# Patient Record
Sex: Female | Born: 1945 | Hispanic: No | Marital: Single | Smoking: Former smoker
Health system: Southern US, Community
[De-identification: ages and names within clinical notes are randomized; demographics above are authoritative.]

## PROBLEM LIST (undated history)

## (undated) DIAGNOSIS — G8929 Other chronic pain: Secondary | ICD-10-CM

## (undated) DIAGNOSIS — F32A Depression, unspecified: Secondary | ICD-10-CM

## (undated) DIAGNOSIS — M797 Fibromyalgia: Secondary | ICD-10-CM

## (undated) DIAGNOSIS — K5790 Diverticulosis of intestine, part unspecified, without perforation or abscess without bleeding: Secondary | ICD-10-CM

## (undated) DIAGNOSIS — J45909 Unspecified asthma, uncomplicated: Secondary | ICD-10-CM

## (undated) DIAGNOSIS — M5126 Other intervertebral disc displacement, lumbar region: Secondary | ICD-10-CM

## (undated) DIAGNOSIS — E785 Hyperlipidemia, unspecified: Secondary | ICD-10-CM

## (undated) DIAGNOSIS — K219 Gastro-esophageal reflux disease without esophagitis: Secondary | ICD-10-CM

## (undated) DIAGNOSIS — R911 Solitary pulmonary nodule: Secondary | ICD-10-CM

## (undated) DIAGNOSIS — K589 Irritable bowel syndrome without diarrhea: Secondary | ICD-10-CM

## (undated) DIAGNOSIS — M1990 Unspecified osteoarthritis, unspecified site: Secondary | ICD-10-CM

## (undated) DIAGNOSIS — M4802 Spinal stenosis, cervical region: Secondary | ICD-10-CM

## (undated) DIAGNOSIS — A0472 Enterocolitis due to Clostridium difficile, not specified as recurrent: Secondary | ICD-10-CM

## (undated) DIAGNOSIS — R519 Headache, unspecified: Secondary | ICD-10-CM

## (undated) DIAGNOSIS — I2510 Atherosclerotic heart disease of native coronary artery without angina pectoris: Secondary | ICD-10-CM

## (undated) DIAGNOSIS — R51 Headache: Secondary | ICD-10-CM

## (undated) DIAGNOSIS — I10 Essential (primary) hypertension: Secondary | ICD-10-CM

## (undated) DIAGNOSIS — F329 Major depressive disorder, single episode, unspecified: Secondary | ICD-10-CM

## (undated) DIAGNOSIS — Z8619 Personal history of other infectious and parasitic diseases: Secondary | ICD-10-CM

## (undated) DIAGNOSIS — M542 Cervicalgia: Secondary | ICD-10-CM

## (undated) HISTORY — DX: Spinal stenosis, cervical region: M48.02

## (undated) HISTORY — DX: Irritable bowel syndrome, unspecified: K58.9

## (undated) HISTORY — DX: Fibromyalgia: M79.7

## (undated) HISTORY — DX: Personal history of other infectious and parasitic diseases: Z86.19

## (undated) HISTORY — DX: Headache, unspecified: R51.9

## (undated) HISTORY — DX: Other chronic pain: G89.29

## (undated) HISTORY — DX: Essential (primary) hypertension: I10

## (undated) HISTORY — DX: Enterocolitis due to Clostridium difficile, not specified as recurrent: A04.72

## (undated) HISTORY — PX: OTHER SURGICAL HISTORY: SHX169

## (undated) HISTORY — DX: Hyperlipidemia, unspecified: E78.5

## (undated) HISTORY — DX: Cervicalgia: M54.2

## (undated) HISTORY — DX: Headache: R51

## (undated) HISTORY — PX: BREAST BIOPSY: SHX20

---
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
## 1973-07-29 HISTORY — PX: CHOLECYSTECTOMY: SHX55

## 1992-07-29 HISTORY — PX: BREAST REDUCTION SURGERY: SHX8

## 2004-04-05 ENCOUNTER — Ambulatory Visit (HOSPITAL_COMMUNITY): Admission: RE | Admit: 2004-04-05 | Discharge: 2004-04-05 | Payer: Self-pay | Admitting: Preventative Medicine

## 2008-03-10 ENCOUNTER — Ambulatory Visit: Payer: Self-pay | Admitting: Internal Medicine

## 2008-03-10 DIAGNOSIS — R05 Cough: Secondary | ICD-10-CM

## 2008-03-10 DIAGNOSIS — R059 Cough, unspecified: Secondary | ICD-10-CM | POA: Insufficient documentation

## 2008-03-10 DIAGNOSIS — R51 Headache: Secondary | ICD-10-CM | POA: Insufficient documentation

## 2008-03-10 DIAGNOSIS — R519 Headache, unspecified: Secondary | ICD-10-CM | POA: Insufficient documentation

## 2008-03-10 DIAGNOSIS — I10 Essential (primary) hypertension: Secondary | ICD-10-CM | POA: Insufficient documentation

## 2008-04-05 ENCOUNTER — Ambulatory Visit: Payer: Self-pay | Admitting: Internal Medicine

## 2008-04-05 DIAGNOSIS — R0989 Other specified symptoms and signs involving the circulatory and respiratory systems: Secondary | ICD-10-CM | POA: Insufficient documentation

## 2008-04-05 DIAGNOSIS — R0609 Other forms of dyspnea: Secondary | ICD-10-CM | POA: Insufficient documentation

## 2008-04-05 LAB — CONVERTED CEMR LAB
BUN: 7 mg/dL (ref 6–23)
Calcium: 9.7 mg/dL (ref 8.4–10.5)
Eosinophils Absolute: 0.2 10*3/uL (ref 0.0–0.7)
Eosinophils Relative: 1.5 % (ref 0.0–5.0)
GFR calc Af Amer: 110 mL/min
GFR calc non Af Amer: 91 mL/min
HCT: 37.1 % (ref 36.0–46.0)
Hemoglobin: 12.6 g/dL (ref 12.0–15.0)
MCV: 85.2 fL (ref 78.0–100.0)
Monocytes Absolute: 0.8 10*3/uL (ref 0.1–1.0)
Neutro Abs: 6.8 10*3/uL (ref 1.4–7.7)
Platelets: 302 10*3/uL (ref 150–400)
Potassium: 4.3 meq/L (ref 3.5–5.1)
RDW: 12.4 % (ref 11.5–14.6)
WBC: 12.2 10*3/uL — ABNORMAL HIGH (ref 4.5–10.5)

## 2008-04-12 ENCOUNTER — Emergency Department (HOSPITAL_COMMUNITY): Admission: EM | Admit: 2008-04-12 | Discharge: 2008-04-12 | Payer: Self-pay | Admitting: Emergency Medicine

## 2008-04-12 ENCOUNTER — Telehealth (INDEPENDENT_AMBULATORY_CARE_PROVIDER_SITE_OTHER): Payer: Self-pay | Admitting: *Deleted

## 2008-04-27 ENCOUNTER — Ambulatory Visit: Payer: Self-pay | Admitting: Cardiology

## 2008-04-28 ENCOUNTER — Encounter: Payer: Self-pay | Admitting: Internal Medicine

## 2008-05-17 ENCOUNTER — Ambulatory Visit: Payer: Self-pay | Admitting: Internal Medicine

## 2008-07-07 ENCOUNTER — Ambulatory Visit: Payer: Self-pay | Admitting: Internal Medicine

## 2010-06-12 ENCOUNTER — Emergency Department (HOSPITAL_COMMUNITY): Admission: EM | Admit: 2010-06-12 | Discharge: 2010-06-12 | Payer: Self-pay | Admitting: Emergency Medicine

## 2010-06-18 ENCOUNTER — Emergency Department (HOSPITAL_COMMUNITY): Admission: EM | Admit: 2010-06-18 | Discharge: 2010-06-18 | Payer: Self-pay | Admitting: Emergency Medicine

## 2010-08-19 ENCOUNTER — Encounter: Payer: Self-pay | Admitting: Otolaryngology

## 2013-06-30 ENCOUNTER — Encounter (INDEPENDENT_AMBULATORY_CARE_PROVIDER_SITE_OTHER): Payer: Self-pay | Admitting: *Deleted

## 2013-07-15 ENCOUNTER — Other Ambulatory Visit (INDEPENDENT_AMBULATORY_CARE_PROVIDER_SITE_OTHER): Payer: Self-pay | Admitting: *Deleted

## 2013-07-15 ENCOUNTER — Telehealth (INDEPENDENT_AMBULATORY_CARE_PROVIDER_SITE_OTHER): Payer: Self-pay | Admitting: *Deleted

## 2013-07-15 ENCOUNTER — Ambulatory Visit (INDEPENDENT_AMBULATORY_CARE_PROVIDER_SITE_OTHER): Payer: Self-pay | Admitting: Internal Medicine

## 2013-07-15 ENCOUNTER — Encounter (INDEPENDENT_AMBULATORY_CARE_PROVIDER_SITE_OTHER): Payer: Self-pay | Admitting: Internal Medicine

## 2013-07-15 VITALS — BP 130/60 | HR 60 | Temp 98.9°F | Ht 65.0 in | Wt 163.7 lb

## 2013-07-15 DIAGNOSIS — Z1211 Encounter for screening for malignant neoplasm of colon: Secondary | ICD-10-CM

## 2013-07-15 DIAGNOSIS — K5900 Constipation, unspecified: Secondary | ICD-10-CM | POA: Insufficient documentation

## 2013-07-15 MED ORDER — LUBIPROSTONE 8 MCG PO CAPS: 8.0000 ug | ORAL_CAPSULE | Freq: Two times a day (BID) | ORAL | Status: DC

## 2013-07-15 NOTE — Progress Notes (Signed)
Subjective:     Patient ID: Betty Golden, female   DOB: 04/24/1946, 67 y.o.   MRN: [id number]  HPI Referred to our office by Dr. Suzy Bouchard from [facility] in [place] for constipation.  She says she has had constipation for several years.  She is due for a colonoscopy. Her last colonoscopy was 10 yrs ago by Dr. Mariane Duval in [place]. On average she has a BM about twice a week. She takes Fiber tabs for her BMs. Appetite is good. No weight loss.  Sometimes she will have an uneasiness in her upper abdomen.  No melena or bright red rectal bleeding.  A few months ago she saw blood when she wiped.,      Review of Systems No current outpatient prescriptions on file prior to visit.   No current facility-administered medications on file prior to visit.   No current outpatient prescriptions on file prior to visit.   No current facility-administered medications on file prior to visit.  Current outpatient prescriptions:acetaminophen (TYLENOL) 500 MG tablet, Take 500 mg by mouth every 6 (six) hours as needed., Disp: , Rfl: ;  albuterol (PROVENTIL HFA;VENTOLIN HFA) 108 (90 BASE) MCG/ACT inhaler, Inhale into the lungs every 6 (six) hours as needed for wheezing or shortness of breath., Disp: , Rfl: ;  Aspirin-Salicylamide-Caffeine (BC HEADACHE POWDER PO), Take by mouth., Disp: , Rfl:  budesonide-formoterol (SYMBICORT) 160-4.5 MCG/ACT inhaler, Inhale 2 puffs into the lungs 2 (two) times daily., Disp: , Rfl: ;  FLUoxetine (PROZAC) 20 MG tablet, Take 20 mg by mouth daily., Disp: , Rfl: ;  losartan-hydrochlorothiazide (HYZAAR) 100-12.5 MG per tablet, Take 1 tablet by mouth daily., Disp: , Rfl: ;  montelukast (SINGULAIR) 10 MG tablet, Take 10 mg by mouth at bedtime., Disp: , Rfl:  simvastatin (ZOCOR) 20 MG tablet, Take 20 mg by mouth daily., Disp: , Rfl:   Past Surgical History  Procedure Laterality Date  . Appendectomy    . Cholecystectomy    . Breast reduction surgery    . Tonsillectomy      Past Medical History  Diagnosis Date  . Hypertension   . High cholesterol   . Chronic headaches         Objective:   Physical Exam  Filed Vitals:   07/15/13 1439  BP: 130/60  Pulse: 60  Temp: 98.9 F (37.2 C)  Height: 5\' 5"  (1.651 m)  Weight: 163 lb 11.2 oz (74.254 kg)   Alert and oriented. Skin warm and dry. Oral mucosa is moist.   . Sclera anicteric, conjunctivae is pink. Thyroid not enlarged. No cervical lymphadenopathy. Lungs clear. Heart regular rate and rhythm.  Abdomen is soft. Bowel sounds are positive. No hepatomegaly. No abdominal masses felt. No tenderness.  No edema to lower extremities. Patient is alert and oriented.     Assessment:    Constipation. Presently taking Fiber. She is in need of a screening colonoscopy    Plan:    Colonoscopy with Dr. Karilyn Cota. Stop the [facility] powders. Amitiza BID will try.

## 2013-07-15 NOTE — Patient Instructions (Signed)
Colonoscopy with Dr. Rehman 

## 2013-07-15 NOTE — Telephone Encounter (Signed)
Patient needs movi prep 

## 2013-07-16 MED ORDER — PEG-KCL-NACL-NASULF-NA ASC-C 100 G PO SOLR: 1.0000 | Freq: Once | ORAL | Status: DC

## 2013-08-05 ENCOUNTER — Encounter (HOSPITAL_COMMUNITY): Payer: Self-pay | Admitting: Pharmacy Technician

## 2013-08-19 ENCOUNTER — Encounter (HOSPITAL_COMMUNITY): Payer: Self-pay | Admitting: *Deleted

## 2013-08-19 ENCOUNTER — Ambulatory Visit (HOSPITAL_COMMUNITY)
Admission: RE | Admit: 2013-08-19 | Discharge: 2013-08-19 | Disposition: A | Discharge status: Home / Self Care | Payer: MEDICARE | Source: Ambulatory Visit | Attending: Internal Medicine | Admitting: Internal Medicine

## 2013-08-19 ENCOUNTER — Encounter (HOSPITAL_COMMUNITY)
Admission: RE | Disposition: A | Discharge status: Home / Self Care | Payer: Self-pay | Source: Ambulatory Visit | Attending: Internal Medicine

## 2013-08-19 DIAGNOSIS — J4489 Other specified chronic obstructive pulmonary disease: Secondary | ICD-10-CM | POA: Insufficient documentation

## 2013-08-19 DIAGNOSIS — Z1211 Encounter for screening for malignant neoplasm of colon: Secondary | ICD-10-CM

## 2013-08-19 DIAGNOSIS — K644 Residual hemorrhoidal skin tags: Secondary | ICD-10-CM | POA: Insufficient documentation

## 2013-08-19 DIAGNOSIS — K648 Other hemorrhoids: Secondary | ICD-10-CM

## 2013-08-19 DIAGNOSIS — J449 Chronic obstructive pulmonary disease, unspecified: Secondary | ICD-10-CM | POA: Insufficient documentation

## 2013-08-19 DIAGNOSIS — K5730 Diverticulosis of large intestine without perforation or abscess without bleeding: Secondary | ICD-10-CM | POA: Insufficient documentation

## 2013-08-19 DIAGNOSIS — I10 Essential (primary) hypertension: Secondary | ICD-10-CM | POA: Insufficient documentation

## 2013-08-19 HISTORY — DX: Unspecified asthma, uncomplicated: J45.909

## 2013-08-19 HISTORY — PX: COLONOSCOPY: SHX5424

## 2013-08-19 SURGERY — COLONOSCOPY
Anesthesia: Moderate Sedation

## 2013-08-19 MED ORDER — MEPERIDINE HCL 50 MG/ML IJ SOLN
INTRAMUSCULAR | Status: DC | PRN
  Administered 2013-08-19 (×2): 25 mg via INTRAVENOUS

## 2013-08-19 MED ORDER — MIDAZOLAM HCL 5 MG/5ML IJ SOLN
INTRAMUSCULAR | Status: DC | PRN
  Administered 2013-08-19: 2 mg via INTRAVENOUS
  Administered 2013-08-19: 1 mg via INTRAVENOUS
  Administered 2013-08-19 (×2): 2 mg via INTRAVENOUS
  Administered 2013-08-19: 1 mg via INTRAVENOUS

## 2013-08-19 MED ORDER — STERILE WATER FOR IRRIGATION IR SOLN
Status: DC | PRN
  Administered 2013-08-19: 14:00:00

## 2013-08-19 MED ORDER — SODIUM CHLORIDE 0.9 % IV SOLN
INTRAVENOUS | Status: DC
  Administered 2013-08-19: 13:00:00 via INTRAVENOUS

## 2013-08-19 MED ORDER — MEPERIDINE HCL 50 MG/ML IJ SOLN
INTRAMUSCULAR | Status: AC
  Filled 2013-08-19: qty 1

## 2013-08-19 MED ORDER — MIDAZOLAM HCL 5 MG/5ML IJ SOLN
INTRAMUSCULAR | Status: AC
  Filled 2013-08-19: qty 10

## 2013-08-19 NOTE — Discharge Instructions (Signed)
Resume usual medications. High fiber diet. No driving for 24 hours. Next screening exam in 10 years.  Colonoscopy, Care After Refer to this sheet in the next few weeks. These instructions provide you with information on caring for yourself after your procedure. Your health care provider may also give you more specific instructions. Your treatment has been planned according to current medical practices, but problems sometimes occur. Call your health care provider if you have any problems or questions after your procedure. WHAT TO EXPECT AFTER THE PROCEDURE  After your procedure, it is typical to have the following:  A small amount of blood in your stool.  Moderate amounts of gas and mild abdominal cramping or bloating. HOME CARE INSTRUCTIONS  Do not drive, operate machinery, or sign important documents for 24 hours.  You may shower and resume your regular physical activities, but move at a slower pace for the first 24 hours.  Take frequent rest periods for the first 24 hours.  Walk around or put a warm pack on your abdomen to help reduce abdominal cramping and bloating.  Drink enough fluids to keep your urine clear or pale yellow.  You may resume your normal diet as instructed by your health care provider. Avoid heavy or fried foods that are hard to digest.  Avoid drinking alcohol for 24 hours or as instructed by your health care provider.  Only take over-the-counter or prescription medicines as directed by your health care provider.  If a tissue sample (biopsy) was taken during your procedure:  Do not take aspirin or blood thinners for 7 days, or as instructed by your health care provider.  Do not drink alcohol for 7 days, or as instructed by your health care provider.  Eat soft foods for the first 24 hours. SEEK MEDICAL CARE IF: You have persistent spotting of blood in your stool 2 3 days after the procedure. SEEK IMMEDIATE MEDICAL CARE IF:  You have more than a small  spotting of blood in your stool.  You pass large blood clots in your stool.  Your abdomen is swollen (distended).  You have nausea or vomiting.  You have a fever.  You have increasing abdominal pain that is not relieved with medicine. Document Released: 02/27/2004 Document Revised: 05/05/2013 Document Reviewed: 03/22/2013 [facility] Patient Information 2014 [place], [place].   Diverticulosis Diverticulosis is a common condition that develops when small pouches (diverticula) form in the wall of the colon. The risk of diverticulosis increases with age. It happens more often in people who eat a low-fiber diet. Most individuals with diverticulosis have no symptoms. Those individuals with symptoms usually experience abdominal pain, constipation, or loose stools (diarrhea). HOME CARE INSTRUCTIONS   Increase the amount of fiber in your diet as directed by your caregiver or dietician. This may reduce symptoms of diverticulosis.  Your caregiver may recommend taking a dietary fiber supplement.  Drink at least 6 to 8 glasses of water each day to prevent constipation.  Try not to strain when you have a bowel movement.  Your caregiver may recommend avoiding nuts and seeds to prevent complications, although this is still an uncertain benefit.  Only take over-the-counter or prescription medicines for pain, discomfort, or fever as directed by your caregiver. FOODS WITH HIGH FIBER CONTENT INCLUDE:  Fruits. Apple, peach, pear, tangerine, raisins, prunes.  Vegetables. Brussels sprouts, asparagus, broccoli, cabbage, carrot, cauliflower, romaine lettuce, spinach, summer squash, tomato, winter squash, zucchini.  Starchy Vegetables. Baked beans, kidney beans, lima beans, split peas, lentils, potatoes (with skin).  Grains. Whole wheat bread, brown rice, bran flake cereal, plain oatmeal, white rice, shredded wheat, bran muffins. SEEK IMMEDIATE MEDICAL CARE IF:   You develop increasing pain or severe  bloating.  You have an oral temperature above 102 F (38.9 C), not controlled by medicine.  You develop vomiting or bowel movements that are bloody or black. Document Released: 04/11/2004 Document Revised: 10/07/2011 Document Reviewed: 12/13/2009 [facility] Patient Information 2014 [place], [place].  Hemorrhoids Hemorrhoids are swollen veins around the rectum or anus. There are two types of hemorrhoids:   Internal hemorrhoids. These occur in the veins just inside the rectum. They may poke through to the outside and become irritated and painful.  External hemorrhoids. These occur in the veins outside the anus and can be felt as a painful swelling or hard lump near the anus. CAUSES  Pregnancy.   Obesity.   Constipation or diarrhea.   Straining to have a bowel movement.   Sitting for long periods on the toilet.  Heavy lifting or other activity that caused you to strain.  Anal intercourse. SYMPTOMS   Pain.   Anal itching or irritation.   Rectal bleeding.   Fecal leakage.   Anal swelling.   One or more lumps around the anus.  DIAGNOSIS  Your caregiver may be able to diagnose hemorrhoids by visual examination. Other examinations or tests that may be performed include:   Examination of the rectal area with a gloved hand (digital rectal exam).   Examination of anal canal using a small tube (scope).   A blood test if you have lost a significant amount of blood.  A test to look inside the colon (sigmoidoscopy or colonoscopy). TREATMENT Most hemorrhoids can be treated at home. However, if symptoms do not seem to be getting better or if you have a lot of rectal bleeding, your caregiver may perform a procedure to help make the hemorrhoids get smaller or remove them completely. Possible treatments include:   Placing a rubber band at the base of the hemorrhoid to cut off the circulation (rubber band ligation).   Injecting a chemical to shrink the hemorrhoid  (sclerotherapy).   Using a tool to burn the hemorrhoid (infrared light therapy).   Surgically removing the hemorrhoid (hemorrhoidectomy).   Stapling the hemorrhoid to block blood flow to the tissue (hemorrhoid stapling).  HOME CARE INSTRUCTIONS   Eat foods with fiber, such as whole grains, beans, nuts, fruits, and vegetables. Ask your doctor about taking products with added fiber in them (fibersupplements).  Increase fluid intake. Drink enough water and fluids to keep your urine clear or pale yellow.   Exercise regularly.   Go to the bathroom when you have the urge to have a bowel movement. Do not wait.   Avoid straining to have bowel movements.   Keep the anal area dry and clean. Use wet toilet paper or moist towelettes after a bowel movement.   Medicated creams and suppositories may be used or applied as directed.   Only take over-the-counter or prescription medicines as directed by your caregiver.   Take warm sitz baths for 15 20 minutes, 3 4 times a day to ease pain and discomfort.   Place ice packs on the hemorrhoids if they are tender and swollen. Using ice packs between sitz baths may be helpful.   Put ice in a plastic bag.   Place a towel between your skin and the bag.   Leave the ice on for 15 20 minutes, 3 4 times a day.  Do not use a donut-shaped pillow or sit on the toilet for long periods. This increases blood pooling and pain.  SEEK MEDICAL CARE IF:  You have increasing pain and swelling that is not controlled by treatment or medicine.  You have uncontrolled bleeding.  You have difficulty or you are unable to have a bowel movement.  You have pain or inflammation outside the area of the hemorrhoids. MAKE SURE YOU:  Understand these instructions.  Will watch your condition.  Will get help right away if you are not doing well or get worse. Document Released: 07/12/2000 Document Revised: 07/01/2012 Document Reviewed:  05/19/2012 [facility] Patient Information 2014 [place], [place].

## 2013-08-19 NOTE — H&P (Signed)
Betty Golden is an 68 y.o. female.   Chief Complaint: Patient is here for colonoscopy. HPI: This is a 68 year old female who is undergoing risk screening colonoscopy. Her last exam was 10 years ago. She has chronic constipation and doing very well with Amitiza. She does report an episode of hematochezia 3 weeks ago. History is negative for CRC.  Past Medical History  Diagnosis Date  . Hypertension   . High cholesterol   . Chronic headaches   . Asthma   . COPD (chronic obstructive pulmonary disease)   . Shortness of breath     SOB with exertion    Past Surgical History  Procedure Laterality Date  . Appendectomy    . Cholecystectomy    . Breast reduction surgery    . Tonsillectomy      Family History  Problem Relation Age of Onset  . Colon cancer Neg Hx    Social History:  reports that she has quit smoking. Her smoking use included Cigarettes. She has a 20 pack-year smoking history. She does not have any smokeless tobacco history on file. She reports that she does not drink alcohol or use illicit drugs.  Allergies:  Allergies  Allergen Reactions  . Sulfonamide Derivatives     Medications Prior to Admission  Medication Sig Dispense Refill  . acetaminophen (TYLENOL) 500 MG tablet Take 500 mg by mouth every 6 (six) hours as needed.      Marland Kitchen Bioflavonoid Products (ESTER C PO) Take 1 tablet by mouth daily.      . budesonide-formoterol (SYMBICORT) 160-4.5 MCG/ACT inhaler Inhale 2 puffs into the lungs 2 (two) times daily.      . calcium-vitamin D (OSCAL 500/200 D-3) 500-200 MG-UNIT per tablet Take 1 tablet by mouth daily.      Marland Kitchen FLUoxetine (PROZAC) 20 MG tablet Take 20 mg by mouth daily.      Marland Kitchen losartan-hydrochlorothiazide (HYZAAR) 100-12.5 MG per tablet Take 1 tablet by mouth daily.      Marland Kitchen lubiprostone (AMITIZA) 8 MCG capsule Take 1 capsule (8 mcg total) by mouth 2 (two) times daily with a meal.  60 capsule  2  . mometasone (NASONEX) 50 MCG/ACT nasal spray Place 1 spray into  both nostrils daily.      . montelukast (SINGULAIR) 10 MG tablet Take 10 mg by mouth at bedtime.      . Multiple Vitamin (MULTIVITAMIN WITH MINERALS) TABS tablet Take 1 tablet by mouth daily.      . peg 3350 powder (MOVIPREP) 100 G SOLR Take 1 kit (200 g total) by mouth once.  1 kit  0  . Potassium Gluconate 595 MG CAPS Take 1 capsule by mouth daily.      . simvastatin (ZOCOR) 20 MG tablet Take 20 mg by mouth daily.      Marland Kitchen albuterol (PROVENTIL HFA;VENTOLIN HFA) 108 (90 BASE) MCG/ACT inhaler Inhale into the lungs every 6 (six) hours as needed for wheezing or shortness of breath.      . Aspirin-Salicylamide-Caffeine (BC HEADACHE POWDER PO) Take by mouth.        No results found for this or any previous visit (from the past 48 hour(s)). No results found.  ROS  Blood pressure 151/76, pulse 93, temperature 97.4 F (36.3 C), temperature source Oral, resp. rate 14, height _0  (1.651 m), weight 164 lb (74.39 kg), SpO2 99.00%. Physical Exam  Constitutional: She appears well-developed and well-nourished.  HENT:  Mouth/Throat: Oropharynx is clear and moist.  Eyes: Conjunctivae are  normal. No scleral icterus.  Neck: No thyromegaly present.  Cardiovascular: Normal rate, regular rhythm and normal heart sounds.   No murmur heard. Respiratory: Effort normal and breath sounds normal.  GI: Soft. She exhibits no distension and no mass. There is no tenderness.  Musculoskeletal: She exhibits no edema.  Lymphadenopathy:    She has no cervical adenopathy.  Neurological: She is alert.  Skin: Skin is warm and dry.     Assessment/Plan Average risk screening colonoscopy. Hematochezia possibly secondary to hemorrhoids.  Tambi Thole U 08/19/2013, 1:42 PM

## 2013-08-19 NOTE — Op Note (Signed)
COLONOSCOPY PROCEDURE REPORT  PATIENT:  Betty Golden  MR#:  [id number] Birthdate:  10-27-1945, 68 y.o., female Endoscopist:  Dr. Malissa Hippo, MD Referred By:  Dr. Zachery Dauer, MD Procedure Date: 08/19/2013  Procedure:   Colonoscopy  Indications:  Patient is 68 year old African American female who is undergoing screening colonoscopy. Her last exam was 10 years ago. She does report having had hematochezia since her colonoscopy was scheduled.  Informed Consent:  The procedure and risks were reviewed with the patient and informed consent was obtained.  Medications:  Demerol 50 mg IV Versed 8 mg IV  Description of procedure:  After a digital rectal exam was performed, that colonoscope was advanced from the anus through the rectum and colon to the area of the cecum, ileocecal valve and appendiceal orifice. The cecum was deeply intubated. These structures were well-seen and photographed for the record. From the level of the cecum and ileocecal valve, the scope was slowly and cautiously withdrawn. The mucosal surfaces were carefully surveyed utilizing scope tip to flexion to facilitate fold flattening as needed. The scope was pulled down into the rectum where a thorough exam including retroflexion was performed.  Findings:   Prep excellent. Scattered diverticula throughout the colon but most of these were located at sigmoid colon. No polyps or other mucosal abnormalities noted. Normal rectal mucosa. Small hemorrhoids above and below the dentate line.   Therapeutic/Diagnostic Maneuvers Performed:  None  Complications:  None  Cecal Withdrawal Time:  8 minutes  Impression:  Examination performed to cecum. 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 Internal/external hemorrhoids.  Recommendations:  Standard instructions given. Patient will continue high fiber diet and Amitiza as before. Next screening exam in 10 years.  REHMAN,NAJEEB U   08/19/2013 2:24 PM  CC: Dr. Zachery Dauer, MD & Dr. Bonnetta Barry ref. provider found

## 2013-08-24 ENCOUNTER — Encounter (HOSPITAL_COMMUNITY): Payer: Self-pay | Admitting: Internal Medicine

## 2015-01-17 ENCOUNTER — Other Ambulatory Visit (HOSPITAL_COMMUNITY): Payer: Self-pay | Admitting: Allergy and Immunology

## 2015-01-17 ENCOUNTER — Ambulatory Visit (HOSPITAL_COMMUNITY)
Admission: RE | Admit: 2015-01-17 | Discharge: 2015-01-17 | Disposition: A | Discharge status: Home / Self Care | Payer: Medicare Other | Source: Ambulatory Visit | Attending: Allergy and Immunology | Admitting: Allergy and Immunology

## 2015-01-17 DIAGNOSIS — R05 Cough: Secondary | ICD-10-CM

## 2015-01-17 DIAGNOSIS — J45909 Unspecified asthma, uncomplicated: Secondary | ICD-10-CM

## 2015-01-17 DIAGNOSIS — R059 Cough, unspecified: Secondary | ICD-10-CM

## 2015-01-17 DIAGNOSIS — Z87891 Personal history of nicotine dependence: Secondary | ICD-10-CM

## 2015-02-10 ENCOUNTER — Emergency Department (HOSPITAL_COMMUNITY): Payer: Medicare Other

## 2015-02-10 ENCOUNTER — Encounter (HOSPITAL_COMMUNITY): Payer: Self-pay | Admitting: *Deleted

## 2015-02-10 ENCOUNTER — Inpatient Hospital Stay (HOSPITAL_COMMUNITY)
Admission: EM | Admit: 2015-02-10 | Discharge: 2015-02-15 | DRG: 372 | Disposition: A | Discharge status: Home / Self Care | Payer: Medicare Other | Attending: Internal Medicine | Admitting: Internal Medicine

## 2015-02-10 DIAGNOSIS — R109 Unspecified abdominal pain: Secondary | ICD-10-CM | POA: Diagnosis present

## 2015-02-10 DIAGNOSIS — E871 Hypo-osmolality and hyponatremia: Secondary | ICD-10-CM | POA: Diagnosis present

## 2015-02-10 DIAGNOSIS — Z7951 Long term (current) use of inhaled steroids: Secondary | ICD-10-CM

## 2015-02-10 DIAGNOSIS — D649 Anemia, unspecified: Secondary | ICD-10-CM | POA: Diagnosis present

## 2015-02-10 DIAGNOSIS — K529 Noninfective gastroenteritis and colitis, unspecified: Secondary | ICD-10-CM

## 2015-02-10 DIAGNOSIS — A0472 Enterocolitis due to Clostridium difficile, not specified as recurrent: Secondary | ICD-10-CM | POA: Diagnosis present

## 2015-02-10 DIAGNOSIS — A047 Enterocolitis due to Clostridium difficile: Principal | ICD-10-CM | POA: Diagnosis present

## 2015-02-10 DIAGNOSIS — E860 Dehydration: Secondary | ICD-10-CM | POA: Diagnosis present

## 2015-02-10 DIAGNOSIS — Z87891 Personal history of nicotine dependence: Secondary | ICD-10-CM

## 2015-02-10 DIAGNOSIS — N179 Acute kidney failure, unspecified: Secondary | ICD-10-CM | POA: Diagnosis present

## 2015-02-10 DIAGNOSIS — R112 Nausea with vomiting, unspecified: Secondary | ICD-10-CM | POA: Diagnosis present

## 2015-02-10 DIAGNOSIS — J449 Chronic obstructive pulmonary disease, unspecified: Secondary | ICD-10-CM | POA: Diagnosis present

## 2015-02-10 DIAGNOSIS — R0902 Hypoxemia: Secondary | ICD-10-CM | POA: Diagnosis present

## 2015-02-10 DIAGNOSIS — I10 Essential (primary) hypertension: Secondary | ICD-10-CM | POA: Diagnosis present

## 2015-02-10 DIAGNOSIS — R739 Hyperglycemia, unspecified: Secondary | ICD-10-CM | POA: Diagnosis present

## 2015-02-10 DIAGNOSIS — J410 Simple chronic bronchitis: Secondary | ICD-10-CM

## 2015-02-10 DIAGNOSIS — A09 Infectious gastroenteritis and colitis, unspecified: Secondary | ICD-10-CM

## 2015-02-10 DIAGNOSIS — E876 Hypokalemia: Secondary | ICD-10-CM | POA: Diagnosis present

## 2015-02-10 DIAGNOSIS — E780 Pure hypercholesterolemia: Secondary | ICD-10-CM | POA: Diagnosis present

## 2015-02-10 HISTORY — DX: Diverticulosis of intestine, part unspecified, without perforation or abscess without bleeding: K57.90

## 2015-02-10 HISTORY — DX: Solitary pulmonary nodule: R91.1

## 2015-02-10 HISTORY — DX: Enterocolitis due to Clostridium difficile, not specified as recurrent: A04.72

## 2015-02-10 LAB — COMPREHENSIVE METABOLIC PANEL
ALBUMIN: 3.9 g/dL (ref 3.5–5.0)
ALK PHOS: 56 U/L (ref 38–126)
ALT: 15 U/L (ref 14–54)
AST: 22 U/L (ref 15–41)
Anion gap: 12 (ref 5–15)
BUN: 13 mg/dL (ref 6–20)
CHLORIDE: 98 mmol/L — AB (ref 101–111)
CO2: 24 mmol/L (ref 22–32)
CREATININE: 1.12 mg/dL — AB (ref 0.44–1.00)
Calcium: 8.9 mg/dL (ref 8.9–10.3)
GFR calc non Af Amer: 50 mL/min — ABNORMAL LOW (ref >60)
GFR, EST AFRICAN AMERICAN: 58 mL/min — AB (ref >60)
Glucose, Bld: 134 mg/dL — ABNORMAL HIGH (ref 65–99)
Potassium: 3.4 mmol/L — ABNORMAL LOW (ref 3.5–5.1)
SODIUM: 134 mmol/L — AB (ref 135–145)
TOTAL PROTEIN: 8 g/dL (ref 6.5–8.1)
Total Bilirubin: 0.8 mg/dL (ref 0.3–1.2)

## 2015-02-10 LAB — CBC WITH DIFFERENTIAL/PLATELET
BASOS ABS: 0 10*3/uL (ref 0.0–0.1)
Basophils Relative: 0 % (ref 0–1)
EOS ABS: 0 10*3/uL (ref 0.0–0.7)
Eosinophils Relative: 0 % (ref 0–5)
HEMATOCRIT: 38.5 % (ref 36.0–46.0)
Hemoglobin: 12.6 g/dL (ref 12.0–15.0)
Lymphocytes Relative: 14 % (ref 12–46)
Lymphs Abs: 2.6 10*3/uL (ref 0.7–4.0)
MCH: 27.6 pg (ref 26.0–34.0)
MCHC: 32.7 g/dL (ref 30.0–36.0)
MCV: 84.2 fL (ref 78.0–100.0)
MONO ABS: 0.5 10*3/uL (ref 0.1–1.0)
MONOS PCT: 2 % — AB (ref 3–12)
NEUTROS ABS: 16 10*3/uL — AB (ref 1.7–7.7)
Neutrophils Relative %: 84 % — ABNORMAL HIGH (ref 43–77)
Platelets: 237 10*3/uL (ref 150–400)
RBC: 4.57 MIL/uL (ref 3.87–5.11)
RDW: 13.6 % (ref 11.5–15.5)
WBC: 19.1 10*3/uL — ABNORMAL HIGH (ref 4.0–10.5)

## 2015-02-10 LAB — URINALYSIS, ROUTINE W REFLEX MICROSCOPIC
Bilirubin Urine: NEGATIVE
Glucose, UA: NEGATIVE mg/dL
Leukocytes, UA: NEGATIVE
NITRITE: NEGATIVE
PROTEIN: 30 mg/dL — AB
Urobilinogen, UA: 0.2 mg/dL (ref 0.0–1.0)
pH: 6 (ref 5.0–8.0)

## 2015-02-10 LAB — CLOSTRIDIUM DIFFICILE BY PCR: CDIFFPCR: POSITIVE — AB

## 2015-02-10 LAB — URINE MICROSCOPIC-ADD ON

## 2015-02-10 LAB — TSH: TSH: 3.162 u[IU]/mL (ref 0.350–4.500)

## 2015-02-10 LAB — LIPASE, BLOOD: Lipase: 16 U/L — ABNORMAL LOW (ref 22–51)

## 2015-02-10 MED ORDER — ENOXAPARIN SODIUM 40 MG/0.4ML ~~LOC~~ SOLN
40.0000 mg | SUBCUTANEOUS | Status: DC
  Administered 2015-02-10 – 2015-02-14 (×5): 40 mg via SUBCUTANEOUS
  Filled 2015-02-10 (×5): qty 0.4

## 2015-02-10 MED ORDER — IOHEXOL 300 MG/ML  SOLN
100.0000 mL | Freq: Once | INTRAMUSCULAR | Status: AC | PRN
  Administered 2015-02-10: 100 mL via INTRAVENOUS

## 2015-02-10 MED ORDER — DEXTROSE 5 % IV SOLN
1.0000 g | Freq: Once | INTRAVENOUS | Status: AC
  Administered 2015-02-10: 1 g via INTRAVENOUS
  Filled 2015-02-10: qty 10

## 2015-02-10 MED ORDER — VANCOMYCIN 50 MG/ML ORAL SOLUTION
125.0000 mg | Freq: Four times a day (QID) | ORAL | Status: DC
  Administered 2015-02-11 – 2015-02-15 (×18): 125 mg via ORAL
  Filled 2015-02-10 (×19): qty 2.5

## 2015-02-10 MED ORDER — POTASSIUM CHLORIDE 2 MEQ/ML IV SOLN
INTRAVENOUS | Status: DC
Start: 2015-02-10 — End: 2015-02-11
  Administered 2015-02-10: 18:00:00 via INTRAVENOUS
  Filled 2015-02-10 (×7): qty 1000

## 2015-02-10 MED ORDER — HYDRALAZINE HCL 20 MG/ML IJ SOLN: 5.0000 mg | INTRAMUSCULAR | Status: DC | PRN

## 2015-02-10 MED ORDER — METRONIDAZOLE IN NACL 5-0.79 MG/ML-% IV SOLN
500.0000 mg | Freq: Three times a day (TID) | INTRAVENOUS | Status: DC
  Administered 2015-02-10 – 2015-02-13 (×9): 500 mg via INTRAVENOUS
  Filled 2015-02-10 (×9): qty 100

## 2015-02-10 MED ORDER — CIPROFLOXACIN IN D5W 400 MG/200ML IV SOLN
400.0000 mg | Freq: Two times a day (BID) | INTRAVENOUS | Status: DC
  Administered 2015-02-10: 400 mg via INTRAVENOUS
  Filled 2015-02-10: qty 200

## 2015-02-10 MED ORDER — ACETAMINOPHEN 650 MG RE SUPP
650.0000 mg | Freq: Four times a day (QID) | RECTAL | Status: DC | PRN
Start: 2015-02-10 — End: 2015-02-15

## 2015-02-10 MED ORDER — BUDESONIDE-FORMOTEROL FUMARATE 160-4.5 MCG/ACT IN AERO
2.0000 | INHALATION_SPRAY | Freq: Two times a day (BID) | RESPIRATORY_TRACT | Status: DC
  Administered 2015-02-10 – 2015-02-15 (×10): 2 via RESPIRATORY_TRACT
  Filled 2015-02-10: qty 6

## 2015-02-10 MED ORDER — ALBUTEROL SULFATE (2.5 MG/3ML) 0.083% IN NEBU: 2.5000 mg | INHALATION_SOLUTION | RESPIRATORY_TRACT | Status: DC | PRN

## 2015-02-10 MED ORDER — ACETAMINOPHEN 325 MG PO TABS
650.0000 mg | ORAL_TABLET | Freq: Four times a day (QID) | ORAL | Status: DC | PRN
  Administered 2015-02-11 – 2015-02-13 (×3): 650 mg via ORAL
  Filled 2015-02-10 (×3): qty 2

## 2015-02-10 MED ORDER — SODIUM CHLORIDE 0.9 % IV SOLN
INTRAVENOUS | Status: DC
  Administered 2015-02-10: 17:00:00 via INTRAVENOUS

## 2015-02-10 MED ORDER — ONDANSETRON HCL 4 MG/2ML IJ SOLN
4.0000 mg | Freq: Four times a day (QID) | INTRAMUSCULAR | Status: DC | PRN
  Administered 2015-02-11: 4 mg via INTRAVENOUS
  Filled 2015-02-10: qty 2

## 2015-02-10 MED ORDER — ONDANSETRON HCL 4 MG PO TABS: 4.0000 mg | ORAL_TABLET | Freq: Four times a day (QID) | ORAL | Status: DC | PRN

## 2015-02-10 MED ORDER — ONDANSETRON HCL 4 MG/2ML IJ SOLN
4.0000 mg | Freq: Once | INTRAMUSCULAR | Status: AC
  Administered 2015-02-10: 4 mg via INTRAVENOUS
  Filled 2015-02-10: qty 2

## 2015-02-10 MED ORDER — HYDROMORPHONE HCL 1 MG/ML IJ SOLN
1.0000 mg | Freq: Once | INTRAMUSCULAR | Status: AC
  Administered 2015-02-10: 1 mg via INTRAVENOUS
  Filled 2015-02-10: qty 1

## 2015-02-10 MED ORDER — METRONIDAZOLE IN NACL 5-0.79 MG/ML-% IV SOLN
500.0000 mg | Freq: Once | INTRAVENOUS | Status: AC
  Administered 2015-02-10: 500 mg via INTRAVENOUS
  Filled 2015-02-10: qty 100

## 2015-02-10 MED ORDER — SODIUM CHLORIDE 0.9 % IV BOLUS (SEPSIS)
1000.0000 mL | Freq: Once | INTRAVENOUS | Status: AC
  Administered 2015-02-10: 1000 mL via INTRAVENOUS

## 2015-02-10 MED ORDER — MORPHINE SULFATE 2 MG/ML IJ SOLN
1.0000 mg | INTRAMUSCULAR | Status: DC | PRN
  Administered 2015-02-10 – 2015-02-11 (×6): 1 mg via INTRAVENOUS
  Filled 2015-02-10 (×6): qty 1

## 2015-02-10 NOTE — H&P (Signed)
[facility] History and Physical  CELECIA HOHLT JOI:[id number] DOB: 1945/10/16 DOA: 02/10/2015  Referring physician: Bebe Shaggy PCP: Zachery Dauer, MD   Chief Complaint: abdominal pain/nausea/vomiting  HPI: DRAKE CRANMER is a 69 y.o. female with past medical hx including HTN, copd chronic headaches presents to the emergency department with sudden onset abdominal pain persistent nausea and vomiting. Initial evaluation reveals colitis with edema in the colonic wall, acute renal failure and hypoxia.  Patient reports she was in her usual state of health until 6 AM yesterday morning when she developed sudden onset epigastric pain. She describes the pain as sharp constant and rates it a 10 out of 10 at its worst. Associated symptoms include persistent nausea with vomiting and intermittent diarrhea.. She reports having vomited "countless" times since yesterday morning. She denies any coffee ground emesis. She reports mostly yellowish green and liquid. She denies any bright red blood per rectum or melanoma. Last BM was earlier this morning prior to presentation. He reports having similar experience in 1995 and she was told she had an infection in her stomach. She denies chest pain palpitations shortness of breath fever chills dysuria hematuria frequency or urgency. He denies any headache dizziness syncope or near-syncope. Workup in the emergency department reveals a leukocytosis of 19.1o nature near 134 hypokalemia 3.4 chloride 98 creatinine 1.12 serum glucose 134. Chest x-ray reveals clear lungs and unremarkable bowel gas pattern. CT of the abdomen pelvis reveals colitis involving ascending and proximal transverse colon most likely infectious or inflammatory versus ischemic, diverticulosis and stable benign left lower lobe nodule.  In the emergency department she is provided with Rocephin 1 g, Flagyl 500 mg intravenously, Zofran and Dilaudid. Is given 1 L of fluids   Review of Systems:  10  point review of systems complete and all systems are negative except as indicated in the history of present illness  Past Medical History  Diagnosis Date  . Hypertension   . High cholesterol   . Chronic headaches   . Asthma   . COPD (chronic obstructive pulmonary disease)   . Shortness of breath     SOB with exertion  . Colitis     01/2015  . Diverticulosis   . Lung nodule     per CT stable/benign   Past Surgical History  Procedure Laterality Date  . Appendectomy    . Cholecystectomy    . Breast reduction surgery    . Tonsillectomy    . Colonoscopy N/A 08/19/2013    Procedure: COLONOSCOPY;  Surgeon: Malissa Hippo, MD;  Location: [facility];  Service: Endoscopy;  Laterality: N/A;  155-moved to 140 Ann to notify pt   Social History:  reports that she has quit smoking. Her smoking use included Cigarettes. She has a 20 pack-year smoking history. She does not have any smokeless tobacco history on file. She reports that she does not drink alcohol or use illicit drugs. Lives at home alone she is a retired Patent examiner. He is independent with ADLs Allergies  Allergen Reactions  . Sulfa Antibiotics Rash    Family History  Problem Relation Age of Onset  . Colon cancer Neg Hx      Prior to Admission medications   Medication Sig Start Date End Date Taking? Authorizing Provider  Azelastine HCl 0.15 % SOLN Place 2 sprays into the nose daily. For stuffy nose or dainage 11/08/14  Yes Historical Provider, MD  budesonide-formoterol (SYMBICORT) 160-4.5 MCG/ACT inhaler Inhale 2 puffs into the lungs 2 (two) times  daily.   Yes Historical Provider, MD  calcium-vitamin D (OSCAL 500/200 D-3) 500-200 MG-UNIT per tablet Take 1 tablet by mouth daily.   Yes Historical Provider, MD  fexofenadine (ALLEGRA) 180 MG tablet Take 180 mg by mouth daily.   Yes Historical Provider, MD  FLUoxetine (PROZAC) 20 MG capsule Take 20 mg by mouth daily.   Yes Historical Provider, MD  losartan-hydrochlorothiazide  (HYZAAR) 100-12.5 MG per tablet Take 1 tablet by mouth daily.   Yes Historical Provider, MD  Multiple Vitamin (MULTIVITAMIN WITH MINERALS) TABS tablet Take 1 tablet by mouth daily.   Yes Historical Provider, MD  acetaminophen (TYLENOL) 500 MG tablet Take 500 mg by mouth every 6 (six) hours as needed for mild pain.     Historical Provider, MD  mometasone (NASONEX) 50 MCG/ACT nasal spray Place 1 spray into both nostrils daily.    Historical Provider, MD  montelukast (SINGULAIR) 10 MG tablet Take 10 mg by mouth at bedtime.    Historical Provider, MD  PROAIR RESPICLICK 108 (90 BASE) MCG/ACT AEPB Inhale 2 puffs into the lungs every 4 (four) hours as needed (cough/wheezing).  11/09/14   Historical Provider, MD  simvastatin (ZOCOR) 20 MG tablet Take 20 mg by mouth every evening.     Historical Provider, MD   Physical Exam: Filed Vitals:   02/10/15 1232 02/10/15 1330 02/10/15 1400 02/10/15 1430  BP: 130/58 148/60 144/64 137/58  Pulse: 90 89 90 89  Temp:      TempSrc:      Resp: 14 14 14 18   Height:      Weight:      SpO2: 100% 94% 96% 95%    Wt Readings from Last 3 Encounters:  02/10/15 74.39 kg (164 lb)  08/19/13 74.39 kg (164 lb)  07/15/13 74.254 kg (163 lb 11.2 oz)    General:  Appears somewhat uncomfortable but calm calm  Eyes: PERRL, normal lids, irises & conjunctiva ENT: grossly normal hearing, he does membranes of her mouth are pink but dry Neck: no LAD, masses or thyromega Cardiovascular: RRR, no m/r/g. No LE edema. Respiratory: CTA bilaterally, no w/r/r. Normal respiratory effort. Abdomen:  Slightly distended but soft moderate to severe tenderness upper quadrants particularly very diminished bowel sounds no rebound or guarding Skin: no rash or induration seen on limited exam Musculoskeletal: grossly normal tone BUE/BLE Psychiatric: grossly normal mood and affect, speech fluent and appropriate Neurologic: grossly non-focal. Speech clear facial symmetry           Labs on  Admission:  Basic Metabolic Panel:  Recent Labs Lab 02/10/15 1007  NA 134*  K 3.4*  CL 98*  CO2 24  GLUCOSE 134*  BUN 13  CREATININE 1.12*  CALCIUM 8.9   Liver Function Tests:  Recent Labs Lab 02/10/15 1007  AST 22  ALT 15  ALKPHOS 56  BILITOT 0.8  PROT 8.0  ALBUMIN 3.9    Recent Labs Lab 02/10/15 1007  LIPASE 16*   No results for input(s): AMMONIA in the last 168 hours. CBC:  Recent Labs Lab 02/10/15 1007  WBC 19.1*  NEUTROABS 16.0*  HGB 12.6  HCT 38.5  MCV 84.2  PLT 237   Cardiac Enzymes: No results for input(s): CKTOTAL, CKMB, CKMBINDEX, TROPONINI in the last 168 hours.  BNP (last 3 results) No results for input(s): BNP in the last 8760 hours.  ProBNP (last 3 results) No results for input(s): PROBNP in the last 8760 hours.  CBG: No results for input(s): GLUCAP in the last  168 hours.  Radiological Exams on Admission: Ct Abdomen Pelvis W Contrast  02/10/2015   CLINICAL DATA:  Right-sided abdominal pain. Nausea, vomiting since 02/09/2015. Previous appendectomy, cholecystectomy.  EXAM: CT ABDOMEN AND PELVIS WITH CONTRAST  TECHNIQUE: Multidetector CT imaging of the abdomen and pelvis was performed using the standard protocol following bolus administration of intravenous contrast.  CONTRAST:  100 cc Omnipaque 350  COMPARISON:  04/05/2004  FINDINGS: Lower chest: Within the left lung base there is a 6 mm nodule. Nodule appears stable consistent with scarring or prior granulomatous change. No pleural effusions or consolidations are identified. Heart size is normal. No imaged pericardial effusion or significant coronary artery calcifications.  Upper abdomen: No focal abnormality identified within the liver, spleen, pancreas M all, or adrenal glands. Benign cyst involving the upper pole the right kidney in measures 1.5 cm. Left kidney has a normal appearance. There is no hydronephrosis. Gallbladder is surgically absent.  Gastrointestinal tract: The stomach and  small bowel loops are normal in appearance. 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 There is mild edema of the colonic wall, involving the ascending and proximal transverse colon more than other segments but all segments appear mildly involved. There are numerous colonic diverticula in the sigmoid colon but no focal diverticulitis identified.  Pelvis: The uterus is present. There is no adnexal mass or free pelvic fluid.  Retroperitoneum: There is atherosclerotic calcification of the aorta. No aneurysm. No retroperitoneal or mesenteric adenopathy. No move  Abdominal wall: Unremarkable.  Osseous structures: Degenerative changes are seen in the lower thoracic and lumbar spine. No suspicious lytic or blastic lesions are identified.  IMPRESSION: 1. Diffuse edema of the colonic wall consistent with colitis, involving the ascending and proximal transverse colon to a greater extent than the remainder of the colonic loops. 2. Considerations include infectious or inflammatory etiologies. Ischemic colitis would be much less likely. 3. Diverticulosis.  No acute diverticulitis. 4. Status post appendectomy, cholecystectomy. 5. Stable, benign left lower lobe nodule, 6 mm.   Electronically Signed   By: Norva Pavlov M.D.   On: 02/10/2015 13:02   Dg Abd Acute W/chest  02/10/2015   CLINICAL DATA:  Generalized abdominal pain, vomiting, and diarrhea for 2 days.  EXAM: DG ABDOMEN ACUTE W/ 1V CHEST  COMPARISON:  Chest radiograph on 01/17/2015  FINDINGS: There is no evidence of dilated bowel loops or free intraperitoneal air. No radiopaque calculi or other significant radiographic abnormality is seen.  Heart size and mediastinal contours are within normal limits. Both lungs are clear.  IMPRESSION: Unremarkable bowel gas pattern.  No active cardiopulmonary disease.   Electronically Signed   By: Myles Rosenthal M.D.   On: 02/10/2015 11:11    EKG: Independently reviewed sinus tachycardia  Assessment/Plan Principal Problem:    Colitis: Per CT. Patient with history of same in 1995. Will admit to medical floor. Will provide supportive therapy in the form of bowel rest IV fluids and analgesia and anti-emetic. Provide Flagyl and Cipro as well. Monitor her vital signs closely. Abdomen is somewhat distended she does continue to dry heave in the emergency department consider NG tube. Active Problems: Acute renal failure: Clearly related to dehydration associated with decreased oral intake and GI losses. Will provide IV fluids. supportive therapy as noted above. Hold any nephrotoxins. Monitor urine output. Recheck in the morning. If no improvement consider renal ultrasound    Hypoxia; patient with a history of smoking and COPD. She's not on oxygen at home. Likely related to frequent vomiting. X-ray  shows no infiltrate and lungs. Continue oxygen supplementation. Will wean as able  Abdominal pain: See #1.   Nausea and vomiting: Zofran as needed. Consider NG tube if no improvement      Dehydration: Related to #1. IV fluids as noted above. Will hold her HCTZ.    Hyponatremia: Related to decreased oral intake and GI losses. Mild at this point. IV fluids as noted above. Will recheck in the morning    Hypokalemia: Mild. Related to above in the setting of HCTZ. Will hold her home HCTZ. Will recheck in the morning    Essential hypertension: Stable in the emergency department. Home medications include losartan and hydrochlorothiazide. I will hold these for now. Will provide hydralazine as needed.       Code Status: full DVT Prophylaxis: Family Communication: none present Disposition Plan: home hopefully 48 hours  Time spent: 65 minutes  [facility] Pager [phone]

## 2015-02-10 NOTE — ED Provider Notes (Signed)
CSN: [id number]     Arrival date & time 02/10/15  4315 History  This chart was scribed for Zadie Rhine, MD by Marica Otter, ED Scribe. This patient was seen in room APA07/APA07 and the patient's care was started at 10:29 AM.   Chief Complaint  Patient presents with  . Abdominal Pain   Patient is a 69 y.o. female presenting with abdominal pain. The history is provided by the patient. No language interpreter was used.  Abdominal Pain Pain location:  Epigastric Pain quality: aching, sharp and stabbing   Pain severity:  Severe Duration:  1 day Timing:  Constant Progression:  Worsening Chronicity:  New Context: awakening from sleep   Relieved by:  Nothing Associated symptoms: diarrhea, nausea and vomiting   Associated symptoms: no chest pain, no chills, no dysuria, no fever, no hematuria and no shortness of breath   Diarrhea:    Duration:  1 day   Timing:  Intermittent Nausea:    Duration:  1 day   Timing:  Intermittent  PCP: Suzy Bouchard T, MD HPI Comments: Betty Golden is a 69 y.o. female, directed to the ED by urgent care, with PMHx noted below including appendectomy and cholecystectomy, who presents to the Emergency Department complaining of atraumatic, sudden onset, constant, worsening, 10/10 abd pain with associated n/v/d onset yesterday morning. Pt's last episode of vomiting was at 7AM today; last episode of diarrhea was PTA to the ED. Pt reports she had identical Sx once before in 1995 when she was Dx with "bacteria in my abd." Pt denies blood in vomit, chest pain, SOB, dysuria, fever, use of blood thinners, or any other Sx at this time. Pt's last colonoscopy was completed on 08/19/13.   Past Medical History  Diagnosis Date  . Hypertension   . High cholesterol   . Chronic headaches   . Asthma   . COPD (chronic obstructive pulmonary disease)   . Shortness of breath     SOB with exertion   Past Surgical History  Procedure Laterality Date  . Appendectomy    .  Cholecystectomy    . Breast reduction surgery    . Tonsillectomy    . Colonoscopy N/A 08/19/2013    Procedure: COLONOSCOPY;  Surgeon: Malissa Hippo, MD;  Location: [facility];  Service: Endoscopy;  Laterality: N/A;  155-moved to 140 Ann to notify pt   Family History  Problem Relation Age of Onset  . Colon cancer Neg Hx    History  Substance Use Topics  . Smoking status: Former Smoker -- 2.00 packs/day for 10 years    Types: Cigarettes  . Smokeless tobacco: Not on file     Comment: quit 1990  . Alcohol Use: No     Comment: stopped in 1990   OB History    No data available     Review of Systems  Constitutional: Negative for fever and chills.  Respiratory: Negative for shortness of breath.   Cardiovascular: Negative for chest pain.  Gastrointestinal: Positive for nausea, vomiting, abdominal pain and diarrhea.  Genitourinary: Negative for dysuria and hematuria.  Hematological: Does not bruise/bleed easily.  All other systems reviewed and are negative.  Allergies  Sulfa antibiotics  Home Medications   Prior to Admission medications   Medication Sig Start Date End Date Taking? Authorizing Provider  Azelastine HCl 0.15 % SOLN Place 2 sprays into the nose daily. For stuffy nose or dainage 11/08/14  Yes Historical Provider, MD  budesonide-formoterol (SYMBICORT) 160-4.5 MCG/ACT inhaler Inhale 2  puffs into the lungs 2 (two) times daily.   Yes Historical Provider, MD  calcium-vitamin D (OSCAL 500/200 D-3) 500-200 MG-UNIT per tablet Take 1 tablet by mouth daily.   Yes Historical Provider, MD  fexofenadine (ALLEGRA) 180 MG tablet Take 180 mg by mouth daily.   Yes Historical Provider, MD  FLUoxetine (PROZAC) 20 MG capsule Take 20 mg by mouth daily.   Yes Historical Provider, MD  losartan-hydrochlorothiazide (HYZAAR) 100-12.5 MG per tablet Take 1 tablet by mouth daily.   Yes Historical Provider, MD  Multiple Vitamin (MULTIVITAMIN WITH MINERALS) TABS tablet Take 1 tablet by mouth  daily.   Yes Historical Provider, MD  acetaminophen (TYLENOL) 500 MG tablet Take 500 mg by mouth every 6 (six) hours as needed for mild pain.     Historical Provider, MD  lubiprostone (AMITIZA) 8 MCG capsule Take 1 capsule (8 mcg total) by mouth 2 (two) times daily with a meal. Patient not taking: Reported on 02/10/2015 07/15/13   Len Blalock, NP  mometasone (NASONEX) 50 MCG/ACT nasal spray Place 1 spray into both nostrils daily.    Historical Provider, MD  montelukast (SINGULAIR) 10 MG tablet Take 10 mg by mouth at bedtime.    Historical Provider, MD  PROAIR RESPICLICK 108 (90 BASE) MCG/ACT AEPB Inhale 2 puffs into the lungs every 4 (four) hours as needed (cough/wheezing).  11/09/14   Historical Provider, MD  simvastatin (ZOCOR) 20 MG tablet Take 20 mg by mouth every evening.     Historical Provider, MD   Triage Vitals: BP 147/77 mmHg  Pulse 114  Temp(Src) 98.4 F (36.9 C) (Oral)  Resp 16  Ht 5\' 6"  (1.676 m)  Wt 164 lb (74.39 kg)  BMI 26.48 kg/m2  SpO2 100% Physical Exam CONSTITUTIONAL: Well developed/well nourished, uncomfortable appearing HEAD: Normocephalic/atraumatic EYES: EOMI/PERRL ENMT: Mucous membranes moist NECK: supple no meningeal signs SPINE/BACK:entire spine nontender CV: S1/S2 noted, no murmurs/rubs/gallops noted LUNGS: Lungs are clear to auscultation bilaterally, no apparent distress ABDOMEN: soft, distended, severe tenderness to epigastric region, decreaed bowel sounds noted, no rebound or guarding  GU:no cva tenderness NEURO: Pt is awake/alert/appropriate, moves all extremitiesx4.  No facial droop.   EXTREMITIES: pulses normal/equal, full ROM SKIN: warm, color normal PSYCH: no abnormalities of mood noted, alert and oriented to situation  ED Course  Procedures (including critical care time) DIAGNOSTIC STUDIES: Oxygen Saturation is 100% on RA, nl by my interpretation.    COORDINATION OF CARE: 10:32 AM-Discussed treatment plan which includes labs and imaging  with pt at bedside and pt agreed to plan.  1:55 PM Pt feeling improved Currently given her ABX She would like to go home  Will try PO challenge 2:51 PM Pt vomited and had increased abd pain Will admit D/w dr Kerry Hough, admit to medical service Pt agreeable Likely infectious colitis, doubt ischemic colitis at this time   Labs Review Labs Reviewed  COMPREHENSIVE METABOLIC PANEL - Abnormal; Notable for the following:    Sodium 134 (*)    Potassium 3.4 (*)    Chloride 98 (*)    Glucose, Bld 134 (*)    Creatinine, Ser 1.12 (*)    GFR calc non Af Amer 50 (*)    GFR calc Af Amer 58 (*)    All other components within normal limits  CBC WITH DIFFERENTIAL/PLATELET - Abnormal; Notable for the following:    WBC 19.1 (*)    Neutrophils Relative % 84 (*)    Neutro Abs 16.0 (*)    Monocytes Relative  2 (*)    All other components within normal limits  LIPASE, BLOOD - Abnormal; Notable for the following:    Lipase 16 (*)    All other components within normal limits  URINALYSIS, ROUTINE W REFLEX MICROSCOPIC (NOT AT [facility])    Imaging Review Ct Abdomen Pelvis W Contrast  02/10/2015   CLINICAL DATA:  Right-sided abdominal pain. Nausea, vomiting since 02/09/2015. Previous appendectomy, cholecystectomy.  EXAM: CT ABDOMEN AND PELVIS WITH CONTRAST  TECHNIQUE: Multidetector CT imaging of the abdomen and pelvis was performed using the standard protocol following bolus administration of intravenous contrast.  CONTRAST:  100 cc Omnipaque 350  COMPARISON:  04/05/2004  FINDINGS: Lower chest: Within the left lung base there is a 6 mm nodule. Nodule appears stable consistent with scarring or prior granulomatous change. No pleural effusions or consolidations are identified. Heart size is normal. No imaged pericardial effusion or significant coronary artery calcifications.  Upper abdomen: No focal abnormality identified within the liver, spleen, pancreas M all, or adrenal glands. Benign cyst involving the upper pole  the right kidney in measures 1.5 cm. Left kidney has a normal appearance. There is no hydronephrosis. Gallbladder is surgically absent.  Gastrointestinal tract: The stomach and small bowel loops are normal in appearance. 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 There is mild edema of the colonic wall, involving the ascending and proximal transverse colon more than other segments but all segments appear mildly involved. There are numerous colonic diverticula in the sigmoid colon but no focal diverticulitis identified.  Pelvis: The uterus is present. There is no adnexal mass or free pelvic fluid.  Retroperitoneum: There is atherosclerotic calcification of the aorta. No aneurysm. No retroperitoneal or mesenteric adenopathy. No move  Abdominal wall: Unremarkable.  Osseous structures: Degenerative changes are seen in the lower thoracic and lumbar spine. No suspicious lytic or blastic lesions are identified.  IMPRESSION: 1. Diffuse edema of the colonic wall consistent with colitis, involving the ascending and proximal transverse colon to a greater extent than the remainder of the colonic loops. 2. Considerations include infectious or inflammatory etiologies. Ischemic colitis would be much less likely. 3. Diverticulosis.  No acute diverticulitis. 4. Status post appendectomy, cholecystectomy. 5. Stable, benign left lower lobe nodule, 6 mm.   Electronically Signed   By: Norva Pavlov M.D.   On: 02/10/2015 13:02   Dg Abd Acute W/chest  02/10/2015   CLINICAL DATA:  Generalized abdominal pain, vomiting, and diarrhea for 2 days.  EXAM: DG ABDOMEN ACUTE W/ 1V CHEST  COMPARISON:  Chest radiograph on 01/17/2015  FINDINGS: There is no evidence of dilated bowel loops or free intraperitoneal air. No radiopaque calculi or other significant radiographic abnormality is seen.  Heart size and mediastinal contours are within normal limits. Both lungs are clear.  IMPRESSION: Unremarkable bowel gas pattern.  No active cardiopulmonary  disease.   Electronically Signed   By: Myles Rosenthal M.D.   On: 02/10/2015 11:11     EKG Interpretation   Date/Time:  Friday February 10 2015 10:15:49 EDT Ventricular Rate:  106 PR Interval:  170 QRS Duration: 88 QT Interval:  339 QTC Calculation: 450 R Axis:   32 Text Interpretation:  Sinus tachycardia ECG OTHERWISE WITHIN NORMAL LIMITS  No previous ECGs available Confirmed by Bebe Shaggy  MD, Kaysee Hergert ([id number]) on  02/10/2015 10:39:04 AM     Medications  cefTRIAXone (ROCEPHIN) 1 g in dextrose 5 % 50 mL IVPB (not administered)  HYDROmorphone (DILAUDID) injection 1 mg (not administered)  HYDROmorphone (DILAUDID) injection  1 mg (1 mg Intravenous Given 02/10/15 1038)  ondansetron (ZOFRAN) injection 4 mg (4 mg Intravenous Given 02/10/15 1036)  sodium chloride 0.9 % bolus 1,000 mL (0 mLs Intravenous Stopped 02/10/15 1141)  HYDROmorphone (DILAUDID) injection 1 mg (1 mg Intravenous Given 02/10/15 1141)  iohexol (OMNIPAQUE) 300 MG/ML solution 100 mL (100 mLs Intravenous Contrast Given 02/10/15 1323)  ondansetron (ZOFRAN) injection 4 mg (4 mg Intravenous Given 02/10/15 1311)  metroNIDAZOLE (FLAGYL) IVPB 500 mg (500 mg Intravenous New Bag/Given 02/10/15 1313)  ondansetron (ZOFRAN) injection 4 mg (4 mg Intravenous Given 02/10/15 1436)    MDM   Final diagnoses:  Infectious colitis    Nursing notes including past medical history and social history reviewed and considered in documentation Labs/vital reviewed myself and considered during evaluation    I personally performed the services described in this documentation, which was scribed in my presence. The recorded information has been reviewed and is accurate.      Zadie Rhine, MD 02/10/15 1452

## 2015-02-10 NOTE — ED Notes (Signed)
Pt vomited x 1. MD notified, orders obtained.

## 2015-02-10 NOTE — ED Notes (Signed)
Pt given water encouraged to drink small sips

## 2015-02-10 NOTE — ED Notes (Addendum)
Pt states abdominal pain, vomiting and diarrhea began Thursday morning. Last vomited at 0700. Last episode of diarrhea was just PTA. Sent from Urgent care to r/o acute abdomen. Unable to hold down fluids.

## 2015-02-11 LAB — COMPREHENSIVE METABOLIC PANEL
ALT: 20 U/L (ref 14–54)
AST: 35 U/L (ref 15–41)
Albumin: 3.5 g/dL (ref 3.5–5.0)
Alkaline Phosphatase: 50 U/L (ref 38–126)
Anion gap: 9 (ref 5–15)
BILIRUBIN TOTAL: 0.5 mg/dL (ref 0.3–1.2)
BUN: 10 mg/dL (ref 6–20)
CO2: 23 mmol/L (ref 22–32)
CREATININE: 0.87 mg/dL (ref 0.44–1.00)
Calcium: 8.2 mg/dL — ABNORMAL LOW (ref 8.9–10.3)
Chloride: 105 mmol/L (ref 101–111)
GFR calc Af Amer: >60 mL/min (ref >60)
Glucose, Bld: 102 mg/dL — ABNORMAL HIGH (ref 65–99)
Potassium: 3 mmol/L — ABNORMAL LOW (ref 3.5–5.1)
SODIUM: 137 mmol/L (ref 135–145)
Total Protein: 7.2 g/dL (ref 6.5–8.1)

## 2015-02-11 LAB — HEMOGLOBIN A1C
HEMOGLOBIN A1C: 5.8 % — AB (ref 4.8–5.6)
MEAN PLASMA GLUCOSE: 120 mg/dL

## 2015-02-11 LAB — CBC
HCT: 34.9 % — ABNORMAL LOW (ref 36.0–46.0)
Hemoglobin: 11.5 g/dL — ABNORMAL LOW (ref 12.0–15.0)
MCH: 27.8 pg (ref 26.0–34.0)
MCHC: 33 g/dL (ref 30.0–36.0)
MCV: 84.3 fL (ref 78.0–100.0)
Platelets: 229 10*3/uL (ref 150–400)
RBC: 4.14 MIL/uL (ref 3.87–5.11)
RDW: 13.6 % (ref 11.5–15.5)
WBC: 13.4 10*3/uL — ABNORMAL HIGH (ref 4.0–10.5)

## 2015-02-11 MED ORDER — SACCHAROMYCES BOULARDII 250 MG PO CAPS
250.0000 mg | ORAL_CAPSULE | Freq: Two times a day (BID) | ORAL | Status: DC
  Administered 2015-02-11 – 2015-02-15 (×9): 250 mg via ORAL
  Filled 2015-02-11 (×9): qty 1

## 2015-02-11 MED ORDER — VANCOMYCIN 50 MG/ML ORAL SOLUTION
ORAL | Status: AC
  Filled 2015-02-11: qty 5

## 2015-02-11 MED ORDER — POTASSIUM CHLORIDE CRYS ER 20 MEQ PO TBCR
40.0000 meq | EXTENDED_RELEASE_TABLET | ORAL | Status: AC
  Administered 2015-02-11 (×2): 40 meq via ORAL
  Filled 2015-02-11: qty 4
  Filled 2015-02-11: qty 2

## 2015-02-11 MED ORDER — POTASSIUM CHLORIDE IN NACL 40-0.9 MEQ/L-% IV SOLN
INTRAVENOUS | Status: DC
  Administered 2015-02-11 – 2015-02-13 (×4): 100 mL/h via INTRAVENOUS

## 2015-02-11 MED ORDER — HYDROMORPHONE HCL 1 MG/ML IJ SOLN
1.0000 mg | INTRAMUSCULAR | Status: DC | PRN
  Administered 2015-02-11 – 2015-02-12 (×8): 1 mg via INTRAVENOUS
  Administered 2015-02-13: 2 mg via INTRAVENOUS
  Administered 2015-02-13 (×2): 1 mg via INTRAVENOUS
  Filled 2015-02-11 (×7): qty 1
  Filled 2015-02-11: qty 2
  Filled 2015-02-11 (×5): qty 1

## 2015-02-11 NOTE — Progress Notes (Signed)
Utilization review Completed Adarius Tigges RN BSN   

## 2015-02-11 NOTE — Progress Notes (Signed)
[facility] PROGRESS NOTE  Betty Golden GYF:[id number] DOB: September 06, 1945 DOA: 02/10/2015 PCP: Zachery Dauer, MD  Assessment/Plan: 1. Clostridium difficile colitis. Patient reports taking antibiotic approximately 2 months ago. She has been started on oral vancomycin per C. difficile protocol. She's continued on intravenous Flagyl for now. She wishes to start on clear liquids. Leukocytosis is improving. Continue current treatments. Start on probiotics. 2. Acute renal failure. Related to dehydration. Improving with IV fluids. Continue current treatments. 3. Hypokalemia. Likely related to frequent stools. Replace 4. Hypertension. Patient is on losartan and hydrochlorothiazide at home. We'll continue to hold these are now in the setting of renal dysfunction. 5. Hyponatremia. Related to volume depletion. Improved with IV fluids. 6. Dehydration. Improved with IV fluids.  Code Status: full code Family Communication: Discussed with patient Disposition Plan: Discharge home once improved   Consultants:    Procedures:    Antibiotics:  Oral Vancomycin 7/15>>  Flagyl 7/15>>  HPI/Subjective: Continues to have significant abdominal pain. Still having frequent loose stools. No vomiting, but continues to have nausea.  Objective: Filed Vitals:   02/11/15 0534  BP: 133/57  Pulse: 91  Temp: 98.4 F (36.9 C)  Resp: 20    Intake/Output Summary (Last 24 hours) at 02/11/15 1012 Last data filed at 02/10/15 1803  Gross per 24 hour  Intake    200 ml  Output    100 ml  Net    100 ml   Filed Weights   02/10/15 0948 02/10/15 1543  Weight: 74.39 kg (164 lb) 75.433 kg (166 lb 4.8 oz)    Exam:   General:  Appears uncomfortable due to abdominal pain  Cardiovascular: s1, s2, rrr  Respiratory: cta b  Abdomen: soft, tender in epigastrium, bs+  Musculoskeletal: no edema b/l   Data Reviewed: Basic Metabolic Panel:  Recent Labs Lab 02/10/15 1007 02/11/15 0612  NA 134* 137   K 3.4* 3.0*  CL 98* 105  CO2 24 23  GLUCOSE 134* 102*  BUN 13 10  CREATININE 1.12* 0.87  CALCIUM 8.9 8.2*   Liver Function Tests:  Recent Labs Lab 02/10/15 1007 02/11/15 0612  AST 22 35  ALT 15 20  ALKPHOS 56 50  BILITOT 0.8 0.5  PROT 8.0 7.2  ALBUMIN 3.9 3.5    Recent Labs Lab 02/10/15 1007  LIPASE 16*   No results for input(s): AMMONIA in the last 168 hours. CBC:  Recent Labs Lab 02/10/15 1007 02/11/15 0612  WBC 19.1* 13.4*  NEUTROABS 16.0*  --   HGB 12.6 11.5*  HCT 38.5 34.9*  MCV 84.2 84.3  PLT 237 229   Cardiac Enzymes: No results for input(s): CKTOTAL, CKMB, CKMBINDEX, TROPONINI in the last 168 hours. BNP (last 3 results) No results for input(s): BNP in the last 8760 hours.  ProBNP (last 3 results) No results for input(s): PROBNP in the last 8760 hours.  CBG: No results for input(s): GLUCAP in the last 168 hours.  Recent Results (from the past 240 hour(s))  Clostridium Difficile by PCR (not at [facility])     Status: Abnormal   Collection Time: 02/10/15  7:51 PM  Result Value Ref Range Status   C difficile by pcr POSITIVE (A) NEGATIVE Final    Comment: CRITICAL RESULT CALLED TO, READ BACK BY AND VERIFIED WITH: STURDIVANT,D ON 02/10/15 AT 2145 BY LOY,C      Studies: Ct Abdomen Pelvis W Contrast  02/10/2015   CLINICAL DATA:  Right-sided abdominal pain. Nausea, vomiting since 02/09/2015. Previous appendectomy, cholecystectomy.  EXAM: CT ABDOMEN AND PELVIS WITH CONTRAST  TECHNIQUE: Multidetector CT imaging of the abdomen and pelvis was performed using the standard protocol following bolus administration of intravenous contrast.  CONTRAST:  100 cc Omnipaque 350  COMPARISON:  04/05/2004  FINDINGS: Lower chest: Within the left lung base there is a 6 mm nodule. Nodule appears stable consistent with scarring or prior granulomatous change. No pleural effusions or consolidations are identified. Heart size is normal. No imaged pericardial effusion or significant  coronary artery calcifications.  Upper abdomen: No focal abnormality identified within the liver, spleen, pancreas M all, or adrenal glands. Benign cyst involving the upper pole the right kidney in measures 1.5 cm. Left kidney has a normal appearance. There is no hydronephrosis. Gallbladder is surgically absent.  Gastrointestinal tract: The stomach and small bowel loops are normal in appearance. 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 There is mild edema of the colonic wall, involving the ascending and proximal transverse colon more than other segments but all segments appear mildly involved. There are numerous colonic diverticula in the sigmoid colon but no focal diverticulitis identified.  Pelvis: The uterus is present. There is no adnexal mass or free pelvic fluid.  Retroperitoneum: There is atherosclerotic calcification of the aorta. No aneurysm. No retroperitoneal or mesenteric adenopathy. No move  Abdominal wall: Unremarkable.  Osseous structures: Degenerative changes are seen in the lower thoracic and lumbar spine. No suspicious lytic or blastic lesions are identified.  IMPRESSION: 1. Diffuse edema of the colonic wall consistent with colitis, involving the ascending and proximal transverse colon to a greater extent than the remainder of the colonic loops. 2. Considerations include infectious or inflammatory etiologies. Ischemic colitis would be much less likely. 3. Diverticulosis.  No acute diverticulitis. 4. Status post appendectomy, cholecystectomy. 5. Stable, benign left lower lobe nodule, 6 mm.   Electronically Signed   By: Norva Pavlov M.D.   On: 02/10/2015 13:02   Dg Abd Acute W/chest  02/10/2015   CLINICAL DATA:  Generalized abdominal pain, vomiting, and diarrhea for 2 days.  EXAM: DG ABDOMEN ACUTE W/ 1V CHEST  COMPARISON:  Chest radiograph on 01/17/2015  FINDINGS: There is no evidence of dilated bowel loops or free intraperitoneal air. No radiopaque calculi or other significant radiographic  abnormality is seen.  Heart size and mediastinal contours are within normal limits. Both lungs are clear.  IMPRESSION: Unremarkable bowel gas pattern.  No active cardiopulmonary disease.   Electronically Signed   By: Myles Rosenthal M.D.   On: 02/10/2015 11:11    Scheduled Meds: . budesonide-formoterol  2 puff Inhalation BID  . enoxaparin (LOVENOX) injection  40 mg Subcutaneous Q24H  . metronidazole  500 mg Intravenous Q8H  . potassium chloride  40 mEq Oral Q3H  . saccharomyces boulardii  250 mg Oral BID  . vancomycin  125 mg Oral QID   Continuous Infusions: . 0.9 % NaCl with KCl 40 mEq / L 100 mL/hr (02/11/15 0902)    Principal Problem:   Colitis Active Problems:   Essential hypertension   Abdominal pain   Nausea and vomiting   Acute renal failure   Dehydration   Hypoxia   Hyponatremia   Hypokalemia    Time spent:    [facility]  [facility] Pager [phone]. If 7PM-7AM, please contact night-coverage at [email or web address], password [facility] 02/11/2015, 10:12 AM  LOS: 1 day

## 2015-02-11 NOTE — Progress Notes (Signed)
C-Diff confirmed.  Doctor notified.Marland Kitchen

## 2015-02-12 LAB — CBC
HEMATOCRIT: 31.6 % — AB (ref 36.0–46.0)
HEMOGLOBIN: 10.5 g/dL — AB (ref 12.0–15.0)
MCH: 28.1 pg (ref 26.0–34.0)
MCHC: 33.2 g/dL (ref 30.0–36.0)
MCV: 84.5 fL (ref 78.0–100.0)
Platelets: 213 10*3/uL (ref 150–400)
RBC: 3.74 MIL/uL — ABNORMAL LOW (ref 3.87–5.11)
RDW: 14 % (ref 11.5–15.5)
WBC: 10.3 10*3/uL (ref 4.0–10.5)

## 2015-02-12 LAB — BASIC METABOLIC PANEL
ANION GAP: 6 (ref 5–15)
BUN: 8 mg/dL (ref 6–20)
CALCIUM: 8 mg/dL — AB (ref 8.9–10.3)
CO2: 22 mmol/L (ref 22–32)
CREATININE: 0.74 mg/dL (ref 0.44–1.00)
Chloride: 110 mmol/L (ref 101–111)
GFR calc Af Amer: >60 mL/min (ref >60)
Glucose, Bld: 98 mg/dL (ref 65–99)
Potassium: 4 mmol/L (ref 3.5–5.1)
Sodium: 138 mmol/L (ref 135–145)

## 2015-02-12 NOTE — Progress Notes (Signed)
[facility] PROGRESS NOTE  Betty Golden QHU:[id number] DOB: [birth year] DOA: 02/10/2015 PCP: Zachery Dauer, MD  Assessment/Plan: 1. Clostridium difficile colitis. Patient reports taking antibiotic for upper respiratory tract infection approximately 2 months ago. She continues to have abdominal pain and diarrhea. She is on oral vancomycin per C. difficile protocol. She's continued on intravenous Flagyl for now. Leukocytosis is improving and she has not had any fever. Continue current treatments. She has not had any vomiting, so we will advance diet 2. Acute renal failure. Related to dehydration. Resolved with IV fluids. Continue current treatments. 3. Hypokalemia. Likely related to frequent stools. Replace 4. Hypertension. Patient is on losartan and hydrochlorothiazide at home. We'll continue to hold these for now in the setting of renal dysfunction. Blood pressure currently stable. 5. Hyponatremia. Related to volume depletion. Improved with IV fluids. 6. Dehydration. Improved with IV fluids.  Code Status: full code Family Communication: Discussed with patient Disposition Plan: Discharge home once improved   Consultants:    Procedures:    Antibiotics:  Oral Vancomycin 7/15>>  Flagyl 7/15>>  HPI/Subjective: Continues to complain of abdominal pain. Still having diarrhea with loose stools every hour. No vomiting.  Objective: Filed Vitals:   02/12/15 1500  BP: 125/75  Pulse: 75  Temp: 98.5 F (36.9 C)  Resp: 20    Intake/Output Summary (Last 24 hours) at 02/12/15 1717 Last data filed at 02/12/15 1500  Gross per 24 hour  Intake 3661.67 ml  Output      0 ml  Net 3661.67 ml   Filed Weights   02/10/15 0948 02/10/15 1543  Weight: 74.39 kg (164 lb) 75.433 kg (166 lb 4.8 oz)    Exam:   General:  Laying in bed,   Cardiovascular: s1, s2, regular rate  Respiratory: clear bilaterally  Abdomen: soft, tenderness in epigastrium improving,  bs+  Musculoskeletal: no edema b/l   Data Reviewed: Basic Metabolic Panel:  Recent Labs Lab 02/10/15 1007 02/11/15 0612 02/12/15 0559  NA 134* 137 138  K 3.4* 3.0* 4.0  CL 98* 105 110  CO2 24 23 22   GLUCOSE 134* 102* 98  BUN 13 10 8   CREATININE 1.12* 0.87 0.74  CALCIUM 8.9 8.2* 8.0*   Liver Function Tests:  Recent Labs Lab 02/10/15 1007 02/11/15 0612  AST 22 35  ALT 15 20  ALKPHOS 56 50  BILITOT 0.8 0.5  PROT 8.0 7.2  ALBUMIN 3.9 3.5    Recent Labs Lab 02/10/15 1007  LIPASE 16*   No results for input(s): AMMONIA in the last 168 hours. CBC:  Recent Labs Lab 02/10/15 1007 02/11/15 0612 02/12/15 0559  WBC 19.1* 13.4* 10.3  NEUTROABS 16.0*  --   --   HGB 12.6 11.5* 10.5*  HCT 38.5 34.9* 31.6*  MCV 84.2 84.3 84.5  PLT 237 229 213   Cardiac Enzymes: No results for input(s): CKTOTAL, CKMB, CKMBINDEX, TROPONINI in the last 168 hours. BNP (last 3 results) No results for input(s): BNP in the last 8760 hours.  ProBNP (last 3 results) No results for input(s): PROBNP in the last 8760 hours.  CBG: No results for input(s): GLUCAP in the last 168 hours.  Recent Results (from the past 240 hour(s))  Clostridium Difficile by PCR (not at [facility])     Status: Abnormal   Collection Time: 02/10/15  7:51 PM  Result Value Ref Range Status   C difficile by pcr POSITIVE (A) NEGATIVE Final    Comment: CRITICAL RESULT CALLED TO, READ BACK BY AND VERIFIED  WITH: STURDIVANT,D ON 02/10/15 AT 2145 BY LOY,C      Studies: No results found.  Scheduled Meds: . budesonide-formoterol  2 puff Inhalation BID  . enoxaparin (LOVENOX) injection  40 mg Subcutaneous Q24H  . metronidazole  500 mg Intravenous Q8H  . saccharomyces boulardii  250 mg Oral BID  . vancomycin  125 mg Oral QID   Continuous Infusions: . 0.9 % NaCl with KCl 40 mEq / L 100 mL/hr at 02/12/15 1500    Principal Problem:   Colitis due to Clostridium difficile Active Problems:   Essential hypertension    Abdominal pain   Nausea and vomiting   Acute renal failure   Dehydration   Hypoxia   Hyponatremia   Hypokalemia    Time spent:    [facility]  [facility] Pager [phone]. If 7PM-7AM, please contact night-coverage at [email or web address], password [facility] 02/12/2015, 5:17 PM  LOS: 2 days

## 2015-02-13 LAB — CBC
HCT: 29.7 % — ABNORMAL LOW (ref 36.0–46.0)
HEMOGLOBIN: 10 g/dL — AB (ref 12.0–15.0)
MCH: 28.2 pg (ref 26.0–34.0)
MCHC: 33.7 g/dL (ref 30.0–36.0)
MCV: 83.9 fL (ref 78.0–100.0)
Platelets: 213 10*3/uL (ref 150–400)
RBC: 3.54 MIL/uL — AB (ref 3.87–5.11)
RDW: 13.9 % (ref 11.5–15.5)
WBC: 10.1 10*3/uL (ref 4.0–10.5)

## 2015-02-13 LAB — BASIC METABOLIC PANEL
Anion gap: 6 (ref 5–15)
BUN: 5 mg/dL — ABNORMAL LOW (ref 6–20)
CO2: 21 mmol/L — AB (ref 22–32)
Calcium: 7.7 mg/dL — ABNORMAL LOW (ref 8.9–10.3)
Chloride: 110 mmol/L (ref 101–111)
Creatinine, Ser: 0.65 mg/dL (ref 0.44–1.00)
Glucose, Bld: 90 mg/dL (ref 65–99)
Potassium: 3.8 mmol/L (ref 3.5–5.1)
SODIUM: 137 mmol/L (ref 135–145)

## 2015-02-13 MED ORDER — HYDROCODONE-ACETAMINOPHEN 5-325 MG PO TABS
1.0000 | ORAL_TABLET | Freq: Four times a day (QID) | ORAL | Status: DC | PRN
  Administered 2015-02-13 – 2015-02-15 (×7): 2 via ORAL
  Filled 2015-02-13 (×7): qty 2

## 2015-02-13 NOTE — Progress Notes (Signed)
[facility] PROGRESS NOTE  Betty Golden MGQ:[id number] DOB: 05/25/1946 DOA: 02/10/2015 PCP: Zachery Dauer, MD  Assessment/Plan:  Clostridium difficile colitis. Patient reported taking antibiotic for upper respiratory tract infection approximately 2 months ago. She continues to have several loose stool but electrolytes within limits of normal. Tolerating soft diet. No abdominal pain, no nausea. Will discontinue IV.   She is on oral vancomycin per C. difficile protocol. Continue IV Flagyl for now. Leukocytosis resolved. She is afebrile. Will change pain med to oral.   Acute renal failure. Related to dehydration. Resolved with IV fluids. Continue current treatments.  Hypokalemia. Likely related to frequent stools. Resolved  Hypertension. Controlled.  Patient is on losartan and hydrochlorothiazide at home. We'll continue to hold these for now in the setting of renal dysfunction.   Hyponatremia. Resolve.  Related to volume depletion.   Dehydration. Improved with IV fluids    1. Code Status: full Family Communication: none present Disposition Plan: home hopefully tomorrow   Consultants:  none  Procedures:  none  Antibiotics:  Oral Vancomycin 7/15>>  Flagyl 7/15>>   HPI/Subjective: Getting up to go to BR. Denies abdominal pain/nausa  Objective: Filed Vitals:   02/13/15 0639  BP: 130/63  Pulse: 79  Temp: 98.7 F (37.1 C)  Resp: 20    Intake/Output Summary (Last 24 hours) at 02/13/15 1410 Last data filed at 02/13/15 0908  Gross per 24 hour  Intake 2311.67 ml  Output      0 ml  Net 2311.67 ml   Filed Weights   02/10/15 0948 02/10/15 1543  Weight: 74.39 kg (164 lb) 75.433 kg (166 lb 4.8 oz)    Exam:   General:  Well nourished   Cardiovascular: RRR no MGR no LE edema  Respiratory: normal effort BS clear bilaterally  Abdomen: non-distended mild tendnerness upper quadrant +BS  Musculoskeletal: no clubbing or cyanaoosis   Data Reviewed: Basic  Metabolic Panel:  Recent Labs Lab 02/10/15 1007 02/11/15 0612 02/12/15 0559 02/13/15 0730  NA 134* 137 138 137  K 3.4* 3.0* 4.0 3.8  CL 98* 105 110 110  CO2 24 23 22  21*  GLUCOSE 134* 102* 98 90  BUN 13 10 8  5*  CREATININE 1.12* 0.87 0.74 0.65  CALCIUM 8.9 8.2* 8.0* 7.7*   Liver Function Tests:  Recent Labs Lab 02/10/15 1007 02/11/15 0612  AST 22 35  ALT 15 20  ALKPHOS 56 50  BILITOT 0.8 0.5  PROT 8.0 7.2  ALBUMIN 3.9 3.5    Recent Labs Lab 02/10/15 1007  LIPASE 16*   No results for input(s): AMMONIA in the last 168 hours. CBC:  Recent Labs Lab 02/10/15 1007 02/11/15 0612 02/12/15 0559 02/13/15 0730  WBC 19.1* 13.4* 10.3 10.1  NEUTROABS 16.0*  --   --   --   HGB 12.6 11.5* 10.5* 10.0*  HCT 38.5 34.9* 31.6* 29.7*  MCV 84.2 84.3 84.5 83.9  PLT 237 229 213 213   Cardiac Enzymes: No results for input(s): CKTOTAL, CKMB, CKMBINDEX, TROPONINI in the last 168 hours. BNP (last 3 results) No results for input(s): BNP in the last 8760 hours.  ProBNP (last 3 results) No results for input(s): PROBNP in the last 8760 hours.  CBG: No results for input(s): GLUCAP in the last 168 hours.  Recent Results (from the past 240 hour(s))  Clostridium Difficile by PCR (not at [facility])     Status: Abnormal   Collection Time: 02/10/15  7:51 PM  Result Value Ref Range Status  C difficile by pcr POSITIVE (A) NEGATIVE Final    Comment: CRITICAL RESULT CALLED TO, READ BACK BY AND VERIFIED WITH: STURDIVANT,D ON 02/10/15 AT 2145 BY LOY,C      Studies: No results found.  Scheduled Meds: . budesonide-formoterol  2 puff Inhalation BID  . enoxaparin (LOVENOX) injection  40 mg Subcutaneous Q24H  . metronidazole  500 mg Intravenous Q8H  . saccharomyces boulardii  250 mg Oral BID  . vancomycin  125 mg Oral QID   Continuous Infusions: . 0.9 % NaCl with KCl 40 mEq / L 100 mL/hr (02/13/15 1331)    Principal Problem:   Colitis due to Clostridium difficile Active Problems:    Essential hypertension   Abdominal pain   Nausea and vomiting   Acute renal failure   Dehydration   Hypoxia   Hyponatremia   Hypokalemia    Time spent: 30 minutes    [facility] M  [facility] Pager [phone]. If 7PM-7AM, please contact night-coverage at [email or web address], password [facility] 02/13/2015, 2:10 PM  LOS: 3 days

## 2015-02-13 NOTE — Care Management Important Message (Signed)
Important Message  Patient Details  Name: Betty Golden MRN: [id number] Date of Birth: 1945-11-05   Medicare Important Message Given:  Yes-second notification given    Malcolm Metro, RN 02/13/2015, 3:49 PM

## 2015-02-13 NOTE — Care Management Note (Signed)
Case Management Note  Patient Details  Name: Betty Golden MRN: [id number] Date of Birth: April 18, 1946  Expected Discharge Date:     02/13/2015             Expected Discharge Plan:  Home/Self Care  In-House Referral:  NA  Discharge planning Services  CM Consult  Post Acute Care Choice:  NA Choice offered to:  NA  DME Arranged:    DME Agency:     HH Arranged:    HH Agency:     Status of Service:  In process, will continue to follow  Medicare Important Message Given:    Date Medicare IM Given:    Medicare IM give by:    Date Additional Medicare IM Given:    Additional Medicare Important Message give by:     If discussed at Long Length of Stay Meetings, dates discussed:    Additional Comments: Pt is from home and independent at baseline. Pt has no DME's or HH services. Pt plans to discharge home with self care. Pt will DC on PO vanc. Explained to patient she needs to go to pharmacy that can compound the liquid form of vanc. Pt plans to have Rx filled at [facility] in [place]. No further CM needs anticipated.  Malcolm Metro, RN 02/13/2015, 3:45 PM

## 2015-02-14 LAB — BASIC METABOLIC PANEL
ANION GAP: 6 (ref 5–15)
BUN: 6 mg/dL (ref 6–20)
CHLORIDE: 110 mmol/L (ref 101–111)
CO2: 26 mmol/L (ref 22–32)
CREATININE: 0.69 mg/dL (ref 0.44–1.00)
Calcium: 8.8 mg/dL — ABNORMAL LOW (ref 8.9–10.3)
GFR calc Af Amer: >60 mL/min (ref >60)
Glucose, Bld: 93 mg/dL (ref 65–99)
Potassium: 4 mmol/L (ref 3.5–5.1)
Sodium: 142 mmol/L (ref 135–145)

## 2015-02-14 LAB — CBC
HCT: 32.2 % — ABNORMAL LOW (ref 36.0–46.0)
Hemoglobin: 10.4 g/dL — ABNORMAL LOW (ref 12.0–15.0)
MCH: 27 pg (ref 26.0–34.0)
MCHC: 32.3 g/dL (ref 30.0–36.0)
MCV: 83.6 fL (ref 78.0–100.0)
Platelets: 282 10*3/uL (ref 150–400)
RBC: 3.85 MIL/uL — ABNORMAL LOW (ref 3.87–5.11)
RDW: 14.1 % (ref 11.5–15.5)
WBC: 9.2 10*3/uL (ref 4.0–10.5)

## 2015-02-14 LAB — GI PATHOGEN PANEL BY PCR, STOOL
CAMPYLOBACTER BY PCR: NOT DETECTED
Cryptosporidium by PCR: NOT DETECTED
E COLI (ETEC) LT/ST: NOT DETECTED
E COLI (STEC): NOT DETECTED
E coli 0157 by PCR: NOT DETECTED
G lamblia by PCR: NOT DETECTED
Norovirus GI/GII: NOT DETECTED
Rotavirus A by PCR: NOT DETECTED
SHIGELLA BY PCR: NOT DETECTED

## 2015-02-14 NOTE — Progress Notes (Signed)
[facility] PROGRESS NOTE  Betty Golden URK:[id number] DOB: [birth year] DOA: 02/10/2015 PCP: Zachery Dauer, MD  Assessment/Plan:  Clostridium difficile colitis. Patient reported taking antibiotic for upper respiratory tract infection approximately 2 months ago. She continues to have several loose stool but is slowing down. Electrolytes within limits of normal. Tolerating soft diet. mild abdominal pain with eating, no nausea. Some abdominal distention persisting. continue oral vancomycin per C. difficile protocol. Continue IV Flagyl for now. She remains afebrile.   Acute renal failure. Related to dehydration. Resolved with IV fluids. Continue current treatments.  Hypokalemia. Likely related to frequent stools. Resolved  Hypertension. Controlled. Patient is on losartan and hydrochlorothiazide at home. We'll continue to hold these for now in the setting of renal dysfunction.   Hyponatremia. Resolve. Related to volume depletion.   Dehydration. Improved with IV fluids   Code Status: full Family Communication: none present Disposition Plan: home hopefully tomorrow   Consultants:  none  Procedures:  none  Antibiotics:  Oral Vancomycin 7/15>>  Flagyl 7/15>>  HPI/Subjective: Sitting up in bed. Reports feeling "much better" reports only 2 stools since midnight. Reports some abdominal pain "not like other pain" after eating.   Objective: Filed Vitals:   02/14/15 0607  BP: 140/72  Pulse: 70  Temp: 97.6 F (36.4 C)  Resp: 20    Intake/Output Summary (Last 24 hours) at 02/14/15 1319 Last data filed at 02/13/15 1847  Gross per 24 hour  Intake   1680 ml  Output    550 ml  Net   1130 ml   Filed Weights   02/10/15 0948 02/10/15 1543  Weight: 74.39 kg (164 lb) 75.433 kg (166 lb 4.8 oz)    Exam:   General:  Well nourished appears comfortable  Cardiovascular: RRR no MGR no LE edema  Respiratory: normal effort BS clear bilaterally no wheeze no  rhonchi  Abdomen: slightly distended with sluggish BS. Mild tenderness upper quadrants  Musculoskeletal: joints without swelling or erythema   Data Reviewed: Basic Metabolic Panel:  Recent Labs Lab 02/10/15 1007 02/11/15 0612 02/12/15 0559 02/13/15 0730 02/14/15 0819  NA 134* 137 138 137 142  K 3.4* 3.0* 4.0 3.8 4.0  CL 98* 105 110 110 110  CO2 24 23 22  21* 26  GLUCOSE 134* 102* 98 90 93  BUN 13 10 8  5* 6  CREATININE 1.12* 0.87 0.74 0.65 0.69  CALCIUM 8.9 8.2* 8.0* 7.7* 8.8*   Liver Function Tests:  Recent Labs Lab 02/10/15 1007 02/11/15 0612  AST 22 35  ALT 15 20  ALKPHOS 56 50  BILITOT 0.8 0.5  PROT 8.0 7.2  ALBUMIN 3.9 3.5    Recent Labs Lab 02/10/15 1007  LIPASE 16*   No results for input(s): AMMONIA in the last 168 hours. CBC:  Recent Labs Lab 02/10/15 1007 02/11/15 0612 02/12/15 0559 02/13/15 0730 02/14/15 0819  WBC 19.1* 13.4* 10.3 10.1 9.2  NEUTROABS 16.0*  --   --   --   --   HGB 12.6 11.5* 10.5* 10.0* 10.4*  HCT 38.5 34.9* 31.6* 29.7* 32.2*  MCV 84.2 84.3 84.5 83.9 83.6  PLT 237 229 213 213 282   Cardiac Enzymes: No results for input(s): CKTOTAL, CKMB, CKMBINDEX, TROPONINI in the last 168 hours. BNP (last 3 results) No results for input(s): BNP in the last 8760 hours.  ProBNP (last 3 results) No results for input(s): PROBNP in the last 8760 hours.  CBG: No results for input(s): GLUCAP in the last 168 hours.  Recent  Results (from the past 240 hour(s))  Clostridium Difficile by PCR (not at [facility])     Status: Abnormal   Collection Time: 02/10/15  7:51 PM  Result Value Ref Range Status   C difficile by pcr POSITIVE (A) NEGATIVE Final    Comment: CRITICAL RESULT CALLED TO, READ BACK BY AND VERIFIED WITH: STURDIVANT,D ON 02/10/15 AT 2145 BY LOY,C      Studies: No results found.  Scheduled Meds: . budesonide-formoterol  2 puff Inhalation BID  . enoxaparin (LOVENOX) injection  40 mg Subcutaneous Q24H  . saccharomyces boulardii   250 mg Oral BID  . vancomycin  125 mg Oral QID   Continuous Infusions:   Principal Problem:   Colitis due to Clostridium difficile Active Problems:   Essential hypertension   Abdominal pain   Nausea and vomiting   Acute renal failure   Dehydration   Hypoxia   Hyponatremia   Hypokalemia    Time spent: 30 minutes    [facility] M  [facility] Pager [phone]. If 7PM-7AM, please contact night-coverage at [email or web address], password [facility] 02/14/2015, 1:19 PM  LOS: 4 days

## 2015-02-15 ENCOUNTER — Encounter (HOSPITAL_COMMUNITY): Payer: Self-pay | Admitting: Internal Medicine

## 2015-02-15 DIAGNOSIS — E871 Hypo-osmolality and hyponatremia: Secondary | ICD-10-CM

## 2015-02-15 DIAGNOSIS — E860 Dehydration: Secondary | ICD-10-CM

## 2015-02-15 DIAGNOSIS — N179 Acute kidney failure, unspecified: Secondary | ICD-10-CM

## 2015-02-15 DIAGNOSIS — A047 Enterocolitis due to Clostridium difficile: Principal | ICD-10-CM

## 2015-02-15 DIAGNOSIS — E876 Hypokalemia: Secondary | ICD-10-CM

## 2015-02-15 MED ORDER — ACETAMINOPHEN 500 MG PO TABS: 500.0000 mg | ORAL_TABLET | Freq: Four times a day (QID) | ORAL | Status: DC | PRN

## 2015-02-15 MED ORDER — VANCOMYCIN 50 MG/ML ORAL SOLUTION: 125.0000 mg | Freq: Four times a day (QID) | ORAL | Status: DC

## 2015-02-15 MED ORDER — SACCHAROMYCES BOULARDII 250 MG PO CAPS: 250.0000 mg | ORAL_CAPSULE | Freq: Two times a day (BID) | ORAL | Status: DC

## 2015-02-15 MED ORDER — HYDROCODONE-ACETAMINOPHEN 5-325 MG PO TABS: 1.0000 | ORAL_TABLET | Freq: Four times a day (QID) | ORAL | Status: DC | PRN

## 2015-02-15 NOTE — Progress Notes (Signed)
1038 D/C instructions, paperwork and hard Rxs given to patient. Patient educated on antibiotic therapy and C.Diff precautions and proper infection control measures. IV catheter removed from RIGHT arm, intact with no s/s of infection noted, patient tolerated well w/no c/o pain or discomfort noted. Patient aware to f/u with PCP on 03/09/15 @ 9:45am. Patient's friend at bedside to transport patient home. Patient assisted to vehicle via w/c by staff.

## 2015-02-15 NOTE — Care Management Note (Signed)
Case Management Note  Patient Details  Name: Betty Golden MRN: [id number] Date of Birth: 02/10/1946  Subjective/Objective:                    Action/Plan:   Expected Discharge Date:                  Expected Discharge Plan:  Home/Self Care  In-House Referral:  NA  Discharge planning Services  CM Consult  Post Acute Care Choice:  NA Choice offered to:  NA  DME Arranged:    DME Agency:     HH Arranged:    HH Agency:     Status of Service:  Completed, signed off  Medicare Important Message Given:  Yes-second notification given Date Medicare IM Given:    Medicare IM give by:    Date Additional Medicare IM Given:    Additional Medicare Important Message give by:     If discussed at Long Length of Stay Meetings, dates discussed:    Additional Comments: Pt discharged home today. No CM needs noted. Arlyss Queen [place], RN 02/15/2015, 10:21 AM

## 2015-02-15 NOTE — Discharge Summary (Signed)
Physician Discharge Summary  Betty Golden PXT:[id number] DOB: 1945/11/12 DOA: 02/10/2015  PCP: Zachery Dauer, MD  Admit date: 02/10/2015 Discharge date: 02/15/2015  Time spent: 40 minutes  Recommendations for Outpatient Follow-up:  1. Follow up with PCP 1 week for evaluation of resolution of cdiff  Discharge Diagnoses:  Principal Problem:   Colitis due to Clostridium difficile Active Problems:   Essential hypertension   Abdominal pain   Nausea and vomiting   Acute renal failure   Dehydration   Hypoxia   Hyponatremia   Hypokalemia   Discharge Condition: stable  Diet recommendation: soft advance as tolerated  Filed Weights   02/10/15 0948 02/10/15 1543  Weight: 74.39 kg (164 lb) 75.433 kg (166 lb 4.8 oz)    History of present illness:  Betty Golden is a 69 y.o. female with past medical hx including HTN, copd chronic headaches presented to the emergency department on 02/10/15 with sudden onset abdominal pain persistent nausea and vomiting. Initial evaluation revealed colitis with edema in the colonic wall, acute renal failure and hypoxia.  Patient reported she was in her usual state of health until 6 AM 02/09/15 when she developed sudden onset epigastric pain. She described the pain as sharp constant and rated it a 10 out of 10 at its worst. Associated symptoms included persistent nausea with vomiting and intermittent diarrhea. She reported having vomited "countless" times during this period. She denied any coffee ground emesis. She reported mostly yellowish green and liquid. She denied any bright red blood per rectum or melanoma. Last BM was earlier in morning prior to presentation. He reported having similar experience in 1995 and she was told she had an infection in her stomach. She denied chest pain palpitations shortness of breath fever chills dysuria hematuria frequency or urgency. He denied any headache dizziness syncope or near-syncope. Workup in the emergency  department revealed a leukocytosis of 19.1 mild hyponatremia 134 hypokalemia 3.4 chloride 98 creatinine 1.12 serum glucose 134. Chest x-ray revealed clear lungs and unremarkable bowel gas pattern. CT of the abdomen pelvis revealed colitis involving ascending and proximal transverse colon most likely infectious or inflammatory versus ischemic, diverticulosis and stable benign left lower lobe nodule.  In the emergency department she was provided with Rocephin 1 g, Flagyl 500 mg intravenously, Zofran and Dilaudid. was given 1 L of fluids   Hospital Course:   Clostridium difficile colitis. Patient reported taking antibiotic for upper respiratory tract infection approximately 2 months ago. She was admitted and proved IV flagyl and oral vancomycin. she remained afebrile and non-toxic appearing.  She improved slowly. Diet gradually advanced she continued with mild nausea so Flagyl discontinued 02/13/15. Diarrhea improved. At discharge she tolerating soft diet with no nausea or pain. Electrolytes within limits of normal. Will discharge with oral vancomycin to complete 14 days.  Recommend follow up with PCP 1-2 weeks  Acute renal failure. Related to dehydration. Resolved with IV fluids.   Hypokalemia. Likely related to frequent stools. Resolved  Hypertension. Fair control. Patient is on losartan and hydrochlorothiazide which was initially held due to above. Will resume at discharge. Marland Kitchen   Hyponatremia. Resolved. Related to volume depletion.   Dehydration. Resolved with IV fluids  Procedures:  none  Consultations:  none  Discharge Exam: Filed Vitals:   02/15/15 0447  BP: 153/66  Pulse: 70  Temp: 98.5 F (36.9 C)  Resp: 18    General: appears well  Cardiovascular: RRR no MGR no LE edema Respiratory: normal effort BS clear bilaterally no wheeze  Abdomen: soft +BS non-tender to palpation  Discharge Instructions    Current Discharge Medication List    START taking these medications    Details  HYDROcodone-acetaminophen (NORCO/VICODIN) 5-325 MG per tablet Take 1-2 tablets by mouth every 6 (six) hours as needed for moderate pain. Qty: 12 tablet, Refills: 0    saccharomyces boulardii (FLORASTOR) 250 MG capsule Take 1 capsule (250 mg total) by mouth 2 (two) times daily. Qty: 60 capsule, Refills: 1    vancomycin (VANCOCIN) 50 mg/mL oral solution Take 2.5 mLs (125 mg total) by mouth 4 (four) times daily. Qty: 100 mL, Refills: 0      CONTINUE these medications which have CHANGED   Details  acetaminophen (TYLENOL) 500 MG tablet Take 1 tablet (500 mg total) by mouth every 6 (six) hours as needed for mild pain.      CONTINUE these medications which have NOT CHANGED   Details  Azelastine HCl 0.15 % SOLN Place 2 sprays into the nose daily. For stuffy nose or dainage Refills: 5    budesonide-formoterol (SYMBICORT) 160-4.5 MCG/ACT inhaler Inhale 2 puffs into the lungs 2 (two) times daily.    calcium-vitamin D (OSCAL 500/200 D-3) 500-200 MG-UNIT per tablet Take 1 tablet by mouth daily.    fexofenadine (ALLEGRA) 180 MG tablet Take 180 mg by mouth daily.    FLUoxetine (PROZAC) 20 MG capsule Take 20 mg by mouth daily.    losartan-hydrochlorothiazide (HYZAAR) 100-12.5 MG per tablet Take 1 tablet by mouth daily.    Multiple Vitamin (MULTIVITAMIN WITH MINERALS) TABS tablet Take 1 tablet by mouth daily.    mometasone (NASONEX) 50 MCG/ACT nasal spray Place 1 spray into both nostrils daily.    montelukast (SINGULAIR) 10 MG tablet Take 10 mg by mouth at bedtime.    PROAIR RESPICLICK 108 (90 BASE) MCG/ACT AEPB Inhale 2 puffs into the lungs every 4 (four) hours as needed (cough/wheezing).  Refills: 1    simvastatin (ZOCOR) 20 MG tablet Take 20 mg by mouth every evening.        Allergies  Allergen Reactions  . Sulfa Antibiotics Rash   Follow-up Information    Follow up with Zachery Dauer, MD.   Specialty:  Family Medicine   Contact information:   [address],  [place] [address] [phone]        The results of significant diagnostics from this hospitalization (including imaging, microbiology, ancillary and laboratory) are listed below for reference.    Significant Diagnostic Studies: Dg Chest 2 View  01/17/2015   CLINICAL DATA:  Chronic cough, asthma  EXAM: CHEST  2 VIEW  COMPARISON:  04/05/2008  FINDINGS: Cardiomediastinal silhouette is stable. No acute infiltrate or pleural effusion. No pulmonary edema. Mild degenerative changes bilateral shoulders.  IMPRESSION: No active cardiopulmonary disease.   Electronically Signed   By: Natasha Mead M.D.   On: 01/17/2015 11:49   Ct Abdomen Pelvis W Contrast  02/10/2015   CLINICAL DATA:  Right-sided abdominal pain. Nausea, vomiting since 02/09/2015. Previous appendectomy, cholecystectomy.  EXAM: CT ABDOMEN AND PELVIS WITH CONTRAST  TECHNIQUE: Multidetector CT imaging of the abdomen and pelvis was performed using the standard protocol following bolus administration of intravenous contrast.  CONTRAST:  100 cc Omnipaque 350  COMPARISON:  04/05/2004  FINDINGS: Lower chest: Within the left lung base there is a 6 mm nodule. Nodule appears stable consistent with scarring or prior granulomatous change. No pleural effusions or consolidations are identified. Heart size is normal. No imaged pericardial effusion or significant  coronary artery calcifications.  Upper abdomen: No focal abnormality identified within the liver, spleen, pancreas M all, or adrenal glands. Benign cyst involving the upper pole the right kidney in measures 1.5 cm. Left kidney has a normal appearance. There is no hydronephrosis. Gallbladder is surgically absent.  Gastrointestinal tract: The stomach and small bowel loops are normal in appearance. 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 There is mild edema of the colonic wall, involving the ascending and proximal transverse colon more than other segments but all segments appear mildly involved.  There are numerous colonic diverticula in the sigmoid colon but no focal diverticulitis identified.  Pelvis: The uterus is present. There is no adnexal mass or free pelvic fluid.  Retroperitoneum: There is atherosclerotic calcification of the aorta. No aneurysm. No retroperitoneal or mesenteric adenopathy. No move  Abdominal wall: Unremarkable.  Osseous structures: Degenerative changes are seen in the lower thoracic and lumbar spine. No suspicious lytic or blastic lesions are identified.  IMPRESSION: 1. Diffuse edema of the colonic wall consistent with colitis, involving the ascending and proximal transverse colon to a greater extent than the remainder of the colonic loops. 2. Considerations include infectious or inflammatory etiologies. Ischemic colitis would be much less likely. 3. Diverticulosis.  No acute diverticulitis. 4. Status post appendectomy, cholecystectomy. 5. Stable, benign left lower lobe nodule, 6 mm.   Electronically Signed   By: Norva Pavlov M.D.   On: 02/10/2015 13:02   Dg Abd Acute W/chest  02/10/2015   CLINICAL DATA:  Generalized abdominal pain, vomiting, and diarrhea for 2 days.  EXAM: DG ABDOMEN ACUTE W/ 1V CHEST  COMPARISON:  Chest radiograph on 01/17/2015  FINDINGS: There is no evidence of dilated bowel loops or free intraperitoneal air. No radiopaque calculi or other significant radiographic abnormality is seen.  Heart size and mediastinal contours are within normal limits. Both lungs are clear.  IMPRESSION: Unremarkable bowel gas pattern.  No active cardiopulmonary disease.   Electronically Signed   By: Myles Rosenthal M.D.   On: 02/10/2015 11:11    Microbiology: Recent Results (from the past 240 hour(s))  Clostridium Difficile by PCR (not at [facility])     Status: Abnormal   Collection Time: 02/10/15  7:51 PM  Result Value Ref Range Status   C difficile by pcr POSITIVE (A) NEGATIVE Final    Comment: CRITICAL RESULT CALLED TO, READ BACK BY AND VERIFIED WITH: STURDIVANT,D ON 02/10/15  AT 2145 BY LOY,C      Labs: Basic Metabolic Panel:  Recent Labs Lab 02/10/15 1007 02/11/15 0612 02/12/15 0559 02/13/15 0730 02/14/15 0819  NA 134* 137 138 137 142  K 3.4* 3.0* 4.0 3.8 4.0  CL 98* 105 110 110 110  CO2 24 23 22  21* 26  GLUCOSE 134* 102* 98 90 93  BUN 13 10 8  5* 6  CREATININE 1.12* 0.87 0.74 0.65 0.69  CALCIUM 8.9 8.2* 8.0* 7.7* 8.8*   Liver Function Tests:  Recent Labs Lab 02/10/15 1007 02/11/15 0612  AST 22 35  ALT 15 20  ALKPHOS 56 50  BILITOT 0.8 0.5  PROT 8.0 7.2  ALBUMIN 3.9 3.5    Recent Labs Lab 02/10/15 1007  LIPASE 16*   No results for input(s): AMMONIA in the last 168 hours. CBC:  Recent Labs Lab 02/10/15 1007 02/11/15 0612 02/12/15 0559 02/13/15 0730 02/14/15 0819  WBC 19.1* 13.4* 10.3 10.1 9.2  NEUTROABS 16.0*  --   --   --   --   HGB 12.6 11.5* 10.5*  10.0* 10.4*  HCT 38.5 34.9* 31.6* 29.7* 32.2*  MCV 84.2 84.3 84.5 83.9 83.6  PLT 237 229 213 213 282   Cardiac Enzymes: No results for input(s): CKTOTAL, CKMB, CKMBINDEX, TROPONINI in the last 168 hours. BNP: BNP (last 3 results) No results for input(s): BNP in the last 8760 hours.  ProBNP (last 3 results) No results for input(s): PROBNP in the last 8760 hours.  CBG: No results for input(s): GLUCAP in the last 168 hours.     SignedGwenyth Bender  [facility] 02/15/2015, 9:24 AM

## 2015-02-15 NOTE — Care Management Important Message (Signed)
Important Message  Patient Details  Name: Betty Golden MRN: [id number] Date of Birth: 1946-07-06   Medicare Important Message Given:  Yes-third notification given    Cheryl Flash, RN 02/15/2015, 10:22 AM

## 2015-03-09 ENCOUNTER — Encounter (HOSPITAL_COMMUNITY): Payer: Self-pay | Admitting: *Deleted

## 2015-03-09 ENCOUNTER — Emergency Department (HOSPITAL_COMMUNITY)
Admission: EM | Admit: 2015-03-09 | Discharge: 2015-03-09 | Disposition: A | Discharge status: Home / Self Care | Payer: Medicare Other | Attending: Emergency Medicine | Admitting: Emergency Medicine

## 2015-03-09 ENCOUNTER — Emergency Department (HOSPITAL_COMMUNITY): Payer: Medicare Other

## 2015-03-09 DIAGNOSIS — Z87448 Personal history of other diseases of urinary system: Secondary | ICD-10-CM | POA: Diagnosis not present

## 2015-03-09 DIAGNOSIS — E780 Pure hypercholesterolemia: Secondary | ICD-10-CM | POA: Diagnosis not present

## 2015-03-09 DIAGNOSIS — R197 Diarrhea, unspecified: Secondary | ICD-10-CM | POA: Insufficient documentation

## 2015-03-09 DIAGNOSIS — Z87891 Personal history of nicotine dependence: Secondary | ICD-10-CM | POA: Insufficient documentation

## 2015-03-09 DIAGNOSIS — R112 Nausea with vomiting, unspecified: Secondary | ICD-10-CM | POA: Diagnosis not present

## 2015-03-09 DIAGNOSIS — Z8719 Personal history of other diseases of the digestive system: Secondary | ICD-10-CM | POA: Insufficient documentation

## 2015-03-09 DIAGNOSIS — G8929 Other chronic pain: Secondary | ICD-10-CM | POA: Insufficient documentation

## 2015-03-09 DIAGNOSIS — J449 Chronic obstructive pulmonary disease, unspecified: Secondary | ICD-10-CM | POA: Insufficient documentation

## 2015-03-09 DIAGNOSIS — Z9049 Acquired absence of other specified parts of digestive tract: Secondary | ICD-10-CM | POA: Diagnosis not present

## 2015-03-09 DIAGNOSIS — Z7951 Long term (current) use of inhaled steroids: Secondary | ICD-10-CM | POA: Insufficient documentation

## 2015-03-09 DIAGNOSIS — I10 Essential (primary) hypertension: Secondary | ICD-10-CM | POA: Diagnosis not present

## 2015-03-09 DIAGNOSIS — Z79899 Other long term (current) drug therapy: Secondary | ICD-10-CM | POA: Insufficient documentation

## 2015-03-09 DIAGNOSIS — R1013 Epigastric pain: Secondary | ICD-10-CM | POA: Diagnosis not present

## 2015-03-09 DIAGNOSIS — R109 Unspecified abdominal pain: Secondary | ICD-10-CM | POA: Diagnosis present

## 2015-03-09 LAB — COMPREHENSIVE METABOLIC PANEL
ALT: 15 U/L (ref 14–54)
ANION GAP: 10 (ref 5–15)
AST: 23 U/L (ref 15–41)
Albumin: 3.9 g/dL (ref 3.5–5.0)
Alkaline Phosphatase: 51 U/L (ref 38–126)
BUN: 13 mg/dL (ref 6–20)
CALCIUM: 9.2 mg/dL (ref 8.9–10.3)
CO2: 25 mmol/L (ref 22–32)
CREATININE: 0.82 mg/dL (ref 0.44–1.00)
Chloride: 104 mmol/L (ref 101–111)
GFR calc non Af Amer: >60 mL/min (ref >60)
GLUCOSE: 98 mg/dL (ref 65–99)
POTASSIUM: 3 mmol/L — AB (ref 3.5–5.1)
SODIUM: 139 mmol/L (ref 135–145)
TOTAL PROTEIN: 7.9 g/dL (ref 6.5–8.1)
Total Bilirubin: 0.6 mg/dL (ref 0.3–1.2)

## 2015-03-09 LAB — CBC WITH DIFFERENTIAL/PLATELET
BASOS ABS: 0.1 10*3/uL (ref 0.0–0.1)
BASOS PCT: 0 % (ref 0–1)
Eosinophils Absolute: 0.3 10*3/uL (ref 0.0–0.7)
Eosinophils Relative: 3 % (ref 0–5)
HCT: 36.4 % (ref 36.0–46.0)
Hemoglobin: 11.7 g/dL — ABNORMAL LOW (ref 12.0–15.0)
LYMPHS PCT: 33 % (ref 12–46)
Lymphs Abs: 3.8 10*3/uL (ref 0.7–4.0)
MCH: 27.4 pg (ref 26.0–34.0)
MCHC: 32.1 g/dL (ref 30.0–36.0)
MCV: 85.2 fL (ref 78.0–100.0)
MONO ABS: 0.7 10*3/uL (ref 0.1–1.0)
Monocytes Relative: 6 % (ref 3–12)
Neutro Abs: 6.6 10*3/uL (ref 1.7–7.7)
Neutrophils Relative %: 58 % (ref 43–77)
PLATELETS: 308 10*3/uL (ref 150–400)
RBC: 4.27 MIL/uL (ref 3.87–5.11)
RDW: 13.8 % (ref 11.5–15.5)
WBC: 11.5 10*3/uL — ABNORMAL HIGH (ref 4.0–10.5)

## 2015-03-09 LAB — URINALYSIS, ROUTINE W REFLEX MICROSCOPIC
BILIRUBIN URINE: NEGATIVE
Glucose, UA: NEGATIVE mg/dL
HGB URINE DIPSTICK: NEGATIVE
Ketones, ur: NEGATIVE mg/dL
Leukocytes, UA: NEGATIVE
Nitrite: NEGATIVE
Protein, ur: NEGATIVE mg/dL
Specific Gravity, Urine: <1.005 — ABNORMAL LOW (ref 1.005–1.030)
UROBILINOGEN UA: 0.2 mg/dL (ref 0.0–1.0)
pH: 6.5 (ref 5.0–8.0)

## 2015-03-09 LAB — LIPASE, BLOOD: LIPASE: 25 U/L (ref 22–51)

## 2015-03-09 LAB — I-STAT CG4 LACTIC ACID, ED
Lactic Acid, Venous: 0.84 mmol/L (ref 0.5–2.0)
Lactic Acid, Venous: 0.69 mmol/L (ref 0.5–2.0)

## 2015-03-09 MED ORDER — IOHEXOL 300 MG/ML  SOLN
50.0000 mL | Freq: Once | INTRAMUSCULAR | Status: AC | PRN
  Administered 2015-03-09: 50 mL via ORAL

## 2015-03-09 MED ORDER — PANTOPRAZOLE SODIUM 40 MG PO TBEC
40.0000 mg | DELAYED_RELEASE_TABLET | Freq: Once | ORAL | Status: DC
  Filled 2015-03-09: qty 1

## 2015-03-09 MED ORDER — ONDANSETRON HCL 4 MG/2ML IJ SOLN
4.0000 mg | Freq: Once | INTRAMUSCULAR | Status: AC
  Administered 2015-03-09: 4 mg via INTRAVENOUS
  Filled 2015-03-09: qty 2

## 2015-03-09 MED ORDER — PANTOPRAZOLE SODIUM 40 MG PO TBEC: 40.0000 mg | DELAYED_RELEASE_TABLET | Freq: Every day | ORAL | Status: DC

## 2015-03-09 MED ORDER — PANTOPRAZOLE SODIUM 40 MG IV SOLR
40.0000 mg | Freq: Once | INTRAVENOUS | Status: AC
  Administered 2015-03-09: 40 mg via INTRAVENOUS
  Filled 2015-03-09: qty 40

## 2015-03-09 MED ORDER — MORPHINE SULFATE 4 MG/ML IJ SOLN
4.0000 mg | Freq: Once | INTRAMUSCULAR | Status: AC
  Administered 2015-03-09: 4 mg via INTRAVENOUS
  Filled 2015-03-09: qty 1

## 2015-03-09 MED ORDER — IOHEXOL 300 MG/ML  SOLN
100.0000 mL | Freq: Once | INTRAMUSCULAR | Status: AC | PRN
  Administered 2015-03-09: 100 mL via INTRAVENOUS

## 2015-03-09 MED ORDER — POTASSIUM CHLORIDE CRYS ER 20 MEQ PO TBCR
40.0000 meq | EXTENDED_RELEASE_TABLET | Freq: Once | ORAL | Status: AC
  Administered 2015-03-09: 40 meq via ORAL
  Filled 2015-03-09: qty 2

## 2015-03-09 MED ORDER — SODIUM CHLORIDE 0.9 % IV SOLN
Freq: Once | INTRAVENOUS | Status: AC
  Administered 2015-03-09: 10:00:00 via INTRAVENOUS

## 2015-03-09 MED ORDER — GI COCKTAIL ~~LOC~~
30.0000 mL | Freq: Once | ORAL | Status: AC
  Administered 2015-03-09: 30 mL via ORAL
  Filled 2015-03-09: qty 30

## 2015-03-09 NOTE — ED Provider Notes (Signed)
This chart was scribed for Glynn Octave, MD by Octavia Heir, ED Scribe. This patient was seen in room APA07/APA07 and the patient's care was started at 9:19 AM.   I saw and evaluated the patient, reviewed the resident's note and I agree with the findings and plan. If applicable, I agree with the resident's interpretation of the EKG.  If applicable, I was present for critical portions of any procedures performed.    HPI Comments: Betty Golden is a 69 y.o. female who presents to the Emergency Department complaining of constant, gradual worsening abdominal pain onset 3 weeks ago. Completed course of PO vancomycin for C dif and states diarrhea has improved. She reports having associated loss of appetite and light-headedness yesterday when she drank some water. Pt was hospitalized last week and states her diarrhea has alleviated but her stool is still yellow and loose. She says she noticed blood in her stool earlier when she wiped. Pt has no modifying factors. She states this pain is something she has not had before although she has had a past hx of stomach ulcers in the past. endoscopy around 2012. Pt has had a cholecystectomy and appendectomy. Pt denies vomiting, fever, chest pain, and dysuria.   ROS: A complete 10 system review of systems was obtained and all systems are negative except as noted in the HPI and PMH.    Physical Exam: Awake, alert, oriented. CTAB, RRR. Abdomen soft, diffusely tender in epigastrium, no guarding, no rebound. 5/5 strength throughout.  DIAGNOSTIC STUDIES: Oxygen Saturation is 99% on RA, normal by my interpretation.  COORDINATION OF CARE:  9:27 AM Discussed treatment plan which includes pain medicationwith pt at bedside and pt agreed to plan.  Abdomen benign.  Check labs, CT, UA.  Likely start PPI as probable component of acid reflux.  I personally performed the services described in this documentation, which was scribed in my presence. The recorded  information has been reviewed and is accurate.   Glynn Octave, MD 03/09/15 [phone]

## 2015-03-09 NOTE — Discharge Instructions (Signed)
Gastroesophageal Reflux Disease, Adult Gastroesophageal reflux disease (GERD) happens when acid from your stomach flows up into the esophagus. When acid comes in contact with the esophagus, the acid causes soreness (inflammation) in the esophagus. Over time, GERD may create small holes (ulcers) in the lining of the esophagus. CAUSES   Increased body weight. This puts pressure on the stomach, making acid rise from the stomach into the esophagus.  Smoking. This increases acid production in the stomach.  Drinking alcohol. This causes decreased pressure in the lower esophageal sphincter (valve or ring of muscle between the esophagus and stomach), allowing acid from the stomach into the esophagus.  Late evening meals and a full stomach. This increases pressure and acid production in the stomach.  A malformed lower esophageal sphincter. Sometimes, no cause is found. SYMPTOMS   Burning pain in the lower part of the mid-chest behind the breastbone and in the mid-stomach area. This may occur twice a week or more often.  Trouble swallowing.  Sore throat.  Dry cough.  Asthma-like symptoms including chest tightness, shortness of breath, or wheezing. DIAGNOSIS  Your caregiver may be able to diagnose GERD based on your symptoms. In some cases, X-rays and other tests may be done to check for complications or to check the condition of your stomach and esophagus. TREATMENT  Your caregiver may recommend over-the-counter or prescription medicines to help decrease acid production. Ask your caregiver before starting or adding any new medicines.  HOME CARE INSTRUCTIONS   Change the factors that you can control. Ask your caregiver for guidance concerning weight loss, quitting smoking, and alcohol consumption.  Avoid foods and drinks that make your symptoms worse, such as:  Caffeine or alcoholic drinks.  Chocolate.  Peppermint or mint flavorings.  Garlic and onions.  Spicy foods.  Citrus fruits,  such as oranges, lemons, or limes.  Tomato-based foods such as sauce, chili, salsa, and pizza.  Fried and fatty foods.  Avoid lying down for the 3 hours prior to your bedtime or prior to taking a nap.  Eat small, frequent meals instead of large meals.  Wear loose-fitting clothing. Do not wear anything tight around your waist that causes pressure on your stomach.  Raise the head of your bed 6 to 8 inches with wood blocks to help you sleep. Extra pillows will not help.  Only take over-the-counter or prescription medicines for pain, discomfort, or fever as directed by your caregiver.  Do not take aspirin, ibuprofen, or other nonsteroidal anti-inflammatory drugs (NSAIDs). SEEK IMMEDIATE MEDICAL CARE IF:   You have pain in your arms, neck, jaw, teeth, or back.  Your pain increases or changes in intensity or duration.  You develop nausea, vomiting, or sweating (diaphoresis).  You develop shortness of breath, or you faint.  Your vomit is green, yellow, black, or looks like coffee grounds or blood.  Your stool is red, bloody, or black. These symptoms could be signs of other problems, such as heart disease, gastric bleeding, or esophageal bleeding. MAKE SURE YOU:   Understand these instructions.  Will watch your condition.  Will get help right away if you are not doing well or get worse. Document Released: 04/24/2005 Document Revised: 10/07/2011 Document Reviewed: 02/01/2011 [employer] Patient Information 2015 [employer]. This information is not intended to replace advice given to you by your health care provider. Make sure you discuss any questions you have with your health care provider.  

## 2015-03-09 NOTE — ED Provider Notes (Signed)
CSN: [id number]     Arrival date & time 03/09/15  0841 History   First MD Initiated Contact with Patient 03/09/15 [phone]     Chief Complaint  Patient presents with  . Abdominal Pain   HPI Betty Golden is a 69yo female presenting today with abdominal pain. Complains of mid-abdominal pain x3 weeks. Describes pain as aching. States pain has been the same since throughout last hospitalization and is unchanged. Pain is constant, but worse when lying down. Also notes nausea and soft yellow bowel movements. Has approximately three bowel movements per day. Denies vomiting, diarrhea, constipation, or fevers. Also notes occasional bitter taste in her mouth. Reports decreased PO intake since discharge. Denies any pain extending up into her chest. Norco prescribed for pain at discharge helped with pain, but she ran out yesterday.  Recently hospitalized with infectious colitis. Presented on 7/15 with sudden onset abdominal pain, nausea, vomiting, and diarrhea. Was admitted for colitis with edema of colonic wall, acute renal failure, and hypoxia. Tested positive for C. Difficile. Treated with IV flagyl and oral vancomycin. Was tolerating PO and denied diarrhea and nausea/vomiting at discharge. Discharged on 02/15/15.  Reports history of cholecystectomy and appendectomy.   Past Medical History  Diagnosis Date  . Hypertension   . High cholesterol   . Chronic headaches   . Asthma   . COPD (chronic obstructive pulmonary disease)   . Shortness of breath     SOB with exertion  . Colitis     01/2015  . Diverticulosis   . Lung nodule     per CT stable/benign  . Acute renal failure   . Hypoxia    Past Surgical History  Procedure Laterality Date  . Appendectomy    . Cholecystectomy    . Breast reduction surgery    . Tonsillectomy    . Colonoscopy N/A 08/19/2013    Procedure: COLONOSCOPY;  Surgeon: Malissa Hippo, MD;  Location: [facility];  Service: Endoscopy;  Laterality: N/A;  155-moved to 140 Ann to  notify pt   Family History  Problem Relation Age of Onset  . Colon cancer Neg Hx    Social History  Substance Use Topics  . Smoking status: Former Smoker -- 2.00 packs/day for 10 years    Types: Cigarettes  . Smokeless tobacco: None     Comment: quit 1990  . Alcohol Use: No     Comment: stopped in 1990   OB History    No data available     Review of Systems  Constitutional: Positive for appetite change. Negative for fever.  Cardiovascular: Negative for chest pain.  Gastrointestinal: Positive for nausea and abdominal pain. Negative for vomiting, diarrhea and constipation.  Genitourinary: Negative for dysuria, urgency and frequency.      Allergies  Sulfa antibiotics  Home Medications   Prior to Admission medications   Medication Sig Start Date End Date Taking? Authorizing Provider  acetaminophen (TYLENOL) 500 MG tablet Take 1 tablet (500 mg total) by mouth every 6 (six) hours as needed for mild pain. 02/15/15  Yes Lesle Chris Black, NP  Azelastine HCl 0.15 % SOLN Place 2 sprays into the nose daily. For stuffy nose or dainage 11/08/14  Yes Historical Provider, MD  budesonide-formoterol (SYMBICORT) 160-4.5 MCG/ACT inhaler Inhale 2 puffs into the lungs 2 (two) times daily.   Yes Historical Provider, MD  calcium-vitamin D (OSCAL 500/200 D-3) 500-200 MG-UNIT per tablet Take 1 tablet by mouth daily.   Yes Historical Provider, MD  fexofenadine (  ALLEGRA) 180 MG tablet Take 180 mg by mouth daily.   Yes Historical Provider, MD  FLUoxetine (PROZAC) 20 MG capsule Take 20 mg by mouth daily.   Yes Historical Provider, MD  HYDROcodone-acetaminophen (NORCO/VICODIN) 5-325 MG per tablet Take 1-2 tablets by mouth every 6 (six) hours as needed for moderate pain. 02/15/15  Yes Lesle Chris Black, NP  losartan-hydrochlorothiazide (HYZAAR) 100-12.5 MG per tablet Take 1 tablet by mouth daily.   Yes Historical Provider, MD  mometasone (NASONEX) 50 MCG/ACT nasal spray Place 1 spray into both nostrils daily.   Yes  Historical Provider, MD  montelukast (SINGULAIR) 10 MG tablet Take 10 mg by mouth at bedtime.   Yes Historical Provider, MD  Multiple Vitamin (MULTIVITAMIN WITH MINERALS) TABS tablet Take 1 tablet by mouth daily.   Yes Historical Provider, MD  PROAIR RESPICLICK 108 (90 BASE) MCG/ACT AEPB Inhale 2 puffs into the lungs every 4 (four) hours as needed (cough/wheezing).  11/09/14  Yes Historical Provider, MD  saccharomyces boulardii (FLORASTOR) 250 MG capsule Take 1 capsule (250 mg total) by mouth 2 (two) times daily. 02/15/15  Yes Lesle Chris Black, NP  simvastatin (ZOCOR) 20 MG tablet Take 20 mg by mouth every evening.    Yes Historical Provider, MD  pantoprazole (PROTONIX) 40 MG tablet Take 1 tablet (40 mg total) by mouth daily. 03/09/15    N Rumley, DO  vancomycin (VANCOCIN) 50 mg/mL oral solution Take 2.5 mLs (125 mg total) by mouth 4 (four) times daily. Patient not taking: Reported on 03/09/2015 02/15/15   Lesle Chris Black, NP   BP 150/77 mmHg  Pulse 69  Temp(Src) 98.2 F (36.8 C) (Oral)  Resp 15  Ht 5\' 5"  (1.651 m)  Wt 163 lb (73.936 kg)  BMI 27.12 kg/m2  SpO2 100% Physical Exam  Constitutional: She appears well-developed and well-nourished. No distress.  HENT:  Head: Atraumatic.  Cardiovascular: Normal rate and regular rhythm.  Exam reveals no gallop and no friction rub.   No murmur heard. Pulmonary/Chest: Effort normal. No respiratory distress. She has no wheezes. She has no rales.  Abdominal: Soft. She exhibits no distension. There is no rebound.  Diffuse tenderness, worse in epigastric region. Negative Rovsings.     ED Course  Procedures (including critical care time) Labs Review Labs Reviewed  URINALYSIS, ROUTINE W REFLEX MICROSCOPIC (NOT AT [facility]) - Abnormal; Notable for the following:    Specific Gravity, Urine <1.005 (*)    All other components within normal limits  CBC WITH DIFFERENTIAL/PLATELET - Abnormal; Notable for the following:    WBC 11.5 (*)    Hemoglobin 11.7 (*)     All other components within normal limits  COMPREHENSIVE METABOLIC PANEL - Abnormal; Notable for the following:    Potassium 3.0 (*)    All other components within normal limits  C DIFFICILE QUICK SCAN W PCR REFLEX  LIPASE, BLOOD  I-STAT CG4 LACTIC ACID, ED  I-STAT CG4 LACTIC ACID, ED  POC OCCULT BLOOD, ED    Imaging Review Ct Abdomen Pelvis W Contrast  03/09/2015   CLINICAL DATA:  Abdominal pain for 3 weeks, loss of appetite  EXAM: CT ABDOMEN AND PELVIS WITH CONTRAST  TECHNIQUE: Multidetector CT imaging of the abdomen and pelvis was performed using the standard protocol following bolus administration of intravenous contrast.  CONTRAST:  67mL OMNIPAQUE IOHEXOL 300 MG/ML SOLN, OMNIPAQUE IOHEXOL 300 MG/ML SOLN  COMPARISON:  02/10/2015  FINDINGS: The lung bases are unremarkable. Sagittal images of the spine shows mild degenerative changes  thoracolumbar spine. Mild atherosclerotic calcifications of abdominal aorta and bilateral iliac arteries. Atherosclerotic calcifications celiac trunk origin. Enhanced liver shows no focal mass. The pancreas, spleen and adrenal glands are unremarkable. Enhanced kidneys are symmetrical in size. There is a cyst in midpole of the left kidney measures 1.3 cm. Delayed renal images shows bilateral renal symmetrical excretion. No hydronephrosis or hydroureter.  No aortic aneurysm.  No small bowel obstruction.  No ascites or free air.  No adenopathy.  There is mild residual thickening of cecal wall in axial image 66. There is no evidence of acute inflammation. Appendix is not identified. The terminal ileum is unremarkable. No pericolonic abscess or perforation.  Multiple sigmoid colon diverticula are noted. There is no evidence of acute diverticulitis. The uterus is normal size for age. No adnexal mass. Urinary bladder is unremarkable.  IMPRESSION: 1. There is no evidence of acute inflammatory process within abdomen or pelvis. 2. Minimal residual thickening of cecal  wall without evidence of acute inflammation. No pericolonic abscess. 3. No small bowel or colonic obstruction. 4. No hydronephrosis or hydroureter. 5. Multiple sigmoid colon diverticula. No evidence of acute diverticulitis.   Electronically Signed   By: Natasha Mead M.D.   On: 03/09/2015 12:34     EKG Interpretation   Date/Time:  Thursday March 09 2015 09:24:22 EDT Ventricular Rate:  78 PR Interval:  173 QRS Duration: 98 QT Interval:  390 QTC Calculation: 444 R Axis:   42 Text Interpretation:  Sinus rhythm Consider left atrial enlargement No  significant change was found Confirmed by Manus Gunning  MD, STEPHEN ([id number]) on  03/09/2015 9:32:50 AM      MDM   Final diagnoses:  Epigastric pain  Will obtain urinalysis, CBC with differential, CMP, lipase, and lactic acid. Will repeat CT imaging. Lactic Acid normal. CMP with hypokalemia (3), otherwise normal. Kdur given. CBC with leukocytosis 11.5 and mild anemia 11.7. EKG NSR. Lipase normal. Urinalysis without signs of infection. Morphine given for pain control. CT abdomen/pelvis with no evidence of acute process, minimal residual thickening of cecal wall from previous infection but no active signs of infection, and multiple sigmoid diverticula. GI Cocktail given. Suspect pain is secondary to GERD due to history of epigastric pain, occasional bitter taste in mouth, and worsening when supine. Will discharge with Protonix. Recommend follow up with GI.    Lora Havens [place], [place] 03/09/15 1311  Glynn Octave, MD 03/09/15 1525

## 2015-03-09 NOTE — ED Notes (Signed)
Pt c/o mid abdominal pain x 3 weeks rated at 8/10 as aching. Pt reports nausea and soft yellow bowel movements (three times daily). Denies vomiting. Pt reports being hospitalized 3 weeks ago for colon infection and food poisoning and was placed on antibiotics which she has now finished.

## 2015-03-16 ENCOUNTER — Ambulatory Visit (INDEPENDENT_AMBULATORY_CARE_PROVIDER_SITE_OTHER): Payer: Medicare Other | Admitting: Internal Medicine

## 2015-03-16 ENCOUNTER — Encounter (INDEPENDENT_AMBULATORY_CARE_PROVIDER_SITE_OTHER): Payer: Self-pay | Admitting: Internal Medicine

## 2015-03-16 ENCOUNTER — Telehealth (INDEPENDENT_AMBULATORY_CARE_PROVIDER_SITE_OTHER): Payer: Self-pay | Admitting: *Deleted

## 2015-03-16 VITALS — BP 112/50 | HR 76 | Temp 97.7°F | Ht 65.0 in | Wt 163.3 lb

## 2015-03-16 DIAGNOSIS — A047 Enterocolitis due to Clostridium difficile: Secondary | ICD-10-CM | POA: Diagnosis not present

## 2015-03-16 DIAGNOSIS — A0472 Enterocolitis due to Clostridium difficile, not specified as recurrent: Secondary | ICD-10-CM

## 2015-03-16 DIAGNOSIS — R1013 Epigastric pain: Secondary | ICD-10-CM

## 2015-03-16 NOTE — Progress Notes (Addendum)
Subjective:    Patient ID: Betty Golden, female    DOB: 1946-07-05, 69 y.o.   MRN: [id number]  HPI Admitted to [facility] in July with C. Difficile colitis She had abdominal pain (epigastric) x 1 day. She had nausea and vomiting, and diarrhea. There was no fever. She was found to have c-diff. She was given IV flagyl and oral vancomycin while in hospital. She had nausea with the flagyl so it was discontinued. Her diarrhea improved. She was discharge with Vancomycin 125mg  qid x 14 days. Her dehydration,acute renal failure resolved with IV fluids. She says the epigastric pain never really resolved. She presented back to the ED 03/09/2015 for same pain. She had been hurting this time x 1 day.   She was treated for GERD and was given Protonix once a day. She tells me the Protonix has not helped. She says she had severe pain in her epigastric region. She still has diarrhea now. Her stool is yellow. She says her stool has a foul smell.  She says her stool smells the same as it did when she had c-diff. She is having about 3 stools a day. Her stools are loose. She has not been on any medication since admission to the hospital.  She has lost 3 pounds since her symptoms started.  She denies having any acid reflux. Appetite is better. She feels 50% better. She was seen at [facility] in May (ENT) and given an Rx for Augmentin for sinusitis.  03/13/2015 H and H 11.6 and 35.9,platelet ct 307, Albumin 4.0, Total protein 7.5, AST 22, ALP 64, total bili 0.3, ALT 21  CBC    Component Value Date/Time   WBC 11.5* 03/09/2015 0900   RBC 4.27 03/09/2015 0900   HGB 11.7* 03/09/2015 0900   HCT 36.4 03/09/2015 0900   PLT 308 03/09/2015 0900   MCV 85.2 03/09/2015 0900   MCH 27.4 03/09/2015 0900   MCHC 32.1 03/09/2015 0900   RDW 13.8 03/09/2015 0900   LYMPHSABS 3.8 03/09/2015 0900   MONOABS 0.7 03/09/2015 0900   EOSABS 0.3 03/09/2015 0900   BASOSABS 0.1 03/09/2015 0900    Lipase     Component Value Date/Time   LIPASE  25 03/09/2015 0900    CMP Latest Ref Rng 03/09/2015 02/14/2015 02/13/2015  Glucose 65 - 99 mg/dL 98 93 90  BUN 6 - 20 mg/dL 13 6 5(L)  Creatinine 02/15/2015 - 1.00 mg/dL 8.67 6.72 0.94  Sodium 135 - 145 mmol/L 139 142 137  Potassium 3.5 - 5.1 mmol/L 3.0(L) 4.0 3.8  Chloride 101 - 111 mmol/L 104 110 110  CO2 22 - 32 mmol/L 25 26 21(L)  Calcium 8.9 - 10.3 mg/dL 9.2 7.09) 7.7(L)  Total Protein 6.5 - 8.1 g/dL 7.9 - -  Total Bilirubin 0.3 - 1.2 mg/dL 0.6 - -  Alkaline Phos 38 - 126 U/L 51 - -  AST 15 - 41 U/L 23 - -  ALT 14 - 54 U/L 15 - -      03/09/2015 CT abdomen/pelvis with CM:        CLINICAL DATA: Abdominal pain for 3 weeks, loss of appetite     IMPRESSION: 1. There is no evidence of acute inflammatory process within abdomen or pelvis. 2. Minimal residual thickening of cecal wall without evidence of acute inflammation. No pericolonic abscess. 3. No small bowel or colonic obstruction. 4. No hydronephrosis or hydroureter. 5. Multiple sigmoid colon diverticula. No evidence of acute  02/10/2015 CT abdomen/pelvis with CM:  CLINICAL DATA: Right-sided abdominal pain. Nausea, vomiting since 02/09/2015. Previous appendectomy, cholecystectomy.    IMPRESSION: 1. Diffuse edema of the colonic wall consistent with colitis, involving the ascending and proximal transverse colon to a greater extent than the remainder of the colonic loops. 2. Considerations include infectious or inflammatory etiologies. Ischemic colitis would be much less likely. 3. Diverticulosis. No acute diverticulitis. 4. Status post appendectomy, cholecystectomy. 5. Stable, benign left lower lobe nodule, 6 mm.      02/10/2015 GI pathogen. C diff positive. All other negative.  08/19/2013 Colonoscopy  Indications: Patient is 69 year old African American female who is undergoing screening colonoscopy. Her last exam was 10 years ago. She does report having had hematochezia since her colonoscopy was  scheduled.    Impression:  Examination performed to cecum. 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 Internal/external hemorrhoids. Review of Systems Past Medical History  Diagnosis Date  . Hypertension   . High cholesterol   . Chronic headaches   . Asthma   . COPD (chronic obstructive pulmonary disease)   . Shortness of breath     SOB with exertion  . Colitis     01/2015  . Diverticulosis   . Lung nodule     per CT stable/benign  . Acute renal failure   . Hypoxia   . C. difficile colitis     Past Surgical History  Procedure Laterality Date  . Appendectomy    . Cholecystectomy    . Breast reduction surgery    . Tonsillectomy    . Colonoscopy N/A 08/19/2013    Procedure: COLONOSCOPY;  Surgeon: Malissa Hippo, MD;  Location: [facility];  Service: Endoscopy;  Laterality: N/A;  155-moved to 140 Ann to notify pt    Allergies  Allergen Reactions  . Sulfa Antibiotics Rash    Current Outpatient Prescriptions on File Prior to Visit  Medication Sig Dispense Refill  . acetaminophen (TYLENOL) 500 MG tablet Take 1 tablet (500 mg total) by mouth every 6 (six) hours as needed for mild pain.    . Azelastine HCl 0.15 % SOLN Place 2 sprays into the nose daily. For stuffy nose or dainage  5  . calcium-vitamin D (OSCAL 500/200 D-3) 500-200 MG-UNIT per tablet Take 1 tablet by mouth daily.    . fexofenadine (ALLEGRA) 180 MG tablet Take 180 mg by mouth daily.    Marland Kitchen FLUoxetine (PROZAC) 20 MG capsule Take 20 mg by mouth daily.    Marland Kitchen losartan-hydrochlorothiazide (HYZAAR) 100-12.5 MG per tablet Take 1 tablet by mouth daily.    . mometasone (NASONEX) 50 MCG/ACT nasal spray Place 1 spray into both nostrils daily.    . montelukast (SINGULAIR) 10 MG tablet Take 10 mg by mouth at bedtime.    . Multiple Vitamin (MULTIVITAMIN WITH MINERALS) TABS tablet Take 1 tablet by mouth daily.    . pantoprazole (PROTONIX) 40 MG tablet Take 1 tablet (40 mg total) by mouth  daily. 30 tablet 0  . PROAIR RESPICLICK 108 (90 BASE) MCG/ACT AEPB Inhale 2 puffs into the lungs every 4 (four) hours as needed (cough/wheezing).   1  . saccharomyces boulardii (FLORASTOR) 250 MG capsule Take 1 capsule (250 mg total) by mouth 2 (two) times daily. 60 capsule 1  . simvastatin (ZOCOR) 20 MG tablet Take 20 mg by mouth every evening.     . budesonide-formoterol (SYMBICORT) 160-4.5 MCG/ACT inhaler Inhale 2 puffs into the lungs 2 (two) times daily.    Marland Kitchen HYDROcodone-acetaminophen (NORCO/VICODIN) 5-325 MG  per tablet Take 1-2 tablets by mouth every 6 (six) hours as needed for moderate pain. (Patient not taking: Reported on 03/16/2015) 12 tablet 0  . vancomycin (VANCOCIN) 50 mg/mL oral solution Take 2.5 mLs (125 mg total) by mouth 4 (four) times daily. (Patient not taking: Reported on 03/09/2015) 100 mL 0   No current facility-administered medications on file prior to visit.        Objective:   Physical Exam Blood pressure 112/50, pulse 76, temperature 97.7 F (36.5 C), height 5\' 5"  (1.651 m), weight 163 lb 4.8 oz (74.072 kg). Alert and oriented. Skin warm and dry. Oral mucosa is moist.   . Sclera anicteric, conjunctivae is pink. Thyroid not enlarged. No cervical lymphadenopathy. Lungs clear. Heart regular rate and rhythm.  Abdomen is soft. Bowel sounds are positive. No hepatomegaly. No abdominal masses felt. No tenderness.  No edema to lower extremities.         Assessment & Plan:  Diarrhea, epigastric pain. She denies heart burn. She may have relapsed with the C-diff. Am going to repeat a GI pathogen on her. Further recommendations once I have this back.

## 2015-03-16 NOTE — Telephone Encounter (Signed)
Telephone encounter opened in error.

## 2015-03-16 NOTE — Patient Instructions (Addendum)
GI Pathogen. Further recommendations to follow.  Continue the Protonix.

## 2015-03-17 ENCOUNTER — Encounter (INDEPENDENT_AMBULATORY_CARE_PROVIDER_SITE_OTHER): Payer: Self-pay

## 2015-03-17 LAB — GASTROINTESTINAL PATHOGEN PANEL PCR
C. difficile Tox A/B, PCR: NEGATIVE
CAMPYLOBACTER, PCR: NEGATIVE
Cryptosporidium, PCR: NEGATIVE
E coli (ETEC) LT/ST PCR: NEGATIVE
E coli (STEC) stx1/stx2, PCR: NEGATIVE
E coli 0157, PCR: NEGATIVE
Giardia lamblia, PCR: NEGATIVE
Norovirus, PCR: NEGATIVE
Rotavirus A, PCR: NEGATIVE
SHIGELLA, PCR: NEGATIVE
Salmonella, PCR: POSITIVE — CR

## 2015-03-17 LAB — C-REACTIVE PROTEIN: CRP: 2.6 mg/dL — AB (ref <0.60)

## 2015-03-20 ENCOUNTER — Telehealth (INDEPENDENT_AMBULATORY_CARE_PROVIDER_SITE_OTHER): Payer: Self-pay | Admitting: Internal Medicine

## 2015-03-20 DIAGNOSIS — K529 Noninfective gastroenteritis and colitis, unspecified: Secondary | ICD-10-CM

## 2015-03-20 MED ORDER — CIPROFLOXACIN HCL 500 MG PO TABS: 500.0000 mg | ORAL_TABLET | Freq: Two times a day (BID) | ORAL | Status: DC

## 2015-03-20 NOTE — Telephone Encounter (Signed)
Am going to cover her with Cipro 500mg  BID x 7 days.

## 2015-04-04 ENCOUNTER — Other Ambulatory Visit: Payer: Self-pay | Admitting: Family Medicine

## 2015-04-17 ENCOUNTER — Ambulatory Visit (INDEPENDENT_AMBULATORY_CARE_PROVIDER_SITE_OTHER): Payer: Medicare Other | Admitting: Internal Medicine

## 2015-04-17 ENCOUNTER — Encounter (INDEPENDENT_AMBULATORY_CARE_PROVIDER_SITE_OTHER): Payer: Self-pay | Admitting: Internal Medicine

## 2015-04-17 VITALS — BP 150/70 | HR 64 | Temp 98.1°F | Ht 65.0 in | Wt 161.5 lb

## 2015-04-17 DIAGNOSIS — A020 Salmonella enteritis: Secondary | ICD-10-CM

## 2015-04-17 DIAGNOSIS — A029 Salmonella infection, unspecified: Secondary | ICD-10-CM | POA: Diagnosis not present

## 2015-04-17 DIAGNOSIS — R1013 Epigastric pain: Secondary | ICD-10-CM

## 2015-04-17 MED ORDER — OMEPRAZOLE 20 MG PO CPDR: 20.0000 mg | DELAYED_RELEASE_CAPSULE | Freq: Every day | ORAL | Status: DC

## 2015-04-17 NOTE — Patient Instructions (Signed)
OV in 1 year.  

## 2015-04-17 NOTE — Progress Notes (Signed)
Subjective:    Patient ID: Betty Golden, female    DOB: 11/18/1945, 69 y.o.   MRN: [id number]  HPI  Here today for f/u. She was admited to [facility] in July with C. Diff. Abdominal pain and epigastric pain x 1 day. She had n,v, diarrhea. No fever. She was found to have C-diff. She was covered with Flagyl and Vancomycin. After discharge, she was covered with Vancomycin 125mg  QID x 14 days. She presented back to to the ED 03/09/2015 for same pain. She was treated for GERD and given Protonix daily.   Seen in office 03/16/2015. She was still having diarrhea. GI pathogen was negative for C-diff but positive for Salmonella. She was positive for Salmonella in July but not treated. I treated her with Cipro 500mg  BID x 7 days.  She tells me today she is better. Stool are loose. No melena or BRRB. She sometimes has 3-4 stools. Stools are loose but not watery.  Stools are better. When she lies down she says sometimes she has epigastric tenderness. She has a lot of 'burping".  Appetite is good. She has lost  1 1/2 pounds since her visit in August. She is 90% better.    08/19/2013 Colonoscopy Indications: Patient is 69 year old African American female who is undergoing screening colonoscopy. Her last exam was 10 years ago. She does report having had hematochezia since her colonoscopy was scheduled.  Impression:  Examination performed to cecum. 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 Internal/external hemorrhoids.    03/13/2015 H and H 11.6 and 35.9,platelet ct 307, Albumin 4.0, Total protein 7.5, AST 22, ALP 64, total bili 0.3, ALT 21   Review of Systems Past Medical History  Diagnosis Date  . Hypertension   . High cholesterol   . Chronic headaches   . Asthma   . COPD (chronic obstructive pulmonary disease)   . Shortness of breath     SOB with exertion  . Colitis     01/2015  . Diverticulosis   . Lung nodule     per CT stable/benign  . Acute renal  failure   . Hypoxia   . C. difficile colitis     Past Surgical History  Procedure Laterality Date  . Appendectomy    . Cholecystectomy    . Breast reduction surgery    . Tonsillectomy    . Colonoscopy N/A 08/19/2013    Procedure: COLONOSCOPY;  Surgeon: 02/2015, MD;  Location: [facility];  Service: Endoscopy;  Laterality: N/A;  155-moved to 140 Ann to notify pt    Allergies  Allergen Reactions  . Sulfa Antibiotics Rash    Current Outpatient Prescriptions on File Prior to Visit  Medication Sig Dispense Refill  . acetaminophen (TYLENOL) 500 MG tablet Take 1 tablet (500 mg total) by mouth every 6 (six) hours as needed for mild pain.    . Azelastine HCl 0.15 % SOLN Place 2 sprays into the nose daily. For stuffy nose or dainage  5  . fexofenadine (ALLEGRA) 180 MG tablet Take 180 mg by mouth daily.    08/21/2013 FLUoxetine (PROZAC) 20 MG capsule Take 20 mg by mouth daily.    Malissa Hippo losartan-hydrochlorothiazide (HYZAAR) 100-12.5 MG per tablet Take 1 tablet by mouth daily.    . mometasone (NASONEX) 50 MCG/ACT nasal spray Place 1 spray into both nostrils daily.    . mometasone-formoterol (DULERA) 100-5 MCG/ACT AERO Inhale 2 puffs into the lungs 2 (two) times daily.    Marland Kitchen  montelukast (SINGULAIR) 10 MG tablet Take 10 mg by mouth at bedtime.    . Multiple Vitamin (MULTIVITAMIN WITH MINERALS) TABS tablet Take 1 tablet by mouth daily.    Marland Kitchen PROAIR RESPICLICK 108 (90 BASE) MCG/ACT AEPB Inhale 2 puffs into the lungs every 4 (four) hours as needed (cough/wheezing).   1  . saccharomyces boulardii (FLORASTOR) 250 MG capsule Take 1 capsule (250 mg total) by mouth 2 (two) times daily. 60 capsule 1  . simvastatin (ZOCOR) 20 MG tablet Take 20 mg by mouth every evening.     . budesonide-formoterol (SYMBICORT) 160-4.5 MCG/ACT inhaler Inhale 2 puffs into the lungs 2 (two) times daily.    . calcium-vitamin D (OSCAL 500/200 D-3) 500-200 MG-UNIT per tablet Take 1 tablet by mouth daily.    . ciprofloxacin (CIPRO) 500  MG tablet Take 1 tablet (500 mg total) by mouth 2 (two) times daily. (Patient not taking: Reported on 04/17/2015) 14 tablet 0  . HYDROcodone-acetaminophen (NORCO/VICODIN) 5-325 MG per tablet Take 1-2 tablets by mouth every 6 (six) hours as needed for moderate pain. (Patient not taking: Reported on 03/16/2015) 12 tablet 0  . pantoprazole (PROTONIX) 40 MG tablet Take 1 tablet (40 mg total) by mouth daily. (Patient not taking: Reported on 04/17/2015) 30 tablet 0  . vancomycin (VANCOCIN) 50 mg/mL oral solution Take 2.5 mLs (125 mg total) by mouth 4 (four) times daily. (Patient not taking: Reported on 03/09/2015) 100 mL 0   No current facility-administered medications on file prior to visit.        Objective:   Physical Exam Blood pressure 150/70, pulse 64, temperature 98.1 F (36.7 C), height 5\' 5"  (1.651 m), weight 161 lb 8 oz (73.256 kg). Alert and oriented. Skin warm and dry. Oral mucosa is moist.   . Sclera anicteric, conjunctivae is pink. Thyroid not enlarged. No cervical lymphadenopathy. Lungs clear. Heart regular rate and rhythm.  Abdomen is soft. Bowel sounds are positive. No hepatomegaly. No abdominal masses felt. No tenderness.  No edema to lower extremities.          Assessment & Plan:  C-diff resolved. Last GI pathogen not detected. Has been covered for Salmonella. Feels 90% better. Epigastric tender. Probable ? Gastritis from antibiotics.Am going to cover her with Omperazole 20mg  daily. PR in 2 weeks.  OV in 1 yr.

## 2015-05-03 ENCOUNTER — Telehealth (INDEPENDENT_AMBULATORY_CARE_PROVIDER_SITE_OTHER): Payer: Self-pay | Admitting: *Deleted

## 2015-05-03 NOTE — Telephone Encounter (Signed)
The medicine Betty Golden gave her is working great. "Thank You"

## 2015-05-03 NOTE — Telephone Encounter (Signed)
noted 

## 2015-07-03 DIAGNOSIS — J309 Allergic rhinitis, unspecified: Secondary | ICD-10-CM

## 2015-07-03 DIAGNOSIS — J310 Chronic rhinitis: Secondary | ICD-10-CM | POA: Insufficient documentation

## 2015-07-03 DIAGNOSIS — J4540 Moderate persistent asthma, uncomplicated: Secondary | ICD-10-CM

## 2015-07-04 ENCOUNTER — Encounter: Payer: Self-pay | Admitting: Allergy and Immunology

## 2015-07-04 ENCOUNTER — Ambulatory Visit (INDEPENDENT_AMBULATORY_CARE_PROVIDER_SITE_OTHER): Payer: Medicare Other | Admitting: Allergy and Immunology

## 2015-07-04 VITALS — BP 122/74 | HR 68 | Temp 98.0°F | Resp 16

## 2015-07-04 DIAGNOSIS — R062 Wheezing: Secondary | ICD-10-CM | POA: Diagnosis not present

## 2015-07-04 DIAGNOSIS — R05 Cough: Secondary | ICD-10-CM

## 2015-07-04 DIAGNOSIS — J310 Chronic rhinitis: Secondary | ICD-10-CM

## 2015-07-04 DIAGNOSIS — R059 Cough, unspecified: Secondary | ICD-10-CM

## 2015-07-04 MED ORDER — MOMETASONE FURO-FORMOTEROL FUM 200-5 MCG/ACT IN AERO: INHALATION_SPRAY | RESPIRATORY_TRACT | Status: DC

## 2015-07-04 NOTE — Progress Notes (Signed)
FOLLOW UP NOTE  RE: Betty Golden MRN: [id number] DOB: 03/10/1946 [facility] [place] [address] [place], [place] [address] Date of Golden Visit: 07/04/2015  Subjective:  Betty Golden is a 69 y.o. female who presents today for Cough; Nasal Congestion; and Wheezing   Assessment:   1. Cough and report of wheeze in this patient with previous diagnosis of asthma, clear lung exam and baseline Golden spirometry.    2. 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   3.      Complex medical history on multiple medication regime Plan:   Patient Instructions   1. Avoidance: Mite and strong odors, fluctuant temperature changes 2. Antihistamine: Allegra 180mg  by mouth once daily for runny nose or itching 3. Nasal Spray: Nasonex 2 spray(s) each nostril once daily in the morning for stuffy nose or drainage.       Increase Astepro to 1-2 sprays each nostril once to twice daily. 4. Inhalers:  Rescue: ProAir respiclick puffs every 4 hours as needed for cough or wheeze.       -May use 2 puffs 10-20 minutes prior to exercise.  Preventative: Dulera 2 puffs twice daily (Rinse, gargle, and spit out after use) 5. Nasal Saline wash followed by nasal spray three times daily and as needed. 6. Continue Singular 10 mg once daily. 7. Follow up Visit: 4-6 months or sooner if needed.  HPI: Betty Golden with intermittent cough.  She reports doing well since her last visit in August and the medications are definitely beneficial.  She describes over the last 7 days, congestion, and postnasal drip with slight sinus pressure and intermittent cough.  She believes there may have been increasing phlegm at night and slight wheeze without shortness of breath, difficulty breathing, chest tightness, chest congestion, discolored drainage or fever.  There is no disrupted  sleep or activity and she has not used any albuterol.  She reports using her nasal sprays and Dulera once daily.  There has been no emergency department or urgent care visits, prednisone or antibiotics.  She reports no other new medical issues.  Current Medications: 1.  Dulera 200 g 2 puffs once daily. 2.  ProAir respclick as needed. 3.  Fexofenadine 180 mg once daily. 4.  Montelukast 10 mg once daily. 5.  Nasonex 2 sprays in the morning. 6.  Astepro 2 sprays in the evening. 7.  Continues Prozac, Hyzaar, multivitamin, Protonix, simvastatin and calcium daily.  Drug Allergies: Allergies  Allergen Reactions  . Sulfa Antibiotics Rash    Objective:   Filed Vitals:   07/04/15 1036  BP: 122/74  Pulse: 68  Temp: 98 F (36.7 C)  Resp: 16   Physical Exam  Constitutional: She is well-developed, well-nourished, and in no distress.  HENT:  Head: Atraumatic.  Right Ear: Tympanic membrane and ear canal normal.  Left Ear: Tympanic membrane and ear canal normal.  Nose: Mucosal edema present. No rhinorrhea. No epistaxis.  Mouth/Throat: Oropharynx is clear and moist and mucous membranes are normal. No oropharyngeal exudate, posterior oropharyngeal edema or posterior oropharyngeal erythema.  Neck: Neck supple.  Cardiovascular: Normal rate, S1 normal and S2 normal.   No murmur heard. Pulmonary/Chest: Effort normal. She has no wheezes. She has no rhonchi. She has no rales.  Lymphadenopathy:    She has no cervical adenopathy.    Diagnostics:Spirometry:  FVC1.76--71%,  FEV1  1.46--82%.  Carolie Mcilrath M. Willa Rough, MD  cc: Suzy Bouchard, MD

## 2015-07-04 NOTE — Patient Instructions (Addendum)
Take Home Sheet  1. Avoidance: Mite  And strong odors, fluctuant temperature changes  2. Antihistamine: Allegra 180mg  by mouth once daily for runny nose or itching.   3. Nasal Spray: Nasonex 2 spray(s) each nostril once daily in the morning for stuffy nose or drainage.       Astepro 1-2 sprays each nostril once to twice daily.  4. Inhalers:  Rescue: ProAir respiclick puffs every 4 hours as needed for cough or wheeze.       -May use 2 puffs 10-20 minutes prior to exercise.   Preventative: Dulera 2 puffs twice daily (Rinse, gargle, and spit out after use).   5. Nasal Saline wash followed by nasal spray three times daily and as needed.  6.  Continue Singular 10 mg once daily.  7. Follow up Visit: 4-6 months or sooner if needed.   Websites that have reliable Patient information: 1. American Academy of Asthma, Allergy, & Immunology: www.aaaai.org 2. Food Allergy Network: www.foodallergy.org 3. Mothers of Asthmatics: www.aanma.org 4. [facility]: 5. American College of Allergy, Asthma, & Immunology: https://www.strong.com/ or www.acaai.org

## 2015-07-05 ENCOUNTER — Other Ambulatory Visit: Payer: Self-pay | Admitting: Allergy and Immunology

## 2015-08-31 ENCOUNTER — Ambulatory Visit: Payer: Self-pay | Admitting: Orthopedic Surgery

## 2015-09-11 NOTE — Patient Instructions (Addendum)
YOUR PROCEDURE IS SCHEDULED ON : 09/20/15  REPORT TO [facility] MAIN ENTRANCE FOLLOW SIGNS TO EAST ELEVATOR - GO TO 3rd FLOOR CHECK IN AT 3 EAST NURSES STATION (SHORT STAY) AT:  7:45 AM  CALL THIS NUMBER IF YOU HAVE PROBLEMS THE MORNING OF SURGERY [phone]  REMEMBER:ONLY 1 PER PERSON MAY GO TO SHORT STAY WITH YOU TO GET READY THE MORNING OF YOUR SURGERY  DO NOT EAT FOOD OR DRINK LIQUIDS AFTER MIDNIGHT  TAKE THESE MEDICINES THE MORNING OF SURGERY: DULERA / FEXOFENADINE / OMEPRAZOLE / PROZAC / NASONEX SPRAY / MAY TAKE TRAMADOL IF NEEDED  YOU MAY NOT HAVE ANY METAL ON YOUR BODY INCLUDING HAIR PINS AND PIERCING'S. DO NOT WEAR JEWELRY, MAKEUP, LOTIONS, POWDERS OR PERFUMES. DO NOT WEAR NAIL POLISH. DO NOT SHAVE 48 HRS PRIOR TO SURGERY. MEN MAY SHAVE FACE AND NECK.  DO NOT BRING VALUABLES TO HOSPITAL. [facility] IS NOT RESPONSIBLE FOR VALUABLES.  CONTACTS, DENTURES OR PARTIALS MAY NOT BE WORN TO SURGERY. LEAVE SUITCASE IN CAR. CAN BE BROUGHT TO ROOM AFTER SURGERY.  PATIENTS DISCHARGED THE DAY OF SURGERY WILL NOT BE ALLOWED TO DRIVE HOME.  PLEASE READ OVER THE FOLLOWING INSTRUCTION SHEETS _________________________________________________________________________________                                          [facility] - PREPARING FOR SURGERY  Before surgery, you can play an important role.  Because skin is not sterile, your skin needs to be as free of germs as possible.  You can reduce the number of germs on your skin by washing with CHG (chlorahexidine gluconate) soap before surgery.  CHG is an antiseptic cleaner which kills germs and bonds with the skin to continue killing germs even after washing. Please DO NOT use if you have an allergy to CHG or antibacterial soaps.  If your skin becomes reddened/irritated stop using the CHG and inform your nurse when you arrive at Short Stay. Do not shave (including legs and underarms) for at least 48 hours prior to the first  CHG shower.  You may shave your face. Please follow these instructions carefully:   1.  Shower with CHG Soap the night before surgery and the  morning of Surgery.   2.  If you choose to wash your hair, wash your hair first as usual with your  normal  Shampoo.   3.  After you shampoo, rinse your hair and body thoroughly to remove the  shampoo.                                         4.  Use CHG as you would any other liquid soap.  You can apply chg directly  to the skin and wash . Gently wash with scrungie or clean wascloth    5.  Apply the CHG Soap to your body ONLY FROM THE NECK DOWN.   Do not use on open                           Wound or open sores. Avoid contact with eyes, ears mouth and genitals (private parts).  Genitals (private parts) with your normal soap.              6.  Wash thoroughly, paying special attention to the area where your surgery  will be performed.   7.  Thoroughly rinse your body with warm water from the neck down.   8.  DO NOT shower/wash with your normal soap after using and rinsing off  the CHG Soap .                9.  Pat yourself dry with a clean towel.             10.  Wear clean night clothes to bed after shower             11.  Place clean sheets on your bed the night of your first shower and do not  sleep with pets.  Day of Surgery : Do not apply any lotions/deodorants the morning of surgery.  Please wear clean clothes to the hospital/surgery center.  FAILURE TO FOLLOW THESE INSTRUCTIONS MAY RESULT IN THE CANCELLATION OF YOUR SURGERY    PATIENT SIGNATURE_________________________________  ______________________________________________________________________     Adam Phenix  An incentive spirometer is a tool that can help keep your lungs clear and active. This tool measures how well you are filling your lungs with each breath. Taking long deep breaths may help reverse or decrease the chance of developing  breathing (pulmonary) problems (especially infection) following:  A long period of time when you are unable to move or be active. BEFORE THE PROCEDURE   If the spirometer includes an indicator to show your best effort, your nurse or respiratory therapist will set it to a desired goal.  If possible, sit up straight or lean slightly forward. Try not to slouch.  Hold the incentive spirometer in an upright position. INSTRUCTIONS FOR USE  1. Sit on the edge of your bed if possible, or sit up as far as you can in bed or on a chair. 2. Hold the incentive spirometer in an upright position. 3. Breathe out normally. 4. Place the mouthpiece in your mouth and seal your lips tightly around it. 5. Breathe in slowly and as deeply as possible, raising the piston or the ball toward the top of the column. 6. Hold your breath for 3-5 seconds or for as long as possible. Allow the piston or ball to fall to the bottom of the column. 7. Remove the mouthpiece from your mouth and breathe out normally. 8. Rest for a few seconds and repeat Steps 1 through 7 at least 10 times every 1-2 hours when you are awake. Take your time and take a few normal breaths between deep breaths. 9. The spirometer may include an indicator to show your best effort. Use the indicator as a goal to work toward during each repetition. 10. After each set of 10 deep breaths, practice coughing to be sure your lungs are clear. If you have an incision (the cut made at the time of surgery), support your incision when coughing by placing a pillow or rolled up towels firmly against it. Once you are able to get out of bed, walk around indoors and cough well. You may stop using the incentive spirometer when instructed by your caregiver.  RISKS AND COMPLICATIONS  Take your time so you do not get dizzy or light-headed.  If you are in pain, you may need to take or ask for pain medication before doing incentive spirometry. It is  harder to take a deep  breath if you are having pain. AFTER USE  Rest and breathe slowly and easily.  It can be helpful to keep track of a log of your progress. Your caregiver can provide you with a simple table to help with this. If you are using the spirometer at home, follow these instructions: [place] IF:   You are having difficultly using the spirometer.  You have trouble using the spirometer as often as instructed.  Your pain medication is not giving enough relief while using the spirometer.  You develop fever of 100.5 F (38.1 C) or higher. SEEK IMMEDIATE MEDICAL CARE IF:   You cough up bloody sputum that had not been present before.  You develop fever of 102 F (38.9 C) or greater.  You develop worsening pain at or near the incision site. MAKE SURE YOU:   Understand these instructions.  Will watch your condition.  Will get help right away if you are not doing well or get worse. Document Released: 11/25/2006 Document Revised: 10/07/2011 Document Reviewed: 01/26/2007 [facility] Patient Information 2014 [place], [place].   ________________________________________________________________________

## 2015-09-12 ENCOUNTER — Encounter (HOSPITAL_COMMUNITY)
Admission: RE | Admit: 2015-09-12 | Discharge: 2015-09-12 | Disposition: A | Discharge status: Home / Self Care | Payer: Medicare Other | Source: Ambulatory Visit | Attending: Specialist | Admitting: Specialist

## 2015-09-12 ENCOUNTER — Ambulatory Visit (HOSPITAL_COMMUNITY)
Admission: RE | Admit: 2015-09-12 | Discharge: 2015-09-12 | Disposition: A | Discharge status: Home / Self Care | Payer: Medicare Other | Source: Ambulatory Visit | Attending: Orthopedic Surgery | Admitting: Orthopedic Surgery

## 2015-09-12 ENCOUNTER — Encounter (HOSPITAL_COMMUNITY): Payer: Self-pay

## 2015-09-12 DIAGNOSIS — Z01812 Encounter for preprocedural laboratory examination: Secondary | ICD-10-CM | POA: Diagnosis not present

## 2015-09-12 DIAGNOSIS — M5136 Other intervertebral disc degeneration, lumbar region: Secondary | ICD-10-CM | POA: Insufficient documentation

## 2015-09-12 DIAGNOSIS — Z01818 Encounter for other preprocedural examination: Secondary | ICD-10-CM | POA: Diagnosis present

## 2015-09-12 DIAGNOSIS — M5126 Other intervertebral disc displacement, lumbar region: Secondary | ICD-10-CM

## 2015-09-12 HISTORY — DX: Gastro-esophageal reflux disease without esophagitis: K21.9

## 2015-09-12 HISTORY — DX: Other intervertebral disc displacement, lumbar region: M51.26

## 2015-09-12 HISTORY — DX: Major depressive disorder, single episode, unspecified: F32.9

## 2015-09-12 HISTORY — DX: Depression, unspecified: F32.A

## 2015-09-12 HISTORY — DX: Unspecified osteoarthritis, unspecified site: M19.90

## 2015-09-12 LAB — BASIC METABOLIC PANEL
Anion gap: 9 (ref 5–15)
BUN: 13 mg/dL (ref 6–20)
CHLORIDE: 105 mmol/L (ref 101–111)
CO2: 31 mmol/L (ref 22–32)
Calcium: 9.8 mg/dL (ref 8.9–10.3)
Creatinine, Ser: 0.87 mg/dL (ref 0.44–1.00)
GFR calc Af Amer: >60 mL/min (ref >60)
GFR calc non Af Amer: >60 mL/min (ref >60)
GLUCOSE: 99 mg/dL (ref 65–99)
POTASSIUM: 4.5 mmol/L (ref 3.5–5.1)
Sodium: 145 mmol/L (ref 135–145)

## 2015-09-12 LAB — CBC
HEMATOCRIT: 37.9 % (ref 36.0–46.0)
Hemoglobin: 11.8 g/dL — ABNORMAL LOW (ref 12.0–15.0)
MCH: 27 pg (ref 26.0–34.0)
MCHC: 31.1 g/dL (ref 30.0–36.0)
MCV: 86.7 fL (ref 78.0–100.0)
Platelets: 307 10*3/uL (ref 150–400)
RBC: 4.37 MIL/uL (ref 3.87–5.11)
RDW: 13.9 % (ref 11.5–15.5)
WBC: 12.1 10*3/uL — ABNORMAL HIGH (ref 4.0–10.5)

## 2015-09-12 LAB — SURGICAL PCR SCREEN
MRSA, PCR: NEGATIVE
Staphylococcus aureus: NEGATIVE

## 2015-09-18 ENCOUNTER — Ambulatory Visit: Payer: Self-pay | Admitting: Orthopedic Surgery

## 2015-09-18 NOTE — H&P (Signed)
Betty Golden is an 70 y.o. female.   Chief Complaint: back and bilateral leg pain HPI: The patient is a 70 year old female who presents today for follow up of their back. The patient is being followed for their right-sided back pain. They are now 4 month(s) out from when symptoms began. Symptoms reported today include: pain (low back and right leg). Current treatment includes: activity modification and pain medications (prn). The following medication has been used for pain control: Tylenol and Ultram. The patient reports their current pain level to be mild to moderate. The patient presents today following ESI (@ L5-S1).  Betty Golden is status was epidural. She had temporary relief, now it has returned. It radiates in the outer aspect of the foot. This is on the right. She is also getting some on the left.  Past Medical History  Diagnosis Date  . Hypertension   . High cholesterol   . Chronic headaches   . Asthma   . Shortness of breath     SOB with exertion  . Colitis     01/2015  . Diverticulosis   . Lung nodule     per CT stable/benign  . Hypoxia   . C. difficile colitis   . HNP (herniated nucleus pulposus), lumbar   . Arthritis   . GERD (gastroesophageal reflux disease)   . Depression     Past Surgical History  Procedure Laterality Date  . Appendectomy    . Cholecystectomy    . Breast reduction surgery    . Tonsillectomy    . Colonoscopy N/A 08/19/2013    Procedure: COLONOSCOPY;  Surgeon: Malissa Hippo, MD;  Location: [facility];  Service: Endoscopy;  Laterality: N/A;  155-moved to 140 Ann to notify pt    Family History  Problem Relation Age of Onset  . Colon cancer Neg Hx    Social History:  reports that she quit smoking about 27 years ago. Her smoking use included Cigarettes. She has a 20 pack-year smoking history. She does not have any smokeless tobacco history on file. She reports that she does not drink alcohol or use illicit drugs.  Allergies:   Allergies  Allergen Reactions  . Sulfa Antibiotics Rash     (Not in a hospital admission)  No results found for this or any previous visit (from the past 48 hour(s)). No results found.  Review of Systems  Constitutional: Negative.   HENT: Negative.   Eyes: Negative.   Respiratory: Negative.   Cardiovascular: Negative.   Gastrointestinal: Negative.   Genitourinary: Negative.   Musculoskeletal: Positive for back pain.  Skin: Negative.   Neurological: Positive for sensory change and focal weakness.    There were no vitals taken for this visit. Physical Exam  Constitutional: She is oriented to person, place, and time. She appears well-developed and well-nourished.  HENT:  Head: Normocephalic.  Eyes: Conjunctivae are normal. Pupils are equal, round, and reactive to light.  Neck: Normal range of motion.  Cardiovascular: Normal rate.   Respiratory: Effort normal.  GI: Soft.  Musculoskeletal:  he is in moderate distress. Walks with an antalgic gait. Straight leg raise on the right, buttock, thigh and calf pain; on left, buttock and thigh pain. EHLs 4+/5. She has diminished plantarflexion on the right compared to the left. Pain with extension.  Lumbar spine exam reveals no evidence of soft tissue swelling, deformity or skin ecchymosis. On palpation there is no tenderness of the lumbar spine. No flank pain with percussion.  The abdomen is soft and nontender. Nontender over the trochanters. No cellulitis or lymphadenopathy.  Good range of motion of the lumbar spine without associated pain. Motor is 5/5 including EHL, tibialis anterior, quadriceps and hamstrings. Patient is normoreflexic. There is no Babinski or clonus. Sensory exam is intact to light touch. Patient has good distal pulses. No DVT. No pain and normal range of motion without instability of the hips, knees and ankles.  Neurological: She is alert and oriented to person, place, and time.    MRI demonstrates again  significant lateral recess stenosis at 5-1, facet and ligamentum flavum hypertrophy, effacement of L5 and S1 nerve root.  Assessment/Plan Persistent L5-S1 radiculopathy, right greater than left due to spinal stenosis. Refractory to rest, activity modification, exercise program. Temporary from an epidural.  We discussed two options, one living with her symptoms. She does not feel that she can do that. Other option is to consider decompression at 5-1 the lateral recess. Does have facet arthropathy. We discussed ongoing back pain requiring further management, though feel she is a candidate for a fusion. Continue with her tramadol and activity modification. She has no significant past medical history. No history of DVT or mass. We will proceed.  I had an extensive discussion of the risks and benefits of the lumbar decompression with the patient including bleeding, infection, damage to neurovascular structures, epidural fibrosis, CSF leak requiring repair. We also discussed increase in pain, adjacent segment disease, recurrent disc herniation, need for future surgery including repeat decompression and/or fusion. We also discussed risks of postoperative hematoma, paralysis, anesthetic complications including DVT, PE, death, cardiopulmonary dysfunction. In addition, the perioperative and postoperative courses were discussed in detail including the rehabilitative time and return to functional activity and work. I provided the patient with an illustrated handout and utilized the appropriate surgical models.  Plan microlumbar decompression L5-S1 bilateral  Dorothy Spark., PA-C for Dr. Shelle Iron 09/18/2015, 8:57 AM

## 2015-09-20 ENCOUNTER — Ambulatory Visit (HOSPITAL_COMMUNITY): Payer: Medicare Other | Admitting: Certified Registered"

## 2015-09-20 ENCOUNTER — Ambulatory Visit (HOSPITAL_COMMUNITY)
Admission: RE | Admit: 2015-09-20 | Discharge: 2015-09-21 | Disposition: A | Discharge status: Home / Self Care | Payer: Medicare Other | Source: Ambulatory Visit | Attending: Specialist | Admitting: Specialist

## 2015-09-20 ENCOUNTER — Ambulatory Visit (HOSPITAL_COMMUNITY): Payer: Medicare Other

## 2015-09-20 ENCOUNTER — Encounter (HOSPITAL_COMMUNITY): Payer: Self-pay

## 2015-09-20 ENCOUNTER — Encounter (HOSPITAL_COMMUNITY)
Admission: RE | Disposition: A | Discharge status: Home / Self Care | Payer: Self-pay | Source: Ambulatory Visit | Attending: Specialist

## 2015-09-20 DIAGNOSIS — K219 Gastro-esophageal reflux disease without esophagitis: Secondary | ICD-10-CM | POA: Diagnosis not present

## 2015-09-20 DIAGNOSIS — Z79899 Other long term (current) drug therapy: Secondary | ICD-10-CM | POA: Insufficient documentation

## 2015-09-20 DIAGNOSIS — M4807 Spinal stenosis, lumbosacral region: Secondary | ICD-10-CM | POA: Insufficient documentation

## 2015-09-20 DIAGNOSIS — J449 Chronic obstructive pulmonary disease, unspecified: Secondary | ICD-10-CM | POA: Diagnosis not present

## 2015-09-20 DIAGNOSIS — E7800 Pure hypercholesterolemia, unspecified: Secondary | ICD-10-CM | POA: Diagnosis not present

## 2015-09-20 DIAGNOSIS — I10 Essential (primary) hypertension: Secondary | ICD-10-CM | POA: Diagnosis not present

## 2015-09-20 DIAGNOSIS — Z419 Encounter for procedure for purposes other than remedying health state, unspecified: Secondary | ICD-10-CM

## 2015-09-20 DIAGNOSIS — Z87891 Personal history of nicotine dependence: Secondary | ICD-10-CM | POA: Insufficient documentation

## 2015-09-20 DIAGNOSIS — M1990 Unspecified osteoarthritis, unspecified site: Secondary | ICD-10-CM | POA: Diagnosis not present

## 2015-09-20 DIAGNOSIS — J45909 Unspecified asthma, uncomplicated: Secondary | ICD-10-CM | POA: Diagnosis not present

## 2015-09-20 DIAGNOSIS — M48061 Spinal stenosis, lumbar region without neurogenic claudication: Secondary | ICD-10-CM

## 2015-09-20 HISTORY — PX: LUMBAR LAMINECTOMY/DECOMPRESSION MICRODISCECTOMY: SHX5026

## 2015-09-20 SURGERY — LUMBAR LAMINECTOMY/DECOMPRESSION MICRODISCECTOMY 1 LEVEL
Anesthesia: General | Site: Back | Laterality: Bilateral

## 2015-09-20 MED ORDER — ALBUTEROL SULFATE (2.5 MG/3ML) 0.083% IN NEBU: 2.5000 mg | INHALATION_SOLUTION | RESPIRATORY_TRACT | Status: DC | PRN

## 2015-09-20 MED ORDER — HYDROCODONE-ACETAMINOPHEN 5-325 MG PO TABS: 1.0000 | ORAL_TABLET | ORAL | Status: DC | PRN

## 2015-09-20 MED ORDER — THROMBIN 5000 UNITS EX SOLR
CUTANEOUS | Status: AC
  Filled 2015-09-20: qty 10000

## 2015-09-20 MED ORDER — LACTATED RINGERS IV SOLN
INTRAVENOUS | Status: DC
  Administered 2015-09-20 (×2): via INTRAVENOUS

## 2015-09-20 MED ORDER — MOMETASONE FURO-FORMOTEROL FUM 200-5 MCG/ACT IN AERO
2.0000 | INHALATION_SPRAY | Freq: Two times a day (BID) | RESPIRATORY_TRACT | Status: DC
  Administered 2015-09-20: 2 via RESPIRATORY_TRACT
  Filled 2015-09-20: qty 8.8

## 2015-09-20 MED ORDER — PHENOL 1.4 % MT LIQD
1.0000 | OROMUCOSAL | Status: DC | PRN
Start: 2015-09-20 — End: 2015-09-21

## 2015-09-20 MED ORDER — LORATADINE 10 MG PO TABS
10.0000 mg | ORAL_TABLET | Freq: Every day | ORAL | Status: DC
  Administered 2015-09-21: 10 mg via ORAL
  Filled 2015-09-20: qty 1

## 2015-09-20 MED ORDER — TRAMADOL HCL 50 MG PO TABS: 50.0000 mg | ORAL_TABLET | Freq: Three times a day (TID) | ORAL | Status: DC | PRN

## 2015-09-20 MED ORDER — FLUOXETINE HCL 20 MG PO CAPS
20.0000 mg | ORAL_CAPSULE | Freq: Every day | ORAL | Status: DC
  Administered 2015-09-21: 20 mg via ORAL
  Filled 2015-09-20: qty 1

## 2015-09-20 MED ORDER — PANTOPRAZOLE SODIUM 40 MG PO TBEC
40.0000 mg | DELAYED_RELEASE_TABLET | Freq: Every day | ORAL | Status: DC
  Administered 2015-09-21: 40 mg via ORAL
  Filled 2015-09-20: qty 1

## 2015-09-20 MED ORDER — SUGAMMADEX SODIUM 200 MG/2ML IV SOLN
INTRAVENOUS | Status: AC
  Filled 2015-09-20: qty 2

## 2015-09-20 MED ORDER — THROMBIN 5000 UNITS EX SOLR
OROMUCOSAL | Status: DC | PRN
  Administered 2015-09-20: 12:00:00 via TOPICAL

## 2015-09-20 MED ORDER — FENTANYL CITRATE (PF) 100 MCG/2ML IJ SOLN: INTRAMUSCULAR | Status: DC | PRN

## 2015-09-20 MED ORDER — MAGNESIUM CITRATE PO SOLN: 1.0000 | Freq: Once | ORAL | Status: DC | PRN

## 2015-09-20 MED ORDER — BUPIVACAINE-EPINEPHRINE (PF) 0.5% -1:200000 IJ SOLN
INTRAMUSCULAR | Status: AC
  Filled 2015-09-20: qty 30

## 2015-09-20 MED ORDER — LOSARTAN POTASSIUM-HCTZ 100-12.5 MG PO TABS: 1.0000 | ORAL_TABLET | Freq: Every day | ORAL | Status: DC

## 2015-09-20 MED ORDER — MIDAZOLAM HCL 5 MG/5ML IJ SOLN
INTRAMUSCULAR | Status: DC | PRN
  Administered 2015-09-20: 2 mg via INTRAVENOUS

## 2015-09-20 MED ORDER — DEXAMETHASONE SODIUM PHOSPHATE 10 MG/ML IJ SOLN
INTRAMUSCULAR | Status: AC
  Filled 2015-09-20: qty 1

## 2015-09-20 MED ORDER — SENNOSIDES-DOCUSATE SODIUM 8.6-50 MG PO TABS: 1.0000 | ORAL_TABLET | Freq: Every evening | ORAL | Status: DC | PRN

## 2015-09-20 MED ORDER — ONDANSETRON HCL 4 MG/2ML IJ SOLN
INTRAMUSCULAR | Status: AC
  Filled 2015-09-20: qty 2

## 2015-09-20 MED ORDER — METHOCARBAMOL 500 MG PO TABS
500.0000 mg | ORAL_TABLET | Freq: Four times a day (QID) | ORAL | Status: DC | PRN
  Administered 2015-09-20 – 2015-09-21 (×2): 500 mg via ORAL
  Filled 2015-09-20: qty 1

## 2015-09-20 MED ORDER — TRAMADOL HCL 50 MG PO TABS: 50.0000 mg | ORAL_TABLET | Freq: Four times a day (QID) | ORAL | Status: DC | PRN

## 2015-09-20 MED ORDER — ONDANSETRON HCL 4 MG/2ML IJ SOLN
INTRAMUSCULAR | Status: DC | PRN
  Administered 2015-09-20 (×2): 2 mg via INTRAVENOUS

## 2015-09-20 MED ORDER — FENTANYL CITRATE (PF) 100 MCG/2ML IJ SOLN
INTRAMUSCULAR | Status: AC
  Filled 2015-09-20: qty 2

## 2015-09-20 MED ORDER — HYDROMORPHONE HCL 1 MG/ML IJ SOLN
INTRAMUSCULAR | Status: AC
  Filled 2015-09-20: qty 1

## 2015-09-20 MED ORDER — ONDANSETRON HCL 4 MG/2ML IJ SOLN: 4.0000 mg | INTRAMUSCULAR | Status: DC | PRN

## 2015-09-20 MED ORDER — CEFAZOLIN SODIUM-DEXTROSE 2-3 GM-% IV SOLR
2.0000 g | INTRAVENOUS | Status: AC
  Administered 2015-09-20: 2 g via INTRAVENOUS

## 2015-09-20 MED ORDER — BISACODYL 5 MG PO TBEC: 5.0000 mg | DELAYED_RELEASE_TABLET | Freq: Every day | ORAL | Status: DC | PRN

## 2015-09-20 MED ORDER — PHENYLEPHRINE 40 MCG/ML (10ML) SYRINGE FOR IV PUSH (FOR BLOOD PRESSURE SUPPORT)
PREFILLED_SYRINGE | INTRAVENOUS | Status: AC
  Filled 2015-09-20: qty 10

## 2015-09-20 MED ORDER — MONTELUKAST SODIUM 10 MG PO TABS
10.0000 mg | ORAL_TABLET | Freq: Every day | ORAL | Status: DC
  Administered 2015-09-20: 10 mg via ORAL
  Filled 2015-09-20 (×2): qty 1

## 2015-09-20 MED ORDER — DOCUSATE SODIUM 100 MG PO CAPS: 100.0000 mg | ORAL_CAPSULE | Freq: Two times a day (BID) | ORAL | Status: DC | PRN

## 2015-09-20 MED ORDER — MIDAZOLAM HCL 2 MG/2ML IJ SOLN
INTRAMUSCULAR | Status: AC
  Filled 2015-09-20: qty 2

## 2015-09-20 MED ORDER — SODIUM CHLORIDE 0.9 % IR SOLN
Status: DC | PRN
  Administered 2015-09-20: 500 mL

## 2015-09-20 MED ORDER — DOCUSATE SODIUM 100 MG PO CAPS
100.0000 mg | ORAL_CAPSULE | Freq: Two times a day (BID) | ORAL | Status: DC
  Administered 2015-09-21: 100 mg via ORAL

## 2015-09-20 MED ORDER — FENTANYL CITRATE (PF) 100 MCG/2ML IJ SOLN
INTRAMUSCULAR | Status: DC | PRN
  Administered 2015-09-20: 100 ug via INTRAVENOUS

## 2015-09-20 MED ORDER — LOSARTAN POTASSIUM 50 MG PO TABS
100.0000 mg | ORAL_TABLET | Freq: Every day | ORAL | Status: DC
  Filled 2015-09-20: qty 2

## 2015-09-20 MED ORDER — METHOCARBAMOL 1000 MG/10ML IJ SOLN
500.0000 mg | Freq: Four times a day (QID) | INTRAVENOUS | Status: DC | PRN
  Administered 2015-09-20: 500 mg via INTRAVENOUS
  Filled 2015-09-20 (×2): qty 5

## 2015-09-20 MED ORDER — DEXAMETHASONE SODIUM PHOSPHATE 10 MG/ML IJ SOLN
INTRAMUSCULAR | Status: DC | PRN
  Administered 2015-09-20: 10 mg via INTRAVENOUS

## 2015-09-20 MED ORDER — HYDROCHLOROTHIAZIDE 12.5 MG PO CAPS
12.5000 mg | ORAL_CAPSULE | Freq: Every day | ORAL | Status: DC
  Filled 2015-09-20: qty 1

## 2015-09-20 MED ORDER — LIDOCAINE HCL (CARDIAC) 20 MG/ML IV SOLN
INTRAVENOUS | Status: DC | PRN
  Administered 2015-09-20: 80 mg via INTRAVENOUS

## 2015-09-20 MED ORDER — ROCURONIUM BROMIDE 100 MG/10ML IV SOLN
INTRAVENOUS | Status: AC
  Filled 2015-09-20: qty 1

## 2015-09-20 MED ORDER — FLUTICASONE PROPIONATE 50 MCG/ACT NA SUSP
1.0000 | Freq: Every day | NASAL | Status: DC
  Filled 2015-09-20: qty 16

## 2015-09-20 MED ORDER — PROPOFOL 10 MG/ML IV BOLUS
INTRAVENOUS | Status: DC | PRN
  Administered 2015-09-20: 50 mg via INTRAVENOUS
  Administered 2015-09-20: 150 mg via INTRAVENOUS

## 2015-09-20 MED ORDER — ALUM & MAG HYDROXIDE-SIMETH 200-200-20 MG/5ML PO SUSP: 30.0000 mL | Freq: Four times a day (QID) | ORAL | Status: DC | PRN

## 2015-09-20 MED ORDER — KCL IN DEXTROSE-NACL 20-5-0.45 MEQ/L-%-% IV SOLN
INTRAVENOUS | Status: DC
  Administered 2015-09-20: 50 mL/h via INTRAVENOUS
  Filled 2015-09-20 (×2): qty 1000

## 2015-09-20 MED ORDER — ACETAMINOPHEN 650 MG RE SUPP: 650.0000 mg | RECTAL | Status: DC | PRN

## 2015-09-20 MED ORDER — ROCURONIUM BROMIDE 100 MG/10ML IV SOLN
INTRAVENOUS | Status: AC
  Filled 2015-09-20: qty 2

## 2015-09-20 MED ORDER — SODIUM CHLORIDE 0.9 % IR SOLN
Status: AC
  Filled 2015-09-20: qty 1

## 2015-09-20 MED ORDER — OXYCODONE-ACETAMINOPHEN 5-325 MG PO TABS
1.0000 | ORAL_TABLET | ORAL | Status: DC | PRN
  Administered 2015-09-20 – 2015-09-21 (×2): 1 via ORAL
  Filled 2015-09-20 (×2): qty 1

## 2015-09-20 MED ORDER — AZELASTINE HCL 0.1 % NA SOLN
2.0000 | Freq: Every day | NASAL | Status: DC
  Administered 2015-09-20: 2 via NASAL
  Filled 2015-09-20: qty 30

## 2015-09-20 MED ORDER — ACETAMINOPHEN 325 MG PO TABS: 650.0000 mg | ORAL_TABLET | ORAL | Status: DC | PRN

## 2015-09-20 MED ORDER — CEFAZOLIN SODIUM-DEXTROSE 2-3 GM-% IV SOLR
2.0000 g | Freq: Three times a day (TID) | INTRAVENOUS | Status: AC
  Administered 2015-09-20 – 2015-09-21 (×3): 2 g via INTRAVENOUS
  Filled 2015-09-20 (×4): qty 50

## 2015-09-20 MED ORDER — TRETINOIN 0.05 % EX CREA: 1.0000 "application " | TOPICAL_CREAM | Freq: Every day | CUTANEOUS | Status: DC

## 2015-09-20 MED ORDER — SUGAMMADEX SODIUM 200 MG/2ML IV SOLN
INTRAVENOUS | Status: DC | PRN
  Administered 2015-09-20: 150 mg via INTRAVENOUS

## 2015-09-20 MED ORDER — MENTHOL 3 MG MT LOZG: 1.0000 | LOZENGE | OROMUCOSAL | Status: DC | PRN

## 2015-09-20 MED ORDER — HYDROMORPHONE HCL 1 MG/ML IJ SOLN
0.5000 mg | INTRAMUSCULAR | Status: DC | PRN
  Filled 2015-09-20: qty 1

## 2015-09-20 MED ORDER — LIDOCAINE HCL (CARDIAC) 20 MG/ML IV SOLN
INTRAVENOUS | Status: AC
  Filled 2015-09-20: qty 5

## 2015-09-20 MED ORDER — BUPIVACAINE-EPINEPHRINE 0.5% -1:200000 IJ SOLN
INTRAMUSCULAR | Status: DC | PRN
  Administered 2015-09-20: 15 mL

## 2015-09-20 MED ORDER — PHENYLEPHRINE HCL 10 MG/ML IJ SOLN
INTRAMUSCULAR | Status: DC | PRN
  Administered 2015-09-20: 80 ug via INTRAVENOUS
  Administered 2015-09-20: 40 ug via INTRAVENOUS
  Administered 2015-09-20 (×6): 80 ug via INTRAVENOUS

## 2015-09-20 MED ORDER — HYDROMORPHONE HCL 1 MG/ML IJ SOLN
0.2500 mg | INTRAMUSCULAR | Status: DC | PRN
  Administered 2015-09-20 (×2): 0.5 mg via INTRAVENOUS

## 2015-09-20 MED ORDER — CEFAZOLIN SODIUM-DEXTROSE 2-3 GM-% IV SOLR
INTRAVENOUS | Status: AC
  Filled 2015-09-20: qty 50

## 2015-09-20 MED ORDER — PROPOFOL 10 MG/ML IV BOLUS
INTRAVENOUS | Status: AC
  Filled 2015-09-20: qty 20

## 2015-09-20 MED ORDER — HYDROCODONE-ACETAMINOPHEN 5-325 MG PO TABS
1.0000 | ORAL_TABLET | ORAL | Status: DC | PRN
  Administered 2015-09-20: 2 via ORAL
  Administered 2015-09-20: 1 via ORAL
  Administered 2015-09-21 (×2): 2 via ORAL
  Filled 2015-09-20: qty 1
  Filled 2015-09-20 (×3): qty 2

## 2015-09-20 MED ORDER — RISAQUAD PO CAPS
1.0000 | ORAL_CAPSULE | Freq: Every day | ORAL | Status: DC
  Administered 2015-09-20 – 2015-09-21 (×2): 1 via ORAL
  Filled 2015-09-20 (×2): qty 1

## 2015-09-20 MED ORDER — ROCURONIUM BROMIDE 100 MG/10ML IV SOLN
INTRAVENOUS | Status: DC | PRN
  Administered 2015-09-20: 10 mg via INTRAVENOUS
  Administered 2015-09-20: 50 mg via INTRAVENOUS

## 2015-09-20 SURGICAL SUPPLY — 47 items
BAG SPEC THK2 15X12 ZIP CLS (MISCELLANEOUS)
BAG ZIPLOCK 12X15 (MISCELLANEOUS) IMPLANT
CLEANER TIP ELECTROSURG 2X2 (MISCELLANEOUS) ×2 IMPLANT
CLOTH 2% CHLOROHEXIDINE 3PK (PERSONAL CARE ITEMS) ×2 IMPLANT
DRAPE MICROSCOPE LEICA (MISCELLANEOUS) ×2 IMPLANT
DRAPE POUCH INSTRU U-SHP 10X18 (DRAPES) ×2 IMPLANT
DRAPE SHEET LG 3/4 BI-LAMINATE (DRAPES) ×2 IMPLANT
DRAPE SURG 17X11 SM STRL (DRAPES) ×2 IMPLANT
DRAPE UTILITY XL STRL (DRAPES) ×2 IMPLANT
DRSG AQUACEL AG ADV 3.5X 4 (GAUZE/BANDAGES/DRESSINGS) ×2 IMPLANT
DRSG AQUACEL AG ADV 3.5X 6 (GAUZE/BANDAGES/DRESSINGS) IMPLANT
DURAPREP 26ML APPLICATOR (WOUND CARE) ×2 IMPLANT
DURASEAL SPINE SEALANT 3ML (MISCELLANEOUS) IMPLANT
ELECT BLADE TIP CTD 4 INCH (ELECTRODE) IMPLANT
ELECT REM PT RETURN 9FT ADLT (ELECTROSURGICAL) ×2
ELECTRODE REM PT RTRN 9FT ADLT (ELECTROSURGICAL) ×1 IMPLANT
GLOVE BIOGEL PI IND STRL 7.0 (GLOVE) ×1 IMPLANT
GLOVE BIOGEL PI INDICATOR 7.0 (GLOVE) ×1
GLOVE SURG SS PI 7.0 STRL IVOR (GLOVE) ×2 IMPLANT
GLOVE SURG SS PI 7.5 STRL IVOR (GLOVE) ×2 IMPLANT
GLOVE SURG SS PI 8.0 STRL IVOR (GLOVE) ×4 IMPLANT
GOWN STRL REUS W/TWL XL LVL3 (GOWN DISPOSABLE) ×4 IMPLANT
IV CATH 14GX2 1/4 (CATHETERS) ×2 IMPLANT
KIT BASIN OR (CUSTOM PROCEDURE TRAY) ×2 IMPLANT
KIT POSITIONING SURG ANDREWS (MISCELLANEOUS) ×2 IMPLANT
MANIFOLD NEPTUNE II (INSTRUMENTS) ×2 IMPLANT
NEEDLE SPNL 18GX3.5 QUINCKE PK (NEEDLE) ×4 IMPLANT
PACK LAMINECTOMY ORTHO (CUSTOM PROCEDURE TRAY) ×2 IMPLANT
PATTIES SURGICAL .5 X.5 (GAUZE/BANDAGES/DRESSINGS) IMPLANT
PATTIES SURGICAL .75X.75 (GAUZE/BANDAGES/DRESSINGS) IMPLANT
PATTIES SURGICAL 1X1 (DISPOSABLE) IMPLANT
RUBBERBAND STERILE (MISCELLANEOUS) ×4 IMPLANT
SPONGE SURGIFOAM ABS GEL 100 (HEMOSTASIS) ×2 IMPLANT
STAPLER VISISTAT (STAPLE) IMPLANT
STRIP CLOSURE SKIN 1/2X4 (GAUZE/BANDAGES/DRESSINGS) ×2 IMPLANT
SUT NURALON 4 0 TR CR/8 (SUTURE) IMPLANT
SUT PROLENE 3 0 PS 2 (SUTURE) ×2 IMPLANT
SUT VIC AB 1 CT1 27 (SUTURE)
SUT VIC AB 1 CT1 27XBRD ANTBC (SUTURE) IMPLANT
SUT VIC AB 1-0 CT2 27 (SUTURE) ×2 IMPLANT
SUT VIC AB 2-0 CT1 27 (SUTURE)
SUT VIC AB 2-0 CT1 TAPERPNT 27 (SUTURE) IMPLANT
SUT VIC AB 2-0 CT2 27 (SUTURE) ×2 IMPLANT
SYR 3ML LL SCALE MARK (SYRINGE) IMPLANT
TOWEL OR 17X26 10 PK STRL BLUE (TOWEL DISPOSABLE) ×2 IMPLANT
TOWEL OR NON WOVEN STRL DISP B (DISPOSABLE) IMPLANT
YANKAUER SUCT BULB TIP NO VENT (SUCTIONS) IMPLANT

## 2015-09-20 NOTE — Transfer of Care (Signed)
Immediate Anesthesia Transfer of Care Note  Patient: Betty Golden  Procedure(s) Performed: Procedure(s): MICRO LUMBAR DECOMPRESSION L5-S1 BILATERAL    (1 LEVEL) (Bilateral)  Patient Location: PACU  Anesthesia Type:General  Level of Consciousness:  sedated, patient cooperative and responds to stimulation  Airway & Oxygen Therapy:Patient Spontanous Breathing and Patient connected to face mask oxgen  Post-op Assessment:  Report given to PACU RN and Post -op Vital signs reviewed and stable  Post vital signs:  Reviewed and stable  Last Vitals:  Filed Vitals:   09/20/15 1153  BP: 172/68    Complications: No apparent anesthesia complications

## 2015-09-20 NOTE — Op Note (Signed)
NAMEPRECILLA, Betty Golden NO.:  [id number]  MEDICAL RECORD NO.:  [id number]  LOCATION:  [facility]                         FACILITY:  [facility]  PHYSICIAN:  [name], M.D.    DATE OF BIRTH:  1946-05-03  DATE OF PROCEDURE:  09/20/2015 DATE OF DISCHARGE:                              OPERATIVE REPORT   PREOPERATIVE DIAGNOSIS:  Spinal stenosis, bilaterally L5-S1.  POSTOPERATIVE DIAGNOSIS:  Spinal stenosis, bilaterally L5-S1.  PROCEDURE PERFORMED:  Bilateral microlumbar decompression L5-S1 with bilateral hemilaminotomies, L5 and S1 foraminotomies.  ANESTHESIA:  General.  ASSISTANT:  Andrez Grime, PA.  HISTORY:  69, bilateral L5-S1 radiculopathy secondary to spinal stenosis laterally due to her increased lumbosacral angle loading on the L5-S1 facet joints which were hypertrophied.  She had a dynamic compression in the lateral recess to the L5-S1 nerve myotomal weakness, dermatomal dysesthesias, indicated for microlumbar decompression, L5 and S1.  Risk and benefits were discussed including bleeding, infection, damage to neurovascular structures, DVT, PE, anesthetic complications, etc.  TECHNIQUE:  Patient in supine position, after induction of adequate general anesthesia, 2 g Kefzol, placed prone on the Simpson frame.  All bony prominences were well padded.  Lumbar region was prepped and draped in usual sterile fashion.  Two 18-gauge spinal needle was utilized to localize the L5-S1 interspace confirmed with x-ray.  Incision was made in spinous process L5-S1, subcutaneous tissue was dissected. Electrocautery was utilized to achieve hemostasis.  A 0.25% Marcaine with epinephrine was infiltrated in a perimuscular tissue.  Confirmatory radiograph obtained.  Greggory Stallion was placed.  Operating microscope was draped and brought on the surgical field.  She had a fairly singled L5- S1 interspace.  With a hypertrophic facet which started on the right, used a Leksell to remove a  portion of the inferior lamina of 4, skeletonized the medial portion of the L5-S1 facet, used an osteotome to remove the 50% in the inferior process of 5, approximately 40% in the inferior process of 5.  This was removed with pituitary, detached ligamentum flavum from the cephalad edge of S1.  We performed a foraminotomy of S1 with a 2 mm Kerrison.  There was severe compression on the S1 nerve root in the lateral recess due to the facet hypertrophy. We decompressed the lateral recess to the medial border of the pedicle after I performed a foraminotomy of S1.  After then protecting the thecal sac in the L5 nerve root, we continued decompressing lateral recess, the medial border of the pedicle and then also performed a foraminotomy of 5.  There was stenosis there noted as well.  There was no disk herniation noted.  Bipolar electrocautery was utilized to achieve hemostasis.  A neural probe passed freely out the foramen of 5 and S1 and was confirmed with x-ray.  Good restoration of thecal sac without CSF leakage or active bleeding.  Again, there was no evidence of disk herniation.  I felt this decompressed the S1-5 nerve root satisfactorily.  Thrombin-soaked Gelfoam was placed in laminotomy defect and we moved to the left side.  McCulloch retractor was placed.  There was an absence of an interlaminar window, used an osteotome to remove portion of the facet, the inferior process of 5,  removed with the pituitary, the superior process of S1 was then skeletonized more medially and with removal of the portion of the interspinous ligament. The spinous process medially, we were able to obtain 2 mm Kerrison and removed the inferior process to the lamina of 5 from medial to lateral. I then identified the superior articulating process of S1.  Decompressed it to the medial border of the pedicle, we performed a foraminotomy of S1.  Hypertrophic ligamentum and the facet arthropathy was producing the mass  effect on the L5 and S1 nerve roots combined with shingling of that lumbosacral angle. once protected all times, performed foraminotomies of L5 and S1.  It is fairly hardened bone.  No disk herniation was noted. Bipolar electrocautery was utilized to achieve hemostasis.  Neural probe passed freely at the foramen of 5 and S1 following the decompression. Good restoration of thecal sac.  No CSF leakage or active bleeding, copiously irrigated the wound, placed thrombin-soaked Gelfoam in the laminotomy defect, we felt that we had addressed the pathology noted. McCulloch retractor was removed.  Paraspinous muscles irrigated.  No active bleeding, closed the fascia with 1 Vicryl, subcu with 2-0, and skin with subcuticular Prolene.  Sterile dressing applied, placed supine on the hospital bed, extubated without difficulty, and transported to the recovery room in satisfactory condition.  The patient tolerated the procedure well.  No complications.  Assistant, Andrez Grime, [place].  Blood loss 50 mL.     [name], M.D.     Cordelia Pen  D:  09/20/2015  T:  09/20/2015  Job:  [id number]

## 2015-09-20 NOTE — Anesthesia Preprocedure Evaluation (Signed)
Anesthesia Evaluation  Patient identified by MRN, date of birth, ID band Patient awake    Reviewed: Allergy & Precautions, H&P , Patient's Chart, lab work & pertinent test results, reviewed documented beta blocker date and time   Airway Mallampati: II  TM Distance: >3 FB Neck ROM: full    Dental no notable dental hx. (+) Edentulous Upper   Pulmonary asthma , COPD, former smoker,    Pulmonary exam normal breath sounds clear to auscultation       Cardiovascular hypertension, On Medications  Rhythm:regular Rate:Normal     Neuro/Psych    GI/Hepatic   Endo/Other    Renal/GU      Musculoskeletal   Abdominal   Peds  Hematology   Anesthesia Other Findings Hypertension   High cholesterol     Chronic headaches   Asthma    Shortness of breath  SOB with exertion Colitis  01/2015  Diverticulosis   Lung nodule  per CT stable/benign  Hypoxia   C. difficile colitis    HNP (herniated nucleus pulposus), lumbar   Arthritis    GERD (gastroesophageal reflux disease)   Depression       Reproductive/Obstetrics                             Anesthesia Physical Anesthesia Plan  ASA: II  Anesthesia Plan: General   Post-op Pain Management:    Induction: Intravenous  Airway Management Planned: Oral ETT  Additional Equipment:   Intra-op Plan:   Post-operative Plan: Extubation in OR  Informed Consent: I have reviewed the patients History and Physical, chart, labs and discussed the procedure including the risks, benefits and alternatives for the proposed anesthesia with the patient or authorized representative who has indicated his/her understanding and acceptance.   Dental Advisory Given and Dental advisory given  Plan Discussed with: CRNA and Surgeon  Anesthesia Plan Comments: (  Discussed general anesthesia, including possible nausea, instrumentation of airway, sore throat,pulmonary  aspiration, etc. I asked if the were any outstanding questions, or  concerns before we proceeded. )        Anesthesia Quick Evaluation

## 2015-09-20 NOTE — Anesthesia Postprocedure Evaluation (Signed)
Anesthesia Post Note  Patient: Betty Golden  Procedure(s) Performed: Procedure(s) (LRB): MICRO LUMBAR DECOMPRESSION L5-S1 BILATERAL    (1 LEVEL) (Bilateral)  Patient location during evaluation: PACU Anesthesia Type: General Level of consciousness: sedated Pain management: satisfactory to patient Vital Signs Assessment: post-procedure vital signs reviewed and stable Respiratory status: spontaneous breathing Cardiovascular status: stable Anesthetic complications: no    Last Vitals:  Filed Vitals:   09/20/15 1315 09/20/15 1415  BP:    Pulse:    Temp: 36.9 C 36.9 C  Resp:      Last Pain:  Filed Vitals:   09/20/15 1431  PainSc: 0-No pain                 Betty Golden

## 2015-09-20 NOTE — Brief Op Note (Signed)
09/20/2015  11:33 AM  PATIENT:  Betty Golden  70 y.o. female  PRE-OPERATIVE DIAGNOSIS:  HNP L5-S1 STEONSIS   POST-OPERATIVE DIAGNOSIS:  HNP L5-S1 STEONSIS   PROCEDURE:  Procedure(s): MICRO LUMBAR DECOMPRESSION L5-S1 BILATERAL    (1 LEVEL) (Bilateral)  SURGEON:  Surgeon(s) and Role:    * [name], MD - Primary  PHYSICIAN ASSISTANT:   ASSISTANTS: [place]   ANESTHESIA:   general  EBL:  Total I/O In: 1000 [I.V.:1000] Out: 300 [Urine:300]  BLOOD ADMINISTERED:none  DRAINS: none   LOCAL MEDICATIONS USED:  MARCAINE     SPECIMEN:  No Specimen  DISPOSITION OF SPECIMEN:  N/A  COUNTS:  YES  TOURNIQUET:  * No tourniquets in log *  DICTATION: .Other Dictation: Dictation Number [id number]  PLAN OF CARE: Admit for overnight observation  PATIENT DISPOSITION:  PACU - hemodynamically stable.   Delay start of Pharmacological VTE agent (>24hrs) due to surgical blood loss or risk of bleeding: yes

## 2015-09-20 NOTE — Anesthesia Procedure Notes (Addendum)
Procedure Name: Intubation Date/Time: 09/20/2015 9:33 AM Performed by: Army Fossa Pre-anesthesia Checklist: Patient identified, Emergency Drugs available, Suction available, Patient being monitored and Timeout performed Patient Re-evaluated:Patient Re-evaluated prior to inductionOxygen Delivery Method: Circle system utilized Preoxygenation: Pre-oxygenation with 100% oxygen Intubation Type: IV induction Laryngoscope Size: Miller and 3 Grade View: Grade I Tube size: 7.0 mm Number of attempts: 1 (Performed by Christen Butter SRNA. ) Airway Equipment and Method: Patient positioned with wedge pillow and Stylet Placement Confirmation: ETT inserted through vocal cords under direct vision,  breath sounds checked- equal and bilateral,  positive ETCO2 and CO2 detector Secured at: 22 cm Tube secured with: Tape Dental Injury: Teeth and Oropharynx as per pre-operative assessment and Injury to lip

## 2015-09-20 NOTE — Discharge Instructions (Signed)
Walk As Tolerated utilizing back precautions.  No bending, twisting, or lifting.  No driving for 2 weeks.   °Aquacel dressing may remain in place until follow up. May shower with aquacel dressing in place. If the dressing peels off or becomes saturated, you may remove aquacel dressing and place gauze and tape dressing which should be kept clean and dry and changed daily. Do not remove steri-strips if they are present. °See Dr. Madden Garron in office in 10 to 14 days. Begin taking aspirin 81mg per day starting 4 days after your surgery if not allergic to aspirin or on another blood thinner. °Walk daily even outside. Use a cane or walker only if necessary. °Avoid sitting on soft sofas. ° °

## 2015-09-21 ENCOUNTER — Encounter (HOSPITAL_COMMUNITY): Payer: Self-pay | Admitting: Specialist

## 2015-09-21 DIAGNOSIS — M4807 Spinal stenosis, lumbosacral region: Secondary | ICD-10-CM | POA: Diagnosis not present

## 2015-09-21 LAB — BASIC METABOLIC PANEL
Anion gap: 9 (ref 5–15)
BUN: 10 mg/dL (ref 6–20)
CALCIUM: 9.1 mg/dL (ref 8.9–10.3)
CHLORIDE: 105 mmol/L (ref 101–111)
CO2: 27 mmol/L (ref 22–32)
CREATININE: 0.79 mg/dL (ref 0.44–1.00)
GFR calc non Af Amer: >60 mL/min (ref >60)
Glucose, Bld: 109 mg/dL — ABNORMAL HIGH (ref 65–99)
Potassium: 4 mmol/L (ref 3.5–5.1)
SODIUM: 141 mmol/L (ref 135–145)

## 2015-09-21 MED ORDER — ACETAMINOPHEN 500 MG PO TABS: 500.0000 mg | ORAL_TABLET | Freq: Four times a day (QID) | ORAL | Status: DC | PRN

## 2015-09-21 NOTE — Discharge Summary (Signed)
Physician Discharge Summary   Patient ID: Betty Golden MRN: [id number] DOB/AGE: [birth year] 70 y.o.  Admit date: 09/20/2015 Discharge date: 09/21/2015  Primary Diagnosis:   HNP L5-S1 STEONSIS   Admission Diagnoses:  Past Medical History  Diagnosis Date  . Hypertension   . High cholesterol   . Chronic headaches   . Asthma   . Shortness of breath     SOB with exertion  . Colitis     01/2015  . Diverticulosis   . Lung nodule     per CT stable/benign  . Hypoxia   . C. difficile colitis   . HNP (herniated nucleus pulposus), lumbar   . Arthritis   . GERD (gastroesophageal reflux disease)   . Depression    Discharge Diagnoses:   Principal Problem:   Spinal stenosis of lumbar region  Procedure:  Procedure(s) (LRB): MICRO LUMBAR DECOMPRESSION L5-S1 BILATERAL    (1 LEVEL) (Bilateral)   Consults: None  HPI:  see H&P    Laboratory Data: Hospital Outpatient Visit on 09/12/2015  Component Date Value Ref Range Status  . Sodium 09/12/2015 145  135 - 145 mmol/L Final  . Potassium 09/12/2015 4.5  3.5 - 5.1 mmol/L Final  . Chloride 09/12/2015 105  101 - 111 mmol/L Final  . CO2 09/12/2015 31  22 - 32 mmol/L Final  . Glucose, Bld 09/12/2015 99  65 - 99 mg/dL Final  . BUN 09/12/2015 13  6 - 20 mg/dL Final  . Creatinine, Ser 09/12/2015 0.87  0.44 - 1.00 mg/dL Final  . Calcium 09/12/2015 9.8  8.9 - 10.3 mg/dL Final  . GFR calc non Af Amer 09/12/2015 >60  >60 mL/min Final  . GFR calc Af Amer 09/12/2015 >60  >60 mL/min Final   Comment: (NOTE) The eGFR has been calculated using the CKD EPI equation. This calculation has not been validated in all clinical situations. eGFR's persistently <60 mL/min signify possible Chronic Kidney Disease.   . Anion gap 09/12/2015 9  5 - 15 Final  . WBC 09/12/2015 12.1* 4.0 - 10.5 K/uL Final  . RBC 09/12/2015 4.37  3.87 - 5.11 MIL/uL Final  . Hemoglobin 09/12/2015 11.8* 12.0 - 15.0 g/dL Final  . HCT 09/12/2015 37.9  36.0 - 46.0 % Final  .  MCV 09/12/2015 86.7  78.0 - 100.0 fL Final  . MCH 09/12/2015 27.0  26.0 - 34.0 pg Final  . MCHC 09/12/2015 31.1  30.0 - 36.0 g/dL Final  . RDW 09/12/2015 13.9  11.5 - 15.5 % Final  . Platelets 09/12/2015 307  150 - 400 K/uL Final  . MRSA, PCR 09/12/2015 NEGATIVE  NEGATIVE Final  . Staphylococcus aureus 09/12/2015 NEGATIVE  NEGATIVE Final   Comment:        The Xpert SA Assay (FDA approved for NASAL specimens in patients over 90 years of age), is one component of a comprehensive surveillance program.  Test performance has been validated by [facility] for patients greater than or equal to 12 year old. It is not intended to diagnose infection nor to guide or monitor treatment.    No results for input(s): HGB in the last 72 hours. No results for input(s): WBC, RBC, HCT, PLT in the last 72 hours.  Recent Labs  09/21/15 0421  NA 141  K 4.0  CL 105  CO2 27  BUN 10  CREATININE 0.79  GLUCOSE 109*  CALCIUM 9.1   No results for input(s): LABPT, INR in the last 72 hours.  X-Rays:Dg Lumbar Spine  2-3 Views  09/12/2015  CLINICAL DATA:  Preoperative evaluation for lumbar decompression at L5-S1 EXAM: LUMBAR SPINE - 2-3 VIEW COMPARISON:  03/09/2015 FINDINGS: Five lumbar type vertebral bodies are well visualized. Vertebral body height well maintained. Mild osteophytic changes are seen. No compression deformities are noted. No anterolisthesis is noted. Mild aortic calcifications are seen. IMPRESSION: Degenerative change without acute abnormality. Electronically Signed   By: Inez Catalina M.D.   On: 09/12/2015 09:45   Dg Spine Portable 1 View  09/20/2015  CLINICAL DATA:  70 year old female undergoing lumbar spine surgery. Initial encounter. EXAM: PORTABLE SPINE - 1 VIEW COMPARISON:  1004 hours today and earlier. FINDINGS: Intraoperative portable cross-table lateral view of the lumbar spine labeled image # 3 at 1033 hours. 2 surgical probes now project about the posterior L5-S1 disc space.  IMPRESSION: Intraoperative localization at L5-S1. Electronically Signed   By: Genevie Ann M.D.   On: 09/20/2015 10:56   Dg Spine Portable 1 View  09/20/2015  CLINICAL DATA:  70 year old female undergoing lumbar spine surgery. Initial encounter. EXAM: PORTABLE SPINE - 1 VIEW COMPARISON:  0948 hours today and earlier. FINDINGS: Intraoperative portable cross-table lateral view of the lumbar spine labeled image #2 at 1004 hours. Surgical probe now directed at the mid S1 level. IMPRESSION: Intraoperative localization at S1. Electronically Signed   By: Genevie Ann M.D.   On: 09/20/2015 10:49   Dg Spine Portable 1 View  09/20/2015  CLINICAL DATA:  70 year old female undergoing lumbar spine surgery. Initial encounter. EXAM: PORTABLE SPINE - 1 VIEW COMPARISON:  Preoperative lumbar radiographs 09/12/2015. CT Abdomen and Pelvis 03/09/2015. FINDINGS: Normal lumbar segmentation demonstrated on the recent radiographs. Intraoperative portable cross-table lateral view of the lumbar spine labeled #1 at 0948 hours. Cephalad needle directed between the L3 and L4 spinous processes. Caudal needle directed between the L4 and L5 spinous processes. IMPRESSION: Intraoperative localization as above. Electronically Signed   By: Genevie Ann M.D.   On: 09/20/2015 10:07    EKG: Orders placed or performed during the hospital encounter of 03/09/15  . ED EKG  . ED EKG  . EKG 12-Lead  . EKG 12-Lead  . EKG     Hospital Course: Patient was admitted to [facility] and taken to the OR and underwent the above state procedure without complications.  Patient tolerated the procedure well and was later transferred to the recovery room and then to the orthopaedic floor for postoperative care.  They were given PO and IV analgesics for pain control following their surgery.  They were given 24 hours of postoperative antibiotics.   PT was consulted postop to assist with mobility and transfers.  The patient was allowed to be WBAT with therapy and  was taught back precautions. Discharge planning was consulted to help with postop disposition and equipment needs.  Patient had a good night on the evening of surgery and started to get up OOB with therapy on day one. Patient was seen in rounds and was ready to go home on day one.  They were given discharge instructions and dressing directions.  They were instructed on when to follow up in the office with Dr. Tonita Cong.   Diet: Regular diet Activity:WBAT; Lspine precautions Follow-up:in 10-14 days Disposition - Home Discharged Condition: good   Discharge Instructions    Call MD / Call 911    Complete by:  As directed   If you experience chest pain or shortness of breath, CALL 911 and be transported to the hospital emergency room.  If you develope a fever above 101 F, pus (white drainage) or increased drainage or redness at the wound, or calf pain, call your surgeon's office.     Constipation Prevention    Complete by:  As directed   Drink plenty of fluids.  Prune juice may be helpful.  You may use a stool softener, such as Colace (over the counter) 100 mg twice a day.  Use MiraLax (over the counter) for constipation as needed.     Diet - low sodium heart healthy    Complete by:  As directed      Increase activity slowly as tolerated    Complete by:  As directed             Medication List    TAKE these medications        acetaminophen 500 MG tablet  Commonly known as:  TYLENOL  Take 1 tablet (500 mg total) by mouth every 6 (six) hours as needed for mild pain. Do not exceed 4 grams of acetaminophen daily in combination with Norco     Azelastine HCl 0.15 % Soln  Place 2 sprays into the nose at bedtime.     clobetasol cream 0.05 %  Commonly known as:  TEMOVATE  Apply 1 application topically 2 (two) times daily as needed. For rash (not to face, groin, or underarms)     docusate sodium 100 MG capsule  Commonly known as:  COLACE  Take 1 capsule (100 mg total) by mouth 2 (two) times  daily as needed for mild constipation.     DULERA 200-5 MCG/ACT Aero  Generic drug:  mometasone-formoterol  INHALE 2 PUFFS TWICE A DAY. RINSE, GARGLE, AND SPIT AFTER EACH USE     fexofenadine 180 MG tablet  Commonly known as:  ALLEGRA  Take 180 mg by mouth daily.     FLUoxetine 20 MG capsule  Commonly known as:  PROZAC  Take 20 mg by mouth daily.     HYDROcodone-acetaminophen 5-325 MG tablet  Commonly known as:  NORCO/VICODIN  Take 1-2 tablets by mouth every 4 (four) hours as needed for severe pain.     losartan-hydrochlorothiazide 100-12.5 MG tablet  Commonly known as:  HYZAAR  Take 1 tablet by mouth daily.     mometasone 50 MCG/ACT nasal spray  Commonly known as:  NASONEX  Place 2 sprays into both nostrils daily.     montelukast 10 MG tablet  Commonly known as:  SINGULAIR  Take 10 mg by mouth daily at 6 PM.     omeprazole 20 MG capsule  Commonly known as:  PRILOSEC  Take 1 capsule (20 mg total) by mouth daily.     PROAIR RESPICLICK 103 (90 Base) MCG/ACT Aepb  Generic drug:  Albuterol Sulfate  Inhale 2 puffs into the lungs every 4 (four) hours as needed (cough/wheezing).     simvastatin 20 MG tablet  Commonly known as:  ZOCOR  Take 20 mg by mouth at bedtime.     traMADol 50 MG tablet  Commonly known as:  ULTRAM  Take 1 tablet (50 mg total) by mouth every 6 (six) hours as needed for moderate pain.     tretinoin 0.05 % cream  Commonly known as:  RETIN-A  Apply 1 application topically at bedtime.           Follow-up Information    Follow up with BEANE,JEFFREY C, MD In 2 weeks.   Specialty:  Orthopedic Surgery   Why:  For suture  removal   Contact information:   [address] [place] [address] [phone]       Signed: Lacie Draft, PA-C Orthopaedic Surgery 09/21/2015, 10:28 AM

## 2015-09-21 NOTE — Evaluation (Addendum)
Occupational Therapy Evaluation AND Discharge  Patient Details Name: Betty Golden MRN: [id number] DOB: 1946-06-07 Today's Date: 09/21/2015    History of Present Illness L5-S1 decompression   Clinical Impression   Patient admitted with above. Patient independent PTA. Patient currently functioning at an overall supervision to mod I level.  No additional OT needs identified, D/C from acute OT services and no additional follow-up OT needs at this time. All appropriate education provided to patient and family (daughter). Please re-order OT if needed.      Follow Up Recommendations  No OT follow up;Supervision - Intermittent    Equipment Recommendations  3 in 1 bedside comode;Other (comment) (LH sponge)    Recommendations for Other Services  None at this time    Precautions / Restrictions Precautions Precautions: Back Precaution Booklet Issued: Yes (comment) Precaution Comments: reviewed back precautions  Restrictions Weight Bearing Restrictions: No    Mobility Bed Mobility Overal bed mobility: Needs Assistance Bed Mobility: Rolling;Sidelying to Sit Rolling: Supervision Sidelying to sit: Supervision General bed mobility comments: Pt seated in recliner upon OT entering/exiting room  Transfers Overall transfer level: Needs assistance Equipment used: Rolling walker (2 wheeled) Transfers: Sit to/from Stand Sit to Stand: Supervision General transfer comment: verbal cues for hand placement    Balance Overall balance assessment: No apparent balance deficits (not formally assessed) (Pt standing with no UE support to don bra and shirts)    ADL Overall ADL's : Needs assistance/impaired General ADL Comments: Pt overall supervision to modified independent with ADLs. Edcuated pt on use of reacher and LH sponge to aide in ADL and IADL independence. Pt completed UB/LB dressing in sit to/from stand position. Pt stood at sink for grooming tasks, educated pt on compensatory strategies  for grooming tasks at sink to ensure she did not break her back precautions. Pt did require cues to step closer to sink to prevent from bending over sink. Pt able to catch herself if she was breaking precautions most the time before therapist had to provide VCs.    Pertinent Vitals/Pain Pain Assessment: Faces Pain Score: 7  Faces Pain Scale: Hurts little more Pain Location: back incision Pain Descriptors / Indicators: Sore Pain Intervention(s): Monitored during session     Hand Dominance Right   Extremity/Trunk Assessment Upper Extremity Assessment Upper Extremity Assessment: Overall WFL for tasks assessed   Lower Extremity Assessment Lower Extremity Assessment: Defer to PT evaluation   Cervical / Trunk Assessment Cervical / Trunk Assessment: Normal   Communication Communication Communication: No difficulties   Cognition Arousal/Alertness: Awake/alert Behavior During Therapy: WFL for tasks assessed/performed Overall Cognitive Status: Within Functional Limits for tasks assessed              Home Living Family/patient expects to be discharged to:: Private residence Living Arrangements: Children Available Help at Discharge: Family;Available 24 hours/day   Home Access: Stairs to enter [employer] of Steps: 2 Entrance Stairs-Rails: None Home Layout: One level     Bathroom Shower/Tub: Tub/shower unit;Curtain   Bathroom Toilet: Handicapped height     Home Equipment: Cane - single point   Prior Functioning/Environment Level of Independence: Needs assistance  Gait / Transfers Assistance Needed: used cane and had HHA of 1 when walking due to multiple falls    OT Diagnosis: Generalized weakness;Acute pain   OT Problem List:   N/a, no acute OT needs identified at this time     OT Treatment/Interventions:   N/a, no acute OT needs identified at this time  OT Goals(Current goals can be found in the care plan section) Acute Rehab OT Goals Patient Stated  Goal: go home today OT Goal Formulation: All assessment and education complete, DC therapy  OT Frequency:   N/a, no acute OT needs identified at this time     Barriers to D/C:  none known at this time    End of Session Equipment Utilized During Treatment: Rolling walker  Activity Tolerance: Patient tolerated treatment well Patient left: in chair;with call bell/phone within reach;with family/visitor present   Time: 2202-5427 OT Time Calculation (min): 20 min Charges:  OT General Charges $OT Visit: 1 Procedure OT Evaluation $OT Eval Low Complexity: 1 Procedure G-Codes: OT G-codes **NOT FOR INPATIENT CLASS** Functional Limitation: Self care Self Care Current Status (C6237): At least 1 percent but less than 20 percent impaired, limited or restricted (supervision to mod I) Self Care Goal Status [phone]): At least 1 percent but less than 20 percent impaired, limited or restricted (supervision to mod I) Self Care Discharge Status [phone]): At least 1 percent but less than 20 percent impaired, limited or restricted (supervision to mod I)  Edwin Cap , MS, OTR/L, CLT Pager: [phone]  09/21/2015, 11:00 AM

## 2015-09-21 NOTE — Progress Notes (Signed)
[facility]    [facility] is providing the following services:RW and [facility]mode  If patient discharges after hours, please call [phone].   Renard Hamper 09/21/2015, 1:06 PM

## 2015-09-21 NOTE — Care Management Note (Signed)
Case Management Note  Patient Details  Name: Betty Golden MRN: [id number] Date of Birth: 26-Oct-1945  Subjective/Objective:                  HNP L5-S1 STEONSIS  Action/Plan: Discharge planning Expected Discharge Date:  09/21/15               Expected Discharge Plan:  Home/Self Care  In-House Referral:     Discharge planning Services  CM Consult  Post Acute Care Choice:    Choice offered to:  Patient  DME Arranged:  3-N-1, Walker rolling DME Agency:  [place]:    [place]:     Status of Service:  Completed, signed off  Medicare Important Message Given:    Date Medicare IM Given:    Medicare IM give by:    Date Additional Medicare IM Given:    Additional Medicare Important Message give by:     If discussed at [place] of Stay Meetings, dates discussed:    Additional Comments: CM met with pt to confirm reported WC; pt states she is not WC and is retired.  CM called [facility] DME rep, Lecretia to please deliver rolling walker and 3n1 to room so pt can discharge.  No HH services recommended or ordered.  No other CM needs were communicated. Dellie Catholic, RN 09/21/2015, 11:57 AM

## 2015-09-21 NOTE — Evaluation (Addendum)
Physical Therapy Evaluation Patient Details Name: Betty Golden MRN: [id number] DOB: 1945-12-25 Today's Date: 09/21/2015   History of Present Illness  L5-S1 decompression  Clinical Impression  Pt is modified independent with mobility, she ambulated 180' with RW, performed log roll, transfers and completed stair training. Pt/daughter instructed in back precautions. She's ready to DC home from PT standpoint. Encouraged frequent ambulation.        Follow Up Recommendations No PT follow up    Equipment Recommendations  Rolling walker with 5" wheels;3in1 (PT)    Recommendations for Other Services OT consult     Precautions / Restrictions Precautions Precautions: Back Precaution Booklet Issued: Yes (comment) Restrictions Weight Bearing Restrictions: No      Mobility  Bed Mobility Overal bed mobility: Needs Assistance Bed Mobility: Rolling;Sidelying to Sit Rolling: Supervision Sidelying to sit: Supervision       General bed mobility comments: verbal cues for log roll technique, no physical assist  Transfers Overall transfer level: Needs assistance Equipment used: Rolling walker (2 wheeled) Transfers: Sit to/from Stand Sit to Stand: Supervision         General transfer comment: verbal cues for hand placement  Ambulation/Gait Ambulation/Gait assistance: Supervision Ambulation Distance (Feet): 180 Feet Assistive device: Rolling walker (2 wheeled) Gait Pattern/deviations: Step-to pattern   Gait velocity interpretation: Below normal speed for age/gender General Gait Details: steady with RW, no LOB  Stairs Stairs: Yes Stairs assistance: Min assist Stair Management: No rails;With walker;Step to pattern;Backwards Number of Stairs: 2 General stair comments: verbal cues for technique, daughter assisted with management of RW  Wheelchair Mobility    Modified Rankin (Stroke Patients Only)       Balance Overall balance assessment: History of Falls;Modified  Independent                                           Pertinent Vitals/Pain Pain Assessment: 0-10 Pain Score: 7  Pain Location: back incision, no radiating pain Pain Descriptors / Indicators: Sore Pain Intervention(s): Premedicated before session;Monitored during session;Limited activity within patient's tolerance    Home Living Family/patient expects to be discharged to:: Private residence Living Arrangements: Children Available Help at Discharge: Family;Available 24 hours/day   Home Access: Stairs to enter Entrance Stairs-Rails: None Entrance Stairs-Number of Steps: 2 Home Layout: One level Home Equipment: Cane - single point      Prior Function Level of Independence: Needs assistance   Gait / Transfers Assistance Needed: used cane and had HHA of 1 when walking due to multiple falls           Hand Dominance        Extremity/Trunk Assessment   Upper Extremity Assessment: Overall WFL for tasks assessed           Lower Extremity Assessment: Overall WFL for tasks assessed (sensation intact to light touch BLEs)      Cervical / Trunk Assessment: Normal  Communication   Communication: No difficulties  Cognition Arousal/Alertness: Awake/alert Behavior During Therapy: WFL for tasks assessed/performed Overall Cognitive Status: Within Functional Limits for tasks assessed                      General Comments      Exercises        Assessment/Plan    PT Assessment Patent does not need any further PT services  PT Diagnosis Acute pain   PT  Problem List    PT Treatment Interventions     PT Goals (Current goals can be found in the Care Plan section) Acute Rehab PT Goals Patient Stated Goal: to walk without falling PT Goal Formulation: All assessment and education complete, DC therapy    Frequency     Barriers to discharge        Co-evaluation               End of Session Equipment Utilized During Treatment: Gait  belt Activity Tolerance: Patient tolerated treatment well;No increased pain Patient left: in chair;with call bell/phone within reach Nurse Communication: Mobility status         Time: 0920-0959 PT Time Calculation (min) (ACUTE ONLY): 39 min   Charges:   PT Evaluation $PT Eval Low Complexity: 1 Procedure PT Treatments $Gait Training: 8-22 mins $Therapeutic Activity: 8-22 mins   PT G Codes:  PT G-Codes **NOT FOR INPATIENT CLASS** Functional Assessment Tool Used clinical judgement clinical judgement at 1011 on 09/21/15 by Alvester Morin, PT Functional Limitation Mobility: Walking and moving around Mobility: Walking and moving around at 1011 on 09/21/15 by Alvester Morin, PT Mobility: Walking and Moving Around Current Status [phone]) At least 1 percent but less than 20 percent impaired, limited or restricted CI at 1011 on 09/21/15 by Alvester Morin, PT Mobility: Walking and Moving Around Goal Status [phone]) At least 1 percent but less than 20 percent impaired, limited or restricted CI at 1011 on 09/21/15 by Alvester Morin, PT Mobility: Walking and Moving Around Discharge Status [phone]) At least 1 percent but less than 20 percent impaired, limited or restricted CI at 1011 on 09/21/15 by Alvester Morin, PT       Ralene Bathe Kistler 09/21/2015, 10:12 AM [phone]

## 2015-09-21 NOTE — Progress Notes (Signed)
Subjective: 1 Day Post-Op Procedure(s) (LRB): MICRO LUMBAR DECOMPRESSION L5-S1 BILATERAL    (1 LEVEL) (Bilateral) Patient reports pain as 4 on 0-10 scale.    Objective: Vital signs in last 24 hours: Temp:  [97.4 F (36.3 C)-98.5 F (36.9 C)] 97.6 F (36.4 C) (02/23 0500) Pulse Rate:  [71-96] 77 (02/23 0500) Resp:  [10-19] 17 (02/23 0500) BP: (95-172)/(43-79) 118/50 mmHg (02/23 0500) SpO2:  [100 %] 100 % (02/23 0500) Weight:  [73.029 kg (161 lb)] 73.029 kg (161 lb) (02/22 1542)  Intake/Output from previous day: 02/22 0701 - 02/23 0700 In: 3201.7 [P.O.:840; I.V.:2361.7] Out: 2250 [Urine:2150; Blood:100] Intake/Output this shift:    No results for input(s): HGB in the last 72 hours. No results for input(s): WBC, RBC, HCT, PLT in the last 72 hours.  Recent Labs  09/21/15 0421  NA 141  K 4.0  CL 105  CO2 27  BUN 10  CREATININE 0.79  GLUCOSE 109*  CALCIUM 9.1   No results for input(s): LABPT, INR in the last 72 hours.  Neurologically intact Sensation intact distally Intact pulses distally Dorsiflexion/Plantar flexion intact Incision: dressing C/D/I  Assessment/Plan: 1 Day Post-Op Procedure(s) (LRB): MICRO LUMBAR DECOMPRESSION L5-S1 BILATERAL    (1 LEVEL) (Bilateral) Up with therapy Plan for discharge tomorrow  Wants to stay  Thurmond Hildebran C 09/21/2015, 7:43 AM

## 2015-10-24 ENCOUNTER — Ambulatory Visit (HOSPITAL_COMMUNITY): Payer: Medicare Other | Attending: Specialist | Admitting: Physical Therapy

## 2015-10-24 DIAGNOSIS — R262 Difficulty in walking, not elsewhere classified: Secondary | ICD-10-CM

## 2015-10-24 DIAGNOSIS — R29898 Other symptoms and signs involving the musculoskeletal system: Secondary | ICD-10-CM | POA: Diagnosis present

## 2015-10-24 DIAGNOSIS — M545 Low back pain, unspecified: Secondary | ICD-10-CM

## 2015-10-24 NOTE — Therapy (Signed)
[facility] [address] [place], [place], [address] Phone: [phone]   Fax:  [phone]  Physical Therapy Evaluation  Patient Details  Name: Betty Golden MRN: [id number] Date of Birth: January 05, 1946 Referring Provider: Leandra Golden  Encounter Date: 10/24/2015      PT End of Session - 10/24/15 1515    Visit Number 1   Number of Visits 8   Date for PT Re-Evaluation 11/23/15   Authorization Type [employer] medicare   Authorization - Visit Number 1   Authorization - Number of Visits 8   PT Start Time 1430   PT Stop Time 1515   PT Time Calculation (min) 45 min   Activity Tolerance Patient tolerated treatment well   Behavior During Therapy [facility] for tasks assessed/performed      Past Medical History  Diagnosis Date  . Hypertension   . High cholesterol   . Chronic headaches   . Asthma   . Shortness of breath     SOB with exertion  . Colitis     01/2015  . Diverticulosis   . Lung nodule     per CT stable/benign  . Hypoxia   . C. difficile colitis   . HNP (herniated nucleus pulposus), lumbar   . Arthritis   . GERD (gastroesophageal reflux disease)   . Depression     Past Surgical History  Procedure Laterality Date  . Appendectomy    . Cholecystectomy    . Breast reduction surgery    . Tonsillectomy    . Colonoscopy N/A 08/19/2013    Procedure: COLONOSCOPY;  Surgeon: Betty Hippo, MD;  Location: [facility];  Service: Endoscopy;  Laterality: N/A;  155-moved to 140 Ann to notify pt  . Lumbar laminectomy/decompression microdiscectomy Bilateral 09/20/2015    Procedure: MICRO LUMBAR DECOMPRESSION L5-S1 BILATERAL    (1 LEVEL);  Surgeon: Betty Every, MD;  Location: [facility] ORS;  Service: Orthopedics;  Laterality: Bilateral;    There were no vitals filed for this visit.  Visit Diagnosis:  Difficulty walking - Plan: PT plan of care cert/re-cert  Bilateral leg weakness - Plan: PT plan of care cert/re-cert  Midline low back  pain without sciatica - Plan: PT plan of care cert/re-cert      Subjective Assessment - 10/24/15 1433    Subjective [name] states that she opted to have a microdisectomy on her back on February 22nd after having four months of progressive back pain.  She states that she is doing better since the surgery.  She states that Golden now and then she will have a "pinch " of paiin when she is doing too much.    Pertinent History HTN,    How long can you sit comfortably? Pt is able to sit for an hour.    How long can you stand comfortably? Pt is able to stand for 30 mintues    How long can you walk comfortably? Pt is not walking at this time.    Patient Stated Goals To learn proper body mechanics so she doesn't reinjure her back; learn exercises for her back    Currently in Pain? No/denies  will occasionally go up to a 3-4             [facility] PT Assessment - 10/24/15 0001    Assessment   Medical Diagnosis s/p lumbar microdisectomy   Referring Provider Betty Golden   Onset Date/Surgical Date 09/20/15   Next MD Visit 11/22/2015   Prior  Therapy none    Precautions   Precautions Back   Precaution Comments surgery   Restrictions   Weight Bearing Restrictions No   Balance Screen   Has the patient fallen in the past 6 months Yes   Has the patient had a decrease in activity level because of a fear of falling?  Yes   Is the patient reluctant to leave their home because of a fear of falling?  No   Home Environment   Living Environment Private residence   Type of Home House   Home Access Level entry   Home Layout Able to live on main level with bedroom/bathroom   Prior Function   Level of Independence Independent   Vocation Retired   Leisure walking , working Arboriculturist   Overall Cognitive Status Within Functional Limits for tasks assessed   Observation/Other Assessments   Focus on Therapeutic Outcomes (FOTO)  47   Posture/Postural Control   Posture/Postural Control Postural  limitations   Postural Limitations Rounded Shoulders;Forward head;Decreased lumbar lordosis;Increased thoracic kyphosis   ROM / Strength   AROM / PROM / Strength AROM;Strength   AROM   AROM Assessment Site Lumbar   Lumbar Extension 12   Lumbar - Right Side Bend 12   Lumbar - Left Side Bend 5   Strength   Strength Assessment Site Hip;Knee;Ankle   Right/Left Hip Right;Left   Right Hip Flexion 4/5   Right Hip Extension 3-/5   Right Hip ABduction 3+/5   Left Hip Flexion 5/5   Left Hip Extension 3-/5   Right/Left Knee Right;Left   Right Knee Flexion 3+/5   Right Knee Extension 4+/5   Left Knee Flexion 4/5   Left Knee Extension 5/5   Right/Left Ankle Right;Left   Right Ankle Dorsiflexion 3/5   Left Ankle Dorsiflexion 3/5                   [facility] Adult PT Treatment/Exercise - 10/24/15 0001    Exercises   Exercises Lumbar   Lumbar Exercises: Stretches   Standing Extension 5 reps   Lumbar Exercises: Seated   Other Seated Lumbar Exercises scapular and cervical retracton x 10    Other Seated Lumbar Exercises abdominal isometric x 10                 PT Education - 10/24/15 1512    Education provided Yes   Education Details HEP; body mechanics   Person(s) Educated Patient   Methods Explanation;Demonstration;Handout   Comprehension Verbalized understanding          PT Short Term Goals - 10/24/15 1613    PT SHORT TERM GOAL #1   Title Pt to be able to demonstrate proper body mechanics for changing body position in order to prevent future back injury    Time 2   Period Weeks   Status New   PT SHORT TERM GOAL #2   Title Pt to demonsrate good sitting posture and verbalize the importance of good posture in back care.    Time 2   Period Weeks   Status New   PT SHORT TERM GOAL #3   Title Pt core strength to improve to allow pt to be able to complete bed mobility witnout difficulty or increased pain    Time 2   Period Weeks   Status New   PT SHORT TERM GOAL  #4   Title Pt to state that she is walking 15 minutes a day for improved  back care and health    Time 2   Period Weeks   Status New           PT Long Term Goals - November 16, 2015 1618    PT LONG TERM GOAL #1   Title Pt lower extremity strength to be increased one grade bilaterally to allow pt to get in and out of low cars without difficulty   Time 4   Period Weeks   Status New   PT LONG TERM GOAL #2   Title Pt pain to be no greater than a 1/10 with standing/ walking activittes for over 2 hours to allow pt to shop without discomfort    Time 4   Period Weeks   Status New   PT LONG TERM GOAL #3   Title Pt to state that she is walking at least 30 mintues five times a week for improved self back care and healty habits.    Time 4   Period Weeks   Status New   PT LONG TERM GOAL #4   Title Pt core strength to improve so that patient can vocalized that she is able to carry groceries into the house with confidence and no increased pain.    Time 4   Period Weeks               Plan - 2015-11-16 1516    Clinical Impression Statement Ms. Petron is a 70 yo female who had a microdisectomy on 09/20/2015. She has been referred to skilled physical therapy by her surgeon for education in body mechanics and core strengthening.  Evaluation demonstrates decreased activtiy tolerance, decreased ROM, decreased strength and increased pain.  Ms. Lumia will benefit from skilled physical therapy to address these issues and maximize her functional potential.    Pt will benefit from skilled therapeutic intervention in order to improve on the following deficits Decreased activity tolerance;Decreased range of motion;Decreased strength;Pain;Postural dysfunction;Improper body mechanics   Rehab Potential Good   PT Frequency 2x / week   PT Duration 4 weeks   PT Treatment/Interventions ADLs/Self Care Home Management;Gait training;Stair training;Functional mobility training;Therapeutic activities;Therapeutic  exercise;Balance training;Neuromuscular re-education;Patient/family education;Scar mobilization;Manual techniques   PT Next Visit Plan begin stabilization program including bent knee lift, SLR, sidelying abduction, prone heel squeeze. Stretches for hip flexors and hamstring mm.    PT Home Exercise Plan given    Consulted and Agree with Plan of Care Patient          G-Codes - 16-Nov-2015 1626    Functional Limitation Other PT primary   Other PT Primary Current Status (C9449) At least 40 percent but less than 60 percent impaired, limited or restricted   Other PT Primary Goal Status (Q7591) At least 40 percent but less than 60 percent impaired, limited or restricted       Problem List Patient Active Problem List   Diagnosis Date Noted  . Spinal stenosis of lumbar region 09/20/2015  . 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 Moderate persistent asthma 07/03/2015  . Allergic rhinitis 07/03/2015  . Colitis due to Clostridium difficile 02/10/2015  . Abdominal pain 02/10/2015  . Nausea and vomiting 02/10/2015  . Acute renal failure (HCC) 02/10/2015  . Dehydration 02/10/2015  . Hypoxia 02/10/2015  . Hyponatremia 02/10/2015  . Hypokalemia 02/10/2015  . COPD (chronic obstructive pulmonary disease) (HCC)   . Unspecified constipation 07/15/2013  . DYSPNEA 04/05/2008  . Essential hypertension 03/10/2008  . HEADACHE 03/10/2008  . COUGH 03/10/2008    Virgina Organ, PT CLT  [phone] 10/24/2015, 4:31 PM  [facility] [address] [place], [place], [address] Phone: [phone]   Fax:  [phone]  Name: Betty Golden MRN: [id number] Date of Birth: January 23, 1946

## 2015-10-24 NOTE — Patient Instructions (Signed)
Scapular Retraction (Standing)    With arms at sides, pinch shoulder blades together. Repeat _10___ times per set. Do __1__ sets per session. Do _2___ sessions per day.  http://orth.exer.us/944   Copyright  VHI. All rights reserved.  Flexibility: Neck Retraction    Pull head straight back, keeping eyes and jaw level. Repeat _10__ times per set. Do __1__ sets per session. Do __2__ sessions per day.  http://orth.exer.us/344   Copyright  VHI. All rights reserved.  Isometric Abdominal    Lying on back with knees bent, tighten stomach by pressing elbows down. Hold ___3_ seconds. Repeat _10___ times per set. Do 1____ sets per session. Do __4__ sessions per day. 1 http://orth.exer.us/1086   Copyright  VHI. All rights reserved.  Bridging    Slowly raise buttocks from floor, keeping stomach tight. Repeat __10__ times per set. Do __1__ sets per session. Do __2__ sessions per day.  http://orth.exer.us/1096   Copyright  VHI. All rights reserved.  Lifting Principles  .Maintain proper posture and head alignment. .Slide object as close as possible before lifting. .Move obstacles out of the way. .Test before lifting; ask for help if too heavy. .Tighten stomach muscles without holding breath. .Use smooth movements; do not jerk. .Use legs to do the work, and pivot with feet. .Distribute the work load symmetrically and close to the center of trunk. .Push instead of pull whenever possible.  Copyright  VHI. All rights reserved.  Deep Squat    Squat and lift with both arms held against upper trunk. Tighten stomach muscles without holding breath. Use smooth movements to avoid jerking.  Copyright  VHI. All rights reserved.  Getting Into / Out of Bed    Lower self to lie down on one side by raising legs and lowering head at the same time. Use arms to assist moving without twisting. Bend both knees to roll onto back if desired. To sit up, start from lying on side, and use  same move-ments in reverse. Keep trunk aligned with legs.   Copyright  VHI. All rights reserved.

## 2015-10-25 ENCOUNTER — Encounter (HOSPITAL_COMMUNITY): Payer: Medicare Other | Admitting: Physical Therapy

## 2015-10-31 ENCOUNTER — Ambulatory Visit (HOSPITAL_COMMUNITY): Payer: Medicare Other | Attending: Family Medicine

## 2015-10-31 DIAGNOSIS — M6281 Muscle weakness (generalized): Secondary | ICD-10-CM | POA: Insufficient documentation

## 2015-10-31 DIAGNOSIS — M545 Low back pain, unspecified: Secondary | ICD-10-CM

## 2015-10-31 DIAGNOSIS — R262 Difficulty in walking, not elsewhere classified: Secondary | ICD-10-CM | POA: Insufficient documentation

## 2015-10-31 DIAGNOSIS — R29898 Other symptoms and signs involving the musculoskeletal system: Secondary | ICD-10-CM | POA: Diagnosis present

## 2015-10-31 NOTE — Therapy (Signed)
[facility] [address] [place], [place], [address] Phone: [phone]   Fax:  [phone]  Physical Therapy Treatment  Patient Details  Name: Betty Golden MRN: [id number] Date of Birth: 02-03-1946 Referring Provider: Leandra Kern  Encounter Date: 10/31/2015      PT End of Session - 10/31/15 0939    Visit Number 2   Number of Visits 8   Date for PT Re-Evaluation 11/23/15   Authorization Type [employer] medicare   Authorization - Visit Number 2   Authorization - Number of Visits 8   PT Start Time 0920   PT Stop Time 1008   PT Time Calculation (min) 48 min   Activity Tolerance Patient tolerated treatment well   Behavior During Therapy [facility] for tasks assessed/performed      Past Medical History  Diagnosis Date  . Hypertension   . High cholesterol   . Chronic headaches   . Asthma   . Shortness of breath     SOB with exertion  . Colitis     01/2015  . Diverticulosis   . Lung nodule     per CT stable/benign  . Hypoxia   . C. difficile colitis   . HNP (herniated nucleus pulposus), lumbar   . Arthritis   . GERD (gastroesophageal reflux disease)   . Depression     Past Surgical History  Procedure Laterality Date  . Appendectomy    . Cholecystectomy    . Breast reduction surgery    . Tonsillectomy    . Colonoscopy N/A 08/19/2013    Procedure: COLONOSCOPY;  Surgeon: Malissa Hippo, MD;  [facility];  Service: Endoscopy;  Laterality: N/A;  155-moved to 140 Ann to notify pt  . Lumbar laminectomy/decompression microdiscectomy Bilateral 09/20/2015    Procedure: MICRO LUMBAR DECOMPRESSION L5-S1 BILATERAL    (1 LEVEL);  Surgeon: [name], MD;  Location: [facility];  Service: Orthopedics;  Laterality: Bilateral;    There were no vitals filed for this visit.  Visit Diagnosis:  Difficulty walking  Bilateral leg weakness  Midline low back pain without sciatica      Subjective Assessment - 10/31/15 0913     Subjective Pt stated she had rough weekend, increased pain center of lower back around incision site.  Reports she has done the HEP one time and has no questions concering.                           [facility] Adult PT Treatment/Exercise - 10/31/15 0001    Bed Mobility   Bed Mobility Sit to Sidelying Left   Sit to Sidelying Left 5: Supervision   Sit to Sidelying Left Details (indicate cue type and reason) Educated proper bed mobility for sit to supine, VC required only   Posture/Postural Control   Posture/Postural Control Postural limitations   Postural Limitations Rounded Shoulders;Forward head;Decreased lumbar lordosis;Increased thoracic kyphosis   Lumbar Exercises: Stretches   Active Hamstring Stretch 3 reps;30 seconds   Active Hamstring Stretch Limitations supine with rope   Quad Stretch 2 reps;30 seconds   Quad Stretch Limitations standing 2nd step hip flexor stretch   Lumbar Exercises: Seated   Other Seated Lumbar Exercises scapular and cervical retracton x 10    Other Seated Lumbar Exercises Educated on proper posture landmarks   Lumbar Exercises: Supine   Ab Set 10 reps;5 seconds   AB Set Limitations TrA verbal and tactile cueing for proper activation  Bent Knee Raise 10 reps;5 seconds   Bent Knee Raise Limitations cueing for stabilty   Bridge 10 reps   Straight Leg Raise 10 reps   Lumbar Exercises: Sidelying   Hip Abduction 10 reps   Hip Abduction Limitations tactile cueing for proper form   Lumbar Exercises: Prone   Other Prone Lumbar Exercises heel squeeze 5x5"                PT Education - 10/31/15 0938    Education provided Yes   Education Details Reviewed goals, compliance and technique with HEP, copy of eval given; educated on importance of proper posture   Methods Explanation;Demonstration;Handout   Comprehension Verbalized understanding;Returned demonstration;Verbal cues required;Need further instruction          PT Short Term Goals -  10/24/15 1613    PT SHORT TERM GOAL #1   Title Pt to be able to demonstrate proper body mechanics for changing body position in order to prevent future back injury    Time 2   Period Weeks   Status New   PT SHORT TERM GOAL #2   Title Pt to demonsrate good sitting posture and verbalize the importance of good posture in back care.    Time 2   Period Weeks   Status New   PT SHORT TERM GOAL #3   Title Pt core strength to improve to allow pt to be able to complete bed mobility witnout difficulty or increased pain    Time 2   Period Weeks   Status New   PT SHORT TERM GOAL #4   Title Pt to state that she is walking 15 minutes a day for improved back care and health    Time 2   Period Weeks   Status New           PT Long Term Goals - 10/24/15 1618    PT LONG TERM GOAL #1   Title Pt lower extremity strength to be increased one grade bilaterally to allow pt to get in and out of low cars without difficulty   Time 4   Period Weeks   Status New   PT LONG TERM GOAL #2   Title Pt pain to be no greater than a 1/10 with standing/ walking activittes for over 2 hours to allow pt to shop without discomfort    Time 4   Period Weeks   Status New   PT LONG TERM GOAL #3   Title Pt to state that she is walking at least 30 mintues five times a week for improved self back care and healty habits.    Time 4   Period Weeks   Status New   PT LONG TERM GOAL #4   Title Pt core strength to improve so that patient can vocalized that she is able to carry groceries into the house with confidence and no increased pain.    Time 4   Period Weeks               Plan - 10/31/15 9024    Clinical Impression Statement Reviewed goals, compliance and technique with HEP and copy of evaluation given to pt.  Pt educated on importance of proper body mechanics with sit to supine as well as importance of proper posture body landmarks to reduce stress on back musculature.   Began core and proximal musculature  strengthening to improve lumbar stability with minimal verbal and tactile cueing required.  End of session pt reports back  pain reduced.to 2/10.   Pt will benefit from skilled therapeutic intervention in order to improve on the following deficits Decreased activity tolerance;Decreased range of motion;Decreased strength;Pain;Postural dysfunction;Improper body mechanics   Rehab Potential Good   PT Frequency 2x / week   PT Duration 4 weeks   PT Treatment/Interventions ADLs/Self Care Home Management;Gait training;Stair training;Functional mobility training;Therapeutic activities;Therapeutic exercise;Balance training;Neuromuscular re-education;Patient/family education;Scar mobilization;Manual techniques   PT Next Visit Plan Continue with current PT POC to improve core stability, proximal musculature strengthening and hamstring/hip flexor stretches.  Progress to closed chain kinetic strengthening and proper lifting        Problem List Patient Active Problem List   Diagnosis Date Noted  . Spinal stenosis of lumbar region 09/20/2015  . 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 Moderate persistent asthma 07/03/2015  . Allergic rhinitis 07/03/2015  . Colitis due to Clostridium difficile 02/10/2015  . Abdominal pain 02/10/2015  . Nausea and vomiting 02/10/2015  . Acute renal failure (HCC) 02/10/2015  . Dehydration 02/10/2015  . Hypoxia 02/10/2015  . Hyponatremia 02/10/2015  . Hypokalemia 02/10/2015  . COPD (chronic obstructive pulmonary disease) (HCC)   . Unspecified constipation 07/15/2013  . DYSPNEA 04/05/2008  . Essential hypertension 03/10/2008  . HEADACHE 03/10/2008  . COUGH 03/10/2008   Becky Sax, LPTA; CBIS [phone]  [name] 10/31/2015, 10:11 AM  [facility] [address] [place], [place], [address] Phone: [phone]   Fax:  [phone]  Name: Betty Golden MRN: [id number] Date of Birth: 12-14-1945

## 2015-11-02 ENCOUNTER — Ambulatory Visit (HOSPITAL_COMMUNITY): Payer: Medicare Other

## 2015-11-02 NOTE — Therapy (Signed)
[facility] [address] [place], [place], [address] Phone: [phone]   Fax:  [phone]  Patient Details  Name: Betty Golden MRN: [id number] Date of Birth: 10-20-1945 Referring Provider:  Zachery Dauer, MD  Encounter Date: 11/02/2015   Pt. arrived for tx today.  Secretary on phone with insurance pertaining to number of visits approved, awaiting call back.  No tx complete today.  [address] LPTA; CBIS [phone]  [name] 11/02/2015, 9:57 AM  [place] [facility] [address] [place], [place], [address] Phone: [phone]   Fax:  [phone]

## 2015-11-07 ENCOUNTER — Ambulatory Visit (HOSPITAL_COMMUNITY): Payer: Medicare Other | Admitting: Physical Therapy

## 2015-11-07 ENCOUNTER — Ambulatory Visit: Payer: Medicare Other | Admitting: Allergy and Immunology

## 2015-11-07 DIAGNOSIS — R262 Difficulty in walking, not elsewhere classified: Secondary | ICD-10-CM

## 2015-11-07 DIAGNOSIS — M545 Low back pain, unspecified: Secondary | ICD-10-CM

## 2015-11-07 DIAGNOSIS — M6281 Muscle weakness (generalized): Secondary | ICD-10-CM

## 2015-11-07 NOTE — Addendum Note (Signed)
Addended by: Bella Kennedy on: 11/07/2015 10:39 AM   Modules accepted: Orders

## 2015-11-07 NOTE — Patient Instructions (Signed)
Heel Squeeze (Prone)    Abdomen supported, bend knees and gently squeeze heels together. Hold __5__ seconds. Repeat _10___ times per set. Do _1___ sets per session. Do _2___ sessions per day.  http://orth.exer.us/1080   Copyright  VHI. All rights reserved.  Straight Leg Raise (Prone)    Abdomen and head supported, keep left knee locked and raise leg at hip. Avoid arching low back. Repeat _10___ times per set. Do _1___ sets per session. Do 2____ sessions per day.  http://orth.exer.us/1112   Copyright  VHI. All rights reserved.  Scapular Retraction (Prone)    Lie with arms at sides. Pinch shoulder blades together and raise arms a few inches from floor. Repeat _10___ times per set. Do ___1_ sets per session. Do ___2_ sessions per day.  http://orth.exer.us/954   Copyright  VHI. All rights reserved.  Scapular Retraction: Abduction (Prone)    Lie with upper arms straight out from sides, elbows bent to 90. Pinch shoulder blades together and raise arms a few inches from floor. Repeat _10___ times per set. Do __1__ sets per session. Do __2__ sessions per day.  http://orth.exer.us/956   Copyright  VHI. All rights reserved.  On Elbows (Prone)    Rise up on elbows as high as possible, keeping hips on floor. Hold _5___ seconds. Repeat __10__ times per set. Do __1__ sets per session. Do __2__ sessions per day.  http://orth.exer.us/92   Copyright  VHI. All rights reserved.  Press-Up    Press upper body upward, keeping hips in contact with floor. Keep lower back and buttocks relaxed. Hold __3__ seconds. Repeat __10__ times per set. Do __1__ sets per session. Do ___2_ sessions per day.  http://orth.exer.us/94   Copyright  VHI. All rights reserved.

## 2015-11-07 NOTE — Therapy (Signed)
[facility] [address] [place], [place], [address] Phone: [phone]   Fax:  [phone]  Physical Therapy Treatment  Patient Details  Name: Betty Golden MRN: [id number] Date of Birth: 1945-10-02 Referring Provider: Leandra Kern  Encounter Date: 11/07/2015      PT End of Session - 11/07/15 1010    Authorization - Visit Number 3   Authorization - Number of Visits 8   PT Start Time 0951   PT Stop Time 1033   PT Time Calculation (min) 42 min   Activity Tolerance Patient tolerated treatment well   Behavior During Therapy [facility] for tasks assessed/performed      Past Medical History  Diagnosis Date  . Hypertension   . High cholesterol   . Chronic headaches   . Asthma   . Shortness of breath     SOB with exertion  . Colitis     01/2015  . Diverticulosis   . Lung nodule     per CT stable/benign  . Hypoxia   . C. difficile colitis   . HNP (herniated nucleus pulposus), lumbar   . Arthritis   . GERD (gastroesophageal reflux disease)   . Depression     Past Surgical History  Procedure Laterality Date  . Appendectomy    . Cholecystectomy    . Breast reduction surgery    . Tonsillectomy    . Colonoscopy N/A 08/19/2013    Procedure: COLONOSCOPY;  Surgeon: Malissa Hippo, MD;  [facility];  Service: Endoscopy;  Laterality: N/A;  155-moved to 140 Ann to notify pt  . Lumbar laminectomy/decompression microdiscectomy Bilateral 09/20/2015    Procedure: MICRO LUMBAR DECOMPRESSION L5-S1 BILATERAL    (1 LEVEL);  Surgeon: [name], MD;  Location: [facility];  Service: Orthopedics;  Laterality: Bilateral;    There were no vitals filed for this visit.      Subjective Assessment - 11/07/15 0954    Subjective Pt states that she is doing her home exercise program.  Overall pt states that she is doing 65% better.             [facility] PT Assessment - 11/07/15 0001    Assessment   Medical Diagnosis s/p lumbar  microdisectomy   Onset Date/Surgical Date 09/20/15   Next MD Visit 11/22/2015   Prior Therapy none    Precautions   Precautions Back   Precaution Comments surgery   Restrictions   Weight Bearing Restrictions No   Home Environment   Living Environment Private residence   Type of Home House   Home Access Level entry   Home Layout Able to live on main level with bedroom/bathroom   Prior Function   Level of Independence Independent   Vocation Retired   Leisure walking , working Arboriculturist   Overall Cognitive Status Within Functional Limits for tasks assessed   Observation/Other Assessments   Focus on Therapeutic Outcomes (FOTO)  --   Posture/Postural Control   Posture/Postural Control Postural limitations   Postural Limitations Rounded Shoulders;Forward head;Decreased lumbar lordosis;Increased thoracic kyphosis   AROM   Lumbar Extension 20  was 12    Lumbar - Right Side Bend 20  was 12   Lumbar - Left Side Bend 10  was 5    Strength   Right Hip Flexion 4+/5  was 4/5    Right Hip Extension 3/5  was 3-/5    Right Hip ABduction 5/5  was 3+/5    Left  Hip Flexion 5/5   Left Hip Extension 3/5  was 3/5    Left Hip ABduction 5/5   Right Knee Flexion 4/5  was 3+/5    Right Knee Extension 5/5  was 4+/5    Left Knee Flexion 4+/5   Left Knee Extension 5/5   Right Ankle Dorsiflexion 5/5  was 3/5    Left Ankle Dorsiflexion 4/5  was 3/5                      [facility] Adult PT Treatment/Exercise - 11/07/15 0001    Lumbar Exercises: Stretches   Active Hamstring Stretch 1 rep;60 seconds   Active Hamstring Stretch Limitations supine    Single Knee to Chest Stretch 1 rep;60 seconds   Prone on Elbows Stretch 5 reps   Press Ups 5 reps   Lumbar Exercises: Standing   Heel Raises 10 reps   Functional Squats 10 reps   Lumbar Exercises: Prone   Straight Leg Raise 10 reps   Other Prone Lumbar Exercises glut set, heelsqueeze x 10; shoulder extension, w back x 10                  PT Education - 11/07/15 1006    Education provided Yes   Education Details new prone exercises    Person(s) Educated Patient   Methods Explanation   Comprehension Verbalized understanding          PT Short Term Goals - 11/07/15 1023    PT SHORT TERM GOAL #1   Title Pt to be able to demonstrate proper body mechanics for changing body position in order to prevent future back injury    Period Weeks   PT SHORT TERM GOAL #2   Title Pt to demonsrate good sitting posture and verbalize the importance of good posture in back care.    Time 2   Period Weeks   Status On-going   PT SHORT TERM GOAL #3   Title Pt core strength to improve to allow pt to be able to complete bed mobility witnout difficulty or increased pain    Time 2   Period Weeks   Status On-going   PT SHORT TERM GOAL #4   Title Pt to state that she is walking 15 minutes a day for improved back care and health    Time 2   Period Weeks   Status On-going           PT Long Term Goals - 11/07/15 1023    PT LONG TERM GOAL #1   Title Pt lower extremity strength to be increased one grade bilaterally to allow pt to get in and out of low cars without difficulty   Time 4   Period Weeks   Status On-going   PT LONG TERM GOAL #2   Title Pt pain to be no greater than a 1/10 with standing/ walking activittes for over 2 hours to allow pt to shop without discomfort    Time 4   Period Weeks   Status On-going   PT LONG TERM GOAL #3   Title Pt to state that she is walking at least 30 mintues five times a week for improved self back care and healty habits.    Time 4   Period Weeks   Status On-going   PT LONG TERM GOAL #4   Title Pt core strength to improve so that patient can vocalized that she is able to carry groceries into the house  with confidence and no increased pain.    Time 4   Period Weeks   Status On-going               Plan - 11/07/15 1010    Clinical Impression Statement Added prone  strengtheing and stretching exercises with verbal and tactile cuing for proper technique.  Pt given these exercises as a HEP.  Progressed to closed chain exercises as well.  Pt to MD tomorrow reassessed with good improvement in all aspects but patient continues to need hip strengthening, improved flexibility and improved knowledge on body mechanics.    Rehab Potential Good   PT Frequency 2x / week   PT Duration 4 weeks   PT Treatment/Interventions ADLs/Self Care Home Management;Gait training;Stair training;Functional mobility training;Therapeutic activities;Therapeutic exercise;Balance training;Neuromuscular re-education;Patient/family education;Scar mobilization;Manual techniques   PT Next Visit Plan Continue with stabilization in standing as well as progressing to lifting techinques.  Insurance approved only 8 viisits.       Patient will benefit from skilled therapeutic intervention in order to improve the following deficits and impairments:  Decreased activity tolerance, Decreased range of motion, Decreased strength, Pain, Postural dysfunction, Improper body mechanics  Visit Diagnosis: Difficulty in walking, not elsewhere classified  Muscle weakness (generalized)  Midline low back pain without sciatica     Problem List Patient Active Problem List   Diagnosis Date Noted  . Spinal stenosis of lumbar region 09/20/2015  . 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 Moderate persistent asthma 07/03/2015  . Allergic rhinitis 07/03/2015  . Colitis due to Clostridium difficile 02/10/2015  . Abdominal pain 02/10/2015  . Nausea and vomiting 02/10/2015  . Acute renal failure (HCC) 02/10/2015  . Dehydration 02/10/2015  . Hypoxia 02/10/2015  . Hyponatremia 02/10/2015  . Hypokalemia 02/10/2015  . COPD (chronic obstructive pulmonary disease) (HCC)   . Unspecified constipation 07/15/2013  . DYSPNEA 04/05/2008  . Essential hypertension 03/10/2008  . HEADACHE 03/10/2008  . COUGH 03/10/2008   Virgina Organ, PT CLT [phone] 11/07/2015, 10:32 AM  [facility] [address] [place], [place], [address] Phone: [phone]   Fax:  [phone]  Name: Betty Golden MRN: [id number] Date of Birth: Jan 04, 1946

## 2015-11-09 ENCOUNTER — Ambulatory Visit (HOSPITAL_COMMUNITY): Payer: Medicare Other

## 2015-11-09 DIAGNOSIS — M6281 Muscle weakness (generalized): Secondary | ICD-10-CM

## 2015-11-09 DIAGNOSIS — M545 Low back pain, unspecified: Secondary | ICD-10-CM

## 2015-11-09 DIAGNOSIS — R262 Difficulty in walking, not elsewhere classified: Secondary | ICD-10-CM

## 2015-11-09 NOTE — Therapy (Signed)
[facility] [address] [place], [place], [address] Phone: [phone]   Fax:  [phone]  Physical Therapy Treatment  Patient Details  Name: Betty Golden MRN: [id number] Date of Birth: [birth year] Referring Provider: Leandra Kern  Encounter Date: 11/09/2015      PT End of Session - 11/09/15 0948    Visit Number 4   Number of Visits 8   Date for PT Re-Evaluation 11/23/15   Authorization Type [employer] medicare   Authorization - Visit Number 4   Authorization - Number of Visits 8   PT Start Time 0945   PT Stop Time 1030   PT Time Calculation (min) 45 min   Activity Tolerance Patient tolerated treatment well;No increased pain   Behavior During Therapy [facility] for tasks assessed/performed      Past Medical History  Diagnosis Date  . Hypertension   . High cholesterol   . Chronic headaches   . Asthma   . Shortness of breath     SOB with exertion  . Colitis     01/2015  . Diverticulosis   . Lung nodule     per CT stable/benign  . Hypoxia   . C. difficile colitis   . HNP (herniated nucleus pulposus), lumbar   . Arthritis   . GERD (gastroesophageal reflux disease)   . Depression     Past Surgical History  Procedure Laterality Date  . Appendectomy    . Cholecystectomy    . Breast reduction surgery    . Tonsillectomy    . Colonoscopy N/A 08/19/2013    Procedure: COLONOSCOPY;  Surgeon: Malissa Hippo, MD;  [facility];  Service: Endoscopy;  Laterality: N/A;  155-moved to 140 Ann to notify pt  . Lumbar laminectomy/decompression microdiscectomy Bilateral 09/20/2015    Procedure: MICRO LUMBAR DECOMPRESSION L5-S1 BILATERAL    (1 LEVEL);  Surgeon: [name], MD;  Location: [facility];  Service: Orthopedics;  Laterality: Bilateral;    There were no vitals filed for this visit.      Subjective Assessment - 11/09/15 0945    Subjective Pt stated increased back pain from sitting for long periods of time, pain scale  6/10.  MD happy with progress   Pertinent History HTN,    Patient Stated Goals To learn proper body mechanics so she doesn't reinjure her back; learn exercises for her back    Currently in Pain? Yes   Pain Score 6    Pain Location Back   Pain Orientation Lower   Pain Descriptors / Indicators Aching   Pain Type Surgical pain   Pain Onset More than a month ago   Pain Frequency Intermittent   Aggravating Factors  sitting and standing for long periods of time   Pain Relieving Factors posture exercises   Effect of Pain on Daily Activities intermittent pain, has to change positions or stop activity            [facility] PT Assessment - 11/09/15 0001    Assessment   Medical Diagnosis s/p lumbar microdisectomy   Referring Provider Leandra Kern   Onset Date/Surgical Date 09/20/15   Next MD Visit 12/13/2015   Prior Therapy none    Precautions   Precautions Back   Precaution Comments surgery                     [facility] Adult PT Treatment/Exercise - 11/09/15 0001    Posture/Postural Control   Posture/Postural Control Postural limitations  Postural Limitations Rounded Shoulders;Forward head;Decreased lumbar lordosis;Increased thoracic kyphosis   Lumbar Exercises: Stretches   Active Hamstring Stretch 3 reps;30 seconds   Active Hamstring Stretch Limitations supine    Standing Extension 5 reps   Prone on Elbows Stretch 5 reps;10 seconds   Lumbar Exercises: Standing   Heel Raises 15 reps   Heel Raises Limitations Toe raises 15x   Functional Squats 10 reps   Functional Squats Limitations cueing for form   Forward Lunge 10 reps   Forward Lunge Limitations 6in step   Scapular Retraction 10 reps;Theraband   Theraband Level (Scapular Retraction) Level 2 (Red)   Row 10 reps;Theraband   Theraband Level (Row) Level 2 (Red)   Shoulder Extension 10 reps   Theraband Level (Shoulder Extension) Level 2 (Red)   Other Standing Lumbar Exercises SLS 1x 30" with 1 UE A; Lt 4", Rt 3" max of  5   Other Standing Lumbar Exercises weight shifting and squats no transverse plane   Lumbar Exercises: Prone   Other Prone Lumbar Exercises heel squeeze 10x 5"   Other Prone Lumbar Exercises shoulder extension, wback and rows 10                  PT Short Term Goals - 11/07/15 1023    PT SHORT TERM GOAL #1   Title Pt to be able to demonstrate proper body mechanics for changing body position in order to prevent future back injury    Period Weeks   PT SHORT TERM GOAL #2   Title Pt to demonsrate good sitting posture and verbalize the importance of good posture in back care.    Time 2   Period Weeks   Status On-going   PT SHORT TERM GOAL #3   Title Pt core strength to improve to allow pt to be able to complete bed mobility witnout difficulty or increased pain    Time 2   Period Weeks   Status On-going   PT SHORT TERM GOAL #4   Title Pt to state that she is walking 15 minutes a day for improved back care and health    Time 2   Period Weeks   Status On-going           PT Long Term Goals - 11/07/15 1023    PT LONG TERM GOAL #1   Title Pt lower extremity strength to be increased one grade bilaterally to allow pt to get in and out of low cars without difficulty   Time 4   Period Weeks   Status On-going   PT LONG TERM GOAL #2   Title Pt pain to be no greater than a 1/10 with standing/ walking activittes for over 2 hours to allow pt to shop without discomfort    Time 4   Period Weeks   Status On-going   PT LONG TERM GOAL #3   Title Pt to state that she is walking at least 30 mintues five times a week for improved self back care and healty habits.    Time 4   Period Weeks   Status On-going   PT LONG TERM GOAL #4   Title Pt core strength to improve so that patient can vocalized that she is able to carry groceries into the house with confidence and no increased pain.    Time 4   Period Weeks   Status On-going               Plan - 11/09/15 1246  Clinical  Impression Statement Progressed standing exercises for functional strengthening and added postural strengthening exercises.  Pt required verbal and tactile cueing for proper form and technqiue through session.  Pt reports increased Lt side lower back pain with SLS activities, pain resolved following activity.  No reports of increased pain at end of session.   Rehab Potential Good   PT Frequency 2x / week   PT Duration 4 weeks   PT Treatment/Interventions ADLs/Self Care Home Management;Gait training;Stair training;Functional mobility training;Therapeutic activities;Therapeutic exercise;Balance training;Neuromuscular re-education;Patient/family education;Scar mobilization;Manual techniques   PT Next Visit Plan Continue with stabilization in standing as well as progressing to lifting techinques.  Insurance approved only 8 viisits.       Patient will benefit from skilled therapeutic intervention in order to improve the following deficits and impairments:     Visit Diagnosis: Difficulty in walking, not elsewhere classified  Muscle weakness (generalized)  Midline low back pain without sciatica     Problem List Patient Active Problem List   Diagnosis Date Noted  . Spinal stenosis of lumbar region 09/20/2015  . 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 Moderate persistent asthma 07/03/2015  . Allergic rhinitis 07/03/2015  . Colitis due to Clostridium difficile 02/10/2015  . Abdominal pain 02/10/2015  . Nausea and vomiting 02/10/2015  . Acute renal failure (HCC) 02/10/2015  . Dehydration 02/10/2015  . Hypoxia 02/10/2015  . Hyponatremia 02/10/2015  . Hypokalemia 02/10/2015  . COPD (chronic obstructive pulmonary disease) (HCC)   . Unspecified constipation 07/15/2013  . DYSPNEA 04/05/2008  . Essential hypertension 03/10/2008  . HEADACHE 03/10/2008  . COUGH 03/10/2008   Becky Sax, LPTA; CBIS [phone]  [name] 11/09/2015, 12:51 PM  [facility] [address] [place], [place], [address] Phone: [phone]   Fax:  [phone]  Name: Betty Golden MRN: [id number] Date of Birth: 01/13/1946

## 2015-11-14 ENCOUNTER — Ambulatory Visit (HOSPITAL_COMMUNITY): Payer: Medicare Other | Admitting: Physical Therapy

## 2015-11-14 DIAGNOSIS — M545 Low back pain, unspecified: Secondary | ICD-10-CM

## 2015-11-14 DIAGNOSIS — R262 Difficulty in walking, not elsewhere classified: Secondary | ICD-10-CM | POA: Diagnosis not present

## 2015-11-14 DIAGNOSIS — M6281 Muscle weakness (generalized): Secondary | ICD-10-CM

## 2015-11-14 NOTE — Therapy (Signed)
[facility] [address] [place], [place], [address] Phone: [phone]   Fax:  [phone]  Physical Therapy Treatment  Patient Details  Name: Betty Golden MRN: [id number] Date of Birth: 1945/09/29 Referring Provider: Leandra Kern  Encounter Date: 11/14/2015      PT End of Session - 11/14/15 1215    Visit Number 5   Number of Visits 8   Date for PT Re-Evaluation 11/23/15   Authorization Type BCBS medicare   Authorization - Visit Number 5   Authorization - Number of Visits 8   PT Start Time [phone]   PT Stop Time 1035   PT Time Calculation (min) 43 min   Activity Tolerance Patient tolerated treatment well   Behavior During Therapy [facility] for tasks assessed/performed      Past Medical History  Diagnosis Date  . Hypertension   . High cholesterol   . Chronic headaches   . Asthma   . Shortness of breath     SOB with exertion  . Colitis     01/2015  . Diverticulosis   . Lung nodule     per CT stable/benign  . Hypoxia   . C. difficile colitis   . HNP (herniated nucleus pulposus), lumbar   . Arthritis   . GERD (gastroesophageal reflux disease)   . Depression     Past Surgical History  Procedure Laterality Date  . Appendectomy    . Cholecystectomy    . Breast reduction surgery    . Tonsillectomy    . Colonoscopy N/A 08/19/2013    Procedure: COLONOSCOPY;  Surgeon: Malissa Hippo, MD;  [facility];  Service: Endoscopy;  Laterality: N/A;  155-moved to 140 Ann to notify pt  . Lumbar laminectomy/decompression microdiscectomy Bilateral 09/20/2015    Procedure: MICRO LUMBAR DECOMPRESSION L5-S1 BILATERAL    (1 LEVEL);  Surgeon: [name], MD;  [facility];  Service: Orthopedics;  Laterality: Bilateral;    There were no vitals filed for this visit.      Subjective Assessment - 11/14/15 0956    Subjective Pt states that sitting is still difficult for her.  Walking is easier now     Pertinent History HTN,     Currently in Pain? Yes   Pain Score 4    Pain Location Back   Pain Orientation Lower   Pain Descriptors / Indicators Aching   Pain Type Surgical pain   Pain Radiating Towards none    Pain Onset More than a month ago   Pain Frequency Intermittent   Aggravating Factors  sitting    Pain Relieving Factors exercises                [facility] Adult PT Treatment/Exercise - 11/14/15 0001    Lumbar Exercises: Standing   Heel Raises 5 reps   Functional Squats 10 reps   Forward Lunge 10 reps   Wall Slides 10 reps   Scapular Retraction 10 reps;Theraband   Theraband Level (Scapular Retraction) Level 2 (Red)   Row 10 reps;Theraband   Theraband Level (Row) Level 2 (Red)   Shoulder Extension 10 reps   Theraband Level (Shoulder Extension) Level 2 (Red)   Other Standing Lumbar Exercises 3 D hip excursion x 3; cervical and scapular retraction x 10    Other Standing Lumbar Exercises lifting x 5               PT Short Term Goals - 11/07/15 1023  PT SHORT TERM GOAL #1   Title Pt to be able to demonstrate proper body mechanics for changing body position in order to prevent future back injury    Period Weeks   PT SHORT TERM GOAL #2   Title Pt to demonsrate good sitting posture and verbalize the importance of good posture in back care.    Time 2   Period Weeks   Status On-going   PT SHORT TERM GOAL #3   Title Pt core strength to improve to allow pt to be able to complete bed mobility witnout difficulty or increased pain    Time 2   Period Weeks   Status On-going   PT SHORT TERM GOAL #4   Title Pt to state that she is walking 15 minutes a day for improved back care and health    Time 2   Period Weeks   Status On-going           PT Long Term Goals - 11/07/15 1023    PT LONG TERM GOAL #1   Title Pt lower extremity strength to be increased one grade bilaterally to allow pt to get in and out of low cars without difficulty   Time 4   Period Weeks   Status On-going   PT LONG  TERM GOAL #2   Title Pt pain to be no greater than a 1/10 with standing/ walking activittes for over 2 hours to allow pt to shop without discomfort    Time 4   Period Weeks   Status On-going   PT LONG TERM GOAL #3   Title Pt to state that she is walking at least 30 mintues five times a week for improved self back care and healty habits.    Time 4   Period Weeks   Status On-going   PT LONG TERM GOAL #4   Title Pt core strength to improve so that patient can vocalized that she is able to carry groceries into the house with confidence and no increased pain.    Time 4   Period Weeks   Status On-going               Plan - 11/14/15 1216    Clinical Impression Statement Pt instruced and given t-band for postural exercises.  Pt instructed in using a towel roll when sitting to support low back.  Pt instructed in body mechanics for lifting 12" from floor to waist and waist to shoulder height.  Pt completed exercises with therapist facilitation for proper technique.     Rehab Potential Good   PT Frequency 2x / week   PT Duration 4 weeks   PT Treatment/Interventions ADLs/Self Care Home Management;Gait training;Stair training;Functional mobility training;Therapeutic activities;Therapeutic exercise;Balance training;Neuromuscular re-education;Patient/family education;Scar mobilization;Manual techniques   PT Next Visit Plan Begin tree pose for balance and core strengthening       Patient will benefit from skilled therapeutic intervention in order to improve the following deficits and impairments:  Decreased activity tolerance, Decreased range of motion, Decreased strength, Pain, Postural dysfunction, Improper body mechanics  Visit Diagnosis: Difficulty in walking, not elsewhere classified  Muscle weakness (generalized)  Midline low back pain without sciatica  Difficulty walking     Problem List Patient Active Problem List   Diagnosis Date Noted  . Spinal stenosis of lumbar region  09/20/2015  . 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 Moderate persistent asthma 07/03/2015  . Allergic rhinitis 07/03/2015  . Colitis due to Clostridium difficile 02/10/2015  . Abdominal  pain 02/10/2015  . Nausea and vomiting 02/10/2015  . Acute renal failure (HCC) 02/10/2015  . Dehydration 02/10/2015  . Hypoxia 02/10/2015  . Hyponatremia 02/10/2015  . Hypokalemia 02/10/2015  . COPD (chronic obstructive pulmonary disease) (HCC)   . Unspecified constipation 07/15/2013  . DYSPNEA 04/05/2008  . Essential hypertension 03/10/2008  . HEADACHE 03/10/2008  . COUGH 03/10/2008    Virgina Organ, PT CLT [phone] 11/14/2015, 12:20 PM  [facility] [address] [place], [place], [address] Phone: [phone]   Fax:  [phone]  Name: FAATIMA TENCH MRN: [id number] Date of Birth: 25-May-1946

## 2015-11-16 ENCOUNTER — Ambulatory Visit (HOSPITAL_COMMUNITY): Payer: Medicare Other

## 2015-11-16 DIAGNOSIS — R262 Difficulty in walking, not elsewhere classified: Secondary | ICD-10-CM | POA: Diagnosis not present

## 2015-11-16 DIAGNOSIS — M6281 Muscle weakness (generalized): Secondary | ICD-10-CM

## 2015-11-16 DIAGNOSIS — M545 Low back pain, unspecified: Secondary | ICD-10-CM

## 2015-11-16 NOTE — Therapy (Signed)
[place] [address] [place], [place], [address] Phone: [phone]   Fax:  [phone]  Physical Therapy Treatment  Patient Details  Name: Betty Golden MRN: [id number] Date of Birth: [birth year] Referring Provider: Leandra Kern  Encounter Date: 11/16/2015      PT End of Session - 11/16/15 0956    Visit Number 6   Number of Visits 8   Date for PT Re-Evaluation 11/23/15   Authorization Type [employer] medicare   Authorization - Visit Number 6   Authorization - Number of Visits 8   PT Start Time [phone]   PT Stop Time 1028   PT Time Calculation (min) 41 min   Activity Tolerance Patient tolerated treatment well   Behavior During Therapy [facility] for tasks assessed/performed      Past Medical History  Diagnosis Date  . Hypertension   . High cholesterol   . Chronic headaches   . Asthma   . Shortness of breath     SOB with exertion  . Colitis     01/2015  . Diverticulosis   . Lung nodule     per CT stable/benign  . Hypoxia   . C. difficile colitis   . HNP (herniated nucleus pulposus), lumbar   . Arthritis   . GERD (gastroesophageal reflux disease)   . Depression     Past Surgical History  Procedure Laterality Date  . Appendectomy    . Cholecystectomy    . Breast reduction surgery    . Tonsillectomy    . Colonoscopy N/A 08/19/2013    Procedure: COLONOSCOPY;  Surgeon: Malissa Hippo, MD;  [facility];  Service: Endoscopy;  Laterality: N/A;  155-moved to 140 Ann to notify pt  . Lumbar laminectomy/decompression microdiscectomy Bilateral 09/20/2015    Procedure: MICRO LUMBAR DECOMPRESSION L5-S1 BILATERAL    (1 LEVEL);  Surgeon: [name], MD;  Location: [facility];  Service: Orthopedics;  Laterality: Bilateral;    There were no vitals filed for this visit.      Subjective Assessment - 11/16/15 0949    Subjective Pt stated she has difficulty with sitting for longer than 15 minutes, increased difficulty standing  following.  Pain scale 5/10 achey in center of lower back   Pertinent History HTN,    Patient Stated Goals To learn proper body mechanics so she doesn't reinjure her back; learn exercises for her back    Currently in Pain? Yes   Pain Score 5    Pain Location Back   Pain Orientation Lower;Mid   Pain Descriptors / Indicators Aching   Pain Type Surgical pain   Pain Onset More than a month ago   Pain Frequency Intermittent   Aggravating Factors  sitting    Pain Relieving Factors exercises   Effect of Pain on Daily Activities intermittent pain, has to change positions or stop activity              [facility] Adult PT Treatment/Exercise - 11/16/15 0001    Posture/Postural Control   Posture/Postural Control Postural limitations   Postural Limitations Rounded Shoulders;Forward head;Decreased lumbar lordosis;Increased thoracic kyphosis   Balance Poses: Yoga   Tree Pose 2 reps  20" holds   Lumbar Exercises: Standing   Functional Squats 10 reps   Functional Squats Limitations cueing for form   Forward Lunge 10 reps   Wall Slides 10 reps   Scapular Retraction Limitations HEP   Row Limitations HEP   Shoulder Extension Limitations HEP  Other Standing Lumbar Exercises 3 D hip excursion x 3; cervical and scapular retraction x 10; SLS Rt 6", Lt 7", vector stance 3x 5" with UE A; modified tree pose 2x 20"   Other Standing Lumbar Exercises lifting x 5    Lumbar Exercises: Quadruped   Single Arm Raise 5 reps;5 seconds   Straight Leg Raise 5 reps;5 seconds                  PT Short Term Goals - 11/07/15 1023    PT SHORT TERM GOAL #1   Title Pt to be able to demonstrate proper body mechanics for changing body position in order to prevent future back injury    Period Weeks   PT SHORT TERM GOAL #2   Title Pt to demonsrate good sitting posture and verbalize the importance of good posture in back care.    Time 2   Period Weeks   Status On-going   PT SHORT TERM GOAL #3   Title Pt  core strength to improve to allow pt to be able to complete bed mobility witnout difficulty or increased pain    Time 2   Period Weeks   Status On-going   PT SHORT TERM GOAL #4   Title Pt to state that she is walking 15 minutes a day for improved back care and health    Time 2   Period Weeks   Status On-going           PT Long Term Goals - 11/07/15 1023    PT LONG TERM GOAL #1   Title Pt lower extremity strength to be increased one grade bilaterally to allow pt to get in and out of low cars without difficulty   Time 4   Period Weeks   Status On-going   PT LONG TERM GOAL #2   Title Pt pain to be no greater than a 1/10 with standing/ walking activittes for over 2 hours to allow pt to shop without discomfort    Time 4   Period Weeks   Status On-going   PT LONG TERM GOAL #3   Title Pt to state that she is walking at least 30 mintues five times a week for improved self back care and healty habits.    Time 4   Period Weeks   Status On-going   PT LONG TERM GOAL #4   Title Pt core strength to improve so that patient can vocalized that she is able to carry groceries into the house with confidence and no increased pain.    Time 4   Period Weeks   Status On-going               Plan - 11/16/15 1028    Clinical Impression Statement Session focus on improving core and proximal musculature functional strengthening.  Continued education with proper lifting mechanics as well as progressed core strenghtening with balance activities and quadruped activities for stability.  Therapist facilitation for proper form and technqiue with majority of exercises.  Pt reports pain resolved 0/10 at end of session.   Rehab Potential Good   PT Frequency 2x / week   PT Duration 4 weeks   PT Treatment/Interventions ADLs/Self Care Home Management;Gait training;Stair training;Functional mobility training;Therapeutic activities;Therapeutic exercise;Balance training;Neuromuscular  re-education;Patient/family education;Scar mobilization;Manual techniques   PT Next Visit Plan Continue with balance and core strengthening.      Patient will benefit from skilled therapeutic intervention in order to improve the following deficits and impairments:  Decreased activity tolerance, Decreased range of motion, Decreased strength, Pain, Postural dysfunction, Improper body mechanics  Visit Diagnosis: Difficulty in walking, not elsewhere classified  Muscle weakness (generalized)  Midline low back pain without sciatica     Problem List Patient Active Problem List   Diagnosis Date Noted  . Spinal stenosis of lumbar region 09/20/2015  . 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 Moderate persistent asthma 07/03/2015  . Allergic rhinitis 07/03/2015  . Colitis due to Clostridium difficile 02/10/2015  . Abdominal pain 02/10/2015  . Nausea and vomiting 02/10/2015  . Acute renal failure (HCC) 02/10/2015  . Dehydration 02/10/2015  . Hypoxia 02/10/2015  . Hyponatremia 02/10/2015  . Hypokalemia 02/10/2015  . COPD (chronic obstructive pulmonary disease) (HCC)   . Unspecified constipation 07/15/2013  . DYSPNEA 04/05/2008  . Essential hypertension 03/10/2008  . HEADACHE 03/10/2008  . COUGH 03/10/2008   Becky Sax, LPTA; CBIS [phone]  [name] 11/16/2015, 10:32 AM  [place] [facility] [address] [place], [place], [address] Phone: [phone]   Fax:  [phone]  Name: Betty Golden MRN: [id number] Date of Birth: 27-Oct-1945

## 2015-11-21 ENCOUNTER — Telehealth (HOSPITAL_COMMUNITY): Payer: Self-pay

## 2015-11-21 ENCOUNTER — Ambulatory Visit (HOSPITAL_COMMUNITY): Payer: Medicare Other | Admitting: Physical Therapy

## 2015-11-21 NOTE — Telephone Encounter (Signed)
11/21/15 cx - no reason given

## 2015-11-23 ENCOUNTER — Ambulatory Visit (HOSPITAL_COMMUNITY): Payer: Medicare Other | Admitting: Physical Therapy

## 2015-11-29 ENCOUNTER — Ambulatory Visit (HOSPITAL_COMMUNITY): Payer: Medicare Other | Attending: Specialist | Admitting: Physical Therapy

## 2015-11-29 DIAGNOSIS — M545 Low back pain, unspecified: Secondary | ICD-10-CM

## 2015-11-29 DIAGNOSIS — R262 Difficulty in walking, not elsewhere classified: Secondary | ICD-10-CM | POA: Diagnosis present

## 2015-11-29 DIAGNOSIS — M6281 Muscle weakness (generalized): Secondary | ICD-10-CM | POA: Diagnosis present

## 2015-11-29 NOTE — Therapy (Signed)
[place], [place], [address] Phone: [phone]   Fax:  [phone]  Physical Therapy Treatment (Re-Assessment)  Patient Details  Name: Betty Golden MRN: [id number] Date of Birth: Oct 28, 1945 Referring Provider: Susa Day   Encounter Date: 11/29/2015      PT End of Session - 11/29/15 1038    Visit Number 7   Number of Visits 9   Date for PT Re-Evaluation 12/27/15   Authorization Type BCBS medicare (G codes done 7th visit)   Authorization - Visit Number 7   Authorization - Number of Visits 8   PT Start Time 0903   PT Stop Time 0944   PT Time Calculation (min) 41 min   Activity Tolerance Patient tolerated treatment well   Behavior During Therapy [facility] for tasks assessed/performed      Past Medical History  Diagnosis Date  . Hypertension   . High cholesterol   . Chronic headaches   . Asthma   . Shortness of breath     SOB with exertion  . Colitis     01/2015  . Diverticulosis   . Lung nodule     per CT stable/benign  . Hypoxia   . C. difficile colitis   . HNP (herniated nucleus pulposus), lumbar   . Arthritis   . GERD (gastroesophageal reflux disease)   . Depression     Past Surgical History  Procedure Laterality Date  . Appendectomy    . Cholecystectomy    . Breast reduction surgery    . Tonsillectomy    . Colonoscopy N/A 08/19/2013    Procedure: COLONOSCOPY;  Surgeon: Rogene Houston, MD;  [facility];  Service: Endoscopy;  Laterality: N/A;  155-moved to 140 Ann to notify pt  . Lumbar laminectomy/decompression microdiscectomy Bilateral 09/20/2015    Procedure: MICRO LUMBAR DECOMPRESSION L5-S1 BILATERAL    (1 LEVEL);  Surgeon: Susa Day, MD;  Location: [facility];  Service: Orthopedics;  Laterality: Bilateral;    There were no vitals filed for this visit.      Subjective Assessment - 11/29/15 0904    Subjective Patient arrives reporting that her legs are bothering her  this week; she started walking more and is wondering if this is related to her starting to walk more. Patient reports that she is having an easier time bending forwards, her back has also been feeling better in general. She reports that although it is getting easier bending is still hard; she also still has to take her time doing what she needs to do. No falls or close calls recently.     Pertinent History HTN,    How long can you sit comfortably? 5/3- 60 minutes    How long can you stand comfortably? 5/3- 20 minutes    How long can you walk comfortably? 5/3- 15 minutes at a time    Patient Stated Goals To learn proper body mechanics so she doesn't reinjure her back; learn exercises for her back    Currently in Pain? Yes   Pain Score 3    Pain Location Other (Comment)  general ache and muscle soreness    Pain Orientation Other (Comment)  general body    Pain Descriptors / Indicators Aching;Sore   Pain Type Acute pain   Pain Radiating Towards none    Pain Onset More than a month ago   Pain Frequency Constant   Aggravating Factors  nothing    Pain Relieving Factors exercises  at therapy    Effect of Pain on Daily Activities doesn't like to go out of her home for too long, trouble with extended tasks             [facility] PT Assessment - 11/29/15 0001    Assessment   Medical Diagnosis s/p lumbar microdisectomy   Referring Provider Susa Day    Onset Date/Surgical Date 09/20/15   Next MD Visit 12/13/2015   Prior Therapy none    Precautions   Precautions Back   Precaution Comments surgery   Prior Function   Level of Independence Independent   Vocation Retired   Leisure walking , working ouit   Observation/Other Assessments   Focus on Therapeutic Outcomes (FOTO)  44% limited    AROM   Lumbar Extension 16   Lumbar - Right Side Bend --  hip flexoin/trunk rotation even with mod cues   Lumbar - Left Side Bend --  hip flexion/trunk rotation even with mod cues    Strength   Right  Hip Flexion 5/5   Right Hip Extension 2/5   Right Hip ABduction 4/5   Left Hip Flexion 5/5   Left Hip Extension 2/5   Left Hip ABduction 4+/5   Right Knee Flexion 4+/5   Right Knee Extension 4/5   Left Knee Flexion 4+/5   Left Knee Extension 4/5   Right Ankle Dorsiflexion 5/5   Left Ankle Dorsiflexion 5/5   6 minute walk test results    Aerobic Endurance Distance Walked 565   Endurance additional comments 3MWT, no rest no device    High Level Balance   High Level Balance Comments TUG 9.55                      [facility] Adult PT Treatment/Exercise - 11/29/15 0001    Lumbar Exercises: Supine   Ab Set 15 reps   AB Set Limitations 3 second holds    Bridge 10 reps   Straight Leg Raise 10 reps   Other Supine Lumbar Exercises supine hip ABD red TB 1x10                 PT Education - 11/29/15 1037    Education provided Yes   Education Details progress with skilled PT services, plan of care moving forward    Person(s) Educated Patient   Methods Explanation   Comprehension Verbalized understanding          PT Short Term Goals - 11/29/15 0927    PT SHORT TERM GOAL #1   Title Pt to be able to demonstrate proper body mechanics for changing body position in order to prevent future back injury    Baseline 5/3- reports this has improved    Time 2   Period Weeks   Status Partially Met   PT SHORT TERM GOAL #2   Title Pt to demonsrate good sitting posture and verbalize the importance of good posture in back care.    Baseline 5/3- reports that she is trying to do it correctly, more aware but does demonstrate minor impairment in sitting    Time 2   Period Weeks   Status Partially Met   PT SHORT TERM GOAL #3   Title Pt core strength to improve to allow pt to be able to complete bed mobility witnout difficulty or increased pain    Baseline 5/3- no problems    Time 2   Period Weeks   Status Achieved   PT SHORT  TERM GOAL #4   Title Pt to state that she is walking  15 minutes a day for improved back care and health    Baseline 5/3- reports she just started walking 15 minutes a day    Time 2   Period Weeks   Status Achieved           PT Long Term Goals - 11/29/15 0929    PT LONG TERM GOAL #1   Title Pt lower extremity strength to be increased one grade bilaterally to allow pt to get in and out of low cars without difficulty   Baseline 5/3- no trouble getting in and out of car, however remains weak in key groups    Time 4   Period Weeks   Status Partially Met   PT LONG TERM GOAL #2   Title Pt pain to be no greater than a 1/10 with standing/ walking activittes for over 2 hours to allow pt to shop without discomfort    Baseline 5/3- has been shopping for a couple hours, reports she has been OK but feels stil impaired    Time 4   Period Weeks   Status On-going   PT LONG TERM GOAL #3   Title Pt to state that she is walking at least 30 mintues five times a week for improved self back care and healty habits.    Baseline 5/3- just started with 15 minutes program   Time 4   Period Weeks   Status On-going   PT LONG TERM GOAL #4   Title Pt core strength to improve so that patient can vocalized that she is able to carry groceries into the house with confidence and no increased pain.    Baseline 5/3- able to do as long as she does not carry anything heavy    Time 4   Period Weeks   Status Achieved               Plan - 11/29/15 0935    Clinical Impression Statement Re-assessment performed today. Patient continues to demosntrate low back stiffness with poor form and multiple compensatiosn noted when attempting lateral flexion; she also continues to demonstrate weakness in key muscle groups however gait tolerance and balance appear generally WFL. Patient has started a walking program recently, and reports that really she is able to do everything she wants and needs as long as she is careful and takes her time. At this point recommend 2 more  skilled sessions to continue promoting adherence to walking program, to develop  advacned HEP, and to focus on functional strength, posture, and further on functional lifting form at this time.    Rehab Potential Good   PT Frequency Other (comment)  2 more sessions    PT Duration Other (comment)  2 more sessions    PT Treatment/Interventions ADLs/Self Care Home Management;Gait training;Stair training;Functional mobility training;Therapeutic activities;Therapeutic exercise;Balance training;Neuromuscular re-education;Patient/family education;Scar mobilization;Manual techniques   PT Next Visit Plan Focus on functional strengthening, proper lifting, and posture. Give advanced HEP for abductor and extensor strength, posture, balance. DC in 2 more sessions.    PT Home Exercise Plan given    Consulted and Agree with Plan of Care Patient      Patient will benefit from skilled therapeutic intervention in order to improve the following deficits and impairments:  Decreased activity tolerance, Decreased range of motion, Decreased strength, Pain, Postural dysfunction, Improper body mechanics  Visit Diagnosis: Difficulty in walking, not elsewhere classified - Plan: PT plan  of care cert/re-cert  Muscle weakness (generalized) - Plan: PT plan of care cert/re-cert  Midline low back pain without sciatica - Plan: PT plan of care cert/re-cert       G-Codes - 2015/11/30 1039    Functional Assessment Tool Used FOTO 44% limited    Functional Limitation Other PT primary   Other PT Primary Current Status (Y4034) At least 40 percent but less than 60 percent impaired, limited or restricted   Other PT Primary Goal Status (V4259) At least 40 percent but less than 60 percent impaired, limited or restricted      Problem List Patient Active Problem List   Diagnosis Date Noted  . Spinal stenosis of lumbar region 09/20/2015  . 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 Moderate persistent asthma 07/03/2015  . Allergic rhinitis  07/03/2015  . Colitis due to Clostridium difficile 02/10/2015  . Abdominal pain 02/10/2015  . Nausea and vomiting 02/10/2015  . Acute renal failure ([place]) 02/10/2015  . Dehydration 02/10/2015  . Hypoxia 02/10/2015  . Hyponatremia 02/10/2015  . Hypokalemia 02/10/2015  . COPD (chronic obstructive pulmonary disease) ([place])   . Unspecified constipation 07/15/2013  . DYSPNEA 04/05/2008  . Essential hypertension 03/10/2008  . HEADACHE 03/10/2008  . COUGH 03/10/2008    Deniece Ree PT, DPT [place] [address] [place], [place], [address] Phone: [phone]   Fax:  [phone]  Name: EMIRETH COCKERHAM MRN: [id number] Date of Birth: 1945-09-03

## 2015-12-08 ENCOUNTER — Encounter (HOSPITAL_COMMUNITY): Payer: Medicare Other | Admitting: Physical Therapy

## 2015-12-12 ENCOUNTER — Ambulatory Visit (HOSPITAL_COMMUNITY): Payer: Medicare Other | Admitting: Physical Therapy

## 2015-12-12 DIAGNOSIS — M545 Low back pain, unspecified: Secondary | ICD-10-CM

## 2015-12-12 DIAGNOSIS — R262 Difficulty in walking, not elsewhere classified: Secondary | ICD-10-CM

## 2015-12-12 DIAGNOSIS — M6281 Muscle weakness (generalized): Secondary | ICD-10-CM

## 2015-12-12 NOTE — Therapy (Signed)
[place], [place], [address] Phone: [phone]   Fax:  [phone]  Physical Therapy Treatment  Patient Details  Name: Betty Golden MRN: [id number] Date of Birth: 1946/06/23 Referring Provider: Susa Day   Encounter Date: 12/12/2015      PT End of Session - 12/12/15 1113    Visit Number 8   Number of Visits 9   Date for PT Re-Evaluation 12/27/15   Authorization Type [employer]BS medicare (G codes done 7th visit)   Authorization - Visit Number 8   Authorization - Number of Visits 8   PT Start Time 1033   PT Stop Time 1112   PT Time Calculation (min) 39 min   Activity Tolerance Patient tolerated treatment well   Behavior During Therapy [facility] for tasks assessed/performed      Past Medical History  Diagnosis Date  . Hypertension   . High cholesterol   . Chronic headaches   . Asthma   . Shortness of breath     SOB with exertion  . Colitis     01/2015  . Diverticulosis   . Lung nodule     per CT stable/benign  . Hypoxia   . C. difficile colitis   . HNP (herniated nucleus pulposus), lumbar   . Arthritis   . GERD (gastroesophageal reflux disease)   . Depression     Past Surgical History  Procedure Laterality Date  . Appendectomy    . Cholecystectomy    . Breast reduction surgery    . Tonsillectomy    . Colonoscopy N/A 08/19/2013    Procedure: COLONOSCOPY;  Surgeon: Rogene Houston, MD;  [facility];  Service: Endoscopy;  Laterality: N/A;  155-moved to 140 Ann to notify pt  . Lumbar laminectomy/decompression microdiscectomy Bilateral 09/20/2015    Procedure: MICRO LUMBAR DECOMPRESSION L5-S1 BILATERAL    (1 LEVEL);  Surgeon: Susa Day, MD;  [facility];  Service: Orthopedics;  Laterality: Bilateral;    There were no vitals filed for this visit.      Subjective Assessment - 12/12/15 1035    Subjective Pt reports she is still walking regularly and her legs are no longer  bothering her. She has been trying to do her HEP every day, "some of them". No other complaints at this time.   Currently in Pain? No/denies                         [facility] Adult PT Treatment/Exercise - 12/12/15 0001    Exercises   Exercises Other Exercises   Other Exercises  B hip flexor stretch 2x30 sec each.    Lumbar Exercises: Stretches   Active Hamstring Stretch 3 reps;30 seconds   Active Hamstring Stretch Limitations standing    Lumbar Exercises: Standing   Functional Squats 15 reps   Functional Squats Limitations min cues for technique   Lumbar Exercises: Supine   Bent Knee Raise 20 reps   Bridge 10 reps   Other Supine Lumbar Exercises alt bent knee lowering x10 reps, cues for correct technique   Lumbar Exercises: Sidelying   Hip Abduction 10 reps   Hip Abduction Limitations against wall                 PT Education - 12/12/15 1112    Education provided Yes   Education Details updated HEP; correct technique with therex   Person(s) Educated Patient   Methods Explanation;Demonstration;Handout  Comprehension Verbalized understanding;Verbal cues required;Returned demonstration          PT Short Term Goals - 11/29/15 0927    PT SHORT TERM GOAL #1   Title Pt to be able to demonstrate proper body mechanics for changing body position in order to prevent future back injury    Baseline 5/3- reports this has improved    Time 2   Period Weeks   Status Partially Met   PT SHORT TERM GOAL #2   Title Pt to demonsrate good sitting posture and verbalize the importance of good posture in back care.    Baseline 5/3- reports that she is trying to do it correctly, more aware but does demonstrate minor impairment in sitting    Time 2   Period Weeks   Status Partially Met   PT SHORT TERM GOAL #3   Title Pt core strength to improve to allow pt to be able to complete bed mobility witnout difficulty or increased pain    Baseline 5/3- no problems    Time 2    Period Weeks   Status Achieved   PT SHORT TERM GOAL #4   Title Pt to state that she is walking 15 minutes a day for improved back care and health    Baseline 5/3- reports she just started walking 15 minutes a day    Time 2   Period Weeks   Status Achieved           PT Long Term Goals - 11/29/15 0929    PT LONG TERM GOAL #1   Title Pt lower extremity strength to be increased one grade bilaterally to allow pt to get in and out of low cars without difficulty   Baseline 5/3- no trouble getting in and out of car, however remains weak in key groups    Time 4   Period Weeks   Status Partially Met   PT LONG TERM GOAL #2   Title Pt pain to be no greater than a 1/10 with standing/ walking activittes for over 2 hours to allow pt to shop without discomfort    Baseline 5/3- has been shopping for a couple hours, reports she has been OK but feels stil impaired    Time 4   Period Weeks   Status On-going   PT LONG TERM GOAL #3   Title Pt to state that she is walking at least 30 mintues five times a week for improved self back care and healty habits.    Baseline 5/3- just started with 15 minutes program   Time 4   Period Weeks   Status On-going   PT LONG TERM GOAL #4   Title Pt core strength to improve so that patient can vocalized that she is able to carry groceries into the house with confidence and no increased pain.    Baseline 5/3- able to do as long as she does not carry anything heavy    Time 4   Period Weeks   Status Achieved               Plan - 12/12/15 1113    Clinical Impression Statement Today's session focused on updating HEP with therex to improved LE strength and flexibility with therapist providing cues for correct technique and performance. Pt remaining pain free this session, however she is inconsistent with her HEP. Therapist encouraged her to perform all exercises atleast 3x a week in order to maintain her strength/flexibility. Pt verbalizing understanding at this  time. Will anticipate d/c next visit if no complications/issues arise.   Rehab Potential Good   PT Frequency Other (comment)  2 more sessions    PT Duration Other (comment)  1 more session   PT Treatment/Interventions ADLs/Self Care Home Management;Gait training;Stair training;Functional mobility training;Therapeutic activities;Therapeutic exercise;Balance training;Neuromuscular re-education;Patient/family education;Scar mobilization;Manual techniques   PT Next Visit Plan Focus on functional strengthening, proper lifting, and posture. Give advanced HEP for abductor and extensor strength, posture, balance. DC in 2 more sessions.    PT Home Exercise Plan brdige, hip abd, ab set with alt knee flex, ab set with bent knee lower, squats, hamstring stretch, hip flexor stretch, scap retraction, shoulder rows   Consulted and Agree with Plan of Care Patient      Patient will benefit from skilled therapeutic intervention in order to improve the following deficits and impairments:  Decreased activity tolerance, Decreased range of motion, Decreased strength, Pain, Postural dysfunction, Improper body mechanics  Visit Diagnosis: Difficulty in walking, not elsewhere classified  Muscle weakness (generalized)  Midline low back pain without sciatica     Problem List Patient Active Problem List   Diagnosis Date Noted  . Spinal stenosis of lumbar region 09/20/2015  . 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 Moderate persistent asthma 07/03/2015  . Allergic rhinitis 07/03/2015  . Colitis due to Clostridium difficile 02/10/2015  . Abdominal pain 02/10/2015  . Nausea and vomiting 02/10/2015  . Acute renal failure ([place]) 02/10/2015  . Dehydration 02/10/2015  . Hypoxia 02/10/2015  . Hyponatremia 02/10/2015  . Hypokalemia 02/10/2015  . COPD (chronic obstructive pulmonary disease) ([place])   . Unspecified constipation 07/15/2013  . DYSPNEA 04/05/2008  . Essential hypertension 03/10/2008  . HEADACHE 03/10/2008  .  COUGH 03/10/2008   11:23 AM,12/12/2015 Elly Modena PT, DPT [facility] [place] [address] [place], [place], [address] Phone: [phone]   Fax:  [phone]  Name: MARRISA KIMBER MRN: [id number] Date of Birth: [birth year]

## 2015-12-13 ENCOUNTER — Encounter (HOSPITAL_COMMUNITY): Payer: Medicare Other | Admitting: Physical Therapy

## 2015-12-20 ENCOUNTER — Encounter (HOSPITAL_COMMUNITY): Payer: Medicare Other | Admitting: Physical Therapy

## 2015-12-21 ENCOUNTER — Encounter (INDEPENDENT_AMBULATORY_CARE_PROVIDER_SITE_OTHER): Payer: Self-pay | Admitting: Internal Medicine

## 2015-12-26 ENCOUNTER — Ambulatory Visit (HOSPITAL_COMMUNITY): Payer: Medicare Other | Admitting: Physical Therapy

## 2015-12-26 DIAGNOSIS — R262 Difficulty in walking, not elsewhere classified: Secondary | ICD-10-CM

## 2015-12-26 DIAGNOSIS — M6281 Muscle weakness (generalized): Secondary | ICD-10-CM

## 2015-12-26 DIAGNOSIS — M545 Low back pain, unspecified: Secondary | ICD-10-CM

## 2015-12-26 NOTE — Patient Instructions (Signed)
   BODY MECHANICS - WAIST HEIGHT LIFTING  Start by standing close to the object with feet spread apart. Bend at the knees and hips and NOT at your spine.   Hold the object close to your body as you use your legs muscles to stand back up lifting the object.   Walk over to the surface you want to set the object on to and set it down. Be sure to NOT twist your spine but to pivot your feet so that your feet are pointed forward to where you want to set the object.   Slide the object on the shelf to off load your body.      BODY MECHANICS - KNEE HEIGHT LIFTING  Start by standing close to the object with feet spread apart. Bend at the knees and hips and NOT at your spine.   Hold the object close to your body as you use your legs muscles to stand back up lifting the object.   Walk over to the surface you want to set the object on to and set it down bending at the knees slightly. Do Not bend at the spine. Also, be sure NOT to twist your spine but to pivot your feet so that your feet are pointed forward to where you want to set the object.   Slide the object on the shelf to off load your body.    BODY MECHANICS - OVER HEAD LIFTING  Start by standing close to the object with feet spread apart. Bend at the knees and hips and NOT at your spine.   Hold the object close to your body as you use your legs muscles to stand back up lifting the object.   Walk over to the surface you want to set the object on and raise it up over head with a "one-hand-under and one-hand-over" technique as shown. Set it down and DO NOT extend at the spine. Also, be sure NOT to twist your spine but to pivot your feet so that your feet are pointed forward to where you want to set the object.   Slide the object on the shelf to off load your body.

## 2015-12-26 NOTE — Therapy (Signed)
[place], [place], [address] Phone: [phone]   Fax:  [phone]  Physical Therapy Treatment (Discharge)  Patient Details  Name: Betty Golden MRN: [id number] Date of Birth: June 21, 1946 Referring Provider: Susa Day   Encounter Date: 12/26/2015      PT End of Session - 12/26/15 0931    Visit Number 9   Number of Visits 9   Authorization Type [employer] medicare (G codes done 9th visit)   PT Start Time 0900   PT Stop Time 0928  no furhter skilled PT services warranted   PT Time Calculation (min) 28 min   Activity Tolerance Patient tolerated treatment well   Behavior During Therapy [facility] for tasks assessed/performed      Past Medical History  Diagnosis Date  . Hypertension   . High cholesterol   . Chronic headaches   . Asthma   . Shortness of breath     SOB with exertion  . Colitis     01/2015  . Diverticulosis   . Lung nodule     per CT stable/benign  . Hypoxia   . C. difficile colitis   . HNP (herniated nucleus pulposus), lumbar   . Arthritis   . GERD (gastroesophageal reflux disease)   . Depression     Past Surgical History  Procedure Laterality Date  . Appendectomy    . Cholecystectomy    . Breast reduction surgery    . Tonsillectomy    . Colonoscopy N/A 08/19/2013    Procedure: COLONOSCOPY;  Surgeon: Rogene Houston, MD;  Location: [facility]O [facility];  Service: Endoscopy;  Laterality: N/A;  155-moved to 140 Ann to notify pt  . Lumbar laminectomy/decompression microdiscectomy Bilateral 09/20/2015    Procedure: MICRO LUMBAR DECOMPRESSION L5-S1 BILATERAL    (1 LEVEL);  Surgeon: Susa Day, MD;  Location: [facility];  Service: Orthopedics;  Laterality: Bilateral;    There were no vitals filed for this visit.      Subjective Assessment - 12/26/15 0901    Subjective Patient arrives reporting that today is her last day, she doesn't have any difficulty with anything and her legs are still not  bothering her. Things are going "OK" with her HEP, reports she is not doing them every day but is still doing them from time to time. No falls or close calls recently.    Pertinent History HTN,    How long can you sit comfortably? 5/30- 60 minutes    How long can you stand comfortably? 5/30- patient reports she is able to stand longer than 20 minutes    How long can you walk comfortably? 5/30- 45 minutes    Patient Stated Goals To learn proper body mechanics so she doesn't reinjure her back; learn exercises for her back    Currently in Pain? No/denies            [facility] PT Assessment - 12/26/15 0001    Observation/Other Assessments   Focus on Therapeutic Outcomes (FOTO)  2 % limited    AROM   Lumbar Extension 18   Lumbar - Right Side Bend --  hip flexion/trunk rotaiton    Lumbar - Left Side Bend --  hip flexion/trunk rotation    Strength   Right Hip Flexion 4+/5   Right Hip Extension 2+/5  compensations present    Right Hip ABduction 4/5   Left Hip Flexion 5/5   Left Hip Extension 2+/5  compensations present    Left  Hip ABduction 4/5   Right Knee Flexion 4+/5   Right Knee Extension 4+/5   Left Knee Flexion 4+/5   Left Knee Extension 4+/5   Right Ankle Dorsiflexion 5/5   Left Ankle Dorsiflexion 5/5   6 minute walk test results    Aerobic Endurance Distance Walked 1362   Endurance additional comments 6MWT    High Level Balance   High Level Balance Comments TUG 7.28                             PT Education - 12/26/15 0931    Education provided Yes   Education Details progress wtih skilled PT services, DC today, correct functional lifiting/biomechanics, [employer] waiver    Person(s) Educated Patient   Methods Explanation;Handout   Comprehension Verbalized understanding          PT Short Term Goals - 12/26/15 0920    PT SHORT TERM GOAL #1   Title Pt to be able to demonstrate proper body mechanics for changing body position in order to prevent future  back injury    Baseline 5/30- remains impaired, reducated today    Time 2   Period Weeks   Status Not Met   PT SHORT TERM GOAL #2   Title Pt to demonsrate good sitting posture and verbalize the importance of good posture in back care.    Baseline 5/30- reports improved awarenses however postural impairment remains    Time 2   Period Weeks   Status Partially Met   PT SHORT TERM GOAL #3   Title Pt core strength to improve to allow pt to be able to complete bed mobility witnout difficulty or increased pain    Time 2   Period Weeks   Status Achieved   PT SHORT TERM GOAL #4   Title Pt to state that she is walking 15 minutes a day for improved back care and health    Baseline 5/30- still walking 15 minutes but not every day but plans to become more adherent to it    Time 2   Period Weeks   Status Partially Met           PT Long Term Goals - 12/26/15 2633    PT LONG TERM GOAL #1   Title Pt lower extremity strength to be increased one grade bilaterally to allow pt to get in and out of low cars without difficulty   Baseline 5/30- no trouble with functional transfers, but remains waeak    Time 4   Period Weeks   Status Partially Met   PT LONG TERM GOAL #2   Title Pt pain to be no greater than a 1/10 with standing/ walking activittes for over 2 hours to allow pt to shop without discomfort    Baseline 5/30- improved    Period Weeks   Status Achieved   PT LONG TERM GOAL #3   Title Pt to state that she is walking at least 30 mintues five times a week for improved self back care and healty habits.    Time 4   Period Weeks   Status Not Met   PT LONG TERM GOAL #4   Title Pt core strength to improve so that patient can vocalized that she is able to carry groceries into the house with confidence and no increased pain.    Time 4   Period Weeks   Status Achieved  Plan - 21-Jan-2016 0932    Clinical Impression Statement Discharge assessment performed today. Patient  doing very well with objective measures however does continue to reveal compensation tendencies with lateral lumbar flexion as well as signficant weakness in key functional groups upon MMT. However patient reports that she is able to do everything she needs and wants at this point, and  reports she is ready to be done with PT; she has not been 100% compliant with HEP, so not updates made today however did provide patient with education regarding proper lifting strategies as well as [employer] waiver and was encouraged to perform regular physical activity. DC today due to patient being satisfied with overall level of function.    Rehab Potential Good   PT Next Visit Plan DC today.    Consulted and Agree with Plan of Care Patient      Patient will benefit from skilled therapeutic intervention in order to improve the following deficits and impairments:  Decreased activity tolerance, Decreased range of motion, Decreased strength, Pain, Postural dysfunction, Improper body mechanics  Visit Diagnosis: Difficulty in walking, not elsewhere classified  Muscle weakness (generalized)  Midline low back pain without sciatica       G-Codes - 2016/01/21 0934    Functional Assessment Tool Used FOTO 2% limited; based on skilled clinical assessment of ROM, strength, balance, gait, estimate approximately 15-20% limited    Functional Limitation Other PT primary   Other PT Primary Goal Status (Z6109) At least 40 percent but less than 60 percent impaired, limited or restricted   Other PT Primary Discharge Status (U0454) At least 20 percent but less than 40 percent impaired, limited or restricted      Problem List Patient Active Problem List   Diagnosis Date Noted  . Spinal stenosis of lumbar region 09/20/2015  . 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 Moderate persistent asthma 07/03/2015  . Allergic rhinitis 07/03/2015  . Colitis due to Clostridium difficile 02/10/2015  . Abdominal pain 02/10/2015  . Nausea and vomiting  02/10/2015  . Acute renal failure ([place]) 02/10/2015  . Dehydration 02/10/2015  . Hypoxia 02/10/2015  . Hyponatremia 02/10/2015  . Hypokalemia 02/10/2015  . COPD (chronic obstructive pulmonary disease) ([place])   . Unspecified constipation 07/15/2013  . DYSPNEA 04/05/2008  . Essential hypertension 03/10/2008  . HEADACHE 03/10/2008  . COUGH 03/10/2008   PHYSICAL THERAPY DISCHARGE SUMMARY  Visits from Start of Care: 9  Current functional level related to goals / functional outcomes: Patient arrives reporting she is doing very well overall and that she wants to be done with PT today; she is able to do everything she needs and wants and cannot identify any limiting factors or activities at this time.    Remaining deficits: Poor functional lifting mechanics, functional weakness    Education / Equipment: Correct proper lifting, [employer] waiver, DC today Plan: Patient agrees to discharge.  Patient goals were partially met. Patient is being discharged due to being pleased with the current functional level.  ?????        Deniece Ree PT, DPT [facility] [address] [place], [place], [address] Phone: [phone]   Fax:  [phone]  Name: Betty Golden MRN: [id number] Date of Birth: 09-Aug-1945

## 2016-01-02 ENCOUNTER — Ambulatory Visit: Payer: Medicare Other | Admitting: Allergy and Immunology

## 2016-01-09 ENCOUNTER — Encounter: Payer: Self-pay | Admitting: Allergy and Immunology

## 2016-01-09 ENCOUNTER — Ambulatory Visit (INDEPENDENT_AMBULATORY_CARE_PROVIDER_SITE_OTHER): Payer: Medicare Other | Admitting: Allergy and Immunology

## 2016-01-09 VITALS — BP 130/60 | HR 71 | Temp 97.8°F | Resp 15

## 2016-01-09 DIAGNOSIS — R05 Cough: Secondary | ICD-10-CM | POA: Diagnosis not present

## 2016-01-09 DIAGNOSIS — J310 Chronic rhinitis: Secondary | ICD-10-CM

## 2016-01-09 DIAGNOSIS — R062 Wheezing: Secondary | ICD-10-CM | POA: Diagnosis not present

## 2016-01-09 DIAGNOSIS — R059 Cough, unspecified: Secondary | ICD-10-CM

## 2016-01-09 MED ORDER — MONTELUKAST SODIUM 10 MG PO TABS: 10.0000 mg | ORAL_TABLET | Freq: Every day | ORAL | Status: DC

## 2016-01-09 MED ORDER — MOMETASONE FURO-FORMOTEROL FUM 200-5 MCG/ACT IN AERO: 2.0000 | INHALATION_SPRAY | Freq: Two times a day (BID) | RESPIRATORY_TRACT | Status: DC

## 2016-01-09 NOTE — Progress Notes (Signed)
     FOLLOW UP NOTE  RE: MIKAIYA TRAMBLE MRN: [id number] DOB: August 24, 1945 [facility] [place] [facility] [address] [place], [place] [address] Date of Office Visit: 01/09/2016  Subjective:  Betty Golden is a 70 y.o. female who presents today for Headache; Nasal Congestion; and Cough  Assessment:   1. Chronic rhinitis, afebrile with mild nasal congestion, status post azithromycin and single cortisone injection.   2. History of Cough and wheeze appears consistent with persistent asthma, in no respiratory distress clear lung exam and normal in office spirometry.     3. Intermittent persisting symptoms.    Plan:   Meds ordered this encounter  Medications  . montelukast (SINGULAIR) 10 MG tablet    Sig: Take 1 tablet (10 mg total) by mouth at bedtime.    Dispense:  30 tablet    Refill:  5  . mometasone-formoterol (DULERA) 200-5 MCG/ACT AERO    Sig: Inhale 2 puffs into the lungs 2 (two) times daily.    Dispense:  1 Inhaler    Refill:  3  1.  Give trial of Xyzal 5 mg daily --- discontinue Allegra.  2.  Saline nasal wash 2-4 times daily. 3.  For Duration of sample use, Dymista one spray twice daily--then return to previous nasal sprays. 4.  Continue Dulera 2 puffs twice daily, rinse, gargle and spit after use. 5.  Continue Singulair 10 mg daily. 6.  Prednisone 30 mg today. 7.  Follow-up in 4-6 months or sooner if needed --- consider sinus CT scan if persisting difficulty.  HPI: Brittani returns to the office with recent nasal congestion, postnasal drip and cough, last seen here December 2016.  She describes slight sinus pressure, which has persisted over the last few weeks, prompting an urgent care visit where she completed a course of azithromycin and received cortisone injection.  She has maintained on Dulera and recently added Pro-air 2 puffs once a day, using Singulair, Allegra, Nasonex and azelastine daily.  She denies any difficulty breathing, wheezing, chest  tightness/congestion or shortness of breath but has noted throat clearing.  Denies any discolored drainage, headache, or sore throat.  Denies ED visits. Reports sleep and activity are normal.  Justice has a current medication list which includes the following prescription(s): acetaminophen, azelastine hcl, clobetasol cream, docusate sodium, dulera, fexofenadine, fluoxetine, losartan-hydrochlorothiazide, montelukast, omeprazole, proair respiclick, simvastatin, tretinoin, mometasone.   Drug Allergies: Allergies  Allergen Reactions  . Sulfa Antibiotics Rash   Objective:   Filed Vitals:   01/09/16 1146  BP: 130/60  Pulse: 71  Temp: 97.8 F (36.6 C)  Resp: 15   SpO2 Readings from Last 1 Encounters:  01/09/16 96%   Physical Exam  Constitutional: She is well-developed, well-nourished, and in no distress.  HENT:  Head: Atraumatic.  Right Ear: Tympanic membrane and ear canal normal.  Left Ear: Tympanic membrane and ear canal normal.  Nose: Mucosal edema present. No rhinorrhea. No epistaxis.  Mouth/Throat: Oropharynx is clear and moist and mucous membranes are normal. No oropharyngeal exudate, posterior oropharyngeal edema or posterior oropharyngeal erythema.  Neck: Neck supple.  Cardiovascular: Normal rate, S1 normal and S2 normal.   No murmur heard. Pulmonary/Chest: Effort normal. She has no wheezes. She has no rhonchi. She has no rales.  Lymphadenopathy:    She has no cervical adenopathy.   Diagnostics: Spirometry:  FVC 2.06--- 96%, FEV1 1.44--- 81%.    Oberia Beaudoin M. Willa Rough, MD  cc: Zachery Dauer, MD

## 2016-01-09 NOTE — Patient Instructions (Addendum)
     Give trial of Xyzal 5 mg daily --- hold Allegra   Saline nasal wash 2-4 times daily.  For Duration of sample use, Dymista one spray twice daily--then return to previous nasal sprays.  Continue Dulera 2 puffs twice daily, rinse, gargle and spit after use.  Continue Singulair 10 mg daily.  Prednisone 30 mg today.  Follow-up in 4-6 months or sooner if needed --- consider sinus CT scan if persisting difficulty.

## 2016-04-02 ENCOUNTER — Ambulatory Visit (INDEPENDENT_AMBULATORY_CARE_PROVIDER_SITE_OTHER): Payer: Medicare Other | Admitting: Internal Medicine

## 2016-04-04 ENCOUNTER — Other Ambulatory Visit (INDEPENDENT_AMBULATORY_CARE_PROVIDER_SITE_OTHER): Payer: Self-pay | Admitting: Internal Medicine

## 2016-04-04 DIAGNOSIS — R1013 Epigastric pain: Secondary | ICD-10-CM

## 2016-04-29 ENCOUNTER — Encounter (INDEPENDENT_AMBULATORY_CARE_PROVIDER_SITE_OTHER): Payer: Self-pay | Admitting: Internal Medicine

## 2016-04-29 ENCOUNTER — Ambulatory Visit (INDEPENDENT_AMBULATORY_CARE_PROVIDER_SITE_OTHER): Payer: Medicare Other | Admitting: Internal Medicine

## 2016-04-29 VITALS — BP 116/48 | HR 64 | Temp 97.8°F | Ht 64.0 in | Wt 162.7 lb

## 2016-04-29 DIAGNOSIS — A0472 Enterocolitis due to Clostridium difficile, not specified as recurrent: Secondary | ICD-10-CM

## 2016-04-29 NOTE — Patient Instructions (Signed)
OV in 1 year.  

## 2016-04-29 NOTE — Progress Notes (Signed)
Subjective:    Patient ID: Betty Golden, female    DOB: Oct 24, 1945, 70 y.o.   MRN: [id number]  HPI Here today for f/u. Hx of C diff and Salmonella.  [facility] to [facility] in July of last year with C.Diff. She was covered with Vancomycin 125mg  QID x 14 days after discharge.  She tells me she is doing good. She does have some constipation and usually has a BM daily. She is taking Fiber daily. She does use a stool sofetner for her constipation. She says sometimes she see blood when she wipes. She wipes inside of rectum. Her appetite is good. She has gained 1.7 pounds since her last visit. No abdominal pain.   08/19/2013 Colonoscopy Indications: Patient is 70 year old African American female who is undergoing screening colonoscopy. Her last exam was 10 years ago. She does report having had hematochezia since her colonoscopy was scheduled.  Impression:  Examination performed to cecum. 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 Internal/external hemorrhoids.   Review of Systems   Past Medical History:  Diagnosis Date  . Arthritis   . Asthma   . C. difficile colitis   . Chronic headaches   . Colitis    01/2015  . Depression   . Diverticulosis   . GERD (gastroesophageal reflux disease)   . High cholesterol   . HNP (herniated nucleus pulposus), lumbar   . Hypertension   . Hypoxia   . Lung nodule    per CT stable/benign  . Shortness of breath    SOB with exertion    Past Surgical History:  Procedure Laterality Date  . APPENDECTOMY    . BREAST REDUCTION SURGERY    . CHOLECYSTECTOMY    . COLONOSCOPY N/A 08/19/2013   Procedure: COLONOSCOPY;  Surgeon: 08/21/2013, MD;  Location: [facility];  Service: Endoscopy;  Laterality: N/A;  155-moved to 140 Ann to notify pt  . LUMBAR LAMINECTOMY/DECOMPRESSION MICRODISCECTOMY Bilateral 09/20/2015   Procedure: MICRO LUMBAR DECOMPRESSION L5-S1 BILATERAL    (1 LEVEL);  Surgeon: 09/22/2015, MD;   Location: [facility];  Service: Orthopedics;  Laterality: Bilateral;  . TONSILLECTOMY      Allergies  Allergen Reactions  . Sulfa Antibiotics Rash    Current Outpatient Prescriptions on File Prior to Visit  Medication Sig Dispense Refill  . acetaminophen (TYLENOL) 500 MG tablet Take 1 tablet (500 mg total) by mouth every 6 (six) hours as needed for mild pain. Do not exceed 4 grams of acetaminophen daily in combination with Norco  0  . Azelastine HCl 0.15 % SOLN Place 2 sprays into the nose at bedtime.   5  . docusate sodium (COLACE) 100 MG capsule Take 1 capsule (100 mg total) by mouth 2 (two) times daily as needed for mild constipation. 20 capsule 1  . fexofenadine (ALLEGRA) 180 MG tablet Take 180 mg by mouth daily.    [name] FLUoxetine (PROZAC) 20 MG capsule Take 20 mg by mouth daily.    Marland Kitchen losartan-hydrochlorothiazide (HYZAAR) 100-12.5 MG per tablet Take 1 tablet by mouth daily.    . mometasone (NASONEX) 50 MCG/ACT nasal spray Place 2 sprays into both nostrils daily. Reported on 01/09/2016    . mometasone-formoterol (DULERA) 200-5 MCG/ACT AERO Inhale 2 puffs into the lungs 2 (two) times daily. 1 Inhaler 3  . montelukast (SINGULAIR) 10 MG tablet Take 1 tablet (10 mg total) by mouth at bedtime. 30 tablet 5  . simvastatin (ZOCOR) 20 MG tablet Take 20 mg by  mouth at bedtime.     . tretinoin (RETIN-A) 0.05 % cream Apply 1 application topically at bedtime.  12  . clobetasol cream (TEMOVATE) 0.05 % Apply 1 application topically 2 (two) times daily as needed. For rash (not to face, groin, or underarms)  2  . HYDROcodone-acetaminophen (NORCO/VICODIN) 5-325 MG tablet Take 1-2 tablets by mouth every 4 (four) hours as needed for severe pain. (Patient not taking: Reported on 04/29/2016) 40 tablet 0  . omeprazole (PRILOSEC) 20 MG capsule TAKE ONE CAPSULE BY MOUTH EVERY DAY (Patient not taking: Reported on 04/29/2016) 90 capsule 3  . PROAIR RESPICLICK 108 (90 BASE) MCG/ACT AEPB Inhale 2 puffs into the lungs every 4  (four) hours as needed (cough/wheezing).   1  . traMADol (ULTRAM) 50 MG tablet Take 1 tablet (50 mg total) by mouth every 6 (six) hours as needed for moderate pain. (Patient not taking: Reported on 04/29/2016) 60 tablet 1   No current facility-administered medications on file prior to visit.        Objective:   Physical Exam Blood pressure (!) 116/48, pulse 64, temperature 97.8 F (36.6 C), height 5\' 4"  (1.626 m), weight 162 lb 11.2 oz (73.8 kg).  Alert and oriented. Skin warm and dry. Oral mucosa is moist.   . Sclera anicteric, conjunctivae is pink. Thyroid not enlarged. No cervical lymphadenopathy. Lungs clear. Heart regular rate and rhythm.  Abdomen is soft. Bowel sounds are positive. No hepatomegaly. No abdominal masses felt. No tenderness.  No edema to lower extremities. Patient is alert and oriented.       Assessment & Plan:  C diff. Her stools are formed. No problems at this time. Occasonally sees blood when she inserts her fingers in her rectum to wipe.

## 2016-05-14 ENCOUNTER — Ambulatory Visit: Payer: Medicare Other | Admitting: Allergy & Immunology

## 2016-05-27 ENCOUNTER — Encounter: Payer: Self-pay | Admitting: Cardiology

## 2016-05-27 NOTE — Progress Notes (Signed)
Cardiology Office Note  Date: 05/28/2016   ID: Betty Golden, DOB Dec 03, 1945, MRN [id number]  PCP: Zachery Dauer, MD  Consulting Cardiologist: Nona Dell, MD   Chief Complaint  Patient presents with  . Fatigue  . Dyspnea on exertion    History of Present Illness: Betty Golden is a 70 y.o. female referred for cardiology consultation by Dr. Dorna Leitz. She presents with somewhat vague symptomatology, but indicates a feeling of intense fatigue and dyspnea on exertion that was present for most of last month, only now starting to get somewhat better. She has had these symptoms at rest and with her typical ADLs. Also experiencing sporadic sharp chest discomfort, not clearly exertional. She states that she underwent cardiac ischemic testing approximately 17 years ago in [place], has not had any follow-up evaluations.  Cardiac risk factors include hypertension and hyperlipidemia, also heart disease in her parents. I reviewed her ECG today which shows sinus rhythm, inferolateral Q waves.  We discussed her medications which are outlined below. She denies any recent changes in therapy.  She also tells me that she has been experiencing a tightness in her lower legs, notable at night time, less so when she is standing or walking. No definite history of PAD or claudication. She reports having back surgery earlier in the year.  Past Medical History:  Diagnosis Date  . Arthritis   . Asthma   . C. difficile colitis   . Chronic headaches   . Depression   . Diverticulosis   . Essential hypertension   . GERD (gastroesophageal reflux disease)   . History of Salmonella gastroenteritis   . HNP (herniated nucleus pulposus), lumbar   . Hyperlipidemia   . Lung nodule     Past Surgical History:  Procedure Laterality Date  . APPENDECTOMY    . BREAST REDUCTION SURGERY    . CHOLECYSTECTOMY    . COLONOSCOPY N/A 08/19/2013   Procedure: COLONOSCOPY;  Surgeon: Malissa Hippo, MD;  Location:  [facility];  Service: Endoscopy;  Laterality: N/A;  155-moved to 140 Ann to notify pt  . LUMBAR LAMINECTOMY/DECOMPRESSION MICRODISCECTOMY Bilateral 09/20/2015   Procedure: MICRO LUMBAR DECOMPRESSION L5-S1 BILATERAL    (1 LEVEL);  Surgeon: [name], MD;  Location: [facility];  Service: Orthopedics;  Laterality: Bilateral;  . TONSILLECTOMY      Current Outpatient Prescriptions  Medication Sig Dispense Refill  . acetaminophen (TYLENOL) 500 MG tablet Take 1 tablet (500 mg total) by mouth every 6 (six) hours as needed for mild pain. Do not exceed 4 grams of acetaminophen daily in combination with Norco  0  . Azelastine HCl 0.15 % SOLN Place 2 sprays into the nose at bedtime.   5  . docusate sodium (COLACE) 100 MG capsule Take 1 capsule (100 mg total) by mouth 2 (two) times daily as needed for mild constipation. 20 capsule 1  . fexofenadine (ALLEGRA) 180 MG tablet Take 180 mg by mouth daily.    Marland Kitchen FLUoxetine (PROZAC) 20 MG capsule Take 20 mg by mouth daily.    Marland Kitchen HYDROcodone-acetaminophen (NORCO/VICODIN) 5-325 MG tablet Take 1-2 tablets by mouth every 4 (four) hours as needed for severe pain. 40 tablet 0  . losartan-hydrochlorothiazide (HYZAAR) 100-12.5 MG per tablet Take 1 tablet by mouth daily.    . mometasone (NASONEX) 50 MCG/ACT nasal spray Place 2 sprays into both nostrils daily. Reported on 01/09/2016    . mometasone-formoterol (DULERA) 200-5 MCG/ACT AERO Inhale 2 puffs into the lungs 2 (two) times daily.  1 Inhaler 3  . montelukast (SINGULAIR) 10 MG tablet Take 1 tablet (10 mg total) by mouth at bedtime. 30 tablet 5  . simvastatin (ZOCOR) 20 MG tablet Take 20 mg by mouth at bedtime.     . tretinoin (RETIN-A) 0.05 % cream Apply 1 application topically at bedtime.  12   No current facility-administered medications for this visit.    Allergies:  Sulfa antibiotics   Social History: The patient  reports that she quit smoking about 27 years ago. Her smoking use included Cigarettes. She has a 20.00  pack-year smoking history. She has never used smokeless tobacco. She reports that she does not drink alcohol or use drugs.   Family History: The patient's family history includes Heart disease in her father and mother; Hypertension in her brother, father, and mother; Stroke in her father and sister.   ROS:  Please see the history of present illness. Otherwise, complete review of systems is positive for none.  All other systems are reviewed and negative.   Physical Exam: VS:  BP 132/70 (BP Location: Left Arm)   Pulse 77   Ht 5\' 4"  (1.626 m)   Wt 162 lb (73.5 kg)   SpO2 98%   BMI 27.81 kg/m , BMI Body mass index is 27.81 kg/m.  Wt Readings from Last 3 Encounters:  05/28/16 162 lb (73.5 kg)  04/29/16 162 lb 11.2 oz (73.8 kg)  09/20/15 161 lb (73 kg)    General: Patient appears comfortable at rest. HEENT: Conjunctiva and lids normal, oropharynx clear. Neck: Supple, no elevated JVP or carotid bruits, no thyromegaly. Lungs: Clear to auscultation, nonlabored breathing at rest. Cardiac: Regular rate and rhythm, no S3 or significant systolic murmur, no pericardial rub. Abdomen: Soft, nontender, bowel sounds present, no guarding or rebound. Extremities: No pitting edema, normal radial pulses, 1+ dorsalis pedis bilaterally. Skin: Warm and dry. Musculoskeletal: No kyphosis. Neuropsychiatric: Alert and oriented x3, affect grossly appropriate.  ECG: I personally reviewed the tracing from 03/09/2015 which showed sinus rhythm with possible left atrial enlargement.  Recent Labwork: 09/12/2015: Hemoglobin 11.8; Platelets 307 09/21/2015: BUN 10; Creatinine, Ser 0.79; Potassium 4.0; Sodium 141  August 2017: Potassium 4.7, BUN 16, creatinine 0.9, AST 22, ALT 26, cholesterol 162, HDL 63, triglycerides 61, LDL 85  Other Studies Reviewed Today:  Chest x-ray 01/17/2015: FINDINGS: Cardiomediastinal silhouette is stable. No acute infiltrate or pleural effusion. No pulmonary edema. Mild degenerative  changes bilateral shoulders.  IMPRESSION: No active cardiopulmonary disease.  Assessment and Plan:  1. Patient reporting intense feeling of fatigue and dyspnea and exertion, mainly last month with some improvement recently. Intermittent atypical chest pain as well. ECG is abnormal but overall nonspecific at this point. Cardiac risk factors include hypertension, hyperlipidemia, and family history of CAD in both parents. She has not undergone any recent ischemic testing. We will arrange an echocardiogram as well as a [employer] (states that she cannot walk on a treadmill).  2. Bilateral leg pain, not classic for claudication necessarily, but dorsalis pedis pulses are somewhat diminished. We will obtain lower extremity arterial Dopplers and ABIs.  3. Essential hypertension, blood pressure is adequately controlled on present regimen including Hyzaar.  4. Hyperlipidemia, on Zocor with recent LDL 85.  Current medicines were reviewed with the patient today.   Orders Placed This Encounter  Procedures  . NM Myocar Multi W/Spect W/Wall Motion / EF  . US Arterial Seg Multiple  . [place] Lower Ext Art Bilat  . EKG 12-Lead  . ECHOCARDIOGRAM COMPLETE  Disposition: Call with test results.  Signed, Jonelle Sidle, MD, [facility] 05/28/2016 10:46 AM    [facility] at [facility] [address] [place], [place] [address] Phone: [phone]; Fax: [phone]

## 2016-05-28 ENCOUNTER — Encounter: Payer: Self-pay | Admitting: Cardiology

## 2016-05-28 ENCOUNTER — Ambulatory Visit (INDEPENDENT_AMBULATORY_CARE_PROVIDER_SITE_OTHER): Payer: Medicare Other | Admitting: Cardiology

## 2016-05-28 VITALS — BP 132/70 | HR 77 | Ht 64.0 in | Wt 162.0 lb

## 2016-05-28 DIAGNOSIS — R072 Precordial pain: Secondary | ICD-10-CM

## 2016-05-28 DIAGNOSIS — R5383 Other fatigue: Secondary | ICD-10-CM

## 2016-05-28 DIAGNOSIS — M79604 Pain in right leg: Secondary | ICD-10-CM | POA: Diagnosis not present

## 2016-05-28 DIAGNOSIS — I10 Essential (primary) hypertension: Secondary | ICD-10-CM | POA: Diagnosis not present

## 2016-05-28 DIAGNOSIS — E782 Mixed hyperlipidemia: Secondary | ICD-10-CM

## 2016-05-28 DIAGNOSIS — R0609 Other forms of dyspnea: Secondary | ICD-10-CM | POA: Diagnosis not present

## 2016-05-28 DIAGNOSIS — M79605 Pain in left leg: Secondary | ICD-10-CM

## 2016-05-28 DIAGNOSIS — R0600 Dyspnea, unspecified: Secondary | ICD-10-CM

## 2016-05-28 NOTE — Patient Instructions (Signed)
Your physician recommends that you schedule a follow-up appointment in: we will cll you with test rsults    Your physician has requested that you have an echocardiogram. Echocardiography is a painless test that uses sound waves to create images of your heart. It provides your doctor with information about the size and shape of your heart and how well your heart's chambers and valves are working. This procedure takes approximately one hour. There are no restrictions for this procedure.  Your physician has requested that you have a lexiscan myoview. For further information please visit [email or web address] Please follow instruction sheet, as given.   Your physician has requested that you have a lower extremity arterial duplex. This test is an ultrasound of the arteries in the legs. It looks at arterial blood flow in the legs. Allow one hour for Lower Arterial scans. There are no restrictions or special instructions    Your physician recommends that you continue on your current medications as directed. Please refer to the Current Medication list given to you today.          Thank you for choosing [facility] !

## 2016-06-06 ENCOUNTER — Ambulatory Visit (HOSPITAL_COMMUNITY)
Admission: RE | Admit: 2016-06-06 | Discharge: 2016-06-06 | Disposition: A | Discharge status: Home / Self Care | Payer: Medicare Other | Source: Ambulatory Visit | Attending: Cardiology | Admitting: Cardiology

## 2016-06-06 ENCOUNTER — Encounter (HOSPITAL_COMMUNITY)
Admission: RE | Admit: 2016-06-06 | Discharge: 2016-06-06 | Disposition: A | Discharge status: Home / Self Care | Payer: Medicare Other | Source: Ambulatory Visit | Attending: Cardiology | Admitting: Cardiology

## 2016-06-06 ENCOUNTER — Encounter (HOSPITAL_COMMUNITY): Payer: Self-pay

## 2016-06-06 ENCOUNTER — Ambulatory Visit (HOSPITAL_COMMUNITY): Admission: RE | Admit: 2016-06-06 | Payer: Medicare Other | Source: Ambulatory Visit

## 2016-06-06 DIAGNOSIS — R5383 Other fatigue: Secondary | ICD-10-CM

## 2016-06-06 DIAGNOSIS — I340 Nonrheumatic mitral (valve) insufficiency: Secondary | ICD-10-CM | POA: Diagnosis not present

## 2016-06-06 DIAGNOSIS — R0600 Dyspnea, unspecified: Secondary | ICD-10-CM

## 2016-06-06 DIAGNOSIS — R072 Precordial pain: Secondary | ICD-10-CM | POA: Insufficient documentation

## 2016-06-06 DIAGNOSIS — M79605 Pain in left leg: Secondary | ICD-10-CM | POA: Diagnosis present

## 2016-06-06 DIAGNOSIS — M79604 Pain in right leg: Secondary | ICD-10-CM | POA: Insufficient documentation

## 2016-06-06 DIAGNOSIS — R0609 Other forms of dyspnea: Secondary | ICD-10-CM | POA: Diagnosis not present

## 2016-06-06 DIAGNOSIS — I739 Peripheral vascular disease, unspecified: Secondary | ICD-10-CM | POA: Diagnosis not present

## 2016-06-06 LAB — NM MYOCAR MULTI W/SPECT W/WALL MOTION / EF
CHL CUP NUCLEAR SRS: 1
CHL CUP RESTING HR STRESS: 63 {beats}/min
CSEPPHR: 93 {beats}/min
LVDIAVOL: 54 mL (ref 46–106)
LVSYSVOL: 15 mL
RATE: 0.32
SDS: 0
SSS: 1
TID: 1.21

## 2016-06-06 MED ORDER — TECHNETIUM TC 99M TETROFOSMIN IV KIT
30.0000 | PACK | Freq: Once | INTRAVENOUS | Status: AC | PRN
  Administered 2016-06-06: 33 via INTRAVENOUS

## 2016-06-06 MED ORDER — REGADENOSON 0.4 MG/5ML IV SOLN
INTRAVENOUS | Status: AC
  Administered 2016-06-06: 0.4 mg via INTRAVENOUS
  Filled 2016-06-06: qty 5

## 2016-06-06 MED ORDER — TECHNETIUM TC 99M TETROFOSMIN IV KIT
10.0000 | PACK | Freq: Once | INTRAVENOUS | Status: AC | PRN
  Administered 2016-06-06: 9.5 via INTRAVENOUS

## 2016-06-06 MED ORDER — SODIUM CHLORIDE 0.9% FLUSH
INTRAVENOUS | Status: AC
  Administered 2016-06-06: 10:00:00 via INTRAVENOUS
  Filled 2016-06-06: qty 10

## 2016-06-06 NOTE — Progress Notes (Signed)
*  PRELIMINARY RESULTS* Echocardiogram 2D Echocardiogram has been performed.  Betty Golden 06/06/2016, 11:51 AM

## 2016-06-24 ENCOUNTER — Other Ambulatory Visit: Payer: Self-pay | Admitting: *Deleted

## 2016-06-24 MED ORDER — MOMETASONE FUROATE 50 MCG/ACT NA SUSP: 2.0000 | Freq: Every day | NASAL | 0 refills | Status: DC

## 2016-07-02 ENCOUNTER — Encounter (INDEPENDENT_AMBULATORY_CARE_PROVIDER_SITE_OTHER): Payer: Self-pay

## 2016-07-02 ENCOUNTER — Ambulatory Visit (INDEPENDENT_AMBULATORY_CARE_PROVIDER_SITE_OTHER): Payer: Medicare Other | Admitting: Allergy & Immunology

## 2016-07-02 ENCOUNTER — Encounter: Payer: Self-pay | Admitting: Allergy & Immunology

## 2016-07-02 VITALS — BP 124/72 | HR 72 | Temp 97.9°F | Resp 15 | Ht 63.58 in | Wt 161.4 lb

## 2016-07-02 DIAGNOSIS — J019 Acute sinusitis, unspecified: Secondary | ICD-10-CM

## 2016-07-02 DIAGNOSIS — J4540 Moderate persistent asthma, uncomplicated: Secondary | ICD-10-CM | POA: Diagnosis not present

## 2016-07-02 DIAGNOSIS — J3089 Other allergic rhinitis: Secondary | ICD-10-CM | POA: Diagnosis not present

## 2016-07-02 MED ORDER — AZELASTINE HCL 0.15 % NA SOLN: 2.0000 | Freq: Every day | NASAL | 5 refills | Status: DC

## 2016-07-02 MED ORDER — AMOXICILLIN-POT CLAVULANATE 875-125 MG PO TABS: 1.0000 | ORAL_TABLET | Freq: Two times a day (BID) | ORAL | 0 refills | Status: DC

## 2016-07-02 MED ORDER — MOMETASONE FUROATE 50 MCG/ACT NA SUSP: 2.0000 | Freq: Every day | NASAL | 0 refills | Status: DC

## 2016-07-02 MED ORDER — MONTELUKAST SODIUM 10 MG PO TABS: 10.0000 mg | ORAL_TABLET | Freq: Every day | ORAL | 5 refills | Status: DC

## 2016-07-02 MED ORDER — MOMETASONE FURO-FORMOTEROL FUM 200-5 MCG/ACT IN AERO: 2.0000 | INHALATION_SPRAY | Freq: Two times a day (BID) | RESPIRATORY_TRACT | 3 refills | Status: DC

## 2016-07-02 MED ORDER — FEXOFENADINE HCL 180 MG PO TABS: 180.0000 mg | ORAL_TABLET | Freq: Every day | ORAL | 5 refills | Status: DC

## 2016-07-02 NOTE — Progress Notes (Signed)
FOLLOW UP  Date of Service/Encounter:  07/02/16   Assessment:   Chronic nonseasonal allergic rhinitis due to other allergen  Moderate persistent asthma, uncomplicated  Acute sinusitis, recurrence not specified, unspecified location   Asthma Reportables:  Severity: moderate persistent  Risk: low Control: well controlled  Seasonal Influenza Vaccine: yes    Plan/Recommendations:   1. Chronic allergic rhinitis - not well controlled with the current therapy - Continue with Nasonex two sprays per nostril daily. - Continue with Singulair 10mg  daily. - Continue with cetirizine 10mg  daily. - Continue with Astepro 2 sprays per nostril 1-2 times daily. - I did discuss starting allergen immunotherapy, however she has been on shots in the past (at another practice) and did not feel that they worked. - She is not willing to attempt allergen allergy shots at this point.  - Previous testing is not available, but she does remember that dust mites was one of the allergens.  - Therefore, we will consider starting the dust mite daily tablet (we can discuss more at the next visit). - I did provide information on Odactra and will look into insurance coverage for this.  - Controlling the dust mite allergy might be provide enough coverage of her symptoms to decrease her incidences of sinusitis.   2. Moderate persistent asthma, uncomplicated - Continue Dulera for now, but use with a spacer every time. - Daily controller medication(s): Dulera 200/5 two puffs twice daily with spacer - Rescue medications: ProAir 4 puffs every 4-6 hours as needed - Asthma control goals:  * Full participation in all desired activities (may need albuterol before activity) * Albuterol use two time or less a week on average (not counting use with activity) * Cough interfering with sleep two time or less a month * Oral steroids no more than once a year * No hospitalizations  3. Acute sinusitis - Start the packet of  prednisone provided in clinic today. - Start Augmentin 875mg  one tablet twice daily for 14 days.  4. Return in about 3 months (around 09/30/2016).   Subjective:   Betty Golden is a 70 y.o. female presenting today for follow up of  Chief Complaint  Patient presents with  . Follow-up    doing good, this past weekend developed some sinus issues  .  11/30/2016 has a history of the following: Patient Active Problem List   Diagnosis Date Noted  . Spinal stenosis of lumbar region 09/20/2015  . 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 Moderate persistent asthma 07/03/2015  . Allergic rhinitis 07/03/2015  . Colitis due to Clostridium difficile 02/10/2015  . Abdominal pain 02/10/2015  . Nausea and vomiting 02/10/2015  . Acute renal failure (HCC) 02/10/2015  . Dehydration 02/10/2015  . Hypoxia 02/10/2015  . Hyponatremia 02/10/2015  . Hypokalemia 02/10/2015  . COPD (chronic obstructive pulmonary disease) (HCC)   . Unspecified constipation 07/15/2013  . DYSPNEA 04/05/2008  . Essential hypertension 03/10/2008  . Headache(784.0) 03/10/2008  . Cough 03/10/2008    History obtained from: chart review and patient.  03/12/2008 was referred by 03/12/2008, MD.     Betty Golden is a 70 y.o. female presenting for a follow up visit. She was last seen in June 2017 by Dr. Paul Dykes, who has since left the practice. At that time, she was started on Xyzal and encouraged to use nasal saline rinses 2-4 times per day. She was continued on Dulera 200/5 two puffs twice daily. She was given a sample of Dymista and then  transitioned back to Nasonex. She was also continued on Singulair 10mg  daily. She was given one dose of prednisone 30mg  in the clinic for sinusitis.   Since the last visit, she developed some sinus symptoms that have worsened acutely over the last 5-6 days. Symptoms started on Thursday and she has not done much at home for it. She is using her baseline sprays (Nasonex two sprays per nostril  every morning). She was on Astepro but she ran out of it. She does feel that it was working well. She does have nasal saline which she uses on a every day basis. She does endorse nose bleeds. Betty Golden estimates that she gets sinus 3-4 times per year. Otherwise her infectious history is unremarkable. She denies pneumonias, skin infections, bone infections, and other serious bacterial infections. Overall her sinus infections have improved. She reports that at one point she was getting steroid injections in her nose at [facility] (unknown how long this continued but it definitely went on for several years. She was on allergy shots for a period of time in [place], but she did not feel that they worked.   Otherwise, there have been no changes to her past medical history, surgical history, family history, or social history.    Review of Systems: a 14-point review of systems is pertinent for what is mentioned in HPI.  Otherwise, all other systems were negative. Constitutional: negative other than that listed in the HPI Eyes: negative other than that listed in the HPI Ears, nose, mouth, throat, and face: negative other than that listed in the HPI Respiratory: negative other than that listed in the HPI Cardiovascular: negative other than that listed in the HPI Gastrointestinal: negative other than that listed in the HPI Genitourinary: negative other than that listed in the HPI Integument: negative other than that listed in the HPI Hematologic: negative other than that listed in the HPI Musculoskeletal: negative other than that listed in the HPI Neurological: negative other than that listed in the HPI Allergy/Immunologic: negative other than that listed in the HPI    Objective:   Blood pressure 124/72, pulse 72, temperature 97.9 F (36.6 C), temperature source Oral, resp. rate 15, height 5' 3.58" (1.615 m), weight 161 lb 6.4 oz (73.2 kg), SpO2 96 %. Body mass index is 28.07 kg/m.   Physical  Exam:  General: Alert, interactive, in no acute distress. Cooperative with the exam. Smiling and affable female.  HEENT: TMs pearly gray, turbinates edematous and pale with thick discharge, post-pharynx erythematous. Neck: Supple without thyromegaly. Lungs: Clear to auscultation without wheezing, rhonchi or rales. No increased work of breathing. CV: Normal S1/S2, no murmurs. Capillary refill <2 seconds.  Abdomen: Nondistended, nontender. No guarding or rebound tenderness. Bowel sounds present in all fields and hyperactive  Skin: Warm and dry, without lesions or rashes. Extremities:  No clubbing, cyanosis or edema. Neuro:   Grossly intact.  Diagnostic studies:  Spirometry: normal FEV1, FVC, and FEV1/FVC ratio. There is no scooping suggestive of obstructive disease.   [facility], MD FAAAAI [facility]

## 2016-07-02 NOTE — Patient Instructions (Addendum)
1. 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 - Continue with Singulair 10mg  daily. - Continue with cetirizine 10mg  daily. - Continue with Astepro 2 sprays per nostril 1-2 times daily. - Consider starting the dust mite daily tablet (we can discuss more at the next visit).  2. Moderate persistent asthma, uncomplicated - Continue Dulera for now, but use with a spacer every time. - Daily controller medication(s): Dulera 200/5 two puffs twice daily with spacer - Rescue medications: ProAir 4 puffs every 4-6 hours as needed - Asthma control goals:  * Full participation in all desired activities (may need albuterol before activity) * Albuterol use two time or less a week on average (not counting use with activity) * Cough interfering with sleep two time or less a month * Oral steroids no more than once a year * No hospitalizations  3. Acute sinusitis - Start the packet of prednisone provided in clinic today. - Start Augmentin 875mg  one tablet twice daily for 14 days.  4. Return in about 3 months (around 09/30/2016).  Please inform of any Emergency Department visits, hospitalizations, or changes in symptoms. Call before going to the ED for breathing or allergy symptoms since we might be able to fit you in for a sick visit. Feel free to contact 11/30/2016 anytime with any questions, problems, or concerns.  It was a pleasure to meet you today! Have a wonderful holiday season!   Websites that have reliable patient information: 1. American Academy of Asthma, Allergy, and Immunology: www.aaaai.org 2. Food Allergy Research and Education (FARE): foodallergy.org 3. Mothers of Asthmatics: http://www.asthmacommunitynetwork.org 4. American College of Allergy, Asthma, and Immunology: www.acaai.org

## 2016-10-15 ENCOUNTER — Encounter: Payer: Self-pay | Admitting: Allergy & Immunology

## 2016-10-15 ENCOUNTER — Ambulatory Visit (INDEPENDENT_AMBULATORY_CARE_PROVIDER_SITE_OTHER): Payer: Medicare Other | Admitting: Allergy & Immunology

## 2016-10-15 VITALS — BP 130/90 | HR 68 | Temp 97.9°F

## 2016-10-15 DIAGNOSIS — J4540 Moderate persistent asthma, uncomplicated: Secondary | ICD-10-CM

## 2016-10-15 DIAGNOSIS — J3089 Other allergic rhinitis: Secondary | ICD-10-CM

## 2016-10-15 MED ORDER — AZELASTINE-FLUTICASONE 137-50 MCG/ACT NA SUSP: 2.0000 | Freq: Two times a day (BID) | NASAL | 3 refills | Status: DC

## 2016-10-15 MED ORDER — MOMETASONE FURO-FORMOTEROL FUM 200-5 MCG/ACT IN AERO: 2.0000 | INHALATION_SPRAY | Freq: Two times a day (BID) | RESPIRATORY_TRACT | 5 refills | Status: DC

## 2016-10-15 MED ORDER — ALBUTEROL SULFATE HFA 108 (90 BASE) MCG/ACT IN AERS: 4.0000 | INHALATION_SPRAY | RESPIRATORY_TRACT | 1 refills | Status: DC | PRN

## 2016-10-15 MED ORDER — MONTELUKAST SODIUM 10 MG PO TABS
10.0000 mg | ORAL_TABLET | Freq: Every day | ORAL | 3 refills | Status: DC
Start: 2016-10-15 — End: 2017-07-09

## 2016-10-15 NOTE — Patient Instructions (Addendum)
1. Moderate persistent asthma, uncomplicated - Lung testing looked great today. - We will not make any medication changes at this time. - Daily controller medication(s): Dulera 200/5 two inhalations twice daily with spacer + Singulair 10mg  daily - Rescue medications: ProAir 4 puffs every 4-6 hours as needed - Asthma control goals:  * Full participation in all desired activities (may need albuterol before activity) * Albuterol use two time or less a week on average (not counting use with activity) * Cough interfering with sleep two time or less a month * Oral steroids no more than once a year * No hospitalizations  2. Chronic allergic rhinitis - Stop Nasonex and Astelin and start Dymista two sprays per nostril twice daily. - Once the sample is used up, restart Nasonex and Astelin. - Prednisone pack provided. - Continue with Allegra 180mg  once daily. - Continue with Singulair 10mg  daily.   3. Return in about 6 months (around 04/17/2017).  Please inform of any Emergency Department visits, hospitalizations, or changes in symptoms. Call before going to the ED for breathing or allergy symptoms since we might be able to fit you in for a sick visit. Feel free to contact 04/19/2017 anytime with any questions, problems, or concerns.  It was a pleasure to see you again today! Happy spring!   Websites that have reliable patient information: 1. American Academy of Asthma, Allergy, and Immunology: www.aaaai.org 2. Food Allergy Research and Education (FARE): foodallergy.org 3. Mothers of Asthmatics: http://www.asthmacommunitynetwork.org 4. American College of Allergy, Asthma, and Immunology: www.acaai.org

## 2016-10-15 NOTE — Progress Notes (Signed)
FOLLOW UP  Date of Service/Encounter:  10/15/16   Assessment:   Moderate persistent asthma, uncomplicated  Chronic nonseasonal allergic rhinitis due to other allergen   Asthma Reportables:  Severity: moderate persistent  Risk: low Control: well controlled   Plan/Recommendations:   1. Moderate persistent asthma, uncomplicated - Lung testing looked great today. - We will not make any medication changes at this time. - Daily controller medication(s): Dulera 200/5 two inhalations twice daily with spacer + Singulair 10mg  daily - Rescue medications: ProAir 4 puffs every 4-6 hours as needed - Asthma control goals:  * Full participation in all desired activities (may need albuterol before activity) * Albuterol use two time or less a week on average (not counting use with activity) * Cough interfering with sleep two time or less a month * Oral steroids no more than once a year * No hospitalizations  2. Chronic allergic rhinitis - Stop Nasonex and Astelin and start Dymista two sprays per nostril twice daily. - Once the sample is used up, restart Nasonex and Astelin. - Prednisone pack provided. - Continue with Allegra 180mg  once daily. - Continue with Singulair 10mg  daily.   3. Return in about 6 months (around 04/17/2017).   Subjective:   Betty Golden is a 71 y.o. female presenting today for follow up of  Chief Complaint  Patient presents with  . Asthma  . Allergies    04/19/2017 has a history of the following: Patient Active Problem List   Diagnosis Date Noted  . Spinal stenosis of lumbar region 09/20/2015  . 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 Moderate persistent asthma 07/03/2015  . Allergic rhinitis 07/03/2015  . Colitis due to Clostridium difficile 02/10/2015  . Abdominal pain 02/10/2015  . Nausea and vomiting 02/10/2015  . Acute renal failure (HCC) 02/10/2015  . Dehydration 02/10/2015  . Hypoxia 02/10/2015  . Hyponatremia 02/10/2015  . Hypokalemia  02/10/2015  . COPD (chronic obstructive pulmonary disease) (HCC)   . Unspecified constipation 07/15/2013  . DYSPNEA 04/05/2008  . Essential hypertension 03/10/2008  . Headache(784.0) 03/10/2008  . Cough 03/10/2008    History obtained from: chart review and patient.  03/12/2008 was referred by 03/12/2008, MD.     Betty Golden is a 71 y.o. female presenting for a follow up visit. She was last seen in December 2017. At that time, her allergic rhinitis was not well controlled. She was continued on Nasonex 2 sprays per nostril daily, Singulair 10 mg daily, fexofenadine 180 mg daily, Astepro 2 sprays per nostril 1-2 times daily. Her prominent allergen has been dust mite and we briefly discussed starting dust mite immunotherapy at the last visit. Her asthma was well controlled at the last visit on Dulera 200/5 2 inhalations twice daily. She was also diagnosed with sinusitis and started on Augmentin for 2 weeks as well as a short prednisone pack.  Since last visit, she has mostly done well. She did well for the past six months. However she does endorse worsening allergy symptoms recently. She remains on Nasonex and Astelin. She uses these every day. She is on the Singulair and montelukast daily as well. This is typically her worst time of the year. She has been on allergy shots for "a while" but she did not feel that it helped. She was even on "nasal allergy shots" for a period of time without improvement.   Betty Golden's asthma has been well controlled. She has not required rescue medication, experienced nocturnal awakenings due to lower respiratory  symptoms, nor have activities of daily living been limited. She remains on Dulera 200/5 two puffs twice daily. She has been on Symbicort in the past but feels that the [facility] has been better for her. She has not required prednisone in quite some time for her breathing.   Otherwise, there have been no changes to her past medical history, surgical history,  family history, or social history. 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 She lives in [place], [place].    Review of Systems: a 14-point review of systems is pertinent for what is mentioned in HPI.  Otherwise, all other systems were negative. Constitutional: negative other than that listed in the HPI Eyes: negative other than that listed in the HPI Ears, nose, mouth, throat, and face: negative other than that listed in the HPI Respiratory: negative other than that listed in the HPI Cardiovascular: negative other than that listed in the HPI Gastrointestinal: negative other than that listed in the HPI Genitourinary: negative other than that listed in the HPI Integument: negative other than that listed in the HPI Hematologic: negative other than that listed in the HPI Musculoskeletal: negative other than that listed in the HPI Neurological: negative other than that listed in the HPI Allergy/Immunologic: negative other than that listed in the HPI    Objective:   Blood pressure 130/90, pulse 68, temperature 97.9 F (36.6 C), temperature source Oral, SpO2 98 %. There is no height or weight on file to calculate BMI.   Physical Exam:  General: Alert, interactive, in no acute distress. Cooperative with the exam.  Eyes: No conjunctival injection present on the right, No conjunctival injection present on the left, PERRL bilaterally, No discharge on the right, No discharge on the left and No Horner-Trantas dots present Ears: Right TM pearly gray with normal light reflex, Left TM pearly gray with normal light reflex, Right TM intact without perforation and Left TM intact without perforation.  Nose/Throat: External nose within normal limits, nasal crease present and septum midline, turbinates edematous and pale with clear discharge, post-pharynx erythematous with cobblestoning in the posterior oropharynx. Tonsils 2+ without exudates Neck: Supple without thyromegaly. Lungs: Clear  to auscultation without wheezing, rhonchi or rales. No increased work of breathing. CV: Normal S1/S2, no murmurs. Capillary refill <2 seconds.  Skin: Warm and dry, without lesions or rashes. Neuro:   Grossly intact. No focal deficits appreciated. Responsive to questions.   Diagnostic studies:  Spirometry: results normal (FEV1: 1.51/89%, FVC: 2.33/114%, FEV1/FVC: 64%).    Spirometry consistent with normal pattern   Allergy Studies: none      [name], MD [facility] [place]

## 2016-11-04 ENCOUNTER — Telehealth: Payer: Self-pay

## 2016-11-04 MED ORDER — FLUTICASONE FUROATE-VILANTEROL 200-25 MCG/INH IN AEPB: 1.0000 | INHALATION_SPRAY | Freq: Every day | RESPIRATORY_TRACT | 2 refills | Status: DC

## 2016-11-04 NOTE — Addendum Note (Signed)
Addended by: Marthann Schiller on: 11/04/2016 02:46 PM   Modules accepted: Orders

## 2016-11-04 NOTE — Telephone Encounter (Signed)
Received fax from [facility] on [address] [place] In regards to an alternative for [facility].  Alternatives are Advair or Breo   Please advise.

## 2016-11-04 NOTE — Telephone Encounter (Signed)
Script sent  

## 2016-11-04 NOTE — Telephone Encounter (Signed)
Ok to substitute Breo 200/25 one puff once daily.   [name], MD [facility] [place]

## 2016-11-06 ENCOUNTER — Telehealth: Payer: Self-pay

## 2016-11-06 NOTE — Telephone Encounter (Signed)
Let's substitute Breo 200/25 one inhalation once daily. If she is having worsening symptoms at the end of the day, then we will switch over the Advair.   [name], MD [facility] [place]

## 2016-11-06 NOTE — Telephone Encounter (Signed)
Pt's insurance does not cover Dulera. They prefer Advair or Breo. Please advise.

## 2016-11-06 NOTE — Telephone Encounter (Signed)
Left message to return call 

## 2016-11-07 NOTE — Telephone Encounter (Signed)
Spoke to patient and she understood.

## 2016-11-28 ENCOUNTER — Telehealth: Payer: Self-pay

## 2016-11-28 NOTE — Telephone Encounter (Signed)
Patient wanted to check and see the status of a check that she mailed on 11/19/2016. Please call her and let her know what is the status.  Thank you.

## 2016-11-28 NOTE — Telephone Encounter (Signed)
Advised pt that we received her $71 pmt on 11-20-16

## 2017-03-27 ENCOUNTER — Encounter (INDEPENDENT_AMBULATORY_CARE_PROVIDER_SITE_OTHER): Payer: Self-pay | Admitting: Internal Medicine

## 2017-03-27 ENCOUNTER — Encounter (INDEPENDENT_AMBULATORY_CARE_PROVIDER_SITE_OTHER): Payer: Self-pay

## 2017-04-04 IMAGING — DX DG ABDOMEN ACUTE W/ 1V CHEST
3 series · 3 of 3 positions shown · non-contrast
Comparison: Chest radiograph on 01/17/2015

CLINICAL DATA: Generalized abdominal pain, vomiting, and diarrhea
for 2 days.

EXAM:
DG ABDOMEN ACUTE W/ 1V CHEST

[chest pa]
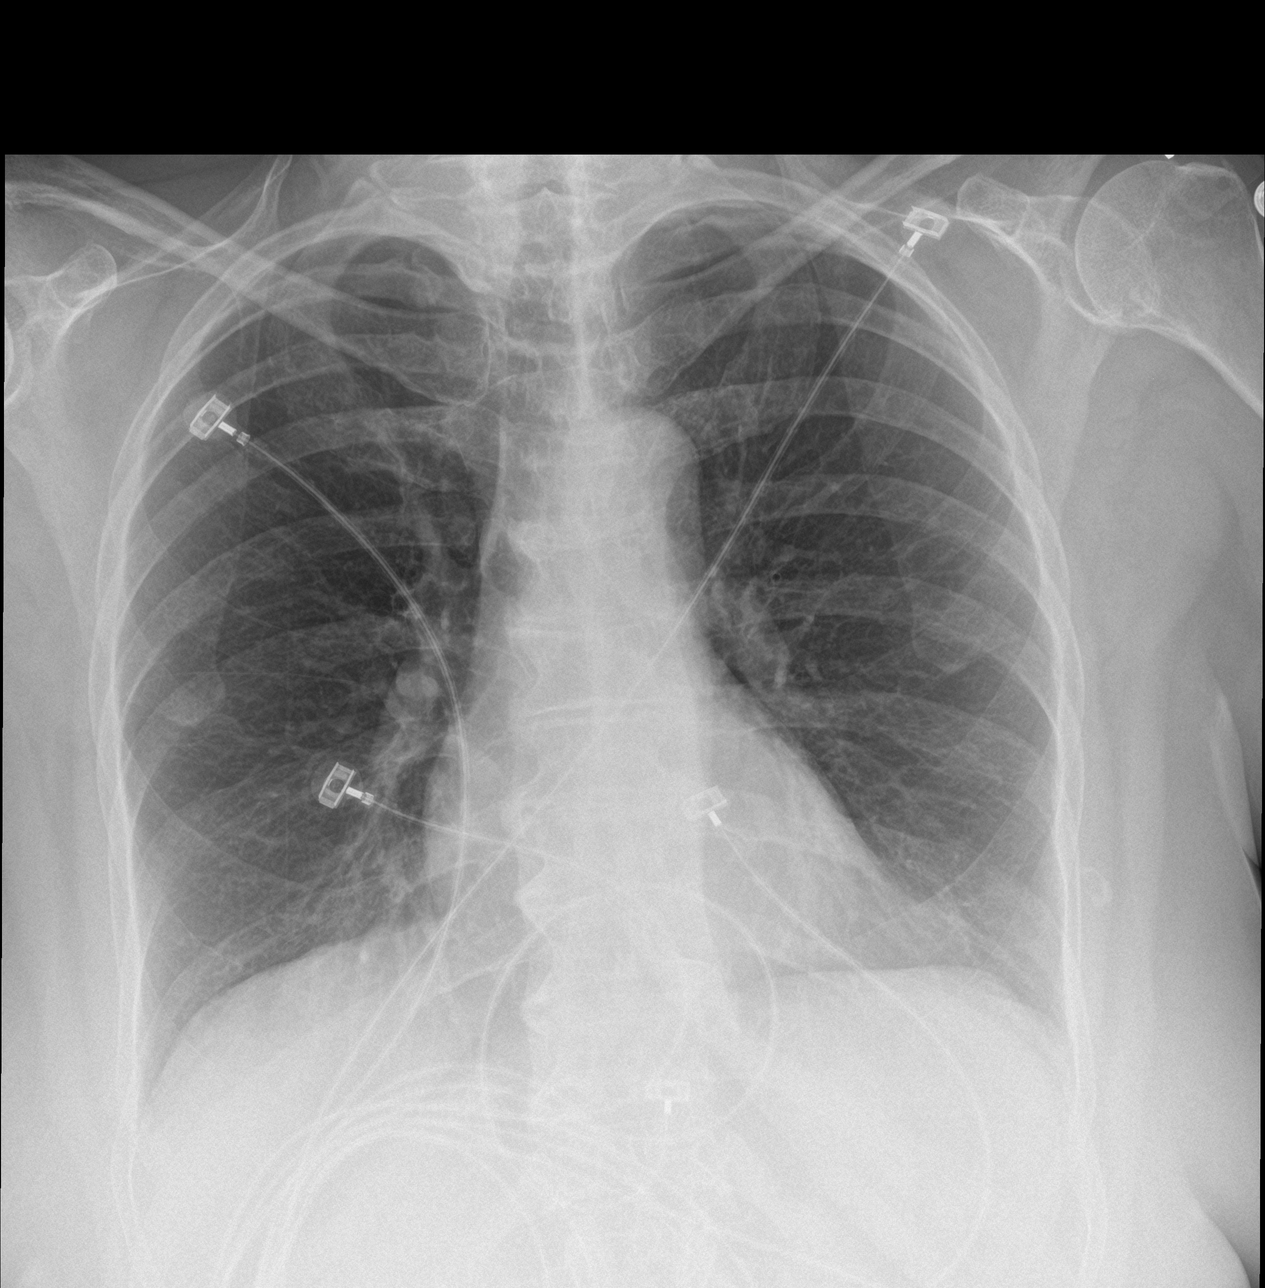

[abdomen erect]
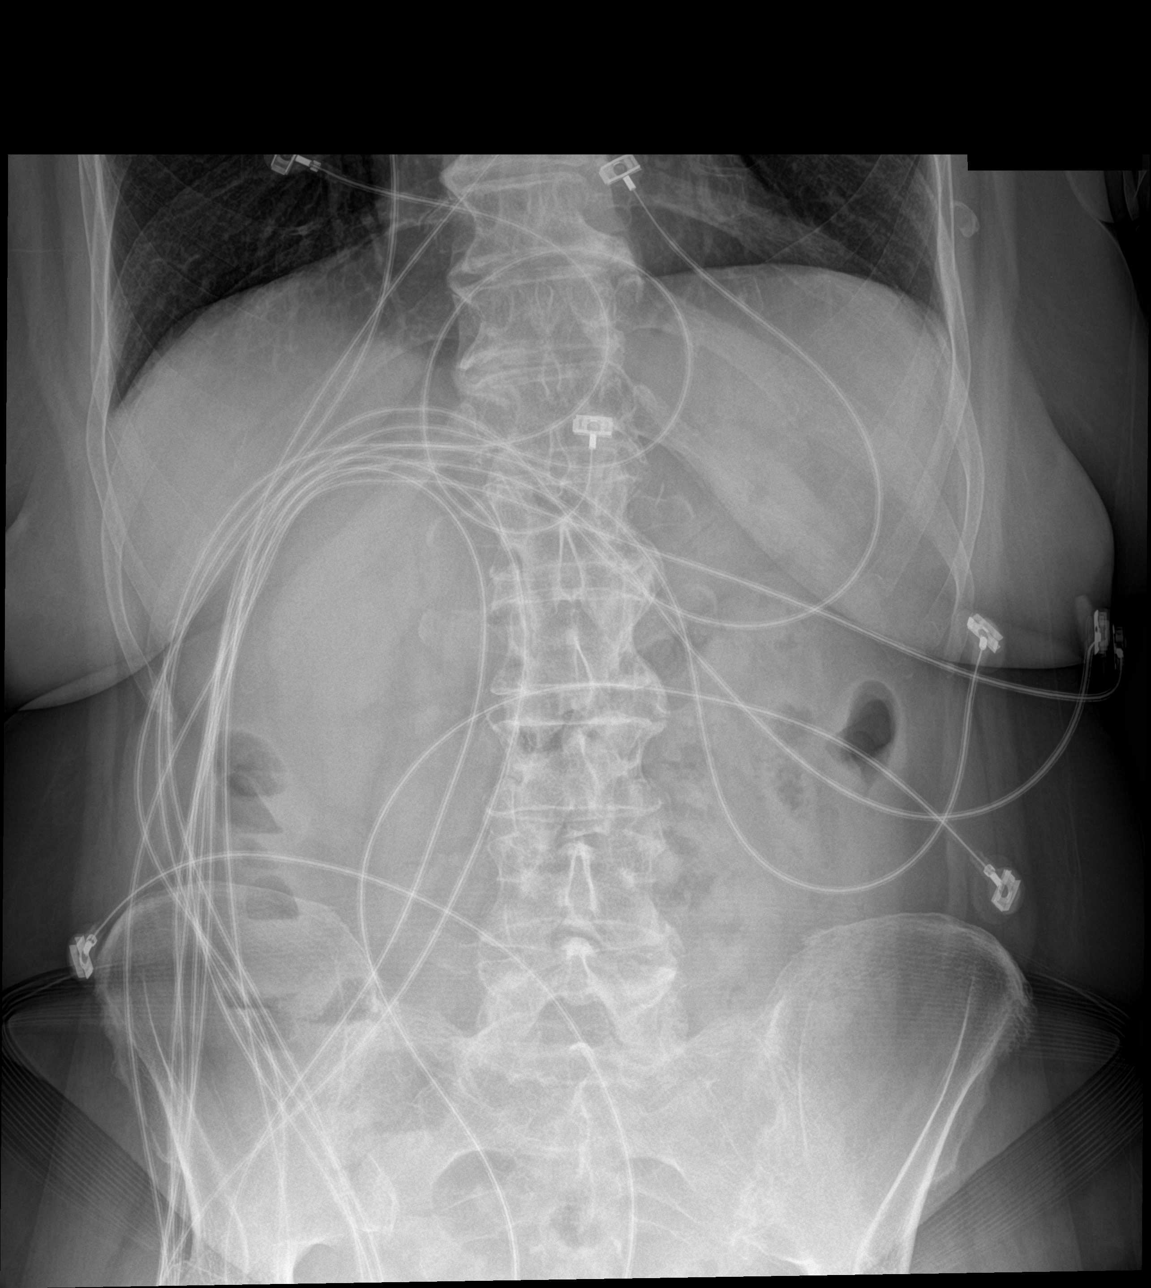

[abdomen supine]
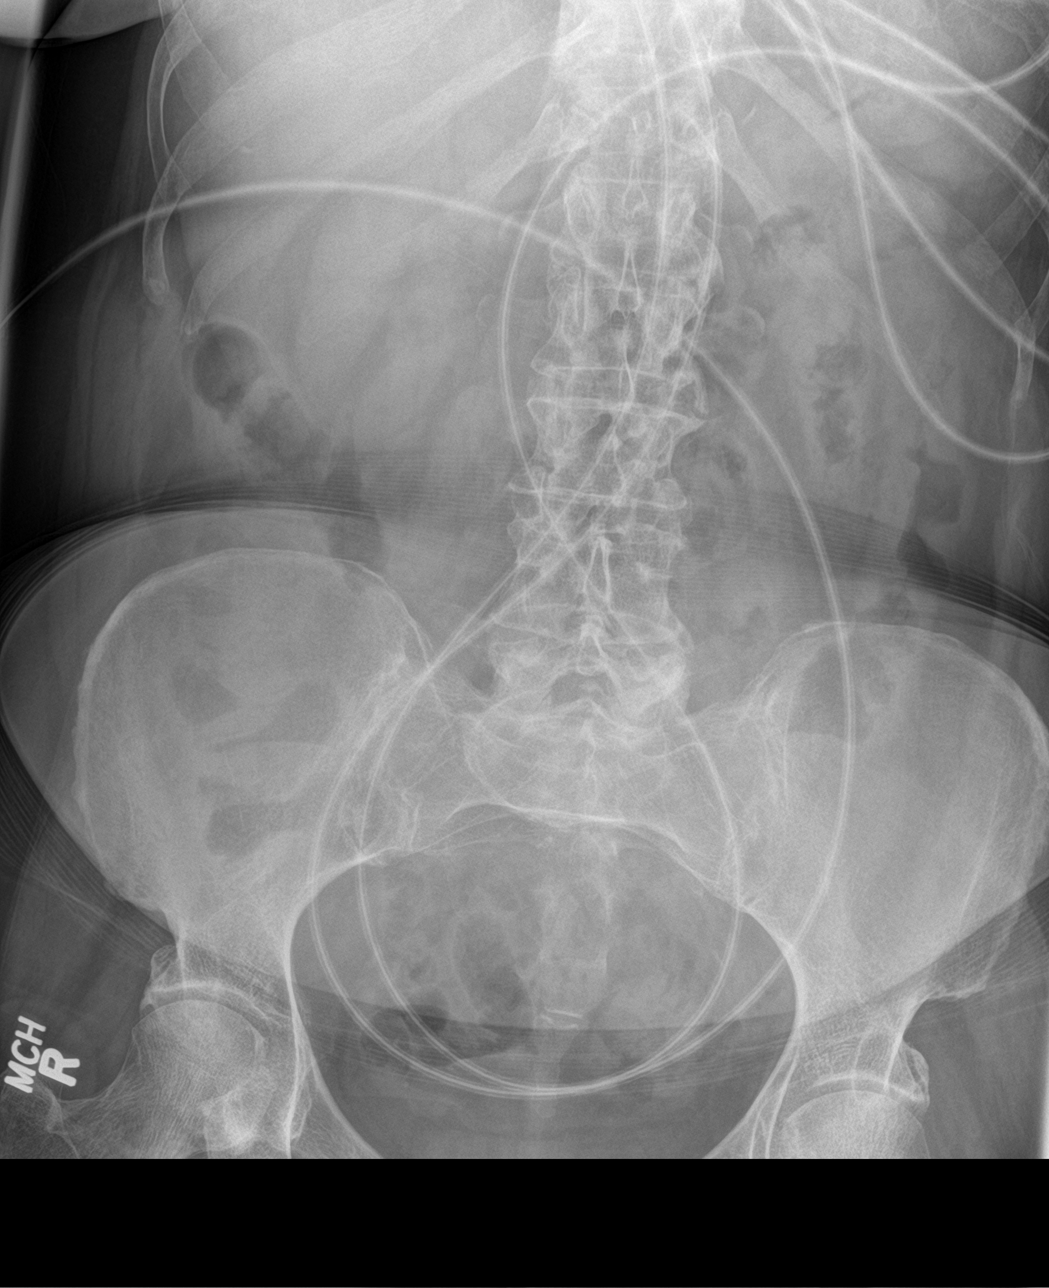

[3 of 3 positions shown; findings below may reference images not displayed]

FINDINGS: There is no evidence of dilated bowel loops or free intraperitoneal
air. No radiopaque calculi or other significant radiographic
abnormality is seen.

Heart size and mediastinal contours are within normal limits. Both
lungs are clear.
IMPRESSION: Unremarkable bowel gas pattern.

No active cardiopulmonary disease.

## 2017-04-15 ENCOUNTER — Ambulatory Visit: Payer: Medicare Other | Admitting: Allergy and Immunology

## 2017-04-29 ENCOUNTER — Encounter (INDEPENDENT_AMBULATORY_CARE_PROVIDER_SITE_OTHER): Payer: Self-pay | Admitting: Internal Medicine

## 2017-04-29 ENCOUNTER — Ambulatory Visit (INDEPENDENT_AMBULATORY_CARE_PROVIDER_SITE_OTHER): Payer: Medicare Other | Admitting: Internal Medicine

## 2017-04-29 VITALS — BP 162/70 | HR 60 | Temp 98.8°F | Ht 64.0 in | Wt 166.4 lb

## 2017-04-29 DIAGNOSIS — A0472 Enterocolitis due to Clostridium difficile, not specified as recurrent: Secondary | ICD-10-CM

## 2017-04-29 DIAGNOSIS — K625 Hemorrhage of anus and rectum: Secondary | ICD-10-CM | POA: Diagnosis not present

## 2017-04-29 LAB — CBC WITH DIFFERENTIAL/PLATELET
Basophils Absolute: 74 cells/uL (ref 0–200)
Basophils Relative: 0.7 %
EOS ABS: 148 {cells}/uL (ref 15–500)
Eosinophils Relative: 1.4 %
HEMATOCRIT: 35 % (ref 35.0–45.0)
Hemoglobin: 11.8 g/dL (ref 11.7–15.5)
Lymphs Abs: 4007 cells/uL — ABNORMAL HIGH (ref 850–3900)
MCH: 27.6 pg (ref 27.0–33.0)
MCHC: 33.7 g/dL (ref 32.0–36.0)
MCV: 81.8 fL (ref 80.0–100.0)
MPV: 12.7 fL — ABNORMAL HIGH (ref 7.5–12.5)
Monocytes Relative: 6.1 %
NEUTROS PCT: 54 %
Neutro Abs: 5724 cells/uL (ref 1500–7800)
Platelets: 300 10*3/uL (ref 140–400)
RBC: 4.28 10*6/uL (ref 3.80–5.10)
RDW: 13.5 % (ref 11.0–15.0)
Total Lymphocyte: 37.8 %
WBC: 10.6 10*3/uL (ref 3.8–10.8)
WBCMIX: 647 {cells}/uL (ref 200–950)

## 2017-04-29 NOTE — Progress Notes (Signed)
Subjective:    Patient ID: Betty Golden, female    DOB: 1946-06-09, 71 y.o.   MRN: [id number]  HPI Here today for f/u. Last seen in October of 2017.  Hx of C diff and Salmonella. Admitted to [facility] in 2016 with C diff. Covered with Vancomycin 125mg  QID x 14 days. At OV she was doing well.   She tells me she is doing good. She says she has been taking BC ((too much). She says she has been having some rectal bleeding. She will see the blood when she wipes. Rectal bleeding started about a year. She says she takes about 2 BC's powders a day.  Her appetite is good. No weight. Has a BM x 1 a day. No melena .     08/19/2013 Colonoscopy Indications: Patient is 71 year old African American female who is undergoing screening colonoscopy. Her last exam was 10 years ago. She does report having had hematochezia since her colonoscopy was scheduled.  Impression:  Examination performed to cecum. 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 Internal/external hemorrhoids.  Review of Systems Past Medical History:  Diagnosis Date  . Arthritis   . Asthma   . C. difficile colitis   . Chronic headaches   . Depression   . Diverticulosis   . Essential hypertension   . GERD (gastroesophageal reflux disease)   . History of Salmonella gastroenteritis   . HNP (herniated nucleus pulposus), lumbar   . Hyperlipidemia   . Lung nodule     Past Surgical History:  Procedure Laterality Date  . APPENDECTOMY    . BREAST REDUCTION SURGERY    . CHOLECYSTECTOMY    . COLONOSCOPY N/A 08/19/2013   Procedure: COLONOSCOPY;  Surgeon: 08/21/2013, MD;  Location: [facility] SUITE;  Service: Endoscopy;  Laterality: N/A;  155-moved to 140 Ann to notify pt  . LUMBAR LAMINECTOMY/DECOMPRESSION MICRODISCECTOMY Bilateral 09/20/2015   Procedure: MICRO LUMBAR DECOMPRESSION L5-S1 BILATERAL    (1 LEVEL);  Surgeon: 09/22/2015, MD;  Location: [facility] ORS;  Service: Orthopedics;  Laterality:  Bilateral;  . TONSILLECTOMY      Allergies  Allergen Reactions  . Sulfa Antibiotics Rash    Current Outpatient Prescriptions on File Prior to Visit  Medication Sig Dispense Refill  . acetaminophen (TYLENOL) 500 MG tablet Take 1 tablet (500 mg total) by mouth Golden 6 (six) hours as needed for mild pain. Do not exceed 4 grams of acetaminophen daily in combination with Norco  0  . Azelastine-Fluticasone 137-50 MCG/ACT SUSP Place 2 sprays into the nose 2 (two) times daily. 1 Bottle 3  . FLUoxetine (PROZAC) 20 MG capsule Take 20 mg by mouth daily.    . fluticasone furoate-vilanterol (BREO ELLIPTA) 200-25 MCG/INH AEPB Inhale 1 puff into the lungs daily. 60 each 2  . losartan-hydrochlorothiazide (HYZAAR) 100-12.5 MG per tablet Take 1 tablet by mouth daily.    . mometasone-formoterol (DULERA) 200-5 MCG/ACT AERO Inhale 2 puffs into the lungs 2 (two) times daily. 1 Inhaler 5  . montelukast (SINGULAIR) 10 MG tablet Take 1 tablet (10 mg total) by mouth at bedtime. 30 tablet 3  . simvastatin (ZOCOR) 20 MG tablet Take 20 mg by mouth at bedtime.      No current facility-administered medications on file prior to visit.         Objective:   Physical Exam Blood pressure (!) 162/70, pulse 60, temperature 98.8 F (37.1 C), height 5\' 4"  (1.626 m), weight 166 lb 6.4 oz (75.5 kg). Betty Golden  Alert and oriented. Skin warm and dry. Oral mucosa is moist.   . Sclera anicteric, conjunctivae is pink. Thyroid not enlarged. No cervical lymphadenopathy. Lungs clear. Heart regular rate and rhythm.  Abdomen is soft. Bowel sounds are positive. No hepatomegaly. No abdominal masses felt. No tenderness.  No edema to lower extremities.           Assessment & Plan:  C diff. Resolved. Rectal bleeding, questionable hemorrhoidal  . Colonoscopy UTD. She was advised to stop the [facility] powders. Am going to get a CBC today.

## 2017-04-29 NOTE — Patient Instructions (Signed)
CBC today.  

## 2017-05-05 ENCOUNTER — Telehealth (INDEPENDENT_AMBULATORY_CARE_PROVIDER_SITE_OTHER): Payer: Self-pay | Admitting: *Deleted

## 2017-05-05 NOTE — Telephone Encounter (Signed)
   Diagnosis:    Result(s)   Card 1: Negative: 05/02/2017    Card 2: Negative: 05/03/2017   Card 3: Negative: 05/04/2017    Completed by: Theadora Noyes,LPN   HEMOCCULT SENSA DEVELOPER: LOT#:  [id number] EXPIRATION DATE: 2020-10   HEMOCCULT SENSA CARD:  LOT#:  [id number] EXPIRATION DATE: 05/20   CARD CONTROL RESULTS:  POSITIVE: Positive NEGATIVE: Negative    ADDITIONAL COMMENTS: Results forwarded to Delrae Rend ordering provider.

## 2017-05-06 NOTE — Telephone Encounter (Signed)
Results given to patient

## 2017-05-20 ENCOUNTER — Encounter: Payer: Self-pay | Admitting: Allergy & Immunology

## 2017-05-20 ENCOUNTER — Ambulatory Visit (INDEPENDENT_AMBULATORY_CARE_PROVIDER_SITE_OTHER): Payer: Medicare Other | Admitting: Allergy & Immunology

## 2017-05-20 VITALS — BP 130/72 | HR 75 | Resp 18 | Ht 64.17 in | Wt 165.2 lb

## 2017-05-20 DIAGNOSIS — J4541 Moderate persistent asthma with (acute) exacerbation: Secondary | ICD-10-CM

## 2017-05-20 DIAGNOSIS — J019 Acute sinusitis, unspecified: Secondary | ICD-10-CM | POA: Diagnosis not present

## 2017-05-20 MED ORDER — AMOXICILLIN-POT CLAVULANATE 875-125 MG PO TABS: 1.0000 | ORAL_TABLET | Freq: Two times a day (BID) | ORAL | 0 refills | Status: AC

## 2017-05-20 NOTE — Progress Notes (Signed)
FOLLOW UP  Date of Service/Encounter:  05/20/17   Assessment:   Moderate persistent asthma with acute exacerbation - third prednisone course in less than 12 months  Acute sinusitis    Asthma Reportables:  Severity: moderate persistent  Risk: high Control: not well controlled   Plan/Recommendations:   1. Chronic nonseasonal allergic rhinitis - Continue with Nasonex two sprays per nostril daily. - Continue with Astelin two sprays per nostril.  - Continue with Singulair 10mg  daily. - Continue with cetirizine 10mg  daily. - Allergen immunotherapy discussed again, but she would like to hold off on this. - It should be noted that she would need new testing if we decided to pursue allergen immunotherapy (last testing was performed years ago at a practice in [place] ).  2. Moderate persistent asthma with acute exacerbation - Lung testing looked normal, but values were lower than previous testings. - She did improve with a DuoNeb treatment.  - We will not make any medication changes at this time, but we did discuss starting Fasenra as a means of controlling her elevated eosinophils.  - Mechanism of action discussed and forms filled out and signed. - Tammy will reach out to the patient to discuss further.  - Daily controller medication(s): Breo 200/25 one puff once daily - Rescue medications: ProAir 4 puffs every 4-6 hours as needed - Asthma control goals:  * Full participation in all desired activities (may need albuterol before activity) * Albuterol use two time or less a week on average (not counting use with activity) * Cough interfering with sleep two time or less a month * Oral steroids no more than once a year * No hospitalizations  3. Acute sinusitis - Start the packet of prednisone provided in clinic today. - Start Augmentin 875mg  one tablet twice daily for 14 days.   4. Return in about 6 months (around 11/18/2017).  Subjective:   Betty Golden is a 71 y.o.  female presenting today for follow up of  Chief Complaint  Patient presents with  . Nasal Congestion  . Sinusitis  . Cough    11/20/2017 has a history of the following: Patient Active Problem List   Diagnosis Date Noted  . Spinal stenosis of lumbar region 09/20/2015  . 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 Moderate persistent asthma 07/03/2015  . Allergic rhinitis 07/03/2015  . Colitis due to Clostridium difficile 02/10/2015  . Abdominal pain 02/10/2015  . Nausea and vomiting 02/10/2015  . Acute renal failure (HCC) 02/10/2015  . Dehydration 02/10/2015  . Hypoxia 02/10/2015  . Hyponatremia 02/10/2015  . Hypokalemia 02/10/2015  . COPD (chronic obstructive pulmonary disease) (HCC)   . Unspecified constipation 07/15/2013  . DYSPNEA 04/05/2008  . Essential hypertension 03/10/2008  . Headache(784.0) 03/10/2008  . Cough 03/10/2008    History obtained from: chart review and patient.  03/12/2008 [place]'s Primary Care Provider is 03/12/2008, MD.     Betty Golden is a 71 y.o. female presenting for a follow up visit. She was last seen in March 2018. At that time, her lung testing looked great. We did not make any changes because she was doing so well. We maintained her on Dulera 200/5 two puffs BID as well as Singulair. We stopped her Nasonex and Astelin and started her on Dymista two sprays per nostril daily. We also started her on a prednisone pack and continued her on Allegra one tablet daily.   Since the last visit, she has not done well. At some point,  she was changed from [facility] to [facility] 200/25 one puff once daily. This seems to be working well. Review of the meds show that this was changed in April 2018. She feels that this is working. She did use her ProAir for a few weeks now since she is out of Breo. She is waiting for this appointment before getting a refill on her Breo. She has had a constant cough for two weeks. She has had problems especially at night. She has tried Mucinex and  Robitussin without improvement. She does endorse postnasal drip. She thinks that she has a sinus infection.   Of note, this would be the third course of prednisone in less than 12 months (Dec 2017, March 2018, and present).  She remains on her Nasonex and Astelin. She is also on her cetirizine. She does not use the nasal sprays consistently. She is still not interested in pursuing allergen immunotherapy.   Otherwise, there have been no changes to her past medical history, surgical history, family history, or social history.    Review of Systems: a 14-point review of systems is pertinent for what is mentioned in HPI.  Otherwise, all other systems were negative. Constitutional: negative other than that listed in the HPI Eyes: negative other than that listed in the HPI Ears, nose, mouth, throat, and face: negative other than that listed in the HPI Respiratory: negative other than that listed in the HPI Cardiovascular: negative other than that listed in the HPI Gastrointestinal: negative other than that listed in the HPI Genitourinary: negative other than that listed in the HPI Integument: negative other than that listed in the HPI Hematologic: negative other than that listed in the HPI Musculoskeletal: negative other than that listed in the HPI Neurological: negative other than that listed in the HPI Allergy/Immunologic: negative other than that listed in the HPI    Objective:   Blood pressure 130/72, pulse 75, resp. rate 18, height 5' 4.17" (1.63 m), weight 165 lb 3.2 oz (74.9 kg), SpO2 95 %. Body mass index is 28.2 kg/m.   Physical Exam:  General: Alert, interactive, in no acute distress. Pleasant female. Interactive.  Eyes: No conjunctival injection bilaterally, no discharge on the right, no discharge on the left and no Horner-Trantas dots present. PERRL bilaterally. EOMI without pain. No photophobia.  Ears: Right TM pearly gray with normal light reflex, Left TM pearly gray with  normal light reflex, Right TM intact without perforation and Left TM intact without perforation.  Nose/Throat: External nose within normal limits and septum midline. Turbinates edematous and pale with clear discharge. Posterior oropharynx erythematous with cobblestoning in the posterior oropharynx. Tonsils 2+ without exudates.  Tongue without thrush. Adenopathy: shoddy bilateral anterior cervical lymphadenopathy Lungs: Decreased breath sounds bilaterally without wheezing, rhonchi or rales. No increased work of breathing. CV: Normal S1/S2. No murmurs. Capillary refill <2 seconds.  Skin: Warm and dry, without lesions or rashes. Neuro:   Grossly intact. No focal deficits appreciated. Responsive to questions.  Diagnostic studies:   Spirometry: results normal (FEV1: 1.36/76%, FVC: 1.74/81%, FEV1/FVC: 77%).    Spirometry consistent with normal pattern. Albuterol/Atrovent nebulizer treatment given in clinic with significant improvement in FEV1 and FVC per ATS criteria. The FVC increased 54% and the FEV1 increased 18%.   Allergy Studies: none      [name], MD [facility] [place]

## 2017-05-20 NOTE — Patient Instructions (Addendum)
1. Chronic nonseasonal allergic rhinitis - Continue with Nasonex two sprays per nostril daily. - Continue with Astelin two sprays per nostril.  - Continue with Singulair 10mg  daily. - Continue with cetirizine 10mg  daily.  2. Moderate persistent asthma, uncomplicated - Lung testing looked normal. - We will not make any medication changes at this time.  - Daily controller medication(s): Breo 200/25 one puff once daily - Rescue medications: ProAir 4 puffs every 4-6 hours as needed - Asthma control goals:  * Full participation in all desired activities (may need albuterol before activity) * Albuterol use two time or less a week on average (not counting use with activity) * Cough interfering with sleep two time or less a month * Oral steroids no more than once a year * No hospitalizations  3. Acute sinusitis - Start the packet of prednisone provided in clinic today. - Start Augmentin 875mg  one tablet twice daily for 14 days.   4. Return in about 6 months (around 11/18/2017).   Please inform of any Emergency Department visits, hospitalizations, or changes in symptoms. Call before going to the ED for breathing or allergy symptoms since we might be able to fit you in for a sick visit. Feel free to contact 11/20/2017 anytime with any questions, problems, or concerns.  It was a pleasure to see you again today! Enjoy the fall season!  Websites that have reliable patient information: 1. American Academy of Asthma, Allergy, and Immunology: www.aaaai.org 2. Food Allergy Research and Education (FARE): foodallergy.org 3. Mothers of Asthmatics: http://www.asthmacommunitynetwork.org 4. American College of Allergy, Asthma, and Immunology: www.acaai.org   Election Day is coming up on Tuesday, November 6th! Although it is too late to register to vote by mail, you can still register up to November 5th at any of the early voting locations. Try to early vote in case there are problems with your registration!    If you are turned away at the polls, you have the right to request a provisional ballot, which is required by law!      [place] [facility] (open 8am - 5pm) First Floor [address], [place]   02-13-2004 (open 8am - 5pm) [address] [place] [address] (open 7am - 7pm)  [address], [place] (open 7am - 7pm) [address], [place] (open 7am - 7pm) [address], [employer] (open 7am - 7pm) [address], [place]   [facility] (open 7am - 7pm) [address], [place]   [employer] (open 7am - 7pm) [address], [employer] (open 7am - 7pm) [address], [place]  \

## 2017-06-06 ENCOUNTER — Other Ambulatory Visit: Payer: Self-pay | Admitting: Specialist

## 2017-06-06 DIAGNOSIS — Z9889 Other specified postprocedural states: Secondary | ICD-10-CM

## 2017-06-09 ENCOUNTER — Ambulatory Visit
Admission: RE | Admit: 2017-06-09 | Discharge: 2017-06-09 | Disposition: A | Discharge status: Home / Self Care | Payer: Medicare Other | Source: Ambulatory Visit | Attending: Specialist | Admitting: Specialist

## 2017-06-09 DIAGNOSIS — Z9889 Other specified postprocedural states: Secondary | ICD-10-CM

## 2017-06-09 MED ORDER — GADOBENATE DIMEGLUMINE 529 MG/ML IV SOLN: 15.0000 mL | Freq: Once | INTRAVENOUS | Status: DC | PRN

## 2017-06-10 ENCOUNTER — Other Ambulatory Visit: Payer: Medicare Other

## 2017-07-08 ENCOUNTER — Ambulatory Visit: Payer: Medicare Other

## 2017-07-09 ENCOUNTER — Other Ambulatory Visit: Payer: Self-pay | Admitting: Allergy & Immunology

## 2017-07-15 ENCOUNTER — Ambulatory Visit (INDEPENDENT_AMBULATORY_CARE_PROVIDER_SITE_OTHER): Payer: Medicare Other

## 2017-07-15 DIAGNOSIS — J4550 Severe persistent asthma, uncomplicated: Secondary | ICD-10-CM | POA: Diagnosis not present

## 2017-07-15 MED ORDER — BENRALIZUMAB 30 MG/ML ~~LOC~~ SOSY
30.0000 mg | PREFILLED_SYRINGE | SUBCUTANEOUS | Status: DC
  Administered 2017-07-15 – 2018-06-17 (×8): 30 mg via SUBCUTANEOUS

## 2017-07-15 NOTE — Progress Notes (Signed)
Immunotherapy   Patient Details  Name: Betty Golden MRN: [id number] Date of Birth: 1945/10/22  07/15/2017  Betty Golden started injections for  Harrington Challenger Every 4 weeks for 3 months then every 8 weeks  Epi-Pen:Epi-Pen Available  Consent signed and patient instructions given.   Riley Lam 07/15/2017, 12:13 PM

## 2017-08-12 ENCOUNTER — Ambulatory Visit (INDEPENDENT_AMBULATORY_CARE_PROVIDER_SITE_OTHER): Payer: Medicare Other

## 2017-08-12 DIAGNOSIS — J4550 Severe persistent asthma, uncomplicated: Secondary | ICD-10-CM

## 2017-09-08 ENCOUNTER — Encounter: Payer: Self-pay | Admitting: Neurology

## 2017-09-08 ENCOUNTER — Ambulatory Visit: Payer: Medicare Other | Admitting: Neurology

## 2017-09-08 VITALS — BP 127/68 | HR 74 | Ht 64.0 in | Wt 166.0 lb

## 2017-09-08 DIAGNOSIS — R269 Unspecified abnormalities of gait and mobility: Secondary | ICD-10-CM

## 2017-09-08 DIAGNOSIS — W19XXXA Unspecified fall, initial encounter: Secondary | ICD-10-CM

## 2017-09-08 DIAGNOSIS — M797 Fibromyalgia: Secondary | ICD-10-CM | POA: Insufficient documentation

## 2017-09-08 DIAGNOSIS — M6281 Muscle weakness (generalized): Secondary | ICD-10-CM

## 2017-09-08 DIAGNOSIS — R05 Cough: Secondary | ICD-10-CM

## 2017-09-08 DIAGNOSIS — M7918 Myalgia, other site: Secondary | ICD-10-CM | POA: Diagnosis not present

## 2017-09-08 DIAGNOSIS — R531 Weakness: Secondary | ICD-10-CM

## 2017-09-08 DIAGNOSIS — R2689 Other abnormalities of gait and mobility: Secondary | ICD-10-CM

## 2017-09-08 DIAGNOSIS — R29898 Other symptoms and signs involving the musculoskeletal system: Secondary | ICD-10-CM

## 2017-09-08 DIAGNOSIS — R059 Cough, unspecified: Secondary | ICD-10-CM

## 2017-09-08 NOTE — Patient Instructions (Addendum)
MRI brain  Labs today EMG/NCS  Your physician has ordered a Nerve Conduction Study (NCV) and/or EMG testing.  This is a test to assess the status of your nerves and muscles. For the NCV portion of the test , sticky tabs will be placed on either your hands or feet.  Your nerves will be stimulated using small electrical charges and the speed of the impulse will be measured as it travels down the nerve. For the EMG portion of the test, a small pin will be placed below the surface of the skin to measure the electrical activity in certain muscles in your arms or legs. Eating/drinking prior to testing is ok.  Please DO NOT discontinue ANY medications prior to testing  Your appointment will be scheduled by a member of our team.  You will need to check in with the main reception desk. Your test could take approximately 30 minutes to 1 hour to complete depending on the extent of the test that has been ordered. TESTING ON LEGS/BACK/HIP/FOOT AREA: Bring/wear a pair of shorts.  **ABSOLUTELY NO LOTIONS, MOISTURIZERS, VASELINE, OILS OR CREAMS OF ANY KIND ON ANY BODY PART THE DAY OF THE TEST**  **DEODORANT IS OK TO WEAR**  Please make every effort to keep your scheduled appointment time, as your physician needs the test results prior to your next appointment.  If you have to cancel or reschedule, please do so as soon as possible, so that another patient may use that testing time slot.  If you cancel your appointment, you may also need to reschedule your referring doctor's appointment until the test and results can be completed.  Please call [phone] and ask for Dr. Corning Blas assistant if you need to do so.  If you have any questions at any time please let us know.  We look forward to working with you!!

## 2017-09-08 NOTE — Progress Notes (Addendum)
[facility]    Provider:  Dr Jaynee Eagles Referring Provider: Dr. Suella Broad Primary Care Physician:  Quentin Cornwall, MD  CC:  Weakness and pain  HPI:  Betty Golden is a 72 y.o. female here as a referral from Dr. Nelva Bush for cervical stenosis, chronic pain, weakness causing significant functional impairment that cannot be explained solely from the MRI of the cervical spine and lumbar spine, IBS, HLD, Fibromomyalgia, HTN, depression, Rheumatoid Arthritis(per patient).  Past medical history cervical spinal stenosis, degenerative lumbar disc, sacroiliac joint disease, cervical radiculitis, lumbar spine pain status post lumbar microdiscectomy. Since 2014 she has been in a lot of pain. When she walks, her legs buckle and "let go". She has to hold onto the walls. Always the right side and now the left. Progressive. She also has pain in the proximal arms as well but not as bad as the legs. The pain in her legs is mostly in the upper thighs, front and back and in the lower spine . Very sensitive. Night is worse. She can;t explain the pain, she just says severe pain. She has foot arch pain. No inciting events, no falls, onset was acute. Nothing makes it better, heat and stretching doesn't help, steroid shots don;t help. She has a lot of arthritis. She has sharp pains in her forehead. No bowel or bladder changes. Not losing weight. No muscle wasting. No facial weakness. No dysphagia. No tremors.No other focal neurologic deficits, associated symptoms, inciting events or modifiable factors.  Reviewed notes, labs and imaging from outside physicians, which showed:   Reviewed referring physician's notes.  She is a 72 year old patient who was seeing Dr. Herma Mering for neck pain.  She has right-sided neck pain.  Pain, aching and throbbing.  Feels that they are doing poorly in the meds are not working and reports her pain level to be a 10 out of 10.  The patient came in following MRI.  She is  significantly more limited from her functional activities and I can explain based on her lumbar as well as her cervical MRI.  She has moderate central canal stenosis.  She is seeing Dr. Rolena Infante for this.  Not convinced she is myelopathic.  Not convinced that this is causing all of her symptoms.  They also referred her to rheumatology.  She was sent over here for evaluation of weakness.  No myelopathic features.  Ongoing for 4 years.   CT head 05/2010 showed no acute intracranial abnormalities including mass lesion or mass effect, hydrocephalus, extra-axial fluid collection, midline shift, hemorrhage, or acute infarction, large ischemic events (personally reviewed images)   Reviewed reports of MRI lumbar spine and cervical spine:  MRI L-spine: 1. L5-S1 chronic circumferential disc bulge and endplate spurring with superimposed severe chronic facet arthropathy and postoperative changes to the bilateral posterior elements. Moderate multifactorial left L4 foraminal stenosis in part related to endplate spurring. No convincing spinal, lateral recess, or right foraminal stenosis. 2. Limited lumbar disc and endplate degeneration elsewhere, mostly L3-L4. Widespread lumbar facet arthropathy, mostly moderate. No other lumbar spinal or lateral recess stenosis. 3. Multifactorial mild neural foraminal stenosis at the left L1 and left greater than right L3 nerve levels.  MRI of the cervical spine report impression: Congenitally narrow spinal canal, C4-C5 mild posterior discovertebral osteophytosis and congenitally narrowed spinal canal produce moderate central canal stenosis without mass-effect on the cord.  Severe left neural foraminal stenosis due to uncovertebral arthrosis.  C3-C4 moderate central canal stenosis on a congenital basis without mass-effect  in the cord.  Right uncovertebral arthrosis.  C5-C6 small central posterior disc protrusion and congenitally narrowed spinal canal resulting in mild to  moderate central canal stenosis.  C7-T1 left facet arthrosis.  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  Bulky maxillary fluid compatible with sinusitis.    Review of Systems: Patient complains of symptoms per HPI as well as the following symptoms: weakness, muscle pain, joint pain, imbalance, fall risk. Pertinent negatives and positives per HPI. All others negative.   Social History   Socioeconomic History  . Marital status: Single    Spouse name: Not on file  . Number of children: 1  . Years of education: Not on file  . Highest education level: GED or equivalent  Social Needs  . Financial resource strain: Not on file  . Food insecurity - worry: Not on file  . Food insecurity - inability: Not on file  . Transportation needs - medical: Not on file  . Transportation needs - non-medical: Not on file  Occupational History  . Not on file  Tobacco Use  . Smoking status: Former Smoker    Packs/day: 2.00    Years: 10.00    Pack years: 20.00    Types: Cigarettes    Last attempt to quit: 09/11/1988    Years since quitting: 29.0  . Smokeless tobacco: Never Used  . Tobacco comment: quit 1990  Substance and Sexual Activity  . Alcohol use: No    Comment: stopped in 1990  . Drug use: No    Comment: hx smoking marijuana and cocaine weekends. Stopped in the 1990s.  . Sexual activity: Not on file  Other Topics Concern  . Not on file  Social History Narrative   Lives at home with her daughter & daughter's family (they live with her)   Right handed   Drinks no caffeine    Family History  Problem Relation Age of Onset  . Heart disease Mother   . Hypertension Mother   . Asthma Mother   . Heart disease Father   . Stroke Father   . Hypertension Father   . Stroke Sister   . Asthma Sister   . Hypertension Brother   . Asthma Brother   . Asthma Maternal Grandmother   . Heart disease Maternal Grandmother   . Diabetes Maternal Grandfather    . Colon cancer Neg Hx     Past Medical History:  Diagnosis Date  . Arthritis   . Asthma   . C. difficile colitis   . Cervical stenosis of spine   . Chronic headaches   . Chronic neck pain   . Depression   . Diverticulosis   . Essential hypertension   . Fibromyalgia   . GERD (gastroesophageal reflux disease)   . History of Salmonella gastroenteritis   . HNP (herniated nucleus pulposus), lumbar   . Hyperlipidemia   . IBS (irritable bowel syndrome)   . Lung nodule     Past Surgical History:  Procedure Laterality Date  . APPENDECTOMY  1965  . BREAST BIOPSY    . BREAST REDUCTION SURGERY  1994  . CHOLECYSTECTOMY  1975  . COLONOSCOPY N/A 08/19/2013   Procedure: COLONOSCOPY;  Surgeon: Rogene Houston, MD;  Location: [facility];  Service: Endoscopy;  Laterality: N/A;  155-moved to 140 Ann to notify pt  . LUMBAR LAMINECTOMY/DECOMPRESSION MICRODISCECTOMY Bilateral 09/20/2015   Procedure: MICRO LUMBAR DECOMPRESSION L5-S1 BILATERAL    (1 LEVEL);  Surgeon: Susa Day,  MD;  Location: [facility];  Service: Orthopedics;  Laterality: Bilateral;  . Sinus Surgery    . TONSILLECTOMY  1968    Current Outpatient Medications  Medication Sig Dispense Refill  . albuterol (PROAIR HFA) 108 (90 Base) MCG/ACT inhaler Inhale 1 puff into the lungs every 4 (four) hours as needed for wheezing or shortness of breath.    . Azelastine-Fluticasone 137-50 MCG/ACT SUSP Place 2 sprays into the nose 2 (two) times daily. 1 Bottle 3  . DULoxetine (CYMBALTA) 20 MG capsule Take 20 mg by mouth 2 (two) times daily.    . fluticasone furoate-vilanterol (BREO ELLIPTA) 200-25 MCG/INH AEPB Inhale 1 puff into the lungs daily. 60 each 2  . folic acid (FOLVITE) 1 MG tablet Take 1 mg by mouth daily.    Marland Kitchen gabapentin (NEURONTIN) 100 MG capsule Take 200 mg by mouth 2 (two) times daily.     Marland Kitchen losartan-hydrochlorothiazide (HYZAAR) 100-12.5 MG per tablet Take 1 tablet by mouth daily.    . methotrexate 2.5 MG tablet Take 10 mg by  mouth once a week. For 30 days    . montelukast (SINGULAIR) 10 MG tablet TAKE 1 TABLET (10 MG TOTAL) BY MOUTH AT BEDTIME. 30 tablet 3  . simvastatin (ZOCOR) 20 MG tablet Take 20 mg by mouth at bedtime.     Marland Kitchen acetaminophen (TYLENOL) 500 MG tablet Take 1 tablet (500 mg total) by mouth every 6 (six) hours as needed for mild pain. Do not exceed 4 grams of acetaminophen daily in combination with Norco (Patient not taking: Reported on 09/08/2017)  0   Current Facility-Administered Medications  Medication Dose Route Frequency Provider Last Rate Last Dose  . Benralizumab SOSY 30 mg  30 mg Subcutaneous Q28 days Valentina Shaggy, MD   30 mg at 08/12/17 1001    Allergies as of 09/08/2017 - Review Complete 09/08/2017  Allergen Reaction Noted  . Sulfa antibiotics Rash 02/10/2015    Vitals: BP 127/68 (BP Location: Right Arm, Patient Position: Sitting)   Pulse 74   Ht '5\' 4"'$  (1.626 m)   Wt 166 lb (75.3 kg)   BMI 28.49 kg/m  Last Weight:  Wt Readings from Last 1 Encounters:  09/08/17 166 lb (75.3 kg)   Last Height:   Ht Readings from Last 1 Encounters:  09/08/17 '5\' 4"'$  (1.626 m)    Physical exam: Exam: Gen: NAD, conversant, well nourised, well groomed                     CV: RRR, no MRG. No Carotid Bruits. No peripheral edema, warm, nontender Eyes: Conjunctivae clear without exudates or hemorrhage  Neuro: Detailed Neurologic Exam  Speech:    Speech is normal; fluent and spontaneous with normal comprehension.  Cognition:    The patient is oriented to person, place, and time;     recent and remote memory intact;     language fluent;     normal attention, concentration,     fund of knowledge Cranial Nerves:    The pupils are equal, round, and reactive to light. The fundi are normal and spontaneous venous pulsations are present. Visual fields are full to finger confrontation. Extraocular movements are intact. Trigeminal sensation is intact and the muscles of mastication are normal. The  face is symmetric. The palate elevates in the midline. Hearing impaired. Voice is normal. Shoulder shrug is normal. The tongue has normal motion without fasciculations.   Coordination:    Normal finger to nose.   Gait:antalgic  Motor Observation:    No asymmetry, no atrophy, and no involuntary movements noted. Tone:    Normal muscle tone.    Posture:    Posture is normal. normal erect    Strength: Poor effort and giveway throughout but appears symmetric      Sensation: intact to LT     Reflex Exam:   DTR's:    Attempted, could not perform due to significant pain even on mild palpation  Toes:    Right may be upgoing difficult to assess due to diffuse pain Clonus:    Clonus is absent.      Assessment/Plan:   72 y.o. female here as a referral from Dr. Nelva Bush for cervical stenosis, chronic pain, weakness causing significant functional impairment that cannot be explained solely from the MRI of the cervical spine and lumbar spine.  She has diffuse pain throughout her body, generalized with global weakness more pronounced in the lower extremities. Her right toe may be upgoing, difficult to assess due to diffuse pain when touching her to examine her.   - She has been referred to Rheumatology, Dxed with Rheumatoid Arthritis and Fibromyalgia which may be the cause of her symptoms - Will perform EMG/NCS as well as several labs today to evaluate for neuromuscular causes - MRI of the brain to evaluate for strokes, MS or any other intracranial lesions, less likely - Follow with Dr. Rolena Infante for management of cervical stenosis and degenerative lumbar disease - Follow with Dr. Nelva Bush for pain management - arch pain may be plantar fasciitis.  - Discussed fall prevention, will order physical therapy for diffuse myofascial pain, weakness in legs, gait disorder, fall risk - EMG/NCS right upper extremity and  bilateral lower extremities  Orders Placed This Encounter  Procedures  . MR BRAIN W  WO CONTRAST  . Hemoglobin A1c  . Vitamin B1  . Methylmalonic acid, serum  . B. burgdorfi Antibody  . TSH  . HIV antibody  . Sedimentation rate  . B12 and Folate Panel  . RPR  . Hepatitis C antibody  . Heavy metals, blood  . Vitamin B6  . Multiple Myeloma Panel (SPEP&IFE w/QIG)  . Basic Metabolic Panel  . Ambulatory referral to Physical Therapy  . NCV with EMG(electromyography)   Cc: Quentin Cornwall, MD Dr. Nelva Bush Dr. Sedalia Muta, MD  [facility] [address] [place], [place] [address]  Phone [phone] Fax [phone]

## 2017-09-09 ENCOUNTER — Ambulatory Visit (INDEPENDENT_AMBULATORY_CARE_PROVIDER_SITE_OTHER): Payer: Medicare Other

## 2017-09-09 DIAGNOSIS — J4550 Severe persistent asthma, uncomplicated: Secondary | ICD-10-CM | POA: Diagnosis not present

## 2017-09-11 ENCOUNTER — Encounter (HOSPITAL_COMMUNITY): Payer: Self-pay | Admitting: Physical Therapy

## 2017-09-11 ENCOUNTER — Ambulatory Visit (HOSPITAL_COMMUNITY): Payer: Medicare Other | Attending: Neurology | Admitting: Physical Therapy

## 2017-09-11 ENCOUNTER — Other Ambulatory Visit: Payer: Self-pay

## 2017-09-11 DIAGNOSIS — R29898 Other symptoms and signs involving the musculoskeletal system: Secondary | ICD-10-CM

## 2017-09-11 DIAGNOSIS — M6281 Muscle weakness (generalized): Secondary | ICD-10-CM | POA: Diagnosis present

## 2017-09-11 DIAGNOSIS — R269 Unspecified abnormalities of gait and mobility: Secondary | ICD-10-CM | POA: Diagnosis present

## 2017-09-11 DIAGNOSIS — R2689 Other abnormalities of gait and mobility: Secondary | ICD-10-CM | POA: Diagnosis present

## 2017-09-11 NOTE — Therapy (Signed)
[facility] [address] [place], [place], [address] Phone: [phone]   Fax:  [phone]  Physical Therapy Evaluation  Patient Details  Name: Betty Golden MRN: [id number] Date of Birth: 08-07-1945 Referring Provider: Anson Fret, MD   Encounter Date: 09/11/2017  PT End of Session - 09/11/17 1549    Visit Number  1    Number of Visits  17    Date for PT Re-Evaluation  10/09/17    Authorization Type  [employer]    Authorization Time Period  09/11/17 - 11/14/17    PT Start Time  1345    PT Stop Time  1435    PT Time Calculation (min)  50 min    Equipment Utilized During Treatment  Gait belt    Activity Tolerance  Patient limited by pain;Patient tolerated treatment well    Behavior During Therapy  [facility] for tasks assessed/performed       Past Medical History:  Diagnosis Date  . Arthritis   . Asthma   . C. difficile colitis   . Cervical stenosis of spine   . Chronic headaches   . Chronic neck pain   . Depression   . Diverticulosis   . Essential hypertension   . Fibromyalgia   . GERD (gastroesophageal reflux disease)   . History of Salmonella gastroenteritis   . HNP (herniated nucleus pulposus), lumbar   . Hyperlipidemia   . IBS (irritable bowel syndrome)   . Lung nodule     Past Surgical History:  Procedure Laterality Date  . APPENDECTOMY  1965  . BREAST BIOPSY    . BREAST REDUCTION SURGERY  1994  . CHOLECYSTECTOMY  1975  . COLONOSCOPY N/A 08/19/2013   Procedure: COLONOSCOPY;  Surgeon: Malissa Hippo, MD;  Location: [facility];  Service: Endoscopy;  Laterality: N/A;  155-moved to 140 Ann to notify pt  . LUMBAR LAMINECTOMY/DECOMPRESSION MICRODISCECTOMY Bilateral 09/20/2015   Procedure: MICRO LUMBAR DECOMPRESSION L5-S1 BILATERAL    (1 LEVEL);  Surgeon: [name], MD;  Location: [facility];  Service: Orthopedics;  Laterality: Bilateral;  . Sinus Surgery    . TONSILLECTOMY  1968    There were no vitals  filed for this visit.   Subjective Assessment - 09/11/17 1346    Subjective  Patient reported that she was diagnosed with fibromyalgia and arthritis last month. Patient reported that she has been having a lot of pain and is constantly in pain. Patient reported that when she walks she feels like she might fall. Patient lives with her daughter and her family who help her with her balance and she holds onto the walls if she's walking. Patient reported that she had back surgery 2 years ago where the physician took a lot of arthritis out. Patient reported that since 2014 she has noticed that her legs will give out from under her. Patient previously got injections into her back, but she stated it was no longer providing relief. Patient reported that she has a sensation where she feels like she has a ball in the bottom of her foot. Patient denied any tingling or numbness. Patient denied any fever or sudden changes in weight. Patient did report some constipation, but she suspects this is due to a medication she is taking.     Limitations  Sitting;Lifting;Standing;Walking;House hold activities    How long can you sit comfortably?  10 minutes    How long can you stand comfortably?  5 minutes  How long can you walk comfortably?  1 minute, but it varies     Diagnostic tests  MRI lumbar spine 06/09/17 L5- S1 disc bulge, with postoperative changes, left L4 foraminal stenosis. Lumbar facet arthropathy. Foraminal stenosis at left L1 and left greater than right L3 nerve levels. Ultrasound Arterial: No significant arterial occlusive disease. X-rays for lumbar surgery.    Patient Stated Goals  Being able to walk tall and straight    Currently in Pain?  Yes    Pain Score  9     Pain Location  Back    Pain Orientation  Left    Pain Descriptors / Indicators  Sharp    Pain Type  Chronic pain    Pain Radiating Towards  Patient reported that the pain goes down the anterior part of her legs    Pain Onset  More than a  month ago    Pain Frequency  Constant    Aggravating Factors   Laying down causes pain in legs to be worse    Pain Relieving Factors  Walking gives a little     Effect of Pain on Daily Activities  High impact    Multiple Pain Sites  -- Patient stated her pain moves around, but her current pain is in her left low back         [facility] PT Assessment - 09/11/17 0001      Assessment   Medical Diagnosis  Weakness, Muscle weakness (generalized), Right leg weakness, left leg weakness, balance disorder, gait abnormality    Referring Provider  Anson Fret, MD    Onset Date/Surgical Date  -- Several Years    Prior Therapy  After back surgery      Precautions   Precaution Comments  None mentioned; may want to limit lifting bending twisting       Restrictions   Weight Bearing Restrictions  No      Balance Screen   Has the patient fallen in the past 6 months  Yes    How many times?  5    Has the patient had a decrease in activity level because of a fear of falling?   Yes    Is the patient reluctant to leave their home because of a fear of falling?   Yes      Home Environment   Living Environment  Private residence    Living Arrangements  Children    Type of Home  Other(Comment) Townhouse    Home Access  Stairs to enter    Entrance Stairs-Number of Steps  2    Entrance Stairs-Rails  None    Home Layout  Two level Patient stays on the first level      Prior Function   Level of Independence  Independent In 2014      Cognition   Overall Cognitive Status  Within Functional Limits for tasks assessed      Observation/Other Assessments   Observations  Patient demonstrated increased kyphosis and forward flexed posture with walking    Focus on Therapeutic Outcomes (FOTO)   12% (88% limited)      Sensation   Light Touch  Appears Intact      Posture/Postural Control   Posture Comments  Thoracic kyphosis      Tone   Assessment Location  -- DTR's unable to test due to pain; Hoffman's  neg bilaterally       AROM   Overall AROM Comments  All lumbar range of  motion patient reported pain    Lumbar Flexion  Severely limited    Lumbar Extension  Severely limited    Lumbar - Right Side Bend  Severely limited    Lumbar - Left Side Bend  Severely limited    Lumbar - Right Rotation  Severely limited    Lumbar - Left Rotation  Severely limited      Strength   Overall Strength Comments  Patient seemed to be limited by pain, and noted cogwheeling intermittently throughout    Right/Left Hip  Right;Left    Right Hip Flexion  3/5    Right Hip Extension  2+/5    Right Hip ABduction  3+/5    Left Hip Flexion  3/5    Left Hip Extension  2-/5    Left Hip ABduction  3+/5    Right/Left Knee  Right;Left    Right Knee Flexion  2+/5    Right Knee Extension  3+/5    Left Knee Flexion  3-/5    Left Knee Extension  3+/5    Right/Left Ankle  Right;Left    Right Ankle Dorsiflexion  3+/5    Right Ankle Plantar Flexion  2/5    Left Ankle Dorsiflexion  3+/5    Left Ankle Plantar Flexion  2/5      Palpation   Spinal mobility  General hypomobility throughout    Palpation comment  Noted increased muscular restrictions in lumbar paraspinals      Ambulation/Gait   Ambulation/Gait  Yes    Ambulation/Gait Assistance  4: Min guard    Ambulation Distance (Feet)  226 Feet ; 1 standing break at 190 feet    Assistive device  Straight cane    Gait Pattern  Decreased arm swing - right;Decreased arm swing - left;Decreased stride length;Antalgic;Decreased dorsiflexion - right;Decreased dorsiflexion - left;Decreased hip/knee flexion - left;Decreased hip/knee flexion - right;Trunk flexed    Ambulation Surface  Level    Gait velocity  0.57    Gait Comments  Patient ambulated with very slow velocity and unsteadiness      Berg Balance Test   Sit to Stand  Able to stand  independently using hands    Standing Unsupported  Able to stand 2 minutes with supervision    Sitting with Back Unsupported but  Feet Supported on Floor or Stool  Able to sit safely and securely 2 minutes    Stand to Sit  Sits safely with minimal use of hands    Transfers  Able to transfer safely, definite need of hands    Standing Unsupported with Eyes Closed  Able to stand 10 seconds safely    Standing Ubsupported with Feet Together  Able to place feet together independently but unable to hold for 30 seconds    From Standing, Reach Forward with Outstretched Arm  Can reach forward >5 cm safely (2")    From Standing Position, Pick up Object from Floor  Unable to pick up shoe, but reaches 2-5 cm (1-2") from shoe and balances independently    From Standing Position, Turn to Look Behind Over each Shoulder  Turn sideways only but maintains balance    Turn 360 Degrees  Needs close supervision or verbal cueing    Standing Unsupported, Alternately Place Feet on Step/Stool  Able to complete >2 steps/needs minimal assist    Standing Unsupported, One Foot in [employer] balance while stepping or standing    Standing on One Leg  Tries to lift leg/unable to hold  3 seconds but remains standing independently    Total Score  32      Timed Up and Go Test   TUG  Normal TUG    Normal TUG (seconds)  20    TUG Comments  With use of hands to push up from chair, but without use of cane             Objective measurements completed on examination: See above findings.              PT Education - 09/11/17 1956    Education provided  Yes    Education Details  Patient was educated on examination findings and plan of care.     Person(s) Educated  Patient    Methods  Explanation    Comprehension  Verbalized understanding       PT Short Term Goals - 09/11/17 2025      PT SHORT TERM GOAL #1   Title  Patient will demonstrate understanding and report regular compiance with HEP.     Time  4    Period  Weeks    Status  New    Target Date  10/09/17      PT SHORT TERM GOAL #2   Title  Patient will demonstrate improvement  in MMT of 1/2 grade in all deficient planes in order to assist with gait and functional mobility.     Time  4    Period  Weeks    Status  New    Target Date  10/09/17      PT SHORT TERM GOAL #3   Title  Patient will perform TUG in <14 seconds indicating decreased risk of falling and improved mobility.     Time  4    Period  Weeks    Status  New        PT Long Term Goals - 09/11/17 2031      PT LONG TERM GOAL #1   Title  Patient will demonstrate improved MMT strength of 1 grade in all deficient planes in order to assist patient with gait and fucntional mobility.     Time  8    Period  Weeks    Status  New    Target Date  11/06/17      PT LONG TERM GOAL #2   Title  Patient will ambulate 300 feet on with least restrictive assistive device, improved mechanics, and without rest break indicating improved gait velocity and ambulation tolerance.     Time  8    Period  Weeks    Status  New    Target Date  11/06/17      PT LONG TERM GOAL #3   Title  Patient will demonstrate improvement on Berg Balance Test of 10 points indicating improved balance and decreased risk of falls.     Time  8    Period  Weeks    Status  New    Target Date  11/06/17             Plan - 09/11/17 2045    Clinical Impression Statement  Patient is a 72 year old female who presented to physical therapy with complaints of constant and severe pain in her low back and legs which travels to other areas of her body, decreased activity tolerance, and decreased balance. Upon examination, patient demonstrated decreased spinal range of motion, decreased lower extremity strength bilaterally, and impaired balance. Both patient's TUG and Hospital doctor  scores placed the patient at an increased risk of falls. In addition, patient's gait velocity and tolerance was below the normal for her age group. Therapist was unable to assess patient's deep tendon reflexes this session due to patient being extremely sensitive  to touch on her legs, however patient was negative on Hoffman's test bilaterally. Patient did report some constipation and although it seemed most likely due to a change in medication, therapist instructed patient to follow-up with her physician regarding this. Patient would benefit from skilled physical therapy in order to address the abovementioned impairments in order to improve overall functional mobility, decrease risk of falls, and improve patient's quality of life.     History and Personal Factors relevant to plan of care:  Lumbar laminectomy/decompression Microdiscectomy 1 level 2017; Asthma; HTN; arthritis; Fibromyalgia    Clinical Presentation  Evolving    Clinical Presentation due to:  MMT, TUG, Berg Balance Test, , FOTO, clinical judgement    Clinical Decision Making  High    Rehab Potential  Fair    Clinical Impairments Affecting Rehab Potential  Positive: Patient's motivation, family support. Negative: severity of pain, medical complexity, chronicity of issue    PT Frequency  2x / week    PT Duration  8 weeks    PT Treatment/Interventions  ADLs/Self Care Home Management;Aquatic Therapy;Cryotherapy;Moist Heat;DME Instruction;Gait training;Stair training;Functional mobility training;Therapeutic activities;Therapeutic exercise;Balance training;Neuromuscular re-education;Patient/family education;Manual techniques;Passive range of motion;Dry needling;Energy conservation    PT Next Visit Plan  Initiate HEP: Supine lower extremity exercises; Follow up about B&B issues; initiate lower extremity strengthening, and balance activities     PT Home Exercise Plan  Initiate next session    Consulted and Agree with Plan of Care  Patient       Patient will benefit from skilled therapeutic intervention in order to improve the following deficits and impairments:  Abnormal gait, Improper body mechanics, Pain, Decreased mobility, Postural dysfunction, Decreased activity tolerance, Decreased endurance,  Decreased range of motion, Decreased strength, Hypomobility, Decreased balance, Difficulty walking, Impaired flexibility  Visit Diagnosis: Muscle weakness (generalized)  Bilateral leg weakness  Balance disorder  Gait abnormality     Problem List Patient Active Problem List   Diagnosis Date Noted  . Fibromyalgia 09/08/2017  . Spinal stenosis of lumbar region 09/20/2015  . 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 Moderate persistent asthma 07/03/2015  . Allergic rhinitis 07/03/2015  . Colitis due to Clostridium difficile 02/10/2015  . Abdominal pain 02/10/2015  . Nausea and vomiting 02/10/2015  . Acute renal failure (HCC) 02/10/2015  . Dehydration 02/10/2015  . Hypoxia 02/10/2015  . Hyponatremia 02/10/2015  . Hypokalemia 02/10/2015  . COPD (chronic obstructive pulmonary disease) (HCC)   . Unspecified constipation 07/15/2013  . DYSPNEA 04/05/2008  . Essential hypertension 03/10/2008  . Headache(784.0) 03/10/2008  . Cough 03/10/2008   Verne Carrow PT, DPT 9:02 PM, 09/11/17 [phone]  [facility] [address] [place], [place], [address] Phone: [phone]   Fax:  [phone]  Name: Betty Golden MRN: [id number] Date of Birth: Dec 09, 1945

## 2017-09-12 ENCOUNTER — Telehealth: Payer: Self-pay | Admitting: *Deleted

## 2017-09-12 LAB — VITAMIN B6: VITAMIN B6: 10 ug/L (ref 2.0–32.8)

## 2017-09-12 LAB — MULTIPLE MYELOMA PANEL, SERUM
ALPHA 1: 0.2 g/dL (ref 0.0–0.4)
ALPHA2 GLOB SERPL ELPH-MCNC: 0.9 g/dL (ref 0.4–1.0)
Albumin SerPl Elph-Mcnc: 3.9 g/dL (ref 2.9–4.4)
Albumin/Glob SerPl: 1.1 (ref 0.7–1.7)
B-Globulin SerPl Elph-Mcnc: 1 g/dL (ref 0.7–1.3)
Gamma Glob SerPl Elph-Mcnc: 1.4 g/dL (ref 0.4–1.8)
Globulin, Total: 3.6 g/dL (ref 2.2–3.9)
IGG (IMMUNOGLOBIN G), SERUM: 1432 mg/dL (ref 700–1600)
IGM (IMMUNOGLOBULIN M), SRM: 116 mg/dL (ref 26–217)
IgA/Immunoglobulin A, Serum: 192 mg/dL (ref 87–352)
Total Protein: 7.5 g/dL (ref 6.0–8.5)

## 2017-09-12 LAB — VITAMIN B1: THIAMINE: 115.6 nmol/L (ref 66.5–200.0)

## 2017-09-12 LAB — HIV ANTIBODY (ROUTINE TESTING W REFLEX): HIV Screen 4th Generation wRfx: NONREACTIVE

## 2017-09-12 LAB — METHYLMALONIC ACID, SERUM: Methylmalonic Acid: 173 nmol/L (ref 0–378)

## 2017-09-12 LAB — BASIC METABOLIC PANEL
BUN / CREAT RATIO: 21 (ref 12–28)
BUN: 18 mg/dL (ref 8–27)
CO2: 26 mmol/L (ref 20–29)
CREATININE: 0.85 mg/dL (ref 0.57–1.00)
Calcium: 10.2 mg/dL (ref 8.7–10.3)
Chloride: 100 mmol/L (ref 96–106)
GFR, EST AFRICAN AMERICAN: 81 mL/min/{1.73_m2} (ref >59)
GFR, EST NON AFRICAN AMERICAN: 70 mL/min/{1.73_m2} (ref >59)
GLUCOSE: 86 mg/dL (ref 65–99)
Potassium: 4 mmol/L (ref 3.5–5.2)
SODIUM: 143 mmol/L (ref 134–144)

## 2017-09-12 LAB — HEMOGLOBIN A1C
ESTIMATED AVERAGE GLUCOSE: 111 mg/dL
Hgb A1c MFr Bld: 5.5 % (ref 4.8–5.6)

## 2017-09-12 LAB — B. BURGDORFI ANTIBODIES: Lyme IgG/IgM Ab: <0.91 {ISR} (ref 0.00–0.90)

## 2017-09-12 LAB — TSH: TSH: 4.43 u[IU]/mL (ref 0.450–4.500)

## 2017-09-12 LAB — RPR: RPR Ser Ql: NONREACTIVE

## 2017-09-12 LAB — HEAVY METALS, BLOOD
ARSENIC: 7 ug/L (ref 2–23)
LEAD, BLOOD: NOT DETECTED ug/dL (ref 0–4)
MERCURY: NOT DETECTED ug/L (ref 0.0–14.9)

## 2017-09-12 LAB — B12 AND FOLATE PANEL
Folate: 13.5 ng/mL (ref >3.0)
Vitamin B-12: 603 pg/mL (ref 232–1245)

## 2017-09-12 LAB — SEDIMENTATION RATE: Sed Rate: 12 mm/hr (ref 0–40)

## 2017-09-12 LAB — HEPATITIS C ANTIBODY: HEP C VIRUS AB: 0.1 {s_co_ratio} (ref 0.0–0.9)

## 2017-09-12 NOTE — Telephone Encounter (Signed)
Spoke with patient and informed her that all her labs are normal. She verbalized understanding, appreciation.

## 2017-09-12 NOTE — Addendum Note (Signed)
Addended by: Verne Carrow on: 09/12/2017 12:58 PM   Modules accepted: Orders

## 2017-09-17 ENCOUNTER — Ambulatory Visit: Payer: Medicare Other | Admitting: Neurology

## 2017-09-17 ENCOUNTER — Ambulatory Visit (INDEPENDENT_AMBULATORY_CARE_PROVIDER_SITE_OTHER): Payer: Medicare Other | Admitting: Neurology

## 2017-09-17 DIAGNOSIS — M791 Myalgia, unspecified site: Secondary | ICD-10-CM | POA: Diagnosis not present

## 2017-09-17 DIAGNOSIS — Z0289 Encounter for other administrative examinations: Secondary | ICD-10-CM

## 2017-09-17 DIAGNOSIS — M6281 Muscle weakness (generalized): Secondary | ICD-10-CM

## 2017-09-17 NOTE — Procedures (Signed)
Full Name: Betty Golden Gender: Female MRN #: [id number] Date of Birth: 11-07-1945    Visit Date: 09/17/2017 10:39 Age: 72 Years 29 Months Old Examining Physician: Naomie Dean, MD  Referring Physician: Sheran Luz, MD  History:  72 y.o. female here as a referral from Dr. Ethelene Hal for chronic pain, weakness causing significant functional impairment that cannot be explained solely from the MRI of the cervical spine or lumbar spine.  She has diffuse pain throughout her body, generalized with reported global weakness in the arms and legs more pronounced in the lower extremities. MRI brain pending.  Summary: EMG/NCS was performed on the right upper extremity and bilateral lower extremities.  Conclusion: This is a normal study. No evidence for mononeuropathy, polyneuropathy, cervical or lumbar radiculopathy, myopathy/myositis or neuromuscular disorder.   Cc: Dr. Ramos(referring), Dr. Suzy Bouchard (pcp)  Naomie Dean M.D.  [facility] [address] [place], [place] [address] Tel: [phone] Fax: [phone]        [facility]    Nerve / Sites Muscle Latency Ref. Amplitude Ref. Rel Amp Segments Distance Velocity Ref. Area    ms ms mV mV %  cm m/s m/s mVms  R Median - APB     Wrist APB 3.8 ?4.4 10.2 ?4.0 100 Wrist - APB 7   46.2     Upper arm APB 8.3  10.0  97.9 Upper arm - Wrist 23 51 ?49 44.3  R Ulnar - ADM     Wrist ADM 3.2 ?3.3 8.8 ?6.0 100 Wrist - ADM 7   34.1     B.Elbow ADM 7.2  8.1  92.2 B.Elbow - Wrist 20 50 ?49 31.4     A.Elbow ADM 9.5  7.5  92.7 A.Elbow - B.Elbow 12 52 ?49 30.4         A.Elbow - Wrist      R Peroneal - EDB     Ankle EDB 4.3 ?6.5 7.4 ?2.0 100 Ankle - EDB 9   21.3     Fib head EDB 10.8  6.8  91.4 Fib head - Ankle 29 45 ?44 20.7     Pop fossa EDB 13.1  6.7  98.1 Pop fossa - Fib head 10 44 ?44 21.0         Pop fossa - Ankle      L Peroneal - EDB     Ankle EDB 4.5 ?6.5 5.6 ?2.0 100 Ankle - EDB 9   18.1     Fib head EDB 11.0  4.9  88.4 Fib  head - Ankle 29 45 ?44 16.6     Pop fossa EDB 13.3  4.8  97.9 Pop fossa - Fib head 10 44 ?44 16.2         Pop fossa - Ankle      R Tibial - AH     Ankle AH 4.7 ?5.8 14.3 ?4.0 100 Ankle - AH 9   33.0     Pop fossa AH 13.2  11.2  78.6 Pop fossa - Ankle 36 42 ?41 33.3  L Tibial - AH     Ankle AH 4.3 ?5.8 13.7 ?4.0 100 Ankle - AH 9   36.1     Pop fossa AH 13.2  11.3  81.9 Pop fossa - Ankle 36 41 ?41 34.5                 SNC    Nerve / Sites Rec. Site Peak Lat Ref.  Amp Ref.  Segments Distance Peak Diff Ref.    ms ms V V  cm ms ms  R Sural - Ankle (Calf)     Calf Ankle 4.0 ?4.4 14 ?6 Calf - Ankle 14    L Sural - Ankle (Calf)     Calf Ankle 3.8 ?4.4 9 ?6 Calf - Ankle 14    L Superficial peroneal - Ankle     Lat leg Ankle 4.3 ?4.4 10 ?6 Lat leg - Ankle 14    R Superficial peroneal - Ankle     Lat leg Ankle 4.1 ?4.4 6 ?6 Lat leg - Ankle 14    R Median, Ulnar - Transcarpal comparison     Median Palm Wrist 2.0 ?2.2 40 ?35 Median Palm - Wrist 8       Ulnar Palm Wrist 2.1 ?2.2 10 ?12 Ulnar Palm - Wrist 8          Median Palm - Ulnar Palm  -0.1 ?0.4  R Median - Orthodromic (Dig II, Mid palm)     Dig II Wrist 3.2 ?3.4 17 ?10 Dig II - Wrist 13    R Ulnar - Orthodromic, (Dig V, Mid palm)     Dig V Wrist 3.0 ?3.1 8 ?5 Dig V - Wrist 57                     F  Wave    Nerve F Lat Ref.   ms ms  L Tibial - AH 53.2 ?56.0  R Tibial - AH 53.5 ?56.0  R Ulnar - ADM 30.2 ?32.0               EMG full       EMG Summary Table    Spontaneous MUAP Recruitment  Muscle IA Fib PSW Fasc Other Amp Dur. Poly Pattern  R. Deltoid Normal None None None _______ Normal Normal Normal Normal  R. Biceps brachii Normal None None None _______ Normal Normal Normal Normal  R. Triceps brachii Normal None None None _______ Normal Normal Normal Normal  R. Pronator teres Normal None None None _______ Normal Normal Normal Normal  R. First dorsal interosseous Normal None None None _______ Normal Normal Normal Normal   R. Iliopsoas Normal None None None _______ Normal Normal Normal Normal  R. Vastus medialis Normal None None None _______ Normal Normal Normal Normal  R. Tibialis anterior Normal None None None _______ Normal Normal Normal Normal  R. Gastrocnemius (Medial head) Normal None None None _______ Normal Normal Normal Normal  R. Biceps femoris (long head) Normal None None None _______ Normal Normal Normal Normal  R. Gluteus maximus Normal None None None _______ Normal Normal Normal Normal  R. Cervical paraspinals (low) Normal None None None _______ Normal Normal Normal Normal

## 2017-09-17 NOTE — Progress Notes (Signed)
See procedure note.

## 2017-09-17 NOTE — Progress Notes (Addendum)
[facility]    Provider:  Dr Jaynee Eagles Referring Provider: Dr. Suella Broad Primary Care Physician:  Quentin Cornwall, MD  CC:  Weakness and pain  Interval history 09/17/2017:  Patient returns today for follow up and emg/ncs. EMG/NCS on the bilateral lower extremities and right upper extremities were normal.  Patient is in a lot more pain today, today is an especially "bad day". She has a lot of muscle and joint pain and feels very weak generally. No focal weakness. Reviewed all lab tests results with patient which were normal including: bmp,ife,b6,heavy metals, hep c, tpt, b12, folate, esr, hiv, tsh, lyme, mma, b1, hgba1c. All labs were perfectly normal as was the emg/ncs. She has MRI brain pending tomorrow, she is worried she has MS.  Reassured patient. If MRI brain unremarkable will refer back to pcp and rheumatology.  HPI:  Betty Golden is a 72 y.o. female here as a referral from Dr. Nelva Bush for cervical stenosis, chronic pain, weakness causing significant functional impairment that cannot be explained solely from the MRI of the cervical spine and lumbar spine, IBS, HLD, Fibromomyalgia, HTN, depression, Rheumatoid Arthritis(per patient).  Past medical history cervical spinal stenosis, degenerative lumbar disc, sacroiliac joint disease, cervical radiculitis, lumbar spine pain status post lumbar microdiscectomy. Since 2014 she has been in a lot of pain. When she walks, her legs buckle and "let go". She has to hold onto the walls. Always the right side and now the left. Progressive. She also has pain in the proximal arms as well but not as bad as the legs. The pain in her legs is mostly in the upper thighs, front and back and in the lower spine . Very sensitive. Night is worse. She can;t explain the pain, she just says severe pain. She has foot arch pain. No inciting events, no falls, onset was acute. Nothing makes it better, heat and stretching doesn't help, steroid shots don;t help.  She has a lot of arthritis. She has sharp pains in her forehead. No bowel or bladder changes. Not losing weight. No muscle wasting. No facial weakness. No dysphagia. No tremors.No other focal neurologic deficits, associated symptoms, inciting events or modifiable factors.  Reviewed notes, labs and imaging from outside physicians, which showed:   Reviewed referring physician's notes.  She is a 72 year old patient who was seeing Dr. Herma Mering for neck pain.  She has right-sided neck pain.  Pain, aching and throbbing.  Feels that they are doing poorly in the meds are not working and reports her pain level to be a 10 out of 10.  The patient came in following MRI.  She is significantly more limited from her functional activities and I can explain based on her lumbar as well as her cervical MRI.  She has moderate central canal stenosis.  She is seeing Dr. Rolena Infante for this.  Not convinced she is myelopathic.  Not convinced that this is causing all of her symptoms.  They also referred her to rheumatology.  She was sent over here for evaluation of weakness.  No myelopathic features.  Ongoing for 4 years.   CT head 05/2010 showed no acute intracranial abnormalities including mass lesion or mass effect, hydrocephalus, extra-axial fluid collection, midline shift, hemorrhage, or acute infarction, large ischemic events (personally reviewed images)   Reviewed reports of MRI lumbar spine and cervical spine:  MRI L-spine: 1. L5-S1 chronic circumferential disc bulge and endplate spurring with superimposed severe chronic facet arthropathy and postoperative changes to the bilateral posterior elements.  Moderate multifactorial left L4 foraminal stenosis in part related to endplate spurring. No convincing spinal, lateral recess, or right foraminal stenosis. 2. Limited lumbar disc and endplate degeneration elsewhere, mostly L3-L4. Widespread lumbar facet arthropathy, mostly moderate. No other lumbar spinal or lateral  recess stenosis. 3. Multifactorial mild neural foraminal stenosis at the left L1 and left greater than right L3 nerve levels.  MRI of the cervical spine report impression: Congenitally narrow spinal canal, C4-C5 mild posterior discovertebral osteophytosis and congenitally narrowed spinal canal produce moderate central canal stenosis without mass-effect on the cord.  Severe left neural foraminal stenosis due to uncovertebral arthrosis.  C3-C4 moderate central canal stenosis on a congenital basis without mass-effect in the cord.  Right uncovertebral arthrosis.  C5-C6 small central posterior disc protrusion and congenitally narrowed spinal canal resulting in mild to moderate central canal stenosis.  C7-T1 left facet arthrosis.  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  Bulky maxillary fluid compatible with sinusitis.    Review of Systems: Patient complains of symptoms per HPI as well as the following symptoms: weakness, muscle pain, joint pain, imbalance, fall risk. Pertinent negatives and positives per HPI. All others negative.   Social History   Socioeconomic History  . Marital status: Single    Spouse name: Not on file  . Number of children: 1  . Years of education: Not on file  . Highest education level: GED or equivalent  Social Needs  . Financial resource strain: Not on file  . Food insecurity - worry: Not on file  . Food insecurity - inability: Not on file  . Transportation needs - medical: Not on file  . Transportation needs - non-medical: Not on file  Occupational History  . Not on file  Tobacco Use  . Smoking status: Former Smoker    Packs/day: 2.00    Years: 10.00    Pack years: 20.00    Types: Cigarettes    Last attempt to quit: 09/11/1988    Years since quitting: 29.0  . Smokeless tobacco: Never Used  . Tobacco comment: quit 1990  Substance and Sexual Activity  . Alcohol use: No    Comment: stopped in 1990  . Drug use:  No    Comment: hx smoking marijuana and cocaine weekends. Stopped in the 1990s.  . Sexual activity: Not on file  Other Topics Concern  . Not on file  Social History Narrative   Lives at home with her daughter & daughter's family (they live with her)   Right handed   Drinks no caffeine    Family History  Problem Relation Age of Onset  . Heart disease Mother   . Hypertension Mother   . Asthma Mother   . Heart disease Father   . Stroke Father   . Hypertension Father   . Stroke Sister   . Asthma Sister   . Hypertension Brother   . Asthma Brother   . Asthma Maternal Grandmother   . Heart disease Maternal Grandmother   . Diabetes Maternal Grandfather   . Colon cancer Neg Hx     Past Medical History:  Diagnosis Date  . Arthritis   . Asthma   . C. difficile colitis   . Cervical stenosis of spine   . Chronic headaches   . Chronic neck pain   . Depression   . Diverticulosis   . Essential hypertension   . Fibromyalgia   . GERD (gastroesophageal reflux disease)   . History of Salmonella  gastroenteritis   . HNP (herniated nucleus pulposus), lumbar   . Hyperlipidemia   . IBS (irritable bowel syndrome)   . Lung nodule     Past Surgical History:  Procedure Laterality Date  . APPENDECTOMY  1965  . BREAST BIOPSY    . BREAST REDUCTION SURGERY  1994  . CHOLECYSTECTOMY  1975  . COLONOSCOPY N/A 08/19/2013   Procedure: COLONOSCOPY;  Surgeon: Rogene Houston, MD;  Location: [facility];  Service: Endoscopy;  Laterality: N/A;  155-moved to 140 Ann to notify pt  . LUMBAR LAMINECTOMY/DECOMPRESSION MICRODISCECTOMY Bilateral 09/20/2015   Procedure: MICRO LUMBAR DECOMPRESSION L5-S1 BILATERAL    (1 LEVEL);  Surgeon: Susa Day, MD;  Location: [facility];  Service: Orthopedics;  Laterality: Bilateral;  . Sinus Surgery    . TONSILLECTOMY  1968    Current Outpatient Medications  Medication Sig Dispense Refill  . acetaminophen (TYLENOL) 500 MG tablet Take 1 tablet (500 mg total) by mouth  every 6 (six) hours as needed for mild pain. Do not exceed 4 grams of acetaminophen daily in combination with Norco (Patient not taking: Reported on 09/08/2017)  0  . albuterol (PROAIR HFA) 108 (90 Base) MCG/ACT inhaler Inhale 2 puffs into the lungs every 4 (four) hours as needed for wheezing or shortness of breath.     . Azelastine-Fluticasone 137-50 MCG/ACT SUSP Place 2 sprays into the nose 2 (two) times daily. 1 Bottle 3  . DULoxetine (CYMBALTA) 20 MG capsule Take 20 mg by mouth 2 (two) times daily.    Marland Kitchen FLUoxetine (PROZAC) 20 MG capsule Take 20 mg by mouth daily.    . fluticasone furoate-vilanterol (BREO ELLIPTA) 100-25 MCG/INH AEPB Inhale 1 puff into the lungs daily.    . fluticasone furoate-vilanterol (BREO ELLIPTA) 200-25 MCG/INH AEPB Inhale 1 puff into the lungs daily. (Patient not taking: Reported on 09/12/2017) 60 each 2  . folic acid (FOLVITE) 1 MG tablet Take 1 mg by mouth daily.    Marland Kitchen gabapentin (NEURONTIN) 100 MG capsule Take 100 mg by mouth 2 (two) times daily.     Marland Kitchen losartan-hydrochlorothiazide (HYZAAR) 100-12.5 MG per tablet Take 1 tablet by mouth daily.    . methotrexate 2.5 MG tablet Take 10 mg by mouth once a week. For 30 days    . montelukast (SINGULAIR) 10 MG tablet TAKE 1 TABLET (10 MG TOTAL) BY MOUTH AT BEDTIME. 30 tablet 3  . simvastatin (ZOCOR) 20 MG tablet Take 20 mg by mouth at bedtime.      Current Facility-Administered Medications  Medication Dose Route Frequency Provider Last Rate Last Dose  . Benralizumab SOSY 30 mg  30 mg Subcutaneous Q28 days Valentina Shaggy, MD   30 mg at 09/09/17 1216    Allergies as of 09/17/2017 - Review Complete 09/11/2017  Allergen Reaction Noted  . Sulfa antibiotics Rash 02/10/2015    Vitals: There were no vitals taken for this visit. Last Weight:  Wt Readings from Last 1 Encounters:  09/08/17 166 lb (75.3 kg)   Last Height:   Ht Readings from Last 1 Encounters:  09/08/17 '5\' 4"'$  (1.626 m)    Physical exam: Exam: Gen:  NAD, conversant, well nourised, well groomed                     CV: RRR, no MRG. No Carotid Bruits. No peripheral edema, warm, nontender Eyes: Conjunctivae clear without exudates or hemorrhage  Neuro: Detailed Neurologic Exam  Speech:    Speech is normal; fluent and  spontaneous with normal comprehension.  Cognition:    The patient is oriented to person, place, and time;     recent and remote memory intact;     language fluent;     normal attention, concentration,     fund of knowledge Cranial Nerves:    The pupils are equal, round, and reactive to light. The fundi are normal and spontaneous venous pulsations are present. Visual fields are full to finger confrontation. Extraocular movements are intact. Trigeminal sensation is intact and the muscles of mastication are normal. The face is symmetric. The palate elevates in the midline. Hearing impaired. Voice is normal. Shoulder shrug is normal. The tongue has normal motion without fasciculations.   Coordination:    Normal finger to nose.   Gait:antalgic      Motor Observation:    No asymmetry, no atrophy, and no involuntary movements noted. Tone:    Normal muscle tone.    Posture:    Posture is normal. normal erect    Strength: Poor effort and giveway throughout but appears symmetric      Sensation: intact to LT     Reflex Exam:   DTR's:    Attempted, could not perform due to significant pain even on mild palpation  Toes:    Right may be upgoing difficult to assess due to diffuse pain Clonus:    Clonus is absent.      Assessment/Plan:   72 y.o. female here as a referral from Dr. Nelva Bush for cervical stenosis, chronic pain, weakness causing significant functional impairment that cannot be explained solely from the MRI of the cervical spine and lumbar spine.  She has diffuse pain throughout her body, generalized with global weakness more pronounced in the lower extremities. Her right toe may be upgoing, difficult to assess  due to diffuse pain when touching her to examine her.   Extensive lab testing and emg/ncs normal.( bmp,ife,b6,heavy metals, hep c, tpt, b12, folate, esr, hiv, tsh, lyme, mma, b1, hgba1c. All labs were perfectly normal as was the emg/ncs) - She has been referred to Rheumatology, Dxed with Rheumatoid Arthritis and Fibromyalgia which may be the cause of her symptoms, discussed again today as all neurologic workup normal to date - MRI of the brain to evaluate for strokes, MS or any other intracranial lesions, less likely: pending tomorrow - Follow with Dr. Rolena Infante for management of cervical stenosis and degenerative lumbar disease - Follow with Dr. Nelva Bush for pain management - arch pain may be plantar fasciitis.  - Discussed fall prevention, ordered physical therapy for diffuse myofascial pain, weakness in legs, gait disorder, fall risk. Encouraged her to schedule with PT.  Cc: Quentin Cornwall, MD Dr. Nelva Bush Dr. Sedalia Muta, MD  [facility] [address] [place], [place] [address]  Phone [phone] Fax [phone]  A total of 15 minutes was spent in with this patient. Over half this time was spent on counseling patient on the muscle pain, weakness, chronic pain diagnosis and future diagnostic options available. This does not include time spent performing and discussing emg/ncs test.

## 2017-09-17 NOTE — Progress Notes (Signed)
     Full Name: Betty Golden Gender: Female MRN #: [id number] Date of Birth: 09/13/1945    Visit Date: 09/17/2017 10:39 Age: 71 Years 11 Months Old Examining Physician: Antonia Ahern, MD  Referring Physician: Richard Ramos, MD  History:  71 y.o. female here as a referral from Dr. Ramos for chronic pain, weakness causing significant functional impairment that cannot be explained solely from the MRI of the cervical spine or lumbar spine.  She has diffuse pain throughout her body, generalized with reported global weakness in the arms and legs more pronounced in the lower extremities. MRI brain pending.  Summary: EMG/NCS was performed on the right upper extremity and bilateral lower extremities.  Conclusion: This is a normal study. No evidence for mononeuropathy, polyneuropathy, cervical or lumbar radiculopathy, myopathy/myositis or neuromuscular disorder.   Cc: Dr. Ramos(referring), Dr. James Milam (pcp)  Antonia Ahern M.D.  [facility] [address] [place], [place] [address] Tel: [phone] Fax: [phone]        MNC    Nerve / Sites Muscle Latency Ref. Amplitude Ref. Rel Amp Segments Distance Velocity Ref. Area    ms ms mV mV %  cm m/s m/s mVms  R Median - APB     Wrist APB 3.8 ?4.4 10.2 ?4.0 100 Wrist - APB 7   46.2     Upper arm APB 8.3  10.0  97.9 Upper arm - Wrist 23 51 ?49 44.3  R Ulnar - ADM     Wrist ADM 3.2 ?3.3 8.8 ?6.0 100 Wrist - ADM 7   34.1     B.Elbow ADM 7.2  8.1  92.2 B.Elbow - Wrist 20 50 ?49 31.4     A.Elbow ADM 9.5  7.5  92.7 A.Elbow - B.Elbow 12 52 ?49 30.4         A.Elbow - Wrist      R Peroneal - EDB     Ankle EDB 4.3 ?6.5 7.4 ?2.0 100 Ankle - EDB 9   21.3     Fib head EDB 10.8  6.8  91.4 Fib head - Ankle 29 45 ?44 20.7     Pop fossa EDB 13.1  6.7  98.1 Pop fossa - Fib head 10 44 ?44 21.0         Pop fossa - Ankle      L Peroneal - EDB     Ankle EDB 4.5 ?6.5 5.6 ?2.0 100 Ankle - EDB 9   18.1     Fib head EDB 11.0  4.9  88.4 Fib  head - Ankle 29 45 ?44 16.6     Pop fossa EDB 13.3  4.8  97.9 Pop fossa - Fib head 10 44 ?44 16.2         Pop fossa - Ankle      R Tibial - AH     Ankle AH 4.7 ?5.8 14.3 ?4.0 100 Ankle - AH 9   33.0     Pop fossa AH 13.2  11.2  78.6 Pop fossa - Ankle 36 42 ?41 33.3  L Tibial - AH     Ankle AH 4.3 ?5.8 13.7 ?4.0 100 Ankle - AH 9   36.1     Pop fossa AH 13.2  11.3  81.9 Pop fossa - Ankle 36 41 ?41 34.5                 SNC    Nerve / Sites Rec. Site Peak Lat Ref.  Amp Ref.   Segments Distance Peak Diff Ref.    ms ms V V  cm ms ms  R Sural - Ankle (Calf)     Calf Ankle 4.0 ?4.4 14 ?6 Calf - Ankle 14    L Sural - Ankle (Calf)     Calf Ankle 3.8 ?4.4 9 ?6 Calf - Ankle 14    L Superficial peroneal - Ankle     Lat leg Ankle 4.3 ?4.4 10 ?6 Lat leg - Ankle 14    R Superficial peroneal - Ankle     Lat leg Ankle 4.1 ?4.4 6 ?6 Lat leg - Ankle 14    R Median, Ulnar - Transcarpal comparison     Median Palm Wrist 2.0 ?2.2 40 ?35 Median Palm - Wrist 8       Ulnar Palm Wrist 2.1 ?2.2 10 ?12 Ulnar Palm - Wrist 8          Median Palm - Ulnar Palm  -0.1 ?0.4  R Median - Orthodromic (Dig II, Mid palm)     Dig II Wrist 3.2 ?3.4 17 ?10 Dig II - Wrist 13    R Ulnar - Orthodromic, (Dig V, Mid palm)     Dig V Wrist 3.0 ?3.1 8 ?5 Dig V - Wrist 11                     F  Wave    Nerve F Lat Ref.   ms ms  L Tibial - AH 53.2 ?56.0  R Tibial - AH 53.5 ?56.0  R Ulnar - ADM 30.2 ?32.0               EMG full       EMG Summary Table    Spontaneous MUAP Recruitment  Muscle IA Fib PSW Fasc Other Amp Dur. Poly Pattern  R. Deltoid Normal None None None _______ Normal Normal Normal Normal  R. Biceps brachii Normal None None None _______ Normal Normal Normal Normal  R. Triceps brachii Normal None None None _______ Normal Normal Normal Normal  R. Pronator teres Normal None None None _______ Normal Normal Normal Normal  R. First dorsal interosseous Normal None None None _______ Normal Normal Normal Normal   R. Iliopsoas Normal None None None _______ Normal Normal Normal Normal  R. Vastus medialis Normal None None None _______ Normal Normal Normal Normal  R. Tibialis anterior Normal None None None _______ Normal Normal Normal Normal  R. Gastrocnemius (Medial head) Normal None None None _______ Normal Normal Normal Normal  R. Biceps femoris (long head) Normal None None None _______ Normal Normal Normal Normal  R. Gluteus maximus Normal None None None _______ Normal Normal Normal Normal  R. Cervical paraspinals (low) Normal None None None _______ Normal Normal Normal Normal      

## 2017-09-18 ENCOUNTER — Ambulatory Visit (HOSPITAL_COMMUNITY): Payer: Medicare Other

## 2017-09-18 ENCOUNTER — Ambulatory Visit
Admission: RE | Admit: 2017-09-18 | Discharge: 2017-09-18 | Disposition: A | Discharge status: Home / Self Care | Payer: Medicare Other | Source: Ambulatory Visit | Attending: Neurology | Admitting: Neurology

## 2017-09-18 DIAGNOSIS — R2689 Other abnormalities of gait and mobility: Secondary | ICD-10-CM

## 2017-09-18 DIAGNOSIS — M6281 Muscle weakness (generalized): Secondary | ICD-10-CM | POA: Diagnosis not present

## 2017-09-18 DIAGNOSIS — R29898 Other symptoms and signs involving the musculoskeletal system: Secondary | ICD-10-CM | POA: Diagnosis not present

## 2017-09-18 DIAGNOSIS — R531 Weakness: Secondary | ICD-10-CM

## 2017-09-18 DIAGNOSIS — R269 Unspecified abnormalities of gait and mobility: Secondary | ICD-10-CM

## 2017-09-18 DIAGNOSIS — W19XXXA Unspecified fall, initial encounter: Secondary | ICD-10-CM

## 2017-09-18 MED ORDER — GADOBENATE DIMEGLUMINE 529 MG/ML IV SOLN
15.0000 mL | Freq: Once | INTRAVENOUS | Status: AC | PRN
  Administered 2017-09-18: 15 mL via INTRAVENOUS

## 2017-09-19 ENCOUNTER — Telehealth (HOSPITAL_COMMUNITY): Payer: Self-pay | Admitting: Family Medicine

## 2017-09-19 ENCOUNTER — Ambulatory Visit (HOSPITAL_COMMUNITY): Payer: Medicare Other

## 2017-09-19 ENCOUNTER — Telehealth: Payer: Self-pay | Admitting: Neurology

## 2017-09-19 NOTE — Telephone Encounter (Signed)
Called the patient and made her aware of the MRI results. Pt verbalized understanding.  

## 2017-09-19 NOTE — Telephone Encounter (Signed)
-----   Message from Anson Fret, MD sent at 09/19/2017 11:49 AM EST ----- MRI brain unremarkable

## 2017-09-19 NOTE — Telephone Encounter (Signed)
09/19/17  left message to cx - says she can't make it, thinks she caught a virus, been in bathroom all night

## 2017-09-23 ENCOUNTER — Ambulatory Visit (HOSPITAL_COMMUNITY): Payer: Medicare Other

## 2017-09-23 ENCOUNTER — Encounter (HOSPITAL_COMMUNITY): Payer: Self-pay

## 2017-09-23 DIAGNOSIS — R269 Unspecified abnormalities of gait and mobility: Secondary | ICD-10-CM

## 2017-09-23 DIAGNOSIS — M6281 Muscle weakness (generalized): Secondary | ICD-10-CM | POA: Diagnosis not present

## 2017-09-23 DIAGNOSIS — R2689 Other abnormalities of gait and mobility: Secondary | ICD-10-CM

## 2017-09-23 DIAGNOSIS — R29898 Other symptoms and signs involving the musculoskeletal system: Secondary | ICD-10-CM

## 2017-09-23 NOTE — Therapy (Signed)
[facility] [address] [place], [place], [address] Phone: [phone]   Fax:  [phone]  Physical Therapy Treatment  Patient Details  Name: Betty Golden MRN: [id number] Date of Birth: December 31, 1945 Referring Provider: Anson Fret, MD   Encounter Date: 09/23/2017  PT End of Session - 09/23/17 0824    Visit Number  2    Number of Visits  17    Date for PT Re-Evaluation  10/09/17    Authorization Type  [employer]    Authorization Time Period  09/11/17 - 11/14/17    PT Start Time  0818    PT Stop Time  0859    PT Time Calculation (min)  41 min    Activity Tolerance  Patient limited by pain;Patient tolerated treatment well    Behavior During Therapy  [facility] for tasks assessed/performed       Past Medical History:  Diagnosis Date  . Arthritis   . Asthma   . C. difficile colitis   . Cervical stenosis of spine   . Chronic headaches   . Chronic neck pain   . Depression   . Diverticulosis   . Essential hypertension   . Fibromyalgia   . GERD (gastroesophageal reflux disease)   . History of Salmonella gastroenteritis   . HNP (herniated nucleus pulposus), lumbar   . Hyperlipidemia   . IBS (irritable bowel syndrome)   . Lung nodule     Past Surgical History:  Procedure Laterality Date  . APPENDECTOMY  1965  . BREAST BIOPSY    . BREAST REDUCTION SURGERY  1994  . CHOLECYSTECTOMY  1975  . COLONOSCOPY N/A 08/19/2013   Procedure: COLONOSCOPY;  Surgeon: Malissa Hippo, MD;  [facility];  Service: Endoscopy;  Laterality: N/A;  155-moved to 140 Ann to notify pt  . LUMBAR LAMINECTOMY/DECOMPRESSION MICRODISCECTOMY Bilateral 09/20/2015   Procedure: MICRO LUMBAR DECOMPRESSION L5-S1 BILATERAL    (1 LEVEL);  Surgeon: [name], MD;  Location: [facility];  Service: Orthopedics;  Laterality: Bilateral;  . Sinus Surgery    . TONSILLECTOMY  1968    There were no vitals filed for this visit.  Subjective Assessment -  09/23/17 0820    Subjective  Pt stated the mornings are rough, had a close call but no recent falls.  Reoprts pain in lower and some thigh pain, current pain scale 8/10.    Patient Stated Goals  Being able to walk tall and straight    Currently in Pain?  Yes    Pain Score  8     Pain Location  Back    Pain Orientation  Lower;Right;Left    Pain Descriptors / Indicators  Aching;Dull heavy, deep    Pain Type  Chronic pain    Pain Radiating Towards  Pain goes down the anterior/lateral part of the legs    Pain Onset  More than a month ago    Pain Frequency  Constant    Aggravating Factors   Laying down causes pain in legs to be worse    Pain Relieving Factors  walking gives a little    Effect of Pain on Daily Activities  high impact                      [facility] Adult PT Treatment/Exercise - 09/23/17 0001      Bed Mobility   Bed Mobility  Sit to Sidelying Left    Sit to Sidelying Left  5: Supervision    Sit to Sidelying Left Details (indicate cue type and reason)  Reviewed proper mechanics for bed mobility      Exercises   Exercises  Lumbar      Lumbar Exercises: Standing   Heel Raises  10 reps    Heel Raises Limitations  Toe raises      Lumbar Exercises: Seated   Long Arc Quad on Chair  Both;10 reps    Sit to Stand  5 reps no HHA; cueing for mechanics      Lumbar Exercises: Supine   Bent Knee Raise  10 reps;3 seconds    Bridge  10 reps    Straight Leg Raise  10 reps    Other Supine Lumbar Exercises  Abduction 10x (cueing to reduce ER)             PT Education - 09/23/17 6190    Education provided  Yes    Education Details  Reviewed goals and copy of eval given to pt.  Established HEP and discussed importance of talking with PCP concerning some constipation    Person(s) Educated  Patient    Methods  Demonstration;Explanation;Handout    Comprehension  Verbalized understanding;Returned demonstration;Need further instruction       PT Short Term Goals -  09/11/17 2025      PT SHORT TERM GOAL #1   Title  Patient will demonstrate understanding and report regular compiance with HEP.     Time  4    Period  Weeks    Status  New    Target Date  10/09/17      PT SHORT TERM GOAL #2   Title  Patient will demonstrate improvement in MMT of 1/2 grade in all deficient planes in order to assist with gait and functional mobility.     Time  4    Period  Weeks    Status  New    Target Date  10/09/17      PT SHORT TERM GOAL #3   Title  Patient will perform TUG in <14 seconds indicating decreased risk of falling and improved mobility.     Time  4    Period  Weeks    Status  New        PT Long Term Goals - 09/11/17 2031      PT LONG TERM GOAL #1   Title  Patient will demonstrate improved MMT strength of 1 grade in all deficient planes in order to assist patient with gait and fucntional mobility.     Time  8    Period  Weeks    Status  New    Target Date  11/06/17      PT LONG TERM GOAL #2   Title  Patient will ambulate 300 feet on with least restrictive assistive device, improved mechanics, and without rest break indicating improved gait velocity and ambulation tolerance.     Time  8    Period  Weeks    Status  New    Target Date  11/06/17      PT LONG TERM GOAL #3   Title  Patient will demonstrate improvement on Berg Balance Test of 10 points indicating improved balance and decreased risk of falls.     Time  8    Period  Weeks    Status  New    Target Date  11/06/17            Plan - 09/23/17  5053    Clinical Impression Statement  Reviewed goals and copy of eval given to pt.  Session focus on LE strengthening to address weakness, primarly proximal strengthening.  Pt demonstrates weakness BLE with slow movements and visible muscle fatigue with task.  Established HEP with ability to demonstrate and verbalize appropraite form/technique.  EOS pt reports pain reduced, was limited by fatigue.  Discussed calling PCP to discuss  constipation with new medicaiton, pt verbalized understanding.      Rehab Potential  Fair    Clinical Impairments Affecting Rehab Potential  Positive: Patient's motivation, family support. Negative: severity of pain, medical complexity, chronicity of issue    PT Frequency  2x / week    PT Duration  8 weeks    PT Treatment/Interventions  ADLs/Self Care Home Management;Aquatic Therapy;Cryotherapy;Moist Heat;DME Instruction;Gait training;Stair training;Functional mobility training;Therapeutic activities;Therapeutic exercise;Balance training;Neuromuscular re-education;Patient/family education;Manual techniques;Passive range of motion;Dry needling;Energy conservation    PT Next Visit Plan  Follow up about B&B issues; Next session begin standing abduction, tandem stance, SLS, minisquats and progress LE strengthening and balance activities    PT Home Exercise Plan  09/11/17:  Bridge, SLR, STS, heel raises, LAQ       Patient will benefit from skilled therapeutic intervention in order to improve the following deficits and impairments:  Abnormal gait, Improper body mechanics, Pain, Decreased mobility, Postural dysfunction, Decreased activity tolerance, Decreased endurance, Decreased range of motion, Decreased strength, Hypomobility, Decreased balance, Difficulty walking, Impaired flexibility  Visit Diagnosis: Muscle weakness (generalized)  Bilateral leg weakness  Balance disorder  Gait abnormality     Problem List Patient Active Problem List   Diagnosis Date Noted  . Fibromyalgia 09/08/2017  . Spinal stenosis of lumbar region 09/20/2015  . 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 Moderate persistent asthma 07/03/2015  . Allergic rhinitis 07/03/2015  . Colitis due to Clostridium difficile 02/10/2015  . Abdominal pain 02/10/2015  . Nausea and vomiting 02/10/2015  . Acute renal failure (HCC) 02/10/2015  . Dehydration 02/10/2015  . Hypoxia 02/10/2015  . Hyponatremia 02/10/2015  . Hypokalemia 02/10/2015   . COPD (chronic obstructive pulmonary disease) (HCC)   . Unspecified constipation 07/15/2013  . DYSPNEA 04/05/2008  . Essential hypertension 03/10/2008  . Headache(784.0) 03/10/2008  . Cough 03/10/2008   Becky Sax, LPTA; CBIS [phone]  [name] 09/23/2017, 9:22 AM  [facility] [address] [place], [place], [address] Phone: [phone]   Fax:  [phone]  Name: GURTHA PICKER MRN: [id number] Date of Birth: 07/12/1946

## 2017-09-23 NOTE — Patient Instructions (Addendum)
Bridge    Lie back, legs bent. Inhale, pressing hips up. Keeping ribs in, lengthen lower back. Exhale, rolling down along spine from top. Repeat 10 times. Do 1-2 sessions per day.  http://pm.exer.us/55   Copyright  VHI. All rights reserved.   HIP: Flexion / KNEE: Extension, Straight Leg Raise    Raise leg, keeping knee straight. Perform slowly. 10 reps per set, 1-2 sets per day.  Copyright  VHI. All rights reserved.   Functional Quadriceps: Sit to Stand    Sit on edge of chair, feet flat on floor. Stand upright, extending knees fully. Repeat 10 times per set. Do 1-2 sets per session.   http://orth.exer.us/735   Copyright  VHI. All rights reserved.   Long [employer]    Straighten operated leg and try to hold it 3-5 seconds.  Repeat 10 times. Do 1-2 sessions a day.  http://gt2.exer.us/311   Copyright  VHI. All rights reserved.

## 2017-09-26 ENCOUNTER — Encounter (HOSPITAL_COMMUNITY): Payer: Self-pay | Admitting: Physical Therapy

## 2017-09-26 ENCOUNTER — Ambulatory Visit (HOSPITAL_COMMUNITY): Payer: Medicare Other | Attending: Neurology | Admitting: Physical Therapy

## 2017-09-26 DIAGNOSIS — R2689 Other abnormalities of gait and mobility: Secondary | ICD-10-CM

## 2017-09-26 DIAGNOSIS — R262 Difficulty in walking, not elsewhere classified: Secondary | ICD-10-CM

## 2017-09-26 DIAGNOSIS — R269 Unspecified abnormalities of gait and mobility: Secondary | ICD-10-CM | POA: Diagnosis present

## 2017-09-26 DIAGNOSIS — M6281 Muscle weakness (generalized): Secondary | ICD-10-CM | POA: Diagnosis present

## 2017-09-26 DIAGNOSIS — R29898 Other symptoms and signs involving the musculoskeletal system: Secondary | ICD-10-CM | POA: Diagnosis present

## 2017-09-26 NOTE — Therapy (Signed)
[facility] [address] [place], [place], [address] Phone: [phone]   Fax:  [phone]  Physical Therapy Treatment  Patient Details  Name: Betty Golden MRN: [id number] Date of Birth: 1946-02-05 Referring Provider: Anson Fret, MD   Encounter Date: 09/26/2017  PT End of Session - 09/26/17 0958    Visit Number  3    Number of Visits  17    Date for PT Re-Evaluation  10/09/17    Authorization Type  [employer]    Authorization Time Period  09/11/17 - 11/14/17    PT Start Time  0818    PT Stop Time  0900    PT Time Calculation (min)  42 min    Equipment Utilized During Treatment  Gait belt    Activity Tolerance  Patient limited by pain;Patient tolerated treatment well    Behavior During Therapy  [facility] for tasks assessed/performed       Past Medical History:  Diagnosis Date  . Arthritis   . Asthma   . C. difficile colitis   . Cervical stenosis of spine   . Chronic headaches   . Chronic neck pain   . Depression   . Diverticulosis   . Essential hypertension   . Fibromyalgia   . GERD (gastroesophageal reflux disease)   . History of Salmonella gastroenteritis   . HNP (herniated nucleus pulposus), lumbar   . Hyperlipidemia   . IBS (irritable bowel syndrome)   . Lung nodule     Past Surgical History:  Procedure Laterality Date  . APPENDECTOMY  1965  . BREAST BIOPSY    . BREAST REDUCTION SURGERY  1994  . CHOLECYSTECTOMY  1975  . COLONOSCOPY N/A 08/19/2013   Procedure: COLONOSCOPY;  Surgeon: Malissa Hippo, MD;  [facility];  Service: Endoscopy;  Laterality: N/A;  155-moved to 140 Ann to notify pt  . LUMBAR LAMINECTOMY/DECOMPRESSION MICRODISCECTOMY Bilateral 09/20/2015   Procedure: MICRO LUMBAR DECOMPRESSION L5-S1 BILATERAL    (1 LEVEL);  Surgeon: [name], MD;  [facility];  Service: Orthopedics;  Laterality: Bilateral;  . Sinus Surgery    . TONSILLECTOMY  1968    There were no vitals filed  for this visit.  Subjective Assessment - 09/26/17 0906    Subjective  Patient reported that she is having 8/10 pain this session in her shoulders and in both legs. Patient reported her legs gave out once since last session, but she did not fall.     Patient Stated Goals  Being able to walk tall and straight    Currently in Pain?  Yes    Pain Score  8     Pain Location  Leg    Pain Orientation  Left;Right    Pain Descriptors / Indicators  Aching    Pain Type  Other (Comment) History of fibromyalgia with moving pain    Multiple Pain Sites  Yes    Pain Score  8    Pain Location  Shoulder    Pain Orientation  Left;Right    Pain Descriptors / Indicators  Aching    Pain Type  -- History of fibromyalgia with moving pain                      [facility] Adult PT Treatment/Exercise - 09/26/17 0001      Bed Mobility   Bed Mobility  Right Sidelying to Sit    Right Sidelying to Sit  5: Supervision  Right Sidelying to Sit Details (indicate cue type and reason)  Reviewed proper mechanics for bed mobility      Lumbar Exercises: Standing   Functional Squats  10 reps;Other (comment) Bilateral upper extremity assist verbal cues to sit back       Lumbar Exercises: Seated   Long Arc Quad on Chair  Both;10 reps    Sit to Stand  10 reps 2x5 reps from 21 inch mat without upper extremity assist      Lumbar Exercises: Supine   Bent Knee Raise  10 reps;3 seconds;Other (comment) Each lower extremity    Bridge  10 reps;2 seconds    Straight Leg Raise  10 reps;Other (comment) Each lower extremity    Other Supine Lumbar Exercises  Abduction 10x (cueing to reduce ER)          Balance Exercises - 09/26/17 0951      Balance Exercises: Standing   Standing Eyes Opened  Narrow base of support (BOS);Foam/compliant surface;Other reps (comment) 7 repetitions: 30, 18, 12, 4, 18, 5, 20 seconds    Standing Eyes Closed  Narrow base of support (BOS);Solid surface;5 reps;Other (comment) About 10-15  seconds each    Tandem Stance  Eyes open;Other reps (comment) Solid surface. 10 repetitions each LE. Rt. 8 sec max; Lt. 7    SLS  Eyes open;Solid surface;5 reps;Other reps (comment) 5 reps each LE for 1-2 seconds each side        PT Education - 09/26/17 0957    Education provided  Yes    Education Details  This session did review patient's HEP exercises as patient continued to require verbal and tactile cues to perform. Patient was educated on purpose and technique of exercises throghout session.     Person(s) Educated  Patient    Methods  Explanation    Comprehension  Verbalized understanding;Returned demonstration;Need further instruction;Tactile cues required       PT Short Term Goals - 09/11/17 2025      PT SHORT TERM GOAL #1   Title  Patient will demonstrate understanding and report regular compiance with HEP.     Time  4    Period  Weeks    Status  New    Target Date  10/09/17      PT SHORT TERM GOAL #2   Title  Patient will demonstrate improvement in MMT of 1/2 grade in all deficient planes in order to assist with gait and functional mobility.     Time  4    Period  Weeks    Status  New    Target Date  10/09/17      PT SHORT TERM GOAL #3   Title  Patient will perform TUG in <14 seconds indicating decreased risk of falling and improved mobility.     Time  4    Period  Weeks    Status  New        PT Long Term Goals - 09/11/17 2031      PT LONG TERM GOAL #1   Title  Patient will demonstrate improved MMT strength of 1 grade in all deficient planes in order to assist patient with gait and fucntional mobility.     Time  8    Period  Weeks    Status  New    Target Date  11/06/17      PT LONG TERM GOAL #2   Title  Patient will ambulate 300 feet on with least restrictive assistive device,  improved mechanics, and without rest break indicating improved gait velocity and ambulation tolerance.     Time  8    Period  Weeks    Status  New    Target Date  11/06/17       PT LONG TERM GOAL #3   Title  Patient will demonstrate improvement on Berg Balance Test of 10 points indicating improved balance and decreased risk of falls.     Time  8    Period  Weeks    Status  New    Target Date  11/06/17            Plan - 09/26/17 0959    Clinical Impression Statement  This session continued to progress patient with lower extremity strengthening exercises and began to incorporate standing balance activities. This session added tandem balance which patient reported was a little easier with the right lower extremity forward. Also added NBOS balance with eyes closed on a solid surface which patient demonstrated some sway throughout. Also added NBOS on foam which patient could maintain for a maximum of 30 seconds although patient reported that she was very wobbly with this. Patient performed single leg stance this session for a maximum of 2 seconds on each leg. This session also added functional squats with upper extremity support. Patient reported that she spoke to her physician regarding her constipation and patient reported that they suggested Miralax and did not seem concerned. Patient would benefit from continued skilled physical therapy in order to continue addressing deficits in strength, balance, gait, and overall functional mobility.     Rehab Potential  Fair    Clinical Impairments Affecting Rehab Potential  Positive: Patient's motivation, family support. Negative: severity of pain, medical complexity, chronicity of issue    PT Frequency  2x / week    PT Duration  8 weeks    PT Treatment/Interventions  ADLs/Self Care Home Management;Aquatic Therapy;Cryotherapy;Moist Heat;DME Instruction;Gait training;Stair training;Functional mobility training;Therapeutic activities;Therapeutic exercise;Balance training;Neuromuscular re-education;Patient/family education;Manual techniques;Passive range of motion;Dry needling;Energy conservation    PT Next Visit Plan  Next  session begin standing abduction if patient is able to maintain toes forward, continue balance activities, minisquats and progress LE strengthening. Begin gait training and focus on posture with functional activities.     PT Home Exercise Plan  09/11/17:  Bridge, SLR, STS, heel raises, LAQ       Patient will benefit from skilled therapeutic intervention in order to improve the following deficits and impairments:  Abnormal gait, Improper body mechanics, Pain, Decreased mobility, Postural dysfunction, Decreased activity tolerance, Decreased endurance, Decreased range of motion, Decreased strength, Hypomobility, Decreased balance, Difficulty walking, Impaired flexibility  Visit Diagnosis: Muscle weakness (generalized)  Bilateral leg weakness  Balance disorder  Difficulty in walking, not elsewhere classified     Problem List Patient Active Problem List   Diagnosis Date Noted  . Fibromyalgia 09/08/2017  . Spinal stenosis of lumbar region 09/20/2015  . 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 Moderate persistent asthma 07/03/2015  . Allergic rhinitis 07/03/2015  . Colitis due to Clostridium difficile 02/10/2015  . Abdominal pain 02/10/2015  . Nausea and vomiting 02/10/2015  . Acute renal failure (HCC) 02/10/2015  . Dehydration 02/10/2015  . Hypoxia 02/10/2015  . Hyponatremia 02/10/2015  . Hypokalemia 02/10/2015  . COPD (chronic obstructive pulmonary disease) (HCC)   . Unspecified constipation 07/15/2013  . DYSPNEA 04/05/2008  . Essential hypertension 03/10/2008  . Headache(784.0) 03/10/2008  . Cough 03/10/2008   Verne Carrow PT, DPT 10:15 AM, 09/26/17 [phone]  [facility] [address] [place], [place], [address] Phone: [phone]   Fax:  [phone]  Name: THOMAS RHUDE MRN: [id number] Date of Birth: [birth year]

## 2017-09-30 ENCOUNTER — Telehealth (HOSPITAL_COMMUNITY): Payer: Self-pay | Admitting: Family Medicine

## 2017-09-30 ENCOUNTER — Ambulatory Visit (HOSPITAL_COMMUNITY): Payer: Medicare Other

## 2017-09-30 NOTE — Telephone Encounter (Signed)
09/30/17  daughter left a message that her mom was with her and her grandson in the ER all night but will be here at the next appt.

## 2017-10-01 ENCOUNTER — Ambulatory Visit (HOSPITAL_COMMUNITY): Payer: Medicare Other

## 2017-10-01 ENCOUNTER — Encounter (HOSPITAL_COMMUNITY): Payer: Self-pay

## 2017-10-01 DIAGNOSIS — R269 Unspecified abnormalities of gait and mobility: Secondary | ICD-10-CM

## 2017-10-01 DIAGNOSIS — M6281 Muscle weakness (generalized): Secondary | ICD-10-CM | POA: Diagnosis not present

## 2017-10-01 DIAGNOSIS — R2689 Other abnormalities of gait and mobility: Secondary | ICD-10-CM

## 2017-10-01 DIAGNOSIS — R29898 Other symptoms and signs involving the musculoskeletal system: Secondary | ICD-10-CM

## 2017-10-01 NOTE — Therapy (Signed)
[place] [facility] [address] [place], [place], [address] Phone: [phone]   Fax:  [phone]  Physical Therapy Treatment  Patient Details  Name: Betty Golden MRN: [id number] Date of Birth: January 20, 1946 Referring Provider: Anson Fret, MD   Encounter Date: 10/01/2017  PT End of Session - 10/01/17 0943    Visit Number  4    Number of Visits  17    Date for PT Re-Evaluation  10/09/17    Authorization Type  [employer]    Authorization Time Period  09/11/17 - 11/14/17    PT Start Time  0903    PT Stop Time  0945    PT Time Calculation (min)  42 min    Equipment Utilized During Treatment  Gait belt    Activity Tolerance  Patient limited by pain;Patient tolerated treatment well;Patient limited by fatigue    Behavior During Therapy  [facility] for tasks assessed/performed       Past Medical History:  Diagnosis Date  . Arthritis   . Asthma   . C. difficile colitis   . Cervical stenosis of spine   . Chronic headaches   . Chronic neck pain   . Depression   . Diverticulosis   . Essential hypertension   . Fibromyalgia   . GERD (gastroesophageal reflux disease)   . History of Salmonella gastroenteritis   . HNP (herniated nucleus pulposus), lumbar   . Hyperlipidemia   . IBS (irritable bowel syndrome)   . Lung nodule     Past Surgical History:  Procedure Laterality Date  . APPENDECTOMY  1965  . BREAST BIOPSY    . BREAST REDUCTION SURGERY  1994  . CHOLECYSTECTOMY  1975  . COLONOSCOPY N/A 08/19/2013   Procedure: COLONOSCOPY;  Surgeon: Malissa Hippo, MD;  [facility];  Service: Endoscopy;  Laterality: N/A;  155-moved to 140 Ann to notify pt  . LUMBAR LAMINECTOMY/DECOMPRESSION MICRODISCECTOMY Bilateral 09/20/2015   Procedure: MICRO LUMBAR DECOMPRESSION L5-S1 BILATERAL    (1 LEVEL);  Surgeon: [name], MD;  Location: [facility];  Service: Orthopedics;  Laterality: Bilateral;  . Sinus Surgery    . TONSILLECTOMY  1968     There were no vitals filed for this visit.  Subjective Assessment - 10/01/17 0908    Subjective  Pt stated she is stiff this morning.  Continues to have difficulty getting out of bed or standing following sitting for any period of time.  Reports her legs have given out about 5 times since last PT session, no reports of recent falls.      Patient Stated Goals  Being able to walk tall and straight    Currently in Pain?  Yes    Pain Score  6     Pain Location  Leg    Pain Orientation  Right;Left    Pain Descriptors / Indicators  Aching;Sore feels like getting punched    Pain Type  -- Hx of fibromyalgia with moving pain    Pain Radiating Towards  pain goes down the anterior/ lateral part of the legs    Pain Onset  More than a month ago    Pain Frequency  Constant    Aggravating Factors   laying down causes pain in legs to be worse    Pain Relieving Factors  walking gives a little    Effect of Pain on Daily Activities  high impact  [facility] Adult PT Treatment/Exercise - 10/01/17 0001      Lumbar Exercises: Standing   Functional Squats  10 reps front of chair, no HHA    Other Standing Lumbar Exercises  226 ft with SPC; cueing for posture and to equalize stride length    Other Standing Lumbar Exercises  hip abduction 10x each; therapist facilitaiton to reduce side bend      Lumbar Exercises: Seated   Sit to Stand  10 reps no HHA; eccentric control      Lumbar Exercises: Supine   Bridge  10 reps;2 seconds 2 sets      Lumbar Exercises: Sidelying   Hip Abduction  Both;10 reps 2 sets; back against mirror          Balance Exercises - 10/01/17 0941      Balance Exercises: Standing   Standing Eyes Opened  Narrow base of support (BOS);3 reps;Foam/compliant surface    Tandem Stance  Eyes open;3 reps;30 secs    SLS  Eyes open;5 reps    Rockerboard  Anterior/posterior;Lateral;Intermittent UE support          PT Short Term Goals - 09/11/17 2025       PT SHORT TERM GOAL #1   Title  Patient will demonstrate understanding and report regular compiance with HEP.     Time  4    Period  Weeks    Status  New    Target Date  10/09/17      PT SHORT TERM GOAL #2   Title  Patient will demonstrate improvement in MMT of 1/2 grade in all deficient planes in order to assist with gait and functional mobility.     Time  4    Period  Weeks    Status  New    Target Date  10/09/17      PT SHORT TERM GOAL #3   Title  Patient will perform TUG in <14 seconds indicating decreased risk of falling and improved mobility.     Time  4    Period  Weeks    Status  New        PT Long Term Goals - 09/11/17 2031      PT LONG TERM GOAL #1   Title  Patient will demonstrate improved MMT strength of 1 grade in all deficient planes in order to assist patient with gait and fucntional mobility.     Time  8    Period  Weeks    Status  New    Target Date  11/06/17      PT LONG TERM GOAL #2   Title  Patient will ambulate 300 feet on with least restrictive assistive device, improved mechanics, and without rest break indicating improved gait velocity and ambulation tolerance.     Time  8    Period  Weeks    Status  New    Target Date  11/06/17      PT LONG TERM GOAL #3   Title  Patient will demonstrate improvement on Berg Balance Test of 10 points indicating improved balance and decreased risk of falls.     Time  8    Period  Weeks    Status  New    Target Date  11/06/17            Plan - 10/01/17 1322    Clinical Impression Statement  Continued session foucs with proximal strengthening and balance training.  Progressed hip strengthening with new exercises with  therapist facilitation to ensure correct muscle and reduce compensation (standing sidebend, sidelying posterior roll) for glut med abduction strengthening.  Min A for balance activities for safety and min cueing to improve focus to assist with balance activities.  Pt tolerated well to  session with no reports of pain through session, was limited by fatigue with tasks.      Rehab Potential  Fair    Clinical Impairments Affecting Rehab Potential  Positive: Patient's motivation, family support. Negative: severity of pain, medical complexity, chronicity of issue    PT Frequency  2x / week    PT Duration  8 weeks    PT Treatment/Interventions  ADLs/Self Care Home Management;Aquatic Therapy;Cryotherapy;Moist Heat;DME Instruction;Gait training;Stair training;Functional mobility training;Therapeutic activities;Therapeutic exercise;Balance training;Neuromuscular re-education;Patient/family education;Manual techniques;Passive range of motion;Dry needling;Energy conservation    PT Next Visit Plan  Next session begin sidestepping ensuring toe maintain forward.  Add postural strengthening to imrpove awareness wiht balance activities and functional strengthening (minisquats, heel/toe raises and step ups).      PT Home Exercise Plan  09/11/17:  Bridge, SLR, STS, heel raises, LAQ       Patient will benefit from skilled therapeutic intervention in order to improve the following deficits and impairments:  Abnormal gait, Improper body mechanics, Pain, Decreased mobility, Postural dysfunction, Decreased activity tolerance, Decreased endurance, Decreased range of motion, Decreased strength, Hypomobility, Decreased balance, Difficulty walking, Impaired flexibility  Visit Diagnosis: Muscle weakness (generalized)  Bilateral leg weakness  Balance disorder  Gait abnormality     Problem List Patient Active Problem List   Diagnosis Date Noted  . Fibromyalgia 09/08/2017  . Spinal stenosis of lumbar region 09/20/2015  . 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 Moderate persistent asthma 07/03/2015  . Allergic rhinitis 07/03/2015  . Colitis due to Clostridium difficile 02/10/2015  . Abdominal pain 02/10/2015  . Nausea and vomiting 02/10/2015  . Acute renal failure (HCC) 02/10/2015  . Dehydration  02/10/2015  . Hypoxia 02/10/2015  . Hyponatremia 02/10/2015  . Hypokalemia 02/10/2015  . COPD (chronic obstructive pulmonary disease) (HCC)   . Unspecified constipation 07/15/2013  . DYSPNEA 04/05/2008  . Essential hypertension 03/10/2008  . Headache(784.0) 03/10/2008  . Cough 03/10/2008   Becky Sax, LPTA; CBIS [phone]  [name] 10/01/2017, 5:25 PM  [place] [facility] [address] [place], [place], [address] Phone: [phone]   Fax:  [phone]  Name: AVALENE SEALY MRN: [id number] Date of Birth: [birth year]

## 2017-10-07 ENCOUNTER — Ambulatory Visit: Payer: Self-pay

## 2017-10-08 ENCOUNTER — Ambulatory Visit (HOSPITAL_COMMUNITY): Payer: Medicare Other | Admitting: Physical Therapy

## 2017-10-08 ENCOUNTER — Encounter (HOSPITAL_COMMUNITY): Payer: Self-pay | Admitting: Physical Therapy

## 2017-10-08 DIAGNOSIS — R2689 Other abnormalities of gait and mobility: Secondary | ICD-10-CM

## 2017-10-08 DIAGNOSIS — M6281 Muscle weakness (generalized): Secondary | ICD-10-CM

## 2017-10-08 DIAGNOSIS — R269 Unspecified abnormalities of gait and mobility: Secondary | ICD-10-CM

## 2017-10-08 DIAGNOSIS — R29898 Other symptoms and signs involving the musculoskeletal system: Secondary | ICD-10-CM

## 2017-10-08 NOTE — Therapy (Deleted)
[place] [facility] [address] [place], [place], [address] Phone: [phone]   Fax:  [phone]  Pediatric Physical Therapy Treatment  Patient Details  Name: Betty Golden MRN: [id number] Date of Birth: May 03, 1946 No Data Recorded  Encounter date: 10/08/2017    Past Medical History:  Diagnosis Date  . Arthritis   . Asthma   . C. difficile colitis   . Cervical stenosis of spine   . Chronic headaches   . Chronic neck pain   . Depression   . Diverticulosis   . Essential hypertension   . Fibromyalgia   . GERD (gastroesophageal reflux disease)   . History of Salmonella gastroenteritis   . HNP (herniated nucleus pulposus), lumbar   . Hyperlipidemia   . IBS (irritable bowel syndrome)   . Lung nodule     Past Surgical History:  Procedure Laterality Date  . APPENDECTOMY  1965  . BREAST BIOPSY    . BREAST REDUCTION SURGERY  1994  . CHOLECYSTECTOMY  1975  . COLONOSCOPY N/A 08/19/2013   Procedure: COLONOSCOPY;  Surgeon: Malissa Hippo, MD;  [facility];  Service: Endoscopy;  Laterality: N/A;  155-moved to 140 Ann to notify pt  . LUMBAR LAMINECTOMY/DECOMPRESSION MICRODISCECTOMY Bilateral 09/20/2015   Procedure: MICRO LUMBAR DECOMPRESSION L5-S1 BILATERAL    (1 LEVEL);  Surgeon: [name], MD;  Location[facility];  Service: Orthopedics;  Laterality: Bilateral;  . Sinus Surgery    . TONSILLECTOMY  1968    There were no vitals filed for this visit.     [facility] PT Assessment - 10/08/17 0001      Observation/Other Assessments   Observations  Patient demonstrated increased kyphosis and forward flexed posture with walking    Focus on Therapeutic Outcomes (FOTO)   16% (84% limited)      AROM   Overall AROM Comments  All lumbar range of motion patient reported pain    Lumbar Flexion  Moderately limited    Lumbar Extension  Severely limited    Lumbar - Right Side Bend  Severely limited    Lumbar - Left Side Bend  Severely  limited    Lumbar - Right Rotation  Severely limited    Lumbar - Left Rotation  Severely limited      Strength   Right Hip Flexion  4-/5 was 3/5    Right Hip Extension  2+/5 was 2+/5    Right Hip ABduction  4-/5 was 3+/5    Left Hip Flexion  4-/5 was 3/5 painful    Left Hip Extension  2-/5 was 2-/5    Left Hip ABduction  4-/5 was 3+/5    Right Knee Flexion  4/5 was 2+/5    Right Knee Extension  4/5 was 3+/5    Left Knee Flexion  4/5 was 3-/5    Left Knee Extension  4/5 was 3+/5    Right Ankle Dorsiflexion  4+/5 was 3+/5    Right Ankle Plantar Flexion  3/5 Was 2/5    Left Ankle Dorsiflexion  4+/5 was 3+/5    Left Ankle Plantar Flexion  3/5 Was 2/5      Palpation   Spinal mobility  General hypomobility throughout    Palpation comment  Noted increased muscular restrictions in lumbar paraspinals      Ambulation/Gait   Ambulation/Gait  Yes    Ambulation/Gait Assistance  4: Min guard    Assistive device  None    Gait Pattern  Decreased arm  swing - right;Decreased arm swing - left;Decreased stride length;Antalgic;Decreased dorsiflexion - right;Decreased dorsiflexion - left;Decreased hip/knee flexion - left;Decreased hip/knee flexion - right;Trunk flexed    Ambulation Surface  Level      Berg Balance Test   Sit to Stand  Able to stand without using hands and stabilize independently    Standing Unsupported  Able to stand 2 minutes with supervision    Sitting with Back Unsupported but Feet Supported on Floor or Stool  Able to sit safely and securely 2 minutes    Stand to Sit  Sits safely with minimal use of hands    Transfers  Able to transfer safely, definite need of hands    Standing Unsupported with Eyes Closed  Able to stand 10 seconds safely    Standing Ubsupported with Feet Together  Able to place feet together independently and stand for 1 minute with supervision    From Standing, Reach Forward with Outstretched Arm  Can reach forward >12 cm safely (5")    From Standing Position,  Pick up Object from Floor  Able to pick up shoe, needs supervision    From Standing Position, Turn to Look Behind Over each Shoulder  Turn sideways only but maintains balance    Turn 360 Degrees  Able to turn 360 degrees safely but slowly    Standing Unsupported, Alternately Place Feet on Step/Stool  Able to stand independently and complete 8 steps >20 seconds    Standing Unsupported, One Foot in [employer] balance while stepping or standing    Standing on One Leg  Tries to lift leg/unable to hold 3 seconds but remains standing independently    Total Score  39    Berg comment:  Was 32      Timed Up and Go Test   TUG  Normal TUG    Normal TUG (seconds)  16.68    TUG Comments  Without use of hands or cane                 [facility] Adult PT Treatment/Exercise - 10/08/17 0001      Ambulation/Gait   Ambulation Distance (Feet)  274 Feet with no break    Gait velocity  0.696 m/s      Lumbar Exercises: Standing   Functional Squats  10 reps      Lumbar Exercises: Seated   Sit to Stand  10 reps;Other (comment) no use of upper extremities      Lumbar Exercises: Supine   Bridge  10 reps;2 seconds;Other (comment) 2 sets      Knee/Hip Exercises: Standing   Hip Extension  Stengthening;Right;Left;1 set;10 reps;Knee straight          Balance Exercises - 10/08/17 0845      Balance Exercises: Standing   Standing Eyes Opened  Narrow base of support (BOS);3 reps;Foam/compliant surface    Tandem Stance  Eyes open;3 reps;30 secs    SLS  Eyes open;5 reps;Other reps (comment) 5 reps each lower extremity                Patient will benefit from skilled therapeutic intervention in order to improve the following deficits and impairments:     Visit Diagnosis: Muscle weakness (generalized)  Bilateral leg weakness  Balance disorder  Gait abnormality   Problem List Patient Active Problem List   Diagnosis Date Noted  . Fibromyalgia 09/08/2017  . Spinal stenosis of  lumbar region 09/20/2015  . 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 Moderate  persistent asthma 07/03/2015  . Allergic rhinitis 07/03/2015  . Colitis due to Clostridium difficile 02/10/2015  . Abdominal pain 02/10/2015  . Nausea and vomiting 02/10/2015  . Acute renal failure (HCC) 02/10/2015  . Dehydration 02/10/2015  . Hypoxia 02/10/2015  . Hyponatremia 02/10/2015  . Hypokalemia 02/10/2015  . COPD (chronic obstructive pulmonary disease) (HCC)   . Unspecified constipation 07/15/2013  . DYSPNEA 04/05/2008  . Essential hypertension 03/10/2008  . Headache(784.0) 03/10/2008  . Cough 03/10/2008    Verne Carrow 10/08/2017, 11:57 AM  [facility] [address] [place], [place], [address] Phone: [phone]   Fax:  [phone]  Name: Betty Golden MRN: [id number] Date of Birth: 1945-11-12

## 2017-10-08 NOTE — Therapy (Addendum)
[place], [place], [address] Phone: [phone]   Fax:  [phone]  Physical Therapy Treatment / Re-assessment  Patient Details  Name: Betty Golden MRN: [id number] Date of Birth: 03-29-1946 Referring Provider: Melvenia Beam, MD   Encounter Date: 10/08/2017  PT End of Session - 10/08/17 0819    Visit Number  5    Number of Visits  17    Date for PT Re-Evaluation  11/14/2017    Authorization Type  [employer]    Authorization Time Period  09/11/17 - 11/14/17    PT Start Time  0817    PT Stop Time  0900 Some time unbilled for completing re-assessment    PT Time Calculation (min)  43 min    Equipment Utilized During Treatment  Gait belt    Activity Tolerance  Patient limited by pain;Patient tolerated treatment well;Patient limited by fatigue    Behavior During Therapy  [facility] for tasks assessed/performed       Past Medical History:  Diagnosis Date  . Arthritis   . Asthma   . C. difficile colitis   . Cervical stenosis of spine   . Chronic headaches   . Chronic neck pain   . Depression   . Diverticulosis   . Essential hypertension   . Fibromyalgia   . GERD (gastroesophageal reflux disease)   . History of Salmonella gastroenteritis   . HNP (herniated nucleus pulposus), lumbar   . Hyperlipidemia   . IBS (irritable bowel syndrome)   . Lung nodule     Past Surgical History:  Procedure Laterality Date  . APPENDECTOMY  1965  . BREAST BIOPSY    . BREAST REDUCTION SURGERY  1994  . CHOLECYSTECTOMY  1975  . COLONOSCOPY N/A 08/19/2013   Procedure: COLONOSCOPY;  Surgeon: Rogene Houston, MD;  [facility];  Service: Endoscopy;  Laterality: N/A;  155-moved to 140 Ann to notify pt  . LUMBAR LAMINECTOMY/DECOMPRESSION MICRODISCECTOMY Bilateral 09/20/2015   Procedure: MICRO LUMBAR DECOMPRESSION L5-S1 BILATERAL    (1 LEVEL);  Surgeon: Susa Day, MD;  Location: [facility];  Service: Orthopedics;   Laterality: Bilateral;  . Sinus Surgery    . TONSILLECTOMY  1968    There were no vitals filed for this visit.  Subjective Assessment - 10/08/17 0900    Subjective  Patient reported having a 6/10 pain this session in her back and legs. Patient stated she is doing some of her home exercises.     How long can you sit comfortably?  10 minutes    How long can you stand comfortably?  5 minutes    How long can you walk comfortably?  2 minutes    Patient Stated Goals  Being able to walk tall and straight    Currently in Pain?  Yes    Pain Score  6     Pain Location  Back    Pain Descriptors / Indicators  Aching    Pain Radiating Towards  pain goes down legs    Pain Onset  More than a month ago    Multiple Pain Sites  No         [facility] PT Assessment - 10/08/17 0001      Observation/Other Assessments   Observations  Patient demonstrated increased kyphosis and forward flexed posture with walking    Focus on Therapeutic Outcomes (FOTO)   16% (84% limited)      AROM   Overall AROM  Comments  All lumbar range of motion patient reported pain    Lumbar Flexion  Moderately limited    Lumbar Extension  Severely limited    Lumbar - Right Side Bend  Severely limited    Lumbar - Left Side Bend  Severely limited    Lumbar - Right Rotation  Severely limited    Lumbar - Left Rotation  Severely limited      Strength   Right Hip Flexion  4-/5 was 3/5    Right Hip Extension  2+/5 was 2+/5    Right Hip ABduction  4-/5 was 3+/5    Left Hip Flexion  4-/5 was 3/5 painful    Left Hip Extension  2-/5 was 2-/5    Left Hip ABduction  4-/5 was 3+/5    Right Knee Flexion  4/5 was 2+/5    Right Knee Extension  4/5 was 3+/5    Left Knee Flexion  4/5 was 3-/5    Left Knee Extension  4/5 was 3+/5    Right Ankle Dorsiflexion  4+/5 was 3+/5    Right Ankle Plantar Flexion  3/5 Was 2/5    Left Ankle Dorsiflexion  4+/5 was 3+/5    Left Ankle Plantar Flexion  3/5 Was 2/5      Palpation   Spinal mobility   General hypomobility throughout    Palpation comment  Noted increased muscular restrictions in lumbar paraspinals      Ambulation/Gait   Ambulation/Gait  Yes    Ambulation/Gait Assistance  4: Min guard    Assistive device  None    Gait Pattern  Decreased arm swing - right;Decreased arm swing - left;Decreased stride length;Antalgic;Decreased dorsiflexion - right;Decreased dorsiflexion - left;Decreased hip/knee flexion - left;Decreased hip/knee flexion - right;Trunk flexed    Ambulation Surface  Level      Berg Balance Test   Sit to Stand  Able to stand without using hands and stabilize independently    Standing Unsupported  Able to stand 2 minutes with supervision    Sitting with Back Unsupported but Feet Supported on Floor or Stool  Able to sit safely and securely 2 minutes    Stand to Sit  Sits safely with minimal use of hands    Transfers  Able to transfer safely, definite need of hands    Standing Unsupported with Eyes Closed  Able to stand 10 seconds safely    Standing Ubsupported with Feet Together  Able to place feet together independently and stand for 1 minute with supervision    From Standing, Reach Forward with Outstretched Arm  Can reach forward >12 cm safely (5")    From Standing Position, Pick up Object from Floor  Able to pick up shoe, needs supervision    From Standing Position, Turn to Look Behind Over each Shoulder  Turn sideways only but maintains balance    Turn 360 Degrees  Able to turn 360 degrees safely but slowly    Standing Unsupported, Alternately Place Feet on Step/Stool  Able to stand independently and complete 8 steps >20 seconds    Standing Unsupported, One Foot in [employer] balance while stepping or standing    Standing on One Leg  Tries to lift leg/unable to hold 3 seconds but remains standing independently    Total Score  39    Berg comment:  Was 32      Timed Up and Go Test   TUG  Normal TUG    Normal TUG (seconds)  16.68    TUG Comments  Without use  of hands or cane                  [facility] Adult PT Treatment/Exercise - 10/08/17 0001      Ambulation/Gait   Ambulation Distance (Feet)  274 Feet 2MWT with no break    Gait velocity  0.696 m/s      Lumbar Exercises: Standing   Functional Squats  10 reps      Lumbar Exercises: Seated   Sit to Stand  10 reps;Other (comment) no use of upper extremities      Lumbar Exercises: Supine   Bridge  10 reps;2 seconds;Other (comment) 2 sets      Knee/Hip Exercises: Standing   Hip Extension  Stengthening;Right;Left;1 set;10 reps;Knee straight          Balance Exercises - 10/08/17 0845      Balance Exercises: Standing   Standing Eyes Opened  Narrow base of support (BOS);3 reps;Foam/compliant surface    Tandem Stance  Eyes open;3 reps;30 secs    SLS  Eyes open;5 reps;Other reps (comment) 5 reps each lower extremity        PT Education - 10/08/17 1155    Education provided  Yes    Education Details  Patient was educated on re-assessment findings, as well as purpose and form of exercises throughout session.    Person(s) Educated  Patient    Methods  Explanation    Comprehension  Verbalized understanding;Returned demonstration       PT Short Term Goals - 10/08/17 1139      PT SHORT TERM GOAL #1   Title  Patient will demonstrate understanding and report regular compliance with HEP.     Time  4    Period  Weeks    Status  On-going      PT SHORT TERM GOAL #2   Title  Patient will demonstrate improvement in MMT of 1/2 grade in all deficient planes in order to assist with gait and functional mobility.     Baseline  10/08/17: Patient made improvements in MMT testing of 1/2 grade in all deficient planes except right hip extension and left hip extension.     Time  4    Period  Weeks    Status  Partially Met      PT SHORT TERM GOAL #3   Title  Patient will perform TUG in <14 seconds indicating decreased risk of falling and improved mobility.     Baseline  10/08/17: Patient  performed TUG in 16.68 seconds    Time  4    Period  Weeks    Status  On-going        PT Long Term Goals - 10/08/17 1142      PT LONG TERM GOAL #1   Title  Patient will demonstrate improved MMT strength of 1 grade in all deficient planes in order to assist patient with gait and fucntional mobility.     Baseline  10/08/17: Patient has made some improvements in strength, but has not improved by 1 grade in all deficient areas.     Time  8    Period  Weeks    Status  Partially Met      PT LONG TERM GOAL #2   Title  Patient will ambulate 300 feet on 2MWT with least restrictive assistive device, improved mechanics, and without rest break indicating improved gait velocity and ambulation tolerance.     Baseline  10/08/17: Patient improved to ambulating 274 feet this session without a cane and without rest breaks.     Time  8    Period  Weeks    Status  Partially Met      PT LONG TERM GOAL #3   Title  Patient will demonstrate improvement on Berg Balance Test of 10 points indicating improved balance and decreased risk of falls.     Baseline  10/08/17: Patient improved by 7 points on the Berg this session    Time  8    Period  Weeks    Status  On-going            Plan - 10/08/17 1151    Clinical Impression Statement  This session performed a re-assessment of patient's progress toward goals. Patient partially met 1 out of 3 of her short term goals, and partially met 2 out of 3 of her long term goals. Patient demonstrated improved strength, ambulation distance, and performance of balance tests this session. The remainder of the session focused on lower extremity strengthening, functional strengthening and balance. Future sessions should focus on improving patient's hip extension strength. Patient would benefit from continued skilled physical therapy in order to address patient's deficits in strength, balance, and overall functional mobility.     Rehab Potential  Fair    Clinical Impairments  Affecting Rehab Potential  Positive: Patient's motivation, family support. Negative: severity of pain, medical complexity, chronicity of issue    PT Frequency  2x / week    PT Duration  8 weeks    PT Treatment/Interventions  ADLs/Self Care Home Management;Aquatic Therapy;Cryotherapy;Moist Heat;DME Instruction;Gait training;Stair training;Functional mobility training;Therapeutic activities;Therapeutic exercise;Balance training;Neuromuscular re-education;Patient/family education;Manual techniques;Passive range of motion;Dry needling;Energy conservation    PT Next Visit Plan  Focus on hip extension strengthening. Update HEP next session adding balance activities. Next session begin sidestepping ensuring toe maintain forward.  Add postural strengthening to imrpove awareness wiht balance activities and functional strengthening (minisquats, heel/toe raises and step ups).      PT Home Exercise Plan  09/11/17:  Bridge, SLR, STS, heel raises, LAQ    Consulted and Agree with Plan of Care  Patient       Patient will benefit from skilled therapeutic intervention in order to improve the following deficits and impairments:  Abnormal gait, Improper body mechanics, Pain, Decreased mobility, Postural dysfunction, Decreased activity tolerance, Decreased endurance, Decreased range of motion, Decreased strength, Hypomobility, Decreased balance, Difficulty walking, Impaired flexibility  Visit Diagnosis: Muscle weakness (generalized)  Bilateral leg weakness  Balance disorder  Gait abnormality     Problem List Patient Active Problem List   Diagnosis Date Noted  . Fibromyalgia 09/08/2017  . Spinal stenosis of lumbar region 09/20/2015  . 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 Moderate persistent asthma 07/03/2015  . Allergic rhinitis 07/03/2015  . Colitis due to Clostridium difficile 02/10/2015  . Abdominal pain 02/10/2015  . Nausea and vomiting 02/10/2015  . Acute renal failure ([place]) 02/10/2015  . Dehydration  02/10/2015  . Hypoxia 02/10/2015  . Hyponatremia 02/10/2015  . Hypokalemia 02/10/2015  . COPD (chronic obstructive pulmonary disease) ([place])   . Unspecified constipation 07/15/2013  . DYSPNEA 04/05/2008  . Essential hypertension 03/10/2008  . Headache(784.0) 03/10/2008  . Cough 03/10/2008   Clarene Critchley PT, DPT 12:18 PM, 10/08/17 [place] [address] [place], [place], [address] Phone: [phone]   Fax:  [phone]  Name: Betty Golden MRN: [id number] Date of Birth: [birth year]

## 2017-10-10 ENCOUNTER — Ambulatory Visit (HOSPITAL_COMMUNITY): Payer: Medicare Other

## 2017-10-10 ENCOUNTER — Encounter (HOSPITAL_COMMUNITY): Payer: Self-pay

## 2017-10-10 DIAGNOSIS — R29898 Other symptoms and signs involving the musculoskeletal system: Secondary | ICD-10-CM

## 2017-10-10 DIAGNOSIS — R269 Unspecified abnormalities of gait and mobility: Secondary | ICD-10-CM

## 2017-10-10 DIAGNOSIS — M6281 Muscle weakness (generalized): Secondary | ICD-10-CM

## 2017-10-10 DIAGNOSIS — R2689 Other abnormalities of gait and mobility: Secondary | ICD-10-CM

## 2017-10-10 NOTE — Patient Instructions (Signed)
Tandem Stance    Right foot in front of left, heel touching toe both feet "straight ahead". Stand on Foot Triangle of Support with both feet. Balance in this position 30 seconds. Do with left foot in front of right.  Copyright  VHI. All rights reserved.   Single Leg Balance: Eyes Open    Stand on right leg with eyes open. Hold 30 seconds. 5 reps 1-2 times per day.  http://ggbe.exer.us/5   Copyright  VHI. All rights reserved.    

## 2017-10-10 NOTE — Therapy (Signed)
[place], [place], [address] Phone: [phone]   Fax:  [phone]  Physical Therapy Treatment  Patient Details  Name: Betty Golden MRN: [id number] Date of Birth: [birth year] Referring Provider: Melvenia Beam, MD   Encounter Date: 10/10/2017  PT End of Session - 10/10/17 0856    Visit Number  6    Number of Visits  17    Date for PT Re-Evaluation  11/14/17    Authorization Type  [employer]    Authorization Time Period  09/11/17 - 11/14/17    PT Start Time  0816    PT Stop Time  0858    PT Time Calculation (min)  42 min    Equipment Utilized During Treatment  Gait belt    Activity Tolerance  Patient limited by pain;Patient tolerated treatment well;Patient limited by fatigue;No increased pain    Behavior During Therapy  W[facility] for tasks assessed/performed       Past Medical History:  Diagnosis Date  . Arthritis   . Asthma   . C. difficile colitis   . Cervical stenosis of spine   . Chronic headaches   . Chronic neck pain   . Depression   . Diverticulosis   . Essential hypertension   . Fibromyalgia   . GERD (gastroesophageal reflux disease)   . History of Salmonella gastroenteritis   . HNP (herniated nucleus pulposus), lumbar   . Hyperlipidemia   . IBS (irritable bowel syndrome)   . Lung nodule     Past Surgical History:  Procedure Laterality Date  . APPENDECTOMY  1965  . BREAST BIOPSY    . BREAST REDUCTION SURGERY  1994  . CHOLECYSTECTOMY  1975  . COLONOSCOPY N/A 08/19/2013   Procedure: COLONOSCOPY;  Surgeon: Rogene Houston, MD;  [facility];  Service: Endoscopy;  Laterality: N/A;  155-moved to 140 Ann to notify pt  . LUMBAR LAMINECTOMY/DECOMPRESSION MICRODISCECTOMY Bilateral 09/20/2015   Procedure: MICRO LUMBAR DECOMPRESSION L5-S1 BILATERAL    (1 LEVEL);  Surgeon: Susa Day, MD;  [facility];  Service: Orthopedics;  Laterality: Bilateral;  . Sinus Surgery    .  TONSILLECTOMY  1968    There were no vitals filed for this visit.  Subjective Assessment - 10/10/17 0818    Subjective  Pt stated she had a rough day yesterday with increased pain back and legs.  Currently back feels okay but legs pain scale 7/10.    Patient Stated Goals  Being able to walk tall and straight    Currently in Pain?  Yes    Pain Score  7     Pain Location  Leg    Pain Orientation  Right;Left    Pain Descriptors / Indicators  Aching;Sore    Pain Type  -- Hx of fibromyalgia with moving pain    Pain Radiating Towards  pain goes down legs    Pain Onset  More than a month ago    Pain Frequency  Constant    Aggravating Factors   laying down causes pain in legs to be worse    Pain Relieving Factors  walking gives a little    Effect of Pain on Daily Activities  high impact                      [facility] Adult PT Treatment/Exercise - 10/10/17 0001      Lumbar Exercises: Standing   Heel Raises  15  reps    Heel Raises Limitations  Toe raises    Functional Squats  10 reps chair tapping and cueing for mechanics    Row  15 reps;Theraband    Theraband Level (Row)  Level 2 (Red)    Shoulder Extension  15 reps;Theraband    Theraband Level (Shoulder Extension)  Level 2 (Red)    Other Standing Lumbar Exercises  lateral and forward step up 10x each 6in with minimal HHA      Lumbar Exercises: Seated   Sit to Stand  10 reps;Other (comment) no HHA           Balance Exercises - 10/10/17 0832      Balance Exercises: Standing   Tandem Stance  Eyes open;3 reps;30 secs;Foam/compliant surface    SLS  Eyes open;5 reps;Other reps (comment) 5" holds BLE max of 5    Tandem Gait  1 rep    Retro Gait  1 rep    Sidestepping  2 reps;Theraband RTB          PT Short Term Goals - 10/08/17 1139      PT SHORT TERM GOAL #1   Title  Patient will demonstrate understanding and report regular compliance with HEP.     Time  4    Period  Weeks    Status  On-going      PT SHORT  TERM GOAL #2   Title  Patient will demonstrate improvement in MMT of 1/2 grade in all deficient planes in order to assist with gait and functional mobility.     Baseline  10/08/17: Patient made improvements in MMT testing of 1/2 grade in all deficient planes except right hip extension and left hip extension.     Time  4    Period  Weeks    Status  Partially Met      PT SHORT TERM GOAL #3   Title  Patient will perform TUG in <14 seconds indicating decreased risk of falling and improved mobility.     Baseline  10/08/17: Patient performed TUG in 16.68 seconds    Time  4    Period  Weeks    Status  On-going        PT Long Term Goals - 10/08/17 1142      PT LONG TERM GOAL #1   Title  Patient will demonstrate improved MMT strength of 1 grade in all deficient planes in order to assist patient with gait and fucntional mobility.     Baseline  10/08/17: Patient has made some improvements in strength, but has not improved by 1 grade in all deficient areas.     Time  8    Period  Weeks    Status  Partially Met      PT LONG TERM GOAL #2   Title  Patient will ambulate 300 feet on 2MWT with least restrictive assistive device, improved mechanics, and without rest break indicating improved gait velocity and ambulation tolerance.     Baseline  10/08/17: Patient improved to ambulating 274 feet this session without a cane and without rest breaks.     Time  8    Period  Weeks    Status  Partially Met      PT LONG TERM GOAL #3   Title  Patient will demonstrate improvement on Berg Balance Test of 10 points indicating improved balance and decreased risk of falls.     Baseline  10/08/17: Patient improved by 7 points on the [facility]  this session    Time  8    Period  Weeks    Status  On-going            Plan - 10/10/17 0859    Clinical Impression Statement  Session focus on functional strengthening and balance training.  Pt educated on importance of posture to assist with balance activities as well pain  control.  Added postural strengthening with therabands and increased focus with gluteal strengthening following reasessment last session.  Pt limited by fatigue wiht tasks, no reports of increased pain.      Rehab Potential  Fair    Clinical Impairments Affecting Rehab Potential  Positive: Patient's motivation, family support. Negative: severity of pain, medical complexity, chronicity of issue    PT Frequency  2x / week    PT Duration  8 weeks    PT Treatment/Interventions  ADLs/Self Care Home Management;Aquatic Therapy;Cryotherapy;Moist Heat;DME Instruction;Gait training;Stair training;Functional mobility training;Therapeutic activities;Therapeutic exercise;Balance training;Neuromuscular re-education;Patient/family education;Manual techniques;Passive range of motion;Dry needling;Energy conservation    PT Next Visit Plan  Focus on hip extension strengthening, functional strengthening (minisquats heel/toe raises, step ups), postureal strengthening and balance training.      PT Home Exercise Plan  09/11/17:  Bridge, SLR, STS, heel raises, LAQ; 3/15: SLS and tandem stance       Patient will benefit from skilled therapeutic intervention in order to improve the following deficits and impairments:  Abnormal gait, Improper body mechanics, Pain, Decreased mobility, Postural dysfunction, Decreased activity tolerance, Decreased endurance, Decreased range of motion, Decreased strength, Hypomobility, Decreased balance, Difficulty walking, Impaired flexibility  Visit Diagnosis: Muscle weakness (generalized)  Bilateral leg weakness  Balance disorder  Gait abnormality     Problem List Patient Active Problem List   Diagnosis Date Noted  . Fibromyalgia 09/08/2017  . Spinal stenosis of lumbar region 09/20/2015  . 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 Moderate persistent asthma 07/03/2015  . Allergic rhinitis 07/03/2015  . Colitis due to Clostridium difficile 02/10/2015  . Abdominal pain 02/10/2015  . Nausea  and vomiting 02/10/2015  . Acute renal failure ([place]) 02/10/2015  . Dehydration 02/10/2015  . Hypoxia 02/10/2015  . Hyponatremia 02/10/2015  . Hypokalemia 02/10/2015  . COPD (chronic obstructive pulmonary disease) ([place])   . Unspecified constipation 07/15/2013  . DYSPNEA 04/05/2008  . Essential hypertension 03/10/2008  . Headache(784.0) 03/10/2008  . Cough 03/10/2008   Ihor Austin, LPTA; [place]  Aldona Lento 10/10/2017, 10:13 AM  [place], [place], [address] Phone: [phone]   Fax:  [phone]  Name: Betty Golden MRN: [id number] Date of Birth: 1946-04-06

## 2017-10-14 ENCOUNTER — Ambulatory Visit (INDEPENDENT_AMBULATORY_CARE_PROVIDER_SITE_OTHER): Payer: Medicare Other

## 2017-10-14 ENCOUNTER — Encounter (HOSPITAL_COMMUNITY): Payer: Self-pay

## 2017-10-14 ENCOUNTER — Ambulatory Visit (HOSPITAL_COMMUNITY): Payer: Medicare Other

## 2017-10-14 DIAGNOSIS — R2689 Other abnormalities of gait and mobility: Secondary | ICD-10-CM

## 2017-10-14 DIAGNOSIS — M6281 Muscle weakness (generalized): Secondary | ICD-10-CM | POA: Diagnosis not present

## 2017-10-14 DIAGNOSIS — J4550 Severe persistent asthma, uncomplicated: Secondary | ICD-10-CM | POA: Diagnosis not present

## 2017-10-14 DIAGNOSIS — R269 Unspecified abnormalities of gait and mobility: Secondary | ICD-10-CM

## 2017-10-14 DIAGNOSIS — R29898 Other symptoms and signs involving the musculoskeletal system: Secondary | ICD-10-CM

## 2017-10-14 NOTE — Therapy (Addendum)
[place], [place], [address] Phone: [phone]   Fax:  [phone]  Physical Therapy Treatment  Patient Details  Name: Betty Golden MRN: [id number] Date of Birth: [birth year] Referring Provider: Melvenia Beam, MD   Encounter Date: 10/14/2017  PT End of Session - 10/14/17 0906    Visit Number  7    Number of Visits  17    Date for PT Re-Evaluation  11/09/17    Authorization Type  [employer]    Authorization Time Period  16 visits authorized 09/11/17 - 11/09/17    PT Start Time  0816    PT Stop Time  0904    PT Time Calculation (min)  48 min    Equipment Utilized During Treatment  Gait belt    Activity Tolerance  Patient limited by pain;Patient tolerated treatment well;Patient limited by fatigue;No increased pain    Behavior During Therapy  WFL for tasks assessed/performed       Past Medical History:  Diagnosis Date  . Arthritis   . Asthma   . C. difficile colitis   . Cervical stenosis of spine   . Chronic headaches   . Chronic neck pain   . Depression   . Diverticulosis   . Essential hypertension   . Fibromyalgia   . GERD (gastroesophageal reflux disease)   . History of Salmonella gastroenteritis   . HNP (herniated nucleus pulposus), lumbar   . Hyperlipidemia   . IBS (irritable bowel syndrome)   . Lung nodule     Past Surgical History:  Procedure Laterality Date  . APPENDECTOMY  1965  . BREAST BIOPSY    . BREAST REDUCTION SURGERY  1994  . CHOLECYSTECTOMY  1975  . COLONOSCOPY N/A 08/19/2013   Procedure: COLONOSCOPY;  Surgeon: Rogene Houston, MD;  [facility];  Service: Endoscopy;  Laterality: N/A;  155-moved to 140 Ann to notify pt  . LUMBAR LAMINECTOMY/DECOMPRESSION MICRODISCECTOMY Bilateral 09/20/2015   Procedure: MICRO LUMBAR DECOMPRESSION L5-S1 BILATERAL    (1 LEVEL);  Surgeon: Susa Day, MD;  Location: [facility];  Service: Orthopedics;  Laterality: Bilateral;  . Sinus  Surgery    . TONSILLECTOMY  1968    There were no vitals filed for this visit.  Subjective Assessment - 10/14/17 0819    Subjective  Pt stated morning and at night, stated she was cleaning household yesterday and had no pain, was a little surprised.  Current pain scale 7/10 anterior thigh and hip Bil, sore achey stiff pain.      Patient Stated Goals  Being able to walk tall and straight    Currently in Pain?  Yes    Pain Score  7     Pain Location  Leg    Pain Orientation  Right;Left    Pain Descriptors / Indicators  Aching;Dull;Tightness    Pain Type  -- Hx of fibromyalgia and pain wiht movement    Pain Onset  More than a month ago    Pain Frequency  Constant    Aggravating Factors   laying down causes pain in legs to be worse    Pain Relieving Factors  walking gives a little    Effect of Pain on Daily Activities  high impact                      [facility] Adult PT Treatment/Exercise - 10/14/17 0001      Lumbar Exercises: Standing  Heel Raises  15 reps    Heel Raises Limitations  Toe raises    Wall Slides  -- 3 sets x 5 reps    Row  15 reps;Theraband    Theraband Level (Row)  Level 2 (Red)    Shoulder Extension  15 reps;Theraband    Theraband Level (Shoulder Extension)  Level 2 (Red)    Other Standing Lumbar Exercises  reciprocal pattern 4 then 7in with 2HHA; 2RT    Other Standing Lumbar Exercises  lateral and forward step up 15x each 6in with minimal HHA          Balance Exercises - 10/14/17 0855      Balance Exercises: Standing   Tandem Stance  Eyes open;Foam/compliant surface 10 reps UE in tandem stance on foam    SLS  5 reps toe tapping 3 cone 5 reps each LE    Tandem Gait  2 reps    Retro Gait  2 reps    Sidestepping  2 reps;Theraband RTB          PT Short Term Goals - 10/08/17 1139      PT SHORT TERM GOAL #1   Title  Patient will demonstrate understanding and report regular compliance with HEP.     Time  4    Period  Weeks    Status   On-going      PT SHORT TERM GOAL #2   Title  Patient will demonstrate improvement in MMT of 1/2 grade in all deficient planes in order to assist with gait and functional mobility.     Baseline  10/08/17: Patient made improvements in MMT testing of 1/2 grade in all deficient planes except right hip extension and left hip extension.     Time  4    Period  Weeks    Status  Partially Met      PT SHORT TERM GOAL #3   Title  Patient will perform TUG in <14 seconds indicating decreased risk of falling and improved mobility.     Baseline  10/08/17: Patient performed TUG in 16.68 seconds    Time  4    Period  Weeks    Status  On-going        PT Long Term Goals - 10/08/17 1142      PT LONG TERM GOAL #1   Title  Patient will demonstrate improved MMT strength of 1 grade in all deficient planes in order to assist patient with gait and fucntional mobility.     Baseline  10/08/17: Patient has made some improvements in strength, but has not improved by 1 grade in all deficient areas.     Time  8    Period  Weeks    Status  Partially Met      PT LONG TERM GOAL #2   Title  Patient will ambulate 300 feet on 2MWT with least restrictive assistive device, improved mechanics, and without rest break indicating improved gait velocity and ambulation tolerance.     Baseline  10/08/17: Patient improved to ambulating 274 feet this session without a cane and without rest breaks.     Time  8    Period  Weeks    Status  Partially Met      PT LONG TERM GOAL #3   Title  Patient will demonstrate improvement on Berg Balance Test of 10 points indicating improved balance and decreased risk of falls.     Baseline  10/08/17: Patient improved by 7 points  on the Berg this session    Time  8    Period  Weeks    Status  On-going            Plan - 10/14/17 1244    Clinical Impression Statement  Continued session focus with functional strengthening and balance training.  Pt reports decreased in overall LE pain with  exercises.  Pt continues to require cueing to improve posture to assist wtih balance and reduce LE pain.  Added wall slides to improve tolerance with squats for gluteal strengthening.  No reports of pain through session.      Rehab Potential  Fair    Clinical Impairments Affecting Rehab Potential  Positive: Patient's motivation, family support. Negative: severity of pain, medical complexity, chronicity of issue    PT Frequency  2x / week    PT Duration  8 weeks    PT Treatment/Interventions  ADLs/Self Care Home Management;Aquatic Therapy;Cryotherapy;Moist Heat;DME Instruction;Gait training;Stair training;Functional mobility training;Therapeutic activities;Therapeutic exercise;Balance training;Neuromuscular re-education;Patient/family education;Manual techniques;Passive range of motion;Dry needling;Energy conservation    PT Next Visit Plan  Focus on hip extension strengthening, functional strengthening (minisquats heel/toe raises, step ups), postureal strengthening and balance training.      PT Home Exercise Plan  09/11/17:  Bridge, SLR, STS, heel raises, LAQ; 3/15: SLS and tandem stance       Patient will benefit from skilled therapeutic intervention in order to improve the following deficits and impairments:  Abnormal gait, Improper body mechanics, Pain, Decreased mobility, Postural dysfunction, Decreased activity tolerance, Decreased endurance, Decreased range of motion, Decreased strength, Hypomobility, Decreased balance, Difficulty walking, Impaired flexibility  Visit Diagnosis: Muscle weakness (generalized)  Bilateral leg weakness  Balance disorder  Gait abnormality     Problem List Patient Active Problem List   Diagnosis Date Noted  . Fibromyalgia 09/08/2017  . Spinal stenosis of lumbar region 09/20/2015  . 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 Moderate persistent asthma 07/03/2015  . Allergic rhinitis 07/03/2015  . Colitis due to Clostridium difficile 02/10/2015  . Abdominal pain  02/10/2015  . Nausea and vomiting 02/10/2015  . Acute renal failure ([place]) 02/10/2015  . Dehydration 02/10/2015  . Hypoxia 02/10/2015  . Hyponatremia 02/10/2015  . Hypokalemia 02/10/2015  . COPD (chronic obstructive pulmonary disease) ([place])   . Unspecified constipation 07/15/2013  . DYSPNEA 04/05/2008  . Essential hypertension 03/10/2008  . Headache(784.0) 03/10/2008  . Cough 03/10/2008   Ihor Austin, LPTA; [place]  Aldona Lento 10/14/2017, 12:47 PM  [place], [place], [address] Phone: [phone]   Fax:  [phone]  Name: SAPHIRA LAHMANN MRN: [id number] Date of Birth: 23-Dec-1945

## 2017-10-16 ENCOUNTER — Ambulatory Visit (HOSPITAL_COMMUNITY): Payer: Medicare Other

## 2017-10-16 ENCOUNTER — Telehealth (HOSPITAL_COMMUNITY): Payer: Self-pay | Admitting: Family Medicine

## 2017-10-16 NOTE — Telephone Encounter (Signed)
10/16/17  daughter called and left a message that patient wouldnt be here today

## 2017-10-23 ENCOUNTER — Telehealth: Payer: Self-pay

## 2017-10-23 NOTE — Telephone Encounter (Signed)
Patient would like to proceed with research study regarding [employer] use.  Please advise.

## 2017-10-24 NOTE — Telephone Encounter (Signed)
I talked to her yesterday. Thanks much!   [name], MD [facility]

## 2017-10-28 ENCOUNTER — Ambulatory Visit (HOSPITAL_COMMUNITY): Payer: Medicare Other | Attending: Neurology | Admitting: Physical Therapy

## 2017-10-28 ENCOUNTER — Encounter (HOSPITAL_COMMUNITY): Payer: Self-pay | Admitting: Physical Therapy

## 2017-10-28 DIAGNOSIS — R269 Unspecified abnormalities of gait and mobility: Secondary | ICD-10-CM | POA: Diagnosis present

## 2017-10-28 DIAGNOSIS — R2689 Other abnormalities of gait and mobility: Secondary | ICD-10-CM | POA: Diagnosis present

## 2017-10-28 DIAGNOSIS — M6281 Muscle weakness (generalized): Secondary | ICD-10-CM | POA: Diagnosis present

## 2017-10-28 DIAGNOSIS — R29898 Other symptoms and signs involving the musculoskeletal system: Secondary | ICD-10-CM | POA: Diagnosis present

## 2017-10-28 NOTE — Therapy (Signed)
[place], [place], [address] Phone: [phone]   Fax:  [phone]  Physical Therapy Treatment  Patient Details  Name: Betty Golden MRN: [id number] Date of Birth: [birth year] Referring Provider: Melvenia Beam, MD   Encounter Date: 10/28/2017  PT End of Session - 10/28/17 0857    Visit Number  8    Number of Visits  17    Date for PT Re-Evaluation  11/09/17    Authorization Type  [employer]    Authorization Time Period  09/11/17 - 11/09/17    PT Start Time  0826    PT Stop Time  0907    PT Time Calculation (min)  41 min    Equipment Utilized During Treatment  Gait belt    Activity Tolerance  Patient limited by pain;Patient tolerated treatment well;Patient limited by fatigue;No increased pain    Behavior During Therapy  WFL for tasks assessed/performed       Past Medical History:  Diagnosis Date  . Arthritis   . Asthma   . C. difficile colitis   . Cervical stenosis of spine   . Chronic headaches   . Chronic neck pain   . Depression   . Diverticulosis   . Essential hypertension   . Fibromyalgia   . GERD (gastroesophageal reflux disease)   . History of Salmonella gastroenteritis   . HNP (herniated nucleus pulposus), lumbar   . Hyperlipidemia   . IBS (irritable bowel syndrome)   . Lung nodule     Past Surgical History:  Procedure Laterality Date  . APPENDECTOMY  1965  . BREAST BIOPSY    . BREAST REDUCTION SURGERY  1994  . CHOLECYSTECTOMY  1975  . COLONOSCOPY N/A 08/19/2013   Procedure: COLONOSCOPY;  Surgeon: Rogene Houston, MD;  [facility];  Service: Endoscopy;  Laterality: N/A;  155-moved to 140 Ann to notify pt  . LUMBAR LAMINECTOMY/DECOMPRESSION MICRODISCECTOMY Bilateral 09/20/2015   Procedure: MICRO LUMBAR DECOMPRESSION L5-S1 BILATERAL    (1 LEVEL);  Surgeon: Susa Day, MD;  Location[facility];  Service: Orthopedics;  Laterality: Bilateral;  . Sinus Surgery    .  TONSILLECTOMY  1968    There were no vitals filed for this visit.  Subjective Assessment - 10/28/17 0843    Subjective  Patient stated she had a fall on Thursday. She said it happened quickly. She saw her physician and he gave her a shot in her low back, her right hip, and her neck. Patient stated she has been doing her exercises a little, but not much.     Patient Stated Goals  Being able to walk tall and straight    Currently in Pain?  Yes    Pain Score  5     Pain Location  Leg    Pain Orientation  Right    Pain Descriptors / Indicators  Aching    Pain Type  Other (Comment) History of fibromyalgia and pain with movement                       [facility] Adult PT Treatment/Exercise - 10/28/17 0001      Knee/Hip Exercises: Standing   Lateral Step Up  Right;Left;15 reps;Step Height: 6" Minimal hand hold assist    Forward Step Up  Right;Left;15 reps;Step Height: 6" Minimal hand hold assist          Balance Exercises - 10/28/17 0847  Balance Exercises: Standing   Tandem Stance  Eyes open;Foam/compliant surface 10 repetitions each lower extremity forward intermittent UE    SLS  Other reps (comment) Single limb stance x10 each lower extremity    SLS with Vectors  Solid surface;Intermittent upper extremity assist;5 reps;Other reps (comment) 5 repetitions each lower extremity toward 3 cones    Tandem Gait  Other reps (comment) 3 repetitions 14 feet    Retro Gait  Other (comment) 3 repetitions of 14 feet    Turning  Both;Other reps (comment) 2 minutes of practice with therapist directing    Step Over Hurdles / Cones  Ambulating 3 x 14 feet stepping over 4 6 inch hurdles. Ambulating 3 x 14 feet stepping over 4 12 inch hurdles Demonstration and VCs step over not circumduct    Marching Limitations  On foam holding for 3 seconds alternating lower extremities x 20 with intermittent upper extremity support        PT Education - 10/28/17 0847    Education provided  Yes     Education Details  Patient was educated on purpose and technique of exercises throughout session.     Person(s) Educated  Patient    Methods  Explanation;Verbal cues;Tactile cues;Demonstration    Comprehension  Verbalized understanding;Returned demonstration       PT Short Term Goals - 10/08/17 1139      PT SHORT TERM GOAL #1   Title  Patient will demonstrate understanding and report regular compliance with HEP.     Time  4    Period  Weeks    Status  On-going      PT SHORT TERM GOAL #2   Title  Patient will demonstrate improvement in MMT of 1/2 grade in all deficient planes in order to assist with gait and functional mobility.     Baseline  10/08/17: Patient made improvements in MMT testing of 1/2 grade in all deficient planes except right hip extension and left hip extension.     Time  4    Period  Weeks    Status  Partially Met      PT SHORT TERM GOAL #3   Title  Patient will perform TUG in <14 seconds indicating decreased risk of falling and improved mobility.     Baseline  10/08/17: Patient performed TUG in 16.68 seconds    Time  4    Period  Weeks    Status  On-going        PT Long Term Goals - 10/08/17 1142      PT LONG TERM GOAL #1   Title  Patient will demonstrate improved MMT strength of 1 grade in all deficient planes in order to assist patient with gait and fucntional mobility.     Baseline  10/08/17: Patient has made some improvements in strength, but has not improved by 1 grade in all deficient areas.     Time  8    Period  Weeks    Status  Partially Met      PT LONG TERM GOAL #2   Title  Patient will ambulate 300 feet on 2MWT with least restrictive assistive device, improved mechanics, and without rest break indicating improved gait velocity and ambulation tolerance.     Baseline  10/08/17: Patient improved to ambulating 274 feet this session without a cane and without rest breaks.     Time  8    Period  Weeks    Status  Partially Met  PT LONG TERM GOAL  #3   Title  Patient will demonstrate improvement on Berg Balance Test of 10 points indicating improved balance and decreased risk of falls.     Baseline  10/08/17: Patient improved by 7 points on the Berg this session    Time  8    Period  Weeks    Status  On-going            Plan - 10/28/17 2426    Clinical Impression Statement  This session focused on balance as patient recently had a fall. This session added stepping over 6 and 12 inch hurdles without an assistive device. Patient required minimal guarding with this. This session also added ambulation with turning. This session progressed repetitions of tandem and retro ambulation to challenge patient's balance further. Patient reported that she only does her home exercise program occasionally, and therapist informed her of importance of doing her home exercises and that she would wait to update the home exercises until patient feels she can successfully complete her current exercises. Patient also reported that her physician updated her medications, but is unsure of the names and dosages and therefore therapist told patient to bring an updated list. Patient would continue to benefit from skilled physical therapy to address deficits in static and dynamic balance, posture, and lower extremity strength.     Rehab Potential  Fair    Clinical Impairments Affecting Rehab Potential  Positive: Patient's motivation, family support. Negative: severity of pain, medical complexity, chronicity of issue    PT Frequency  2x / week    PT Duration  8 weeks    PT Treatment/Interventions  ADLs/Self Care Home Management;Aquatic Therapy;Cryotherapy;Moist Heat;DME Instruction;Gait training;Stair training;Functional mobility training;Therapeutic activities;Therapeutic exercise;Balance training;Neuromuscular re-education;Patient/family education;Manual techniques;Passive range of motion;Dry needling;Energy conservation    PT Next Visit Plan  Focus on balance  progressing dynamic balance. Incorporate obstacle navigation and challenge with dual task. For strengthening focus on hip extension strengthening, functional strengthening (minisquats heel/toe raises, step ups), and postureal strengthening.      PT Home Exercise Plan  09/11/17:  Bridge, SLR, STS, heel raises, LAQ; 3/15: SLS and tandem stance    Consulted and Agree with Plan of Care  Patient       Patient will benefit from skilled therapeutic intervention in order to improve the following deficits and impairments:  Abnormal gait, Improper body mechanics, Pain, Decreased mobility, Postural dysfunction, Decreased activity tolerance, Decreased endurance, Decreased range of motion, Decreased strength, Hypomobility, Decreased balance, Difficulty walking, Impaired flexibility  Visit Diagnosis: Muscle weakness (generalized)  Bilateral leg weakness  Balance disorder  Gait abnormality     Problem List Patient Active Problem List   Diagnosis Date Noted  . Fibromyalgia 09/08/2017  . Spinal stenosis of lumbar region 09/20/2015  . 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 Moderate persistent asthma 07/03/2015  . Allergic rhinitis 07/03/2015  . Colitis due to Clostridium difficile 02/10/2015  . Abdominal pain 02/10/2015  . Nausea and vomiting 02/10/2015  . Acute renal failure ([place]) 02/10/2015  . Dehydration 02/10/2015  . Hypoxia 02/10/2015  . Hyponatremia 02/10/2015  . Hypokalemia 02/10/2015  . COPD (chronic obstructive pulmonary disease) ([place])   . Unspecified constipation 07/15/2013  . DYSPNEA 04/05/2008  . Essential hypertension 03/10/2008  . Headache(784.0) 03/10/2008  . Cough 03/10/2008   Clarene Critchley PT, DPT 9:27 AM, 10/28/17 [place], [place], [address] Phone: [phone]   Fax:  [phone]  Name: Betty Golden  MRN: [id number] Date of Birth: 06/24/1946

## 2017-10-30 ENCOUNTER — Encounter (HOSPITAL_COMMUNITY): Payer: Medicare Other | Admitting: Physical Therapy

## 2017-11-04 ENCOUNTER — Telehealth (HOSPITAL_COMMUNITY): Payer: Self-pay | Admitting: Physical Therapy

## 2017-11-04 ENCOUNTER — Ambulatory Visit (HOSPITAL_COMMUNITY): Payer: Medicare Other | Admitting: Physical Therapy

## 2017-11-04 NOTE — Telephone Encounter (Signed)
Patient canceled for today she is in alot of pain and is waiting for her MD to call her with a appt for today.

## 2017-11-04 NOTE — Telephone Encounter (Signed)
Approval ends on 11/09/17- this is the last visit scheduled. NF4/9/19

## 2017-11-06 ENCOUNTER — Telehealth (HOSPITAL_COMMUNITY): Payer: Self-pay | Admitting: Physical Therapy

## 2017-11-06 ENCOUNTER — Ambulatory Visit (HOSPITAL_COMMUNITY): Payer: Medicare Other | Admitting: Physical Therapy

## 2017-11-06 NOTE — Telephone Encounter (Signed)
Patient's daughter called and canceled her moms appt she took a fall on yesterday and is in alot of pain. They will call us later to make another appt.

## 2017-11-07 ENCOUNTER — Ambulatory Visit (HOSPITAL_COMMUNITY): Payer: Medicare Other | Admitting: Physical Therapy

## 2017-11-07 ENCOUNTER — Encounter (HOSPITAL_COMMUNITY): Payer: Self-pay | Admitting: Physical Therapy

## 2017-11-07 DIAGNOSIS — R2689 Other abnormalities of gait and mobility: Secondary | ICD-10-CM

## 2017-11-07 DIAGNOSIS — M6281 Muscle weakness (generalized): Secondary | ICD-10-CM | POA: Diagnosis not present

## 2017-11-07 DIAGNOSIS — R29898 Other symptoms and signs involving the musculoskeletal system: Secondary | ICD-10-CM

## 2017-11-07 DIAGNOSIS — R269 Unspecified abnormalities of gait and mobility: Secondary | ICD-10-CM

## 2017-11-07 NOTE — Therapy (Signed)
[facility] [address] [place], [place], [address] Phone: [phone]   Fax:  [phone]  Physical Therapy Treatment / Re-assessment / Discharge Summary  Patient Details  Name: Betty Golden MRN: [id number] Date of Birth: 04-10-1946 Referring Provider: Melvenia Beam, MD   Encounter Date: 11/07/2017  PT End of Session - 11/07/17 1604    Visit Number  9    Number of Visits  17    Date for PT Re-Evaluation  11/09/17    Authorization Type  [employer]    Authorization Time Period  09/11/17 - 11/09/17    PT Start Time  1350    PT Stop Time  1429 Some time unbilled for re-assessment    PT Time Calculation (min)  39 min    Equipment Utilized During Treatment  Gait belt    Activity Tolerance  Patient tolerated treatment well;No increased pain    Behavior During Therapy  WFL for tasks assessed/performed       Past Medical History:  Diagnosis Date  . Arthritis   . Asthma   . C. difficile colitis   . Cervical stenosis of spine   . Chronic headaches   . Chronic neck pain   . Depression   . Diverticulosis   . Essential hypertension   . Fibromyalgia   . GERD (gastroesophageal reflux disease)   . History of Salmonella gastroenteritis   . HNP (herniated nucleus pulposus), lumbar   . Hyperlipidemia   . IBS (irritable bowel syndrome)   . Lung nodule     Past Surgical History:  Procedure Laterality Date  . APPENDECTOMY  1965  . BREAST BIOPSY    . BREAST REDUCTION SURGERY  1994  . CHOLECYSTECTOMY  1975  . COLONOSCOPY N/A 08/19/2013   Procedure: COLONOSCOPY;  Surgeon: Rogene Houston, MD;  Location: [facility];  Service: Endoscopy;  Laterality: N/A;  155-moved to 140 Ann to notify pt  . LUMBAR LAMINECTOMY/DECOMPRESSION MICRODISCECTOMY Bilateral 09/20/2015   Procedure: MICRO LUMBAR DECOMPRESSION L5-S1 BILATERAL    (1 LEVEL);  Surgeon: Susa Day, MD;  Location: [facility];  Service: Orthopedics;  Laterality: Bilateral;  . Sinus  Surgery    . TONSILLECTOMY  1968    There were no vitals filed for this visit.  Subjective Assessment - 11/07/17 1353    Subjective  Patient reported that she had a fall recently and that she missed a step. She denied any residual pain from where she fell.     How long can you sit comfortably?  15 minutes    How long can you stand comfortably?  10 minutes    How long can you walk comfortably?  10 minutes    Patient Stated Goals  Being able to walk tall and straight    Currently in Pain?  Yes    Pain Score  5     Pain Location  Arm    Pain Orientation  Right    Pain Descriptors / Indicators  Sharp    Pain Type  Chronic pain Associated with fibromyalgia not most recent fall patient says    Multiple Pain Sites  No         [facility] PT Assessment - 11/07/17 0001      Assessment   Medical Diagnosis  Weakness, Muscle weakness (generalized), Right leg weakness, left leg weakness, balance disorder, gait abnormality    Referring Provider  Melvenia Beam, MD    Onset Date/Surgical Date  -- Several  years    Prior Therapy  After back surgery      [place]    Type of Home  Other(Comment) Townhouse    Entrance Stairs-Number of Steps  2    Entrance Stairs-Rails  None    Home Layout  Two level patient stays on the first level    Additional Comments  Patient uses single point cane intermittently      Prior Function   Level of Independence  Independent;Independent with basic ADLs      Observation/Other Assessments   Observations  Patient demonstrated increased kyphosis and forward flexed posture with walking    Focus on Therapeutic Outcomes (FOTO)   50% (50% limited)      AROM   Overall AROM Comments  Patient denied pain with lumbar range of motion    Lumbar Flexion  Minimally limited 1 inch from touching toes    Lumbar Extension  Minimally limited    Lumbar - Right Side Bend  Minimally limited    Lumbar  - Left Side Bend  Minimally limited    Lumbar - Right Rotation  Minimally limited    Lumbar - Left Rotation  Minimally limited      Strength   Right Hip Flexion  4+/5 was 3/5, then 4-/5    Right Hip Extension  4/5 was 2+/5, then 2+/5    Right Hip ABduction  4+/5 was 3+/5 then 4-/5    Left Hip Flexion  4+/5 was 3/5 then 4-/5    Left Hip Extension  4/5 was 2-/5 then 2-/5    Left Hip ABduction  4+/5 was 3+/5 then 4-/5    Right Knee Flexion  4+/5 was 2+/5 then 4/5    Right Knee Extension  4+/5 was 3+/5 then 4/5    Left Knee Flexion  4+/5 was 3-/5 then 4/5    Left Knee Extension  4+/5 was 3+/5 then 4/5    Right Ankle Dorsiflexion  5/5 was 3+/5 then 4+/5    Right Ankle Plantar Flexion  4/5 was 2/5 then 3/5    Left Ankle Dorsiflexion  5/5 was 3+/5 then 4+/5    Left Ankle Plantar Flexion  4/5 was 2/5 then 3/5      Palpation   Spinal mobility  General hypomobility throughout    Palpation comment  Decreased reports of pain and fewer muscular restrictions throughout lumbar paraspinals      Ambulation/Gait   Ambulation/Gait  Yes    Ambulation/Gait Assistance  5: Supervision    Ambulation Distance (Feet)  416 Feet    Assistive device  None    Gait Pattern  Decreased arm swing - right;Decreased arm swing - left;Decreased stride length;Decreased hip/knee flexion - left;Decreased hip/knee flexion - right;Trunk flexed    Ambulation Surface  Level    Gait velocity  1.05 m/s    Gait Comments  Patient ambulated with improved velocity without assistive device and without rest break      Berg Balance Test   Sit to Stand  Able to stand without using hands and stabilize independently    Standing Unsupported  Able to stand safely 2 minutes    Sitting with Back Unsupported but Feet Supported on Floor or Stool  Able to sit safely and securely 2 minutes    Stand to Sit  Sits safely with minimal use of hands    Transfers  Able to transfer safely, minor use  of hands    Standing Unsupported with Eyes Closed   Able to stand 10 seconds safely    Standing Ubsupported with Feet Together  Able to place feet together independently and stand 1 minute safely    From Standing, Reach Forward with Outstretched Arm  Can reach forward >12 cm safely (5")    From Standing Position, Pick up Object from Floor  Able to pick up shoe, needs supervision    From Standing Position, Turn to Look Behind Over each Shoulder  Looks behind from both sides and weight shifts well    Turn 360 Degrees  Able to turn 360 degrees safely in 4 seconds or less    Standing Unsupported, Alternately Place Feet on Step/Stool  Able to stand independently and safely and complete 8 steps in 20 seconds    Standing Unsupported, One Foot in Front  Able to take small step independently and hold 30 seconds    Standing on One Leg  Able to lift leg independently and hold 5-10 seconds    Total Score  51    Berg comment:  Was 31 and then 39      Timed Up and Go Test   TUG  Normal TUG    Normal TUG (seconds)  10.45    TUG Comments  Without use of hands or cane                   [facility] Adult PT Treatment/Exercise - 11/07/17 0001      Knee/Hip Exercises: Standing   Hip Extension  Stengthening;Right;Left;1 set;Knee straight;15 reps;Other (comment)             PT Education - 11/07/17 1603    Education provided  Yes    Education Details  Patient was educated on results of re-assessment, about falls preventions, updated HEP and discharge.     Person(s) Educated  Patient    Methods  Explanation;Demonstration;Handout    Comprehension  Verbalized understanding;Returned demonstration       PT Short Term Goals - 11/07/17 1550      PT SHORT TERM GOAL #1   Title  Patient will demonstrate understanding and report regular compliance with HEP.     Baseline  11/07/17: Patient reported she tries to do exercises reviewed them today and updated HEP.     Time  4    Period  Weeks    Status  On-going      PT SHORT TERM GOAL #2   Title   Patient will demonstrate improvement in MMT of 1/2 grade in all deficient planes in order to assist with gait and functional mobility.     Baseline  11/07/17: Patient demonstrated improvement of at least 1/2 MMT grade in all deficient planes when compared to evaluation.     Time  4    Period  Weeks    Status  Achieved      PT SHORT TERM GOAL #3   Title  Patient will perform TUG in <14 seconds indicating decreased risk of falling and improved mobility.     Baseline  11/07/17: Patient performed TUG in 10.45 seconds without AD and without use of hands.     Time  4    Period  Weeks    Status  Achieved        PT Long Term Goals - 11/07/17 1552      PT LONG TERM GOAL #1   Title  Patient will demonstrate improved MMT strength of 1  grade in all deficient planes in order to assist patient with gait and fucntional mobility.     Baseline  11/07/17: Patient improved MMT strength by at least 1 grade in all deficient planes from evaluation.     Time  8    Period  Weeks    Status  Achieved      PT LONG TERM GOAL #2   Title  Patient will ambulate 300 feet on 2MWT with least restrictive assistive device, improved mechanics, and without rest break indicating improved gait velocity and ambulation tolerance.     Baseline  11/07/17: Patient ambulatd 416 feet this session without a cane and without rest breaks.     Time  8    Period  Weeks    Status  Achieved      PT LONG TERM GOAL #3   Title  Patient will demonstrate improvement on Berg Balance Test of 10 points indicating improved balance and decreased risk of falls.     Baseline  11/07/17: Patient improved by 20 points on the Berg when compared to score at evaluation.     Time  8    Period  Weeks    Status  Achieved            Plan - 11/07/17 1605    Clinical Impression Statement  This session performed a re-assessment of patient's progress toward goals. Patient has achieved 2 out of 3 of her short term goals. Patient has achieved 3 out of 3  of her long term goals. Patient has demonstrated improvements in her balance, strength, ability to ambulate without assistive device, and with overall functional mobility. Patient also reported that she feels that her functional level is good at this point and her FOTO score was found to be 50% which exceeded the predicted score. Patient had reported a recent fall, but denied any residual pain in any area where she fell and also reported that she just missed a step while going down the stairs. The remainder of the session consisted of the therapist educating the patient about additional home exercise, the senior center, and providing patient with information regarding fall prevention in the home. Patient is pleased with her functional status at this time and has met and is being discharged at this time. Patient was educated on how to contact the clinic if she had any questions or concerns in the future.     Rehab Potential  Fair    Clinical Impairments Affecting Rehab Potential  Positive: Patient's motivation, family support. Negative: severity of pain, medical complexity, chronicity of issue    PT Frequency  2x / week    PT Duration  8 weeks    PT Treatment/Interventions  ADLs/Self Care Home Management;Aquatic Therapy;Cryotherapy;Moist Heat;DME Instruction;Gait training;Stair training;Functional mobility training;Therapeutic activities;Therapeutic exercise;Balance training;Neuromuscular re-education;Patient/family education;Manual techniques;Passive range of motion;Dry needling;Energy conservation    PT Next Visit Plan  Patient is being discharged at this time    PT Home Exercise Plan  09/11/17:  Bridge, SLR, STS, heel raises, LAQ; 3/15: SLS and tandem stance; 11/07/17: Standing hip extension 1x15 each lower extremity red theraband 1x/day    Consulted and Agree with Plan of Care  Patient       Patient will benefit from skilled therapeutic intervention in order to improve the following deficits and  impairments:  Abnormal gait, Improper body mechanics, Pain, Decreased mobility, Postural dysfunction, Decreased activity tolerance, Decreased endurance, Decreased range of motion, Decreased strength, Hypomobility, Decreased balance, Difficulty walking, Impaired flexibility  Visit Diagnosis: Muscle weakness (generalized)  Bilateral leg weakness  Balance disorder  Gait abnormality     Problem List Patient Active Problem List   Diagnosis Date Noted  . Fibromyalgia 09/08/2017  . Spinal stenosis of lumbar region 09/20/2015  . 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 Moderate persistent asthma 07/03/2015  . Allergic rhinitis 07/03/2015  . Colitis due to Clostridium difficile 02/10/2015  . Abdominal pain 02/10/2015  . Nausea and vomiting 02/10/2015  . Acute renal failure ([facility]) 02/10/2015  . Dehydration 02/10/2015  . Hypoxia 02/10/2015  . Hyponatremia 02/10/2015  . Hypokalemia 02/10/2015  . COPD (chronic obstructive pulmonary disease) ([facility])   . Unspecified constipation 07/15/2013  . DYSPNEA 04/05/2008  . Essential hypertension 03/10/2008  . Headache(784.0) 03/10/2008  . Cough 03/10/2008   PHYSICAL THERAPY DISCHARGE SUMMARY  Visits from Start of Care: 9  Current functional level related to goals / functional outcomes: See above   Remaining deficits: See above   Education / Equipment: Patient was educated on results of re-assessment, discussed continuing home exercises and updated home exercise program, educated patient about how to modify her home to reduce falls as well as about the local senior center.  Plan: Patient agrees to discharge.  Patient goals were partially met. Patient is being discharged due to being pleased with the current functional level.  ?????         Patient also met the majority of her short-term and long-term goals.    Clarene Critchley PT, DPT 4:15 PM, 11/07/17 [place], [place], [address] Phone: [phone]   Fax:  [phone]  Name: Betty Golden MRN: [id number] Date of Birth: 26-Dec-1945

## 2017-11-07 NOTE — Patient Instructions (Signed)
  HIP EXTENSION - STANDING While standing, balance on one leg and move your other leg in a backward direction. Do not swing the leg. Perform smooth and controlled movements. Keep your trunk stable and without arching during the movement. Use your arms for support if needed for balance and safety. 1x15 each leg with red theraband  Repeat 15 Times Hold 1 Second Complete 1 Set Perform 1 Time(s) a Day

## 2017-11-11 ENCOUNTER — Ambulatory Visit: Payer: Self-pay

## 2017-11-12 IMAGING — DX DG SPINE 1V PORT
1 series · 1 of 1 positions shown · non-contrast
Comparison: 4958 hours today and earlier.

CLINICAL DATA: 69-year-old female undergoing lumbar spine surgery.
Initial encounter.

EXAM:
PORTABLE SPINE - 1 VIEW

[l-spine lat]
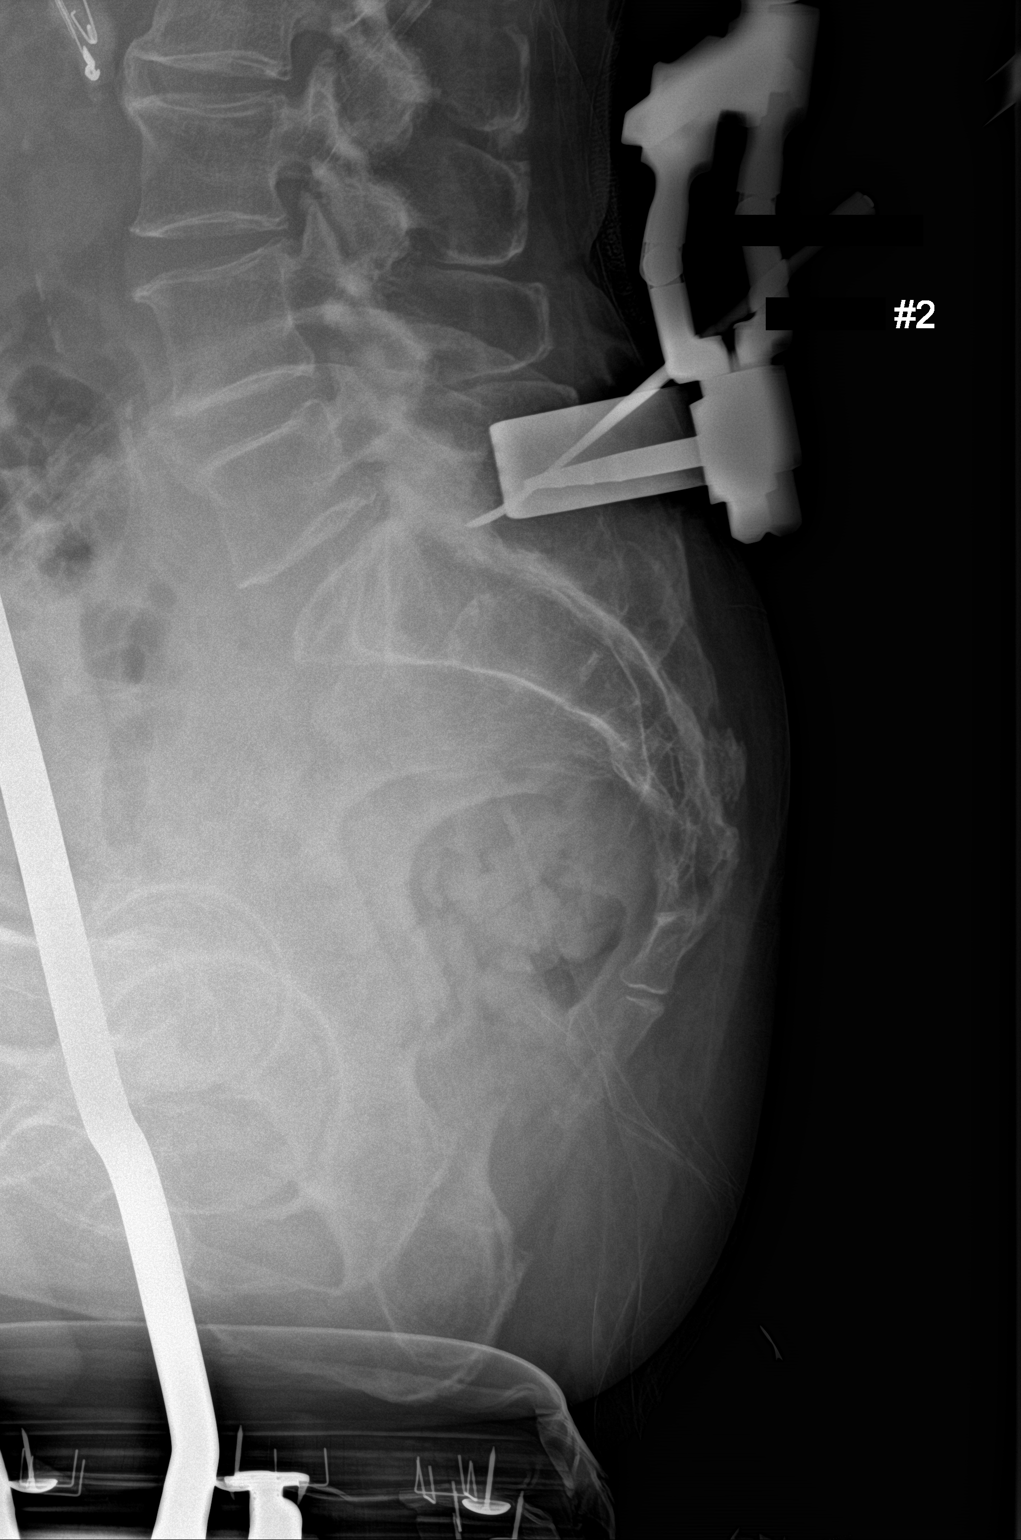

[1 of 1 positions shown; findings below may reference images not displayed]

FINDINGS: Intraoperative portable cross-table lateral view of the lumbar spine
labeled image #2 at 6556 hours. Surgical probe now directed at the
mid S1 level.
IMPRESSION: Intraoperative localization at S1.

## 2017-11-12 IMAGING — DX DG SPINE 1V PORT
1 series · 1 of 1 positions shown · non-contrast
Comparison: Preoperative lumbar radiographs 09/12/2015. CT Abdomen
and Pelvis 03/09/2015.

CLINICAL DATA: 69-year-old female undergoing lumbar spine surgery.
Initial encounter.

EXAM:
PORTABLE SPINE - 1 VIEW

[l-spine lat]
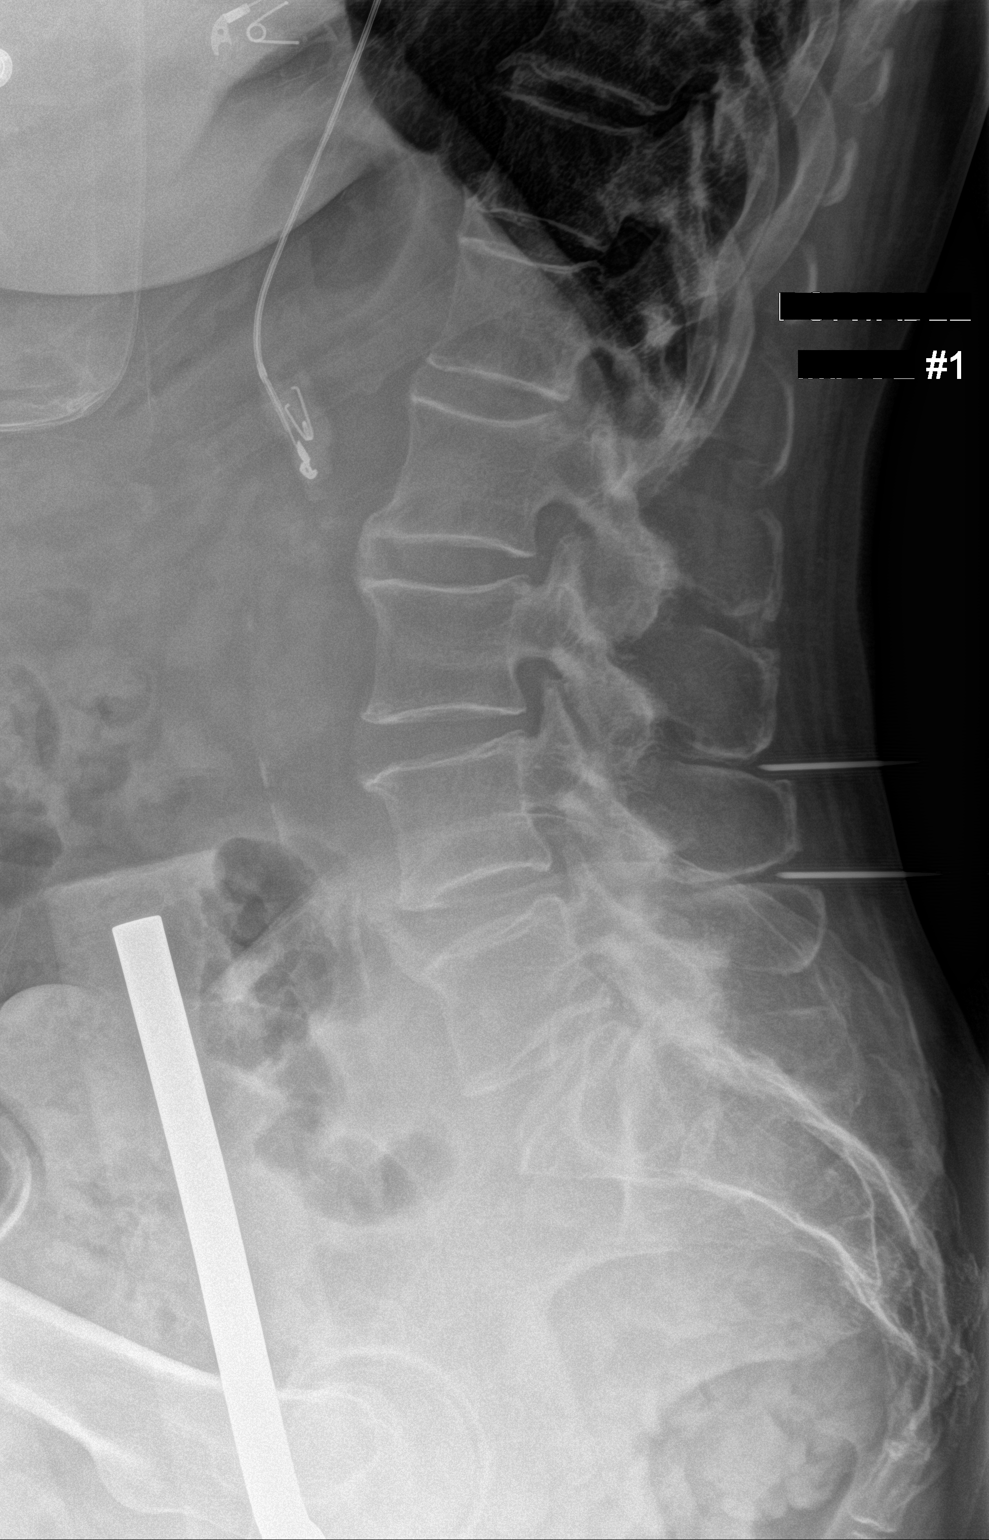

[1 of 1 positions shown; findings below may reference images not displayed]

FINDINGS: Normal lumbar segmentation demonstrated on the recent radiographs.

Intraoperative portable cross-table lateral view of the lumbar spine
labeled #1 at 6478 hours. Cephalad needle directed between the L3
and L4 spinous processes. Caudal needle directed between the L4 and
L5 spinous processes.
IMPRESSION: Intraoperative localization as above.

## 2017-12-09 ENCOUNTER — Ambulatory Visit (INDEPENDENT_AMBULATORY_CARE_PROVIDER_SITE_OTHER): Payer: Medicare Other | Admitting: *Deleted

## 2017-12-09 DIAGNOSIS — J4540 Moderate persistent asthma, uncomplicated: Secondary | ICD-10-CM

## 2017-12-09 DIAGNOSIS — J4550 Severe persistent asthma, uncomplicated: Secondary | ICD-10-CM

## 2018-02-03 ENCOUNTER — Ambulatory Visit: Payer: Medicare Other

## 2018-02-10 ENCOUNTER — Ambulatory Visit: Payer: Medicare Other

## 2018-02-17 ENCOUNTER — Ambulatory Visit (INDEPENDENT_AMBULATORY_CARE_PROVIDER_SITE_OTHER): Payer: Medicare Other | Admitting: *Deleted

## 2018-02-17 DIAGNOSIS — J4540 Moderate persistent asthma, uncomplicated: Secondary | ICD-10-CM

## 2018-02-17 DIAGNOSIS — J4550 Severe persistent asthma, uncomplicated: Secondary | ICD-10-CM

## 2018-03-31 ENCOUNTER — Ambulatory Visit: Payer: Medicare Other | Admitting: Allergy & Immunology

## 2018-03-31 ENCOUNTER — Encounter: Payer: Self-pay | Admitting: Allergy & Immunology

## 2018-03-31 VITALS — BP 140/76 | HR 88 | Temp 97.8°F | Resp 18

## 2018-03-31 DIAGNOSIS — J310 Chronic rhinitis: Secondary | ICD-10-CM

## 2018-03-31 DIAGNOSIS — J4540 Moderate persistent asthma, uncomplicated: Secondary | ICD-10-CM | POA: Diagnosis not present

## 2018-03-31 DIAGNOSIS — J019 Acute sinusitis, unspecified: Secondary | ICD-10-CM

## 2018-03-31 MED ORDER — FLUTICASONE FUROATE 100 MCG/ACT IN AEPB: 1.0000 | INHALATION_SPRAY | Freq: Every day | RESPIRATORY_TRACT | 5 refills | Status: DC

## 2018-03-31 MED ORDER — AMOXICILLIN-POT CLAVULANATE 875-125 MG PO TABS: 1.0000 | ORAL_TABLET | Freq: Two times a day (BID) | ORAL | 0 refills | Status: AC

## 2018-03-31 MED ORDER — ALBUTEROL SULFATE HFA 108 (90 BASE) MCG/ACT IN AERS: 2.0000 | INHALATION_SPRAY | RESPIRATORY_TRACT | 1 refills | Status: DC | PRN

## 2018-03-31 NOTE — Progress Notes (Signed)
FOLLOW UP  Date of Service/Encounter:  03/31/18   Assessment:   Moderate persistent asthma, uncomplicated - on Fasenra every 8 weeks  Chronic allergic rhinitis (last testing performed in [place])  Acute sinusitis    Asthma Reportables:  Severity: moderate persistent  Risk: high Control: not well controlled   Plan/Recommendations:   1. Chronic nonseasonal allergic rhinitis - Continue with Dymista 1-2 sprays per nostril daily as needed.  - Stop the Singulair and the cetirizine since you are doing fine without them.   2. Moderate persistent asthma, uncomplicated - Lung testing looked normal. - We will change you from Breo down to Arnuity, which should hopefully should be cheaper. - Harrington Challenger is not meant to be a monontherapy. - Daily controller medication(s): Arnuity one puff once daily. - Rescue medications: ProAir 4 puffs every 4-6 hours as needed - Asthma control goals:  * Full participation in all desired activities (may need albuterol before activity) * Albuterol use two time or less a week on average (not counting use with activity) * Cough interfering with sleep two time or less a month * Oral steroids no more than once a year * No hospitalizations  3. Acute sinusitis - Start the packet of prednisone provided in clinic today. - Start Augmentin 875mg  one tablet twice daily for 14 days.   4. Return in about 6 months (around 09/29/2018).  Subjective:   Betty Golden is a 72 y.o. female presenting today for follow up of  Chief Complaint  Patient presents with  . Nasal Congestion    Increased  sinus congestion x 5 days  . Cough    Productive    68 Betty Golden has a history of the following: Patient Active Problem List   Diagnosis Date Noted  . Fibromyalgia 09/08/2017  . Spinal stenosis of lumbar region 09/20/2015  . 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 Moderate persistent asthma 07/03/2015  . Allergic rhinitis 07/03/2015  . Colitis due to  Clostridium difficile 02/10/2015  . Abdominal pain 02/10/2015  . Nausea and vomiting 02/10/2015  . Acute renal failure ([facility]) 02/10/2015  . Dehydration 02/10/2015  . Hypoxia 02/10/2015  . Hyponatremia 02/10/2015  . Hypokalemia 02/10/2015  . COPD (chronic obstructive pulmonary disease) ([facility])   . Unspecified constipation 07/15/2013  . DYSPNEA 04/05/2008  . Essential hypertension 03/10/2008  . Headache(784.0) 03/10/2008  . Cough 03/10/2008    History obtained from: chart review and patient.  03/12/2008 Reader's Primary Care Provider is Betty Silvas, MD.     Betty Golden is a 72 y.o. female presenting for a sick visit.  She was last seen in October 2018.  At that time, her asthma looked slightly lower.  We did give her a DuoNeb treatment as well as a prednisone burst.  We continued her on Breo 200/25 mcg 1 puff once daily as well as Pro-Air as needed.  We also obtain some lab work, which qualified her for 1 of our anti-IL-5 medications.  She has since starting [place] and has done very well on this.  At the last visit, we also treated her for sinusitis with Augmentin 875 twice daily for 14 days.  For her allergies, we continue Nasonex 2 sprays per nostril daily, Astelin 2 sprays per nostril daily, Singulair 10 mg daily, and cetirizine 10 mg daily.  Since the last visit, she has done very well. She is happy with the [place] and has never felt better since starting it.  However, over the last 5 days, she  has developed worsening sinus pain and pressure.  She has felt feverish, although she has no fever today.  She has been using her nasal sprays more religiously as well as nasal saline rinses with minimal improvement.  Her last antibiotic was around 1 year ago when I saw her in October 2018.  Asthma/Respiratory Symptom History: She was on Breo, but she has been out of it. She is also out of ProAir.  Since she was doing so well on the [place], she never bothered to ask for refills.  She denies nighttime  coughing, daytime coughing or wheezing, or courses of prednisone.  She has had no ER visits for her breathing.  Allergic Rhinitis Symptom History: She has been out of Astelin and Flonase, but she did not get them refilled at all since she was doing well. These were $25 each as well.  We have not retested her since she started seeing [place].  She was last tested in [place].  She was on shots for period of time, but never felt that they provided much relief.  Otherwise, there have been no changes to her past medical history, surgical history, family history, or social history.    Review of Systems: a 14-point review of systems is pertinent for what is mentioned in HPI.  Otherwise, all other systems were negative. Constitutional: negative other than that listed in the HPI Eyes: negative other than that listed in the HPI Ears, nose, mouth, throat, and face: negative other than that listed in the HPI Respiratory: negative other than that listed in the HPI Cardiovascular: negative other than that listed in the HPI Gastrointestinal: negative other than that listed in the HPI Genitourinary: negative other than that listed in the HPI Integument: negative other than that listed in the HPI Hematologic: negative other than that listed in the HPI Musculoskeletal: negative other than that listed in the HPI Neurological: negative other than that listed in the HPI Allergy/Immunologic: negative other than that listed in the HPI    Objective:   Blood pressure 140/76, pulse 88, temperature 97.8 F (36.6 C), temperature source Oral, resp. rate 18. There is no height or weight on file to calculate BMI.   Physical Exam:  General: Alert, interactive, in no acute distress.  Pleasant talkative female.  Very appreciative. Eyes: No conjunctival injection bilaterally, no discharge on the right, no discharge on the left and no Horner-Trantas dots present. PERRL bilaterally. EOMI without pain. No  photophobia.  Ears: Right TM pearly gray with normal light reflex, Left TM pearly gray with normal light reflex, Right TM intact without perforation and Left TM intact without perforation.  Nose/Throat: External nose within normal limits and septum midline. Turbinates edematous with thick discharge. Posterior oropharynx erythematous with cobblestoning in the posterior oropharynx. Tonsils 2+ without exudates.  Tongue without thrush.  Bilateral frontal sinus tenderness as well as maxillary sinus tenderness. Lungs: Clear to auscultation without wheezing, rhonchi or rales. No increased work of breathing. CV: Normal S1/S2. No murmurs. Capillary refill <2 seconds.  Skin: Warm and dry, without lesions or rashes. Neuro:   Grossly intact. No focal deficits appreciated. Responsive to questions.  Diagnostic studies: deferred since she was sick today      [name], MD  [facility]

## 2018-03-31 NOTE — Patient Instructions (Addendum)
1. Chronic nonseasonal allergic rhinitis - Continue with Dymista 1-2 sprays per nostril daily as needed.  - Stop the Singulair and the cetirizine since you are doing fine without them.   2. Moderate persistent asthma, uncomplicated - Lung testing looked normal. - We will change you from Breo down to Arnuity, which should hopefully should be cheaper. - Harrington Challenger is not meant to be a monontherapy. - Daily controller medication(s): Arnuity one puff once daily. - Rescue medications: ProAir 4 puffs every 4-6 hours as needed - Asthma control goals:  * Full participation in all desired activities (may need albuterol before activity) * Albuterol use two time or less a week on average (not counting use with activity) * Cough interfering with sleep two time or less a month * Oral steroids no more than once a year * No hospitalizations  3. Acute sinusitis - Start the packet of prednisone provided in clinic today. - Start Augmentin 875mg  one tablet twice daily for 14 days.   4. Return in about 6 months (around 09/29/2018).   Please inform 11/29/2018 of any Emergency Department visits, hospitalizations, or changes in symptoms. Call us before going to the ED for breathing or allergy symptoms since we might be able to fit you in for a sick visit. Feel free to contact us anytime with any questions, problems, or concerns.  It was a pleasure to see you again today!  Websites that have reliable patient information: 1. American Academy of Asthma, Allergy, and Immunology: www.aaaai.org 2. Food Allergy Research and Education (FARE): foodallergy.org 3. Mothers of Asthmatics: http://www.asthmacommunitynetwork.org 4. American College of Allergy, Asthma, and Immunology: [place]   Make sure you are registered to vote! If you have moved or changed any of your contact information, you will need to get this updated before voting!    **Please note: [facility] in [place]  will be moving to [phone] ([address]) as of mid October 2019! Be sure to call the office to confirm the location in the future!  ]

## 2018-04-02 ENCOUNTER — Telehealth: Payer: Self-pay | Admitting: Allergy & Immunology

## 2018-04-02 MED ORDER — AZELASTINE-FLUTICASONE 137-50 MCG/ACT NA SUSP: 2.0000 | Freq: Two times a day (BID) | NASAL | 5 refills | Status: DC

## 2018-04-02 NOTE — Telephone Encounter (Signed)
Called and left message informing patient that RX has been sent in.

## 2018-04-02 NOTE — Telephone Encounter (Signed)
Rowyn called stating the pharmacy did not get a RX for dymistia.  [facility] in [place]

## 2018-04-14 ENCOUNTER — Ambulatory Visit: Payer: Medicare Other

## 2018-04-21 ENCOUNTER — Ambulatory Visit: Payer: Medicare Other | Admitting: Allergy & Immunology

## 2018-04-21 ENCOUNTER — Ambulatory Visit: Payer: Medicare Other

## 2018-04-21 ENCOUNTER — Encounter: Payer: Self-pay | Admitting: Allergy & Immunology

## 2018-04-21 VITALS — BP 128/70 | HR 90 | Temp 97.8°F | Resp 18

## 2018-04-21 DIAGNOSIS — J4540 Moderate persistent asthma, uncomplicated: Secondary | ICD-10-CM

## 2018-04-21 DIAGNOSIS — J0141 Acute recurrent pansinusitis: Secondary | ICD-10-CM | POA: Diagnosis not present

## 2018-04-21 DIAGNOSIS — J310 Chronic rhinitis: Secondary | ICD-10-CM

## 2018-04-21 DIAGNOSIS — J4550 Severe persistent asthma, uncomplicated: Secondary | ICD-10-CM

## 2018-04-21 MED ORDER — FLUTICASONE FUROATE 100 MCG/ACT IN AEPB: 1.0000 | INHALATION_SPRAY | Freq: Every day | RESPIRATORY_TRACT | 5 refills | Status: DC

## 2018-04-21 MED ORDER — LEVOFLOXACIN 500 MG PO TABS: 500.0000 mg | ORAL_TABLET | Freq: Every day | ORAL | 0 refills | Status: AC

## 2018-04-21 MED ORDER — LEVOFLOXACIN 500 MG PO TABS: 500.0000 mg | ORAL_TABLET | Freq: Every day | ORAL | 0 refills | Status: DC

## 2018-04-21 NOTE — Progress Notes (Signed)
FOLLOW UP  Date of Service/Encounter:  04/21/18   Assessment:   Moderate persistent asthma, uncomplicated   Acute recurrent pansinusitis   Chronic allergic rhinitis   Plan/Recommendations:   1. Chronic nonseasonal allergic rhinitis - Stop the Dymista and start fluticasone two sprays per nostril up to twice daily ($21 on Amazon for five bottles for [employer] brand).  - Stop the Singulair.  - Continue with cetirizine (Zyrtec).   2. Moderate persistent asthma, uncomplicated - Lung testing looked normal. - We will not make any changes today.  - Daily controller medication(s): Arnuity one puff once daily + Fasenra every 8 weeks - Rescue medications: ProAir 4 puffs every 4-6 hours as needed - Asthma control goals:  * Full participation in all desired activities (may need albuterol before activity) * Albuterol use two time or less a week on average (not counting use with activity) * Cough interfering with sleep two time or less a month * Oral steroids no more than once a year * No hospitalizations  3. Acute sinusitis - Do not start the Keflex since the coverage for this is similar to Augmentin. - Start Levquin 500mg  daily for 10 days.  - Start prednisone pack provided today. - Start pseudoephedrine every 12 hours (available over the counter). - We may need a sinus CT if there is no improvement.   4. Return in about 6 months (around 10/20/2018).  Subjective:   Betty Golden is a 72 y.o. female presenting today for follow up of  Chief Complaint  Patient presents with  . Sinusitis    3 weeks    68 has a history of the following: Patient Active Problem List   Diagnosis Date Noted  . Fibromyalgia 09/08/2017  . Spinal stenosis of lumbar region 09/20/2015  . 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 Moderate persistent asthma 07/03/2015  . Allergic rhinitis 07/03/2015  . Colitis due to Clostridium difficile 02/10/2015  . Abdominal pain 02/10/2015  .  Nausea and vomiting 02/10/2015  . Acute renal failure (HCC) 02/10/2015  . Dehydration 02/10/2015  . Hypoxia 02/10/2015  . Hyponatremia 02/10/2015  . Hypokalemia 02/10/2015  . COPD (chronic obstructive pulmonary disease) (HCC)   . Unspecified constipation 07/15/2013  . DYSPNEA 04/05/2008  . Essential hypertension 03/10/2008  . Headache(784.0) 03/10/2008  . Cough 03/10/2008    History obtained from: chart review and patient.  03/12/2008 Kerkman's Primary Care Provider is Manya Silvas, MD.     Betty Golden is a 72 y.o. female presenting for a follow up visit.  She was last seen in September 2019 earlier this month.  At that time, we continue her Dymista and stop the Singulair and cetirizine since she was doing fine without them.  Her lung testing looked normal.  We changed her from [facility] down to Arnuity because her asthma was under good control and Breo was too expensive for her.  We did diagnose her with acute sinusitis and started her on prednisone as well as Augmentin for 2 weeks.  We continued her on Fasenra.  Since the last visit, she has continued to have sinus tenderness and nasal discharge.  She is using nasal rinses.  She has a few more days of her Augmentin left and she completed her prednisone.  She has felt minimal relief with this.  She has not developed a fever.  She did go to see her primary care provider earlier this week and was given a prescription for Keflex.  She did fill it  but she did not start it until she saw [place].  She has not had a sinus CT in quite some time, but review of her sinus history reveals that she was actually followed at [facility] for nasal steroid injections due to her history of sinusitis.  She has been asymptomatic for several years now, however until the most recent sinus infection a few weeks ago.  Asthma has been well controlled with [facility] and the Arnuity.  Unfortunately, the Arnuity is just as expensive as the [facility], she is willing to remain on it.  She does have  a rescue inhaler which she does not use.  She does not feel that her current illness has affected her asthma at all at this point.  Otherwise, there have been no changes to her past medical history, surgical history, family history, or social history.    Review of Systems: a 14-point review of systems is pertinent for what is mentioned in HPI.  Otherwise, all other systems were negative. Constitutional: negative other than that listed in the HPI Eyes: negative other than that listed in the HPI Ears, nose, mouth, throat, and face: negative other than that listed in the HPI Respiratory: negative other than that listed in the HPI Cardiovascular: negative other than that listed in the HPI Gastrointestinal: negative other than that listed in the HPI Genitourinary: negative other than that listed in the HPI Integument: negative other than that listed in the HPI Hematologic: negative other than that listed in the HPI Musculoskeletal: negative other than that listed in the HPI Neurological: negative other than that listed in the HPI Allergy/Immunologic: negative other than that listed in the HPI    Objective:   Blood pressure 128/70, pulse 90, temperature 97.8 F (36.6 C), temperature source Oral, resp. rate 18, SpO2 97 %. There is no height or weight on file to calculate BMI.   Physical Exam:  General: Alert, interactive, in no acute distress. Talkative.  Eyes: No conjunctival injection bilaterally, no discharge on the right, no discharge on the left and no Horner-Trantas dots present. PERRL bilaterally. EOMI without pain. No photophobia.  Ears: Right TM pearly gray with normal light reflex, Left TM pearly gray with normal light reflex, Right TM intact without perforation and Left TM intact without perforation.  Nose/Throat: External nose within normal limits and septum midline. Turbinates markedly edematous with clear discharge. Posterior oropharynx mildly erythematous without  cobblestoning in the posterior oropharynx. Tonsils 2+ without exudates.  Tongue without thrush. Lungs: Clear to auscultation without wheezing, rhonchi or rales. No increased work of breathing. CV: Normal S1/S2. No murmurs. Capillary refill <2 seconds.  Skin: Warm and dry, without lesions or rashes. Neuro:   Grossly intact. No focal deficits appreciated. Responsive to questions.  Diagnostic studies:   Spirometry: results normal (FEV1: 1.55/90%, FVC: 2.23/92%, FEV1/FVC: 71%).    Spirometry consistent with normal pattern.   Allergy Studies: none      [name], MD  [facility]

## 2018-04-21 NOTE — Patient Instructions (Addendum)
1. Chronic nonseasonal allergic rhinitis - Stop the Dymista and start fluticasone two sprays per nostril up to twice daily ($21 on Amazon for five bottles for [employer] brand).  - Stop the Singulair.  - Continue with cetirizine (Zyrtec).   2. Moderate persistent asthma, uncomplicated - Lung testing looked normal. - We will not make any changes today.  - Daily controller medication(s): Arnuity one puff once daily + Fasenra every 8 weeks - Rescue medications: ProAir 4 puffs every 4-6 hours as needed - Asthma control goals:  * Full participation in all desired activities (may need albuterol before activity) * Albuterol use two time or less a week on average (not counting use with activity) * Cough interfering with sleep two time or less a month * Oral steroids no more than once a year * No hospitalizations  3. Acute sinusitis - Start Levquin 500mg  daily for 10 days.  - Start prednisone pack provided today. - Start pseudoephedrine every 12 hours (available over the counter). - We may need a sinus CT if there is no improvement.   4. Return in about 6 months (around 10/20/2018).   Please inform 10/22/2018 of any Emergency Department visits, hospitalizations, or changes in symptoms. Call us before going to the ED for breathing or allergy symptoms since we might be able to fit you in for a sick visit. Feel free to contact us anytime with any questions, problems, or concerns.  It was a pleasure to see you again today!  Websites that have reliable patient information: 1. American Academy of Asthma, Allergy, and Immunology: www.aaaai.org 2. Food Allergy Research and Education (FARE): foodallergy.org 3. Mothers of Asthmatics: http://www.asthmacommunitynetwork.org 4. American College of Allergy, Asthma, and Immunology: [place]   Make sure you are registered to vote! If you have moved or changed any of your contact information, you will need to get this updated before  voting!    **Please note: [facility] in [place] will be moving to [phone] as of mid October 2019! Be sure to call the office to confirm the location in the future!  ]

## 2018-04-29 ENCOUNTER — Ambulatory Visit (INDEPENDENT_AMBULATORY_CARE_PROVIDER_SITE_OTHER): Payer: Medicare Other | Admitting: Internal Medicine

## 2018-04-30 ENCOUNTER — Ambulatory Visit (INDEPENDENT_AMBULATORY_CARE_PROVIDER_SITE_OTHER): Payer: Medicare Other | Admitting: Internal Medicine

## 2018-05-05 ENCOUNTER — Encounter (INDEPENDENT_AMBULATORY_CARE_PROVIDER_SITE_OTHER): Payer: Self-pay | Admitting: Internal Medicine

## 2018-05-05 ENCOUNTER — Ambulatory Visit (INDEPENDENT_AMBULATORY_CARE_PROVIDER_SITE_OTHER): Payer: Medicare Other | Admitting: Internal Medicine

## 2018-05-05 VITALS — BP 180/92 | HR 84 | Temp 98.2°F | Ht 64.0 in | Wt 170.2 lb

## 2018-05-05 DIAGNOSIS — K648 Other hemorrhoids: Secondary | ICD-10-CM | POA: Diagnosis not present

## 2018-05-05 DIAGNOSIS — K625 Hemorrhage of anus and rectum: Secondary | ICD-10-CM

## 2018-05-05 LAB — HEMOGLOBIN AND HEMATOCRIT, BLOOD
HEMATOCRIT: 33 % — AB (ref 35.0–45.0)
HEMOGLOBIN: 11.1 g/dL — AB (ref 11.7–15.5)

## 2018-05-05 MED ORDER — HYDROCORTISONE ACE-PRAMOXINE 1-1 % RE FOAM: 1.0000 | Freq: Two times a day (BID) | RECTAL | 0 refills | Status: DC

## 2018-05-05 NOTE — Patient Instructions (Addendum)
3 stools cards home with patient. H and H today Rx for Proctofoam sent to her pharmacy

## 2018-05-05 NOTE — Progress Notes (Signed)
Subjective:    Patient ID: Betty Golden, female    DOB: 06-08-1946, 72 y.o.   MRN: [id number]  HPI Here today for f/u. Last seen in October of 2018. Hx of C-diff and salmonella back in 2016. At [facility] in October, she was for rectal bleeding. Had been taking excessive amt of BC powders. Her CBC was normal. Stool cards x 3 were negative. She tells me her WBC ct is elevated.  She thinks she had a sinus infection.  She says she has had some rectal bleeding recently. Has noticed her hemorrhoids bleeding. She says she sees blood about every day. Her appetite is okay. She has gained 4 pounds since her last visit.  Has a BM daily.  She is not exercising. She is not motivated.   08/19/2013 Colonoscopy Indications: Patient is 72 year old African American female who is undergoing screening colonoscopy. Her last exam was 10 years ago. She does report having had hematochezia since her colonoscopy was scheduled.  Impression:  Examination performed to cecum. 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 Internal/external hemorrhoids.  Review of Systems Past Medical History:  Diagnosis Date  . Arthritis   . Asthma   . C. difficile colitis   . Cervical stenosis of spine   . Chronic headaches   . Chronic neck pain   . Depression   . Diverticulosis   . Essential hypertension   . Fibromyalgia   . GERD (gastroesophageal reflux disease)   . History of Salmonella gastroenteritis   . HNP (herniated nucleus pulposus), lumbar   . Hyperlipidemia   . IBS (irritable bowel syndrome)   . Lung nodule     Past Surgical History:  Procedure Laterality Date  . APPENDECTOMY  1965  . BREAST BIOPSY    . BREAST REDUCTION SURGERY  1994  . CHOLECYSTECTOMY  1975  . COLONOSCOPY N/A 08/19/2013   Procedure: COLONOSCOPY;  Surgeon: Malissa Hippo, MD;  Location: [facility];  Service: Endoscopy;  Laterality: N/A;  155-moved to 140 Ann to notify pt  . LUMBAR  LAMINECTOMY/DECOMPRESSION MICRODISCECTOMY Bilateral 09/20/2015   Procedure: MICRO LUMBAR DECOMPRESSION L5-S1 BILATERAL    (1 LEVEL);  Surgeon: [name], MD;  Location: [facility];  Service: Orthopedics;  Laterality: Bilateral;  . Sinus Surgery    . TONSILLECTOMY  1968    Allergies  Allergen Reactions  . Sulfa Antibiotics Rash    Current Outpatient Medications on File Prior to Visit  Medication Sig Dispense Refill  . acetaminophen (TYLENOL) 500 MG tablet Take 1 tablet (500 mg total) by mouth every 6 (six) hours as needed for mild pain. Do not exceed 4 grams of acetaminophen daily in combination with Norco  0  . albuterol (PROAIR HFA) 108 (90 Base) MCG/ACT inhaler Inhale 2 puffs into the lungs every 4 (four) hours as needed for wheezing or shortness of breath. 1 Inhaler 1  . Azelastine-Fluticasone 137-50 MCG/ACT SUSP Place 2 sprays into the nose 2 (two) times daily. 1 Bottle 5  . DULoxetine (CYMBALTA) 20 MG capsule Take 20 mg by mouth 2 (two) times daily.    . Fluticasone Furoate (ARNUITY ELLIPTA) 100 MCG/ACT AEPB Inhale 1 Dose into the lungs daily. 30 each 5  . folic acid (FOLVITE) 1 MG tablet Take 1 mg by mouth daily.    Marland Kitchen gabapentin (NEURONTIN) 100 MG capsule Take 100 mg by mouth 2 (two) times daily.     Marland Kitchen losartan-hydrochlorothiazide (HYZAAR) 100-12.5 MG per tablet Take 1 tablet by mouth  daily.    . methotrexate 2.5 MG tablet Take 10 mg by mouth once a week. For 30 days    . simvastatin (ZOCOR) 20 MG tablet Take 20 mg by mouth at bedtime.      Current Facility-Administered Medications on File Prior to Visit  Medication Dose Route Frequency Provider Last Rate Last Dose  . Benralizumab SOSY 30 mg  30 mg Subcutaneous Q28 days Alfonse Spruce, MD   30 mg at 04/21/18 1046        Objective:   Physical Exam Blood pressure (!) 180/92, pulse 84, temperature 98.2 F (36.8 C), height 5\' 4"  (1.626 m), weight 170 lb 3.2 oz (77.2 kg). Alert and oriented. Skin warm and dry. Oral mucosa is  moist.   . Sclera anicteric, conjunctivae is pink. Thyroid not enlarged. No cervical lymphadenopathy. Lungs clear. Heart regular rate and rhythm.  Abdomen is soft. Bowel sounds are positive. No hepatomegaly. No abdominal masses felt. No tenderness.  No edema to lower extremities.  Rectal exam: No masses, guaiac negative. Hemorroids noted on rectal exam. Ambulates without assistance. Usually walks with a cane.        Assessment & Plan:  Rectal bleeding with her BMs. Stool was guaiac negative today.  Probable hemorroidal bleeding. Am going to send Rx for Proctofoam to her pharmacy. 3 stools cards home with patient. H and H today. Will get recent labs from her PCP [facility] in 1 year.

## 2018-05-12 ENCOUNTER — Telehealth (INDEPENDENT_AMBULATORY_CARE_PROVIDER_SITE_OTHER): Payer: Self-pay | Admitting: *Deleted

## 2018-05-12 NOTE — Telephone Encounter (Signed)
   Diagnosis:    Result(s)   Card 1:Negative: 05/05/18    Card 2:Negative:  05/06/18   Card 3:Negative: 05/07/18    Completed by: Larose Hires ,LPN   HEMOCCULT SENSA DEVELOPER: LOT#:  [id number] AS EXPIRATION DATE: 2021-11   HEMOCCULT SENSA CARD:  LOT#:  [id number] EXPIRATION DATE: 08/21   CARD CONTROL RESULTS:  POSITIVE: Positive NEGATIVE: Negative    ADDITIONAL COMMENTS: Results forwarded to Delrae Rend ordering provider. Patient has not been called.

## 2018-05-13 NOTE — Telephone Encounter (Signed)
Please let patient know her stool cards were negative.

## 2018-05-13 NOTE — Telephone Encounter (Signed)
Patient was called and given results. 

## 2018-06-17 ENCOUNTER — Ambulatory Visit (INDEPENDENT_AMBULATORY_CARE_PROVIDER_SITE_OTHER): Payer: Medicare Other | Admitting: *Deleted

## 2018-06-17 DIAGNOSIS — J4550 Severe persistent asthma, uncomplicated: Secondary | ICD-10-CM

## 2018-06-17 DIAGNOSIS — J4540 Moderate persistent asthma, uncomplicated: Secondary | ICD-10-CM

## 2018-06-19 ENCOUNTER — Emergency Department (HOSPITAL_COMMUNITY): Payer: Medicare Other

## 2018-06-19 ENCOUNTER — Encounter (HOSPITAL_COMMUNITY): Payer: Self-pay | Admitting: Emergency Medicine

## 2018-06-19 ENCOUNTER — Other Ambulatory Visit: Payer: Self-pay

## 2018-06-19 ENCOUNTER — Emergency Department (HOSPITAL_COMMUNITY)
Admission: EM | Admit: 2018-06-19 | Discharge: 2018-06-20 | Disposition: A | Discharge status: Home / Self Care | Payer: Medicare Other | Attending: Emergency Medicine | Admitting: Emergency Medicine

## 2018-06-19 DIAGNOSIS — K2960 Other gastritis without bleeding: Secondary | ICD-10-CM | POA: Insufficient documentation

## 2018-06-19 DIAGNOSIS — Z79899 Other long term (current) drug therapy: Secondary | ICD-10-CM | POA: Insufficient documentation

## 2018-06-19 DIAGNOSIS — K2900 Acute gastritis without bleeding: Secondary | ICD-10-CM

## 2018-06-19 DIAGNOSIS — R1013 Epigastric pain: Secondary | ICD-10-CM | POA: Diagnosis present

## 2018-06-19 DIAGNOSIS — Z87891 Personal history of nicotine dependence: Secondary | ICD-10-CM | POA: Insufficient documentation

## 2018-06-19 DIAGNOSIS — J45909 Unspecified asthma, uncomplicated: Secondary | ICD-10-CM | POA: Diagnosis not present

## 2018-06-19 DIAGNOSIS — I10 Essential (primary) hypertension: Secondary | ICD-10-CM | POA: Diagnosis not present

## 2018-06-19 LAB — CBC
HCT: 36 % (ref 36.0–46.0)
Hemoglobin: 11.4 g/dL — ABNORMAL LOW (ref 12.0–15.0)
MCH: 28.9 pg (ref 26.0–34.0)
MCHC: 31.7 g/dL (ref 30.0–36.0)
MCV: 91.1 fL (ref 80.0–100.0)
Platelets: 399 10*3/uL (ref 150–400)
RBC: 3.95 MIL/uL (ref 3.87–5.11)
RDW: 15.3 % (ref 11.5–15.5)
WBC: 14.4 10*3/uL — AB (ref 4.0–10.5)
nRBC: 0 % (ref 0.0–0.2)

## 2018-06-19 LAB — COMPREHENSIVE METABOLIC PANEL
ALK PHOS: 60 U/L (ref 38–126)
ALT: 16 U/L (ref 0–44)
AST: 18 U/L (ref 15–41)
Albumin: 4.1 g/dL (ref 3.5–5.0)
Anion gap: 12 (ref 5–15)
BILIRUBIN TOTAL: 0.9 mg/dL (ref 0.3–1.2)
BUN: 13 mg/dL (ref 8–23)
CALCIUM: 9.5 mg/dL (ref 8.9–10.3)
CO2: 31 mmol/L (ref 22–32)
CREATININE: 0.83 mg/dL (ref 0.44–1.00)
Chloride: 100 mmol/L (ref 98–111)
GFR calc Af Amer: >60 mL/min (ref >60)
Glucose, Bld: 99 mg/dL (ref 70–99)
Potassium: 2.7 mmol/L — CL (ref 3.5–5.1)
Sodium: 143 mmol/L (ref 135–145)
TOTAL PROTEIN: 7.7 g/dL (ref 6.5–8.1)

## 2018-06-19 LAB — LIPASE, BLOOD: Lipase: 27 U/L (ref 11–51)

## 2018-06-19 MED ORDER — POTASSIUM CHLORIDE CRYS ER 20 MEQ PO TBCR
40.0000 meq | EXTENDED_RELEASE_TABLET | Freq: Once | ORAL | Status: AC
  Administered 2018-06-19: 40 meq via ORAL
  Filled 2018-06-19: qty 2

## 2018-06-19 MED ORDER — DICYCLOMINE HCL 20 MG PO TABS: ORAL_TABLET | ORAL | 0 refills | Status: DC

## 2018-06-19 MED ORDER — METHYLPREDNISOLONE SODIUM SUCC 125 MG IJ SOLR
125.0000 mg | Freq: Once | INTRAMUSCULAR | Status: AC
  Administered 2018-06-19: 125 mg via INTRAMUSCULAR
  Filled 2018-06-19: qty 2

## 2018-06-19 MED ORDER — POTASSIUM CHLORIDE 10 MEQ/100ML IV SOLN
10.0000 meq | Freq: Once | INTRAVENOUS | Status: AC
  Administered 2018-06-19: 10 meq via INTRAVENOUS
  Filled 2018-06-19: qty 100

## 2018-06-19 MED ORDER — POTASSIUM CHLORIDE ER 10 MEQ PO TBCR: 10.0000 meq | EXTENDED_RELEASE_TABLET | Freq: Every day | ORAL | 0 refills | Status: DC

## 2018-06-19 MED ORDER — PANTOPRAZOLE SODIUM 20 MG PO TBEC: 20.0000 mg | DELAYED_RELEASE_TABLET | Freq: Every day | ORAL | 0 refills | Status: DC

## 2018-06-19 MED ORDER — IOPAMIDOL (ISOVUE-300) INJECTION 61%
100.0000 mL | Freq: Once | INTRAVENOUS | Status: AC | PRN
  Administered 2018-06-19: 100 mL via INTRAVENOUS

## 2018-06-19 MED ORDER — ONDANSETRON 4 MG PO TBDP: ORAL_TABLET | ORAL | 0 refills | Status: DC

## 2018-06-19 NOTE — ED Notes (Signed)
CRITICAL VALUE ALERT  Critical Value:  Potassium 2.7  Date & Time Notied:  06/19/18 & 2019 hrs  Provider Notified: Dr. Estell Harpin  Orders Received/Actions taken: N/A

## 2018-06-19 NOTE — Discharge Instructions (Addendum)
Follow-up with Dr. Dionicia Abler in the next week.  Follow-up with your family doctor next week to check your potassium return if any problems

## 2018-06-19 NOTE — ED Provider Notes (Signed)
[facility] EMERGENCY DEPARTMENT Provider Note   CSN: [id number] Arrival date & time: 06/19/18  1835     History   Chief Complaint Chief Complaint  Patient presents with  . Abdominal Pain    HPI NASHAE Golden is a 72 y.o. female.                Patient complains of vomiting x1 and 1 long episode of diarrhea.  She also has epigastric discomfort  The history is provided by the patient. No language interpreter was used.  Abdominal Pain   This is a new problem. The current episode started 12 to 24 hours ago. The problem occurs constantly. The problem has not changed since onset.The pain is associated with an unknown factor. The pain is located in the epigastric region. The quality of the pain is aching. The pain is at a severity of 4/10. The pain is moderate. Associated symptoms include diarrhea. Pertinent negatives include anorexia, frequency, hematuria and headaches.    Past Medical History:  Diagnosis Date  . Arthritis   . Asthma   . C. difficile colitis   . Cervical stenosis of spine   . Chronic headaches   . Chronic neck pain   . Depression   . Diverticulosis   . Essential hypertension   . Fibromyalgia   . GERD (gastroesophageal reflux disease)   . History of Salmonella gastroenteritis   . HNP (herniated nucleus pulposus), lumbar   . Hyperlipidemia   . IBS (irritable bowel syndrome)   . Lung nodule     Patient Active Problem List   Diagnosis Date Noted  . Fibromyalgia 09/08/2017  . Spinal stenosis of lumbar region 09/20/2015  . 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 Moderate persistent asthma 07/03/2015  . Allergic rhinitis 07/03/2015  . Colitis due to Clostridium difficile 02/10/2015  . Abdominal pain 02/10/2015  . Nausea and vomiting 02/10/2015  . Acute renal failure (HCC) 02/10/2015  . Dehydration 02/10/2015  . Hypoxia 02/10/2015  . Hyponatremia 02/10/2015  . Hypokalemia 02/10/2015  . COPD (chronic obstructive pulmonary disease) (HCC)   . Unspecified  constipation 07/15/2013  . DYSPNEA 04/05/2008  . Essential hypertension 03/10/2008  . Headache(784.0) 03/10/2008  . Cough 03/10/2008    Past Surgical History:  Procedure Laterality Date  . APPENDECTOMY  1965  . BREAST BIOPSY    . BREAST REDUCTION SURGERY  1994  . CHOLECYSTECTOMY  1975  . COLONOSCOPY N/A 08/19/2013   Procedure: COLONOSCOPY;  Surgeon: Malissa Hippo, MD;  Location: [facility] SUITE;  Service: Endoscopy;  Laterality: N/A;  155-moved to 140 Ann to notify pt  . LUMBAR LAMINECTOMY/DECOMPRESSION MICRODISCECTOMY Bilateral 09/20/2015   Procedure: MICRO LUMBAR DECOMPRESSION L5-S1 BILATERAL    (1 LEVEL);  Surgeon: [name], MD;  Location: [facility];  Service: Orthopedics;  Laterality: Bilateral;  . Sinus Surgery    . TONSILLECTOMY  1968     OB History   None      Home Medications    Prior to Admission medications   Medication Sig Start Date End Date Taking? Authorizing Provider  acetaminophen (TYLENOL) 500 MG tablet Take 1 tablet (500 mg total) by mouth every 6 (six) hours as needed for mild pain. Do not exceed 4 grams of acetaminophen daily in combination with Norco 09/21/15   Dorothy Spark, PA-C  albuterol (PROAIR HFA) 108 (90 Base) MCG/ACT inhaler Inhale 2 puffs into the lungs every 4 (four) hours as needed for wheezing or shortness of breath. 03/31/18   [name]  Louis, MD  Azelastine-Fluticasone 137-50 MCG/ACT SUSP Place 2 sprays into the nose 2 (two) times daily. 04/02/18   Alfonse Spruce, MD  dicyclomine (BENTYL) 20 MG tablet Take 1 every 8 hours as needed for abdominal cramping 06/19/18   Bethann Berkshire, MD  DULoxetine (CYMBALTA) 20 MG capsule Take 20 mg by mouth 2 (two) times daily.    [provider]  Fluticasone Furoate (ARNUITY ELLIPTA) 100 MCG/ACT AEPB Inhale 1 Dose into the lungs daily. 04/21/18   Alfonse Spruce, MD  folic acid (FOLVITE) 1 MG tablet Take 1 mg by mouth daily.    [provider]  gabapentin (NEURONTIN) 100 MG  capsule Take 100 mg by mouth 2 (two) times daily.       hydrocortisone-pramoxine (PROCTOFOAM HC) rectal foam Place 1 applicator rectally 2 (two) times daily. 05/05/18   Setzer, Brand Males, NP  losartan-hydrochlorothiazide (HYZAAR) 100-12.5 MG per tablet Take 1 tablet by mouth daily.    [provider]  methotrexate 2.5 MG tablet Take 10 mg by mouth once a week. For 30 days      ondansetron (ZOFRAN ODT) 4 MG disintegrating tablet 4mg  ODT q4 hours prn nausea/vomit 06/19/18   06/21/18, MD  pantoprazole (PROTONIX) 20 MG tablet Take 1 tablet (20 mg total) by mouth daily. 06/19/18   06/21/18, MD  potassium chloride (K-DUR) 10 MEQ tablet Take 1 tablet (10 mEq total) by mouth daily. 06/19/18   06/21/18, MD  simvastatin (ZOCOR) 20 MG tablet Take 20 mg by mouth at bedtime.     [provider]    Family History Family History  Problem Relation Age of Onset  . Heart disease Mother   . Hypertension Mother   . Asthma Mother   . Heart disease Father   . Stroke Father   . Hypertension Father   . Stroke Sister   . Asthma Sister   . Hypertension Brother   . Asthma Brother   . Asthma Maternal Grandmother   . Heart disease Maternal Grandmother   . Diabetes Maternal Grandfather   . Colon cancer Neg Hx     Social History Social History   Tobacco Use  . Smoking status: Former Smoker    Packs/day: 2.00    Years: 10.00    Pack years: 20.00    Types: Cigarettes    Last attempt to quit: 09/11/1988    Years since quitting: 29.7  . Smokeless tobacco: Never Used  . Tobacco comment: quit 1990  Substance Use Topics  . Alcohol use: No    Comment: stopped in 1990  . Drug use: No    Comment: hx smoking marijuana and cocaine weekends. Stopped in the 1990s.     Allergies   Sulfa antibiotics   Review of Systems Review of Systems  Constitutional: Negative for appetite change and fatigue.  HENT: Negative for congestion, ear discharge and sinus pressure.   Eyes:  Negative for discharge.  Respiratory: Negative for cough.   Cardiovascular: Negative for chest pain.  Gastrointestinal: Positive for abdominal pain and diarrhea. Negative for anorexia.  Genitourinary: Negative for frequency and hematuria.  Musculoskeletal: Negative for back pain.  Skin: Negative for rash.  Neurological: Negative for seizures and headaches.  Psychiatric/Behavioral: Negative for hallucinations.     Physical Exam Updated Vital Signs BP (!) 164/79   Pulse 77   Temp 97.9 F (36.6 C) (Oral)   Resp 18   Ht 5\' 4"  (1.626 m)   Wt 77.1 kg  SpO2 99%   BMI 29.18 kg/m   Physical Exam  Constitutional: She is oriented to person, place, and time. She appears well-developed.  HENT:  Head: Normocephalic.  Eyes: Conjunctivae and EOM are normal. No scleral icterus.  Neck: Neck supple. No thyromegaly present.  Cardiovascular: Normal rate and regular rhythm. Exam reveals no gallop and no friction rub.  No murmur heard. Pulmonary/Chest: No stridor. She has no wheezes. She has no rales. She exhibits no tenderness.  Abdominal: She exhibits no distension. There is no tenderness. There is no rebound.  Tender epigastric  Musculoskeletal: Normal range of motion. She exhibits no edema.  Lymphadenopathy:    She has no cervical adenopathy.  Neurological: She is oriented to person, place, and time. She exhibits normal muscle tone. Coordination normal.  Skin: No rash noted. No erythema.  Psychiatric: She has a normal mood and affect. Her behavior is normal.     ED Treatments / Results  Labs (all labs ordered are listed, but only abnormal results are displayed) Labs Reviewed  COMPREHENSIVE METABOLIC PANEL - Abnormal; Notable for the following components:      Result Value   Potassium 2.7 (*)    All other components within normal limits  CBC - Abnormal; Notable for the following components:   WBC 14.4 (*)    Hemoglobin 11.4 (*)    All other components within normal limits    LIPASE, BLOOD  URINALYSIS, ROUTINE W REFLEX MICROSCOPIC    EKG None  Radiology Ct Abdomen Pelvis W Contrast  Result Date: 06/19/2018 CLINICAL DATA:  Bilateral lower quadrant and umbilical abdominal pain. Diarrhea. Started this morning. Blood in stool for 1 day. EXAM: CT ABDOMEN AND PELVIS WITH CONTRAST TECHNIQUE: Multidetector CT imaging of the abdomen and pelvis was performed using the standard protocol following bolus administration of intravenous contrast. CONTRAST:  ISOVUE-300 IOPAMIDOL (ISOVUE-300) INJECTION 61% COMPARISON:  03/09/2015 FINDINGS: Lower chest: Lung bases are clear. Hepatobiliary: No focal liver abnormality is seen. Status post cholecystectomy. No biliary dilatation. Pancreas: Unremarkable. No pancreatic ductal dilatation or surrounding inflammatory changes. Spleen: Normal in size without focal abnormality. Adrenals/Urinary Tract: No adrenal gland nodules. Subcentimeter cyst in the right kidney. Nephrograms are otherwise homogeneous and symmetrical. No hydronephrosis or hydroureter. Bladder is unremarkable. Stomach/Bowel: Stomach, small bowel, and colon are not abnormally distended. No wall thickening is identified. Appendix is not visualized. Diverticulosis of the sigmoid colon. No focal inflammatory changes to suggest diverticulitis. Vascular/Lymphatic: Aortic atherosclerosis. No enlarged abdominal or pelvic lymph nodes. Reproductive: Uterus and bilateral adnexa are unremarkable. Other: No free air or free fluid in the abdomen. Minimal periumbilical hernia containing fat. Musculoskeletal: Degenerative changes in the spine. No destructive bone lesions. IMPRESSION: No acute process demonstrated in the abdomen or pelvis. No evidence of bowel obstruction or inflammation. Diverticulosis of the sigmoid colon without evidence of diverticulitis. Electronically Signed   By: Burman Nieves M.D.   On: 06/19/2018 22:26    Procedures Procedures (including critical care  time)  Medications Ordered in ED Medications  potassium chloride 10 mEq in 100 mL IVPB (10 mEq Intravenous New Bag/Given 06/19/18 2252)  potassium chloride SA (K-DUR,KLOR-CON) CR tablet 40 mEq (has no administration in time range)  methylPREDNISolone sodium succinate (SOLU-MEDROL) 125 mg/2 mL injection 125 mg (has no administration in time range)  potassium chloride 10 mEq in 100 mL IVPB (0 mEq Intravenous Stopped 06/19/18 2247)  iopamidol (ISOVUE-300) 61 % injection 100 mL (100 mLs Intravenous Contrast Given 06/19/18 2123)     Initial Impression /  Assessment and Plan / ED Course  I have reviewed the triage vital signs and the nursing notes.  Pertinent labs & imaging results that were available during my care of the patient were reviewed by me and considered in my medical decision making (see chart for details).    CT scan unremarkable.  Patient will be treated for gastritis with Protonix and also given Zofran and Bentyl.  She is hypokalemic and has been treated in the emergency department and will continue with potassium at home.  She will follow-up with the GI doctor and her family doctor Final Clinical Impressions(s) / ED Diagnoses   Final diagnoses:  Other acute gastritis without hemorrhage    ED Discharge Orders         Ordered    pantoprazole (PROTONIX) 20 MG tablet  Daily     06/19/18 2305    ondansetron (ZOFRAN ODT) 4 MG disintegrating tablet     06/19/18 2305    potassium chloride (K-DUR) 10 MEQ tablet  Daily     06/19/18 2305    dicyclomine (BENTYL) 20 MG tablet     06/19/18 2305           Bethann Berkshire, MD 06/19/18 2309

## 2018-06-19 NOTE — ED Triage Notes (Addendum)
Patient complaining of abdominal pain and diarrhea starting this morning. States "I've been having blood in my stool x 2 months."

## 2018-06-24 ENCOUNTER — Ambulatory Visit (INDEPENDENT_AMBULATORY_CARE_PROVIDER_SITE_OTHER): Payer: Medicare Other | Admitting: Internal Medicine

## 2018-06-29 ENCOUNTER — Encounter (INDEPENDENT_AMBULATORY_CARE_PROVIDER_SITE_OTHER): Payer: Self-pay | Admitting: *Deleted

## 2018-06-29 ENCOUNTER — Telehealth (INDEPENDENT_AMBULATORY_CARE_PROVIDER_SITE_OTHER): Payer: Self-pay | Admitting: *Deleted

## 2018-06-29 ENCOUNTER — Ambulatory Visit (INDEPENDENT_AMBULATORY_CARE_PROVIDER_SITE_OTHER): Payer: Medicare Other | Admitting: Internal Medicine

## 2018-06-29 ENCOUNTER — Encounter (INDEPENDENT_AMBULATORY_CARE_PROVIDER_SITE_OTHER): Payer: Self-pay | Admitting: Internal Medicine

## 2018-06-29 VITALS — BP 180/90 | HR 92 | Temp 98.1°F | Ht 64.0 in | Wt 171.7 lb

## 2018-06-29 DIAGNOSIS — R1013 Epigastric pain: Secondary | ICD-10-CM

## 2018-06-29 DIAGNOSIS — K625 Hemorrhage of anus and rectum: Secondary | ICD-10-CM

## 2018-06-29 MED ORDER — SUPREP BOWEL PREP KIT 17.5-3.13-1.6 GM/177ML PO SOLN: 1.0000 | Freq: Once | ORAL | 0 refills | Status: AC

## 2018-06-29 NOTE — Patient Instructions (Signed)
The risks of bleeding, perforation and infection were reviewed with patient.  

## 2018-06-29 NOTE — Telephone Encounter (Signed)
Patient needs suprep 

## 2018-06-29 NOTE — Progress Notes (Signed)
Subjective:    Patient ID: Betty Golden, female    DOB: 1946-01-26, 72 y.o.   MRN: [id number]  HPI Here today for f/u after recent visit to [facility] 06/19/2018  ED for acute gastritis.  Symptoms included vomiting and diarrhea. She also had epigastric discomfort. She was treated for gastritis. Given Protonix, Zofran and Bentyl in the ED.  Last seen in our office in October of this year. Her symptoms lasted 2-3 days. She did get better. She does have some epigastric pain off and on. Pain occurs randomly. The last episode of pain was yesterday. When she takes the Protonix the pain eases off. No nausea or vomiting, or fever. Appetite is good now. No weight. Her BMs are moving okay. Her stools are soft and then she may have diarrhea.  She also tells me she is still having some rectal bleeding. The blood is bright red.  At office visit in October she was guaiac negative.  She says her hemorrhoid comes and goes. Patient would like to proceed with a colonoscopy.    Last colonoscopy was in January of 2015 Prep excellent. Scattered diverticula throughout the colon but most of these were located at sigmoid colon. No polyps or other mucosal abnormalities noted. Normal rectal mucosa. Small hemorrhoids above and below the dentate line.  Review of Systems Past Medical History:  Diagnosis Date  . Arthritis   . Asthma   . C. difficile colitis   . Cervical stenosis of spine   . Chronic headaches   . Chronic neck pain   . Depression   . Diverticulosis   . Essential hypertension   . Fibromyalgia   . GERD (gastroesophageal reflux disease)   . History of Salmonella gastroenteritis   . HNP (herniated nucleus pulposus), lumbar   . Hyperlipidemia   . IBS (irritable bowel syndrome)   . Lung nodule     Past Surgical History:  Procedure Laterality Date  . APPENDECTOMY  1965  . BREAST BIOPSY    . BREAST REDUCTION SURGERY  1994  . CHOLECYSTECTOMY  1975  . COLONOSCOPY N/A 08/19/2013   Procedure:  COLONOSCOPY;  Surgeon: Malissa Hippo, MD;  Location: [facility];  Service: Endoscopy;  Laterality: N/A;  155-moved to 140 Ann to notify pt  . LUMBAR LAMINECTOMY/DECOMPRESSION MICRODISCECTOMY Bilateral 09/20/2015   Procedure: MICRO LUMBAR DECOMPRESSION L5-S1 BILATERAL    (1 LEVEL);  Surgeon: [name], MD;  Location: [facility];  Service: Orthopedics;  Laterality: Bilateral;  . Sinus Surgery    . TONSILLECTOMY  1968    Allergies  Allergen Reactions  . Sulfa Antibiotics Rash    Current Outpatient Medications on File Prior to Visit  Medication Sig Dispense Refill  . acetaminophen (TYLENOL) 500 MG tablet Take 1 tablet (500 mg total) by mouth every 6 (six) hours as needed for mild pain. Do not exceed 4 grams of acetaminophen daily in combination with Norco  0  . albuterol (PROAIR HFA) 108 (90 Base) MCG/ACT inhaler Inhale 2 puffs into the lungs every 4 (four) hours as needed for wheezing or shortness of breath. 1 Inhaler 1  . Azelastine-Fluticasone 137-50 MCG/ACT SUSP Place 2 sprays into the nose 2 (two) times daily. 1 Bottle 5  . dicyclomine (BENTYL) 20 MG tablet Take 1 every 8 hours as needed for abdominal cramping 20 tablet 0  . DULoxetine (CYMBALTA) 20 MG capsule Take 20 mg by mouth 2 (two) times daily.    . Fluticasone Furoate (ARNUITY ELLIPTA) 100 MCG/ACT AEPB Inhale  1 Dose into the lungs daily. 30 each 5  . folic acid (FOLVITE) 1 MG tablet Take 1 mg by mouth daily.    Marland Kitchen gabapentin (NEURONTIN) 100 MG capsule Take 100 mg by mouth 2 (two) times daily.     . hydrocortisone-pramoxine (PROCTOFOAM HC) rectal foam Place 1 applicator rectally 2 (two) times daily. 10 g 0  . losartan-hydrochlorothiazide (HYZAAR) 100-12.5 MG per tablet Take 1 tablet by mouth daily.    . methotrexate 2.5 MG tablet Take by mouth once a week. TAKES 6 PILLS ONCE A WEEK    . ondansetron (ZOFRAN ODT) 4 MG disintegrating tablet 4mg  ODT q4 hours prn nausea/vomit 12 tablet 0  . pantoprazole (PROTONIX) 20 MG tablet Take 1  tablet (20 mg total) by mouth daily. 30 tablet 0  . potassium chloride (K-DUR) 10 MEQ tablet Take 1 tablet (10 mEq total) by mouth daily. 7 tablet 0  . simvastatin (ZOCOR) 20 MG tablet Take 20 mg by mouth at bedtime.      No current facility-administered medications on file prior to visit.         Objective:   Physical Exam Blood pressure (!) 180/90, pulse 92, temperature 98.1 F (36.7 C), height 5\' 4"  (1.626 m), weight 171 lb 11.2 oz (77.9 kg). Alert and oriented. Skin warm and dry. Oral mucosa is moist.   . Sclera anicteric, conjunctivae is pink. Thyroid not enlarged. No cervical lymphadenopathy. Lungs clear. Heart regular rate and rhythm.  Abdomen is soft. Bowel sounds are positive. No hepatomegaly. No abdominal masses felt. No tenderness.  No edema to lower extremities.           Assessment & Plan:  Rectal bleeding. Colonic neoplasms needs to be ruled out. Hemorrhoids,, polyp also in the differential.  Epigastric Pain/GERD. She will continue the Protonix. Will get a abdomen.

## 2018-07-06 ENCOUNTER — Encounter (HOSPITAL_COMMUNITY): Payer: Self-pay | Admitting: Emergency Medicine

## 2018-07-06 ENCOUNTER — Emergency Department (HOSPITAL_COMMUNITY): Payer: Medicare Other

## 2018-07-06 ENCOUNTER — Ambulatory Visit (HOSPITAL_COMMUNITY): Payer: Medicare Other

## 2018-07-06 ENCOUNTER — Emergency Department (HOSPITAL_COMMUNITY)
Admission: EM | Admit: 2018-07-06 | Discharge: 2018-07-06 | Disposition: A | Discharge status: Home / Self Care | Payer: Medicare Other | Attending: Emergency Medicine | Admitting: Emergency Medicine

## 2018-07-06 DIAGNOSIS — R1011 Right upper quadrant pain: Secondary | ICD-10-CM

## 2018-07-06 DIAGNOSIS — J449 Chronic obstructive pulmonary disease, unspecified: Secondary | ICD-10-CM | POA: Diagnosis not present

## 2018-07-06 DIAGNOSIS — I10 Essential (primary) hypertension: Secondary | ICD-10-CM | POA: Insufficient documentation

## 2018-07-06 DIAGNOSIS — J45909 Unspecified asthma, uncomplicated: Secondary | ICD-10-CM | POA: Insufficient documentation

## 2018-07-06 DIAGNOSIS — Z79899 Other long term (current) drug therapy: Secondary | ICD-10-CM | POA: Insufficient documentation

## 2018-07-06 DIAGNOSIS — K2900 Acute gastritis without bleeding: Secondary | ICD-10-CM | POA: Diagnosis not present

## 2018-07-06 DIAGNOSIS — Z87891 Personal history of nicotine dependence: Secondary | ICD-10-CM | POA: Insufficient documentation

## 2018-07-06 LAB — CBC WITH DIFFERENTIAL/PLATELET
Abs Immature Granulocytes: 0.08 10*3/uL — ABNORMAL HIGH (ref 0.00–0.07)
Basophils Absolute: 0 10*3/uL (ref 0.0–0.1)
Basophils Relative: 0 %
Eosinophils Absolute: 0 10*3/uL (ref 0.0–0.5)
Eosinophils Relative: 0 %
HCT: 34.1 % — ABNORMAL LOW (ref 36.0–46.0)
HEMOGLOBIN: 10.9 g/dL — AB (ref 12.0–15.0)
Immature Granulocytes: 1 %
LYMPHS ABS: 3.7 10*3/uL (ref 0.7–4.0)
Lymphocytes Relative: 22 %
MCH: 29.1 pg (ref 26.0–34.0)
MCHC: 32 g/dL (ref 30.0–36.0)
MCV: 91.2 fL (ref 80.0–100.0)
Monocytes Absolute: 1 10*3/uL (ref 0.1–1.0)
Monocytes Relative: 6 %
NEUTROS PCT: 71 %
Neutro Abs: 12.1 10*3/uL — ABNORMAL HIGH (ref 1.7–7.7)
Platelets: 337 10*3/uL (ref 150–400)
RBC: 3.74 MIL/uL — ABNORMAL LOW (ref 3.87–5.11)
RDW: 15.4 % (ref 11.5–15.5)
WBC: 16.9 10*3/uL — ABNORMAL HIGH (ref 4.0–10.5)
nRBC: 0 % (ref 0.0–0.2)

## 2018-07-06 LAB — COMPREHENSIVE METABOLIC PANEL
ALBUMIN: 4.1 g/dL (ref 3.5–5.0)
ALT: 24 U/L (ref 0–44)
AST: 18 U/L (ref 15–41)
Alkaline Phosphatase: 62 U/L (ref 38–126)
Anion gap: 10 (ref 5–15)
BUN: 12 mg/dL (ref 8–23)
CO2: 27 mmol/L (ref 22–32)
Calcium: 9.3 mg/dL (ref 8.9–10.3)
Chloride: 103 mmol/L (ref 98–111)
Creatinine, Ser: 0.86 mg/dL (ref 0.44–1.00)
GFR calc Af Amer: >60 mL/min (ref >60)
GFR calc non Af Amer: >60 mL/min (ref >60)
GLUCOSE: 103 mg/dL — AB (ref 70–99)
Potassium: 3 mmol/L — ABNORMAL LOW (ref 3.5–5.1)
Sodium: 140 mmol/L (ref 135–145)
Total Bilirubin: 0.9 mg/dL (ref 0.3–1.2)
Total Protein: 7.8 g/dL (ref 6.5–8.1)

## 2018-07-06 LAB — LIPASE, BLOOD: Lipase: 25 U/L (ref 11–51)

## 2018-07-06 LAB — LACTIC ACID, PLASMA: Lactic Acid, Venous: 1.2 mmol/L (ref 0.5–1.9)

## 2018-07-06 MED ORDER — PANTOPRAZOLE SODIUM 20 MG PO TBEC: 20.0000 mg | DELAYED_RELEASE_TABLET | Freq: Two times a day (BID) | ORAL | 1 refills | Status: DC

## 2018-07-06 MED ORDER — POTASSIUM CHLORIDE CRYS ER 20 MEQ PO TBCR
40.0000 meq | EXTENDED_RELEASE_TABLET | Freq: Once | ORAL | Status: AC
  Administered 2018-07-06: 40 meq via ORAL
  Filled 2018-07-06: qty 2

## 2018-07-06 MED ORDER — HYDROCODONE-ACETAMINOPHEN 5-325 MG PO TABS: 1.0000 | ORAL_TABLET | ORAL | 0 refills | Status: DC | PRN

## 2018-07-06 MED ORDER — METOCLOPRAMIDE HCL 5 MG/ML IJ SOLN
10.0000 mg | Freq: Once | INTRAMUSCULAR | Status: AC
  Administered 2018-07-06: 10 mg via INTRAVENOUS
  Filled 2018-07-06: qty 2

## 2018-07-06 MED ORDER — HYDROMORPHONE HCL 1 MG/ML IJ SOLN
0.5000 mg | Freq: Once | INTRAMUSCULAR | Status: AC
  Administered 2018-07-06: 0.5 mg via INTRAVENOUS
  Filled 2018-07-06: qty 1

## 2018-07-06 MED ORDER — PANTOPRAZOLE SODIUM 40 MG IV SOLR
40.0000 mg | Freq: Once | INTRAVENOUS | Status: AC
  Administered 2018-07-06: 40 mg via INTRAVENOUS
  Filled 2018-07-06: qty 40

## 2018-07-06 MED ORDER — ONDANSETRON HCL 4 MG/2ML IJ SOLN
4.0000 mg | Freq: Once | INTRAMUSCULAR | Status: AC
  Administered 2018-07-06: 4 mg via INTRAVENOUS
  Filled 2018-07-06: qty 2

## 2018-07-06 MED ORDER — HYDROCODONE-ACETAMINOPHEN 5-325 MG PO TABS
1.0000 | ORAL_TABLET | Freq: Once | ORAL | Status: AC
  Administered 2018-07-06: 1 via ORAL
  Filled 2018-07-06: qty 1

## 2018-07-06 MED ORDER — FAMOTIDINE IN NACL 20-0.9 MG/50ML-% IV SOLN
20.0000 mg | Freq: Once | INTRAVENOUS | Status: AC
  Administered 2018-07-06: 20 mg via INTRAVENOUS
  Filled 2018-07-06: qty 50

## 2018-07-06 MED ORDER — SUCRALFATE 1 G PO TABS: 1.0000 g | ORAL_TABLET | Freq: Three times a day (TID) | ORAL | 1 refills | Status: DC

## 2018-07-06 NOTE — Discharge Instructions (Addendum)
Your vital signs have been within normal limits during your emergency department visit.  Your chest x-ray is within normal limits.  And your ultrasound of the right upper abdomen is also negative for acute problem.  I have reviewed your CT scan from November 22, and no acute problems were noted there.  There is no evidence of bleeding on your rectal examination.  Your examination favors gastritis, or irritation of your stomach, and/or colon.  Please use Carafate 4 times daily (breakfast, lunch, dinner, bedtime).  Please increase your Protonix to 2 times daily.  Use Tylenol extra strength for mild pain, use Norco for more severe pain.  Norco may cause drowsiness.  Please do not drive a vehicle, operate machinery, or participate in activities requiring concentration when taking this medication.  Please use caution when walking, or changing positions when taking this medication.  Please do not use any Goody powders, BC powders, aspirin products, with exception of an 81 mg aspirin if directed by your physician.  Please do not use ibuprofen, Advil, Motrin, Aleve, or similar products.  Please see the GI specialist as directed.  Return to the emergency department if any changes in your condition, problems, or concerns.

## 2018-07-06 NOTE — ED Provider Notes (Signed)
[facility] EMERGENCY DEPARTMENT Provider Note   CSN: [id number] Arrival date & time: 07/06/18  7858     History   Chief Complaint Chief Complaint  Patient presents with  . Abdominal Pain    HPI Betty Golden is a 72 y.o. female.  Patient is a 72 year old female who presents to the emergency department with a complaint of upper abdomen pain.  The patient states that she has had abdominal pain for several weeks.  She was here in the emergency department approximately 2 weeks ago at which time she had a negative CT scan.  She is scheduled today to have an ultrasound of her abdomen.  But she says the pain was so severe on last night and this morning that she cannot take it.  She is not had any injury or trauma to the abdomen.  She says however that she has been using [place] powders almost every day over the last 2 years.  On some days she is used as many as 3 in 1 day.  She says that in the past she has seen some blood in her stool, she is talked with her primary physician about this and was told that she probably had internal and/or external hemorrhoids.  The patient states the pain is keeping her up at night on night last night and she is having pain again this morning.  There is nothing that really changes the pain to make it any better, or any worse.  She has not had vomiting, she has not had diarrhea.  She presents now for assistance with this issue.  The history is provided by the patient.  Abdominal Pain   Pertinent negatives include dysuria, frequency, hematuria and arthralgias.    Past Medical History:  Diagnosis Date  . Arthritis   . Asthma   . C. difficile colitis   . Cervical stenosis of spine   . Chronic headaches   . Chronic neck pain   . Depression   . Diverticulosis   . Essential hypertension   . Fibromyalgia   . GERD (gastroesophageal reflux disease)   . History of Salmonella gastroenteritis   . HNP (herniated nucleus pulposus), lumbar   . Hyperlipidemia     . IBS (irritable bowel syndrome)   . Lung nodule     Patient Active Problem List   Diagnosis Date Noted  . Rectal bleeding 06/29/2018  . Fibromyalgia 09/08/2017  . Spinal stenosis of lumbar region 09/20/2015  . 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 Moderate persistent asthma 07/03/2015  . Allergic rhinitis 07/03/2015  . Colitis due to Clostridium difficile 02/10/2015  . Abdominal pain 02/10/2015  . Nausea and vomiting 02/10/2015  . Acute renal failure (HCC) 02/10/2015  . Dehydration 02/10/2015  . Hypoxia 02/10/2015  . Hyponatremia 02/10/2015  . Hypokalemia 02/10/2015  . COPD (chronic obstructive pulmonary disease) (HCC)   . Unspecified constipation 07/15/2013  . DYSPNEA 04/05/2008  . Essential hypertension 03/10/2008  . Headache(784.0) 03/10/2008  . Cough 03/10/2008    Past Surgical History:  Procedure Laterality Date  . APPENDECTOMY  1965  . BREAST BIOPSY    . BREAST REDUCTION SURGERY  1994  . CHOLECYSTECTOMY  1975  . COLONOSCOPY N/A 08/19/2013   Procedure: COLONOSCOPY;  Surgeon: Malissa Hippo, MD;  Location: [facility];  Service: Endoscopy;  Laterality: N/A;  155-moved to 140 Ann to notify pt  . LUMBAR LAMINECTOMY/DECOMPRESSION MICRODISCECTOMY Bilateral 09/20/2015   Procedure: MICRO LUMBAR DECOMPRESSION L5-S1 BILATERAL    (1 LEVEL);  Surgeon:  [name], MD;  Location: [facility]S;  Service: Orthopedics;  Laterality: Bilateral;  . Sinus Surgery    . TONSILLECTOMY  1968     OB History   None      Home Medications    Prior to Admission medications   Medication Sig Start Date End Date Taking? Authorizing Provider  acetaminophen (TYLENOL) 500 MG tablet Take 1 tablet (500 mg total) by mouth every 6 (six) hours as needed for mild pain. Do not exceed 4 grams of acetaminophen daily in combination with Norco 09/21/15   Dorothy Spark, PA-C  albuterol (PROAIR HFA) 108 (90 Base) MCG/ACT inhaler Inhale 2 puffs into the lungs every 4 (four) hours as needed for wheezing or  shortness of breath. 03/31/18   Alfonse Spruce, MD  Azelastine-Fluticasone [phone] MCG/ACT SUSP Place 2 sprays into the nose 2 (two) times daily. 04/02/18   Alfonse Spruce, MD  dicyclomine (BENTYL) 20 MG tablet Take 1 every 8 hours as needed for abdominal cramping 06/19/18   Bethann Berkshire, MD  DULoxetine (CYMBALTA) 20 MG capsule Take 20 mg by mouth 2 (two) times daily.    [provider]  Fluticasone Furoate (ARNUITY ELLIPTA) 100 MCG/ACT AEPB Inhale 1 Dose into the lungs daily. 04/21/18   Alfonse Spruce, MD  folic acid (FOLVITE) 1 MG tablet Take 1 mg by mouth daily.    [provider]  gabapentin (NEURONTIN) 100 MG capsule Take 100 mg by mouth 2 (two) times daily.       hydrocortisone-pramoxine (PROCTOFOAM HC) rectal foam Place 1 applicator rectally 2 (two) times daily. 05/05/18   Setzer, Brand Males, NP  losartan-hydrochlorothiazide (HYZAAR) 100-12.5 MG per tablet Take 1 tablet by mouth daily.    [provider]  methotrexate 2.5 MG tablet Take by mouth once a week. TAKES 6 PILLS ONCE A WEEK      ondansetron (ZOFRAN ODT) 4 MG disintegrating tablet 4mg  ODT q4 hours prn nausea/vomit 06/19/18   Bethann Berkshire, MD  pantoprazole (PROTONIX) 20 MG tablet Take 1 tablet (20 mg total) by mouth daily. 06/19/18   Bethann Berkshire, MD  potassium chloride (K-DUR) 10 MEQ tablet Take 1 tablet (10 mEq total) by mouth daily. 06/19/18   Bethann Berkshire, MD  simvastatin (ZOCOR) 20 MG tablet Take 20 mg by mouth at bedtime.     [provider]    Family History Family History  Problem Relation Age of Onset  . Heart disease Mother   . Hypertension Mother   . Asthma Mother   . Heart disease Father   . Stroke Father   . Hypertension Father   . Stroke Sister   . Asthma Sister   . Hypertension Brother   . Asthma Brother   . Asthma Maternal Grandmother   . Heart disease Maternal Grandmother   . Diabetes Maternal Grandfather   . Colon cancer Neg Hx     Social  History Social History   Tobacco Use  . Smoking status: Former Smoker    Packs/day: 2.00    Years: 10.00    Pack years: 20.00    Types: Cigarettes    Last attempt to quit: 09/11/1988    Years since quitting: 29.8  . Smokeless tobacco: Never Used  . Tobacco comment: quit 1990  Substance Use Topics  . Alcohol use: No    Comment: stopped in 1990  . Drug use: No    Comment: hx smoking marijuana and cocaine weekends. Stopped in the 1990s.  Allergies   Sulfa antibiotics   Review of Systems Review of Systems  Constitutional: Negative for activity change.       All ROS Neg except as noted in HPI  HENT: Negative for nosebleeds.   Eyes: Negative for photophobia and discharge.  Respiratory: Negative for cough, shortness of breath and wheezing.   Cardiovascular: Negative for chest pain and palpitations.  Gastrointestinal: Positive for abdominal pain. Negative for blood in stool.  Genitourinary: Negative for dysuria, frequency and hematuria.  Musculoskeletal: Negative for arthralgias, back pain and neck pain.  Skin: Negative.   Neurological: Negative for dizziness, seizures and speech difficulty.  Psychiatric/Behavioral: Negative for confusion and hallucinations.     Physical Exam Updated Vital Signs BP 131/79   Pulse 93   Temp (!) 97.5 F (36.4 C) (Oral)   Resp 18   Ht 5\' 4"  (1.626 m)   Wt 77.8 kg   SpO2 100%   BMI 29.44 kg/m   Physical Exam  Constitutional: She is oriented to person, place, and time. She appears well-developed and well-nourished.  Non-toxic appearance.  HENT:  Head: Normocephalic.  Right Ear: Tympanic membrane and external ear normal.  Left Ear: Tympanic membrane and external ear normal.  Eyes: Pupils are equal, round, and reactive to light. EOM and lids are normal.  Neck: Normal range of motion. Neck supple. Carotid bruit is not present.  Cardiovascular: Normal rate, regular rhythm, intact distal pulses and normal pulses.  Murmur heard.   Systolic murmur is present with a grade of 2/6. Pulmonary/Chest: Breath sounds normal. No respiratory distress.  Abdominal: Soft. Bowel sounds are normal. There is generalized tenderness. There is no guarding.  Diffuse tenderness.  Pain is worse in the right upper quadrant and the epigastric area.  Bowel sounds are present and active.  Patient will not allow complete examination, but no palpable mass appreciated.  Musculoskeletal: Normal range of motion.  Lymphadenopathy:       Head (right side): No submandibular adenopathy present.       Head (left side): No submandibular adenopathy present.    She has no cervical adenopathy.  Neurological: She is alert and oriented to person, place, and time. She has normal strength. No cranial nerve deficit or sensory deficit.  Skin: Skin is warm and dry.  Psychiatric: She has a normal mood and affect. Her speech is normal.  Nursing note and vitals reviewed.    ED Treatments / Results  Labs (all labs ordered are listed, but only abnormal results are displayed) Labs Reviewed  COMPREHENSIVE METABOLIC PANEL  LIPASE, BLOOD  CBC WITH DIFFERENTIAL/PLATELET  URINALYSIS, ROUTINE W REFLEX MICROSCOPIC  POC OCCULT BLOOD, ED    EKG None  Radiology No results found.  Procedures Procedures (including critical care time)  Medications Ordered in ED Medications  HYDROmorphone (DILAUDID) injection 0.5 mg (has no administration in time range)  ondansetron (ZOFRAN) injection 4 mg (has no administration in time range)     Initial Impression / Assessment and Plan / ED Course  I have reviewed the triage vital signs and the nursing notes.  Pertinent labs & imaging results that were available during my care of the patient were reviewed by me and considered in my medical decision making (see chart for details).       Final Clinical Impressions(s) / ED Diagnoses MDM  Vital signs reviewed.  Pulse oximetry is been within normal limits.  Patient having  diffuse abdominal area pain, but worse in the right upper and epigastric area.  She  has had nausea and some diarrhea present.  CT scan on 1122 was negative for acute problem.  Patient has been on Protonix once daily, but she says that she continues to have issues.  It is of note that the patient gives history of using [place] powders at least once daily for the past 2 years.  Electrocardiogram is negative for acute cardiac event.  The comprehensive metabolic panel shows the potassium to be low at 3.0, otherwise within normal limits.  The anion gap is 10.  The lipase is normal at 25. White blood cell count is elevated at 16,900, the hemoglobin is slightly low at 10.9, the hematocrit is also low at 34.1.  Chest x-ray is negative for any free air under the diaphragm.  Chest x-ray is also negative for any cardiopulmonary issue. Ultrasound of the right upper quadrant shows the gallbladder to be absent, the common bile ducts are in the upper limits of normal postcholecystectomy.  There are no bile duct lesions, no liver lesions appreciated.  Stool is negative for occult blood at this time. Case discussed with Dr. Estell Harpin.  Examination favors gastritis.  Patient was treated in the emergency department with pain medicine and antiemetic medication, as well as Pepcid and Protonix.  Prescription for Carafate given to the patient.  Patient will also increase Protonix to twice daily.  Prescription for Norco given to the patient for pain if needed.  Patient is already scheduled for a GI evaluation.  The patient and family invited to return to the emergency department immediately if any changes in condition, problems, or concerns.   Final diagnoses:  RUQ abdominal pain  Acute gastritis without hemorrhage, unspecified gastritis type    ED Discharge Orders         Ordered    pantoprazole (PROTONIX) 20 MG tablet  2 times daily     07/06/18 1430    sucralfate (CARAFATE) 1 g tablet  3 times daily with meals &  bedtime     07/06/18 1430    HYDROcodone-acetaminophen (NORCO/VICODIN) 5-325 MG tablet  Every 4 hours PRN     07/06/18 1430           Ivery Quale, PA-C 07/06/18 1442    Bethann Berkshire, MD 07/06/18 1524

## 2018-07-06 NOTE — ED Triage Notes (Signed)
Pt report upper abd pain for several weeks now, was seen here 2 weeks ago and is scheduled for abd ultrasound this morning but the pain is too bad to make it.  Constant nausea with some diarrhea.

## 2018-07-07 ENCOUNTER — Telehealth (INDEPENDENT_AMBULATORY_CARE_PROVIDER_SITE_OTHER): Payer: Self-pay | Admitting: Internal Medicine

## 2018-07-07 ENCOUNTER — Telehealth (INDEPENDENT_AMBULATORY_CARE_PROVIDER_SITE_OTHER): Payer: Self-pay | Admitting: *Deleted

## 2018-07-07 NOTE — Telephone Encounter (Signed)
Patients daughter came by office - stated she was with patient at the hospital all day Monday and patient is still not feeling well today.  Daughter needs a note for work stating mothers condition for missing work.  Needs your approval.  Please call patient.

## 2018-07-07 NOTE — Telephone Encounter (Signed)
Please call patient [phone]) you order [place] complete, she went to ER over the weekend and had [place] RUQ

## 2018-07-08 ENCOUNTER — Encounter (INDEPENDENT_AMBULATORY_CARE_PROVIDER_SITE_OTHER): Payer: Self-pay | Admitting: Internal Medicine

## 2018-07-08 ENCOUNTER — Ambulatory Visit (INDEPENDENT_AMBULATORY_CARE_PROVIDER_SITE_OTHER): Payer: Medicare Other | Admitting: Internal Medicine

## 2018-07-08 VITALS — BP 138/68 | HR 72 | Temp 98.0°F | Ht 64.0 in | Wt 167.4 lb

## 2018-07-08 DIAGNOSIS — K2990 Gastroduodenitis, unspecified, without bleeding: Secondary | ICD-10-CM | POA: Diagnosis not present

## 2018-07-08 DIAGNOSIS — R1013 Epigastric pain: Secondary | ICD-10-CM | POA: Diagnosis not present

## 2018-07-08 DIAGNOSIS — K2970 Gastritis, unspecified, without bleeding: Secondary | ICD-10-CM | POA: Insufficient documentation

## 2018-07-08 DIAGNOSIS — K625 Hemorrhage of anus and rectum: Secondary | ICD-10-CM | POA: Diagnosis not present

## 2018-07-08 NOTE — Telephone Encounter (Signed)
Patient was seen in the ED this morning.  EGD has been scheduled

## 2018-07-08 NOTE — Patient Instructions (Signed)
The risks of bleeding, perforation and infection were reviewed with patient.  

## 2018-07-08 NOTE — Telephone Encounter (Signed)
I saw patient I the office today and have added an EGD to her colonoscopy

## 2018-07-08 NOTE — Progress Notes (Signed)
Subjective:    Patient ID: Betty Golden, female    DOB: 1946-06-26, 72 y.o.   MRN: [id number]  HPI Here today for f/u after recent visit to the ED (07/06/2018) for gastritis. Per records from the ED she has been using BC Powders daily since 2011. Sometimes she will take 1-3 pills a day. She was taking the [facility] Powders for headache or leg pain. She had not reported this to me on previous visits nor her daughter.  She has stopped taking the BC's at this point. Pain located in the ruq and epigastric area. Stool was negative for blood in the ED. Her Protonix was increased to twice a day and she was started on Carafate four times a day. She started the Carafate yesterday she cannot tell me if it helps.  CT scan abdomen/pelvis w contrast 06/19/2018 revealed No acute process demonstrated in the abdomen or pelvis. No evidence of bowel obstruction or inflammation. Diverticulosis of the sigmoid colon without evidence of diverticulitis. 07/06/2018 [place] RUQ: Gallbladder absent. Common bile duct upper normal for post cholecystectomy state. No biliary duct lesion evident by ultrasound. No liver lesions appreciable. Daughter has been out of work since Monday trying to help her mother.  She tells me she feels better since Monday.  Her BMs are brown and loose.    Last colonoscopy was in January of 2015 Prep excellent. Scattered diverticula throughout the colon but most of these were located at sigmoid colon. No polyps or other mucosal abnormalities noted. Normal rectal mucosa. Small hemorrhoids above and below the dentate line.  Review of Systems Past Medical History:  Diagnosis Date  . Arthritis   . Asthma   . C. difficile colitis   . Cervical stenosis of spine   . Chronic headaches   . Chronic neck pain   . Depression   . Diverticulosis   . Essential hypertension   . Fibromyalgia   . GERD (gastroesophageal reflux disease)   . History of Salmonella gastroenteritis   . HNP (herniated nucleus  pulposus), lumbar   . Hyperlipidemia   . IBS (irritable bowel syndrome)   . Lung nodule     Past Surgical History:  Procedure Laterality Date  . APPENDECTOMY  1965  . BREAST BIOPSY    . BREAST REDUCTION SURGERY  1994  . CHOLECYSTECTOMY  1975  . COLONOSCOPY N/A 08/19/2013   Procedure: COLONOSCOPY;  Surgeon: Malissa Hippo, MD;  Location: [facility];  Service: Endoscopy;  Laterality: N/A;  155-moved to 140 Ann to notify pt  . LUMBAR LAMINECTOMY/DECOMPRESSION MICRODISCECTOMY Bilateral 09/20/2015   Procedure: MICRO LUMBAR DECOMPRESSION L5-S1 BILATERAL    (1 LEVEL);  Surgeon: [name], MD;  Location: [facility];  Service: Orthopedics;  Laterality: Bilateral;  . Sinus Surgery    . TONSILLECTOMY  1968    Allergies  Allergen Reactions  . Sulfa Antibiotics Rash    Current Outpatient Medications on File Prior to Visit  Medication Sig Dispense Refill  . acetaminophen (TYLENOL) 500 MG tablet Take 1 tablet (500 mg total) by mouth every 6 (six) hours as needed for mild pain. Do not exceed 4 grams of acetaminophen daily in combination with Norco  0  . albuterol (PROAIR HFA) 108 (90 Base) MCG/ACT inhaler Inhale 2 puffs into the lungs every 4 (four) hours as needed for wheezing or shortness of breath. 1 Inhaler 1  . Azelastine-Fluticasone 137-50 MCG/ACT SUSP Place 2 sprays into the nose 2 (two) times daily. 1 Bottle 5  . dicyclomine (  BENTYL) 20 MG tablet Take 1 every 8 hours as needed for abdominal cramping (Patient taking differently: Take 20 mg by mouth every 8 (eight) hours as needed for spasms. ) 20 tablet 0  . DULoxetine (CYMBALTA) 20 MG capsule Take 20 mg by mouth 2 (two) times daily.    . Fluticasone Furoate (ARNUITY ELLIPTA) 100 MCG/ACT AEPB Inhale 1 Dose into the lungs daily. 30 each 5  . folic acid (FOLVITE) 1 MG tablet Take 1 mg by mouth daily.    Marland Kitchen gabapentin (NEURONTIN) 300 MG capsule Take 300 mg by mouth 3 (three) times daily.  3  . HYDROcodone-acetaminophen (NORCO/VICODIN) 5-325  MG tablet Take 1-2 tablets by mouth every 4 (four) hours as needed. 15 tablet 0  . losartan-hydrochlorothiazide (HYZAAR) 100-12.5 MG per tablet Take 1 tablet by mouth daily.    . methotrexate 2.5 MG tablet Take by mouth once a week. TAKES 6 PILLS ONCE A WEEK    . ondansetron (ZOFRAN ODT) 4 MG disintegrating tablet 4mg  ODT q4 hours prn nausea/vomit 12 tablet 0  . pantoprazole (PROTONIX) 20 MG tablet Take 1 tablet (20 mg total) by mouth 2 (two) times daily. 30 tablet 1  . potassium chloride (K-DUR) 10 MEQ tablet Take 1 tablet (10 mEq total) by mouth daily. 7 tablet 0  . simvastatin (ZOCOR) 20 MG tablet Take 20 mg by mouth at bedtime.     . sucralfate (CARAFATE) 1 g tablet Take 1 tablet (1 g total) by mouth 4 (four) times daily -  with meals and at bedtime. 45 tablet 1   No current facility-administered medications on file prior to visit.         Objective:   Physical Exam Blood pressure 138/68, pulse 72, temperature 98 F (36.7 C), height 5\' 4"  (1.626 m), weight 167 lb 6.4 oz (75.9 kg). Alert and oriented. Skin warm and dry. Oral mucosa is moist.   . Sclera anicteric, conjunctivae is pink. Thyroid not enlarged. No cervical lymphadenopathy. Lungs clear. Heart regular rate and rhythm.  Abdomen is soft. Bowel sounds are positive. No hepatomegaly. No abdominal masses felt. No tenderness.  No edema to lower extremities.         Assessment & Plan:  Epigastric pain. Has been taking an excessive amt of BC powders. Patient will need an EGD to rule out gastritis.

## 2018-08-12 ENCOUNTER — Ambulatory Visit: Payer: Medicare Other

## 2018-08-19 ENCOUNTER — Ambulatory Visit (INDEPENDENT_AMBULATORY_CARE_PROVIDER_SITE_OTHER): Payer: Medicare Other | Admitting: *Deleted

## 2018-08-19 DIAGNOSIS — J4540 Moderate persistent asthma, uncomplicated: Secondary | ICD-10-CM | POA: Diagnosis not present

## 2018-08-19 MED ORDER — BENRALIZUMAB 30 MG/ML ~~LOC~~ SOSY
30.0000 mg | PREFILLED_SYRINGE | SUBCUTANEOUS | Status: DC
  Administered 2018-08-19 – 2021-07-25 (×18): 30 mg via SUBCUTANEOUS

## 2018-08-26 ENCOUNTER — Other Ambulatory Visit: Payer: Self-pay

## 2018-08-26 ENCOUNTER — Encounter (HOSPITAL_COMMUNITY)
Admission: RE | Disposition: A | Discharge status: Home / Self Care | Payer: Self-pay | Source: Home / Self Care | Attending: Internal Medicine

## 2018-08-26 ENCOUNTER — Encounter (HOSPITAL_COMMUNITY): Payer: Self-pay

## 2018-08-26 ENCOUNTER — Ambulatory Visit (HOSPITAL_COMMUNITY): Admit: 2018-08-26 | Payer: Medicare Other | Admitting: Internal Medicine

## 2018-08-26 ENCOUNTER — Ambulatory Visit (HOSPITAL_COMMUNITY)
Admission: RE | Admit: 2018-08-26 | Discharge: 2018-08-26 | Disposition: A | Discharge status: Home / Self Care | Payer: Medicare Other | Attending: Internal Medicine | Admitting: Internal Medicine

## 2018-08-26 ENCOUNTER — Encounter (HOSPITAL_COMMUNITY): Payer: Self-pay | Admitting: *Deleted

## 2018-08-26 DIAGNOSIS — K5730 Diverticulosis of large intestine without perforation or abscess without bleeding: Secondary | ICD-10-CM | POA: Diagnosis not present

## 2018-08-26 DIAGNOSIS — Z9049 Acquired absence of other specified parts of digestive tract: Secondary | ICD-10-CM | POA: Diagnosis not present

## 2018-08-26 DIAGNOSIS — Z8711 Personal history of peptic ulcer disease: Secondary | ICD-10-CM | POA: Insufficient documentation

## 2018-08-26 DIAGNOSIS — K2990 Gastroduodenitis, unspecified, without bleeding: Secondary | ICD-10-CM

## 2018-08-26 DIAGNOSIS — K625 Hemorrhage of anus and rectum: Secondary | ICD-10-CM | POA: Diagnosis not present

## 2018-08-26 DIAGNOSIS — J45909 Unspecified asthma, uncomplicated: Secondary | ICD-10-CM | POA: Insufficient documentation

## 2018-08-26 DIAGNOSIS — Z87891 Personal history of nicotine dependence: Secondary | ICD-10-CM | POA: Insufficient documentation

## 2018-08-26 DIAGNOSIS — K228 Other specified diseases of esophagus: Secondary | ICD-10-CM | POA: Diagnosis not present

## 2018-08-26 DIAGNOSIS — M069 Rheumatoid arthritis, unspecified: Secondary | ICD-10-CM | POA: Diagnosis not present

## 2018-08-26 DIAGNOSIS — R1013 Epigastric pain: Secondary | ICD-10-CM | POA: Diagnosis not present

## 2018-08-26 DIAGNOSIS — E785 Hyperlipidemia, unspecified: Secondary | ICD-10-CM | POA: Diagnosis not present

## 2018-08-26 DIAGNOSIS — K2970 Gastritis, unspecified, without bleeding: Secondary | ICD-10-CM | POA: Insufficient documentation

## 2018-08-26 DIAGNOSIS — I10 Essential (primary) hypertension: Secondary | ICD-10-CM | POA: Insufficient documentation

## 2018-08-26 DIAGNOSIS — K644 Residual hemorrhoidal skin tags: Secondary | ICD-10-CM | POA: Insufficient documentation

## 2018-08-26 DIAGNOSIS — Z79899 Other long term (current) drug therapy: Secondary | ICD-10-CM | POA: Insufficient documentation

## 2018-08-26 DIAGNOSIS — F329 Major depressive disorder, single episode, unspecified: Secondary | ICD-10-CM | POA: Insufficient documentation

## 2018-08-26 DIAGNOSIS — K3189 Other diseases of stomach and duodenum: Secondary | ICD-10-CM | POA: Diagnosis not present

## 2018-08-26 DIAGNOSIS — M797 Fibromyalgia: Secondary | ICD-10-CM | POA: Insufficient documentation

## 2018-08-26 DIAGNOSIS — K921 Melena: Secondary | ICD-10-CM | POA: Diagnosis not present

## 2018-08-26 DIAGNOSIS — Z7951 Long term (current) use of inhaled steroids: Secondary | ICD-10-CM | POA: Diagnosis not present

## 2018-08-26 HISTORY — PX: BIOPSY: SHX5522

## 2018-08-26 HISTORY — PX: COLONOSCOPY: SHX5424

## 2018-08-26 HISTORY — PX: ESOPHAGOGASTRODUODENOSCOPY: SHX5428

## 2018-08-26 SURGERY — COLONOSCOPY
Anesthesia: Moderate Sedation

## 2018-08-26 MED ORDER — MIDAZOLAM HCL 5 MG/5ML IJ SOLN
INTRAMUSCULAR | Status: DC | PRN
  Administered 2018-08-26 (×4): 2 mg via INTRAVENOUS

## 2018-08-26 MED ORDER — STERILE WATER FOR IRRIGATION IR SOLN
Status: DC | PRN
  Administered 2018-08-26: 1.5 mL

## 2018-08-26 MED ORDER — LIDOCAINE VISCOUS HCL 2 % MT SOLN
OROMUCOSAL | Status: DC | PRN
  Administered 2018-08-26: 1 via OROMUCOSAL

## 2018-08-26 MED ORDER — BENEFIBER DRINK MIX PO PACK: 4.0000 g | PACK | Freq: Every day | ORAL | Status: DC

## 2018-08-26 MED ORDER — HYDROCORTISONE ACETATE 25 MG RE SUPP: 25.0000 mg | Freq: Every day | RECTAL | 1 refills | Status: DC

## 2018-08-26 MED ORDER — MEPERIDINE HCL 50 MG/ML IJ SOLN
INTRAMUSCULAR | Status: AC
  Filled 2018-08-26: qty 1

## 2018-08-26 MED ORDER — PANTOPRAZOLE SODIUM 40 MG PO TBEC: 40.0000 mg | DELAYED_RELEASE_TABLET | Freq: Every day | ORAL | 2 refills | Status: DC

## 2018-08-26 MED ORDER — LIDOCAINE VISCOUS HCL 2 % MT SOLN
OROMUCOSAL | Status: AC
  Filled 2018-08-26: qty 15

## 2018-08-26 MED ORDER — MIDAZOLAM HCL 5 MG/5ML IJ SOLN
INTRAMUSCULAR | Status: AC
  Filled 2018-08-26: qty 10

## 2018-08-26 MED ORDER — SODIUM CHLORIDE 0.9 % IV SOLN
INTRAVENOUS | Status: DC
  Administered 2018-08-26: 1000 mL via INTRAVENOUS

## 2018-08-26 MED ORDER — MEPERIDINE HCL 50 MG/ML IJ SOLN
INTRAMUSCULAR | Status: DC | PRN
  Administered 2018-08-26 (×2): 25 mg via INTRAVENOUS

## 2018-08-26 NOTE — Op Note (Signed)
[facility] Patient Name: Betty Golden Procedure Date: 08/26/2018 1:30 PM MRN: [id number] Date of Birth: 04/29/1946 Attending MD: Lionel December , MD CSN: [id number] Age: 73 Admit Type: Outpatient Procedure:                Upper GI endoscopy Indications:              Epigastric abdominal pain Providers:                Lionel December, MD, Buel Ream. Thomasena Edis RN, RN, Dyann Ruddle Referring MD:             Zachery Dauer, MD Medicines:                Lidocaine spray, Meperidine 50 mg IV, Midazolam 6                            mg IV Complications:            No immediate complications. Estimated Blood Loss:     Estimated blood loss was minimal. Procedure:                Pre-Anesthesia Assessment:                           - Prior to the procedure, a History and Physical                            was performed, and patient medications and                            allergies were reviewed. The patient's tolerance of                            previous anesthesia was also reviewed. The risks                            and benefits of the procedure and the sedation                            options and risks were discussed with the patient.                            All questions were answered, and informed consent                            was obtained. Prior Anticoagulants: The patient has                            taken no previous anticoagulant or antiplatelet                            agents. ASA Grade Assessment: III - A patient with  severe systemic disease. After reviewing the risks                            and benefits, the patient was deemed in                            satisfactory condition to undergo the procedure.                           After obtaining informed consent, the endoscope was                            passed under direct vision. Throughout the                            procedure, the patient's  blood pressure, pulse, and                            oxygen saturations were monitored continuously. The                            GIF-H190 ([id number] scope was introduced through the                            mouth, and advanced to the second part of duodenum.                            The upper GI endoscopy was accomplished without                            difficulty. The patient tolerated the procedure                            well. Scope In: 2:05:00 PM Scope Out: 2:12:19 PM Total Procedure Duration: 0 hours 7 minutes 19 seconds  Findings:      The examined esophagus was normal.      The Z-line was irregular and was found 37 cm from the incisors.      A small healed ulcer was found on the posterior wall of the gastric       antrum.      Patchy mild inflammation characterized by congestion (edema) and       erythema was found in the gastric antrum. Biopsies were taken with a       cold forceps for histology.      The exam of the stomach was otherwise normal.      The duodenal bulb and second portion of the duodenum were normal. Impression:               - Normal esophagus.                           - Z-line irregular, 37 cm from the incisors.                           - Scar in the gastric antrum indicative of  healed                            ulcer (posterior wall).                           - Gastritis. Biopsied.                           - Normal duodenal bulb and second portion of the                            duodenum. Moderate Sedation:      Moderate (conscious) sedation was administered by the endoscopy nurse       and supervised by the endoscopist. The following parameters were       monitored: oxygen saturation, heart rate, blood pressure, CO2       capnography and response to care. Total physician intraservice time was       15 minutes. Recommendation:           - Patient has a contact number available for                            emergencies. The signs and  symptoms of potential                            delayed complications were discussed with the                            patient. Return to normal activities tomorrow.                            Written discharge instructions were provided to the                            patient.                           - Resume previous diet today.                           - Continue present medications.                           - No aspirin, ibuprofen, naproxen, or other                            non-steroidal anti-inflammatory drugs for 1 day.                           - Await pathology results.                           - Use Protonix (pantoprazole) 40 mg PO daily today.                           - See the other procedure note for documentation  of                            additional recommendations. Procedure Code(s):        --- Professional ---                           [phone], Esophagogastroduodenoscopy, flexible,                            transoral; with biopsy, single or multiple                           G0500, Moderate sedation services provided by the                            same physician or other qualified health care                            professional performing a gastrointestinal                            endoscopic service that sedation supports,                            requiring the presence of an independent trained                            observer to assist in the monitoring of the                            patient's level of consciousness and physiological                            status; initial 15 minutes of intra-service time;                            patient age 64 years or older (additional time may                            be reported with 62130, as appropriate) Diagnosis Code(s):        --- Professional ---                           K22.8, Other specified diseases of esophagus                           K31.89, Other diseases of stomach and  duodenum                           K29.70, Gastritis, unspecified, without bleeding                           R10.13, Epigastric pain CPT copyright 2018 [employer]. All rights reserved. The codes documented in this report are preliminary and upon coder review may  be revised to meet current compliance requirements. Lionel DecemberNajeeb Mashell Sieben, MD Lionel DecemberNajeeb Milisa Kimbell, MD 08/26/2018 2:49:16 PM This report has been signed electronically. Number of Addenda: 0

## 2018-08-26 NOTE — H&P (Signed)
Betty Golden is an 73 y.o. female.   Chief Complaint: Patient is here for EGD and colonoscopy. HPI: Patient is a 73 year old  female who presents with over 335-month history of intermittent epigastric pain.  She gives remote history of peptic ulcer disease.  She states she has been taking BC Goody powders frequently until her recent office visit.  She has not had any hematemesis or melena.  She also gives history of rectal bleeding intermittently for the last 1 year.  Blood is always fresh in amount is small to moderate.  Bleeding occurs with bowel movements and sometimes when she urinates.  She denies diarrhea constipation anorexia or weight loss.  She has been seen in emergency room on 2 occasions.  Initially she was seen about 2 months ago and LFTs are normal.  She was treated for hyperkalemia.  She had abdominal pelvic CT was unremarkable.  She returned with an episode of pain on 07/06/2018 and one splint transaminases are normal.  Ultrasound was negative for choledocholithiasis.  She is status post cholecystectomy. Family history is negative for CRC.   Past Medical History:  Diagnosis Date  . Arthritis   . Asthma   . C. difficile colitis   . Cervical stenosis of spine   . Chronic headaches   . Chronic neck pain   . Depression   . Diverticulosis   . Essential hypertension   . Fibromyalgia   . GERD (gastroesophageal reflux disease)   . History of Salmonella gastroenteritis   . HNP (herniated nucleus pulposus), lumbar   . Hyperlipidemia   . IBS (irritable bowel syndrome)   . Lung nodule     Past Surgical History:  Procedure Laterality Date  . APPENDECTOMY  1965  . BREAST BIOPSY    . BREAST REDUCTION SURGERY  1994  . CHOLECYSTECTOMY  1975  . COLONOSCOPY N/A 08/19/2013   Procedure: COLONOSCOPY;  Surgeon: Malissa HippoNajeeb U Rehman, MD;  Location: [facility];  Service: Endoscopy;  Laterality: N/A;  155-moved to 140 Ann to notify pt  . fibromyalgia    . LUMBAR LAMINECTOMY/DECOMPRESSION  MICRODISCECTOMY Bilateral 09/20/2015   Procedure: MICRO LUMBAR DECOMPRESSION L5-S1 BILATERAL    (1 LEVEL);  Surgeon: Jene EveryJeffrey Beane, MD;  Location: [facility];  Service: Orthopedics;  Laterality: Bilateral;  . rheumatoid arthritis    . Sinus Surgery    . TONSILLECTOMY  1968    Family History  Problem Relation Age of Onset  . Heart disease Mother   . Hypertension Mother   . Asthma Mother   . Heart disease Father   . Stroke Father   . Hypertension Father   . Stroke Sister   . Asthma Sister   . Hypertension Brother   . Asthma Brother   . Asthma Maternal Grandmother   . Heart disease Maternal Grandmother   . Diabetes Maternal Grandfather   . Colon cancer Neg Hx    Social History:  reports that she quit smoking about 29 years ago. Her smoking use included cigarettes. She has a 20.00 pack-year smoking history. She has never used smokeless tobacco. She reports that she does not drink alcohol or use drugs.  Allergies:  Allergies  Allergen Reactions  . Sulfa Antibiotics Rash    Facility-Administered Medications Prior to Admission  Medication Dose Route Frequency Provider Last Rate Last Dose  . Benralizumab SOSY 30 mg  30 mg Subcutaneous Q8 Thomes DinningWeeks Gallagher, Joel Louis, MD   30 mg at 08/19/18 1040   Medications Prior to Admission  Medication  Sig Dispense Refill  . albuterol (PROAIR HFA) 108 (90 Base) MCG/ACT inhaler Inhale 2 puffs into the lungs every 4 (four) hours as needed for wheezing or shortness of breath. 1 Inhaler 1  . DULoxetine (CYMBALTA) 20 MG capsule Take 20 mg by mouth 2 (two) times daily.    . Fluticasone Furoate (ARNUITY ELLIPTA) 100 MCG/ACT AEPB Inhale 1 Dose into the lungs daily. 30 each 5  . folic acid (FOLVITE) 1 MG tablet Take 1 mg by mouth daily.    Marland Kitchen. gabapentin (NEURONTIN) 300 MG capsule Take 300 mg by mouth 3 (three) times daily.  3  . losartan-hydrochlorothiazide (HYZAAR) 100-12.5 MG per tablet Take 1 tablet by mouth daily.    . methotrexate 2.5 MG tablet Take 7.5  mg by mouth once a week. TAKES 3 PILLS TWICE A WEEK - Sundays and Monday    . simvastatin (ZOCOR) 20 MG tablet Take 20 mg by mouth at bedtime.       No results found for this or any previous visit (from the past 48 hour(s)). No results found.  ROS  Blood pressure 108/79, pulse 99, temperature (!) 97.4 F (36.3 C), temperature source Oral, resp. rate 20, height 5\' 4"  (1.626 m), weight 75.8 kg, SpO2 100 %. Physical Exam  Constitutional: She appears well-developed and well-nourished.  HENT:  Mouth/Throat: Oropharynx is clear and moist.  Eyes: Conjunctivae are normal. No scleral icterus.  Neck: No thyromegaly present.  Cardiovascular: Normal rate, regular rhythm and normal heart sounds.  No murmur heard. Respiratory: Effort normal and breath sounds normal.  GI:  Abdomen is full.  Abdomen is soft.  She has mild to moderate midepigastric tenderness.  No organomegaly or masses.  Musculoskeletal:        General: No edema.  Lymphadenopathy:    She has no cervical adenopathy.  Neurological: She is alert.  Skin: Skin is warm and dry.     Assessment/Plan Epigastric pain. Hematochezia. Diagnostic EGD and colonoscopy.  Lionel DecemberNajeeb Rehman, MD 08/26/2018, 1:52 PM

## 2018-08-26 NOTE — Discharge Instructions (Signed)
No aspirin or NSAIDs for 24 hours. Resume usual medications as before. Pantoprazole 40 mg by mouth 30 minutes before breakfast daily.  Can take first dose before evening meal today. Anusol HC Suppository 1 per rectum daily at bedtime for 2 weeks. Benefiber 4 g by mouth daily at bedtime. High-fiber diet. No driving for 24 hours. Physician will call with biopsy results. Keep symptom diary as to frequency of bleeding episodes for next 4 weeks.       Colonoscopy, Adult, Care After This sheet gives you information about how to care for yourself after your procedure. Your doctor may also give you more specific instructions. If you have problems or questions, call your doctor. What can I expect after the procedure? After the procedure, it is common to have:  A small amount of blood in your poop for 24 hours.  Some gas.  Mild cramping or bloating in your belly. Follow these instructions at home: General instructions  For the first 24 hours after the procedure: ? Do not drive or use machinery. ? Do not sign important documents. ? Do not drink alcohol. ? Do your daily activities more slowly than normal. ? Eat foods that are soft and easy to digest.  Take over-the-counter or prescription medicines only as told by your doctor. To help cramping and bloating:   Try walking around.  Put heat on your belly (abdomen) as told by your doctor. Use a heat source that your doctor recommends, such as a moist heat pack or a heating pad. ? Put a towel between your skin and the heat source. ? Leave the heat on for 20-30 minutes. ? Remove the heat if your skin turns bright red. This is especially important if you cannot feel pain, heat, or cold. You can get burned. Eating and drinking   Drink enough fluid to keep your pee (urine) clear or pale yellow.  Return to your normal diet as told by your doctor. Avoid heavy or fried foods that are hard to digest.  Avoid drinking alcohol for as long as  told by your doctor. Contact a doctor if:  You have blood in your poop (stool) 2-3 days after the procedure. Get help right away if:  You have more than a small amount of blood in your poop.  You see large clumps of tissue (blood clots) in your poop.  Your belly is swollen.  You feel sick to your stomach (nauseous).  You throw up (vomit).  You have a fever.  You have belly pain that gets worse, and medicine does not help your pain. Summary  After the procedure, it is common to have a small amount of blood in your poop. You may also have mild cramping and bloating in your belly.  For the first 24 hours after the procedure, do not drive or use machinery, do not sign important documents, and do not drink alcohol.  Get help right away if you have a lot of blood in your poop, feel sick to your stomach, have a fever, or have more belly pain. This information is not intended to replace advice given to you by your health care provider. Make sure you discuss any questions you have with your health care provider. Document Released: 08/17/2010 Document Revised: 05/15/2017 Document Reviewed: 04/08/2016 Elsevier Interactive Patient Education  2019 Elsevier Inc.    Upper Endoscopy, Adult, Care After This sheet gives you information about how to care for yourself after your procedure. Your health care provider may also give  you more specific instructions. If you have problems or questions, contact your health care provider. What can I expect after the procedure? After the procedure, it is common to have:  A sore throat.  Mild stomach pain or discomfort.  Bloating.  Nausea. Follow these instructions at home:   Follow instructions from your health care provider about what to eat or drink after your procedure.  Return to your normal activities as told by your health care provider. Ask your health care provider what activities are safe for you.  Take over-the-counter and prescription  medicines only as told by your health care provider.  Do not drive for 24 hours if you were given a sedative during your procedure.  Keep all follow-up visits as told by your health care provider. This is important. Contact a health care provider if you have:  A sore throat that lasts longer than one day.  Trouble swallowing. Get help right away if:  You vomit blood or your vomit looks like coffee grounds.  You have: ? A fever. ? Bloody, black, or tarry stools. ? A severe sore throat or you cannot swallow. ? Difficulty breathing. ? Severe pain in your chest or abdomen. Summary  After the procedure, it is common to have a sore throat, mild stomach discomfort, bloating, and nausea.  Do not drive for 24 hours if you were given a sedative during the procedure.  Follow instructions from your health care provider about what to eat or drink after your procedure.  Return to your normal activities as told by your health care provider. This information is not intended to replace advice given to you by your health care provider. Make sure you discuss any questions you have with your health care provider. Document Released: 01/14/2012 Document Revised: 12/15/2017 Document Reviewed: 12/15/2017 Elsevier Interactive Patient Education  2019 [employer]     Diverticulosis  Diverticulosis is a condition that develops when small pouches (diverticula) form in the wall of the large intestine (colon). The colon is where water is absorbed and stool is formed. The pouches form when the inside layer of the colon pushes through weak spots in the outer layers of the colon. You may have a few pouches or many of them. What are the causes? The cause of this condition is not known. What increases the risk? The following factors may make you more likely to develop this condition:  Being older than age 71. Your risk for this condition increases with age. Diverticulosis is rare among people younger than  age 10830. By age 71, many people have it.  Eating a low-fiber diet.  Having frequent constipation.  Being overweight.  Not getting enough exercise.  Smoking.  Taking over-the-counter pain medicines, like aspirin and ibuprofen.  Having a family history of diverticulosis. What are the signs or symptoms? In most people, there are no symptoms of this condition. If you do have symptoms, they may include:  Bloating.  Cramps in the abdomen.  Constipation or diarrhea.  Pain in the lower left side of the abdomen. How is this diagnosed? This condition is most often diagnosed during an exam for other colon problems. Because diverticulosis usually has no symptoms, it often cannot be diagnosed independently. This condition may be diagnosed by:  Using a flexible scope to examine the colon (colonoscopy).  Taking an X-ray of the colon after dye has been put into the colon (barium enema).  Doing a CT scan. How is this treated? You may not need treatment for  this condition if you have never developed an infection related to diverticulosis. If you have had an infection before, treatment may include:  Eating a high-fiber diet. This may include eating more fruits, vegetables, and grains.  Taking a fiber supplement.  Taking a live bacteria supplement (probiotic).  Taking medicine to relax your colon.  Taking antibiotic medicines. Follow these instructions at home:  Drink 6-8 glasses of water or more each day to prevent constipation.  Try not to strain when you have a bowel movement.  If you have had an infection before: ? Eat more fiber as directed by your health care provider or your diet and nutrition specialist (dietitian). ? Take a fiber supplement or probiotic, if your health care provider approves.  Take over-the-counter and prescription medicines only as told by your health care provider.  If you were prescribed an antibiotic, take it as told by your health care provider. Do  not stop taking the antibiotic even if you start to feel better.  Keep all follow-up visits as told by your health care provider. This is important. Contact a health care provider if:  You have pain in your abdomen.  You have bloating.  You have cramps.  You have not had a bowel movement in 3 days. Get help right away if:  Your pain gets worse.  Your bloating becomes very bad.  You have a fever or chills, and your symptoms suddenly get worse.  You vomit.  You have bowel movements that are bloody or black.  You have bleeding from your rectum. Summary  Diverticulosis is a condition that develops when small pouches (diverticula) form in the wall of the large intestine (colon).  You may have a few pouches or many of them.  This condition is most often diagnosed during an exam for other colon problems.  If you have had an infection related to diverticulosis, treatment may include increasing the fiber in your diet, taking supplements, or taking medicines. This information is not intended to replace advice given to you by your health care provider. Make sure you discuss any questions you have with your health care provider. Document Released: 04/11/2004 Document Revised: 06/03/2016 Document Reviewed: 06/03/2016 Elsevier Interactive Patient Education  2019 Elsevier Inc.    High-Fiber Diet Fiber, also called dietary fiber, is a type of carbohydrate that is found in fruits, vegetables, whole grains, and beans. A high-fiber diet can have many health benefits. Your health care provider may recommend a high-fiber diet to help:  Prevent constipation. Fiber can make your bowel movements more regular.  Lower your cholesterol.  Relieve the following conditions: ? Swelling of veins in the anus (hemorrhoids). ? Swelling and irritation (inflammation) of specific areas of the digestive tract (uncomplicated diverticulosis). ? A problem of the large intestine (colon) that sometimes causes  pain and diarrhea (irritable bowel syndrome, IBS).  Prevent overeating as part of a weight-loss plan.  Prevent heart disease, type 2 diabetes, and certain cancers. What is my plan? The recommended daily fiber intake in grams (g) includes:  38 g for men age 13 or younger.  30 g for men over age 47.  25 g for women age 33 or younger.  21 g for women over age 20. You can get the recommended daily intake of dietary fiber by:  Eating a variety of fruits, vegetables, grains, and beans.  Taking a fiber supplement, if it is not possible to get enough fiber through your diet. What do I need to know about a  high-fiber diet?  It is better to get fiber through food sources rather than from fiber supplements. There is not a lot of research about how effective supplements are.  Always check the fiber content on the nutrition facts label of any prepackaged food. Look for foods that contain 5 g of fiber or more per serving.  Talk with a diet and nutrition specialist (dietitian) if you have questions about specific foods that are recommended or not recommended for your medical condition, especially if those foods are not listed below.  Gradually increase how much fiber you consume. If you increase your intake of dietary fiber too quickly, you may have bloating, cramping, or gas.  Drink plenty of water. Water helps you to digest fiber. What are tips for following this plan?  Eat a wide variety of high-fiber foods.  Make sure that half of the grains that you eat each day are whole grains.  Eat breads and cereals that are made with whole-grain flour instead of refined flour or white flour.  Eat brown rice, bulgur wheat, or millet instead of white rice.  Start the day with a breakfast that is high in fiber, such as a cereal that contains 5 g of fiber or more per serving.  Use beans in place of meat in soups, salads, and pasta dishes.  Eat high-fiber snacks, such as berries, raw vegetables,  nuts, and popcorn.  Choose whole fruits and vegetables instead of processed forms like juice or sauce. What foods can I eat?  Fruits Berries. Pears. Apples. Oranges. Avocado. Prunes and raisins. Dried figs. Vegetables Sweet potatoes. Spinach. Kale. Artichokes. Cabbage. Broccoli. Cauliflower. Green peas. Carrots. Squash. Grains Whole-grain breads. Multigrain cereal. Oats and oatmeal. Brown rice. Barley. Bulgur wheat. Millet. Quinoa. Bran muffins. Popcorn. Rye wafer crackers. Meats and other proteins Navy, kidney, and pinto beans. Soybeans. Split peas. Lentils. Nuts and seeds. Dairy Fiber-fortified yogurt. Beverages Fiber-fortified soy milk. Fiber-fortified orange juice. Other foods Fiber bars. The items listed above may not be a complete list of recommended foods and beverages. Contact a dietitian for more options. What foods are not recommended? Fruits Fruit juice. Cooked, strained fruit. Vegetables Fried potatoes. Canned vegetables. Well-cooked vegetables. Grains White bread. Pasta made with refined flour. White rice. Meats and other proteins Fatty cuts of meat. Fried chicken or fried fish. Dairy Milk. Yogurt. Cream cheese. Sour cream. Fats and oils Butters. Beverages Soft drinks. Other foods Cakes and pastries. The items listed above may not be a complete list of foods and beverages to avoid. Contact a dietitian for more information. Summary  Fiber is a type of carbohydrate. It is found in fruits, vegetables, whole grains, and beans.  There are many health benefits of eating a high-fiber diet, such as preventing constipation, lowering blood cholesterol, helping with weight loss, and reducing your risk of heart disease, diabetes, and certain cancers.  Gradually increase your intake of fiber. Increasing too fast can result in cramping, bloating, and gas. Drink plenty of water while you increase your fiber.  The best sources of fiber include whole fruits and vegetables,  whole grains, nuts, seeds, and beans. This information is not intended to replace advice given to you by your health care provider. Make sure you discuss any questions you have with your health care provider. Document Released: 07/15/2005 Document Revised: 05/19/2017 Document Reviewed: 05/19/2017 Elsevier Interactive Patient Education  2019 [employer].

## 2018-08-26 NOTE — Op Note (Signed)
[facility] Patient Name: Betty Golden Procedure Date: 08/26/2018 1:24 PM MRN: [id number] Date of Birth: [birth year] Attending MD: Lionel December , MD CSN: [id number] Age: 73 Admit Type: Outpatient Procedure:                Colonoscopy Indications:              Rectal bleeding Providers:                Lionel December, MD, Buel Ream. Thomasena Edis RN, RN, Dyann Ruddle Referring MD:             Zachery Dauer, MD Medicines:                Midazolam 2 mg IV Complications:            No immediate complications. Estimated Blood Loss:     Estimated blood loss: none. Procedure:                Pre-Anesthesia Assessment:                           - Prior to the procedure, a History and Physical                            was performed, and patient medications and                            allergies were reviewed. The patient's tolerance of                            previous anesthesia was also reviewed. The risks                            and benefits of the procedure and the sedation                            options and risks were discussed with the patient.                            All questions were answered, and informed consent                            was obtained. Prior Anticoagulants: The patient has                            taken no previous anticoagulant or antiplatelet                            agents. ASA Grade Assessment: III - A patient with                            severe systemic disease. After reviewing the risks  and benefits, the patient was deemed in                            satisfactory condition to undergo the procedure.                           After obtaining informed consent, the colonoscope                            was passed under direct vision. Throughout the                            procedure, the patient's blood pressure, pulse, and                            oxygen saturations were monitored  continuously. The                            [id number]-[id number] ([id number]) was introduced through the                            anus and advanced to the the cecum, identified by                            appendiceal orifice and ileocecal valve. The                            colonoscopy was performed without difficulty. The                            patient tolerated the procedure well. The quality                            of the bowel preparation was good. The ileocecal                            valve, appendiceal orifice, and rectum were                            photographed. Scope In: 2:17:16 PM Scope Out: 2:34:21 PM Scope Withdrawal Time: 0 hours 10 minutes 26 seconds  Total Procedure Duration: 0 hours 17 minutes 5 seconds  Findings:      The perianal and digital rectal examinations were normal.      Multiple small-mouthed diverticula were found in the entire colon.      External hemorrhoids were found during retroflexion. The hemorrhoids       were medium-sized. Impression:               - Diverticulosis in the entire examined colon.                           - External hemorrhoids.                           - No specimens collected. Moderate Sedation:  Moderate (conscious) sedation was administered by the endoscopy nurse       and supervised by the endoscopist. The following parameters were       monitored: oxygen saturation, heart rate, blood pressure, CO2       capnography and response to care. Total physician intraservice time was       17 minutes. Recommendation:           - Patient has a contact number available for                            emergencies. The signs and symptoms of potential                            delayed complications were discussed with the                            patient. Return to normal activities tomorrow.                            Written discharge instructions were provided to the                            patient.                            - High fiber diet today.                           - Continue present medications.                           - Use Benefiber 4 g PO daily at bedtime.                           - Use hydrocortisone suppository 25 mg 1 per rectum                            daily at bedtime for 2 weeks.                           - Symptom diary for 4 weeks as to bleeding episodes.                           - Repeat colonoscopy in 10 years for screening                            purposes. Procedure Code(s):        --- Professional ---                           [id number], Colonoscopy, flexible; diagnostic, including                            collection of specimen(s) by brushing or washing,  when performed (separate procedure)                           G0500, Moderate sedation services provided by the                            same physician or other qualified health care                            professional performing a gastrointestinal                            endoscopic service that sedation supports,                            requiring the presence of an independent trained                            observer to assist in the monitoring of the                            patient's level of consciousness and physiological                            status; initial 15 minutes of intra-service time;                            patient age 18 years or older (additional time may                            be reported with 99357, as appropriate) Diagnosis Code(s):        --- Professional ---                           K64.4, Residual hemorrhoidal skin tags                           K62.5, Hemorrhage of anus and rectum                           K57.30, Diverticulosis of large intestine without                            perforation or abscess without bleeding CPT copyright 2018 American Medical Association. All rights reserved. The codes documented in this report are preliminary and  upon coder review may  be revised to meet current compliance requirements. Lionel December, MD Lionel December, MD 08/26/2018 2:55:05 PM This report has been signed electronically. Number of Addenda: 0

## 2018-08-28 ENCOUNTER — Encounter (HOSPITAL_COMMUNITY): Payer: Self-pay | Admitting: Internal Medicine

## 2018-09-15 ENCOUNTER — Telehealth (INDEPENDENT_AMBULATORY_CARE_PROVIDER_SITE_OTHER): Payer: Self-pay | Admitting: Internal Medicine

## 2018-09-15 NOTE — Telephone Encounter (Signed)
Patient called in her progress report - stated she is still bleeding and having some pain even when she takes her medicaiton

## 2018-09-22 ENCOUNTER — Other Ambulatory Visit (INDEPENDENT_AMBULATORY_CARE_PROVIDER_SITE_OTHER): Payer: Self-pay | Admitting: Internal Medicine

## 2018-09-22 MED ORDER — MESALAMINE 1000 MG RE SUPP: 1000.0000 mg | Freq: Every day | RECTAL | 0 refills | Status: DC

## 2018-09-22 NOTE — Telephone Encounter (Signed)
Talked with patient. Continues to have rectal bleeding when her bowels move her also when she urinates.  She has anal pain. She also complains of abdominal pain.  Recent EGD was negative for peptic ulcer disease. We will treat her rectal ulcer with mesalamine suppositories if covered. Also asked patient to talk with the rheumatologist if methotrexate dose could be reduced.  That may be the reason her ulcer is not healing and also may be causing her abdominal pain.

## 2018-09-29 ENCOUNTER — Ambulatory Visit: Payer: Medicare Other | Admitting: Allergy & Immunology

## 2018-10-07 ENCOUNTER — Ambulatory Visit: Payer: Medicare Other | Admitting: Allergy & Immunology

## 2018-10-07 ENCOUNTER — Other Ambulatory Visit: Payer: Self-pay

## 2018-10-07 ENCOUNTER — Encounter: Payer: Self-pay | Admitting: Allergy & Immunology

## 2018-10-07 VITALS — BP 146/76 | HR 88 | Resp 16

## 2018-10-07 DIAGNOSIS — J310 Chronic rhinitis: Secondary | ICD-10-CM

## 2018-10-07 DIAGNOSIS — J4550 Severe persistent asthma, uncomplicated: Secondary | ICD-10-CM

## 2018-10-07 DIAGNOSIS — J0141 Acute recurrent pansinusitis: Secondary | ICD-10-CM | POA: Diagnosis not present

## 2018-10-07 MED ORDER — FLUTICASONE FUROATE 100 MCG/ACT IN AEPB: 1.0000 | INHALATION_SPRAY | Freq: Every day | RESPIRATORY_TRACT | 5 refills | Status: DC

## 2018-10-07 MED ORDER — AMOXICILLIN-POT CLAVULANATE 875-125 MG PO TABS: 1.0000 | ORAL_TABLET | Freq: Two times a day (BID) | ORAL | 0 refills | Status: AC

## 2018-10-07 MED ORDER — ALBUTEROL SULFATE HFA 108 (90 BASE) MCG/ACT IN AERS: 2.0000 | INHALATION_SPRAY | RESPIRATORY_TRACT | 1 refills | Status: DC | PRN

## 2018-10-07 NOTE — Patient Instructions (Addendum)
1. Chronic nonseasonal allergic rhinitis - Continue with fluticasone two sprays per nostril but decrease to once daily. - Continue with cetirizine (Zyrtec).    2. Moderate persistent asthma, uncomplicated - Lung testing looked normal. - We will change your Arnuity one puff daily during flares - Daily controller medication(s): Fasenra every 8 weeks - Prior to physical activity: albuterol 2 puffs 10-15 minutes before physical activity. - Rescue medications: albuterol 4 puffs every 4-6 hours as needed - Changes during respiratory infections or worsening symptoms: Add on Arnuity 1 puff once daily for TWO WEEKS. - Asthma control goals:  * Full participation in all desired activities (may need albuterol before activity) * Albuterol use two time or less a week on average (not counting use with activity) * Cough interfering with sleep two time or less a month * Oral steroids no more than once a year * No hospitalizations  3. Acute sinusitis - Start the prednisone pack provided today. - Start Augmentin 875mg  twice daily for ten days.   4. Return in about 6 months (around 04/09/2019).   Please inform us of any Emergency Department visits, hospitalizations, or changes in symptoms. Call us before going to the ED for breathing or allergy symptoms since we might be able to fit you in for a sick visit. Feel free to contact us anytime with any questions, problems, or concerns.  It was a pleasure to see you again today!  Websites that have reliable patient information: 1. American Academy of Asthma, Allergy, and Immunology: www.aaaai.org 2. Food Allergy Research and Education (FARE): foodallergy.org 3. Mothers of Asthmatics: http://www.asthmacommunitynetwork.org 4. American College of Allergy, Asthma, and Immunology: MissingWeapons.ca   Make sure you are registered to vote! If you have moved or changed any of your contact information, you will need to get this updated before  voting!    **Please note: [facility] in [place] will be moving to [address] ([address]) as of mid October 2019! Be sure to call the office to confirm the location in the future!  ]

## 2018-10-07 NOTE — Progress Notes (Signed)
FOLLOW UP  Date of Service/Encounter:  10/07/18   Assessment:   Moderate persistent asthma, uncomplicated   Acute recurrent pansinusitis   Chronic allergic rhinitis  Plan/Recommendations:   1. Chronic nonseasonal allergic rhinitis - Continue with fluticasone two sprays per nostril but decrease to once daily. - Continue with cetirizine (Zyrtec).    2. Moderate persistent asthma, uncomplicated - Lung testing looked normal. - We will change your Arnuity one puff daily during flares - Daily controller medication(s): Fasenra every 8 weeks - Prior to physical activity: albuterol 2 puffs 10-15 minutes before physical activity. - Rescue medications: albuterol 4 puffs every 4-6 hours as needed - Changes during respiratory infections or worsening symptoms: Add on Arnuity 1 puff once daily for TWO WEEKS. - Asthma control goals:  * Full participation in all desired activities (may need albuterol before activity) * Albuterol use two time or less a week on average (not counting use with activity) * Cough interfering with sleep two time or less a month * Oral steroids no more than once a year * No hospitalizations  3. Acute sinusitis - Start the prednisone pack provided today. - Start Augmentin 875mg  twice daily for ten days.   4. Return in about 6 months (around 04/09/2019).  Subjective:   Betty Golden is a 73 y.o. female presenting today for follow up of  Chief Complaint  Patient presents with  . Asthma    Betty Golden has a history of the following: Patient Active Problem List   Diagnosis Date Noted  . Gastritis and gastroduodenitis 07/08/2018  . Rectal bleeding 06/29/2018  . Fibromyalgia 09/08/2017  . Spinal stenosis of lumbar region 09/20/2015  . 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 Moderate persistent asthma 07/03/2015  . Allergic rhinitis 07/03/2015  . Colitis due to Clostridium difficile 02/10/2015  . Abdominal pain 02/10/2015  . Nausea and vomiting  02/10/2015  . Acute renal failure (HCC) 02/10/2015  . Dehydration 02/10/2015  . Hypoxia 02/10/2015  . Hyponatremia 02/10/2015  . Hypokalemia 02/10/2015  . COPD (chronic obstructive pulmonary disease) (HCC)   . Unspecified constipation 07/15/2013  . DYSPNEA 04/05/2008  . Essential hypertension 03/10/2008  . Headache(784.0) 03/10/2008  . Cough 03/10/2008    History obtained from: chart review and patient.  Betty Golden is a 73 y.o. female presenting for a follow up visit. She was last seen in September 2019. At that time, she was doing very well with Arnuity one puff once daily and Fasenra every 8 weeks. This has been an excellent combination for her. For her rhinitis, we recommended changing to fluticasone two sprays per nostril up to twice daily and cetirizine. We stopped her montelukast to try to simplify her regimen. We started her on Levaquin 500mg  once daily for ten days.   Since the last visit, she has mostly done well. She does report that she has worsening sinus symptoms.   Asthma/Respiratory Symptom History: She remains on her Harrington Challenger every 8 weeks. She is also using the Arnuity on a daily basis. She has not needed any prednisone at all. She did have some worsening breathing when she was on the methotrexate. She did stop this now and is on Humira now for her RA. Breathing has now improved.  ACT score today is 13, indicating poor asthma control.  Allergic Rhinitis Symptom History: She reports that she is having some problems with headache and sinus pressure. This has been going on for a period of three weeks. She does have some headaches that started  last week. She is using a nose spray without improvmeent. She is not on cetirizine at this point. She has been throat clearing a lot as well.    Since last visit, she has had no antibiotics whatsoever.  She does not see an ENT any longer, and while she is open to a referral, she would like to try a round of antibiotics and steroids first.  She  otherwise has no history of recurrent infections.  Otherwise, there have been no changes to her past medical history, surgical history, family history, or social history.    Review of Systems  Constitutional: Negative.  Negative for fever, malaise/fatigue and weight loss.  HENT: Positive for congestion and sinus pain. Negative for ear discharge and ear pain.   Eyes: Negative for pain, discharge and redness.  Respiratory: Positive for cough. Negative for sputum production, shortness of breath and wheezing.   Cardiovascular: Negative.  Negative for chest pain and palpitations.  Gastrointestinal: Negative for abdominal pain and heartburn.  Skin: Negative.  Negative for itching and rash.  Neurological: Negative for dizziness and headaches.  Endo/Heme/Allergies: Negative for environmental allergies. Does not bruise/bleed easily.       Objective:   Blood pressure (!) 146/76, pulse 88, resp. rate 16, SpO2 97 %. There is no height or weight on file to calculate BMI.   Physical Exam:  Physical Exam  Constitutional: She appears well-developed and well-nourished.  Pleasant talkative female.  HENT:  Head: Normocephalic and atraumatic.  Right Ear: Tympanic membrane, external ear and ear canal normal.  Left Ear: Tympanic membrane, external ear and ear canal normal.  Nose: Mucosal edema and rhinorrhea present. No nasal deformity or septal deviation. No epistaxis. Right sinus exhibits frontal sinus tenderness. Right sinus exhibits no maxillary sinus tenderness. Left sinus exhibits frontal sinus tenderness. Left sinus exhibits no maxillary sinus tenderness.  Mouth/Throat: Uvula is midline and oropharynx is clear and moist. Mucous membranes are not pale and not dry.  No cerumen present.  Eyes: Pupils are equal, round, and reactive to light. Conjunctivae and EOM are normal. Right eye exhibits no chemosis and no discharge. Left eye exhibits no chemosis and no discharge. Right conjunctiva is not  injected. Left conjunctiva is not injected.  Cardiovascular: Normal rate, regular rhythm and normal heart sounds.  Respiratory: Effort normal and breath sounds normal. No accessory muscle usage. No tachypnea. No respiratory distress. She has no wheezes. She has no rhonchi. She has no rales. She exhibits no tenderness.  Lymphadenopathy:    She has no cervical adenopathy.  Neurological: She is alert.  Skin: No abrasion, no petechiae and no rash noted. Rash is not papular, not vesicular and not urticarial. No erythema. No pallor.  Psychiatric: She has a normal mood and affect.     Diagnostic studies:    Spirometry: results normal (FEV1: 1.48/87%, FVC: 1.97/82%, FEV1/FVC: 74%).    Spirometry consistent with normal pattern.   Allergy Studies: none     [name], MD  [facility]

## 2018-10-14 ENCOUNTER — Ambulatory Visit: Payer: Medicare Other

## 2018-10-16 ENCOUNTER — Ambulatory Visit (INDEPENDENT_AMBULATORY_CARE_PROVIDER_SITE_OTHER): Payer: Medicare Other

## 2018-10-16 DIAGNOSIS — J4550 Severe persistent asthma, uncomplicated: Secondary | ICD-10-CM

## 2018-12-01 ENCOUNTER — Encounter (INDEPENDENT_AMBULATORY_CARE_PROVIDER_SITE_OTHER): Payer: Self-pay | Admitting: Internal Medicine

## 2018-12-01 ENCOUNTER — Other Ambulatory Visit: Payer: Self-pay

## 2018-12-01 ENCOUNTER — Ambulatory Visit (INDEPENDENT_AMBULATORY_CARE_PROVIDER_SITE_OTHER): Payer: Medicare Other | Admitting: Internal Medicine

## 2018-12-01 VITALS — BP 154/69 | HR 78 | Temp 98.7°F | Ht 64.0 in | Wt 165.2 lb

## 2018-12-01 DIAGNOSIS — K640 First degree hemorrhoids: Secondary | ICD-10-CM

## 2018-12-01 LAB — HEMOGLOBIN AND HEMATOCRIT, BLOOD
HCT: 36.9 % (ref 35.0–45.0)
Hemoglobin: 12.4 g/dL (ref 11.7–15.5)

## 2018-12-01 MED ORDER — HYDROCORTISONE (PERIANAL) 2.5 % EX CREA: TOPICAL_CREAM | Freq: Two times a day (BID) | CUTANEOUS | 1 refills | Status: DC

## 2018-12-01 NOTE — Progress Notes (Signed)
Subjective:    Patient ID: Betty Golden, female    DOB: 1945/09/20, 73 y.o.   MRN: [id number]  PCP Dr. Suzy Bouchard.   HPI Presents today with c/o rectal bleeding. She has a picture of the bleeding. Also noted was an external hemorrhoids. She has had these symptoms for years. Her appetite is good. No weight loss.   She underwent a colonoscopy in January of this year (rectal bleeding) which revealed Impression:               - Diverticulosis in the entire examined colon.                           - External hemorrhoids. She was givebn an Rx for Hydrocortisone Supp 25mg  1 per rectum daily x 2 weeks.      Review of Systems Past Medical History:  Diagnosis Date  . Arthritis   . Asthma   . C. difficile colitis   . Cervical stenosis of spine   . Chronic headaches   . Chronic neck pain   . Depression   . Diverticulosis   . Essential hypertension   . Fibromyalgia   . GERD (gastroesophageal reflux disease)   . History of Salmonella gastroenteritis   . HNP (herniated nucleus pulposus), lumbar   . Hyperlipidemia   . IBS (irritable bowel syndrome)   . Lung nodule     Past Surgical History:  Procedure Laterality Date  . APPENDECTOMY  1965  . BIOPSY  08/26/2018   Procedure: BIOPSY;  Surgeon: Malissa Hippo, MD;  Location: [facility];  Service: Endoscopy;;  gastric  . BREAST BIOPSY    . BREAST REDUCTION SURGERY  1994  . CHOLECYSTECTOMY  1975  . COLONOSCOPY N/A 08/19/2013   Procedure: COLONOSCOPY;  Surgeon: Malissa Hippo, MD;  Location: [facility];  Service: Endoscopy;  Laterality: N/A;  155-moved to 140 Ann to notify pt  . COLONOSCOPY N/A 08/26/2018   Procedure: COLONOSCOPY;  Surgeon: Malissa Hippo, MD;  Location: [facility];  Service: Endoscopy;  Laterality: N/A;  . ESOPHAGOGASTRODUODENOSCOPY N/A 08/26/2018   Procedure: ESOPHAGOGASTRODUODENOSCOPY (EGD);  Surgeon: Malissa Hippo, MD;  Location: [facility];  Service: Endoscopy;  Laterality: N/A;  .  fibromyalgia    . LUMBAR LAMINECTOMY/DECOMPRESSION MICRODISCECTOMY Bilateral 09/20/2015   Procedure: MICRO LUMBAR DECOMPRESSION L5-S1 BILATERAL    (1 LEVEL);  Surgeon: [name], MD;  Location: [facility];  Service: Orthopedics;  Laterality: Bilateral;  . rheumatoid arthritis    . Sinus Surgery    . TONSILLECTOMY  1968    Allergies  Allergen Reactions  . Sulfa Antibiotics Rash    Current Outpatient Medications on File Prior to Visit  Medication Sig Dispense Refill  . albuterol (PROAIR HFA) 108 (90 Base) MCG/ACT inhaler Inhale 2 puffs into the lungs every 4 (four) hours as needed for wheezing or shortness of breath. 1 Inhaler 1  . DULoxetine (CYMBALTA) 20 MG capsule Take 20 mg by mouth 2 (two) times daily.    . Fluticasone Furoate (ARNUITY ELLIPTA) 100 MCG/ACT AEPB Inhale 1 Dose into the lungs daily. 30 each 5  . gabapentin (NEURONTIN) 300 MG capsule Take 300 mg by mouth 3 (three) times daily.  3  . HUMIRA 40 MG/0.4ML PSKT     . losartan-hydrochlorothiazide (HYZAAR) 100-12.5 MG per tablet Take 1 tablet by mouth daily.    . simvastatin (ZOCOR) 20 MG tablet Take 20 mg by mouth  at bedtime.     . Wheat Dextrin (BENEFIBER DRINK MIX) PACK Take 4 g by mouth at bedtime.     Current Facility-Administered Medications on File Prior to Visit  Medication Dose Route Frequency Provider Last Rate Last Dose  . Benralizumab SOSY 30 mg  30 mg Subcutaneous Q8 Thomes Dinning, MD   30 mg at 10/16/18 1053        Objective:   Physical Exam Blood pressure (!) 154/69, pulse 78, temperature 98.7 F (37.1 C), height 5\' 4"  (1.626 m), weight 165 lb 3.2 oz (74.9 kg). Alert and oriented. Skin warm and dry. Oral mucosa is moist.   . Sclera anicteric, conjunctivae is pink. Thyroid not enlarged. No cervical lymphadenopathy. Lungs clear. Heart regular rate and rhythm.  Abdomen is soft. Bowel sounds are positive. No hepatomegaly. No abdominal masses felt. No tenderness.  No edema to lower extremities.   Rectal: hemorrhoids noted. Not thrombosed.          Assessment & Plan:  Rectal bleeding, probably hemorrhoids. Will call an Rx for Anusol Cream. H and H today.  Will refer to Dr. Lovell Sheehan.

## 2018-12-02 ENCOUNTER — Telehealth (INDEPENDENT_AMBULATORY_CARE_PROVIDER_SITE_OTHER): Payer: Self-pay | Admitting: Internal Medicine

## 2018-12-02 NOTE — Telephone Encounter (Signed)
Patient returned your call.

## 2018-12-02 NOTE — Telephone Encounter (Signed)
Results given to patient

## 2018-12-11 ENCOUNTER — Ambulatory Visit: Payer: Medicare Other

## 2018-12-11 ENCOUNTER — Ambulatory Visit: Payer: Self-pay

## 2018-12-11 ENCOUNTER — Ambulatory Visit: Payer: Medicare Other | Admitting: Allergy & Immunology

## 2018-12-11 ENCOUNTER — Encounter: Payer: Self-pay | Admitting: Allergy & Immunology

## 2018-12-11 ENCOUNTER — Other Ambulatory Visit: Payer: Self-pay

## 2018-12-11 VITALS — BP 138/78 | HR 91 | Resp 16

## 2018-12-11 DIAGNOSIS — J4540 Moderate persistent asthma, uncomplicated: Secondary | ICD-10-CM | POA: Diagnosis not present

## 2018-12-11 DIAGNOSIS — J0141 Acute recurrent pansinusitis: Secondary | ICD-10-CM | POA: Diagnosis not present

## 2018-12-11 DIAGNOSIS — B999 Unspecified infectious disease: Secondary | ICD-10-CM

## 2018-12-11 MED ORDER — AMOXICILLIN-POT CLAVULANATE 875-125 MG PO TABS: 1.0000 | ORAL_TABLET | Freq: Two times a day (BID) | ORAL | 0 refills | Status: AC

## 2018-12-11 NOTE — Progress Notes (Signed)
FOLLOW UP  Date of Service/Encounter:  12/11/18   Assessment:   Moderate persistent asthma, uncomplicated   Acute recurrent pansinusitis  Chronic allergic rhinitis  Rheumatoid arthritis - on Humira   Ms. Procida presents for another sinus infection 2 months after I treated her for her previous 1.  Review of her chart shows that she does get sinus infections 2-3 times per year.  She has been seen by otolaryngology in the distant past, where she was given intranasal injections of steroids evidently.  This was years before I knew here.  We are going to treat her with a course of prednisone today as well as Mucinex and nasal saline rinses.  I did provide her with a prescription for Augmentin to fill if her symptoms do not improve over the weekend.  We may consider a sinus CT if symptoms continue.  I am going to get an immune work-up to make sure her immune system is functioning correctly.  Reassuringly, she is only required antibiotics for sinus infections.  Typically infections isolated to one particular body area are more related to anatomical issues rather than immune deficiencies.  Asthma seems well controlled with the Fasenra every 8 weeks.  We have discussed in the past that for center is not indicated as a monotherapy, so we did add on Arnuity, which she is only using during respiratory flares.  She has not needed to add this in several months.  Plan/Recommendations:   1. Chronic nonseasonal allergic rhinitis - Continue with fluticasone two sprays per nostril but decrease to once daily. - Continue with cetirizine (Zyrtec).    2. Moderate persistent asthma, uncomplicated - Lung testing looked normal. - We will change your Arnuity one puff daily during flares - Daily controller medication(s): Fasenra every 8 weeks - Prior to physical activity: albuterol 2 puffs 10-15 minutes before physical activity. - Rescue medications: albuterol 4 puffs every 4-6 hours as needed -  Changes during respiratory infections or worsening symptoms: Add on Arnuity 20mcg 1 puff once daily for TWO WEEKS. - Asthma control goals:  * Full participation in all desired activities (may need albuterol before activity) * Albuterol use two time or less a week on average (not counting use with activity) * Cough interfering with sleep two time or less a month * Oral steroids no more than once a year * No hospitalizations  3. Acute sinusitis - Start the prednisone pack provided today. - Fill the antibiotic if the symptoms do not improve over the weekend. - We are going to look at your immune system to make sure that it is working properly. - We will obtain some screening labs to evaluate your immune system.  - Labs to evaluate the quantitative [facility] aspects of your immune system: IgG/IgA/IgM, CBC with differential - Labs to evaluate the qualitative (HOW WELL THEY WORK) aspects of your immune system: CH50, Pneumococcal titers, Tetanus titers, Diphtheria titers - We may consider immunizations with Pneumovax and Tdap to challenge your immune system, and then obtain repeat titers in 4-6 weeks.   4. Return in about 6 months (around 06/13/2019).   Subjective:   Betty Golden is a 73 y.o. female presenting today for follow up of  Chief Complaint  Patient presents with  . Asthma  . Sinusitis    Betty PettyDoreen J Golden has a history of the following: Patient Active Problem List   Diagnosis Date Noted  . Gastritis and gastroduodenitis 07/08/2018  . Rectal bleeding 06/29/2018  . Fibromyalgia 09/08/2017  .  Spinal stenosis of lumbar region 09/20/2015  . 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 Moderate persistent asthma 07/03/2015  . Allergic rhinitis 07/03/2015  . Colitis due to Clostridium difficile 02/10/2015  . Abdominal pain 02/10/2015  . Nausea and vomiting 02/10/2015  . Acute renal failure (HCC) 02/10/2015  . Dehydration 02/10/2015  . Hypoxia 02/10/2015  . Hyponatremia 02/10/2015  .  Hypokalemia 02/10/2015  . COPD (chronic obstructive pulmonary disease) (HCC)   . Unspecified constipation 07/15/2013  . DYSPNEA 04/05/2008  . Essential hypertension 03/10/2008  . Headache(784.0) 03/10/2008  . Cough 03/10/2008    History obtained from: chart review and patient.  Rheuamtologist - Dr. Casimer Lanius at [facility]  Betty Golden is a 73 y.o. female presenting for a sick visit.  She was last seen in March 2020.  At that time, we treated her for sinus infection with Augmentin and prednisone.  Her asthma was well controlled with a center every 8 weeks and Arnuity 200 mcg 1 puff daily during flares.  For her allergic rhinitis, we continued Flonase and Zyrtec.  Since the last visit, she has reports that she has done well. She tells me that her symptoms started gradually last week and her symptoms worsened over the course of the week or so. She does report that she has had pain with head movement. She has not had a fever. She remains on her Flonase, which she is doing every day especially now during this season. She had not started Mucinex. She does do salt water rinses.   She did feel better after March and she remained well through April. But then her symptoms returned around one week ago. She has not seen an otolaryngologist in several years since she received steroid injections at [facility].  She does not get antibiotics for any other kind of infections.  Review of her chart shows that she was treated for a sinus infection in March 2020, September 2019 x2, October 2018, and December 2017. She does not really go anywhere else for antibiotics including urgent care. Her PCP recently left and she is transitioning to someone else.   Asthma remains well controlled with a center every 8 weeks.  She does have Arnuity that she adds during respiratory flares and albuterol to use as needed.  She has not needed prednisone for breathing purposes in over a year.  Of note, she has a  diagnosis of RA. She is on Humira to see if this would help. This was diagnosed at the end of 2018 and into 2019. She started the Humira in March 2020. She takes this every two weeks at home. She is unsure whether it is helping at this time. She does go to see a chiropractor every two weeks. She is not on any other pain medications aside from the Humira. She cannot take ibuprofen because of some GI issues.   Otherwise, there have been no changes to her past medical history, surgical history, family history, or social history.    Review of Systems  Constitutional: Negative.  Negative for fever, malaise/fatigue and weight loss.  HENT: Positive for congestion, sinus pain and sore throat. Negative for ear discharge and ear pain.   Eyes: Negative for pain, discharge and redness.  Respiratory: Negative for cough, sputum production, shortness of breath and wheezing.   Cardiovascular: Negative.  Negative for chest pain and palpitations.  Gastrointestinal: Negative for abdominal pain and heartburn.  Skin: Negative.  Negative for itching and rash.  Neurological: Negative for dizziness and headaches.  Endo/Heme/Allergies:  Negative for environmental allergies. Does not bruise/bleed easily.       Objective:   Blood pressure 138/78, pulse 91, resp. rate 16, SpO2 97 %. There is no height or weight on file to calculate BMI.   Physical Exam:  Physical Exam  Constitutional: She appears well-developed.  Pleasant.  Wearing a mask.  HENT:  Head: Normocephalic and atraumatic.  Right Ear: Tympanic membrane, external ear and ear canal normal.  Left Ear: Tympanic membrane, external ear and ear canal normal.  Nose: Mucosal edema present. No rhinorrhea, nasal deformity or septal deviation. No epistaxis. Right sinus exhibits maxillary sinus tenderness and frontal sinus tenderness. Left sinus exhibits maxillary sinus tenderness and frontal sinus tenderness.  Mouth/Throat: Uvula is midline and oropharynx is  clear and moist. Mucous membranes are not pale and not dry.  No nasal polyps.  Minimal nasal discharge.  She does have sinus tenderness over the bilateral maxillary sinuses as well as the frontal sinuses.  Eyes: Pupils are equal, round, and reactive to light. Conjunctivae and EOM are normal. Right eye exhibits no chemosis and no discharge. Left eye exhibits no chemosis and no discharge. Right conjunctiva is not injected. Left conjunctiva is not injected.  Cardiovascular: Normal rate, regular rhythm and normal heart sounds.  Respiratory: Effort normal and breath sounds normal. No accessory muscle usage. No tachypnea. No respiratory distress. She has no wheezes. She has no rhonchi. She has no rales. She exhibits no tenderness.  Moving air well in all lung fields.  Lymphadenopathy:    She has no cervical adenopathy.  Neurological: She is alert.  Skin: No abrasion, no petechiae and no rash noted. Rash is not papular, not vesicular and not urticarial. No erythema. No pallor.  Psychiatric: She has a normal mood and affect.     Diagnostic studies: none       Malachi BondsJoel Tyrisha Benninger, MD  [facility]

## 2018-12-11 NOTE — Patient Instructions (Addendum)
1. Chronic nonseasonal allergic rhinitis - Continue with fluticasone two sprays per nostril but decrease to once daily. - Continue with cetirizine (Zyrtec).    2. Moderate persistent asthma, uncomplicated - Lung testing looked normal. - We will change your Arnuity one puff daily during flares - Daily controller medication(s): Fasenra every 8 weeks - Prior to physical activity: albuterol 2 puffs 10-15 minutes before physical activity. - Rescue medications: albuterol 4 puffs every 4-6 hours as needed - Changes during respiratory infections or worsening symptoms: Add on Arnuity 1 puff once daily for TWO WEEKS. - Asthma control goals:  * Full participation in all desired activities (may need albuterol before activity) * Albuterol use two time or less a week on average (not counting use with activity) * Cough interfering with sleep two time or less a month * Oral steroids no more than once a year * No hospitalizations  3. Acute sinusitis - Start the prednisone pack provided today. - Fill the antibiotic if the symptoms do not improve over the weekend. - We are going to look at your immune system to make sure that it is working properly. - We will obtain some screening labs to evaluate your immune system.  - Labs to evaluate the quantitative [facility]) aspects of your immune system: IgG/IgA/IgM, CBC with differential - Labs to evaluate the qualitative (HOW WELL THEY WORK) aspects of your immune system: CH50, Pneumococcal titers, Tetanus titers, Diphtheria titers - We may consider immunizations with Pneumovax and Tdap to challenge your immune system, and then obtain repeat titers in 4-6 weeks.   4. Return in about 6 months (around 06/13/2019).   Please inform us of any Emergency Department visits, hospitalizations, or changes in symptoms. Call us before going to the ED for breathing or allergy symptoms since we might be able to fit you in for a sick visit. Feel free to contact us anytime  with any questions, problems, or concerns.  It was a pleasure to see you again today!  Websites that have reliable patient information: 1. American Academy of Asthma, Allergy, and Immunology: www.aaaai.org 2. Food Allergy Research and Education (FARE): foodallergy.org 3. Mothers of Asthmatics: http://www.asthmacommunitynetwork.org 4. American College of Allergy, Asthma, and Immunology: MissingWeapons.ca   Make sure you are registered to vote! If you have moved or changed any of your contact information, you will need to get this updated before voting!     ]

## 2018-12-18 LAB — STREP PNEUMONIAE 23 SEROTYPES IGG
Pneumo Ab Type 1*: 3 ug/mL (ref >1.3)
Pneumo Ab Type 12 (12F)*: 0.5 ug/mL — ABNORMAL LOW (ref >1.3)
Pneumo Ab Type 14*: 13.5 ug/mL (ref >1.3)
Pneumo Ab Type 17 (17F)*: 1 ug/mL — ABNORMAL LOW (ref >1.3)
Pneumo Ab Type 19 (19F)*: 9 ug/mL (ref >1.3)
Pneumo Ab Type 2*: 8.6 ug/mL (ref >1.3)
Pneumo Ab Type 20*: 3.4 ug/mL (ref >1.3)
Pneumo Ab Type 22 (22F)*: 1.5 ug/mL (ref >1.3)
Pneumo Ab Type 23 (23F)*: 2.7 ug/mL (ref >1.3)
Pneumo Ab Type 26 (6B)*: 16.1 ug/mL (ref >1.3)
Pneumo Ab Type 3*: 9.3 ug/mL (ref >1.3)
Pneumo Ab Type 34 (10A)*: 1.1 ug/mL — ABNORMAL LOW (ref >1.3)
Pneumo Ab Type 4*: 1 ug/mL — ABNORMAL LOW (ref >1.3)
Pneumo Ab Type 43 (11A)*: 0.6 ug/mL — ABNORMAL LOW (ref >1.3)
Pneumo Ab Type 5*: 3.5 ug/mL (ref >1.3)
Pneumo Ab Type 51 (7F)*: 2.7 ug/mL (ref >1.3)
Pneumo Ab Type 54 (15B)*: 3.7 ug/mL (ref >1.3)
Pneumo Ab Type 56 (18C)*: 9.3 ug/mL (ref >1.3)
Pneumo Ab Type 57 (19A)*: 10.4 ug/mL (ref >1.3)
Pneumo Ab Type 68 (9V)*: 10.3 ug/mL (ref >1.3)
Pneumo Ab Type 70 (33F)*: 1.6 ug/mL (ref >1.3)
Pneumo Ab Type 8*: 0.9 ug/mL — ABNORMAL LOW (ref >1.3)
Pneumo Ab Type 9 (9N)*: 3.4 ug/mL (ref >1.3)

## 2018-12-18 LAB — DIPHTHERIA / TETANUS ANTIBODY PANEL
Diphtheria Ab: 0.23 IU/mL (ref <0.10)
Tetanus Ab, IgG: 1.47 IU/mL (ref <0.10)

## 2018-12-18 LAB — COMPLEMENT, TOTAL: Compl, Total (CH50): >60 U/mL (ref >41)

## 2018-12-18 LAB — IGG, IGA, IGM
IgA/Immunoglobulin A, Serum: 207 mg/dL (ref 64–422)
IgG (Immunoglobin G), Serum: 1642 mg/dL — ABNORMAL HIGH (ref 586–1602)
IgM (Immunoglobulin M), Srm: 123 mg/dL (ref 26–217)

## 2018-12-29 ENCOUNTER — Ambulatory Visit: Payer: Medicare Other | Admitting: General Surgery

## 2018-12-29 ENCOUNTER — Encounter: Payer: Self-pay | Admitting: *Deleted

## 2019-01-12 ENCOUNTER — Encounter (INDEPENDENT_AMBULATORY_CARE_PROVIDER_SITE_OTHER): Payer: Self-pay

## 2019-01-12 ENCOUNTER — Other Ambulatory Visit: Payer: Self-pay

## 2019-01-12 ENCOUNTER — Encounter: Payer: Self-pay | Admitting: General Surgery

## 2019-01-12 ENCOUNTER — Ambulatory Visit: Payer: Medicare Other | Admitting: General Surgery

## 2019-01-12 VITALS — BP 156/87 | HR 77 | Temp 96.8°F | Resp 16 | Ht 65.0 in | Wt 166.0 lb

## 2019-01-12 DIAGNOSIS — K640 First degree hemorrhoids: Secondary | ICD-10-CM

## 2019-01-12 NOTE — Progress Notes (Signed)
Betty Golden; [id number]; November 16, 1945   HPI Patient is a 73 year old female who was referred to my care by Deberah Castle for evaluation treatment of hemorrhoidal disease.  Patient had a colonoscopy in January 2020 and was noted to have grade 1 hemorrhoids.  She underwent a virtual visit in May of this year due to rectal bleeding.  She noticed some blood in the toilet paper in the toilet bowel after having a hard bowel movement.  She has been putting hydrocortisone cream on the hemorrhoid which has resolved the bleeding.  She is not currently using the cream and only uses it when she has bleeding.  She states this has been intermittent for approximately 1 year, but over the past few weeks, she has not had any episodes of bleeding.  She has 0 out of 10 rectal pain.  She has never had hemorrhoid surgery. Past Medical History:  Diagnosis Date  . Arthritis   . Asthma   . C. difficile colitis   . Cervical stenosis of spine   . Chronic headaches   . Chronic neck pain   . Depression   . Diverticulosis   . Essential hypertension   . Fibromyalgia   . GERD (gastroesophageal reflux disease)   . History of Salmonella gastroenteritis   . HNP (herniated nucleus pulposus), lumbar   . Hyperlipidemia   . IBS (irritable bowel syndrome)   . Lung nodule     Past Surgical History:  Procedure Laterality Date  . APPENDECTOMY  1965  . BIOPSY  08/26/2018   Procedure: BIOPSY;  Surgeon: Rogene Houston, MD;  [facility];  Service: Endoscopy;;  gastric  . BREAST BIOPSY    . BREAST REDUCTION SURGERY  1994  . CHOLECYSTECTOMY  1975  . COLONOSCOPY N/A 08/19/2013   Procedure: COLONOSCOPY;  Surgeon: Rogene Houston, MD;  [facility];  Service: Endoscopy;  Laterality: N/A;  155-moved to 140 Ann to notify pt  . COLONOSCOPY N/A 08/26/2018   Procedure: COLONOSCOPY;  Surgeon: Rogene Houston, MD;  [facility];  Service: Endoscopy;  Laterality: N/A;  . ESOPHAGOGASTRODUODENOSCOPY N/A  08/26/2018   Procedure: ESOPHAGOGASTRODUODENOSCOPY (EGD);  Surgeon: Rogene Houston, MD;  [facility];  Service: Endoscopy;  Laterality: N/A;  . fibromyalgia    . LUMBAR LAMINECTOMY/DECOMPRESSION MICRODISCECTOMY Bilateral 09/20/2015   Procedure: MICRO LUMBAR DECOMPRESSION L5-S1 BILATERAL    (1 LEVEL);  Surgeon: Susa Day, MD;  Location: [facility];  Service: Orthopedics;  Laterality: Bilateral;  . rheumatoid arthritis    . Sinus Surgery    . TONSILLECTOMY  1968    Family History  Problem Relation Age of Onset  . Heart disease Mother   . Hypertension Mother   . Asthma Mother   . Heart disease Father   . Stroke Father   . Hypertension Father   . Stroke Sister   . Asthma Sister   . Hypertension Brother   . Asthma Brother   . Asthma Maternal Grandmother   . Heart disease Maternal Grandmother   . Diabetes Maternal Grandfather   . Asthma Maternal Aunt   . Colon cancer Neg Hx     Current Outpatient Medications on File Prior to Visit  Medication Sig Dispense Refill  . albuterol (PROAIR HFA) 108 (90 Base) MCG/ACT inhaler Inhale 2 puffs into the lungs every 4 (four) hours as needed for wheezing or shortness of breath. 1 Inhaler 1  . DULoxetine (CYMBALTA) 20 MG capsule Take 20 mg by mouth  2 (two) times daily.    . Fluticasone Furoate (ARNUITY ELLIPTA) 100 MCG/ACT AEPB Inhale 1 Dose into the lungs daily. 30 each 5  . gabapentin (NEURONTIN) 300 MG capsule Take 300 mg by mouth 3 (three) times daily.  3  . HUMIRA 40 MG/0.4ML PSKT     . hydrocortisone (ANUSOL-HC) 2.5 % rectal cream Place rectally 2 (two) times daily. 30 g 1  . losartan-hydrochlorothiazide (HYZAAR) 100-12.5 MG per tablet Take 1 tablet by mouth daily.    . simvastatin (ZOCOR) 20 MG tablet Take 20 mg by mouth at bedtime.     . Wheat Dextrin (BENEFIBER DRINK MIX) PACK Take 4 g by mouth at bedtime.     Current Facility-Administered Medications on File Prior to Visit  Medication Dose Route Frequency Provider Last Rate  Last Dose  . Benralizumab SOSY 30 mg  30 mg Subcutaneous Q8 Thomes Dinning, MD   30 mg at 12/11/18 0920    Allergies  Allergen Reactions  . Sulfa Antibiotics Rash    Social History   Substance and Sexual Activity  Alcohol Use No   Comment: stopped in 1990    Social History   Tobacco Use  Smoking Status Former Smoker  . Packs/day: 2.00  . Years: 10.00  . Pack years: 20.00  . Types: Cigarettes  . Quit date: 09/11/1988  . Years since quitting: 30.3  Smokeless Tobacco Never Used  Tobacco Comment   quit 1990    Review of Systems  Constitutional: Positive for malaise/fatigue.  HENT: Positive for sinus pain.   Eyes: Positive for pain.  Respiratory: Positive for cough, shortness of breath and wheezing.   Cardiovascular: Negative.   Gastrointestinal: Positive for heartburn.  Genitourinary: Positive for frequency.  Musculoskeletal: Positive for back pain, joint pain and neck pain.  Skin: Negative.   Neurological: Negative.   Endo/Heme/Allergies: Negative.   Psychiatric/Behavioral: Negative.     Objective   Vitals:   01/12/19 1022  BP: (!) 156/87  Pulse: 77  Resp: 16  Temp: (!) 96.8 F (36 C)  SpO2: 98%    Physical Exam Vitals signs reviewed.  Constitutional:      Appearance: Normal appearance. She is not ill-appearing.  HENT:     Head: Normocephalic and atraumatic.  Cardiovascular:     Rate and Rhythm: Normal rate and regular rhythm.     Heart sounds: Normal heart sounds. No murmur. No friction rub. No gallop.   Pulmonary:     Effort: Pulmonary effort is normal. No respiratory distress.     Breath sounds: Normal breath sounds. No stridor. No wheezing, rhonchi or rales.  Genitourinary:    Comments: Rectal examination reveals a grade 1 hemorrhoid with healed granulation tissue on its surface at the 4 o'clock position.  No thrombosis is present.  No active bleeding is noted.  Normal sphincter tone is present. Skin:    General: Skin is warm and  dry.  Neurological:     Mental Status: She is alert and oriented to person, place, and time.    GI notes reviewed Assessment  Grade 1 hemorrhoidal disease, not symptomatic at this time Plan   As patient's symptoms have significantly improved, I would not proceed with hemorrhoidectomy at this time.  Patient is fine with that.  She was told to call me should she develop recurrent rectal bleeding as I would like to see the hemorrhoid at that stage.  Literature was given concerning prevention of symptomatic hemorrhoidal disease.  Follow-up here as needed.

## 2019-01-12 NOTE — Patient Instructions (Signed)

## 2019-02-05 ENCOUNTER — Ambulatory Visit: Payer: Medicare Other

## 2019-02-26 ENCOUNTER — Ambulatory Visit (INDEPENDENT_AMBULATORY_CARE_PROVIDER_SITE_OTHER): Payer: Medicare Other

## 2019-02-26 ENCOUNTER — Ambulatory Visit: Payer: Medicare Other

## 2019-02-26 ENCOUNTER — Other Ambulatory Visit: Payer: Self-pay

## 2019-02-26 DIAGNOSIS — J4550 Severe persistent asthma, uncomplicated: Secondary | ICD-10-CM | POA: Diagnosis not present

## 2019-04-03 ENCOUNTER — Inpatient Hospital Stay (HOSPITAL_COMMUNITY)
Admission: EM | Admit: 2019-04-03 | Discharge: 2019-04-06 | DRG: 177 | Disposition: A | Discharge status: Home / Self Care | Payer: Medicare Other | Attending: Internal Medicine | Admitting: Internal Medicine

## 2019-04-03 ENCOUNTER — Emergency Department (HOSPITAL_COMMUNITY): Payer: Medicare Other

## 2019-04-03 ENCOUNTER — Other Ambulatory Visit: Payer: Self-pay

## 2019-04-03 ENCOUNTER — Encounter (HOSPITAL_COMMUNITY): Payer: Self-pay | Admitting: Emergency Medicine

## 2019-04-03 DIAGNOSIS — Z823 Family history of stroke: Secondary | ICD-10-CM

## 2019-04-03 DIAGNOSIS — R0603 Acute respiratory distress: Secondary | ICD-10-CM | POA: Diagnosis present

## 2019-04-03 DIAGNOSIS — Z882 Allergy status to sulfonamides status: Secondary | ICD-10-CM

## 2019-04-03 DIAGNOSIS — E860 Dehydration: Secondary | ICD-10-CM | POA: Diagnosis present

## 2019-04-03 DIAGNOSIS — I10 Essential (primary) hypertension: Secondary | ICD-10-CM | POA: Diagnosis present

## 2019-04-03 DIAGNOSIS — K589 Irritable bowel syndrome without diarrhea: Secondary | ICD-10-CM | POA: Diagnosis present

## 2019-04-03 DIAGNOSIS — R0600 Dyspnea, unspecified: Secondary | ICD-10-CM | POA: Diagnosis present

## 2019-04-03 DIAGNOSIS — U071 COVID-19: Secondary | ICD-10-CM | POA: Diagnosis not present

## 2019-04-03 DIAGNOSIS — F329 Major depressive disorder, single episode, unspecified: Secondary | ICD-10-CM | POA: Diagnosis present

## 2019-04-03 DIAGNOSIS — M797 Fibromyalgia: Secondary | ICD-10-CM | POA: Diagnosis present

## 2019-04-03 DIAGNOSIS — Z87891 Personal history of nicotine dependence: Secondary | ICD-10-CM

## 2019-04-03 DIAGNOSIS — J440 Chronic obstructive pulmonary disease with acute lower respiratory infection: Secondary | ICD-10-CM | POA: Diagnosis present

## 2019-04-03 DIAGNOSIS — Z7952 Long term (current) use of systemic steroids: Secondary | ICD-10-CM

## 2019-04-03 DIAGNOSIS — R0602 Shortness of breath: Secondary | ICD-10-CM

## 2019-04-03 DIAGNOSIS — K219 Gastro-esophageal reflux disease without esophagitis: Secondary | ICD-10-CM | POA: Diagnosis present

## 2019-04-03 DIAGNOSIS — J4540 Moderate persistent asthma, uncomplicated: Secondary | ICD-10-CM | POA: Diagnosis present

## 2019-04-03 DIAGNOSIS — M069 Rheumatoid arthritis, unspecified: Secondary | ICD-10-CM | POA: Diagnosis present

## 2019-04-03 DIAGNOSIS — Z7951 Long term (current) use of inhaled steroids: Secondary | ICD-10-CM

## 2019-04-03 DIAGNOSIS — Z79899 Other long term (current) drug therapy: Secondary | ICD-10-CM

## 2019-04-03 DIAGNOSIS — Z8619 Personal history of other infectious and parasitic diseases: Secondary | ICD-10-CM

## 2019-04-03 DIAGNOSIS — Z825 Family history of asthma and other chronic lower respiratory diseases: Secondary | ICD-10-CM

## 2019-04-03 DIAGNOSIS — E876 Hypokalemia: Secondary | ICD-10-CM | POA: Diagnosis present

## 2019-04-03 DIAGNOSIS — Z833 Family history of diabetes mellitus: Secondary | ICD-10-CM

## 2019-04-03 DIAGNOSIS — Z8249 Family history of ischemic heart disease and other diseases of the circulatory system: Secondary | ICD-10-CM

## 2019-04-03 DIAGNOSIS — J1289 Other viral pneumonia: Secondary | ICD-10-CM | POA: Diagnosis present

## 2019-04-03 DIAGNOSIS — A0839 Other viral enteritis: Secondary | ICD-10-CM | POA: Diagnosis present

## 2019-04-03 DIAGNOSIS — E785 Hyperlipidemia, unspecified: Secondary | ICD-10-CM | POA: Diagnosis present

## 2019-04-03 HISTORY — DX: Atherosclerotic heart disease of native coronary artery without angina pectoris: I25.10

## 2019-04-03 LAB — COMPREHENSIVE METABOLIC PANEL
ALT: 20 U/L (ref 0–44)
AST: 23 U/L (ref 15–41)
Albumin: 4.1 g/dL (ref 3.5–5.0)
Alkaline Phosphatase: 55 U/L (ref 38–126)
Anion gap: 11 (ref 5–15)
BUN: 14 mg/dL (ref 8–23)
CO2: 27 mmol/L (ref 22–32)
Calcium: 9.3 mg/dL (ref 8.9–10.3)
Chloride: 102 mmol/L (ref 98–111)
Creatinine, Ser: 1.01 mg/dL — ABNORMAL HIGH (ref 0.44–1.00)
GFR calc Af Amer: >60 mL/min (ref >60)
GFR calc non Af Amer: 56 mL/min — ABNORMAL LOW (ref >60)
Glucose, Bld: 129 mg/dL — ABNORMAL HIGH (ref 70–99)
Potassium: 3.4 mmol/L — ABNORMAL LOW (ref 3.5–5.1)
Sodium: 140 mmol/L (ref 135–145)
Total Bilirubin: 0.8 mg/dL (ref 0.3–1.2)
Total Protein: 7.7 g/dL (ref 6.5–8.1)

## 2019-04-03 LAB — CBC WITH DIFFERENTIAL/PLATELET
Abs Immature Granulocytes: 0.03 10*3/uL (ref 0.00–0.07)
Basophils Absolute: 0 10*3/uL (ref 0.0–0.1)
Basophils Relative: 0 %
Eosinophils Absolute: 0 10*3/uL (ref 0.0–0.5)
Eosinophils Relative: 0 %
HCT: 41.6 % (ref 36.0–46.0)
Hemoglobin: 13 g/dL (ref 12.0–15.0)
Immature Granulocytes: 0 %
Lymphocytes Relative: 50 %
Lymphs Abs: 5.8 10*3/uL — ABNORMAL HIGH (ref 0.7–4.0)
MCH: 28.6 pg (ref 26.0–34.0)
MCHC: 31.3 g/dL (ref 30.0–36.0)
MCV: 91.6 fL (ref 80.0–100.0)
Monocytes Absolute: 1.1 10*3/uL — ABNORMAL HIGH (ref 0.1–1.0)
Monocytes Relative: 9 %
Neutro Abs: 4.9 10*3/uL (ref 1.7–7.7)
Neutrophils Relative %: 41 %
Platelets: 327 10*3/uL (ref 150–400)
RBC: 4.54 MIL/uL (ref 3.87–5.11)
RDW: 15.1 % (ref 11.5–15.5)
WBC: 11.7 10*3/uL — ABNORMAL HIGH (ref 4.0–10.5)
nRBC: 0 % (ref 0.0–0.2)

## 2019-04-03 LAB — TROPONIN I (HIGH SENSITIVITY): Troponin I (High Sensitivity): 6 ng/L (ref <18)

## 2019-04-03 LAB — BRAIN NATRIURETIC PEPTIDE: B Natriuretic Peptide: 28 pg/mL (ref 0.0–100.0)

## 2019-04-03 LAB — D-DIMER, QUANTITATIVE: D-Dimer, Quant: <0.27 ug/mL-FEU (ref 0.00–0.50)

## 2019-04-03 MED ORDER — ALBUTEROL SULFATE HFA 108 (90 BASE) MCG/ACT IN AERS
INHALATION_SPRAY | RESPIRATORY_TRACT | Status: AC
  Administered 2019-04-03: 23:00:00 via RESPIRATORY_TRACT
  Filled 2019-04-03: qty 6.7

## 2019-04-03 MED ORDER — METHYLPREDNISOLONE SODIUM SUCC 125 MG IJ SOLR
80.0000 mg | Freq: Once | INTRAMUSCULAR | Status: AC
  Administered 2019-04-03: 80 mg via INTRAVENOUS
  Filled 2019-04-03: qty 2

## 2019-04-03 MED ORDER — MAGNESIUM SULFATE 2 GM/50ML IV SOLN
2.0000 g | Freq: Once | INTRAVENOUS | Status: AC
  Administered 2019-04-03: 2 g via INTRAVENOUS
  Filled 2019-04-03: qty 50

## 2019-04-03 MED ORDER — ALBUTEROL SULFATE HFA 108 (90 BASE) MCG/ACT IN AERS
6.0000 | INHALATION_SPRAY | Freq: Once | RESPIRATORY_TRACT | Status: AC
  Administered 2019-04-03: 23:00:00 via RESPIRATORY_TRACT
  Administered 2019-04-03: 23:00:00 6 via RESPIRATORY_TRACT

## 2019-04-03 NOTE — ED Provider Notes (Signed)
[facility] EMERGENCY DEPARTMENT Provider Note   CSN: [id number] Arrival date & time: 04/03/19  2227     History   Chief Complaint Chief Complaint  Patient presents with  . Shortness of Breath    HPI Betty Golden is a 73 y.o. female.     Level 5 caveat for respiratory distress.  Patient with history of COPD, asthma, previous smoker, IBS, fibromyalgia, rheumatoid arthritis presenting with shortness of breath since last night.  She cannot keep her breathing under control at home.  She been using her inhaler albuterol without relief.  She states compliance with her fluticasone.  Used her inhaler 3 times a day and her family members nebulizer 1 time.  States she is had a cough productive of yellow mucus and some vomiting of mucus. No fever. No leg pain or leg swelling. Some chest and abdominal pain with coughing. No known Covid exposures.   The history is provided by the patient. The history is limited by the condition of the patient.  Shortness of Breath Associated symptoms: chest pain, cough, sore throat and vomiting   Associated symptoms: no abdominal pain, no fever and no headaches     Past Medical History:  Diagnosis Date  . Arthritis   . Asthma   . C. difficile colitis   . Cervical stenosis of spine   . Chronic headaches   . Chronic neck pain   . Depression   . Diverticulosis   . Essential hypertension   . Fibromyalgia   . GERD (gastroesophageal reflux disease)   . History of Salmonella gastroenteritis   . HNP (herniated nucleus pulposus), lumbar   . Hyperlipidemia   . IBS (irritable bowel syndrome)   . Lung nodule     Patient Active Problem List   Diagnosis Date Noted  . Gastritis and gastroduodenitis 07/08/2018  . Rectal bleeding 06/29/2018  . Fibromyalgia 09/08/2017  . Spinal stenosis of lumbar region 09/20/2015  . 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 Moderate persistent asthma 07/03/2015  . Allergic rhinitis 07/03/2015  . Colitis due to Clostridium difficile  02/10/2015  . Abdominal pain 02/10/2015  . Nausea and vomiting 02/10/2015  . Acute renal failure ([place]) 02/10/2015  . Dehydration 02/10/2015  . Hypoxia 02/10/2015  . Hyponatremia 02/10/2015  . Hypokalemia 02/10/2015  . COPD (chronic obstructive pulmonary disease) ([place])   . Unspecified constipation 07/15/2013  . DYSPNEA 04/05/2008  . Essential hypertension 03/10/2008  . Headache(784.0) 03/10/2008  . Cough 03/10/2008    Past Surgical History:  Procedure Laterality Date  . APPENDECTOMY  1965  . BIOPSY  08/26/2018   Procedure: BIOPSY;  Surgeon: Rogene Houston, MD;  [facility];  Service: Endoscopy;;  gastric  . BREAST BIOPSY    . BREAST REDUCTION SURGERY  1994  . CHOLECYSTECTOMY  1975  . COLONOSCOPY N/A 08/19/2013   Procedure: COLONOSCOPY;  Surgeon: Rogene Houston, MD;  [facility]: [facility];  Service: Endoscopy;  Laterality: N/A;  155-moved to 140 Ann to notify pt  . COLONOSCOPY N/A 08/26/2018   Procedure: COLONOSCOPY;  Surgeon: Rogene Houston, MD;  [facility]: [facility];  Service: Endoscopy;  Laterality: N/A;  . ESOPHAGOGASTRODUODENOSCOPY N/A 08/26/2018   Procedure: ESOPHAGOGASTRODUODENOSCOPY (EGD);  Surgeon: Rogene Houston, MD;  [facility];  Service: Endoscopy;  Laterality: N/A;  . fibromyalgia    . LUMBAR LAMINECTOMY/DECOMPRESSION MICRODISCECTOMY Bilateral 09/20/2015   Procedure: MICRO LUMBAR DECOMPRESSION L5-S1 BILATERAL    (1 LEVEL);  Surgeon: Susa Day, MD;  Location: [facility];  Service: Orthopedics;  Laterality: Bilateral;  . rheumatoid arthritis    . Sinus Surgery    . TONSILLECTOMY  1968     OB History   No obstetric history on file.      Home Medications    Prior to Admission medications   Medication Sig Start Date End Date Taking? Authorizing Provider  albuterol (PROAIR HFA) 108 (90 Base) MCG/ACT inhaler Inhale 2 puffs into the lungs every 4 (four) hours as needed for wheezing or shortness of breath. 10/07/18   Alfonse SpruceGallagher, Joel  Louis, MD  DULoxetine (CYMBALTA) 20 MG capsule Take 20 mg by mouth 2 (two) times daily.    [provider]  Fluticasone Furoate (ARNUITY ELLIPTA) 100 MCG/ACT AEPB Inhale 1 Dose into the lungs daily. 10/07/18   Alfonse SpruceGallagher, Joel Louis, MD  gabapentin (NEURONTIN) 300 MG capsule Take 300 mg by mouth 3 (three) times daily. 06/24/18   [provider]  HUMIRA 40 MG/0.4ML PSKT  09/18/18   [provider]  hydrocortisone (ANUSOL-HC) 2.5 % rectal cream Place rectally 2 (two) times daily. 12/01/18   Setzer, Brand Maleserri L, NP  losartan-hydrochlorothiazide (HYZAAR) 100-12.5 MG per tablet Take 1 tablet by mouth daily.    [provider]  simvastatin (ZOCOR) 20 MG tablet Take 20 mg by mouth at bedtime.     [provider]  Wheat Dextrin (BENEFIBER DRINK MIX) PACK Take 4 g by mouth at bedtime. 08/26/18   Malissa Hippoehman, Najeeb U, MD    Family History Family History  Problem Relation Age of Onset  . Heart disease Mother   . Hypertension Mother   . Asthma Mother   . Heart disease Father   . Stroke Father   . Hypertension Father   . Stroke Sister   . Asthma Sister   . Hypertension Brother   . Asthma Brother   . Asthma Maternal Grandmother   . Heart disease Maternal Grandmother   . Diabetes Maternal Grandfather   . Asthma Maternal Aunt   . Colon cancer Neg Hx     Social History Social History   Tobacco Use  . Smoking status: Former Smoker    Packs/day: 2.00    Years: 10.00    Pack years: 20.00    Types: Cigarettes    Quit date: 09/11/1988    Years since quitting: 30.5  . Smokeless tobacco: Never Used  . Tobacco comment: quit 1990  Substance Use Topics  . Alcohol use: No    Comment: stopped in 1990  . Drug use: No    Comment: hx smoking marijuana and cocaine weekends. Stopped in the 1990s.     Allergies   Sulfa antibiotics   Review of Systems Review of Systems  Constitutional: Positive for fatigue. Negative for fever.  HENT: Positive for congestion,  rhinorrhea and sore throat.   Respiratory: Positive for cough, chest tightness and shortness of breath.   Cardiovascular: Positive for chest pain.  Gastrointestinal: Positive for nausea and vomiting. Negative for abdominal pain.  Genitourinary: Negative for dysuria and hematuria.  Musculoskeletal: Negative for arthralgias and myalgias.  Neurological: Negative for dizziness, weakness and headaches.   all other systems are negative except as noted in the HPI and PMH.     Physical Exam Updated Vital Signs BP (!) 142/75 (BP Location: Left Arm)   Pulse 97   Temp 98.1 F (36.7 C) (Oral)   Resp (!) 40   Ht 5\' 3"  (1.6 m)   Wt 74.8 kg   SpO2 100%  BMI 29.23 kg/m   Physical Exam Vitals signs and nursing note reviewed.  Constitutional:      General: She is in acute distress.     Appearance: Normal appearance. She is well-developed and normal weight. She is not toxic-appearing or diaphoretic.     Comments: Moderate respiratory distress speaking in short sentences Tachypnea to 40s.   HENT:     Head: Normocephalic and atraumatic.     Nose: Nose normal. No congestion or rhinorrhea.     Mouth/Throat:     Mouth: Mucous membranes are moist.     Pharynx: No oropharyngeal exudate.  Eyes:     Conjunctiva/sclera: Conjunctivae normal.     Pupils: Pupils are equal, round, and reactive to light.  Neck:     Musculoskeletal: Normal range of motion and neck supple.     Comments: No meningismus. Cardiovascular:     Rate and Rhythm: Normal rate and regular rhythm.     Heart sounds: Normal heart sounds. No murmur.  Pulmonary:     Effort: Respiratory distress present.     Breath sounds: No wheezing.     Comments: Tachypneic, speaking short phrases, accessory muscle use, diminished breath sounds without wheezing Chest:     Chest wall: No tenderness.  Abdominal:     Palpations: Abdomen is soft.     Tenderness: There is no abdominal tenderness. There is no guarding or rebound.  Musculoskeletal:  Normal range of motion.        General: No tenderness.     Right lower leg: No edema.     Left lower leg: No edema.  Skin:    General: Skin is warm.     Capillary Refill: Capillary refill takes less than 2 seconds.  Neurological:     General: No focal deficit present.     Mental Status: She is alert and oriented to person, place, and time. Mental status is at baseline.     Cranial Nerves: No cranial nerve deficit.     Motor: No abnormal muscle tone.     Coordination: Coordination normal.     Comments: No ataxia on finger to nose bilaterally. No pronator drift. 5/5 strength throughout. CN 2-12 intact.Equal grip strength. Sensation intact.   Psychiatric:        Behavior: Behavior normal.      ED Treatments / Results  Labs (all labs ordered are listed, but only abnormal results are displayed) Labs Reviewed  SARS CORONAVIRUS 2 (HOSPITAL ORDER, PERFORMED IN [facility]) - Abnormal; Notable for the following components:      Result Value   SARS Coronavirus 2 POSITIVE (*)    All other components within normal limits  CBC WITH DIFFERENTIAL/PLATELET - Abnormal; Notable for the following components:   WBC 11.7 (*)    Lymphs Abs 5.8 (*)    Monocytes Absolute 1.1 (*)    All other components within normal limits  COMPREHENSIVE METABOLIC PANEL - Abnormal; Notable for the following components:   Potassium 3.4 (*)    Glucose, Bld 129 (*)    Creatinine, Ser 1.01 (*)    GFR calc non Af Amer 56 (*)    All other components within normal limits  LACTIC ACID, PLASMA - Abnormal; Notable for the following components:   Lactic Acid, Venous 4.7 (*)    All other components within normal limits  CK - Abnormal; Notable for the following components:   Total CK 37 (*)    All other components within normal limits  CULTURE, BLOOD (ROUTINE X 2)  CULTURE, BLOOD (ROUTINE X 2)  D-DIMER, QUANTITATIVE (NOT AT [facility])  BRAIN NATRIURETIC PEPTIDE  TRIGLYCERIDES  FIBRINOGEN  LACTIC ACID, PLASMA   PROCALCITONIN  LACTATE DEHYDROGENASE  FERRITIN  C-REACTIVE PROTEIN  INTERLEUKIN-6, PLASMA  ABO/RH  TYPE AND SCREEN  TROPONIN I (HIGH SENSITIVITY)  TROPONIN I (HIGH SENSITIVITY)    EKG None  Radiology Dg Chest Portable 1 View  Result Date: 04/03/2019 CLINICAL DATA:  Shortness of breath EXAM: PORTABLE CHEST 1 VIEW COMPARISON:  July 06, 2018 FINDINGS: The heart size and mediastinal contours are within normal limits. Both lungs are clear. The visualized skeletal structures are unremarkable. IMPRESSION: No acute cardiopulmonary process. Electronically Signed   By: Jonna Clark M.D.   On: 04/03/2019 23:28    Procedures Procedures (including critical care time)  Medications Ordered in ED Medications  methylPREDNISolone sodium succinate (SOLU-MEDROL) 125 mg/2 mL injection 80 mg (has no administration in time range)  magnesium sulfate IVPB 2 g 50 mL (has no administration in time range)  albuterol (VENTOLIN HFA) 108 (90 Base) MCG/ACT inhaler 6 puff (has no administration in time range)  albuterol (VENTOLIN HFA) 108 (90 Base) MCG/ACT inhaler (  Given 04/03/19 2252)     Initial Impression / Assessment and Plan / ED Course  I have reviewed the triage vital signs and the nursing notes.  Pertinent labs & imaging results that were available during my care of the patient were reviewed by me and considered in my medical decision making (see chart for details).        Patient with shortness of breath since yesterday, history of asthma.  She is tachypneic and having trouble breathing but not hypoxic.  Her lungs are diminished but clear without wheezing  She is given bronchodilators and steroids.  Will obtain chest x-ray as well as d-dimer.  Chest x-ray is negative for pneumonia or pneumothorax.  Her EKG is nonischemic. D-dimer is negative  Patient feels improved after receiving bronchodilators, steroids and magnesium.  She is able to exchange air better and she is not wheezing.  Her  x-ray is clear.  However she remains tachypneic.  We will proceed with nebulizer after coronavirus test results. Low suspicion for pulmonary embolism or ACS.  Coronavirus test is positive.  Patient is not hypoxic although sh is significantly tachypneic.  Suspect she will need admission.  Lactate is elevated.  Patient will be hydrated judiciously.  Suspect this is due to her work of breathing rather than sepsis.  She is afebrile.  Admission discussed with Dr. Selena Batten.  ED ECG REPORT   Date: 04/04/2019  Rate: 84  Rhythm: normal sinus rhythm  QRS Axis: normal  Intervals: normal  ST/T Wave abnormalities: normal  Conduction Disutrbances:none  Narrative Interpretation:   Old EKG Reviewed: unchanged  I have personally reviewed the EKG tracing and agree with the computerized printout as noted.   Betty Golden was evaluated in Emergency Department on 04/04/2019 for the symptoms described in the history of present illness. She was evaluated in the context of the global COVID-19 pandemic, which necessitated consideration that the patient might be at risk for infection with the SARS-CoV-2 virus that causes COVID-19. Institutional protocols and algorithms that pertain to the evaluation of patients at risk for COVID-19 are in a state of rapid change based on information released by regulatory bodies including the CDC and federal and state organizations. These policies and algorithms were followed during the patient's care in the ED.  CRITICAL CARE Performed  by: Glynn OctaveStephen Nimisha Rathel Total critical care time: 35 minutes Critical care time was exclusive of separately billable procedures and treating other patients. Critical care was necessary to treat or prevent imminent or life-threatening deterioration. Critical care was time spent personally by me on the following activities: development of treatment plan with patient and/or surrogate as well as nursing, discussions with consultants, evaluation of  patient's response to treatment, examination of patient, obtaining history from patient or surrogate, ordering and performing treatments and interventions, ordering and review of laboratory studies, ordering and review of radiographic studies, pulse oximetry and re-evaluation of patient's condition.  Final Clinical Impressions(s) / ED Diagnoses   Final diagnoses:  COVID-19 virus infection    ED Discharge Orders    None       Liviya Santini, Jeannett SeniorStephen, MD 04/04/19 [phone]

## 2019-04-03 NOTE — ED Triage Notes (Addendum)
Patient complaining of shortness of breath since last night. Denies fever or cough. States she has history of asthma and inhalers are not effective. O2 100% on room air at triage. Patient also complaining of vomiting today "because of that nasty mucus."

## 2019-04-03 NOTE — Progress Notes (Signed)
Patient given 8 puffs albuterol inhaler with spacer

## 2019-04-04 ENCOUNTER — Encounter (HOSPITAL_COMMUNITY): Payer: Self-pay

## 2019-04-04 ENCOUNTER — Observation Stay (HOSPITAL_COMMUNITY): Payer: Medicare Other

## 2019-04-04 DIAGNOSIS — J1289 Other viral pneumonia: Secondary | ICD-10-CM | POA: Diagnosis not present

## 2019-04-04 DIAGNOSIS — Z825 Family history of asthma and other chronic lower respiratory diseases: Secondary | ICD-10-CM | POA: Diagnosis not present

## 2019-04-04 DIAGNOSIS — E876 Hypokalemia: Secondary | ICD-10-CM

## 2019-04-04 DIAGNOSIS — Z823 Family history of stroke: Secondary | ICD-10-CM | POA: Diagnosis not present

## 2019-04-04 DIAGNOSIS — M797 Fibromyalgia: Secondary | ICD-10-CM | POA: Diagnosis not present

## 2019-04-04 DIAGNOSIS — R0603 Acute respiratory distress: Secondary | ICD-10-CM | POA: Diagnosis not present

## 2019-04-04 DIAGNOSIS — J440 Chronic obstructive pulmonary disease with acute lower respiratory infection: Secondary | ICD-10-CM | POA: Diagnosis not present

## 2019-04-04 DIAGNOSIS — U071 COVID-19: Secondary | ICD-10-CM | POA: Diagnosis present

## 2019-04-04 DIAGNOSIS — E860 Dehydration: Secondary | ICD-10-CM | POA: Diagnosis not present

## 2019-04-04 DIAGNOSIS — K589 Irritable bowel syndrome without diarrhea: Secondary | ICD-10-CM | POA: Diagnosis not present

## 2019-04-04 DIAGNOSIS — F329 Major depressive disorder, single episode, unspecified: Secondary | ICD-10-CM | POA: Diagnosis not present

## 2019-04-04 DIAGNOSIS — Z7952 Long term (current) use of systemic steroids: Secondary | ICD-10-CM | POA: Diagnosis not present

## 2019-04-04 DIAGNOSIS — Z87891 Personal history of nicotine dependence: Secondary | ICD-10-CM | POA: Diagnosis not present

## 2019-04-04 DIAGNOSIS — Z882 Allergy status to sulfonamides status: Secondary | ICD-10-CM | POA: Diagnosis not present

## 2019-04-04 DIAGNOSIS — R0600 Dyspnea, unspecified: Secondary | ICD-10-CM | POA: Diagnosis present

## 2019-04-04 DIAGNOSIS — Z833 Family history of diabetes mellitus: Secondary | ICD-10-CM | POA: Diagnosis not present

## 2019-04-04 DIAGNOSIS — A0839 Other viral enteritis: Secondary | ICD-10-CM | POA: Diagnosis not present

## 2019-04-04 DIAGNOSIS — I10 Essential (primary) hypertension: Secondary | ICD-10-CM | POA: Diagnosis not present

## 2019-04-04 DIAGNOSIS — Z7951 Long term (current) use of inhaled steroids: Secondary | ICD-10-CM | POA: Diagnosis not present

## 2019-04-04 DIAGNOSIS — E785 Hyperlipidemia, unspecified: Secondary | ICD-10-CM | POA: Diagnosis not present

## 2019-04-04 DIAGNOSIS — J4540 Moderate persistent asthma, uncomplicated: Secondary | ICD-10-CM | POA: Diagnosis not present

## 2019-04-04 DIAGNOSIS — Z8249 Family history of ischemic heart disease and other diseases of the circulatory system: Secondary | ICD-10-CM | POA: Diagnosis not present

## 2019-04-04 DIAGNOSIS — M069 Rheumatoid arthritis, unspecified: Secondary | ICD-10-CM | POA: Diagnosis not present

## 2019-04-04 DIAGNOSIS — Z79899 Other long term (current) drug therapy: Secondary | ICD-10-CM | POA: Diagnosis not present

## 2019-04-04 DIAGNOSIS — K219 Gastro-esophageal reflux disease without esophagitis: Secondary | ICD-10-CM | POA: Diagnosis not present

## 2019-04-04 LAB — COMPREHENSIVE METABOLIC PANEL
ALT: <5 U/L (ref 0–44)
AST: 31 U/L (ref 15–41)
Albumin: 3.6 g/dL (ref 3.5–5.0)
Alkaline Phosphatase: 49 U/L (ref 38–126)
Anion gap: 20 — ABNORMAL HIGH (ref 5–15)
BUN: 14 mg/dL (ref 8–23)
CO2: 19 mmol/L — ABNORMAL LOW (ref 22–32)
Calcium: 8.9 mg/dL (ref 8.9–10.3)
Chloride: 103 mmol/L (ref 98–111)
Creatinine, Ser: 1.14 mg/dL — ABNORMAL HIGH (ref 0.44–1.00)
GFR calc Af Amer: 56 mL/min — ABNORMAL LOW (ref >60)
GFR calc non Af Amer: 48 mL/min — ABNORMAL LOW (ref >60)
Glucose, Bld: 240 mg/dL — ABNORMAL HIGH (ref 70–99)
Potassium: 2.9 mmol/L — ABNORMAL LOW (ref 3.5–5.1)
Sodium: 142 mmol/L (ref 135–145)
Total Bilirubin: 0.2 mg/dL — ABNORMAL LOW (ref 0.3–1.2)
Total Protein: 7.3 g/dL (ref 6.5–8.1)

## 2019-04-04 LAB — CBC WITH DIFFERENTIAL/PLATELET
Abs Immature Granulocytes: 0.05 10*3/uL (ref 0.00–0.07)
Basophils Absolute: 0 10*3/uL (ref 0.0–0.1)
Basophils Relative: 0 %
Eosinophils Absolute: 0 10*3/uL (ref 0.0–0.5)
Eosinophils Relative: 0 %
HCT: 38.6 % (ref 36.0–46.0)
Hemoglobin: 12 g/dL (ref 12.0–15.0)
Immature Granulocytes: 1 %
Lymphocytes Relative: 11 %
Lymphs Abs: 0.9 10*3/uL (ref 0.7–4.0)
MCH: 28.2 pg (ref 26.0–34.0)
MCHC: 31.1 g/dL (ref 30.0–36.0)
MCV: 90.8 fL (ref 80.0–100.0)
Monocytes Absolute: 0 10*3/uL — ABNORMAL LOW (ref 0.1–1.0)
Monocytes Relative: 1 %
Neutro Abs: 6.7 10*3/uL (ref 1.7–7.7)
Neutrophils Relative %: 87 %
Platelets: 283 10*3/uL (ref 150–400)
RBC: 4.25 MIL/uL (ref 3.87–5.11)
RDW: 15.1 % (ref 11.5–15.5)
WBC: 7.7 10*3/uL (ref 4.0–10.5)
nRBC: 0 % (ref 0.0–0.2)

## 2019-04-04 LAB — C-REACTIVE PROTEIN
CRP: <0.8 mg/dL (ref <1.0)
CRP: <0.8 mg/dL (ref <1.0)

## 2019-04-04 LAB — CK: Total CK: 37 U/L — ABNORMAL LOW (ref 38–234)

## 2019-04-04 LAB — PROCALCITONIN: Procalcitonin: <0.1 ng/mL

## 2019-04-04 LAB — FIBRINOGEN: Fibrinogen: 437 mg/dL (ref 210–475)

## 2019-04-04 LAB — LACTIC ACID, PLASMA
Lactic Acid, Venous: 4.7 mmol/L (ref 0.5–1.9)
Lactic Acid, Venous: 8.9 mmol/L (ref 0.5–1.9)

## 2019-04-04 LAB — TROPONIN I (HIGH SENSITIVITY): Troponin I (High Sensitivity): 5 ng/L (ref <18)

## 2019-04-04 LAB — TRIGLYCERIDES: Triglycerides: 117 mg/dL (ref <150)

## 2019-04-04 LAB — MAGNESIUM: Magnesium: 2.5 mg/dL — ABNORMAL HIGH (ref 1.7–2.4)

## 2019-04-04 LAB — FERRITIN: Ferritin: 129 ng/mL (ref 11–307)

## 2019-04-04 LAB — LACTATE DEHYDROGENASE: LDH: 170 U/L (ref 98–192)

## 2019-04-04 LAB — SARS CORONAVIRUS 2 BY RT PCR (HOSPITAL ORDER, PERFORMED IN ~~LOC~~ HOSPITAL LAB): SARS Coronavirus 2: POSITIVE — AB

## 2019-04-04 MED ORDER — ALBUTEROL SULFATE HFA 108 (90 BASE) MCG/ACT IN AERS
4.0000 | INHALATION_SPRAY | Freq: Four times a day (QID) | RESPIRATORY_TRACT | Status: DC | PRN
  Administered 2019-04-04: 4 via RESPIRATORY_TRACT

## 2019-04-04 MED ORDER — HYDRALAZINE HCL 20 MG/ML IJ SOLN
10.0000 mg | Freq: Four times a day (QID) | INTRAMUSCULAR | Status: DC | PRN
  Administered 2019-04-04 – 2019-04-05 (×2): 10 mg via INTRAVENOUS
  Filled 2019-04-04 (×2): qty 1

## 2019-04-04 MED ORDER — SODIUM CHLORIDE 0.9 % IV SOLN
500.0000 mg | INTRAVENOUS | Status: DC
  Administered 2019-04-04: 500 mg via INTRAVENOUS
  Filled 2019-04-04: qty 500

## 2019-04-04 MED ORDER — GABAPENTIN 300 MG PO CAPS
300.0000 mg | ORAL_CAPSULE | Freq: Three times a day (TID) | ORAL | Status: DC
  Administered 2019-04-04 – 2019-04-06 (×7): 300 mg via ORAL
  Filled 2019-04-04 (×7): qty 1

## 2019-04-04 MED ORDER — DICYCLOMINE HCL 10 MG PO CAPS
10.0000 mg | ORAL_CAPSULE | Freq: Two times a day (BID) | ORAL | Status: AC
  Administered 2019-04-04 (×2): 10 mg via ORAL
  Filled 2019-04-04 (×2): qty 1

## 2019-04-04 MED ORDER — PSYLLIUM 95 % PO PACK
1.0000 | PACK | Freq: Every day | ORAL | Status: DC
  Administered 2019-04-04 – 2019-04-05 (×2): 1 via ORAL
  Filled 2019-04-04 (×2): qty 1

## 2019-04-04 MED ORDER — HYOSCYAMINE SULFATE 0.125 MG SL SUBL
0.2500 mg | SUBLINGUAL_TABLET | Freq: Four times a day (QID) | SUBLINGUAL | Status: DC
  Filled 2019-04-04 (×2): qty 2

## 2019-04-04 MED ORDER — POTASSIUM CHLORIDE CRYS ER 20 MEQ PO TBCR
20.0000 meq | EXTENDED_RELEASE_TABLET | Freq: Once | ORAL | Status: AC
  Administered 2019-04-04: 20 meq via ORAL
  Filled 2019-04-04: qty 1

## 2019-04-04 MED ORDER — HYDRALAZINE HCL 50 MG PO TABS
50.0000 mg | ORAL_TABLET | Freq: Three times a day (TID) | ORAL | Status: DC
  Administered 2019-04-04 – 2019-04-05 (×4): 50 mg via ORAL
  Filled 2019-04-04 (×4): qty 1

## 2019-04-04 MED ORDER — LIDOCAINE VISCOUS HCL 2 % MT SOLN
15.0000 mL | Freq: Four times a day (QID) | OROMUCOSAL | Status: DC
  Administered 2019-04-04: 15 mL via ORAL
  Filled 2019-04-04 (×2): qty 15

## 2019-04-04 MED ORDER — HYDROCORTISONE (PERIANAL) 2.5 % EX CREA
TOPICAL_CREAM | Freq: Two times a day (BID) | CUTANEOUS | Status: DC
  Administered 2019-04-04: 09:00:00 via RECTAL
  Filled 2019-04-04: qty 28.35

## 2019-04-04 MED ORDER — ALBUTEROL SULFATE HFA 108 (90 BASE) MCG/ACT IN AERS
2.0000 | INHALATION_SPRAY | RESPIRATORY_TRACT | Status: DC | PRN
  Administered 2019-04-04: 2 via RESPIRATORY_TRACT
  Filled 2019-04-04: qty 6.7

## 2019-04-04 MED ORDER — SIMVASTATIN 20 MG PO TABS: 20.0000 mg | ORAL_TABLET | Freq: Every day | ORAL | Status: DC

## 2019-04-04 MED ORDER — POTASSIUM CHLORIDE CRYS ER 20 MEQ PO TBCR
40.0000 meq | EXTENDED_RELEASE_TABLET | Freq: Once | ORAL | Status: AC
  Administered 2019-04-04: 40 meq via ORAL
  Filled 2019-04-04: qty 2

## 2019-04-04 MED ORDER — MAGIC MOUTHWASH W/LIDOCAINE
10.0000 mL | Freq: Four times a day (QID) | ORAL | Status: DC
  Administered 2019-04-04 – 2019-04-06 (×8): 10 mL via ORAL
  Filled 2019-04-04 (×9): qty 10

## 2019-04-04 MED ORDER — SODIUM CHLORIDE 0.9 % IV SOLN: 250.0000 mL | INTRAVENOUS | Status: DC | PRN

## 2019-04-04 MED ORDER — ALUM & MAG HYDROXIDE-SIMETH 200-200-20 MG/5ML PO SUSP
30.0000 mL | Freq: Four times a day (QID) | ORAL | Status: DC
  Administered 2019-04-04: 30 mL via ORAL
  Filled 2019-04-04: qty 30

## 2019-04-04 MED ORDER — POTASSIUM CHLORIDE 2 MEQ/ML IV SOLN
INTRAVENOUS | Status: DC
  Administered 2019-04-04 (×2): via INTRAVENOUS
  Filled 2019-04-04 (×3): qty 1000

## 2019-04-04 MED ORDER — FLUTICASONE PROPIONATE HFA 44 MCG/ACT IN AERO
1.0000 | INHALATION_SPRAY | Freq: Two times a day (BID) | RESPIRATORY_TRACT | Status: DC
  Administered 2019-04-04 – 2019-04-06 (×5): 1 via RESPIRATORY_TRACT
  Filled 2019-04-04: qty 10.6

## 2019-04-04 MED ORDER — SODIUM CHLORIDE 0.9 % IV BOLUS
1000.0000 mL | Freq: Once | INTRAVENOUS | Status: AC
  Administered 2019-04-04: 03:00:00 1000 mL via INTRAVENOUS

## 2019-04-04 MED ORDER — FLUTICASONE FUROATE 100 MCG/ACT IN AEPB: 1.0000 | INHALATION_SPRAY | Freq: Every day | RESPIRATORY_TRACT | Status: DC

## 2019-04-04 MED ORDER — PANTOPRAZOLE SODIUM 40 MG PO TBEC
40.0000 mg | DELAYED_RELEASE_TABLET | Freq: Two times a day (BID) | ORAL | Status: DC
  Administered 2019-04-04 – 2019-04-06 (×5): 40 mg via ORAL
  Filled 2019-04-04 (×5): qty 1

## 2019-04-04 MED ORDER — SODIUM CHLORIDE 0.9% FLUSH
3.0000 mL | Freq: Two times a day (BID) | INTRAVENOUS | Status: DC
  Administered 2019-04-04: 02:00:00 3 mL via INTRAVENOUS

## 2019-04-04 MED ORDER — LACTATED RINGERS IV SOLN
INTRAVENOUS | Status: DC
  Administered 2019-04-04: 08:00:00 via INTRAVENOUS

## 2019-04-04 MED ORDER — DEXAMETHASONE SODIUM PHOSPHATE 10 MG/ML IJ SOLN
6.0000 mg | INTRAMUSCULAR | Status: DC
  Administered 2019-04-04: 6 mg via INTRAVENOUS
  Filled 2019-04-04: qty 1

## 2019-04-04 MED ORDER — DEXAMETHASONE SODIUM PHOSPHATE 10 MG/ML IJ SOLN
6.0000 mg | INTRAMUSCULAR | Status: DC
  Administered 2019-04-04: 02:00:00 6 mg via INTRAVENOUS
  Filled 2019-04-04: qty 1

## 2019-04-04 MED ORDER — DULOXETINE HCL 20 MG PO CPEP
20.0000 mg | ORAL_CAPSULE | Freq: Two times a day (BID) | ORAL | Status: DC
  Administered 2019-04-04 – 2019-04-06 (×5): 20 mg via ORAL
  Filled 2019-04-04 (×5): qty 1

## 2019-04-04 MED ORDER — SODIUM BICARBONATE 650 MG PO TABS: 650.0000 mg | ORAL_TABLET | Freq: Three times a day (TID) | ORAL | Status: DC

## 2019-04-04 MED ORDER — SODIUM BICARBONATE 650 MG PO TABS
650.0000 mg | ORAL_TABLET | Freq: Three times a day (TID) | ORAL | Status: AC
  Administered 2019-04-04 – 2019-04-06 (×6): 650 mg via ORAL
  Filled 2019-04-04 (×6): qty 1

## 2019-04-04 MED ORDER — ATORVASTATIN CALCIUM 10 MG PO TABS
10.0000 mg | ORAL_TABLET | Freq: Every day | ORAL | Status: DC
  Administered 2019-04-04 – 2019-04-05 (×2): 10 mg via ORAL
  Filled 2019-04-04 (×2): qty 1

## 2019-04-04 MED ORDER — ACETAMINOPHEN 325 MG PO TABS
650.0000 mg | ORAL_TABLET | Freq: Four times a day (QID) | ORAL | Status: DC | PRN
  Administered 2019-04-04 – 2019-04-05 (×2): 650 mg via ORAL
  Filled 2019-04-04 (×2): qty 2

## 2019-04-04 MED ORDER — LOSARTAN POTASSIUM 25 MG PO TABS
100.0000 mg | ORAL_TABLET | Freq: Every day | ORAL | Status: DC
  Administered 2019-04-04 – 2019-04-06 (×3): 100 mg via ORAL
  Filled 2019-04-04 (×3): qty 4

## 2019-04-04 MED ORDER — ENOXAPARIN SODIUM 40 MG/0.4ML ~~LOC~~ SOLN
40.0000 mg | SUBCUTANEOUS | Status: DC
  Administered 2019-04-04 – 2019-04-06 (×3): 40 mg via SUBCUTANEOUS
  Filled 2019-04-04 (×3): qty 0.4

## 2019-04-04 MED ORDER — AMLODIPINE BESYLATE 5 MG PO TABS
10.0000 mg | ORAL_TABLET | Freq: Every day | ORAL | Status: DC
  Administered 2019-04-04 – 2019-04-06 (×3): 10 mg via ORAL
  Filled 2019-04-04 (×3): qty 2

## 2019-04-04 MED ORDER — SODIUM CHLORIDE 0.9% FLUSH: 3.0000 mL | INTRAVENOUS | Status: DC | PRN

## 2019-04-04 MED ORDER — DEXAMETHASONE SODIUM PHOSPHATE 4 MG/ML IJ SOLN
4.0000 mg | INTRAMUSCULAR | Status: DC
  Filled 2019-04-04: qty 1

## 2019-04-04 MED ORDER — ALBUTEROL SULFATE HFA 108 (90 BASE) MCG/ACT IN AERS
2.0000 | INHALATION_SPRAY | Freq: Four times a day (QID) | RESPIRATORY_TRACT | Status: DC
  Administered 2019-04-04: 2 via RESPIRATORY_TRACT
  Filled 2019-04-04: qty 6.7

## 2019-04-04 NOTE — H&P (Addendum)
TRH H&P    Patient Demographics:    Betty Golden, is a 73 y.o. female  MRN: [id number]  DOB - 04-Feb-1946  Admit Date - 04/03/2019  Referring MD/NP/PA:  Ezequiel Essex  Outpatient Primary MD for the patient is Milam, Florene Route, MD  Patient coming from:  home  Chief complaint- dyspnea   HPI:    Betty Golden  is a 73 y.o. female,   w hypertension, hyperlipidemia, Gerd, IBS, h/o C. Diff,  Fibromyalgia,  RA, Asthma/ Copd apparently c/o dyspnea x2 days, and cough w yellow sputum x1 day, slight diarrhea x 1 day.  pt denies fever, chills, cp, palp, n/v, abd pain, brbpr, black stool, dysuria, hematuria.  Pt not sure about how she contracted covid-19.  No alteration in sense of taste or smell.    In ED,  T 98.1, P 97 R 40, Bp 142/75  Pox 100% on RA Wt 74.8kg  CXR IMPRESSION: No acute cardiopulmonary process.  Wbc 11.7, Hgb 13.0, Plt 327 Na 140, K 3.4,  Bun 14, Creatinine 1.01 Ast 23, Alt 20 D dimer <0.27 Trop 6 BNP 28  Covid -19 positive  Pt given albuterol HFA puffs along with solumedro '80mg'$  iv x1, and magnesium in ED.   Pt will be admitted for covid -19 infection , ER was concerned about her tachypnea.     Review of systems:    In addition to the HPI above,  No Fever-chills, No Headache, No changes with Vision or hearing, No problems swallowing food or Liquids, No Chest pain,   No Abdominal pain, No Nausea or Vomiting, bowel movements are regular, No Blood in stool or Urine, No dysuria, No new skin rashes or bruises, No new joints pains-aches,  No new weakness, tingling, numbness in any extremity, No recent weight gain or loss, No polyuria, polydypsia or polyphagia, No significant Mental Stressors.  All other systems reviewed and are negative.    Past History of the following :    Past Medical History:  Diagnosis Date  . Arthritis   . Asthma   . C. difficile colitis   .  Cervical stenosis of spine   . Chronic headaches   . Chronic neck pain   . Depression   . Diverticulosis   . Essential hypertension   . Fibromyalgia   . GERD (gastroesophageal reflux disease)   . History of Salmonella gastroenteritis   . HNP (herniated nucleus pulposus), lumbar   . Hyperlipidemia   . IBS (irritable bowel syndrome)   . Lung nodule       Past Surgical History:  Procedure Laterality Date  . APPENDECTOMY  1965  . BIOPSY  08/26/2018   Procedure: BIOPSY;  Surgeon: Rogene Houston, MD;  [facility];  Service: Endoscopy;;  gastric  . BREAST BIOPSY    . BREAST REDUCTION SURGERY  1994  . CHOLECYSTECTOMY  1975  . COLONOSCOPY N/A 08/19/2013   Procedure: COLONOSCOPY;  Surgeon: Rogene Houston, MD;  [facility];  Service: Endoscopy;  Laterality: N/A;  155-moved to  140 Ann to notify pt  . COLONOSCOPY N/A 08/26/2018   Procedure: COLONOSCOPY;  Surgeon: Rogene Houston, MD;  [facility];  Service: Endoscopy;  Laterality: N/A;  . ESOPHAGOGASTRODUODENOSCOPY N/A 08/26/2018   Procedure: ESOPHAGOGASTRODUODENOSCOPY (EGD);  Surgeon: Rogene Houston, MD;  Location: [facility];  Service: Endoscopy;  Laterality: N/A;  . fibromyalgia    . LUMBAR LAMINECTOMY/DECOMPRESSION MICRODISCECTOMY Bilateral 09/20/2015   Procedure: MICRO LUMBAR DECOMPRESSION L5-S1 BILATERAL    (1 LEVEL);  Surgeon: Susa Day, MD;  Location: [facility];  Service: Orthopedics;  Laterality: Bilateral;  . rheumatoid arthritis    . Sinus Surgery    . TONSILLECTOMY  1968      Social History:      Social History   Tobacco Use  . Smoking status: Former Smoker    Packs/day: 2.00    Years: 10.00    Pack years: 20.00    Types: Cigarettes    Quit date: 09/11/1988    Years since quitting: 30.5  . Smokeless tobacco: Never Used  . Tobacco comment: quit 1990  Substance Use Topics  . Alcohol use: No    Comment: stopped in 1990       Family History :     Family History   Problem Relation Age of Onset  . Heart disease Mother   . Hypertension Mother   . Asthma Mother   . Heart disease Father   . Stroke Father   . Hypertension Father   . Stroke Sister   . Asthma Sister   . Hypertension Brother   . Asthma Brother   . Asthma Maternal Grandmother   . Heart disease Maternal Grandmother   . Diabetes Maternal Grandfather   . Asthma Maternal Aunt   . Colon cancer Neg Hx        Home Medications:   Prior to Admission medications   Medication Sig Start Date End Date Taking? Authorizing Provider  albuterol (PROAIR HFA) 108 (90 Base) MCG/ACT inhaler Inhale 2 puffs into the lungs every 4 (four) hours as needed for wheezing or shortness of breath. 10/07/18   Valentina Shaggy, MD  DULoxetine (CYMBALTA) 20 MG capsule Take 20 mg by mouth 2 (two) times daily.    [provider]  Fluticasone Furoate (ARNUITY ELLIPTA) 100 MCG/ACT AEPB Inhale 1 Dose into the lungs daily. 10/07/18   Valentina Shaggy, MD  gabapentin (NEURONTIN) 300 MG capsule Take 300 mg by mouth 3 (three) times daily. 06/24/18   [provider]  HUMIRA 40 MG/0.4ML PSKT  09/18/18   [provider]  hydrocortisone (ANUSOL-HC) 2.5 % rectal cream Place rectally 2 (two) times daily. 12/01/18   Setzer, Rona Ravens, NP  losartan-hydrochlorothiazide (HYZAAR) 100-12.5 MG per tablet Take 1 tablet by mouth daily.    [provider]  simvastatin (ZOCOR) 20 MG tablet Take 20 mg by mouth at bedtime.     [provider]  Wheat Dextrin (BENEFIBER DRINK MIX) PACK Take 4 g by mouth at bedtime. 08/26/18   Rogene Houston, MD     Allergies:     Allergies  Allergen Reactions  . Sulfa Antibiotics Rash     Physical Exam:   Vitals  Blood pressure (!) 141/57, pulse 94, temperature 98.1 F (36.7 C), temperature source Oral, resp. rate 20, height '5\' 3"'$  (1.6 m), weight 74.8 kg, SpO2 100 %.  1.  General: axoxo3  2. Psychiatric: Slightly anxious  3. Neurologic: Cn 2-12  intact, reflexes 2+ symmetric, diffuse with  no clonus, motor 5/5 in all 4 ext  4. HEENMT:  Anicteric, pupils 1.40m symmetric, direct, consensual, near intact Neck: no jvd  5. Respiratory : Slightly tight, no crackles. No wheezing  6. Cardiovascular : rrr s1, s2, no m/g/r  7. Gastrointestinal:  Abd: soft, obese, nt, nd, +bs  8. Skin:  Ext: no c/c/e,  No rash  9.Musculoskeletal:  Good ROM,      Data Review:    CBC Recent Labs  Lab 04/03/19 2238  WBC 11.7*  HGB 13.0  HCT 41.6  PLT 327  MCV 91.6  MCH 28.6  MCHC 31.3  RDW 15.1  LYMPHSABS 5.8*  MONOABS 1.1*  EOSABS 0.0  BASOSABS 0.0   ------------------------------------------------------------------------------------------------------------------  Results for orders placed or performed during the hospital encounter of 04/03/19 (from the past 48 hour(s))  CBC with Differential/Platelet     Status: Abnormal   Collection Time: 04/03/19 10:38 PM  Result Value Ref Range   WBC 11.7 (H) 4.0 - 10.5 K/uL   RBC 4.54 3.87 - 5.11 MIL/uL   Hemoglobin 13.0 12.0 - 15.0 g/dL   HCT 41.6 36.0 - 46.0 %   MCV 91.6 80.0 - 100.0 fL   MCH 28.6 26.0 - 34.0 pg   MCHC 31.3 30.0 - 36.0 g/dL   RDW 15.1 11.5 - 15.5 %   Platelets 327 150 - 400 K/uL   nRBC 0.0 0.0 - 0.2 %   Neutrophils Relative % 41 %   Neutro Abs 4.9 1.7 - 7.7 K/uL   Lymphocytes Relative 50 %   Lymphs Abs 5.8 (H) 0.7 - 4.0 K/uL   Monocytes Relative 9 %   Monocytes Absolute 1.1 (H) 0.1 - 1.0 K/uL   Eosinophils Relative 0 %   Eosinophils Absolute 0.0 0.0 - 0.5 K/uL   Basophils Relative 0 %   Basophils Absolute 0.0 0.0 - 0.1 K/uL   Immature Granulocytes 0 %   Abs Immature Granulocytes 0.03 0.00 - 0.07 K/uL    Comment: [facility] [address] [place] [address] Comprehensive metabolic panel     Status: Abnormal   Collection Time: 04/03/19 10:38 PM  Result Value Ref Range   Sodium 140 135 - 145 mmol/L   Potassium 3.4 (L) 3.5 - 5.1 mmol/L    Chloride 102 98 - 111 mmol/L   CO2 27 22 - 32 mmol/L   Glucose, Bld 129 (H) 70 - 99 mg/dL   BUN 14 8 - 23 mg/dL   Creatinine, Ser 1.01 (H) 0.44 - 1.00 mg/dL   Calcium 9.3 8.9 - 10.3 mg/dL   Total Protein 7.7 6.5 - 8.1 g/dL   Albumin 4.1 3.5 - 5.0 g/dL   AST 23 15 - 41 U/L   ALT 20 0 - 44 U/L   Alkaline Phosphatase 55 38 - 126 U/L   Total Bilirubin 0.8 0.3 - 1.2 mg/dL   GFR calc non Af Amer 56 (L) >60 mL/min   GFR calc Af Amer >60 >60 mL/min   Anion gap 11 5 - 15    Comment: [facility]formed at [facility] [address], [place] [id number] D-dimer, quantitative ([facility]     Status: None   Collection Time: 04/03/19 10:38 PM  Result Value Ref Range   D-Dimer, Quant <0.27 0.00 - 0.50 ug/mL-FEU    Comment: [facility] at [facility] [address], [place] Troponin I (High Sensitivity)     Status: None  Collection Time: 04/03/19 10:38 PM  Result Value Ref Range   Troponin I (High Sensitivity) 6 <18 ng/L    Comment: (NOTE) Elevated high sensitivity troponin I (hsTnI) values and significant  changes across serial measurements may suggest ACS but many other  chronic and acute conditions are known to elevate hsTnI results.  Refer to the "Links" section for chest pain algorithms and additional  guidance. Performed at [facility], [address] [place], [place] [address]   Brain natriuretic peptide     Status: None   Collection Time: 04/03/19 10:38 PM  Result Value Ref Range   B Natriuretic Peptide 28.0 0.0 - 100.0 pg/mL    Comment: Performed at [facility], [address], [place], [place] [address]  SARS Coronavirus 2 [facility] order, Performed in [facility] hospital lab) Nasopharyngeal Nasopharyngeal Swab     Status: Abnormal   Collection Time: 04/03/19 11:20 PM   Specimen: Nasopharyngeal Swab  Result Value Ref Range   SARS Coronavirus 2 POSITIVE (A) NEGATIVE    Comment: RESULT CALLED TO, READ BACK BY AND VERIFIED WITH: HARDEN,J @ 0100 ON 04/04/19  BY JUW Performed at [facility], [address], [place], [place] [address]     Chemistries  Recent Labs  Lab 04/03/19 2238  NA 140  K 3.4*  CL 102  CO2 27  GLUCOSE 129*  BUN 14  CREATININE 1.01*  CALCIUM 9.3  AST 23  ALT 20  ALKPHOS 55  BILITOT 0.8   ------------------------------------------------------------------------------------------------------------------  ------------------------------------------------------------------------------------------------------------------ GFR: Estimated Creatinine Clearance: 49.5 mL/min (A) (by C-G formula based on SCr of 1.01 mg/dL (H)). Liver Function Tests: Recent Labs  Lab 04/03/19 2238  AST 23  ALT 20  ALKPHOS 55  BILITOT 0.8  PROT 7.7  ALBUMIN 4.1   No results for input(s): LIPASE, AMYLASE in the last 168 hours. No results for input(s): AMMONIA in the last 168 hours. Coagulation Profile: No results for input(s): INR, PROTIME in the last 168 hours. Cardiac Enzymes: No results for input(s): CKTOTAL, CKMB, CKMBINDEX, TROPONINI in the last 168 hours. BNP (last 3 results) No results for input(s): PROBNP in the last 8760 hours. HbA1C: No results for input(s): HGBA1C in the last 72 hours. CBG: No results for input(s): GLUCAP in the last 168 hours. Lipid Profile: No results for input(s): CHOL, HDL, LDLCALC, TRIG, CHOLHDL, LDLDIRECT in the last 72 hours. Thyroid Function Tests: No results for input(s): TSH, T4TOTAL, FREET4, T3FREE, THYROIDAB in the last 72 hours. Anemia Panel: No results for input(s): VITAMINB12, FOLATE, FERRITIN, TIBC, IRON, RETICCTPCT in the last 72 hours.  --------------------------------------------------------------------------------------------------------------- Urine analysis:    Component Value Date/Time   COLORURINE YELLOW 03/09/2015 [place] 03/09/2015 1035   LABSPEC <1.005 (L) 03/09/2015 1035   PHURINE 6.5 03/09/2015 1035   GLUCOSEU NEGATIVE 03/09/2015 1035   HGBUR  NEGATIVE 03/09/2015 1035   BILIRUBINUR NEGATIVE 03/09/2015 1035   KETONESUR NEGATIVE 03/09/2015 1035   PROTEINUR NEGATIVE 03/09/2015 1035   UROBILINOGEN 0.2 03/09/2015 1035   NITRITE NEGATIVE 03/09/2015 1035   LEUKOCYTESUR NEGATIVE 03/09/2015 1035      Imaging Results:    Dg Chest Portable 1 View  Result Date: 04/03/2019 CLINICAL DATA:  Shortness of breath EXAM: PORTABLE CHEST 1 VIEW COMPARISON:  July 06, 2018 FINDINGS: The heart size and mediastinal contours are within normal limits. Both lungs are clear. The visualized skeletal structures are unremarkable. IMPRESSION: No acute cardiopulmonary process. Electronically Signed   By: Prudencio Pair M.D.   On: 04/03/2019 23:28  Assessment & Plan:    Principal Problem:   COVID-19 virus infection Active Problems:   Essential hypertension   Hypokalemia   Dyspnea  Dyspnea secondary to Covid-19 infection, and Asthma/ Copd  Covid -19 infection Check esr, crp, procalcitonin, cpk, IL-6 Blood culture x2 pending Dexamethasone '6mg'$  iv qday  Bronchitis Zithromax '500mg'$  iv qday  Asthma/ Copd Dexamethasone as above Cont Albuterol HFA 2puff q6h and q6h prn  Cont Arnuity 1puff qday  Hypokalemia Replete Check cmp   Hypertension Cont Losartan/ hydrochlorothiazide 100/12.'5mg'$  po qday  Hyperlipidemia Cont Simvastatin '20mg'$  po qhs  Fibromyalgia Cont Gabapentin Cont Duloxetine '20mg'$  po qday  RA Cont Humira as outpatient  DVT Prophylaxis-   Lovenox - SCDs   AM Labs Ordered, also please review Full Orders  Family Communication: Admission, patients condition and plan of care including tests being ordered have been discussed with the patient who indicate understanding and agree with the plan and Code Status.  Code Status:  FULL CODE per patient, spoke with Daughter Elmyra Ricks and she requests to be updated if possible daily.   Admission status: Observation/: Based on patients clinical presentation and evaluation of above clinical  data, I have made determination that patient meets observation criteria at this time.    Time spent in minutes : 70   Jani Gravel M.D on 04/04/2019 at 1:26 AM

## 2019-04-04 NOTE — TOC Initial Note (Signed)
Transition of Care [facility]) - Initial/Assessment Note    Patient Details  Name: Betty Golden MRN: [id number] Date of Birth: 11-26-1945  Transition of [facility]) CM/SW Contact:    Ninfa Meeker, RN Phone Number: [phone] (working remotely) 04/04/2019, 3:58 PM  Clinical Narrative:  73 yr old female admitted from home, COVID positive. Case manager will continue to monitor for discharge needs as patient medically improves, May She be blessed to do so.                        Patient Goals and CMS Choice        Expected Discharge Plan and Services                                                Prior Living Arrangements/Services                       Activities of Daily Living Home Assistive Devices/Equipment: Gilford Rile (specify type) ADL Screening (condition at time of admission) Patient's cognitive ability adequate to safely complete daily activities?: Yes Is the patient deaf or have difficulty hearing?: Yes Does the patient have difficulty seeing, even when wearing glasses/contacts?: No Does the patient have difficulty concentrating, remembering, or making decisions?: No Patient able to express need for assistance with ADLs?: Yes Does the patient have difficulty dressing or bathing?: No Independently performs ADLs?: Yes (appropriate for developmental age) Does the patient have difficulty walking or climbing stairs?: Yes Weakness of Legs: Both Weakness of Arms/Hands: None  Permission Sought/Granted                  Emotional Assessment              Admission diagnosis:  COVID-19 virus infection [U07.1] Patient Active Problem List   Diagnosis Date Noted  . Dyspnea 04/04/2019  . COVID-19 virus infection 04/04/2019  . Gastritis and gastroduodenitis 07/08/2018  . Rectal bleeding 06/29/2018  . Fibromyalgia 09/08/2017  . Spinal stenosis of lumbar region 09/20/2015  . 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 Moderate persistent asthma  07/03/2015  . Allergic rhinitis 07/03/2015  . Colitis due to Clostridium difficile 02/10/2015  . Abdominal pain 02/10/2015  . Nausea and vomiting 02/10/2015  . Acute renal failure ([place]) 02/10/2015  . Dehydration 02/10/2015  . Hypoxia 02/10/2015  . Hyponatremia 02/10/2015  . Hypokalemia 02/10/2015  . COPD (chronic obstructive pulmonary disease) ([place])   . Unspecified constipation 07/15/2013  . DYSPNEA 04/05/2008  . Essential hypertension 03/10/2008  . Headache(784.0) 03/10/2008  . Cough 03/10/2008   PCP:  Quentin Cornwall, MD Pharmacy:   [id number] [place], [address] [place] [address] Phone: [phone] Fax: [phone]     Social Determinants of Health (SDOH) Interventions    Readmission Risk Interventions No flowsheet data found.

## 2019-04-04 NOTE — ED Notes (Signed)
Tried to call patient report. RN to call back.

## 2019-04-04 NOTE — Progress Notes (Signed)
                                  PROGRESS NOTE                                                                                                                                                                                                             Patient Demographics:    Betty Golden, is a 73 y.o. female, DOB - 11/10/1945, MRN:[id number]  Outpatient Primary MD for the patient is Milam, James T, MD    LOS - 0  Admit date - 04/03/2019    Chief Complaint  Patient presents with  . Shortness of Breath       Brief Narrative  73-year-old female with H/O hypertension, hyperlipidemia, Gerd, IBS, h/o C. Diff,  Fibromyalgia,  RA, Asthma/ Copd, scented to any pain ER with chief complaints of nausea vomiting and diarrhea which started morning of presentation.  She specifically did not have cough or shortness of breath.  In the ER her blood work was consistent with severe dehydration duration, hypokalemia and elevated lactic acid due to dehydration.  She was admitted for further care   Subjective:    [name] today has, No headache, nausea vomiting diarrhea has resolved, continues to have some epigastric and substernal discomfort, no cough or shortness of breath.   Assessment  & Plan :     1.   Acute Covid 19 Viral Pneumonitis during the ongoing 2020 Covid 19 Pandemic - she is completely symptom-free from pulmonary standpoint, inflammatory markers are stable she will be monitored on low-dose oral steroid.  Stop all antibiotics.   COVID-19 Labs  Recent Labs    04/03/19 2238 04/04/19 0211 04/04/19 0702  DDIMER <0.27  --   --   FERRITIN  --  129  --   LDH  --  170  --   CRP  --  <0.8 <0.8    Lab Results  Component Value Date   SARSCOV2NAA POSITIVE (A) 04/03/2019     Hepatic Function Latest Ref Rng & Units 04/04/2019 04/03/2019 07/06/2018  Total Protein 6.5 - 8.1 g/dL 7.3 7.7 7.8  Albumin 3.5 - 5.0 g/dL 3.6 4.1 4.1  AST 15 - 41 U/L 31 23 18  ALT  0 - 44 U/L <5 20 24  Alk Phosphatase 38 - 126 U/L 49 55 62  Total Bilirubin 0.3 - 1.2 mg/dL 0.2(L) 0.8 0.9          Component Value Date/Time   BNP 28.0 04/03/2019 2238      2.  Severe COVID-19 related gastroenteritis causing dehydration, hypokalemia and elevated lactate.  Hydrate, replace potassium and monitor lactic acid.  Appears nontoxic, no signs of infection.  Abdominal exam overall is benign.  3.  Epigastric discomfort due to severe nausea vomiting.  GI cocktail and PPI.  4.History of asthma.  Stable.  5.  RA.  Continue outpatient Humira regimen.  6.  Dyslipidemia.  On statin.  7.  Essential hypertension.  On ARB, Norvasc and hydralazine added for better control.  8.  Fibromyalgia.  Continue combination of Neurontin and duloxetine.    Condition - Guarded  Family Communication  : daughter 04/04/19  Code Status :  Full  Diet :   Diet Order            Diet Heart Room service appropriate? Yes; Fluid consistency: Thin  Diet effective now               Disposition Plan  :  Home 2-3 days  Consults  :  None  Procedures  :    PUD Prophylaxis : PPI  DVT Prophylaxis  :  Lovenox   Lab Results  Component Value Date   PLT 283 04/04/2019    Inpatient Medications  Scheduled Meds: . alum & mag hydroxide-simeth  30 mL Oral Q6H   And  . lidocaine  15 mL Oral Q6H  . amLODipine  10 mg Oral Daily  . atorvastatin  10 mg Oral q1800  . [START ON 04/05/2019] dexamethasone (DECADRON) injection  4 mg Intravenous Q24H  . DULoxetine  20 mg Oral BID  . enoxaparin (LOVENOX) injection  40 mg Subcutaneous Q24H  . fluticasone  1 puff Inhalation BID  . gabapentin  300 mg Oral TID  . hydrALAZINE  50 mg Oral Q8H  . hydrocortisone   Rectal BID  . hyoscyamine  0.25 mg Sublingual Q6H  . losartan  100 mg Oral Daily  . pantoprazole  40 mg Oral BID  . potassium chloride  40 mEq Oral Once  . psyllium  1 packet Oral QHS  . sodium bicarbonate  650 mg Oral TID   Continuous Infusions:  . lactated ringers with kcl     PRN Meds:.acetaminophen, albuterol, hydrALAZINE  Antibiotics  :    Anti-infectives (From admission, onward)   Start     Dose/Rate Route Frequency Ordered Stop   04/04/19 0230  azithromycin (ZITHROMAX) 500 mg in sodium chloride 0.9 % 250 mL IVPB  Status:  Discontinued     500 mg 250 mL/hr over 60 Minutes Intravenous Every 24 hours 04/04/19 0225 04/04/19 0703       Time Spent in minutes  30   Prashant Singh M.D on 04/04/2019 at 10:18 AM  To page go to [email or web address] - password [email or web address]  [facility] -  Office  [phone]  See all Orders from today for further details    Objective:   Vitals:   04/04/19 0220 04/04/19 0230 04/04/19 0310 04/04/19 0400  BP: (!) 143/78 (!) 154/69 (!) 157/84 (!) 174/71  Pulse: 90 (!) 102 88 90  Resp: (!) 23 19 18 14  Temp:    98.1 F (36.7 C)  TempSrc:    Oral  SpO2: 100% 99% 100% 99%  Weight:      Height:        Wt Readings from Last 3 Encounters:  04/03/19 74.8 kg  01/12/19 75.3 kg  12/01/18 74.9 kg       Intake/Output Summary (Last 24 hours) at 04/04/2019 1018 Last data filed at 04/04/2019 0400 Gross per 24 hour  Intake 1299 ml  Output 400 ml  Net 899 ml     Physical Exam  Awake Alert, Oriented X 3, No new F.N deficits, Normal affect [place].AT,PERRAL Supple Neck,No JVD, No cervical lymphadenopathy appriciated.  Symmetrical Chest wall movement, Good air movement bilaterally, CTAB RRR,No Gallops,Rubs or new Murmurs, No Parasternal Heave +ve B.Sounds, Abd Soft, mild epigastric tenderness, No organomegaly appriciated, No rebound - guarding or rigidity. No Cyanosis, Clubbing or edema, No new Rash or bruise       Data Review:    CBC Recent Labs  Lab 04/03/19 2238 04/04/19 0702  WBC 11.7* 7.7  HGB 13.0 12.0  HCT 41.6 38.6  PLT 327 283  MCV 91.6 90.8  MCH 28.6 28.2  MCHC 31.3 31.1  RDW 15.1 15.1  LYMPHSABS 5.8* 0.9  MONOABS 1.1* 0.0*  EOSABS 0.0 0.0  BASOSABS 0.0 0.0    Chemistries   Recent Labs  Lab 04/03/19 2238 04/04/19 0702  NA 140 142  K 3.4* 2.9*  CL 102 103  CO2 27 19*  GLUCOSE 129* 240*  BUN 14 14  CREATININE 1.01* 1.14*  CALCIUM 9.3 8.9  MG  --  2.5*  AST 23 31  ALT 20 <5  ALKPHOS 55 49  BILITOT 0.8 0.2*   ------------------------------------------------------------------------------------------------------------------ Recent Labs    04/04/19 0211  TRIG 117    Lab Results  Component Value Date   HGBA1C 5.5 09/08/2017   ------------------------------------------------------------------------------------------------------------------ No results for input(s): TSH, T4TOTAL, T3FREE, THYROIDAB in the last 72 hours.  Invalid input(s): FREET3  Cardiac Enzymes No results for input(s): CKMB, TROPONINI, MYOGLOBIN in the last 168 hours.  Invalid input(s): CK ------------------------------------------------------------------------------------------------------------------    Component Value Date/Time   BNP 28.0 04/03/2019 2238    Micro Results Recent Results (from the past 240 hour(s))  SARS Coronavirus 2 [facility] order, Performed in [facility] hospital lab) Nasopharyngeal Nasopharyngeal Swab     Status: Abnormal   Collection Time: 04/03/19 11:20 PM   Specimen: Nasopharyngeal Swab  Result Value Ref Range Status   SARS Coronavirus 2 POSITIVE (A) NEGATIVE Final    Comment: RESULT CALLED TO, READ BACK BY AND VERIFIED WITH: HARDEN,J @ 0100 ON 04/04/19 BY JUW Performed at [facility], [address] [place], [place] [address]   Blood Culture (routine x 2)     Status: None (Preliminary result)   Collection Time: 04/04/19  1:50 AM   Specimen: Right Antecubital; Blood  Result Value Ref Range Status   Specimen Description RIGHT ANTECUBITAL  Final   Special Requests   Final    BOTTLES DRAWN AEROBIC AND ANAEROBIC Blood Culture adequate volume Performed at [facility], [address] [place], [place] [address]    Culture PENDING  Incomplete    Report Status PENDING  Incomplete  Blood Culture (routine x 2)     Status: None (Preliminary result)   Collection Time: 04/04/19  2:12 AM   Specimen: BLOOD RIGHT FOREARM  Result Value Ref Range Status   Specimen Description BLOOD RIGHT FOREARM  Final   Special Requests   Final    BOTTLES DRAWN AEROBIC AND ANAEROBIC Blood Culture adequate volume Performed at [facility], [address] [place], [place] [address]    Culture PENDING  Incomplete   Report Status PENDING  Incomplete    Radiology Reports Dg Chest Port 1 View  Result Date: 04/04/2019 CLINICAL DATA:  Pt reporting  SOB but improving. O2 sats at 99-100% on RA. Covid + EXAM: PORTABLE CHEST 1 VIEW COMPARISON:  04/03/2019 FINDINGS: Heart size is normal. The lungs are free of focal consolidations and pleural effusions. No pulmonary edema. IMPRESSION: No active disease. Electronically Signed   By: Nolon Nations M.D.   On: 04/04/2019 09:18   Dg Chest Portable 1 View  Result Date: 04/03/2019 CLINICAL DATA:  Shortness of breath EXAM: PORTABLE CHEST 1 VIEW COMPARISON:  July 06, 2018 FINDINGS: The heart size and mediastinal contours are within normal limits. Both lungs are clear. The visualized skeletal structures are unremarkable. IMPRESSION: No acute cardiopulmonary process. Electronically Signed   By: Prudencio Pair M.D.   On: 04/03/2019 23:28

## 2019-04-04 NOTE — Progress Notes (Signed)
Pt arrived from [facility], she is very anxious but after some education on COVID, she is much more relaxed and is reporting improved breathing. O2 sats at 99-100% on RA. Pt reports having fallen at home in past 6 mths so fall precautions implemented. Gilford Rile is in the room. She uses a walker at home. Pt denies pain. No edema noted. Labs will be drawn at 0600 d/t, fluid bolus has just finished at 0500. Call light wtihin reach

## 2019-04-04 NOTE — ED Notes (Signed)
Patient unable to sign transfer consent. Patient gave verbal consent. RN and [facility] witnessed consent form.

## 2019-04-04 NOTE — ED Notes (Signed)
Date and time results received: 04/04/19 0100 (use smartphrase ".now" to insert current time)  Test: COVID Critical Value: Positive  Name of Provider Notified: Rancour  Orders Received? Or Actions Taken?: physician notified.

## 2019-04-04 NOTE — ED Notes (Signed)
Date and time results received: 04/04/19 02:42 (use smartphrase ".now" to insert current time)  Test: Lactic Critical Value: 4.7  Name of Provider Notified: Rancour  Orders Received? Or Actions Taken?: physician notifed

## 2019-04-04 NOTE — Progress Notes (Signed)
Patient's daughter, Elmyra Ricks, was called and updated on patient condition and the plan of care.  All questions welcomed and answered.

## 2019-04-04 NOTE — Plan of Care (Signed)
POC reviewed at length with patient

## 2019-04-04 NOTE — Progress Notes (Signed)
Patient transferred from [facility].  Lactate of 4.7 noted, repeat pending.  Holding the HCTZ, looks like she got 1L bolus in ED.  Will see how repeat looks.

## 2019-04-05 ENCOUNTER — Observation Stay (HOSPITAL_COMMUNITY): Payer: Medicare Other

## 2019-04-05 DIAGNOSIS — Z87891 Personal history of nicotine dependence: Secondary | ICD-10-CM | POA: Diagnosis not present

## 2019-04-05 DIAGNOSIS — M797 Fibromyalgia: Secondary | ICD-10-CM | POA: Diagnosis present

## 2019-04-05 DIAGNOSIS — E785 Hyperlipidemia, unspecified: Secondary | ICD-10-CM | POA: Diagnosis present

## 2019-04-05 DIAGNOSIS — K589 Irritable bowel syndrome without diarrhea: Secondary | ICD-10-CM | POA: Diagnosis not present

## 2019-04-05 DIAGNOSIS — E860 Dehydration: Secondary | ICD-10-CM | POA: Diagnosis present

## 2019-04-05 DIAGNOSIS — J440 Chronic obstructive pulmonary disease with acute lower respiratory infection: Secondary | ICD-10-CM | POA: Diagnosis present

## 2019-04-05 DIAGNOSIS — R0603 Acute respiratory distress: Secondary | ICD-10-CM | POA: Diagnosis present

## 2019-04-05 DIAGNOSIS — I10 Essential (primary) hypertension: Secondary | ICD-10-CM | POA: Diagnosis present

## 2019-04-05 DIAGNOSIS — U071 COVID-19: Secondary | ICD-10-CM | POA: Diagnosis present

## 2019-04-05 DIAGNOSIS — J4540 Moderate persistent asthma, uncomplicated: Secondary | ICD-10-CM | POA: Diagnosis present

## 2019-04-05 DIAGNOSIS — Z79899 Other long term (current) drug therapy: Secondary | ICD-10-CM | POA: Diagnosis not present

## 2019-04-05 DIAGNOSIS — K219 Gastro-esophageal reflux disease without esophagitis: Secondary | ICD-10-CM | POA: Diagnosis present

## 2019-04-05 DIAGNOSIS — Z882 Allergy status to sulfonamides status: Secondary | ICD-10-CM | POA: Diagnosis not present

## 2019-04-05 DIAGNOSIS — E876 Hypokalemia: Secondary | ICD-10-CM | POA: Diagnosis present

## 2019-04-05 DIAGNOSIS — Z833 Family history of diabetes mellitus: Secondary | ICD-10-CM | POA: Diagnosis not present

## 2019-04-05 DIAGNOSIS — Z823 Family history of stroke: Secondary | ICD-10-CM | POA: Diagnosis not present

## 2019-04-05 DIAGNOSIS — Z7951 Long term (current) use of inhaled steroids: Secondary | ICD-10-CM | POA: Diagnosis not present

## 2019-04-05 DIAGNOSIS — A0839 Other viral enteritis: Secondary | ICD-10-CM | POA: Diagnosis present

## 2019-04-05 DIAGNOSIS — Z7952 Long term (current) use of systemic steroids: Secondary | ICD-10-CM | POA: Diagnosis not present

## 2019-04-05 DIAGNOSIS — J1289 Other viral pneumonia: Secondary | ICD-10-CM | POA: Diagnosis present

## 2019-04-05 DIAGNOSIS — M069 Rheumatoid arthritis, unspecified: Secondary | ICD-10-CM | POA: Diagnosis present

## 2019-04-05 DIAGNOSIS — F329 Major depressive disorder, single episode, unspecified: Secondary | ICD-10-CM | POA: Diagnosis present

## 2019-04-05 DIAGNOSIS — Z8249 Family history of ischemic heart disease and other diseases of the circulatory system: Secondary | ICD-10-CM | POA: Diagnosis not present

## 2019-04-05 DIAGNOSIS — Z825 Family history of asthma and other chronic lower respiratory diseases: Secondary | ICD-10-CM | POA: Diagnosis not present

## 2019-04-05 LAB — COMPREHENSIVE METABOLIC PANEL
ALT: 21 U/L (ref 0–44)
AST: 23 U/L (ref 15–41)
Albumin: 4.1 g/dL (ref 3.5–5.0)
Alkaline Phosphatase: 51 U/L (ref 38–126)
Anion gap: 9 (ref 5–15)
BUN: 22 mg/dL (ref 8–23)
CO2: 21 mmol/L — ABNORMAL LOW (ref 22–32)
Calcium: 9.8 mg/dL (ref 8.9–10.3)
Chloride: 114 mmol/L — ABNORMAL HIGH (ref 98–111)
Creatinine, Ser: 0.88 mg/dL (ref 0.44–1.00)
GFR calc Af Amer: >60 mL/min (ref >60)
GFR calc non Af Amer: >60 mL/min (ref >60)
Glucose, Bld: 100 mg/dL — ABNORMAL HIGH (ref 70–99)
Potassium: 4.8 mmol/L (ref 3.5–5.1)
Sodium: 144 mmol/L (ref 135–145)
Total Bilirubin: 0.4 mg/dL (ref 0.3–1.2)
Total Protein: 8.1 g/dL (ref 6.5–8.1)

## 2019-04-05 LAB — D-DIMER, QUANTITATIVE: D-Dimer, Quant: 0.31 ug/mL-FEU (ref 0.00–0.50)

## 2019-04-05 LAB — URINALYSIS, ROUTINE W REFLEX MICROSCOPIC
Bilirubin Urine: NEGATIVE
Glucose, UA: NEGATIVE mg/dL
Hgb urine dipstick: NEGATIVE
Ketones, ur: NEGATIVE mg/dL
Leukocytes,Ua: NEGATIVE
Nitrite: NEGATIVE
Protein, ur: NEGATIVE mg/dL
Specific Gravity, Urine: 1.013 (ref 1.005–1.030)
pH: 5 (ref 5.0–8.0)

## 2019-04-05 LAB — LACTIC ACID, PLASMA: Lactic Acid, Venous: 2.2 mmol/L (ref 0.5–1.9)

## 2019-04-05 LAB — MAGNESIUM: Magnesium: 2.2 mg/dL (ref 1.7–2.4)

## 2019-04-05 LAB — CBC WITH DIFFERENTIAL/PLATELET
Abs Immature Granulocytes: 0.13 10*3/uL — ABNORMAL HIGH (ref 0.00–0.07)
Basophils Absolute: 0.1 10*3/uL (ref 0.0–0.1)
Basophils Relative: 0 %
Eosinophils Absolute: 0 10*3/uL (ref 0.0–0.5)
Eosinophils Relative: 0 %
HCT: 41.9 % (ref 36.0–46.0)
Hemoglobin: 13.3 g/dL (ref 12.0–15.0)
Immature Granulocytes: 1 %
Lymphocytes Relative: 19 %
Lymphs Abs: 4.1 10*3/uL — ABNORMAL HIGH (ref 0.7–4.0)
MCH: 28.7 pg (ref 26.0–34.0)
MCHC: 31.7 g/dL (ref 30.0–36.0)
MCV: 90.5 fL (ref 80.0–100.0)
Monocytes Absolute: 1.6 10*3/uL — ABNORMAL HIGH (ref 0.1–1.0)
Monocytes Relative: 8 %
Neutro Abs: 15.2 10*3/uL — ABNORMAL HIGH (ref 1.7–7.7)
Neutrophils Relative %: 72 %
Platelets: 321 10*3/uL (ref 150–400)
RBC: 4.63 MIL/uL (ref 3.87–5.11)
RDW: 15.6 % — ABNORMAL HIGH (ref 11.5–15.5)
WBC: 21.1 10*3/uL — ABNORMAL HIGH (ref 4.0–10.5)
nRBC: 0 % (ref 0.0–0.2)

## 2019-04-05 LAB — BRAIN NATRIURETIC PEPTIDE: B Natriuretic Peptide: 135.9 pg/mL — ABNORMAL HIGH (ref 0.0–100.0)

## 2019-04-05 LAB — LACTATE DEHYDROGENASE: LDH: 239 U/L — ABNORMAL HIGH (ref 98–192)

## 2019-04-05 LAB — FERRITIN: Ferritin: 130 ng/mL (ref 11–307)

## 2019-04-05 LAB — C-REACTIVE PROTEIN: CRP: <0.8 mg/dL (ref <1.0)

## 2019-04-05 LAB — INTERLEUKIN-6, PLASMA: Interleukin-6, Plasma: 8.6 pg/mL (ref 0.0–12.2)

## 2019-04-05 MED ORDER — HYDRALAZINE HCL 50 MG PO TABS
100.0000 mg | ORAL_TABLET | Freq: Three times a day (TID) | ORAL | Status: DC
  Administered 2019-04-05 – 2019-04-06 (×2): 100 mg via ORAL
  Filled 2019-04-05 (×3): qty 2

## 2019-04-05 MED ORDER — LACTATED RINGERS IV SOLN
INTRAVENOUS | Status: AC
  Administered 2019-04-05: 09:00:00 via INTRAVENOUS

## 2019-04-05 MED ORDER — ISOSORBIDE MONONITRATE ER 30 MG PO TB24
30.0000 mg | ORAL_TABLET | Freq: Every day | ORAL | Status: DC
  Administered 2019-04-05: 09:00:00 30 mg via ORAL
  Filled 2019-04-05 (×2): qty 1

## 2019-04-05 MED ORDER — DEXAMETHASONE SODIUM PHOSPHATE 4 MG/ML IJ SOLN
2.0000 mg | INTRAMUSCULAR | Status: DC
  Administered 2019-04-05 – 2019-04-06 (×2): 2 mg via INTRAVENOUS
  Filled 2019-04-05: qty 1

## 2019-04-05 MED ORDER — LABETALOL HCL 5 MG/ML IV SOLN
10.0000 mg | INTRAVENOUS | Status: DC | PRN
  Administered 2019-04-05: 07:00:00 10 mg via INTRAVENOUS
  Filled 2019-04-05: qty 4

## 2019-04-05 NOTE — Evaluation (Signed)
Physical Therapy Evaluation and Discharge Patient Details Name: Betty Golden MRN: [id number] DOB: 21-May-1946 Today's Date: 04/05/2019   History of Present Illness  73 year old female with H/O hypertension, hyperlipidemia, Gerd, IBS, h/o C. Diff,  Fibromyalgia,  RA, Asthma/ Copd, presented to ER 04/04/19 with chief complaints of nausea vomiting and diarrhea.  She specifically did not have cough or shortness of breath. + Acute Covid 19 Viral Pneumonitis and gastroenteritis. Uses a walker at home and +fall in past 6 mos  Clinical Impression   Patient evaluated by Physical Therapy with no further acute PT needs identified. All education has been completed and the patient has no further questions. She has used a RW for years due to leg pain and is knowledgeable of safe technique.  See below for any follow-up Physical Therapy or equipment needs. PT is signing off. Thank you for this referral.     Follow Up Recommendations No PT follow up    Equipment Recommendations  None recommended by PT    Recommendations for Other Services       Precautions / Restrictions Precautions Precautions: Fall Precaution Comments: reports uses RW vs cane at all times (RW does not go in her bathroom)      Mobility  Bed Mobility Overal bed mobility: Independent                Transfers Overall transfer level: Modified independent Equipment used: Rolling walker (2 wheeled)             General transfer comment: from bed and toilet  Ambulation/Gait Ambulation/Gait assistance: Supervision;Modified independent (Device/Increase time) Gait Distance (Feet): 300 Feet Assistive device: Rolling walker (2 wheeled) Gait Pattern/deviations: Step-through pattern;Decreased stride length;Wide base of support     General Gait Details: pt reports she has used RW for years due to pain  Stairs            Wheelchair Mobility    Modified Rankin (Stroke Patients Only)       Balance Overall  balance assessment: No apparent balance deficits (not formally assessed)                                           Pertinent Vitals/Pain Pain Assessment: Faces Faces Pain Scale: Hurts a little bit Pain Location: "my legs" Pain Descriptors / Indicators: Aching Pain Intervention(s): Limited activity within patient's tolerance;Monitored during session;Repositioned    Home Living Family/patient expects to be discharged to:: Private residence Living Arrangements: Children Available Help at Discharge: Family;Available 24 hours/day Type of Home: House Home Access: Stairs to enter   [employer] of Steps: 2 Home Layout: One level Home Equipment: Walker - 2 wheels;Cane - single point      Prior Function Level of Independence: Needs assistance   Gait / Transfers Assistance Needed: modified independent with RW indoors;   ADL's / Homemaking Assistance Needed: modified independent basic ADLs        Hand Dominance        Extremity/Trunk Assessment   Upper Extremity Assessment Upper Extremity Assessment: Generalized weakness    Lower Extremity Assessment Lower Extremity Assessment: Generalized weakness    Cervical / Trunk Assessment Cervical / Trunk Assessment: Normal  Communication   Communication: No difficulties  Cognition Arousal/Alertness: Awake/alert Behavior During Therapy: WFL for tasks assessed/performed Overall Cognitive Status: Within Functional Limits for tasks assessed  General Comments General comments (skin integrity, edema, etc.): patient very mindful of the cord from the monitor to her pulse ox to prevent trip/fall; sats WNL on room air; HR 102-125 bpm    Exercises     Assessment/Plan    PT Assessment Patent does not need any further PT services  PT Problem List         PT Treatment Interventions      PT Goals (Current goals can be found in the Care Plan section)   Acute Rehab PT Goals PT Goal Formulation: All assessment and education complete, DC therapy    Frequency     Barriers to discharge        Co-evaluation               AM-PAC PT "6 Clicks" Mobility  Outcome Measure Help needed turning from your back to your side while in a flat bed without using bedrails?: None Help needed moving from lying on your back to sitting on the side of a flat bed without using bedrails?: None Help needed moving to and from a bed to a chair (including a wheelchair)?: None Help needed standing up from a chair using your arms (e.g., wheelchair or bedside chair)?: None Help needed to walk in hospital room?: None Help needed climbing 3-5 steps with a railing? : A Little 6 Click Score: 23    End of Session   Activity Tolerance: Patient tolerated treatment well Patient left: in chair;with call bell/phone within reach Nurse Communication: Mobility status;Other (comment)(dc PT) PT Visit Diagnosis: Difficulty in walking, not elsewhere classified (R26.2)    Time: 1212-1238 PT Time Calculation (min) (ACUTE ONLY): 26 min   Charges:   PT Evaluation $PT Eval Low Complexity: 1 Low PT Treatments $Gait Training: 8-22 mins          Barry Brunner, PT      Rexanne Mano 04/05/2019, 4:59 PM

## 2019-04-05 NOTE — Progress Notes (Signed)
PROGRESS NOTE                                                                                                                                                                                                             Patient Demographics:    Betty Golden, is a 73 y.o. female, DOB - 09-27-1945, GGE:[id number]  Outpatient Primary MD for the patient is Quentin Cornwall, MD    LOS - 0  Admit date - 04/03/2019    Chief Complaint  Patient presents with  . Shortness of Breath       Brief Narrative  73 year old female with H/O hypertension, hyperlipidemia, Gerd, IBS, h/o C. Diff,  Fibromyalgia,  RA, Asthma/ Copd, scented to any pain ER with chief complaints of nausea vomiting and diarrhea which started morning of presentation.  She specifically did not have cough or shortness of breath.  In the ER her blood work was consistent with severe dehydration duration, hypokalemia and elevated lactic acid due to dehydration.  She was admitted for further care   Subjective:   Patient in bed, appears comfortable, denies any headache, no fever, no chest pain or pressure, no shortness of breath , no abdominal pain. No focal weakness.   Assessment  & Plan :     1.   Acute Covid 19 Viral Pneumonitis during the ongoing 2020 Covid 19 Pandemic - she is completely symptom-free from pulmonary standpoint, inflammatory markers are stable she will be monitored on low-dose oral steroid.  Stopped all antibiotics.  Leukocytosis likely due to steroids and stress.  Will check UA and repeat chest x-ray and monitor clinically.  Afebrile.  Appears nontoxic and stable.   [place]    04/03/19 2238 04/04/19 0211 04/04/19 0702 04/05/19 0450  DDIMER <0.27  --   --  0.31  FERRITIN  --  129  --  130  LDH  --  170  --  239*  CRP  --  <0.8 <0.8 <0.8    Lab Results  Component Value Date   SARSCOV2NAA POSITIVE (A) 04/03/2019     Hepatic  Function Latest Ref Rng & Units 04/05/2019 04/04/2019 04/03/2019  Total Protein 6.5 - 8.1 g/dL 8.1 7.3 7.7  Albumin 3.5 - 5.0 g/dL 4.1 3.6 4.1  AST 15 - 41 U/L '23 31 23  '$ ALT 0 -  44 U/L 21 <5 20  Alk Phosphatase 38 - 126 U/L 51 49 55  Total Bilirubin 0.3 - 1.2 mg/dL 0.4 0.2(L) 0.8        Component Value Date/Time   BNP 135.9 (H) 04/05/2019 0450      2.  Severe COVID-19 related gastroenteritis causing dehydration, hypokalemia and elevated lactate.  Symptoms have improved, potassium has been aggressively replaced, continue IV fluids as she is still clinically dehydrated recheck lactic acid in the morning.  Much improved..  3.  Epigastric discomfort due to severe nausea vomiting.  GI cocktail and PPI.  4. History of asthma.  Stable.  5.  RA.  Continue outpatient Humira regimen.  6.  Dyslipidemia.  On statin.  7.  Essential hypertension.  In poor control, continue home dose ARB, Norvasc has been added, hydralazine and Imdur added as well for better control.  8.  Fibromyalgia.  Continue combination of Neurontin and duloxetine.    Condition - Guarded  Family Communication  : daughter 04/04/19  Code Status :  Full  Diet :   Diet Order            Diet Heart Room service appropriate? Yes; Fluid consistency: Thin  Diet effective now               Disposition Plan  :  Home in am  Consults  :  None  Procedures  :    PUD Prophylaxis : PPI  DVT Prophylaxis  :  Lovenox   Lab Results  Component Value Date   PLT 321 04/05/2019    Inpatient Medications  Scheduled Meds: . amLODipine  10 mg Oral Daily  . atorvastatin  10 mg Oral q1800  . dexamethasone (DECADRON) injection  4 mg Intravenous Q24H  . DULoxetine  20 mg Oral BID  . enoxaparin (LOVENOX) injection  40 mg Subcutaneous Q24H  . fluticasone  1 puff Inhalation BID  . gabapentin  300 mg Oral TID  . hydrALAZINE  50 mg Oral Q8H  . hydrocortisone   Rectal BID  . losartan  100 mg Oral Daily  . magic mouthwash w/lidocaine   10 mL Oral QID  . pantoprazole  40 mg Oral BID  . psyllium  1 packet Oral QHS  . sodium bicarbonate  650 mg Oral TID   Continuous Infusions: . lactated ringers     PRN Meds:.acetaminophen, albuterol, hydrALAZINE, labetalol  Antibiotics  :    Anti-infectives (From admission, onward)   Start     Dose/Rate Route Frequency Ordered Stop   04/04/19 0230  azithromycin (ZITHROMAX) 500 mg in sodium chloride 0.9 % 250 mL IVPB  Status:  Discontinued     500 mg 250 mL/hr over 60 Minutes Intravenous Every 24 hours 04/04/19 0225 04/04/19 0703       Time Spent in minutes  30   Lala Lund M.D on 04/05/2019 at 8:32 AM  To page go to [email or web address] - password [facility]  [facility] -  Office  [phone]  See all Orders from today for further details    Objective:   Vitals:   04/05/19 0531 04/05/19 0604 04/05/19 0633 04/05/19 0700  BP: (!) 180/83 (!) 192/81 (!) 177/74 (!) 178/73  Pulse:    92  Resp:      Temp:      TempSrc:      SpO2:    100%  Weight:      Height:        Wt Readings from Last 3  Encounters:  04/03/19 74.8 kg  01/12/19 75.3 kg  12/01/18 74.9 kg     Intake/Output Summary (Last 24 hours) at 04/05/2019 0832 Last data filed at 04/05/2019 0300 Gross per 24 hour  Intake 1810.73 ml  Output -  Net 1810.73 ml     Physical Exam  Awake Alert, Oriented X 3, No new F.N deficits, Normal affect Wilder.AT,PERRAL Supple Neck,No JVD, No cervical lymphadenopathy appriciated.  Symmetrical Chest wall movement, Good air movement bilaterally, CTAB RRR,No Gallops, Rubs or new Murmurs, No Parasternal Heave +ve B.Sounds, Abd Soft, No tenderness, No organomegaly appriciated, No rebound - guarding or rigidity. No Cyanosis, Clubbing or edema, No new Rash or bruise    Data Review:    CBC Recent Labs  Lab 04/03/19 2238 04/04/19 0702 04/05/19 0450  WBC 11.7* 7.7 21.1*  HGB 13.0 12.0 13.3  HCT 41.6 38.6 41.9  PLT 327 283 321  MCV 91.6 90.8 90.5  MCH 28.6 28.2 28.7   MCHC 31.3 31.1 31.7  RDW 15.1 15.1 15.6*  LYMPHSABS 5.8* 0.9 4.1*  MONOABS 1.1* 0.0* 1.6*  EOSABS 0.0 0.0 0.0  BASOSABS 0.0 0.0 0.1    Chemistries  Recent Labs  Lab 04/03/19 2238 04/04/19 0702 04/05/19 0450  NA 140 142 144  K 3.4* 2.9* 4.8  CL 102 103 114*  CO2 27 19* 21*  GLUCOSE 129* 240* 100*  BUN '14 14 22  '$ CREATININE 1.01* 1.14* 0.88  CALCIUM 9.3 8.9 9.8  MG  --  2.5* 2.2  AST '23 31 23  '$ ALT 20 <5 21  ALKPHOS 55 49 51  BILITOT 0.8 0.2* 0.4   ------------------------------------------------------------------------------------------------------------------ Recent Labs    04/04/19 0211  TRIG 117    Lab Results  Component Value Date   HGBA1C 5.5 09/08/2017   ------------------------------------------------------------------------------------------------------------------ No results for input(s): TSH, T4TOTAL, T3FREE, THYROIDAB in the last 72 hours.  Invalid input(s): FREET3  Cardiac Enzymes No results for input(s): CKMB, TROPONINI, MYOGLOBIN in the last 168 hours.  Invalid input(s): CK ------------------------------------------------------------------------------------------------------------------    Component Value Date/Time   BNP 135.9 (H) 04/05/2019 0450    Micro Results Recent Results (from the past 240 hour(s))  SARS Coronavirus 2 [facility] order, Performed in [facility] hospital lab) Nasopharyngeal Nasopharyngeal Swab     Status: Abnormal   Collection Time: 04/03/19 11:20 PM   Specimen: Nasopharyngeal Swab  Result Value Ref Range Status   SARS Coronavirus 2 POSITIVE (A) NEGATIVE Final    Comment: RESULT CALLED TO, READ BACK BY AND VERIFIED WITH: HARDEN,J @ 0100 ON 04/04/19 BY JUW Performed at [facility], [address], [place], [place] [address]   Blood Culture (routine x 2)     Status: None (Preliminary result)   Collection Time: 04/04/19  1:50 AM   Specimen: Right Antecubital; Blood  Result Value Ref Range Status   Specimen Description  RIGHT ANTECUBITAL  Final   Special Requests   Final    BOTTLES DRAWN AEROBIC AND ANAEROBIC Blood Culture adequate volume   Culture   Final    NO GROWTH 1 DAY Performed at [facility], [address] [place], [place] [address]    Report Status PENDING  Incomplete  Blood Culture (routine x 2)     Status: None (Preliminary result)   Collection Time: 04/04/19  2:12 AM   Specimen: BLOOD RIGHT FOREARM  Result Value Ref Range Status   Specimen Description BLOOD RIGHT FOREARM  Final   Special Requests   Final    BOTTLES DRAWN AEROBIC  AND ANAEROBIC Blood Culture adequate volume   Culture   Final    NO GROWTH 1 DAY Performed at [facility], [address], [place], [place] [address]    Report Status PENDING  Incomplete    Radiology Reports Dg Chest Port 1 View  Result Date: 04/04/2019 CLINICAL DATA:  Pt reporting SOB but improving. O2 sats at 99-100% on RA. Covid + EXAM: PORTABLE CHEST 1 VIEW COMPARISON:  04/03/2019 FINDINGS: Heart size is normal. The lungs are free of focal consolidations and pleural effusions. No pulmonary edema. IMPRESSION: No active disease. Electronically Signed   By: Nolon Nations M.D.   On: 04/04/2019 09:18   Dg Chest Portable 1 View  Result Date: 04/03/2019 CLINICAL DATA:  Shortness of breath EXAM: PORTABLE CHEST 1 VIEW COMPARISON:  July 06, 2018 FINDINGS: The heart size and mediastinal contours are within normal limits. Both lungs are clear. The visualized skeletal structures are unremarkable. IMPRESSION: No acute cardiopulmonary process. Electronically Signed   By: Prudencio Pair M.D.   On: 04/03/2019 23:28

## 2019-04-06 LAB — CBC WITH DIFFERENTIAL/PLATELET
Abs Immature Granulocytes: 0.08 10*3/uL — ABNORMAL HIGH (ref 0.00–0.07)
Basophils Absolute: 0 10*3/uL (ref 0.0–0.1)
Basophils Relative: 0 %
Eosinophils Absolute: 0 10*3/uL (ref 0.0–0.5)
Eosinophils Relative: 0 %
HCT: 37 % (ref 36.0–46.0)
Hemoglobin: 11.7 g/dL — ABNORMAL LOW (ref 12.0–15.0)
Immature Granulocytes: 1 %
Lymphocytes Relative: 30 %
Lymphs Abs: 4.6 10*3/uL — ABNORMAL HIGH (ref 0.7–4.0)
MCH: 28.7 pg (ref 26.0–34.0)
MCHC: 31.6 g/dL (ref 30.0–36.0)
MCV: 90.7 fL (ref 80.0–100.0)
Monocytes Absolute: 0.7 10*3/uL (ref 0.1–1.0)
Monocytes Relative: 4 %
Neutro Abs: 10.3 10*3/uL — ABNORMAL HIGH (ref 1.7–7.7)
Neutrophils Relative %: 65 %
Platelets: 294 10*3/uL (ref 150–400)
RBC: 4.08 MIL/uL (ref 3.87–5.11)
RDW: 15.7 % — ABNORMAL HIGH (ref 11.5–15.5)
WBC: 15.7 10*3/uL — ABNORMAL HIGH (ref 4.0–10.5)
nRBC: 0 % (ref 0.0–0.2)

## 2019-04-06 LAB — COMPREHENSIVE METABOLIC PANEL
ALT: 18 U/L (ref 0–44)
AST: 18 U/L (ref 15–41)
Albumin: 3.8 g/dL (ref 3.5–5.0)
Alkaline Phosphatase: 45 U/L (ref 38–126)
Anion gap: 7 (ref 5–15)
BUN: 18 mg/dL (ref 8–23)
CO2: 22 mmol/L (ref 22–32)
Calcium: 9.3 mg/dL (ref 8.9–10.3)
Chloride: 111 mmol/L (ref 98–111)
Creatinine, Ser: 0.76 mg/dL (ref 0.44–1.00)
GFR calc Af Amer: >60 mL/min (ref >60)
GFR calc non Af Amer: >60 mL/min (ref >60)
Glucose, Bld: 110 mg/dL — ABNORMAL HIGH (ref 70–99)
Potassium: 3.6 mmol/L (ref 3.5–5.1)
Sodium: 140 mmol/L (ref 135–145)
Total Bilirubin: 0.5 mg/dL (ref 0.3–1.2)
Total Protein: 6.9 g/dL (ref 6.5–8.1)

## 2019-04-06 LAB — D-DIMER, QUANTITATIVE: D-Dimer, Quant: <0.27 ug/mL-FEU (ref 0.00–0.50)

## 2019-04-06 LAB — URINE CULTURE

## 2019-04-06 LAB — C-REACTIVE PROTEIN: CRP: <0.8 mg/dL (ref <1.0)

## 2019-04-06 LAB — BRAIN NATRIURETIC PEPTIDE: B Natriuretic Peptide: 73.2 pg/mL (ref 0.0–100.0)

## 2019-04-06 LAB — LACTIC ACID, PLASMA: Lactic Acid, Venous: 1.9 mmol/L (ref 0.5–1.9)

## 2019-04-06 MED ORDER — CARVEDILOL 3.125 MG PO TABS
6.2500 mg | ORAL_TABLET | Freq: Two times a day (BID) | ORAL | Status: DC
  Administered 2019-04-06: 6.25 mg via ORAL
  Filled 2019-04-06: qty 2

## 2019-04-06 MED ORDER — HYDRALAZINE HCL 100 MG PO TABS: 100.0000 mg | ORAL_TABLET | Freq: Three times a day (TID) | ORAL | 0 refills | Status: DC

## 2019-04-06 MED ORDER — ISOSORBIDE MONONITRATE ER 30 MG PO TB24: 30.0000 mg | ORAL_TABLET | Freq: Every day | ORAL | 0 refills | Status: DC

## 2019-04-06 MED ORDER — ISOSORBIDE MONONITRATE ER 30 MG PO TB24
30.0000 mg | ORAL_TABLET | Freq: Every day | ORAL | Status: DC
  Administered 2019-04-06: 30 mg via ORAL

## 2019-04-06 MED ORDER — AMLODIPINE BESYLATE 10 MG PO TABS: 10.0000 mg | ORAL_TABLET | Freq: Every day | ORAL | 0 refills | Status: DC

## 2019-04-06 MED ORDER — CARVEDILOL 6.25 MG PO TABS: 6.2500 mg | ORAL_TABLET | Freq: Two times a day (BID) | ORAL | 0 refills | Status: DC

## 2019-04-06 MED ORDER — ISOSORBIDE MONONITRATE ER 30 MG PO TB24: 60.0000 mg | ORAL_TABLET | Freq: Every day | ORAL | Status: DC

## 2019-04-06 MED ORDER — PANTOPRAZOLE SODIUM 40 MG PO TBEC: 40.0000 mg | DELAYED_RELEASE_TABLET | Freq: Every day | ORAL | 0 refills | Status: DC

## 2019-04-06 NOTE — Discharge Instructions (Signed)
Follow with Primary MD Quentin Cornwall, MD in 7 days   Get CBC, CMP, 2 view Chest X ray -  checked next visit within 1 week by Primary MD   Activity: As tolerated with Full fall precautions use walker/cane & assistance as needed  Disposition Home   Diet: Heart Healthy    Special Instructions: If you have smoked or chewed Tobacco  in the last 2 yrs please stop smoking, stop any regular Alcohol  and or any Recreational drug use.  On your next visit with your primary care physician please Get Medicines reviewed and adjusted.  Please request your Prim.MD to go over all Hospital Tests and Procedure/Radiological results at the follow up, please get all Hospital records sent to your Prim MD by signing hospital release before you go home.  If you experience worsening of your admission symptoms, develop shortness of breath, life threatening emergency, suicidal or homicidal thoughts you must seek medical attention immediately by calling 911 or calling your MD immediately  if symptoms less severe.  You Must read complete instructions/literature along with all the possible adverse reactions/side effects for all the Medicines you take and that have been prescribed to you. Take any new Medicines after you have completely understood and accpet all the possible adverse reactions/side effects.         Person Under Monitoring Name: Betty Golden  Location: [place] [address]   Infection Prevention Recommendations for Individuals Confirmed to have, or Being Evaluated for, 2019 Novel Coronavirus (COVID-19) Infection Who Receive Care at Home  Individuals who are confirmed to have, or are being evaluated for, COVID-19 should follow the prevention steps below until a healthcare provider or local or state health department says they can return to normal activities.  Stay home except to get medical care You should restrict activities outside your home, except for getting medical  care. Do not go to work, school, or public areas, and do not use public transportation or taxis.  Call ahead before visiting your doctor Before your medical appointment, call the healthcare provider and tell them that you have, or are being evaluated for, COVID-19 infection. This will help the healthcare providers office take steps to keep other people from getting infected. Ask your healthcare provider to call the local or state health department.  Monitor your symptoms Seek prompt medical attention if your illness is worsening (e.g., difficulty breathing). Before going to your medical appointment, call the healthcare provider and tell them that you have, or are being evaluated for, COVID-19 infection. Ask your healthcare provider to call the local or state health department.  Wear a facemask You should wear a facemask that covers your nose and mouth when you are in the same room with other people and when you visit a healthcare provider. People who live with or visit you should also wear a facemask while they are in the same room with you.  Separate yourself from other people in your home As much as possible, you should stay in a different room from other people in your home. Also, you should use a separate bathroom, if available.  Avoid sharing household items You should not share dishes, drinking glasses, cups, eating utensils, towels, bedding, or other items with other people in your home. After using these items, you should wash them thoroughly with soap and water.  Cover your coughs and sneezes Cover your mouth and nose with a tissue when you cough or sneeze, or you can  cough or sneeze into your sleeve. Throw used tissues in a lined trash can, and immediately wash your hands with soap and water for at least 20 seconds or use an alcohol-based hand rub.  Wash your [facility] your hands often and thoroughly with soap and water for at least 20 seconds. You can use an alcohol-based  hand sanitizer if soap and water are not available and if your hands are not visibly dirty. Avoid touching your eyes, nose, and mouth with unwashed hands.   Prevention Steps for Caregivers and Household Members of Individuals Confirmed to have, or Being Evaluated for, COVID-19 Infection Being Cared for in the Home  If you live with, or provide care at home for, a person confirmed to have, or being evaluated for, COVID-19 infection please follow these guidelines to prevent infection:  Follow healthcare providers instructions Make sure that you understand and can help the patient follow any healthcare provider instructions for all care.  Provide for the patients basic needs You should help the patient with basic needs in the home and provide support for getting groceries, prescriptions, and other personal needs.  Monitor the patients symptoms If they are getting sicker, call his or her medical provider and tell them that the patient has, or is being evaluated for, COVID-19 infection. This will help the healthcare providers office take steps to keep other people from getting infected. Ask the healthcare provider to call the local or state health department.  Limit the number of people who have contact with the patient  If possible, have only one caregiver for the patient.  Other household members should stay in another home or place of residence. If this is not possible, they should stay  in another room, or be separated from the patient as much as possible. Use a separate bathroom, if available.  Restrict visitors who do not have an essential need to be in the home.  Keep older adults, very young children, and other sick people away from the patient Keep older adults, very young children, and those who have compromised immune systems or chronic health conditions away from the patient. This includes people with chronic heart, lung, or kidney conditions, diabetes, and  cancer.  Ensure good ventilation Make sure that shared spaces in the home have good air flow, such as from an air conditioner or an opened window, weather permitting.  Wash your hands often  Wash your hands often and thoroughly with soap and water for at least 20 seconds. You can use an alcohol based hand sanitizer if soap and water are not available and if your hands are not visibly dirty.  Avoid touching your eyes, nose, and mouth with unwashed hands.  Use disposable paper towels to dry your hands. If not available, use dedicated cloth towels and replace them when they become wet.  Wear a facemask and gloves  Wear a disposable facemask at all times in the room and gloves when you touch or have contact with the patients blood, body fluids, and/or secretions or excretions, such as sweat, saliva, sputum, nasal mucus, vomit, urine, or feces.  Ensure the mask fits over your nose and mouth tightly, and do not touch it during use.  Throw out disposable facemasks and gloves after using them. Do not reuse.  Wash your hands immediately after removing your facemask and gloves.  If your personal clothing becomes contaminated, carefully remove clothing and launder. Wash your hands after handling contaminated clothing.  Place all used disposable facemasks, gloves, and  other waste in a lined container before disposing them with other household waste.  Remove gloves and wash your hands immediately after handling these items.  Do not share dishes, glasses, or other household items with the patient  Avoid sharing household items. You should not share dishes, drinking glasses, cups, eating utensils, towels, bedding, or other items with a patient who is confirmed to have, or being evaluated for, COVID-19 infection.  After the person uses these items, you should wash them thoroughly with soap and water.  Wash laundry thoroughly  Immediately remove and wash clothes or bedding that have blood, body  fluids, and/or secretions or excretions, such as sweat, saliva, sputum, nasal mucus, vomit, urine, or feces, on them.  Wear gloves when handling laundry from the patient.  Read and follow directions on labels of laundry or clothing items and detergent. In general, wash and dry with the warmest temperatures recommended on the label.  Clean all areas the individual has used often  Clean all touchable surfaces, such as counters, tabletops, doorknobs, bathroom fixtures, toilets, phones, keyboards, tablets, and bedside tables, every day. Also, clean any surfaces that may have blood, body fluids, and/or secretions or excretions on them.  Wear gloves when cleaning surfaces the patient has come in contact with.  Use a diluted bleach solution (e.g., dilute bleach with 1 part bleach and 10 parts water) or a household disinfectant with a label that says EPA-registered for coronaviruses. To make a bleach solution at home, add 1 tablespoon of bleach to 1 quart (4 cups) of water. For a larger supply, add  cup of bleach to 1 gallon (16 cups) of water.  Read labels of cleaning products and follow recommendations provided on product labels. Labels contain instructions for safe and effective use of the cleaning product including precautions you should take when applying the product, such as wearing gloves or eye protection and making sure you have good ventilation during use of the product.  Remove gloves and wash hands immediately after cleaning.  Monitor yourself for signs and symptoms of illness Caregivers and household members are considered close contacts, should monitor their health, and will be asked to limit movement outside of the home to the extent possible. Follow the monitoring steps for close contacts listed on the symptom monitoring form.   ? If you have additional questions, contact your local health department or call the epidemiologist on call at [phone] (available 24/7). ? This  guidance is subject to change. For the most up-to-date guidance from [facility], please refer to their website: [email or web address]

## 2019-04-06 NOTE — Discharge Summary (Signed)
Betty Golden:[id number] DOB: [birth year] DOA: 04/03/2019  PCP: Quentin Cornwall, MD  Admit date: 04/03/2019  Discharge date: 04/06/2019  Admitted From: Home   Disposition:  Home   Recommendations for Outpatient Follow-up:   Follow up with PCP in 1-2 weeks  PCP Please obtain BMP/CBC, 2 view CXR in 1week,  (see Discharge instructions)   PCP Please follow up on the following pending results: Monitor BP   Home Health: None   Equipment/Devices: None  Consultations: None  Discharge Condition: Stable    CODE STATUS: Full    Diet Recommendation: Heart Healthy     Chief Complaint  Patient presents with   Shortness of Breath     Brief history of present illness from the day of admission and additional interim summary    73 year old female with H/O hypertension, hyperlipidemia,Gerd, IBS, h/o C. Diff, Fibromyalgia, RA, Asthma/ Copd, scented to any pain ER with chief complaints of nausea vomiting and diarrhea which started morning of presentation.  She specifically did not have cough or shortness of breath.  In the ER her blood work was consistent with severe dehydration duration, hypokalemia and elevated lactic acid due to dehydration.  She was admitted for further care                                                                 Hospital Course    1.   Acute Covid 19 Viral Pneumonitis during the ongoing 2020 Covid 19 Pandemic - she is completely symptom-free from pulmonary standpoint, inflammatory markers were stable and she was treated with low-dose steroids only.  He needs to have no symptoms whatsoever from pulmonary standpoint, did have GI symptoms which have also completely resolved.Marland Kitchen  COVID-19 Labs  Recent Labs    04/03/19 2238  04/04/19 0211 04/04/19 0702 04/05/19 0450 04/06/19 0410  DDIMER  <0.27  --   --   --  0.31 <0.27  FERRITIN  --   --  129  --  130  --   LDH  --   --  170  --  239*  --   CRP  --    < > <0.8 <0.8 <0.8 <0.8   < > = values in this interval not displayed.    Lab Results  Component Value Date   SARSCOV2NAA POSITIVE (A) 04/03/2019     2.  Severe COVID-19 related gastroenteritis causing dehydration, hypokalemia and elevated lactate.  With supportive care and aggressive hydration with IV fluids all symptoms have resolved, lactic acid back to normal will be discharged home.  3.  Epigastric discomfort due to severe nausea vomiting.    Solved after being on PPI, will be discharged on PPI.  4. History of asthma.  Stable.  5. RA.  Continue outpatient Humira regimen.  6.  Dyslipidemia.  On statin.  7.  Essential hypertension.    Is an extremely poor control now she has been placed on combination of beta-blocker, ARB, Norvasc and hydralazine along with Imdur, PCP to monitor and adjust medications as needed.  8.  Fibromyalgia.  Continue combination of Neurontin and duloxetine.       Discharge diagnosis     Principal Problem:   COVID-19 virus infection Active Problems:   Essential hypertension   Hypokalemia   Dyspnea    Discharge instructions    Discharge Instructions    Diet - low sodium heart healthy   Complete by: As directed    Discharge instructions   Complete by: As directed    Follow with Primary MD Zachery Dauer, MD in 7 days   Get CBC, CMP, 2 view Chest X ray -  checked next visit within 1 week by Primary MD   Activity: As tolerated with Full fall precautions use walker/cane & assistance as needed  Disposition Home   Diet: Heart Healthy    Special Instructions: If you have smoked or chewed Tobacco  in the last 2 yrs please stop smoking, stop any regular Alcohol  and or any Recreational drug use.  On your next visit with your primary care physician please Get Medicines reviewed and adjusted.  Please request your  Prim.MD to go over all Hospital Tests and Procedure/Radiological results at the follow up, please get all Hospital records sent to your Prim MD by signing hospital release before you go home.  If you experience worsening of your admission symptoms, develop shortness of breath, life threatening emergency, suicidal or homicidal thoughts you must seek medical attention immediately by calling 911 or calling your MD immediately  if symptoms less severe.  You Must read complete instructions/literature along with all the possible adverse reactions/side effects for all the Medicines you take and that have been prescribed to you. Take any new Medicines after you have completely understood and accpet all the possible adverse reactions/side effects.   Increase activity slowly   Complete by: As directed       Discharge Medications   Allergies as of 04/06/2019      Reactions   Sulfa Antibiotics Rash      Medication List    TAKE these medications   albuterol 108 (90 Base) MCG/ACT inhaler Commonly known as: ProAir HFA Inhale 2 puffs into the lungs every 4 (four) hours as needed for wheezing or shortness of breath.   amLODipine 10 MG tablet Commonly known as: NORVASC Take 1 tablet (10 mg total) by mouth daily. Start taking on: April 07, 2019   Benefiber Drink Mix Pack Take 4 g by mouth at bedtime.   carvedilol 6.25 MG tablet Commonly known as: COREG Take 1 tablet (6.25 mg total) by mouth 2 (two) times daily with a meal.   DULoxetine 20 MG capsule Commonly known as: CYMBALTA Take 20 mg by mouth 2 (two) times daily.   Fluticasone Furoate 100 MCG/ACT Aepb Commonly known as: Arnuity Ellipta Inhale 1 Dose into the lungs daily.   folic acid 1 MG tablet Commonly known as: FOLVITE Take 1 mg by mouth daily.   gabapentin 300 MG capsule Commonly known as: NEURONTIN Take 900-1,200 mg by mouth See admin instructions. Taking 3 capsules  (900mg ) in the morning and (1200mg ) 4 capsules in the  evening.   Humira 40 MG/0.4ML Pskt Generic drug: Adalimumab Inject 40 mg into the skin every 14 (fourteen) days.   hydrALAZINE 100  MG tablet Commonly known as: APRESOLINE Take 1 tablet (100 mg total) by mouth every 8 (eight) hours.   hydrocortisone 2.5 % rectal cream Commonly known as: ANUSOL-HC Place rectally 2 (two) times daily.   isosorbide mononitrate 30 MG 24 hr tablet Commonly known as: IMDUR Take 1 tablet (30 mg total) by mouth daily. Start taking on: April 07, 2019   losartan-hydrochlorothiazide 100-12.5 MG tablet Commonly known as: HYZAAR Take 1 tablet by mouth daily.   methotrexate 2.5 MG tablet Commonly known as: RHEUMATREX Take 7.5 mg by mouth once a week. On Thursday   multivitamin capsule Take 1 capsule by mouth daily.   pantoprazole 40 MG tablet Commonly known as: PROTONIX Take 1 tablet (40 mg total) by mouth daily.   simvastatin 20 MG tablet Commonly known as: ZOCOR Take 20 mg by mouth at bedtime.       Follow-up Information    Zachery DauerMilam, James T, MD. Schedule an appointment as soon as possible for a visit in 1 week(s).   Specialty: Family Medicine Contact information: [address], [address] [place] [id number] [phone]           Major procedures and Radiology Reports - PLEASE review detailed and final reports thoroughly  -         Dg Chest Port 1 View  Result Date: 04/05/2019 CLINICAL DATA:  Shortness of breath.  COVID-19 virus infection. EXAM: PORTABLE CHEST 1 VIEW COMPARISON:  04/04/2019 FINDINGS: The heart size and mediastinal contours are within normal limits. Both lungs are clear. The visualized skeletal structures are unremarkable. IMPRESSION: No active disease. Electronically Signed   By: Danae OrleansJohn A Stahl M.D.   On: 04/05/2019 08:36   Dg Chest Port 1 View  Result Date: 04/04/2019 CLINICAL DATA:  Pt reporting SOB but improving. O2 sats at 99-100% on RA. Covid + EXAM: PORTABLE CHEST 1 VIEW COMPARISON:  04/03/2019 FINDINGS: Heart  size is normal. The lungs are free of focal consolidations and pleural effusions. No pulmonary edema. IMPRESSION: No active disease. Electronically Signed   By: Norva PavlovElizabeth  Brown M.D.   On: 04/04/2019 09:18   Dg Chest Portable 1 View  Result Date: 04/03/2019 CLINICAL DATA:  Shortness of breath EXAM: PORTABLE CHEST 1 VIEW COMPARISON:  July 06, 2018 FINDINGS: The heart size and mediastinal contours are within normal limits. Both lungs are clear. The visualized skeletal structures are unremarkable. IMPRESSION: No acute cardiopulmonary process. Electronically Signed   By: Jonna ClarkBindu  Avutu M.D.   On: 04/03/2019 23:28    Micro Results     Recent Results (from the past 240 hour(s))  SARS Coronavirus 2 [facility](Hospital order, Performed in [facility] lab) Nasopharyngeal Nasopharyngeal Swab     Status: Abnormal   Collection Time: 04/03/19 11:20 PM   Specimen: Nasopharyngeal Swab  Result Value Ref Range Status   SARS Coronavirus 2 POSITIVE (A) NEGATIVE Final    Comment: RESULT CALLED TO, READ BACK BY AND VERIFIED WITH: HARDEN,J @ 0100 ON 04/04/19 BY JUW Performed at [facility], [address] [place], [place] [address]   Blood Culture (routine x 2)     Status: None (Preliminary result)   Collection Time: 04/04/19  1:50 AM   Specimen: Right Antecubital; Blood  Result Value Ref Range Status   Specimen Description RIGHT ANTECUBITAL  Final   Special Requests   Final    BOTTLES DRAWN AEROBIC AND ANAEROBIC Blood Culture adequate volume   Culture   Final    NO GROWTH 2 DAYS Performed at [facility], [address], [place], [place] [address]    Report Status PENDING  Incomplete  Blood Culture (routine x 2)     Status: None (Preliminary result)   Collection Time: 04/04/19  2:12 AM   Specimen: BLOOD RIGHT FOREARM  Result Value Ref Range Status   Specimen Description BLOOD RIGHT FOREARM  Final   Special Requests   Final    BOTTLES DRAWN AEROBIC AND ANAEROBIC Blood Culture adequate volume   Culture    Final    NO GROWTH 2 DAYS Performed at [facility], [address] [place], [place] [address]    Report Status PENDING  Incomplete    Today   Subjective    Allura Doepke today has no headache,no chest abdominal pain,no new weakness tingling or numbness, feels much better wants to go home today.     Objective   Blood pressure (!) 159/92, pulse 84, temperature 97.7 F (36.5 C), temperature source Oral, resp. rate 18, height 5\' 3"  (1.6 m), weight 74.8 kg, SpO2 100 %.  No intake or output data in the 24 hours ending 04/06/19 0849  Exam Awake Alert, Oriented x 3, No new F.N deficits, Normal affect [place].AT,PERRAL Supple Neck,No JVD, No cervical lymphadenopathy appriciated.  Symmetrical Chest wall movement, Good air movement bilaterally, CTAB RRR,No Gallops,Rubs or new Murmurs, No Parasternal Heave +ve B.Sounds, Abd Soft, Non tender, No organomegaly appriciated, No rebound -guarding or rigidity. No Cyanosis, Clubbing or edema, No new Rash or bruise   Data Review   CBC w Diff:  Lab Results  Component Value Date   WBC 15.7 (H) 04/06/2019   HGB 11.7 (L) 04/06/2019   HCT 37.0 04/06/2019   PLT 294 04/06/2019   LYMPHOPCT 30 04/06/2019   MONOPCT 4 04/06/2019   EOSPCT 0 04/06/2019   BASOPCT 0 04/06/2019    CMP:  Lab Results  Component Value Date   NA 140 04/06/2019   NA 143 09/08/2017   K 3.6 04/06/2019   CL 111 04/06/2019   CO2 22 04/06/2019   BUN 18 04/06/2019   BUN 18 09/08/2017   CREATININE 0.76 04/06/2019   PROT 6.9 04/06/2019   PROT 7.5 09/08/2017   ALBUMIN 3.8 04/06/2019   BILITOT 0.5 04/06/2019   ALKPHOS 45 04/06/2019   AST 18 04/06/2019   ALT 18 04/06/2019  .   Total Time in preparing paper work, data evaluation and todays exam - 35 minutes  06/06/2019 M.D on 04/06/2019 at 8:49 AM  [facility]   Office  [phone]

## 2019-04-07 ENCOUNTER — Other Ambulatory Visit: Payer: Self-pay

## 2019-04-07 ENCOUNTER — Ambulatory Visit: Payer: Medicare Other | Admitting: Allergy & Immunology

## 2019-04-07 ENCOUNTER — Emergency Department (HOSPITAL_COMMUNITY)
Admission: EM | Admit: 2019-04-07 | Discharge: 2019-04-07 | Disposition: A | Discharge status: Home / Self Care | Payer: Medicare Other | Attending: Emergency Medicine | Admitting: Emergency Medicine

## 2019-04-07 ENCOUNTER — Encounter (HOSPITAL_COMMUNITY): Payer: Self-pay

## 2019-04-07 ENCOUNTER — Emergency Department (HOSPITAL_COMMUNITY): Payer: Medicare Other

## 2019-04-07 DIAGNOSIS — I259 Chronic ischemic heart disease, unspecified: Secondary | ICD-10-CM | POA: Diagnosis not present

## 2019-04-07 DIAGNOSIS — R0602 Shortness of breath: Secondary | ICD-10-CM | POA: Diagnosis not present

## 2019-04-07 DIAGNOSIS — W19XXXA Unspecified fall, initial encounter: Secondary | ICD-10-CM | POA: Insufficient documentation

## 2019-04-07 DIAGNOSIS — S92341A Displaced fracture of fourth metatarsal bone, right foot, initial encounter for closed fracture: Secondary | ICD-10-CM | POA: Insufficient documentation

## 2019-04-07 DIAGNOSIS — I10 Essential (primary) hypertension: Secondary | ICD-10-CM | POA: Insufficient documentation

## 2019-04-07 DIAGNOSIS — J449 Chronic obstructive pulmonary disease, unspecified: Secondary | ICD-10-CM | POA: Diagnosis not present

## 2019-04-07 DIAGNOSIS — S92331A Displaced fracture of third metatarsal bone, right foot, initial encounter for closed fracture: Secondary | ICD-10-CM | POA: Insufficient documentation

## 2019-04-07 DIAGNOSIS — Z79899 Other long term (current) drug therapy: Secondary | ICD-10-CM | POA: Insufficient documentation

## 2019-04-07 DIAGNOSIS — J45909 Unspecified asthma, uncomplicated: Secondary | ICD-10-CM | POA: Insufficient documentation

## 2019-04-07 DIAGNOSIS — S92811A Other fracture of right foot, initial encounter for closed fracture: Secondary | ICD-10-CM

## 2019-04-07 DIAGNOSIS — R55 Syncope and collapse: Secondary | ICD-10-CM

## 2019-04-07 DIAGNOSIS — Y999 Unspecified external cause status: Secondary | ICD-10-CM | POA: Insufficient documentation

## 2019-04-07 DIAGNOSIS — E860 Dehydration: Secondary | ICD-10-CM | POA: Diagnosis not present

## 2019-04-07 DIAGNOSIS — Y939 Activity, unspecified: Secondary | ICD-10-CM | POA: Insufficient documentation

## 2019-04-07 DIAGNOSIS — Z87891 Personal history of nicotine dependence: Secondary | ICD-10-CM | POA: Insufficient documentation

## 2019-04-07 DIAGNOSIS — Y92019 Unspecified place in single-family (private) house as the place of occurrence of the external cause: Secondary | ICD-10-CM | POA: Insufficient documentation

## 2019-04-07 DIAGNOSIS — S92321A Displaced fracture of second metatarsal bone, right foot, initial encounter for closed fracture: Secondary | ICD-10-CM | POA: Insufficient documentation

## 2019-04-07 LAB — URINALYSIS, ROUTINE W REFLEX MICROSCOPIC
Bilirubin Urine: NEGATIVE
Glucose, UA: NEGATIVE mg/dL
Hgb urine dipstick: NEGATIVE
Ketones, ur: NEGATIVE mg/dL
Leukocytes,Ua: NEGATIVE
Nitrite: NEGATIVE
Protein, ur: NEGATIVE mg/dL
Specific Gravity, Urine: 1.026 (ref 1.005–1.030)
pH: 6 (ref 5.0–8.0)

## 2019-04-07 LAB — BASIC METABOLIC PANEL
Anion gap: 10 (ref 5–15)
BUN: 16 mg/dL (ref 8–23)
CO2: 23 mmol/L (ref 22–32)
Calcium: 8.8 mg/dL — ABNORMAL LOW (ref 8.9–10.3)
Chloride: 103 mmol/L (ref 98–111)
Creatinine, Ser: 0.89 mg/dL (ref 0.44–1.00)
GFR calc Af Amer: >60 mL/min (ref >60)
GFR calc non Af Amer: >60 mL/min (ref >60)
Glucose, Bld: 91 mg/dL (ref 70–99)
Potassium: 3.4 mmol/L — ABNORMAL LOW (ref 3.5–5.1)
Sodium: 136 mmol/L (ref 135–145)

## 2019-04-07 LAB — HEPATIC FUNCTION PANEL
ALT: 18 U/L (ref 0–44)
AST: 17 U/L (ref 15–41)
Albumin: 3.6 g/dL (ref 3.5–5.0)
Alkaline Phosphatase: 48 U/L (ref 38–126)
Bilirubin, Direct: 0.1 mg/dL (ref 0.0–0.2)
Indirect Bilirubin: 0.3 mg/dL (ref 0.3–0.9)
Total Bilirubin: 0.4 mg/dL (ref 0.3–1.2)
Total Protein: 6.8 g/dL (ref 6.5–8.1)

## 2019-04-07 LAB — CBC
HCT: 35.3 % — ABNORMAL LOW (ref 36.0–46.0)
Hemoglobin: 11.3 g/dL — ABNORMAL LOW (ref 12.0–15.0)
MCH: 29 pg (ref 26.0–34.0)
MCHC: 32 g/dL (ref 30.0–36.0)
MCV: 90.5 fL (ref 80.0–100.0)
Platelets: 251 10*3/uL (ref 150–400)
RBC: 3.9 MIL/uL (ref 3.87–5.11)
RDW: 15.9 % — ABNORMAL HIGH (ref 11.5–15.5)
WBC: 16.5 10*3/uL — ABNORMAL HIGH (ref 4.0–10.5)
nRBC: 0 % (ref 0.0–0.2)

## 2019-04-07 LAB — LIPASE, BLOOD: Lipase: 20 U/L (ref 11–51)

## 2019-04-07 MED ORDER — TRAMADOL HCL 50 MG PO TABS: 50.0000 mg | ORAL_TABLET | Freq: Four times a day (QID) | ORAL | 0 refills | Status: DC | PRN

## 2019-04-07 MED ORDER — SODIUM CHLORIDE 0.9 % IV BOLUS
1000.0000 mL | Freq: Once | INTRAVENOUS | Status: AC
  Administered 2019-04-07: 1000 mL via INTRAVENOUS

## 2019-04-07 MED ORDER — ONDANSETRON HCL 4 MG PO TABS: 4.0000 mg | ORAL_TABLET | Freq: Three times a day (TID) | ORAL | 0 refills | Status: DC | PRN

## 2019-04-07 MED ORDER — ONDANSETRON HCL 4 MG/2ML IJ SOLN
4.0000 mg | Freq: Once | INTRAMUSCULAR | Status: AC
  Administered 2019-04-07: 4 mg via INTRAVENOUS
  Filled 2019-04-07: qty 2

## 2019-04-07 MED ORDER — SODIUM CHLORIDE 0.9% FLUSH: 3.0000 mL | Freq: Once | INTRAVENOUS | Status: DC

## 2019-04-07 MED ORDER — IOHEXOL 350 MG/ML SOLN
100.0000 mL | Freq: Once | INTRAVENOUS | Status: AC | PRN
  Administered 2019-04-07: 14:00:00 100 mL via INTRAVENOUS

## 2019-04-07 NOTE — ED Notes (Signed)
Pt c/o R foot pain. EDP notified.

## 2019-04-07 NOTE — ED Notes (Signed)
Pt given ginger ale.

## 2019-04-07 NOTE — ED Notes (Signed)
Patient transported to CT 

## 2019-04-07 NOTE — Discharge Instructions (Signed)
You have passed out due to ongoing dehydration.  Stay hydrated at home.  Take antinausea medication as needed.  You also broke your right foot from the fall.  Call and follow up with orthopedist next week for further care.  Return if you have any concerns.

## 2019-04-07 NOTE — ED Triage Notes (Signed)
Pt brought to ED via [facility]. Pt had syncope episode this morning. Pt tested + covid on 9/5 and was discharged from [facility] on 9/7.

## 2019-04-07 NOTE — ED Notes (Signed)
Pt was able to void 400 ml.

## 2019-04-07 NOTE — ED Provider Notes (Signed)
[facility] EMERGENCY DEPARTMENT Provider Note   CSN: [id number] Arrival date & time: 04/07/19  1150     History   Chief Complaint Chief Complaint  Patient presents with  . Near Syncope    HPI Betty Golden is a 73 y.o. female.     The history is provided by the patient and medical records. No language interpreter was used.  Near Syncope     73 year old female with history of COPD, asthma, previous smoker, fibromyalgia, rheumatoid arthritis, recently hospitalized to [facility] for positive COVID on 04/03/2019, discharged yesterday to home presents today with complaints of near syncope.  Per recent note, patient did not have any cough or shortness of breath however did test positive for viral pneumonitis secondary to COVID-19.  She was found to have severe dehydration secondary to nausea vomiting diarrhea and was managed with IV fluid and symptomatic treatment.  Patient states she was discharged home yesterday however she still endorse having abdominal discomfort with associated nausea and having loose stools.  She still endorse feeling weak and today states that she had a syncopal episode.  She fell down to the ground but denies any injury aside from mild discomfort to her right ankle.  Does not complain of any headache.  She still endorsed having pain in her chest and shortness of breath which is similar to symptoms during hospitalization.  Endorsed occasional cough.  She denies any recent sick contact that she can recall.  She lives at home with family.  Past Medical History:  Diagnosis Date  . Arthritis   . Asthma   . C. difficile colitis   . Cervical stenosis of spine   . Chronic headaches   . Chronic neck pain   . Coronary artery disease   . Depression   . Diverticulosis   . Essential hypertension   . Fibromyalgia   . GERD (gastroesophageal reflux disease)   . History of Salmonella gastroenteritis   . HNP (herniated nucleus pulposus), lumbar   .  Hyperlipidemia   . IBS (irritable bowel syndrome)   . Lung nodule     Patient Active Problem List   Diagnosis Date Noted  . Dyspnea 04/04/2019  . COVID-19 virus infection 04/04/2019  . Gastritis and gastroduodenitis 07/08/2018  . Rectal bleeding 06/29/2018  . Fibromyalgia 09/08/2017  . Spinal stenosis of lumbar region 09/20/2015  . 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 Moderate persistent asthma 07/03/2015  . Allergic rhinitis 07/03/2015  . Colitis due to Clostridium difficile 02/10/2015  . Abdominal pain 02/10/2015  . Nausea and vomiting 02/10/2015  . Acute renal failure ([place]) 02/10/2015  . Dehydration 02/10/2015  . Hypoxia 02/10/2015  . Hyponatremia 02/10/2015  . Hypokalemia 02/10/2015  . COPD (chronic obstructive pulmonary disease) ([place])   . Unspecified constipation 07/15/2013  . DYSPNEA 04/05/2008  . Essential hypertension 03/10/2008  . Headache(784.0) 03/10/2008  . Cough 03/10/2008    Past Surgical History:  Procedure Laterality Date  . APPENDECTOMY  1965  . BIOPSY  08/26/2018   Procedure: BIOPSY;  Surgeon: Rogene Houston, MD;  Location: [facility];  Service: Endoscopy;;  gastric  . BREAST BIOPSY    . BREAST REDUCTION SURGERY  1994  . CHOLECYSTECTOMY  1975  . COLONOSCOPY N/A 08/19/2013   Procedure: COLONOSCOPY;  Surgeon: Rogene Houston, MD;  Location: [facility];  Service: Endoscopy;  Laterality: N/A;  155-moved to 140 Ann to notify pt  . COLONOSCOPY N/A 08/26/2018   Procedure: COLONOSCOPY;  Surgeon: Rogene Houston,  MD;  Location: [facility];  Service: Endoscopy;  Laterality: N/A;  . ESOPHAGOGASTRODUODENOSCOPY N/A 08/26/2018   Procedure: ESOPHAGOGASTRODUODENOSCOPY (EGD);  Surgeon: Malissa Hippoehman, Najeeb U, MD;  Location: [facility];  Service: Endoscopy;  Laterality: N/A;  . fibromyalgia    . LUMBAR LAMINECTOMY/DECOMPRESSION MICRODISCECTOMY Bilateral 09/20/2015   Procedure: MICRO LUMBAR DECOMPRESSION L5-S1 BILATERAL    (1 LEVEL);  Surgeon: Jene EveryJeffrey Beane, MD;  Location:  [facility];  Service: Orthopedics;  Laterality: Bilateral;  . rheumatoid arthritis    . Sinus Surgery    . TONSILLECTOMY  1968     OB History   No obstetric history on file.      Home Medications    Prior to Admission medications   Medication Sig Start Date End Date Taking? Authorizing Provider  albuterol (PROAIR HFA) 108 (90 Base) MCG/ACT inhaler Inhale 2 puffs into the lungs every 4 (four) hours as needed for wheezing or shortness of breath. 10/07/18   Alfonse SpruceGallagher, Joel Louis, MD  amLODipine (NORVASC) 10 MG tablet Take 1 tablet (10 mg total) by mouth daily. 04/07/19   Leroy SeaSingh, Prashant K, MD  carvedilol (COREG) 6.25 MG tablet Take 1 tablet (6.25 mg total) by mouth 2 (two) times daily with a meal. 04/06/19   Leroy SeaSingh, Prashant K, MD  DULoxetine (CYMBALTA) 20 MG capsule Take 20 mg by mouth 2 (two) times daily.    [provider]  Fluticasone Furoate (ARNUITY ELLIPTA) 100 MCG/ACT AEPB Inhale 1 Dose into the lungs daily. 10/07/18   Alfonse SpruceGallagher, Joel Louis, MD  folic acid (FOLVITE) 1 MG tablet Take 1 mg by mouth daily. 03/03/19   [provider]  gabapentin (NEURONTIN) 300 MG capsule Take 900-1,200 mg by mouth See admin instructions. Taking 3 capsules  (900mg ) in the morning and (1200mg ) 4 capsules in the evening. 06/24/18   [provider]  HUMIRA 40 MG/0.4ML PSKT Inject 40 mg into the skin every 14 (fourteen) days.  09/18/18   [provider]  hydrALAZINE (APRESOLINE) 100 MG tablet Take 1 tablet (100 mg total) by mouth every 8 (eight) hours. 04/06/19   Leroy SeaSingh, Prashant K, MD  hydrocortisone (ANUSOL-HC) 2.5 % rectal cream Place rectally 2 (two) times daily. 12/01/18   Setzer, Brand Maleserri L, NP  isosorbide mononitrate (IMDUR) 30 MG 24 hr tablet Take 1 tablet (30 mg total) by mouth daily. 04/07/19   Leroy SeaSingh, Prashant K, MD  losartan-hydrochlorothiazide (HYZAAR) 100-12.5 MG per tablet Take 1 tablet by mouth daily.    [provider]  methotrexate (RHEUMATREX) 2.5 MG tablet Take 7.5 mg  by mouth once a week. On Thursday 03/30/19   [provider]  Multiple Vitamin (MULTIVITAMIN) capsule Take 1 capsule by mouth daily.    [provider]  pantoprazole (PROTONIX) 40 MG tablet Take 1 tablet (40 mg total) by mouth daily. 04/06/19   Leroy SeaSingh, Prashant K, MD  simvastatin (ZOCOR) 20 MG tablet Take 20 mg by mouth at bedtime.     [provider]  Wheat Dextrin (BENEFIBER DRINK MIX) PACK Take 4 g by mouth at bedtime. 08/26/18   Malissa Hippoehman, Najeeb U, MD    Family History Family History  Problem Relation Age of Onset  . Heart disease Mother   . Hypertension Mother   . Asthma Mother   . Heart disease Father   . Stroke Father   . Hypertension Father   . Stroke Sister   . Asthma Sister   . Hypertension Brother   . Asthma Brother   . Asthma Maternal Grandmother   .  Heart disease Maternal Grandmother   . Diabetes Maternal Grandfather   . Asthma Maternal Aunt   . Colon cancer Neg Hx     Social History Social History   Tobacco Use  . Smoking status: Former Smoker    Packs/day: 2.00    Years: 10.00    Pack years: 20.00    Types: Cigarettes    Quit date: 09/11/1988    Years since quitting: 30.5  . Smokeless tobacco: Never Used  . Tobacco comment: quit 1990  Substance Use Topics  . Alcohol use: No    Comment: stopped in 1990  . Drug use: No    Comment: hx smoking marijuana and cocaine weekends. Stopped in the 1990s.     Allergies   Sulfa antibiotics   Review of Systems Review of Systems  Cardiovascular: Positive for near-syncope.  All other systems reviewed and are negative.    Physical Exam Updated Vital Signs BP 123/64 (BP Location: Right Arm)   Pulse 78   Temp 98.6 F (37 C) (Oral)   Resp 18   Ht 5\' 5"  (1.651 m)   Wt 74.8 kg   SpO2 100%   BMI 27.46 kg/m   Physical Exam Vitals signs and nursing note reviewed.  Constitutional:      General: She is not in acute distress.    Appearance: She is well-developed.  HENT:     Head:  Atraumatic.  Eyes:     Conjunctiva/sclera: Conjunctivae normal.  Neck:     Musculoskeletal: Neck supple.  Cardiovascular:     Rate and Rhythm: Normal rate and regular rhythm.     Heart sounds: Murmur present.  Pulmonary:     Effort: Pulmonary effort is normal.     Breath sounds: Normal breath sounds. No wheezing, rhonchi or rales.  Abdominal:     Palpations: Abdomen is soft.     Tenderness: There is abdominal tenderness (mild diffused tenderness).  Musculoskeletal:        General: Tenderness (R foot: tenderness to dorsum of foot laterally with faint bruising noted.  DP 2+, R ankle nontender) present. No swelling.  Skin:    Findings: No rash.  Neurological:     Mental Status: She is alert and oriented to person, place, and time.  Psychiatric:        Mood and Affect: Mood normal.      ED Treatments / Results  Labs (all labs ordered are listed, but only abnormal results are displayed) Labs Reviewed  BASIC METABOLIC PANEL - Abnormal; Notable for the following components:      Result Value   Potassium 3.4 (*)    Calcium 8.8 (*)    All other components within normal limits  CBC - Abnormal; Notable for the following components:   WBC 16.5 (*)    Hemoglobin 11.3 (*)    HCT 35.3 (*)    RDW 15.9 (*)    All other components within normal limits  URINALYSIS, ROUTINE W REFLEX MICROSCOPIC - Abnormal; Notable for the following components:   Color, Urine STRAW (*)    All other components within normal limits  HEPATIC FUNCTION PANEL  LIPASE, BLOOD    EKG EKG Interpretation  Date/Time:  Wednesday April 07 2019 11:59:40 EDT Ventricular Rate:  79 PR Interval:    QRS Duration: 96 QT Interval:  372 QTC Calculation: 427 R Axis:   48 Text Interpretation:  Sinus rhythm Borderline T wave abnormalities Baseline wander in lead(s) II III aVF No STEMI  Confirmed by  Alona Bene ([id number]) on 04/07/2019 12:18:59 PM   Radiology Ct Angio Chest Pe W And/or Wo Contrast  Result Date:  04/07/2019 CLINICAL DATA:  Syncope, shortness of breath EXAM: CT ANGIOGRAPHY CHEST WITH CONTRAST TECHNIQUE: Multidetector CT imaging of the chest was performed using the standard protocol during bolus administration of intravenous contrast. Multiplanar CT image reconstructions and MIPs were obtained to evaluate the vascular anatomy. CONTRAST:  OMNIPAQUE IOHEXOL 350 MG/ML SOLN COMPARISON:  Chest x-ray 04/05/2019 FINDINGS: Cardiovascular: Satisfactory opacification of the pulmonary arteries to the segmental level. No evidence of pulmonary embolism. Normal heart size. No pericardial effusion. Mediastinum/Nodes: No enlarged mediastinal, hilar, or axillary lymph nodes. Thyroid gland, trachea, and esophagus demonstrate no significant findings. Lungs/Pleura: There are scattered areas of ground-glass opacity in a predominantly peripheral distribution of the bilateral lungs. No pleural effusion or pneumothorax. Upper Abdomen: No acute abnormality. Musculoskeletal: Multilevel degenerative changes of the thoracic spine. No acute osseous abnormality. No focal chest wall abnormality. Review of the MIP images confirms the above findings. IMPRESSION: 1. Negative for pulmonary embolism. 2. Scattered areas of ground-glass opacity throughout both lungs in a predominantly peripheral distribution suggesting an atypical or viral pneumonia. Electronically Signed   By: Duanne Guess M.D.   On: 04/07/2019 14:23   Dg Foot Complete Right  Result Date: 04/07/2019 CLINICAL DATA:  Right foot pain EXAM: RIGHT FOOT COMPLETE - 3+ VIEW COMPARISON:  04/12/2008 FINDINGS: Acute mildly displaced fractures of the second, third, and fourth metatarsal necks. The metatarsal heads are slightly inferolaterally displaced. Fractures appear extra-articular. There is no dislocation. Alignment at the Lisfranc joint is anatomic. Soft tissue swelling overlies the fracture sites. Bidirectional calcaneal enthesophytes. Mild first MTP joint OA. IMPRESSION:  Acute mildly displaced fractures of the second, third, and fourth metatarsal necks. Electronically Signed   By: Duanne Guess M.D.   On: 04/07/2019 17:27    Procedures Procedures (including critical care time)  Medications Ordered in ED Medications  sodium chloride flush (NS) 0.9 % injection 3 mL (3 mLs Intravenous Not Given 04/07/19 1220)  sodium chloride 0.9 % bolus 1,000 mL (0 mLs Intravenous Stopped 04/07/19 1454)  ondansetron (ZOFRAN) injection 4 mg (4 mg Intravenous Given 04/07/19 1251)  iohexol (OMNIPAQUE) 350 MG/ML injection 100 mL (100 mLs Intravenous Contrast Given 04/07/19 1349)  sodium chloride 0.9 % bolus 1,000 mL (0 mLs Intravenous Stopped 04/07/19 1610)  sodium chloride 0.9 % bolus 1,000 mL (0 mLs Intravenous Stopped 04/07/19 1830)     Initial Impression / Assessment and Plan / ED Course  I have reviewed the triage vital signs and the nursing notes.  Pertinent labs & imaging results that were available during my care of the patient were reviewed by me and considered in my medical decision making (see chart for details).        BP (!) 160/73   Pulse 92   Temp 98.6 F (37 C) (Oral)   Resp 16   Ht 5\' 5"  (1.651 m)   Wt 74.8 kg   SpO2 96%   BMI 27.46 kg/m    Final Clinical Impressions(s) / ED Diagnoses   Final diagnoses:  Syncope and collapse  Dehydration  Other fracture of right foot, initial encounter for closed fracture    ED Discharge Orders         Ordered    traMADol (ULTRAM) 50 MG tablet  Every 6 hours PRN     04/07/19 1931    ondansetron (ZOFRAN) 4 MG tablet  Every 8 hours PRN  04/07/19 1931         12:40 PM Pt was recently hospitalized for nausea vomiting diarrhea and positive COVID-19 test, discharged yesterday after 3 days stays in the hospital.  She still endorse the same symptoms that she exhibits during hospitalization and today felt weak and had a syncopal episode.  No significant signs of injury from her syncope.  Suspect dehydration  causing weakness.  She has had 3 separate chest x-ray during the hospitalization without any acute finding.  Her lungs are clear on exam, O2 sats is at 100%.  I have low suspicion for PE causing her symptoms.  Work-up initiated, IV fluid given.  Will monitor closely.  4:22 PM Patient has positive orthostatic vital sign, she had been receiving IV fluid.  Her labs are mostly reassuring, elevated WBC of 16.5 mildly elevated from prior.  Minimal abdominal discomfort on exam, chest CT angiogram without evidence of PE but does shows groundglass opacity consistent with viral infection.  Patient able to tolerate p.o.  At this time, I felt patient is stable for discharge as she is not hypoxic pain is controlled, pt can eat and drink.  She does have R foot pain, will order xray.   7:27 PM Xray demonstrates acute mildly displaced fractures of the second, third and fourth metatarsal necks.  This is a closed injury.  Pt is NVI.  Short leg splint and crutches provided.  Pt to f/u with orthopedist for further care.  At this time pt is ready to be discharge.  Return precaution given.    Fayrene Helper, PA-C 04/07/19 1931    Long, Arlyss Repress, MD 04/08/19 [id number]

## 2019-04-07 NOTE — ED Notes (Signed)
Pt was able to drink and keep down gingerale.

## 2019-04-07 NOTE — ED Notes (Signed)
Stood pt up for orthostatics and found that pt had been incontinent of stool.  Cleaned pt, changed bed linens and got blankets for pt.  Pt resting comfortably at this time.

## 2019-04-09 LAB — CULTURE, BLOOD (ROUTINE X 2)
Culture: NO GROWTH
Culture: NO GROWTH
Special Requests: ADEQUATE
Special Requests: ADEQUATE

## 2019-04-16 ENCOUNTER — Emergency Department (HOSPITAL_COMMUNITY): Payer: Medicare Other

## 2019-04-16 ENCOUNTER — Inpatient Hospital Stay (HOSPITAL_COMMUNITY)
Admission: EM | Admit: 2019-04-16 | Discharge: 2019-04-21 | DRG: 177 | Disposition: A | Discharge status: Home / Self Care | Payer: Medicare Other | Attending: Internal Medicine | Admitting: Internal Medicine

## 2019-04-16 DIAGNOSIS — Z79891 Long term (current) use of opiate analgesic: Secondary | ICD-10-CM

## 2019-04-16 DIAGNOSIS — Z823 Family history of stroke: Secondary | ICD-10-CM

## 2019-04-16 DIAGNOSIS — K589 Irritable bowel syndrome without diarrhea: Secondary | ICD-10-CM | POA: Diagnosis present

## 2019-04-16 DIAGNOSIS — J1289 Other viral pneumonia: Secondary | ICD-10-CM | POA: Diagnosis present

## 2019-04-16 DIAGNOSIS — M797 Fibromyalgia: Secondary | ICD-10-CM | POA: Diagnosis present

## 2019-04-16 DIAGNOSIS — N179 Acute kidney failure, unspecified: Secondary | ICD-10-CM | POA: Diagnosis present

## 2019-04-16 DIAGNOSIS — I10 Essential (primary) hypertension: Secondary | ICD-10-CM | POA: Diagnosis present

## 2019-04-16 DIAGNOSIS — R29711 NIHSS score 11: Secondary | ICD-10-CM | POA: Diagnosis present

## 2019-04-16 DIAGNOSIS — G459 Transient cerebral ischemic attack, unspecified: Secondary | ICD-10-CM | POA: Diagnosis not present

## 2019-04-16 DIAGNOSIS — R4781 Slurred speech: Secondary | ICD-10-CM

## 2019-04-16 DIAGNOSIS — R296 Repeated falls: Secondary | ICD-10-CM | POA: Diagnosis present

## 2019-04-16 DIAGNOSIS — I2510 Atherosclerotic heart disease of native coronary artery without angina pectoris: Secondary | ICD-10-CM | POA: Diagnosis present

## 2019-04-16 DIAGNOSIS — R29810 Facial weakness: Secondary | ICD-10-CM | POA: Diagnosis present

## 2019-04-16 DIAGNOSIS — G8929 Other chronic pain: Secondary | ICD-10-CM | POA: Diagnosis present

## 2019-04-16 DIAGNOSIS — H9191 Unspecified hearing loss, right ear: Secondary | ICD-10-CM | POA: Diagnosis present

## 2019-04-16 DIAGNOSIS — M069 Rheumatoid arthritis, unspecified: Secondary | ICD-10-CM | POA: Diagnosis present

## 2019-04-16 DIAGNOSIS — E785 Hyperlipidemia, unspecified: Secondary | ICD-10-CM | POA: Diagnosis present

## 2019-04-16 DIAGNOSIS — R4701 Aphasia: Secondary | ICD-10-CM | POA: Diagnosis present

## 2019-04-16 DIAGNOSIS — Z882 Allergy status to sulfonamides status: Secondary | ICD-10-CM

## 2019-04-16 DIAGNOSIS — Z9049 Acquired absence of other specified parts of digestive tract: Secondary | ICD-10-CM

## 2019-04-16 DIAGNOSIS — J129 Viral pneumonia, unspecified: Secondary | ICD-10-CM | POA: Diagnosis not present

## 2019-04-16 DIAGNOSIS — Z79899 Other long term (current) drug therapy: Secondary | ICD-10-CM

## 2019-04-16 DIAGNOSIS — U071 COVID-19: Secondary | ICD-10-CM | POA: Diagnosis not present

## 2019-04-16 DIAGNOSIS — J440 Chronic obstructive pulmonary disease with acute lower respiratory infection: Secondary | ICD-10-CM | POA: Diagnosis present

## 2019-04-16 DIAGNOSIS — I472 Ventricular tachycardia: Secondary | ICD-10-CM | POA: Diagnosis not present

## 2019-04-16 DIAGNOSIS — Z825 Family history of asthma and other chronic lower respiratory diseases: Secondary | ICD-10-CM

## 2019-04-16 DIAGNOSIS — E876 Hypokalemia: Secondary | ICD-10-CM | POA: Diagnosis present

## 2019-04-16 DIAGNOSIS — E875 Hyperkalemia: Secondary | ICD-10-CM | POA: Diagnosis not present

## 2019-04-16 DIAGNOSIS — K219 Gastro-esophageal reflux disease without esophagitis: Secondary | ICD-10-CM | POA: Diagnosis present

## 2019-04-16 DIAGNOSIS — F329 Major depressive disorder, single episode, unspecified: Secondary | ICD-10-CM | POA: Diagnosis present

## 2019-04-16 DIAGNOSIS — Z87891 Personal history of nicotine dependence: Secondary | ICD-10-CM

## 2019-04-16 DIAGNOSIS — Z8619 Personal history of other infectious and parasitic diseases: Secondary | ICD-10-CM

## 2019-04-16 DIAGNOSIS — J9601 Acute respiratory failure with hypoxia: Secondary | ICD-10-CM | POA: Diagnosis present

## 2019-04-16 DIAGNOSIS — Z833 Family history of diabetes mellitus: Secondary | ICD-10-CM

## 2019-04-16 DIAGNOSIS — T148XXA Other injury of unspecified body region, initial encounter: Secondary | ICD-10-CM

## 2019-04-16 DIAGNOSIS — Z794 Long term (current) use of insulin: Secondary | ICD-10-CM

## 2019-04-16 DIAGNOSIS — J449 Chronic obstructive pulmonary disease, unspecified: Secondary | ICD-10-CM | POA: Diagnosis present

## 2019-04-16 DIAGNOSIS — Z8249 Family history of ischemic heart disease and other diseases of the circulatory system: Secondary | ICD-10-CM

## 2019-04-16 DIAGNOSIS — R339 Retention of urine, unspecified: Secondary | ICD-10-CM | POA: Diagnosis not present

## 2019-04-16 LAB — CBC
HCT: 38 % (ref 36.0–46.0)
Hemoglobin: 12.2 g/dL (ref 12.0–15.0)
MCH: 28 pg (ref 26.0–34.0)
MCHC: 32.1 g/dL (ref 30.0–36.0)
MCV: 87.4 fL (ref 80.0–100.0)
Platelets: 290 10*3/uL (ref 150–400)
RBC: 4.35 MIL/uL (ref 3.87–5.11)
RDW: 15.4 % (ref 11.5–15.5)
WBC: 10.6 10*3/uL — ABNORMAL HIGH (ref 4.0–10.5)
nRBC: 0 % (ref 0.0–0.2)

## 2019-04-16 LAB — PROTIME-INR
INR: 1.1 (ref 0.8–1.2)
Prothrombin Time: 14.1 seconds (ref 11.4–15.2)

## 2019-04-16 LAB — COMPREHENSIVE METABOLIC PANEL
ALT: 39 U/L (ref 0–44)
AST: 66 U/L — ABNORMAL HIGH (ref 15–41)
Albumin: 3.1 g/dL — ABNORMAL LOW (ref 3.5–5.0)
Alkaline Phosphatase: 41 U/L (ref 38–126)
Anion gap: 13 (ref 5–15)
BUN: 33 mg/dL — ABNORMAL HIGH (ref 8–23)
CO2: 26 mmol/L (ref 22–32)
Calcium: 8.8 mg/dL — ABNORMAL LOW (ref 8.9–10.3)
Chloride: 94 mmol/L — ABNORMAL LOW (ref 98–111)
Creatinine, Ser: 1.52 mg/dL — ABNORMAL HIGH (ref 0.44–1.00)
GFR calc Af Amer: 40 mL/min — ABNORMAL LOW (ref >60)
GFR calc non Af Amer: 34 mL/min — ABNORMAL LOW (ref >60)
Glucose, Bld: 114 mg/dL — ABNORMAL HIGH (ref 70–99)
Potassium: 3.1 mmol/L — ABNORMAL LOW (ref 3.5–5.1)
Sodium: 133 mmol/L — ABNORMAL LOW (ref 135–145)
Total Bilirubin: 0.5 mg/dL (ref 0.3–1.2)
Total Protein: 7.7 g/dL (ref 6.5–8.1)

## 2019-04-16 LAB — DIFFERENTIAL
Abs Immature Granulocytes: 0.04 10*3/uL (ref 0.00–0.07)
Basophils Absolute: 0 10*3/uL (ref 0.0–0.1)
Basophils Relative: 0 %
Eosinophils Absolute: 0 10*3/uL (ref 0.0–0.5)
Eosinophils Relative: 0 %
Immature Granulocytes: 0 %
Lymphocytes Relative: 28 %
Lymphs Abs: 3 10*3/uL (ref 0.7–4.0)
Monocytes Absolute: 0.5 10*3/uL (ref 0.1–1.0)
Monocytes Relative: 4 %
Neutro Abs: 7.1 10*3/uL (ref 1.7–7.7)
Neutrophils Relative %: 68 %

## 2019-04-16 LAB — APTT: aPTT: 31 seconds (ref 24–36)

## 2019-04-16 LAB — ETHANOL: Alcohol, Ethyl (B): <10 mg/dL (ref <10)

## 2019-04-16 LAB — SARS CORONAVIRUS 2 BY RT PCR (HOSPITAL ORDER, PERFORMED IN ~~LOC~~ HOSPITAL LAB): SARS Coronavirus 2: POSITIVE — AB

## 2019-04-16 MED ORDER — IOHEXOL 350 MG/ML SOLN
125.0000 mL | Freq: Once | INTRAVENOUS | Status: AC | PRN
  Administered 2019-04-16: 125 mL via INTRAVENOUS

## 2019-04-16 NOTE — ED Notes (Signed)
Dr Darrick Meigs at bedside Pt daughter at bedside.

## 2019-04-16 NOTE — ED Notes (Signed)
ED Provider at bedside. 

## 2019-04-16 NOTE — Consult Note (Signed)
[facility] Tele[facility]rology Consult Services   Date of Service:   04/16/2019 17:59:31  Impression:     .  Rule Out Acute Ischemic Stroke     .  Right Hemispheric Infarct  Comments/Sign-Out: altered mentation, slurred speech, and left facial droop- ?right hemispheric stroke. Exam is difficult due to altered mentation and cooperation. Other than the facial droop, her exam is mostly nonfocal. Language exam with the language cards is difficult to assess due to altered mentation and she says she cannot see without her glasses but no clear aphasia is seen.  Mechanism of Stroke: Not Clear  Metrics: Last Known Well: 04/16/2019 07:30:00 [facility] Notification Time: 04/16/2019 17:59:31 Arrival Time: 04/16/2019 17:52:00 Stamp Time: 04/16/2019 17:59:31 Time First Login Attempt: 04/16/2019 18:05:37 Video Start Time: 04/16/2019 18:05:37  Symptoms: facial droop, slurred speech NIHSS Start Assessment Time: 04/16/2019 18:11:00 Patient is not a candidate for Alteplase/Activase. Patient was not deemed candidate for Alteplase/Activase thrombolytics because of last known normal unknown- called family x 4 and could not get in touch with them to obtain history and screen for contraindications. Video End Time: 04/16/2019 18:39:24  CT head showed no acute hemorrhage or acute core infarct.  Lower Likelihood of Large Vessel Occlusion but Following Stat Studies are Recommended  CTA Head and Neck. CT Perfusion. Reviewed (radiologist's read is pending), No Indication of Large Vessel Occlusive Thrombus, Patient is not an NIR Candidate if radiologist agrees with my review.   ED Physician notified of diagnostic impression and management plan on 04/16/2019 18:39:41  Our recommendations are outlined below.  Recommendations:     .  Activate Stroke Protocol Admission/Order Set     .  Stroke/Telemetry Floor     .  Neuro Checks     .  Bedside Swallow Eval     .  DVT Prophylaxis      .  IV Fluids, Normal Saline     .  Head of Bed 30 Degrees     .  Euglycemia and Avoid Hyperthermia (PRN Acetaminophen)     .  Antiplatelet Therapy Recommended  Routine Consultation with Inhouse Neurology for Follow up Care  Sign Out:     .  Discussed with Emergency Department Provider    ------------------------------------------------------------------------------  History of Present Illness: Patient is a 73 year old Female.  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 She was diagnosed with COVID on 04/03/2019 . On 04/07/2019, she fell and hit her head. She states she has pain everywhere. She cannot provide much history due to her altered mentation . I spoke to the daughter who stated that the symptoms began at 7:30 AM this morning and have been constant since. At baseline, she walks wih a rolling walker. She broke either her right leg or toes on the right foot on 04/07/2019. She is complaining of a headache.    Past Medical History:     . Hypertension     . Hyperlipidemia     . Coronary Artery Disease      Examination: 1A: Level of Consciousness - Requires repeated stimulation to arouse + 2 1B: Ask Month and Age - Both Questions Right + 0 1C: Blink Eyes & Squeeze Hands - Performs Both Tasks + 0 2: Test Horizontal Extraocular Movements - Normal + 0 3: Test Visual Fields - No Visual  Loss + 0 4: Test Facial Palsy (Use Grimace if Obtunded) - Normal symmetry + 0 5A: Test Left Arm Motor Drift - Drift, but doesn't hit bed + 1 5B: Test Right Arm Motor Drift - Drift, but doesn't hit bed + 1 6A: Test Left Leg Motor Drift - No Effort Against Gravity + 3 6B: Test Right Leg Motor Drift - No Effort Against Gravity + 3 7: Test Limb Ataxia (FNF/Heel-Shin) - No Ataxia + 0 8: Test Sensation - Normal; No sensory loss +  0 9: Test Language/Aphasia - Normal; No aphasia + 0 10: Test Dysarthria - Mild-Moderate Dysarthria: Slurring but can be understood + 1 11: Test Extinction/Inattention - No abnormality + 0  NIHSS Score: 11  Patient/Family was informed the Neurology Consult would happen via TeleHealth consult by way of interactive audio and video telecommunications and consented to receiving care in this manner.   Due to the immediate potential for life-threatening deterioration due to underlying acute neurologic illness, I spent 35 minutes providing critical care. This time includes time for face to face visit via telemedicine, review of medical records, imaging studies and discussion of findings with providers, the patient and/or family.   Dr Uvaldo Bristle   [facility] [phone]  Case [id number]

## 2019-04-16 NOTE — ED Provider Notes (Signed)
[facility] EMERGENCY DEPARTMENT Provider Note   CSN: [id number] Arrival date & time: 04/16/19  1752  An emergency department physician performed an initial assessment on this suspected stroke patient at 1753.  History   Chief Complaint Chief Complaint  Patient presents with   Code Stroke    HPI Betty Golden is a 73 y.o. female.     The history is provided by the EMS personnel, the patient and a relative. The history is limited by the condition of the patient (acuity of condition, slurred speech).    Pt was seen at 1750. Per EMS and family report: Pt's family noticed pt had slurred speech and right sided facial droop since 1400 PTA. EMS states pt has c/o ongoing headache since fall 04/07/19: pt was evaluated at that time and dx right foot fracture. No reported new falls, no focal motor weakness, no vomiting/diarrhea.  Pt discharged from hospital 04/06/19 for dehydration due to N/V/D, +COVID.   Past Medical History:  Diagnosis Date   Arthritis    Asthma    C. difficile colitis    Cervical stenosis of spine    Chronic headaches    Chronic neck pain    Coronary artery disease    Depression    Diverticulosis    Essential hypertension    Fibromyalgia    GERD (gastroesophageal reflux disease)    History of Salmonella gastroenteritis    HNP (herniated nucleus pulposus), lumbar    Hyperlipidemia    IBS (irritable bowel syndrome)    Lung nodule     Patient Active Problem List   Diagnosis Date Noted   Dyspnea 04/04/2019   COVID-19 virus infection 04/04/2019   Gastritis and gastroduodenitis 07/08/2018   Rectal bleeding 06/29/2018   Fibromyalgia 09/08/2017   Spinal stenosis of lumbar region 09/20/2015   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/03/2015   Moderate persistent asthma 07/03/2015   Allergic rhinitis 07/03/2015   Colitis due to Clostridium difficile 02/10/2015   Abdominal pain 02/10/2015   Nausea and vomiting 02/10/2015   Acute renal failure (HCC)  02/10/2015   Dehydration 02/10/2015   Hypoxia 02/10/2015   Hyponatremia 02/10/2015   Hypokalemia 02/10/2015   COPD (chronic obstructive pulmonary disease) (HCC)    Unspecified constipation 07/15/2013   DYSPNEA 04/05/2008   Essential hypertension 03/10/2008   Headache(784.0) 03/10/2008   Cough 03/10/2008    Past Surgical History:  Procedure Laterality Date   APPENDECTOMY  1965   BIOPSY  08/26/2018   Procedure: BIOPSY;  Surgeon: Malissa Hippo, MD;  Location: [facility];  Service: Endoscopy;;  gastric   BREAST BIOPSY     BREAST REDUCTION SURGERY  1994   CHOLECYSTECTOMY  1975   COLONOSCOPY N/A 08/19/2013   Procedure: COLONOSCOPY;  Surgeon: Malissa Hippo, MD;  Location: [facility];  Service: Endoscopy;  Laterality: N/A;  155-moved to 140 Ann to notify pt   COLONOSCOPY N/A 08/26/2018   Procedure: COLONOSCOPY;  Surgeon: Malissa Hippo, MD;  Location: [facility];  Service: Endoscopy;  Laterality: N/A;   ESOPHAGOGASTRODUODENOSCOPY N/A 08/26/2018   Procedure: ESOPHAGOGASTRODUODENOSCOPY (EGD);  Surgeon: Malissa Hippo, MD;  Location: [facility];  Service: Endoscopy;  Laterality: N/A;   fibromyalgia     LUMBAR LAMINECTOMY/DECOMPRESSION MICRODISCECTOMY Bilateral 09/20/2015   Procedure: MICRO LUMBAR DECOMPRESSION L5-S1 BILATERAL    (1 LEVEL);  Surgeon: [name], MD;  Location: [facility];  Service: Orthopedics;  Laterality: Bilateral;   rheumatoid arthritis     Sinus Surgery     TONSILLECTOMY  1968     OB History   No obstetric history on file.      Home Medications    Prior to Admission medications   Medication Sig Start Date End Date Taking? Authorizing Provider  albuterol (PROAIR HFA) 108 (90 Base) MCG/ACT inhaler Inhale 2 puffs into the lungs every 4 (four) hours as needed for wheezing or shortness of breath. 10/07/18   Alfonse Spruce, MD  amLODipine (NORVASC) 10 MG tablet Take 1 tablet (10 mg total) by mouth daily. 04/07/19   Leroy Sea, MD  carvedilol (COREG) 6.25 MG tablet Take 1 tablet (6.25 mg total) by mouth 2 (two) times daily with a meal. 04/06/19   Leroy Sea, MD  DULoxetine (CYMBALTA) 20 MG capsule Take 20 mg by mouth 2 (two) times daily.    [provider]  Fluticasone Furoate (ARNUITY ELLIPTA) 100 MCG/ACT AEPB Inhale 1 Dose into the lungs daily. 10/07/18   Alfonse Spruce, MD  folic acid (FOLVITE) 1 MG tablet Take 1 mg by mouth daily. 03/03/19   [provider]  gabapentin (NEURONTIN) 300 MG capsule Take 900-1,200 mg by mouth See admin instructions. Taking 3 capsules  ( ) in the morning and ( ) 4 capsules in the evening. 06/24/18   [provider]  HUMIRA 40 MG/0.4ML PSKT Inject 40 mg into the skin every 14 (fourteen) days.  09/18/18   [provider]  hydrALAZINE (APRESOLINE) 100 MG tablet Take 1 tablet (100 mg total) by mouth every 8 (eight) hours. 04/06/19   Leroy Sea, MD  hydrocortisone (ANUSOL-HC) 2.5 % rectal cream Place rectally 2 (two) times daily. 12/01/18   Setzer, Brand Males, NP  isosorbide mononitrate (IMDUR) 30 MG 24 hr tablet Take 1 tablet (30 mg total) by mouth daily. 04/07/19   Leroy Sea, MD  losartan-hydrochlorothiazide (HYZAAR) 100-12.5 MG per tablet Take 1 tablet by mouth daily.    [provider]  methotrexate (RHEUMATREX) 2.5 MG tablet Take 7.5 mg by mouth once a week. On Thursday 03/30/19   [provider]  Multiple Vitamin (MULTIVITAMIN) capsule Take 1 capsule by mouth daily.    [provider]  ondansetron (ZOFRAN) 4 MG tablet Take 1 tablet (4 mg total) by mouth every 8 (eight) hours as needed for nausea or vomiting. 04/07/19   Fayrene Helper, PA-C  pantoprazole (PROTONIX) 40 MG tablet Take 1 tablet (40 mg total) by mouth daily. 04/06/19   Leroy Sea, MD  simvastatin (ZOCOR) 20 MG tablet Take 20 mg by mouth at bedtime.     [provider]  traMADol (ULTRAM) 50 MG tablet Take 1 tablet (50 mg  total) by mouth every 6 (six) hours as needed. 04/07/19   Fayrene Helper, PA-C  Wheat Dextrin (BENEFIBER DRINK MIX) PACK Take 4 g by mouth at bedtime. 08/26/18   Malissa Hippo, MD    Family History Family History  Problem Relation Age of Onset   Heart disease Mother    Hypertension Mother    Asthma Mother    Heart disease Father    Stroke Father    Hypertension Father    Stroke Sister    Asthma Sister    Hypertension Brother    Asthma Brother    Asthma Maternal Grandmother    Heart disease Maternal Grandmother    Diabetes Maternal Grandfather    Asthma Maternal Aunt    Colon cancer Neg Hx     Social History Social History   Tobacco Use  Smoking status: Former Smoker    Packs/day: 2.00    Years: 10.00    Pack years: 20.00    Types: Cigarettes    Quit date: 09/11/1988    Years since quitting: 30.6   Smokeless tobacco: Never Used   Tobacco comment: quit 1990  Substance Use Topics   Alcohol use: No    Comment: stopped in 1990   Drug use: No    Comment: hx smoking marijuana and cocaine weekends. Stopped in the 1990s.     Allergies   Sulfa antibiotics   Review of Systems Review of Systems  Unable to perform ROS: Acuity of condition     Physical Exam Updated Vital Signs BP 125/66    Pulse 98    Temp (!) 101.5 F (38.6 C) (Oral)    Resp (!) 25    Ht 5\' 5"  (1.651 m)    Wt 73 kg    SpO2 93%    BMI 26.79 kg/m    Patient Vitals for the past 24 hrs:  BP Temp Temp src Pulse Resp SpO2 Height Weight  04/16/19 1830 125/66 -- -- 98 (!) 25 93 % -- --  04/16/19 1823 -- -- -- -- -- -- 5\' 5"  (1.651 m) 73 kg  04/16/19 1820 126/63 -- -- 96 19 92 % -- --  04/16/19 1808 -- (!) 101.5 F (38.6 C) Oral 96 -- 91 % -- --     Physical Exam 1755: Physical examination:  Nursing notes reviewed; Vital signs and O2 SAT reviewed;  Constitutional: Well developed, Well nourished, Well hydrated, In no acute distress; Head:  Normocephalic, atraumatic; Eyes: EOMI,  PERRL, No scleral icterus; ENMT: Mouth and pharynx normal, Mucous membranes moist; Neck: Supple, Full range of motion, No lymphadenopathy; Cardiovascular: Regular rate and rhythm, No gallop; Respiratory: Breath sounds clear & equal bilaterally, No wheezes. Normal respiratory effort/excursion; Chest: Nontender, Movement normal; Abdomen: Soft, Nontender, Nondistended, Normal bowel sounds; Genitourinary: No CVA tenderness; Extremities: Peripheral pulses normal, No tenderness, No edema, No calf edema or asymmetry.; Neuro: Awake, alert, closes her eyes often during exam and will re-open them to command.  Speaks few words clearly. No facial droop. Grips equal. Strength 4/5 bilat UE's, 3/5 LLE and pt refuses to lift her RLE d/t pain in her foot..; Skin: Color normal, Warm, Dry.   ED Treatments / Results  Labs (all labs ordered are listed, but only abnormal results are displayed)   EKG EKG Interpretation  Date/Time:  Friday April 16 2019 18:22:05 EDT Ventricular Rate:  96 PR Interval:    QRS Duration: 92 QT Interval:  329 QTC Calculation: 416 R Axis:   6 Text Interpretation:  Sinus rhythm Baseline wander When compared with ECG of 04/07/2019 No significant change was found Confirmed by Francine Graven [phone]) on 04/16/2019 6:46:06 PM   Radiology   Procedures Procedures (including critical care time)  Medications Ordered in ED Medications  iohexol (OMNIPAQUE) 350 MG/ML injection 125 mL (125 mLs Intravenous Contrast Given 04/16/19 1911)     Initial Impression / Assessment and Plan / ED Course  I have reviewed the triage vital signs and the nursing notes.  Pertinent labs & imaging results that were available during my care of the patient were reviewed by me and considered in my medical decision making (see chart for details).     MDM Reviewed: previous chart, nursing note and vitals Reviewed previous: labs and ECG Interpretation: labs, ECG and CT scan Total time providing critical  care: 30-74 minutes. This excludes  time spent performing separately reportable procedures and services. Consults: neurology and admitting MD    CRITICAL CARE Performed by: Samuel JesterKathleen Jorgen Wolfinger Total critical care time: 35 minutes Critical care time was exclusive of separately billable procedures and treating other patients. Critical care was necessary to treat or prevent imminent or life-threatening deterioration. Critical care was time spent personally by me on the following activities: development of treatment plan with patient and/or surrogate as well as nursing, discussions with consultants, evaluation of patient's response to treatment, examination of patient, obtaining history from patient or surrogate, ordering and performing treatments and interventions, ordering and review of laboratory studies, ordering and review of radiographic studies, pulse oximetry and re-evaluation of patient's condition.   Results for orders placed or performed during the hospital encounter of 04/16/19  Ethanol  Result Value Ref Range   Alcohol, Ethyl (B) <10 <10 mg/dL  Protime-INR  Result Value Ref Range   Prothrombin Time 14.1 11.4 - 15.2 seconds   INR 1.1 0.8 - 1.2  APTT  Result Value Ref Range   aPTT 31 24 - 36 seconds  CBC  Result Value Ref Range   WBC 10.6 (H) 4.0 - 10.5 K/uL   RBC 4.35 3.87 - 5.11 MIL/uL   Hemoglobin 12.2 12.0 - 15.0 g/dL   HCT 40.938.0 81.136.0 - 91.446.0 %   MCV 87.4 80.0 - 100.0 fL   MCH 28.0 26.0 - 34.0 pg   MCHC 32.1 30.0 - 36.0 g/dL   RDW 78.215.4 95.611.5 - 21.315.5 %   Platelets 290 150 - 400 K/uL   nRBC 0.0 0.0 - 0.2 %  Differential  Result Value Ref Range   Neutrophils Relative % 68 %   Neutro Abs 7.1 1.7 - 7.7 K/uL   Lymphocytes Relative 28 %   Lymphs Abs 3.0 0.7 - 4.0 K/uL   Monocytes Relative 4 %   Monocytes Absolute 0.5 0.1 - 1.0 K/uL   Eosinophils Relative 0 %   Eosinophils Absolute 0.0 0.0 - 0.5 K/uL   Basophils Relative 0 %   Basophils Absolute 0.0 0.0 - 0.1 K/uL    Immature Granulocytes 0 %   Abs Immature Granulocytes 0.04 0.00 - 0.07 K/uL  Comprehensive metabolic panel  Result Value Ref Range   Sodium 133 (L) 135 - 145 mmol/L   Potassium 3.1 (L) 3.5 - 5.1 mmol/L   Chloride 94 (L) 98 - 111 mmol/L   CO2 26 22 - 32 mmol/L   Glucose, Bld 114 (H) 70 - 99 mg/dL   BUN 33 (H) 8 - 23 mg/dL   Creatinine, Ser 0.861.52 (H) 0.44 - 1.00 mg/dL   Calcium 8.8 (L) 8.9 - 10.3 mg/dL   Total Protein 7.7 6.5 - 8.1 g/dL   Albumin 3.1 (L) 3.5 - 5.0 g/dL   AST 66 (H) 15 - 41 U/L   ALT 39 0 - 44 U/L   Alkaline Phosphatase 41 38 - 126 U/L   Total Bilirubin 0.5 0.3 - 1.2 mg/dL   GFR calc non Af Amer 34 (L) >60 mL/min   GFR calc Af Amer 40 (L) >60 mL/min   Anion gap 13 5 - 15     Ct Head Code Stroke Wo Contrast Result Date: 04/16/2019 CLINICAL DATA:  Code stroke. Right facial droop, headache, and slurred speech. Headaches since a fall on 04/07/2019. EXAM: CT HEAD WITHOUT CONTRAST TECHNIQUE: Contiguous axial images were obtained from the base of the skull through the vertex without intravenous contrast. COMPARISON:  Head MRI 09/18/2017 FINDINGS: Brain:  There is no evidence of acute infarct, intracranial hemorrhage, mass, midline shift, or extra-axial fluid collection. The ventricles and sulci are normal. 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 Deep white matter hypodensities elsewhere bilaterally are nonspecific but compatible with mild chronic small vessel ischemic disease. Vascular: Calcified atherosclerosis at the skull base. No hyperdense vessel. Skull: No fracture or focal osseous lesion. Sinuses/Orbits: Prior endoscopic sinus surgery. No evidence of acute sinus disease. Clear mastoid air cells. Unremarkable orbits. Other: None. ASPECTS [facility] Stroke Program Early CT Score) - Ganglionic level infarction (caudate, lentiform nuclei, internal capsule, insula, M1-M3 cortex): 7 - Supraganglionic infarction (M4-M6 cortex): 3 Total  score (0-10 with 10 being normal): 10 IMPRESSION: 1. No evidence of acute intracranial abnormality. 2. ASPECTS is 10. 3. Mild chronic small vessel ischemic disease. These results were called by telephone at the time of interpretation on 04/16/2019 at 6:15 pm to provider ALEXANDRA LAW , who verbally acknowledged these results. Electronically Signed   By: Sebastian Ache M.D.   On: 04/16/2019 18:16   Ct Code Stroke Cta Head W/wo Contrast Result Date: 04/16/2019 CLINICAL DATA:  Right facial droop, headache, and slurred speech. Recently diagnosed with COVID-19. EXAM: CT ANGIOGRAPHY HEAD AND NECK CT PERFUSION BRAIN TECHNIQUE: Multidetector CT imaging of the head and neck was performed using the standard protocol during bolus administration of intravenous contrast. Multiplanar CT image reconstructions and MIPs were obtained to evaluate the vascular anatomy. Carotid stenosis measurements (when applicable) are obtained utilizing NASCET criteria, using the distal internal carotid diameter as the denominator. Multiphase CT imaging of the brain was performed following IV bolus contrast injection. Subsequent parametric perfusion maps were calculated using RAPID software. CONTRAST:  OMNIPAQUE IOHEXOL 350 MG/ML SOLN COMPARISON:  Chest CTA 04/07/2019 FINDINGS: CTA NECK FINDINGS Aortic arch: Standard 3 vessel aortic arch with mild atherosclerotic plaque. Widely patent arch vessel origins. Right carotid system: Patent with minimal plaque at the carotid bifurcation. No evidence of dissection or stenosis. Left carotid system: Patent without evidence of dissection or stenosis. Vertebral arteries: Patent and codominant without evidence of dissection or stenosis. Skeleton: Bulky flowing anterior vertebral osteophytes throughout the cervical and upper thoracic spine compatible with diffuse idiopathic skeletal hyperostosis. Other neck: No evidence of cervical lymphadenopathy or mass. Upper chest: Patchy and confluent peripheral  ground-glass opacities in the right greater than left upper lobes, progressed from the prior chest CT. Review of the MIP images confirms the above findings CTA HEAD FINDINGS Anterior circulation: The internal carotid arteries are patent from skull base to carotid termini with minimal nonstenotic plaque bilaterally. ACAs and MCAs are patent without evidence of proximal branch occlusion or significant proximal stenosis. The proximal left ACA is tortuous. No aneurysm is identified. Posterior circulation: The intracranial vertebral arteries are widely patent to the basilar. Patent PICAs, AICAs, and SCAs are seen bilaterally. The basilar artery is widely patent. There is a patent right posterior communicating artery. Both PCAs are patent without evidence of flow limiting proximal stenosis. There are mild right P1 and P2 stenoses. No aneurysm is identified. Venous sinuses: Patent. Anatomic variants: None. Review of the MIP images confirms the above findings CT Brain Perfusion Findings: ASPECTS: 10 CBF (<30%) Volume: 0mL Perfusion (Tmax>6.0s) volume: 0mL Mismatch Volume: 0mL Infarction Location:None IMPRESSION: 1. Mild atherosclerosis in the head and neck without large vessel occlusion or flow limiting proximal stenosis. 2. Unremarkable CTP. 3. Partially visualized progressive pulmonary ground-glass opacities consistent with known COVID-19 pneumonia. 4.  Aortic  Atherosclerosis (ICD10-I70.0). These results were called by telephone at the time of interpretation on 04/16/2019 at 7:45 pm to Dr. Clarene DukeMcManus, who verbally acknowledged these results. Electronically Signed   By: Sebastian AcheAllen  Grady M.D.   On: 04/16/2019 19:59   Ct Code Stroke Cta Neck W/wo Contrast Result Date: 04/16/2019 CLINICAL DATA:  Right facial droop, headache, and slurred speech. Recently diagnosed with COVID-19. EXAM: CT ANGIOGRAPHY HEAD AND NECK CT PERFUSION BRAIN TECHNIQUE: Multidetector CT imaging of the head and neck was performed using the standard protocol  during bolus administration of intravenous contrast. Multiplanar CT image reconstructions and MIPs were obtained to evaluate the vascular anatomy. Carotid stenosis measurements (when applicable) are obtained utilizing NASCET criteria, using the distal internal carotid diameter as the denominator. Multiphase CT imaging of the brain was performed following IV bolus contrast injection. Subsequent parametric perfusion maps were calculated using RAPID software. CONTRAST:  125mL OMNIPAQUE IOHEXOL 350 MG/ML SOLN COMPARISON:  Chest CTA 04/07/2019 FINDINGS: CTA NECK FINDINGS Aortic arch: Standard 3 vessel aortic arch with mild atherosclerotic plaque. Widely patent arch vessel origins. Right carotid system: Patent with minimal plaque at the carotid bifurcation. No evidence of dissection or stenosis. Left carotid system: Patent without evidence of dissection or stenosis. Vertebral arteries: Patent and codominant without evidence of dissection or stenosis. Skeleton: Bulky flowing anterior vertebral osteophytes throughout the cervical and upper thoracic spine compatible with diffuse idiopathic skeletal hyperostosis. Other neck: No evidence of cervical lymphadenopathy or mass. Upper chest: Patchy and confluent peripheral ground-glass opacities in the right greater than left upper lobes, progressed from the prior chest CT. Review of the MIP images confirms the above findings CTA HEAD FINDINGS Anterior circulation: The internal carotid arteries are patent from skull base to carotid termini with minimal nonstenotic plaque bilaterally. ACAs and MCAs are patent without evidence of proximal branch occlusion or significant proximal stenosis. The proximal left ACA is tortuous. No aneurysm is identified. Posterior circulation: The intracranial vertebral arteries are widely patent to the basilar. Patent PICAs, AICAs, and SCAs are seen bilaterally. The basilar artery is widely patent. There is a patent right posterior communicating artery.  Both PCAs are patent without evidence of flow limiting proximal stenosis. There are mild right P1 and P2 stenoses. No aneurysm is identified. Venous sinuses: Patent. Anatomic variants: None. Review of the MIP images confirms the above findings CT Brain Perfusion Findings: ASPECTS: 10 CBF (<30%) Volume: 0mL Perfusion (Tmax>6.0s) volume: 0mL Mismatch Volume: 0mL Infarction Location:None IMPRESSION: 1. Mild atherosclerosis in the head and neck without large vessel occlusion or flow limiting proximal stenosis. 2. Unremarkable CTP. 3. Partially visualized progressive pulmonary ground-glass opacities consistent with known COVID-19 pneumonia. 4.  Aortic Atherosclerosis (ICD10-I70.0). These results were called by telephone at the time of interpretation on 04/16/2019 at 7:45 pm to Dr. Clarene DukeMcManus, who verbally acknowledged these results. Electronically Signed   By: Sebastian AcheAllen  Grady M.D.   On: 04/16/2019 19:59    Ct Code Stroke Cta Cerebral Perfusion W/wo Contrast Result Date: 04/16/2019 CLINICAL DATA:  Right facial droop, headache, and slurred speech. Recently diagnosed with COVID-19. EXAM: CT ANGIOGRAPHY HEAD AND NECK CT PERFUSION BRAIN TECHNIQUE: Multidetector CT imaging of the head and neck was performed using the standard protocol during bolus administration of intravenous contrast. Multiplanar CT image reconstructions and MIPs were obtained to evaluate the vascular anatomy. Carotid stenosis measurements (when applicable) are obtained utilizing NASCET criteria, using the distal internal carotid diameter as the denominator. Multiphase CT imaging of the brain was performed following IV bolus contrast injection. Subsequent  parametric perfusion maps were calculated using RAPID software. CONTRAST:  OMNIPAQUE IOHEXOL 350 MG/ML SOLN COMPARISON:  Chest CTA 04/07/2019 FINDINGS: CTA NECK FINDINGS Aortic arch: Standard 3 vessel aortic arch with mild atherosclerotic plaque. Widely patent arch vessel origins. Right carotid system:  Patent with minimal plaque at the carotid bifurcation. No evidence of dissection or stenosis. Left carotid system: Patent without evidence of dissection or stenosis. Vertebral arteries: Patent and codominant without evidence of dissection or stenosis. Skeleton: Bulky flowing anterior vertebral osteophytes throughout the cervical and upper thoracic spine compatible with diffuse idiopathic skeletal hyperostosis. Other neck: No evidence of cervical lymphadenopathy or mass. Upper chest: Patchy and confluent peripheral ground-glass opacities in the right greater than left upper lobes, progressed from the prior chest CT. Review of the MIP images confirms the above findings CTA HEAD FINDINGS Anterior circulation: The internal carotid arteries are patent from skull base to carotid termini with minimal nonstenotic plaque bilaterally. ACAs and MCAs are patent without evidence of proximal branch occlusion or significant proximal stenosis. The proximal left ACA is tortuous. No aneurysm is identified. Posterior circulation: The intracranial vertebral arteries are widely patent to the basilar. Patent PICAs, AICAs, and SCAs are seen bilaterally. The basilar artery is widely patent. There is a patent right posterior communicating artery. Both PCAs are patent without evidence of flow limiting proximal stenosis. There are mild right P1 and P2 stenoses. No aneurysm is identified. Venous sinuses: Patent. Anatomic variants: None. Review of the MIP images confirms the above findings CT Brain Perfusion Findings: ASPECTS: 10 CBF (<30%) Volume: 43mL Perfusion (Tmax>6.0s) volume: 38mL Mismatch Volume: 50mL Infarction Location:None IMPRESSION: 1. Mild atherosclerosis in the head and neck without large vessel occlusion or flow limiting proximal stenosis. 2. Unremarkable CTP. 3. Partially visualized progressive pulmonary ground-glass opacities consistent with known COVID-19 pneumonia. 4.  Aortic Atherosclerosis (ICD10-I70.0). These results were  called by telephone at the time of interpretation on 04/16/2019 at 7:45 pm to Dr. Clarene Duke, who verbally acknowledged these results. Electronically Signed   By: Sebastian Ache M.D.   On: 04/16/2019 19:59     Betty Golden was evaluated in Emergency Department on 04/16/2019 for the symptoms described in the history of present illness. She was evaluated in the context of the global COVID-19 pandemic, which necessitated consideration that the patient might be at risk for infection with the SARS-CoV-2 virus that causes COVID-19. Institutional protocols and algorithms that pertain to the evaluation of patients at risk for COVID-19 are in a state of rapid change based on information released by regulatory bodies including the CDC and federal and state organizations. These policies and algorithms were followed during the patient's care in the ED.   1840:  Code Stroke called on pt's arrival. Tele Neuro Dr. Sherrie Sport has evaluated pt: Pt's daughter now here and states symptoms actually began at 0730 this morning and have been constant since onset, pt without focal symptoms currently, no TPA indicated, CTA/P ordered, pt will need admit for further stroke workup.    2010:  T/C returned from [facility]. Sharl Ma, case discussed, including:  HPI, pertinent PM/SHx, VS/PE, dx testing, ED course and treatment:  Agreeable to admit.      Final Clinical Impressions(s) / ED Diagnoses   Final diagnoses:  None    ED Discharge Orders    None       Samuel Jester, DO 04/18/19 1806

## 2019-04-16 NOTE — ED Notes (Signed)
Date and time results received: 04/16/19 9:38 PM   Test: covid 19 Critical Value: positive  Name of Provider Notified: Dr. Thurnell Garbe  Orders Received? Or Actions Taken: see orders

## 2019-04-16 NOTE — H&P (Addendum)
TRH H&P    Patient Demographics:    Betty CahillDoreen Golden, is a 73 y.o. female  MRN: [id number]  DOB - Jun 23, 1946  Admit Date - 04/16/2019  Referring MD/NP/PA: Dr. Clarene DukeMcManus  Outpatient Primary MD for the patient is Dorna LeitzMilam, Beverly GustJames T, MD  Patient coming from: Home  Chief complaint-dizziness, slurred speech   HPI:    Betty CahillDoreen Golden  is a 73 y.o. female, with history of hypertension, hyperlipidemia, GERD, IBS, history of C. difficile, fibromyalgia, rheumatoid arthritis, asthma/COPD who was [facility] after she was treated for COVID pneumonia.  At that time patient was treated with low-dose steroids and was not given Remdesevir. As per patient's daughter patient has been feeling dizzy at home also complained of generalized weakness.  This morning patient had slurred speech, right-sided facial droop, complains of headache since fall on 04/07/2019.   At that time patient was evaluated and was diagnosed with acute mildly displaced fractures of second third and fourth metatarsal necks.  Patient was discharged from the ED to follow-up with orthopedics as outpatient.  Today she denies chest pain or shortness of breath Complains of cough but unable to cough up any phlegm. Patient is febrile in the ED with T-max 101.5  Tele neurology was consulted, and patient underwent CT head which showed no stroke.  Also CTA head and neck was performed which showed no large vessel occlusion    Review of systems:    In addition to the HPI above,    All other systems reviewed and are negative.    Past History of the following :    Past Medical History:  Diagnosis Date   Arthritis    Asthma    C. difficile colitis    Cervical stenosis of spine    Chronic headaches    Chronic neck pain    Coronary artery disease    Depression    Diverticulosis    Essential hypertension    Fibromyalgia    GERD  (gastroesophageal reflux disease)    History of Salmonella gastroenteritis    HNP (herniated nucleus pulposus), lumbar    Hyperlipidemia    IBS (irritable bowel syndrome)    Lung nodule       Past Surgical History:  Procedure Laterality Date   APPENDECTOMY  1965   BIOPSY  08/26/2018   Procedure: BIOPSY;  Surgeon: Malissa Hippoehman, Najeeb U, MD;  [facility];  Service: Endoscopy;;  gastric   BREAST BIOPSY     BREAST REDUCTION SURGERY  1994   CHOLECYSTECTOMY  1975   COLONOSCOPY N/A 08/19/2013   Procedure: COLONOSCOPY;  Surgeon: Malissa HippoNajeeb U Rehman, MD;  [facility]: [facility];  Service: Endoscopy;  Laterality: N/A;  155-moved to 140 Ann to notify pt   COLONOSCOPY N/A 08/26/2018   Procedure: COLONOSCOPY;  Surgeon: Malissa Hippoehman, Najeeb U, MD;  [facility];  Service: Endoscopy;  Laterality: N/A;   ESOPHAGOGASTRODUODENOSCOPY N/A 08/26/2018   Procedure: ESOPHAGOGASTRODUODENOSCOPY (EGD);  Surgeon: Malissa Hippoehman, Najeeb U, MD;  Location: [facility];  Service: Endoscopy;  Laterality: N/A;  fibromyalgia     LUMBAR LAMINECTOMY/DECOMPRESSION MICRODISCECTOMY Bilateral 09/20/2015   Procedure: MICRO LUMBAR DECOMPRESSION L5-S1 BILATERAL    (1 LEVEL);  Surgeon: [name], MD;  Location: [facility];  Service: Orthopedics;  Laterality: Bilateral;   rheumatoid arthritis     Sinus Surgery     TONSILLECTOMY  1968      Social History:      Social History   Tobacco Use   Smoking status: Former Smoker    Packs/day: 2.00    Years: 10.00    Pack years: 20.00    Types: Cigarettes    Quit date: 09/11/1988    Years since quitting: 30.6   Smokeless tobacco: Never Used   Tobacco comment: quit 1990  Substance Use Topics   Alcohol use: No    Comment: stopped in 1990       Family History :     Family History  Problem Relation Age of Onset   Heart disease Mother    Hypertension Mother    Asthma Mother    Heart disease Father    Stroke Father    Hypertension Father      Stroke Sister    Asthma Sister    Hypertension Brother    Asthma Brother    Asthma Maternal Grandmother    Heart disease Maternal Grandmother    Diabetes Maternal Grandfather    Asthma Maternal Aunt    Colon cancer Neg Hx       Home Medications:   Prior to Admission medications   Medication Sig Start Date End Date Taking? Authorizing Provider  albuterol (PROAIR HFA) 108 (90 Base) MCG/ACT inhaler Inhale 2 puffs into the lungs every 4 (four) hours as needed for wheezing or shortness of breath. 10/07/18   Alfonse Spruce, MD  amLODipine (NORVASC) 10 MG tablet Take 1 tablet (10 mg total) by mouth daily. 04/07/19   Leroy Sea, MD  carvedilol (COREG) 6.25 MG tablet Take 1 tablet (6.25 mg total) by mouth 2 (two) times daily with a meal. 04/06/19   Leroy Sea, MD  DULoxetine (CYMBALTA) 20 MG capsule Take 20 mg by mouth 2 (two) times daily.    [provider]  Fluticasone Furoate (ARNUITY ELLIPTA) 100 MCG/ACT AEPB Inhale 1 Dose into the lungs daily. 10/07/18   Alfonse Spruce, MD  folic acid (FOLVITE) 1 MG tablet Take 1 mg by mouth daily. 03/03/19   [provider]  gabapentin (NEURONTIN) 300 MG capsule Take 900-1,200 mg by mouth See admin instructions. Taking 3 capsules  ( ) in the morning and ( ) 4 capsules in the evening. 06/24/18   [provider]  HUMIRA 40 MG/0.4ML PSKT Inject 40 mg into the skin every 14 (fourteen) days.  09/18/18   [provider]  hydrALAZINE (APRESOLINE) 100 MG tablet Take 1 tablet (100 mg total) by mouth every 8 (eight) hours. 04/06/19   Leroy Sea, MD  hydrocortisone (ANUSOL-HC) 2.5 % rectal cream Place rectally 2 (two) times daily. 12/01/18   Setzer, Brand Males, NP  isosorbide mononitrate (IMDUR) 30 MG 24 hr tablet Take 1 tablet (30 mg total) by mouth daily. 04/07/19   Leroy Sea, MD  losartan-hydrochlorothiazide (HYZAAR) 100-12.5 MG per tablet Take 1 tablet by mouth daily.    [provider]  methotrexate (RHEUMATREX) 2.5 MG tablet Take 7.5 mg by mouth once a week. On Thursday 03/30/19   [provider]  Multiple Vitamin (MULTIVITAMIN) capsule Take 1 capsule by mouth daily.  [provider]  ondansetron (ZOFRAN) 4 MG tablet Take 1 tablet (4 mg total) by mouth every 8 (eight) hours as needed for nausea or vomiting. 04/07/19   Fayrene Helperran, Bowie, PA-C  pantoprazole (PROTONIX) 40 MG tablet Take 1 tablet (40 mg total) by mouth daily. 04/06/19   Leroy SeaSingh, Prashant K, MD  simvastatin (ZOCOR) 20 MG tablet Take 20 mg by mouth at bedtime.     [provider]  traMADol (ULTRAM) 50 MG tablet Take 1 tablet (50 mg total) by mouth every 6 (six) hours as needed. 04/07/19   Fayrene Helperran, Bowie, PA-C  Wheat Dextrin (BENEFIBER DRINK MIX) PACK Take 4 g by mouth at bedtime. 08/26/18   Malissa Hippoehman, Najeeb U, MD     Allergies:     Allergies  Allergen Reactions   Sulfa Antibiotics Rash     Physical Exam:   Vitals  Blood pressure (!) 115/35, pulse 87, temperature (!) 101.5 F (38.6 C), temperature source Oral, resp. rate (!) 25, height 5\' 5"  (1.651 m), weight 73 kg, SpO2 90 %.  1.  General: Appears in no acute distress  2. Psychiatric: Alert, oriented x3, intact insight and judgment  3. Neurologic: Cranial nerves II through XII grossly intact, motor strength 5/5 in both upper extremities and left lower extremity.  Right lower extremity is in splint, patient unable to move due to pain.  Sensations are intact bilaterally  4. HEENMT:  Atraumatic normocephalic, extraocular muscles are intact  5. Respiratory : Clear to auscultation bilaterally, no wheezing or crackles  6. Cardiovascular : S1-S2, regular, no murmur auscultated  7. Gastrointestinal:  Abdomen is soft, nontender, no organomegaly  8. Skin:  No rashes noted  9.Musculoskeletal:  Right lower extremity in splint    Data Review:    CBC Recent Labs  Lab 04/16/19 1758  WBC 10.6*  HGB 12.2  HCT 38.0  PLT  290  MCV 87.4  MCH 28.0  MCHC 32.1  RDW 15.4  LYMPHSABS 3.0  MONOABS 0.5  EOSABS 0.0  BASOSABS 0.0   ------------------------------------------------------------------------------------------------------------------  Results for orders placed or performed during the hospital encounter of 04/16/19 (from the past 48 hour(s))  Ethanol     Status: None   Collection Time: 04/16/19  5:58 PM  Result Value Ref Range   Alcohol, Ethyl (B) <10 <10 mg/dL    Comment: (NOTE) Lowest detectable limit for serum alcohol is 10 mg/dL. For medical purposes only. Performed at [facility], [address] [place], [place] [address]   Protime-INR     Status: None   Collection Time: 04/16/19  5:58 PM  Result Value Ref Range   Prothrombin Time 14.1 11.4 - 15.2 seconds   INR 1.1 0.8 - 1.2    Comment: (NOTE) INR goal varies based on device and disease states. Performed at [facility], [address] [place], [place] [address]   APTT     Status: None   Collection Time: 04/16/19  5:58 PM  Result Value Ref Range   aPTT 31 24 - 36 seconds    Comment: Performed at [facility], [address] [place], [place] [address]  CBC     Status: Abnormal   Collection Time: 04/16/19  5:58 PM  Result Value Ref Range   WBC 10.6 (H) 4.0 - 10.5 K/uL   RBC 4.35 3.87 - 5.11 MIL/uL   Hemoglobin 12.2 12.0 - 15.0 g/dL   HCT 53.638.0 64.436.0 - 03.446.0 %   MCV 87.4 80.0 - 100.0 fL   MCH 28.0  26.0 - 34.0 pg   MCHC 32.1 30.0 - 36.0 g/dL   RDW 16.1 09.6 - 04.5 %   Platelets 290 150 - 400 K/uL   nRBC 0.0 0.0 - 0.2 %    Comment: Performed at [facility], [address] [place], [place] [address]  Differential     Status: None   Collection Time: 04/16/19  5:58 PM  Result Value Ref Range   Neutrophils Relative % 68 %   Neutro Abs 7.1 1.7 - 7.7 K/uL   Lymphocytes Relative 28 %   Lymphs Abs 3.0 0.7 - 4.0 K/uL   Monocytes Relative 4 %   Monocytes Absolute 0.5 0.1 - 1.0 K/uL   Eosinophils Relative 0 %   Eosinophils Absolute 0.0  0.0 - 0.5 K/uL   Basophils Relative 0 %   Basophils Absolute 0.0 0.0 - 0.1 K/uL   Immature Granulocytes 0 %   Abs Immature Granulocytes 0.04 0.00 - 0.07 K/uL    Comment: Performed at [facility], [address], [place], [place] [address]  Comprehensive metabolic panel     Status: Abnormal   Collection Time: 04/16/19  5:58 PM  Result Value Ref Range   Sodium 133 (L) 135 - 145 mmol/L   Potassium 3.1 (L) 3.5 - 5.1 mmol/L   Chloride 94 (L) 98 - 111 mmol/L   CO2 26 22 - 32 mmol/L   Glucose, Bld 114 (H) 70 - 99 mg/dL   BUN 33 (H) 8 - 23 mg/dL   Creatinine, Ser 8.29 (H) 0.44 - 1.00 mg/dL   Calcium 8.8 (L) 8.9 - 10.3 mg/dL   Total Protein 7.7 6.5 - 8.1 g/dL   Albumin 3.1 (L) 3.5 - 5.0 g/dL   AST 66 (H) 15 - 41 U/L   ALT 39 0 - 44 U/L   Alkaline Phosphatase 41 38 - 126 U/L   Total Bilirubin 0.5 0.3 - 1.2 mg/dL   GFR calc non Af Amer 34 (L) >60 mL/min   GFR calc Af Amer 40 (L) >60 mL/min   Anion gap 13 5 - 15    Comment: Performed at [facility], [address], [place], [place] [address]  SARS Coronavirus 2 [facility] order, Performed in [facility] hospital lab) Nasopharyngeal Nasopharyngeal Swab     Status: Abnormal   Collection Time: 04/16/19  8:00 PM   Specimen: Nasopharyngeal Swab  Result Value Ref Range   SARS Coronavirus 2 POSITIVE (A) NEGATIVE    Comment: RESULT CALLED TO, READ BACK BY AND VERIFIED WITH: NORMAN,B @ 2138 ON 04/16/19 BY JUW Performed at [facility], [address], [place], [place] [address]     Chemistries  Recent Labs  Lab 04/16/19 1758  NA 133*  K 3.1*  CL 94*  CO2 26  GLUCOSE 114*  BUN 33*  CREATININE 1.52*  CALCIUM 8.8*  AST 66*  ALT 39  ALKPHOS 41  BILITOT 0.5   ------------------------------------------------------------------------------------------------------------------  ------------------------------------------------------------------------------------------------------------------ GFR: Estimated Creatinine Clearance: 34 mL/min (A)  (by C-G formula based on SCr of 1.52 mg/dL (H)). Liver Function Tests: Recent Labs  Lab 04/16/19 1758  AST 66*  ALT 39  ALKPHOS 41  BILITOT 0.5  PROT 7.7  ALBUMIN 3.1*   No results for input(s): LIPASE, AMYLASE in the last 168 hours. No results for input(s): AMMONIA in the last 168 hours. Coagulation Profile: Recent Labs  Lab 04/16/19 1758  INR 1.1   Cardiac Enzymes: No results for input(s): CKTOTAL, CKMB, CKMBINDEX, TROPONINI in the last 168 hours.  BNP (last 3 results) No results for input(s): PROBNP in the last 8760 hours. HbA1C: No results for input(s): HGBA1C in the last 72 hours. CBG: No results for input(s): GLUCAP in the last 168 hours. Lipid Profile: No results for input(s): CHOL, HDL, LDLCALC, TRIG, CHOLHDL, LDLDIRECT in the last 72 hours. Thyroid Function Tests: No results for input(s): TSH, T4TOTAL, FREET4, T3FREE, THYROIDAB in the last 72 hours. Anemia Panel: No results for input(s): VITAMINB12, FOLATE, FERRITIN, TIBC, IRON, RETICCTPCT in the last 72 hours.  --------------------------------------------------------------------------------------------------------------- Urine analysis:    Component Value Date/Time   COLORURINE STRAW (A) 04/07/2019 1741   APPEARANCEUR CLEAR 04/07/2019 1741   LABSPEC 1.026 04/07/2019 1741   PHURINE 6.0 04/07/2019 1741   GLUCOSEU NEGATIVE 04/07/2019 1741   HGBUR NEGATIVE 04/07/2019 1741   BILIRUBINUR NEGATIVE 04/07/2019 1741   KETONESUR NEGATIVE 04/07/2019 1741   PROTEINUR NEGATIVE 04/07/2019 1741   UROBILINOGEN 0.2 03/09/2015 1035   NITRITE NEGATIVE 04/07/2019 1741   LEUKOCYTESUR NEGATIVE 04/07/2019 1741      Imaging Results:    Ct Code Stroke Cta Head W/wo Contrast  Result Date: 04/16/2019 CLINICAL DATA:  Right facial droop, headache, and slurred speech. Recently diagnosed with COVID-19. EXAM: CT ANGIOGRAPHY HEAD AND NECK CT PERFUSION BRAIN TECHNIQUE: Multidetector CT imaging of the head and neck was performed using  the standard protocol during bolus administration of intravenous contrast. Multiplanar CT image reconstructions and MIPs were obtained to evaluate the vascular anatomy. Carotid stenosis measurements (when applicable) are obtained utilizing NASCET criteria, using the distal internal carotid diameter as the denominator. Multiphase CT imaging of the brain was performed following IV bolus contrast injection. Subsequent parametric perfusion maps were calculated using RAPID software. CONTRAST:  OMNIPAQUE IOHEXOL 350 MG/ML SOLN COMPARISON:  Chest CTA 04/07/2019 FINDINGS: CTA NECK FINDINGS Aortic arch: Standard 3 vessel aortic arch with mild atherosclerotic plaque. Widely patent arch vessel origins. Right carotid system: Patent with minimal plaque at the carotid bifurcation. No evidence of dissection or stenosis. Left carotid system: Patent without evidence of dissection or stenosis. Vertebral arteries: Patent and codominant without evidence of dissection or stenosis. Skeleton: Bulky flowing anterior vertebral osteophytes throughout the cervical and upper thoracic spine compatible with diffuse idiopathic skeletal hyperostosis. Other neck: No evidence of cervical lymphadenopathy or mass. Upper chest: Patchy and confluent peripheral ground-glass opacities in the right greater than left upper lobes, progressed from the prior chest CT. Review of the MIP images confirms the above findings CTA HEAD FINDINGS Anterior circulation: The internal carotid arteries are patent from skull base to carotid termini with minimal nonstenotic plaque bilaterally. ACAs and MCAs are patent without evidence of proximal branch occlusion or significant proximal stenosis. The proximal left ACA is tortuous. No aneurysm is identified. Posterior circulation: The intracranial vertebral arteries are widely patent to the basilar. Patent PICAs, AICAs, and SCAs are seen bilaterally. The basilar artery is widely patent. There is a patent right posterior  communicating artery. Both PCAs are patent without evidence of flow limiting proximal stenosis. There are mild right P1 and P2 stenoses. No aneurysm is identified. Venous sinuses: Patent. Anatomic variants: None. Review of the MIP images confirms the above findings CT Brain Perfusion Findings: ASPECTS: 10 CBF (<30%) Volume: 70mL Perfusion (Tmax>6.0s) volume: 84mL Mismatch Volume: 33mL Infarction Location:None IMPRESSION: 1. Mild atherosclerosis in the head and neck without large vessel occlusion or flow limiting proximal stenosis. 2. Unremarkable CTP. 3. Partially visualized progressive pulmonary ground-glass opacities consistent with known COVID-19 pneumonia. 4.  Aortic Atherosclerosis (ICD10-I70.0). These results were  called by telephone at the time of interpretation on 04/16/2019 at 7:45 pm to Dr. Clarene Duke, who verbally acknowledged these results. Electronically Signed   By: Sebastian Ache M.D.   On: 04/16/2019 19:59   Ct Code Stroke Cta Neck W/wo Contrast  Result Date: 04/16/2019 CLINICAL DATA:  Right facial droop, headache, and slurred speech. Recently diagnosed with COVID-19. EXAM: CT ANGIOGRAPHY HEAD AND NECK CT PERFUSION BRAIN TECHNIQUE: Multidetector CT imaging of the head and neck was performed using the standard protocol during bolus administration of intravenous contrast. Multiplanar CT image reconstructions and MIPs were obtained to evaluate the vascular anatomy. Carotid stenosis measurements (when applicable) are obtained utilizing NASCET criteria, using the distal internal carotid diameter as the denominator. Multiphase CT imaging of the brain was performed following IV bolus contrast injection. Subsequent parametric perfusion maps were calculated using RAPID software. CONTRAST:  OMNIPAQUE IOHEXOL 350 MG/ML SOLN COMPARISON:  Chest CTA 04/07/2019 FINDINGS: CTA NECK FINDINGS Aortic arch: Standard 3 vessel aortic arch with mild atherosclerotic plaque. Widely patent arch vessel origins. Right carotid  system: Patent with minimal plaque at the carotid bifurcation. No evidence of dissection or stenosis. Left carotid system: Patent without evidence of dissection or stenosis. Vertebral arteries: Patent and codominant without evidence of dissection or stenosis. Skeleton: Bulky flowing anterior vertebral osteophytes throughout the cervical and upper thoracic spine compatible with diffuse idiopathic skeletal hyperostosis. Other neck: No evidence of cervical lymphadenopathy or mass. Upper chest: Patchy and confluent peripheral ground-glass opacities in the right greater than left upper lobes, progressed from the prior chest CT. Review of the MIP images confirms the above findings CTA HEAD FINDINGS Anterior circulation: The internal carotid arteries are patent from skull base to carotid termini with minimal nonstenotic plaque bilaterally. ACAs and MCAs are patent without evidence of proximal branch occlusion or significant proximal stenosis. The proximal left ACA is tortuous. No aneurysm is identified. Posterior circulation: The intracranial vertebral arteries are widely patent to the basilar. Patent PICAs, AICAs, and SCAs are seen bilaterally. The basilar artery is widely patent. There is a patent right posterior communicating artery. Both PCAs are patent without evidence of flow limiting proximal stenosis. There are mild right P1 and P2 stenoses. No aneurysm is identified. Venous sinuses: Patent. Anatomic variants: None. Review of the MIP images confirms the above findings CT Brain Perfusion Findings: ASPECTS: 10 CBF (<30%) Volume: 0mL Perfusion (Tmax>6.0s) volume: 0mL Mismatch Volume: 0mL Infarction Location:None IMPRESSION: 1. Mild atherosclerosis in the head and neck without large vessel occlusion or flow limiting proximal stenosis. 2. Unremarkable CTP. 3. Partially visualized progressive pulmonary ground-glass opacities consistent with known COVID-19 pneumonia. 4.  Aortic Atherosclerosis (ICD10-I70.0). These results  were called by telephone at the time of interpretation on 04/16/2019 at 7:45 pm to Dr. Clarene Duke, who verbally acknowledged these results. Electronically Signed   By: Sebastian Ache M.D.   On: 04/16/2019 19:59   Ct Code Stroke Cta Cerebral Perfusion W/wo Contrast  Result Date: 04/16/2019 CLINICAL DATA:  Right facial droop, headache, and slurred speech. Recently diagnosed with COVID-19. EXAM: CT ANGIOGRAPHY HEAD AND NECK CT PERFUSION BRAIN TECHNIQUE: Multidetector CT imaging of the head and neck was performed using the standard protocol during bolus administration of intravenous contrast. Multiplanar CT image reconstructions and MIPs were obtained to evaluate the vascular anatomy. Carotid stenosis measurements (when applicable) are obtained utilizing NASCET criteria, using the distal internal carotid diameter as the denominator. Multiphase CT imaging of the brain was performed following IV bolus contrast injection. Subsequent parametric perfusion maps were  calculated using RAPID software. CONTRAST:  158mL OMNIPAQUE IOHEXOL 350 MG/ML SOLN COMPARISON:  Chest CTA 04/07/2019 FINDINGS: CTA NECK FINDINGS Aortic arch: Standard 3 vessel aortic arch with mild atherosclerotic plaque. Widely patent arch vessel origins. Right carotid system: Patent with minimal plaque at the carotid bifurcation. No evidence of dissection or stenosis. Left carotid system: Patent without evidence of dissection or stenosis. Vertebral arteries: Patent and codominant without evidence of dissection or stenosis. Skeleton: Bulky flowing anterior vertebral osteophytes throughout the cervical and upper thoracic spine compatible with diffuse idiopathic skeletal hyperostosis. Other neck: No evidence of cervical lymphadenopathy or mass. Upper chest: Patchy and confluent peripheral ground-glass opacities in the right greater than left upper lobes, progressed from the prior chest CT. Review of the MIP images confirms the above findings CTA HEAD FINDINGS  Anterior circulation: The internal carotid arteries are patent from skull base to carotid termini with minimal nonstenotic plaque bilaterally. ACAs and MCAs are patent without evidence of proximal branch occlusion or significant proximal stenosis. The proximal left ACA is tortuous. No aneurysm is identified. Posterior circulation: The intracranial vertebral arteries are widely patent to the basilar. Patent PICAs, AICAs, and SCAs are seen bilaterally. The basilar artery is widely patent. There is a patent right posterior communicating artery. Both PCAs are patent without evidence of flow limiting proximal stenosis. There are mild right P1 and P2 stenoses. No aneurysm is identified. Venous sinuses: Patent. Anatomic variants: None. Review of the MIP images confirms the above findings CT Brain Perfusion Findings: ASPECTS: 10 CBF (<30%) Volume: 56mL Perfusion (Tmax>6.0s) volume: 82mL Mismatch Volume: 71mL Infarction Location:None IMPRESSION: 1. Mild atherosclerosis in the head and neck without large vessel occlusion or flow limiting proximal stenosis. 2. Unremarkable CTP. 3. Partially visualized progressive pulmonary ground-glass opacities consistent with known COVID-19 pneumonia. 4.  Aortic Atherosclerosis (ICD10-I70.0). These results were called by telephone at the time of interpretation on 04/16/2019 at 7:45 pm to Dr. Thurnell Garbe, who verbally acknowledged these results. Electronically Signed   By: Logan Bores M.D.   On: 04/16/2019 19:59   Ct Head Code Stroke Wo Contrast  Result Date: 04/16/2019 CLINICAL DATA:  Code stroke. Right facial droop, headache, and slurred speech. Headaches since a fall on 04/07/2019. EXAM: CT HEAD WITHOUT CONTRAST TECHNIQUE: Contiguous axial images were obtained from the base of the skull through the vertex without intravenous contrast. COMPARISON:  Head MRI 09/18/2017 FINDINGS: Brain: There is no evidence of acute infarct, intracranial hemorrhage, mass, midline shift, or extra-axial fluid  collection. The ventricles and sulci are normal. 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 Deep white matter hypodensities elsewhere bilaterally are nonspecific but compatible with mild chronic small vessel ischemic disease. Vascular: Calcified atherosclerosis at the skull base. No hyperdense vessel. Skull: No fracture or focal osseous lesion. Sinuses/Orbits: Prior endoscopic sinus surgery. No evidence of acute sinus disease. Clear mastoid air cells. Unremarkable orbits. Other: None. ASPECTS [facility] Stroke Program Early CT Score) - Ganglionic level infarction (caudate, lentiform nuclei, internal capsule, insula, M1-M3 cortex): 7 - Supraganglionic infarction (M4-M6 cortex): 3 Total score (0-10 with 10 being normal): 10 IMPRESSION: 1. No evidence of acute intracranial abnormality. 2. ASPECTS is 10. 3. Mild chronic small vessel ischemic disease. These results were called by telephone at the time of interpretation on 04/16/2019 at 6:15 pm to provider ALEXANDRA LAW , who verbally acknowledged these results. Electronically Signed   By: Logan Bores M.D.   On: 04/16/2019 18:16    My personal review  of EKG: Rhythm NSR   Assessment & Plan:    Active Problems:   TIA (transient ischemic attack)   1. TIA-patient's neurological symptoms have resolved, she does not have any slurred speech or facial droop.  Will initiate TIA work-up.  Transfer to [facility] for MRI brain, echocardiogram.  Start aspirin 325 mg daily.  Swallow evaluation.  Check lipid panel, hemoglobin A1c.  2. Fever-patient was diagnosed with COVID infection on 04/04/2019, and was discharged on 04/06/2019 after she was treated with low-dose steroids.  Today patient presents with dizziness, frequent falls, with temperature 101.5, COVID 19 is again positive.  Will repeat chest x-ray, patient denies any respiratory symptoms but her O2 sats did drop to 89% on room air.  Will check inflammatory markers including  CRP, pro calcitonin, LDH, ferritin, d-dimer.  Chest x-ray shows findings consistent with atypical pneumonia, will be  starting Remdesevir  and low dose  Decadron.  3. Hypertension-we will hold antihypertensive medications for permissive hypertension.  4. Acute kidney injury-patient's creatinine is 1.52, baseline is 0.89 as of 04/07/2019.  Patient is on diuretics, HCTZ and losartan.  Will hold diuretics and losartan at this time, start gentle IV hydration with normal saline.  5. Fibromyalgia-continue gabapentin, duloxetine  6. Rheumatoid arthritis-patient is on Humira as outpatient.  Continue methotrexate every 7 days.  Next dose due is on Thursday  7. Hypokalemia-replace potassium.  Follow BMP in a.m.   DVT Prophylaxis-   Lovenox   AM Labs Ordered, also please review Full Orders  Family Communication: Admission, patients condition and plan of care including tests being ordered have been discussed with the patient  who indicate understanding and agree with the plan and Code Status.  Code Status: Full code  Admission status: Inpatient: Based on patients clinical presentation and evaluation of above clinical data, I have made determination that patient meets Inpatient criteria at this time.  Time spent in minutes : 60 minutes   Meredeth Ide M.D on 04/16/2019 at 11:57 PM

## 2019-04-16 NOTE — ED Triage Notes (Signed)
Patient had a fall on the 9th of September and has had HA ever since. Family noticed she had slurred speech at 1400.EMS reports right side facial droop, headache and intermittent slurred speech. Patient evaled by EDP upon arrival and was taken straight to CT.

## 2019-04-16 NOTE — ED Notes (Signed)
Patient taken back to CT for additional scans

## 2019-04-17 ENCOUNTER — Encounter (HOSPITAL_COMMUNITY): Payer: Self-pay | Admitting: Emergency Medicine

## 2019-04-17 ENCOUNTER — Inpatient Hospital Stay (HOSPITAL_COMMUNITY): Payer: Medicare Other

## 2019-04-17 ENCOUNTER — Encounter (HOSPITAL_COMMUNITY): Payer: Medicare Other

## 2019-04-17 ENCOUNTER — Other Ambulatory Visit: Payer: Self-pay

## 2019-04-17 DIAGNOSIS — G459 Transient cerebral ischemic attack, unspecified: Secondary | ICD-10-CM

## 2019-04-17 LAB — ECHOCARDIOGRAM LIMITED
Height: 65 in
Weight: 2576 oz

## 2019-04-17 MED ORDER — ACETAMINOPHEN 160 MG/5ML PO SOLN: 650.0000 mg | ORAL | Status: DC | PRN

## 2019-04-17 MED ORDER — ASPIRIN 300 MG RE SUPP
300.0000 mg | Freq: Every day | RECTAL | Status: DC
  Filled 2019-04-17: qty 1

## 2019-04-17 MED ORDER — POTASSIUM CHLORIDE 10 MEQ/100ML IV SOLN
INTRAVENOUS | Status: AC
  Filled 2019-04-17: qty 100

## 2019-04-17 MED ORDER — SODIUM CHLORIDE 0.9 % IV SOLN
INTRAVENOUS | Status: DC
  Administered 2019-04-17: 04:00:00 via INTRAVENOUS

## 2019-04-17 MED ORDER — GABAPENTIN 400 MG PO CAPS
400.0000 mg | ORAL_CAPSULE | Freq: Three times a day (TID) | ORAL | Status: DC
  Administered 2019-04-17 – 2019-04-18 (×3): 400 mg via ORAL
  Filled 2019-04-17 (×3): qty 1

## 2019-04-17 MED ORDER — ALBUTEROL SULFATE HFA 108 (90 BASE) MCG/ACT IN AERS
2.0000 | INHALATION_SPRAY | RESPIRATORY_TRACT | Status: DC | PRN
  Administered 2019-04-21: 2 via RESPIRATORY_TRACT
  Filled 2019-04-17: qty 6.7

## 2019-04-17 MED ORDER — DULOXETINE HCL 20 MG PO CPEP
20.0000 mg | ORAL_CAPSULE | Freq: Two times a day (BID) | ORAL | Status: DC
  Administered 2019-04-17 – 2019-04-21 (×9): 20 mg via ORAL
  Filled 2019-04-17 (×11): qty 1

## 2019-04-17 MED ORDER — FLUTICASONE PROPIONATE HFA 220 MCG/ACT IN AERO
1.0000 | INHALATION_SPRAY | Freq: Two times a day (BID) | RESPIRATORY_TRACT | Status: DC
  Administered 2019-04-17 – 2019-04-21 (×8): 1 via RESPIRATORY_TRACT
  Filled 2019-04-17 (×3): qty 12

## 2019-04-17 MED ORDER — ACETAMINOPHEN 650 MG RE SUPP: 650.0000 mg | RECTAL | Status: DC | PRN

## 2019-04-17 MED ORDER — ASPIRIN 325 MG PO TABS
325.0000 mg | ORAL_TABLET | Freq: Every day | ORAL | Status: DC
  Administered 2019-04-17: 325 mg via ORAL
  Filled 2019-04-17: qty 1

## 2019-04-17 MED ORDER — STROKE: EARLY STAGES OF RECOVERY BOOK
Freq: Once | Status: AC
  Administered 2019-04-17: 10:00:00
  Filled 2019-04-17: qty 1

## 2019-04-17 MED ORDER — SODIUM CHLORIDE 0.9 % IV SOLN: INTRAVENOUS | Status: DC

## 2019-04-17 MED ORDER — SIMVASTATIN 20 MG PO TABS
20.0000 mg | ORAL_TABLET | Freq: Every day | ORAL | Status: DC
  Administered 2019-04-17 – 2019-04-20 (×4): 20 mg via ORAL
  Filled 2019-04-17: qty 1
  Filled 2019-04-17: qty 2
  Filled 2019-04-17 (×3): qty 1

## 2019-04-17 MED ORDER — ISOSORBIDE MONONITRATE ER 30 MG PO TB24
30.0000 mg | ORAL_TABLET | Freq: Every day | ORAL | Status: DC
  Administered 2019-04-17 – 2019-04-21 (×5): 30 mg via ORAL
  Filled 2019-04-17 (×5): qty 1

## 2019-04-17 MED ORDER — SODIUM CHLORIDE 0.9 % IV SOLN
200.0000 mg | Freq: Once | INTRAVENOUS | Status: AC
  Administered 2019-04-17: 200 mg via INTRAVENOUS
  Filled 2019-04-17: qty 40

## 2019-04-17 MED ORDER — SODIUM CHLORIDE 0.9 % IV SOLN
100.0000 mg | INTRAVENOUS | Status: AC
  Administered 2019-04-18 – 2019-04-21 (×4): 100 mg via INTRAVENOUS
  Filled 2019-04-17 (×5): qty 20

## 2019-04-17 MED ORDER — DEXAMETHASONE SODIUM PHOSPHATE 10 MG/ML IJ SOLN
6.0000 mg | INTRAMUSCULAR | Status: DC
  Administered 2019-04-17 – 2019-04-21 (×5): 6 mg via INTRAVENOUS
  Filled 2019-04-17 (×5): qty 1

## 2019-04-17 MED ORDER — POTASSIUM CHLORIDE 10 MEQ/100ML IV SOLN
10.0000 meq | INTRAVENOUS | Status: AC
  Administered 2019-04-17 (×2): 10 meq via INTRAVENOUS
  Filled 2019-04-17: qty 100

## 2019-04-17 MED ORDER — CARVEDILOL 6.25 MG PO TABS
6.2500 mg | ORAL_TABLET | Freq: Two times a day (BID) | ORAL | Status: DC
  Administered 2019-04-18 – 2019-04-21 (×7): 6.25 mg via ORAL
  Filled 2019-04-17 (×7): qty 1

## 2019-04-17 MED ORDER — ENOXAPARIN SODIUM 40 MG/0.4ML ~~LOC~~ SOLN
40.0000 mg | SUBCUTANEOUS | Status: DC
  Administered 2019-04-17 – 2019-04-21 (×5): 40 mg via SUBCUTANEOUS
  Filled 2019-04-17 (×5): qty 0.4

## 2019-04-17 MED ORDER — ACETAMINOPHEN 325 MG PO TABS
650.0000 mg | ORAL_TABLET | ORAL | Status: DC | PRN
  Administered 2019-04-17: 650 mg via ORAL
  Filled 2019-04-17: qty 2

## 2019-04-17 MED ORDER — METHOTREXATE 2.5 MG PO TABS
7.5000 mg | ORAL_TABLET | ORAL | Status: DC
  Filled 2019-04-17: qty 3

## 2019-04-17 MED ORDER — POTASSIUM CHLORIDE 10 MEQ/100ML IV SOLN
10.0000 meq | INTRAVENOUS | Status: AC
  Administered 2019-04-17: 10 meq via INTRAVENOUS
  Filled 2019-04-17: qty 100

## 2019-04-17 MED ORDER — LORAZEPAM 2 MG/ML IJ SOLN
1.0000 mg | Freq: Once | INTRAMUSCULAR | Status: AC
  Administered 2019-04-17: 1 mg via INTRAVENOUS
  Filled 2019-04-17: qty 1

## 2019-04-17 MED ORDER — ASPIRIN 81 MG PO CHEW
81.0000 mg | CHEWABLE_TABLET | Freq: Every day | ORAL | Status: DC
  Administered 2019-04-18 – 2019-04-21 (×4): 81 mg via ORAL
  Filled 2019-04-17 (×4): qty 1

## 2019-04-17 NOTE — Progress Notes (Signed)
  Echocardiogram 2D Echocardiogram has been performed.  Betty Golden 04/17/2019, 5:37 PM

## 2019-04-17 NOTE — Evaluation (Signed)
Physical Therapy Evaluation Patient Details Name: Betty Golden MRN: [id number] DOB: 05-Oct-1945 Today's Date: 04/17/2019   History of Present Illness  Pt is a 73 y.o. female with recent admision to [facility] (9/5-04/06/19) with COVID, then with fall 04/07/19 sustaining 2-4 metatarsal fxs with d/c from ED, now admitted 04/16/19 with slurred speech and R-side facial droop. Pt tested postive COVID-19 again. MRI showed no acute abnormality; mild chronic small vessel ischemia improving chronic left internal capsule lacunar infarct. Worked up for TIA. Other PMH includes HTN, HLD, IBS, C.diff, fibromyalgia, RA, asthma, COPD.    Clinical Impression  Pt presents with an overall decrease in functional mobility secondary to above. Prior to hurting foot, pt mod indep with RW and performing ADLs; since breaking toes, pt has required assist from family for all mobility, including transfers to w/c, and ADL tasks. Today, pt required modA to stand with RW while maintaining RLE NWB. Pt declining SNF-level therapies; plans to return home with continued 24/7 assist from family. Pt would benefit from continued acute PT services to maximize functional mobility and independence prior to d/c with HHPT services.   SpO2 97% on RA BP 111/70 HR 86    Follow Up Recommendations Home health PT;Supervision/Assistance - 24 hour(declined SNF; reports family available for 24/7)    Equipment Recommendations  None recommended by PT    Recommendations for Other Services       Precautions / Restrictions Precautions Precautions: Fall Restrictions Other Position/Activity Restrictions: Assume R foot NWB due to recent 2nd-4th metatarsal fxs 04/07/19      Mobility  Bed Mobility Overal bed mobility: Modified Independent             General bed mobility comments: Increased time and effort  Transfers Overall transfer level: Needs assistance Equipment used: Rolling walker (2 wheeled) Transfers: Sit to/from Stand Sit to Stand:  Mod assist         General transfer comment: ModA to assist trunk elevation while pt maintaining RLE NWB precautions; quickly fatigued requiring maxA to maintain standing, modA for eccentric control to sit. Pt reports, "I just get like this and I have to sit right away." C/o dizziness, BP 111/70, SpO2 97% on RA  Ambulation/Gait                Stairs            Wheelchair Mobility    Modified Rankin (Stroke Patients Only)       Balance Overall balance assessment: Needs assistance   Sitting balance-Leahy Scale: Good       Standing balance-Leahy Scale: Poor Standing balance comment: Reliant on UE support and external assist while attempting to maintain RLE NWB                             Pertinent Vitals/Pain Pain Assessment: Faces Faces Pain Scale: Hurts little more Pain Location: R foot Pain Descriptors / Indicators: Aching Pain Intervention(s): Monitored during session;Repositioned    Home Living Family/patient expects to be discharged to:: Private residence Living Arrangements: Children;Other relatives Available Help at Discharge: Family;Available 24 hours/day Type of Home: House Home Access: Stairs to enter   [employer] of Steps: 2 Home Layout: One level Home Equipment: Walker - 2 wheels;Cane - single point;Wheelchair - manual      Prior Function Level of Independence: Needs assistance   Gait / Transfers Assistance Needed: Prior to initial injury, pt mod indep with RW. Since breaking foot, has  required assist from family for standing and transfers, using RW and w/c  ADL's / Homemaking Assistance Needed: Prior to injury, mod indep with basic ADLs. Since breaking foot, daughter assist with bed baths, pt uses bed pan/Depends/assist to [facility]        Hand Dominance        Extremity/Trunk Assessment   Upper Extremity Assessment Upper Extremity Assessment: Generalized weakness    Lower Extremity Assessment Lower Extremity  Assessment: Generalized weakness;RLE deficits/detail RLE Deficits / Details: s/p metatarsal fxs in ace wrap; hip and knee functionally at least 3/5       Communication   Communication: HOH  Cognition Arousal/Alertness: Awake/alert Behavior During Therapy: WFL for tasks assessed/performed Overall Cognitive Status: No family/caregiver present to determine baseline cognitive functioning                                 General Comments: Pt seems discouraged with multiple recent visits to hospital, initially thinking she is back at [facility]. Able to state September 2020, did not know date. Feels speech back to baseline; some slight slurring still noted by therapist (unsure baseline). Cognition WFL for simple tasks; following one-step commands appropriately      General Comments      Exercises     Assessment/Plan    PT Assessment Patient needs continued PT services  PT Problem List Decreased strength;Decreased activity tolerance;Decreased balance;Decreased mobility;Decreased knowledge of use of DME;Decreased knowledge of precautions;Pain       PT Treatment Interventions DME instruction;Gait training;Stair training;Functional mobility training;Therapeutic activities;Therapeutic exercise;Balance training;Patient/family education    PT Goals (Current goals can be found in the Care Plan section)  Acute Rehab PT Goals Patient Stated Goal: Return home with continued assist from family PT Goal Formulation: With patient Time For Goal Achievement: 05/01/19 Potential to Achieve Goals: Good    Frequency Min 3X/week   Barriers to discharge        Co-evaluation               AM-PAC PT "6 Clicks" Mobility  Outcome Measure Help needed turning from your back to your side while in a flat bed without using bedrails?: None Help needed moving from lying on your back to sitting on the side of a flat bed without using bedrails?: None Help needed moving to and from a bed to a chair  (including a wheelchair)?: A Lot Help needed standing up from a chair using your arms (e.g., wheelchair or bedside chair)?: A Lot Help needed to walk in hospital room?: A Lot Help needed climbing 3-5 steps with a railing? : A Lot 6 Click Score: 16    End of Session Equipment Utilized During Treatment: Gait belt Activity Tolerance: Patient tolerated treatment well;Patient limited by fatigue Patient left: in bed;with call bell/phone within reach;with bed alarm set(with staff member for ultrasound) Nurse Communication: Mobility status PT Visit Diagnosis: Other abnormalities of gait and mobility (R26.89);Difficulty in walking, not elsewhere classified (R26.2)    Time: 1308-6578 PT Time Calculation (min) (ACUTE ONLY): 26 min   Charges:   PT Evaluation $PT Eval Moderate Complexity: 1 Mod PT Treatments $Therapeutic Activity: 8-22 mins   Mabeline Caras, PT, DPT Acute Rehabilitation Services  Pager [phone] Office [place] 04/17/2019, 5:23 PM

## 2019-04-17 NOTE — ED Notes (Signed)
Informed receiving nurse at [facility] that pt still needs K+

## 2019-04-17 NOTE — ED Notes (Signed)
Wasted 1mg  ativan w/ Amy reed, 2W RN. meds drawn up in ED for MRI.

## 2019-04-17 NOTE — Plan of Care (Signed)
  Problem: Activity: Goal: Risk for activity intolerance will decrease Outcome: Progressing   Problem: Nutrition: Goal: Adequate nutrition will be maintained Outcome: Progressing   

## 2019-04-17 NOTE — Progress Notes (Signed)
Pharmacy Antibiotic Note  Betty Golden is a 73 y.o. female admitted on 04/16/2019 with COVID, possible TIA.  Pharmacy has been consulted for Remdesivir dosing.  CXR shows aypical pna Pt with sats <94% on RA ALT 39  Plan: Remdesivir 200mg  IV now then 100mg  IV daily x 4 days Will f/u ALT and pt's clinical condition   Height: 5\' 5"  (165.1 cm) Weight: 161 lb (73 kg) IBW/kg (Calculated) : 57  Temp (24hrs), Avg:100.2 F (37.9 C), Min:98.8 F (37.1 C), Max:101.5 F (38.6 C)  Recent Labs  Lab 04/16/19 1758  WBC 10.6*  CREATININE 1.52*    Estimated Creatinine Clearance: 34 mL/min (A) (by C-G formula based on SCr of 1.52 mg/dL (H)).    Allergies  Allergen Reactions  . Sulfa Antibiotics Rash    Antimicrobials this admission: 9/19 Remdesivir >> 9/23   Thank you for allowing pharmacy to be a part of this patient's care.  Sherlon Handing, PharmD, BCPS 04/17/2019 3:58 AM

## 2019-04-17 NOTE — Progress Notes (Addendum)
PROGRESS NOTE  Betty Golden  DOB: 1946-06-13  PCP: Quentin Cornwall, MD HYW:[id number]  DOA: 04/16/2019  LOS: 1 day   Brief narrative: Patient is a 73 y.o. female, with history of hypertension, hyperlipidemia, GERD, IBS, history of C. difficile, fibromyalgia, rheumatoid arthritis, asthma/COPD who was recently hospitalized at [facility] (9/5 -9/8) for COVID pneumonia.  At that time, patient was treated with low-dose steroids and did not require Remdesevir. As per patient's daughter patient has been feeling dizzy at home also complained of generalized weakness.  She had a fall on 9/9 and has headache since then.  At that time patient was evaluated and was diagnosed with acute mildly displaced fractures of second third and fourth metatarsal necks. Patient was discharged from the ED to follow-up with orthopedics as outpatient. Patient continued to have weakness at home. 9/18, patient had slurred speech, right-sided facial droop as well.  Complains of cough but unable to cough up any phlegm. Patient was febrile in the ED with T-max 101.5 COVID-19 test positive again.  Tele neurology was consulted, and patient underwent CT head which showed no stroke.  Also CTA head and neck was performed which showed no large vessel occlusion Patient was admitted for further evaluation and management.   Subjective: Patient was seen and examined this morning.  Elderly female, lying down in bed.  Not in distress.  No new symptoms. She still seems to mild slurring of speech but no other focal deficits in extremities.  Assessment/Plan: TIA -Presented with slurring of speech, right facial droop. -CT scan of head and CT angios head and neck as above.   -MRI brain showed no acute abnormality but mild chronic small vessel ischemia improving chronic left internal capsule lacunar infarct -stroke work-up including echocardiogram, PT/OT consulted. -At home, patient was on statin.  Aspirin 325 mg daily given.  Start 81 mg  daily from tomorrow  Fever/COVID 19 pneumonia -Temperature 101.5 at presentation.  -patient was recently hospitalized (9/5-9/8) with COVID infection and treated with low-dose steroids.  On 04/04/2019, and was discharged on 04/06/2019 after she was treated with low-dose steroids.   COVID-19 test positive again. -Chest x-ray showed interim development of moderate right greater than left peripheral and basilar airspace disease felt consistent with atypical pneumonia. -On admission, patient was started on around severe and oral steroids. -Currently oxygen saturation 97% on room air.  Hypertension -Home meds include amlodipine, Coreg, hydralazine, Imdur, losartan, HCTZ -MRI negative for stroke.  No need of permissive hypertension. -Blood pressure in 120s today.  Will resume Coreg and Imdur for now.  Others remain on hold.  AKI -Presented with a creatinine of 1.52, baseline normal -Start on gentle IV hydration.  Keep losartan and HCTZ on hold.  Fibromyalgia-continue gabapentin, duloxetine  Rheumatoid arthritis-patient is on Humira as outpatient.  Continue methotrexate every 7 days.  Next dose due is on Thursday  Hypokalemia-replace potassium.  Follow BMP in a.m.  Mobility: PT eval DVT prophylaxis:  Lovenox Code Status:   Code Status: Full Code  Family Communication:  Expected Discharge:  Pending clinical course  Consultants:    Procedures:    Antimicrobials: Anti-infectives (From admission, onward)   Start     Dose/Rate Route Frequency Ordered Stop   04/18/19 1000  remdesivir 100 mg in sodium chloride 0.9 % 250 mL IVPB     100 mg 500 mL/hr over 30 Minutes Intravenous Every 24 hours 04/17/19 0437 04/22/19 0959   04/17/19 0600  remdesivir 200 mg in sodium chloride 0.9 % 250  mL IVPB     200 mg 500 mL/hr over 30 Minutes Intravenous Once 04/17/19 0437 04/17/19 0658      Diet Order            Diet Heart Room service appropriate? Yes; Fluid consistency: Thin  Diet effective now               Infusions:  . sodium chloride    . sodium chloride 100 mL/hr at 04/17/19 0352  . potassium chloride    . [START ON 04/18/2019] remdesivir 100 mg in NS 250 mL      Scheduled Meds: . [START ON 04/18/2019] aspirin  81 mg Oral Daily  . dexamethasone (DECADRON) injection  6 mg Intravenous Q24H  . DULoxetine  20 mg Oral BID  . enoxaparin (LOVENOX) injection  40 mg Subcutaneous Q24H  . fluticasone  1 puff Inhalation BID  . isosorbide mononitrate  30 mg Oral Daily  . [START ON 04/22/2019] methotrexate  7.5 mg Oral Weekly  . simvastatin  20 mg Oral QHS    PRN meds: acetaminophen **OR** acetaminophen (TYLENOL) oral liquid 160 mg/5 mL **OR** acetaminophen, albuterol   Objective: Vitals:   04/17/19 0301 04/17/19 0743  BP: (!) 127/57 (!) 123/55  Pulse: 83 74  Resp: 16 13  Temp: 98.8 F (37.1 C) 98.5 F (36.9 C)  SpO2: 93% 97%    Intake/Output Summary (Last 24 hours) at 04/17/2019 1519 Last data filed at 04/17/2019 0742 Gross per 24 hour  Intake 559.02 ml  Output 0 ml  Net 559.02 ml   Filed Weights   04/16/19 1823  Weight: 73 kg   Weight change:  Body mass index is 26.79 kg/m.   Physical Exam: General exam: Appears calm and comfortable.  Skin: No rashes, lesions or ulcers. HEENT: Atraumatic, normocephalic, supple neck, no obvious bleeding Lungs: Clear to auscultation bilaterally CVS: Regular rate and rhythm, no murmur GI/Abd soft, nontender, nondistended, bowel sound present CNS: Alert, awake, oriented x3 except for that patient keeps confused between [facility] and [facility] Psychiatry: Mood appropriate Extremities: No pedal edema, no calf tenderness  Data Review: I have personally reviewed the laboratory data and studies available.  Recent Labs  Lab 04/16/19 1758  WBC 10.6*  NEUTROABS 7.1  HGB 12.2  HCT 38.0  MCV 87.4  PLT 290   Recent Labs  Lab 04/16/19 1758  NA 133*  K 3.1*  CL 94*  CO2 26  GLUCOSE 114*  BUN 33*  CREATININE 1.52*  CALCIUM  8.8*    Lorin Glass, MD  [facility] 04/17/2019

## 2019-04-17 NOTE — Progress Notes (Signed)
Drew up and wasted 1mg  Ativan in ED with Park Breed, RN for administration in MRI dept.

## 2019-04-18 LAB — BASIC METABOLIC PANEL
Anion gap: 11 (ref 5–15)
Anion gap: 12 (ref 5–15)
BUN: 41 mg/dL — ABNORMAL HIGH (ref 8–23)
BUN: 44 mg/dL — ABNORMAL HIGH (ref 8–23)
CO2: 23 mmol/L (ref 22–32)
CO2: 23 mmol/L (ref 22–32)
Calcium: 9.3 mg/dL (ref 8.9–10.3)
Calcium: 9.2 mg/dL (ref 8.9–10.3)
Chloride: 105 mmol/L (ref 98–111)
Chloride: 103 mmol/L (ref 98–111)
Creatinine, Ser: 1.5 mg/dL — ABNORMAL HIGH (ref 0.44–1.00)
Creatinine, Ser: 1.72 mg/dL — ABNORMAL HIGH (ref 0.44–1.00)
GFR calc Af Amer: 40 mL/min — ABNORMAL LOW (ref >60)
GFR calc Af Amer: 34 mL/min — ABNORMAL LOW (ref >60)
GFR calc non Af Amer: 35 mL/min — ABNORMAL LOW (ref >60)
GFR calc non Af Amer: 29 mL/min — ABNORMAL LOW (ref >60)
Glucose, Bld: 163 mg/dL — ABNORMAL HIGH (ref 70–99)
Glucose, Bld: 175 mg/dL — ABNORMAL HIGH (ref 70–99)
Potassium: 3 mmol/L — ABNORMAL LOW (ref 3.5–5.1)
Potassium: 2.9 mmol/L — ABNORMAL LOW (ref 3.5–5.1)
Sodium: 139 mmol/L (ref 135–145)
Sodium: 138 mmol/L (ref 135–145)

## 2019-04-18 LAB — LIPID PANEL
Cholesterol: 147 mg/dL (ref 0–200)
HDL: 32 mg/dL — ABNORMAL LOW (ref >40)
LDL Cholesterol: 101 mg/dL — ABNORMAL HIGH (ref 0–99)
Total CHOL/HDL Ratio: 4.6 RATIO
Triglycerides: 71 mg/dL (ref <150)
VLDL: 14 mg/dL (ref 0–40)

## 2019-04-18 LAB — URINALYSIS, ROUTINE W REFLEX MICROSCOPIC
Bacteria, UA: NONE SEEN
Bilirubin Urine: NEGATIVE
Glucose, UA: NEGATIVE mg/dL
Hgb urine dipstick: NEGATIVE
Ketones, ur: NEGATIVE mg/dL
Nitrite: NEGATIVE
Protein, ur: 30 mg/dL — AB
Specific Gravity, Urine: 1.038 — ABNORMAL HIGH (ref 1.005–1.030)
pH: 5 (ref 5.0–8.0)

## 2019-04-18 LAB — CBC WITH DIFFERENTIAL/PLATELET
Abs Immature Granulocytes: 0.04 10*3/uL (ref 0.00–0.07)
Basophils Absolute: 0 10*3/uL (ref 0.0–0.1)
Basophils Relative: 0 %
Eosinophils Absolute: 0 10*3/uL (ref 0.0–0.5)
Eosinophils Relative: 0 %
HCT: 33 % — ABNORMAL LOW (ref 36.0–46.0)
Hemoglobin: 11.3 g/dL — ABNORMAL LOW (ref 12.0–15.0)
Immature Granulocytes: 1 %
Lymphocytes Relative: 20 %
Lymphs Abs: 1.6 10*3/uL (ref 0.7–4.0)
MCH: 29 pg (ref 26.0–34.0)
MCHC: 34.2 g/dL (ref 30.0–36.0)
MCV: 84.6 fL (ref 80.0–100.0)
Monocytes Absolute: 0.3 10*3/uL (ref 0.1–1.0)
Monocytes Relative: 4 %
Neutro Abs: 5.9 10*3/uL (ref 1.7–7.7)
Neutrophils Relative %: 75 %
Platelets: 298 10*3/uL (ref 150–400)
RBC: 3.9 MIL/uL (ref 3.87–5.11)
RDW: 15.4 % (ref 11.5–15.5)
WBC: 7.8 10*3/uL (ref 4.0–10.5)
nRBC: 0 % (ref 0.0–0.2)

## 2019-04-18 LAB — CREATININE, URINE, RANDOM: Creatinine, Urine: 108.14 mg/dL

## 2019-04-18 LAB — AMMONIA: Ammonia: 18 umol/L (ref 9–35)

## 2019-04-18 LAB — PROTEIN / CREATININE RATIO, URINE
Creatinine, Urine: 107.42 mg/dL
Protein Creatinine Ratio: 0.53 mg/mg{Cre} — ABNORMAL HIGH (ref 0.00–0.15)
Total Protein, Urine: 57 mg/dL

## 2019-04-18 LAB — SODIUM, URINE, RANDOM: Sodium, Ur: 12 mmol/L

## 2019-04-18 LAB — HEMOGLOBIN A1C
Hgb A1c MFr Bld: 6.1 % — ABNORMAL HIGH (ref 4.8–5.6)
Mean Plasma Glucose: 128.37 mg/dL

## 2019-04-18 LAB — LACTIC ACID, PLASMA: Lactic Acid, Venous: 3.2 mmol/L (ref 0.5–1.9)

## 2019-04-18 MED ORDER — POTASSIUM CHLORIDE CRYS ER 20 MEQ PO TBCR
40.0000 meq | EXTENDED_RELEASE_TABLET | Freq: Once | ORAL | Status: AC
  Administered 2019-04-18: 40 meq via ORAL
  Filled 2019-04-18: qty 2

## 2019-04-18 MED ORDER — SODIUM CHLORIDE 0.9 % IV SOLN
INTRAVENOUS | Status: DC
  Administered 2019-04-18 – 2019-04-20 (×3): via INTRAVENOUS

## 2019-04-18 NOTE — Progress Notes (Signed)
Alert with intermittent confusion, no complaint of headache, blurred vision or dizziness, VSS. Patient repositioned and made comfortable-Patient Bladder scan due to no urine output noted since onset of shift-no significant amount noted, verified by Nurse Lilia Pro -volume noted to be zero.will recheck.

## 2019-04-18 NOTE — Significant Event (Signed)
No urine output in last 24 hours on purwick.  Bladder scan shows no urine in bladder. Creatinine normal at baseline, 1.52 at presentation yesterday, has been on normal saline at 75 mils per hour since then.   Creatinine 1.5 at 8 AM this morning, repeat BMP at 2 PM this afternoon shows 1.72. Increased normal saline to 125 mL/h. Ordered for renal ultrasound.  UA and urine lites. Repeat BMP in am.

## 2019-04-18 NOTE — Progress Notes (Signed)
Inserted 32fr latex foley catheter, per order. Only 63ml clear yellow urine immediately returned. Patient remains on NS @ 125cc/hr. Updated Dr. Pietro Cassis via secure message. Will continue to monitor.

## 2019-04-18 NOTE — Progress Notes (Signed)
Received in bed asleep but easily awaken oriented to person and place, Patient VSS,NSR on Telemetry. Foley to gravity no urine noted since onset of shift, physician paged and made aware,, Patient continues on IFL NS@125cc /hr , line intact and patent. Will continue to observe and monitor for changes.

## 2019-04-18 NOTE — Progress Notes (Signed)
Occupational Therapy Evaluation Patient Details Name: Betty Golden MRN: [id number] DOB: Jun 14, 1946 Today's Date: 04/18/2019    History of Present Illness Pt is a 73 y.o. female with recent admision to [facility] (9/5-04/06/19) with COVID, then with fall 04/07/19 sustaining 2-4 metatarsal fxs with d/c from ED, now admitted 04/16/19 with slurred speech and R-side facial droop. Pt tested postive COVID-19 again. MRI showed no acute abnormality; mild chronic small vessel ischemia improving chronic left internal capsule lacunar infarct. Worked up for TIA. Other PMH includes HTN, HLD, IBS, C.diff, fibromyalgia, RA, asthma, COPD.   Clinical Impression   PTA, pt living at home with family since DC from [place]. Since fall, family has been assisting with ADL and mobility. Prior to [place], pt independent with ADL., mobility and managed her own finances and medication, but lives with her family. Pt became dizzy one mobilized to EOB with min A and increased time for problem solving and processing. Increased dizziness with standing - most likely orthostatic. Pt with overall deconditioning and feel cognition is not at baseline - will further assess. Pt will need 24/7 assistance to DC home safely with HHOT. Recommend pt OOB dialy with nursing staff. Will keep pt NWB RLE at this time. Will follow acutely.  Would be beneficial to have ortho consult on R foot to determine if pt is allowed a less limited WBS with a CAM boot to increase independence with mobility and ADL.    Follow Up Recommendations  Home health OT;Supervision/Assistance - 24 hour    Equipment Recommendations  3 in 1 bedside commode;Wheelchair (measurements OT);Wheelchair cushion (measurements OT)    Recommendations for Other Services       Precautions / Restrictions Precautions Precautions: Fall Precaution Comments: reports uses RW vs cane at all times (RW does not go in her bathroom) Restrictions RLE Weight Bearing: Non weight bearing Other  Position/Activity Restrictions: Assume R foot NWB due to recent 2nd-4th metatarsal fxs 04/07/19      Mobility Bed Mobility Overal bed mobility: Needs Assistance Bed Mobility: Supine to Sit;Sit to Supine     Supine to sit: Min assist Sit to supine: Min assist   General bed mobility comments: VC for problem solving how to progress to EOB  Transfers Overall transfer level: Needs assistance Equipment used: Rolling walker (2 wheeled) Transfers: Sit to/from Stand Sit to Stand: Mod assist              Balance     Sitting balance-Leahy Scale: Good       Standing balance-Leahy Scale: Poor                             ADL either performed or assessed with clinical judgement   ADL Overall ADL's : Needs assistance/impaired     Grooming: Set up;Sitting   Upper Body Bathing: Set up;Sitting   Lower Body Bathing: Moderate assistance;Sit to/from stand   Upper Body Dressing : Set up;Sitting   Lower Body Dressing: Moderate assistance;Sit to/from stand   Toilet Transfer: Moderate assistance;RW Toilet Transfer Details (indicate cue type and reason): simulated sit - stnad Toileting- Clothing Manipulation and Hygiene: Maximal assistance Toileting - Clothing Manipulation Details (indicate cue type and reason): foley     Functional mobility during ADLs: Moderate assistance;Rolling walker(only able to tolerate sit - stnd)       Vision         Perception     Praxis      Pertinent Vitals/Pain  Pain Assessment: No/denies pain     Hand Dominance Right   Extremity/Trunk Assessment Upper Extremity Assessment Upper Extremity Assessment: Generalized weakness   Lower Extremity Assessment Lower Extremity Assessment: Defer to PT evaluation;RLE deficits/detail RLE Deficits / Details: s/p metatarsal fxs in ace wrap; hip and knee functionally at least 3/5   Cervical / Trunk Assessment Cervical / Trunk Assessment: Normal   Communication  Communication Communication: HOH   Cognition Arousal/Alertness: Awake/alert Behavior During Therapy: WFL for tasks assessed/performed Overall Cognitive Status: Impaired/Different from baseline Area of Impairment: Orientation;Attention;Safety/judgement;Awareness;Problem solving                 Orientation Level: Disoriented to;Time;Place Current Attention Level: Selective     Safety/Judgement: Decreased awareness of safety;Decreased awareness of deficits Awareness: Emergent Problem Solving: Slow processing General Comments: Pt continues to think she is at [facility] although she has been reoirented to [facility]. Aware that it is mid September. Increased time for prociessing. Need to further assess   General Comments  complaining of dizziness with transition to sitting EOB. most likely orthostatic    Exercises Exercises: Other exercises Other Exercises Other Exercises: encouraged BUE AROM   Shoulder Instructions      Home Living Family/patient expects to be discharged to:: Private residence Living Arrangements: Children;Other relatives Available Help at Discharge: Family;Available 24 hours/day Type of Home: House Home Access: Stairs to enter [employer] of Steps: 2   Home Layout: One level     Bathroom Shower/Tub: Chief Strategy Officer: Handicapped height Bathroom Accessibility: No   Home Equipment: Environmental consultant - 2 wheels;Cane - single point;Wheelchair - manual          Prior Functioning/Environment Level of Independence: Needs assistance  Gait / Transfers Assistance Needed: Prior to initial injury, pt mod indep with RW. Since breaking foot, has required assist from family for standing and transfers, using RW and w/c ADL's / Homemaking Assistance Needed: Prior to injury, mod indep with basic ADLs. Since breaking foot, daughter assist with bed baths, pt uses bed pan/Depends/assist to [facility]   Comments: pt managed her own finances adn medicaiton         OT Problem List: Decreased strength;Decreased activity tolerance;Impaired balance (sitting and/or standing);Decreased cognition;Decreased safety awareness;Decreased knowledge of use of DME or AE;Cardiopulmonary status limiting activity;Pain      OT Treatment/Interventions: Self-care/ADL training;Therapeutic exercise;Energy conservation;DME and/or AE instruction;Therapeutic activities;Cognitive remediation/compensation;Patient/family education;Balance training    OT Goals(Current goals can be found in the care plan section) Acute Rehab OT Goals Patient Stated Goal: Return home with continued assist from family OT Goal Formulation: With patient Time For Goal Achievement: 05/02/19 Potential to Achieve Goals: Good  OT Frequency: Min 3X/week   Barriers to D/C:            Co-evaluation              AM-PAC OT "6 Clicks" Daily Activity     Outcome Measure Help from another person eating meals?: None Help from another person taking care of personal grooming?: A Little Help from another person toileting, which includes using toliet, bedpan, or urinal?: A Lot Help from another person bathing (including washing, rinsing, drying)?: A Lot Help from another person to put on and taking off regular upper body clothing?: A Little Help from another person to put on and taking off regular lower body clothing?: A Lot 6 Click Score: 16   End of Session Equipment Utilized During Treatment: Rolling walker Nurse Communication: Mobility status  Activity Tolerance: Patient tolerated  treatment well Patient left: in bed;with call bell/phone within reach  OT Visit Diagnosis: Unsteadiness on feet (R26.81);Other abnormalities of gait and mobility (R26.89);Muscle weakness (generalized) (M62.81);History of falling (Z91.81);Other symptoms and signs involving cognitive function;Dizziness and giddiness (R42)                Time: 1730-1800 OT Time Calculation (min): 30 min Charges:  OT General Charges $OT  Visit: 1 Visit OT Evaluation $OT Eval Moderate Complexity: 1 Mod OT Treatments $Self Care/Home Management : 8-22 mins  Luisa DagoHilary Glendell Fouse, OT/L   Acute OT Clinical Specialist Acute Rehabilitation Services Pager [phone] Office [phone]   [facility] LLCWARD,HILLARY 04/18/2019, 6:37 PM

## 2019-04-18 NOTE — Progress Notes (Signed)
PROGRESS NOTE  Betty Golden  DOB: 05/07/1946  PCP: Quentin Cornwall, MD DVV:[id number]  DOA: 04/16/2019  LOS: 2 days   Brief narrative: Patient is a40 y.o.female,with history of hypertension, hyperlipidemia, GERD, IBS, history of C. difficile, fibromyalgia, rheumatoid arthritis, asthma/COPD who was recently hospitalized at [facility] (9/5 -9/8) for COVID pneumonia. At that time, patient was treated with low-dose steroids and did not require Remdesevir. As per patient's daughter patient has been feeling dizzy at home also complained of generalized weakness.  She had a fall on 9/9 and has headache since then.  At that time patient was evaluated and was diagnosed with acute mildly displaced fractures of second third and fourth metatarsal necks. Patient was discharged from the ED to follow-up with orthopedics as outpatient. Patient continued to have weakness at home. On 9/18, patient had slurred speech, right-sided facial droop as well. Complains of cough but unable to cough up any phlegm. In the ED, patient was febrile with T-max 101.5 COVID-19 test positive again.  Tele neurology was consulted,and patient underwent CT head which showed no stroke. Also CTA head and neck was performed which showed no large vessel occlusion Patient was admitted for further evaluation and management.  Subjective: Patient was seen and examined this morning.  Elderly female.  Lying down in bed.  Feels better.  On my evaluation, patient still had slurred speech like yesterday but otherwise not more confused.  Per RN, patient has not had any urine output in last 24 hours.  Assessment/Plan: TIA -Presented with slurring of speech, right facial droop. -CT scan of head and CT angios head and neck as above.   -MRI brain showed no acute abnormality but mild chronic small vessel ischemia improving chronic left internal capsule lacunar infarct. -Echocardiogram showed EF of 62%, LV diastolic filling impairment, no cardiac  source embolism. -PT/OT eval obtained.  Home health with 24-hour supervision recommended. -At home, patient was on statin.  Aspirin 325 mg daily given.  Start 81 mg daily from tomorrow  COVID 19 pneumonia -Presented with fever.  Temperature 101.5 at presentation.  -patient was recently hospitalized (9/5-9/8) with COVID infection and treated with low-dose steroids.  On 04/04/2019, and was discharged on 04/06/2019 after she was treated with low-dose steroids. COVID-19 test positive again. -Chest x-ray showed interim development of moderate right greater than left peripheral and basilar airspace disease felt consistent with atypical pneumonia. -On admission, patient was started on Remdesivir and oral steroids. -Currently oxygen saturation 97% on room air.  Hypertension -Home meds include amlodipine, Coreg, hydralazine, Imdur, losartan, HCTZ -MRI negative for stroke.  No need of permissive hypertension. -Blood pressure in 120s and 130s on Coreg and Imdur.  Keep all those on hold.  AKI/oliguria -Presented with a creatinine of 1.52, baseline normal, creatinine remains elevated over 1.5 today. -Keep losartan and HCTZ on hold. -RN reports minimal to no urine output in last 24 hours.  Bladder scan does not show any urine either.  Creatinine this morning and is almost the same as yesterday.   -Patient is at risk of contrast-induced nephropathy because she had CT angiogram done for stroke work-up. Repeat BMP.  Repeat lactic acid level. -Start on normal saline at 125 mils per hour for now.  Fibromyalgia -on gabapentin, duloxetine at home. Hold for now.  Rheumatoid arthritis -patient is on Humira as outpatient. Continue methotrexate every 7 days. Next dose due is on Thursday  Hypokalemia -potassium low at 3 today.  40 mEq of oral replacement given.  Acute mildly  displaced fractures of second third and fourth metatarsal necks -Patient had a fall at home on 9/9. -X-ray showed fractures as  above.  Cast applied. Patient was discharged from the ED to follow-up with orthopedics as outpatient.  Patient has no follow-up in last 10 days.  We will get her seen by orthopedics while in the hospital.   Mobility: PT eval DVT prophylaxis: Lovenox Code Status:  Code Status: Full Code  Family Communication: Expected Discharge: Pending clinical course  Consultants:    Procedures:    Antimicrobials: Anti-infectives (From admission, onward)   Start     Dose/Rate Route Frequency Ordered Stop   04/18/19 1000  remdesivir 100 mg in sodium chloride 0.9 % 250 mL IVPB     100 mg 500 mL/hr over 30 Minutes Intravenous Every 24 hours 04/17/19 0437 04/22/19 0959   04/17/19 0600  remdesivir 200 mg in sodium chloride 0.9 % 250 mL IVPB     200 mg 500 mL/hr over 30 Minutes Intravenous Once 04/17/19 0437 04/17/19 0658      Diet Order            Diet Heart Room service appropriate? Yes; Fluid consistency: Thin  Diet effective now              Infusions:  . sodium chloride 125 mL/hr at 04/18/19 1433  . remdesivir 100 mg in NS 250 mL 100 mg (04/18/19 1230)    Scheduled Meds: . aspirin  81 mg Oral Daily  . carvedilol  6.25 mg Oral BID WC  . dexamethasone (DECADRON) injection  6 mg Intravenous Q24H  . DULoxetine  20 mg Oral BID  . enoxaparin (LOVENOX) injection  40 mg Subcutaneous Q24H  . fluticasone  1 puff Inhalation BID  . gabapentin  400 mg Oral TID  . isosorbide mononitrate  30 mg Oral Daily  . [START ON 04/22/2019] methotrexate  7.5 mg Oral Weekly  . simvastatin  20 mg Oral QHS    PRN meds: acetaminophen **OR** acetaminophen (TYLENOL) oral liquid 160 mg/5 mL **OR** acetaminophen, albuterol   Objective: Vitals:   04/18/19 0826 04/18/19 1224  BP: 121/63 105/60  Pulse: 81 74  Resp: 18 16  Temp:  98.3 F (36.8 C)  SpO2: 95% 94%    Intake/Output Summary (Last 24 hours) at 04/18/2019 1437 Last data filed at 04/18/2019 0900 Gross per 24 hour  Intake 824.98 ml   Output 0 ml  Net 824.98 ml   Filed Weights   04/16/19 1823  Weight: 73 kg   Weight change:  Body mass index is 26.79 kg/m.   Physical Exam: General exam: Appears calm and comfortable.  Elderly female Skin: No rashes, lesions or ulcers. HEENT: Atraumatic, normocephalic, supple neck, no obvious bleeding Lungs: Clear to auscultation bilaterally CVS: Regular rate and rhythm, no murmur GI/Abd soft, nontender, nondistended, bowel sound present CNS: Alert, awake, oriented to place and person, slow to respond Psychiatry: Mood appropriate Extremities: No pedal edema.  No calf tenderness right foot on cast.  Data Review: I have personally reviewed the laboratory data and studies available.  Recent Labs  Lab 04/16/19 1758 04/18/19 0756  WBC 10.6* 7.8  NEUTROABS 7.1 5.9  HGB 12.2 11.3*  HCT 38.0 33.0*  MCV 87.4 84.6  PLT 290 298   Recent Labs  Lab 04/16/19 1758 04/18/19 0756  NA 133* 139  K 3.1* 3.0*  CL 94* 105  CO2 26 23  GLUCOSE 114* 163*  BUN 33* 41*  CREATININE 1.52* 1.50*  CALCIUM 8.8* 9.3    Lorin Glass, MD  [facility] 04/18/2019

## 2019-04-18 NOTE — Progress Notes (Signed)
Patient is complaining of dizziness when sitting up. Slow to arouse this morning. She has mild ataxia this morning and keeps missing her mouth with pills/straw. Does not appear to have any one-sided weakness. She is able to push herself up in bed. Speech is still mildly slurred. Sent secure message to MD about neuro status and lack of urine output. Will continue to monitor.

## 2019-04-18 NOTE — Progress Notes (Signed)
Purewick slipped and did not absorb any output. Patient wet her depend brief that she had on over it. The brief did not leak, but was moderately heavy. Patient remains on NS @ 125cc/hr.

## 2019-04-18 NOTE — Progress Notes (Signed)
Notified by Kirtland Bouchard in the lab that patient's lactic acid is critical at 3.2. Also noted that K+ is 2.9 and renal function is worsening. Notified Dr. Pietro Cassis.

## 2019-04-18 NOTE — Progress Notes (Addendum)
Patient is mildly confused and more agitated now. She was twisted up in her gown and had her legs toward the edge of the bed and had thrown her covers off. She could not clearly communicate what she was trying to do. Bed alarm activated. Bed alarm did not go off. Patient became more oriented with conversation. Unsure if this is something neurological with her infarct or if she is getting hospital dementia. She has still not urinated. Her belly is looking distended and she put her hand over it and winced. I scanned her bladder again several times and each time the reading was zero. I have notified Dr. Pietro Cassis via secure message.

## 2019-04-19 ENCOUNTER — Inpatient Hospital Stay (HOSPITAL_COMMUNITY): Payer: Medicare Other

## 2019-04-19 LAB — COMPREHENSIVE METABOLIC PANEL
ALT: 40 U/L (ref 0–44)
AST: 41 U/L (ref 15–41)
Albumin: 2.3 g/dL — ABNORMAL LOW (ref 3.5–5.0)
Alkaline Phosphatase: 38 U/L (ref 38–126)
Anion gap: 12 (ref 5–15)
BUN: 37 mg/dL — ABNORMAL HIGH (ref 8–23)
CO2: 20 mmol/L — ABNORMAL LOW (ref 22–32)
Calcium: 8.6 mg/dL — ABNORMAL LOW (ref 8.9–10.3)
Chloride: 108 mmol/L (ref 98–111)
Creatinine, Ser: 1.25 mg/dL — ABNORMAL HIGH (ref 0.44–1.00)
GFR calc Af Amer: 50 mL/min — ABNORMAL LOW (ref >60)
GFR calc non Af Amer: 43 mL/min — ABNORMAL LOW (ref >60)
Glucose, Bld: 128 mg/dL — ABNORMAL HIGH (ref 70–99)
Potassium: 3.1 mmol/L — ABNORMAL LOW (ref 3.5–5.1)
Sodium: 140 mmol/L (ref 135–145)
Total Bilirubin: 0.2 mg/dL — ABNORMAL LOW (ref 0.3–1.2)
Total Protein: 6.2 g/dL — ABNORMAL LOW (ref 6.5–8.1)

## 2019-04-19 LAB — LACTIC ACID, PLASMA: Lactic Acid, Venous: 1.1 mmol/L (ref 0.5–1.9)

## 2019-04-19 LAB — AMMONIA: Ammonia: 24 umol/L (ref 9–35)

## 2019-04-19 MED ORDER — POTASSIUM CHLORIDE CRYS ER 20 MEQ PO TBCR
40.0000 meq | EXTENDED_RELEASE_TABLET | Freq: Once | ORAL | Status: AC
  Administered 2019-04-19: 40 meq via ORAL
  Filled 2019-04-19: qty 2

## 2019-04-19 NOTE — Evaluation (Signed)
Speech Language Pathology Evaluation Patient Details Name: Betty Golden MRN: [id number] DOB: 1945/10/20 Today's Date: 04/19/2019 Time: 2751-7001 SLP Time Calculation (min) (ACUTE ONLY): 18 min  Problem List:  Patient Active Problem List   Diagnosis Date Noted  . TIA (transient ischemic attack) 04/16/2019  . Dyspnea 04/04/2019  . COVID-19 virus infection 04/04/2019  . Gastritis and gastroduodenitis 07/08/2018  . Rectal bleeding 06/29/2018  . Fibromyalgia 09/08/2017  . Spinal stenosis of lumbar region 09/20/2015  . 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 Moderate persistent asthma 07/03/2015  . Allergic rhinitis 07/03/2015  . Colitis due to Clostridium difficile 02/10/2015  . Abdominal pain 02/10/2015  . Nausea and vomiting 02/10/2015  . Acute renal failure (HCC) 02/10/2015  . Dehydration 02/10/2015  . Hypoxia 02/10/2015  . Hyponatremia 02/10/2015  . Hypokalemia 02/10/2015  . COPD (chronic obstructive pulmonary disease) (HCC)   . Unspecified constipation 07/15/2013  . DYSPNEA 04/05/2008  . Essential hypertension 03/10/2008  . Headache(784.0) 03/10/2008  . Cough 03/10/2008   Past Medical History:  Past Medical History:  Diagnosis Date  . Arthritis   . Asthma   . C. difficile colitis   . Cervical stenosis of spine   . Chronic headaches   . Chronic neck pain   . Coronary artery disease   . Depression   . Diverticulosis   . Essential hypertension   . Fibromyalgia   . GERD (gastroesophageal reflux disease)   . History of Salmonella gastroenteritis   . HNP (herniated nucleus pulposus), lumbar   . Hyperlipidemia   . IBS (irritable bowel syndrome)   . Lung nodule    Past Surgical History:  Past Surgical History:  Procedure Laterality Date  . APPENDECTOMY  1965  . BIOPSY  08/26/2018   Procedure: BIOPSY;  Surgeon: Malissa Hippo, MD;  Location: [facility];  Service: Endoscopy;;  gastric  . BREAST BIOPSY    . BREAST REDUCTION SURGERY  1994  . CHOLECYSTECTOMY  1975  .  COLONOSCOPY N/A 08/19/2013   Procedure: COLONOSCOPY;  Surgeon: Malissa Hippo, MD;  Location: [facility];  Service: Endoscopy;  Laterality: N/A;  155-moved to 140 Ann to notify pt  . COLONOSCOPY N/A 08/26/2018   Procedure: COLONOSCOPY;  Surgeon: Malissa Hippo, MD;  Location: [facility];  Service: Endoscopy;  Laterality: N/A;  . ESOPHAGOGASTRODUODENOSCOPY N/A 08/26/2018   Procedure: ESOPHAGOGASTRODUODENOSCOPY (EGD);  Surgeon: Malissa Hippo, MD;  Location: [facility];  Service: Endoscopy;  Laterality: N/A;  . fibromyalgia    . LUMBAR LAMINECTOMY/DECOMPRESSION MICRODISCECTOMY Bilateral 09/20/2015   Procedure: MICRO LUMBAR DECOMPRESSION L5-S1 BILATERAL    (1 LEVEL);  Surgeon: [name], MD;  Location: [facility];  Service: Orthopedics;  Laterality: Bilateral;  . rheumatoid arthritis    . Sinus Surgery    . TONSILLECTOMY  1968   HPI:  Pt is a 73 y.o. female with recent admision to [facility] (9/5-04/06/19) with COVID, then with fall 04/07/19 sustaining 2-4 metatarsal fxs with d/c from ED, now admitted 04/16/19 with slurred speech and R-side facial droop. Pt tested postive COVID-19 again. MRI showed no acute abnormality; mild chronic small vessel ischemia improving chronic left internal capsule lacunar infarct. Worked up for TIA. Other PMH includes HTN, HLD, IBS, C.diff, fibromyalgia, RA, asthma, COPD. Prior to COVID, pt independent with ADL, mobility and managed her own finances and medication, but lives with her family.    Assessment / Plan / Recommendation Clinical Impression  Per OT note (who is familiar with pt from [facility]  hospital) pt was independent prior to initial diagnosis of Covid, lived with family but independent with medication management and finances. She was pragmatically appropriate, speech was 100% intelligible. Deficits on the [facility] were in the areas of working memory and attention. Her score was 22 out of 30. Verbal expression was within functional limits. Suspect her  anticipatory awareness may be impaired. OT is seeing pt while on acute venue and will defer to them for cogntive support at this time. Pt has support at home and recommend family supervise pt initially with finances and medications for accuracy.        SLP Assessment  SLP Recommendation/Assessment: All further Speech Lanaguage Pathology  needs can be addressed in the next venue of care SLP Visit Diagnosis: Cognitive communication deficit (R41.841)    Follow Up Recommendations  24 hour supervision/assistance    Frequency and Duration           SLP Evaluation Cognition  Overall Cognitive Status: Impaired/Different from baseline Arousal/Alertness: Awake/alert Orientation Level: Oriented X4 Attention: Sustained Sustained Attention: Appears intact Memory: Impaired Memory Impairment: Retrieval deficit Awareness: (question anticipatory and safety awareness) Problem Solving: (intact for items assessed) Safety/Judgment: (?)       Comprehension  Auditory Comprehension Overall Auditory Comprehension: Appears within functional limits for tasks assessed Visual Recognition/Discrimination Discrimination: Not tested Reading Comprehension Reading Status: Not tested    Expression Expression Primary Mode of Expression: Verbal Verbal Expression Overall Verbal Expression: Appears within functional limits for tasks assessed Pragmatics: No impairment Written Expression Dominant Hand: Right Written Expression: Not tested   Oral / Motor  Oral Motor/Sensory Function Overall Oral Motor/Sensory Function: Within functional limits Motor Speech Overall Motor Speech: Appears within functional limits for tasks assessed Intelligibility: Intelligible Motor Planning: Witnin functional limits   GO                    Houston Siren 04/19/2019, 5:00 PM   Orbie Pyo Jesten Cappuccio M.Ed Risk analyst [phone] Office [phone]

## 2019-04-19 NOTE — Progress Notes (Signed)
Patient to have Renal US at bedside, patient made aware-cooperative with procedure.[place] indicates full bladder, new catheter inserted in patient 1834ml output, patient continues to deny discomfort.Marland Kitchen

## 2019-04-19 NOTE — Progress Notes (Signed)
PROGRESS NOTE  Betty Golden  DOB: Feb 10, 1946  PCP: Zachery Dauer, MD HCW:[id number]  DOA: 04/16/2019  LOS: 3 days   Brief narrative: Patient is a73 y.o.female,with history of hypertension, hyperlipidemia, GERD, IBS, history of C. difficile, fibromyalgia, rheumatoid arthritis, asthma/COPD who was recently hospitalized at [facility] (9/5 -9/8) for COVID pneumonia. At that time, patient was treated with low-dose steroids and did not require Remdesevir. As per patient's daughter patient has been feeling dizzy at home also complained of generalized weakness.  She had a fall on 9/9 and has headache since then.  At that time patient was evaluated and was diagnosed with acute mildly displaced fractures of second third and fourth metatarsal necks. Patient was discharged from the ED to follow-up with orthopedics as outpatient. Patient continued to have weakness at home. On 9/18, patient had slurred speech, right-sided facial droop as well. Complains of cough but unable to cough up any phlegm. In the ED, patient was febrile with T-max 101.5 COVID-19 test positive again.  Tele neurology was consulted,and patient underwent CT head which showed no stroke. Also CTA head and neck was performed which showed no large vessel occlusion Patient was admitted for further evaluation and management.  Subjective: Patient was seen and examined this morning.  Elderly female.  Lying down in bed.  Feels better.   Her speech seems less slurred today.  Alert, awake, oriented x3.  Has a Foley catheter in draining clear urine.    Assessment/Plan: TIA -Presented with slurring of speech, right facial droop. -CT scan of head and CT angios head and neck as above.   -MRI brain showed no acute abnormality but mild chronic small vessel ischemia improving chronic left internal capsule lacunar infarct. -Echocardiogram showed EF of 55%, LV diastolic filling impairment, no cardiac source embolism. -PT/OT eval obtained.  Home  health with 24-hour supervision recommended. -At home, patient was on statin.  Has been started on aspirin 81 mg daily now.  COVID 19 pneumonia -Presented with fever. Temperature 101.5 at presentation.  -patient was recently hospitalized (9/5-9/8) with COVID infection and treated with low-dose steroids. -On 04/04/2019, and was discharged on 04/06/2019 after she was treated with low-dose steroids. COVID-19 test positive again. -Chest x-ray showed interim development of moderate right greater than left peripheral and basilar airspace disease felt consistent with atypical pneumonia. -On admission, patient was started on Remdesivir and oral steroids.  Day 4 today. -Currently oxygen saturation 97% on room air.  Lactic acid level improved.  Hypertension -Home meds include amlodipine, Coreg, hydralazine, Imdur, losartan, HCTZ -MRI negative for stroke.  No need of permissive hypertension. -Blood pressure in 120s and 130s on Coreg and Imdur.  Others remain on hold.  AKI -Presented with a creatinine of 1.52, baseline normal -Currently on normal saline at 125 mL/h.  Creatinine improving, 1.25 today.  Continue normal saline to reduce rate of 75 mils per hour. -Keep losartan and HCTZ on hold.  Acute urinary retention -9/20, patient was noted to have urinary retention.  Foley catheter was inserted. 1800 mL of clear urine drained. -I ordered for Foley removal and voiding trial for 9/22.    Fibromyalgia -on gabapentin, duloxetine at home.  Resume at discharge  Rheumatoid arthritis -patient is on Humira as outpatient. Continue methotrexate every 7 days. Next dose due is on Thursday  Hypokalemia -potassium 3.1 today.  40 mEq of oral potassium replacement given.  Acute mildly displaced fractures of second third and fourth metatarsal necks -Patient had a fall at home on 9/9. -  X-ray showed fractures as above.  Cast was applied by ED physician. Patient was discharged from the ED to follow-up with  orthopedics as outpatient.  Patient has no follow-up in last 10 days.   -Obtain repeat x-rays today.  No change.  Discussed with orthopedics PA Hilbert Odor.  Recommend nonweightbearing on that foot.  Patient is to follow-up with orthopedics as an outpatient in 2 weeks and ultimately may require surgery.  I ordered for ambulatory referral to orthopedics.  Mobility: PT eval pending DVT prophylaxis: Lovenox Code Status:  Code Status: Full Code  Family Communication: Expected Discharge: Pending PT eval, pending completion of Remdesevir  Consultants:    Procedures:    Antimicrobials: Anti-infectives (From admission, onward)   Start     Dose/Rate Route Frequency Ordered Stop   04/18/19 1000  remdesivir 100 mg in sodium chloride 0.9 % 250 mL IVPB     100 mg 500 mL/hr over 30 Minutes Intravenous Every 24 hours 04/17/19 0437 04/22/19 0959   04/17/19 0600  remdesivir 200 mg in sodium chloride 0.9 % 250 mL IVPB     200 mg 500 mL/hr over 30 Minutes Intravenous Once 04/17/19 0437 04/17/19 0658      Diet Order            Diet Heart Room service appropriate? Yes; Fluid consistency: Thin  Diet effective now              Infusions:  . sodium chloride 75 mL/hr at 04/19/19 0958  . remdesivir 100 mg in NS 250 mL 100 mg (04/19/19 1126)    Scheduled Meds: . aspirin  81 mg Oral Daily  . carvedilol  6.25 mg Oral BID WC  . dexamethasone (DECADRON) injection  6 mg Intravenous Q24H  . DULoxetine  20 mg Oral BID  . enoxaparin (LOVENOX) injection  40 mg Subcutaneous Q24H  . fluticasone  1 puff Inhalation BID  . isosorbide mononitrate  30 mg Oral Daily  . [START ON 04/22/2019] methotrexate  7.5 mg Oral Weekly  . simvastatin  20 mg Oral QHS    PRN meds: acetaminophen **OR** acetaminophen (TYLENOL) oral liquid 160 mg/5 mL **OR** acetaminophen, albuterol   Objective: Vitals:   04/18/19 2200 04/19/19 0759  BP: 115/65 133/64  Pulse: 70   Resp: 16 16  Temp: (!) 97.4 F (36.3  C) (!) 97.5 F (36.4 C)  SpO2: 95% 98%    Intake/Output Summary (Last 24 hours) at 04/19/2019 1341 Last data filed at 04/19/2019 1320 Gross per 24 hour  Intake 1265.75 ml  Output 2185 ml  Net -919.25 ml   Filed Weights   04/16/19 1823  Weight: 73 kg   Weight change:  Body mass index is 26.79 kg/m.   Physical Exam: General exam: Appears calm and comfortable.  Elderly female Skin: No rashes, lesions or ulcers. HEENT: Atraumatic, normocephalic, supple neck, no obvious bleeding Lungs: Clear to auscultation bilaterally CVS: Regular rate and rhythm, no murmur GI/Abd soft, nontender, nondistended, bowel sound present CNS: Alert, awake, oriented to place and person, slow to respond Psychiatry: Mood appropriate.  Judgment and insight clear Extremities: No pedal edema.  No calf tenderness right foot on cast.   Data Review: I have personally reviewed the laboratory data and studies available.  Recent Labs  Lab 04/16/19 1758 04/18/19 0756  WBC 10.6* 7.8  NEUTROABS 7.1 5.9  HGB 12.2 11.3*  HCT 38.0 33.0*  MCV 87.4 84.6  PLT 290 298   Recent Labs  Lab 04/16/19 1758 04/18/19  0756 04/18/19 1435 04/19/19 0545  NA 133* 139 138 140  K 3.1* 3.0* 2.9* 3.1*  CL 94* 105 103 108  CO2 26 23 23  20*  GLUCOSE 114* 163* 175* 128*  BUN 33* 41* 44* 37*  CREATININE 1.52* 1.50* 1.72* 1.25*  CALCIUM 8.8* 9.3 9.2 8.6*    Lorin GlassBinaya Anais Koenen, MD  [facility] 04/19/2019

## 2019-04-19 NOTE — Progress Notes (Signed)
Foley removed. Pt instructed to let RN staff know when she has to void.

## 2019-04-19 NOTE — Progress Notes (Signed)
PT Cancellation Note  Patient Details Name: Betty Golden MRN: [id number] DOB: 1946/03/16   Cancelled Treatment:    Reason Eval/Treat Not Completed: Other (comment).  Pt just up to and back from the [facility], now eating in bed.  Will hold today and see as able 9/22 at [facility].  04/19/2019  Betty Golden, [name] [phone]  (pager) [phone]  (office)   Betty Golden 04/19/2019, 6:30 PM

## 2019-04-20 DIAGNOSIS — I10 Essential (primary) hypertension: Secondary | ICD-10-CM

## 2019-04-20 DIAGNOSIS — J129 Viral pneumonia, unspecified: Secondary | ICD-10-CM

## 2019-04-20 MED ORDER — POTASSIUM CHLORIDE CRYS ER 20 MEQ PO TBCR
40.0000 meq | EXTENDED_RELEASE_TABLET | ORAL | Status: AC
  Administered 2019-04-20 (×2): 40 meq via ORAL
  Filled 2019-04-20 (×2): qty 2

## 2019-04-20 MED ORDER — POTASSIUM CHLORIDE CRYS ER 20 MEQ PO TBCR
40.0000 meq | EXTENDED_RELEASE_TABLET | Freq: Once | ORAL | Status: AC
  Administered 2019-04-20: 40 meq via ORAL
  Filled 2019-04-20: qty 2

## 2019-04-20 MED ORDER — DIPHENOXYLATE-ATROPINE 2.5-0.025 MG PO TABS
1.0000 | ORAL_TABLET | Freq: Four times a day (QID) | ORAL | Status: DC | PRN
  Administered 2019-04-20 – 2019-04-21 (×2): 1 via ORAL
  Filled 2019-04-20 (×3): qty 1

## 2019-04-20 NOTE — Progress Notes (Signed)
On call provider Bodenheimer paged re: 6 beats of v tach; pt states she has intermittent episodes where her "heart beats really fast" and she gets dizzy; states that this has been an ongoing issue since before her admission to [facility]. She states that she has not visited a doctor about this issue.

## 2019-04-20 NOTE — Progress Notes (Addendum)
Pt has has not voided since 2153. Bladder scanned with 266 measured in bladder. Pt refusing to get up to [facility] to attempt to urinate. Pt denies urge to void.

## 2019-04-20 NOTE — Progress Notes (Signed)
PT Cancellation Note  Patient Details Name: GRACIA SAGGESE MRN: [id number] DOB: 15-Aug-1945   Cancelled Treatment:    Reason Eval/Treat Not Completed: Patient declined,pt. States that she was just up to the [facility] and does not want to get up again.    Claretha Cooper 04/20/2019, 3:37 PM  [place] Pager [phone] Office [phone]

## 2019-04-20 NOTE — Progress Notes (Signed)
PROGRESS NOTE    Betty Golden  XYI:[id number] DOB: November 12, 1945 DOA: 04/16/2019 PCP: Zachery Dauer, MD    Brief Narrative:  73 year old female who presented with dizziness and slurred speech.  She does have significant past medical history for hypertension, dyslipidemia, GERD, fibromyalgia, rheumatoid arthritis, asthma/COPD who was recently discharged from [facility] due to SARS COVID-19 pneumonia (09/05 to 09/08).  Patient was treated with systemic steroids.  At home feeling dizzy and weak.  On the day of admission her speech became slurred, had right-sided facial droop and headache.  On her initial physical examination her temperature was one 101.5, blood pressure 115/35, heart rate 87, respiratory rate 25, oxygen saturation 90%.  She was nonfocal, her lungs are clear to auscultation bilaterally, heart S1-S2 present rhythmic, abdomen soft, no lower extremity edema.  SARS COVID-19 was positive.  Head CT had no acute changes.  Patient was admitted to the hospital with a working diagnosis of transitory ischemic attack.  Further work-up with CT angiography had no acute changes.  Brain MRI showed no acute abnormalities, improving chronic left internal capsule lacunar infarct.  Echocardiogram with normal LV systolic function.  She has been diagnosed with a viral pneumonia, started on Remdesivir and systemic steroids.  Assessment & Plan:   Principal Problem:   COVID-19 virus infection Active Problems:   Essential hypertension   COPD (chronic obstructive pulmonary disease) (HCC)   TIA (transient ischemic attack)   Viral pneumonia   1. Acute hypoxic respiratory failure due to SARS COVID 19 viral pneumonia. Patient with new infiltrates and hypoxemia on admission. Decision has been made to treat patient with Remdesivir and systemic steroids (dexamethasone). Plan to complete 5 doses, that will be on 9/23 (confirmed with pharmacy). If stable patient can be discharged at that point.    2. TIA. Patient with no further neurologic focality, her workup has been negative, continue with aspirin and atorvastatin. Continue with home health services at discharge.   3. HTN. Blood pressure has been well controlled, systolic 138 to 161, will continue with carvedilol and isosorbide, at home on hydralazine, losartan and hctz.   4. AKI with hypokalemia, and urinary retention. Renal function with serum cr down to 1,25 from 1,72. K at 3,1. Foley catheter has been removed yesterday, but since then has required in and out catheterization. Will continue close monitor, may need to replace catheter if persistent retention. Will hold on IV fluids for now. Continue K correction with Kcl, 40 meq x2 doses.   5. Right acute displaced Fx 2,3,4 metatarsal necks. Continue with non weightbearing, follow with orthopedics as out patient.    6. Fibromyalgia and RA. Will resume home regimen at discharge. Humira and methotrexate.   DVT prophylaxis:enoxaparin   Code Status: full Family Communication: no family at the bedside  Disposition Plan/ discharge barriers: pending completing 5 doses of Remdesivir in am.  Body mass index is 26.79 kg/m. Malnutrition Type:      Malnutrition Characteristics:      Nutrition Interventions:     RN Pressure Injury Documentation:     Consultants:     Procedures:     Antimicrobials:       Subjective: Patient continue to be very weak and deconditioned, no dyspnea or chest pain, no cough or sore throat, no nausea or vomiting.   Objective: Vitals:   04/19/19 1648 04/19/19 1951 04/19/19 2330 04/20/19 0739  BP: 130/71 139/67 138/78 (!) 161/79  Pulse: 77 73  86  Resp:  (!) 21 (!)  22 16  Temp: (!) 97.3 F (36.3 C) (!) 97.5 F (36.4 C) 98.2 F (36.8 C) 98.6 F (37 C)  TempSrc:  Oral Oral Oral  SpO2: 96% 96% 97% 97%  Weight:      Height:        Intake/Output Summary (Last 24 hours) at 04/20/2019 4193 Last data filed at 04/20/2019 7902 Gross per  24 hour  Intake -  Output 825 ml  Net -825 ml   Filed Weights   04/16/19 1823  Weight: 73 kg    Examination:   General: Not in pain or dyspnea  Neurology: Awake and alert, non focal  E ENT: mild pallor, no icterus, oral mucosa moist Cardiovascular: No JVD. S1-S2 present, rhythmic, no gallops, rubs, or murmurs. No lower extremity edema. Pulmonary:  Positive breath sounds bilaterally. Gastrointestinal. Abdomen with no organomegaly, non tender, no rebound or guarding Skin. No rashes Musculoskeletal: no joint deformities     Data Reviewed: I have personally reviewed following labs and imaging studies  CBC: Recent Labs  Lab 04/16/19 1758 04/18/19 0756  WBC 10.6* 7.8  NEUTROABS 7.1 5.9  HGB 12.2 11.3*  HCT 38.0 33.0*  MCV 87.4 84.6  PLT 290 298   Basic Metabolic Panel: Recent Labs  Lab 04/16/19 1758 04/18/19 0756 04/18/19 1435 04/19/19 0545  NA 133* 139 138 140  K 3.1* 3.0* 2.9* 3.1*  CL 94* 105 103 108  CO2 26 23 23  20*  GLUCOSE 114* 163* 175* 128*  BUN 33* 41* 44* 37*  CREATININE 1.52* 1.50* 1.72* 1.25*  CALCIUM 8.8* 9.3 9.2 8.6*   GFR: Estimated Creatinine Clearance: 41.3 mL/min (A) (by C-G formula based on SCr of 1.25 mg/dL (H)). Liver Function Tests: Recent Labs  Lab 04/16/19 1758 04/19/19 0545  AST 66* 41  ALT 39 40  ALKPHOS 41 38  BILITOT 0.5 0.2*  PROT 7.7 6.2*  ALBUMIN 3.1* 2.3*   No results for input(s): LIPASE, AMYLASE in the last 168 hours. Recent Labs  Lab 04/18/19 1435 04/19/19 0545  AMMONIA 18 24   Coagulation Profile: Recent Labs  Lab 04/16/19 1758  INR 1.1   Cardiac Enzymes: No results for input(s): CKTOTAL, CKMB, CKMBINDEX, TROPONINI in the last 168 hours. BNP (last 3 results) No results for input(s): PROBNP in the last 8760 hours. HbA1C: Recent Labs    04/18/19 0756  HGBA1C 6.1*   CBG: No results for input(s): GLUCAP in the last 168 hours. Lipid Profile: Recent Labs    04/18/19 0756  CHOL 147  HDL 32*   LDLCALC 101*  TRIG 71  CHOLHDL 4.6   Thyroid Function Tests: No results for input(s): TSH, T4TOTAL, FREET4, T3FREE, THYROIDAB in the last 72 hours. Anemia Panel: No results for input(s): VITAMINB12, FOLATE, FERRITIN, TIBC, IRON, RETICCTPCT in the last 72 hours.    Radiology Studies: I have reviewed all of the imaging during this hospital visit personally     Scheduled Meds: . aspirin  81 mg Oral Daily  . carvedilol  6.25 mg Oral BID WC  . dexamethasone (DECADRON) injection  6 mg Intravenous Q24H  . DULoxetine  20 mg Oral BID  . enoxaparin (LOVENOX) injection  40 mg Subcutaneous Q24H  . fluticasone  1 puff Inhalation BID  . isosorbide mononitrate  30 mg Oral Daily  . [START ON 04/22/2019] methotrexate  7.5 mg Oral Weekly  . simvastatin  20 mg Oral QHS   Continuous Infusions: . sodium chloride 75 mL/hr at 04/20/19 0434  . remdesivir 100  mg in NS 250 mL 100 mg (04/19/19 1126)     LOS: 4 days        Mauricio Gerome Apley, MD

## 2019-04-21 DIAGNOSIS — N179 Acute kidney failure, unspecified: Secondary | ICD-10-CM

## 2019-04-21 LAB — BASIC METABOLIC PANEL
Anion gap: 3 — ABNORMAL LOW (ref 5–15)
BUN: 21 mg/dL (ref 8–23)
CO2: 21 mmol/L — ABNORMAL LOW (ref 22–32)
Calcium: 9 mg/dL (ref 8.9–10.3)
Chloride: 115 mmol/L — ABNORMAL HIGH (ref 98–111)
Creatinine, Ser: 1.17 mg/dL — ABNORMAL HIGH (ref 0.44–1.00)
GFR calc Af Amer: 54 mL/min — ABNORMAL LOW (ref >60)
GFR calc non Af Amer: 47 mL/min — ABNORMAL LOW (ref >60)
Glucose, Bld: 136 mg/dL — ABNORMAL HIGH (ref 70–99)
Potassium: 5.3 mmol/L — ABNORMAL HIGH (ref 3.5–5.1)
Sodium: 139 mmol/L (ref 135–145)

## 2019-04-21 LAB — MAGNESIUM: Magnesium: 1.7 mg/dL (ref 1.7–2.4)

## 2019-04-21 LAB — PHOSPHORUS: Phosphorus: 2.1 mg/dL — ABNORMAL LOW (ref 2.5–4.6)

## 2019-04-21 MED ORDER — DEXAMETHASONE 4 MG PO TABS: ORAL_TABLET | ORAL | 0 refills | Status: DC

## 2019-04-21 MED ORDER — ASPIRIN 81 MG PO CHEW: 81.0000 mg | CHEWABLE_TABLET | Freq: Every day | ORAL | 0 refills | Status: DC

## 2019-04-21 NOTE — Discharge Summary (Signed)
Physician Discharge Summary  Betty Golden:[id number] DOB: [birth year]  PCP: Zachery Dauer, MD  Admitted from: Home Discharged to: Home  Admit date: 04/16/2019 Discharge date: 04/21/2019  Recommendations for Outpatient Follow-up:   Follow-up Information    Zachery Dauer, MD. Schedule an appointment as soon as possible for a visit in 1 week(s).   Specialty: Family Medicine Why: To be seen with repeat labs (CBC & BMP). Contact information: [address], [place] [address] [phone]        Cammy Copa, MD. Schedule an appointment as soon as possible for a visit in 1 week(s).   Specialty: Orthopedic Surgery Why: Follow-up regarding foot fracture. Contact information: [address] [place] [address] [phone]          Consider repeating chest x-ray in 4 weeks to ensure resolution of abnormal findings.  Home Health: PT and OT.  PT to also do vestibular evaluation. Equipment/Devices: 3 in 1 bedside commode, wheelchair, wheelchair cushion as per OT measurements.  Discharge Condition: Improved and stable CODE STATUS: Full Diet recommendation: Heart healthy diet.  Discharge Diagnoses:  Principal Problem:   COVID-19 virus infection Active Problems:   Essential hypertension   COPD (chronic obstructive pulmonary disease) (HCC)   TIA (transient ischemic attack)   Viral pneumonia   Brief Summary: 73 year old female, lives with her daughter and daughter's family, PMH of HTN, HLD, GERD, IBS, C. difficile, fibromyalgia, rheumatoid arthritis, asthma/COPD who was recently hospitalized at [facility] 9/5-9/8 for COVID pneumonia.  At that time, patient was treated with low-dose steroids and did not require Remdesevir.  Patient reportedly has been feeling dizzy and generally weak at home especially when up and ambulating.  She sustained a fall on 9/9 and developed some headache.  At that time patient was evaluated and diagnosed with acute  mildly displaced fractures of her right second third and fourth metatarsal necks.  Patient was discharged from ED to follow-up with outpatient orthopedics.  She continued to be weak at home.  On 9/18, patient had slurred speech, right-sided facial droop and some cough.  In the ED she was febrile with temperature of 101.5 F and COVID-19 test returned positive again.  Telemetry neurology was consulted, patient underwent CT head which showed no acute stroke.  Also CTA head and neck was performed which showed no large vessel occlusion.  She was admitted for further evaluation and management.  Assessment and plan:  TIA -Presented with slurring of speech, right facial droop. -CT scan of head and CT angios head and neck as above. -MRI brain showed no acute abnormality but mild chronic small vessel ischemia improving chronic left internal capsule lacunar infarct. -Echocardiogram showed EF of 55%, LV diastolic filling impairment, no cardiac source embolism. -PT/OT eval obtained.  Home health with 24-hour supervision recommended. -At home, patient was on statin.  Has been started on aspirin 81 mg daily now. -Seen by PT earlier on admission.  She declined SNF. -Also due to ongoing mild intermittent dizziness, checked orthostatics which were negative.  As per OT report, does not seem to be vestibular in origin. -No recurrence of strokelike symptoms.  Home health therapies and DME ordered.  COVID 19 pneumonia -Presented with fever. Temperature 101.5 at presentation. -patient wasrecently hospitalized(9/5-9/8)with COVID infectionand treated with low-dose steroids. -On 04/04/2019, and was discharged on 04/06/2019 after she was treated with low-dose steroids. COVID-19 test positive again. -Chest x-ray showed interim development of moderate right greater than left peripheral and basilar airspace disease  felt consistent with atypical pneumonia. -On admission, patient was started on Remdesivir and oral  steroids.   -Currently oxygen saturation 97% on room air.  Lactic acid level improved. -Patient has completed course of Remdesvir.  Will quickly taper oral Decadron as outpatient.  Hypoxia has resolved. -Consider repeating chest x-ray in 4 weeks to ensure resolution of abnormal findings.  Essential hypertension -Home meds include amlodipine, Coreg, hydralazine, Imdur, losartan, HCTZ -MRI negative for stroke. No need of permissive hypertension. -At discharge, continue carvedilol and Imdur.  Resumed amlodipine.  Discontinued hydralazine, losartan and HCTZ. -Blood pressures mildly uncontrolled and hence medication adjustment as above were made at discharge.  Close outpatient follow-up for further management.  AKI -Presented with a creatinine of 1.52, baseline normal -Renal ultrasound without hydronephrosis. -Resolved after gentle IV fluid hydration, holding losartan and HCTZ.  Creatinine down to 1.17 today.  Follow BMP closely as outpatient.  Discontinued losartan and HCTZ.  Mild hyperkalemia Likely due to overcorrection of hypokalemia.  Potassium was 3.1 yesterday and up to 5.3 today. Follow BMP in a week's time as outpatient.  Acute urinary retention -9/20, patient was noted to have urinary retention.  Foley catheter was inserted. 1800 mL of clear urine drained. -Foley catheter is removed and patient voiding without difficulty.  Fibromyalgia -on gabapentin, duloxetine at home.  Resumed at discharge  Rheumatoid arthritis -patient is on Humira as outpatient. Continue methotrexate every 7 days. Next dose due is on Thursday -Stable without acute flare.  Hypokalemia -Replaced and mildly hyperkalemic today.  Should self-correct.  Right foot, acute mildly displaced fractures of second third and fourth metatarsal necks -Patient had a fall at home on 9/9. -X-ray showed fractures as above.  Cast was applied by ED physician. Patient was discharged from the ED to follow-up with  orthopedics as outpatient.  Patient has no follow-up in last 10 days.   -Repeat x-rays show no change.  Prior hospitalist discussed with orthopedics PA Earney HamburgMichael Jeffrey.  Recommend nonweightbearing on that foot.  Patient is to follow-up with orthopedics as an outpatient in 2 weeks and ultimately may require surgery.  Patient to follow-up with Dr. August Saucerean.  NSVT Single episode of mild NSVT noted on monitor.  EF okay.  Potassium and magnesium unremarkable as below.  Continue carvedilol.  Consultations:  TeleNeurology  Procedures: TTE 04/17/2019:  IMPRESSIONS    1. Limited views.  2. Left ventricular ejection fraction, by visual estimation, is 55%. Not all myocardial segments well visualized. The left ventricle has normal function. Decreased left ventricular size. There is borderline left ventricular hypertrophy.  3. Left ventricular diastolic Doppler parameters are consistent with impaired relaxation pattern of LV diastolic filling.  4. Global right ventricle has normal systolic function.The right ventricular size is normal. No increase in right ventricular wall thickness.  5. Left atrial size was normal.  6. Right atrial size was normal.  7. The mitral valve is grossly normal. Trace mitral valve regurgitation.  8. The tricuspid valve is grossly normal. Tricuspid valve regurgitation is trivial.  9. The inferior vena cava is normal in size with greater than 50% respiratory variability, suggesting right atrial pressure of 3 mmHg. 10. The interatrial septum was not well visualized. 11. The aortic valve was not well visualized Aortic valve regurgitation was not visualized by color flow Doppler. 12. The pulmonic valve was not well visualized. Pulmonic valve regurgitation was not assessed by color flow Doppler.  Discharge Instructions  Discharge Instructions    AMB referral to orthopedics   Complete by: As  directed    Has right foot fracture on cast since 9/9   Call MD for:   Complete by: As  directed    Recurrent strokelike symptoms.   Call MD for:  difficulty breathing, headache or visual disturbances   Complete by: As directed    Call MD for:  extreme fatigue   Complete by: As directed    Call MD for:  persistant dizziness or light-headedness   Complete by: As directed    Call MD for:  persistant nausea and vomiting   Complete by: As directed    Call MD for:  severe uncontrolled pain   Complete by: As directed    Call MD for:  temperature >100.4   Complete by: As directed    Diet - low sodium heart healthy   Complete by: As directed    Increase activity slowly   Complete by: As directed    Nonweightbearing on right lower extremity.       Medication List    STOP taking these medications   hydrALAZINE 100 MG tablet Commonly known as: APRESOLINE   losartan-hydrochlorothiazide 100-12.5 MG tablet Commonly known as: HYZAAR     TAKE these medications   albuterol 108 (90 Base) MCG/ACT inhaler Commonly known as: ProAir HFA Inhale 2 puffs into the lungs every 4 (four) hours as needed for wheezing or shortness of breath.   amLODipine 10 MG tablet Commonly known as: NORVASC Take 1 tablet (10 mg total) by mouth daily.   aspirin 81 MG chewable tablet Chew 1 tablet (81 mg total) by mouth daily. Start taking on: April 22, 2019   Benefiber Drink Mix Pack Take 4 g by mouth at bedtime.   carvedilol 6.25 MG tablet Commonly known as: COREG Take 1 tablet (6.25 mg total) by mouth 2 (two) times daily with a meal.   dexamethasone 4 MG tablet Commonly known as: DECADRON Take 1 tab daily for 2 days, then half tab daily for 2 days, then stop. Start taking on: April 22, 2019   DULoxetine 20 MG capsule Commonly known as: CYMBALTA Take 20 mg by mouth 2 (two) times daily.   Fluticasone Furoate 100 MCG/ACT Aepb Commonly known as: Arnuity Ellipta Inhale 1 Dose into the lungs daily.   folic acid 1 MG tablet Commonly known as: FOLVITE Take 1 mg by mouth daily.    gabapentin 300 MG capsule Commonly known as: NEURONTIN Take 900-1,200 mg by mouth See admin instructions. Taking 3 capsules  ( ) in the morning and ( ) 4 capsules in the evening.   Humira 40 MG/0.4ML Pskt Generic drug: Adalimumab Inject 40 mg into the skin every 14 (fourteen) days.   hydrocortisone 2.5 % rectal cream Commonly known as: ANUSOL-HC Place rectally 2 (two) times daily.   isosorbide mononitrate 30 MG 24 hr tablet Commonly known as: IMDUR Take 1 tablet (30 mg total) by mouth daily.   methotrexate 2.5 MG tablet Commonly known as: RHEUMATREX Take 7.5 mg by mouth once a week. On Thursday   multivitamin capsule Take 1 capsule by mouth daily.   ondansetron 4 MG tablet Commonly known as: ZOFRAN Take 1 tablet (4 mg total) by mouth every 8 (eight) hours as needed for nausea or vomiting.   pantoprazole 40 MG tablet Commonly known as: PROTONIX Take 1 tablet (40 mg total) by mouth daily.   simvastatin 20 MG tablet Commonly known as: ZOCOR Take 20 mg by mouth at bedtime.   traMADol 50 MG tablet Commonly known as: ULTRAM Take 1  tablet (50 mg total) by mouth every 6 (six) hours as needed.      Allergies  Allergen Reactions  . Sulfa Antibiotics Rash      Procedures/Studies: Ct Code Stroke Cta Head W/wo Contrast  Result Date: 04/16/2019 CLINICAL DATA:  Right facial droop, headache, and slurred speech. Recently diagnosed with COVID-19. EXAM: CT ANGIOGRAPHY HEAD AND NECK CT PERFUSION BRAIN TECHNIQUE: Multidetector CT imaging of the head and neck was performed using the standard protocol during bolus administration of intravenous contrast. Multiplanar CT image reconstructions and MIPs were obtained to evaluate the vascular anatomy. Carotid stenosis measurements (when applicable) are obtained utilizing NASCET criteria, using the distal internal carotid diameter as the denominator. Multiphase CT imaging of the brain was performed following IV bolus contrast  injection. Subsequent parametric perfusion maps were calculated using RAPID software. CONTRAST:  OMNIPAQUE IOHEXOL 350 MG/ML SOLN COMPARISON:  Chest CTA 04/07/2019 FINDINGS: CTA NECK FINDINGS Aortic arch: Standard 3 vessel aortic arch with mild atherosclerotic plaque. Widely patent arch vessel origins. Right carotid system: Patent with minimal plaque at the carotid bifurcation. No evidence of dissection or stenosis. Left carotid system: Patent without evidence of dissection or stenosis. Vertebral arteries: Patent and codominant without evidence of dissection or stenosis. Skeleton: Bulky flowing anterior vertebral osteophytes throughout the cervical and upper thoracic spine compatible with diffuse idiopathic skeletal hyperostosis. Other neck: No evidence of cervical lymphadenopathy or mass. Upper chest: Patchy and confluent peripheral ground-glass opacities in the right greater than left upper lobes, progressed from the prior chest CT. Review of the MIP images confirms the above findings CTA HEAD FINDINGS Anterior circulation: The internal carotid arteries are patent from skull base to carotid termini with minimal nonstenotic plaque bilaterally. ACAs and MCAs are patent without evidence of proximal branch occlusion or significant proximal stenosis. The proximal left ACA is tortuous. No aneurysm is identified. Posterior circulation: The intracranial vertebral arteries are widely patent to the basilar. Patent PICAs, AICAs, and SCAs are seen bilaterally. The basilar artery is widely patent. There is a patent right posterior communicating artery. Both PCAs are patent without evidence of flow limiting proximal stenosis. There are mild right P1 and P2 stenoses. No aneurysm is identified. Venous sinuses: Patent. Anatomic variants: None. Review of the MIP images confirms the above findings CT Brain Perfusion Findings: ASPECTS: 10 CBF (<30%) Volume: 64mL Perfusion (Tmax>6.0s) volume: 77mL Mismatch Volume: 85mL Infarction  Location:None IMPRESSION: 1. Mild atherosclerosis in the head and neck without large vessel occlusion or flow limiting proximal stenosis. 2. Unremarkable CTP. 3. Partially visualized progressive pulmonary ground-glass opacities consistent with known COVID-19 pneumonia. 4.  Aortic Atherosclerosis (ICD10-I70.0). These results were called by telephone at the time of interpretation on 04/16/2019 at 7:45 pm to Dr. Clarene Duke, who verbally acknowledged these results. Electronically Signed   By: Sebastian Ache M.D.   On: 04/16/2019 19:59   Ct Code Stroke Cta Neck W/wo Contrast  Result Date: 04/16/2019 CLINICAL DATA:  Right facial droop, headache, and slurred speech. Recently diagnosed with COVID-19. EXAM: CT ANGIOGRAPHY HEAD AND NECK CT PERFUSION BRAIN TECHNIQUE: Multidetector CT imaging of the head and neck was performed using the standard protocol during bolus administration of intravenous contrast. Multiplanar CT image reconstructions and MIPs were obtained to evaluate the vascular anatomy. Carotid stenosis measurements (when applicable) are obtained utilizing NASCET criteria, using the distal internal carotid diameter as the denominator. Multiphase CT imaging of the brain was performed following IV bolus contrast injection. Subsequent parametric perfusion maps were calculated using RAPID software. CONTRAST:  OMNIPAQUE IOHEXOL 350 MG/ML SOLN COMPARISON:  Chest CTA 04/07/2019 FINDINGS: CTA NECK FINDINGS Aortic arch: Standard 3 vessel aortic arch with mild atherosclerotic plaque. Widely patent arch vessel origins. Right carotid system: Patent with minimal plaque at the carotid bifurcation. No evidence of dissection or stenosis. Left carotid system: Patent without evidence of dissection or stenosis. Vertebral arteries: Patent and codominant without evidence of dissection or stenosis. Skeleton: Bulky flowing anterior vertebral osteophytes throughout the cervical and upper thoracic spine compatible with diffuse  idiopathic skeletal hyperostosis. Other neck: No evidence of cervical lymphadenopathy or mass. Upper chest: Patchy and confluent peripheral ground-glass opacities in the right greater than left upper lobes, progressed from the prior chest CT. Review of the MIP images confirms the above findings CTA HEAD FINDINGS Anterior circulation: The internal carotid arteries are patent from skull base to carotid termini with minimal nonstenotic plaque bilaterally. ACAs and MCAs are patent without evidence of proximal branch occlusion or significant proximal stenosis. The proximal left ACA is tortuous. No aneurysm is identified. Posterior circulation: The intracranial vertebral arteries are widely patent to the basilar. Patent PICAs, AICAs, and SCAs are seen bilaterally. The basilar artery is widely patent. There is a patent right posterior communicating artery. Both PCAs are patent without evidence of flow limiting proximal stenosis. There are mild right P1 and P2 stenoses. No aneurysm is identified. Venous sinuses: Patent. Anatomic variants: None. Review of the MIP images confirms the above findings CT Brain Perfusion Findings: ASPECTS: 10 CBF (<30%) Volume: 52mL Perfusion (Tmax>6.0s) volume: 83mL Mismatch Volume: 54mL Infarction Location:None IMPRESSION: 1. Mild atherosclerosis in the head and neck without large vessel occlusion or flow limiting proximal stenosis. 2. Unremarkable CTP. 3. Partially visualized progressive pulmonary ground-glass opacities consistent with known COVID-19 pneumonia. 4.  Aortic Atherosclerosis (ICD10-I70.0). These results were called by telephone at the time of interpretation on 04/16/2019 at 7:45 pm to Dr. Clarene Duke, who verbally acknowledged these results. Electronically Signed   By: Sebastian Ache M.D.   On: 04/16/2019 19:59   Mr Brain Wo Contrast  Result Date: 04/17/2019 CLINICAL DATA:  Right facial droop, headache, slurred speech, and dizziness. COVID-19 pneumonia. EXAM: MRI HEAD WITHOUT CONTRAST  TECHNIQUE: Multiplanar, multiecho pulse sequences of the brain and surrounding structures were obtained without intravenous contrast. COMPARISON:  Head CT, CTA, and CTP 04/16/2019. Head MRI 09/18/2017. FINDINGS: Multiple sequences are mildly to moderately motion degraded. Brain: There is no evidence of acute infarct, intracranial hemorrhage, mass, midline shift, or extra-axial fluid collection. The ventricles and sulci are within normal limits for age. A chronic lacunar infarct in the anterior limb of the left internal capsule is unchanged from the prior MRI. Dilated perivascular spaces are present in the basal ganglia bilaterally. Minimal scattered T2 hyperintensities in the cerebral white matter bilaterally are similar to the prior MRI and nonspecific but compatible with chronic small vessel ischemic disease. Vascular: Major intracranial vascular flow voids are preserved. Skull and upper cervical spine: Unremarkable bone marrow signal. Sinuses/Orbits: Unremarkable orbits. Prior endoscopic sinus surgery. Small right mastoid effusion. Other: None. IMPRESSION: 1. No acute intracranial abnormality. 2. Mild chronic small-vessel ischemia including a chronic left internal capsule lacunar infarct. Electronically Signed   By: Sebastian Ache M.D.   On: 04/17/2019 12:43   US Renal  Result Date: 04/19/2019 CLINICAL DATA:  Initial evaluation for acute renal injury. EXAM: RENAL / URINARY TRACT ULTRASOUND COMPLETE COMPARISON:  None. FINDINGS: Right Kidney: Renal measurements: 9.3 x 4.2 x 4.1 cm = volume: 83.2 mL . Echogenicity within normal limits.  No nephrolithiasis or hydronephrosis. Approximate 1 cm simple cyst present within the interpolar right kidney. Left Kidney: Renal measurements: 10.0 x 4.5 x 4.8 cm = volume: 115.2 mL. Echogenicity within normal limits. No nephrolithiasis or hydronephrosis. No discrete mass lesion. Bladder: Prominent distension of the visualized urinary bladder. IMPRESSION: 1. Normal sonographic  evaluation of the kidneys. No hydronephrosis or other acute finding. 2. Prominent distension of the urinary bladder. 3. 1 cm simple cyst within the interpolar right kidney. Electronically Signed   By: Jeannine Boga M.D.   On: 04/19/2019 01:18   Ct Code Stroke Cta Cerebral Perfusion W/wo Contrast  Result Date: 04/16/2019 CLINICAL DATA:  Right facial droop, headache, and slurred speech. Recently diagnosed with COVID-19. EXAM: CT ANGIOGRAPHY HEAD AND NECK CT PERFUSION BRAIN TECHNIQUE: Multidetector CT imaging of the head and neck was performed using the standard protocol during bolus administration of intravenous contrast. Multiplanar CT image reconstructions and MIPs were obtained to evaluate the vascular anatomy. Carotid stenosis measurements (when applicable) are obtained utilizing NASCET criteria, using the distal internal carotid diameter as the denominator. Multiphase CT imaging of the brain was performed following IV bolus contrast injection. Subsequent parametric perfusion maps were calculated using RAPID software. CONTRAST:  17mL OMNIPAQUE IOHEXOL 350 MG/ML SOLN COMPARISON:  Chest CTA 04/07/2019 FINDINGS: CTA NECK FINDINGS Aortic arch: Standard 3 vessel aortic arch with mild atherosclerotic plaque. Widely patent arch vessel origins. Right carotid system: Patent with minimal plaque at the carotid bifurcation. No evidence of dissection or stenosis. Left carotid system: Patent without evidence of dissection or stenosis. Vertebral arteries: Patent and codominant without evidence of dissection or stenosis. Skeleton: Bulky flowing anterior vertebral osteophytes throughout the cervical and upper thoracic spine compatible with diffuse idiopathic skeletal hyperostosis. Other neck: No evidence of cervical lymphadenopathy or mass. Upper chest: Patchy and confluent peripheral ground-glass opacities in the right greater than left upper lobes, progressed from the prior chest CT. Review of the MIP images confirms  the above findings CTA HEAD FINDINGS Anterior circulation: The internal carotid arteries are patent from skull base to carotid termini with minimal nonstenotic plaque bilaterally. ACAs and MCAs are patent without evidence of proximal branch occlusion or significant proximal stenosis. The proximal left ACA is tortuous. No aneurysm is identified. Posterior circulation: The intracranial vertebral arteries are widely patent to the basilar. Patent PICAs, AICAs, and SCAs are seen bilaterally. The basilar artery is widely patent. There is a patent right posterior communicating artery. Both PCAs are patent without evidence of flow limiting proximal stenosis. There are mild right P1 and P2 stenoses. No aneurysm is identified. Venous sinuses: Patent. Anatomic variants: None. Review of the MIP images confirms the above findings CT Brain Perfusion Findings: ASPECTS: 10 CBF (<30%) Volume: 48mL Perfusion (Tmax>6.0s) volume: 41mL Mismatch Volume: 3mL Infarction Location:None IMPRESSION: 1. Mild atherosclerosis in the head and neck without large vessel occlusion or flow limiting proximal stenosis. 2. Unremarkable CTP. 3. Partially visualized progressive pulmonary ground-glass opacities consistent with known COVID-19 pneumonia. 4.  Aortic Atherosclerosis (ICD10-I70.0). These results were called by telephone at the time of interpretation on 04/16/2019 at 7:45 pm to Dr. Thurnell Garbe, who verbally acknowledged these results. Electronically Signed   By: Logan Bores M.D.   On: 04/16/2019 19:59   Dg Chest Port 1 View  Result Date: 04/17/2019 CLINICAL DATA:  COVID-19 positive EXAM: PORTABLE CHEST 1 VIEW COMPARISON:  04/03/2019, 04/05/2019 FINDINGS: Interim development of moderate right greater than left peripheral and basilar airspace disease. No pleural effusion. Normal heart size. No pneumothorax IMPRESSION:  Interim development of moderate right greater than left peripheral and basilar airspace disease felt consistent with atypical  pneumonia and given clinical history. Electronically Signed   By: Jasmine PangKim  Fujinaga M.D.   On: 04/17/2019 00:37    Dg Foot Complete Right  Result Date: 04/19/2019 CLINICAL DATA:  Fracture EXAM: RIGHT FOOT COMPLETE - 3+ VIEW COMPARISON:  04/07/2019 FINDINGS: No change in appearance or alignment of angulated and displaced fractures of the distal right second, third, and fourth metatarsal necks. No other fracture or dislocation. Cast material applied about the foot and ankle. Mild first metatarsophalangeal arthrosis. Soft tissues are unremarkable. IMPRESSION: No change in appearance or alignment of angulated and displaced fractures of the distal right second, third, and fourth metatarsal necks. No other fracture or dislocation. Cast material applied about the foot and ankle. Electronically Signed   By: Lauralyn PrimesAlex  Bibbey M.D.   On: 04/19/2019 11:05   Ct Head Code Stroke Wo Contrast  Result Date: 04/16/2019 CLINICAL DATA:  Code stroke. Right facial droop, headache, and slurred speech. Headaches since a fall on 04/07/2019. EXAM: CT HEAD WITHOUT CONTRAST TECHNIQUE: Contiguous axial images were obtained from the base of the skull through the vertex without intravenous contrast. COMPARISON:  Head MRI 09/18/2017 FINDINGS: Brain: There is no evidence of acute infarct, intracranial hemorrhage, mass, midline shift, or extra-axial fluid collection. The ventricles and sulci are normal. 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 Deep white matter hypodensities elsewhere bilaterally are nonspecific but compatible with mild chronic small vessel ischemic disease. Vascular: Calcified atherosclerosis at the skull base. No hyperdense vessel. Skull: No fracture or focal osseous lesion. Sinuses/Orbits: Prior endoscopic sinus surgery. No evidence of acute sinus disease. Clear mastoid air cells. Unremarkable orbits. Other: None. ASPECTS [facility]([place] Stroke Program Early CT Score) - Ganglionic level  infarction (caudate, lentiform nuclei, internal capsule, insula, M1-M3 cortex): 7 - Supraganglionic infarction (M4-M6 cortex): 3 Total score (0-10 with 10 being normal): 10 IMPRESSION: 1. No evidence of acute intracranial abnormality. 2. ASPECTS is 10. 3. Mild chronic small vessel ischemic disease. These results were called by telephone at the time of interpretation on 04/16/2019 at 6:15 pm to provider ALEXANDRA LAW , who verbally acknowledged these results. Electronically Signed   By: Sebastian AcheAllen  Grady M.D.   On: 04/16/2019 18:16      Subjective: Patient anxious to go home.  Reports mild intermittent dizziness in upright position and?  Head spinning at times.  No chest pain or palpitations.  No dyspnea or cough.  Discharge Exam:  Vitals:   04/21/19 1300 04/21/19 1301 04/21/19 1303 04/21/19 1642  BP:    (!) 173/91  Pulse:    81  Resp: 17 19 18 20   Temp:    97.7 F (36.5 C)  TempSrc:    Oral  SpO2:    99%  Weight:      Height:        General: Pleasant elderly female, moderately built and nourished lying comfortably propped up in bed without distress. Cardiovascular: S1 & S2 heard, RRR, S1/S2 +. No murmurs, rubs, gallops or clicks. No JVD or pedal edema.  Telemetry personally reviewed: Sinus rhythm.  6 beat NSVT noted at 8:14 PM yesterday. Respiratory: Clear to auscultation without wheezing, rhonchi or crackles. No increased work of breathing. Abdominal:  Non distended, non tender & soft. No organomegaly or masses appreciated. Normal bowel sounds heard. CNS: Alert and oriented. No focal deficits. Extremities: no edema, no cyanosis.  Right  lower leg and foot in splint.    The results of significant diagnostics from this hospitalization (including imaging, microbiology, ancillary and laboratory) are listed below for reference.     Microbiology: Recent Results (from the past 240 hour(s))  SARS Coronavirus 2 [facility] order, Performed in [facility] hospital lab) Nasopharyngeal  Nasopharyngeal Swab     Status: Abnormal   Collection Time: 04/16/19  8:00 PM   Specimen: Nasopharyngeal Swab  Result Value Ref Range Status   SARS Coronavirus 2 POSITIVE (A) NEGATIVE Final    Comment: RESULT CALLED TO, READ BACK BY AND VERIFIED WITH: NORMAN,B @ 2138 ON 04/16/19 BY JUW Performed at [facility], [address] [place], [place] [address]      Labs: CBC: Recent Labs  Lab 04/16/19 1758 04/18/19 0756  WBC 10.6* 7.8  NEUTROABS 7.1 5.9  HGB 12.2 11.3*  HCT 38.0 33.0*  MCV 87.4 84.6  PLT 290 298   Basic Metabolic Panel: Recent Labs  Lab 04/16/19 1758 04/18/19 0756 04/18/19 1435 04/19/19 0545 04/21/19 0816  NA 133* 139 138 140 139  K 3.1* 3.0* 2.9* 3.1* 5.3*  CL 94* 105 103 108 115*  CO2 20* 21*  GLUCOSE 114* 163* 175* 128* 136*  BUN 33* 41* 44* 37* 21  CREATININE 1.52* 1.50* 1.72* 1.25* 1.17*  CALCIUM 8.8* 9.3 9.2 8.6* 9.0  MG  --   --   --   --  1.7  PHOS  --   --   --   --  2.1*   Liver Function Tests: Recent Labs  Lab 04/16/19 1758 04/19/19 0545  AST 66* 41  ALT 39 40  ALKPHOS 41 38  BILITOT 0.5 0.2*  PROT 7.7 6.2*  ALBUMIN 3.1* 2.3*   Urinalysis    Component Value Date/Time   COLORURINE YELLOW 04/18/2019 1814   APPEARANCEUR HAZY (A) 04/18/2019 1814   LABSPEC 1.038 (H) 04/18/2019 1814   PHURINE 5.0 04/18/2019 1814   GLUCOSEU NEGATIVE 04/18/2019 1814   HGBUR NEGATIVE 04/18/2019 1814   BILIRUBINUR NEGATIVE 04/18/2019 1814   KETONESUR NEGATIVE 04/18/2019 1814   PROTEINUR 30 (A) 04/18/2019 1814   UROBILINOGEN 0.2 03/09/2015 1035   NITRITE NEGATIVE 04/18/2019 1814   LEUKOCYTESUR SMALL (A) 04/18/2019 1814    I discussed in detail with patient's daughter, updated care and answered questions.  Time coordinating discharge: 40 minutes  SIGNED:  Marcellus Scott, MD, FACP, [facility]. [facility]  To contact the attending provider between 7A-7P or the covering provider during after hours 7P-7A, please log into the web site  [email or web address] and access using universal [place] password for that web site. If you do not have the password, please call the hospital operator.

## 2019-04-21 NOTE — Progress Notes (Addendum)
Occupational Therapy Treatment Patient Details Name: Betty Golden MRN: [id number] DOB: May 31, 1946 Today's Date: 04/21/2019    History of present illness Pt is a 73 y.o. female with recent admission to [facility] (9/5-04/06/19) with COVID, then with fall 04/07/19 sustaining 2-4 metatarsal fxs with d/c from ED, now admitted 04/16/19 with slurred speech and R-side facial droop. Pt tested postive COVID-19 again. MRI showed no acute abnormality; mild chronic small vessel ischemia improving chronic left internal capsule lacunar infarct. Worked up for TIA. Other PMH includes HTN, HLD, IBS, C.diff, fibromyalgia, RA, asthma, COPD.   OT comments  Pt has improved and appears to be at her functional baseline for transfers with minguardA and supervisionA with ADL in sitting position. Assuming RLE is NWB due to metatarsal fxs. Pt limited to hopping a few steps with RW and supervisionA for all transfers and supervisionA for bed mobility. Pt given education regarding energy conservation and pt intervening during conversation approppriately. Pt reports that her family is very supportive. Pt given education regarding energy conservation and pt intervening during conversation approppriately. Pt reports that her family is very supportive. No need for PT as pt reported no dizziness (RN reported no orthostatics with vitals taken recently) and pt at her functional baseline for transfers. Pt given theraband for BUE HEP. OT will continue to follow acutely.    Follow Up Recommendations  Home health OT;Supervision/Assistance - 24 hour    Equipment Recommendations  3 in 1 bedside commode;Wheelchair (measurements OT);Wheelchair cushion (measurements OT)    Recommendations for Other Services      Precautions / Restrictions Precautions Precautions: Fall Restrictions Weight Bearing Restrictions: Yes RLE Weight Bearing: Non weight bearing Other Position/Activity Restrictions: Assume R foot NWB due to recent 2nd-4th metatarsal fxs  04/07/19       Mobility Bed Mobility Overal bed mobility: Needs Assistance Bed Mobility: Supine to Sit;Sit to Supine     Supine to sit: Supervision Sit to supine: Supervision   General bed mobility comments: no assist required.  Transfers Overall transfer level: Needs assistance Equipment used: Rolling walker (2 wheeled) Transfers: Sit to/from Stand Sit to Stand: Supervision         General transfer comment: Pt fatigues easily, but transfers well from bed to [facility] with minguardA. No physical assist required. Pt continues to take a few hops from surface to surface.    Balance Overall balance assessment: Needs assistance Sitting-balance support: Single extremity supported Sitting balance-Leahy Scale: Good       Standing balance-Leahy Scale: Fair Standing balance comment: Reliant on UE support and external assist while attempting to maintain RLE NWB                           ADL either performed or assessed with clinical judgement   ADL Overall ADL's : Needs assistance/impaired Eating/Feeding: Modified independent;Sitting   Grooming: Set up;Sitting   Upper Body Bathing: Set up;Sitting   Lower Body Bathing: Min guard;Sitting/lateral leans;Sit to/from stand;Cueing for compensatory techniques   Upper Body Dressing : Set up;Sitting   Lower Body Dressing: Min guard;Sitting/lateral leans;Sit to/from stand   Toilet Transfer: Minimal assistance;Stand-pivot   Toileting- Clothing Manipulation and Hygiene: Minimal assistance;Cueing for safety;Cueing for sequencing;Sit to/from stand;Sitting/lateral lean       Functional mobility during ADLs: Min guard;Rolling walker;Cueing for safety;Cueing for sequencing General ADL Comments: Pt performing UB ADL with set-upA at EOB and able to perform own toilet hygiene in sitting.     Vision   Vision  Assessment?: No apparent visual deficits   Perception     Praxis      Cognition Arousal/Alertness:  Awake/alert Behavior During Therapy: WFL for tasks assessed/performed Overall Cognitive Status: Impaired/Different from baseline Area of Impairment: Safety/judgement;Problem solving                         Safety/Judgement: Decreased awareness of safety   Problem Solving: Slow processing General Comments: When asked orientation questions, pt aware of late September and required cues for day of the week. Pt stating "Mhm" a lot for questions and unsure if pt heard OT,but was able to follow commands with short delay.        Exercises Other Exercises Other Exercises: AROM/strengthening at EOB with theraband   Shoulder Instructions       General Comments Pt given education regarding energy conservation and pt intervening during conversation approppriately. Pt reports that her family is very supportive.    Pertinent Vitals/ Pain       Pain Assessment: 0-10 Pain Score: 3  Pain Location: R foot Pain Descriptors / Indicators: Discomfort Pain Intervention(s): Limited activity within patient's tolerance  Home Living Family/patient expects to be discharged to:: Private residence Living Arrangements: Children;Other relatives Available Help at Discharge: Family;Available 24 hours/day Type of Home: House Home Access: Stairs to enter [employer] of Steps: 2   Home Layout: One level     Bathroom Shower/Tub: Teacher, early years/pre: Handicapped height Bathroom Accessibility: No   Home Equipment: Environmental consultant - 2 wheels;Cane - single point;Wheelchair - manual          Prior Functioning/Environment              Frequency  Min 2X/week        Progress Toward Goals  OT Goals(current goals can now be found in the care plan section)  Progress towards OT goals: Progressing toward goals  Acute Rehab OT Goals Patient Stated Goal: Return home with continued assist from family OT Goal Formulation: With patient Time For Goal Achievement:  05/02/19 Potential to Achieve Goals: Good ADL Goals Pt Will Perform Lower Body Bathing: with min guard assist;with adaptive equipment;sit to/from stand Pt Will Perform Lower Body Dressing: with min guard assist;sit to/from stand;with adaptive equipment Pt Will Transfer to Toilet: with supervision;bedside commode;stand pivot transfer Pt Will Perform Toileting - Clothing Manipulation and hygiene: with supervision;sitting/lateral leans Pt/caregiver will Perform Home Exercise Program: Increased strength;Independently;With theraband;With written HEP provided;Both right and left upper extremity Additional ADL Goal #1: Pt will independently verbalize 3 strategies to reduce risk of falls Additional ADL Goal #2: Pt will demonstrate anticipatory awareness during ADL task in moderately distracting environment  Plan Discharge plan remains appropriate    Co-evaluation                 AM-PAC OT "6 Clicks" Daily Activity     Outcome Measure   Help from another person eating meals?: None Help from another person taking care of personal grooming?: A Little Help from another person toileting, which includes using toliet, bedpan, or urinal?: A Lot Help from another person bathing (including washing, rinsing, drying)?: A Little Help from another person to put on and taking off regular upper body clothing?: A Little Help from another person to put on and taking off regular lower body clothing?: A Lot 6 Click Score: 17    End of Session Equipment Utilized During Treatment: Rolling walker;Gait belt  OT Visit Diagnosis: Unsteadiness on feet (  R26.81);Other abnormalities of gait and mobility (R26.89);Muscle weakness (generalized) (M62.81);History of falling (Z91.81);Other symptoms and signs involving cognitive function;Dizziness and giddiness (R42)   Activity Tolerance Patient tolerated treatment well   Patient Left in bed;with call bell/phone within reach   Nurse Communication Mobility status;Other  (comment)(no need for PT as pt reported no dizziness and at baseline)        Time: 1415-1500 OT Time Calculation (min): 45 min  Charges: OT General Charges $OT Visit: 1 Visit OT Treatments $Self Care/Home Management : 23-37 mins $Therapeutic Activity: 8-22 mins  Cristi Loron) Glendell Docker OTR/L Acute Rehabilitation Services Pager: [phone] Office: [phone]    Lonzo Cloud 04/21/2019, 3:41 PM

## 2019-04-21 NOTE — Discharge Instructions (Signed)

## 2019-04-22 ENCOUNTER — Telehealth: Payer: Self-pay

## 2019-04-22 NOTE — Telephone Encounter (Signed)
Patients daughter called to let us know that the patient had just got out of the hospital due to her having a stroke on her left side.  They just came home today and the patient is weak but doing okay.

## 2019-04-22 NOTE — Progress Notes (Signed)
04/22/19, 12:48 PM:  CSW contacted by daughter, Elmyra Ricks, after discharge that patient left yesterday without a wheelchair. Per chart review, no order for a wheelchair in the chart. CSW informed Elmyra Ricks that she will now have to reach out to the patient's PCP for a wheelchair, and Elmyra Ricks indicated understanding.  Laveda Abbe, [place] Clinical Social Worker [phone]

## 2019-04-22 NOTE — Telephone Encounter (Signed)
Great - thanks for the update.  Joel Gallagher, MD [facility]  

## 2019-04-22 NOTE — Telephone Encounter (Signed)
CSW spoke with daughter, Elmyra Ricks, as patient discharged from the hospital last night without a wheelchair. No order in the chart for a wheelchair. CSW informed Elmyra Ricks that she would have to call patient's PCP for an order for the wheelchair. Elmyra Ricks indicated understanding.

## 2019-04-23 ENCOUNTER — Ambulatory Visit: Payer: Medicare Other

## 2019-05-06 ENCOUNTER — Ambulatory Visit (INDEPENDENT_AMBULATORY_CARE_PROVIDER_SITE_OTHER): Payer: Medicare Other | Admitting: Nurse Practitioner

## 2019-05-14 ENCOUNTER — Other Ambulatory Visit: Payer: Self-pay

## 2019-05-14 ENCOUNTER — Ambulatory Visit: Payer: Self-pay | Admitting: Allergy & Immunology

## 2019-05-14 DIAGNOSIS — Z20822 Contact with and (suspected) exposure to covid-19: Secondary | ICD-10-CM

## 2019-05-15 LAB — NOVEL CORONAVIRUS, NAA: SARS-CoV-2, NAA: NOT DETECTED

## 2019-05-17 ENCOUNTER — Telehealth: Payer: Self-pay | Admitting: *Deleted

## 2019-05-17 NOTE — Telephone Encounter (Signed)
Pt given result of COVID test; she verbalized understanding. 

## 2019-05-21 ENCOUNTER — Ambulatory Visit: Payer: Self-pay | Admitting: Allergy & Immunology

## 2019-05-21 ENCOUNTER — Telehealth: Payer: Medicare Other | Admitting: Cardiology

## 2019-05-22 NOTE — Progress Notes (Deleted)
   Subjective:    Patient ID: Lucillie Garfinkel, female    DOB: Feb 11, 1946, 73 y.o.   MRN: [id number]  HPI Chelsae Zanella. Macrae is a 73 year old female with a past medial history of asthma, CAD, HTN, depression, rheumatoid arthritis on Humira and MTX, fibromyalgia, c.diff, GERD and IBS. She was hospitalized for COVID 19 pneumonia 9/5 - 04/06/2019 treated with low dose steroids. She did not require Remdesevir.   On 9/18, patient had slurred speech, right-sided facial droop and some cough.  In the ED she was febrile with temperature of 101.5 F and COVID-19 test returned positive again.  Telemetry neurology was consulted, patient underwent CT head which showed no acute stroke.  CTA head and neck was performed which showed no large vessel occlusion.  She was admitted for further evaluation and management.  On 04/04/2019, and was discharged on 04/06/2019 after she was treated with low-dose steroids. COVID-19 test positive again. -Chest x-ray showed interim development of moderate right greater than left peripheral and basilar airspace disease felt consistent with atypical pneumonia. -On admission, patient was started on Remdesivir and oral steroids. -Patient has completed course of Remdesvir.     EGD 08/26/2018: - Normal esophagus. - Z-line irregular, 37 cm from the incisors. - Scar in the gastric antrum indicative of healed ulcer (posterior wall). - Gastritis. Biopsied. - Normal duodenal bulb and second portion of the duodenum.  Colonoscopy 08/26/2018: - Diverticulosis in the entire examined colon. - External hemorrhoids. - No specimens collected.  Past Medical History:  Diagnosis Date  . Arthritis   . Asthma   . C. difficile colitis   . Cervical stenosis of spine   . Chronic headaches   . Chronic neck pain   . Coronary artery disease   . Depression   . Diverticulosis   . Essential hypertension   . Fibromyalgia   . GERD (gastroesophageal reflux disease)   . History of Salmonella  gastroenteritis   . HNP (herniated nucleus pulposus), lumbar   . Hyperlipidemia   . IBS (irritable bowel syndrome)   . Lung nodule    Past Surgical History:  Procedure Laterality Date  . APPENDECTOMY  1965  . BIOPSY  08/26/2018   Procedure: BIOPSY;  Surgeon: Rogene Houston, MD;  Location: [facility];  Service: Endoscopy;;  gastric  . BREAST BIOPSY    . BREAST REDUCTION SURGERY  1994  . CHOLECYSTECTOMY  1975  . COLONOSCOPY N/A 08/19/2013   Procedure: COLONOSCOPY;  Surgeon: Rogene Houston, MD;  Location: [facility];  Service: Endoscopy;  Laterality: N/A;  155-moved to 140 Ann to notify pt  . COLONOSCOPY N/A 08/26/2018   Procedure: COLONOSCOPY;  Surgeon: Rogene Houston, MD;  Location: [facility];  Service: Endoscopy;  Laterality: N/A;  . ESOPHAGOGASTRODUODENOSCOPY N/A 08/26/2018   Procedure: ESOPHAGOGASTRODUODENOSCOPY (EGD);  Surgeon: Rogene Houston, MD;  Location: [facility];  Service: Endoscopy;  Laterality: N/A;  . fibromyalgia    . LUMBAR LAMINECTOMY/DECOMPRESSION MICRODISCECTOMY Bilateral 09/20/2015   Procedure: MICRO LUMBAR DECOMPRESSION L5-S1 BILATERAL    (1 LEVEL);  Surgeon: Susa Day, MD;  Location: [facility];  Service: Orthopedics;  Laterality: Bilateral;  . rheumatoid arthritis    . Sinus Surgery    . TONSILLECTOMY  1968       Review of Systems     Objective:   Physical Exam        Assessment & Plan:

## 2019-05-24 ENCOUNTER — Ambulatory Visit (INDEPENDENT_AMBULATORY_CARE_PROVIDER_SITE_OTHER): Payer: Medicare Other | Admitting: Nurse Practitioner

## 2019-06-02 ENCOUNTER — Encounter: Payer: Self-pay | Admitting: Allergy & Immunology

## 2019-06-02 ENCOUNTER — Ambulatory Visit (INDEPENDENT_AMBULATORY_CARE_PROVIDER_SITE_OTHER): Payer: Medicare Other | Admitting: Allergy & Immunology

## 2019-06-02 ENCOUNTER — Other Ambulatory Visit: Payer: Self-pay

## 2019-06-02 VITALS — BP 140/82 | HR 90 | Temp 98.3°F | Resp 18

## 2019-06-02 DIAGNOSIS — J4540 Moderate persistent asthma, uncomplicated: Secondary | ICD-10-CM

## 2019-06-02 DIAGNOSIS — J4550 Severe persistent asthma, uncomplicated: Secondary | ICD-10-CM

## 2019-06-02 DIAGNOSIS — J310 Chronic rhinitis: Secondary | ICD-10-CM | POA: Diagnosis not present

## 2019-06-02 MED ORDER — PREDNISONE 10 MG PO TABS: ORAL_TABLET | ORAL | 0 refills | Status: DC

## 2019-06-02 NOTE — Progress Notes (Signed)
FOLLOW UP  Date of Service/Encounter:  06/02/19   Assessment:   Moderate persistent asthma, uncomplicated   Chronic allergic rhinitis ([place] in [place]  Rheumatoid arthritis - on Humira  Fibromyalgia  Plan/Recommendations:   1. Chronic nonseasonal allergic rhinitis - Continue with fluticasone two sprays per nostril once daily. - Continue with cetirizine (Zyrtec).     2. Moderate persistent asthma, uncomplicated - Lung testing deferred since our device was not working. - Resume the Arnuity one puff daily during flares. - Start the prednisone pack sent into the pharmacy.  Harrington Challenger- Fasenra restarted today.  - Daily controller medication(s): Fasenra every 8 weeks - Prior to physical activity: albuterol 2 puffs 10-15 minutes before physical activity. - Rescue medications: albuterol 4 puffs every 4-6 hours as needed - Changes during respiratory infections or worsening symptoms: Add on Arnuity 20mcg 1 puff once daily for TWO WEEKS. - Asthma control goals:  * Full participation in all desired activities (may need albuterol before activity) * Albuterol use two time or less a week on average (not counting use with activity) * Cough interfering with sleep two time or less a month * Oral steroids no more than once a year * No hospitalizations  3. Return in about 4 months (around 09/30/2019). This can be an in-person, a virtual Webex or a telephone follow up visit.   Subjective:   Betty Golden J Cuff is a 73 y.o. female presenting today for follow up of  Chief Complaint  Patient presents with  . Asthma    bronchitis. says that asthma has not been too bad, mainly her bronchitis. Started in September with not being able to breathe. she has some wheezing and shortness of breath.     Betty Golden has a history of the following: Patient Active Problem List   Diagnosis Date Noted  . Viral pneumonia 04/20/2019  . TIA (transient ischemic attack) 04/16/2019  .  Dyspnea 04/04/2019  . COVID-19 virus infection 04/04/2019  . Gastritis and gastroduodenitis 07/08/2018  . Rectal bleeding 06/29/2018  . Fibromyalgia 09/08/2017  . Spinal stenosis of lumbar region 09/20/2015  . 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 Moderate persistent asthma 07/03/2015  . Allergic rhinitis 07/03/2015  . Colitis due to Clostridium difficile 02/10/2015  . Abdominal pain 02/10/2015  . Nausea and vomiting 02/10/2015  . Acute renal failure (HCC) 02/10/2015  . Dehydration 02/10/2015  . Hypoxia 02/10/2015  . Hyponatremia 02/10/2015  . Hypokalemia 02/10/2015  . COPD (chronic obstructive pulmonary disease) (HCC)   . Unspecified constipation 07/15/2013  . DYSPNEA 04/05/2008  . Essential hypertension 03/10/2008  . Headache(784.0) 03/10/2008  . Cough 03/10/2008    History obtained from: chart review and patient.  [name] is a 73 y.o. female presenting for a follow up visit.  She was last seen in May 2020.  At that time, we continued with Flonase and Zyrtec for her allergic rhinitis.  For her asthma, her testing looked great.  We changed her to Arnuity 1 puff daily only during flares.  She was doing great with Harrington ChallengerFasenra every 8 weeks. We did do an immune work and we started her on prednisone at the last visit. Her immune workup was completely normal, thankfully.   In the interim, she was actually admitted to the hospital in September 2020 for COVID-19 as well as a stroke. She also broke three toes in the middle of this secondary to a fall. She is unsure where she got the COVID because she was really staying very  isolated. She is feeling better after all of this.   Since the last visit, she has mostly done well. She does report that she has had some breathing problems over the last couple of weeks.  She reports that her chest is heavy.  She also tells me that she had some swelling in her left shoulder which she describes as a "fibromyalgia flare".   Asthma/Respiratory Symptom History: Her  breathing has not been well controlled. She has not received her Harrington Challenger in ages due to all of these hospitalizations.  Mariyam's asthma has not been well controlled. She has required rescue medication and has been having increased symptoms at night. ACT is 15, indicating excellent asthma control.  When she was on her Harrington Challenger, she was doing quite well but she has not received it in over 3 months.  Otherwise, there have been no changes to her past medical history, surgical history, family history, or social history.    Review of Systems  Constitutional: Negative.  Negative for chills, fever, malaise/fatigue and weight loss.  HENT: Negative.  Negative for congestion, ear discharge, ear pain, nosebleeds and sore throat.   Eyes: Negative for pain, discharge and redness.  Respiratory: Positive for cough, shortness of breath and wheezing. Negative for sputum production.   Cardiovascular: Negative.  Negative for chest pain and palpitations.  Gastrointestinal: Negative for abdominal pain, constipation, diarrhea, heartburn, nausea and vomiting.  Skin: Negative.  Negative for itching and rash.  Neurological: Negative for dizziness and headaches.  Endo/Heme/Allergies: Negative for environmental allergies. Does not bruise/bleed easily.       Objective:   Blood pressure 140/82, pulse 90, temperature 98.3 F (36.8 C), temperature source Temporal, resp. rate 18, SpO2 97 %. There is no height or weight on file to calculate BMI.   Physical Exam:  Physical Exam  Constitutional: She appears well-developed.  HENT:  Head: Normocephalic and atraumatic.  Right Ear: Tympanic membrane, external ear and ear canal normal. No drainage, swelling or tenderness. Tympanic membrane is not injected, not scarred, not erythematous, not retracted and not bulging.  Left Ear: Tympanic membrane, external ear and ear canal normal. No drainage, swelling or tenderness. Tympanic membrane is not injected, not scarred, not  erythematous, not retracted and not bulging.  Nose: No mucosal edema, rhinorrhea, nasal deformity or septal deviation. No epistaxis. Right sinus exhibits no maxillary sinus tenderness and no frontal sinus tenderness. Left sinus exhibits no maxillary sinus tenderness and no frontal sinus tenderness.  Mouth/Throat: Uvula is midline and oropharynx is clear and moist. Mucous membranes are not pale and not dry.  Eyes: Pupils are equal, round, and reactive to light. Conjunctivae and EOM are normal. Right eye exhibits no chemosis and no discharge. Left eye exhibits no chemosis and no discharge. Right conjunctiva is not injected. Left conjunctiva is not injected.  Cardiovascular: Normal rate, regular rhythm and normal heart sounds.  Respiratory: Effort normal and breath sounds normal. No accessory muscle usage. No tachypnea. No respiratory distress. She has no wheezes. She has no rhonchi. She has no rales. She exhibits no tenderness.  Decreased air movement at the bases. Shallow breathing overall.   GI: There is no abdominal tenderness. There is no rebound and no guarding.  Lymphadenopathy:       Head (right side): No submandibular, no tonsillar and no occipital adenopathy present.       Head (left side): No submandibular, no tonsillar and no occipital adenopathy present.    She has no cervical adenopathy.  Neurological: She is  alert.  Skin: No abrasion, no petechiae and no rash noted. Rash is not papular, not vesicular and not urticarial. No erythema. No pallor.  No eczematous or urticarial lesions noted.   Psychiatric: She has a normal mood and affect.     Diagnostic studies: none     Salvatore Marvel, MD  [facility]

## 2019-06-02 NOTE — Patient Instructions (Addendum)
1. Chronic nonseasonal allergic rhinitis - Continue with fluticasone two sprays per nostril once daily. - Continue with cetirizine (Zyrtec).     2. Moderate persistent asthma, uncomplicated - Lung testing deferred since our device was not working. - Resume the Arnuity one puff daily during flares. - Start the prednisone pack sent into the pharmacy.  Berna Bue restarted today.  - Daily controller medication(s): Fasenra every 8 weeks - Prior to physical activity: albuterol 2 puffs 10-15 minutes before physical activity. - Rescue medications: albuterol 4 puffs every 4-6 hours as needed - Changes during respiratory infections or worsening symptoms: Add on Arnuity 11mcg 1 puff once daily for TWO WEEKS. - Asthma control goals:  * Full participation in all desired activities (may need albuterol before activity) * Albuterol use two time or less a week on average (not counting use with activity) * Cough interfering with sleep two time or less a month * Oral steroids no more than once a year * No hospitalizations  3. Return in about 4 months (around 09/30/2019). This can be an in-person, a virtual Webex or a telephone follow up visit.   Please inform us of any Emergency Department visits, hospitalizations, or changes in symptoms. Call us before going to the ED for breathing or allergy symptoms since we might be able to fit you in for a sick visit. Feel free to contact us anytime with any questions, problems, or concerns.  It was a pleasure to see you again today!  Websites that have reliable patient information: 1. American Academy of Asthma, Allergy, and Immunology: www.aaaai.org 2. Food Allergy Research and Education (FARE): foodallergy.org 3. Mothers of Asthmatics: http://www.asthmacommunitynetwork.org 4. American College of Allergy, Asthma, and Immunology: www.acaai.org  "Like" [place] on Facebook and Instagram for our latest updates!      Make sure you are registered to vote! If you have  moved or changed any of your contact information, you will need to get this updated before voting!  In some cases, you MAY be able to register to vote online: [email or web address]

## 2019-06-16 ENCOUNTER — Ambulatory Visit: Payer: Medicare Other | Admitting: Allergy & Immunology

## 2019-07-28 ENCOUNTER — Ambulatory Visit: Payer: Self-pay

## 2019-08-04 ENCOUNTER — Ambulatory Visit (INDEPENDENT_AMBULATORY_CARE_PROVIDER_SITE_OTHER): Payer: Medicare Other

## 2019-08-04 DIAGNOSIS — J4550 Severe persistent asthma, uncomplicated: Secondary | ICD-10-CM

## 2019-09-27 ENCOUNTER — Ambulatory Visit: Payer: Self-pay

## 2019-09-30 ENCOUNTER — Ambulatory Visit: Payer: Medicare Other

## 2019-10-01 ENCOUNTER — Ambulatory Visit: Payer: Medicare Other | Admitting: Allergy & Immunology

## 2019-10-06 ENCOUNTER — Ambulatory Visit: Payer: Medicare Other

## 2019-10-13 ENCOUNTER — Ambulatory Visit: Payer: Self-pay

## 2019-10-20 ENCOUNTER — Ambulatory Visit: Payer: Medicare Other | Admitting: Allergy & Immunology

## 2019-10-20 ENCOUNTER — Ambulatory Visit (INDEPENDENT_AMBULATORY_CARE_PROVIDER_SITE_OTHER): Payer: Medicare Other

## 2019-10-20 ENCOUNTER — Other Ambulatory Visit: Payer: Self-pay

## 2019-10-20 ENCOUNTER — Encounter: Payer: Self-pay | Admitting: Allergy & Immunology

## 2019-10-20 VITALS — BP 132/64 | HR 73 | Temp 97.8°F | Resp 18

## 2019-10-20 DIAGNOSIS — J310 Chronic rhinitis: Secondary | ICD-10-CM

## 2019-10-20 DIAGNOSIS — J4550 Severe persistent asthma, uncomplicated: Secondary | ICD-10-CM | POA: Diagnosis not present

## 2019-10-20 MED ORDER — ALBUTEROL SULFATE HFA 108 (90 BASE) MCG/ACT IN AERS: 2.0000 | INHALATION_SPRAY | RESPIRATORY_TRACT | 3 refills | Status: DC | PRN

## 2019-10-20 MED ORDER — FLUTICASONE PROPIONATE 50 MCG/ACT NA SUSP: 2.0000 | Freq: Every day | NASAL | 5 refills | Status: DC

## 2019-10-20 MED ORDER — ARNUITY ELLIPTA 200 MCG/ACT IN AEPB: 1.0000 | INHALATION_SPRAY | Freq: Every day | RESPIRATORY_TRACT | 5 refills | Status: DC

## 2019-10-20 NOTE — Progress Notes (Signed)
Immunotherapy   Patient Details  Name: Betty Golden MRN: [id number] Date of Birth: Nov 29, 1945  10/20/2019  Manya Silvas Boltz Restart to Harrington Challenger 30mg  every 8 weeks, but Dr. said for the first few injection to schedule her every 4 weeks  Following schedule: Fasenra Frequency: Every 8 weeks but the first few injections every 4 weeks Epi-Pen:Yes  Consent signed and patient instructions given.   Dellis Anes 10/20/2019, 3:00 PM

## 2019-10-20 NOTE — Progress Notes (Signed)
FOLLOW UP  Date of Service/Encounter:  10/20/19   Assessment:   Moderate persistent asthma, uncomplicated   Chronic allergic rhinitis (last testing performed in [place])  Rheumatoid arthritis - on Humira  Fibromyalgia  Recent history of stroke (September 2020)  Plan/Recommendations:   1. Chronic nonseasonal allergic rhinitis - Continue with fluticasone two sprays per nostril once daily. - Continue with cetirizine (Zyrtec).   - Prednisone pack provided (one tablet daily for five days).   2. Moderate persistent asthma, uncomplicated - Restart Fasenra.  - Breathing test looked awesome.  - Daily controller medication(s): Fasenra every 8 weeks - Prior to physical activity: albuterol 2 puffs 10-15 minutes before physical activity. - Rescue medications: albuterol 4 puffs every 4-6 hours as needed - Changes during respiratory infections or worsening symptoms: Add on Arnuity 1 puff once daily for TWO WEEKS. - Asthma control goals:  * Full participation in all desired activities (may need albuterol before activity) * Albuterol use two time or less a week on average (not counting use with activity) * Cough interfering with sleep two time or less a month * Oral steroids no more than once a year * No hospitalizations  3. Return in about 4 months (around 02/19/2020). This can be an in-person, a virtual Webex or a telephone follow up visit.  Subjective:   Betty Golden is a 74 y.o. female presenting today for follow up of  Chief Complaint  Patient presents with  . Follow-up  . Asthma    BRIDGID PRINTZ has a history of the following: Patient Active Problem List   Diagnosis Date Noted  . Viral pneumonia 04/20/2019  . TIA (transient ischemic attack) 04/16/2019  . Dyspnea 04/04/2019  . COVID-19 virus infection 04/04/2019  . Gastritis and gastroduodenitis 07/08/2018  . Rectal bleeding 06/29/2018  . Fibromyalgia 09/08/2017  . Spinal stenosis of lumbar  region 09/20/2015  . 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 Moderate persistent asthma 07/03/2015  . Allergic rhinitis 07/03/2015  . Colitis due to Clostridium difficile 02/10/2015  . Abdominal pain 02/10/2015  . Nausea and vomiting 02/10/2015  . Acute renal failure (HCC) 02/10/2015  . Dehydration 02/10/2015  . Hypoxia 02/10/2015  . Hyponatremia 02/10/2015  . Hypokalemia 02/10/2015  . COPD (chronic obstructive pulmonary disease) (HCC)   . Unspecified constipation 07/15/2013  . DYSPNEA 04/05/2008  . Essential hypertension 03/10/2008  . Headache(784.0) 03/10/2008  . Cough 03/10/2008    History obtained from: chart review and patient.  [name] is a 74 y.o. female presenting for a follow up visit.  She was last seen in November 2020.  At that time, we continued Flonase as well as Zyrtec for her allergic rhinitis.  We deferred her lung testing.  We recommended resuming Arnuity 1 puff once daily.  We did start her on a prednisone Dosepak.  We also restarted her Harrington Challenger.  Since the last visit, she has done well.  Asthma/Respiratory Symptom History: She feels that her breathing is doing well. She has not been using her Arnuity. She has used her rescue inhaler twice in one month. She does endorse a dry cough at least once daily. This is not a regular thing at all. She does not use her controller medication because it is too expensive for her. Therefore she spends a lot of time rationing this. ACT is 21 today, indicating excellent asthma control.   Allergic Rhinitis Symptom History: She remains on the nasal steroid two sprays per nostril daily. She is wondering whether she can  use it more than once daily. She has not needed antibiotics at all since the last visit.    Otherwise, there have been no changes to her past medical history, surgical history, family history, or social history.    Review of Systems  Constitutional: Negative.  Negative for chills, fever, malaise/fatigue and weight loss.  HENT:  Positive for congestion. Negative for ear discharge and ear pain.   Eyes: Negative for pain, discharge and redness.  Respiratory: Negative for cough, sputum production, shortness of breath and wheezing.   Cardiovascular: Negative.  Negative for chest pain and palpitations.  Gastrointestinal: Negative for abdominal pain, constipation, diarrhea, heartburn, nausea and vomiting.  Skin: Negative.  Negative for itching and rash.  Neurological: Negative for dizziness and headaches.  Endo/Heme/Allergies: Negative for environmental allergies. Does not bruise/bleed easily.       Objective:   Blood pressure 132/64, pulse 73, temperature 97.8 F (36.6 C), temperature source Temporal, resp. rate 18, SpO2 97 %. There is no height or weight on file to calculate BMI.   Physical Exam:  Physical Exam  Constitutional: She appears well-developed.  Bubbly and personable.  HENT:  Head: Normocephalic and atraumatic.  Right Ear: Tympanic membrane, external ear and ear canal normal. No drainage, swelling or tenderness. Tympanic membrane is not injected, not scarred, not erythematous, not retracted and not bulging.  Left Ear: Tympanic membrane, external ear and ear canal normal. No drainage, swelling or tenderness. Tympanic membrane is not injected, not scarred, not erythematous, not retracted and not bulging.  Nose: Rhinorrhea present. No mucosal edema, nasal deformity or septal deviation. No epistaxis. Right sinus exhibits no maxillary sinus tenderness and no frontal sinus tenderness. Left sinus exhibits no maxillary sinus tenderness and no frontal sinus tenderness.  Mouth/Throat: Uvula is midline and oropharynx is clear and moist. Mucous membranes are not pale and not dry.  No polyps.  Eyes: Pupils are equal, round, and reactive to light. Conjunctivae and EOM are normal. Right eye exhibits no chemosis and no discharge. Left eye exhibits no chemosis and no discharge. Right conjunctiva is not injected. Left  conjunctiva is not injected.  Cardiovascular: Normal rate, regular rhythm and normal heart sounds.  Respiratory: Effort normal and breath sounds normal. No accessory muscle usage. No tachypnea. No respiratory distress. She has no wheezes. She has no rhonchi. She has no rales. She exhibits no tenderness.  Moving air well in all lung fields.   GI: There is no abdominal tenderness. There is no rebound and no guarding.  Lymphadenopathy:       Head (right side): No submandibular, no tonsillar and no occipital adenopathy present.       Head (left side): No submandibular, no tonsillar and no occipital adenopathy present.    She has no cervical adenopathy.  Neurological: She is alert.  Skin: No abrasion, no petechiae and no rash noted. Rash is not papular, not vesicular and not urticarial. No erythema. No pallor.  Psychiatric: She has a normal mood and affect.     Diagnostic studies:    Spirometry: results normal (FEV1: 1.56/87%, FVC: 2.02/87%, FEV1/FVC: 77%).    Spirometry consistent with normal pattern.   Allergy Studies: none        Salvatore Marvel, MD  [facility]

## 2019-10-20 NOTE — Patient Instructions (Addendum)
1. Chronic nonseasonal allergic rhinitis - Continue with fluticasone two sprays per nostril once daily. - Continue with cetirizine (Zyrtec).   - Prednisone pack provided (one tablet daily for five days).   2. Moderate persistent asthma, uncomplicated - Restart Fasenra.  - Breathing test looked awesome.  - Daily controller medication(s): Fasenra every 8 weeks - Prior to physical activity: albuterol 2 puffs 10-15 minutes before physical activity. - Rescue medications: albuterol 4 puffs every 4-6 hours as needed - Changes during respiratory infections or worsening symptoms: Add on Arnuity 1 puff once daily for TWO WEEKS. - Asthma control goals:  * Full participation in all desired activities (may need albuterol before activity) * Albuterol use two time or less a week on average (not counting use with activity) * Cough interfering with sleep two time or less a month * Oral steroids no more than once a year * No hospitalizations  3. Return in about 4 months (around 02/19/2020). This can be an in-person, a virtual Webex or a telephone follow up visit.   Please inform us of any Emergency Department visits, hospitalizations, or changes in symptoms. Call us before going to the ED for breathing or allergy symptoms since we might be able to fit you in for a sick visit. Feel free to contact us anytime with any questions, problems, or concerns.  It was a pleasure to see you again today!  Websites that have reliable patient information: 1. American Academy of Asthma, Allergy, and Immunology: www.aaaai.org 2. Food Allergy Research and Education (FARE): foodallergy.org 3. Mothers of Asthmatics: http://www.asthmacommunitynetwork.org 4. American College of Allergy, Asthma, and Immunology: www.acaai.org  "Like" [place] on Facebook and Instagram for our latest updates!      Make sure you are registered to vote! If you have moved or changed any of your contact information, you will need to get this  updated before voting!  In some cases, you MAY be able to register to vote online: [email or web address]

## 2019-12-08 ENCOUNTER — Ambulatory Visit: Payer: Self-pay

## 2019-12-10 ENCOUNTER — Ambulatory Visit (INDEPENDENT_AMBULATORY_CARE_PROVIDER_SITE_OTHER): Payer: Medicare Other

## 2019-12-10 ENCOUNTER — Other Ambulatory Visit: Payer: Self-pay

## 2019-12-10 DIAGNOSIS — J4550 Severe persistent asthma, uncomplicated: Secondary | ICD-10-CM | POA: Diagnosis not present

## 2020-01-04 ENCOUNTER — Telehealth: Payer: Self-pay

## 2020-01-04 NOTE — Telephone Encounter (Signed)
Called and rescheduled her for 02/08/2022, she will be out of town the first two weeks of July.

## 2020-01-04 NOTE — Telephone Encounter (Signed)
-----   Message from Devoria Glassing, [place] sent at 12/20/2019  2:42 PM EDT ----- Regarding: appt change Tyrisha Benninger, Please contact patient and change appt from 6/11 to 7/9. Patient is on every 8 weeks. Thanks [employer]

## 2020-01-07 ENCOUNTER — Ambulatory Visit: Payer: Self-pay

## 2020-02-04 ENCOUNTER — Ambulatory Visit: Payer: Self-pay

## 2020-02-09 ENCOUNTER — Ambulatory Visit: Payer: Self-pay

## 2020-02-11 ENCOUNTER — Other Ambulatory Visit: Payer: Self-pay

## 2020-02-11 ENCOUNTER — Ambulatory Visit (INDEPENDENT_AMBULATORY_CARE_PROVIDER_SITE_OTHER): Payer: Medicare Other

## 2020-02-11 DIAGNOSIS — J4550 Severe persistent asthma, uncomplicated: Secondary | ICD-10-CM | POA: Diagnosis not present

## 2020-02-18 ENCOUNTER — Ambulatory Visit: Payer: Medicare Other | Admitting: Allergy & Immunology

## 2020-03-03 ENCOUNTER — Other Ambulatory Visit: Payer: Self-pay

## 2020-03-03 ENCOUNTER — Ambulatory Visit: Payer: Medicare Other | Admitting: Allergy & Immunology

## 2020-03-03 VITALS — BP 168/80 | HR 87 | Resp 16 | Ht 64.0 in | Wt 171.0 lb

## 2020-03-03 DIAGNOSIS — B999 Unspecified infectious disease: Secondary | ICD-10-CM

## 2020-03-03 DIAGNOSIS — J310 Chronic rhinitis: Secondary | ICD-10-CM

## 2020-03-03 DIAGNOSIS — J4550 Severe persistent asthma, uncomplicated: Secondary | ICD-10-CM

## 2020-03-03 NOTE — Progress Notes (Signed)
FOLLOW UP  Date of Service/Encounter:  03/03/20   Assessment:   Severe persistent asthma without complication  Chronic rhinitis (last testing performed in [place])  Recurrent infections - with normal workup in the past   Rheumatoid arthritis - recently transitioned to Enbrel  Fibromyalgia  Recent history of stroke (September 2020)  Plan/Recommendations:   1. Chronic nonseasonal allergic rhinitis - Continue with fluticasone two sprays per nostril once daily. - Continue with cetirizine (Zyrtec).   - Prednisone pack provided.   2. Moderate persistent asthma, uncomplicated - Lung function looked amazing today.  - Daily controller medication(s): Fasenra every 8 weeks - Prior to physical activity: albuterol 2 puffs 10-15 minutes before physical activity. - Rescue medications: albuterol 4 puffs every 4-6 hours as needed - Changes during respiratory infections or worsening symptoms: Add on Arnuity 1 puff once daily for TWO WEEKS. - Asthma control goals:  * Full participation in all desired activities (may need albuterol before activity) * Albuterol use two time or less a week on average (not counting use with activity) * Cough interfering with sleep two time or less a month * Oral steroids no more than once a year * No hospitalizations  3. Return in about 6 months (around 09/03/2020). This can be an in-person, a virtual Webex or a telephone follow up visit.   Subjective:   Betty Golden is a 74 y.o. female presenting today for follow up of  Chief Complaint  Patient presents with  . Allergic Rhinitis     refills    Betty Golden has a history of the following: Patient Active Problem List   Diagnosis Date Noted  . Viral pneumonia 04/20/2019  . TIA (transient ischemic attack) 04/16/2019  . Dyspnea 04/04/2019  . COVID-19 virus infection 04/04/2019  . Gastritis and gastroduodenitis 07/08/2018  . Rectal bleeding 06/29/2018  . Fibromyalgia 09/08/2017   . Spinal stenosis of lumbar region 09/20/2015  . 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 Moderate persistent asthma 07/03/2015  . Allergic rhinitis 07/03/2015  . Colitis due to Clostridium difficile 02/10/2015  . Abdominal pain 02/10/2015  . Nausea and vomiting 02/10/2015  . Acute renal failure (HCC) 02/10/2015  . Dehydration 02/10/2015  . Hypoxia 02/10/2015  . Hyponatremia 02/10/2015  . Hypokalemia 02/10/2015  . COPD (chronic obstructive pulmonary disease) (HCC)   . Unspecified constipation 07/15/2013  . DYSPNEA 04/05/2008  . Essential hypertension 03/10/2008  . Headache(784.0) 03/10/2008  . Cough 03/10/2008    History obtained from: chart review and patient.  Betty Golden is a 74 y.o. female presenting for a follow up visit. She was last seen in March 2021. At that time, we continued with fluticasone two sprays per nostril daily as well as cetirizine. We also added on a prednisone burst. For her asthma, we restarted her Harrington Challenger since she had missed a dose. Her breathing test looked amazing. We continued with albuterol and Arnuity as needed.    Since the last visit, she has done fairly well. She is fully immunized to COVID19. She tolerated the vaccinations without adverse event.   Asthma/Respiratory Symptom History: She is on the Harrington Challenger which is working well. She does have the Arnuity to use as needed. She has done well on this current regimen. She has not needed any prednisone for her breathing at all since her last visit. This is the only clinic where she receives prednisone for her breathing/sinus issues.   Allergic Rhinitis Symptom History: She reports that she has been having sinus pain for  a while. She reports s a headache and cheek pain.  She had an immune workup in May 2020 that was normal. She is now on Embrel. Humira ws not working. She started the new regimen this week. If this does not work, they are  thinking of doing IV infusion of some sort.   Otherwise, there have been no changes  to her past medical history, surgical history, family history, or social history.    Review of Systems  Constitutional: Negative.  Negative for chills, fever, malaise/fatigue and weight loss.  HENT: Positive for congestion and sinus pain. Negative for ear discharge and ear pain.   Eyes: Negative for pain, discharge and redness.  Respiratory: Negative for cough, sputum production, shortness of breath and wheezing.   Cardiovascular: Negative.  Negative for chest pain and palpitations.  Gastrointestinal: Negative for abdominal pain, constipation, diarrhea, heartburn, nausea and vomiting.  Skin: Negative.  Negative for itching and rash.  Neurological: Negative for dizziness and headaches.  Endo/Heme/Allergies: Positive for environmental allergies. Does not bruise/bleed easily.       Objective:   Blood pressure (!) 168/80, pulse 87, resp. rate 16, height 5\' 4"  (1.626 m), weight 171 lb (77.6 kg), SpO2 96 %. Body mass index is 29.35 kg/m.   Physical Exam:  Physical Exam Constitutional:      Appearance: She is well-developed.  HENT:     Head: Normocephalic and atraumatic.     Right Ear: Tympanic membrane, ear canal and external ear normal.     Left Ear: Tympanic membrane and ear canal normal.     Nose: No nasal deformity, septal deviation, mucosal edema or rhinorrhea.     Right Turbinates: Swollen.     Left Turbinates: Swollen.     Right Sinus: No maxillary sinus tenderness or frontal sinus tenderness.     Left Sinus: No maxillary sinus tenderness or frontal sinus tenderness.     Mouth/Throat:     Mouth: Mucous membranes are not pale and not dry.     Pharynx: Uvula midline.  Eyes:     General:        Right eye: No discharge.        Left eye: No discharge.     Conjunctiva/sclera: Conjunctivae normal.     Right eye: Right conjunctiva is not injected. No chemosis.    Left eye: Left conjunctiva is not injected. No chemosis.    Pupils: Pupils are equal, round, and reactive to  light.  Cardiovascular:     Rate and Rhythm: Normal rate and regular rhythm.     Heart sounds: Normal heart sounds.  Pulmonary:     Effort: Pulmonary effort is normal. No tachypnea, accessory muscle usage, prolonged expiration, respiratory distress or retractions.     Breath sounds: Normal breath sounds. No wheezing, rhonchi or rales.  Chest:     Chest wall: No tenderness.  Lymphadenopathy:     Cervical: No cervical adenopathy.  Skin:    Coloration: Skin is not pale.     Findings: No abrasion, erythema, petechiae or rash. Rash is not papular, urticarial or vesicular.  Neurological:     Mental Status: She is alert.      Diagnostic studies:    Spirometry: results normal (FEV1: 1.58/88%, FVC: 2.10/91%, FEV1/FVC: 75%).    Spirometry consistent with normal pattern.   Allergy Studies: none       02-11-1997, MD  [facility]

## 2020-03-03 NOTE — Patient Instructions (Addendum)
1. Chronic nonseasonal allergic rhinitis - Continue with fluticasone two sprays per nostril once daily. - Continue with cetirizine (Zyrtec).   - Prednisone pack provided.   2. Moderate persistent asthma, uncomplicated - Lung function looked amazing today.  - Daily controller medication(s): Fasenra every 8 weeks - Prior to physical activity: albuterol 2 puffs 10-15 minutes before physical activity. - Rescue medications: albuterol 4 puffs every 4-6 hours as needed - Changes during respiratory infections or worsening symptoms: Add on Arnuity 1 puff once daily for TWO WEEKS. - Asthma control goals:  * Full participation in all desired activities (may need albuterol before activity) * Albuterol use two time or less a week on average (not counting use with activity) * Cough interfering with sleep two time or less a month * Oral steroids no more than once a year * No hospitalizations  3. Return in about 6 months (around 09/03/2020). This can be an in-person, a virtual Webex or a telephone follow up visit.   Please inform us of any Emergency Department visits, hospitalizations, or changes in symptoms. Call us before going to the ED for breathing or allergy symptoms since we might be able to fit you in for a sick visit. Feel free to contact us anytime with any questions, problems, or concerns.  It was a pleasure to see you again today!  Websites that have reliable patient information: 1. American Academy of Asthma, Allergy, and Immunology: www.aaaai.org 2. Food Allergy Research and Education (FARE): foodallergy.org 3. Mothers of Asthmatics: http://www.asthmacommunitynetwork.org 4. American College of Allergy, Asthma, and Immunology: www.acaai.org  Like [place] on [employer] and Instagram for our latest updates!      Make sure you are registered to vote! If you have moved or changed any of your contact information, you will need to get this updated before voting!  In some cases, you MAY be  able to register to vote online: [email or web address]

## 2020-03-05 ENCOUNTER — Encounter: Payer: Self-pay | Admitting: Allergy & Immunology

## 2020-04-07 ENCOUNTER — Other Ambulatory Visit: Payer: Self-pay

## 2020-04-07 ENCOUNTER — Ambulatory Visit (INDEPENDENT_AMBULATORY_CARE_PROVIDER_SITE_OTHER): Payer: Medicare Other

## 2020-04-07 DIAGNOSIS — J4550 Severe persistent asthma, uncomplicated: Secondary | ICD-10-CM

## 2020-04-09 ENCOUNTER — Other Ambulatory Visit: Payer: Self-pay | Admitting: Allergy & Immunology

## 2020-05-31 ENCOUNTER — Ambulatory Visit (INDEPENDENT_AMBULATORY_CARE_PROVIDER_SITE_OTHER): Payer: Medicare Other | Admitting: Family

## 2020-05-31 ENCOUNTER — Other Ambulatory Visit: Payer: Self-pay

## 2020-05-31 ENCOUNTER — Encounter: Payer: Self-pay | Admitting: Family

## 2020-05-31 ENCOUNTER — Ambulatory Visit: Payer: Self-pay

## 2020-05-31 VITALS — BP 140/80 | HR 76 | Temp 98.1°F | Resp 18

## 2020-05-31 DIAGNOSIS — B999 Unspecified infectious disease: Secondary | ICD-10-CM

## 2020-05-31 DIAGNOSIS — J4550 Severe persistent asthma, uncomplicated: Secondary | ICD-10-CM | POA: Diagnosis not present

## 2020-05-31 DIAGNOSIS — J310 Chronic rhinitis: Secondary | ICD-10-CM

## 2020-05-31 NOTE — Progress Notes (Signed)
[address] [name] [address] Dept: [phone]  FOLLOW UP NOTE  Patient ID: Betty Golden, female    DOB: 05-17-1946  Age: 74 y.o. MRN: [id number] Date of Office Visit: 05/31/2020  Assessment  Chief Complaint: Sinusitis (sinus headache, sinus pressure under eyes, some yelowwish tint to the nasal drainage. ongoing x1 week. no fever, no body aches or chills. )  HPI Betty Golden is a 74 year old female who presents today for an acute visit.  She was last seen on March 03, 2020 by Dr. Dellis Anes for severe persistent asthma, chronic nonseasonal allergic rhinitis, recurrent infections with normal work-up in May 2020, fibromyalgia on Enbrel, and recent history of stroke September 2020.  Severe persistent asthma is reported as not well controlled with Fasenra injection every 8 weeks albuterol as needed and Arnuity for respiratory flareups.  She reports productive cough with light yellow sputum, small amount of tightness in her chest and some shortness of breath. She denies any fever or chills.  For the past week she has been using her albuterol inhaler 2 times a day.  She started her Arnuity 20 mcg 1 puff once a day 3 days ago.  Chronic rhinitis is reported as not well controlled with fluticasone nasal spray 2 sprays each nostril once a day as needed. She has been out of Zyrtec for the past 2 weeks. She reports approximately a week to week and a half ago she began having light yellow rhinorrhea, postnasal drip, nasal congestion, and sinus tenderness.  She reports that she is currently taking Enbrel for her fibromyalgia and that one of the side effects of Enbrel is that it can cause sinus infection.  She reports that she is not allowed to take antibiotics while on Enbrel.  Current medications are as listed in the chart.   Drug Allergies:  Allergies  Allergen Reactions  . Sulfa Antibiotics Rash    Review of Systems: Review of Systems  Constitutional: Negative for  chills and fever.  HENT:       Reports light yellow rhinorrhea, sinus tenderness, post nasal drip, and nasal congestion  Eyes:       Reports occasional itchy watery eyes  Respiratory: Positive for cough and shortness of breath. Negative for wheezing.   Cardiovascular: Negative for chest pain and palpitations.  Gastrointestinal: Negative for abdominal pain and heartburn.  Genitourinary: Negative for dysuria.  Skin: Negative for itching and rash.  Neurological: Negative for headaches.    Physical Exam: BP 140/80 (BP Location: Left Arm, Patient Position: Sitting, Cuff Size: Normal)   Pulse 76   Temp 98.1 F (36.7 C) (Temporal)   Resp 18   SpO2 98%    Physical Exam Constitutional:      Appearance: Normal appearance.  HENT:     Head: Normocephalic and atraumatic.     Comments: Pharynx normal. Eyes normal. Ears normal. Nose: mildly edematous with light yellow drainage noted    Right Ear: Tympanic membrane, ear canal and external ear normal.     Left Ear: Tympanic membrane, ear canal and external ear normal.     Mouth/Throat:     Mouth: Mucous membranes are moist.     Pharynx: Oropharynx is clear.  Eyes:     Conjunctiva/sclera: Conjunctivae normal.  Cardiovascular:     Rate and Rhythm: Regular rhythm.     Heart sounds: Normal heart sounds.  Pulmonary:     Effort: Pulmonary effort is normal.     Breath sounds: Normal breath sounds.  Comments: Lungs clear to auscultation Skin:    General: Skin is warm.  Neurological:     Mental Status: She is alert and oriented to person, place, and time.  Psychiatric:        Mood and Affect: Mood normal.        Behavior: Behavior normal.        Thought Content: Thought content normal.        Judgment: Judgment normal.     Diagnostics: FVC 1.91 L, FEV1 1.47 L.  Predicted FVC 2.61 L, FEV1 2.03 L.  Spirometry indicates mild restriction.  Status post bronchodilator response shows FVC 2.10 L, FEV1 1.62 L.  Spirometry indicates normal  ventilatory function with a 10% change in FEV1.  Assessment and Plan: 1. Not well controlled severe persistent asthma   2. Chronic rhinitis   3. Recurrent infections     No orders of the defined types were placed in this encounter.   Patient Instructions  Severe persistent asthma Continue Arnuity 20 mcg 1 puff once a day for the next two weeks and then stop and use as below for respiratory flares. Continue Fasentra 30 mg injection every eight weeks Continue albuterol 2 puffs every 4 hours as needed for cough, wheeze, tightness in chest, or shortness of breath. Also, may use albuterol 2 puffs 5-15 minutes before exercise For respiratory flares begin Arnuity 20 mcg 1 puff once a day for two weeks. Asthma control goals:   Full participation in all desired activities (may need albuterol before activity)  Albuterol use two time or less a week on average (not counting use with activity)  Cough interfering with sleep two time or less a month  Oral steroids no more than once a year  No hospitalizations  Chronic non-seasonal allergic rhinitis Start prednisone 10 mg taking 2 tablets twice a day for 3 days, on the fourth day take 2 tablets in the morning, on the 5th day take one tablet and stop Continue fluticasone nasal spray 2 sprays each nostril once a day as needed for stuffy nose. Continue cetirizine 10 mg once a day as needed for runny nose or itching At your next appointment lets do skin testing to environmental inhalents. Please remember to remain off all antihistamines (Zyrtec) 3 days prior  Recurrent infections Speak with your Rheumatologist about Enbrel causing recurrent sinus infections  Plesae let us know if this treatment plan is not working well for you. Schedule a follow up appointment in 2-3  months   Return in about 3 months (around 08/31/2020), or if symptoms worsen or fail to improve.    Thank you for the opportunity to care for this patient.  Please do not  hesitate to contact me with questions.  Nehemiah Settle, FNP [facility]

## 2020-05-31 NOTE — Patient Instructions (Addendum)
Severe persistent asthma Continue Arnuity 20 mcg 1 puff once a day for the next two weeks and then stop and use as below for respiratory flares. Continue Fasentra 30 mg injection every eight weeks Continue albuterol 2 puffs every 4 hours as needed for cough, wheeze, tightness in chest, or shortness of breath. Also, may use albuterol 2 puffs 5-15 minutes before exercise For respiratory flares begin Arnuity 20 mcg 1 puff once a day for two weeks. Asthma control goals:   Full participation in all desired activities (may need albuterol before activity)  Albuterol use two time or less a week on average (not counting use with activity)  Cough interfering with sleep two time or less a month  Oral steroids no more than once a year  No hospitalizations  Chronic non-seasonal allergic rhinitis Start prednisone 10 mg taking 2 tablets twice a day for 3 days, on the fourth day take 2 tablets in the morning, on the 5th day take one tablet and stop Continue fluticasone nasal spray 2 sprays each nostril once a day as needed for stuffy nose. Continue cetirizine 10 mg once a day as needed for runny nose or itching At your next appointment lets do skin testing to environmental inhalents. Please remember to remain off all antihistamines (Zyrtec) 3 days prior  Recurrent infections Speak with your Rheumatologist about Enbrel causing recurrent sinus infections  Plesae let us know if this treatment plan is not working well for you. Schedule a follow up appointment in 2-3  months

## 2020-07-26 ENCOUNTER — Ambulatory Visit: Payer: Medicare Other

## 2020-07-26 ENCOUNTER — Other Ambulatory Visit: Payer: Self-pay

## 2020-08-04 ENCOUNTER — Ambulatory Visit: Payer: Medicare Other

## 2020-08-04 ENCOUNTER — Ambulatory Visit (INDEPENDENT_AMBULATORY_CARE_PROVIDER_SITE_OTHER): Payer: Medicare Other

## 2020-08-04 DIAGNOSIS — J4550 Severe persistent asthma, uncomplicated: Secondary | ICD-10-CM | POA: Diagnosis not present

## 2020-09-06 ENCOUNTER — Ambulatory Visit: Payer: Medicare Other | Admitting: Allergy & Immunology

## 2020-09-29 ENCOUNTER — Other Ambulatory Visit: Payer: Self-pay

## 2020-09-29 ENCOUNTER — Encounter: Payer: Self-pay | Admitting: Allergy & Immunology

## 2020-09-29 ENCOUNTER — Ambulatory Visit: Payer: Medicare Other | Admitting: Allergy & Immunology

## 2020-09-29 ENCOUNTER — Ambulatory Visit (INDEPENDENT_AMBULATORY_CARE_PROVIDER_SITE_OTHER): Payer: Medicare Other

## 2020-09-29 VITALS — BP 140/78 | HR 83 | Resp 17

## 2020-09-29 DIAGNOSIS — J3089 Other allergic rhinitis: Secondary | ICD-10-CM

## 2020-09-29 DIAGNOSIS — J4550 Severe persistent asthma, uncomplicated: Secondary | ICD-10-CM

## 2020-09-29 DIAGNOSIS — B999 Unspecified infectious disease: Secondary | ICD-10-CM

## 2020-09-29 DIAGNOSIS — J0141 Acute recurrent pansinusitis: Secondary | ICD-10-CM

## 2020-09-29 MED ORDER — DOXYCYCLINE MONOHYDRATE 100 MG PO TABS: 100.0000 mg | ORAL_TABLET | Freq: Two times a day (BID) | ORAL | 0 refills | Status: AC

## 2020-09-29 NOTE — Progress Notes (Signed)
FOLLOW UP  Date of Service/Encounter:  09/29/20   Assessment:   Severe persistent asthma without complication   Chronic rhinitis (last testing performed in [place])   Recurrent infections - with normal workup in the past (May 2020)   Rheumatoid arthritis - recently transitioned to Enbrel   Fibromyalgia   Recent history of stroke (September 2020)   Betty Golden presents for follow-up visit.  As with every visit, she is requesting prednisone and now antibiotics for presumed sinusitis.  Her exam today is completely normal.  She might have a little bit of mucus on the right I did recommend that she increase her Flonase to twice a day and we added on Astelin to see if I can help you prevent the need for steroids and antibiotics in the future.  We discussed the side effects of steroids as well as the long-term risks.  She is going to try this new regimen to see if we can decrease around steroid she is getting.  We might even consider changing her to a different biologic.  She has previously had a normal immune work-up.  Plan/Recommendations:   1. Chronic nonseasonal allergic rhinitis - Continue with fluticasone two sprays per nostril once daily. - Increase too TWICE DAILY when you feel that your sinuses are acting up.  - Add azelastine nasal spray two sprays per nostril at night. - Continue with cetirizine (Zyrtec).   - Start the prednisone and the antibiotics if you are not feeling better in a few days.  - Maybe this combination will prevent the need for systemic steroids and antibiotics in the future.    2. Moderate persistent asthma, uncomplicated - Lung function looked stable today.  - Daily controller medication(s): Fasenra every 8 weeks - Prior to physical activity: albuterol 2 puffs 10-15 minutes before physical activity. - Rescue medications: albuterol 4 puffs every 4-6 hours as needed - Changes during respiratory infections or worsening symptoms: Add on Arnuity 1  puff once daily for TWO WEEKS. - Asthma control goals:  * Full participation in all desired activities (may need albuterol before activity) * Albuterol use two time or less a week on average (not counting use with activity) * Cough interfering with sleep two time or less a month * Oral steroids no more than once a year * No hospitalizations  3. Return in about 6 months (around 04/01/2021).   Subjective:   Betty Golden is a 75 y.o. female presenting today for follow up of No chief complaint on file.   Betty Golden has a history of the following: Patient Active Problem List   Diagnosis Date Noted  . Viral pneumonia 04/20/2019  . TIA (transient ischemic attack) 04/16/2019  . Dyspnea 04/04/2019  . COVID-19 virus infection 04/04/2019  . Gastritis and gastroduodenitis 07/08/2018  . Rectal bleeding 06/29/2018  . Fibromyalgia 09/08/2017  . Spinal stenosis of lumbar region 09/20/2015  . 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 Moderate persistent asthma 07/03/2015  . Allergic rhinitis 07/03/2015  . Colitis due to Clostridium difficile 02/10/2015  . Abdominal pain 02/10/2015  . Nausea and vomiting 02/10/2015  . Acute renal failure (HCC) 02/10/2015  . Dehydration 02/10/2015  . Hypoxia 02/10/2015  . Hyponatremia 02/10/2015  . Hypokalemia 02/10/2015  . COPD (chronic obstructive pulmonary disease) (HCC)   . Unspecified constipation 07/15/2013  . DYSPNEA 04/05/2008  . Essential hypertension 03/10/2008  . Headache(784.0) 03/10/2008  . Cough 03/10/2008    History obtained from: chart review and patient.  Betty Golden  is a 75 y.o. female presenting for a follow up visit. She was last seen in November 2021.  At that time, we continue with Arnuity 200 mcg 1 puff once daily as well as Fasenra every 8 weeks.  She also continue with albuterol as needed.  For her allergic rhinitis, we did start her on a prednisone burst.  We also continue with cetirizine and Flonase.  Evidently, there was concern from  the patient's point of view that her Enbrel was causing recurrent sinus infections.  Since last visit, she reports that she has done fine.  Asthma/Respiratory Symptom History: Asthma has been well controlled. She does have Arnuity that she uses occasionally. She uses it when she is having more wheezing. She also has ProAir to use as needed. She does need a script. ACT score is 18 today indicating excellent asthma control. All of her asthma controllers were the same price hich is one of the reason that she does not use one on a daily basis.   Allergic Rhinitis Symptom History: She is Flonase two sprays per nostril in the morning. She never goes up to twice daily. She is not on allergy shots.  She did have prednisone at the last visit, but other than that she has not needed antibiotics.   Otherwise, there have been no changes to her past medical history, surgical history, family history, or social history.    Review of Systems  Constitutional: Negative.  Negative for chills, fever, malaise/fatigue and weight loss.  HENT: Positive for congestion and sinus pain. Negative for ear discharge and ear pain.   Eyes: Negative for pain, discharge and redness.  Respiratory: Negative for cough, sputum production, shortness of breath and wheezing.   Cardiovascular: Negative.  Negative for chest pain and palpitations.  Gastrointestinal: Negative for abdominal pain, constipation, diarrhea, heartburn, nausea and vomiting.  Skin: Negative.  Negative for itching and rash.  Neurological: Negative for dizziness and headaches.  Endo/Heme/Allergies: Positive for environmental allergies. Does not bruise/bleed easily.       Objective:   Blood pressure 140/78, pulse 83, resp. rate 17, SpO2 99 %. There is no height or weight on file to calculate BMI.   Physical Exam:  Physical Exam Constitutional:      Appearance: She is well-developed.     Comments: Talkative female.  Smiling.  HENT:     Head:  Normocephalic and atraumatic.     Right Ear: Tympanic membrane, ear canal and external ear normal.     Left Ear: Tympanic membrane, ear canal and external ear normal.     Nose: No nasal deformity, septal deviation, mucosal edema, rhinorrhea or epistaxis.     Right Turbinates: Enlarged and swollen.     Left Turbinates: Enlarged and swollen.     Right Sinus: No maxillary sinus tenderness or frontal sinus tenderness.     Left Sinus: No maxillary sinus tenderness or frontal sinus tenderness.     Comments: No purulent discharge.  Bilateral maxillary tenderness.    Mouth/Throat:     Mouth: Oropharynx is clear and moist. Mucous membranes are not pale and not dry.     Pharynx: Uvula midline.  Eyes:     General: Lids are normal. Allergic shiner present.        Right eye: No discharge.        Left eye: No discharge.     Extraocular Movements: EOM normal.     Conjunctiva/sclera: Conjunctivae normal.     Right eye: Right conjunctiva is not  injected. No chemosis.    Left eye: Left conjunctiva is not injected. No chemosis.    Pupils: Pupils are equal, round, and reactive to light.  Cardiovascular:     Rate and Rhythm: Normal rate and regular rhythm.     Heart sounds: Normal heart sounds.  Pulmonary:     Effort: Pulmonary effort is normal. No tachypnea, accessory muscle usage or respiratory distress.     Breath sounds: Normal breath sounds. No wheezing, rhonchi or rales.  Chest:     Chest wall: No tenderness.  Lymphadenopathy:     Cervical: No cervical adenopathy.  Skin:    General: Skin is warm.     Capillary Refill: Capillary refill takes less than 2 seconds.     Coloration: Skin is not pale.     Findings: No abrasion, erythema, petechiae or rash. Rash is not papular, urticarial or vesicular.  Neurological:     Mental Status: She is alert.  Psychiatric:        Mood and Affect: Mood and affect normal.        Behavior: Behavior is cooperative.      Diagnostic studies:    Spirometry:  results normal (FEV1: 1.39/79%, FVC: 1.72/75%, FEV1/FVC: 81%).    Spirometry consistent with normal pattern.   Allergy Studies: none        [name], MD  [facility]

## 2020-09-29 NOTE — Patient Instructions (Addendum)
1. Chronic nonseasonal allergic rhinitis - Continue with fluticasone two sprays per nostril once daily. - Increase too TWICE DAILY when you feel that your sinuses are acting up.  - Add azelastine nasal spray two sprays per nostril at night. - Continue with cetirizine (Zyrtec).   - Start the prednisone and the antibiotics if you are not feeling better in a few days.  - Maybe this combination will prevent the need for systemic steroids and antibiotics in the future.    2. Moderate persistent asthma, uncomplicated - Lung function looked stable today.  - Daily controller medication(s): Fasenra every 8 weeks - Prior to physical activity: albuterol 2 puffs 10-15 minutes before physical activity. - Rescue medications: albuterol 4 puffs every 4-6 hours as needed - Changes during respiratory infections or worsening symptoms: Add on Arnuity 1 puff once daily for TWO WEEKS. - Asthma control goals:  * Full participation in all desired activities (may need albuterol before activity) * Albuterol use two time or less a week on average (not counting use with activity) * Cough interfering with sleep two time or less a month * Oral steroids no more than once a year * No hospitalizations  3. Return in about 6 months (around 04/01/2021).    Please inform us of any Emergency Department visits, hospitalizations, or changes in symptoms. Call us before going to the ED for breathing or allergy symptoms since we might be able to fit you in for a sick visit. Feel free to contact us anytime with any questions, problems, or concerns.  It was a pleasure to see you again today!  Websites that have reliable patient information: 1. American Academy of Asthma, Allergy, and Immunology: www.aaaai.org 2. Food Allergy Research and Education (FARE): foodallergy.org 3. Mothers of Asthmatics: http://www.asthmacommunitynetwork.org 4. American College of Allergy, Asthma, and Immunology: www.acaai.org  "Like" [place] on  Facebook and Instagram for our latest updates!      Make sure you are registered to vote! If you have moved or changed any of your contact information, you will need to get this updated before voting!  In some cases, you MAY be able to register to vote online: [email or web address]

## 2020-09-29 NOTE — Addendum Note (Signed)
Addended by: Dub Mikes on: 09/29/2020 04:58 PM   Modules accepted: Orders

## 2020-11-01 ENCOUNTER — Ambulatory Visit: Payer: Medicare Other

## 2020-11-14 ENCOUNTER — Other Ambulatory Visit: Payer: Self-pay | Admitting: Allergy & Immunology

## 2020-11-22 ENCOUNTER — Ambulatory Visit (INDEPENDENT_AMBULATORY_CARE_PROVIDER_SITE_OTHER): Payer: Medicare Other

## 2020-11-22 ENCOUNTER — Other Ambulatory Visit: Payer: Self-pay

## 2020-11-22 DIAGNOSIS — J4550 Severe persistent asthma, uncomplicated: Secondary | ICD-10-CM | POA: Diagnosis not present

## 2020-11-24 ENCOUNTER — Ambulatory Visit: Payer: Self-pay

## 2020-11-24 ENCOUNTER — Ambulatory Visit: Payer: Medicare Other

## 2020-12-15 ENCOUNTER — Ambulatory Visit: Payer: Medicare Other

## 2020-12-20 ENCOUNTER — Ambulatory Visit: Payer: Medicare Other

## 2020-12-22 ENCOUNTER — Ambulatory Visit (INDEPENDENT_AMBULATORY_CARE_PROVIDER_SITE_OTHER): Payer: Medicare Other

## 2020-12-22 ENCOUNTER — Other Ambulatory Visit: Payer: Self-pay

## 2020-12-22 DIAGNOSIS — Z23 Encounter for immunization: Secondary | ICD-10-CM

## 2020-12-22 NOTE — Progress Notes (Signed)
   Covid-19 Vaccination Clinic  Name:  RASHAWNA SCOLES    MRN: [id number] DOB: [birth year]  12/22/2020  Ms. Rusnak was observed post Covid-19 immunization for 15 minutes without incident. She was provided with Vaccine Information Sheet and instruction to access the V-Safe system.   Ms. Provencal was instructed to call 911 with any severe reactions post vaccine: Marland Kitchen Difficulty breathing  . Swelling of face and throat  . A fast heartbeat  . A bad rash all over body  . Dizziness and weakness   Immunizations Administered    Name Date Dose VIS Date Route   Moderna Covid-19 Booster Vaccine 12/22/2020  1:59 PM 0.25 mL 05/17/2020 Intramuscular   Manufacturer: Moderna   Lot: [id number]   NDC: 56861-683-72

## 2021-01-17 ENCOUNTER — Ambulatory Visit: Payer: Self-pay

## 2021-01-24 ENCOUNTER — Ambulatory Visit (INDEPENDENT_AMBULATORY_CARE_PROVIDER_SITE_OTHER): Payer: Medicare Other

## 2021-01-24 ENCOUNTER — Other Ambulatory Visit: Payer: Self-pay

## 2021-01-24 DIAGNOSIS — J4550 Severe persistent asthma, uncomplicated: Secondary | ICD-10-CM

## 2021-02-06 ENCOUNTER — Encounter (HOSPITAL_COMMUNITY): Payer: Self-pay | Admitting: Emergency Medicine

## 2021-02-06 ENCOUNTER — Other Ambulatory Visit: Payer: Self-pay

## 2021-02-06 DIAGNOSIS — I2510 Atherosclerotic heart disease of native coronary artery without angina pectoris: Secondary | ICD-10-CM | POA: Diagnosis not present

## 2021-02-06 DIAGNOSIS — Z8616 Personal history of COVID-19: Secondary | ICD-10-CM | POA: Diagnosis not present

## 2021-02-06 DIAGNOSIS — J449 Chronic obstructive pulmonary disease, unspecified: Secondary | ICD-10-CM | POA: Diagnosis not present

## 2021-02-06 DIAGNOSIS — J4521 Mild intermittent asthma with (acute) exacerbation: Secondary | ICD-10-CM | POA: Insufficient documentation

## 2021-02-06 DIAGNOSIS — Z7951 Long term (current) use of inhaled steroids: Secondary | ICD-10-CM | POA: Diagnosis not present

## 2021-02-06 DIAGNOSIS — Z7982 Long term (current) use of aspirin: Secondary | ICD-10-CM | POA: Insufficient documentation

## 2021-02-06 DIAGNOSIS — I10 Essential (primary) hypertension: Secondary | ICD-10-CM | POA: Diagnosis not present

## 2021-02-06 DIAGNOSIS — Z79899 Other long term (current) drug therapy: Secondary | ICD-10-CM | POA: Diagnosis not present

## 2021-02-06 DIAGNOSIS — Z20822 Contact with and (suspected) exposure to covid-19: Secondary | ICD-10-CM | POA: Insufficient documentation

## 2021-02-06 DIAGNOSIS — R0602 Shortness of breath: Secondary | ICD-10-CM | POA: Diagnosis present

## 2021-02-06 DIAGNOSIS — Z87891 Personal history of nicotine dependence: Secondary | ICD-10-CM | POA: Diagnosis not present

## 2021-02-06 NOTE — ED Triage Notes (Signed)
Pt c/o increased sob x 3 days and arthritis/fibromyalgia pain x one week.

## 2021-02-07 ENCOUNTER — Other Ambulatory Visit: Payer: Self-pay

## 2021-02-07 ENCOUNTER — Emergency Department (HOSPITAL_COMMUNITY)
Admission: EM | Admit: 2021-02-07 | Discharge: 2021-02-07 | Disposition: A | Discharge status: Home / Self Care | Payer: Medicare Other | Attending: Emergency Medicine | Admitting: Emergency Medicine

## 2021-02-07 ENCOUNTER — Emergency Department (HOSPITAL_COMMUNITY): Payer: Medicare Other

## 2021-02-07 DIAGNOSIS — J4521 Mild intermittent asthma with (acute) exacerbation: Secondary | ICD-10-CM

## 2021-02-07 LAB — RESP PANEL BY RT-PCR (FLU A&B, COVID) ARPGX2
Influenza A by PCR: NEGATIVE
Influenza B by PCR: NEGATIVE
SARS Coronavirus 2 by RT PCR: NEGATIVE

## 2021-02-07 MED ORDER — IPRATROPIUM-ALBUTEROL 0.5-2.5 (3) MG/3ML IN SOLN
3.0000 mL | Freq: Once | RESPIRATORY_TRACT | Status: AC
  Administered 2021-02-07: 3 mL via RESPIRATORY_TRACT
  Filled 2021-02-07: qty 3

## 2021-02-07 MED ORDER — DEXAMETHASONE SODIUM PHOSPHATE 10 MG/ML IJ SOLN
10.0000 mg | Freq: Once | INTRAMUSCULAR | Status: AC
  Administered 2021-02-07: 10 mg via INTRAVENOUS
  Filled 2021-02-07: qty 1

## 2021-02-07 NOTE — Discharge Instructions (Addendum)
You were seen today for asthma.  Use your inhaler as needed especially over the next 6 to 12 hours.  You were given a dose of long-acting steroid while in the emergency department.  This should cover you for your exacerbation.

## 2021-02-07 NOTE — ED Provider Notes (Signed)
[facility] EMERGENCY DEPARTMENT Provider Note   CSN: [id number] Arrival date & time: 02/06/21  2333     History Chief Complaint  Patient presents with   Shortness of Breath    Betty Golden is a 75 y.o. female.  HPI     This is a 75 year old female with a history of fibromyalgia, coronary artery disease, hypertension reported history of asthma who presents with shortness of breath.  Patient reports that she feels like she is having an asthma flare.  She has never had to be hospitalized for her asthma.  She states that her fibromyalgia was beginning to act up" sometimes it will set off my asthma."  She has used her inhaler with minimal relief.  No fevers.  She does endorse a nonproductive cough.  No known sick contacts or COVID exposures.  Denies chest pain but does report some pressure.  No lower extremity edema.  Past Medical History:  Diagnosis Date   Arthritis    Asthma    C. difficile colitis    Cervical stenosis of spine    Chronic headaches    Chronic neck pain    Coronary artery disease    Depression    Diverticulosis    Essential hypertension    Fibromyalgia    GERD (gastroesophageal reflux disease)    History of Salmonella gastroenteritis    HNP (herniated nucleus pulposus), lumbar    Hyperlipidemia    IBS (irritable bowel syndrome)    Lung nodule     Patient Active Problem List   Diagnosis Date Noted   Viral pneumonia 04/20/2019   TIA (transient ischemic attack) 04/16/2019   Dyspnea 04/04/2019   COVID-19 virus infection 04/04/2019   Gastritis and gastroduodenitis 07/08/2018   Rectal bleeding 06/29/2018   Fibromyalgia 09/08/2017   Spinal stenosis of lumbar region 09/20/2015   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/03/2015   Moderate persistent asthma 07/03/2015   Allergic rhinitis 07/03/2015   Colitis due to Clostridium difficile 02/10/2015   Abdominal pain 02/10/2015   Nausea and vomiting 02/10/2015   Acute renal failure (HCC) 02/10/2015   Dehydration 02/10/2015    Hypoxia 02/10/2015   Hyponatremia 02/10/2015   Hypokalemia 02/10/2015   COPD (chronic obstructive pulmonary disease) (HCC)    Unspecified constipation 07/15/2013   DYSPNEA 04/05/2008   Essential hypertension 03/10/2008   Headache(784.0) 03/10/2008   Cough 03/10/2008    Past Surgical History:  Procedure Laterality Date   APPENDECTOMY  1965   BIOPSY  08/26/2018   Procedure: BIOPSY;  Surgeon: Malissa Hippo, MD;  Location: [facility];  Service: Endoscopy;;  gastric   BREAST BIOPSY     BREAST REDUCTION SURGERY  1994   CHOLECYSTECTOMY  1975   COLONOSCOPY N/A 08/19/2013   Procedure: COLONOSCOPY;  Surgeon: Malissa Hippo, MD;  Location: [facility];  Service: Endoscopy;  Laterality: N/A;  155-moved to 140 Ann to notify pt   COLONOSCOPY N/A 08/26/2018   Procedure: COLONOSCOPY;  Surgeon: Malissa Hippo, MD;  Location: [facility];  Service: Endoscopy;  Laterality: N/A;   ESOPHAGOGASTRODUODENOSCOPY N/A 08/26/2018   Procedure: ESOPHAGOGASTRODUODENOSCOPY (EGD);  Surgeon: Malissa Hippo, MD;  Location: [facility];  Service: Endoscopy;  Laterality: N/A;   fibromyalgia     LUMBAR LAMINECTOMY/DECOMPRESSION MICRODISCECTOMY Bilateral 09/20/2015   Procedure: MICRO LUMBAR DECOMPRESSION L5-S1 BILATERAL    (1 LEVEL);  Surgeon: [name], MD;  Location: [facility];  Service: Orthopedics;  Laterality: Bilateral;   rheumatoid arthritis     Sinus Surgery  TONSILLECTOMY  1968     OB History   No obstetric history on file.     Family History  Problem Relation Age of Onset   Heart disease Mother    Hypertension Mother    Asthma Mother    Heart disease Father    Stroke Father    Hypertension Father    Stroke Sister    Asthma Sister    Hypertension Brother    Asthma Brother    Asthma Maternal Grandmother    Heart disease Maternal Grandmother    Diabetes Maternal Grandfather    Asthma Maternal Aunt    Colon cancer Neg Hx     Social History   Tobacco Use   Smoking status:  Former    Packs/day: 2.00    Years: 10.00    Pack years: 20.00    Types: Cigarettes    Quit date: 09/11/1988    Years since quitting: 32.4   Smokeless tobacco: Never   Tobacco comments:    quit 1990  Vaping Use   Vaping Use: Never used  Substance Use Topics   Alcohol use: No    Comment: stopped in 1990   Drug use: No    Comment: hx smoking marijuana and cocaine weekends. Stopped in the 1990s.    Home Medications Prior to Admission medications   Medication Sig Start Date End Date Taking? Authorizing Provider  albuterol (PROAIR HFA) 108 (90 Base) MCG/ACT inhaler Inhale 2 puffs into the lungs every 4 (four) hours as needed for wheezing or shortness of breath. 10/20/19   Alfonse Spruce, MD  ARNUITY ELLIPTA 200 MCG/ACT AEPB TAKE 1 PUFF BY MOUTH EVERY DAY 11/14/20   Alfonse Spruce, MD  aspirin 81 MG chewable tablet Chew 1 tablet (81 mg total) by mouth daily. 04/22/19   Hongalgi, Maximino Greenland, MD  Benralizumab [facility]) 30 MG/ML SOSY Inject into the skin. 09/22/17   [provider]  DULoxetine (CYMBALTA) 20 MG capsule Take 20 mg by mouth 2 (two) times daily.    [provider]  ENBREL SURECLICK 50 MG/ML injection Inject into the skin once a week. 02/29/20   [provider]  fluticasone Aleda Grana) 50 MCG/ACT nasal spray SPRAY 2 SPRAYS INTO EACH NOSTRIL EVERY DAY 04/10/20   Alfonse Spruce, MD  folic acid (FOLVITE) 1 MG tablet Take 1 mg by mouth daily. 09/08/19   [provider]  gabapentin (NEURONTIN) 300 MG capsule Take 900-1,200 mg by mouth See admin instructions. Taking 3 capsules  ( ) in the morning and ( ) 4 capsules in the evening. 06/24/18   [provider]  methotrexate (RHEUMATREX) 2.5 MG tablet SMARTSIG:3 Tablet(s) By Mouth Once a Week 08/19/19   [provider]  montelukast (SINGULAIR) 10 MG tablet Take by mouth. Patient not taking: Reported on 09/29/2020 12/07/19   [provider]  pantoprazole (PROTONIX)  40 MG tablet Take 1 tablet (40 mg total) by mouth daily. 04/06/19   Leroy Sea, MD  simvastatin (ZOCOR) 20 MG tablet Take 20 mg by mouth at bedtime.     [provider]    Allergies    Other and Sulfa antibiotics  Review of Systems   Review of Systems  Constitutional:  Negative for fever.  Respiratory:  Positive for cough, chest tightness, shortness of breath and wheezing.   Cardiovascular:  Negative for chest pain.  Gastrointestinal:  Negative for abdominal pain, nausea and vomiting.  All other systems reviewed and are negative.  Physical Exam Updated Vital  Signs BP (!) 154/87   Pulse 94   Temp 97.7 F (36.5 C)   Resp (!) 26   Ht 1.651 m ( )   Wt 74.4 kg   SpO2 100%   BMI 27.29 kg/m   Physical Exam Vitals and nursing note reviewed.  Constitutional:      Appearance: She is well-developed. She is not ill-appearing.  HENT:     Head: Normocephalic and atraumatic.  Eyes:     Pupils: Pupils are equal, round, and reactive to light.  Cardiovascular:     Rate and Rhythm: Normal rate and regular rhythm.     Heart sounds: Normal heart sounds.  Pulmonary:     Effort: Pulmonary effort is normal. No respiratory distress.     Breath sounds: Wheezing present.     Comments: Fair aeration with end expiratory wheezing in all lung fields Abdominal:     General: Bowel sounds are normal.     Palpations: Abdomen is soft.  Musculoskeletal:     Cervical back: Neck supple.     Right lower leg: No edema.     Left lower leg: No edema.  Skin:    General: Skin is warm and dry.  Neurological:     Mental Status: She is alert and oriented to person, place, and time.  Psychiatric:        Mood and Affect: Mood normal.    ED Results / Procedures / Treatments   Labs (all labs ordered are listed, but only abnormal results are displayed) Labs Reviewed  RESP PANEL BY RT-PCR (FLU A&B, COVID) [id number]    EKG EKG Interpretation  Date/Time:  Wednesday February 07 2021 00:45:03  EDT Ventricular Rate:  102 PR Interval:  164 QRS Duration: 95 QT Interval:  356 QTC Calculation: 416 R Axis:   45 Text Interpretation: Sinus tachycardia Multiform ventricular premature complexes Borderline repolarization abnormality Confirmed by Ross Marcus ([id number]) on 02/07/2021 2:54:08 AM  Radiology DG Chest 2 View  Result Date: 02/07/2021 CLINICAL DATA:  Shortness of breath EXAM: CHEST - 2 VIEW COMPARISON:  04/16/2019 radiograph, 04/07/2019 CT FINDINGS: No consolidation, features of edema, pneumothorax, or effusion. Pulmonary vascularity is normally distributed. The cardiomediastinal contours are unremarkable. No acute osseous or soft tissue abnormality. Coarse calcification in the left breast, stable from prior CT. IMPRESSION: No acute cardiopulmonary abnormality. Electronically Signed   By: Kreg Shropshire M.D.   On: 02/07/2021 00:39    Procedures Procedures   Medications Ordered in ED Medications  ipratropium-albuterol (DUONEB) 0.5-2.5 (3) MG/3ML nebulizer solution 3 mL (3 mLs Nebulization Given 02/07/21 0317)  dexamethasone (DECADRON) injection 10 mg (10 mg Intravenous Given 02/07/21 0136)    ED Course  I have reviewed the triage vital signs and the nursing notes.  Pertinent labs & imaging results that were available during my care of the patient were reviewed by me and considered in my medical decision making (see chart for details).  Clinical Course as of 02/07/21 0418  Wed Feb 07, 2021  0251 Patient reports mild improvement after DuoNeb.  Better aeration on repeat clinical exam.  O2 sats 100%.  COVID testing negative.  Chest x-ray without evidence of pneumonia.  Will repeat DuoNeb and ambulate with pulse ox. [CH]  [id number] Patient much improved.  Aeration much improved and she no longer has any wheezing.  She reports that she ambulated to the bathroom without shortness of breath.  She requests discharge. [CH]    Clinical Course User Index [CH] Sadi Arave, Mayer Masker, MD  MDM  Rules/Calculators/A&P                          Patient presents with shortness of breath.  She is overall nontoxic.  She has some wheezing on exam.  Reports this is consistent with her prior asthma.  Other considerations include infectious etiology.  Chest x-ray obtained.  She has no evidence of pneumothorax or pneumonia.  EKG is without acute ischemic changes or arrhythmia.  COVID testing is negative.  Patient was given Decadron.  She was given serial duo nebs with persistent improvement of her symptoms.  She was able to ambulate at her baseline.  Recommend that she use her inhaler over the next 6 to 12 hours to continue improvement.  Patient stated understanding.  After history, exam, and medical workup I feel the patient has been appropriately medically screened and is safe for discharge home. Pertinent diagnoses were discussed with the patient. Patient was given return precautions.  Final Clinical Impression(s) / ED Diagnoses Final diagnoses:  Mild intermittent asthma with exacerbation    Rx / DC Orders ED Discharge Orders     None        Corwin Kuiken, Mayer Masker, MD 02/07/21 [phone]

## 2021-02-07 NOTE — Progress Notes (Signed)
Patient is on RA at 100%.  BS are clear and diminished, no wheezing.  Went ahead and gave breathing treatment to patient.

## 2021-03-21 ENCOUNTER — Ambulatory Visit: Payer: Self-pay

## 2021-03-30 ENCOUNTER — Ambulatory Visit: Payer: Medicare Other

## 2021-04-06 ENCOUNTER — Other Ambulatory Visit: Payer: Self-pay

## 2021-04-06 ENCOUNTER — Encounter: Payer: Self-pay | Admitting: Allergy & Immunology

## 2021-04-06 ENCOUNTER — Ambulatory Visit: Payer: Medicare Other | Admitting: Allergy & Immunology

## 2021-04-06 ENCOUNTER — Ambulatory Visit: Payer: Medicare Other

## 2021-04-06 VITALS — BP 140/88 | HR 88 | Temp 97.8°F | Resp 16 | Ht 64.0 in | Wt 161.0 lb

## 2021-04-06 DIAGNOSIS — J4550 Severe persistent asthma, uncomplicated: Secondary | ICD-10-CM | POA: Diagnosis not present

## 2021-04-06 DIAGNOSIS — B999 Unspecified infectious disease: Secondary | ICD-10-CM

## 2021-04-06 DIAGNOSIS — J3089 Other allergic rhinitis: Secondary | ICD-10-CM

## 2021-04-06 MED ORDER — ARNUITY ELLIPTA 200 MCG/ACT IN AEPB: INHALATION_SPRAY | RESPIRATORY_TRACT | 3 refills | Status: DC

## 2021-04-06 MED ORDER — ALBUTEROL SULFATE HFA 108 (90 BASE) MCG/ACT IN AERS: 2.0000 | INHALATION_SPRAY | RESPIRATORY_TRACT | 3 refills | Status: DC | PRN

## 2021-04-06 MED ORDER — FLUTICASONE PROPIONATE 50 MCG/ACT NA SUSP: NASAL | 1 refills | Status: DC

## 2021-04-06 NOTE — Patient Instructions (Addendum)
1. Chronic nonseasonal allergic rhinitis - Continue with fluticasone two sprays per nostril once daily. - Increase too TWICE DAILY when you feel that your sinuses are acting up.  - Continue with cetirizine (Zyrtec).     2. Moderate persistent asthma, uncomplicated - Lung function looked stable today. GREAT WORK!  - Daily controller medication(s):  Fasenra every 8 weeks  + Arnuity one puff once daily.  - Prior to physical activity: albuterol 2 puffs 10-15 minutes before physical activity. - Rescue medications: albuterol 4 puffs every 4-6 hours as needed - Changes during respiratory infections or worsening symptoms: Increase Arnuity 1 puff twice daily for TWO WEEKS. - Asthma control goals:  * Full participation in all desired activities (may need albuterol before activity) * Albuterol use two time or less a week on average (not counting use with activity) * Cough interfering with sleep two time or less a month * Oral steroids no more than once a year * No hospitalizations  3. Return in about 6 months (around 10/04/2021).    Please inform us of any Emergency Department visits, hospitalizations, or changes in symptoms. Call us before going to the ED for breathing or allergy symptoms since we might be able to fit you in for a sick visit. Feel free to contact us anytime with any questions, problems, or concerns.  It was a pleasure to see you again today!  Websites that have reliable patient information: 1. American Academy of Asthma, Allergy, and Immunology: www.aaaai.org 2. Food Allergy Research and Education (FARE): foodallergy.org 3. Mothers of Asthmatics: http://www.asthmacommunitynetwork.org 4. American College of Allergy, Asthma, and Immunology: www.acaai.org  "Like" [place] on Facebook and Instagram for our latest updates!      Make sure you are registered to vote! If you have moved or changed any of your contact information, you will need to get this updated before voting!  In  some cases, you MAY be able to register to vote online: [email or web address]

## 2021-04-06 NOTE — Progress Notes (Signed)
FOLLOW UP  Date of Service/Encounter:  04/06/21   Assessment:   Severe persistent asthma without complication   Chronic rhinitis (last testing performed in [place])   Recurrent infections - with normal workup in the past (May 2020)   Rheumatoid arthritis - recently transitioned to Enbrel   Fibromyalgia   Recent history of stroke (September 2020)  Plan/Recommendations:   1. Chronic nonseasonal allergic rhinitis - Continue with fluticasone two sprays per nostril once daily. - Increase too TWICE DAILY when you feel that your sinuses are acting up.  - Continue with cetirizine (Zyrtec).     2. Moderate persistent asthma, uncomplicated - Lung function looked stable today. GREAT WORK!  - Daily controller medication(s):  Fasenra every 8 weeks  + Arnuity one puff once daily.  - Prior to physical activity: albuterol 2 puffs 10-15 minutes before physical activity. - Rescue medications: albuterol 4 puffs every 4-6 hours as needed - Changes during respiratory infections or worsening symptoms: Increase Arnuity 1 puff twice daily for TWO WEEKS. - Asthma control goals:  * Full participation in all desired activities (may need albuterol before activity) * Albuterol use two time or less a week on average (not counting use with activity) * Cough interfering with sleep two time or less a month * Oral steroids no more than once a year * No hospitalizations  3. Return in about 6 months (around 10/04/2021).   Subjective:   Betty Golden is a 75 y.o. female presenting today for follow up of  Chief Complaint  Patient presents with   Asthma    Betty Golden has a history of the following: Patient Active Problem List   Diagnosis Date Noted   Viral pneumonia 04/20/2019   TIA (transient ischemic attack) 04/16/2019   Dyspnea 04/04/2019   COVID-19 virus infection 04/04/2019   Gastritis and gastroduodenitis 07/08/2018   Rectal bleeding 06/29/2018   Fibromyalgia  09/08/2017   Spinal stenosis of lumbar region 09/20/2015   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/03/2015   Moderate persistent asthma 07/03/2015   Allergic rhinitis 07/03/2015   Colitis due to Clostridium difficile 02/10/2015   Abdominal pain 02/10/2015   Nausea and vomiting 02/10/2015   Acute renal failure (HCC) 02/10/2015   Dehydration 02/10/2015   Hypoxia 02/10/2015   Hyponatremia 02/10/2015   Hypokalemia 02/10/2015   COPD (chronic obstructive pulmonary disease) (HCC)    Unspecified constipation 07/15/2013   DYSPNEA 04/05/2008   Essential hypertension 03/10/2008   Headache(784.0) 03/10/2008   Cough 03/10/2008    History obtained from: chart review and patient.  Betty Golden is a 75 y.o. female presenting for a follow up visit.  She was last seen in March 2022.  At that time, she was endorsing sinusitis, but I felt it was related to environmental allergies.  We increased her fluticasone to twice daily and recommended that she felt that her sinuses were acting up.  We added on Astelin 2 sprays per nostril at night and continue with Zyrtec 10 mg daily.  Her lung function appears stable.  We continue with Fasenra every 8 weeks.  She has Arnuity that she adds during respiratory flares and albuterol to use as needed.  She has had trouble affording medications in the past, therefore she made her controller medication into a as needed medication.  Asthma/Respiratory Symptom History: She has been going well on her asthma. She notices that her fibro flares cause asthma issues. She becomes anxious and this seems to bring up the  wheezing. She was  in the ED one month ago in the middle of the night. She got some steroids and was discharged. It did not seem to work and she waited four days and she was going to call us. She had an appointment with her Rheumatologist who gave her cortisone shot which helped right away. She has been taking the Arnuity since going to the ED.   Allergic Rhinitis Symptom History: She  is using her  fluticasone every day. She is using her cetirizine when things start to get bad. She has not needed any systemic antibiotics or prednisone since the last visit.   She has been on Humira without improvement. She is now on Enbrel. This is not working. She is now going to try Simponi. She also hs fibromyalgia which seems to trigger all of her symptoms.   Otherwise, there have been no changes to her past medical history, surgical history, family history, or social history.    Review of Systems  Constitutional: Negative.  Negative for chills, fever, malaise/fatigue and weight loss.  HENT:  Negative for congestion, ear discharge, ear pain and sinus pain.   Eyes:  Negative for pain, discharge and redness.  Respiratory:  Positive for cough and shortness of breath. Negative for sputum production and wheezing.   Cardiovascular: Negative.  Negative for chest pain and palpitations.  Gastrointestinal:  Negative for abdominal pain, constipation, diarrhea, heartburn, nausea and vomiting.  Skin: Negative.  Negative for itching and rash.  Neurological:  Negative for dizziness and headaches.  Endo/Heme/Allergies:  Positive for environmental allergies. Does not bruise/bleed easily.      Objective:   Blood pressure 140/88, pulse 88, temperature 97.8 F (36.6 C), temperature source Temporal, resp. rate 16, height 5\' 4"  (1.626 m), weight 161 lb (73 kg), SpO2 97 %. Body mass index is 27.64 kg/m.   Physical Exam:  Physical Exam Constitutional:      Appearance: She is well-developed.     Comments: Talkative.   HENT:     Head: Normocephalic and atraumatic.     Right Ear: Tympanic membrane, ear canal and external ear normal.     Left Ear: Tympanic membrane, ear canal and external ear normal.     Nose: No nasal deformity, septal deviation, mucosal edema or rhinorrhea.     Right Turbinates: Enlarged and swollen.     Left Turbinates: Enlarged and swollen.     Right Sinus: No maxillary sinus tenderness or  frontal sinus tenderness.     Left Sinus: No maxillary sinus tenderness or frontal sinus tenderness.     Mouth/Throat:     Mouth: Mucous membranes are not pale and not dry.     Pharynx: Uvula midline.  Eyes:     General: Lids are normal. No allergic shiner.       Right eye: No discharge.        Left eye: No discharge.     Conjunctiva/sclera: Conjunctivae normal.     Right eye: Right conjunctiva is not injected. No chemosis.    Left eye: Left conjunctiva is not injected. No chemosis.    Pupils: Pupils are equal, round, and reactive to light.  Cardiovascular:     Rate and Rhythm: Normal rate and regular rhythm.     Heart sounds: Normal heart sounds.  Pulmonary:     Effort: Pulmonary effort is normal. No tachypnea, accessory muscle usage or respiratory distress.     Breath sounds: Normal breath sounds. No wheezing, rhonchi or rales.  Chest:  Chest wall: No tenderness.  Musculoskeletal:     Right hand: Swelling and deformity present.     Left hand: Swelling and deformity present.  Lymphadenopathy:     Cervical: No cervical adenopathy.  Skin:    Coloration: Skin is not pale.     Findings: No abrasion, erythema, petechiae or rash. Rash is not papular, urticarial or vesicular.  Neurological:     Mental Status: She is alert.  Psychiatric:        Behavior: Behavior is cooperative.     Diagnostic studies:    Spirometry: results normal (FEV1: 1.43/73%, FVC: 1.80/71%, FEV1/FVC: 79%).    Spirometry consistent with normal pattern.   Allergy Studies: none        [name], MD  [facility]

## 2021-05-14 ENCOUNTER — Ambulatory Visit: Payer: Medicare Other

## 2021-05-30 ENCOUNTER — Ambulatory Visit: Payer: Medicare Other | Admitting: Family

## 2021-05-30 ENCOUNTER — Other Ambulatory Visit: Payer: Self-pay

## 2021-05-30 ENCOUNTER — Encounter: Payer: Self-pay | Admitting: Family

## 2021-05-30 VITALS — BP 130/72 | HR 84 | Temp 97.6°F | Resp 20 | Ht 64.0 in | Wt 163.0 lb

## 2021-05-30 DIAGNOSIS — B999 Unspecified infectious disease: Secondary | ICD-10-CM

## 2021-05-30 DIAGNOSIS — J019 Acute sinusitis, unspecified: Secondary | ICD-10-CM

## 2021-05-30 DIAGNOSIS — J4550 Severe persistent asthma, uncomplicated: Secondary | ICD-10-CM

## 2021-05-30 DIAGNOSIS — J4551 Severe persistent asthma with (acute) exacerbation: Secondary | ICD-10-CM | POA: Diagnosis not present

## 2021-05-30 DIAGNOSIS — J310 Chronic rhinitis: Secondary | ICD-10-CM

## 2021-05-30 MED ORDER — AMOXICILLIN-POT CLAVULANATE 875-125 MG PO TABS
1.0000 | ORAL_TABLET | Freq: Two times a day (BID) | ORAL | 0 refills | Status: DC
Start: 2021-05-30 — End: 2021-09-30

## 2021-05-30 NOTE — Progress Notes (Signed)
[name] [place] [address] Dept: [phone]  FOLLOW UP NOTE  Patient ID: Betty Golden, female    DOB: October 03, 1945  Age: 75 y.o. MRN: [id number] Date of Office Visit: 05/30/2021  Assessment  Chief Complaint: Cough (Patient in today for sinus issues and bad cough)  HPI Betty Golden is a 75 year old female who presents today for an acute visit.  She was last seen on April 06, 2021 by Dr. Dellis Golden for severe persistent asthma without complication, chronic nonseasonal allergic rhinitis (last testing performed in [place], recurrent infections, with normal work-up in the past (May 2020), rheumatoid arthritis, fibromyalgia, and recent history of stroke.  Her daughter is here with her today and helps provide history.  She mentions that 2 weeks ago she stopped the Enbrel for rheumatoid arthritis and is homebound due to her rheumatoid arthritis and fibromyalgia.  Chronic nonseasonal allergic rhinitis is reported as not well controlled with fluticasone nasal spray, but she has not used this in the past 4 days because she has felt so bad.  She also takes cetirizine 10 mg once a day.  She has not tried increasing Zyrtec to twice a day when her sinuses are acting up.  She reports for the past 4 days she has had brownish rhinorrhea, nasal congestion, postnasal drip, pain under both eyes, and a headache at the top of her head. She has not had any sinus infections since we last saw her.  When asked how may sinus infections she has had this year she states that  she cannot remember.  Severe persistent asthma is reported as not well controlled with Fasenra every 8 weeks, Arnuity 1 puff once a day, and albuterol as needed.  4 days ago she did increase her Arnuity to 1 puff twice a day.  She reports a productive cough with thick brown-yellow sputum, wheezing, tightness in her chest, shortness of breath, and nocturnal awakenings due to breathing problems.  Since her last  office visit she has not required any systemic steroids or made any trips to the emergency room or urgent care due to breathing problems.  She used her albuterol today at 1:30 PM.  Prior to that she reports that she has used her albuterol maybe 2 times in the past 3 weeks.  She denies any fever, chills, or sick contacts.  She does mention that at times she has hot/ cold.  When asked if she has body aches her daughter mentions that from where she has fibromyalgia and rheumatoid arthritis it is hard for her to differentiate.   Drug Allergies:  Allergies  Allergen Reactions   Other Rash and Other (See Comments)   Sulfa Antibiotics Rash    Review of Systems: Review of Systems  Constitutional:  Negative for chills and fever.  HENT:         Reports postnasal drip, rhinorrhea that is brownish in color, and nasal congestion.  She also reports sinus tenderness underneath both eyes.  Eyes:        Reports watery eyes and denies itchy eyes  Respiratory:  Positive for cough, shortness of breath and wheezing.        Reports productive cough with thick brown-yellow sputum  Cardiovascular:  Positive for chest pain. Negative for palpitations.       Reports pain in her chest from coughing  Gastrointestinal:        Denies heartburn and reflux symptoms  Genitourinary:  Negative for frequency.  Skin:  Negative for  itching and rash.  Neurological:  Positive for headaches.       Reports the top of her head that hurts when she coughs  Endo/Heme/Allergies:  Positive for environmental allergies.    Physical Exam: BP 130/72   Pulse 84   Temp 97.6 F (36.4 C) (Temporal)   Resp 20   Ht 5\' 4"  (1.626 m)   Wt 163 lb (73.9 kg)   SpO2 98%   BMI 27.98 kg/m    Physical Exam Constitutional:      Appearance: Normal appearance.  HENT:     Head: Normocephalic and atraumatic.     Comments: Pharynx, eyes normal, ears normal, nose: Bilateral lower turbinates mildly edematous and slightly erythematous with no  drainage noted    Right Ear: Tympanic membrane, ear canal and external ear normal.     Left Ear: Tympanic membrane, ear canal and external ear normal.     Mouth/Throat:     Mouth: Mucous membranes are moist.     Pharynx: Oropharynx is clear.  Eyes:     Conjunctiva/sclera: Conjunctivae normal.  Cardiovascular:     Rate and Rhythm: Regular rhythm.     Heart sounds: Normal heart sounds.  Pulmonary:     Effort: Pulmonary effort is normal.     Breath sounds: Normal breath sounds.     Comments: Lungs clear to auscultation Musculoskeletal:     Cervical back: Neck supple.  Skin:    General: Skin is warm.  Neurological:     Mental Status: She is alert and oriented to person, place, and time.  Psychiatric:        Mood and Affect: Mood normal.        Behavior: Behavior normal.        Thought Content: Thought content normal.        Judgment: Judgment normal.    Diagnostics: FVC 1.84 L, FEV1 1.46 L. (81%)  Predicted FVC 2.34 L, predicted FEV1 1.81 L.  Spirometry indicates normal respiratory function.  Assessment and Plan: 1. Acute non-recurrent sinusitis, unspecified location   2. Severe persistent asthma with (acute) exacerbation   3. Recurrent infections   4. Chronic rhinitis     Meds ordered this encounter  Medications   amoxicillin-clavulanate (AUGMENTIN) 875-125 MG tablet    Sig: Take 1 tablet by mouth 2 (two) times daily.    Dispense:  14 tablet    Refill:  0     Patient Instructions  1. Chronic nonseasonal allergic rhinitis - Continue with fluticasone two sprays per nostril once daily. - Increase too TWICE DAILY when you feel that your sinuses are acting up.  - Continue with cetirizine (Zyrtec).     2. Moderate persistent asthma, uncomplicated - Daily controller medication(s):  Fasenra every 8 weeks  + Arnuity one puff once daily.  - Prior to physical activity: albuterol 2 puffs 10-15 minutes before physical activity. - Rescue medications: albuterol 4 puffs every 4-6  hours as needed - Changes during respiratory infections or worsening symptoms: Increase Arnuity 1 puff twice daily for TWO WEEKS. - Asthma control goals:  * Full participation in all desired activities (may need albuterol before activity) * Albuterol use two time or less a week on average (not counting use with activity) * Cough interfering with sleep two time or less a month * Oral steroids no more than once a year * No hospitalizations  3.  Acute sinus infection -Start Augmentin 875 mg taking 1 tablet twice a day for 7 days -  Start prednisone 10 mg 1 tablet twice a day for for 4 days, then on the fifth day take 1 tablet and stop -If you continue to have sinus issues we will consider a sinus CT and repeat immune labs.  (Immune labs in May 2020 were normal)  If you continue to have pain in your chest I recommend going to the emergency room. Keep already scheduled follow-up appointment on October 05, 2021 at 1:30 PM with Dr. Dellis Golden If you are concerned about needing oxygen with exertion recommend speaking with her primary care physician or scheduling an appointment with a pulmonologist (lung doctor)  Her oxygen in the office was 98%.  Please let us know if this treatment plan is not working well for you. Return in about 4 months (around 10/05/2021), or if symptoms worsen or fail to improve.    Thank you for the opportunity to care for this patient.  Please do not hesitate to contact me with questions.  Nehemiah Settle, FNP [facility]

## 2021-05-30 NOTE — Patient Instructions (Addendum)
1. Chronic nonseasonal allergic rhinitis - Continue with fluticasone two sprays per nostril once daily. - Increase too TWICE DAILY when you feel that your sinuses are acting up.  - Continue with cetirizine (Zyrtec).     2. Moderate persistent asthma, uncomplicated - Daily controller medication(s):  Fasenra every 8 weeks  + Arnuity one puff once daily.  - Prior to physical activity: albuterol 2 puffs 10-15 minutes before physical activity. - Rescue medications: albuterol 4 puffs every 4-6 hours as needed - Changes during respiratory infections or worsening symptoms: Increase Arnuity 1 puff twice daily for TWO WEEKS. - Asthma control goals:  * Full participation in all desired activities (may need albuterol before activity) * Albuterol use two time or less a week on average (not counting use with activity) * Cough interfering with sleep two time or less a month * Oral steroids no more than once a year * No hospitalizations  3.  Acute sinus infection -Start Augmentin 875 mg taking 1 tablet twice a day for 7 days -Start prednisone 10 mg 1 tablet twice a day for for 4 days, then on the fifth day take 1 tablet and stop -If you continue to have sinus issues we will consider a sinus CT and repeat immune labs.  (Immune labs in May 2020 were normal)  If you continue to have pain in your chest I recommend going to the emergency room. Keep already scheduled follow-up appointment on October 05, 2021 at 1:30 PM with Dr. Dellis Anes If you are concerned about needing oxygen with exertion recommend speaking with her primary care physician or scheduling an appointment with a pulmonologist (lung doctor)  Her oxygen in the office was 98%.  Please let us know if this treatment plan is not working well for you.

## 2021-06-01 ENCOUNTER — Ambulatory Visit: Payer: Medicare Other

## 2021-07-25 ENCOUNTER — Other Ambulatory Visit: Payer: Self-pay

## 2021-07-25 ENCOUNTER — Ambulatory Visit (INDEPENDENT_AMBULATORY_CARE_PROVIDER_SITE_OTHER): Payer: Medicare Other | Admitting: *Deleted

## 2021-07-25 DIAGNOSIS — J4550 Severe persistent asthma, uncomplicated: Secondary | ICD-10-CM

## 2021-09-04 ENCOUNTER — Other Ambulatory Visit: Payer: Self-pay | Admitting: *Deleted

## 2021-09-04 MED ORDER — FASENRA 30 MG/ML ~~LOC~~ SOSY: 30.0000 mg | PREFILLED_SYRINGE | SUBCUTANEOUS | 6 refills | Status: DC

## 2021-09-19 ENCOUNTER — Ambulatory Visit: Payer: Medicare Other

## 2021-09-25 ENCOUNTER — Encounter (HOSPITAL_COMMUNITY): Payer: Self-pay

## 2021-09-25 ENCOUNTER — Other Ambulatory Visit: Payer: Self-pay

## 2021-09-25 ENCOUNTER — Inpatient Hospital Stay (HOSPITAL_COMMUNITY)
Admission: EM | Admit: 2021-09-25 | Discharge: 2021-09-30 | DRG: 208 | Disposition: A | Discharge status: Home Health | Payer: Medicare Other | Attending: Family Medicine | Admitting: Family Medicine

## 2021-09-25 ENCOUNTER — Emergency Department (HOSPITAL_COMMUNITY): Payer: Medicare Other

## 2021-09-25 DIAGNOSIS — G8929 Other chronic pain: Secondary | ICD-10-CM | POA: Diagnosis present

## 2021-09-25 DIAGNOSIS — I129 Hypertensive chronic kidney disease with stage 1 through stage 4 chronic kidney disease, or unspecified chronic kidney disease: Secondary | ICD-10-CM | POA: Diagnosis present

## 2021-09-25 DIAGNOSIS — Z8249 Family history of ischemic heart disease and other diseases of the circulatory system: Secondary | ICD-10-CM

## 2021-09-25 DIAGNOSIS — J9811 Atelectasis: Secondary | ICD-10-CM | POA: Diagnosis present

## 2021-09-25 DIAGNOSIS — Z8619 Personal history of other infectious and parasitic diseases: Secondary | ICD-10-CM

## 2021-09-25 DIAGNOSIS — Z79899 Other long term (current) drug therapy: Secondary | ICD-10-CM

## 2021-09-25 DIAGNOSIS — Z833 Family history of diabetes mellitus: Secondary | ICD-10-CM

## 2021-09-25 DIAGNOSIS — E860 Dehydration: Secondary | ICD-10-CM | POA: Diagnosis present

## 2021-09-25 DIAGNOSIS — Z6824 Body mass index (BMI) 24.0-24.9, adult: Secondary | ICD-10-CM

## 2021-09-25 DIAGNOSIS — R339 Retention of urine, unspecified: Secondary | ICD-10-CM | POA: Diagnosis present

## 2021-09-25 DIAGNOSIS — R0603 Acute respiratory distress: Secondary | ICD-10-CM | POA: Diagnosis not present

## 2021-09-25 DIAGNOSIS — M4802 Spinal stenosis, cervical region: Secondary | ICD-10-CM | POA: Diagnosis present

## 2021-09-25 DIAGNOSIS — Z20822 Contact with and (suspected) exposure to covid-19: Secondary | ICD-10-CM | POA: Diagnosis present

## 2021-09-25 DIAGNOSIS — J9602 Acute respiratory failure with hypercapnia: Secondary | ICD-10-CM | POA: Diagnosis present

## 2021-09-25 DIAGNOSIS — J690 Pneumonitis due to inhalation of food and vomit: Principal | ICD-10-CM | POA: Diagnosis present

## 2021-09-25 DIAGNOSIS — K219 Gastro-esophageal reflux disease without esophagitis: Secondary | ICD-10-CM | POA: Diagnosis present

## 2021-09-25 DIAGNOSIS — R4182 Altered mental status, unspecified: Secondary | ICD-10-CM

## 2021-09-25 DIAGNOSIS — Z8673 Personal history of transient ischemic attack (TIA), and cerebral infarction without residual deficits: Secondary | ICD-10-CM

## 2021-09-25 DIAGNOSIS — E861 Hypovolemia: Secondary | ICD-10-CM | POA: Diagnosis present

## 2021-09-25 DIAGNOSIS — F32A Depression, unspecified: Secondary | ICD-10-CM | POA: Diagnosis present

## 2021-09-25 DIAGNOSIS — N179 Acute kidney failure, unspecified: Secondary | ICD-10-CM | POA: Diagnosis present

## 2021-09-25 DIAGNOSIS — K117 Disturbances of salivary secretion: Secondary | ICD-10-CM | POA: Diagnosis present

## 2021-09-25 DIAGNOSIS — Z978 Presence of other specified devices: Secondary | ICD-10-CM

## 2021-09-25 DIAGNOSIS — J4550 Severe persistent asthma, uncomplicated: Secondary | ICD-10-CM | POA: Diagnosis present

## 2021-09-25 DIAGNOSIS — M069 Rheumatoid arthritis, unspecified: Secondary | ICD-10-CM | POA: Diagnosis present

## 2021-09-25 DIAGNOSIS — B370 Candidal stomatitis: Secondary | ICD-10-CM | POA: Diagnosis present

## 2021-09-25 DIAGNOSIS — E876 Hypokalemia: Secondary | ICD-10-CM | POA: Diagnosis present

## 2021-09-25 DIAGNOSIS — I2510 Atherosclerotic heart disease of native coronary artery without angina pectoris: Secondary | ICD-10-CM | POA: Diagnosis present

## 2021-09-25 DIAGNOSIS — D6489 Other specified anemias: Secondary | ICD-10-CM | POA: Diagnosis present

## 2021-09-25 DIAGNOSIS — T17908A Unspecified foreign body in respiratory tract, part unspecified causing other injury, initial encounter: Secondary | ICD-10-CM | POA: Diagnosis present

## 2021-09-25 DIAGNOSIS — R1310 Dysphagia, unspecified: Secondary | ICD-10-CM | POA: Diagnosis not present

## 2021-09-25 DIAGNOSIS — E785 Hyperlipidemia, unspecified: Secondary | ICD-10-CM | POA: Diagnosis present

## 2021-09-25 DIAGNOSIS — Z87891 Personal history of nicotine dependence: Secondary | ICD-10-CM

## 2021-09-25 DIAGNOSIS — Z7951 Long term (current) use of inhaled steroids: Secondary | ICD-10-CM

## 2021-09-25 DIAGNOSIS — M1990 Unspecified osteoarthritis, unspecified site: Secondary | ICD-10-CM | POA: Diagnosis present

## 2021-09-25 DIAGNOSIS — D72829 Elevated white blood cell count, unspecified: Secondary | ICD-10-CM | POA: Diagnosis present

## 2021-09-25 DIAGNOSIS — N19 Unspecified kidney failure: Secondary | ICD-10-CM

## 2021-09-25 DIAGNOSIS — E440 Moderate protein-calorie malnutrition: Secondary | ICD-10-CM | POA: Diagnosis present

## 2021-09-25 DIAGNOSIS — E871 Hypo-osmolality and hyponatremia: Secondary | ICD-10-CM | POA: Diagnosis present

## 2021-09-25 DIAGNOSIS — Z7982 Long term (current) use of aspirin: Secondary | ICD-10-CM

## 2021-09-25 DIAGNOSIS — R443 Hallucinations, unspecified: Secondary | ICD-10-CM | POA: Diagnosis present

## 2021-09-25 DIAGNOSIS — G9341 Metabolic encephalopathy: Secondary | ICD-10-CM | POA: Diagnosis present

## 2021-09-25 DIAGNOSIS — J8283 Eosinophilic asthma: Secondary | ICD-10-CM | POA: Diagnosis present

## 2021-09-25 DIAGNOSIS — N183 Chronic kidney disease, stage 3 unspecified: Secondary | ICD-10-CM | POA: Diagnosis present

## 2021-09-25 DIAGNOSIS — Z823 Family history of stroke: Secondary | ICD-10-CM

## 2021-09-25 DIAGNOSIS — M797 Fibromyalgia: Secondary | ICD-10-CM | POA: Diagnosis present

## 2021-09-25 DIAGNOSIS — I10 Essential (primary) hypertension: Secondary | ICD-10-CM | POA: Diagnosis present

## 2021-09-25 DIAGNOSIS — Z882 Allergy status to sulfonamides status: Secondary | ICD-10-CM

## 2021-09-25 DIAGNOSIS — R338 Other retention of urine: Secondary | ICD-10-CM | POA: Diagnosis present

## 2021-09-25 DIAGNOSIS — Z9049 Acquired absence of other specified parts of digestive tract: Secondary | ICD-10-CM

## 2021-09-25 DIAGNOSIS — J449 Chronic obstructive pulmonary disease, unspecified: Secondary | ICD-10-CM | POA: Diagnosis present

## 2021-09-25 DIAGNOSIS — J9690 Respiratory failure, unspecified, unspecified whether with hypoxia or hypercapnia: Secondary | ICD-10-CM

## 2021-09-25 DIAGNOSIS — J9601 Acute respiratory failure with hypoxia: Secondary | ICD-10-CM | POA: Diagnosis present

## 2021-09-25 DIAGNOSIS — Z825 Family history of asthma and other chronic lower respiratory diseases: Secondary | ICD-10-CM

## 2021-09-25 DIAGNOSIS — Z9181 History of falling: Secondary | ICD-10-CM

## 2021-09-25 LAB — CBC WITH DIFFERENTIAL/PLATELET
Abs Immature Granulocytes: 0.15 10*3/uL — ABNORMAL HIGH (ref 0.00–0.07)
Basophils Absolute: 0.1 10*3/uL (ref 0.0–0.1)
Basophils Relative: 0 %
Eosinophils Absolute: 0 10*3/uL (ref 0.0–0.5)
Eosinophils Relative: 0 %
HCT: 36 % (ref 36.0–46.0)
Hemoglobin: 12 g/dL (ref 12.0–15.0)
Immature Granulocytes: 1 %
Lymphocytes Relative: 8 %
Lymphs Abs: 1.7 10*3/uL (ref 0.7–4.0)
MCH: 30.4 pg (ref 26.0–34.0)
MCHC: 33.3 g/dL (ref 30.0–36.0)
MCV: 91.1 fL (ref 80.0–100.0)
Monocytes Absolute: 2 10*3/uL — ABNORMAL HIGH (ref 0.1–1.0)
Monocytes Relative: 9 %
Neutro Abs: 18.6 10*3/uL — ABNORMAL HIGH (ref 1.7–7.7)
Neutrophils Relative %: 82 %
Platelets: 263 10*3/uL (ref 150–400)
RBC: 3.95 MIL/uL (ref 3.87–5.11)
RDW: 15 % (ref 11.5–15.5)
WBC: 22.5 10*3/uL — ABNORMAL HIGH (ref 4.0–10.5)
nRBC: 0 % (ref 0.0–0.2)

## 2021-09-25 LAB — BASIC METABOLIC PANEL
Anion gap: 16 — ABNORMAL HIGH (ref 5–15)
Anion gap: 15 (ref 5–15)
BUN: 72 mg/dL — ABNORMAL HIGH (ref 8–23)
BUN: 70 mg/dL — ABNORMAL HIGH (ref 8–23)
CO2: 21 mmol/L — ABNORMAL LOW (ref 22–32)
CO2: 18 mmol/L — ABNORMAL LOW (ref 22–32)
Calcium: 9.1 mg/dL (ref 8.9–10.3)
Calcium: 8.9 mg/dL (ref 8.9–10.3)
Chloride: 96 mmol/L — ABNORMAL LOW (ref 98–111)
Chloride: 101 mmol/L (ref 98–111)
Creatinine, Ser: 3.93 mg/dL — ABNORMAL HIGH (ref 0.44–1.00)
Creatinine, Ser: 3.48 mg/dL — ABNORMAL HIGH (ref 0.44–1.00)
GFR, Estimated: 11 mL/min — ABNORMAL LOW (ref >60)
GFR, Estimated: 13 mL/min — ABNORMAL LOW (ref >60)
Glucose, Bld: 115 mg/dL — ABNORMAL HIGH (ref 70–99)
Glucose, Bld: 100 mg/dL — ABNORMAL HIGH (ref 70–99)
Potassium: 3.1 mmol/L — ABNORMAL LOW (ref 3.5–5.1)
Potassium: 4 mmol/L (ref 3.5–5.1)
Sodium: 133 mmol/L — ABNORMAL LOW (ref 135–145)
Sodium: 134 mmol/L — ABNORMAL LOW (ref 135–145)

## 2021-09-25 LAB — HEPATIC FUNCTION PANEL
ALT: 68 U/L — ABNORMAL HIGH (ref 0–44)
AST: 135 U/L — ABNORMAL HIGH (ref 15–41)
Albumin: 3.1 g/dL — ABNORMAL LOW (ref 3.5–5.0)
Alkaline Phosphatase: 72 U/L (ref 38–126)
Bilirubin, Direct: 0.1 mg/dL (ref 0.0–0.2)
Indirect Bilirubin: 0.7 mg/dL (ref 0.3–0.9)
Total Bilirubin: 0.8 mg/dL (ref 0.3–1.2)
Total Protein: 8.4 g/dL — ABNORMAL HIGH (ref 6.5–8.1)

## 2021-09-25 LAB — CBC
HCT: 35.1 % — ABNORMAL LOW (ref 36.0–46.0)
Hemoglobin: 11.7 g/dL — ABNORMAL LOW (ref 12.0–15.0)
MCH: 30.4 pg (ref 26.0–34.0)
MCHC: 33.3 g/dL (ref 30.0–36.0)
MCV: 91.2 fL (ref 80.0–100.0)
Platelets: 285 10*3/uL (ref 150–400)
RBC: 3.85 MIL/uL — ABNORMAL LOW (ref 3.87–5.11)
RDW: 15 % (ref 11.5–15.5)
WBC: 20.8 10*3/uL — ABNORMAL HIGH (ref 4.0–10.5)
nRBC: 0 % (ref 0.0–0.2)

## 2021-09-25 LAB — CK: Total CK: 51 U/L (ref 38–234)

## 2021-09-25 LAB — OSMOLALITY: Osmolality: 308 mOsm/kg — ABNORMAL HIGH (ref 275–295)

## 2021-09-25 LAB — RESP PANEL BY RT-PCR (FLU A&B, COVID) ARPGX2
Influenza A by PCR: NEGATIVE
Influenza B by PCR: NEGATIVE
SARS Coronavirus 2 by RT PCR: NEGATIVE

## 2021-09-25 MED ORDER — FOLIC ACID 1 MG PO TABS
1.0000 mg | ORAL_TABLET | Freq: Every day | ORAL | Status: DC
Start: 2021-09-26 — End: 2021-09-26
  Filled 2021-09-25: qty 1

## 2021-09-25 MED ORDER — DULOXETINE HCL 30 MG PO CPEP
30.0000 mg | ORAL_CAPSULE | Freq: Two times a day (BID) | ORAL | Status: DC
  Administered 2021-09-25: 30 mg via ORAL
  Filled 2021-09-25 (×2): qty 1

## 2021-09-25 MED ORDER — HYDROCHLOROTHIAZIDE 12.5 MG PO TABS
12.5000 mg | ORAL_TABLET | Freq: Every day | ORAL | Status: DC
  Filled 2021-09-25: qty 1

## 2021-09-25 MED ORDER — MONTELUKAST SODIUM 10 MG PO TABS
10.0000 mg | ORAL_TABLET | Freq: Every day | ORAL | Status: DC
Start: 2021-09-25 — End: 2021-09-26
  Administered 2021-09-25: 10 mg via ORAL
  Filled 2021-09-25: qty 1

## 2021-09-25 MED ORDER — SIMVASTATIN 20 MG PO TABS
20.0000 mg | ORAL_TABLET | Freq: Every day | ORAL | Status: DC
  Administered 2021-09-25: 20 mg via ORAL
  Filled 2021-09-25: qty 2

## 2021-09-25 MED ORDER — FLUTICASONE PROPIONATE 50 MCG/ACT NA SUSP
2.0000 | Freq: Every day | NASAL | Status: DC
  Administered 2021-09-27 – 2021-09-30 (×4): 2 via NASAL
  Filled 2021-09-25 (×3): qty 16

## 2021-09-25 MED ORDER — LOSARTAN POTASSIUM-HCTZ 100-12.5 MG PO TABS: 1.0000 | ORAL_TABLET | Freq: Every day | ORAL | Status: DC

## 2021-09-25 MED ORDER — FLUTICASONE FUROATE 200 MCG/ACT IN AEPB: 250.0000 ug | INHALATION_SPRAY | Freq: Every day | RESPIRATORY_TRACT | Status: DC

## 2021-09-25 MED ORDER — PREGABALIN 25 MG PO CAPS
50.0000 mg | ORAL_CAPSULE | Freq: Two times a day (BID) | ORAL | Status: DC
  Administered 2021-09-25: 50 mg via ORAL
  Filled 2021-09-25 (×2): qty 1

## 2021-09-25 MED ORDER — HYDROMORPHONE HCL 1 MG/ML IJ SOLN
0.5000 mg | INTRAMUSCULAR | Status: DC | PRN
  Administered 2021-09-26: 1 mg via INTRAVENOUS
  Filled 2021-09-25: qty 1

## 2021-09-25 MED ORDER — ALBUTEROL SULFATE HFA 108 (90 BASE) MCG/ACT IN AERS: 2.0000 | INHALATION_SPRAY | RESPIRATORY_TRACT | Status: DC | PRN

## 2021-09-25 MED ORDER — PANTOPRAZOLE SODIUM 40 MG PO TBEC
40.0000 mg | DELAYED_RELEASE_TABLET | Freq: Every day | ORAL | Status: DC
  Filled 2021-09-25: qty 1

## 2021-09-25 MED ORDER — ASPIRIN 81 MG PO CHEW
81.0000 mg | CHEWABLE_TABLET | Freq: Every day | ORAL | Status: DC
  Administered 2021-09-25: 81 mg via ORAL
  Filled 2021-09-25 (×2): qty 1

## 2021-09-25 MED ORDER — POTASSIUM CHLORIDE IN NACL 20-0.9 MEQ/L-% IV SOLN
INTRAVENOUS | Status: AC
  Filled 2021-09-25: qty 1000

## 2021-09-25 MED ORDER — HEPARIN SODIUM (PORCINE) 5000 UNIT/ML IJ SOLN
5000.0000 [IU] | Freq: Three times a day (TID) | INTRAMUSCULAR | Status: DC
  Administered 2021-09-26 – 2021-09-30 (×14): 5000 [IU] via SUBCUTANEOUS
  Filled 2021-09-25 (×15): qty 1

## 2021-09-25 MED ORDER — DOCUSATE SODIUM 100 MG PO CAPS
100.0000 mg | ORAL_CAPSULE | Freq: Two times a day (BID) | ORAL | Status: DC
  Administered 2021-09-25: 100 mg via ORAL
  Filled 2021-09-25 (×2): qty 1

## 2021-09-25 MED ORDER — LACTATED RINGERS IV BOLUS
2000.0000 mL | Freq: Once | INTRAVENOUS | Status: AC
  Administered 2021-09-25: 2000 mL via INTRAVENOUS

## 2021-09-25 MED ORDER — METHOTREXATE 2.5 MG PO TABS: 10.0000 mg | ORAL_TABLET | ORAL | Status: DC

## 2021-09-25 MED ORDER — BUDESONIDE 0.25 MG/2ML IN SUSP
0.2500 mg | Freq: Two times a day (BID) | RESPIRATORY_TRACT | Status: DC
  Administered 2021-09-26: 0.25 mg via RESPIRATORY_TRACT
  Filled 2021-09-25: qty 2

## 2021-09-25 MED ORDER — LOSARTAN POTASSIUM 50 MG PO TABS
100.0000 mg | ORAL_TABLET | Freq: Every day | ORAL | Status: DC
  Filled 2021-09-25: qty 2

## 2021-09-25 MED ORDER — METHOTREXATE 2.5 MG PO TABS
7.5000 mg | ORAL_TABLET | ORAL | Status: DC
Start: 2021-09-26 — End: 2021-09-25

## 2021-09-25 NOTE — Assessment & Plan Note (Addendum)
Resume home meds, added amlodipine 5 mg for additional BP control.  Follow up with PCP recommended.

## 2021-09-25 NOTE — Assessment & Plan Note (Addendum)
Bronchodilators as ordered Pulmonary consultation

## 2021-09-25 NOTE — ED Provider Notes (Signed)
°  Provider Note MRN:  [id number]  Arrival date & time: 09/25/21    ED Course and Medical Decision Making  Assumed care from [place] at shift change.  See not from prior team for complete details, in brief: 76 year old female present to the ER secondary to fall.  Pain was concerned that she was having a "fibromyalgia flareup" for around 2 weeks.  Poor p.o.  Less activity.  Fall yesterday with head injury.  Intermittent confusion, hallucinations.  Neuro exam is nonfocal in the ER, ANO x3.  Patient found to be in acute renal failure with creatinine 3.93.  She has uremia w/ BUN at 72.  Urinalysis pending.  Favor likely prerenal given no evidence of obstructive uropathy on CT imaging.  Poor p.o. intake over the past 2 weeks. CT non-acute/.   IV rehydration started  Plan for admission, she is HDS. Agreeable for admission.       .Critical Care Performed by: Jeanell Sparrow, DO Authorized by: Jeanell Sparrow, DO   Critical care provider statement:    Critical care time (minutes):  30   Critical care time was exclusive of:  Separately billable procedures and treating other patients   Critical care was necessary to treat or prevent imminent or life-threatening deterioration of the following conditions:  Renal failure   Critical care was time spent personally by me on the following activities:  Development of treatment plan with patient or surrogate, discussions with consultants, evaluation of patient's response to treatment, examination of patient, ordering and review of laboratory studies, ordering and review of radiographic studies, ordering and performing treatments and interventions, pulse oximetry, re-evaluation of patient's condition and review of old charts   Care discussed with: admitting provider    Final Clinical Impressions(s) / ED Diagnoses     ICD-10-CM   1. AKI (acute kidney injury) ([place])  N17.9     2. Uremia  N19     3. Altered mental status, unspecified altered mental status  type  R41.82       ED Discharge Orders     None       Discharge Instructions   None        Jeanell Sparrow, DO 09/25/21 1724

## 2021-09-25 NOTE — Subjective & Objective (Signed)
76 y/o patient has history of fibromyalgia, hypertension, COPD, CKD, RA. She is followed by Dr. Allene Pyo at [facility] and has frequent visit for medication monitoring. Per daughter patient had her fibromyalgia and arthritis flareup about 2 weeks ago.  Since then patient has not been eating or drinking well.  She has also been more sedentary.  Yesterday patient had a fall, and she struck the back of her head.  Now they are noticing that patient is more confused and often hallucinating, seeing things that do not exist in the room. For these problems she presents to [facility] for evaluation.

## 2021-09-25 NOTE — Assessment & Plan Note (Addendum)
Smear did not show abnormal morphology.  Procalcitonin trending down.  Recheck CBC with PCP in 1 week If persistent would recommend referral to hematologist.

## 2021-09-25 NOTE — ED Notes (Signed)
Pt placed on bedpan at this time. Family instructed on use of call light for help removing it.

## 2021-09-25 NOTE — H&P (Signed)
History and Physical    Betty Golden [id number] DOB: 02-04-1946 DOA: 09/25/2021  DOS: the patient was seen and examined on 09/25/2021  PCP: Normand Sloop, Betty Golden  copy to Dr. Kathlene November  Patient coming from: Home  I have personally briefly reviewed patient's old medical records in [place]  76 y/o patient has history of fibromyalgia, hypertension, COPD, CKD, RA. She is followed by Dr. Georgette Dover at [facility] and has frequent visit for medication monitoring. Per daughter patient had her fibromyalgia and arthritis flareup about 2 weeks ago.  Since then patient has not been eating or drinking well.  She has also been more sedentary.  Yesterday patient had a fall, and she struck the back of her head.  Now they are noticing that patient is more confused and often hallucinating, seeing things that do not exist in the room. For these problems she presents to [facility] for evaluation.    ED Course: T 98.4, 142/64  96  RR 15 BMI 25. Na 133, K 3.1 BUN 72,m CR 3.93 (last 1.25). Alb 8.1, AST 135, ALT 68, WBC 20.8 diff pending, Hgb 11.7. CT c-spine w/o acute change, CT head - old lacunar R posterior corona radiata, second old lacunar injury no acute changes. CT Ab/Pel no acute changes, right posterior subsegmental atelectasis. In ED patient received IVF. TRH called to admit for pre-renal azotemia and metabolic derangement 2/2 dehydration  Review of Systems:  Review of Systems  Constitutional:  Positive for malaise/fatigue. Negative for chills, diaphoresis, fever and weight loss.  HENT: Negative.    Eyes: Negative.   Respiratory:  Positive for cough. Negative for sputum production, shortness of breath and wheezing.   Cardiovascular:  Negative for chest pain, palpitations and leg swelling.  Gastrointestinal:  Positive for nausea. Negative for abdominal pain and diarrhea.  Genitourinary:  Negative for dysuria, flank pain, frequency and urgency.  Musculoskeletal:  Positive for back pain, falls, joint pain  and myalgias.       Patient reports chronic balance issues worse over the past two weeks. She has required assistance with transfers due to weakness. Today when getting up on her own she fell.   Skin: Negative.   Neurological:  Positive for speech change. Negative for dizziness and headaches.       Thickened speech in setting of xerostomia  Psychiatric/Behavioral:  Positive for hallucinations. Negative for depression. The patient is not nervous/anxious.    Past Medical History:  Diagnosis Date   Arthritis    Asthma    C. difficile colitis    Cervical stenosis of spine    Chronic headaches    Chronic neck pain    Coronary artery disease    Depression    Diverticulosis    Essential hypertension    Fibromyalgia    GERD (gastroesophageal reflux disease)    History of Salmonella gastroenteritis    HNP (herniated nucleus pulposus), lumbar    Hyperlipidemia    IBS (irritable bowel syndrome)    Lung nodule     Past Surgical History:  Procedure Laterality Date   APPENDECTOMY  1965   BIOPSY  08/26/2018   Procedure: BIOPSY;  Surgeon: Rogene Houston, Betty Golden;  [facility];  Service: Endoscopy;;  gastric   BREAST BIOPSY     BREAST [place]   COLONOSCOPY N/A 08/19/2013   Procedure: COLONOSCOPY;  Surgeon: Rogene Houston, Betty Golden;  [facility];  Service: Endoscopy;  Laterality: N/A;  155-moved to 140 Ann to notify pt   COLONOSCOPY N/A 08/26/2018   Procedure: COLONOSCOPY;  Surgeon: Rogene Houston, Betty Golden;  [facility];  Service: Endoscopy;  Laterality: N/A;   ESOPHAGOGASTRODUODENOSCOPY N/A 08/26/2018   Procedure: ESOPHAGOGASTRODUODENOSCOPY (EGD);  Surgeon: Rogene Houston, Betty Golden;  Location: [facility];  Service: Endoscopy;  Laterality: N/A;   fibromyalgia     LUMBAR LAMINECTOMY/DECOMPRESSION MICRODISCECTOMY Bilateral 09/20/2015   Procedure: MICRO LUMBAR DECOMPRESSION L5-S1 BILATERAL    (1 LEVEL);  Surgeon: Susa Day, Betty Golden;   Location: [facility];  Service: Orthopedics;  Laterality: Bilateral;   rheumatoid arthritis     Sinus Surgery     TONSILLECTOMY  1968    Soc Hx - never married. Has one daughter, two grandsons. She lives with her daughter. 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    reports that she quit smoking about 33 years ago. Her smoking use included cigarettes. She has a 20.00 pack-year smoking history. She has never used smokeless tobacco. She reports that she does not drink alcohol and does not use drugs.  Allergies  Allergen Reactions   Other Rash and Other (See Comments)   Sulfa Antibiotics Rash    Family History  Problem Relation Age of Onset   Heart disease Mother    Hypertension Mother    Asthma Mother    Heart disease Father    Stroke Father    Hypertension Father    Stroke Sister    Asthma Sister    Hypertension Brother    Asthma Brother    Asthma Maternal Grandmother    Heart disease Maternal Grandmother    Diabetes Maternal Grandfather    Asthma Maternal Aunt    Colon cancer Neg Hx     Prior to Admission medications   Medication Sig Start Date End Date Taking? Authorizing Provider  albuterol (PROAIR HFA) 108 (90 Base) MCG/ACT inhaler Inhale 2 puffs into the lungs every 4 (four) hours as needed for wheezing or shortness of breath. 04/06/21  Yes Valentina Shaggy, Betty Golden  aspirin 81 MG chewable tablet Chew 1 tablet (81 mg total) by mouth daily. 04/22/19  Yes Hongalgi, Lenis Dickinson, Betty Golden  Benralizumab (FASENRA) 30 MG/ML SOSY Inject 1 mL (30 mg total) into the skin every 8 (eight) weeks. 09/04/21  Yes Valentina Shaggy, Betty Golden  DULoxetine (CYMBALTA) 30 MG capsule Take 30 mg by mouth 2 (two) times daily.   Yes Provider, Historical, Betty Golden  fluticasone (FLONASE) 50 MCG/ACT nasal spray SPRAY 2 SPRAYS INTO EACH NOSTRIL EVERY DAY 04/06/21  Yes Valentina Shaggy, Betty Golden  Fluticasone Furoate (ARNUITY ELLIPTA) 200 MCG/ACT AEPB TAKE 1 PUFF BY MOUTH EVERY DAY 04/06/21  Yes Valentina Shaggy, Betty Golden  folic acid  (FOLVITE) 1 MG tablet Take 1 mg by mouth daily. 09/08/19  Yes Provider, Historical, Betty Golden  losartan-hydrochlorothiazide (HYZAAR) 100-12.5 MG tablet Take 1 tablet by mouth daily. 09/09/21  Yes Provider, Historical, Betty Golden  methotrexate (RHEUMATREX) 2.5 MG tablet SMARTSIG:3 Tablet(s) By Mouth Once a Week 08/19/19  Yes Provider, Historical, Betty Golden  montelukast (SINGULAIR) 10 MG tablet Take 10 mg by mouth at bedtime. 12/07/19  Yes Provider, Historical, Betty Golden  pregabalin (LYRICA) 50 MG capsule Take 50 mg by mouth 2 (two) times daily. 09/19/21  Yes Provider, Historical, Betty Golden  simvastatin (ZOCOR) 20 MG tablet Take 20 mg by mouth at bedtime.    Yes Provider, Historical, Betty Golden  amoxicillin-clavulanate (AUGMENTIN) 875-125 MG tablet Take 1 tablet by mouth 2 (two) times daily. Patient not taking:  Reported on 09/25/2021 05/30/21   Althea Charon, FNP  ENBREL SURECLICK 50 MG/ML injection Inject into the skin once a week. 02/29/20   Provider, Historical, Betty Golden  pantoprazole (PROTONIX) 40 MG tablet Take 1 tablet (40 mg total) by mouth daily. Patient not taking: Reported on 09/25/2021 04/06/19   Thurnell Lose, Betty Golden    Physical Exam: Vitals:   09/25/21 1430 09/25/21 1630 09/25/21 1730 09/25/21 1830  BP: 133/84 (!) 149/67 (!) 142/64 140/80  Pulse: (!) 107 94 96 (!) 101  Resp: 19 19 15 20   Temp:      TempSrc:      SpO2: 97% 97% 95% 96%  Weight:      Height:        Physical Exam Vitals reviewed.  Constitutional:      General: She is not in acute distress.    Appearance: She is ill-appearing.  HENT:     Head: Normocephalic and atraumatic.     Nose: Nose normal.     Mouth/Throat:     Mouth: Mucous membranes are dry.     Comments: edentulous Eyes:     Conjunctiva/sclera: Conjunctivae normal.     Pupils: Pupils are equal, round, and reactive to light.  Cardiovascular:     Rate and Rhythm: Normal rate and regular rhythm.  Pulmonary:     Effort: Pulmonary effort is normal.     Breath sounds: Normal breath sounds.  Abdominal:      General: Bowel sounds are normal.     Palpations: Abdomen is soft.     Comments: Protuberant abdomen  Musculoskeletal:        General: Normal range of motion.     Cervical back: Normal range of motion and neck supple.     Comments: No joint abnomrality: no swelling, erythema, deformity  Skin:    General: Skin is warm and dry.  Neurological:     General: No focal deficit present.     Mental Status: She is alert and oriented to person, place, and time. Mental status is at baseline.  Psychiatric:        Mood and Affect: Mood normal.        Thought Content: Thought content normal.        Judgment: Judgment normal.     Labs on Admission: I have personally reviewed following labs and imaging studies  CBC: Recent Labs  Lab 09/25/21 1340  WBC 20.8*  HGB 11.7*  HCT 35.1*  MCV 91.2  PLT [id number]   Basic Metabolic Panel: Recent Labs  Lab 09/25/21 1340  NA 133*  K 3.1*  CL 96*  CO2 21*  GLUCOSE 115*  BUN 72*  CREATININE 3.93*  CALCIUM 9.1   GFR: Estimated Creatinine Clearance: 11.7 mL/min (A) (by C-G formula based on SCr of 3.93 mg/dL (H)). Liver Function Tests: Recent Labs  Lab 09/25/21 1340  AST 135*  ALT 68*  ALKPHOS 72  BILITOT 0.8  PROT 8.4*  ALBUMIN 3.1*   No results for input(s): LIPASE, AMYLASE in the last 168 hours. No results for input(s): AMMONIA in the last 168 hours. Coagulation Profile: No results for input(s): INR, PROTIME in the last 168 hours. Cardiac Enzymes: Recent Labs  Lab 09/25/21 1340  CKTOTAL 51   BNP (last 3 results) No results for input(s): PROBNP in the last 8760 hours. HbA1C: No results for input(s): HGBA1C in the last 72 hours. CBG: No results for input(s): GLUCAP in the last 168 hours. Lipid Profile: No results for input(s):  CHOL, HDL, LDLCALC, TRIG, CHOLHDL, LDLDIRECT in the last 72 hours. Thyroid Function Tests: No results for input(s): TSH, T4TOTAL, FREET4, T3FREE, THYROIDAB in the last 72 hours. Anemia Panel: No results  for input(s): VITAMINB12, FOLATE, FERRITIN, TIBC, IRON, RETICCTPCT in the last 72 hours. Urine analysis:    Component Value Date/Time   COLORURINE YELLOW 04/18/2019 1814   APPEARANCEUR HAZY (A) 04/18/2019 1814   LABSPEC 1.038 (H) 04/18/2019 1814   PHURINE 5.0 04/18/2019 1814   GLUCOSEU NEGATIVE 04/18/2019 1814   HGBUR NEGATIVE 04/18/2019 1814   BILIRUBINUR NEGATIVE 04/18/2019 1814   KETONESUR NEGATIVE 04/18/2019 1814   PROTEINUR 30 (A) 04/18/2019 1814   UROBILINOGEN 0.2 03/09/2015 1035   NITRITE NEGATIVE 04/18/2019 1814   LEUKOCYTESUR SMALL (A) 04/18/2019 1814    Radiological Exams on Admission: I have personally reviewed images CT Head Wo Contrast  Result Date: 09/25/2021 CLINICAL DATA:  Head trauma minor. Fell backwards yesterday hitting head. Headache. EXAM: CT HEAD WITHOUT CONTRAST TECHNIQUE: Contiguous axial images were obtained from the base of the skull through the vertex without intravenous contrast. RADIATION DOSE REDUCTION: This exam was performed according to the departmental dose-optimization program which includes automated exposure control, adjustment of the mA and/or kV according to patient size and/or use of iterative reconstruction technique. COMPARISON:  CT brain 04/16/2019 FINDINGS: Brain: The ventricles are normal in size and configuration. The basilar cisterns are patent. No mass, mass effect, or midline shift. Unchanged focal hypodensity likely lacunar infarct within the right posterior frontal corona radiata white matter (axial image 20). Additional chronic lacunar infarct is again seen within the anterior limb of the left internal capsule. No acute intracranial hemorrhage is seen. No abnormal extra-axial fluid collection. Preservation of the normal cortical gray-white interface without CT evidence of an acute major vascular territorial cortical based infarction. Vascular: No hyperdense vessel or unexpected calcification. Skull: Normal. Negative for fracture or focal  lesion. Sinuses/Orbits: The visualized orbits are unremarkable. The visualized paranasal sinuses and mastoid air cells are clear. Bilateral maxillary sinus antrostomy postsurgical changes are again seen. Other: None. IMPRESSION:: IMPRESSION: 1. No significant change from prior. 2. No acute intracranial process. 3. Mild chronic lacunar infarcts/small-vessel ischemic disease. Electronically Signed   By: Yvonne Kendall M.D.   On: 09/25/2021 16:26   CT Cervical Spine Wo Contrast  Result Date: 09/25/2021 CLINICAL DATA:  Fall. EXAM: CT CERVICAL SPINE WITHOUT CONTRAST TECHNIQUE: Multidetector CT imaging of the cervical spine was performed without intravenous contrast. Multiplanar CT image reconstructions were also generated. RADIATION DOSE REDUCTION: This exam was performed according to the departmental dose-optimization program which includes automated exposure control, adjustment of the mA and/or kV according to patient size and/or use of iterative reconstruction technique. COMPARISON:  None. FINDINGS: Alignment: Normal. Skull base and vertebrae: No acute fracture. No primary bone lesion or focal pathologic process. Soft tissues and spinal canal: No prevertebral fluid or swelling. No visible canal hematoma. Disc levels: Extensive anterior osteophyte formation is noted at all levels of the cervical spine. Upper chest: Negative. Other: None. IMPRESSION: Extensive degenerative changes as described above. No acute abnormality seen. Electronically Signed   By: Marijo Conception M.D.   On: 09/25/2021 16:46   CT CHEST ABDOMEN PELVIS WO CONTRAST  Result Date: 09/25/2021 CLINICAL DATA:  Back pain after fall. EXAM: CT CHEST, ABDOMEN AND PELVIS WITHOUT CONTRAST TECHNIQUE: Multidetector CT imaging of the chest, abdomen and pelvis was performed following the standard protocol without IV contrast. RADIATION DOSE REDUCTION: This exam was performed according to the departmental  dose-optimization program which includes automated  exposure control, adjustment of the mA and/or kV according to patient size and/or use of iterative reconstruction technique. COMPARISON:  April 07, 2019. FINDINGS: CT CHEST FINDINGS Cardiovascular: Atherosclerosis of thoracic aorta is noted without aneurysm formation. Normal cardiac size. No pericardial effusion. Mediastinum/Nodes: No enlarged mediastinal, hilar, or axillary lymph nodes. Thyroid gland, trachea, and esophagus demonstrate no significant findings. Lungs/Pleura: No pneumothorax or pleural effusion is noted. Minimal right posterior basilar subsegmental atelectasis or scarring is noted. Probable scarring is noted laterally in the right upper lobe. No definite acute abnormality is noted in left lung. Musculoskeletal: No chest wall mass or suspicious bone lesions identified. CT ABDOMEN PELVIS FINDINGS Hepatobiliary: No focal liver abnormality is seen. Status post cholecystectomy. No biliary dilatation. Pancreas: Unremarkable. No pancreatic ductal dilatation or surrounding inflammatory changes. Spleen: Normal in size without focal abnormality. Adrenals/Urinary Tract: Adrenal glands are unremarkable. Kidneys are normal, without renal calculi, focal lesion, or hydronephrosis. Bladder is unremarkable. Stomach/Bowel: The stomach appears normal. There is no evidence of bowel obstruction or inflammation. Sigmoid diverticulosis is noted. Status post appendectomy. Vascular/Lymphatic: Aortic atherosclerosis. No enlarged abdominal or pelvic lymph nodes. Reproductive: Uterus and bilateral adnexa are unremarkable. Other: No abdominal wall hernia or abnormality. No abdominopelvic ascites. Musculoskeletal: No acute or significant osseous findings. IMPRESSION: No evidence of traumatic injury seen in the chest, abdomen or pelvis. Minimal right posterior basilar subsegmental atelectasis or scarring is noted. Probable scarring seen in right upper lobe. Sigmoid diverticulosis without inflammation. Aortic Atherosclerosis  (ICD10-I70.0). Electronically Signed   By: Marijo Conception M.D.   On: 09/25/2021 16:30    EKG: I have personally reviewed EKG: Sinus tachycardia, RAE, old inferior,anterlateral injury pattern, no acute changes.   Assessment/Plan Principal Problem:   AKI (acute kidney injury) ([place]) Active Problems:   Acute renal failure (HCC)   Dehydration   RA (rheumatoid arthritis) (HCC)   Leukocytosis   Essential hypertension   COPD (chronic obstructive pulmonary disease) (HCC)   Fibromyalgia    Assessment and Plan: * AKI (acute kidney injury) ([place])- (present on admission) H/o CKD 3. Most recent lab with Cr 1.25, now with Cr 3.93. Patient has had poor PO intake and appears dehydrated wit xerostomia. Metabolic derangement noted including elevated LFTs suspect related to dehydration. Patient did receive bolus IVF in ED  Plan IV hydration- NS w/ 20 meq K @ 100 cc/hr, reduce to 75 after 12 hrs  F/u lab in AM: Cmet, CBC   Leukocytosis- (present on admission) Patient with leukocytosis without evidence of infection: no fever, rigors, dysuria, productive cough. Imaging studies negative. Not on steroids.  Plan With no evidence of focal infection by lab or exam will hold Abx  For fever, rigors or other evidence of infection will require broad spectrum abx in setting of immunotherapy  RA (rheumatoid arthritis) ([place])- (present on admission) Followed closely by Dr Aryal-rhematology on aggressive immunotherapy.  Plan Continue home regimen  Fibromyalgia- (present on admission) Followed by Rhematology. Patient has had increased symptoms but no focal findings.  Plan Continue home regimen.   COPD (chronic obstructive pulmonary disease) ([place])- (present on admission) Stable with clear lungs.  Plan Continue home regimen  Essential hypertension- (present on admission) BP stable. Continue home meds including ARB. If AKI persists will hold ARB       DVT prophylaxis: SQ Heparin Code Status: Full  Code Family Communication: daughter present during interview and exam. Understands DX workup and Tx plan. Answered all questions  Disposition Plan: home when stable -  24-48 hrs  Consults called: none  Admission status: Observation, Med-Surg   Betty Hare, Betty Golden [facility] 09/25/2021, 7:33 PM

## 2021-09-25 NOTE — Assessment & Plan Note (Signed)
H/o CKD 3. Most recent lab with Cr 1.25, now with Cr 3.93. Patient has had poor PO intake and appears dehydrated wit xerostomia. Metabolic derangement noted including elevated LFTs suspect related to dehydration. Patient did receive bolus IVF in ED  Plan IV hydration- NS w/ 20 meq K @ 100 cc/hr, reduce to 75 after 12 hrs  F/u lab in AM: Cmet, CBC

## 2021-09-25 NOTE — ED Triage Notes (Addendum)
Patient had a fall at home did hit head with complaints of head, neck, and back pain. No LOC. Daughter states she has had increased weakness for 2 weeks and dizziness that started yesterday.

## 2021-09-25 NOTE — ED Provider Notes (Addendum)
[facility] EMERGENCY DEPARTMENT Provider Note   CSN: [id number] Arrival date & time: 09/25/21  1325     History  Chief Complaint  Patient presents with   Betty Golden    Betty Golden is a 77 y.o. female.  HPI    76 year old female comes in with chief complaint of fall.  Patient accompanied by her daughter.  Patient has history of fibromyalgia, hypertension, COPD, CKD and per daughter patient had her fibromyalgia and arthritis flareup about 2 weeks ago.  Since then patient has not been eating or drinking well.  She has also been more sedentary.  Yesterday patient had a fall, and she struck the back of her head.  Now they are noticing that patient is more confused and often hallucinating, seeing things that do not exist in the room.  Patient is oriented x3.  She complains of headache, neck pain, back pain and abdominal pain.  She is not on any blood thinners.  She denies any recent fevers, chills.  She also denies any UTI-like symptoms.  Home Medications Prior to Admission medications   Medication Sig Start Date End Date Taking? Authorizing Provider  albuterol (PROAIR HFA) 108 (90 Base) MCG/ACT inhaler Inhale 2 puffs into the lungs every 4 (four) hours as needed for wheezing or shortness of breath. 04/06/21  Yes Alfonse Spruce, MD  aspirin 81 MG chewable tablet Chew 1 tablet (81 mg total) by mouth daily. 04/22/19  Yes Hongalgi, Maximino Greenland, MD  Benralizumab (FASENRA) 30 MG/ML SOSY Inject 1 mL (30 mg total) into the skin every 8 (eight) weeks. 09/04/21  Yes Alfonse Spruce, MD  DULoxetine (CYMBALTA) 30 MG capsule Take 30 mg by mouth 2 (two) times daily.   Yes [provider]  fluticasone (FLONASE) 50 MCG/ACT nasal spray SPRAY 2 SPRAYS INTO EACH NOSTRIL EVERY DAY 04/06/21  Yes Alfonse Spruce, MD  Fluticasone Furoate (ARNUITY ELLIPTA) 200 MCG/ACT AEPB TAKE 1 PUFF BY MOUTH EVERY DAY 04/06/21  Yes Alfonse Spruce, MD  folic acid (FOLVITE) 1 MG tablet Take 1 mg by  mouth daily. 09/08/19  Yes [provider]  losartan-hydrochlorothiazide (HYZAAR) 100-12.5 MG tablet Take 1 tablet by mouth daily. 09/09/21  Yes [provider]  methotrexate (RHEUMATREX) 2.5 MG tablet SMARTSIG:3 Tablet(s) By Mouth Once a Week 08/19/19  Yes [provider]  montelukast (SINGULAIR) 10 MG tablet Take 10 mg by mouth at bedtime. 12/07/19  Yes [provider]  pregabalin (LYRICA) 50 MG capsule Take 50 mg by mouth 2 (two) times daily. 09/19/21  Yes [provider]  simvastatin (ZOCOR) 20 MG tablet Take 20 mg by mouth at bedtime.    Yes [provider]  amoxicillin-clavulanate (AUGMENTIN) 875-125 MG tablet Take 1 tablet by mouth 2 (two) times daily. Patient not taking: Reported on 09/25/2021 05/30/21   Nehemiah Settle, FNP  ENBREL SURECLICK 50 MG/ML injection Inject into the skin once a week. 02/29/20   [provider]  pantoprazole (PROTONIX) 40 MG tablet Take 1 tablet (40 mg total) by mouth daily. Patient not taking: Reported on 09/25/2021 04/06/19   Leroy Sea, MD      Allergies    Other and Sulfa antibiotics    Review of Systems   Review of Systems  All other systems reviewed and are negative.  Physical Exam Updated Vital Signs BP 122/70    Pulse (!) 109    Temp 98.4 F (36.9 C) (Oral)    Resp 17    Ht 5'  4" (1.626 m)    Wt 65.8 kg    SpO2 96%    BMI 24.89 kg/m  Physical Exam Vitals and nursing note reviewed.  Constitutional:      Appearance: She is well-developed.  HENT:     Head: Atraumatic.  Cardiovascular:     Rate and Rhythm: Normal rate.  Pulmonary:     Effort: Pulmonary effort is normal.  Musculoskeletal:     Cervical back: Normal range of motion and neck supple.  Skin:    General: Skin is warm and dry.  Neurological:     General: No focal deficit present.     Mental Status: She is alert and oriented to person, place, and time.    ED Results / Procedures / Treatments   Labs (all labs ordered  are listed, but only abnormal results are displayed) Labs Reviewed  BASIC METABOLIC PANEL - Abnormal; Notable for the following components:      Result Value   Sodium 133 (*)    Potassium 3.1 (*)    Chloride 96 (*)    CO2 21 (*)    Glucose, Bld 115 (*)    BUN 72 (*)    Creatinine, Ser 3.93 (*)    GFR, Estimated 11 (*)    Anion gap 16 (*)    All other components within normal limits  CBC - Abnormal; Notable for the following components:   WBC 20.8 (*)    RBC 3.85 (*)    Hemoglobin 11.7 (*)    HCT 35.1 (*)    All other components within normal limits  HEPATIC FUNCTION PANEL - Abnormal; Notable for the following components:   Total Protein 8.4 (*)    Albumin 3.1 (*)    AST 135 (*)    ALT 68 (*)    All other components within normal limits  RESP PANEL BY RT-PCR (FLU A&B, COVID) ARPGX2  CK  URINALYSIS, ROUTINE W REFLEX MICROSCOPIC  CBG MONITORING, ED    EKG EKG Interpretation  Date/Time:  Tuesday September 25 2021 13:42:43 EST Ventricular Rate:  108 PR Interval:  156 QRS Duration: 97 QT Interval:  316 QTC Calculation: 424 R Axis:   6 Text Interpretation: Sinus tachycardia Consider right atrial enlargement Probable inferior infarct, old Probable anterolateral infarct, old No acute changes Confirmed by Derwood KaplanNanavati, Reilynn Lauro [phone] on 09/25/2021 2:57:44 PM  Radiology No results found.  Procedures .Critical Care Performed by: Derwood KaplanNanavati, Nitasha Jewel, MD Authorized by: Derwood KaplanNanavati, Lylith Bebeau, MD   Critical care provider statement:    Critical care time (minutes):  40   Critical care was necessary to treat or prevent imminent or life-threatening deterioration of the following conditions:  Renal failure and CNS failure or compromise   Critical care was time spent personally by me on the following activities:  Development of treatment plan with patient or surrogate, discussions with consultants, evaluation of patient's response to treatment, examination of patient, ordering and review of  laboratory studies, ordering and review of radiographic studies, ordering and performing treatments and interventions, pulse oximetry, re-evaluation of patient's condition and review of old charts    Medications Ordered in ED Medications  lactated ringers bolus 2,000 mL (2,000 mLs Intravenous New Bag/Given 09/25/21 1450)    ED Course/ Medical Decision Making/ A&P                           Medical Decision Making Amount and/or Complexity of Data Reviewed Labs: ordered. Radiology: ordered.  Risk  Decision regarding hospitalization.   76 year old female with history of fibromyalgia, hypertension brought into the ER with chief complaint of increasing weakness, confusion over the last several days.  Today she had a fall and now she is hallucinating.  Patient has no focal neurodeficit, but clearly appears to be sluggish.  She is oriented x3 but took a long time to respond.  She also appeared slightly confused. No signs of severe trauma on physical exam no focal neurodeficit appreciated.  Based on history, suspicion is high for underlying infectious process versus metabolic derangement leading to her increased weakness.  From trauma perspective, differential diagnosis includes subdural hematoma, cervical spine injury, rib fractures, intra-abdominal pathology such as perforated viscus.  Basic labs have been ordered.   Reassessment: Patient's basic blood work-up reveals elevated white count, baseline normal hemoglobin and new renal failure with uremia.  Her uremia is likely the cause for her confusion.  Renal failure is likely a result of reduced p.o. intake.  Again, no clear indication of infection on history -therefore we will not start any antibiotics at this time. Noncontrast CT scans ordered.  Incoming team to follow-up on the results.  Labs did not reveal any hyperkalemia. History provided by patient's daughter.  Final Clinical Impression(s) / ED Diagnoses Final diagnoses:  AKI  (acute kidney injury) (HCC)  Uremia  Altered mental status, unspecified altered mental status type    Rx / DC Orders ED Discharge Orders     None         Derwood Kaplan, MD 09/25/21 1600    Derwood Kaplan, MD 09/25/21 1601

## 2021-09-25 NOTE — Assessment & Plan Note (Addendum)
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  Plan Continue home regimen at discharge and close follow up recommended with Dr. Deanne CofferAryal.

## 2021-09-25 NOTE — Assessment & Plan Note (Addendum)
Followed by Rhematology. Patient has had increased symptoms and strongly advised to follow up with her rheumatologist as soon as discharged from hospital.

## 2021-09-26 ENCOUNTER — Observation Stay (HOSPITAL_COMMUNITY): Payer: Medicare Other

## 2021-09-26 ENCOUNTER — Encounter (HOSPITAL_COMMUNITY): Payer: Self-pay | Admitting: Internal Medicine

## 2021-09-26 ENCOUNTER — Inpatient Hospital Stay (HOSPITAL_COMMUNITY): Payer: Medicare Other | Admitting: Anesthesiology

## 2021-09-26 DIAGNOSIS — R4182 Altered mental status, unspecified: Secondary | ICD-10-CM | POA: Diagnosis not present

## 2021-09-26 DIAGNOSIS — J690 Pneumonitis due to inhalation of food and vomit: Secondary | ICD-10-CM | POA: Diagnosis present

## 2021-09-26 DIAGNOSIS — Z8249 Family history of ischemic heart disease and other diseases of the circulatory system: Secondary | ICD-10-CM | POA: Diagnosis not present

## 2021-09-26 DIAGNOSIS — N179 Acute kidney failure, unspecified: Secondary | ICD-10-CM | POA: Diagnosis present

## 2021-09-26 DIAGNOSIS — M069 Rheumatoid arthritis, unspecified: Secondary | ICD-10-CM | POA: Diagnosis present

## 2021-09-26 DIAGNOSIS — I10 Essential (primary) hypertension: Secondary | ICD-10-CM

## 2021-09-26 DIAGNOSIS — J9602 Acute respiratory failure with hypercapnia: Secondary | ICD-10-CM | POA: Diagnosis present

## 2021-09-26 DIAGNOSIS — J449 Chronic obstructive pulmonary disease, unspecified: Secondary | ICD-10-CM | POA: Diagnosis present

## 2021-09-26 DIAGNOSIS — R0603 Acute respiratory distress: Secondary | ICD-10-CM | POA: Diagnosis present

## 2021-09-26 DIAGNOSIS — J9601 Acute respiratory failure with hypoxia: Secondary | ICD-10-CM | POA: Diagnosis present

## 2021-09-26 DIAGNOSIS — J410 Simple chronic bronchitis: Secondary | ICD-10-CM | POA: Diagnosis not present

## 2021-09-26 DIAGNOSIS — E871 Hypo-osmolality and hyponatremia: Secondary | ICD-10-CM | POA: Diagnosis present

## 2021-09-26 DIAGNOSIS — Z882 Allergy status to sulfonamides status: Secondary | ICD-10-CM | POA: Diagnosis not present

## 2021-09-26 DIAGNOSIS — M797 Fibromyalgia: Secondary | ICD-10-CM | POA: Diagnosis present

## 2021-09-26 DIAGNOSIS — R338 Other retention of urine: Secondary | ICD-10-CM | POA: Diagnosis not present

## 2021-09-26 DIAGNOSIS — E860 Dehydration: Secondary | ICD-10-CM | POA: Diagnosis present

## 2021-09-26 DIAGNOSIS — E440 Moderate protein-calorie malnutrition: Secondary | ICD-10-CM | POA: Diagnosis present

## 2021-09-26 DIAGNOSIS — I129 Hypertensive chronic kidney disease with stage 1 through stage 4 chronic kidney disease, or unspecified chronic kidney disease: Secondary | ICD-10-CM | POA: Diagnosis present

## 2021-09-26 DIAGNOSIS — R339 Retention of urine, unspecified: Secondary | ICD-10-CM | POA: Diagnosis present

## 2021-09-26 DIAGNOSIS — E876 Hypokalemia: Secondary | ICD-10-CM | POA: Diagnosis present

## 2021-09-26 DIAGNOSIS — F32A Depression, unspecified: Secondary | ICD-10-CM | POA: Diagnosis present

## 2021-09-26 DIAGNOSIS — B370 Candidal stomatitis: Secondary | ICD-10-CM | POA: Diagnosis present

## 2021-09-26 DIAGNOSIS — Z20822 Contact with and (suspected) exposure to covid-19: Secondary | ICD-10-CM | POA: Diagnosis present

## 2021-09-26 DIAGNOSIS — J8283 Eosinophilic asthma: Secondary | ICD-10-CM | POA: Diagnosis present

## 2021-09-26 DIAGNOSIS — T17908A Unspecified foreign body in respiratory tract, part unspecified causing other injury, initial encounter: Secondary | ICD-10-CM | POA: Diagnosis present

## 2021-09-26 DIAGNOSIS — R443 Hallucinations, unspecified: Secondary | ICD-10-CM | POA: Diagnosis present

## 2021-09-26 DIAGNOSIS — J9811 Atelectasis: Secondary | ICD-10-CM | POA: Diagnosis present

## 2021-09-26 DIAGNOSIS — G9341 Metabolic encephalopathy: Secondary | ICD-10-CM | POA: Diagnosis present

## 2021-09-26 DIAGNOSIS — N183 Chronic kidney disease, stage 3 unspecified: Secondary | ICD-10-CM | POA: Diagnosis present

## 2021-09-26 LAB — BLOOD GAS, ARTERIAL
Acid-base deficit: 6.2 mmol/L — ABNORMAL HIGH (ref 0.0–2.0)
Acid-base deficit: 6.2 mmol/L — ABNORMAL HIGH (ref 0.0–2.0)
Bicarbonate: 22 mmol/L (ref 20.0–28.0)
Bicarbonate: 19 mmol/L — ABNORMAL LOW (ref 20.0–28.0)
Drawn by: 41977
Drawn by: 41977
FIO2: 80 %
FIO2: 100 %
O2 Saturation: 99.6 %
O2 Saturation: 100 %
Patient temperature: 37
Patient temperature: 37
pCO2 arterial: 55 mmHg — ABNORMAL HIGH (ref 32–48)
pCO2 arterial: 36 mmHg (ref 32–48)
pH, Arterial: 7.21 — ABNORMAL LOW (ref 7.35–7.45)
pH, Arterial: 7.33 — ABNORMAL LOW (ref 7.35–7.45)
pO2, Arterial: 196 mmHg — ABNORMAL HIGH (ref 83–108)
pO2, Arterial: 377 mmHg — ABNORMAL HIGH (ref 83–108)

## 2021-09-26 LAB — URINALYSIS, ROUTINE W REFLEX MICROSCOPIC
Bilirubin Urine: NEGATIVE
Bilirubin Urine: NEGATIVE
Glucose, UA: NEGATIVE mg/dL
Glucose, UA: NEGATIVE mg/dL
Ketones, ur: 5 mg/dL — AB
Ketones, ur: NEGATIVE mg/dL
Nitrite: NEGATIVE
Nitrite: NEGATIVE
Protein, ur: 30 mg/dL — AB
Protein, ur: 30 mg/dL — AB
Specific Gravity, Urine: 1.011 (ref 1.005–1.030)
Specific Gravity, Urine: 1.01 (ref 1.005–1.030)
WBC, UA: >50 WBC/hpf — ABNORMAL HIGH (ref 0–5)
pH: 5 (ref 5.0–8.0)
pH: 5 (ref 5.0–8.0)

## 2021-09-26 LAB — CBC
HCT: 34.7 % — ABNORMAL LOW (ref 36.0–46.0)
Hemoglobin: 11.4 g/dL — ABNORMAL LOW (ref 12.0–15.0)
MCH: 30.2 pg (ref 26.0–34.0)
MCHC: 32.9 g/dL (ref 30.0–36.0)
MCV: 91.8 fL (ref 80.0–100.0)
Platelets: 287 10*3/uL (ref 150–400)
RBC: 3.78 MIL/uL — ABNORMAL LOW (ref 3.87–5.11)
RDW: 15.3 % (ref 11.5–15.5)
WBC: 24.5 10*3/uL — ABNORMAL HIGH (ref 4.0–10.5)
nRBC: 0 % (ref 0.0–0.2)

## 2021-09-26 LAB — MAGNESIUM
Magnesium: 2.2 mg/dL (ref 1.7–2.4)
Magnesium: 2.3 mg/dL (ref 1.7–2.4)

## 2021-09-26 LAB — COMPREHENSIVE METABOLIC PANEL
ALT: 68 U/L — ABNORMAL HIGH (ref 0–44)
AST: 112 U/L — ABNORMAL HIGH (ref 15–41)
Albumin: 3 g/dL — ABNORMAL LOW (ref 3.5–5.0)
Alkaline Phosphatase: 70 U/L (ref 38–126)
Anion gap: 15 (ref 5–15)
BUN: 71 mg/dL — ABNORMAL HIGH (ref 8–23)
CO2: 20 mmol/L — ABNORMAL LOW (ref 22–32)
Calcium: 9.2 mg/dL (ref 8.9–10.3)
Chloride: 99 mmol/L (ref 98–111)
Creatinine, Ser: 3 mg/dL — ABNORMAL HIGH (ref 0.44–1.00)
GFR, Estimated: 16 mL/min — ABNORMAL LOW (ref >60)
Glucose, Bld: 85 mg/dL (ref 70–99)
Potassium: 3.8 mmol/L (ref 3.5–5.1)
Sodium: 134 mmol/L — ABNORMAL LOW (ref 135–145)
Total Bilirubin: 0.8 mg/dL (ref 0.3–1.2)
Total Protein: 7.9 g/dL (ref 6.5–8.1)

## 2021-09-26 LAB — GLUCOSE, CAPILLARY
Glucose-Capillary: 120 mg/dL — ABNORMAL HIGH (ref 70–99)
Glucose-Capillary: 128 mg/dL — ABNORMAL HIGH (ref 70–99)
Glucose-Capillary: 139 mg/dL — ABNORMAL HIGH (ref 70–99)
Glucose-Capillary: 122 mg/dL — ABNORMAL HIGH (ref 70–99)
Glucose-Capillary: 129 mg/dL — ABNORMAL HIGH (ref 70–99)

## 2021-09-26 LAB — CREATININE, URINE, RANDOM: Creatinine, Urine: 93.66 mg/dL

## 2021-09-26 LAB — OSMOLALITY, URINE: Osmolality, Ur: 390 mOsm/kg (ref 300–900)

## 2021-09-26 LAB — MRSA NEXT GEN BY PCR, NASAL: MRSA by PCR Next Gen: NOT DETECTED

## 2021-09-26 LAB — PHOSPHORUS
Phosphorus: 4.5 mg/dL (ref 2.5–4.6)
Phosphorus: 3.2 mg/dL (ref 2.5–4.6)

## 2021-09-26 LAB — SODIUM, URINE, RANDOM: Sodium, Ur: 67 mmol/L

## 2021-09-26 MED ORDER — ASPIRIN 81 MG PO CHEW
81.0000 mg | CHEWABLE_TABLET | Freq: Every day | ORAL | Status: DC
  Administered 2021-09-27: 81 mg
  Filled 2021-09-26: qty 1

## 2021-09-26 MED ORDER — ALBUTEROL SULFATE (2.5 MG/3ML) 0.083% IN NEBU: 2.5000 mg | INHALATION_SOLUTION | RESPIRATORY_TRACT | Status: DC | PRN

## 2021-09-26 MED ORDER — PROPOFOL 10 MG/ML IV BOLUS
INTRAVENOUS | Status: AC
  Administered 2021-09-26: 200 mg
  Filled 2021-09-26: qty 20

## 2021-09-26 MED ORDER — NALOXONE HCL 2 MG/2ML IJ SOSY
PREFILLED_SYRINGE | INTRAMUSCULAR | Status: AC
  Administered 2021-09-26: 2 mg
  Filled 2021-09-26: qty 2

## 2021-09-26 MED ORDER — ROCURONIUM BROMIDE 10 MG/ML (PF) SYRINGE
PREFILLED_SYRINGE | INTRAVENOUS | Status: AC
  Filled 2021-09-26: qty 10

## 2021-09-26 MED ORDER — FENTANYL CITRATE PF 50 MCG/ML IJ SOSY: 25.0000 ug | PREFILLED_SYRINGE | INTRAMUSCULAR | Status: DC | PRN

## 2021-09-26 MED ORDER — SUCCINYLCHOLINE CHLORIDE 200 MG/10ML IV SOSY
PREFILLED_SYRINGE | INTRAVENOUS | Status: AC
  Administered 2021-09-26: 200 mg
  Filled 2021-09-26: qty 10

## 2021-09-26 MED ORDER — LIDOCAINE HCL (CARDIAC) PF 100 MG/5ML IV SOSY
PREFILLED_SYRINGE | INTRAVENOUS | Status: DC | PRN
  Administered 2021-09-26: 100 mg via INTRAVENOUS

## 2021-09-26 MED ORDER — NALOXONE HCL 0.4 MG/ML IJ SOLN
INTRAMUSCULAR | Status: AC
  Administered 2021-09-26: 0.4 mg via INTRAVENOUS
  Filled 2021-09-26: qty 1

## 2021-09-26 MED ORDER — CHLORHEXIDINE GLUCONATE CLOTH 2 % EX PADS
6.0000 | MEDICATED_PAD | Freq: Every day | CUTANEOUS | Status: DC
  Administered 2021-09-26 – 2021-09-30 (×4): 6 via TOPICAL

## 2021-09-26 MED ORDER — NALOXONE HCL 2 MG/2ML IJ SOSY
PREFILLED_SYRINGE | INTRAMUSCULAR | Status: AC
Start: 2021-09-26 — End: 2021-09-26
  Administered 2021-09-26: 2 mg
  Filled 2021-09-26: qty 2

## 2021-09-26 MED ORDER — POTASSIUM CHLORIDE IN NACL 20-0.9 MEQ/L-% IV SOLN: INTRAVENOUS | Status: DC

## 2021-09-26 MED ORDER — LIDOCAINE HCL (CARDIAC) PF 100 MG/5ML IV SOSY
PREFILLED_SYRINGE | INTRAVENOUS | Status: AC
  Administered 2021-09-26: 100 mg
  Filled 2021-09-26: qty 5

## 2021-09-26 MED ORDER — MIDAZOLAM HCL 2 MG/2ML IJ SOLN: 1.0000 mg | INTRAMUSCULAR | Status: DC | PRN

## 2021-09-26 MED ORDER — ETOMIDATE 2 MG/ML IV SOLN
INTRAVENOUS | Status: AC
  Filled 2021-09-26: qty 20

## 2021-09-26 MED ORDER — PANTOPRAZOLE SODIUM 40 MG IV SOLR
40.0000 mg | INTRAVENOUS | Status: DC
  Administered 2021-09-26: 40 mg via INTRAVENOUS
  Filled 2021-09-26: qty 10

## 2021-09-26 MED ORDER — PANTOPRAZOLE 2 MG/ML SUSPENSION
40.0000 mg | Freq: Every day | ORAL | Status: DC
  Administered 2021-09-27: 40 mg
  Filled 2021-09-26: qty 20

## 2021-09-26 MED ORDER — CHLORHEXIDINE GLUCONATE 0.12% ORAL RINSE (MEDLINE KIT)
15.0000 mL | Freq: Two times a day (BID) | OROMUCOSAL | Status: DC
  Administered 2021-09-26 – 2021-09-27 (×3): 15 mL via OROMUCOSAL

## 2021-09-26 MED ORDER — POLYETHYLENE GLYCOL 3350 17 G PO PACK
17.0000 g | PACK | Freq: Every day | ORAL | Status: DC
  Administered 2021-09-26 – 2021-09-27 (×2): 17 g
  Filled 2021-09-26 (×2): qty 1

## 2021-09-26 MED ORDER — PROPOFOL 10 MG/ML IV BOLUS
INTRAVENOUS | Status: DC | PRN
  Administered 2021-09-26: 100 mg via INTRAVENOUS

## 2021-09-26 MED ORDER — NALOXONE HCL 0.4 MG/ML IJ SOLN
0.4000 mg | INTRAMUSCULAR | Status: DC | PRN
  Filled 2021-09-26: qty 1

## 2021-09-26 MED ORDER — MONTELUKAST SODIUM 10 MG PO TABS
10.0000 mg | ORAL_TABLET | Freq: Every day | ORAL | Status: DC
  Administered 2021-09-26: 10 mg
  Filled 2021-09-26: qty 1

## 2021-09-26 MED ORDER — SODIUM CHLORIDE 0.9 % IV BOLUS
250.0000 mL | Freq: Once | INTRAVENOUS | Status: AC
  Administered 2021-09-26: 250 mL via INTRAVENOUS

## 2021-09-26 MED ORDER — SIMVASTATIN 20 MG PO TABS
20.0000 mg | ORAL_TABLET | Freq: Every day | ORAL | Status: DC
  Administered 2021-09-26: 20 mg
  Filled 2021-09-26: qty 1

## 2021-09-26 MED ORDER — FOLIC ACID 1 MG PO TABS
1.0000 mg | ORAL_TABLET | Freq: Every day | ORAL | Status: DC
  Administered 2021-09-27: 1 mg
  Filled 2021-09-26: qty 1

## 2021-09-26 MED ORDER — FOSFOMYCIN TROMETHAMINE 3 G PO PACK: 3.0000 g | PACK | Freq: Once | ORAL | Status: DC

## 2021-09-26 MED ORDER — SUCCINYLCHOLINE CHLORIDE 200 MG/10ML IV SOSY
PREFILLED_SYRINGE | INTRAVENOUS | Status: DC | PRN
  Administered 2021-09-26: 160 mg via INTRAVENOUS

## 2021-09-26 MED ORDER — SODIUM CHLORIDE 0.9 % IV SOLN
3.0000 g | Freq: Two times a day (BID) | INTRAVENOUS | Status: DC
  Administered 2021-09-26 – 2021-09-30 (×8): 3 g via INTRAVENOUS
  Filled 2021-09-26 (×11): qty 8

## 2021-09-26 MED ORDER — FENTANYL CITRATE PF 50 MCG/ML IJ SOSY
25.0000 ug | PREFILLED_SYRINGE | INTRAMUSCULAR | Status: DC | PRN
  Administered 2021-09-26: 25 ug via INTRAVENOUS
  Administered 2021-09-26: 50 ug via INTRAVENOUS
  Filled 2021-09-26 (×2): qty 1

## 2021-09-26 MED ORDER — DOCUSATE SODIUM 50 MG/5ML PO LIQD
100.0000 mg | Freq: Two times a day (BID) | ORAL | Status: DC
  Administered 2021-09-26 – 2021-09-27 (×3): 100 mg
  Filled 2021-09-26 (×3): qty 10

## 2021-09-26 MED ORDER — MIDAZOLAM HCL 2 MG/2ML IJ SOLN
1.0000 mg | INTRAMUSCULAR | Status: DC | PRN
  Administered 2021-09-26: 1 mg via INTRAVENOUS
  Filled 2021-09-26: qty 2

## 2021-09-26 MED ORDER — LABETALOL HCL 5 MG/ML IV SOLN
10.0000 mg | INTRAVENOUS | Status: DC | PRN
  Administered 2021-09-27 – 2021-09-30 (×3): 10 mg via INTRAVENOUS
  Filled 2021-09-26 (×4): qty 4

## 2021-09-26 MED ORDER — HYDROMORPHONE HCL 1 MG/ML IJ SOLN: 0.5000 mg | INTRAMUSCULAR | Status: DC | PRN

## 2021-09-26 MED ORDER — LIDOCAINE HCL (CARDIAC) PF 100 MG/5ML IV SOSY
PREFILLED_SYRINGE | INTRAVENOUS | Status: AC
  Filled 2021-09-26: qty 5

## 2021-09-26 MED ORDER — VITAL HIGH PROTEIN PO LIQD
1000.0000 mL | ORAL | Status: DC
  Administered 2021-09-26: 1000 mL

## 2021-09-26 MED ORDER — ORAL CARE MOUTH RINSE
15.0000 mL | OROMUCOSAL | Status: DC
  Administered 2021-09-26 – 2021-09-27 (×11): 15 mL via OROMUCOSAL

## 2021-09-26 MED ORDER — PROSOURCE TF PO LIQD
45.0000 mL | Freq: Two times a day (BID) | ORAL | Status: DC
  Administered 2021-09-26: 45 mL
  Filled 2021-09-26: qty 45

## 2021-09-26 MED ORDER — BUDESONIDE 0.5 MG/2ML IN SUSP
0.5000 mg | Freq: Two times a day (BID) | RESPIRATORY_TRACT | Status: DC
  Administered 2021-09-26 – 2021-09-30 (×8): 0.5 mg via RESPIRATORY_TRACT
  Filled 2021-09-26 (×8): qty 2

## 2021-09-26 MED ORDER — SODIUM CHLORIDE 0.9 % IV SOLN: INTRAVENOUS | Status: DC

## 2021-09-26 MED ORDER — PREGABALIN 25 MG PO CAPS: 50.0000 mg | ORAL_CAPSULE | Freq: Two times a day (BID) | ORAL | Status: DC

## 2021-09-26 NOTE — Progress Notes (Signed)
PROGRESS NOTE   Betty Golden  VQX:[id number] DOB: 1946/07/14 DOA: 09/25/2021 PCP: Normand Sloop, MD   Chief Complaint  Patient presents with   Fall   Level of care: ICU  Brief Admission History:  76 y/o patient has history of fibromyalgia, hypertension, COPD, CKD, RA. She is followed by Dr. Georgette Dover at [facility] and has frequent visit for medication monitoring. Per daughter patient had her fibromyalgia and arthritis flareup about 2 weeks ago.  Since then patient has not been eating or drinking well.  She has also been more sedentary.  Yesterday patient had a fall, and she struck the back of her head.  Now they are noticing that patient is more confused and often hallucinating, seeing things that do not exist in the room. For these problems she presents to [facility] for evaluation.     ED Course: T 98.4, 142/64  96  RR 15 BMI 25. Na 133, K 3.1 BUN 72,m CR 3.93 (last 1.25). Alb 8.1, AST 135, ALT 68, WBC 20.8 diff pending, Hgb 11.7. CT c-spine w/o acute change, CT head - old lacunar R posterior corona radiata, second old lacunar injury no acute changes. CT Ab/Pel no acute changes, right posterior subsegmental atelectasis. In ED patient received IVF. TRH called to admit for pre-renal azotemia and metabolic derangement 2/2 dehydration  09/26/2021:  after pain medication given early morning, developed increasing somnolence.  RR called and naloxone given without significant improvement.  Pt continued having periods of apnea.  ABG pH 7.21. Rapid intubation completed for airway protection, transferred to ICU. PCCM consulted.    Assessment and Plan: Aspiration pneumonia ([place])- (present on admission) Suspected that it happened early morning 3/1 Intubated for airway protection NT suction as needed Amp/sulbactam ordered   Acute urinary retention- (present on admission) I/O cath, if persists may require foley placement  Follow urine output   Leukocytosis- (present on admission) Possibly secondary to  pneumonia Checking procalcitonin in AM   RA (rheumatoid arthritis) ([place])- (present on admission) Followed closely by Dr Aryal-rhematology on aggressive immunotherapy.  Plan Continue home regimen  Fibromyalgia- (present on admission) Followed by Rhematology. Patient has had increased symptoms but no focal findings.  Plan Continue home regimen.   Dehydration- (present on admission) IV fluid as ordered  Acute renal failure ([place])- (present on admission) Slowly improving with IV fluid hydration  Recheck in AM.   COPD (chronic obstructive pulmonary disease) ([place])- (present on admission) Bronchodilators as ordered Pulmonary consultation  Essential hypertension- (present on admission) BP stable. Continue home meds including ARB. If AKI persists will hold ARB   DVT prophylaxis: [place] heparin Code Status: full  Family Communication: daughter/husband updated x 2 09/26/21 Disposition: Status is: Inpatient Remains inpatient appropriate because: ICU care required severity of illness  Consultants:  PCCM Procedures:  Intubated 09/26/21 Antimicrobials:  Unasyn 3/1>>  Subjective: Pt sedated on vent Objective: Vitals:   09/26/21 1630 09/26/21 1645 09/26/21 1700 09/26/21 1715  BP: 133/64 (!) 143/68 140/66 (!) 145/67  Pulse: 81 81 83 81  Resp: _0 Temp:      TempSrc:      SpO2: 100% 99% 100% 100%  Weight:      Height:        Intake/Output Summary (Last 24 hours) at 09/26/2021 1802 Last data filed at 09/26/2021 1733 Gross per 24 hour  Intake 3093.81 ml  Output 1300 ml  Net 1793.81 ml   Filed Weights   09/25/21 1330 09/26/21 0100 09/26/21 1530  Weight: 65.8 kg 65.5 kg 66.3 kg   Examination:  General exam: intubated on vent,Appears calm and comfortable  Respiratory system: rales heard bilaterally. Cardiovascular system: normal S1 & S2 heard. Tachycardic, No JVD, murmurs, rubs, gallops or clicks. No pedal edema. Gastrointestinal system: Abdomen is nondistended, soft and  nontender. No organomegaly or masses felt. Normal bowel sounds heard. Central nervous system: sedated on vent. No focal neurological deficits. Extremities: Symmetric 5 x 5 power. Skin: No rashes, lesions or ulcers. Psychiatry: sedated on vent.   Data Reviewed: I have personally reviewed following labs and imaging studies  CBC: Recent Labs  Lab 09/25/21 1340 09/25/21 2009 09/26/21 0428  WBC 20.8* 22.5* 24.5*  NEUTROABS  --  18.6*  --   HGB 11.7* 12.0 11.4*  HCT 35.1* 36.0 34.7*  MCV 91.2 91.1 91.8  PLT 285 263 469    Basic Metabolic Panel: Recent Labs  Lab 09/25/21 1340 09/25/21 1915 09/26/21 0428 09/26/21 1235  NA 133* 134* 134*  --   K 3.1* 4.0 3.8  --   CL 96* 101 99  --   CO2 21* 18* 20*  --   GLUCOSE 115* 100* 85  --   BUN 72* 70* 71*  --   CREATININE 3.93* 3.48* 3.00*  --   CALCIUM 9.1 8.9 9.2  --   MG  --   --   --  2.2  PHOS  --   --   --  4.5    CBG: Recent Labs  Lab 09/26/21 0819 09/26/21 0923 09/26/21 1147 09/26/21 1602  GLUCAP 120* 128* 139* 122*    Recent Results (from the past 240 hour(s))  Resp Panel by RT-PCR (Flu A&B, Covid) Nasopharyngeal Swab     Status: None   Collection Time: 09/25/21  2:28 PM   Specimen: Nasopharyngeal Swab; Nasopharyngeal(NP) swabs in vial transport medium  Result Value Ref Range Status   SARS Coronavirus 2 by RT PCR NEGATIVE NEGATIVE Final    Comment: (NOTE) SARS-CoV-2 target nucleic acids are NOT DETECTED.  The SARS-CoV-2 RNA is generally detectable in upper respiratory specimens during the acute phase of infection. The lowest concentration of SARS-CoV-2 viral copies this assay can detect is 138 copies/mL. A negative result does not preclude SARS-Cov-2 infection and should not be used as the sole basis for treatment or other patient management decisions. A negative result may occur with  improper specimen collection/handling, submission of specimen other than nasopharyngeal swab, presence of viral mutation(s)  within the areas targeted by this assay, and inadequate number of viral copies(<138 copies/mL). A negative result must be combined with clinical observations, patient history, and epidemiological information. The expected result is Negative.  Fact Sheet for Patients:  EntrepreneurPulse.com.au  Fact Sheet for Healthcare Providers:  IncredibleEmployment.be  This test is no t yet approved or cleared by the [place] FDA and  has been authorized for detection and/or diagnosis of SARS-CoV-2 by FDA under an Emergency Use Authorization (EUA). This EUA will remain  in effect (meaning this test can be used) for the duration of the COVID-19 declaration under Section 564(b)(1) of the Act, 21 U.S.C.section 360bbb-3(b)(1), unless the authorization is terminated  or revoked sooner.       Influenza A by PCR NEGATIVE NEGATIVE Final   Influenza B by PCR NEGATIVE NEGATIVE Final    Comment: (NOTE) The Xpert Xpress SARS-CoV-2/FLU/RSV plus assay is intended as an aid in the diagnosis of influenza from Nasopharyngeal swab specimens and should not be used as a sole  basis for treatment. Nasal washings and aspirates are unacceptable for Xpert Xpress SARS-CoV-2/FLU/RSV testing.  Fact Sheet for Patients: EntrepreneurPulse.com.au  Fact Sheet for Healthcare Providers: IncredibleEmployment.be  This test is not yet approved or cleared by the [place] FDA and has been authorized for detection and/or diagnosis of SARS-CoV-2 by FDA under an Emergency Use Authorization (EUA). This EUA will remain in effect (meaning this test can be used) for the duration of the COVID-19 declaration under Section 564(b)(1) of the Act, 21 U.S.C. section 360bbb-3(b)(1), unless the authorization is terminated or revoked.  Performed at [facility], [address] [place], [place] [address]   Culture, blood (routine x 2)     Status: None  (Preliminary result)   Collection Time: 09/26/21 11:21 AM   Specimen: BLOOD  Result Value Ref Range Status   Specimen Description BLOOD BLOOD LEFT WRIST  Final   Special Requests   Final    BOTTLES DRAWN AEROBIC AND ANAEROBIC Blood Culture results may not be optimal due to an inadequate volume of blood received in culture bottles Performed at [facility], [address], [place], [place] [address]    Culture PENDING  Incomplete   Report Status PENDING  Incomplete     Radiology Studies: CT Head Wo Contrast  Result Date: 09/25/2021 CLINICAL DATA:  Head trauma minor. Fell backwards yesterday hitting head. Headache. EXAM: CT HEAD WITHOUT CONTRAST TECHNIQUE: Contiguous axial images were obtained from the base of the skull through the vertex without intravenous contrast. RADIATION DOSE REDUCTION: This exam was performed according to the departmental dose-optimization program which includes automated exposure control, adjustment of the mA and/or kV according to patient size and/or use of iterative reconstruction technique. COMPARISON:  CT brain 04/16/2019 FINDINGS: Brain: The ventricles are normal in size and configuration. The basilar cisterns are patent. No mass, mass effect, or midline shift. Unchanged focal hypodensity likely lacunar infarct within the right posterior frontal corona radiata white matter (axial image 20). Additional chronic lacunar infarct is again seen within the anterior limb of the left internal capsule. No acute intracranial hemorrhage is seen. No abnormal extra-axial fluid collection. Preservation of the normal cortical gray-white interface without CT evidence of an acute major vascular territorial cortical based infarction. Vascular: No hyperdense vessel or unexpected calcification. Skull: Normal. Negative for fracture or focal lesion. Sinuses/Orbits: The visualized orbits are unremarkable. The visualized paranasal sinuses and mastoid air cells are clear. Bilateral maxillary sinus  antrostomy postsurgical changes are again seen. Other: None. IMPRESSION:: IMPRESSION: 1. No significant change from prior. 2. No acute intracranial process. 3. Mild chronic lacunar infarcts/small-vessel ischemic disease. Electronically Signed   By: Yvonne Kendall M.D.   On: 09/25/2021 16:26   CT Cervical Spine Wo Contrast  Result Date: 09/25/2021 CLINICAL DATA:  Fall. EXAM: CT CERVICAL SPINE WITHOUT CONTRAST TECHNIQUE: Multidetector CT imaging of the cervical spine was performed without intravenous contrast. Multiplanar CT image reconstructions were also generated. RADIATION DOSE REDUCTION: This exam was performed according to the departmental dose-optimization program which includes automated exposure control, adjustment of the mA and/or kV according to patient size and/or use of iterative reconstruction technique. COMPARISON:  None. FINDINGS: Alignment: Normal. Skull base and vertebrae: No acute fracture. No primary bone lesion or focal pathologic process. Soft tissues and spinal canal: No prevertebral fluid or swelling. No visible canal hematoma. Disc levels: Extensive anterior osteophyte formation is noted at all levels of the cervical spine. Upper chest: Negative. Other: None. IMPRESSION: Extensive degenerative changes as described above. No acute abnormality seen.  Electronically Signed   By: Marijo Conception M.D.   On: 09/25/2021 16:46   DG CHEST PORT 1 VIEW  Result Date: 09/26/2021 CLINICAL DATA:  Weakness/increased shortness of breath. EXAM: PORTABLE CHEST 1 VIEW COMPARISON:  February 06, 2021 FINDINGS: Low lung volumes. Bilateral streaky interstitial opacities in both lungs. Normal cardiac silhouette. Tortuosity and calcific atherosclerotic disease of the aorta. IMPRESSION: Bilateral streaky interstitial opacities, which may represent atypical/viral pneumonia. Electronically Signed   By: Fidela Salisbury M.D.   On: 09/26/2021 09:46   DG Chest Port 1V same Day  Result Date: 09/26/2021 CLINICAL DATA:   Post intubation EXAM: PORTABLE CHEST 1 VIEW COMPARISON:  Portable exam at 1027 hours compared to 0932 hours FINDINGS: Tip of endotracheal tube projects 3.8 cm above carina. Nasogastric tube extends into stomach. Normal heart size, mediastinal contours, and pulmonary vascularity. Lungs clear. No pleural effusion or pneumothorax. Bones demineralized. IMPRESSION: Tube positions as above. No acute abnormalities. Electronically Signed   By: Lavonia Dana M.D.   On: 09/26/2021 10:42   CT CHEST ABDOMEN PELVIS WO CONTRAST  Result Date: 09/25/2021 CLINICAL DATA:  Back pain after fall. EXAM: CT CHEST, ABDOMEN AND PELVIS WITHOUT CONTRAST TECHNIQUE: Multidetector CT imaging of the chest, abdomen and pelvis was performed following the standard protocol without IV contrast. RADIATION DOSE REDUCTION: This exam was performed according to the departmental dose-optimization program which includes automated exposure control, adjustment of the mA and/or kV according to patient size and/or use of iterative reconstruction technique. COMPARISON:  April 07, 2019. FINDINGS: CT CHEST FINDINGS Cardiovascular: Atherosclerosis of thoracic aorta is noted without aneurysm formation. Normal cardiac size. No pericardial effusion. Mediastinum/Nodes: No enlarged mediastinal, hilar, or axillary lymph nodes. Thyroid gland, trachea, and esophagus demonstrate no significant findings. Lungs/Pleura: No pneumothorax or pleural effusion is noted. Minimal right posterior basilar subsegmental atelectasis or scarring is noted. Probable scarring is noted laterally in the right upper lobe. No definite acute abnormality is noted in left lung. Musculoskeletal: No chest wall mass or suspicious bone lesions identified. CT ABDOMEN PELVIS FINDINGS Hepatobiliary: No focal liver abnormality is seen. Status post cholecystectomy. No biliary dilatation. Pancreas: Unremarkable. No pancreatic ductal dilatation or surrounding inflammatory changes. Spleen: Normal in size  without focal abnormality. Adrenals/Urinary Tract: Adrenal glands are unremarkable. Kidneys are normal, without renal calculi, focal lesion, or hydronephrosis. Bladder is unremarkable. Stomach/Bowel: The stomach appears normal. There is no evidence of bowel obstruction or inflammation. Sigmoid diverticulosis is noted. Status post appendectomy. Vascular/Lymphatic: Aortic atherosclerosis. No enlarged abdominal or pelvic lymph nodes. Reproductive: Uterus and bilateral adnexa are unremarkable. Other: No abdominal wall hernia or abnormality. No abdominopelvic ascites. Musculoskeletal: No acute or significant osseous findings. IMPRESSION: No evidence of traumatic injury seen in the chest, abdomen or pelvis. Minimal right posterior basilar subsegmental atelectasis or scarring is noted. Probable scarring seen in right upper lobe. Sigmoid diverticulosis without inflammation. Aortic Atherosclerosis (ICD10-I70.0). Electronically Signed   By: Marijo Conception M.D.   On: 09/25/2021 16:30    Scheduled Meds:  [START ON 09/27/2021] aspirin  81 mg Per Tube Daily   budesonide (PULMICORT) nebulizer solution  0.5 mg Nebulization BID   chlorhexidine gluconate (MEDLINE KIT)  15 mL Mouth Rinse BID   Chlorhexidine Gluconate Cloth  6 each Topical Daily   docusate  100 mg Per Tube BID   feeding supplement (VITAL HIGH PROTEIN)  1,000 mL Per Tube Q24H   fluticasone  2 spray Each Nare Daily   [START ON 09/06/7679] folic acid  1 mg Per Tube  Daily   heparin  5,000 Units Subcutaneous Q8H   mouth rinse  15 mL Mouth Rinse 10 times per day   montelukast  10 mg Per Tube QHS   pantoprazole sodium  40 mg Per Tube Daily   polyethylene glycol  17 g Per Tube Daily   simvastatin  20 mg Per Tube QHS   Continuous Infusions:  sodium chloride 75 mL/hr at 09/26/21 1455   ampicillin-sulbactam (UNASYN) IV Stopped (09/26/21 1249)     LOS: 0 days   Critical Care Procedure Note Authorized and Performed by: Murvin Natal MD  Total Critical Care  time:  33 mins Due to a high probability of clinically significant, life threatening deterioration, the patient required my highest level of preparedness to intervene emergently and I personally spent this critical care time directly and personally managing the patient.  This critical care time included obtaining a history; examining the patient, pulse oximetry; ordering and review of studies; arranging urgent treatment with development of a management plan; evaluation of patient's response of treatment; frequent reassessment; and discussions with other providers.  This critical care time was performed to assess and manage the high probability of imminent and life threatening deterioration that could result in multi-organ failure.  It was exclusive of separately billable procedures and treating other patients and teaching time.    Irwin Brakeman, MD How to contact the [facility] Attending or Consulting provider [place] or covering provider during after hours [place], for this patient?  Check the care team in [facility] and look for a) attending/consulting [facility] provider listed and b) the [facility] team listed Log into [email or web address] and use [place]'s universal password to access. If you do not have the password, please contact the hospital operator. Locate the [facility] provider you are looking for under [facility] and page to a number that you can be directly reached. If you still have difficulty reaching the provider, please page the [facility] (Director on Call) for the Hospitalists listed on amion for assistance.  09/26/2021, 6:02 PM

## 2021-09-26 NOTE — Assessment & Plan Note (Addendum)
Improving on IV fluid as ordered ?

## 2021-09-26 NOTE — Assessment & Plan Note (Addendum)
I/O cath, persistent requirement for I/O cath >24 hours and foley placed 3/2 ?Plan DC foley 3/4 and has been voiding. ?flomax 0.4 mg trial started ?Referred patient to urology [place] for further evaluation.    ?

## 2021-09-26 NOTE — Anesthesia Procedure Notes (Signed)
Procedure Name: Intubation ?Date/Time: 09/26/2021 10:15 AM ?Performed by: Denese Killings, MD ?Pre-anesthesia Checklist: Patient identified, Emergency Drugs available, Suction available and Patient being monitored ?Patient Re-evaluated:Patient Re-evaluated prior to induction ?Oxygen Delivery Method: Circle system utilized ?Preoxygenation: Pre-oxygenation with 100% oxygen ?Induction Type: IV induction and Rapid sequence ?Laryngoscope Size: Glidescope and 3 ?Grade View: Grade II ?Tube type: Oral ?Tube size: 7.5 mm ?Number of attempts: 1 ?Airway Equipment and Method: Stylet ?Placement Confirmation: ETT inserted through vocal cords under direct vision, positive ETCO2 and breath sounds checked- equal and bilateral ?Secured at: 22 (at lip) cm ?Tube secured with: Tape ?Dental Injury: Teeth and Oropharynx as per pre-operative assessment  ? ? ? ? ?

## 2021-09-26 NOTE — Hospital Course (Addendum)
76 y/o patient has history of fibromyalgia, hypertension, COPD, CKD, RA. She is followed by Dr. Allene Pyo at [facility] and has frequent visit for medication monitoring. Per daughter patient had her fibromyalgia and arthritis flareup about 2 weeks ago.  Since then patient has not been eating or drinking well.  She has also been more sedentary.  Yesterday patient had a fall, and she struck the back of her head.  Now they are noticing that patient is more confused and often hallucinating, seeing things that do not exist in the room. For these problems she presents to [facility] for evaluation.   ?  ?ED Course: T 98.4, 142/64  96  RR 15 BMI 25. Na 133, K 3.1 BUN 72,m CR 3.93 (last 1.25). Alb 8.1, AST 135, ALT 68, WBC 20.8 diff pending, Hgb 11.7. CT c-spine w/o acute change, CT head - old lacunar R posterior corona radiata, second old lacunar injury no acute changes. CT Ab/Pel no acute changes, right posterior subsegmental atelectasis. In ED patient received IVF. TRH called to admit for pre-renal azotemia and metabolic derangement 2/2 dehydration ? ?09/26/2021:  after pain medication given early morning, developed increasing somnolence.  RR called and naloxone given without significant improvement.  Pt continued having periods of apnea.  ABG pH 7.21. Rapid intubation completed for airway protection, transferred to ICU. PCCM consulted.  ? ?09/27/2021:  pt extubated to nasal cannula. SLP recommending dysphagia 3 diet.  ? ?09/28/2021:  Pt having headaches from elevated BPs. She is not resting that well.  She is breathing much better.  Ambulated with PT.  WBC remains elevated.  ? ?09/29/2021: Pt reports fibromyalgia symptoms flaring up being off regular home meds.  Otherwise she is breathing better.  Wanting to go home as soon as possible.   ? ?09/30/2021: renal function continues to improve, creatinine down to 1.2.  Additional potassium replaced today.  Pt says she wants to go home. She is having difficult time resting on hospital bed and it  is making her fibromyalgia worse.  She wants to follow up with her rheumatologist early this week.  ?

## 2021-09-26 NOTE — Progress Notes (Signed)
Tube feeding started per Dr Sood orders. Verbal order from Dr Sood to start tube feTGulf CoastLindley Arkansas Surgery And Endoscopy CenJohn Peter  HosAdventhealtAeronautical e62ngiLeanor RElmarie [id number] fundraiser RuKaryMerriSt. JoScripps Green Hospita(41AdvKentuckCheLupita Lea306-6MTLas Cruces Surgery CLindley John Dempsey HThe Bariatric Center Of Kansas CityHunterdon Center For SuAeronautical e32ngiLeanor RElmarie [id number] fundraiser RuKaryMerri [facility] Of CeKaiser Fnd Hosp-MantecTBaptist St. Anthony'S Health SystemLindley Atlanta EndoscopyAscension Providence HosSamaritan North Surgery CAeronautical e72ngiLeanor RElmarie [id number] fundraiser RuKaryMerChildren'S Hospital Otis R [facility] In7Endoscopy CTUf HeLindley [facility] Southwell Ambulatory Inc Dba Southwell Valdosta Endoscopy CSiskin Hospital For Physical RehabAeronautical e53ngiLeanor RElmarie [id number] fundraiser RuKaryMerri DorCommunity First Healthcare [facility] Dba MediGood Samaritan Hospital - Suffer8TuKentuckCheLupita LeaTHealthsouth Deaconess RehabiLindley [facility] City Of ManchValley Surgical CAeronautical e76ngiLeanor RElmarie [id number] fundraiser RuKaryMerriEncompass Health Treasure Coast RehaBurke RehabilitatioKentuckCheLupita Lea256Marland KitcheJXTMillennium SuLindley Mcpherson HospiOutpatient Eye Surgery CWellbridge Hospital Of FAeronautical e10ngiLeanor RElmarie [id number] fundraiser RuKaryMerri LPalmetto EndoscopyVa Medical Center And Ambulatory Care Clini(73Surgery KentucCheLupita Lea(61TPrincess Anne Ambulatory SurgeLindley Texoma Valley SurgeryThe Brook - DHarper UniversityAeronautical e11ngiLeanor RElmarie [id number] fundraiser RuKaryMerriBlack River Community MediWellstar Paulding Hospita7Venture AmbulatorKeTWaverley SLindley Arrowhead Regional MedicalTroy Regional Medical CWisconsin Specialty Surgery CAeronautical e50ngiLeanor RElmarie [id number] fundraiser RuKaryMerri Royal OakGuam Memorial Hospital AuthoriHampton BehaKentuckCheLupita Lea(TWhiteriveLindley American Surgery Center Of [facility] [place] Madison County Memorial HosCataract And Surgical Center Of LuAeronautical e87ngiLeanor RElmarie [id number] fundraiser RuKaryMerri Rincon MediEskenazi Healt(4North PoTPelhLindley St Joseph'S Hospital & HealthSurgicarNortheast Georgia Medical CenteAeronautical e61ngiLeanor RElmarie [id number] fundraiser RuKaryMerri FaPerrCoffee Regional Medical CenteTWest Haven Lindley Shepherd Eye SurgLexington Va Medical Center - CPauls Valley GeneralAeronautical e28ngiLeanor RElmarie [id number] fundraiser RuKaryMerri Atrium HeaSouth TNivano Ambulatory Lindley Spectrum Health BlodgettMerit Health NaUniversity Of Toledo MedicAeronautical e47ngiLeanor RElmarie [id number] fundraiser RuKaryMerSanta Rosa SurgeryPark Bridge Rehabilitation And Wellness Cente(6Select Specialty HKentuTGrand Strand RegionLindley Saint Clares Hospital - DoverDeer Creek Surgery CenteYuma EndoscoAeronautical e103ngiLeanor RElmarie [id number] fundraiser RuKaryMerriOrlando Orthopaedic Outpatient Surgery Encompass Health Rehabilitation Hospital Of Kingspor3Aspen Surgery Center LLC Dba KentuckCheLupita TChi St Joseph HealthLindley Carnegie Hill EnRobert Packer HosSelect Specialty Hospital CAeronautical e11ngiLeanor RElmarie [id number] fundraiser RuKaryMerNorthbank SurgiOxford Eye Surgery Center L7AdvanceLindley Lake Chelan Community HBaylor Scott & White Surgical Hospital - Fort Winifred Masterson Burke RehabilitationAeronautical e59ngiLeanor RElmarie [id number] fundraiser RuKaryMerrEncompass Health Rehabilitation HospitalHuntington Beach Hospita8Tower Outpatient Surgery Center Inc Dba Tower OutpKentuckCheLupita Lea575-TContinuLindley Scl Health Community Hospital- WestCobalt Rehabilitation Hospital Jefferson Surgical Ctr At Aeronautical e55ngiLeanor RElmarie [id number] fundraiser RuKaryMerri Blue RidgThe Mesquite Specialty Hospita2Orlando OutpaKentuTQuad City Ambulatory SLindley Heart Of The Rockies Regional MedicalSt Anthony North Health CKindred Hospital Houston Aeronautical e55ngiLeanor RElmarie [id number] fundraiser RuKaryMerriSoutheastern Regional MediSouthwestern Children'S Health Services, Inc (Acadia Healthcare3East JefferKentuckCheTAirportLindley Stamford HSt. Rose Dominican Hospitals - Rose De Lima COxford SurgeAeronautical e67ngiLeanor RElmarie [id number] fundraiser RuKaryMerriSouthwell Medical, A CampNorth Memorial Medical CentGlobal MicrKentuckTSharon RegioLindley Aspen Mountain MedicalPremier Surgical CenteCanton Eye SurgeAeronautical e35ngiLeanor RElmarie [id number] fundraiser RuKaryMeUw Health RehabilitatioMercy HospitalTTamarac Surgery Center LLC Dba The Surgery Center OLindley Encompass Health RehabilitatioEuclid HosSt. Mary'S Hospital AnAeronautical e87ngiLeanor RElmarie [id number] fundraiser RuKaryMerriOsborne County MemoriaApollo Surgery CentCheyenKentuckCheLupita Lea(435) 4Marland KitTPalomar HealtLindley Fulton State HNorthwest Med CContinuing CareAeronautical e1ngiLeanor RElmarie [id number] fundraiser RuKaryMerri WilmingtoRiverwalk SurgSurgery Center Of Pinehurs(6PiedmonKentTMid-Jefferson ExtenLindley Sabetha Community HEncompass Health Rehabilitation Hospital OfWilliam J Mccord Adolescent TreatmentAeronautical e9ngiLeanor RElmarie [id number] fundraiser RuKaryMerri CoBay State Wing Memorial Hospital And MedicAudie L. Murphy Va Hospital, Stvhc7Select Specialty HosKentuckCheLTEye SuLindley Surgcenter OHealthsouth Rehabilitation Hospital Of MoAdventhealAeronautical e66ngiLeanor RElmarie [id number] fundraiser RuKaryMerBahamas SurgAmbulatory Endoscopy Center Of MarylanKentuckCheLupita Lea561-0Marland [id number] Jfk Medical Center NorthOutpatient Surgical Specialties CCommunity Surgery CenAeronautical e35ngiLeanor RElmarie [id number] fundraiser RuKaryMerrEndocentre At QuarterfieSouthwest Medical Cente(7Laser And SurTCapital RegionLindley Ambulatory Surgical Center Of Morris CouSt. Joseph HosGarrett EAeronautical e59ngiLeanor RElmarie [id number] fundraiser RuKaryMerri WoodsonStark Ambulatory Surgery Sheridan Memorial Hospita3JaKentuckTEl Camino HLindley Arh Our [facility] Endoscopy CVa North Florida/South Georgia Healthcare System - GaAeronautical e36ngiLeanor RElmarie [id number] fundraiser RuKaryMerri NeAmbulatory Surgery CenterPrisma Health Oconee Memorial Hospita(70Siloam SprinKentuTKingwoLindley Bogalusa - Amg Specialty HMemorialcare Long Beach Medical CBanner-University Medical Center SouAeronautical e75ngiLeanor RElmarie [id number] fundraiser RuKaryMerri CorriRehabilitation Hospital Of TBascom Palmer Surgery CentKearney RegKentuckCheLupita Lea(325)8Marland [id number] StTAdvocate CondeLindley United Medical Rehabilitation HHighline South Ambulatory Surgery CHighlandAeronautical e24ngiLeanor RElmarie [id number] fundraiser RuKaryMerri NorPhoenix Children'S Hospital At Dignity Health'S MerWest Jefferson Medical Cente8RiverTJLindley The Hospitals Of Providence Horizon CityBaldwin Area MeBangor Eye SAeronautical e47ngiLeanor RElmarie [id number] fundraiser RuKaryMerrBaylor Scott & White Medical Center - MaNorth Mississippi MedicTALindley Texas Health Springwood Hospital Hurst-Euless-Memorial Health Univ Med CenDesert View Endoscopy CAeronautical e66ngiLeanor RElmarie [id number] fundraiser RuKaryMerri ChTexoma Outpatient Surgery Good Samaritan Medical Cente2PlumKentucNorth Oak RegionLindley Licking Memorial HSt. John Rehabilitation Hospital Affiliated With HealthSmokey Point BehaivoralAeronautical engiLeanor RElmarie ShiShirlee [id number] fundraiser RuKaryl6South ShoScottsdale EndoscopyNortheast Digestive Health CeAeronautical enginShirlee LaTAffiliated Endoscopy SeLindley Westside Surgery CenKindred Hospital - Delaware CSuburbanAeronautical e72ngiLeanor RElmarie [id number] fundraiser RuKaryMerrSt Josephs SurgHaven Behavioral Hospital Of FriscKenTLindley St. John'S Regional MedicalCody Regional HEndoscopy Aeronautical e63ngiLeanor RElmarie [id number] fundraiser RuKaryMerri MoMartel Eye InsBaltimore Eye Surgical Center LL(9GreenviTChristus Surgery CenLindley Jonathan M. Wainwright Memorial Va MedicalTexoma Medical CTexas Health Womens Specialty SurgeAeronautical e42ngiLeanor RElmarie [id number] fundraiser RuKaryMerri RRegency Hospital Of CincBreckinridge Memorial HospitaTLifestream Lindley Ridgeview Lesueur MedicalMount Sinai HosScott County Memorial Hospital Aka ScottAeronautical e30ngiLeanor RElmarie [id number] fundraiser RuKaryMerri NorthMayo Clinic Hlth System- FranciscHelena Surgicenter LLKentuckCheLupita Lea(903)6Marland [id number] ZHYMedical CenteTBerkshire Medical Center -Lindley Banner Baywood MedicalVirginia Mason Medical CWyckoff Heights MedicAeronautical e55ngiLeanor RElmarie [id number] fundraiser RuKaryMerri FCheyenne RiveWillough At Naples Hospita9Dreyer MediTSouth Notchietown EndoscopyLindley Ferrell Hospital Community FounSutter Lakeside HosPridAeronautical e53ngiLeanor RElmarie [id number] fundraiser RuKaryMerrThibodaux Regional MediSouth Beach PsychiatrTSelect Speciality Lindley Stringfellow Memorial HSt. Joseph'S Behavioral Health CCharleston Ent Associates LLC Dba Surgery Center Of CAeronautical e74ngiLeanor RElmarie [id number] fundraiser RuKaryMerriMills-Peninsula MediSt. Elizabeth Owe3KentuckCheLupita TPushmataha County-Town Of Antlers HLindley Northern Navajo MedicalRockcastle Regional Hospital & Respiratory Care CSheppard And Enoch PrattAeronautical e32ngiLeanor RElmarie [id number] fundraiser RuKaryMerrCedar Surgical AssInnovations Surgery Center Brook KentuckCheLupita [id number] fundraiserMerrTerrelAnne Arundel Digestive CeCheLupitTCatskill RegionLindley Park Central Surgical CenMiddle Park Medical Center-GSt FrancisAeronautical e34ngiLeanor RElmarie [id number] fundraiser RuKaryMerri HApple Hill SurgiNorth Point Surgery Center LL7MKentuckCTSt ClouLindley Lifecare Hospitals Of South [place] - McalleBahamas Surgery CFour Seasons Surgery Centers Of OAeronautical e44ngiLeanor RElmarie [id number] fundraiser RuKaryMerri SSanford Worthington Pennsylvania Eye Surgery Center In5LakKentuckCheLupTPembina County Lindley K[facility] For Rehabilitation - WestHealth CeUpper Cumberland Physicians Surgery CAeronautical e2ngiLeanor RElmarie [id number] fundraiser RuKaryMerri Lakes of the FourAdvanced Endoscopy Center Of Howard Hayes Green Beach Memorial Hospita(7Cobalt RehKentuckCheLupita Lea613 4Marland [id number]n Crescent EndosLindleyCentennial P26eakLeanor RElmarie [id number] fundraiserMeEye Laser And SurgeLaser And Surgical Services At Center For STSurgery CenLindley Beverly Oaks Physicians Surgical CenWalnut Hill Surgery CInstituto De GastroenteroloAeronautical e29ngiLeanor RElmarie [id number] fundraiser RuKaryMerri CenLake City Surgery HiLLCrest Hospital Pryo5Banner BoKentuckCheLupita TSana Behavioral HLindley Va Black Hills Healthcare System - Hot Lovelace Westside HosNorth Ms Medical CentAeronautical e62ngiLeanor RElmarie [id number] fundraiser RuKaryMerri KHosp Pavia DSpaulding Rehabilitation Hospital Cape Co(70THoward County Gastrointestinal DLindley Lincoln HFort Sutter Surgery CNorth Shore Medical Center - SalAeronautical e78ngiLeanor RElmarie [id number] fundraiser RuKaryMerri Methodist Dallas MediTopeka Surgery Cente9KentuckCheTMayo Lindley Parkview Lagrange HShore Medical CProvident Hospital Of CoAeronautical e62ngiLeanor RElmarie [id number] fundraiser RuKaryMerri QuColeman Cataract And Eye Laser Surgery Encompass Health Rehabilitation Hospital Of Virgini6ConwayTRehabilitation HospitaLindley Lovelace Regional Hospital - Advanced Endoscopy CenteAdvocate EurekaAeronautical e64ngiLeanor RElmarie [id number] fundraiser RuKaryMerriOcean Spring Surgical And EndoscTower Wound Care Center Of Santa Monica In6THca Houston HeaLindley Waverley Surgery CenPhysicians Surgical Hospital - Quail Eye Surgery Center Of AAeronautical e69ngiLeanor RElmarie [id number] fundraiser RuKaryMerri PorMemorial Hermann Surgery Center RiBaptist Memorial Hospital - North M(7Northwest KKentuckCheLuTBurke RehaLindley Va Boston Healthcare System - JamaicSt Joseph HosHammond Community Ambulatory Care CAeronautical e48ngiLeanor RElmarie [id number] fundraiser RuKaryMerriSt. Luke'S Meridian MediFairview Park Hospita(36Banner-University Medical KentucCheLupita Lea772-4TUniversity Hospitals Avon RehabiLindley Saint Josephs Hospital Of West [facility] Healthcare Rehabilitation Hospital Cane Bethesda Arrow SAeronautical e62ngiLeanor RElmarie [id number] fundraiser RuKaryMerri UniversHarrisburg MediLovelace Medical Cente4MississippKenTThedacare Medical CLindley Baylor Scott & White Medical Center - Parkwest Surgery CSage Rehabilitation Aeronautical e70ngiLeanor RElmarie [id number] fundraiser RuKaryMerri Northridge Outpatient Surgery Eastern Idaho Regional Medical Cente(78WenatKentuckCheLupita Lea(435)0Marland KitcheJXB:[id number] Hospital3Marland KitcheJX25K56112Sha807-17 

## 2021-09-26 NOTE — Consult Note (Signed)
[facility] ? ? ?Patient name: Betty Golden Admit date: 09/25/2021  ?DOB: 30-Apr-1946 LOS: 0  ?MRN: [id number] Consult date: 09/26/2021  ?Referring provider: Dr. Laural Benes, [facility] CC: Respiratory failure  ? ? ?History:  ?76 yo female former smoker fell at home.  She had two weeks of dizziness.  Felt to be from flare up of her RA and fibromyalgia.  Had decreased po intake.  After falling she was more confused and hallucinating.  CT head/neck/chest/abd were unremarkable.  She was found to have acute renal failure and leukocytosis.  She received 1 mg dilaudid for pain control.  After she become somnolent and respiratory distress with periods of apnea.  ABG showed pH 7.21, PCO2 55, PO2 196.  She required intubation and started on antibiotics for possible aspiration pneumonitis.  PCCM consulted to assist with management in ICU. ? ?Hx from medical team, chart, and family. ? ?Past medical history:  ?Rheumatoid arthritis, Asthma, C diff, Cervical spine stenosis, Chronic headaches, CAD, Depression, Diverticulosis, HTN, Fibromyalgia, GERD, HLD, IBS, CKD ? ?Significant events:  ?2/28 Admit ?3/01 intubated, started ABx, transfer to ICU ? ?Studies:  ? ? ?Micro:  ?Blood 3/01 >>  ? Lines:  ?ETT 3/01 >>  ?  ?Antibiotics:  ?Unasyn 3/01 >>  ? Consults:  ? ?  ? ?Interim history:  ?She is followed by Dr. Deanne Coffer with rheumatology and Dr. Dellis Anes for asthma. ? ?She was recently started on lyrica.  She is on MTX weekly and fasenra every 8 weeks.  She was not eating or drinking as much.  Got dizzy and fell at home.  Found to have AKI.  Received dilaudid and then got too sleep.  Had partial response to narcan.   ? ?Vital signs:  ?BP (!) 169/79 (BP Location: Left Arm)   Pulse (!) 107   Temp 98.8 ?F (37.1 ?C) (Rectal)   Resp 20   Ht  (1.626 m)   Wt 65.5 kg   SpO2 100%   BMI 24.79 kg/m?  ? Intake/output:  ?I/O last 3 completed shifts: ?In: 2477.2 [P.O.:240; I.V.:237.2; IV  Piggyback:2000] ?Out: -  ?  ?Physical exam:  ? ?General - sedated ?Eyes - pupils reactive ?ENT - ETT in place ?Cardiac - regular rate/rhythm, no murmur ?Chest - equal breath sounds b/l, no wheezing or rales ?Abdomen - soft, non tender, + bowel sounds ?Extremities - no cyanosis, clubbing, or edema ?Skin - no rashes ?Neuro - RASS -2, withdraws and grimaces to stimuli ? Best practice:  ? ?DVT - SQ heparin ?SUP - protonix ?Nutrition - tube feeds  ? ?Assessment/plan:  ? ?Acute hypoxic/hypercapnic respiratory failure. ?- likely from medications in setting of AKI ?- full vent support for now ?- f/u CXR, ABG ?- goal SpO2 > 92% ? ?Acute metabolic encephalopathy from AKI, and hypercapnia. ?- RASS goal 0 to -1 ? ?AKI from hypovolemia. ?- baseline creatinine 1.25 ?- continue IV fluids ?- f/u BMET ?- monitor urine outpt ? ?Possible aspiration pneumonitis. ?- day 1 of unasyn ? ?Hx of rheumatoid arthritis, fibromyalgia. ?- hold cymbalta, methotrexate, lyrica for now ?- followed by Dr. Deanne Coffer with rheumatology ? ?Severe, persistent asthma with eosinophilic phenotype. ?- pulmicort with prn albuterol ?- continue singulair ?- followed by Dr. Dellis Anes for fasenra injections ? ?Hypertension, Hyperlipidemia. ?- continue zocor, ASA ?- prn labetalol for SBP > 160 ? ?Resolved hospital problems:  ? ? ?Goals of care/Family discussions:  ?Code status: full code ? ?Updated family at bedside ? ?Labs:  ? ?  CMP Latest Ref Rng & Units 09/26/2021 09/25/2021 09/25/2021  ?Glucose 70 - 99 mg/dL 85 914(N100(H) 829(F115(H)  ?BUN 8 - 23 mg/dL 62(Z71(H) 30(Q70(H) 65(H72(H)  ?Creatinine 0.44 - 1.00 mg/dL 8.46(N3.00(H) 6.29(B3.48(H) 2.84(X3.93(H)  ?Sodium 135 - 145 mmol/L 134(L) 134(L) 133(L)  ?Potassium 3.5 - 5.1 mmol/L 3.8 4.0 3.1(L)  ?Chloride 98 - 111 mmol/L 99 101 96(L)  ?CO2 22 - 32 mmol/L 20(L) 18(L) 21(L)  ?Calcium 8.9 - 10.3 mg/dL 9.2 8.9 9.1  ?Total Protein 6.5 - 8.1 g/dL 7.9 - 8.4(H)  ?Total Bilirubin 0.3 - 1.2 mg/dL 0.8 - 0.8  ?Alkaline Phos 38 - 126 U/L 70 - 72  ?AST 15 - 41 U/L 112(H) -  135(H)  ?ALT 0 - 44 U/L 68(H) - 68(H)  ? ? ?CBC Latest Ref Rng & Units 09/26/2021 09/25/2021 09/25/2021  ?WBC 4.0 - 10.5 K/uL 24.5(H) 22.5(H) 20.8(H)  ?Hemoglobin 12.0 - 15.0 g/dL 11.4(L) 12.0 11.7(L)  ?Hematocrit 36.0 - 46.0 % 34.7(L) 36.0 35.1(L)  ?Platelets 150 - 400 K/uL 287 263 285  ? ? ?ABG ?   ?Component Value Date/Time  ? PHART 7.33 (L) 09/26/2021 1100  ? PCO2ART 36 09/26/2021 1100  ? PO2ART 377 (H) 09/26/2021 1100  ? HCO3 19.0 (L) 09/26/2021 1100  ? ACIDBASEDEF 6.2 (H) 09/26/2021 1100  ? O2SAT 100 09/26/2021 1100  ? ? ?CBG (last 3)  ?Recent Labs  ?  09/26/21 ?[id number] 09/26/21 ?[id number] 09/26/21 ?1147  ?GLUCAP 120* 128* 139*  ? ? ? ?Past surgical history:  ?She  has a past surgical history that includes Appendectomy (1965); Cholecystectomy (1975); Breast reduction surgery (1994); Tonsillectomy (1968); Colonoscopy (N/A, 08/19/2013); Lumbar laminectomy/decompression microdiscectomy (Bilateral, 09/20/2015); Breast biopsy; Sinus Surgery; fibromyalgia; rheumatoid arthritis; Colonoscopy (N/A, 08/26/2018); Esophagogastroduodenoscopy (N/A, 08/26/2018); and biopsy (08/26/2018). ? Social history:  ?She  reports that she quit smoking about 33 years ago. Her smoking use included cigarettes. She has a 20.00 pack-year smoking history. She has never used smokeless tobacco. She reports that she does not drink alcohol and does not use drugs. ?  ?Review of systems:  ?Unable to obtain. ? Family history:  ?Her family history includes Asthma in her brother, maternal aunt, maternal grandmother, mother, and sister; Diabetes in her maternal grandfather; Heart disease in her father, maternal grandmother, and mother; Hypertension in her brother, father, and mother; Stroke in her father and sister. ?  ? ?Medications:  ? ?No current facility-administered medications on file prior to encounter.  ? ?Current Outpatient Medications on File Prior to Encounter  ?Medication Sig  ? albuterol (PROAIR HFA) 108 (90 Base) MCG/ACT inhaler Inhale 2 puffs into the  lungs every 4 (four) hours as needed for wheezing or shortness of breath.  ? aspirin 81 MG chewable tablet Chew 1 tablet (81 mg total) by mouth daily.  ? Benralizumab (FASENRA) 30 MG/ML SOSY Inject 1 mL (30 mg total) into the skin every 8 (eight) weeks.  ? DULoxetine (CYMBALTA) 30 MG capsule Take 30 mg by mouth 2 (two) times daily.  ? fluticasone (FLONASE) 50 MCG/ACT nasal spray SPRAY 2 SPRAYS INTO EACH NOSTRIL EVERY DAY  ? Fluticasone Furoate (ARNUITY ELLIPTA) 200 MCG/ACT AEPB TAKE 1 PUFF BY MOUTH EVERY DAY  ? folic acid (FOLVITE) 1 MG tablet Take 1 mg by mouth daily.  ? losartan-hydrochlorothiazide (HYZAAR) 100-12.5 MG tablet Take 1 tablet by mouth daily.  ? methotrexate (RHEUMATREX) 2.5 MG tablet SMARTSIG:3 Tablet(s) By Mouth Once a Week  ? montelukast (SINGULAIR) 10 MG tablet Take 10 mg by mouth at bedtime.  ?  pregabalin (LYRICA) 50 MG capsule Take 50 mg by mouth 2 (two) times daily.  ? simvastatin (ZOCOR) 20 MG tablet Take 20 mg by mouth at bedtime.   ? amoxicillin-clavulanate (AUGMENTIN) 875-125 MG tablet Take 1 tablet by mouth 2 (two) times daily. (Patient not taking: Reported on 09/25/2021)  ? ENBREL SURECLICK 50 MG/ML injection Inject into the skin once a week.  ? pantoprazole (PROTONIX) 40 MG tablet Take 1 tablet (40 mg total) by mouth daily. (Patient not taking: Reported on 09/25/2021)  ?  ? ?Critical care time: 43 minutes  ?Coralyn Helling, MD ?[facility] Pulmonary/Critical Care ?Pager - [phone] ?09/26/2021, 1:32 PM ? ? ? ? ? ? ? ?

## 2021-09-26 NOTE — Assessment & Plan Note (Addendum)
Improving with IV fluid hydration  ?Foley placed for urinary retention. ?Creatinine down to 1.2.    ?

## 2021-09-26 NOTE — Progress Notes (Signed)
Initial Nutrition Assessment ? ?DOCUMENTATION CODES:  ? ?  ? ?INTERVENTION:  ?Vital High Protein @ 40 ml/hr via OGT (960 ml) ? ?Tube feeding regimen provides 960 kcal, 87 grams of protein, and 836 ml of H2O.   ? ? ?NUTRITION DIAGNOSIS:  ? ?Inadequate oral intake related to inability to eat as evidenced by NPO status. ? ? ?GOAL:  ?Provide needs based on ASPEN/SCCM guidelines ? ? ?MONITOR:  ? ?Vent status, TF tolerance, I & O's, Weight trends ? ?REASON FOR ASSESSMENT:  ? ?Consult ?Enteral/tube feeding initiation and management ? ?ASSESSMENT: Patient has a hx of CKD, IBS, GERD, CAD, RA and fibromyalgia. She presents after falling at home. Reported decreased oral intake x 2 weeks and acute renal failure. ? ?Patient is currently intubated on ventilator support after developing respiratory distress. ?MV: 8.2 L/min ?Temp (24hrs), Avg:98.4 ?F (36.9 ?C), Min:98 ?F (36.7 ?C), Max:98.8 ?F (37.1 ?C) ?MAP-80 ? ?Medications: Colace BID, Folic acid, Miralax  ? ?IVF- NS @ 75 ml/hr ? ? ?Intake/Output Summary (Last 24 hours) at 09/26/2021 1450 ?Last data filed at 09/26/2021 1200 ?Gross per 24 hour  ?Intake 2477.22 ml  ?Output 1000 ml  ?Net 1477.22 ml  ?  ? ?Weight -65.5 kg.  ?November pt weight 73.9 kg.  ? ?Labs: ?BMP Latest Ref Rng & Units 09/26/2021 09/25/2021 09/25/2021  ?Glucose 70 - 99 mg/dL 85 034(V100(H) 425(Z115(H)  ?BUN 8 - 23 mg/dL 56(L71(H) 87(F70(H) 64(P72(H)  ?Creatinine 0.44 - 1.00 mg/dL 3.29(J3.00(H) 1.88(C3.48(H) 1.66(A3.93(H)  ?BUN/Creat Ratio 12 - 28 - - -  ?Sodium 135 - 145 mmol/L 134(L) 134(L) 133(L)  ?Potassium 3.5 - 5.1 mmol/L 3.8 4.0 3.1(L)  ?Chloride 98 - 111 mmol/L 99 101 96(L)  ?CO2 22 - 32 mmol/L 20(L) 18(L) 21(L)  ?Calcium 8.9 - 10.3 mg/dL 9.2 8.9 9.1  ?  ?  ? ?NUTRITION - FOCUSED PHYSICAL EXAM: ?Unable to complete Nutrition-Focused physical exam at this time.   ? ? ?Diet Order:   ?Diet Order   ? ?       ?  Diet NPO time specified  Diet effective now       ?  ? ?  ?  ? ?  ? ? ?EDUCATION NEEDS:  ?Not appropriate for education at this time ? ?Skin:  Skin  Assessment: Reviewed RN Assessment ? ?Last BM:  unknown ? ?Height:  ? ?Ht Readings from Last 1 Encounters:  ?09/26/21 5\' 4"  (1.626 m)  ? ? ?Weight:  ? ?Wt Readings from Last 1 Encounters:  ?09/26/21 65.5 kg  ? ? ?Ideal Body Weight:   55 kg ? ?BMI:  Body mass index is 24.79 kg/m?. ? ?Estimated Nutritional Needs:  ? ?Kcal:  1323 ? ?Protein:  75-80 gr ? ?Fluid:  >1300 ml daily ? ? ?Royann ShiversLynn Perlita Forbush MS,RD,CSG,LDN ?Contact: AMION  ? ?

## 2021-09-26 NOTE — Progress Notes (Signed)
Patient brought down as a rapid response from third floor. ?7711 Narcan given, slight response ?1005 Narcan given, slight response ?Patient still seems to be somnolent, with some periods of apnea, Dr. Laural Benes at bedside. ?Anesthesia to bedside to intubated.  ?1014 Lidocaine 100mg  given ?1014 propofol 100mg  given ?1014 succinylcholine 200mg  ?1015 intubated, 7.5 ETT, 22 lip ?1015 OG tube inserted, 18 Fr ?Stat chest xray ordered. ?Consulted Dr. Craige Cotta. ?

## 2021-09-26 NOTE — Progress Notes (Signed)
Pharmacy Antibiotic Note ? ?GOLDIA BOECK is a 76 y.o. female admitted on 09/25/2021 with  aspiration pneumonia .  Pharmacy has been consulted for Unasyn dosing. ? ?Plan: ?Unasyn 3000 mg IV every 12 hours. ?Monitor labs, c/s, and patient improvement. ? ?Height: 5\' 4"  (162.6 cm) ?Weight: 65.5 kg (144 lb 6.4 oz) ?IBW/kg (Calculated) : 54.7 ? ?Temp (24hrs), Avg:98.4 ?F (36.9 ?C), Min:98 ?F (36.7 ?C), Max:98.8 ?F (37.1 ?C) ? ?Recent Labs  ?Lab 09/25/21 ?1340 09/25/21 ?1915 09/25/21 ?2009 09/26/21 ?0428  ?WBC 20.8*  --  22.5* 24.5*  ?CREATININE 3.93* 3.48*  --  3.00*  ?  ?Estimated Creatinine Clearance: 14.2 mL/min (A) (by C-G formula based on SCr of 3 mg/dL (H)).   ? ?Allergies  ?Allergen Reactions  ? Other Rash and Other (See Comments)  ? Sulfa Antibiotics Rash  ? ? ?Antimicrobials this admission: ?Unasyn 3/1 >>  ? ? ?Microbiology results: ?3/1 BCx: pending ?3/1 MRSA PCR: pending ? ?Thank you for allowing pharmacy to be a part of this patient?s care. ? ?Judeth Cornfield, PharmD ?Clinical Pharmacist ?09/26/2021 11:28 AM ? ? ?

## 2021-09-26 NOTE — Assessment & Plan Note (Addendum)
Suspected that it happened early morning 3/1 ?Intubated for airway protection ?Extubated 3/2  ?Amp/sulbactam ordered in hospital. DC on augmentin to complete course.  Continue probiotics.  ?SLP evaluated on 3/2 ?

## 2021-09-26 NOTE — Progress Notes (Signed)
OK to use OG tube per verbal from Dr Laural Benes. ?

## 2021-09-26 NOTE — Progress Notes (Signed)
Rapid Response event called this morning after patient had received 1 mg of dilaudid IV and developed increasing somnolence and respiratory distress.  Multiple doses of naloxone given however patient continued to have periods of apnea and harsh lung sounds. ABG with pH of 7.21.  Called for rapid intubation, transfer to stepdown ICU, I suspect patient aspirated.  I updated family members who were present at bedside and I have consulted intensivist Dr. Craige Cotta who agreed to consult.    ?  ?Critical Care Procedure Note ?Authorized and Performed by: Maryln Manuel MD  ?Total Critical Care time:  70 mins ?Due to a high probability of clinically significant, life threatening deterioration, the patient required my highest level of preparedness to intervene emergently and I personally spent this critical care time directly and personally managing the patient.  This critical care time included obtaining a history; examining the patient, pulse oximetry; ordering and review of studies; arranging urgent treatment with development of a management plan; evaluation of patient's response of treatment; frequent reassessment; and discussions with other providers.  This critical care time was performed to assess and manage the high probability of imminent and life threatening deterioration that could result in multi-organ failure.  It was exclusive of separately billable procedures and treating other patients and teaching time.  ?  ?  ?C. Laural Benes, MD ?How to contact the [facility] Attending or Consulting provider 7A - 7P or covering provider during after hours 7P -7A, for this patient?  ?Check the care team in [facility] and look for a) attending/consulting TRH provider listed and b) the [facility] team listed ?Log into [email or web address] and use [place] universal password to access. If you do not have the password, please contact the hospital operator. ?Locate the [facility] provider you are looking for under [facility] and page to a number that you can be  directly reached. ?If you still have difficulty reaching the provider, please page the [facility] (Director on Call) for the Hospitalists listed on amion for assistance. ?

## 2021-09-26 NOTE — Progress Notes (Signed)
Anesthesia was called to intubate the patient for severe respiratory distress from narcotics and aspiration pneumonia. Patient was in severe respiratory distress and nursing staff and respiratory therapist mask ventilating the patient with Ambu bag. Saturation 100 % Propofol 100 mg, lidocaine 100 mg iv, succinyl choline 160 mg was given and intubated with glidescope, no fluid in pharynx. Atraumatic and attempts x 1. Vital signs stable and endorsed Dr. Wynetta Emery. ?

## 2021-09-26 NOTE — Progress Notes (Signed)
?  Transition of Care (TOC) Screening Note ? ? ?Patient Details  ?Name: Betty Golden ?Date of Birth: 08-25-1945 ? ? ?Transition of Care (TOC) CM/SW Contact:    ?Iona Beard, LCSWA ?Phone Number: ?09/26/2021, 1:11 PM ? ? ? ?Transition of Care Department [facility]) has reviewed patient and no TOC needs have been identified at this time. We will continue to monitor patient advancement through interdisciplinary progression rounds. If new patient transition needs arise, please place a TOC consult. ?  ?

## 2021-09-26 NOTE — Progress Notes (Signed)
Patient daughter stated that patient has not voided in a long time. Bladder scan done with result of >847ml in bladder. Dr Laural Benes made aware. IN and out cath done with 1,073ml output noted. PRN Versed given due to patient being restless before in and out cath with good relief. Urine noted to have strong odor, clear yellow in color. Dr Laural Benes made aware of output and odor. Urine sample obtained and sent to lab per orders. Will continue to monitor.  ?

## 2021-09-27 ENCOUNTER — Inpatient Hospital Stay (HOSPITAL_COMMUNITY): Payer: Medicare Other

## 2021-09-27 DIAGNOSIS — R4182 Altered mental status, unspecified: Secondary | ICD-10-CM

## 2021-09-27 DIAGNOSIS — E440 Moderate protein-calorie malnutrition: Secondary | ICD-10-CM | POA: Diagnosis present

## 2021-09-27 DIAGNOSIS — J9601 Acute respiratory failure with hypoxia: Secondary | ICD-10-CM | POA: Diagnosis present

## 2021-09-27 DIAGNOSIS — N179 Acute kidney failure, unspecified: Secondary | ICD-10-CM

## 2021-09-27 DIAGNOSIS — R338 Other retention of urine: Secondary | ICD-10-CM

## 2021-09-27 LAB — COMPREHENSIVE METABOLIC PANEL
ALT: 47 U/L — ABNORMAL HIGH (ref 0–44)
AST: 50 U/L — ABNORMAL HIGH (ref 15–41)
Albumin: 2.5 g/dL — ABNORMAL LOW (ref 3.5–5.0)
Alkaline Phosphatase: 67 U/L (ref 38–126)
Anion gap: 12 (ref 5–15)
BUN: 71 mg/dL — ABNORMAL HIGH (ref 8–23)
CO2: 19 mmol/L — ABNORMAL LOW (ref 22–32)
Calcium: 8.8 mg/dL — ABNORMAL LOW (ref 8.9–10.3)
Chloride: 110 mmol/L (ref 98–111)
Creatinine, Ser: 2.19 mg/dL — ABNORMAL HIGH (ref 0.44–1.00)
GFR, Estimated: 23 mL/min — ABNORMAL LOW (ref >60)
Glucose, Bld: 121 mg/dL — ABNORMAL HIGH (ref 70–99)
Potassium: 3.1 mmol/L — ABNORMAL LOW (ref 3.5–5.1)
Sodium: 141 mmol/L (ref 135–145)
Total Bilirubin: 0.4 mg/dL (ref 0.3–1.2)
Total Protein: 6.9 g/dL (ref 6.5–8.1)

## 2021-09-27 LAB — CBC
HCT: 31.4 % — ABNORMAL LOW (ref 36.0–46.0)
Hemoglobin: 9.9 g/dL — ABNORMAL LOW (ref 12.0–15.0)
MCH: 29.5 pg (ref 26.0–34.0)
MCHC: 31.5 g/dL (ref 30.0–36.0)
MCV: 93.5 fL (ref 80.0–100.0)
Platelets: 250 10*3/uL (ref 150–400)
RBC: 3.36 MIL/uL — ABNORMAL LOW (ref 3.87–5.11)
RDW: 15.6 % — ABNORMAL HIGH (ref 11.5–15.5)
WBC: 23.7 10*3/uL — ABNORMAL HIGH (ref 4.0–10.5)
nRBC: 0 % (ref 0.0–0.2)

## 2021-09-27 LAB — BLOOD GAS, ARTERIAL
Acid-base deficit: 2.1 mmol/L — ABNORMAL HIGH (ref 0.0–2.0)
Bicarbonate: 21.1 mmol/L (ref 20.0–28.0)
Drawn by: 21310
FIO2: 40 %
O2 Saturation: 100 %
Patient temperature: 36.4
pCO2 arterial: 30 mmHg — ABNORMAL LOW (ref 32–48)
pH, Arterial: 7.45 (ref 7.35–7.45)
pO2, Arterial: 145 mmHg — ABNORMAL HIGH (ref 83–108)

## 2021-09-27 LAB — MAGNESIUM: Magnesium: 2.3 mg/dL (ref 1.7–2.4)

## 2021-09-27 LAB — PHOSPHORUS: Phosphorus: 2.6 mg/dL (ref 2.5–4.6)

## 2021-09-27 LAB — GLUCOSE, CAPILLARY
Glucose-Capillary: 107 mg/dL — ABNORMAL HIGH (ref 70–99)
Glucose-Capillary: 127 mg/dL — ABNORMAL HIGH (ref 70–99)
Glucose-Capillary: 130 mg/dL — ABNORMAL HIGH (ref 70–99)
Glucose-Capillary: 118 mg/dL — ABNORMAL HIGH (ref 70–99)

## 2021-09-27 LAB — PROCALCITONIN: Procalcitonin: 5.2 ng/mL

## 2021-09-27 MED ORDER — METOPROLOL TARTRATE 25 MG PO TABS
12.5000 mg | ORAL_TABLET | Freq: Two times a day (BID) | ORAL | Status: DC
  Administered 2021-09-27: 12.5 mg via ORAL
  Filled 2021-09-27: qty 1

## 2021-09-27 MED ORDER — MONTELUKAST SODIUM 10 MG PO TABS
10.0000 mg | ORAL_TABLET | Freq: Every day | ORAL | Status: DC
  Administered 2021-09-27 – 2021-09-29 (×3): 10 mg via ORAL
  Filled 2021-09-27 (×3): qty 1

## 2021-09-27 MED ORDER — METOPROLOL TARTRATE 25 MG PO TABS
12.5000 mg | ORAL_TABLET | Freq: Two times a day (BID) | ORAL | Status: DC
  Administered 2021-09-27: 12.5 mg
  Filled 2021-09-27: qty 1

## 2021-09-27 MED ORDER — ENSURE ENLIVE PO LIQD
237.0000 mL | Freq: Two times a day (BID) | ORAL | Status: DC
  Administered 2021-09-28 – 2021-09-29 (×3): 237 mL via ORAL

## 2021-09-27 MED ORDER — NYSTATIN 100000 UNIT/ML MT SUSP
5.0000 mL | Freq: Four times a day (QID) | OROMUCOSAL | Status: DC
  Administered 2021-09-27 – 2021-09-30 (×11): 500000 [IU] via OROMUCOSAL
  Filled 2021-09-27 (×10): qty 5

## 2021-09-27 MED ORDER — FOLIC ACID 1 MG PO TABS
1.0000 mg | ORAL_TABLET | Freq: Every day | ORAL | Status: DC
  Administered 2021-09-28 – 2021-09-30 (×3): 1 mg via ORAL
  Filled 2021-09-27 (×3): qty 1

## 2021-09-27 MED ORDER — ASPIRIN 81 MG PO CHEW
81.0000 mg | CHEWABLE_TABLET | Freq: Every day | ORAL | Status: DC
  Administered 2021-09-28 – 2021-09-30 (×3): 81 mg via ORAL
  Filled 2021-09-27 (×3): qty 1

## 2021-09-27 MED ORDER — ORAL CARE MOUTH RINSE
15.0000 mL | Freq: Two times a day (BID) | OROMUCOSAL | Status: DC
  Administered 2021-09-27 – 2021-09-29 (×5): 15 mL via OROMUCOSAL

## 2021-09-27 MED ORDER — SIMVASTATIN 20 MG PO TABS
20.0000 mg | ORAL_TABLET | Freq: Every day | ORAL | Status: DC
Start: 2021-09-27 — End: 2021-09-30
  Administered 2021-09-27 – 2021-09-29 (×3): 20 mg via ORAL
  Filled 2021-09-27 (×3): qty 1

## 2021-09-27 MED ORDER — PANTOPRAZOLE SODIUM 40 MG PO TBEC
40.0000 mg | DELAYED_RELEASE_TABLET | Freq: Every day | ORAL | Status: DC
  Administered 2021-09-28 – 2021-09-30 (×3): 40 mg via ORAL
  Filled 2021-09-27 (×3): qty 1

## 2021-09-27 MED ORDER — POTASSIUM CHLORIDE 10 MEQ/100ML IV SOLN
10.0000 meq | INTRAVENOUS | Status: AC
  Administered 2021-09-27 (×5): 10 meq via INTRAVENOUS
  Filled 2021-09-27 (×5): qty 100

## 2021-09-27 NOTE — Progress Notes (Signed)
PROGRESS NOTE   Betty Golden  FXO:[id number] DOB: 1946/03/01 DOA: 09/25/2021 PCP: Exie Parody, MD   Chief Complaint  Patient presents with   Fall   Level of care: Stepdown  Brief Admission History:  76 y/o patient has history of fibromyalgia, hypertension, COPD, CKD, RA. She is followed by Dr. Allene Pyo at [facility] and has frequent visit for medication monitoring. Per daughter patient had her fibromyalgia and arthritis flareup about 2 weeks ago.  Since then patient has not been eating or drinking well.  She has also been more sedentary.  Yesterday patient had a fall, and she struck the back of her head.  Now they are noticing that patient is more confused and often hallucinating, seeing things that do not exist in the room. For these problems she presents to [facility] for evaluation.     ED Course: T 98.4, 142/64  96  RR 15 BMI 25. Na 133, K 3.1 BUN 72,m CR 3.93 (last 1.25). Alb 8.1, AST 135, ALT 68, WBC 20.8 diff pending, Hgb 11.7. CT c-spine w/o acute change, CT head - old lacunar R posterior corona radiata, second old lacunar injury no acute changes. CT Ab/Pel no acute changes, right posterior subsegmental atelectasis. In ED patient received IVF. TRH called to admit for pre-renal azotemia and metabolic derangement 2/2 dehydration  09/26/2021:  after pain medication given early morning, developed increasing somnolence.  RR called and naloxone given without significant improvement.  Pt continued having periods of apnea.  ABG pH 7.21. Rapid intubation completed for airway protection, transferred to ICU. PCCM consulted.   09/27/2021:  pt extubated to nasal cannula. SLP recommending dysphagia 3 diet.    Assessment and Plan: Aspiration pneumonia (HCC) Suspected that it happened early morning 3/1 Intubated for airway protection Extubated 3/2  Amp/sulbactam ordered  X 5 days SLP evaluated on 3/2 Dysphagia diet ordered 3/2  Acute urinary retention I/O cath, persistent requirement for I/O  cath >24 hours and foley placed 3/2   Leukocytosis Possibly secondary to pneumonia Follow procalcitonin in AM   RA (rheumatoid arthritis) (HCC) Followed closely by Dr Aryal-rhematology on aggressive immunotherapy.  Plan Continue home regimen  Fibromyalgia Followed by Rhematology. Patient has had increased symptoms but no focal findings.  Plan Continue home regimen.   Dehydration IV fluid as ordered  Acute renal failure (HCC) Slowly improving with IV fluid hydration  Foley placed for urinary retention.  Recheck renal panel in AM.   COPD (chronic obstructive pulmonary disease) (HCC) Bronchodilators as ordered Pulmonary consultation  Essential hypertension BP stable. Continue home meds including ARB. If AKI persists will hold ARB   DVT prophylaxis: [place] heparin Code Status: full  Family Communication: daughter/husband updated x 2 09/26/21 Disposition: Status is: Inpatient Remains inpatient appropriate because: ICU care required severity of illness  Consultants:  PCCM Procedures:  Intubated 09/26/21 Antimicrobials:  Unasyn 3/1>>  Subjective: Pt sedated on vent Objective: Vitals:   09/27/21 1200 09/27/21 1230 09/27/21 1300 09/27/21 1626  BP: 115/60 (!) 126/48 (!) 125/45   Pulse: 85 86 88   Resp: 18 19 19    Temp:    97.8 F (36.6 C)  TempSrc:    Oral  SpO2: 100% 100% 100%   Weight:      Height:        Intake/Output Summary (Last 24 hours) at 09/27/2021 1819 Last data filed at 09/27/2021 1811 Gross per 24 hour  Intake 3036.7 ml  Output 1550 ml  Net 1486.7 ml   [employer]  09/26/21 0100 09/26/21 1530 09/27/21 0405  Weight: 65.5 kg 66.3 kg 67.4 kg   Examination:  General exam: intubated on vent,Appears calm and comfortable  Respiratory system: rales heard bilaterally. Cardiovascular system: normal S1 & S2 heard. Tachycardic, No JVD, murmurs, rubs, gallops or clicks. No pedal edema. Gastrointestinal system: Abdomen is nondistended, soft and nontender. No  organomegaly or masses felt. Normal bowel sounds heard. Central nervous system: sedated on vent. No focal neurological deficits. Extremities: Symmetric 5 x 5 power. Skin: No rashes, lesions or ulcers. Psychiatry: sedated on vent.   Data Reviewed: I have personally reviewed following labs and imaging studies  CBC: Recent Labs  Lab 09/25/21 1340 09/25/21 2009 09/26/21 0428 09/27/21 0411  WBC 20.8* 22.5* 24.5* 23.7*  NEUTROABS  --  18.6*  --   --   HGB 11.7* 12.0 11.4* 9.9*  HCT 35.1* 36.0 34.7* 31.4*  MCV 91.2 91.1 91.8 93.5  PLT 285 263 287 250    Basic Metabolic Panel: Recent Labs  Lab 09/25/21 1340 09/25/21 1915 09/26/21 0428 09/26/21 1235 09/26/21 1655 09/27/21 0411  NA 133* 134* 134*  --   --  141  K 3.1* 4.0 3.8  --   --  3.1*  CL 96* 101 99  --   --  110  CO2 21* 18* 20*  --   --  19*  GLUCOSE 115* 100* 85  --   --  121*  BUN 72* 70* 71*  --   --  71*  CREATININE 3.93* 3.48* 3.00*  --   --  2.19*  CALCIUM 9.1 8.9 9.2  --   --  8.8*  MG  --   --   --  2.2 2.3 2.3  PHOS  --   --   --  4.5 3.2 2.6    CBG: Recent Labs  Lab 09/26/21 2012 09/27/21 0121 09/27/21 0729 09/27/21 1129 09/27/21 1623  GLUCAP 129* 107* 127* 130* 118*    Recent Results (from the past 240 hour(s))  Resp Panel by RT-PCR (Flu A&B, Covid) Nasopharyngeal Swab     Status: None   Collection Time: 09/25/21  2:28 PM   Specimen: Nasopharyngeal Swab; Nasopharyngeal(NP) swabs in vial transport medium  Result Value Ref Range Status   SARS Coronavirus 2 by RT PCR NEGATIVE NEGATIVE Final    Comment: (NOTE) SARS-CoV-2 target nucleic acids are NOT DETECTED.  The SARS-CoV-2 RNA is generally detectable in upper respiratory specimens during the acute phase of infection. The lowest concentration of SARS-CoV-2 viral copies this assay can detect is 138 copies/mL. A negative result does not preclude SARS-Cov-2 infection and should not be used as the sole basis for treatment or other patient  management decisions. A negative result may occur with  improper specimen collection/handling, submission of specimen other than nasopharyngeal swab, presence of viral mutation(s) within the areas targeted by this assay, and inadequate number of viral copies(<138 copies/mL). A negative result must be combined with clinical observations, patient history, and epidemiological information. The expected result is Negative.  Fact Sheet for Patients:  BloggerCourse.com  Fact Sheet for Healthcare Providers:  SeriousBroker.it  This test is no t yet approved or cleared by the [place] FDA and  has been authorized for detection and/or diagnosis of SARS-CoV-2 by FDA under an Emergency Use Authorization (EUA). This EUA will remain  in effect (meaning this test can be used) for the duration of the COVID-19 declaration under Section 564(b)(1) of the Act, 21 U.S.C.section 360bbb-3(b)(1), unless the authorization is  terminated  or revoked sooner.       Influenza A by PCR NEGATIVE NEGATIVE Final   Influenza B by PCR NEGATIVE NEGATIVE Final    Comment: (NOTE) The Xpert Xpress SARS-CoV-2/FLU/RSV plus assay is intended as an aid in the diagnosis of influenza from Nasopharyngeal swab specimens and should not be used as a sole basis for treatment. Nasal washings and aspirates are unacceptable for Xpert Xpress SARS-CoV-2/FLU/RSV testing.  Fact Sheet for Patients: BloggerCourse.com  Fact Sheet for Healthcare Providers: SeriousBroker.it  This test is not yet approved or cleared by the [place] FDA and has been authorized for detection and/or diagnosis of SARS-CoV-2 by FDA under an Emergency Use Authorization (EUA). This EUA will remain in effect (meaning this test can be used) for the duration of the COVID-19 declaration under Section 564(b)(1) of the Act, 21 U.S.C. section 360bbb-3(b)(1),  unless the authorization is terminated or revoked.  Performed at [facility], [address] [place], [place] [address]   MRSA Next Gen by PCR, Nasal     Status: None   Collection Time: 09/26/21 10:36 AM   Specimen: Nasal Mucosa; Nasal Swab  Result Value Ref Range Status   MRSA by PCR Next Gen NOT DETECTED NOT DETECTED Final    Comment: (NOTE) The GeneXpert MRSA Assay (FDA approved for NASAL specimens only), is one component of a comprehensive MRSA colonization surveillance program. It is not intended to diagnose MRSA infection nor to guide or monitor treatment for MRSA infections. Test performance is not FDA approved in patients less than 110 years old. Performed at [facility], [address], [place], [place] [address]   Culture, blood (routine x 2)     Status: None (Preliminary result)   Collection Time: 09/26/21 11:21 AM   Specimen: BLOOD  Result Value Ref Range Status   Specimen Description BLOOD BLOOD LEFT WRIST  Final   Special Requests   Final    BOTTLES DRAWN AEROBIC AND ANAEROBIC Blood Culture results may not be optimal due to an inadequate volume of blood received in culture bottles   Culture   Final    NO GROWTH < 24 HOURS Performed at [facility], [address], [place], [place] [address]    Report Status PENDING  Incomplete  Culture, blood (routine x 2)     Status: None (Preliminary result)   Collection Time: 09/26/21 12:35 PM   Specimen: BLOOD  Result Value Ref Range Status   Specimen Description BLOOD  Final   Special Requests NONE  Final   Culture   Final    NO GROWTH < 24 HOURS Performed at [facility], [address], [place], [place] [address]    Report Status PENDING  Incomplete     Radiology Studies: DG Chest Port 1 View  Result Date: 09/27/2021 CLINICAL DATA:  Respiratory failure EXAM: PORTABLE CHEST 1 VIEW COMPARISON:  Chest x-ray dated September 27, 2021 slide FINDINGS: Slight advancement of ET tube with tip positioned approximately 1.5 cm from the  carina. Enteric tube courses below the diaphragm. Mild scattered linear opacities which are likely due to scarring or atelectasis. Lungs otherwise clear. No large pleural effusion or pneumothorax. IMPRESSION: Slight advancement of ET tube with tip positioned approximately 1.5 cm from the carina. Electronically Signed   By: Allegra Lai M.D.   On: 09/27/2021 07:59   DG CHEST PORT 1 VIEW  Result Date: 09/26/2021 CLINICAL DATA:  Weakness/increased shortness of breath. EXAM: PORTABLE CHEST 1 VIEW COMPARISON:  February 06, 2021  FINDINGS: Low lung volumes. Bilateral streaky interstitial opacities in both lungs. Normal cardiac silhouette. Tortuosity and calcific atherosclerotic disease of the aorta. IMPRESSION: Bilateral streaky interstitial opacities, which may represent atypical/viral pneumonia. Electronically Signed   By: Ted Mcalpineobrinka  Dimitrova M.D.   On: 09/26/2021 09:46   DG Chest Port 1V same Day  Result Date: 09/26/2021 CLINICAL DATA:  Post intubation EXAM: PORTABLE CHEST 1 VIEW COMPARISON:  Portable exam at 1027 hours compared to 0932 hours FINDINGS: Tip of endotracheal tube projects 3.8 cm above carina. Nasogastric tube extends into stomach. Normal heart size, mediastinal contours, and pulmonary vascularity. Lungs clear. No pleural effusion or pneumothorax. Bones demineralized. IMPRESSION: Tube positions as above. No acute abnormalities. Electronically Signed   By: Ulyses SouthwardMark  Boles M.D.   On: 09/26/2021 10:42    Scheduled Meds:  [START ON 09/28/2021] aspirin  81 mg Oral Daily   budesonide (PULMICORT) nebulizer solution  0.5 mg Nebulization BID   Chlorhexidine Gluconate Cloth  6 each Topical Daily   [START ON 09/28/2021] feeding supplement  237 mL Oral BID BM   fluticasone  2 spray Each Nare Daily   [START ON 09/28/2021] folic acid  1 mg Oral Daily   heparin  5,000 Units Subcutaneous Q8H   mouth rinse  15 mL Mouth Rinse BID   metoprolol tartrate  12.5 mg Oral BID   montelukast  10 mg Oral QHS   nystatin  5 mL  Mouth/Throat QID   [START ON 09/28/2021] pantoprazole  40 mg Oral Daily   simvastatin  20 mg Oral QHS   Continuous Infusions:  sodium chloride 75 mL/hr at 09/27/21 1625   ampicillin-sulbactam (UNASYN) IV Stopped (09/27/21 1257)     LOS: 1 day   Critical Care Procedure Note Authorized and Performed by: Maryln Manuel. Traivon Morrical MD  Total Critical Care time:  34 mins Due to a high probability of clinically significant, life threatening deterioration, the patient required my highest level of preparedness to intervene emergently and I personally spent this critical care time directly and personally managing the patient.  This critical care time included obtaining a history; examining the patient, pulse oximetry; ordering and review of studies; arranging urgent treatment with development of a management plan; evaluation of patient's response of treatment; frequent reassessment; and discussions with other providers.  This critical care time was performed to assess and manage the high probability of imminent and life threatening deterioration that could result in multi-organ failure.  It was exclusive of separately billable procedures and treating other patients and teaching time.    Standley Dakinslanford Bich Mchaney, MD How to contact the [facility] Attending or Consulting provider 7A - 7P or covering provider during after hours 7P -7A, for this patient?  Check the care team in [facility] and look for a) attending/consulting TRH provider listed and b) the [facility] team listed Log into [email or web address] and use [place]'s universal password to access. If you do not have the password, please contact the hospital operator. Locate the [facility] provider you are looking for under [facility] and page to a number that you can be directly reached. If you still have difficulty reaching the provider, please page the [facility] (Director on Call) for the Hospitalists listed on amion for assistance.  09/27/2021, 6:19 PM

## 2021-09-27 NOTE — Progress Notes (Signed)
Patient weaned on CPAP/PS 5/5 40% FIO2 for an hr. RT performed weaning parameters on patient. Patient performed -30 NIF and FVC 1.1L. MD notified and RT received orders to extubate. RT extubated patient to 4L [place] . Patient SATs 100%, HR 94 and RR 21. RN and family at bedside, no complications noted. RT will continue to monitor. ?

## 2021-09-27 NOTE — Consult Note (Addendum)
Pulmonary and Critical Care Medicine ? ? ?Patient name: Betty Golden Admit date: 09/25/2021  ?DOB: May 17, 1946 LOS: 1  ?MRN: [id number] Consult date: 09/26/2021  ?Referring provider: Dr. Laural Benes, [facility]: Respiratory failure  ? ? ?History:  ?76 yo female former smoker fell at home.  She had two weeks of dizziness.  Felt to be from flare up of her RA and fibromyalgia.  Had decreased po intake.  After falling she was more confused and hallucinating.  CT head/neck/chest/abd were unremarkable.  She was found to have acute renal failure and leukocytosis.  She received 1 mg dilaudid for pain control.  After she become somnolent and respiratory distress with periods of apnea.  ABG showed pH 7.21, PCO2 55, PO2 196.  She required intubation and started on antibiotics for possible aspiration pneumonitis.  PCCM consulted to assist with management in ICU. ? ?Past medical history:  ?Rheumatoid arthritis, Asthma, C diff, Cervical spine stenosis, Chronic headaches, CAD, Depression, Diverticulosis, HTN, Fibromyalgia, GERD, HLD, IBS, CKD ? ?Significant events:  ?2/28 Admit ?3/01 intubated, started ABx, transfer to ICU ?3/02 extubated ? ?Studies:  ? ? ?Micro:  ?Blood 3/01 >>  ? Lines:  ?ETT 3/01 >> 3/02 ?  ?Antibiotics:  ?Unasyn 3/01 >>  ? Consults:  ? ?  ? ?Interim history:  ?Extubated earlier today.  Breathing okay.  Had coughing episode with bedside swallow assessment.   ? ?Vital signs:  ?BP (!) 125/45   Pulse 88   Temp 97.9 ?F (36.6 ?C) (Oral)   Resp 19   Ht  (1.626 m)   Wt 67.4 kg   SpO2 100%   BMI 25.51 kg/m?  ? Intake/output:  ?I/O last 3 completed shifts: ?In: 1380.1 [P.O.:240; I.V.:434.7; NG/GT:354.2; IV Piggyback:351.2] ?Out: 1800 [Urine:1800] ?  ?Physical exam:  ? ?General - alert ?Eyes - pupils reactive ?ENT - white build up on her tongue ?Cardiac - regular rate/rhythm, no murmur ?Chest - equal breath sounds b/l, no wheezing or rales ?Abdomen - soft, non tender, + bowel  sounds ?Extremities - no cyanosis, clubbing, or edema ?Skin - no rashes ?Neuro - normal strength, moves extremities, follows commands ?Psych - normal mood and behavior ? Best practice:  ? ?DVT - SQ heparin ?SUP - protonix ?Nutrition - NPO  ? ?Assessment/plan:  ? ?Acute hypoxic/hypercapnic respiratory failure. ?- likely from medications in setting of AKI ?- successfully extubated 3/02 ?- goal SpO2 > 92% ? ?AKI from hypovolemia. ?- improving ?- baseline creatinine 1.25 ?- continue IV fluids ?- f/u BMET, monitor urine outpt ? ?Possible aspiration pneumonitis. ?- continue ABx for 5 days total ? ?Hx of rheumatoid arthritis, fibromyalgia. ?- hold cymbalta, methotrexate, lyrica for now ?- followed by Dr. Deanne Coffer with rheumatology ? ?Severe, persistent asthma with eosinophilic phenotype. ?- continue pulmicort, singulair ?- prn albuterol ?- followed by Dr. Dellis Anes for fasenra injections ? ?Hypertension, Hyperlipidemia. ?- continue zocor, ASA ?- prn labetalol for SBP > 160 ? ?Thrush. ?- nystatin swish and spit ? ?Hx of GERD. ?- continue protonix ? ?Dysphagia after extubation. ?- okay to have ice chips ?- speech therapy to assess swallowing ? ?Deconditioning. ?- PT to assess ? ?Resolved hospital problems:  ?Acute metabolic encephalopathy 2nd to AKI and Hypercapnia ? ?Goals of care/Family discussions:  ?Code status: full code ? ?Updated pt's family at bedside ? ?Labs:  ? ?CMP Latest Ref Rng & Units 09/27/2021 09/26/2021 09/25/2021  ?Glucose 70 - 99 mg/dL 782(N) 85 562(Z)  ?BUN 8 - 23 mg/dL 30(Q) 65(H) 84(O)  ?Creatinine  0.44 - 1.00 mg/dL 7.51(Z) 0.01(V) 4.94(W)  ?Sodium 135 - 145 mmol/L 141 134(L) 134(L)  ?Potassium 3.5 - 5.1 mmol/L 3.1(L) 3.8 4.0  ?Chloride 98 - 111 mmol/L 110 99 101  ?CO2 22 - 32 mmol/L 19(L) 20(L) 18(L)  ?Calcium 8.9 - 10.3 mg/dL 9.6(P) 9.2 8.9  ?Total Protein 6.5 - 8.1 g/dL 6.9 7.9 -  ?Total Bilirubin 0.3 - 1.2 mg/dL 0.4 0.8 -  ?Alkaline Phos 38 - 126 U/L 67 70 -  ?AST 15 - 41 U/L 50(H) 112(H) -  ?ALT 0 - 44 U/L  47(H) 68(H) -  ? ? ?CBC Latest Ref Rng & Units 09/27/2021 09/26/2021 09/25/2021  ?WBC 4.0 - 10.5 K/uL 23.7(H) 24.5(H) 22.5(H)  ?Hemoglobin 12.0 - 15.0 g/dL 5.9(F) 11.4(L) 12.0  ?Hematocrit 36.0 - 46.0 % 31.4(L) 34.7(L) 36.0  ?Platelets 150 - 400 K/uL 250 287 263  ? ? ?ABG ?   ?Component Value Date/Time  ? PHART 7.45 09/27/2021 0330  ? PCO2ART 30 (L) 09/27/2021 0330  ? PO2ART 145 (H) 09/27/2021 0330  ? HCO3 21.1 09/27/2021 0330  ? ACIDBASEDEF 2.1 (H) 09/27/2021 0330  ? O2SAT 100 09/27/2021 0330  ? ? ?CBG (last 3)  ?Recent Labs  ?  09/27/21 ?0121 09/27/21 ?0729 09/27/21 ?1129  ?GLUCAP 107* 127* 130*  ? ? ?Signature:  ?Coralyn Helling, MD ?[facility] ?Pager - [phone] - [phone] ?09/27/2021, 2:05 PM ? ? ? ? ? ? ? ?

## 2021-09-27 NOTE — Evaluation (Signed)
Clinical/Bedside Swallow Evaluation ?Patient Details  ?Name: Betty PettyDoreen J Golden ?MRN: [id number] ?Date of Birth: 06-Mar-1946 ? ?Today's Date: 09/27/2021 ?Time: SLP Start Time (ACUTE ONLY): 1554 SLP Stop Time (ACUTE ONLY): 1618 ?SLP Time Calculation (min) (ACUTE ONLY): 24 min ? ?Past Medical History:  ?Past Medical History:  ?Diagnosis Date  ? Arthritis   ? Asthma   ? C. difficile colitis   ? Cervical stenosis of spine   ? Chronic headaches   ? Chronic neck pain   ? Coronary artery disease   ? Depression   ? Diverticulosis   ? Essential hypertension   ? Fibromyalgia   ? GERD (gastroesophageal reflux disease)   ? History of Salmonella gastroenteritis   ? HNP (herniated nucleus pulposus), lumbar   ? Hyperlipidemia   ? IBS (irritable bowel syndrome)   ? Lung nodule   ? ?Past Surgical History:  ?Past Surgical History:  ?Procedure Laterality Date  ? APPENDECTOMY  1965  ? BIOPSY  08/26/2018  ? Procedure: BIOPSY;  Surgeon: Malissa Hippoehman, Najeeb U, MD;  Location: [facility];  Service: Endoscopy;;  gastric  ? BREAST BIOPSY    ? BREAST REDUCTION SURGERY  1994  ? CHOLECYSTECTOMY  1975  ? COLONOSCOPY N/A 08/19/2013  ? Procedure: COLONOSCOPY;  Surgeon: Malissa HippoNajeeb U Rehman, MD;  Location: [facility];  Service: Endoscopy;  Laterality: N/A;  155-moved to 140 Ann to notify pt  ? COLONOSCOPY N/A 08/26/2018  ? Procedure: COLONOSCOPY;  Surgeon: Malissa Hippoehman, Najeeb U, MD;  Location: [facility];  Service: Endoscopy;  Laterality: N/A;  ? ESOPHAGOGASTRODUODENOSCOPY N/A 08/26/2018  ? Procedure: ESOPHAGOGASTRODUODENOSCOPY (EGD);  Surgeon: Malissa Hippoehman, Najeeb U, MD;  Location: [facility];  Service: Endoscopy;  Laterality: N/A;  ? fibromyalgia    ? LUMBAR LAMINECTOMY/DECOMPRESSION MICRODISCECTOMY Bilateral 09/20/2015  ? Procedure: MICRO LUMBAR DECOMPRESSION L5-S1 BILATERAL    (1 LEVEL);  Surgeon: Jene EveryJeffrey Beane, MD;  Location: [facility];  Service: Orthopedics;  Laterality: Bilateral;  ? rheumatoid arthritis    ? Sinus Surgery    ? TONSILLECTOMY  1968  ? ?HPI:  ?76 yo  female former smoker fell at home.  She had two weeks of dizziness.  Felt to be from flare up of her RA and fibromyalgia.  Had decreased po intake.  After falling she was more confused and hallucinating.  CT head/neck/chest/abd were unremarkable.  She was found to have acute renal failure and leukocytosis.  She received 1 mg dilaudid for pain control.  After she become somnolent and respiratory distress with periods of apnea.  ABG showed pH 7.21, PCO2 55, PO2 196.  She required intubation and started on antibiotics for possible aspiration pneumonitis.  PCCM consulted to assist with management in ICU.  ?  ?Assessment / Plan / Recommendation  ?Clinical Impression ? Clinical swallow evaluation completed in room with family present. Pt lives with her daughter and helps care for her. They indicate poor solid food intake for the past 2 weeks (due to "fibromyalgia flair"). Pt was intubated for one night and extubated earlier today. Oral motor examination reveals edentulous oral cavity (occasionally wears upper dentures), xerostomia, white coated tongue, and hyponasal voice quality (likely due to recent extubation and dry passages). Pt consumed ice chips, thin water via cup/straw, puree, and graham crackers. Pt without overt signs or symptoms of aspiration and no reports of globus. She does have a dry oral cavity and would benefit from moisture added to solids and/or alternate solids/liquids. Recommend D3/mech soft and thin liquids with standard aspiration and reflux  precautions, po medications whole in puree, and RN to monitor for thrush. Pt/caregiver are in agreement with plan of care. ? ?SLP Visit Diagnosis: Dysphagia, unspecified (R13.10) ?   ?Aspiration Risk ? Mild aspiration risk  ?  ?Diet Recommendation Dysphagia 3 (Mech soft);Thin liquid  ? ?Liquid Administration via: Cup;Straw ?Medication Administration: Whole meds with puree ?Supervision: Patient able to self feed;Intermittent supervision to cue for compensatory  strategies ?Compensations: Slow rate;Small sips/bites;Follow solids with liquid ?Postural Changes: Seated upright at 90 degrees;Remain upright for at least 30 minutes after po intake  ?  ?Other  Recommendations Oral Care Recommendations: Oral care BID;Staff/trained caregiver to provide oral care ?Other Recommendations: Clarify dietary restrictions   ? ?Recommendations for follow up therapy are one component of a multi-disciplinary discharge planning process, led by the attending physician.  Recommendations may be updated based on patient status, additional functional criteria and insurance authorization. ? ?Follow up Recommendations No SLP follow up  ? ? ?  ?Assistance Recommended at Discharge Set up Supervision/Assistance  ?Functional Status Assessment Patient has had a recent decline in their functional status and demonstrates the ability to make significant improvements in function in a reasonable and predictable amount of time.  ?Frequency and Duration min 2x/week  ?1 week ?  ?   ? ?Prognosis Prognosis for Safe Diet Advancement: Good  ? ?  ? ?Swallow Study   ?General Date of Onset: 09/25/21 ?HPI: 76 yo female former smoker fell at home.  She had two weeks of dizziness.  Felt to be from flare up of her RA and fibromyalgia.  Had decreased po intake.  After falling she was more confused and hallucinating.  CT head/neck/chest/abd were unremarkable.  She was found to have acute renal failure and leukocytosis.  She received 1 mg dilaudid for pain control.  After she become somnolent and respiratory distress with periods of apnea.  ABG showed pH 7.21, PCO2 55, PO2 196.  She required intubation and started on antibiotics for possible aspiration pneumonitis.  PCCM consulted to assist with management in ICU. ?Type of Study: Bedside Swallow Evaluation ?Previous Swallow Assessment: N/A ?Diet Prior to this Study: NPO ?Temperature Spikes Noted: No ?Respiratory Status: Nasal cannula ?History of Recent Intubation: Yes ?Length  of Intubations (days): 1 days ?Date extubated: 09/27/21 ?Behavior/Cognition: Alert;Cooperative;Pleasant mood ?Oral Cavity Assessment: Dry ?Oral Care Completed by SLP: Yes ?Oral Cavity - Dentition: Edentulous (Pt occasionally wears upper dentures while eating) ?Vision: Functional for self-feeding ?Self-Feeding Abilities: Able to feed self ?Patient Positioning: Upright in bed ?Baseline Vocal Quality: Normal ?Volitional Cough: Strong ?Volitional Swallow: Able to elicit  ?  ?Oral/Motor/Sensory Function Overall Oral Motor/Sensory Function: Within functional limits   ?Ice Chips Ice chips: Within functional limits ?Presentation: Spoon   ?Thin Liquid Thin Liquid: Within functional limits ?Presentation: Cup;Straw;Self Fed  ?  ?Nectar Thick Nectar Thick Liquid: Not tested   ?Honey Thick Honey Thick Liquid: Not tested   ?Puree Puree: Within functional limits ?Presentation: Self Fed;Spoon   ?Solid ? ? ?  Solid: Within functional limits ?Presentation: Self Fed  ? ?  ?Thank you, ? ?Havery Moros, CCC-SLP ?[phone] ? ?Luca Burston ?09/27/2021,4:20 PM ? ? ? ? ?

## 2021-09-27 NOTE — Progress Notes (Signed)
Nutrition Follow-up ? ?DOCUMENTATION CODES:  ? ?Non-severe (moderate) malnutrition in context of acute illness/injury ? ?INTERVENTION:  ?- Encourage adequate PO intake ?- Ensure Enlive po BID, each supplement provides 350 kcal and 20 grams of protein (pt prefers strawberry) ? ?NUTRITION DIAGNOSIS:  ? ?Moderate Malnutrition related to acute illness as evidenced by energy intake < 75% for > 7 days, mild fat depletion, mild muscle depletion. ? ?GOAL:  ? ?Patient will meet greater than or equal to 90% of their needs ? ?MONITOR:  ? ?Supplement acceptance, Labs, Weight trends, PO intake ? ?REASON FOR ASSESSMENT:  ? ?Consult ?Enteral/tube feeding initiation and management ? ?ASSESSMENT:  ? ?Patient has a hx of CKD, IBS, GERD, CAD, RA and fibromyalgia. She presents after falling at home. Reported decreased oral intake x 2 weeks and acute renal failure. ? ?Pt extubated prior to RD visit. Pt with family leaving as RD entering room, provided limited nutrition and weight history. Reports pt with poor PO intake x2 weeks despite family attempts to encourage pt to eat. Pt wears dentures which were left at home. SLP following, recommend dysphagia 3 diet.  ? ?Family reports weight of 167 lbs 2 weeks ago and endorses weight loss with a current weight of 146 lbs. Per chart review, unable to confirm weight loss within the last 2 weeks however, pt with an 8% weight loss within the last 6 months. ? ?Pt currently meets criteria for acute illness malnutrition. Suspect underlying chronic malnutrition present, however unable to confirm at this time. ? ?Medications: folvite, protonix, IV abx ? ?Labs: potassium 3.1, BUN 71, Cr 2.19, AST 50, ALT 47, ? CBG's 107-130 x 24 hours ? ?NUTRITION - FOCUSED PHYSICAL EXAM: ? ?Flowsheet Row Most Recent Value  ?Orbital Region Moderate depletion  ?Upper Arm Region Mild depletion  ?Thoracic and Lumbar Region No depletion  ?Buccal Region Mild depletion  ?Temple Region Moderate depletion  ?Clavicle Bone Region  Mild depletion  ?Clavicle and Acromion Bone Region Mild depletion  ?Scapular Bone Region Mild depletion  ?Dorsal Hand Mild depletion  ?Patellar Region Moderate depletion  ?Anterior Thigh Region Moderate depletion  ?Posterior Calf Region Moderate depletion  ?Edema (RD Assessment) None  ?Hair Reviewed  ?Eyes Reviewed  ?Mouth Other (Comment)  [poor dentition/thrush]  ?Skin Reviewed  ?Nails Reviewed  ? ?  ? ? ?Diet Order:   ?Diet Order   ? ?       ?  DIET DYS 3 Room service appropriate? Yes; Fluid consistency: Thin  Diet effective now       ?  ? ?  ?  ? ?  ? ? ?EDUCATION NEEDS:  ? ?Education needs have been addressed ? ?Skin:  Skin Assessment: Reviewed RN Assessment ? ?Last BM:  unknown ? ?Height:  ? ?Ht Readings from Last 1 Encounters:  ?09/27/21 5\' 4"  (1.626 m)  ? ? ?Weight:  ? ?Wt Readings from Last 1 Encounters:  ?09/27/21 67.4 kg  ? ? ?BMI:  Body mass index is 25.51 kg/m?. ? ?Estimated Nutritional Needs:  ? ?Kcal:  1600-1800 ? ?Protein:  80-95g ? ?Fluid:  >/=1.6L ? ?Drusilla KannerAllie Yadriel Kerrigan, RDN, LDN ?Clinical Nutrition ?

## 2021-09-27 NOTE — Progress Notes (Signed)
ABG drawn and taken to lab 

## 2021-09-28 DIAGNOSIS — M797 Fibromyalgia: Secondary | ICD-10-CM

## 2021-09-28 DIAGNOSIS — J690 Pneumonitis due to inhalation of food and vomit: Principal | ICD-10-CM

## 2021-09-28 LAB — CBC WITH DIFFERENTIAL/PLATELET
Abs Immature Granulocytes: 0.5 10*3/uL — ABNORMAL HIGH (ref 0.00–0.07)
Basophils Absolute: 0.1 10*3/uL (ref 0.0–0.1)
Basophils Relative: 0 %
Eosinophils Absolute: 0 10*3/uL (ref 0.0–0.5)
Eosinophils Relative: 0 %
HCT: 32.4 % — ABNORMAL LOW (ref 36.0–46.0)
Hemoglobin: 10.5 g/dL — ABNORMAL LOW (ref 12.0–15.0)
Immature Granulocytes: 2 %
Lymphocytes Relative: 11 %
Lymphs Abs: 2.8 10*3/uL (ref 0.7–4.0)
MCH: 30.2 pg (ref 26.0–34.0)
MCHC: 32.4 g/dL (ref 30.0–36.0)
MCV: 93.1 fL (ref 80.0–100.0)
Monocytes Absolute: 1.8 10*3/uL — ABNORMAL HIGH (ref 0.1–1.0)
Monocytes Relative: 7 %
Neutro Abs: 20.7 10*3/uL — ABNORMAL HIGH (ref 1.7–7.7)
Neutrophils Relative %: 80 %
Platelets: 313 10*3/uL (ref 150–400)
RBC: 3.48 MIL/uL — ABNORMAL LOW (ref 3.87–5.11)
RDW: 15.9 % — ABNORMAL HIGH (ref 11.5–15.5)
WBC: 25.9 10*3/uL — ABNORMAL HIGH (ref 4.0–10.5)
nRBC: 0 % (ref 0.0–0.2)

## 2021-09-28 LAB — COMPREHENSIVE METABOLIC PANEL
ALT: 34 U/L (ref 0–44)
AST: 25 U/L (ref 15–41)
Albumin: 2.5 g/dL — ABNORMAL LOW (ref 3.5–5.0)
Alkaline Phosphatase: 66 U/L (ref 38–126)
Anion gap: 9 (ref 5–15)
BUN: 60 mg/dL — ABNORMAL HIGH (ref 8–23)
CO2: 22 mmol/L (ref 22–32)
Calcium: 9.2 mg/dL (ref 8.9–10.3)
Chloride: 114 mmol/L — ABNORMAL HIGH (ref 98–111)
Creatinine, Ser: 1.73 mg/dL — ABNORMAL HIGH (ref 0.44–1.00)
GFR, Estimated: 31 mL/min — ABNORMAL LOW (ref >60)
Glucose, Bld: 109 mg/dL — ABNORMAL HIGH (ref 70–99)
Potassium: 3.5 mmol/L (ref 3.5–5.1)
Sodium: 145 mmol/L (ref 135–145)
Total Bilirubin: 0.4 mg/dL (ref 0.3–1.2)
Total Protein: 6.9 g/dL (ref 6.5–8.1)

## 2021-09-28 LAB — GLUCOSE, CAPILLARY: Glucose-Capillary: 132 mg/dL — ABNORMAL HIGH (ref 70–99)

## 2021-09-28 LAB — PROCALCITONIN: Procalcitonin: 2.54 ng/mL

## 2021-09-28 MED ORDER — METOPROLOL TARTRATE 25 MG PO TABS
25.0000 mg | ORAL_TABLET | Freq: Two times a day (BID) | ORAL | Status: DC
  Administered 2021-09-28 – 2021-09-30 (×5): 25 mg via ORAL
  Filled 2021-09-28 (×5): qty 1

## 2021-09-28 MED ORDER — AMLODIPINE BESYLATE 5 MG PO TABS
5.0000 mg | ORAL_TABLET | Freq: Every day | ORAL | Status: DC
  Administered 2021-09-28 – 2021-09-30 (×3): 5 mg via ORAL
  Filled 2021-09-28 (×3): qty 1

## 2021-09-28 MED ORDER — ACETAMINOPHEN 325 MG PO TABS
650.0000 mg | ORAL_TABLET | Freq: Four times a day (QID) | ORAL | Status: DC | PRN
  Administered 2021-09-28 – 2021-09-30 (×6): 650 mg via ORAL
  Filled 2021-09-28 (×7): qty 2

## 2021-09-28 NOTE — Progress Notes (Signed)
[facility] ? ? ?Patient name: Betty Golden Admit date: 09/25/2021  ?DOB: 17-Jul-1946 LOS: 2  ?MRN: [id number] Consult date: 09/26/2021  ?Referring provider: Dr. Laural Benes, [facility]: Respiratory failure  ? ? ?History:  ?76 yo female former smoker fell at home.  She had two weeks of dizziness.  Felt to be from flare up of her RA and fibromyalgia.  Had decreased po intake.  After falling she was more confused and hallucinating.  CT head/neck/chest/abd were unremarkable.  She was found to have acute renal failure and leukocytosis.  She received 1 mg dilaudid for pain control.  After she become somnolent and respiratory distress with periods of apnea.  ABG showed pH 7.21, PCO2 55, PO2 196.  She required intubation and started on antibiotics for possible aspiration pneumonitis.  PCCM consulted to assist with management in ICU. ? ?Past medical history:  ?Rheumatoid arthritis, Asthma, C diff, Cervical spine stenosis, Chronic headaches, CAD, Depression, Diverticulosis, HTN, Fibromyalgia, GERD, HLD, IBS, CKD ? ?Significant events:  ?2/28 Admit ?3/01 intubated, started ABx, transfer to ICU ?3/02 extubated ? ?Studies:  ? ? ?Micro:  ?Blood 3/01 >>  ? Lines:  ?ETT 3/01 >> 3/02 ?  ?Antibiotics:  ?Unasyn 3/01 >>  ? Consults:  ? ?  ? ?Interim history:  ?Had coughing spell after swallowing yesterday.  Better today.  Doesn't have much of an appetite.  Denies chest pain, abdominal pain, leg pains. ? ?Vital signs:  ?BP (!) 147/53   Pulse 76   Temp (!) 97.5 ?F (36.4 ?C) (Axillary)   Resp 16   Ht 5\' 4"  (1.626 m)   Wt 67.4 kg   SpO2 97%   BMI 25.51 kg/m?  ? Intake/output:  ?I/O last 3 completed shifts: ?In: 3126.3 [I.V.:1695.6; NG/GT:661.3; IV Piggyback:769.4] ?Out: 2250 [Urine:2250] ?  ?Physical exam:  ? ?General - alert ?Eyes - pupils reactive ?ENT - no sinus tenderness, no stridor ?Cardiac - regular rate/rhythm, no murmur ?Chest - equal breath sounds b/l, no wheezing or  rales ?Abdomen - soft, non tender, + bowel sounds ?Extremities - no cyanosis, clubbing, or edema ?Skin - no rashes ?Neuro - normal strength, moves extremities, follows commands ?Psych - normal mood and behavior ? Best practice:  ? ?DVT - SQ heparin ?SUP - protonix ?Nutrition - D3 diet  ? ?Assessment/plan:  ? ?AKI from hypovolemia. ?- continues to improve ?- baseline creatinine 1.25 ?- continue IV fluid ?- f/u BMET ? ?Possible aspiration pneumonitis. ?- day 3 of unasyn; would continue antibiotics for total of 5 days ? ?Hx of rheumatoid arthritis, fibromyalgia. ?- might be able to resume cymbalta, methotrexate soon ?- would resume lyrica at very low dose and titrate up as tolerate since this likely contributed to her altered mental status  ?- followed by Dr. Deanne Coffer with rheumatology ? ?Severe, persistent asthma with eosinophilic phenotype. ?- continue pulmicort, singulair ?- prn albuterol ?- followed by Dr. Dellis Anes for fasenra injections ? ?Hypertension, Hyperlipidemia. ?- continue zocor, ASA, norvasc, lopressor ?- prn labetalol for SBP > 160 ? ?Thrush. ?- continue nystatin ? ?Hx of GERD. ?- continue protonix ? ?Dysphagia after extubation. ?- D3 diet ? ?Anemia of critical illness. ?- f/u CBC ?- transfuse for Hb < 7 ? ?Deconditioning. ?- seen by PT ? ?PCCM will sign off.  Please call if additional help needed while she is in hospital. ? ?Resolved hospital problems:  ?Acute metabolic encephalopathy 2nd to AKI and Hypercapnia, Acute hypoxic/hypercapnic respiratory failure ? ?Goals of care/Family discussions:  ?Code  status: full code ? ?Updated pt's family at bedside ? ?Labs:  ? ?CMP Latest Ref Rng & Units 09/28/2021 09/27/2021 09/26/2021  ?Glucose 70 - 99 mg/dL 161(W109(H) 960(A121(H) 85  ?BUN 8 - 23 mg/dL 54(U60(H) 98(J71(H) 19(J71(H)  ?Creatinine 0.44 - 1.00 mg/dL 4.78(G1.73(H) 9.56(O2.19(H) 1.30(Q3.00(H)  ?Sodium 135 - 145 mmol/L 145 141 134(L)  ?Potassium 3.5 - 5.1 mmol/L 3.5 3.1(L) 3.8  ?Chloride 98 - 111 mmol/L 114(H) 110 99  ?CO2 22 - 32 mmol/L 22 19(L)  20(L)  ?Calcium 8.9 - 10.3 mg/dL 9.2 6.5(H8.8(L) 9.2  ?Total Protein 6.5 - 8.1 g/dL 6.9 6.9 7.9  ?Total Bilirubin 0.3 - 1.2 mg/dL 0.4 0.4 0.8  ?Alkaline Phos 38 - 126 U/L 66 67 70  ?AST 15 - 41 U/L 25 50(H) 112(H)  ?ALT 0 - 44 U/L 34 47(H) 68(H)  ? ? ?CBC Latest Ref Rng & Units 09/28/2021 09/27/2021 09/26/2021  ?WBC 4.0 - 10.5 K/uL 25.9(H) 23.7(H) 24.5(H)  ?Hemoglobin 12.0 - 15.0 g/dL 10.5(L) 9.9(L) 11.4(L)  ?Hematocrit 36.0 - 46.0 % 32.4(L) 31.4(L) 34.7(L)  ?Platelets 150 - 400 K/uL 313 250 287  ? ? ?ABG ?   ?Component Value Date/Time  ? PHART 7.45 09/27/2021 0330  ? PCO2ART 30 (L) 09/27/2021 0330  ? PO2ART 145 (H) 09/27/2021 0330  ? HCO3 21.1 09/27/2021 0330  ? ACIDBASEDEF 2.1 (H) 09/27/2021 0330  ? O2SAT 100 09/27/2021 0330  ? ? ?CBG (last 3)  ?Recent Labs  ?  09/27/21 ?1129 09/27/21 ?1623 09/28/21 ?1124  ?GLUCAP 130* 118* 132*  ? ? ?Signature:  ?Coralyn HellingVineet Rondle Lohse, MD ?[facility] ?Pager - [phone] ?09/28/2021, 3:11 PM ? ? ? ? ? ? ? ?

## 2021-09-28 NOTE — Progress Notes (Signed)
PROGRESS NOTE   Betty Golden  GUY:[id number] DOB: 1945/11/13 DOA: 09/25/2021 PCP: Exie Parodyorres, Michael H, MD   Chief Complaint  Patient presents with   Fall   Level of care: Telemetry  Brief Admission History:  76 y/o patient has history of fibromyalgia, hypertension, COPD, CKD, RA. She is followed by Dr. Allene PyoAvinda A[facility]al at [facility] and has frequent visit for medication monitoring. Per daughter patient had her fibromyalgia and arthritis flareup about 2 weeks ago.  Since then patient has not been eating or drinking well.  She has also been more sedentary.  Yesterday patient had a fall, and she struck the back of her head.  Now they are noticing that patient is more confused and often hallucinating, seeing things that do not exist in the room. For these problems she presents to [facility] for evaluation.     ED Course: T 98.4, 142/64  96  RR 15 BMI 25. Na 133, K 3.1 BUN 72,m CR 3.93 (last 1.25). Alb 8.1, AST 135, ALT 68, WBC 20.8 diff pending, Hgb 11.7. CT c-spine w/o acute change, CT head - old lacunar R posterior corona radiata, second old lacunar injury no acute changes. CT Ab/Pel no acute changes, right posterior subsegmental atelectasis. In ED patient received IVF. TRH called to admit for pre-renal azotemia and metabolic derangement 2/2 dehydration  09/26/2021:  after pain medication given early morning, developed increasing somnolence.  RR called and naloxone given without significant improvement.  Pt continued having periods of apnea.  ABG pH 7.21. Rapid intubation completed for airway protection, transferred to ICU. PCCM consulted.   09/27/2021:  pt extubated to nasal cannula. SLP recommending dysphagia 3 diet.   09/28/2021:  Pt having headaches from elevated BPs. She is not resting that well.  She is breathing much better.  Ambulated with PT.  WBC remains elevated.    Assessment and Plan: * AKI (acute kidney injury) (HCC) Slowly improving with IV fluid hydration  Foley placed for urinary retention.   Recheck renal panel in AM.   Acute respiratory failure with hypoxia (HCC) Secondary to presumed aspiration Briefly intubated but now extubated and continuing to improve.   Aspiration pneumonia (HCC) Suspected that it happened early morning 3/1 Intubated for airway protection Extubated 3/2  Amp/sulbactam ordered  X 5 days SLP evaluated on 3/2 Dysphagia diet ordered 3/2  Acute urinary retention I/O cath, persistent requirement for I/O cath >24 hours and foley placed 3/2 Plan to try removing foley 3/4 and doing a voiding trial.     Essential hypertension BP stable. Continue home meds including ARB.  Added metoprolol, increasing to 25 mg BID due to elevated BPs Added amlodipine 5 mg daily 3/3 due to persistent elevated BPs.  Add hydralazine IV PRN SBP>160  Malnutrition of moderate degree Dietitian consultation for recommendations requested.   RA (rheumatoid arthritis) (HCC) Followed closely by Dr Aryal-rhematology on aggressive immunotherapy.  Plan Continue home regimen  Dehydration Improving on IV fluid as ordered  COPD (chronic obstructive pulmonary disease) (HCC) Bronchodilators as ordered Pulmonary consultation  Leukocytosis Smear did not show abnormal morphology.  Follow procalcitonin in AM   Fibromyalgia Followed by Rhematology. Patient has had increased symptoms but no focal findings.  Plan Continue home regimen.    DVT prophylaxis: [place] heparin Code Status: full  Family Communication: daughter/husband updated x 2 09/26/21 Disposition: Status is: Inpatient Remains inpatient appropriate because: ICU care required severity of illness  Consultants:  PCCM Procedures:  Intubated 09/26/21 Extubated 09/27/21 Antimicrobials:  Unasyn 3/1>>  Subjective:  Pt now extubated and vocalizing.   Objective: Vitals:   09/28/21 0700 09/28/21 0800 09/28/21 0938 09/28/21 1200  BP: (!) 164/99 (!) 147/53    Pulse: 72 76    Resp: 18 16    Temp:    (!) 97.5 F (36.4 C)   TempSrc:    Axillary  SpO2:   97%   Weight:      Height:        Intake/Output Summary (Last 24 hours) at 09/28/2021 1436 Last data filed at 09/28/2021 0800 Gross per 24 hour  Intake 2498.34 ml  Output 1000 ml  Net 1498.34 ml   Filed Weights   09/26/21 0100 09/26/21 1530 09/27/21 0405  Weight: 65.5 kg 66.3 kg 67.4 kg   Examination:  General exam: awake, alert, cooperative, NAD, appears weak, chronically ill, Appears calm and comfortable  Respiratory system: rales heard bilaterally. Cardiovascular system: normal S1 & S2 heard. Tachycardic, No JVD, murmurs, rubs, gallops or clicks. No pedal edema. Gastrointestinal system: Abdomen is nondistended, soft and nontender. No organomegaly or masses felt. Normal bowel sounds heard. Central nervous system:  No focal neurological deficits. Extremities: Symmetric 5 x 5 power. Skin: No rashes, lesions or ulcers. Psychiatry: awake, alert, flat affect noted.   Data Reviewed: I have personally reviewed following labs and imaging studies  CBC: Recent Labs  Lab 09/25/21 1340 09/25/21 2009 09/26/21 0428 09/27/21 0411 09/28/21 0429  WBC 20.8* 22.5* 24.5* 23.7* 25.9*  NEUTROABS  --  18.6*  --   --  20.7*  HGB 11.7* 12.0 11.4* 9.9* 10.5*  HCT 35.1* 36.0 34.7* 31.4* 32.4*  MCV 91.2 91.1 91.8 93.5 93.1  PLT 285 263 287 250 313    Basic Metabolic Panel: Recent Labs  Lab 09/25/21 1340 09/25/21 1915 09/26/21 0428 09/26/21 1235 09/26/21 1655 09/27/21 0411 09/28/21 0429  NA 133* 134* 134*  --   --  141 145  K 3.1* 4.0 3.8  --   --  3.1* 3.5  CL 96* 101 99  --   --  110 114*  CO2 21* 18* 20*  --   --  19* 22  GLUCOSE 115* 100* 85  --   --  121* 109*  BUN 72* 70* 71*  --   --  71* 60*  CREATININE 3.93* 3.48* 3.00*  --   --  2.19* 1.73*  CALCIUM 9.1 8.9 9.2  --   --  8.8* 9.2  MG  --   --   --  2.2 2.3 2.3  --   PHOS  --   --   --  4.5 3.2 2.6  --     CBG: Recent Labs  Lab 09/27/21 0121 09/27/21 0729 09/27/21 1129 09/27/21 1623  09/28/21 1124  GLUCAP 107* 127* 130* 118* 132*    Recent Results (from the past 240 hour(s))  Resp Panel by RT-PCR (Flu A&B, Covid) Nasopharyngeal Swab     Status: None   Collection Time: 09/25/21  2:28 PM   Specimen: Nasopharyngeal Swab; Nasopharyngeal(NP) swabs in vial transport medium  Result Value Ref Range Status   SARS Coronavirus 2 by RT PCR NEGATIVE NEGATIVE Final    Comment: (NOTE) SARS-CoV-2 target nucleic acids are NOT DETECTED.  The SARS-CoV-2 RNA is generally detectable in upper respiratory specimens during the acute phase of infection. The lowest concentration of SARS-CoV-2 viral copies this assay can detect is 138 copies/mL. A negative result does not preclude SARS-Cov-2 infection and should not be used as the sole basis for  treatment or other patient management decisions. A negative result may occur with  improper specimen collection/handling, submission of specimen other than nasopharyngeal swab, presence of viral mutation(s) within the areas targeted by this assay, and inadequate number of viral copies(<138 copies/mL). A negative result must be combined with clinical observations, patient history, and epidemiological information. The expected result is Negative.  Fact Sheet for Patients:  BloggerCourse.com  Fact Sheet for Healthcare Providers:  SeriousBroker.it  This test is no t yet approved or cleared by the [place] FDA and  has been authorized for detection and/or diagnosis of SARS-CoV-2 by FDA under an Emergency Use Authorization (EUA). This EUA will remain  in effect (meaning this test can be used) for the duration of the COVID-19 declaration under Section 564(b)(1) of the Act, 21 U.S.C.section 360bbb-3(b)(1), unless the authorization is terminated  or revoked sooner.       Influenza A by PCR NEGATIVE NEGATIVE Final   Influenza B by PCR NEGATIVE NEGATIVE Final    Comment: (NOTE) The Xpert  Xpress SARS-CoV-2/FLU/RSV plus assay is intended as an aid in the diagnosis of influenza from Nasopharyngeal swab specimens and should not be used as a sole basis for treatment. Nasal washings and aspirates are unacceptable for Xpert Xpress SARS-CoV-2/FLU/RSV testing.  Fact Sheet for Patients: BloggerCourse.com  Fact Sheet for Healthcare Providers: SeriousBroker.it  This test is not yet approved or cleared by the [place] FDA and has been authorized for detection and/or diagnosis of SARS-CoV-2 by FDA under an Emergency Use Authorization (EUA). This EUA will remain in effect (meaning this test can be used) for the duration of the COVID-19 declaration under Section 564(b)(1) of the Act, 21 U.S.C. section 360bbb-3(b)(1), unless the authorization is terminated or revoked.  Performed at [facility], [address], [place], [place] [address]   MRSA Next Gen by PCR, Nasal     Status: None   Collection Time: 09/26/21 10:36 AM   Specimen: Nasal Mucosa; Nasal Swab  Result Value Ref Range Status   MRSA by PCR Next Gen NOT DETECTED NOT DETECTED Final    Comment: (NOTE) The GeneXpert MRSA Assay (FDA approved for NASAL specimens only), is one component of a comprehensive MRSA colonization surveillance program. It is not intended to diagnose MRSA infection nor to guide or monitor treatment for MRSA infections. Test performance is not FDA approved in patients less than 42 years old. Performed at [facility], [address], [place], [place] [address]   Culture, blood (routine x 2)     Status: None (Preliminary result)   Collection Time: 09/26/21 11:21 AM   Specimen: BLOOD  Result Value Ref Range Status   Specimen Description BLOOD BLOOD LEFT WRIST  Final   Special Requests   Final    BOTTLES DRAWN AEROBIC AND ANAEROBIC Blood Culture results may not be optimal due to an inadequate volume of blood received in culture bottles   Culture    Final    NO GROWTH 2 DAYS Performed at [facility], [address] [place], [place] [address]    Report Status PENDING  Incomplete  Culture, blood (routine x 2)     Status: None (Preliminary result)   Collection Time: 09/26/21 12:35 PM   Specimen: BLOOD  Result Value Ref Range Status   Specimen Description BLOOD  Final   Special Requests NONE  Final   Culture   Final    NO GROWTH 2 DAYS Performed at [facility], [address] [place], [place] [address]  Report Status PENDING  Incomplete     Radiology Studies: DG Chest Port 1 View  Result Date: 09/27/2021 CLINICAL DATA:  Respiratory failure EXAM: PORTABLE CHEST 1 VIEW COMPARISON:  Chest x-ray dated September 27, 2021 slide FINDINGS: Slight advancement of ET tube with tip positioned approximately 1.5 cm from the carina. Enteric tube courses below the diaphragm. Mild scattered linear opacities which are likely due to scarring or atelectasis. Lungs otherwise clear. No large pleural effusion or pneumothorax. IMPRESSION: Slight advancement of ET tube with tip positioned approximately 1.5 cm from the carina. Electronically Signed   By: Allegra Lai M.D.   On: 09/27/2021 07:59    Scheduled Meds:  amLODipine  5 mg Oral Daily   aspirin  81 mg Oral Daily   budesonide (PULMICORT) nebulizer solution  0.5 mg Nebulization BID   Chlorhexidine Gluconate Cloth  6 each Topical Daily   feeding supplement  237 mL Oral BID BM   fluticasone  2 spray Each Nare Daily   folic acid  1 mg Oral Daily   heparin  5,000 Units Subcutaneous Q8H   mouth rinse  15 mL Mouth Rinse BID   metoprolol tartrate  25 mg Oral BID   montelukast  10 mg Oral QHS   nystatin  5 mL Mouth/Throat QID   pantoprazole  40 mg Oral Daily   simvastatin  20 mg Oral QHS   Continuous Infusions:  sodium chloride 60 mL/hr at 09/28/21 0800   ampicillin-sulbactam (UNASYN) IV 3 g (09/28/21 1215)     LOS: 2 days   Time spent: 35 mins   Ouida Abeyta Laural Benes, MD How to contact the [facility]  Attending or Consulting provider 7A - 7P or covering provider during after hours 7P -7A, for this patient?  Check the care team in [facility] and look for a) attending/consulting TRH provider listed and b) the [facility] team listed Log into [email or web address] and use [place]'s universal password to access. If you do not have the password, please contact the hospital operator. Locate the [facility] provider you are looking for under [facility] and page to a number that you can be directly reached. If you still have difficulty reaching the provider, please page the [facility] (Director on Call) for the Hospitalists listed on amion for assistance.  09/28/2021, 2:36 PM

## 2021-09-28 NOTE — TOC Initial Note (Signed)
Transition of Care (TOC) - Initial/Assessment Note  ? ? ?Patient Details  ?Name: Betty Golden ?MRN: [id number] ?Date of Birth: 26-Oct-1945 ? ?Transition of Care (TOC) CM/SW Contact:    ?Shade Flood, LCSW ?Phone Number: ?09/28/2021, 12:50 PM ? ?Clinical Narrative:                 ? ?PT recommending HH PT at dc. Met with pt and dtr at bedside today. Pt sleeping at time of visit. Pt's daughter states that she and pt live together. Pt has a cane, walker, w/c, BSC, and a shower with built in seat and hand held sprayer for DME. Pt's daughter assists as needed with ADLs. She reports plan to transport pt home by car at dc. ? ?Discussed HH recommendation and pt/dtr agreeable. CMS provider options reviewed and referred as requested. MD anticipating dc Monday. ? ?TOC will follow. ? ?Expected Discharge Plan: [place] ?Barriers to Discharge: Continued Medical Work up ? ? ?Patient Goals and CMS Choice ?Patient states their goals for this hospitalization and ongoing recovery are:: go home ?CMS Medicare.gov Compare Post Acute Care list provided to:: Patient Represenative (must comment) ?Choice offered to / list presented to : Adult Children ? ?Expected Discharge Plan and Services ?Expected Discharge Plan: [place] ?In-house Referral: Clinical Social Work ?  ?Post Acute Care Choice: Home Health ?Living arrangements for the past 2 months: [place] ?                ?  ?  ?  ?  ?  ?HH Arranged: Therapist, sports, PT ?[place] Agency: [place] ?Date HH Agency Contacted: 09/28/21 ?  ?Representative spoke with at [place]: Tommi Rumps ? ?Prior Living Arrangements/Services ?Living arrangements for the past 2 months: [place] ?Lives with:: Adult Children ?Patient language and need for interpreter reviewed:: Yes ?Do you feel safe going back to the place where you live?: Yes      ?Need for Family Participation in Patient Care: Yes (Comment) ?Care giver support system in place?: Yes  (comment) ?Current home services: DME ?Criminal Activity/Legal Involvement Pertinent to Current Situation/Hospitalization: No - Comment as needed ? ?Activities of Daily Living ?Home Assistive Devices/Equipment: Gilford Rile (specify type), Cane (specify quad or straight) ?ADL Screening (condition at time of admission) ?Patient's cognitive ability adequate to safely complete daily activities?: Yes ?Is the patient deaf or have difficulty hearing?: No ?Does the patient have difficulty seeing, even when wearing glasses/contacts?: No ?Does the patient have difficulty concentrating, remembering, or making decisions?: Yes ?Patient able to express need for assistance with ADLs?: Yes ?Does the patient have difficulty dressing or bathing?: Yes ?Independently performs ADLs?: No ?Does the patient have difficulty walking or climbing stairs?: Yes ?Weakness of Legs: Both ?Weakness of Arms/Hands: None ? ?Permission Sought/Granted ?Permission sought to share information with : Customer service manager ?Permission granted to share information with : Yes, Verbal Permission Granted ?   ? Permission granted to share info w AGENCY: HH ?   ?   ? ?Emotional Assessment ?Appearance:: Appears stated age ?  ?  ?Orientation: : Oriented to Self, Oriented to Place, Oriented to  Time, Oriented to Situation ?Alcohol / Substance Use: Not Applicable ?Psych Involvement: No (comment) ? ?Admission diagnosis:  Uremia [N19] ?AKI (acute kidney injury) ([place]) [N17.9] ?Altered mental status, unspecified altered mental status type [R41.82] ?Patient Active Problem List  ? Diagnosis Date Noted  ? Malnutrition of moderate degree 09/27/2021  ? Acute respiratory failure  with hypoxia ([place])   ? Acute urinary retention 09/26/2021  ? Aspiration pneumonia ([place]) 09/26/2021  ? RA (rheumatoid arthritis) ([place]) 09/25/2021  ? Leukocytosis 09/25/2021  ? Viral pneumonia 04/20/2019  ? TIA (transient ischemic attack) 04/16/2019  ? Dyspnea 04/04/2019  ? COVID-19 virus infection  04/04/2019  ? Gastritis and gastroduodenitis 07/08/2018  ? Rectal bleeding 06/29/2018  ? Fibromyalgia 09/08/2017  ? Spinal stenosis of lumbar region 09/20/2015  ? 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 Moderate persistent asthma 07/03/2015  ? Allergic rhinitis 07/03/2015  ? Colitis due to Clostridium difficile 02/10/2015  ? Abdominal pain 02/10/2015  ? Nausea and vomiting 02/10/2015  ? AKI (acute kidney injury) ([place]) 02/10/2015  ? Dehydration 02/10/2015  ? Hypoxia 02/10/2015  ? Hyponatremia 02/10/2015  ? Hypokalemia 02/10/2015  ? COPD (chronic obstructive pulmonary disease) ([place])   ? Unspecified constipation 07/15/2013  ? DYSPNEA 04/05/2008  ? Essential hypertension 03/10/2008  ? Headache(784.0) 03/10/2008  ? Cough 03/10/2008  ? ?PCP:  Normand Sloop, MD ?Pharmacy:   ?[id number] - [place], [place] ?[place] ?[place] [address] ?Phone: [phone] Fax: [phone] ? ?[facility] - [place], [place] ?[place] [address] ?Phone: [phone] Fax: [phone] ? ? ? ? ?Social Determinants of Health (SDOH) Interventions ?  ? ?Readmission Risk Interventions ?No flowsheet data found. ? ? ?

## 2021-09-28 NOTE — Plan of Care (Signed)
?  Problem: Acute Rehab PT Goals(only PT should resolve) ?Goal: Pt Will Go Supine/Side To Sit ?Outcome: Progressing ?Flowsheets (Taken 09/28/2021 1202) ?Pt will go Supine/Side to Sit: ? with supervision ? with min guard assist ?Goal: Patient Will Transfer Sit To/From Stand ?Outcome: Progressing ?Flowsheets (Taken 09/28/2021 1202) ?Patient will transfer sit to/from stand: ? with supervision ? with min guard assist ?Goal: Pt Will Transfer Bed To Chair/Chair To Bed ?Outcome: Progressing ?Flowsheets (Taken 09/28/2021 1202) ?Pt will Transfer Bed to Chair/Chair to Bed: ? with supervision ? min guard assist ?Goal: Pt Will Ambulate ?Outcome: Progressing ?Flowsheets (Taken 09/28/2021 1202) ?Pt will Ambulate: ? 50 feet ? with min guard assist ? with minimal assist ? with rolling walker ?  ?12:04 PM, 09/28/21 ?Ocie Bob, MPT ?Physical Therapist with [place] ?[facility] ?[phone] office ?[phone] mobile phone ? ?

## 2021-09-28 NOTE — Evaluation (Signed)
Physical Therapy Evaluation ?Patient Details ?Name: Betty Golden ?MRN: [id number] ?DOB: 03-21-1946 ?Today's Date: 09/28/2021 ? ?History of Present Illness ? 76 y/o patient has history of fibromyalgia, hypertension, COPD, CKD, RA. She is followed by Dr. Georgette Dover at [facility] and has frequent visit for medication monitoring. Per daughter patient had her fibromyalgia and arthritis flareup about 2 weeks ago.  Since then patient has not been eating or drinking well.  She has also been more sedentary.  Yesterday patient had a fall, and she struck the back of her head.  Now they are noticing that patient is more confused and often hallucinating, seeing things that do not exist in the room. For these problems she presents to [facility] for evaluation. ?  ?Clinical Impression ? Patient demonstrates slow labored movement for rolling to side and sitting up from side lying position with Min/mod assist, fair/good return for ambulating room and transferring to chair and to commode without loss of balance, but limited due to fatigue and generalized weakness.  Patient left sitting on commode to have a bowel movement - nursing staff aware.  Patient will benefit from continued skilled physical therapy in hospital and recommended venue below to increase strength, balance, endurance for safe ADLs and gait.  ?   ?   ? ?Recommendations for follow up therapy are one component of a multi-disciplinary discharge planning process, led by the attending physician.  Recommendations may be updated based on patient status, additional functional criteria and insurance authorization. ? ?Follow Up Recommendations Home health PT ? ?  ?Assistance Recommended at Discharge Set up Supervision/Assistance  ?Patient can return home with the following ? A little help with walking and/or transfers;A little help with bathing/dressing/bathroom;Help with stairs or ramp for entrance;Assistance with cooking/housework ? ?  ?Equipment Recommendations None recommended by PT   ?Recommendations for Other Services ?    ?  ?Functional Status Assessment Patient has had a recent decline in their functional status and demonstrates the ability to make significant improvements in function in a reasonable and predictable amount of time.  ? ?  ?Precautions / Restrictions Precautions ?Precautions: Fall ?Restrictions ?Weight Bearing Restrictions: No  ? ?  ? ?Mobility ? Bed Mobility ?Overal bed mobility: Needs Assistance ?Bed Mobility: Rolling, Sidelying to Sit ?Rolling: Min guard, Min assist ?Sidelying to sit: Min assist ?  ?  ?  ?General bed mobility comments: increased time, labored movement ?  ? ?Transfers ?Overall transfer level: Needs assistance ?Equipment used: Rolling walker (2 wheels) ?Transfers: Sit to/from Stand, Bed to chair/wheelchair/BSC ?Sit to Stand: Min assist ?  ?Step pivot transfers: Min assist ?  ?  ?  ?General transfer comment: increased time, labored movement ?  ? ?Ambulation/Gait ?Ambulation/Gait assistance: Min assist, Mod assist ?Gait Distance (Feet): 12 Feet ?Assistive device: Rolling walker (2 wheels) ?Gait Pattern/deviations: Decreased step length - right, Decreased step length - left, Decreased stride length ?Gait velocity: decreased ?  ?  ?General Gait Details: slow labored slightly unsteady cadence and limited mostly due to fatigue & generalized weakness ? ?Stairs ?  ?  ?  ?  ?  ? ?Wheelchair Mobility ?  ? ?Modified Rankin (Stroke Patients Only) ?  ? ?  ? ?Balance Overall balance assessment: Needs assistance ?Sitting-balance support: Feet supported, No upper extremity supported ?Sitting balance-Leahy Scale: Fair ?Sitting balance - Comments: fair/good seated at EOB ?  ?Standing balance support: During functional activity, Bilateral upper extremity supported ?Standing balance-Leahy Scale: Fair ?Standing balance comment: using RW ?  ?  ?  ?  ?  ?  ?  ?  ?  ?  ?  ?   ? ? ? ?  Pertinent Vitals/Pain Pain Assessment ?Pain Assessment: Faces ?Faces Pain Scale: Hurts a little  bit ?Pain Location: headache and stomach ?Pain Descriptors / Indicators: Headache, Discomfort ?Pain Intervention(s): Limited activity within patient's tolerance, Monitored during session, Repositioned  ? ? ?Home Living Family/patient expects to be discharged to:: Private residence ?Living Arrangements: Children ?Available Help at Discharge: Family;Available 24 hours/day ?Type of Home: House ?Home Access: Ramped entrance ?  ?  ?Alternate Level Stairs-Number of Steps: patient does not go upstair or to basement ?Home Layout: Two level;Able to live on main level with bedroom/bathroom;Full bath on main level ?Home Equipment: Conservation officer, nature (2 wheels);Wheelchair - manual;Cane - single point;BSC/3in1 ?   ?  ?Prior Function Prior Level of Function : Needs assist ?  ?  ?  ?Physical Assist : Mobility (physical);ADLs (physical) ?Mobility (physical): Bed mobility;Transfers;Gait;Stairs ?  ?Mobility Comments: household ambulator using RW ?ADLs Comments: assisted by family ?  ? ? ?Hand Dominance  ? Dominant Hand: Right ? ?  ?Extremity/Trunk Assessment  ? Upper Extremity Assessment ?Upper Extremity Assessment: Generalized weakness ?  ? ?Lower Extremity Assessment ?Lower Extremity Assessment: Generalized weakness ?  ? ?Cervical / Trunk Assessment ?Cervical / Trunk Assessment: Kyphotic  ?Communication  ? Communication: No difficulties  ?Cognition Arousal/Alertness: Awake/alert ?Behavior During Therapy: [facility] for tasks assessed/performed ?Overall Cognitive Status: Within Functional Limits for tasks assessed ?  ?  ?  ?  ?  ?  ?  ?  ?  ?  ?  ?  ?  ?  ?  ?  ?  ?  ?  ? ?  ?General Comments   ? ?  ?Exercises    ? ?Assessment/Plan  ?  ?PT Assessment Patient needs continued PT services  ?PT Problem List Decreased strength;Decreased activity tolerance;Decreased balance;Decreased mobility ? ?   ?  ?PT Treatment Interventions DME instruction;Gait training;Stair training;Functional mobility training;Therapeutic activities;Therapeutic  exercise;Patient/family education;Balance training   ? ?PT Goals (Current goals can be found in the Care Plan section)  ?Acute Rehab PT Goals ?Patient Stated Goal: return home with family to assist ?PT Goal Formulation: With patient ?Time For Goal Achievement: 10/05/21 ?Potential to Achieve Goals: Good ? ?  ?Frequency Min 3X/week ?  ? ? ?Co-evaluation   ?  ?  ?  ?  ? ? ?  ?AM-PAC PT "6 Clicks" Mobility  ?Outcome Measure Help needed turning from your back to your side while in a flat bed without using bedrails?: A Little ?Help needed moving from lying on your back to sitting on the side of a flat bed without using bedrails?: A Lot ?Help needed moving to and from a bed to a chair (including a wheelchair)?: A Little ?Help needed standing up from a chair using your arms (e.g., wheelchair or bedside chair)?: A Little ?Help needed to walk in hospital room?: A Lot ?Help needed climbing 3-5 steps with a railing? : A Lot ?6 Click Score: 15 ? ?  ?End of Session   ?Activity Tolerance: Patient tolerated treatment well;Patient limited by fatigue ?Patient left: in chair;with call bell/phone within reach ?Nurse Communication: Mobility status ?PT Visit Diagnosis: Unsteadiness on feet (R26.81);Other abnormalities of gait and mobility (R26.89);Muscle weakness (generalized) (M62.81) ?  ? ?Time: [id number] ?PT Time Calculation (min) (ACUTE ONLY): 31 min ? ? ?Charges:   PT Evaluation ?$PT Eval Moderate Complexity: 1 Mod ?PT Treatments ?$Therapeutic Activity: 23-37 mins ?  ?   ? ? ?12:01 PM, 09/28/21 ?Lonell Grandchild, MPT ?Physical Therapist with [place] ?[facility] ?[phone] office ?[place]  mobile phone ? ? ?

## 2021-09-28 NOTE — Assessment & Plan Note (Signed)
Dietitian consultation for recommendations requested.  ?

## 2021-09-28 NOTE — Assessment & Plan Note (Signed)
Secondary to presumed aspiration ?Briefly intubated but now extubated and continuing to improve.  ?

## 2021-09-29 LAB — CBC WITH DIFFERENTIAL/PLATELET
Abs Immature Granulocytes: 0.51 10*3/uL — ABNORMAL HIGH (ref 0.00–0.07)
Basophils Absolute: 0.1 10*3/uL (ref 0.0–0.1)
Basophils Relative: 0 %
Eosinophils Absolute: 0 10*3/uL (ref 0.0–0.5)
Eosinophils Relative: 0 %
HCT: 30 % — ABNORMAL LOW (ref 36.0–46.0)
Hemoglobin: 9.9 g/dL — ABNORMAL LOW (ref 12.0–15.0)
Immature Granulocytes: 2 %
Lymphocytes Relative: 19 %
Lymphs Abs: 4.4 10*3/uL — ABNORMAL HIGH (ref 0.7–4.0)
MCH: 30.2 pg (ref 26.0–34.0)
MCHC: 33 g/dL (ref 30.0–36.0)
MCV: 91.5 fL (ref 80.0–100.0)
Monocytes Absolute: 1.6 10*3/uL — ABNORMAL HIGH (ref 0.1–1.0)
Monocytes Relative: 7 %
Neutro Abs: 16.8 10*3/uL — ABNORMAL HIGH (ref 1.7–7.7)
Neutrophils Relative %: 72 %
Platelets: 296 10*3/uL (ref 150–400)
RBC: 3.28 MIL/uL — ABNORMAL LOW (ref 3.87–5.11)
RDW: 15.7 % — ABNORMAL HIGH (ref 11.5–15.5)
WBC: 23.4 10*3/uL — ABNORMAL HIGH (ref 4.0–10.5)
nRBC: 0 % (ref 0.0–0.2)

## 2021-09-29 LAB — BASIC METABOLIC PANEL
Anion gap: 10 (ref 5–15)
BUN: 40 mg/dL — ABNORMAL HIGH (ref 8–23)
CO2: 24 mmol/L (ref 22–32)
Calcium: 9.1 mg/dL (ref 8.9–10.3)
Chloride: 109 mmol/L (ref 98–111)
Creatinine, Ser: 1.55 mg/dL — ABNORMAL HIGH (ref 0.44–1.00)
GFR, Estimated: 35 mL/min — ABNORMAL LOW (ref >60)
Glucose, Bld: 105 mg/dL — ABNORMAL HIGH (ref 70–99)
Potassium: 3.3 mmol/L — ABNORMAL LOW (ref 3.5–5.1)
Sodium: 143 mmol/L (ref 135–145)

## 2021-09-29 LAB — PROCALCITONIN: Procalcitonin: 1.01 ng/mL

## 2021-09-29 MED ORDER — SACCHAROMYCES BOULARDII 250 MG PO CAPS
250.0000 mg | ORAL_CAPSULE | Freq: Two times a day (BID) | ORAL | Status: DC
  Administered 2021-09-29 – 2021-09-30 (×3): 250 mg via ORAL
  Filled 2021-09-29 (×3): qty 1

## 2021-09-29 MED ORDER — POTASSIUM CHLORIDE CRYS ER 20 MEQ PO TBCR
40.0000 meq | EXTENDED_RELEASE_TABLET | Freq: Once | ORAL | Status: AC
  Administered 2021-09-29: 40 meq via ORAL
  Filled 2021-09-29: qty 2

## 2021-09-29 NOTE — Progress Notes (Signed)
Pharmacy Antibiotic Note ? ?Betty Golden is a 76 y.o. female admitted on 09/25/2021 with  aspiration pneumonia .  Pharmacy has been consulted for Unasyn dosing. ?Patient slowly improving. Extubated 3/2.  ?AKI improving ? ?Plan: ?Continue Unasyn 3000 mg IV every 12 hours. ?Monitor labs, c/s, and patient improvement. ? ?Height: 5\' 4"  (162.6 cm) ?Weight: 67.4 kg (148 lb 9.4 oz) ?IBW/kg (Calculated) : 54.7 ? ?Temp (24hrs), Avg:97.8 ?F (36.6 ?C), Min:97.5 ?F (36.4 ?C), Max:98.4 ?F (36.9 ?C) ? ?Recent Labs  ?Lab 09/25/21 ?1915 09/25/21 ?2009 09/26/21 ?2637 09/27/21 ?0411 09/28/21 ?8588 09/29/21 ?5027  ?WBC  --  22.5* 24.5* 23.7* 25.9* 23.4*  ?CREATININE 3.48*  --  3.00* 2.19* 1.73* 1.55*  ? ?  ?Estimated Creatinine Clearance: 30.1 mL/min (A) (by C-G formula based on SCr of 1.55 mg/dL (H)).   ? ?Allergies  ?Allergen Reactions  ? Other Rash and Other (See Comments)  ? Sulfa Antibiotics Rash  ? ? ?Antimicrobials this admission: ?Unasyn 3/1 >>  ? ?Microbiology results: ?3/1 BCx: ngtd ?3/1 MRSA PCR: negative ? ?Thank you for allowing pharmacy to be a part of this patient?s care. ? ?Elder Cyphers, BS Pharm D, BCPS ?Clinical Pharmacist ?Pager [phone] ?09/29/2021 11:03 AM ? ? ?

## 2021-09-30 LAB — BASIC METABOLIC PANEL
Anion gap: 7 (ref 5–15)
BUN: 26 mg/dL — ABNORMAL HIGH (ref 8–23)
CO2: 25 mmol/L (ref 22–32)
Calcium: 8.8 mg/dL — ABNORMAL LOW (ref 8.9–10.3)
Chloride: 109 mmol/L (ref 98–111)
Creatinine, Ser: 1.29 mg/dL — ABNORMAL HIGH (ref 0.44–1.00)
GFR, Estimated: 44 mL/min — ABNORMAL LOW (ref >60)
Glucose, Bld: 104 mg/dL — ABNORMAL HIGH (ref 70–99)
Potassium: 3.1 mmol/L — ABNORMAL LOW (ref 3.5–5.1)
Sodium: 141 mmol/L (ref 135–145)

## 2021-09-30 LAB — MAGNESIUM: Magnesium: 1.3 mg/dL — ABNORMAL LOW (ref 1.7–2.4)

## 2021-09-30 MED ORDER — POTASSIUM CHLORIDE CRYS ER 20 MEQ PO TBCR
40.0000 meq | EXTENDED_RELEASE_TABLET | Freq: Once | ORAL | Status: AC
Start: 2021-09-30 — End: 2021-09-30
  Administered 2021-09-30: 40 meq via ORAL
  Filled 2021-09-30: qty 2

## 2021-09-30 MED ORDER — SACCHAROMYCES BOULARDII 250 MG PO CAPS: 250.0000 mg | ORAL_CAPSULE | Freq: Two times a day (BID) | ORAL | 0 refills | Status: AC

## 2021-09-30 MED ORDER — NYSTATIN 100000 UNIT/ML MT SUSP: 5.0000 mL | Freq: Four times a day (QID) | OROMUCOSAL | 0 refills | Status: AC

## 2021-09-30 MED ORDER — TAMSULOSIN HCL 0.4 MG PO CAPS
0.4000 mg | ORAL_CAPSULE | Freq: Every day | ORAL | 0 refills | Status: DC
Start: 2021-09-30 — End: 2021-10-26

## 2021-09-30 MED ORDER — MORPHINE SULFATE (PF) 2 MG/ML IV SOLN
2.0000 mg | Freq: Once | INTRAVENOUS | Status: DC
  Filled 2021-09-30: qty 1

## 2021-09-30 MED ORDER — PANTOPRAZOLE SODIUM 40 MG PO TBEC: 40.0000 mg | DELAYED_RELEASE_TABLET | Freq: Every day | ORAL | 1 refills | Status: DC

## 2021-09-30 MED ORDER — MAGNESIUM SULFATE 4 GM/100ML IV SOLN
4.0000 g | Freq: Once | INTRAVENOUS | Status: AC
Start: 2021-09-30 — End: 2021-09-30
  Administered 2021-09-30: 4 g via INTRAVENOUS
  Filled 2021-09-30: qty 100

## 2021-09-30 MED ORDER — AMOXICILLIN-POT CLAVULANATE 875-125 MG PO TABS: 1.0000 | ORAL_TABLET | Freq: Two times a day (BID) | ORAL | 0 refills | Status: DC

## 2021-09-30 MED ORDER — HYDRALAZINE HCL 25 MG PO TABS
25.0000 mg | ORAL_TABLET | Freq: Three times a day (TID) | ORAL | Status: DC
  Administered 2021-09-30: 25 mg via ORAL
  Filled 2021-09-30: qty 1

## 2021-09-30 MED ORDER — AMLODIPINE BESYLATE 5 MG PO TABS: 5.0000 mg | ORAL_TABLET | Freq: Every day | ORAL | 1 refills | Status: DC

## 2021-09-30 NOTE — Assessment & Plan Note (Signed)
Additional oral K given prior to discharge home.   ?Follow up BMP with PCP.  ?

## 2021-09-30 NOTE — Discharge Summary (Addendum)
Physician Discharge Summary  Betty Golden [id number] DOB: 1945/11/25 DOA: 09/25/2021  PCP: Normand Sloop, MD Rheumatologist: Dr. Kathlene November  Admit date: 09/25/2021 Discharge date: 09/30/2021  Admitted From:  Home  Disposition: Home with [place]   Recommendations for Outpatient Follow-up:  Follow up with PCP in 1 weeks Follow up with rheumatologist as soon as able Referral made to urologist regarding acute urinary retention Please obtain BMP/CBC in 1-2 weeks to follow up If leukocytosis persists recommend referal to hematologist  Home Health:  PT   Discharge Condition: STABLE   CODE STATUS: FULL DIET: dysphagia 3   Brief Hospitalization Summary: Please see all hospital notes, images, labs for full details of the hospitalization. 76 y/o patient has history of fibromyalgia, hypertension, COPD, CKD, RA. She is followed by Dr. Georgette Dover at [facility] and has frequent visit for medication monitoring. Per daughter patient had her fibromyalgia and arthritis flareup about 2 weeks ago.  Since then patient has not been eating or drinking well.  She has also been more sedentary.  Yesterday patient had a fall, and she struck the back of her head.  Now they are noticing that patient is more confused and often hallucinating, seeing things that do not exist in the room. For these problems she presents to [facility] for evaluation.     ED Course: T 98.4, 142/64  96  RR 15 BMI 25. Na 133, K 3.1 BUN 72,m CR 3.93 (last 1.25). Alb 8.1, AST 135, ALT 68, WBC 20.8 diff pending, Hgb 11.7. CT c-spine w/o acute change, CT head - old lacunar R posterior corona radiata, second old lacunar injury no acute changes. CT Ab/Pel no acute changes, right posterior subsegmental atelectasis. In ED patient received IVF. TRH called to admit for pre-renal azotemia and metabolic derangement 2/2 dehydration  09/26/2021:  after pain medication given early morning, developed increasing somnolence.  RR called and naloxone given without  significant improvement.  Pt continued having periods of apnea.  ABG pH 7.21. Rapid intubation completed for airway protection, transferred to ICU. PCCM consulted.   09/27/2021:  pt extubated to nasal cannula. SLP recommending dysphagia 3 diet.   09/28/2021:  Pt having headaches from elevated BPs. She is not resting that well.  She is breathing much better.  Ambulated with PT.  WBC remains elevated.   09/29/2021: Pt reports fibromyalgia symptoms flaring up being off regular home meds.  Otherwise she is breathing better.  Wanting to go home as soon as possible.    09/30/2021: renal function continues to improve, creatinine down to 1.2.  Additional potassium replaced today.  Pt says she wants to go home. She is having difficult time resting on hospital bed and it is making her fibromyalgia worse.  She wants to follow up with her rheumatologist early this week.   HOSPITAL COURSE BY PROBLEM LIST  Assessment and Plan: * AKI (acute kidney injury) ([place]) Improving with IV fluid hydration  Foley placed for urinary retention. Creatinine down to 1.2.     Acute respiratory failure with hypoxia ([place]) Secondary to presumed aspiration Briefly intubated but now extubated and continuing to improve.   Aspiration pneumonia ([place]) Suspected that it happened early morning 3/1 Intubated for airway protection Extubated 3/2  Amp/sulbactam ordered in hospital. DC on augmentin to complete course.  Continue probiotics.  SLP evaluated on 3/2  Acute urinary retention I/O cath, persistent requirement for I/O cath >24 hours and foley placed 3/2 Plan DC foley 3/4 and has been voiding. flomax 0.4 mg trial  started Referred patient to urology [facility] for further evaluation.     Malnutrition of moderate degree Dietitian consultation for recommendations requested.   Leukocytosis Smear did not show abnormal morphology.  Procalcitonin trending down.  Recheck CBC with PCP in 1 week If persistent would recommend referral  to hematologist.   RA (rheumatoid arthritis) ([place]) Followed closely by Dr Aryal-rhematology on aggressive immunotherapy.  Plan Continue home regimen at discharge and close follow up recommended with Dr. Kathlene November.   Hypokalemia Additional oral K given prior to discharge home.   Follow up BMP with PCP.   Dehydration Improving on IV fluid as ordered  COPD (chronic obstructive pulmonary disease) (HCC) Bronchodilators as ordered Pulmonary consultation  Essential hypertension Resume home meds, added amlodipine 5 mg for additional BP control.  Follow up with PCP recommended.    Fibromyalgia Followed by Rhematology. Patient has had increased symptoms and strongly advised to follow up with her rheumatologist as soon as discharged from hospital.   Discharge Diagnoses:  Principal Problem:   AKI (acute kidney injury) ([place]) Active Problems:   Acute urinary retention   Aspiration pneumonia ([place])   Acute respiratory failure with hypoxia ([place])   Essential hypertension   COPD (chronic obstructive pulmonary disease) (HCC)   Dehydration   Hypokalemia   RA (rheumatoid arthritis) (HCC)   Leukocytosis   Malnutrition of moderate degree   Fibromyalgia   Discharge Instructions: Discharge Instructions     Ambulatory referral to Urology   Complete by: As directed       Allergies as of 09/30/2021       Reactions   Other Rash, Other (See Comments)   Sulfa Antibiotics Rash        Medication List     TAKE these medications    albuterol 108 (90 Base) MCG/ACT inhaler Commonly known as: ProAir HFA Inhale 2 puffs into the lungs every 4 (four) hours as needed for wheezing or shortness of breath.   amLODipine 5 MG tablet Commonly known as: NORVASC Take 1 tablet (5 mg total) by mouth daily. Start taking on: October 01, 2021   amoxicillin-clavulanate 875-125 MG tablet Commonly known as: Augmentin Take 1 tablet by mouth 2 (two) times daily for 4 days.   Arnuity Ellipta 200 MCG/ACT  Aepb Generic drug: Fluticasone Furoate TAKE 1 PUFF BY MOUTH EVERY DAY   aspirin 81 MG chewable tablet Chew 1 tablet (81 mg total) by mouth daily.   DULoxetine 30 MG capsule Commonly known as: CYMBALTA Take 30 mg by mouth 2 (two) times daily.   Enbrel SureClick 50 MG/ML injection Generic drug: etanercept Inject into the skin once a week.   Fasenra 30 MG/ML Sosy Generic drug: Benralizumab Inject 1 mL (30 mg total) into the skin every 8 (eight) weeks.   fluticasone 50 MCG/ACT nasal spray Commonly known as: FLONASE SPRAY 2 SPRAYS INTO EACH NOSTRIL EVERY DAY   folic acid 1 MG tablet Commonly known as: FOLVITE Take 1 mg by mouth daily.   losartan-hydrochlorothiazide 100-12.5 MG tablet Commonly known as: HYZAAR Take 1 tablet by mouth daily.   methotrexate 2.5 MG tablet Commonly known as: RHEUMATREX SMARTSIG:3 Tablet(s) By Mouth Once a Week   montelukast 10 MG tablet Commonly known as: SINGULAIR Take 10 mg by mouth at bedtime.   nystatin 100000 UNIT/ML suspension Commonly known as: MYCOSTATIN Use as directed 5 mLs (500,000 Units total) in the mouth or throat 4 (four) times daily for 3 days.   pantoprazole 40 MG tablet Commonly known as: PROTONIX Take  1 tablet (40 mg total) by mouth daily.   pregabalin 50 MG capsule Commonly known as: LYRICA Take 50 mg by mouth 2 (two) times daily.   saccharomyces boulardii 250 MG capsule Commonly known as: FLORASTOR Take 1 capsule (250 mg total) by mouth 2 (two) times daily.   simvastatin 20 MG tablet Commonly known as: ZOCOR Take 20 mg by mouth at bedtime.   tamsulosin 0.4 MG Caps capsule Commonly known as: Flomax Take 1 capsule (0.4 mg total) by mouth daily after supper.        Follow-up Information     Care, [facility] Follow up.   Specialty: [place] Why: [facility] staff will call you to schedule in home nursing and therapy visits Contact information: [address] [place] [address] [phone]         Normand Sloop, MD. Schedule an appointment as soon as possible for a visit in 1 week(s).   Specialty: Family Medicine Why: Hospital Follow Up Contact information: [address] [place] [address] [phone]         Lahoma Rocker, MD. Schedule an appointment as soon as possible for a visit in 3 day(s).   Specialty: Rheumatology Why: Hospital Follow Up Contact information: [address] [place] [address] [phone]                Allergies  Allergen Reactions   Other Rash and Other (See Comments)   Sulfa Antibiotics Rash   Allergies as of 09/30/2021       Reactions   Other Rash, Other (See Comments)   Sulfa Antibiotics Rash        Medication List     TAKE these medications    albuterol 108 (90 Base) MCG/ACT inhaler Commonly known as: ProAir HFA Inhale 2 puffs into the lungs every 4 (four) hours as needed for wheezing or shortness of breath.   amLODipine 5 MG tablet Commonly known as: NORVASC Take 1 tablet (5 mg total) by mouth daily. Start taking on: October 01, 2021   amoxicillin-clavulanate 875-125 MG tablet Commonly known as: Augmentin Take 1 tablet by mouth 2 (two) times daily for 4 days.   Arnuity Ellipta 200 MCG/ACT Aepb Generic drug: Fluticasone Furoate TAKE 1 PUFF BY MOUTH EVERY DAY   aspirin 81 MG chewable tablet Chew 1 tablet (81 mg total) by mouth daily.   DULoxetine 30 MG capsule Commonly known as: CYMBALTA Take 30 mg by mouth 2 (two) times daily.   Enbrel SureClick 50 MG/ML injection Generic drug: etanercept Inject into the skin once a week.   Fasenra 30 MG/ML Sosy Generic drug: Benralizumab Inject 1 mL (30 mg total) into the skin every 8 (eight) weeks.   fluticasone 50 MCG/ACT nasal spray Commonly known as: FLONASE SPRAY 2 SPRAYS INTO EACH NOSTRIL EVERY DAY   folic acid 1 MG tablet Commonly known as: FOLVITE Take 1 mg by mouth daily.    losartan-hydrochlorothiazide 100-12.5 MG tablet Commonly known as: HYZAAR Take 1 tablet by mouth daily.   methotrexate 2.5 MG tablet Commonly known as: RHEUMATREX SMARTSIG:3 Tablet(s) By Mouth Once a Week   montelukast 10 MG tablet Commonly known as: SINGULAIR Take 10 mg by mouth at bedtime.   nystatin 100000 UNIT/ML suspension Commonly known as: MYCOSTATIN Use as directed 5 mLs (500,000 Units total) in the mouth or throat 4 (four) times daily for 3 days.   pantoprazole 40 MG tablet Commonly known as: PROTONIX Take  1 tablet (40 mg total) by mouth daily.   pregabalin 50 MG capsule Commonly known as: LYRICA Take 50 mg by mouth 2 (two) times daily.   saccharomyces boulardii 250 MG capsule Commonly known as: FLORASTOR Take 1 capsule (250 mg total) by mouth 2 (two) times daily.   simvastatin 20 MG tablet Commonly known as: ZOCOR Take 20 mg by mouth at bedtime.   tamsulosin 0.4 MG Caps capsule Commonly known as: Flomax Take 1 capsule (0.4 mg total) by mouth daily after supper.        Procedures/Studies: CT Head Wo Contrast  Result Date: 09/25/2021 CLINICAL DATA:  Head trauma minor. Fell backwards yesterday hitting head. Headache. EXAM: CT HEAD WITHOUT CONTRAST TECHNIQUE: Contiguous axial images were obtained from the base of the skull through the vertex without intravenous contrast. RADIATION DOSE REDUCTION: This exam was performed according to the departmental dose-optimization program which includes automated exposure control, adjustment of the mA and/or kV according to patient size and/or use of iterative reconstruction technique. COMPARISON:  CT brain 04/16/2019 FINDINGS: Brain: The ventricles are normal in size and configuration. The basilar cisterns are patent. No mass, mass effect, or midline shift. Unchanged focal hypodensity likely lacunar infarct within the right posterior frontal corona radiata white matter (axial image 20). Additional chronic lacunar infarct is  again seen within the anterior limb of the left internal capsule. No acute intracranial hemorrhage is seen. No abnormal extra-axial fluid collection. Preservation of the normal cortical gray-white interface without CT evidence of an acute major vascular territorial cortical based infarction. Vascular: No hyperdense vessel or unexpected calcification. Skull: Normal. Negative for fracture or focal lesion. Sinuses/Orbits: The visualized orbits are unremarkable. The visualized paranasal sinuses and mastoid air cells are clear. Bilateral maxillary sinus antrostomy postsurgical changes are again seen. Other: None. IMPRESSION:: IMPRESSION: 1. No significant change from prior. 2. No acute intracranial process. 3. Mild chronic lacunar infarcts/small-vessel ischemic disease. Electronically Signed   By: Yvonne Kendall M.D.   On: 09/25/2021 16:26   CT Cervical Spine Wo Contrast  Result Date: 09/25/2021 CLINICAL DATA:  Fall. EXAM: CT CERVICAL SPINE WITHOUT CONTRAST TECHNIQUE: Multidetector CT imaging of the cervical spine was performed without intravenous contrast. Multiplanar CT image reconstructions were also generated. RADIATION DOSE REDUCTION: This exam was performed according to the departmental dose-optimization program which includes automated exposure control, adjustment of the mA and/or kV according to patient size and/or use of iterative reconstruction technique. COMPARISON:  None. FINDINGS: Alignment: Normal. Skull base and vertebrae: No acute fracture. No primary bone lesion or focal pathologic process. Soft tissues and spinal canal: No prevertebral fluid or swelling. No visible canal hematoma. Disc levels: Extensive anterior osteophyte formation is noted at all levels of the cervical spine. Upper chest: Negative. Other: None. IMPRESSION: Extensive degenerative changes as described above. No acute abnormality seen. Electronically Signed   By: Marijo Conception M.D.   On: 09/25/2021 16:46   DG Chest Port 1  View  Result Date: 09/27/2021 CLINICAL DATA:  Respiratory failure EXAM: PORTABLE CHEST 1 VIEW COMPARISON:  Chest x-ray dated September 27, 2021 slide FINDINGS: Slight advancement of ET tube with tip positioned approximately 1.5 cm from the carina. Enteric tube courses below the diaphragm. Mild scattered linear opacities which are likely due to scarring or atelectasis. Lungs otherwise clear. No large pleural effusion or pneumothorax. IMPRESSION: Slight advancement of ET tube with tip positioned approximately 1.5 cm from the carina. Electronically Signed   By: Yetta Glassman M.D.   On: 09/27/2021 07:59  DG CHEST PORT 1 VIEW  Result Date: 09/26/2021 CLINICAL DATA:  Weakness/increased shortness of breath. EXAM: PORTABLE CHEST 1 VIEW COMPARISON:  February 06, 2021 FINDINGS: Low lung volumes. Bilateral streaky interstitial opacities in both lungs. Normal cardiac silhouette. Tortuosity and calcific atherosclerotic disease of the aorta. IMPRESSION: Bilateral streaky interstitial opacities, which may represent atypical/viral pneumonia. Electronically Signed   By: Fidela Salisbury M.D.   On: 09/26/2021 09:46   DG Chest Port 1V same Day  Result Date: 09/26/2021 CLINICAL DATA:  Post intubation EXAM: PORTABLE CHEST 1 VIEW COMPARISON:  Portable exam at 1027 hours compared to 0932 hours FINDINGS: Tip of endotracheal tube projects 3.8 cm above carina. Nasogastric tube extends into stomach. Normal heart size, mediastinal contours, and pulmonary vascularity. Lungs clear. No pleural effusion or pneumothorax. Bones demineralized. IMPRESSION: Tube positions as above. No acute abnormalities. Electronically Signed   By: Lavonia Dana M.D.   On: 09/26/2021 10:42   CT CHEST ABDOMEN PELVIS WO CONTRAST  Result Date: 09/25/2021 CLINICAL DATA:  Back pain after fall. EXAM: CT CHEST, ABDOMEN AND PELVIS WITHOUT CONTRAST TECHNIQUE: Multidetector CT imaging of the chest, abdomen and pelvis was performed following the standard protocol without  IV contrast. RADIATION DOSE REDUCTION: This exam was performed according to the departmental dose-optimization program which includes automated exposure control, adjustment of the mA and/or kV according to patient size and/or use of iterative reconstruction technique. COMPARISON:  April 07, 2019. FINDINGS: CT CHEST FINDINGS Cardiovascular: Atherosclerosis of thoracic aorta is noted without aneurysm formation. Normal cardiac size. No pericardial effusion. Mediastinum/Nodes: No enlarged mediastinal, hilar, or axillary lymph nodes. Thyroid gland, trachea, and esophagus demonstrate no significant findings. Lungs/Pleura: No pneumothorax or pleural effusion is noted. Minimal right posterior basilar subsegmental atelectasis or scarring is noted. Probable scarring is noted laterally in the right upper lobe. No definite acute abnormality is noted in left lung. Musculoskeletal: No chest wall mass or suspicious bone lesions identified. CT ABDOMEN PELVIS FINDINGS Hepatobiliary: No focal liver abnormality is seen. Status post cholecystectomy. No biliary dilatation. Pancreas: Unremarkable. No pancreatic ductal dilatation or surrounding inflammatory changes. Spleen: Normal in size without focal abnormality. Adrenals/Urinary Tract: Adrenal glands are unremarkable. Kidneys are normal, without renal calculi, focal lesion, or hydronephrosis. Bladder is unremarkable. Stomach/Bowel: The stomach appears normal. There is no evidence of bowel obstruction or inflammation. Sigmoid diverticulosis is noted. Status post appendectomy. Vascular/Lymphatic: Aortic atherosclerosis. No enlarged abdominal or pelvic lymph nodes. Reproductive: Uterus and bilateral adnexa are unremarkable. Other: No abdominal wall hernia or abnormality. No abdominopelvic ascites. Musculoskeletal: No acute or significant osseous findings. IMPRESSION: No evidence of traumatic injury seen in the chest, abdomen or pelvis. Minimal right posterior basilar subsegmental  atelectasis or scarring is noted. Probable scarring seen in right upper lobe. Sigmoid diverticulosis without inflammation. Aortic Atherosclerosis (ICD10-I70.0). Electronically Signed   By: Marijo Conception M.D.   On: 09/25/2021 16:30     Subjective: Pt reports that she would like to go home and sleep in her own bed. She is planning to go see her rheumatologist as soon as possible.    Discharge Exam: Vitals:   09/30/21 0450 09/30/21 0813  BP: (!) 172/69   Pulse: 72   Resp: 17   Temp: 97.7 F (36.5 C)   SpO2: 99% 97%   Vitals:   09/30/21 0146 09/30/21 0245 09/30/21 0450 09/30/21 0813  BP: (!) 179/88 (!) 185/78 (!) 172/69   Pulse:   72   Resp:   17   Temp:   97.7 F (36.5  C)   TempSrc:   Oral   SpO2: 100%  99% 97%  Weight:      Height:       General exam: awake, alert, cooperative, NAD, appears weak, chronically ill, Appears calm and comfortable  Respiratory system: BBS clear to auscultation. Cardiovascular system: normal S1 & S2 heard. Tachycardic, No JVD, murmurs, rubs, gallops or clicks. No pedal edema. Gastrointestinal system: Abdomen is nondistended, soft and nontender. No organomegaly or masses felt. Normal bowel sounds heard. Central nervous system:  No focal neurological deficits. Extremities: Symmetric 5 x 5 power. Skin: No rashes, lesions or ulcers. Psychiatry: awake, alert, normal mood.   The results of significant diagnostics from this hospitalization (including imaging, microbiology, ancillary and laboratory) are listed below for reference.     Microbiology: Recent Results (from the past 240 hour(s))  Resp Panel by RT-PCR (Flu A&B, Covid) Nasopharyngeal Swab     Status: None   Collection Time: 09/25/21  2:28 PM   Specimen: Nasopharyngeal Swab; Nasopharyngeal(NP) swabs in vial transport medium  Result Value Ref Range Status   SARS Coronavirus 2 by RT PCR NEGATIVE NEGATIVE Final    Comment: (NOTE) SARS-CoV-2 target nucleic acids are NOT DETECTED.  The  SARS-CoV-2 RNA is generally detectable in upper respiratory specimens during the acute phase of infection. The lowest concentration of SARS-CoV-2 viral copies this assay can detect is 138 copies/mL. A negative result does not preclude SARS-Cov-2 infection and should not be used as the sole basis for treatment or other patient management decisions. A negative result may occur with  improper specimen collection/handling, submission of specimen other than nasopharyngeal swab, presence of viral mutation(s) within the areas targeted by this assay, and inadequate number of viral copies(<138 copies/mL). A negative result must be combined with clinical observations, patient history, and epidemiological information. The expected result is Negative.  Fact Sheet for Patients:  EntrepreneurPulse.com.au  Fact Sheet for Healthcare Providers:  IncredibleEmployment.be  This test is no t yet approved or cleared by the [place] FDA and  has been authorized for detection and/or diagnosis of SARS-CoV-2 by FDA under an Emergency Use Authorization (EUA). This EUA will remain  in effect (meaning this test can be used) for the duration of the COVID-19 declaration under Section 564(b)(1) of the Act, 21 U.S.C.section 360bbb-3(b)(1), unless the authorization is terminated  or revoked sooner.       Influenza A by PCR NEGATIVE NEGATIVE Final   Influenza B by PCR NEGATIVE NEGATIVE Final    Comment: (NOTE) The Xpert Xpress SARS-CoV-2/FLU/RSV plus assay is intended as an aid in the diagnosis of influenza from Nasopharyngeal swab specimens and should not be used as a sole basis for treatment. Nasal washings and aspirates are unacceptable for Xpert Xpress SARS-CoV-2/FLU/RSV testing.  Fact Sheet for Patients: EntrepreneurPulse.com.au  Fact Sheet for Healthcare Providers: IncredibleEmployment.be  This test is not yet approved or  cleared by the [place] FDA and has been authorized for detection and/or diagnosis of SARS-CoV-2 by FDA under an Emergency Use Authorization (EUA). This EUA will remain in effect (meaning this test can be used) for the duration of the COVID-19 declaration under Section 564(b)(1) of the Act, 21 U.S.C. section 360bbb-3(b)(1), unless the authorization is terminated or revoked.  Performed at [facility], [address], [place], [place] [address]   MRSA Next Gen by PCR, Nasal     Status: None   Collection Time: 09/26/21 10:36 AM   Specimen: Nasal Mucosa; Nasal Swab  Result Value Ref Range Status  MRSA by PCR Next Gen NOT DETECTED NOT DETECTED Final    Comment: (NOTE) The GeneXpert MRSA Assay (FDA approved for NASAL specimens only), is one component of a comprehensive MRSA colonization surveillance program. It is not intended to diagnose MRSA infection nor to guide or monitor treatment for MRSA infections. Test performance is not FDA approved in patients less than 61 years old. Performed at [facility], [address] [place], [place] [address]   Culture, blood (routine x 2)     Status: None (Preliminary result)   Collection Time: 09/26/21 11:21 AM   Specimen: BLOOD  Result Value Ref Range Status   Specimen Description BLOOD BLOOD LEFT WRIST  Final   Special Requests   Final    BOTTLES DRAWN AEROBIC AND ANAEROBIC Blood Culture results may not be optimal due to an inadequate volume of blood received in culture bottles   Culture   Final    NO GROWTH 3 DAYS Performed at [facility], [address], [place], [place] [address]    Report Status PENDING  Incomplete  Culture, blood (routine x 2)     Status: None (Preliminary result)   Collection Time: 09/26/21 12:35 PM   Specimen: BLOOD  Result Value Ref Range Status   Specimen Description BLOOD  Final   Special Requests NONE  Final   Culture   Final    NO GROWTH 3 DAYS Performed at [facility], [address] [place],  [place] [address]    Report Status PENDING  Incomplete     Labs: BNP (last 3 results) No results for input(s): BNP in the last 8760 hours. Basic Metabolic Panel: Recent Labs  Lab 09/26/21 0428 09/26/21 1235 09/26/21 1655 09/27/21 0411 09/28/21 0429 09/29/21 0610 09/30/21 0426  NA 134*  --   --  141 145 143 141  K 3.8  --   --  3.1* 3.5 3.3* 3.1*  CL 99  --   --  110 114* 109 109  CO2 20*  --   --  19* 22 24 25   GLUCOSE 85  --   --  121* 109* 105* 104*  BUN 71*  --   --  71* 60* 40* 26*  CREATININE 3.00*  --   --  2.19* 1.73* 1.55* 1.29*  CALCIUM 9.2  --   --  8.8* 9.2 9.1 8.8*  MG  --  2.2 2.3 2.3  --   --  1.3*  PHOS  --  4.5 3.2 2.6  --   --   --    Liver Function Tests: Recent Labs  Lab 09/25/21 1340 09/26/21 0428 09/27/21 0411 09/28/21 0429  AST 135* 112* 50* 25  ALT 68* 68* 47* 34  ALKPHOS 72 70 67 66  BILITOT 0.8 0.8 0.4 0.4  PROT 8.4* 7.9 6.9 6.9  ALBUMIN 3.1* 3.0* 2.5* 2.5*   No results for input(s): LIPASE, AMYLASE in the last 168 hours. No results for input(s): AMMONIA in the last 168 hours. CBC: Recent Labs  Lab 09/25/21 2009 09/26/21 0428 09/27/21 0411 09/28/21 0429 09/29/21 0610  WBC 22.5* 24.5* 23.7* 25.9* 23.4*  NEUTROABS 18.6*  --   --  20.7* 16.8*  HGB 12.0 11.4* 9.9* 10.5* 9.9*  HCT 36.0 34.7* 31.4* 32.4* 30.0*  MCV 91.1 91.8 93.5 93.1 91.5  PLT 263 287 250 313 296   Cardiac Enzymes: Recent Labs  Lab 09/25/21 1340  CKTOTAL 51   BNP: Invalid input(s): POCBNP CBG: Recent Labs  Lab 09/27/21 0121  09/27/21 0729 09/27/21 1129 09/27/21 1623 09/28/21 1124  GLUCAP 107* 127* 130* 118* 132*   D-Dimer No results for input(s): DDIMER in the last 72 hours. Hgb A1c No results for input(s): HGBA1C in the last 72 hours. Lipid Profile No results for input(s): CHOL, HDL, LDLCALC, TRIG, CHOLHDL, LDLDIRECT in the last 72 hours. Thyroid function studies No results for input(s): TSH, T4TOTAL, T3FREE, THYROIDAB in the last 72 hours.  Invalid  input(s): FREET3 Anemia work up No results for input(s): VITAMINB12, FOLATE, FERRITIN, TIBC, IRON, RETICCTPCT in the last 72 hours. Urinalysis    Component Value Date/Time   COLORURINE YELLOW 09/26/2021 1208   APPEARANCEUR HAZY (A) 09/26/2021 1208   LABSPEC 1.010 09/26/2021 1208   PHURINE 5.0 09/26/2021 1208   GLUCOSEU NEGATIVE 09/26/2021 1208   HGBUR SMALL (A) 09/26/2021 1208   BILIRUBINUR NEGATIVE 09/26/2021 1208   KETONESUR NEGATIVE 09/26/2021 1208   PROTEINUR 30 (A) 09/26/2021 1208   UROBILINOGEN 0.2 03/09/2015 1035   NITRITE NEGATIVE 09/26/2021 1208   LEUKOCYTESUR LARGE (A) 09/26/2021 1208   Sepsis Labs Invalid input(s): PROCALCITONIN,  WBC,  LACTICIDVEN Microbiology Recent Results (from the past 240 hour(s))  Resp Panel by RT-PCR (Flu A&B, Covid) Nasopharyngeal Swab     Status: None   Collection Time: 09/25/21  2:28 PM   Specimen: Nasopharyngeal Swab; Nasopharyngeal(NP) swabs in vial transport medium  Result Value Ref Range Status   SARS Coronavirus 2 by RT PCR NEGATIVE NEGATIVE Final    Comment: (NOTE) SARS-CoV-2 target nucleic acids are NOT DETECTED.  The SARS-CoV-2 RNA is generally detectable in upper respiratory specimens during the acute phase of infection. The lowest concentration of SARS-CoV-2 viral copies this assay can detect is 138 copies/mL. A negative result does not preclude SARS-Cov-2 infection and should not be used as the sole basis for treatment or other patient management decisions. A negative result may occur with  improper specimen collection/handling, submission of specimen other than nasopharyngeal swab, presence of viral mutation(s) within the areas targeted by this assay, and inadequate number of viral copies(<138 copies/mL). A negative result must be combined with clinical observations, patient history, and epidemiological information. The expected result is Negative.  Fact Sheet for Patients:   EntrepreneurPulse.com.au  Fact Sheet for Healthcare Providers:  IncredibleEmployment.be  This test is no t yet approved or cleared by the [place] FDA and  has been authorized for detection and/or diagnosis of SARS-CoV-2 by FDA under an Emergency Use Authorization (EUA). This EUA will remain  in effect (meaning this test can be used) for the duration of the COVID-19 declaration under Section 564(b)(1) of the Act, 21 U.S.C.section 360bbb-3(b)(1), unless the authorization is terminated  or revoked sooner.       Influenza A by PCR NEGATIVE NEGATIVE Final   Influenza B by PCR NEGATIVE NEGATIVE Final    Comment: (NOTE) The Xpert Xpress SARS-CoV-2/FLU/RSV plus assay is intended as an aid in the diagnosis of influenza from Nasopharyngeal swab specimens and should not be used as a sole basis for treatment. Nasal washings and aspirates are unacceptable for Xpert Xpress SARS-CoV-2/FLU/RSV testing.  Fact Sheet for Patients: EntrepreneurPulse.com.au  Fact Sheet for Healthcare Providers: IncredibleEmployment.be  This test is not yet approved or cleared by the [place] FDA and has been authorized for detection and/or diagnosis of SARS-CoV-2 by FDA under an Emergency Use Authorization (EUA). This EUA will remain in effect (meaning this test can be used) for the duration of the COVID-19 declaration under Section 564(b)(1) of the Act, 21 U.S.C.  section 360bbb-3(b)(1), unless the authorization is terminated or revoked.  Performed at [facility], [address] [place], [place] [address]   MRSA Next Gen by PCR, Nasal     Status: None   Collection Time: 09/26/21 10:36 AM   Specimen: Nasal Mucosa; Nasal Swab  Result Value Ref Range Status   MRSA by PCR Next Gen NOT DETECTED NOT DETECTED Final    Comment: (NOTE) The GeneXpert MRSA Assay (FDA approved for NASAL specimens only), is one component of a  comprehensive MRSA colonization surveillance program. It is not intended to diagnose MRSA infection nor to guide or monitor treatment for MRSA infections. Test performance is not FDA approved in patients less than 53 years old. Performed at [facility], [address] [place], [place] [address]   Culture, blood (routine x 2)     Status: None (Preliminary result)   Collection Time: 09/26/21 11:21 AM   Specimen: BLOOD  Result Value Ref Range Status   Specimen Description BLOOD BLOOD LEFT WRIST  Final   Special Requests   Final    BOTTLES DRAWN AEROBIC AND ANAEROBIC Blood Culture results may not be optimal due to an inadequate volume of blood received in culture bottles   Culture   Final    NO GROWTH 3 DAYS Performed at [facility], [address], [place], [place] [address]    Report Status PENDING  Incomplete  Culture, blood (routine x 2)     Status: None (Preliminary result)   Collection Time: 09/26/21 12:35 PM   Specimen: BLOOD  Result Value Ref Range Status   Specimen Description BLOOD  Final   Special Requests NONE  Final   Culture   Final    NO GROWTH 3 DAYS Performed at [facility], [address] [place], [place] [address]    Report Status PENDING  Incomplete   Time coordinating discharge: 38 mins  SIGNED:  Irwin Brakeman, MD  [facility] 09/30/2021, 11:17 AM How to contact the [facility] Attending or Consulting provider [place] or covering provider during after hours [place], for this patient?  Check the care team in [facility] and look for a) attending/consulting [facility] provider listed and b) the [facility] Xxx team listed Log into [email or web address] and use [place]'s universal password to access. If you do not have the password, please contact the hospital operator. Locate the [facility] provider you are looking for under [facility] and page to a number that you can be directly reached. If you still have difficulty reaching the provider, please page the [facility] (Director on Call) for the  Hospitalists listed on amion for assistance.

## 2021-09-30 NOTE — Progress Notes (Signed)
Overnight, patient's blood pressure was 179/88, RN administered PRN Blood pressure med. On re-check, it remained high; She was unable to keep her arm still throughout each of three blood pressure re-checks. She had no other symptoms, except for c/o 10/10 pain. Tylenol given. Rn notified the on-call MD, who ordered Morphine. However, the patient became comfortable and morphine was not given. Also, patient ambulated to the bathroom, but because of weakness, used the BSC instead the rest of the night.  ?

## 2021-09-30 NOTE — Discharge Instructions (Signed)
IMPORTANT INFORMATION: PAY CLOSE ATTENTION  ? ?PHYSICIAN DISCHARGE INSTRUCTIONS ? ?Follow with Primary care provider  Torres, Michael H, MD  and other consultants as instructed by your Hospitalist Physician ? ?SEEK MEDICAL CARE OR RETURN TO EMERGENCY ROOM IF SYMPTOMS COME BACK, WORSEN OR NEW PROBLEM DEVELOPS  ? ?Please note: ?You were cared for by a hospitalist during your hospital stay. Every effort will be made to forward records to your primary care provider.  You can request that your primary care provider send for your hospital records if they have not received them.  Once you are discharged, your primary care physician will handle any further medical issues. Please note that NO REFILLS for any discharge medications will be authorized once you are discharged, as it is imperative that you return to your primary care physician (or establish a relationship with a primary care physician if you do not have one) for your post hospital discharge needs so that they can reassess your need for medications and monitor your lab values. ? ?Please get a complete blood count and chemistry panel checked by your Primary MD at your next visit, and again as instructed by your Primary MD. ? ?Get Medicines reviewed and adjusted: ?Please take all your medications with you for your next visit with your Primary MD ? ?Laboratory/radiological data: ?Please request your Primary MD to go over all hospital tests and procedure/radiological results at the follow up, please ask your primary care provider to get all Hospital records sent to his/her office. ? ?In some cases, they will be blood work, cultures and biopsy results pending at the time of your discharge. Please request that your primary care provider follow up on these results. ? ?If you are diabetic, please bring your blood sugar readings with you to your follow up appointment with primary care.   ? ?Please call and make your follow up appointments as soon as possible.   ? ?Also  Note the following: ?If you experience worsening of your admission symptoms, develop shortness of breath, life threatening emergency, suicidal or homicidal thoughts you must seek medical attention immediately by calling 911 or calling your MD immediately  if symptoms less severe. ? ?You must read complete instructions/literature along with all the possible adverse reactions/side effects for all the Medicines you take and that have been prescribed to you. Take any new Medicines after you have completely understood and accpet all the possible adverse reactions/side effects.  ? ?Do not drive when taking Pain medications or sleeping medications (Benzodiazepines) ? ?Do not take more than prescribed Pain, Sleep and Anxiety Medications. It is not advisable to combine anxiety,sleep and pain medications without talking with your primary care practitioner ? ?Special Instructions: If you have smoked or chewed Tobacco  in the last 2 yrs please stop smoking, stop any regular Alcohol  and or any Recreational drug use. ? ?Wear Seat belts while driving.  Do not drive if taking any narcotic, mind altering or controlled substances or recreational drugs or alcohol.  ? ? ? ? ? ?

## 2021-09-30 NOTE — Progress Notes (Signed)
Pharmacy Antibiotic Note ? ?Betty Golden is a 76 y.o. female admitted on 09/25/2021 with  aspiration pneumonia .  Pharmacy has been consulted for Unasyn dosing. ?Patient slowly improving. Extubated 3/2.  ?AKI improving, crcl > 30mls/min ? ?Plan: ?Increase Unasyn 3000 mg IV every 8 hours. ?Monitor labs, c/s, and patient improvement. ? ?Height: 5\' 4"  (162.6 cm) ?Weight: 67.4 kg (148 lb 9.4 oz) ?IBW/kg (Calculated) : 54.7 ? ?Temp (24hrs), Avg:97.9 ?F (36.6 ?C), Min:97.7 ?F (36.5 ?C), Max:98.2 ?F (36.8 ?C) ? ?Recent Labs  ?Lab 09/25/21 ?2009 09/26/21 ?7673 09/27/21 ?0411 09/28/21 ?4193 09/29/21 ?7902 09/30/21 ?4097  ?WBC 22.5* 24.5* 23.7* 25.9* 23.4*  --   ?CREATININE  --  3.00* 2.19* 1.73* 1.55* 1.29*  ? ?  ?Estimated Creatinine Clearance: 36.1 mL/min (A) (by C-G formula based on SCr of 1.29 mg/dL (H)).   ? ?Allergies  ?Allergen Reactions  ? Other Rash and Other (See Comments)  ? Sulfa Antibiotics Rash  ? ? ?Antimicrobials this admission: ?Unasyn 3/1 >>  ? ?Microbiology results: ?3/1 BCx: ngtd ?3/1 MRSA PCR: negative ? ?Thank you for allowing pharmacy to be a part of this patient?s care. ? ?Elder Cyphers, BS Pharm D, BCPS ?Clinical Pharmacist ?Pager [phone] ?09/30/2021 12:57 PM ? ? ?

## 2021-10-01 LAB — CULTURE, BLOOD (ROUTINE X 2)
Culture: NO GROWTH
Culture: NO GROWTH

## 2021-10-03 ENCOUNTER — Encounter: Payer: Self-pay | Admitting: Allergy & Immunology

## 2021-10-03 ENCOUNTER — Ambulatory Visit (INDEPENDENT_AMBULATORY_CARE_PROVIDER_SITE_OTHER): Payer: Medicare Other

## 2021-10-03 ENCOUNTER — Other Ambulatory Visit: Payer: Self-pay

## 2021-10-03 ENCOUNTER — Ambulatory Visit: Payer: Medicare Other | Admitting: Allergy & Immunology

## 2021-10-03 VITALS — BP 120/70 | HR 106 | Temp 97.7°F | Resp 16 | Ht 64.0 in

## 2021-10-03 DIAGNOSIS — J3089 Other allergic rhinitis: Secondary | ICD-10-CM

## 2021-10-03 DIAGNOSIS — J4550 Severe persistent asthma, uncomplicated: Secondary | ICD-10-CM

## 2021-10-03 DIAGNOSIS — Z23 Encounter for immunization: Secondary | ICD-10-CM

## 2021-10-03 MED ORDER — PREDNISONE 10 MG PO TABS: ORAL_TABLET | ORAL | 0 refills | Status: DC

## 2021-10-03 MED ORDER — BENRALIZUMAB 30 MG/ML ~~LOC~~ SOSY
30.0000 mg | PREFILLED_SYRINGE | SUBCUTANEOUS | Status: DC
Start: 2021-10-03 — End: 2024-05-09
  Administered 2021-10-03 – 2024-02-09 (×15): 30 mg via SUBCUTANEOUS

## 2021-10-03 MED ORDER — MONTELUKAST SODIUM 10 MG PO TABS: 10.0000 mg | ORAL_TABLET | Freq: Every day | ORAL | 5 refills | Status: DC

## 2021-10-03 NOTE — Progress Notes (Signed)
FOLLOW UP  Date of Service/Encounter:  10/03/21   Assessment:   Severe persistent asthma without complication   Chronic rhinitis (last testing performed in [place])   Recurrent infections - with normal workup in the past (May 2020)   Rheumatoid arthritis - recently transitioned to Enbrel   Fibromyalgia   Recent history of stroke (September 2020)  Recent hospitalization for AKI - with continued SOB and malaise  Plan/Recommendations:    1. Chronic nonseasonal allergic rhinitis - Continue with fluticasone two sprays per nostril once daily. - Increase too TWICE DAILY when you feel that your sinuses are acting up.  - Continue with cetirizine (Zyrtec).     2. Moderate persistent asthma, uncomplicated - Lung function not done today. - We will refer you for oxygen treatment, but we are also going  refer you to see Pulmonology to see if you need something more long term. - I am not sure that this will be approved since your pulse ox was good today in clinic, but we can always try.  - We are going to start Trelegy instead of Arnuity for a few weeks until the next visit. - We are also starting a prednisone taper today.  - Daily controller medication(s):  Fasenra every 8 weeks  + Trelegy one puff once daily + Singulair  - Prior to physical activity: albuterol 2 puffs 10-15 minutes before physical activity. - Rescue medications: albuterol 4 puffs every 4-6 hours as needed - Changes during respiratory infections or worsening symptoms: Increase Arnuity 1 puff twice daily for TWO WEEKS. - Asthma control goals:  * Full participation in all desired activities (may need albuterol before activity) * Albuterol use two time or less a week on average (not counting use with activity) * Cough interfering with sleep two time or less a month * Oral steroids no more than once a year * No hospitalizations  3. Return in about 4 weeks (around 10/31/2021).   Subjective:   Betty Golden is a 76 y.o. female presenting today for follow up of  Chief Complaint  Patient presents with   Asthma    Was given a neb treatment and then put on oxygen - February 28th - 5th of March   Other    Sick from fibromyalgia for a while and had a fall. They discovered liver and kidney failure     Betty Golden has a history of the following: Patient Active Problem List   Diagnosis Date Noted   Malnutrition of moderate degree 09/27/2021   Acute respiratory failure with hypoxia (HCC)    Acute urinary retention 09/26/2021   Aspiration pneumonia (HCC) 09/26/2021   RA (rheumatoid arthritis) (HCC) 09/25/2021   Leukocytosis 09/25/2021   Viral pneumonia 04/20/2019   TIA (transient ischemic attack) 04/16/2019   Dyspnea 04/04/2019   COVID-19 virus infection 04/04/2019   Gastritis and gastroduodenitis 07/08/2018   Rectal bleeding 06/29/2018   Fibromyalgia 09/08/2017   Spinal stenosis of lumbar region 09/20/2015   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/03/2015   Moderate persistent asthma 07/03/2015   Allergic rhinitis 07/03/2015   Colitis due to Clostridium difficile 02/10/2015   Abdominal pain 02/10/2015   Nausea and vomiting 02/10/2015   AKI (acute kidney injury) (HCC) 02/10/2015   Dehydration 02/10/2015   Hypoxia 02/10/2015   Hyponatremia 02/10/2015   Hypokalemia 02/10/2015   COPD (chronic obstructive pulmonary disease) (HCC)    Unspecified constipation 07/15/2013   DYSPNEA 04/05/2008   Essential hypertension 03/10/2008   Headache(784.0) 03/10/2008  Cough 03/10/2008    History obtained from: chart review and patient and daughter .  Betty Golden is a 76 y.o. female presenting for a follow up visit.  She was last seen in November 2022.  At that time, we continue with Harrington Challenger every 8 weeks and Arnuity 1 puff once daily.  She did complain of sinusitis symptoms, so restarted Augmentin and prednisone.  We did talk about doing a sinus CT and repeat immune labs.  Since the last visit, she  unfortunately had a rough time. She had a "fibro flare" and went into the hospital. She was noted to have kidney and liver failure. She had some breathing issues in conjunction with this. She had oxygen at discharge and she breathing treatments BID. She was discharged on March 5th. She was intubated for two days. They are wondering whether she needs oxygen. Her daughter reports that she is having trouble getting out of breath going from one side of the house to the other. She is using her rescue inhaler several times per day. She was diagnosed with AKI, aspiration PNA, acute urinary retention, and malnutrition. It looks like she saw Dr. Craige Cotta in the hospital.   Since the last visit, she has done well.   Asthma/Respiratory Symptom History: She remains on the Arnuity and Singulair was added on.  She thinks that she was on Singulair at one point in the past, but I think that we removed this as her asthma got under better control. She remains on her Harrington Challenger and got her injection today. She is up to date on this. She has not been to the ED nor has she needed any prednisone. She does feel SOB currently. She has never seen Pulmonology to her knowledge.   Allergic Rhinitis Symptom History: She remains on her nose sprays. She is finishing a course of antibiotics for a UTI, last dose tomorrow. She has not been sneezing much at all but she has had some rhinorrhea.   Otherwise, there have been no changes to her past medical history, surgical history, family history, or social history.    Review of Systems  Constitutional: Negative.  Negative for chills, fever, malaise/fatigue and weight loss.  HENT:  Negative for congestion, ear discharge, ear pain and sinus pain.   Eyes:  Negative for pain, discharge and redness.  Respiratory:  Positive for cough and shortness of breath. Negative for sputum production and wheezing.   Cardiovascular: Negative.  Negative for chest pain and palpitations.  Gastrointestinal:   Negative for abdominal pain, constipation, diarrhea, heartburn, nausea and vomiting.  Skin: Negative.  Negative for itching and rash.  Neurological:  Negative for dizziness and headaches.  Endo/Heme/Allergies:  Positive for environmental allergies. Does not bruise/bleed easily.      Objective:   Blood pressure 120/70, pulse (!) 106, temperature 97.7 F (36.5 C), resp. rate 16, height  (1.626 m), SpO2 96 %. Body mass index is 25.51 kg/m.    Physical Exam Vitals reviewed.  Constitutional:      Appearance: She is well-developed.     Comments: Talkative. In wheelchair.   HENT:     Head: Normocephalic and atraumatic.     Right Ear: Tympanic membrane, ear canal and external ear normal.     Left Ear: Tympanic membrane, ear canal and external ear normal.     Nose: No nasal deformity, septal deviation, mucosal edema or rhinorrhea.     Right Turbinates: Enlarged and swollen.     Left Turbinates: Enlarged and swollen.  Right Sinus: No maxillary sinus tenderness or frontal sinus tenderness.     Left Sinus: No maxillary sinus tenderness or frontal sinus tenderness.     Comments: Dried rhinorrhea bilaterally.     Mouth/Throat:     Mouth: Mucous membranes are not pale and not dry.     Pharynx: Uvula midline.  Eyes:     General: Lids are normal. No allergic shiner.       Right eye: No discharge.        Left eye: No discharge.     Conjunctiva/sclera: Conjunctivae normal.     Right eye: Right conjunctiva is not injected. No chemosis.    Left eye: Left conjunctiva is not injected. No chemosis.    Pupils: Pupils are equal, round, and reactive to light.  Cardiovascular:     Rate and Rhythm: Normal rate and regular rhythm.     Heart sounds: Normal heart sounds.  Pulmonary:     Effort: Pulmonary effort is normal. No tachypnea, accessory muscle usage or respiratory distress.     Breath sounds: Normal breath sounds. No wheezing, rhonchi or rales.  Chest:     Chest wall: No tenderness.   Musculoskeletal:     Right hand: Swelling and deformity present.     Left hand: Swelling and deformity present.  Lymphadenopathy:     Cervical: No cervical adenopathy.  Skin:    General: Skin is warm.     Capillary Refill: Capillary refill takes less than 2 seconds.     Coloration: Skin is not pale.     Findings: No abrasion, erythema, petechiae or rash. Rash is not papular, urticarial or vesicular.     Comments: No eczematous or urticarial lesions noted.   Neurological:     Mental Status: She is alert.  Psychiatric:        Behavior: Behavior is cooperative.     Diagnostic studies: none        [name], MD  [facility]

## 2021-10-03 NOTE — Patient Instructions (Addendum)
1. Chronic nonseasonal allergic rhinitis ?- Continue with fluticasone two sprays per nostril once daily. ?- Increase too TWICE DAILY when you feel that your sinuses are acting up.  ?- Continue with cetirizine (Zyrtec).   ?  ?2. Moderate persistent asthma, uncomplicated ?- Lung function not done today. ?- We will refer you for oxygen treatment, but we are also going  refer you to see Pulmonology to see if you need something more long term. ?- We are going to start Trelegy instead of Arnuity for a few weeks until the next visit. ?- We are also starting a prednisone taper today.  ?- Daily controller medication(s):  Fasenra every 8 weeks  + Trelegy one puff once daily + Singulair 10mg  ?- Prior to physical activity: albuterol 2 puffs 10-15 minutes before physical activity. ?- Rescue medications: albuterol 4 puffs every 4-6 hours as needed ?- Changes during respiratory infections or worsening symptoms: Increase Arnuity 1 puff twice daily for TWO WEEKS. ?- Asthma control goals:  ?* Full participation in all desired activities (may need albuterol before activity) ?* Albuterol use two time or less a week on average (not counting use with activity) ?* Cough interfering with sleep two time or less a month ?* Oral steroids no more than once a year ?* No hospitalizations ? ?3. Return in about 4 weeks (around 10/31/2021).  ? ? ?Please inform us of any Emergency Department visits, hospitalizations, or changes in symptoms. Call us before going to the ED for breathing or allergy symptoms since we might be able to fit you in for a sick visit. Feel free to contact us anytime with any questions, problems, or concerns. ? ?It was a pleasure to see you and your family again today! ? ?Websites that have reliable patient information: ?1. American Academy of Asthma, Allergy, and Immunology: www.aaaai.org ?2. Food Allergy Research and Education (FARE): foodallergy.org ?3. Mothers of Asthmatics: http://www.asthmacommunitynetwork.org ?4.  [employer] of Allergy, Asthma, and Immunology: MissingWeapons.ca ? ? ?COVID-19 Vaccine Information can be found at: PodExchange.nl For questions related to vaccine distribution or appointments, please email [email or web address] or call [phone].  ? ?We realize that you might be concerned about having an allergic reaction to the COVID19 vaccines. To help with that concern, WE ARE OFFERING THE COVID19 VACCINES IN OUR OFFICE! Ask the front desk for dates!  ? ? ? ??Like? [place] on Facebook and Instagram for our latest updates!  ?  ? ? ?A healthy democracy works best when [employer] participate! Make sure you are registered to vote! If you have moved or changed any of your contact information, you will need to get this updated before voting! ? ?In some cases, you MAY be able to register to vote online: AromatherapyCrystals.be ? ? ? ? ? ? ? ? ? ?

## 2021-10-05 ENCOUNTER — Ambulatory Visit: Payer: Medicare Other | Admitting: Allergy & Immunology

## 2021-10-05 ENCOUNTER — Ambulatory Visit: Payer: Medicare Other

## 2021-10-09 ENCOUNTER — Ambulatory Visit: Payer: Medicare Other | Admitting: Urology

## 2021-10-09 ENCOUNTER — Encounter: Payer: Self-pay | Admitting: Urology

## 2021-10-09 ENCOUNTER — Other Ambulatory Visit: Payer: Self-pay

## 2021-10-09 VITALS — BP 116/69 | HR 101 | Ht 64.0 in | Wt 148.0 lb

## 2021-10-09 DIAGNOSIS — Z87898 Personal history of other specified conditions: Secondary | ICD-10-CM

## 2021-10-09 DIAGNOSIS — R8290 Unspecified abnormal findings in urine: Secondary | ICD-10-CM

## 2021-10-09 NOTE — Progress Notes (Signed)
? ?Assessment: ?1. History of urinary retention   ?2. Abnormal urine findings   ? ?Plan: ?I reviewed her records from her recent hospitalization including provider notes and lab results. ?Urine culture sent today ?Will call with results ?D/C tamsulosin ?Return to office in 1 month with bladder scan ? ?Chief Complaint:  ?Chief Complaint  ?Patient presents with  ? Urinary Retention  ? ? ?History of Present Illness: ? ?Betty Golden is a 76 y.o. year old female who is seen in consultation from Standley Dakins, MD for evaluation of acute urinary retention.  She presented to the hospital on 09/25/2021 with confusion after a fall.  Evaluation demonstrated a creatinine of 3.93.  Urinalysis showed 11-20 WBCs, 0-5 RBCs, and few bacteria.  CT from 09/25/2021 did not show any evidence of hydronephrosis or obstruction.  She required in and out catheterization during her hospitalization and a Foley was placed on 09/27/2021.  Her Foley was ultimately removed on 09/29/2021 and the patient was able to void spontaneously.  She was started on tamsulosin 0.4 mg daily.  Her creatinine improved to 1.2 prior to discharge. ?She has been voiding spontaneously since her discharge from the hospital.  No dysuria or gross hematuria.  No recent UTIs.  She reports problems with dehydration associated with flareups of her fibromyalgia. ? ? ?Past Medical History:  ?Past Medical History:  ?Diagnosis Date  ? Arthritis   ? Asthma   ? C. difficile colitis   ? Cervical stenosis of spine   ? Chronic headaches   ? Chronic neck pain   ? Coronary artery disease   ? Depression   ? Diverticulosis   ? Essential hypertension   ? Fibromyalgia   ? GERD (gastroesophageal reflux disease)   ? History of Salmonella gastroenteritis   ? HNP (herniated nucleus pulposus), lumbar   ? Hyperlipidemia   ? IBS (irritable bowel syndrome)   ? Lung nodule   ? ? ?Past Surgical History:  ?Past Surgical History:  ?Procedure Laterality Date  ? APPENDECTOMY  1965  ? BIOPSY   08/26/2018  ? Procedure: BIOPSY;  Surgeon: Malissa Hippo, MD;  Location: [facility];  Service: Endoscopy;;  gastric  ? BREAST BIOPSY    ? BREAST REDUCTION SURGERY  1994  ? CHOLECYSTECTOMY  1975  ? COLONOSCOPY N/A 08/19/2013  ? Procedure: COLONOSCOPY;  Surgeon: Malissa Hippo, MD;  Location: [facility];  Service: Endoscopy;  Laterality: N/A;  155-moved to 140 Ann to notify pt  ? COLONOSCOPY N/A 08/26/2018  ? Procedure: COLONOSCOPY;  Surgeon: Malissa Hippo, MD;  Location: [facility];  Service: Endoscopy;  Laterality: N/A;  ? ESOPHAGOGASTRODUODENOSCOPY N/A 08/26/2018  ? Procedure: ESOPHAGOGASTRODUODENOSCOPY (EGD);  Surgeon: Malissa Hippo, MD;  Location: [facility];  Service: Endoscopy;  Laterality: N/A;  ? fibromyalgia    ? LUMBAR LAMINECTOMY/DECOMPRESSION MICRODISCECTOMY Bilateral 09/20/2015  ? Procedure: MICRO LUMBAR DECOMPRESSION L5-S1 BILATERAL    (1 LEVEL);  Surgeon: [name], MD;  Location: [facility];  Service: Orthopedics;  Laterality: Bilateral;  ? rheumatoid arthritis    ? Sinus Surgery    ? TONSILLECTOMY  1968  ? ? ?Allergies:  ?Allergies  ?Allergen Reactions  ? Hydromorphone Anaphylaxis  ? Other Rash and Other (See Comments)  ? Sulfa Antibiotics Rash  ? ? ?Family History:  ?Family History  ?Problem Relation Age of Onset  ? Heart disease Mother   ? Hypertension Mother   ? Asthma Mother   ? Heart disease Father   ?  Stroke Father   ? Hypertension Father   ? Stroke Sister   ? Asthma Sister   ? Hypertension Brother   ? Asthma Brother   ? Asthma Maternal Grandmother   ? Heart disease Maternal Grandmother   ? Diabetes Maternal Grandfather   ? Asthma Maternal Aunt   ? Colon cancer Neg Hx   ? ? ?Social History:  ?Social History  ? ?Tobacco Use  ? Smoking status: Former  ?  Packs/day: 2.00  ?  Years: 10.00  ?  Pack years: 20.00  ?  Types: Cigarettes  ?  Quit date: 09/11/1988  ?  Years since quitting: 33.0  ? Smokeless tobacco: Never  ? Tobacco comments:  ?  quit 1990  ?Vaping Use  ? Vaping Use: Never  used  ?Substance Use Topics  ? Alcohol use: No  ?  Comment: stopped in 1990  ? Drug use: No  ?  Comment: hx smoking marijuana and cocaine weekends. Stopped in the 1990s.  ? ? ?Review of symptoms:  ?Constitutional:  Negative for unexplained weight loss, night sweats, fever, chills ?ENT:  Negative for nose bleeds, sinus pain, painful swallowing ?CV:  Negative for chest pain, shortness of breath, exercise intolerance, palpitations, loss of consciousness ?Resp:  Negative for cough, wheezing, shortness of breath ?GI:  Negative for nausea, vomiting, diarrhea, bloody stools ?GU:  Positives noted in HPI; otherwise negative for gross hematuria, dysuria, urinary incontinence ?Neuro:  Negative for seizures, poor balance, limb weakness, slurred speech ?Psych:  Negative for lack of energy, depression, anxiety ?Endocrine:  Negative for polydipsia, polyuria, symptoms of hypoglycemia (dizziness, hunger, sweating) ?Hematologic:  Negative for anemia, purpura, petechia, prolonged or excessive bleeding, use of anticoagulants  ?Allergic:  Negative for difficulty breathing or choking as a result of exposure to anything; no shellfish allergy; no allergic response (rash/itch) to materials, foods ? ?Physical exam: ?BP 116/69   Pulse (!) 101   Ht  (1.626 m)   Wt 148 lb (67.1 kg)   BMI 25.40 kg/m?  ?GENERAL APPEARANCE:  Well appearing, well developed, well nourished, NAD ?HEENT: Atraumatic, Normocephalic, oropharynx clear. ?NECK: Supple without lymphadenopathy or thyromegaly. ?LUNGS: Clear to auscultation bilaterally. ?HEART: Regular Rate and Rhythm without murmurs, gallops, or rubs. ?ABDOMEN: Soft, non-tender, No Masses. ?EXTREMITIES: Moves all extremities well.  Without clubbing, cyanosis, or edema. ?NEUROLOGIC:  Alert and oriented x 3, in wheelchair, CN II-XII grossly intact.  ?MENTAL STATUS:  Appropriate. ?BACK:  Non-tender to palpation.  No CVAT ?SKIN:  Warm, dry and intact.   ? ?Results: ?U/A:  6-10 WBC, 11-30 RBC, mod  bacteria ? ? ?PVR:  0 ml ?

## 2021-10-09 NOTE — Progress Notes (Signed)
post void residual=0 ?

## 2021-10-10 LAB — URINALYSIS, ROUTINE W REFLEX MICROSCOPIC
Bilirubin, UA: NEGATIVE
Glucose, UA: NEGATIVE
Ketones, UA: NEGATIVE
Nitrite, UA: NEGATIVE
Protein,UA: NEGATIVE
Specific Gravity, UA: 1.01 (ref 1.005–1.030)
Urobilinogen, Ur: 0.2 mg/dL (ref 0.2–1.0)
pH, UA: 7 (ref 5.0–7.5)

## 2021-10-10 LAB — MICROSCOPIC EXAMINATION: Renal Epithel, UA: NONE SEEN /hpf

## 2021-10-12 ENCOUNTER — Telehealth: Payer: Self-pay

## 2021-10-12 LAB — URINE CULTURE

## 2021-10-12 MED ORDER — CEFDINIR 300 MG PO CAPS: 300.0000 mg | ORAL_CAPSULE | Freq: Two times a day (BID) | ORAL | 0 refills | Status: AC

## 2021-10-12 NOTE — Telephone Encounter (Signed)
Called both numbers listed. No answer. ? ?Left detailed message of medication at pharmacy for positive urine culture ?

## 2021-10-12 NOTE — Addendum Note (Signed)
Addended by: Primus Bravo on: 10/12/2021 08:47 AM ? ? Modules accepted: Orders ? ?

## 2021-10-12 NOTE — Telephone Encounter (Signed)
-----   Message from Milderd Meager, MD sent at 10/12/2021  8:47 AM EDT ----- ?Please notify patient that her urine culture shows evidence of a UTI.  I have sent in a prescription for Omnicef x 7 days for treatment. ?

## 2021-10-15 ENCOUNTER — Telehealth: Payer: Self-pay | Admitting: Physician Assistant

## 2021-10-15 NOTE — Telephone Encounter (Signed)
Scheduled appt per 3/20 referral. Pt's daughter is aware of appt date and time. Pt's daughter is aware to arrive 15 mins prior to appt time and to bring and updated insurance card. Pt's daughter is aware of appt location.   ?

## 2021-10-15 NOTE — Telephone Encounter (Signed)
Scheduled appt per 3/20 referral. Pt is aware of appt date and time. Pt is aware to arrive 15 mins prior to appt time and to bring and updated insurance card. Pt is aware of appt location.   ?

## 2021-10-16 ENCOUNTER — Encounter: Payer: Self-pay | Admitting: Physician Assistant

## 2021-10-16 ENCOUNTER — Other Ambulatory Visit: Payer: Self-pay

## 2021-10-16 ENCOUNTER — Inpatient Hospital Stay: Payer: Medicare Other | Attending: Physician Assistant | Admitting: Physician Assistant

## 2021-10-16 ENCOUNTER — Inpatient Hospital Stay: Payer: Medicare Other

## 2021-10-16 VITALS — BP 133/57 | HR 114 | Temp 97.9°F | Resp 18 | Wt 148.9 lb

## 2021-10-16 DIAGNOSIS — D72829 Elevated white blood cell count, unspecified: Secondary | ICD-10-CM | POA: Diagnosis present

## 2021-10-16 DIAGNOSIS — K589 Irritable bowel syndrome without diarrhea: Secondary | ICD-10-CM | POA: Insufficient documentation

## 2021-10-16 DIAGNOSIS — E785 Hyperlipidemia, unspecified: Secondary | ICD-10-CM | POA: Diagnosis not present

## 2021-10-16 DIAGNOSIS — J449 Chronic obstructive pulmonary disease, unspecified: Secondary | ICD-10-CM | POA: Diagnosis not present

## 2021-10-16 DIAGNOSIS — Z79899 Other long term (current) drug therapy: Secondary | ICD-10-CM | POA: Diagnosis not present

## 2021-10-16 DIAGNOSIS — M069 Rheumatoid arthritis, unspecified: Secondary | ICD-10-CM | POA: Diagnosis not present

## 2021-10-16 DIAGNOSIS — I2510 Atherosclerotic heart disease of native coronary artery without angina pectoris: Secondary | ICD-10-CM | POA: Insufficient documentation

## 2021-10-16 DIAGNOSIS — D72825 Bandemia: Secondary | ICD-10-CM

## 2021-10-16 DIAGNOSIS — F32A Depression, unspecified: Secondary | ICD-10-CM | POA: Diagnosis not present

## 2021-10-16 DIAGNOSIS — K5900 Constipation, unspecified: Secondary | ICD-10-CM | POA: Insufficient documentation

## 2021-10-16 DIAGNOSIS — T148XXA Other injury of unspecified body region, initial encounter: Secondary | ICD-10-CM | POA: Insufficient documentation

## 2021-10-16 DIAGNOSIS — Z87891 Personal history of nicotine dependence: Secondary | ICD-10-CM | POA: Insufficient documentation

## 2021-10-16 DIAGNOSIS — K219 Gastro-esophageal reflux disease without esophagitis: Secondary | ICD-10-CM | POA: Diagnosis not present

## 2021-10-16 DIAGNOSIS — Z7982 Long term (current) use of aspirin: Secondary | ICD-10-CM | POA: Diagnosis not present

## 2021-10-16 DIAGNOSIS — M797 Fibromyalgia: Secondary | ICD-10-CM | POA: Insufficient documentation

## 2021-10-16 DIAGNOSIS — D649 Anemia, unspecified: Secondary | ICD-10-CM | POA: Diagnosis present

## 2021-10-16 DIAGNOSIS — I10 Essential (primary) hypertension: Secondary | ICD-10-CM | POA: Diagnosis not present

## 2021-10-16 LAB — CBC WITH DIFFERENTIAL (CANCER CENTER ONLY)
Abs Immature Granulocytes: 0.13 10*3/uL — ABNORMAL HIGH (ref 0.00–0.07)
Basophils Absolute: 0 10*3/uL (ref 0.0–0.1)
Basophils Relative: 0 %
Eosinophils Absolute: 0 10*3/uL (ref 0.0–0.5)
Eosinophils Relative: 0 %
HCT: 31.5 % — ABNORMAL LOW (ref 36.0–46.0)
Hemoglobin: 9.9 g/dL — ABNORMAL LOW (ref 12.0–15.0)
Immature Granulocytes: 1 %
Lymphocytes Relative: 43 %
Lymphs Abs: 6.4 10*3/uL — ABNORMAL HIGH (ref 0.7–4.0)
MCH: 29.6 pg (ref 26.0–34.0)
MCHC: 31.4 g/dL (ref 30.0–36.0)
MCV: 94 fL (ref 80.0–100.0)
Monocytes Absolute: 1 10*3/uL (ref 0.1–1.0)
Monocytes Relative: 7 %
Neutro Abs: 7.3 10*3/uL (ref 1.7–7.7)
Neutrophils Relative %: 49 %
Platelet Count: 347 10*3/uL (ref 150–400)
RBC: 3.35 MIL/uL — ABNORMAL LOW (ref 3.87–5.11)
RDW: 15.1 % (ref 11.5–15.5)
WBC Count: 14.9 10*3/uL — ABNORMAL HIGH (ref 4.0–10.5)
nRBC: 0 % (ref 0.0–0.2)

## 2021-10-16 LAB — CMP (CANCER CENTER ONLY)
ALT: 15 U/L (ref 0–44)
AST: 17 U/L (ref 15–41)
Albumin: 3.6 g/dL (ref 3.5–5.0)
Alkaline Phosphatase: 61 U/L (ref 38–126)
Anion gap: 6 (ref 5–15)
BUN: 22 mg/dL (ref 8–23)
CO2: 34 mmol/L — ABNORMAL HIGH (ref 22–32)
Calcium: 9.6 mg/dL (ref 8.9–10.3)
Chloride: 101 mmol/L (ref 98–111)
Creatinine: 1.43 mg/dL — ABNORMAL HIGH (ref 0.44–1.00)
GFR, Estimated: 38 mL/min — ABNORMAL LOW (ref >60)
Glucose, Bld: 87 mg/dL (ref 70–99)
Potassium: 4.4 mmol/L (ref 3.5–5.1)
Sodium: 141 mmol/L (ref 135–145)
Total Bilirubin: 0.4 mg/dL (ref 0.3–1.2)
Total Protein: 7 g/dL (ref 6.5–8.1)

## 2021-10-16 LAB — RETIC PANEL
Immature Retic Fract: 7.9 % (ref 2.3–15.9)
RBC.: 3.3 MIL/uL — ABNORMAL LOW (ref 3.87–5.11)
Retic Count, Absolute: 83.8 10*3/uL (ref 19.0–186.0)
Retic Ct Pct: 2.5 % (ref 0.4–3.1)
Reticulocyte Hemoglobin: 31.8 pg (ref >27.9)

## 2021-10-16 LAB — IRON AND IRON BINDING CAPACITY (CC-WL,HP ONLY)
Iron: 56 ug/dL (ref 28–170)
Saturation Ratios: 16 % (ref 10.4–31.8)
TIBC: 357 ug/dL (ref 250–450)
UIBC: 301 ug/dL (ref 148–442)

## 2021-10-16 LAB — C-REACTIVE PROTEIN: CRP: 1.3 mg/dL — ABNORMAL HIGH (ref <1.0)

## 2021-10-16 LAB — VITAMIN B12: Vitamin B-12: 464 pg/mL (ref 180–914)

## 2021-10-16 LAB — FOLATE: Folate: 65 ng/mL (ref >5.9)

## 2021-10-16 LAB — SEDIMENTATION RATE: Sed Rate: 50 mm/hr — ABNORMAL HIGH (ref 0–22)

## 2021-10-16 LAB — LACTATE DEHYDROGENASE: LDH: 178 U/L (ref 98–192)

## 2021-10-16 NOTE — Progress Notes (Signed)
[facility] [phone]   [phone]  INITIAL CONSULT NOTE  Betty Golden Care Team: Exie Parody, MD as PCP - General (Family Medicine) Dellis Anes Hetty Ely, MD as Consulting Physician (Allergy and Immunology)  CHIEF COMPLAINTS/PURPOSE OF CONSULTATION:  Leukocytosis and anemia  HISTORY OF PRESENTING ILLNESS:  Betty Golden 75 y.o. female with medical history significant for rheumatoid arthritis, fibromyalgia, CAD, COPD, HTN, GERD and IBS. She is accompanied by her daughter for this visit.   On review of the previous records, Betty Golden was admitted from 09/25/2021-09/30/2021 due to flare up of fibromyalgia and RA lasting two weeks. During the admission, her WBC was high as 25.9, ANC 20.7. Betty Golden had a follow up with her rheumatologist,Dr. Casimer Lanius, one week after hospital discharge on 10/10/2021. Repeat labs showed persistent leukocytosis and anemia with WBC 26.2, Hgh 10.5, MCV 93.9, Plt 515. Dr. Deanne Coffer recommended to temporarily hold methotrexate and simponi to further evaluate underlying cause of leukocytosis and anemia.   On exam today, Betty Golden reports ongoing weakness due to recent flare up from her RA and fibromyalgia. She requires a walker or wheelchair to help with ambulation. With her last flare up, she lost her appetite. She lost approximately 20 lbs since November 2022. She denies nausea or vomiting. She has diffuse body and joint pain that is intermittent and lasting 4-5 days at a time. She took oral prednisone from 10/03/2021-10/09/2021 which improved some of her pain. She reports constipation with her last bowel movement approximately one week ago. She has hemorrhoids that bleeds when she strains during a BM. She has shortness of breath with exertion but improved with using her inhaler. She denies fevers, chills, night sweats, chest pain, cough, neuropathy or edema. She has no other complaints. Rest of the 10 point ROS is below.   MEDICAL  HISTORY:  Past Medical History:  Diagnosis Date   Asthma    C. difficile colitis    Cervical stenosis of spine    Chronic headaches    Chronic neck pain    Coronary artery disease    Depression    Diverticulosis    Essential hypertension    Fibromyalgia    GERD (gastroesophageal reflux disease)    History of Salmonella gastroenteritis    HNP (herniated nucleus pulposus), lumbar    Hyperlipidemia    IBS (irritable bowel syndrome)    Lung nodule    rheumatoid arthritis     SURGICAL HISTORY: Past Surgical History:  Procedure Laterality Date   APPENDECTOMY  1965   BIOPSY  08/26/2018   Procedure: BIOPSY;  Surgeon: Malissa Hippo, MD;  Location: [facility];  Service: Endoscopy;;  gastric   BREAST BIOPSY     BREAST REDUCTION SURGERY  1994   CHOLECYSTECTOMY  1975   COLONOSCOPY N/A 08/19/2013   Procedure: COLONOSCOPY;  Surgeon: Malissa Hippo, MD;  Location: [facility];  Service: Endoscopy;  Laterality: N/A;  155-moved to 140 Ann to notify pt   COLONOSCOPY N/A 08/26/2018   Procedure: COLONOSCOPY;  Surgeon: Malissa Hippo, MD;  Location: [facility];  Service: Endoscopy;  Laterality: N/A;   ESOPHAGOGASTRODUODENOSCOPY N/A 08/26/2018   Procedure: ESOPHAGOGASTRODUODENOSCOPY (EGD);  Surgeon: Malissa Hippo, MD;  Location: [facility];  Service: Endoscopy;  Laterality: N/A;   fibromyalgia     LUMBAR LAMINECTOMY/DECOMPRESSION MICRODISCECTOMY Bilateral 09/20/2015   Procedure: MICRO LUMBAR DECOMPRESSION L5-S1 BILATERAL    (1 LEVEL);  Surgeon: [name], MD;  Location: [facility];  Service:  Orthopedics;  Laterality: Bilateral;   rheumatoid arthritis     Sinus Surgery     TONSILLECTOMY  1968    SOCIAL HISTORY: Social History   Socioeconomic History   Marital status: Single    Spouse name: Not on file   Number of children: 1   Years of education: Not on file   Highest education level: GED or equivalent  Occupational History   Not on file  Tobacco Use   Smoking status:  Former    Packs/day: 2.00    Years: 10.00    Pack years: 20.00    Types: Cigarettes    Quit date: 09/11/1988    Years since quitting: 33.1   Smokeless tobacco: Never   Tobacco comments:    quit 1990  Vaping Use   Vaping Use: Never used  Substance and Sexual Activity   Alcohol use: No    Comment: stopped in 1990   Drug use: No    Comment: hx smoking marijuana and cocaine weekends. Stopped in the 1990s.   Sexual activity: Not on file  Other Topics Concern   Not on file  Social History Narrative   Lives at home with her daughter & daughter's family (they live with her)   Right handed   Drinks no caffeine   Social Determinants of Health   Financial Resource Strain: Not on file  Food Insecurity: Not on file  Transportation Needs: Not on file  Physical Activity: Not on file  Stress: Not on file  Social Connections: Not on file  Intimate Partner Violence: Not on file    FAMILY HISTORY: Family History  Problem Relation Age of Onset   Heart disease Mother    Hypertension Mother    Asthma Mother    Heart disease Father    Stroke Father    Hypertension Father    Stroke Sister    Asthma Sister    Hypertension Brother    Asthma Brother    Asthma Maternal Grandmother    Heart disease Maternal Grandmother    Diabetes Maternal Grandfather    Asthma Maternal Aunt    Colon cancer Neg Hx     ALLERGIES:  is allergic to hydromorphone and sulfa antibiotics.  MEDICATIONS:  Current Outpatient Medications  Medication Sig Dispense Refill   albuterol (PROAIR HFA) 108 (90 Base) MCG/ACT inhaler Inhale 2 puffs into the lungs every 4 (four) hours as needed for wheezing or shortness of breath. 18 g 3   amLODipine (NORVASC) 5 MG tablet Take 1 tablet (5 mg total) by mouth daily. 30 tablet 1   aspirin 81 MG chewable tablet Chew 1 tablet (81 mg total) by mouth daily. 30 tablet 0   Benralizumab (FASENRA) 30 MG/ML SOSY Inject 1 mL (30 mg total) into the skin every 8 (eight) weeks. 1 mL 6    cefdinir (OMNICEF) 300 MG capsule Take 1 capsule (300 mg total) by mouth 2 (two) times daily for 7 days. 14 capsule 0   DULoxetine (CYMBALTA) 30 MG capsule Take 30 mg by mouth 2 (two) times daily.     fluticasone (FLONASE) 50 MCG/ACT nasal spray SPRAY 2 SPRAYS INTO EACH NOSTRIL EVERY DAY 48 mL 1   Fluticasone Furoate (ARNUITY ELLIPTA) 200 MCG/ACT AEPB TAKE 1 PUFF BY MOUTH EVERY DAY 30 each 3   folic acid (FOLVITE) 1 MG tablet Take 1 mg by mouth daily.     losartan-hydrochlorothiazide (HYZAAR) 100-12.5 MG tablet Take 1 tablet by mouth daily.     montelukast (SINGULAIR)  10 MG tablet Take 1 tablet (10 mg total) by mouth at bedtime. 30 tablet 5   pantoprazole (PROTONIX) 40 MG tablet Take 1 tablet (40 mg total) by mouth daily. 30 tablet 1   pregabalin (LYRICA) 50 MG capsule Take 50 mg by mouth 2 (two) times daily.     saccharomyces boulardii (FLORASTOR) 250 MG capsule Take 1 capsule (250 mg total) by mouth 2 (two) times daily. 60 capsule 0   simvastatin (ZOCOR) 20 MG tablet Take 20 mg by mouth at bedtime.      ENBREL SURECLICK 50 MG/ML injection Inject into the skin once a week. (Betty Golden not taking: Reported on 10/16/2021)     methotrexate (RHEUMATREX) 2.5 MG tablet SMARTSIG:3 Tablet(s) By Mouth Once a Week (Betty Golden not taking: Reported on 10/16/2021)     predniSONE (DELTASONE) 10 MG tablet Take two tablets (20mg ) twice daily for three days, then one tablet (10mg ) twice daily for three days, then STOP. (Betty Golden not taking: Reported on 10/16/2021) 18 tablet 0   tamsulosin (FLOMAX) 0.4 MG CAPS capsule Take 1 capsule (0.4 mg total) by mouth daily after supper. (Betty Golden not taking: Reported on 10/16/2021) 30 capsule 0   Current Facility-Administered Medications  Medication Dose Route Frequency Provider Last Rate Last Admin   Benralizumab SOSY 30 mg  30 mg Subcutaneous Q8 Thomes Dinning, MD   30 mg at 10/03/21 1341    REVIEW OF SYSTEMS:   Constitutional: ( - ) fevers, ( - )  chills , ( - ) night  sweats Eyes: ( - ) blurriness of vision, ( - ) double vision, ( - ) watery eyes Ears, nose, mouth, throat, and face: ( - ) mucositis, ( - ) sore throat Respiratory: ( - ) cough, ( - ) dyspnea, ( - ) wheezes Cardiovascular: ( - ) palpitation, ( - ) chest discomfort, ( - ) lower extremity swelling Gastrointestinal:  ( - ) nausea, ( - ) heartburn, ( +) change in bowel habits Skin: ( - ) abnormal skin rashes Lymphatics: ( - ) new lymphadenopathy, ( - ) easy bruising Neurological: ( - ) numbness, ( - ) tingling, ( - ) new weaknesses Behavioral/Psych: ( - ) mood change, ( - ) new changes  All other systems were reviewed with the Betty Golden and are negative.  PHYSICAL EXAMINATION: ECOG PERFORMANCE STATUS: 2 - Symptomatic, <50% confined to bed  Vitals:   10/16/21 1348  BP: (!) 133/57  Pulse: (!) 114  Resp: 18  Temp: 97.9 F (36.6 C)  SpO2: 100%   Filed Weights   10/16/21 1348  Weight: 148 lb 14.4 oz (67.5 kg)    GENERAL: well appearing female in NAD  SKIN: skin color, texture, turgor are normal, no rashes or significant lesions. Multiple areas of bruising with some induration in the abdomen. EYES: conjunctiva are pink and non-injected, sclera clear OROPHARYNX: no exudate, no erythema; lips, buccal mucosa, and tongue normal  NECK: supple, non-tender LYMPH:  no palpable lymphadenopathy in the cervical or supraclavicular lymph nodes.  LUNGS: clear to auscultation and percussion with normal breathing effort HEART: regular rate & rhythm and no murmurs and no lower extremity edema ABDOMEN: soft, non-tender, non-distended, normal bowel sounds Musculoskeletal: no cyanosis of digits and no clubbing  PSYCH: alert & oriented x 3, fluent speech NEURO: no focal motor/sensory deficits  LABORATORY DATA:  I have reviewed the data as listed    Latest Ref Rng & Units 10/16/2021    3:10 PM 09/29/2021  6:10 AM 09/28/2021    4:29 AM  CBC  WBC 4.0 - 10.5 K/uL 14.9   23.4   25.9    Hemoglobin 12.0 -  15.0 g/dL 9.9   9.9   16.1    Hematocrit 36.0 - 46.0 % 31.5   30.0   32.4    Platelets 150 - 400 K/uL 347   296   313         Latest Ref Rng & Units 10/16/2021    3:10 PM 09/30/2021    4:26 AM 09/29/2021    6:10 AM  CMP  Glucose 70 - 99 mg/dL 87   096   045    BUN 8 - 23 mg/dL 22   26   40    Creatinine 0.44 - 1.00 mg/dL 4.09   8.11   9.14    Sodium 135 - 145 mmol/L 141   141   143    Potassium 3.5 - 5.1 mmol/L 4.4   3.1   3.3    Chloride 98 - 111 mmol/L 101   109   109    CO2 22 - 32 mmol/L 34   25   24    Calcium 8.9 - 10.3 mg/dL 9.6   8.8   9.1    Total Protein 6.5 - 8.1 g/dL 7.0      Total Bilirubin 0.3 - 1.2 mg/dL 0.4      Alkaline Phos 38 - 126 U/L 61      AST 15 - 41 U/L 17      ALT 0 - 44 U/L 15         RADIOGRAPHIC STUDIES: I have personally reviewed the radiological images as listed and agreed with the findings in the report. CT Head Wo Contrast  Result Date: 09/25/2021 CLINICAL DATA:  Head trauma minor. Fell backwards yesterday hitting head. Headache. EXAM: CT HEAD WITHOUT CONTRAST TECHNIQUE: Contiguous axial images were obtained from the base of the skull through the vertex without intravenous contrast. RADIATION DOSE REDUCTION: This exam was performed according to the departmental dose-optimization program which includes automated exposure control, adjustment of the mA and/or kV according to Betty Golden size and/or use of iterative reconstruction technique. COMPARISON:  CT brain 04/16/2019 FINDINGS: Brain: The ventricles are normal in size and configuration. The basilar cisterns are patent. No mass, mass effect, or midline shift. Unchanged focal hypodensity likely lacunar infarct within the right posterior frontal corona radiata white matter (axial image 20). Additional chronic lacunar infarct is again seen within the anterior limb of the left internal capsule. No acute intracranial hemorrhage is seen. No abnormal extra-axial fluid collection. Preservation of the normal cortical  gray-white interface without CT evidence of an acute major vascular territorial cortical based infarction. Vascular: No hyperdense vessel or unexpected calcification. Skull: Normal. Negative for fracture or focal lesion. Sinuses/Orbits: The visualized orbits are unremarkable. The visualized paranasal sinuses and mastoid air cells are clear. Bilateral maxillary sinus antrostomy postsurgical changes are again seen. Other: None. IMPRESSION:: IMPRESSION: 1. No significant change from prior. 2. No acute intracranial process. 3. Mild chronic lacunar infarcts/small-vessel ischemic disease. Electronically Signed   By: Neita Garnet M.D.   On: 09/25/2021 16:26   CT Cervical Spine Wo Contrast  Result Date: 09/25/2021 CLINICAL DATA:  Fall. EXAM: CT CERVICAL SPINE WITHOUT CONTRAST TECHNIQUE: Multidetector CT imaging of the cervical spine was performed without intravenous contrast. Multiplanar CT image reconstructions were also generated. RADIATION DOSE REDUCTION: This exam was performed according to the departmental  dose-optimization program which includes automated exposure control, adjustment of the mA and/or kV according to Betty Golden size and/or use of iterative reconstruction technique. COMPARISON:  None. FINDINGS: Alignment: Normal. Skull base and vertebrae: No acute fracture. No primary bone lesion or focal pathologic process. Soft tissues and spinal canal: No prevertebral fluid or swelling. No visible canal hematoma. Disc levels: Extensive anterior osteophyte formation is noted at all levels of the cervical spine. Upper chest: Negative. Other: None. IMPRESSION: Extensive degenerative changes as described above. No acute abnormality seen. Electronically Signed   By: Lupita Raider M.D.   On: 09/25/2021 16:46   DG Chest Port 1 View  Result Date: 09/27/2021 CLINICAL DATA:  Respiratory failure EXAM: PORTABLE CHEST 1 VIEW COMPARISON:  Chest x-ray dated September 27, 2021 slide FINDINGS: Slight advancement of ET tube with tip  positioned approximately 1.5 cm from the carina. Enteric tube courses below the diaphragm. Mild scattered linear opacities which are likely due to scarring or atelectasis. Lungs otherwise clear. No large pleural effusion or pneumothorax. IMPRESSION: Slight advancement of ET tube with tip positioned approximately 1.5 cm from the carina. Electronically Signed   By: Allegra Lai M.D.   On: 09/27/2021 07:59   DG CHEST PORT 1 VIEW  Result Date: 09/26/2021 CLINICAL DATA:  Weakness/increased shortness of breath. EXAM: PORTABLE CHEST 1 VIEW COMPARISON:  February 06, 2021 FINDINGS: Low lung volumes. Bilateral streaky interstitial opacities in both lungs. Normal cardiac silhouette. Tortuosity and calcific atherosclerotic disease of the aorta. IMPRESSION: Bilateral streaky interstitial opacities, which may represent atypical/viral pneumonia. Electronically Signed   By: Ted Mcalpine M.D.   On: 09/26/2021 09:46   DG Chest Port 1V same Day  Result Date: 09/26/2021 CLINICAL DATA:  Post intubation EXAM: PORTABLE CHEST 1 VIEW COMPARISON:  Portable exam at 1027 hours compared to 0932 hours FINDINGS: Tip of endotracheal tube projects 3.8 cm above carina. Nasogastric tube extends into stomach. Normal heart size, mediastinal contours, and pulmonary vascularity. Lungs clear. No pleural effusion or pneumothorax. Bones demineralized. IMPRESSION: Tube positions as above. No acute abnormalities. Electronically Signed   By: Ulyses Southward M.D.   On: 09/26/2021 10:42   CT CHEST ABDOMEN PELVIS WO CONTRAST  Result Date: 09/25/2021 CLINICAL DATA:  Back pain after fall. EXAM: CT CHEST, ABDOMEN AND PELVIS WITHOUT CONTRAST TECHNIQUE: Multidetector CT imaging of the chest, abdomen and pelvis was performed following the standard protocol without IV contrast. RADIATION DOSE REDUCTION: This exam was performed according to the departmental dose-optimization program which includes automated exposure control, adjustment of the mA and/or kV  according to Betty Golden size and/or use of iterative reconstruction technique. COMPARISON:  April 07, 2019. FINDINGS: CT CHEST FINDINGS Cardiovascular: Atherosclerosis of thoracic aorta is noted without aneurysm formation. Normal cardiac size. No pericardial effusion. Mediastinum/Nodes: No enlarged mediastinal, hilar, or axillary lymph nodes. Thyroid gland, trachea, and esophagus demonstrate no significant findings. Lungs/Pleura: No pneumothorax or pleural effusion is noted. Minimal right posterior basilar subsegmental atelectasis or scarring is noted. Probable scarring is noted laterally in the right upper lobe. No definite acute abnormality is noted in left lung. Musculoskeletal: No chest wall mass or suspicious bone lesions identified. CT ABDOMEN PELVIS FINDINGS Hepatobiliary: No focal liver abnormality is seen. Status post cholecystectomy. No biliary dilatation. Pancreas: Unremarkable. No pancreatic ductal dilatation or surrounding inflammatory changes. Spleen: Normal in size without focal abnormality. Adrenals/Urinary Tract: Adrenal glands are unremarkable. Kidneys are normal, without renal calculi, focal lesion, or hydronephrosis. Bladder is unremarkable. Stomach/Bowel: The stomach appears normal. There is no evidence  of bowel obstruction or inflammation. Sigmoid diverticulosis is noted. Status post appendectomy. Vascular/Lymphatic: Aortic atherosclerosis. No enlarged abdominal or pelvic lymph nodes. Reproductive: Uterus and bilateral adnexa are unremarkable. Other: No abdominal wall hernia or abnormality. No abdominopelvic ascites. Musculoskeletal: No acute or significant osseous findings. IMPRESSION: No evidence of traumatic injury seen in the chest, abdomen or pelvis. Minimal right posterior basilar subsegmental atelectasis or scarring is noted. Probable scarring seen in right upper lobe. Sigmoid diverticulosis without inflammation. Aortic Atherosclerosis (ICD10-I70.0). Electronically Signed   By: Lupita Raider M.D.   On: 09/25/2021 16:30    ASSESSMENT & PLAN Betty Golden is a 76 y.o. female who presents to the clinic for evaluation for leukocytosis and normocytic anemia.   We reviewed possible causes for her leukocytosis including inflammatory process, infectious process, smoking, OSA, medications and myeloproliferative disorders. I suspect that Betty Golden's rheumatoid arthritis and steroid therapy is the underlying cause. We will proceed with serologic workup to check CBC, CMP, LDH, C-reactive protein, sedimentation rate, MPN panel and BCR/ABL FISH.   Regarding the normocytic anemia, we reviewed possible causes included nutritional deficiencies, medications, chronic disease/inflammation, chronic kidney disease and bone marrow disorders. Likely cause of Betty Golden's anemia is multifactorial with renal dysfunction and RA with chronic use of methotrexate. We will check for nutritional deficiencies with today's workup.   # Leukocytosis, neutrophil predominant: --Suspect underlying cause is Betty Golden's recent RA flare up and use of steroid therapy.  --Need to evaluate for inflammation by checking CRP and Sed rate --Need to rule out myeloproliferative disorders with MPN panel and BCR/ABL FISH  # Normocytic anemia: --Multifactorial with renal dysfunction, RA and use of methotrexate and simponi --Need to evaluate for iron, vitamin B12 and folate deficiencies  # Bruising: --Betty Golden has multiple site of bruising with palpable induration. --Likely secondary to subcutaneous heparin injections Betty Golden received during recent hospitalization per record review --Monitor for now.   #Constipation: --Recommend to be more aggressive with her bowel regimen. Consider OTC stool softeners/laxative.   Follow up: --RTC will be determined once workup is complete.   Orders Placed This Encounter  Procedures   CBC with Differential (Cancer Center Only)    Standing Status:   Future    Number of Occurrences:   1     Standing Expiration Date:   10/17/2022   CMP (Cancer Center only)    Standing Status:   Future    Number of Occurrences:   1    Standing Expiration Date:   10/17/2022   Lactate dehydrogenase (LDH)    Standing Status:   Future    Number of Occurrences:   1    Standing Expiration Date:   10/16/2022   Sedimentation rate    Standing Status:   Future    Number of Occurrences:   1    Standing Expiration Date:   10/16/2022   C-reactive protein    Standing Status:   Future    Number of Occurrences:   1    Standing Expiration Date:   10/16/2022   JAK2 (INCLUDING V617F AND EXON 12), MPL,& CALR W/RFL MPN PANEL ([facility])    Standing Status:   Future    Number of Occurrences:   1    Standing Expiration Date:   10/16/2022   BCR ABL1 FISH ([facility])    Standing Status:   Future    Number of Occurrences:   1    Standing Expiration Date:   10/17/2022   Vitamin B12    Standing Status:  Future    Number of Occurrences:   1    Standing Expiration Date:   10/16/2022   Methylmalonic acid, serum    Standing Status:   Future    Number of Occurrences:   1    Standing Expiration Date:   10/16/2022   Folate, Serum    Standing Status:   Future    Number of Occurrences:   1    Standing Expiration Date:   10/16/2022   Retic Panel    Standing Status:   Future    Number of Occurrences:   1    Standing Expiration Date:   10/17/2022   Ferritin    Standing Status:   Future    Number of Occurrences:   1    Standing Expiration Date:   10/17/2022   Iron and Iron Binding Capacity ([facility] only)    Standing Status:   Future    Number of Occurrences:   1    Standing Expiration Date:   10/17/2022    All questions were answered. The Betty Golden knows to call the clinic with any problems, questions or concerns.  I have spent a total of 60 minutes minutes of face-to-face and non-face-to-face time, preparing to see the Betty Golden, obtaining and/or reviewing separately obtained history, performing a medically appropriate  examination, counseling and educating the Betty Golden, ordering tests, documenting clinical information in the electronic health record, and care coordination.   Georga Kaufmann, PA-C Department of Hematology/Oncology [facility] Phone: [phone]

## 2021-10-17 LAB — FERRITIN: Ferritin: 687 ng/mL — ABNORMAL HIGH (ref 11–307)

## 2021-10-18 LAB — METHYLMALONIC ACID, SERUM: Methylmalonic Acid, Quantitative: 388 nmol/L — ABNORMAL HIGH (ref 0–378)

## 2021-10-23 LAB — JAK2 (INCLUDING V617F AND EXON 12), MPL,& CALR W/RFL MPN PANEL (NGS)

## 2021-10-23 LAB — BCR ABL1 FISH (GENPATH)

## 2021-10-24 ENCOUNTER — Telehealth: Payer: Self-pay | Admitting: Physician Assistant

## 2021-10-24 MED ORDER — VITAMIN B-12 1000 MCG PO TABS: 1000.0000 ug | ORAL_TABLET | Freq: Every day | ORAL | 3 refills | Status: DC

## 2021-10-24 NOTE — Telephone Encounter (Signed)
Scheduled per 3/29 secure chat, pt daughter has been called and confirmed ?

## 2021-10-24 NOTE — Telephone Encounter (Signed)
I called the patient's daughter, Ms. Twana First, to review the lab results from 10/16/2021. Findings revealed improvement of leukocytosis with WBC 23.4 to 14.9 and persistent anemia with Hgb 9.9. Due to elevated inflammatory markers, likely cause of leukocytosis is recent RA flare-up and steroid use. No evidence of myeloproliferative disorders. There is evidence of vitamin B12 deficiency.Recommend to start vitamin B12 supplementation so sent prescription to pharmacy on file. No indication for a bone marrow biopsy at this time. Patient will return in 3 months for a follow up to monitor labs closely.  ? ?Patient's daughter expressed understanding and satisfaction with the plan provided.  ?

## 2021-10-26 ENCOUNTER — Ambulatory Visit: Payer: Medicare Other | Admitting: Pulmonary Disease

## 2021-10-26 ENCOUNTER — Encounter: Payer: Self-pay | Admitting: Pulmonary Disease

## 2021-10-26 ENCOUNTER — Ambulatory Visit (HOSPITAL_COMMUNITY)
Admission: RE | Admit: 2021-10-26 | Discharge: 2021-10-26 | Disposition: A | Discharge status: Home / Self Care | Payer: Medicare Other | Source: Ambulatory Visit | Attending: Pulmonary Disease | Admitting: Pulmonary Disease

## 2021-10-26 VITALS — BP 132/78 | HR 96 | Temp 98.1°F | Ht 66.0 in | Wt 147.4 lb

## 2021-10-26 DIAGNOSIS — Z8701 Personal history of pneumonia (recurrent): Secondary | ICD-10-CM | POA: Diagnosis present

## 2021-10-26 DIAGNOSIS — J4540 Moderate persistent asthma, uncomplicated: Secondary | ICD-10-CM | POA: Insufficient documentation

## 2021-10-26 NOTE — Progress Notes (Signed)
? ?[facility] ? ?Chief Complaint  ?Patient presents with  ? Consult  ?  Ref by Dr. Dellis Anes for hypoxia  ? ? ?Past Surgical History:  ?She  has a past surgical history that includes Appendectomy (1965); Cholecystectomy (1975); Breast reduction surgery (1994); Tonsillectomy (1968); Colonoscopy (N/A, 08/19/2013); Lumbar laminectomy/decompression microdiscectomy (Bilateral, 09/20/2015); Breast biopsy; Sinus Surgery; fibromyalgia; rheumatoid arthritis; Colonoscopy (N/A, 08/26/2018); Esophagogastroduodenoscopy (N/A, 08/26/2018); and biopsy (08/26/2018). ? ?Past Medical History:  ?Rheumatoid arthritis, Asthma, C diff, Cervical spine stenosis, Chronic headaches, CAD, Depression, Diverticulosis, HTN, Fibromyalgia, GERD, HLD, IBS, CKD ? ?Constitutional:  ?BP 132/78 (BP Location: Left Arm, Patient Position: Sitting)   Pulse 96   Temp 98.1 ?F (36.7 ?C) (Temporal)   Ht 5\' 6"  (1.676 m)   Wt 147 lb 6.4 oz (66.9 kg)   SpO2 99% Comment: ra  BMI 23.79 kg/m?  ? ?Brief Summary:  ?Betty Golden is a 76 y.o. female former smoker with recent pneumonia. ?  ? ? ? ?Subjective:  ? ?She is here with her daughter. ? ?She was in hospital earlier this month for aspiration pneumonia and AKI with hypoxia and hypercapnia.  She required intubation. ? ?She is followed by Dr. Dellis Anes for asthma and rhinitis.  She is on fasenra.  She is followed by Dr. Deanne Coffer for rheumatoid arthritis and fibromyalgia.  Her medications for this are on hold until her blood counts stabilize. ? ?She was sent home with oxygen.  She gets fatigued and winded with walking.  Not having cough, wheeze, or sputum.  Her joints have been more sore.  She gets mild ankle swelling later in the day. ? ?She maintained SpO2 > 94% while walking on room air in office today. ? ?Physical Exam:  ? ?Appearance - well kempt, uses a walker ? ?ENMT - no sinus tenderness, no oral exudate, no LAN, Mallampati 3 airway, no stridor ? ?Respiratory -  equal breath sounds bilaterally, no wheezing or rales ? ?CV - s1s2 regular rate and rhythm, no murmurs ? ?Ext - no clubbing, no edema ? ?Skin - no rashes ? ?Psych - normal mood and affect ?  ?Pulmonary testing:  ?Spirometry 10/03/21 >> FEV1 1.33 (62%), FEV1% 78 ? ?Chest Imaging:  ?CT chest 09/25/21 >> ATX Rt base, scar RUL ? ?Sleep Tests:  ? ? ?Cardiac Tests:  ?Echo 04/17/19 >> EF 55% ? ?Social History:  ?She  reports that she quit smoking about 33 years ago. Her smoking use included cigarettes. She has a 20.00 pack-year smoking history. She has never used smokeless tobacco. She reports that she does not drink alcohol and does not use drugs. ? ?Family History:  ?Her family history includes Asthma in her brother, maternal aunt, maternal grandmother, mother, and sister; Diabetes in her maternal grandfather; Heart disease in her father, maternal grandmother, and mother; Hypertension in her brother, father, and mother; Stroke in her father and sister. ?  ? ? ?Assessment/Plan:  ? ?History of aspiration pneumonia complicated by acute hypoxia/hypercapnic respiratory failure. ?- clinically improved ?- maintained SpO2 on room air today at rest and with exertion ?- will arrange for chest xray and overnight oximetry on room air ? ?Moderate persistent asthma. ?- followed by Dr. Dellis Anes asthma/allergy ? ?Rheumatoid arthritis, Fibromyalgia. ?- followed by Dr. Deanne Coffer with rheumatology ? ?Time Spent Involved in Patient Care on Day of Examination:  ?34 minutes ? ?Follow up:  ? ?Patient Instructions  ?Will arrange for chest xray at [facility] ? ?Will arrange for overnight  oxygen test at home ? ?Will call to schedule follow up if needed after reviewing your test results ? ?Medication List:  ? ?Allergies as of 10/26/2021   ? ?   Reactions  ? Hydromorphone Anaphylaxis  ? Sulfa Antibiotics Rash  ? ?  ? ?  ?Medication List  ?  ? ?  ? Accurate as of October 26, 2021  8:51 AM. If you have any questions, ask your nurse or doctor.  ?  ?   ? ?  ? ?STOP taking these medications   ? ?Arnuity Ellipta 200 MCG/ACT Aepb ?Generic drug: Fluticasone Furoate ?Stopped by: Coralyn HellingVineet Alysiah Suppa, MD ?  ?predniSONE 10 MG tablet ?Commonly known as: DELTASONE ?Stopped by: Coralyn HellingVineet Raider Valbuena, MD ?  ?tamsulosin 0.4 MG Caps capsule ?Commonly known as: Flomax ?Stopped by: Coralyn HellingVineet Aaren Krog, MD ?  ? ?  ? ?TAKE these medications   ? ?albuterol 108 (90 Base) MCG/ACT inhaler ?Commonly known as: ProAir HFA ?Inhale 2 puffs into the lungs every 4 (four) hours as needed for wheezing or shortness of breath. ?  ?amLODipine 5 MG tablet ?Commonly known as: NORVASC ?Take 1 tablet (5 mg total) by mouth daily. ?  ?aspirin 81 MG chewable tablet ?Chew 1 tablet (81 mg total) by mouth daily. ?  ?DULoxetine 30 MG capsule ?Commonly known as: CYMBALTA ?Take 30 mg by mouth 2 (two) times daily. ?  ?Enbrel SureClick 50 MG/ML injection ?Generic drug: etanercept ?Inject into the skin once a week. ?  ?Fasenra 30 MG/ML Sosy ?Generic drug: Benralizumab ?Inject 1 mL (30 mg total) into the skin every 8 (eight) weeks. ?  ?fluticasone 50 MCG/ACT nasal spray ?Commonly known as: FLONASE ?SPRAY 2 SPRAYS INTO EACH NOSTRIL EVERY DAY ?  ?folic acid 1 MG tablet ?Commonly known as: FOLVITE ?Take 1 mg by mouth daily. ?  ?losartan-hydrochlorothiazide 100-12.5 MG tablet ?Commonly known as: HYZAAR ?Take 1 tablet by mouth daily. ?  ?methotrexate 2.5 MG tablet ?Commonly known as: RHEUMATREX ?SMARTSIG:3 Tablet(s) By Mouth Once a Week ?  ?montelukast 10 MG tablet ?Commonly known as: Singulair ?Take 1 tablet (10 mg total) by mouth at bedtime. ?  ?pantoprazole 40 MG tablet ?Commonly known as: PROTONIX ?Take 1 tablet (40 mg total) by mouth daily. ?  ?pregabalin 50 MG capsule ?Commonly known as: LYRICA ?Take 50 mg by mouth 2 (two) times daily. ?  ?saccharomyces boulardii 250 MG capsule ?Commonly known as: FLORASTOR ?Take 1 capsule (250 mg total) by mouth 2 (two) times daily. ?  ?simvastatin 20 MG tablet ?Commonly known as: ZOCOR ?Take 20 mg by  mouth at bedtime. ?  ?Trelegy Ellipta 200-62.5-25 MCG/ACT Aepb ?Generic drug: Fluticasone-Umeclidin-Vilant ?Inhale into the lungs. ?  ?vitamin B-12 1000 MCG tablet ?Commonly known as: CYANOCOBALAMIN ?Take 1 tablet (1,000 mcg total) by mouth daily. ?  ? ?  ? ? ?Signature:  ?Coralyn HellingVineet Stevin Bielinski, MD ?[facility] ?Pager - [phone] ?10/26/2021, 8:51 AM ?  ? ? ? ? ? ? ? ? ?

## 2021-10-26 NOTE — Patient Instructions (Signed)
Will arrange for chest xray at [facility] ? ?Will arrange for overnight oxygen test at home ? ?Will call to schedule follow up if needed after reviewing your test results ?

## 2021-10-31 ENCOUNTER — Ambulatory Visit: Payer: Medicare Other | Admitting: Allergy & Immunology

## 2021-10-31 VITALS — BP 122/76 | HR 89 | Temp 98.3°F | Resp 18 | Ht 64.0 in | Wt 147.0 lb

## 2021-10-31 DIAGNOSIS — J019 Acute sinusitis, unspecified: Secondary | ICD-10-CM

## 2021-10-31 DIAGNOSIS — J4550 Severe persistent asthma, uncomplicated: Secondary | ICD-10-CM

## 2021-10-31 DIAGNOSIS — J3089 Other allergic rhinitis: Secondary | ICD-10-CM

## 2021-10-31 MED ORDER — TRELEGY ELLIPTA 200-62.5-25 MCG/ACT IN AEPB: 1.0000 | INHALATION_SPRAY | Freq: Every morning | RESPIRATORY_TRACT | 5 refills | Status: DC

## 2021-10-31 MED ORDER — ALBUTEROL SULFATE HFA 108 (90 BASE) MCG/ACT IN AERS: 2.0000 | INHALATION_SPRAY | RESPIRATORY_TRACT | 3 refills | Status: DC | PRN

## 2021-10-31 MED ORDER — MONTELUKAST SODIUM 10 MG PO TABS: 10.0000 mg | ORAL_TABLET | Freq: Every day | ORAL | 5 refills | Status: DC

## 2021-10-31 NOTE — Progress Notes (Signed)
? ?FOLLOW UP ? ?Date of Service/Encounter:  10/31/21 ? ? ?Assessment:  ?  ?Severe persistent asthma without complication ?  ?Chronic rhinitis (last testing performed in [place]) ?  ?Recurrent infections - with normal workup in the past (May 2020) ?  ?Rheumatoid arthritis - recently transitioned to Enbrel ?  ?Fibromyalgia ?  ?History of stroke (September 2020) ?  ?Recent hospitalization for AKI - with improving SOB and malaise ? ?Plan/Recommendations:  ? ?1. Chronic nonseasonal allergic rhinitis ?- Continue with fluticasone two sprays per nostril once daily. ?- Increase too TWICE DAILY when you feel that your sinuses are acting up.  ?- Continue with cetirizine (Zyrtec).   ?  ?2. Moderate persistent asthma, uncomplicated ?- Lung function looks great today.  ?- We are not going to make any changes at this time.  ?- I hope we get to do the oxygen study.  ?- Daily controller medication(s):  Fasenra every 8 weeks  + Trelegy one puff once daily + Singulair  ?- Prior to physical activity: albuterol 2 puffs 10-15 minutes before physical activity. ?- Rescue medications: albuterol 4 puffs every 4-6 hours as needed ?- Changes during respiratory infections or worsening symptoms: Increase Arnuity 1 puff twice daily for TWO WEEKS. ?- Asthma control goals:  ?* Full participation in all desired activities (may need albuterol before activity) ?* Albuterol use two time or less a week on average (not counting use with activity) ?* Cough interfering with sleep two time or less a month ?* Oral steroids no more than once a year ?* No hospitalizations ? ?3. Return in about 3 months (around 01/30/2022).  ? ?Subjective:  ? ?Betty Golden is a 76 y.o. female presenting today for follow up of  ?Chief Complaint  ?Patient presents with  ? Asthma  ?  Says it has been good.   ? ? ?Betty Golden has a history of the following: ?Patient Active Problem List  ? Diagnosis Date Noted  ? Malnutrition of moderate degree 09/27/2021  ?  Acute respiratory failure with hypoxia (HCC)   ? Acute urinary retention 09/26/2021  ? Aspiration pneumonia (HCC) 09/26/2021  ? RA (rheumatoid arthritis) (HCC) 09/25/2021  ? Leukocytosis 09/25/2021  ? Viral pneumonia 04/20/2019  ? TIA (transient ischemic attack) 04/16/2019  ? Dyspnea 04/04/2019  ? COVID-19 virus infection 04/04/2019  ? Gastritis and gastroduodenitis 07/08/2018  ? Rectal bleeding 06/29/2018  ? Fibromyalgia 09/08/2017  ? Spinal stenosis of lumbar region 09/20/2015  ? 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 Moderate persistent asthma 07/03/2015  ? Allergic rhinitis 07/03/2015  ? Colitis due to Clostridium difficile 02/10/2015  ? Abdominal pain 02/10/2015  ? Nausea and vomiting 02/10/2015  ? AKI (acute kidney injury) (HCC) 02/10/2015  ? Dehydration 02/10/2015  ? Hypoxia 02/10/2015  ? Hyponatremia 02/10/2015  ? Hypokalemia 02/10/2015  ? COPD (chronic obstructive pulmonary disease) (HCC)   ? Unspecified constipation 07/15/2013  ? DYSPNEA 04/05/2008  ? Essential hypertension 03/10/2008  ? Headache(784.0) 03/10/2008  ? Cough 03/10/2008  ? ? ?History obtained from: chart review and patient and her daughter . ? ?Betty Golden is a 76 y.o. female presenting for a follow up visit.  We last saw her in March 2023.  At that time, we continue with Flonase 2 sprays per nostril daily, increasing to twice daily when her sinuses were acting up.  We continue with cetirizine.  We did not do lung function.  We started Trelegy instead of Arnuity and continue with Fasenra every 8 weeks as well as  Singulair.   ? ?We did refer her to see Dr. Craige Cotta due to her history of hypoxia.  He did order a chest x-ray as well as an overnight oxygen test.  This was to see whether she needed oxygen or not. ? ?Since the last visit, she has largely done well.  As usual, she is fairly upbeat about her prognosis and her clinical state.  Her daughter does not share the optimism. ? ?Asthma/Respiratory Symptom History: She remains on the Trelegy one puff once daily.  She takes it in the morning and she has not needed her emergency inhaler. This is working well to control her symptoms. Her O2 study pending.  Apparently the pharmacy only had a couple of devices to use for that particular test and they were already checked out.  She is up-to-date on her [employer] Challenger.  She is having no problems with that at all. ? ?Allergic Rhinitis Symptom History: Her allergic rhinitis is under good control with Flonase and cetirizine.  She has not required antibiotics or prednisone since last time we saw her.  Infectious history has been unremarkable. ? ?She remains in physical therapy and he is coming once per week. OT comes twice weekly. She is doing better with the activities of daily living. She has a seat and bars in the shower.  Her daughter also stays in the bathroom with her when she showers in case she has an episode of falling.  However, with the bar and the stool in the bathroom, she feels pretty safe.  Her OT also works with her on showering appropriately and safely. ? ?Otherwise, there have been no changes to her past medical history, surgical history, family history, or social history. ? ? ? ?Review of Systems  ?Constitutional: Negative.  Negative for chills, fever, malaise/fatigue and weight loss.  ?HENT:  Negative for congestion, ear discharge, ear pain and sinus pain.   ?Eyes:  Negative for pain, discharge and redness.  ?Respiratory:  Negative for cough, sputum production, shortness of breath and wheezing.   ?Cardiovascular: Negative.  Negative for chest pain and palpitations.  ?Gastrointestinal:  Negative for abdominal pain, constipation, diarrhea, heartburn, nausea and vomiting.  ?Skin: Negative.  Negative for itching and rash.  ?Neurological:  Negative for dizziness and headaches.  ?Endo/Heme/Allergies:  Positive for environmental allergies. Does not bruise/bleed easily.   ? ? ? ?Objective:  ? ?Blood pressure 122/76, pulse 89, temperature 98.3 ?F (36.8 ?C), temperature source  Temporal, resp. rate 18, height  (1.626 m), weight 147 lb (66.7 kg), SpO2 98 %. ?Body mass index is 25.23 kg/m?. ? ? ? ?Physical Exam ?Vitals reviewed.  ?Constitutional:   ?   Appearance: She is well-developed.  ?   Comments: Talkative. In a walker today rather than a wheelchair.   ?HENT:  ?   Head: Normocephalic and atraumatic.  ?   Right Ear: Tympanic membrane, ear canal and external ear normal.  ?   Left Ear: Tympanic membrane, ear canal and external ear normal.  ?   Nose: No nasal deformity, septal deviation, mucosal edema or rhinorrhea.  ?   Right Turbinates: Enlarged, swollen and pale.  ?   Left Turbinates: Enlarged, swollen and pale.  ?   Right Sinus: No maxillary sinus tenderness or frontal sinus tenderness.  ?   Left Sinus: No maxillary sinus tenderness or frontal sinus tenderness.  ?   Comments: Dried rhinorrhea bilaterally.  ?   Mouth/Throat:  ?   Mouth: Mucous membranes are not pale and  not dry.  ?   Pharynx: Uvula midline.  ?Eyes:  ?   General: Lids are normal. No allergic shiner.    ?   Right eye: No discharge.     ?   Left eye: No discharge.  ?   Conjunctiva/sclera: Conjunctivae normal.  ?   Right eye: Right conjunctiva is not injected. No chemosis. ?   Left eye: Left conjunctiva is not injected. No chemosis. ?   Pupils: Pupils are equal, round, and reactive to light.  ?Cardiovascular:  ?   Rate and Rhythm: Normal rate and regular rhythm.  ?   Heart sounds: Normal heart sounds.  ?Pulmonary:  ?   Effort: Pulmonary effort is normal. No tachypnea, accessory muscle usage or respiratory distress.  ?   Breath sounds: Normal breath sounds. No wheezing, rhonchi or rales.  ?   Comments: Moving air well in all lung fields.  No increased work of breathing. ?Chest:  ?   Chest wall: No tenderness.  ?Musculoskeletal:  ?   Right hand: Swelling and deformity present.  ?   Left hand: Swelling and deformity present.  ?Lymphadenopathy:  ?   Cervical: No cervical adenopathy.  ?Skin: ?   General: Skin is warm.  ?    Capillary Refill: Capillary refill takes less than 2 seconds.  ?   Coloration: Skin is not pale.  ?   Findings: No abrasion, erythema, petechiae or rash. Rash is not papular, urticarial or vesicular.  ?

## 2021-10-31 NOTE — Patient Instructions (Addendum)
1. Chronic nonseasonal allergic rhinitis ?- Continue with fluticasone two sprays per nostril once daily. ?- Increase too TWICE DAILY when you feel that your sinuses are acting up.  ?- Continue with cetirizine (Zyrtec).   ?  ?2. Moderate persistent asthma, uncomplicated ?- Lung function looks great today.  ?- We are not going to make any changes at this time.  ?- I hope we get to do the oxygen study.  ?- Daily controller medication(s):  Fasenra every 8 weeks  + Trelegy one puff once daily + Singulair 10mg  ?- Prior to physical activity: albuterol 2 puffs 10-15 minutes before physical activity. ?- Rescue medications: albuterol 4 puffs every 4-6 hours as needed ?- Changes during respiratory infections or worsening symptoms: Increase Arnuity 38mcg 1 puff twice daily for TWO WEEKS. ?- Asthma control goals:  ?* Full participation in all desired activities (may need albuterol before activity) ?* Albuterol use two time or less a week on average (not counting use with activity) ?* Cough interfering with sleep two time or less a month ?* Oral steroids no more than once a year ?* No hospitalizations ? ?3. Return in about 3 months (around 01/30/2022).  ? ? ?Please inform us of any Emergency Department visits, hospitalizations, or changes in symptoms. Call us before going to the ED for breathing or allergy symptoms since we might be able to fit you in for a sick visit. Feel free to contact us anytime with any questions, problems, or concerns. ? ?It was a pleasure to see you and your family again today! ? ?Websites that have reliable patient information: ?1. American Academy of Asthma, Allergy, and Immunology: www.aaaai.org ?2. Food Allergy Research and Education (FARE): foodallergy.org ?3. Mothers of Asthmatics: http://www.asthmacommunitynetwork.org ?4. [employer] of Allergy, Asthma, and Immunology: MonthlyElectricBill.co.uk ? ? ?COVID-19 Vaccine Information can be found at:  ShippingScam.co.uk For questions related to vaccine distribution or appointments, please email [email or web address] or call [phone].  ? ?We realize that you might be concerned about having an allergic reaction to the COVID19 vaccines. To help with that concern, WE ARE OFFERING THE COVID19 VACCINES IN OUR OFFICE! Ask the front desk for dates!  ? ? ? ??Like? [place] on Facebook and Instagram for our latest updates!  ?  ? ? ?A healthy democracy works best when [employer] participate! Make sure you are registered to vote! If you have moved or changed any of your contact information, you will need to get this updated before voting! ? ?In some cases, you MAY be able to register to vote online: [email or web address] ? ? ? ? ? ? ? ? ? ?

## 2021-11-01 ENCOUNTER — Encounter: Payer: Self-pay | Admitting: Allergy & Immunology

## 2021-11-13 ENCOUNTER — Ambulatory Visit: Payer: Medicare Other | Admitting: Urology

## 2021-11-13 ENCOUNTER — Telehealth: Payer: Self-pay | Admitting: Pulmonary Disease

## 2021-11-13 ENCOUNTER — Encounter: Payer: Self-pay | Admitting: Urology

## 2021-11-13 VITALS — BP 132/79 | HR 97 | Ht 64.0 in | Wt 147.0 lb

## 2021-11-13 DIAGNOSIS — Z87440 Personal history of urinary (tract) infections: Secondary | ICD-10-CM

## 2021-11-13 DIAGNOSIS — R8290 Unspecified abnormal findings in urine: Secondary | ICD-10-CM

## 2021-11-13 DIAGNOSIS — R339 Retention of urine, unspecified: Secondary | ICD-10-CM | POA: Diagnosis not present

## 2021-11-13 DIAGNOSIS — Z87898 Personal history of other specified conditions: Secondary | ICD-10-CM

## 2021-11-13 LAB — MICROSCOPIC EXAMINATION
Renal Epithel, UA: NONE SEEN /hpf
WBC, UA: >30 /hpf — AB (ref 0–5)

## 2021-11-13 LAB — URINALYSIS, ROUTINE W REFLEX MICROSCOPIC
Bilirubin, UA: NEGATIVE
Glucose, UA: NEGATIVE
Ketones, UA: NEGATIVE
Nitrite, UA: NEGATIVE
Protein,UA: NEGATIVE
Specific Gravity, UA: 1.01 (ref 1.005–1.030)
Urobilinogen, Ur: 0.2 mg/dL (ref 0.2–1.0)
pH, UA: 6 (ref 5.0–7.5)

## 2021-11-13 LAB — BLADDER SCAN AMB NON-IMAGING: Scan Result: 2

## 2021-11-13 MED ORDER — CIPROFLOXACIN HCL 500 MG PO TABS: 500.0000 mg | ORAL_TABLET | Freq: Two times a day (BID) | ORAL | 0 refills | Status: DC

## 2021-11-13 NOTE — Telephone Encounter (Signed)
ONO with RA 11/08/21 >> test time 6 hrs 33 min.  Mean SpO2 95%, low SpO2 84%.  Spent 6 min 56 sec with SpO2 < 88%. ? ? ?Please let her know her oxygen test shows her oxygen level is good.  She does not need to use supplemental oxygen at night. ?

## 2021-11-13 NOTE — Progress Notes (Signed)
? ?Assessment: ?1. History of urinary retention   ?2. History of UTI   ? ? ?Plan: ?Urine culture sent today. ?Cipro 500 mg twice daily x3 days.  Prescription sent. ?Return to office in 6 weeks. ? ?Chief Complaint:  ?Chief Complaint  ?Patient presents with  ? Urinary Retention  ? ? ?History of Present Illness: ? ?Betty Golden is a 76 y.o. year old female who is seen for evaluation of further acute urinary retention.  She presented to the hospital on 09/25/2021 with confusion after a fall.  Evaluation demonstrated a creatinine of 3.93.  Urinalysis showed 11-20 WBCs, 0-5 RBCs, and few bacteria.  CT from 09/25/2021 did not show any evidence of hydronephrosis or obstruction.  She required in and out catheterization during her hospitalization and a Foley was placed on 09/27/2021.  Her Foley was ultimately removed on 09/29/2021 and the patient was able to void spontaneously.  She was started on tamsulosin 0.4 mg daily.  Her creatinine improved to 1.2 prior to discharge. ?She has been voiding spontaneously since her discharge from the hospital.  No dysuria or gross hematuria.  No recent UTIs.  She reports problems with dehydration associated with flareups of her fibromyalgia. ?PVR from 3/23: 0 mL ?Urine culture from 3/23: 50-100 K E. coli.  Treated with Omnicef. ?The tamsulosin was discontinued. ? ?She returns today for follow-up.  She has been voiding spontaneously since her last visit.  She feels like she is emptying her bladder well.  She has noted some dysuria in the past 1-2 weeks.  No gross hematuria or flank pain. ? ?Portions of the above documentation were copied from a prior visit for review purposes only. ? ?Past Medical History:  ?Past Medical History:  ?Diagnosis Date  ? Asthma   ? C. difficile colitis   ? Cervical stenosis of spine   ? Chronic headaches   ? Chronic neck pain   ? Coronary artery disease   ? Depression   ? Diverticulosis   ? Essential hypertension   ? Fibromyalgia   ? GERD (gastroesophageal  reflux disease)   ? History of Salmonella gastroenteritis   ? HNP (herniated nucleus pulposus), lumbar   ? Hyperlipidemia   ? IBS (irritable bowel syndrome)   ? Lung nodule   ? rheumatoid arthritis   ? ? ?Past Surgical History:  ?Past Surgical History:  ?Procedure Laterality Date  ? APPENDECTOMY  1965  ? BIOPSY  08/26/2018  ? Procedure: BIOPSY;  Surgeon: Malissa Hippo, MD;  Location: [facility];  Service: Endoscopy;;  gastric  ? BREAST BIOPSY    ? BREAST REDUCTION SURGERY  1994  ? CHOLECYSTECTOMY  1975  ? COLONOSCOPY N/A 08/19/2013  ? Procedure: COLONOSCOPY;  Surgeon: Malissa Hippo, MD;  Location: [facility];  Service: Endoscopy;  Laterality: N/A;  155-moved to 140 Ann to notify pt  ? COLONOSCOPY N/A 08/26/2018  ? Procedure: COLONOSCOPY;  Surgeon: Malissa Hippo, MD;  Location: [facility];  Service: Endoscopy;  Laterality: N/A;  ? ESOPHAGOGASTRODUODENOSCOPY N/A 08/26/2018  ? Procedure: ESOPHAGOGASTRODUODENOSCOPY (EGD);  Surgeon: Malissa Hippo, MD;  Location: [facility];  Service: Endoscopy;  Laterality: N/A;  ? fibromyalgia    ? LUMBAR LAMINECTOMY/DECOMPRESSION MICRODISCECTOMY Bilateral 09/20/2015  ? Procedure: MICRO LUMBAR DECOMPRESSION L5-S1 BILATERAL    (1 LEVEL);  Surgeon: [name], MD;  Location: [facility];  Service: Orthopedics;  Laterality: Bilateral;  ? rheumatoid arthritis    ? Sinus Surgery    ? TONSILLECTOMY  1968  ? ? ?  Allergies:  ?Allergies  ?Allergen Reactions  ? Hydromorphone Anaphylaxis  ? Sulfa Antibiotics Rash  ? ? ?Family History:  ?Family History  ?Problem Relation Age of Onset  ? Heart disease Mother   ? Hypertension Mother   ? Asthma Mother   ? Heart disease Father   ? Stroke Father   ? Hypertension Father   ? Stroke Sister   ? Asthma Sister   ? Hypertension Brother   ? Asthma Brother   ? Asthma Maternal Grandmother   ? Heart disease Maternal Grandmother   ? Diabetes Maternal Grandfather   ? Asthma Maternal Aunt   ? Colon cancer Neg Hx   ? ? ?Social History:  ?Social History   ? ?Tobacco Use  ? Smoking status: Former  ?  Packs/day: 2.00  ?  Years: 10.00  ?  Pack years: 20.00  ?  Types: Cigarettes  ?  Quit date: 09/11/1988  ?  Years since quitting: 33.1  ? Smokeless tobacco: Never  ? Tobacco comments:  ?  quit 1990  ?Vaping Use  ? Vaping Use: Never used  ?Substance Use Topics  ? Alcohol use: No  ?  Comment: stopped in 1990  ? Drug use: No  ?  Comment: hx smoking marijuana and cocaine weekends. Stopped in the 1990s.  ? ? ?ROS: ?Constitutional:  Negative for fever, chills, weight loss ?CV: Negative for chest pain, previous MI, hypertension ?Respiratory:  Negative for shortness of breath, wheezing, sleep apnea, frequent cough ?GI:  Negative for nausea, vomiting, bloody stool, GERD ? ?Physical exam: ?BP 132/79   Pulse 97   Ht 5\' 4"  (1.626 m)   Wt 147 lb (66.7 kg)   BMI 25.23 kg/m?  ?GENERAL APPEARANCE:  Well appearing, well developed, well nourished, NAD ?HEENT:  Atraumatic, normocephalic, oropharynx clear ?NECK:  Supple without lymphadenopathy or thyromegaly ?ABDOMEN:  Soft, non-tender, no masses ?EXTREMITIES:  Moves all extremities well, without clubbing, cyanosis, or edema ?NEUROLOGIC:  Alert and oriented x 3, normal gait, CN II-XII grossly intact ?MENTAL STATUS:  appropriate ?BACK:  Non-tender to palpation, No CVAT ?SKIN:  Warm, dry, and intact ? ?Results: ?U/A:  >30 WBC, 0-2 RBC, few bacteria ? ?PVR = 2 mL ? ?

## 2021-11-13 NOTE — Progress Notes (Signed)
post void residual =2mL 

## 2021-11-14 NOTE — Telephone Encounter (Signed)
Called and went over results with patients daughter Elmyra Ricks (ok per dpr). She voiced understanding and will relay results to patient. Advised her to have patient call back for further questions  ?

## 2021-11-15 LAB — URINE CULTURE

## 2021-11-15 MED ORDER — CIPROFLOXACIN HCL 500 MG PO TABS: 500.0000 mg | ORAL_TABLET | Freq: Two times a day (BID) | ORAL | 0 refills | Status: AC

## 2021-11-15 NOTE — Addendum Note (Signed)
Addended by: Milderd Meager on: 11/15/2021 04:36 PM ? ? Modules accepted: Orders ? ?

## 2021-11-16 NOTE — Progress Notes (Signed)
Patient notified and voiced understanding.

## 2021-11-26 ENCOUNTER — Institutional Professional Consult (permissible substitution): Payer: Medicare Other | Admitting: Pulmonary Disease

## 2021-11-28 ENCOUNTER — Ambulatory Visit: Payer: Medicare Other

## 2021-12-05 ENCOUNTER — Ambulatory Visit (INDEPENDENT_AMBULATORY_CARE_PROVIDER_SITE_OTHER): Payer: Medicare Other

## 2021-12-05 DIAGNOSIS — J4550 Severe persistent asthma, uncomplicated: Secondary | ICD-10-CM

## 2021-12-25 ENCOUNTER — Ambulatory Visit: Payer: Medicare Other | Admitting: Urology

## 2021-12-25 DIAGNOSIS — Z87898 Personal history of other specified conditions: Secondary | ICD-10-CM

## 2021-12-25 DIAGNOSIS — Z87440 Personal history of urinary (tract) infections: Secondary | ICD-10-CM

## 2021-12-25 NOTE — Progress Notes (Deleted)
Assessment: 1. History of UTI   2. History of urinary retention     Plan: Urine culture sent today. Cipro 500 mg twice daily x3 days.  Prescription sent. Return to office in 6 weeks.  Chief Complaint:  No chief complaint on file.   History of Present Illness:  Betty Golden is a 76 y.o. year old female who is seen for evaluation of further acute urinary retention.  She presented to the hospital on 09/25/2021 with confusion after a fall.  Evaluation demonstrated a creatinine of 3.93.  Urinalysis showed 11-20 WBCs, 0-5 RBCs, and few bacteria.  CT from 09/25/2021 did not show any evidence of hydronephrosis or obstruction.  She required in and out catheterization during her hospitalization and a Foley was placed on 09/27/2021.  Her Foley was ultimately removed on 09/29/2021 and the patient was able to void spontaneously.  She was started on tamsulosin 0.4 mg daily.  Her creatinine improved to 1.2 prior to discharge. She has been voiding spontaneously since her discharge from the hospital.  No dysuria or gross hematuria.  No recent UTIs.  She reports problems with dehydration associated with flareups of her fibromyalgia. PVR from 3/23: 0 mL Urine culture from 3/23: 50-100 K E. coli.  Treated with Omnicef. The tamsulosin was discontinued.  At her visit in 4/23, she had been voiding spontaneously since her prior visit.  She felt like she was emptying her bladder well.  She had noted some dysuria in the past 1-2 weeks.  No gross hematuria or flank pain. Urine culture from 4/23 grew >100 K E. coli.  She was treated with Cipro.  Portions of the above documentation were copied from a prior visit for review purposes only.  Past Medical History:  Past Medical History:  Diagnosis Date   Asthma    C. difficile colitis    Cervical stenosis of spine    Chronic headaches    Chronic neck pain    Coronary artery disease    Depression    Diverticulosis    Essential hypertension    Fibromyalgia     GERD (gastroesophageal reflux disease)    History of Salmonella gastroenteritis    HNP (herniated nucleus pulposus), lumbar    Hyperlipidemia    IBS (irritable bowel syndrome)    Lung nodule    rheumatoid arthritis     Past Surgical History:  Past Surgical History:  Procedure Laterality Date   APPENDECTOMY  1965   BIOPSY  08/26/2018   Procedure: BIOPSY;  Surgeon: Malissa Hippo, MD;  [facility]: [facility];  Service: Endoscopy;;  gastric   BREAST BIOPSY     BREAST REDUCTION SURGERY  1994   CHOLECYSTECTOMY  1975   COLONOSCOPY N/A 08/19/2013   Procedure: COLONOSCOPY;  Surgeon: Malissa Hippo, MD;  [facility]: [facility];  Service: Endoscopy;  Laterality: N/A;  155-moved to 140 Ann to notify pt   COLONOSCOPY N/A 08/26/2018   Procedure: COLONOSCOPY;  Surgeon: Malissa Hippo, MD;  [facility];  Service: Endoscopy;  Laterality: N/A;   ESOPHAGOGASTRODUODENOSCOPY N/A 08/26/2018   Procedure: ESOPHAGOGASTRODUODENOSCOPY (EGD);  Surgeon: Malissa Hippo, MD;  [facility];  Service: Endoscopy;  Laterality: N/A;   fibromyalgia     LUMBAR LAMINECTOMY/DECOMPRESSION MICRODISCECTOMY Bilateral 09/20/2015   Procedure: MICRO LUMBAR DECOMPRESSION L5-S1 BILATERAL    (1 LEVEL);  Surgeon: [name], MD;  Location: [facility];  Service: Orthopedics;  Laterality: Bilateral;   rheumatoid arthritis     Sinus  Surgery     TONSILLECTOMY  1968    Allergies:  Allergies  Allergen Reactions   Hydromorphone Anaphylaxis   Sulfa Antibiotics Rash    Family History:  Family History  Problem Relation Age of Onset   Heart disease Mother    Hypertension Mother    Asthma Mother    Heart disease Father    Stroke Father    Hypertension Father    Stroke Sister    Asthma Sister    Hypertension Brother    Asthma Brother    Asthma Maternal Grandmother    Heart disease Maternal Grandmother    Diabetes Maternal Grandfather    Asthma Maternal Aunt    Colon cancer Neg Hx     Social  History:  Social History   Tobacco Use   Smoking status: Former    Packs/day: 2.00    Years: 10.00    Pack years: 20.00    Types: Cigarettes    Quit date: 09/11/1988    Years since quitting: 33.3   Smokeless tobacco: Never   Tobacco comments:    quit 1990  Vaping Use   Vaping Use: Never used  Substance Use Topics   Alcohol use: No    Comment: stopped in 1990   Drug use: No    Comment: hx smoking marijuana and cocaine weekends. Stopped in the 1990s.    ROS: Constitutional:  Negative for fever, chills, weight loss CV: Negative for chest pain, previous MI, hypertension Respiratory:  Negative for shortness of breath, wheezing, sleep apnea, frequent cough GI:  Negative for nausea, vomiting, bloody stool, GERD  Physical exam: There were no vitals taken for this visit. GENERAL APPEARANCE:  Well appearing, well developed, well nourished, NAD HEENT:  Atraumatic, normocephalic, oropharynx clear NECK:  Supple without lymphadenopathy or thyromegaly ABDOMEN:  Soft, non-tender, no masses EXTREMITIES:  Moves all extremities well, without clubbing, cyanosis, or edema NEUROLOGIC:  Alert and oriented x 3, normal gait, CN II-XII grossly intact MENTAL STATUS:  appropriate BACK:  Non-tender to palpation, No CVAT SKIN:  Warm, dry, and intact  Results: U/A:

## 2022-01-17 ENCOUNTER — Other Ambulatory Visit: Payer: Self-pay | Admitting: *Deleted

## 2022-01-17 MED ORDER — FASENRA 30 MG/ML ~~LOC~~ SOSY: 30.0000 mg | PREFILLED_SYRINGE | SUBCUTANEOUS | 6 refills | Status: DC

## 2022-01-18 ENCOUNTER — Encounter: Payer: Self-pay | Admitting: Urology

## 2022-01-18 ENCOUNTER — Ambulatory Visit (INDEPENDENT_AMBULATORY_CARE_PROVIDER_SITE_OTHER): Payer: Medicare Other | Admitting: Urology

## 2022-01-18 VITALS — BP 117/70 | HR 94

## 2022-01-18 DIAGNOSIS — R339 Retention of urine, unspecified: Secondary | ICD-10-CM | POA: Diagnosis not present

## 2022-01-18 DIAGNOSIS — Z87440 Personal history of urinary (tract) infections: Secondary | ICD-10-CM | POA: Diagnosis not present

## 2022-01-18 DIAGNOSIS — Z87898 Personal history of other specified conditions: Secondary | ICD-10-CM

## 2022-01-18 LAB — BLADDER SCAN AMB NON-IMAGING: Scan Result: 23

## 2022-01-18 LAB — MICROSCOPIC EXAMINATION
RBC, Urine: NONE SEEN /hpf (ref 0–2)
Renal Epithel, UA: NONE SEEN /hpf

## 2022-01-18 LAB — URINALYSIS, ROUTINE W REFLEX MICROSCOPIC
Bilirubin, UA: NEGATIVE
Glucose, UA: NEGATIVE
Ketones, UA: NEGATIVE
Nitrite, UA: NEGATIVE
Protein,UA: NEGATIVE
RBC, UA: NEGATIVE
Specific Gravity, UA: 1.015 (ref 1.005–1.030)
Urobilinogen, Ur: 0.2 mg/dL (ref 0.2–1.0)
pH, UA: 7 (ref 5.0–7.5)

## 2022-01-22 ENCOUNTER — Other Ambulatory Visit: Payer: Self-pay | Admitting: Physician Assistant

## 2022-01-22 DIAGNOSIS — D649 Anemia, unspecified: Secondary | ICD-10-CM

## 2022-01-23 ENCOUNTER — Inpatient Hospital Stay: Payer: Medicare Other | Admitting: Physician Assistant

## 2022-01-23 ENCOUNTER — Inpatient Hospital Stay: Payer: Medicare Other

## 2022-01-30 ENCOUNTER — Ambulatory Visit: Payer: Medicare Other

## 2022-02-06 ENCOUNTER — Ambulatory Visit (INDEPENDENT_AMBULATORY_CARE_PROVIDER_SITE_OTHER): Payer: Medicare Other

## 2022-02-06 DIAGNOSIS — J4550 Severe persistent asthma, uncomplicated: Secondary | ICD-10-CM | POA: Diagnosis not present

## 2022-02-08 ENCOUNTER — Inpatient Hospital Stay: Payer: Medicare Other | Attending: Physician Assistant

## 2022-02-08 ENCOUNTER — Other Ambulatory Visit: Payer: Self-pay

## 2022-02-08 ENCOUNTER — Inpatient Hospital Stay (HOSPITAL_BASED_OUTPATIENT_CLINIC_OR_DEPARTMENT_OTHER): Payer: Medicare Other | Admitting: Physician Assistant

## 2022-02-08 ENCOUNTER — Other Ambulatory Visit: Payer: Self-pay | Admitting: Physician Assistant

## 2022-02-08 DIAGNOSIS — Z7982 Long term (current) use of aspirin: Secondary | ICD-10-CM | POA: Insufficient documentation

## 2022-02-08 DIAGNOSIS — D72828 Other elevated white blood cell count: Secondary | ICD-10-CM

## 2022-02-08 DIAGNOSIS — D72829 Elevated white blood cell count, unspecified: Secondary | ICD-10-CM | POA: Insufficient documentation

## 2022-02-08 DIAGNOSIS — Z79899 Other long term (current) drug therapy: Secondary | ICD-10-CM | POA: Insufficient documentation

## 2022-02-08 DIAGNOSIS — D649 Anemia, unspecified: Secondary | ICD-10-CM | POA: Diagnosis not present

## 2022-02-08 DIAGNOSIS — E538 Deficiency of other specified B group vitamins: Secondary | ICD-10-CM | POA: Insufficient documentation

## 2022-02-08 DIAGNOSIS — M797 Fibromyalgia: Secondary | ICD-10-CM | POA: Diagnosis not present

## 2022-02-08 DIAGNOSIS — R0602 Shortness of breath: Secondary | ICD-10-CM | POA: Diagnosis not present

## 2022-02-08 DIAGNOSIS — Z87891 Personal history of nicotine dependence: Secondary | ICD-10-CM | POA: Diagnosis not present

## 2022-02-08 DIAGNOSIS — M069 Rheumatoid arthritis, unspecified: Secondary | ICD-10-CM | POA: Insufficient documentation

## 2022-02-08 LAB — CBC WITH DIFFERENTIAL (CANCER CENTER ONLY)
Abs Immature Granulocytes: 0.04 10*3/uL (ref 0.00–0.07)
Basophils Absolute: 0 10*3/uL (ref 0.0–0.1)
Basophils Relative: 0 %
Eosinophils Absolute: 0 10*3/uL (ref 0.0–0.5)
Eosinophils Relative: 0 %
HCT: 35.1 % — ABNORMAL LOW (ref 36.0–46.0)
Hemoglobin: 11.8 g/dL — ABNORMAL LOW (ref 12.0–15.0)
Immature Granulocytes: 0 %
Lymphocytes Relative: 45 %
Lymphs Abs: 6.3 10*3/uL — ABNORMAL HIGH (ref 0.7–4.0)
MCH: 28.6 pg (ref 26.0–34.0)
MCHC: 33.6 g/dL (ref 30.0–36.0)
MCV: 85 fL (ref 80.0–100.0)
Monocytes Absolute: 0.9 10*3/uL (ref 0.1–1.0)
Monocytes Relative: 6 %
Neutro Abs: 6.7 10*3/uL (ref 1.7–7.7)
Neutrophils Relative %: 49 %
Platelet Count: 285 10*3/uL (ref 150–400)
RBC: 4.13 MIL/uL (ref 3.87–5.11)
RDW: 14.5 % (ref 11.5–15.5)
WBC Count: 14 10*3/uL — ABNORMAL HIGH (ref 4.0–10.5)
nRBC: 0 % (ref 0.0–0.2)

## 2022-02-08 LAB — CMP (CANCER CENTER ONLY)
ALT: 9 U/L (ref 0–44)
AST: 16 U/L (ref 15–41)
Albumin: 4 g/dL (ref 3.5–5.0)
Alkaline Phosphatase: 68 U/L (ref 38–126)
Anion gap: 5 (ref 5–15)
BUN: 18 mg/dL (ref 8–23)
CO2: 34 mmol/L — ABNORMAL HIGH (ref 22–32)
Calcium: 10 mg/dL (ref 8.9–10.3)
Chloride: 101 mmol/L (ref 98–111)
Creatinine: 1.23 mg/dL — ABNORMAL HIGH (ref 0.44–1.00)
GFR, Estimated: 46 mL/min — ABNORMAL LOW (ref >60)
Glucose, Bld: 90 mg/dL (ref 70–99)
Potassium: 3.1 mmol/L — ABNORMAL LOW (ref 3.5–5.1)
Sodium: 140 mmol/L (ref 135–145)
Total Bilirubin: 0.5 mg/dL (ref 0.3–1.2)
Total Protein: 7.4 g/dL (ref 6.5–8.1)

## 2022-02-08 LAB — VITAMIN B12: Vitamin B-12: 378 pg/mL (ref 180–914)

## 2022-02-10 LAB — METHYLMALONIC ACID, SERUM: Methylmalonic Acid, Quantitative: 243 nmol/L (ref 0–378)

## 2022-02-10 MED ORDER — POTASSIUM CHLORIDE CRYS ER 20 MEQ PO TBCR: 20.0000 meq | EXTENDED_RELEASE_TABLET | Freq: Every day | ORAL | 0 refills | Status: DC

## 2022-02-10 NOTE — Progress Notes (Signed)
[facility] [phone]   Fax:([phone]  PROGRESS NOTE  Patient Care Team: Exie Parody, MD as PCP - General (Family Medicine) Dellis Anes Hetty Ely, MD as Consulting Physician (Allergy and Immunology)  CHIEF COMPLAINTS/PURPOSE OF CONSULTATION:  Leukocytosis and anemia with B12 deficiency  HISTORY OF PRESENTING ILLNESS:  Betty Golden 76 y.o. female returns for a follow up for leukocytosis and anemia. She is accompanied by her daughter for this visit.  On exam today, Betty Golden reports she is overall doing well. She reports that she continues to have periodic flares up from her RA and fibromyalgia. She is currently receiving Faserna 30 mg subq every 8 weeks. She denies any recent use of steroids. She reports her appetite decreases when she has a flare up due to diffuse pain. She denies any nausea, vomiting or abdominal pain. Her bowel habits are unchanged without any recurrent episodes of diarrhea or constipation. She reports occasional episodes of hematochezia due to hemorrhoids. Otherwise no signs of bleeding.  She has occasional episodes of shortness of breath with exertion but improved with using her inhaler. She denies fevers, chills, night sweats, chest pain, cough, neuropathy or edema. She has no other complaints. Rest of the 10 point ROS is below.   MEDICAL HISTORY:  Past Medical History:  Diagnosis Date   Asthma    C. difficile colitis    Cervical stenosis of spine    Chronic headaches    Chronic neck pain    Coronary artery disease    Depression    Diverticulosis    Essential hypertension    Fibromyalgia    GERD (gastroesophageal reflux disease)    History of Salmonella gastroenteritis    HNP (herniated nucleus pulposus), lumbar    Hyperlipidemia    IBS (irritable bowel syndrome)    Lung nodule    rheumatoid arthritis     SURGICAL HISTORY: Past Surgical History:  Procedure Laterality Date   APPENDECTOMY  1965   BIOPSY   08/26/2018   Procedure: BIOPSY;  Surgeon: Malissa Hippo, MD;  [facility];  Service: Endoscopy;;  gastric   BREAST BIOPSY     BREAST REDUCTION SURGERY  1994   CHOLECYSTECTOMY  1975   COLONOSCOPY N/A 08/19/2013   Procedure: COLONOSCOPY;  Surgeon: Malissa Hippo, MD;  [facility];  Service: Endoscopy;  Laterality: N/A;  155-moved to 140 Ann to notify pt   COLONOSCOPY N/A 08/26/2018   Procedure: COLONOSCOPY;  Surgeon: Malissa Hippo, MD;  [facility];  Service: Endoscopy;  Laterality: N/A;   ESOPHAGOGASTRODUODENOSCOPY N/A 08/26/2018   Procedure: ESOPHAGOGASTRODUODENOSCOPY (EGD);  Surgeon: Malissa Hippo, MD;  [facility];  Service: Endoscopy;  Laterality: N/A;   fibromyalgia     LUMBAR LAMINECTOMY/DECOMPRESSION MICRODISCECTOMY Bilateral 09/20/2015   Procedure: MICRO LUMBAR DECOMPRESSION L5-S1 BILATERAL    (1 LEVEL);  Surgeon: [name], MD;  Location: [facility];  Service: Orthopedics;  Laterality: Bilateral;   rheumatoid arthritis     Sinus Surgery     TONSILLECTOMY  1968    SOCIAL HISTORY: Social History   Socioeconomic History   Marital status: Single    Spouse name: Not on file   Number of children: 1   Years of education: Not on file   Highest education level: GED or equivalent  Occupational History   Not on file  Tobacco Use   Smoking status: Former    Packs/day: 2.00    Years: 10.00  Total pack years: 20.00    Types: Cigarettes    Quit date: 09/11/1988    Years since quitting: 33.4   Smokeless tobacco: Never   Tobacco comments:    quit 1990  Vaping Use   Vaping Use: Never used  Substance and Sexual Activity   Alcohol use: No    Comment: stopped in 1990   Drug use: No    Comment: hx smoking marijuana and cocaine weekends. Stopped in the 1990s.   Sexual activity: Not on file  Other Topics Concern   Not on file  Social History Narrative   Lives at home with her daughter & daughter's family (they live with her)    Right handed   Drinks no caffeine   Social Determinants of Health   Financial Resource Strain: Low Risk  (04/04/2019)   Overall Financial Resource Strain (CARDIA)    Difficulty of Paying Living Expenses: Not hard at all  Food Insecurity: No Food Insecurity (04/04/2019)   Hunger Vital Sign    Worried About Running Out of Food in the Last Year: Never true    Betty Golden in the Last Year: Never true  Transportation Needs: No Transportation Needs (04/04/2019)   PRAPARE - Hydrologist (Medical): No    Lack of Transportation (Non-Medical): No  Physical Activity: Inactive (04/04/2019)   Exercise Vital Sign    Days of Exercise per Week: 0 days    Minutes of Exercise per Session: 0 min  Stress: No Stress Concern Present (04/04/2019)   [place]    Feeling of Stress : Only a little  Social Connections: Moderately Integrated (04/04/2019)   Social Connection and Isolation Panel [NHANES]    Frequency of Communication with Friends and Family: More than three times a week    Frequency of Social Gatherings with Friends and Family: Three times a week    Attends Religious Services: More than 4 times per year    Active Member of Clubs or Organizations: Yes    Attends Archivist Meetings: More than 4 times per year    Marital Status: Never married  Intimate Partner Violence: Not At Risk (04/04/2019)   Humiliation, Afraid, Rape, and Kick questionnaire    Fear of Current or Ex-Partner: No    Emotionally Abused: No    Physically Abused: No    Sexually Abused: No    FAMILY HISTORY: Family History  Problem Relation Age of Onset   Heart disease Mother    Hypertension Mother    Asthma Mother    Heart disease Father    Stroke Father    Hypertension Father    Stroke Sister    Asthma Sister    Hypertension Brother    Asthma Brother    Asthma Maternal Grandmother    Heart disease Maternal Grandmother     Diabetes Maternal Grandfather    Asthma Maternal Aunt    Colon cancer Neg Hx     ALLERGIES:  is allergic to hydromorphone and sulfa antibiotics.  MEDICATIONS:  Current Outpatient Medications  Medication Sig Dispense Refill   albuterol (PROAIR HFA) 108 (90 Base) MCG/ACT inhaler Inhale 2 puffs into the lungs every 4 (four) hours as needed for wheezing or shortness of breath. 18 g 3   amLODipine (NORVASC) 5 MG tablet Take 1 tablet (5 mg total) by mouth daily. 30 tablet 1   aspirin 81 MG chewable tablet Chew 1 tablet (81 mg  total) by mouth daily. 30 tablet 0   Benralizumab (FASENRA) 30 MG/ML SOSY Inject 1 mL (30 mg total) into the skin every 8 (eight) weeks. 1 mL 6   DULoxetine (CYMBALTA) 30 MG capsule Take 30 mg by mouth 2 (two) times daily.     fluticasone (FLONASE) 50 MCG/ACT nasal spray SPRAY 2 SPRAYS INTO EACH NOSTRIL EVERY DAY 48 mL 1   Fluticasone-Umeclidin-Vilant (TRELEGY ELLIPTA) 200-62.5-25 MCG/ACT AEPB Inhale 1 puff into the lungs in the morning. 60 each 5   folic acid (FOLVITE) 1 MG tablet Take 1 mg by mouth daily.     losartan-hydrochlorothiazide (HYZAAR) 100-12.5 MG tablet Take 1 tablet by mouth daily.     methotrexate (RHEUMATREX) 2.5 MG tablet      montelukast (SINGULAIR) 10 MG tablet Take 1 tablet (10 mg total) by mouth at bedtime. 30 tablet 5   pantoprazole (PROTONIX) 40 MG tablet Take 1 tablet (40 mg total) by mouth daily. 30 tablet 1   potassium chloride SA (KLOR-CON M) 20 MEQ tablet Take 1 tablet (20 mEq total) by mouth daily. 30 tablet 0   pregabalin (LYRICA) 50 MG capsule Take 50 mg by mouth 2 (two) times daily.     simvastatin (ZOCOR) 20 MG tablet Take 20 mg by mouth at bedtime.      vitamin B-12 (CYANOCOBALAMIN) 1000 MCG tablet Take 1 tablet (1,000 mcg total) by mouth daily. 30 tablet 3   Current Facility-Administered Medications  Medication Dose Route Frequency Provider Last Rate Last Admin   Benralizumab SOSY 30 mg  30 mg Subcutaneous Q8 Toney Reil, MD   30 mg at 02/06/22 1330    REVIEW OF SYSTEMS:   Constitutional: ( - ) fevers, ( - )  chills , ( - ) night sweats Eyes: ( - ) blurriness of vision, ( - ) double vision, ( - ) watery eyes Ears, nose, mouth, throat, and face: ( - ) mucositis, ( - ) sore throat Respiratory: ( - ) cough, ( - ) dyspnea, ( - ) wheezes Cardiovascular: ( - ) palpitation, ( - ) chest discomfort, ( - ) lower extremity swelling Gastrointestinal:  ( - ) nausea, ( - ) heartburn, ( -) change in bowel habits Skin: ( - ) abnormal skin rashes Lymphatics: ( - ) new lymphadenopathy, ( - ) easy bruising Neurological: ( - ) numbness, ( - ) tingling, ( - ) new weaknesses Behavioral/Psych: ( - ) mood change, ( - ) new changes  All other systems were reviewed with the patient and are negative.  PHYSICAL EXAMINATION: ECOG PERFORMANCE STATUS: 1 - Symptomatic but completely ambulatory  There were no vitals filed for this visit.  There were no vitals filed for this visit.   GENERAL: well appearing female in NAD  SKIN: skin color, texture, turgor are normal, no rashes or significant lesions. EYES: conjunctiva are pink and non-injected, sclera clear LUNGS: clear to auscultation and percussion with normal breathing effort HEART: regular rate & rhythm and no murmurs and no lower extremity edema Musculoskeletal: no cyanosis of digits and no clubbing  PSYCH: alert & oriented x 3, fluent speech NEURO: no focal motor/sensory deficits  LABORATORY DATA:  I have reviewed the data as listed    Latest Ref Rng & Units 02/08/2022    1:02 PM 10/16/2021    3:10 PM 09/29/2021    6:10 AM  CBC  WBC 4.0 - 10.5 K/uL 14.0  14.9  23.4   Hemoglobin 12.0 - 15.0 g/dL  11.8  9.9  9.9   Hematocrit 36.0 - 46.0 % 35.1  31.5  30.0   Platelets 150 - 400 K/uL 285  347  296        Latest Ref Rng & Units 02/08/2022    1:02 PM 10/16/2021    3:10 PM 09/30/2021    4:26 AM  CMP  Glucose 70 - 99 mg/dL 90  87  270   BUN 8 - 23 mg/dL 18  22  26     Creatinine 0.44 - 1.00 mg/dL  3.50  0.93   Sodium 135 - 145 mmol/L 140  141  141   Potassium 3.5 - 5.1 mmol/L 3.1  4.4  3.1   Chloride 98 - 111 mmol/L 101  101  109   CO2 22 - 32 mmol/L 34  34  25   Calcium 8.9 - 10.3 mg/dL 8.18  9.6  8.8   Total Protein 6.5 - 8.1 g/dL 7.4  7.0    Total Bilirubin 0.3 - 1.2 mg/dL 0.5  0.4    Alkaline Phos 38 - 126 U/L 68  61    AST 15 - 41 U/L 16  17    ALT 0 - 44 U/L 9  15       RADIOGRAPHIC STUDIES: I have personally reviewed the radiological images as listed and agreed with the findings in the report. No results found.  ASSESSMENT & PLAN AMALA PETION is a 76 y.o. female returns for a follow up for leukocytosis and normocytic anemia.   # Leukocytosis, neutrophil predominant: --Secondary to RA with periodic flare ups.  --Workup from 10/16/2021 showed elevation of inflammatory markers. There is no evidence of myeloproliferative disorders with normal MPN panel and  no evidence of BCR/ABL.  --Labs today show continued improved of leukocytosis from 14.9 to 14.0. ANC overall stable at 6.3. --No evidence of infection as patient is afebrile without any cough, urinary symptoms, etc.  --Monitor for now.  # Normocytic anemia/vitamin B12 deficiency: --Multifactorial with renal dysfunction, RA and use of methotrexate, vitamin B12 deficiency --Labs today show improvement of anemia with Hgb 11.8, MCV 85.0. Vitamin 378, MMA 243.  --Currently taking PO vitamin B12 replacement 1000 mcg daily, recommend to continue.   #Hypokalemia: --K is 3.1 today. Level has oscillated over the past few months.  --Likely secondary to HCTZ --Sent prescription of potassium chloride 20 mEq daily x 30 days --Patient will need to follow up with PCP to monitor and to determine if refills are needed.   # Bruising-resolved: --Patient has multiple site of bruising with palpable induration. --Likely secondary to subcutaneous heparin injections patient received during recent  hospitalization per record review --Monitor for now.    Follow up: --RTC in 3 months with repeat labs.    No orders of the defined types were placed in this encounter.   All questions were answered. The patient knows to call the clinic with any problems, questions or concerns.  I have spent a total of 30 minutes minutes of face-to-face and non-face-to-face time, preparing to see the patient, performing a medically appropriate examination, counseling and educating the patient, ordering tests/meds, documenting clinical information in the electronic health record, and care coordination.   10/18/2021, PA-C Department of Hematology/Oncology [facility] Phone: [phone]

## 2022-02-11 ENCOUNTER — Telehealth: Payer: Self-pay | Admitting: Hematology and Oncology

## 2022-02-11 ENCOUNTER — Telehealth: Payer: Self-pay

## 2022-02-11 NOTE — Telephone Encounter (Signed)
Office note and labs faxed to PCP Dr Allene Dillon.  [phone]  confirmation received

## 2022-02-11 NOTE — Telephone Encounter (Signed)
Scheduled appointment per 07/14 los. Patient aware.  

## 2022-02-11 NOTE — Telephone Encounter (Signed)
-----   Message from Briant Cedar, PA-C sent at 02/11/2022 11:23 AM EDT ----- Please notify patient that vitamin B12 levels have improved. Recommend to continue B12 pills and plan to return in 6 months to repeat labs.

## 2022-02-11 NOTE — Telephone Encounter (Signed)
Pt's daughter, Joni Reining, advised

## 2022-02-11 NOTE — Telephone Encounter (Signed)
Please notify PCP of potassium level and that we sent prescription for Kcl 20 mEq x 30 days. PCP needs to follow levels moving forward.   Pt's daughter, Joni Reining, advised and voiced understanding

## 2022-02-15 ENCOUNTER — Ambulatory Visit: Payer: Medicare Other | Admitting: Allergy & Immunology

## 2022-03-18 ENCOUNTER — Other Ambulatory Visit: Payer: Self-pay | Admitting: Physician Assistant

## 2022-03-22 ENCOUNTER — Ambulatory Visit: Payer: Medicare Other | Admitting: Allergy & Immunology

## 2022-04-03 ENCOUNTER — Ambulatory Visit: Payer: Medicare Other

## 2022-04-10 ENCOUNTER — Ambulatory Visit: Payer: Medicare Other | Admitting: Allergy & Immunology

## 2022-04-10 ENCOUNTER — Ambulatory Visit: Payer: Medicare Other

## 2022-04-10 ENCOUNTER — Encounter: Payer: Self-pay | Admitting: Allergy & Immunology

## 2022-04-10 VITALS — BP 132/70 | HR 86 | Temp 98.2°F | Resp 16 | Ht 64.0 in | Wt 145.8 lb

## 2022-04-10 DIAGNOSIS — J3089 Other allergic rhinitis: Secondary | ICD-10-CM

## 2022-04-10 DIAGNOSIS — B999 Unspecified infectious disease: Secondary | ICD-10-CM

## 2022-04-10 DIAGNOSIS — J4550 Severe persistent asthma, uncomplicated: Secondary | ICD-10-CM

## 2022-04-10 NOTE — Patient Instructions (Addendum)
1. Chronic nonseasonal allergic rhinitis - Continue with fluticasone two sprays per nostril once daily. - Increase too TWICE DAILY when you feel that your sinuses are acting up.  - Continue with cetirizine (Zyrtec) 10mg  daily.  - TRY [employer] for cheaper pricing for Flonase (fluticasone).  - TRY [employer] for cheaper pricing for Zyrtec (cetirizine), but LOOK for generic!    2. Moderate persistent asthma, uncomplicated - Lung function looks AWESOME today.  - We are not going to make any changes at this time.  - We can try sending a 90-day supply of the Trelegy.  - Use up the Singulair (montelukast) that you have on hand and then STOP this one.  - Daily controller medication(s):  Fasenra every 8 weeks  + Trelegy one puff once daily + Singulair 10mg  (complete what you have an stop it). - Prior to physical activity: albuterol 2 puffs 10-15 minutes before physical activity. - Rescue medications: albuterol 4 puffs every 4-6 hours as needed - Asthma control goals:  * Full participation in all desired activities (may need albuterol before activity) * Albuterol use two time or less a week on average (not counting use with activity) * Cough interfering with sleep two time or less a month * Oral steroids no more than once a year * No hospitalizations  3. Return in about 6 months (around 10/09/2022).    Please inform of any Emergency Department visits, hospitalizations, or changes in symptoms. Call 10/11/2022 before going to the ED for breathing or allergy symptoms since we might be able to fit you in for a sick visit. Feel free to contact us anytime with any questions, problems, or concerns.  It was a pleasure to see you and your family again today!  Websites that have reliable patient information: 1. American Academy of Asthma, Allergy, and Immunology: www.aaaai.org 2. Food Allergy Research and Education (FARE): foodallergy.org 3. Mothers of Asthmatics: http://www.asthmacommunitynetwork.org 4. American  College of Allergy, Asthma, and Immunology: www.acaai.org   COVID-19 Vaccine Information can be found at: [place] For questions related to vaccine distribution or appointments, please email [email or web address] or call [phone].   We realize that you might be concerned about having an allergic reaction to the COVID19 vaccines. To help with that concern, WE ARE OFFERING THE COVID19 VACCINES IN OUR OFFICE! Ask the front desk for dates!     "Like" [email or web address] on Facebook and Instagram for our latest updates!      A healthy democracy works best when [phone] participate! Make sure you are registered to vote! If you have moved or changed any of your contact information, you will need to get this updated before voting!  In some cases, you MAY be able to register to vote online: [place]

## 2022-04-10 NOTE — Progress Notes (Signed)
FOLLOW UP  Date of Service/Encounter:  04/10/22   Assessment:   Severe persistent asthma without complication   Chronic rhinitis (last testing performed in [place])   Recurrent infections - with normal workup in the past (May 2020)   Rheumatoid arthritis - recently transitioned to Enbrel   Fibromyalgia   History of stroke (September 2020)   Normocytic anemia  Plan/Recommendations:   1. Chronic nonseasonal allergic rhinitis - Continue with fluticasone two sprays per nostril once daily. - Increase too TWICE DAILY when you feel that your sinuses are acting up.  - Continue with cetirizine (Zyrtec) 10mg  daily.  - TRY [employer] for cheaper pricing for Flonase (fluticasone).  - TRY [employer] for cheaper pricing for Zyrtec (cetirizine), but LOOK for generic!    2. Moderate persistent asthma, uncomplicated - Lung function looks AWESOME today.  - We are not going to make any changes at this time.  - We can try sending a 90-day supply of the Trelegy.  - Use up the Singulair (montelukast) that you have on hand and then STOP this one.  - Daily controller medication(s):  Fasenra every 8 weeks  + Trelegy one puff once daily + Singulair 10mg  (complete what you have an stop it). - Prior to physical activity: albuterol 2 puffs 10-15 minutes before physical activity. - Rescue medications: albuterol 4 puffs every 4-6 hours as needed - Asthma control goals:  * Full participation in all desired activities (may need albuterol before activity) * Albuterol use two time or less a week on average (not counting use with activity) * Cough interfering with sleep two time or less a month * Oral steroids no more than once a year * No hospitalizations  3. Return in about 6 months (around 10/09/2022).    Subjective:   HORACE WISHON is a 76 y.o. female presenting today for follow up of  Chief Complaint  Patient presents with   Asthma    3 mth f/u - Better than it has been - still get short of  breath on exertion.   Perennial Allergic Rhinits    3 mth f/u - Okay   Sinusitis    Sinus press, sinus headaches x last week    [name] has a history of the following: Patient Active Problem List   Diagnosis Date Noted   Malnutrition of moderate degree 09/27/2021   Acute respiratory failure with hypoxia (HCC)    Acute urinary retention 09/26/2021   Aspiration pneumonia (HCC) 09/26/2021   RA (rheumatoid arthritis) (HCC) 09/25/2021   Leukocytosis 09/25/2021   Viral pneumonia 04/20/2019   TIA (transient ischemic attack) 04/16/2019   Dyspnea 04/04/2019   COVID-19 virus infection 04/04/2019   Gastritis and gastroduodenitis 07/08/2018   Rectal bleeding 06/29/2018   Fibromyalgia 09/08/2017   Spinal stenosis of lumbar region 09/20/2015   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/03/2015   Moderate persistent asthma 07/03/2015   Allergic rhinitis 07/03/2015   Colitis due to Clostridium difficile 02/10/2015   Abdominal pain 02/10/2015   Nausea and vomiting 02/10/2015   AKI (acute kidney injury) (HCC) 02/10/2015   Dehydration 02/10/2015   Hypoxia 02/10/2015   Hyponatremia 02/10/2015   Hypokalemia 02/10/2015   COPD (chronic obstructive pulmonary disease) (HCC)    Unspecified constipation 07/15/2013   DYSPNEA 04/05/2008   Essential hypertension 03/10/2008   Headache(784.0) 03/10/2008   Cough 03/10/2008    History obtained from: chart review and patient.  Kysa is a 76 y.o. female presenting for a follow up visit.  She was  last seen in April 2023.  At that time, we continue with Flonase 2 sprays per nostril daily, increasing to twice daily when she feels that her sinuses are acting up.  We continue with Zyrtec 10 mg daily.  For her asthma, we continued with Harrington Challenger every 8 weeks as well as Trelegy 1 puff once daily and Singulair 10 mg daily.  Asthma/Respiratory Symptom History: She is going to get her Harrington Challenger today. She remains on Trelegy one puff once daily. She is  wondering about changing some  medications. She is paying $30 per refill for her Singulair and Trelegy.   She is doing well with regards to her oxygenation. She no longer using nighttime oxygen. She has not had PNA since her hospitalization in February 2023. She had AKI at the time. She did see a Nephrologist and sees him again in 6 months.  She is no longer following up with pulmonology.  She was seeing Dr. Craige Cotta in the past.  Allergic Rhinitis Symptom History: Her allergic rhinitis is under good control with Zyrtec as well as Flonase.  She does this twice a day for the most part.  She denies any recent sinus infections.  She denies any postnasal drip.    She remains on Simponi for treatment of her rheumatoid arthritis.  She also has fibromyalgia as well.  Her daughter continues to live with her.  Evidently, her daughter's husband left her about 3 months ago. Estephania thinks that this is for the best.  Otherwise, there have been no changes to her past medical history, surgical history, family history, or social history.    Review of Systems  Constitutional: Negative.  Negative for chills, fever, malaise/fatigue and weight loss.  HENT:  Negative for congestion, ear discharge, ear pain and sinus pain.   Eyes:  Negative for pain, discharge and redness.  Respiratory:  Negative for cough, sputum production, shortness of breath and wheezing.   Cardiovascular: Negative.  Negative for chest pain and palpitations.  Gastrointestinal:  Negative for abdominal pain, constipation, diarrhea, heartburn, nausea and vomiting.  Skin: Negative.  Negative for itching and rash.  Neurological:  Negative for dizziness and headaches.  Endo/Heme/Allergies:  Negative for environmental allergies. Does not bruise/bleed easily.       Objective:   Blood pressure 132/70, pulse 86, temperature 98.2 F (36.8 C), resp. rate 16, height  (1.626 m), weight 145 lb 12.8 oz (66.1 kg), SpO2 98 %. Body mass index is 25.03 kg/m.    Physical  Exam Vitals reviewed.  Constitutional:      Appearance: She is well-developed.     Comments: Talkative.  She is not using a walker at all.  HENT:     Head: Normocephalic and atraumatic.     Right Ear: Tympanic membrane, ear canal and external ear normal.     Left Ear: Tympanic membrane, ear canal and external ear normal.     Nose: No nasal deformity, septal deviation, mucosal edema or rhinorrhea.     Right Turbinates: Enlarged, swollen and pale.     Left Turbinates: Enlarged, swollen and pale.     Right Sinus: No maxillary sinus tenderness or frontal sinus tenderness.     Left Sinus: No maxillary sinus tenderness or frontal sinus tenderness.     Comments: Dried rhinorrhea bilaterally.     Mouth/Throat:     Mouth: Mucous membranes are not pale and not dry.     Pharynx: Uvula midline.  Eyes:     General: Lids are  normal. No allergic shiner.       Right eye: No discharge.        Left eye: No discharge.     Conjunctiva/sclera: Conjunctivae normal.     Right eye: Right conjunctiva is not injected. No chemosis.    Left eye: Left conjunctiva is not injected. No chemosis.    Pupils: Pupils are equal, round, and reactive to light.  Cardiovascular:     Rate and Rhythm: Normal rate and regular rhythm.     Heart sounds: Normal heart sounds.  Pulmonary:     Effort: Pulmonary effort is normal. No tachypnea, accessory muscle usage or respiratory distress.     Breath sounds: Normal breath sounds. No wheezing, rhonchi or rales.     Comments: Moving air well in all lung fields.  No increased work of breathing. Chest:     Chest wall: No tenderness.  Musculoskeletal:     Right hand: Swelling and deformity present.     Left hand: Swelling and deformity present.  Lymphadenopathy:     Cervical: No cervical adenopathy.  Skin:    General: Skin is warm.     Capillary Refill: Capillary refill takes less than 2 seconds.     Coloration: Skin is not pale.     Findings: No abrasion, erythema, petechiae  or rash. Rash is not papular, urticarial or vesicular.     Comments: No eczematous or urticarial lesions noted.   Neurological:     Mental Status: She is alert.  Psychiatric:        Behavior: Behavior is cooperative.      Diagnostic studies:    Spirometry: results normal (FEV1: 1.55/87%, FVC: 2.24/97%, FEV1/FVC: 69%).    Spirometry consistent with normal pattern.   Allergy Studies: none       [name], MD  [facility]

## 2022-05-15 ENCOUNTER — Inpatient Hospital Stay: Payer: Medicare Other | Attending: Physician Assistant

## 2022-05-15 ENCOUNTER — Other Ambulatory Visit: Payer: Self-pay | Admitting: Hematology and Oncology

## 2022-05-15 ENCOUNTER — Inpatient Hospital Stay (HOSPITAL_BASED_OUTPATIENT_CLINIC_OR_DEPARTMENT_OTHER): Payer: Medicare Other | Admitting: Hematology and Oncology

## 2022-05-15 ENCOUNTER — Other Ambulatory Visit: Payer: Self-pay

## 2022-05-15 VITALS — BP 154/74 | HR 90 | Temp 97.8°F | Resp 15 | Wt 147.5 lb

## 2022-05-15 DIAGNOSIS — D72829 Elevated white blood cell count, unspecified: Secondary | ICD-10-CM | POA: Diagnosis present

## 2022-05-15 DIAGNOSIS — I2510 Atherosclerotic heart disease of native coronary artery without angina pectoris: Secondary | ICD-10-CM | POA: Diagnosis not present

## 2022-05-15 DIAGNOSIS — K219 Gastro-esophageal reflux disease without esophagitis: Secondary | ICD-10-CM | POA: Insufficient documentation

## 2022-05-15 DIAGNOSIS — D649 Anemia, unspecified: Secondary | ICD-10-CM | POA: Diagnosis not present

## 2022-05-15 DIAGNOSIS — M069 Rheumatoid arthritis, unspecified: Secondary | ICD-10-CM | POA: Insufficient documentation

## 2022-05-15 DIAGNOSIS — D72825 Bandemia: Secondary | ICD-10-CM

## 2022-05-15 DIAGNOSIS — Z79899 Other long term (current) drug therapy: Secondary | ICD-10-CM | POA: Insufficient documentation

## 2022-05-15 DIAGNOSIS — E538 Deficiency of other specified B group vitamins: Secondary | ICD-10-CM | POA: Diagnosis not present

## 2022-05-15 DIAGNOSIS — K589 Irritable bowel syndrome without diarrhea: Secondary | ICD-10-CM | POA: Diagnosis not present

## 2022-05-15 DIAGNOSIS — Z7969 Long term (current) use of other immunomodulators and immunosuppressants: Secondary | ICD-10-CM | POA: Diagnosis not present

## 2022-05-15 DIAGNOSIS — M797 Fibromyalgia: Secondary | ICD-10-CM | POA: Diagnosis not present

## 2022-05-15 DIAGNOSIS — Z87891 Personal history of nicotine dependence: Secondary | ICD-10-CM | POA: Insufficient documentation

## 2022-05-15 DIAGNOSIS — E876 Hypokalemia: Secondary | ICD-10-CM | POA: Diagnosis not present

## 2022-05-15 DIAGNOSIS — I10 Essential (primary) hypertension: Secondary | ICD-10-CM | POA: Insufficient documentation

## 2022-05-15 DIAGNOSIS — D72828 Other elevated white blood cell count: Secondary | ICD-10-CM

## 2022-05-15 DIAGNOSIS — E785 Hyperlipidemia, unspecified: Secondary | ICD-10-CM | POA: Insufficient documentation

## 2022-05-15 LAB — CMP (CANCER CENTER ONLY)
ALT: 15 U/L (ref 0–44)
AST: 22 U/L (ref 15–41)
Albumin: 4.1 g/dL (ref 3.5–5.0)
Alkaline Phosphatase: 61 U/L (ref 38–126)
Anion gap: 7 (ref 5–15)
BUN: 19 mg/dL (ref 8–23)
CO2: 30 mmol/L (ref 22–32)
Calcium: 9.9 mg/dL (ref 8.9–10.3)
Chloride: 105 mmol/L (ref 98–111)
Creatinine: 1.18 mg/dL — ABNORMAL HIGH (ref 0.44–1.00)
GFR, Estimated: 48 mL/min — ABNORMAL LOW (ref >60)
Glucose, Bld: 91 mg/dL (ref 70–99)
Potassium: 3.6 mmol/L (ref 3.5–5.1)
Sodium: 142 mmol/L (ref 135–145)
Total Bilirubin: 0.7 mg/dL (ref 0.3–1.2)
Total Protein: 8 g/dL (ref 6.5–8.1)

## 2022-05-15 LAB — CBC WITH DIFFERENTIAL (CANCER CENTER ONLY)
Abs Immature Granulocytes: 0.14 10*3/uL — ABNORMAL HIGH (ref 0.00–0.07)
Basophils Absolute: 0 10*3/uL (ref 0.0–0.1)
Basophils Relative: 0 %
Eosinophils Absolute: 0 10*3/uL (ref 0.0–0.5)
Eosinophils Relative: 0 %
HCT: 39.2 % (ref 36.0–46.0)
Hemoglobin: 12.9 g/dL (ref 12.0–15.0)
Immature Granulocytes: 1 %
Lymphocytes Relative: 37 %
Lymphs Abs: 5.1 10*3/uL — ABNORMAL HIGH (ref 0.7–4.0)
MCH: 28.7 pg (ref 26.0–34.0)
MCHC: 32.9 g/dL (ref 30.0–36.0)
MCV: 87.3 fL (ref 80.0–100.0)
Monocytes Absolute: 0.9 10*3/uL (ref 0.1–1.0)
Monocytes Relative: 7 %
Neutro Abs: 7.5 10*3/uL (ref 1.7–7.7)
Neutrophils Relative %: 55 %
Platelet Count: 289 10*3/uL (ref 150–400)
RBC: 4.49 MIL/uL (ref 3.87–5.11)
RDW: 13.6 % (ref 11.5–15.5)
WBC Count: 13.7 10*3/uL — ABNORMAL HIGH (ref 4.0–10.5)
nRBC: 0 % (ref 0.0–0.2)

## 2022-05-15 LAB — VITAMIN B12: Vitamin B-12: 775 pg/mL (ref 180–914)

## 2022-05-15 LAB — FOLATE: Folate: 14.2 ng/mL (ref >5.9)

## 2022-05-15 NOTE — Progress Notes (Signed)
[place] [phone]   [phone]  PROGRESS NOTE  Patient Care Team: Normand Sloop, MD as PCP - General (Family Medicine) Ernst Bowler Gwenith Daily, MD as Consulting Physician (Allergy and Immunology)  CHIEF COMPLAINTS/PURPOSE OF CONSULTATION:  Leukocytosis and anemia with B12 deficiency  HISTORY OF PRESENTING ILLNESS:  Betty Golden 76 y.o. female returns for a follow up for leukocytosis and anemia. She is unaccompanied today.   On exam today, Betty Golden reports she has been "okay" in the interim since her last visit.  She reports that she has "absolutely no energy".  She reports her energy is currently a 0 out of 10.  She reports that there are some days where she "can you make it out of it".  She notes that her fibromyalgia may be contributing to this and that the vitamin B12 shots she received radiating is not helping at all.  She does have some occasional bouts of shortness of breath which are well treated with her inhaler.  She reports that her muscles "stop working and hurt when she gets tired".  She is taking pregabalin which "helps somewhat".  She notes that she has not had a major fibromyalgia attack in quite some time.  She notes that her primary symptom is lack of energy.  She is not having any issues with bleeding.  On average she is eating okay and has gained 2 pounds in the last month.  She denies fevers, chills, night sweats, chest pain, cough, neuropathy or edema. She has no other complaints. Rest of the 10 point ROS is below.   MEDICAL HISTORY:  Past Medical History:  Diagnosis Date   Asthma    C. difficile colitis    Cervical stenosis of spine    Chronic headaches    Chronic neck pain    Coronary artery disease    Depression    Diverticulosis    Essential hypertension    Fibromyalgia    GERD (gastroesophageal reflux disease)    History of Salmonella gastroenteritis    HNP (herniated nucleus pulposus), lumbar     Hyperlipidemia    IBS (irritable bowel syndrome)    Lung nodule    rheumatoid arthritis     SURGICAL HISTORY: Past Surgical History:  Procedure Laterality Date   APPENDECTOMY  1965   BIOPSY  08/26/2018   Procedure: BIOPSY;  Surgeon: Rogene Houston, MD;  Location: [facility];  Service: Endoscopy;;  gastric   BREAST BIOPSY     BREAST [place]   COLONOSCOPY N/A 08/19/2013   Procedure: COLONOSCOPY;  Surgeon: Rogene Houston, MD;  Location: [facility];  Service: Endoscopy;  Laterality: N/A;  155-moved to 140 Ann to notify pt   COLONOSCOPY N/A 08/26/2018   Procedure: COLONOSCOPY;  Surgeon: Rogene Houston, MD;  Location: [facility];  Service: Endoscopy;  Laterality: N/A;   ESOPHAGOGASTRODUODENOSCOPY N/A 08/26/2018   Procedure: ESOPHAGOGASTRODUODENOSCOPY (EGD);  Surgeon: Rogene Houston, MD;  Location: [facility];  Service: Endoscopy;  Laterality: N/A;   fibromyalgia     LUMBAR LAMINECTOMY/DECOMPRESSION MICRODISCECTOMY Bilateral 09/20/2015   Procedure: MICRO LUMBAR DECOMPRESSION L5-S1 BILATERAL    (1 LEVEL);  Surgeon: Susa Day, MD;  Location: [facility];  Service: Orthopedics;  Laterality: Bilateral;   rheumatoid arthritis     Sinus Surgery     TONSILLECTOMY  1968    SOCIAL HISTORY: Social History   Socioeconomic History   Marital status: Single  Spouse name: Not on file   Number of children: 1   Years of education: Not on file   Highest education level: GED or equivalent  Occupational History   Not on file  Tobacco Use   Smoking status: Former    Packs/day: 2.00    Years: 10.00    Total pack years: 20.00    Types: Cigarettes    Quit date: 09/11/1988    Years since quitting: 33.6   Smokeless tobacco: Never   Tobacco comments:    quit 1990  Vaping Use   Vaping Use: Never used  Substance and Sexual Activity   Alcohol use: No    Comment: stopped in 1990   Drug use: No    Comment: hx smoking marijuana and cocaine  weekends. Stopped in the 1990s.   Sexual activity: Not on file  Other Topics Concern   Not on file  Social History Narrative   Lives at home with her daughter & daughter's family (they live with her)   Right handed   Drinks no caffeine   Social Determinants of Health   Financial Resource Strain: Low Risk  (04/04/2019)   Overall Financial Resource Strain (CARDIA)    Difficulty of Paying Living Expenses: Not hard at all  Food Insecurity: No Food Insecurity (04/04/2019)   Hunger Vital Sign    Worried About Running Out of Food in the Last Year: Never true    [place] in the Last Year: Never true  Transportation Needs: No Transportation Needs (04/04/2019)   PRAPARE - Hydrologist (Medical): No    Lack of Transportation (Non-Medical): No  Physical Activity: Inactive (04/04/2019)   Exercise Vital Sign    Days of Exercise per Week: 0 days    Minutes of Exercise per Session: 0 min  Stress: No Stress Concern Present (04/04/2019)   [place]    Feeling of Stress : Only a little  Social Connections: Moderately Integrated (04/04/2019)   Social Connection and Isolation Panel [NHANES]    Frequency of Communication with Friends and Family: More than three times a week    Frequency of Social Gatherings with Friends and Family: Three times a week    Attends Religious Services: More than 4 times per year    Active Member of Clubs or Organizations: Yes    Attends Archivist Meetings: More than 4 times per year    Marital Status: Never married  Intimate Partner Violence: Not At Risk (04/04/2019)   Humiliation, Afraid, Rape, and Kick questionnaire    Fear of Current or Ex-Partner: No    Emotionally Abused: No    Physically Abused: No    Sexually Abused: No    FAMILY HISTORY: Family History  Problem Relation Age of Onset   Heart disease Mother    Hypertension Mother    Asthma Mother    Heart  disease Father    Stroke Father    Hypertension Father    Stroke Sister    Asthma Sister    Hypertension Brother    Asthma Brother    Asthma Maternal Grandmother    Heart disease Maternal Grandmother    Diabetes Maternal Grandfather    Asthma Maternal Aunt    Colon cancer Neg Hx     ALLERGIES:  is allergic to hydromorphone and sulfa antibiotics.  MEDICATIONS:  Current Outpatient Medications  Medication Sig Dispense Refill   albuterol (PROAIR HFA) 108 (  90 Base) MCG/ACT inhaler Inhale 2 puffs into the lungs every 4 (four) hours as needed for wheezing or shortness of breath. 18 g 3   amLODipine (NORVASC) 5 MG tablet Take 1 tablet (5 mg total) by mouth daily. 30 tablet 1   Benralizumab (FASENRA) 30 MG/ML SOSY Inject 1 mL (30 mg total) into the skin every 8 (eight) weeks. 1 mL 6   DULoxetine (CYMBALTA) 30 MG capsule Take 30 mg by mouth 2 (two) times daily.     fluticasone (FLONASE) 50 MCG/ACT nasal spray SPRAY 2 SPRAYS INTO EACH NOSTRIL EVERY DAY 48 mL 1   Fluticasone-Umeclidin-Vilant (TRELEGY ELLIPTA) 200-62.5-25 MCG/ACT AEPB Inhale 1 puff into the lungs in the morning. 60 each 5   leflunomide (ARAVA) 10 MG tablet Take 10 mg by mouth daily.     losartan-hydrochlorothiazide (HYZAAR) 100-12.5 MG tablet Take 1 tablet by mouth daily.     montelukast (SINGULAIR) 10 MG tablet Take 1 tablet (10 mg total) by mouth at bedtime. 30 tablet 5   pantoprazole (PROTONIX) 40 MG tablet Take 1 tablet (40 mg total) by mouth daily. 30 tablet 1   pregabalin (LYRICA) 50 MG capsule Take 50 mg by mouth daily.     simvastatin (ZOCOR) 20 MG tablet Take 20 mg by mouth at bedtime.      vitamin B-12 (CYANOCOBALAMIN) 1000 MCG tablet Take 1 tablet (1,000 mcg total) by mouth daily. 30 tablet 3   Current Facility-Administered Medications  Medication Dose Route Frequency Provider Last Rate Last Admin   Benralizumab SOSY 30 mg  30 mg Subcutaneous Q8 Toney Reil, MD   30 mg at 04/10/22 1059    REVIEW OF  SYSTEMS:   Constitutional: ( - ) fevers, ( - )  chills , ( - ) night sweats Eyes: ( - ) blurriness of vision, ( - ) double vision, ( - ) watery eyes Ears, nose, mouth, throat, and face: ( - ) mucositis, ( - ) sore throat Respiratory: ( - ) cough, ( - ) dyspnea, ( - ) wheezes Cardiovascular: ( - ) palpitation, ( - ) chest discomfort, ( - ) lower extremity swelling Gastrointestinal:  ( - ) nausea, ( - ) heartburn, ( -) change in bowel habits Skin: ( - ) abnormal skin rashes Lymphatics: ( - ) new lymphadenopathy, ( - ) easy bruising Neurological: ( - ) numbness, ( - ) tingling, ( - ) new weaknesses Behavioral/Psych: ( - ) mood change, ( - ) new changes  All other systems were reviewed with the patient and are negative.  PHYSICAL EXAMINATION: ECOG PERFORMANCE STATUS: 1 - Symptomatic but completely ambulatory  Vitals:   05/15/22 1532  BP: (!) 154/74  Pulse: 90  Resp: 15  Temp: 97.8 F (36.6 C)  SpO2: 98%    Filed Weights   05/15/22 1532  Weight: 147 lb 8 oz (66.9 kg)     GENERAL: well appearing female in NAD  SKIN: skin color, texture, turgor are normal, no rashes or significant lesions. EYES: conjunctiva are pink and non-injected, sclera clear LUNGS: clear to auscultation and percussion with normal breathing effort HEART: regular rate & rhythm and no murmurs and no lower extremity edema Musculoskeletal: no cyanosis of digits and no clubbing  PSYCH: alert & oriented x 3, fluent speech NEURO: no focal motor/sensory deficits  LABORATORY DATA:  I have reviewed the data as listed    Latest Ref Rng & Units 05/15/2022    2:42 PM 02/08/2022    1:02  PM 10/16/2021    3:10 PM  CBC  WBC 4.0 - 10.5 K/uL 13.7  14.0  14.9   Hemoglobin 12.0 - 15.0 g/dL 12.9  11.8  9.9   Hematocrit 36.0 - 46.0 % 39.2  35.1  31.5   Platelets 150 - 400 K/uL 289  285  347        Latest Ref Rng & Units 05/15/2022    2:42 PM 02/08/2022    1:02 PM 10/16/2021    3:10 PM  CMP  Glucose 70 - 99 mg/dL 91  90   87   BUN 8 - 23 mg/dL 19  18  22    Creatinine 0.44 - 1.00 mg/dL 1.18  1.23  1.43   Sodium 135 - 145 mmol/L 142  140  141   Potassium 3.5 - 5.1 mmol/L 3.6  3.1  4.4   Chloride 98 - 111 mmol/L 105  101  101   CO2 22 - 32 mmol/L 30  34  34   Calcium 8.9 - 10.3 mg/dL 9.9  10.0  9.6   Total Protein 6.5 - 8.1 g/dL 8.0  7.4  7.0   Total Bilirubin 0.3 - 1.2 mg/dL 0.7  0.5  0.4   Alkaline Phos 38 - 126 U/L 61  68  61   AST 15 - 41 U/L 22  16  17    ALT 0 - 44 U/L 15  9  15       RADIOGRAPHIC STUDIES: I have personally reviewed the radiological images as listed and agreed with the findings in the report. No results found.  ASSESSMENT & PLAN Betty Golden is a 76 y.o. female returns for a follow up for leukocytosis and normocytic anemia.   # Leukocytosis, neutrophil predominant: --Secondary to RA with periodic flare ups.  --Workup from 10/16/2021 showed elevation of inflammatory markers. There is no evidence of myeloproliferative disorders with normal MPN panel and  no evidence of BCR/ABL.  --Labs today show  leukocytosis with WBC 13.7, moderately increased lymphocytes. Will order flow cytometry for next visit.  --No evidence of infection as patient is afebrile without any cough, urinary symptoms, etc.  --Monitor for now.  # Normocytic anemia/vitamin B12 deficiency: --Multifactorial with renal dysfunction, RA and use of methotrexate, vitamin B12 deficiency --Labs today show improvement of anemia with Hgb 12.9 with MCV 87.3 --Currently taking PO vitamin B12 replacement 1000 mcg daily, recommend to continue.   #Hypokalemia: --K is 3.6 today. Level has oscillated over the past few months.  --Likely secondary to HCTZ --Sent prescription of potassium chloride 20 mEq daily x 30 days --Patient will need to follow up with PCP to monitor and to determine if refills are needed.   # Bruising-resolved: --Patient has multiple site of bruising with palpable induration. --Likely secondary to  subcutaneous heparin injections patient received during recent hospitalization per record review --Monitor for now.    Follow up: --RTC in 6 months with repeat labs.    No orders of the defined types were placed in this encounter.   All questions were answered. The patient knows to call the clinic with any problems, questions or concerns.  I have spent a total of 30 minutes minutes of face-to-face and non-face-to-face time, preparing to see the patient, performing a medically appropriate examination, counseling and educating the patient, ordering tests/meds, documenting clinical information in the electronic health record, and care coordination.   Ledell Peoples, MD Department of Hematology/Oncology [place] [facility] Phone:  [phone] Pager: [phone] Email: [email or web address]

## 2022-05-17 LAB — METHYLMALONIC ACID, SERUM: Methylmalonic Acid, Quantitative: 172 nmol/L (ref 0–378)

## 2022-06-05 ENCOUNTER — Ambulatory Visit: Payer: Medicare Other

## 2022-06-12 ENCOUNTER — Ambulatory Visit (INDEPENDENT_AMBULATORY_CARE_PROVIDER_SITE_OTHER): Payer: Medicare Other

## 2022-06-12 DIAGNOSIS — J4550 Severe persistent asthma, uncomplicated: Secondary | ICD-10-CM

## 2022-08-05 ENCOUNTER — Ambulatory Visit: Payer: Medicare Other

## 2022-08-05 ENCOUNTER — Telehealth: Payer: Self-pay | Admitting: Allergy & Immunology

## 2022-08-05 NOTE — Telephone Encounter (Signed)
Betty Golden called in and would like a refill of Trelegy sent to [facility] on [address].

## 2022-08-06 MED ORDER — TRELEGY ELLIPTA 200-62.5-25 MCG/ACT IN AEPB: 1.0000 | INHALATION_SPRAY | Freq: Every morning | RESPIRATORY_TRACT | 5 refills | Status: DC

## 2022-08-06 NOTE — Telephone Encounter (Signed)
I called patient and informed that I have sent in refills for trelegy to the [facility] in [place] on [address]

## 2022-08-07 ENCOUNTER — Ambulatory Visit: Payer: Medicare Other

## 2022-08-09 ENCOUNTER — Ambulatory Visit (INDEPENDENT_AMBULATORY_CARE_PROVIDER_SITE_OTHER): Payer: Medicare Other

## 2022-08-09 DIAGNOSIS — J4550 Severe persistent asthma, uncomplicated: Secondary | ICD-10-CM | POA: Diagnosis not present

## 2022-09-24 ENCOUNTER — Other Ambulatory Visit: Payer: Self-pay | Admitting: *Deleted

## 2022-09-24 MED ORDER — FASENRA 30 MG/ML ~~LOC~~ SOSY: 30.0000 mg | PREFILLED_SYRINGE | SUBCUTANEOUS | 6 refills | Status: DC

## 2022-10-07 ENCOUNTER — Ambulatory Visit: Payer: Medicare Other

## 2022-10-09 ENCOUNTER — Other Ambulatory Visit: Payer: Self-pay

## 2022-10-09 ENCOUNTER — Ambulatory Visit: Payer: Medicare Other

## 2022-10-09 ENCOUNTER — Encounter: Payer: Self-pay | Admitting: Allergy & Immunology

## 2022-10-09 ENCOUNTER — Ambulatory Visit (INDEPENDENT_AMBULATORY_CARE_PROVIDER_SITE_OTHER): Payer: Medicare Other | Admitting: Allergy & Immunology

## 2022-10-09 VITALS — BP 126/80 | HR 118 | Temp 97.8°F | Resp 16 | Ht 62.6 in | Wt 151.5 lb

## 2022-10-09 DIAGNOSIS — J4550 Severe persistent asthma, uncomplicated: Secondary | ICD-10-CM

## 2022-10-09 DIAGNOSIS — B999 Unspecified infectious disease: Secondary | ICD-10-CM | POA: Diagnosis not present

## 2022-10-09 DIAGNOSIS — J3089 Other allergic rhinitis: Secondary | ICD-10-CM | POA: Diagnosis not present

## 2022-10-09 MED ORDER — PANTOPRAZOLE SODIUM 40 MG PO TBEC: 40.0000 mg | DELAYED_RELEASE_TABLET | Freq: Every day | ORAL | 1 refills | Status: DC

## 2022-10-09 MED ORDER — AMOXICILLIN 875 MG PO TABS: 875.0000 mg | ORAL_TABLET | Freq: Two times a day (BID) | ORAL | 0 refills | Status: AC

## 2022-10-09 MED ORDER — TRELEGY ELLIPTA 200-62.5-25 MCG/ACT IN AEPB: 1.0000 | INHALATION_SPRAY | Freq: Every morning | RESPIRATORY_TRACT | 3 refills | Status: DC

## 2022-10-09 MED ORDER — PREDNISONE 10 MG PO TABS: ORAL_TABLET | ORAL | 0 refills | Status: DC

## 2022-10-09 NOTE — Patient Instructions (Addendum)
1. Chronic nonseasonal allergic rhinitis - Continue with fluticasone two sprays per nostril once daily. - Increase too TWICE DAILY when you feel that your sinuses are acting up.  - Continue with cetirizine (Zyrtec) '10mg'$  daily.  - Add on amoxicillin twice daily for ten days and the prednisone taper as prescribed.    2. Moderate persistent asthma, uncomplicated - Lung function looks AWESOME today.  - We are not going to make any changes at this time.  - We can try sending a 90-day supply of the Trelegy.  - Let's the Singulair and see how things go without it.  - Daily controller medication(s):  Fasenra every 8 weeks  + Trelegy one puff once daily + Singulair '10mg'$  (complete what you have an stop it). - Prior to physical activity: albuterol 2 puffs 10-15 minutes before physical activity. - Rescue medications: albuterol 4 puffs every 4-6 hours as needed - Asthma control goals:  * Full participation in all desired activities (may need albuterol before activity) * Albuterol use two time or less a week on average (not counting use with activity) * Cough interfering with sleep two time or less a month * Oral steroids no more than once a year * No hospitalizations  3. Return in about 6 months (around 04/11/2023).    Please inform us of any Emergency Department visits, hospitalizations, or changes in symptoms. Call us before going to the ED for breathing or allergy symptoms since we might be able to fit you in for a sick visit. Feel free to contact us anytime with any questions, problems, or concerns.  It was a pleasure to see you and your family again today!  Websites that have reliable patient information: 1. American Academy of Asthma, Allergy, and Immunology: www.aaaai.org 2. Food Allergy Research and Education (FARE): foodallergy.org 3. Mothers of Asthmatics: http://www.asthmacommunitynetwork.org 4. American College of Allergy, Asthma, and Immunology: www.acaai.org   COVID-19 Vaccine  Information can be found at: ShippingScam.co.uk For questions related to vaccine distribution or appointments, please email [email or web address] or call [phone].   We realize that you might be concerned about having an allergic reaction to the COVID19 vaccines. To help with that concern, WE ARE OFFERING THE COVID19 VACCINES IN OUR OFFICE! Ask the front desk for dates!     "Like" [place] on Facebook and Instagram for our latest updates!      A healthy democracy works best when [employer] participate! Make sure you are registered to vote! If you have moved or changed any of your contact information, you will need to get this updated before voting!  In some cases, you MAY be able to register to vote online: [email or web address]

## 2022-10-09 NOTE — Progress Notes (Signed)
FOLLOW UP  Date of Service/Encounter:  10/09/22   Assessment:   Severe persistent asthma without complication   Chronic rhinitis (last testing performed in [place])   Recurrent infections - with normal workup in the past (May 2020)   Rheumatoid arthritis - recently transitioned to Enbrel   Fibromyalgia   History of stroke (September 2020)   Normocytic anemia  Plan/Recommendations:   1. Chronic nonseasonal allergic rhinitis - Continue with fluticasone two sprays per nostril once daily. - Increase too TWICE DAILY when you feel that your sinuses are acting up.  - Continue with cetirizine (Zyrtec) 10mg  daily.  - Add on amoxicillin twice daily for ten days and the prednisone taper as prescribed.    2. Moderate persistent asthma, uncomplicated - Lung function looks AWESOME today.  - We are not going to make any changes at this time.  - We can try sending a 90-day supply of the Trelegy.  - Let's the Singulair and see how things go without it.  - Daily controller medication(s):  Fasenra every 8 weeks  + Trelegy one puff once daily + Singulair 10mg  (complete what you have an stop it). - Prior to physical activity: albuterol 2 puffs 10-15 minutes before physical activity. - Rescue medications: albuterol 4 puffs every 4-6 hours as needed - Asthma control goals:  * Full participation in all desired activities (may need albuterol before activity) * Albuterol use two time or less a week on average (not counting use with activity) * Cough interfering with sleep two time or less a month * Oral steroids no more than once a year * No hospitalizations  3. Return in about 6 months (around 04/11/2023).    Subjective:   Betty Golden is a 77 y.o. female presenting today for follow up of  Chief Complaint  Patient presents with   Follow-up    Unsure if it's her sinuses or allergies but they are starting to bother her. Runny nose, congestion    Betty Golden has a  history of the following: Patient Active Problem List   Diagnosis Date Noted   Malnutrition of moderate degree 09/27/2021   Acute respiratory failure with hypoxia (HCC)    Acute urinary retention 09/26/2021   Aspiration pneumonia ([place]) 09/26/2021   RA (rheumatoid arthritis) ([place]) 09/25/2021   Leukocytosis 09/25/2021   Viral pneumonia 04/20/2019   TIA (transient ischemic attack) 04/16/2019   Dyspnea 04/04/2019   COVID-19 virus infection 04/04/2019   Gastritis and gastroduodenitis 07/08/2018   Rectal bleeding 06/29/2018   Fibromyalgia 09/08/2017   Spinal stenosis of lumbar region 09/20/2015   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/03/2015   Moderate persistent asthma 07/03/2015   Allergic rhinitis 07/03/2015   Colitis due to Clostridium difficile 02/10/2015   Abdominal pain 02/10/2015   Nausea and vomiting 02/10/2015   AKI (acute kidney injury) ([place]) 02/10/2015   Dehydration 02/10/2015   Hypoxia 02/10/2015   Hyponatremia 02/10/2015   Hypokalemia 02/10/2015   COPD (chronic obstructive pulmonary disease) ([place])    Unspecified constipation 07/15/2013   DYSPNEA 04/05/2008   Essential hypertension 03/10/2008   Headache(784.0) 03/10/2008   Cough 03/10/2008    History obtained from: chart review and patient.  Betty Golden is a 77 y.o. female presenting for a follow up visit.  She was last seen in September 2023.  At that time, we continued 2 sprays per nostril daily, increasing to 2 sprays twice daily.  Sinus flares.  We continued cetirizine 10 daily.  For her asthma, her emergency great.  We sent in a 90-day supply of Trelegy and continued with the Singulair that she had on hand, stopping when she was finished.  Since the last visit, she has done fairly well. She moved into a larger home with her daughter. It seems that they are all doing well, although her daughter tends to choose questionable significant others from what it sounds like.   Asthma/Respiratory Symptom History: She remains on the  Trelegy one  puff once daily which is working very well to control her symptoms. She has not been on any prednisone since we saw her last time. Camey's asthma has been well controlled. She has not required rescue medication, experienced nocturnal awakenings due to lower respiratory symptoms, nor have activities of daily living been limited. She has required no Emergency Department or Urgent Care visits for her asthma. She has required zero courses of systemic steroids for asthma exacerbations since the last visit. ACT score today is 25, indicating excellent asthma symptom control.   Allergic Rhinitis Symptom History: She reports that she has had one week so sinus pressure both sides. She does do saltwater rinses and chunks come out. She did not increase her Flonase to twice daily.  She is getting cetirizine on [place] and the cetirizine on [employer]. This is much cheaper.   GERD Symptom History: She is no longer taking her Protonix. She feels that her symptoms are under good control.   Her daughter has a migraine right now. That is why she is not here with [place] today.   Otherwise, there have been no changes to her past medical history, surgical history, family history, or social history.    Review of Systems  Constitutional: Negative.  Negative for chills, fever, malaise/fatigue and weight loss.  HENT: Negative.  Negative for congestion, ear discharge, ear pain and sinus pain.   Eyes:  Negative for pain, discharge and redness.  Respiratory:  Negative for cough, sputum production, shortness of breath, wheezing and stridor.   Cardiovascular: Negative.  Negative for chest pain and palpitations.  Gastrointestinal:  Negative for abdominal pain, constipation, diarrhea, heartburn, nausea and vomiting.  Skin: Negative.  Negative for itching and rash.  Neurological:  Negative for dizziness and headaches.  Endo/Heme/Allergies:  Positive for environmental allergies. Does not bruise/bleed easily.       Objective:    Blood pressure 126/80, pulse (!) 118, temperature 97.8 F (36.6 C), resp. rate 16, height 5' 2.6" (1.59 m), weight 151 lb 8 oz (68.7 kg), SpO2 95 %. Body mass index is 27.18 kg/m.    Physical Exam Vitals reviewed.  Constitutional:      Appearance: She is well-developed and normal weight.     Comments: Talkative.  She is not using a walker at all.  HENT:     Head: Normocephalic and atraumatic.     Right Ear: Tympanic membrane, ear canal and external ear normal. No drainage, swelling or tenderness. Tympanic membrane is not injected, scarred, erythematous, retracted or bulging.     Left Ear: Tympanic membrane, ear canal and external ear normal. No drainage, swelling or tenderness. Tympanic membrane is not injected, scarred, erythematous, retracted or bulging.     Nose: No nasal deformity, septal deviation, mucosal edema or rhinorrhea.     Right Turbinates: Enlarged, swollen and pale.     Left Turbinates: Enlarged, swollen and pale.     Right Sinus: No maxillary sinus tenderness or frontal sinus tenderness.     Left Sinus: No maxillary sinus tenderness or frontal sinus tenderness.  Comments: Dried rhinorrhea bilaterally.     Mouth/Throat:     Mouth: Mucous membranes are not pale and not dry.     Pharynx: Uvula midline.  Eyes:     General: Lids are normal. No allergic shiner.       Right eye: No discharge.        Left eye: No discharge.     Conjunctiva/sclera: Conjunctivae normal.     Right eye: Right conjunctiva is not injected. No chemosis.    Left eye: Left conjunctiva is not injected. No chemosis.    Pupils: Pupils are equal, round, and reactive to light.  Cardiovascular:     Rate and Rhythm: Normal rate and regular rhythm.     Heart sounds: Normal heart sounds.  Pulmonary:     Effort: Pulmonary effort is normal. No tachypnea, accessory muscle usage or respiratory distress.     Breath sounds: Normal breath sounds. No wheezing, rhonchi or rales.     Comments: Moving air  well in all lung fields.  No increased work of breathing. Chest:     Chest wall: No tenderness.  Abdominal:     Tenderness: There is no abdominal tenderness. There is no guarding or rebound.  Musculoskeletal:     Right hand: Swelling and deformity present.     Left hand: Swelling and deformity present.  Lymphadenopathy:     Head:     Right side of head: No submandibular, tonsillar or occipital adenopathy.     Left side of head: No submandibular, tonsillar or occipital adenopathy.     Cervical: No cervical adenopathy.  Skin:    General: Skin is warm.     Capillary Refill: Capillary refill takes less than 2 seconds.     Coloration: Skin is not pale.     Findings: No abrasion, erythema, petechiae or rash. Rash is not papular, urticarial or vesicular.     Comments: No eczematous or urticarial lesions noted.   Neurological:     Mental Status: She is alert.  Psychiatric:        Behavior: Behavior is cooperative.      Diagnostic studies:    Spirometry: results normal (FEV1: 1.56/94%, FVC: 2.06/96%, FEV1/FVC: 76%).    Spirometry consistent with normal pattern.   Allergy Studies: none        Salvatore Marvel, MD  [facility]

## 2022-10-11 ENCOUNTER — Encounter: Payer: Self-pay | Admitting: Allergy & Immunology

## 2022-11-13 ENCOUNTER — Other Ambulatory Visit: Payer: Self-pay | Admitting: Hematology and Oncology

## 2022-11-13 DIAGNOSIS — D72825 Bandemia: Secondary | ICD-10-CM

## 2022-11-13 NOTE — Progress Notes (Unsigned)
[facility] [phone]   [phone]  PROGRESS NOTE  Patient Care Team: Exie Parody, MD as PCP - General (Family Medicine) Dellis Anes Hetty Ely, MD as Consulting Physician (Allergy and Immunology)  CHIEF COMPLAINTS/PURPOSE OF CONSULTATION:  Leukocytosis and anemia with B12 deficiency  HISTORY OF PRESENTING ILLNESS:  KADAISHA MCNUTT 77 y.o. female returns for a follow up for leukocytosis and anemia. She is unaccompanied today.   On exam today, Ms. Lemelle reports today that she is struggling with her fibromyalgia and arthritis.  She reports that she has no energy and that she has to take a "day-to-day".  Likely her daughter lives with her and helps around the house.  She reports she has a lesion on her left arm and she has several of these "spots" around the body.  She is an appointment with dermatology later this week to reevaluate this.  She notes that she does have some bleeding at those sites on occasion but no other nosebleeds, gum bleeding, or blood in the urine or stool.  She reports that she is taking her vitamin B12 and iron pills without difficulty.  She denies any constipation or stomach upset.  Her appetite has been so-so and she feels like it fluctuates with her arthritis flares.  Otherwise she is not having any lightheadedness, dizziness, or shortness of breath.  She denies fevers, chills, night sweats, chest pain, cough, neuropathy or edema. She has no other complaints. Rest of the 10 point ROS is below.   MEDICAL HISTORY:  Past Medical History:  Diagnosis Date   Asthma    C. difficile colitis    Cervical stenosis of spine    Chronic headaches    Chronic neck pain    Coronary artery disease    Depression    Diverticulosis    Essential hypertension    Fibromyalgia    GERD (gastroesophageal reflux disease)    History of Salmonella gastroenteritis    HNP (herniated nucleus pulposus), lumbar    Hyperlipidemia    IBS (irritable  bowel syndrome)    Lung nodule    rheumatoid arthritis     SURGICAL HISTORY: Past Surgical History:  Procedure Laterality Date   APPENDECTOMY  1965   BIOPSY  08/26/2018   Procedure: BIOPSY;  Surgeon: Malissa Hippo, MD;  Location: [facility];  Service: Endoscopy;;  gastric   BREAST BIOPSY     BREAST REDUCTION SURGERY  1994   CHOLECYSTECTOMY  1975   COLONOSCOPY N/A 08/19/2013   Procedure: COLONOSCOPY;  Surgeon: Malissa Hippo, MD;  Location: [facility];  Service: Endoscopy;  Laterality: N/A;  155-moved to 140 Ann to notify pt   COLONOSCOPY N/A 08/26/2018   Procedure: COLONOSCOPY;  Surgeon: Malissa Hippo, MD;  Location: [facility];  Service: Endoscopy;  Laterality: N/A;   ESOPHAGOGASTRODUODENOSCOPY N/A 08/26/2018   Procedure: ESOPHAGOGASTRODUODENOSCOPY (EGD);  Surgeon: Malissa Hippo, MD;  Location: [facility];  Service: Endoscopy;  Laterality: N/A;   fibromyalgia     LUMBAR LAMINECTOMY/DECOMPRESSION MICRODISCECTOMY Bilateral 09/20/2015   Procedure: MICRO LUMBAR DECOMPRESSION L5-S1 BILATERAL    (1 LEVEL);  Surgeon: [name], MD;  Location: [facility];  Service: Orthopedics;  Laterality: Bilateral;   rheumatoid arthritis     Sinus Surgery     TONSILLECTOMY  1968    SOCIAL HISTORY: Social History   Socioeconomic History   Marital status: Single    Spouse name: Not on file   Number of children: 1  Years of education: Not on file   Highest education level: GED or equivalent  Occupational History   Not on file  Tobacco Use   Smoking status: Former    Packs/day: 2.00    Years: 10.00    Additional pack years: 0.00    Total pack years: 20.00    Types: Cigarettes    Quit date: 09/11/1988    Years since quitting: 34.1   Smokeless tobacco: Never   Tobacco comments:    quit 1990  Vaping Use   Vaping Use: Never used  Substance and Sexual Activity   Alcohol use: No    Comment: stopped in 1990   Drug use: No    Comment: hx smoking marijuana and cocaine  weekends. Stopped in the 1990s.   Sexual activity: Not on file  Other Topics Concern   Not on file  Social History Narrative   Lives at home with her daughter & daughter's family (they live with her)   Right handed   Drinks no caffeine   Social Determinants of Health   Financial Resource Strain: Low Risk  (04/04/2019)   Overall Financial Resource Strain (CARDIA)    Difficulty of Paying Living Expenses: Not hard at all  Food Insecurity: No Food Insecurity (04/04/2019)   Hunger Vital Sign    Worried About Running Out of Food in the Last Year: Never true    Ran Out of Food in the Last Year: Never true  Transportation Needs: No Transportation Needs (04/04/2019)   PRAPARE - Administrator, Civil Service (Medical): No    Lack of Transportation (Non-Medical): No  Physical Activity: Inactive (04/04/2019)   Exercise Vital Sign    Days of Exercise per Week: 0 days    Minutes of Exercise per Session: 0 min  Stress: No Stress Concern Present (04/04/2019)   [employer] of Occupational Health - Occupational Stress Questionnaire    Feeling of Stress : Only a little  Social Connections: Moderately Integrated (04/04/2019)   Social Connection and Isolation Panel [NHANES]    Frequency of Communication with Friends and Family: More than three times a week    Frequency of Social Gatherings with Friends and Family: Three times a week    Attends Religious Services: More than 4 times per year    Active Member of Clubs or Organizations: Yes    Attends Banker Meetings: More than 4 times per year    Marital Status: Never married  Intimate Partner Violence: Not At Risk (04/04/2019)   Humiliation, Afraid, Rape, and Kick questionnaire    Fear of Current or Ex-Partner: No    Emotionally Abused: No    Physically Abused: No    Sexually Abused: No    FAMILY HISTORY: Family History  Problem Relation Age of Onset   Heart disease Mother    Hypertension Mother    Asthma Mother    Heart  disease Father    Stroke Father    Hypertension Father    Stroke Sister    Asthma Sister    Hypertension Brother    Asthma Brother    Asthma Maternal Grandmother    Heart disease Maternal Grandmother    Diabetes Maternal Grandfather    Asthma Maternal Aunt    Colon cancer Neg Hx     ALLERGIES:  is allergic to hydromorphone and sulfa antibiotics.  MEDICATIONS:  Current Outpatient Medications  Medication Sig Dispense Refill   albuterol (PROAIR HFA) 108 (90 Base) MCG/ACT inhaler Inhale 2  puffs into the lungs every 4 (four) hours as needed for wheezing or shortness of breath. 18 g 3   amLODipine (NORVASC) 5 MG tablet Take 1 tablet (5 mg total) by mouth daily. 30 tablet 1   Benralizumab (FASENRA) 30 MG/ML SOSY Inject 1 mL (30 mg total) into the skin every 8 (eight) weeks. 1 mL 6   CALCIUM PO Take 1,250 mg by mouth.     Cetirizine HCl (ZYRTEC PO) Take by mouth.     Cholecalciferol (VITAMIN D-3 PO) Take 400 mg by mouth.     DULoxetine (CYMBALTA) 30 MG capsule Take 30 mg by mouth 2 (two) times daily.     Ferrous Sulfate (IRON PO) Take 65 mg by mouth.     fluticasone (FLONASE) 50 MCG/ACT nasal spray SPRAY 2 SPRAYS INTO EACH NOSTRIL EVERY DAY 48 mL 1   Fluticasone-Umeclidin-Vilant (TRELEGY ELLIPTA) 200-62.5-25 MCG/ACT AEPB Inhale 1 puff into the lungs in the morning. 90 each 3   leflunomide (ARAVA) 10 MG tablet Take 10 mg by mouth daily.     losartan-hydrochlorothiazide (HYZAAR) 100-12.5 MG tablet Take 1 tablet by mouth daily.     MAGNESIUM PO Take 400 mg by mouth.     Multiple Vitamin (MULTIVITAMIN PO) Take by mouth.     pantoprazole (PROTONIX) 40 MG tablet Take 1 tablet (40 mg total) by mouth daily. 90 tablet 1   predniSONE (DELTASONE) 10 MG tablet Take two tablets ( ) twice daily for three days, then one tablet ( ) twice daily for three days, then STOP. 18 tablet 0   pregabalin (LYRICA) 50 MG capsule Take 50 mg by mouth daily.     simvastatin (ZOCOR) 20 MG tablet Take 20 mg by  mouth at bedtime.      vitamin B-12 (CYANOCOBALAMIN) 1000 MCG tablet Take 1 tablet (1,000 mcg total) by mouth daily. 30 tablet 3   Current Facility-Administered Medications  Medication Dose Route Frequency Provider Last Rate Last Admin   Benralizumab SOSY 30 mg  30 mg Subcutaneous Q8 Thomes Dinning, MD   30 mg at 10/09/22 1427    REVIEW OF SYSTEMS:   Constitutional: ( - ) fevers, ( - )  chills , ( - ) night sweats Eyes: ( - ) blurriness of vision, ( - ) double vision, ( - ) watery eyes Ears, nose, mouth, throat, and face: ( - ) mucositis, ( - ) sore throat Respiratory: ( - ) cough, ( - ) dyspnea, ( - ) wheezes Cardiovascular: ( - ) palpitation, ( - ) chest discomfort, ( - ) lower extremity swelling Gastrointestinal:  ( - ) nausea, ( - ) heartburn, ( -) change in bowel habits Skin: ( - ) abnormal skin rashes Lymphatics: ( - ) new lymphadenopathy, ( - ) easy bruising Neurological: ( - ) numbness, ( - ) tingling, ( - ) new weaknesses Behavioral/Psych: ( - ) mood change, ( - ) new changes  All other systems were reviewed with the patient and are negative.  PHYSICAL EXAMINATION: ECOG PERFORMANCE STATUS: 1 - Symptomatic but completely ambulatory  There were no vitals filed for this visit. There were no vitals filed for this visit.  GENERAL: well appearing female in NAD  SKIN: skin color, texture, turgor are normal, no rashes or significant lesions. EYES: conjunctiva are pink and non-injected, sclera clear LUNGS: clear to auscultation and percussion with normal breathing effort HEART: regular rate & rhythm and no murmurs and no lower extremity edema Musculoskeletal: no cyanosis of digits and  no clubbing  PSYCH: alert & oriented x 3, fluent speech NEURO: no focal motor/sensory deficits  LABORATORY DATA:  I have reviewed the data as listed    Latest Ref Rng & Units 11/14/2022    1:49 PM 05/15/2022    2:42 PM 02/08/2022    1:02 PM  CBC  WBC 4.0 - 10.5 K/uL 13.4  13.7   14.0   Hemoglobin 12.0 - 15.0 g/dL 04.5  40.9  81.1   Hematocrit 36.0 - 46.0 % 36.0  39.2  35.1   Platelets 150 - 400 K/uL 254  289  285        Latest Ref Rng & Units 11/14/2022    1:49 PM 05/15/2022    2:42 PM 02/08/2022    1:02 PM  CMP  Glucose 70 - 99 mg/dL 96  91  90   BUN 8 - 23 mg/dL 20  19  18    Creatinine 0.44 - 1.00 mg/dL 9.14  7.82  9.56   Sodium 135 - 145 mmol/L 143  142  140   Potassium 3.5 - 5.1 mmol/L 3.6  3.6  3.1   Chloride 98 - 111 mmol/L 106  105  101   CO2 22 - 32 mmol/L 30  30  34   Calcium 8.9 - 10.3 mg/dL 21.3  9.9  08.6   Total Protein 6.5 - 8.1 g/dL 7.4  8.0  7.4   Total Bilirubin 0.3 - 1.2 mg/dL 0.5  0.7  0.5   Alkaline Phos 38 - 126 U/L 56  61  68   AST 15 - 41 U/L 18  22  16    ALT 0 - 44 U/L 9  15  9     RADIOGRAPHIC STUDIES: No results found.  ASSESSMENT & PLAN JARAH PEMBER is a 77 y.o. female returns for a follow up for leukocytosis and normocytic anemia.   # Leukocytosis, neutrophil predominant: --Likely secondary to RA with periodic flare ups.  --Workup from 10/16/2021 showed elevation of inflammatory markers. There is no evidence of myeloproliferative disorders with normal MPN panel and  no evidence of BCR/ABL.  --Labs today show  leukocytosis with WBC 13.4, Hgb 11.9, MCV 84.9, Plt 254, ALC 4900 --No evidence of infection as patient is afebrile without any cough, urinary symptoms, etc.  --Monitor for now.  # Normocytic anemia/vitamin B12 deficiency: --Multifactorial with renal dysfunction, RA and use of methotrexate, vitamin B12 deficiency --Labs today show improvement of anemia with Hgb 11.9 --Currently taking PO vitamin B12 replacement 1000 mcg daily, recommend to continue.   #Hypokalemia: --K is 3.6 today. Level has oscillated over the past few months.  --Likely secondary to HCTZ --Sent prescription of potassium chloride 20 mEq daily x 30 days --Patient will need to follow up with PCP to monitor and to determine if refills are  needed.   # Bruising-resolved: --Patient has multiple site of bruising with palpable induration. --Likely secondary to subcutaneous heparin injections patient received during recent hospitalization per record review --Monitor for now.   Follow up: --RTC in 12 months with repeat labs.    No orders of the defined types were placed in this encounter.   All questions were answered. The patient knows to call the clinic with any problems, questions or concerns.  I have spent a total of 30 minutes minutes of face-to-face and non-face-to-face time, preparing to see the patient, performing a medically appropriate examination, counseling and educating the patient, ordering tests/meds, documenting clinical information in the electronic health record,  and care coordination.   Ledell Peoples, MD Department of Hematology/Oncology [facility] Phone: [phone] Pager: [phone] Email: [email or web address]

## 2022-11-14 ENCOUNTER — Other Ambulatory Visit: Payer: Self-pay

## 2022-11-14 ENCOUNTER — Inpatient Hospital Stay: Payer: Medicare Other | Attending: Hematology and Oncology | Admitting: Hematology and Oncology

## 2022-11-14 ENCOUNTER — Inpatient Hospital Stay: Payer: Medicare Other

## 2022-11-14 VITALS — BP 146/80 | HR 94 | Temp 97.7°F | Resp 16 | Wt 154.6 lb

## 2022-11-14 DIAGNOSIS — Z7969 Long term (current) use of other immunomodulators and immunosuppressants: Secondary | ICD-10-CM | POA: Diagnosis not present

## 2022-11-14 DIAGNOSIS — D72829 Elevated white blood cell count, unspecified: Secondary | ICD-10-CM | POA: Diagnosis present

## 2022-11-14 DIAGNOSIS — E876 Hypokalemia: Secondary | ICD-10-CM | POA: Diagnosis not present

## 2022-11-14 DIAGNOSIS — D72825 Bandemia: Secondary | ICD-10-CM

## 2022-11-14 DIAGNOSIS — Z79899 Other long term (current) drug therapy: Secondary | ICD-10-CM | POA: Insufficient documentation

## 2022-11-14 DIAGNOSIS — E538 Deficiency of other specified B group vitamins: Secondary | ICD-10-CM

## 2022-11-14 DIAGNOSIS — D649 Anemia, unspecified: Secondary | ICD-10-CM

## 2022-11-14 LAB — CBC WITH DIFFERENTIAL (CANCER CENTER ONLY)
Abs Immature Granulocytes: 0.06 10*3/uL (ref 0.00–0.07)
Basophils Absolute: 0.1 10*3/uL (ref 0.0–0.1)
Basophils Relative: 0 %
Eosinophils Absolute: 0 10*3/uL (ref 0.0–0.5)
Eosinophils Relative: 0 %
HCT: 36 % (ref 36.0–46.0)
Hemoglobin: 11.9 g/dL — ABNORMAL LOW (ref 12.0–15.0)
Immature Granulocytes: 0 %
Lymphocytes Relative: 36 %
Lymphs Abs: 4.9 10*3/uL — ABNORMAL HIGH (ref 0.7–4.0)
MCH: 28.1 pg (ref 26.0–34.0)
MCHC: 33.1 g/dL (ref 30.0–36.0)
MCV: 84.9 fL (ref 80.0–100.0)
Monocytes Absolute: 1 10*3/uL (ref 0.1–1.0)
Monocytes Relative: 7 %
Neutro Abs: 7.5 10*3/uL (ref 1.7–7.7)
Neutrophils Relative %: 57 %
Platelet Count: 254 10*3/uL (ref 150–400)
RBC: 4.24 MIL/uL (ref 3.87–5.11)
RDW: 13.7 % (ref 11.5–15.5)
WBC Count: 13.4 10*3/uL — ABNORMAL HIGH (ref 4.0–10.5)
nRBC: 0 % (ref 0.0–0.2)

## 2022-11-14 LAB — CMP (CANCER CENTER ONLY)
ALT: 9 U/L (ref 0–44)
AST: 18 U/L (ref 15–41)
Albumin: 4.1 g/dL (ref 3.5–5.0)
Alkaline Phosphatase: 56 U/L (ref 38–126)
Anion gap: 7 (ref 5–15)
BUN: 20 mg/dL (ref 8–23)
CO2: 30 mmol/L (ref 22–32)
Calcium: 10.1 mg/dL (ref 8.9–10.3)
Chloride: 106 mmol/L (ref 98–111)
Creatinine: 1.31 mg/dL — ABNORMAL HIGH (ref 0.44–1.00)
GFR, Estimated: 42 mL/min — ABNORMAL LOW (ref >60)
Glucose, Bld: 96 mg/dL (ref 70–99)
Potassium: 3.6 mmol/L (ref 3.5–5.1)
Sodium: 143 mmol/L (ref 135–145)
Total Bilirubin: 0.5 mg/dL (ref 0.3–1.2)
Total Protein: 7.4 g/dL (ref 6.5–8.1)

## 2022-11-14 LAB — VITAMIN B12: Vitamin B-12: 707 pg/mL (ref 180–914)

## 2022-11-15 LAB — SURGICAL PATHOLOGY

## 2022-11-18 LAB — FLOW CYTOMETRY

## 2022-11-19 LAB — METHYLMALONIC ACID, SERUM: Methylmalonic Acid, Quantitative: 194 nmol/L (ref 0–378)

## 2022-11-26 DIAGNOSIS — J4550 Severe persistent asthma, uncomplicated: Secondary | ICD-10-CM

## 2022-12-02 ENCOUNTER — Ambulatory Visit: Payer: Medicare Other

## 2022-12-06 ENCOUNTER — Ambulatory Visit: Payer: Medicare Other

## 2022-12-11 ENCOUNTER — Ambulatory Visit (INDEPENDENT_AMBULATORY_CARE_PROVIDER_SITE_OTHER): Payer: Medicare Other

## 2022-12-11 DIAGNOSIS — J4550 Severe persistent asthma, uncomplicated: Secondary | ICD-10-CM | POA: Diagnosis not present

## 2022-12-27 DIAGNOSIS — J4550 Severe persistent asthma, uncomplicated: Secondary | ICD-10-CM

## 2022-12-31 ENCOUNTER — Encounter (INDEPENDENT_AMBULATORY_CARE_PROVIDER_SITE_OTHER): Payer: Self-pay | Admitting: *Deleted

## 2023-01-21 ENCOUNTER — Emergency Department (HOSPITAL_COMMUNITY)
Admission: EM | Admit: 2023-01-21 | Discharge: 2023-01-21 | Disposition: A | Discharge status: Home / Self Care | Payer: Medicare Other | Attending: Emergency Medicine | Admitting: Emergency Medicine

## 2023-01-21 ENCOUNTER — Other Ambulatory Visit: Payer: Self-pay

## 2023-01-21 ENCOUNTER — Encounter (HOSPITAL_COMMUNITY): Payer: Self-pay | Admitting: Emergency Medicine

## 2023-01-21 ENCOUNTER — Emergency Department (HOSPITAL_COMMUNITY): Payer: Medicare Other

## 2023-01-21 DIAGNOSIS — E876 Hypokalemia: Secondary | ICD-10-CM | POA: Insufficient documentation

## 2023-01-21 DIAGNOSIS — J441 Chronic obstructive pulmonary disease with (acute) exacerbation: Secondary | ICD-10-CM | POA: Insufficient documentation

## 2023-01-21 DIAGNOSIS — Z7951 Long term (current) use of inhaled steroids: Secondary | ICD-10-CM | POA: Diagnosis not present

## 2023-01-21 DIAGNOSIS — R0602 Shortness of breath: Secondary | ICD-10-CM | POA: Diagnosis present

## 2023-01-21 LAB — CBC
HCT: 38.7 % (ref 36.0–46.0)
Hemoglobin: 13 g/dL (ref 12.0–15.0)
MCH: 27 pg (ref 26.0–34.0)
MCHC: 33.6 g/dL (ref 30.0–36.0)
MCV: 80.3 fL (ref 80.0–100.0)
Platelets: 266 10*3/uL (ref 150–400)
RBC: 4.82 MIL/uL (ref 3.87–5.11)
RDW: 14.2 % (ref 11.5–15.5)
WBC: 11.9 10*3/uL — ABNORMAL HIGH (ref 4.0–10.5)
nRBC: 0 % (ref 0.0–0.2)

## 2023-01-21 LAB — COMPREHENSIVE METABOLIC PANEL
ALT: 17 U/L (ref 0–44)
AST: 29 U/L (ref 15–41)
Albumin: 3.8 g/dL (ref 3.5–5.0)
Alkaline Phosphatase: 65 U/L (ref 38–126)
Anion gap: 14 (ref 5–15)
BUN: 21 mg/dL (ref 8–23)
CO2: 24 mmol/L (ref 22–32)
Calcium: 9.6 mg/dL (ref 8.9–10.3)
Chloride: 101 mmol/L (ref 98–111)
Creatinine, Ser: 1.35 mg/dL — ABNORMAL HIGH (ref 0.44–1.00)
GFR, Estimated: 41 mL/min — ABNORMAL LOW (ref >60)
Glucose, Bld: 114 mg/dL — ABNORMAL HIGH (ref 70–99)
Potassium: 2.6 mmol/L — CL (ref 3.5–5.1)
Sodium: 139 mmol/L (ref 135–145)
Total Bilirubin: 1.1 mg/dL (ref 0.3–1.2)
Total Protein: 7.4 g/dL (ref 6.5–8.1)

## 2023-01-21 LAB — D-DIMER, QUANTITATIVE: D-Dimer, Quant: 0.4 ug/mL-FEU (ref 0.00–0.50)

## 2023-01-21 LAB — BRAIN NATRIURETIC PEPTIDE: B Natriuretic Peptide: 53 pg/mL (ref 0.0–100.0)

## 2023-01-21 LAB — TROPONIN I (HIGH SENSITIVITY)
Troponin I (High Sensitivity): 11 ng/L (ref <18)
Troponin I (High Sensitivity): 9 ng/L (ref <18)

## 2023-01-21 MED ORDER — ALBUTEROL SULFATE (2.5 MG/3ML) 0.083% IN NEBU: 3.0000 mL | INHALATION_SOLUTION | RESPIRATORY_TRACT | Status: DC | PRN

## 2023-01-21 MED ORDER — PREDNISONE 50 MG PO TABS
60.0000 mg | ORAL_TABLET | Freq: Once | ORAL | Status: AC
  Administered 2023-01-21: 60 mg via ORAL
  Filled 2023-01-21: qty 1

## 2023-01-21 MED ORDER — POTASSIUM CHLORIDE CRYS ER 20 MEQ PO TBCR
40.0000 meq | EXTENDED_RELEASE_TABLET | Freq: Once | ORAL | Status: AC
  Administered 2023-01-21: 40 meq via ORAL
  Filled 2023-01-21: qty 2

## 2023-01-21 MED ORDER — LORAZEPAM 2 MG/ML IJ SOLN
0.5000 mg | Freq: Once | INTRAMUSCULAR | Status: AC
  Administered 2023-01-21: 0.5 mg via INTRAVENOUS
  Filled 2023-01-21: qty 1

## 2023-01-21 MED ORDER — HYDROXYZINE HCL 25 MG PO TABS: 25.0000 mg | ORAL_TABLET | Freq: Three times a day (TID) | ORAL | 0 refills | Status: DC | PRN

## 2023-01-21 MED ORDER — POTASSIUM CHLORIDE CRYS ER 20 MEQ PO TBCR: 20.0000 meq | EXTENDED_RELEASE_TABLET | Freq: Every day | ORAL | 0 refills | Status: DC

## 2023-01-21 MED ORDER — PREDNISONE 10 MG PO TABS: 20.0000 mg | ORAL_TABLET | Freq: Every day | ORAL | 0 refills | Status: DC

## 2023-01-21 MED ORDER — POTASSIUM CHLORIDE 10 MEQ/100ML IV SOLN
10.0000 meq | Freq: Once | INTRAVENOUS | Status: AC
  Administered 2023-01-21: 10 meq via INTRAVENOUS
  Filled 2023-01-21: qty 100

## 2023-01-21 NOTE — Discharge Instructions (Signed)
Follow-up with your family doctor either the end of this week or beginning of next week for recheck

## 2023-01-21 NOTE — ED Triage Notes (Signed)
Pt c/o SOB x 4 days but worse today with chest pain/tightness, dizziness, and weakness. She has hx asthma, fibromyalgia, OA, HTN, HLD. She used her rescue meds earlier today without relief. Right-sided CP currently rated 7/10 and nonradiating. Pt seems quite anxious in triage.

## 2023-01-21 NOTE — ED Provider Notes (Signed)
[place] EMERGENCY DEPARTMENT AT [facility] Provider Note   CSN: [id number] Arrival date & time: 01/21/23  1500     History {Add pertinent medical, surgical, social history, OB history to HPI:1} Chief Complaint  Patient presents with   Shortness of Breath    Betty Golden is a 77 y.o. female.  Patient complains of some shortness of breath.  She has a history of COPD.   Shortness of Breath      Home Medications Prior to Admission medications   Medication Sig Start Date End Date Taking? Authorizing Provider  hydrOXYzine (ATARAX) 25 MG tablet Take 1 tablet (25 mg total) by mouth every 8 (eight) hours as needed for anxiety. 01/21/23  Yes Bethann Berkshire, MD  potassium chloride SA (KLOR-CON M) 20 MEQ tablet Take 1 tablet (20 mEq total) by mouth daily. 01/21/23  Yes Bethann Berkshire, MD  predniSONE (DELTASONE) 10 MG tablet Take 2 tablets (20 mg total) by mouth daily. 01/21/23  Yes Bethann Berkshire, MD  albuterol (PROAIR HFA) 108 (90 Base) MCG/ACT inhaler Inhale 2 puffs into the lungs every 4 (four) hours as needed for wheezing or shortness of breath. 10/31/21   Alfonse Spruce, MD  amLODipine (NORVASC) 5 MG tablet Take 1 tablet (5 mg total) by mouth daily. 10/01/21   Johnson, Clanford L, MD  Benralizumab (FASENRA) 30 MG/ML SOSY Inject 1 mL (30 mg total) into the skin every 8 (eight) weeks. 09/24/22   Alfonse Spruce, MD  CALCIUM PO Take 1,250 mg by mouth.    [provider]  Cetirizine HCl (ZYRTEC PO) Take by mouth.    [provider]  Cholecalciferol (VITAMIN D-3 PO) Take 400 mg by mouth.    [provider]  DULoxetine (CYMBALTA) 30 MG capsule Take 30 mg by mouth 2 (two) times daily.    [provider]  Ferrous Sulfate (IRON PO) Take 65 mg by mouth.    [provider]  fluticasone (FLONASE) 50 MCG/ACT nasal spray SPRAY 2 SPRAYS INTO EACH NOSTRIL EVERY DAY 04/06/21   Alfonse Spruce, MD  Fluticasone-Umeclidin-Vilant  (TRELEGY ELLIPTA) 200-62.5-25 MCG/ACT AEPB Inhale 1 puff into the lungs in the morning. 10/09/22   Alfonse Spruce, MD  leflunomide (ARAVA) 10 MG tablet Take 10 mg by mouth daily.    [provider]  losartan-hydrochlorothiazide (HYZAAR) 100-12.5 MG tablet Take 1 tablet by mouth daily. 09/09/21   [provider]  MAGNESIUM PO Take 400 mg by mouth.    [provider]  Multiple Vitamin (MULTIVITAMIN PO) Take by mouth.    [provider]  pantoprazole (PROTONIX) 40 MG tablet Take 1 tablet (40 mg total) by mouth daily. 10/09/22 01/07/23  Alfonse Spruce, MD  pregabalin (LYRICA) 50 MG capsule Take 50 mg by mouth daily.    [provider]  simvastatin (ZOCOR) 20 MG tablet Take 20 mg by mouth at bedtime.     [provider]  vitamin B-12 (CYANOCOBALAMIN) 1000 MCG tablet Take 1 tablet (1,000 mcg total) by mouth daily. 10/24/21   Briant Cedar, PA-C      Allergies    Hydromorphone and Sulfa antibiotics    Review of Systems   Review of Systems  Respiratory:  Positive for shortness of breath.     Physical Exam Updated Vital Signs BP (!) 151/105 (BP Location: Left Arm)   Pulse 79   Temp 97.6 F (36.4 C) (Oral)   Resp 20   Ht 5' 2.5" (1.588 m)  Wt 68.5 kg   SpO2 99%   BMI 27.18 kg/m  Physical Exam  ED Results / Procedures / Treatments   Labs (all labs ordered are listed, but only abnormal results are displayed) Labs Reviewed  CBC - Abnormal; Notable for the following components:      Result Value   WBC 11.9 (*)    All other components within normal limits  COMPREHENSIVE METABOLIC PANEL - Abnormal; Notable for the following components:   Potassium 2.6 (*)    Glucose, Bld 114 (*)    Creatinine, Ser 1.35 (*)    GFR, Estimated 41 (*)    All other components within normal limits  BRAIN NATRIURETIC PEPTIDE  D-DIMER, QUANTITATIVE  TROPONIN I (HIGH SENSITIVITY)  TROPONIN I (HIGH SENSITIVITY)    EKG EKG  Interpretation  Date/Time:  Tuesday January 21 2023 16:36:34 EDT Ventricular Rate:  92 PR Interval:  168 QRS Duration: 110 QT Interval:  413 QTC Calculation: 511 R Axis:   7 Text Interpretation: Age not entered, assumed to be   77 years old for purpose of ECG interpretation Sinus rhythm Consider left atrial enlargement Left bundle branch block Prolonged QT, probably secondary to wide QRS Confirmed by Bethann Berkshire [phone]) on 01/21/2023 8:35:19 PM  Radiology DG Chest 2 View  Result Date: 01/21/2023 CLINICAL DATA:  Four day history of shortness of breath acutely worsened today with chest tightness dizziness, and weakness EXAM: CHEST - 2 VIEW COMPARISON:  Chest radiograph dated 10/26/2021 FINDINGS: Normal lung volumes. No focal consolidations. No pleural effusion or pneumothorax. The heart size and mediastinal contours are within normal limits. No acute osseous abnormality. IMPRESSION: No active cardiopulmonary disease. Electronically Signed   By: Agustin Cree M.D.   On: 01/21/2023 15:44    Procedures Procedures  {Document cardiac monitor, telemetry assessment procedure when appropriate:1}  Medications Ordered in ED Medications  albuterol (PROVENTIL) (2.5 MG/3ML) 0.083% nebulizer solution 3 mL (has no administration in time range)  predniSONE (DELTASONE) tablet 60 mg (has no administration in time range)  LORazepam (ATIVAN) injection 0.5 mg (0.5 mg Intravenous Given 01/21/23 1627)  potassium chloride 10 mEq in 100 mL IVPB (0 mEq Intravenous Stopped 01/21/23 1934)  potassium chloride SA (KLOR-CON M) CR tablet 40 mEq (40 mEq Oral Given 01/21/23 1806)    ED Course/ Medical Decision Making/ A&P   {   Click here for ABCD2, HEART and other calculatorsREFRESH Note before signing :1}                          Medical Decision Making Amount and/or Complexity of Data Reviewed Labs: ordered. Radiology: ordered.  Risk Prescription drug management.   Patient with exacerbation of her COPD causing mild  shortness of breath.  Also hypokalemia and anxiety.  Patient given Vistaril potassium and prednisone will follow-up with PCP  {Document critical care time when appropriate:1} {Document review of labs and clinical decision tools ie heart score, Chads2Vasc2 etc:1}  {Document your independent review of radiology images, and any outside records:1} {Document your discussion with family members, caretakers, and with consultants:1} {Document social determinants of health affecting pt's care:1} {Document your decision making why or why not admission, treatments were needed:1} Final Clinical Impression(s) / ED Diagnoses Final diagnoses:  Hypokalemia  COPD exacerbation (HCC)    Rx / DC Orders ED Discharge Orders          Ordered    potassium chloride SA (KLOR-CON M) 20 MEQ tablet  Daily  01/21/23 2045    predniSONE (DELTASONE) 10 MG tablet  Daily        01/21/23 2045    hydrOXYzine (ATARAX) 25 MG tablet  Every 8 hours PRN        01/21/23 2045

## 2023-01-26 DIAGNOSIS — J4550 Severe persistent asthma, uncomplicated: Secondary | ICD-10-CM

## 2023-01-27 ENCOUNTER — Ambulatory Visit (HOSPITAL_BASED_OUTPATIENT_CLINIC_OR_DEPARTMENT_OTHER): Payer: Medicare Other | Admitting: Physical Therapy

## 2023-02-05 ENCOUNTER — Ambulatory Visit (INDEPENDENT_AMBULATORY_CARE_PROVIDER_SITE_OTHER): Payer: Medicare Other

## 2023-02-05 DIAGNOSIS — J4550 Severe persistent asthma, uncomplicated: Secondary | ICD-10-CM

## 2023-02-06 ENCOUNTER — Encounter (INDEPENDENT_AMBULATORY_CARE_PROVIDER_SITE_OTHER): Payer: Self-pay | Admitting: Gastroenterology

## 2023-02-06 ENCOUNTER — Ambulatory Visit (INDEPENDENT_AMBULATORY_CARE_PROVIDER_SITE_OTHER): Payer: Medicare Other | Admitting: Gastroenterology

## 2023-02-06 VITALS — BP 126/74 | Temp 98.0°F | Ht 62.5 in | Wt 142.1 lb

## 2023-02-06 DIAGNOSIS — R159 Full incontinence of feces: Secondary | ICD-10-CM | POA: Insufficient documentation

## 2023-02-06 NOTE — Progress Notes (Signed)
Katrinka Blazing, M.D. Gastroenterology & Hepatology [facility] Gastroenterology [address] [place], [place] [address] Primary Care Physician: Exie Parody, MD [address] [place] [address]  Referring MD: PCP  Chief Complaint: Fecal soiling  History of Present Illness: Betty Golden is a 77 y.o. female with past medical history of asthma, C. difficile colitis, depression, diverticulosis, fibromyalgia, GERD, hyperlipidemia, FBM, IBS, rheumatoid arthritis, who presents for evaluation of fecal soiling.  Patient reports that for the last 3 years, she has noticed that she has presented recurrent episodes of fecal soiling 4-6 times per day. Sometimes she does not notice she has had a bowel movement until she notices there is stool smell. She reports that she is now wearing diapers as she does not know when these episodes may happen. States stools vary between watery and soft.  Sometimes may wake up with stool in the morning.  She feels nauseated frequently but denies any vomiting.  Stats that she has presented some mid abdominal pain for the last 3-4 days, which was not present before. Seems that for the last day it has been subsiding.  The patient denies having any fever, chills, hematochezia, melena, hematemesis, abdominal distention,  diarrhea, jaundice, pruritus. Has lost 7 lb since may as her appetite has decreased with her other active illnesses.  Most recent labs from 01/21/2023 showed a potassium of 2.6, creatinine 1.35, normal LFTs and other electrolytes, CBC with WBC 11.9, other cell lines within normal limits.  Last EGD:2020 performed by Dr. Karilyn Cota for epigastric pain Normal esophagus, healed ulcer in the posterior wall of the gastric antrum, congestion in the antrum, normal duodenum. Last Colonoscopy:2020  performed by Dr. Karilyn Cota for rectal bleeding Diverticulosis, external hemorrhoids  FHx: neg for any gastrointestinal/liver disease, no  malignancies Social: neg smoking, alcohol or illicit drug use Surgical: appendectomy and cholecystectomy  Past Medical History: Past Medical History:  Diagnosis Date   Asthma    C. difficile colitis    Cervical stenosis of spine    Chronic headaches    Chronic neck pain    Coronary artery disease    Depression    Diverticulosis    Essential hypertension    Fibromyalgia    GERD (gastroesophageal reflux disease)    History of Salmonella gastroenteritis    HNP (herniated nucleus pulposus), lumbar    Hyperlipidemia    IBS (irritable bowel syndrome)    Lung nodule    rheumatoid arthritis     Past Surgical History: Past Surgical History:  Procedure Laterality Date   APPENDECTOMY  1965   BIOPSY  08/26/2018   Procedure: BIOPSY;  Surgeon: Malissa Hippo, MD;  Location: [facility];  Service: Endoscopy;;  gastric   BREAST BIOPSY     BREAST REDUCTION SURGERY  1994   CHOLECYSTECTOMY  1975   COLONOSCOPY N/A 08/19/2013   Procedure: COLONOSCOPY;  Surgeon: Malissa Hippo, MD;  Location: [facility];  Service: Endoscopy;  Laterality: N/A;  155-moved to 140 Ann to notify pt   COLONOSCOPY N/A 08/26/2018   Procedure: COLONOSCOPY;  Surgeon: Malissa Hippo, MD;  Location: [facility];  Service: Endoscopy;  Laterality: N/A;   ESOPHAGOGASTRODUODENOSCOPY N/A 08/26/2018   Procedure: ESOPHAGOGASTRODUODENOSCOPY (EGD);  Surgeon: Malissa Hippo, MD;  Location: [facility];  Service: Endoscopy;  Laterality: N/A;   fibromyalgia     LUMBAR LAMINECTOMY/DECOMPRESSION MICRODISCECTOMY Bilateral 09/20/2015   Procedure: MICRO LUMBAR DECOMPRESSION L5-S1 BILATERAL    (1 LEVEL);  Surgeon: Tinnie Gens  Shelle Iron, MD;  Location: [facility];  Service: Orthopedics;  Laterality: Bilateral;   rheumatoid arthritis     Sinus Surgery     TONSILLECTOMY  1968    Family History: Family History  Problem Relation Age of Onset   Heart disease Mother    Hypertension Mother    Asthma Mother    Heart disease Father     Stroke Father    Hypertension Father    Stroke Sister    Asthma Sister    Hypertension Brother    Asthma Brother    Asthma Maternal Grandmother    Heart disease Maternal Grandmother    Diabetes Maternal Grandfather    Asthma Maternal Aunt    Colon cancer Neg Hx     Social History: Social History   Tobacco Use  Smoking Status Former   Current packs/day: 0.00   Average packs/day: 2.0 packs/day for 10.0 years (20.0 ttl pk-yrs)   Types: Cigarettes   Start date: 09/11/1978   Quit date: 09/11/1988   Years since quitting: 34.4  Smokeless Tobacco Never  Tobacco Comments   quit 1990   Social History   Substance and Sexual Activity  Alcohol Use No   Comment: stopped in 1990   Social History   Substance and Sexual Activity  Drug Use No   Comment: hx smoking marijuana and cocaine weekends. Stopped in the 1990s.    Allergies: Allergies  Allergen Reactions   Hydromorphone Anaphylaxis   Sulfa Antibiotics Rash    Medications: Current Outpatient Medications  Medication Sig Dispense Refill   albuterol (PROAIR HFA) 108 (90 Base) MCG/ACT inhaler Inhale 2 puffs into the lungs every 4 (four) hours as needed for wheezing or shortness of breath. 18 g 3   amLODipine (NORVASC) 5 MG tablet Take 1 tablet (5 mg total) by mouth daily. 30 tablet 1   Benralizumab (FASENRA) 30 MG/ML SOSY Inject 1 mL (30 mg total) into the skin every 8 (eight) weeks. 1 mL 6   CALCIUM PO Take 1,250 mg by mouth.     Cetirizine HCl (ZYRTEC PO) Take by mouth.     Cholecalciferol (VITAMIN D-3 PO) Take 400 mg by mouth.     DULoxetine (CYMBALTA) 30 MG capsule Take 30 mg by mouth 2 (two) times daily.     Ferrous Sulfate (IRON PO) Take 65 mg by mouth daily.     fluticasone (FLONASE) 50 MCG/ACT nasal spray SPRAY 2 SPRAYS INTO EACH NOSTRIL EVERY DAY 48 mL 1   Fluticasone-Umeclidin-Vilant (TRELEGY ELLIPTA) 200-62.5-25 MCG/ACT AEPB Inhale 1 puff into the lungs in the morning. 90 each 3   hydrOXYzine (ATARAX) 25 MG tablet  Take 1 tablet (25 mg total) by mouth every 8 (eight) hours as needed for anxiety. 12 tablet 0   leflunomide (ARAVA) 10 MG tablet Take 10 mg by mouth daily.     losartan-hydrochlorothiazide (HYZAAR) 100-12.5 MG tablet Take 1 tablet by mouth daily.     MAGNESIUM PO Take 400 mg by mouth.     Multiple Vitamin (MULTIVITAMIN PO) Take by mouth.     pantoprazole (PROTONIX) 40 MG tablet Take 1 tablet (40 mg total) by mouth daily. 90 tablet 1   potassium chloride SA (KLOR-CON M) 20 MEQ tablet Take 1 tablet (20 mEq total) by mouth daily. 6 tablet 0   simvastatin (ZOCOR) 20 MG tablet Take 20 mg by mouth at bedtime.      vitamin B-12 (CYANOCOBALAMIN) 1000 MCG tablet Take 1 tablet (1,000 mcg total) by mouth daily. 30  tablet 3   Current Facility-Administered Medications  Medication Dose Route Frequency Provider Last Rate Last Admin   Benralizumab SOSY 30 mg  30 mg Subcutaneous Q8 Thomes Dinning, MD   30 mg at 02/05/23 1425    Review of Systems: GENERAL: negative for malaise, night sweats HEENT: No changes in hearing or vision, no nose bleeds or other nasal problems. NECK: Negative for lumps, goiter, pain and significant neck swelling RESPIRATORY: Negative for cough, wheezing CARDIOVASCULAR: Negative for chest pain, leg swelling, palpitations, orthopnea GI: SEE HPI MUSCULOSKELETAL: Negative for joint pain or swelling, back pain, and muscle pain. SKIN: Negative for lesions, rash PSYCH: Negative for sleep disturbance, mood disorder and recent psychosocial stressors. HEMATOLOGY Negative for prolonged bleeding, bruising easily, and swollen nodes. ENDOCRINE: Negative for cold or heat intolerance, polyuria, polydipsia and goiter. NEURO: negative for tremor, gait imbalance, syncope and seizures. The remainder of the review of systems is noncontributory.   Physical Exam: BP 126/74 (BP Location: Left Arm, Patient Position: Sitting, Cuff Size: Large)   Temp 98 F (36.7 C) (Oral)   Ht 5' 2.5"  (1.588 m)   Wt 142 lb 1.6 oz (64.5 kg)   BMI 25.58 kg/m  GENERAL: The patient is AO x3, in no acute distress. HEENT: Head is normocephalic and atraumatic. EOMI are intact. Mouth is well hydrated and without lesions. NECK: Supple. No masses LUNGS: Clear to auscultation. No presence of rhonchi/wheezing/rales. Adequate chest expansion HEART: RRR, normal s1 and s2. ABDOMEN: Soft, nontender, no guarding, no peritoneal signs, and nondistended. BS +. No masses. RECTAL EXAM: medium sized non thrombosed external hemorrhoids, mildly decreased tone but no abnormalities with bearing, squeezing or straining, adequate descent with straining, no masses, brown stool without blood. Chaperone: Joanette Gula, CMA EXTREMITIES: Without any cyanosis, clubbing, rash, lesions or edema. NEUROLOGIC: AOx3, no focal motor deficit. SKIN: no jaundice, no rashes   Imaging/Labs: as above  I personally reviewed and interpreted the available labs, imaging and endoscopic files.  Impression and Plan: Betty Golden is a 77 y.o. female with past medical history of asthma, C. difficile colitis, depression, diverticulosis, fibromyalgia, GERD, hyperlipidemia, FBM, IBS, rheumatoid arthritis, who presents for evaluation of fecal soiling.  The patient has presented recurrent episodes of fecal soiling and some incontinence events of unclear etiology.  During her rectal exam today she was found to have mildly decreased tone but the rest of the DRE was normal.  We may get a better picture of the reason of her symptoms with an anorectal manometry.  She may benefit from now from starting Benefiber, but if her symptoms were to persist can start her on prescription colestipol.  - Referral for anorectal manometry - Start Benefiber fiber supplements daily to increase stool bulk - start with one tablespoon daily, can increase to 3 per day - If worsening diarrhea with Benefiber, please call us to start colestipol  All questions were  answered.      Katrinka Blazing, MD Gastroenterology and Hepatology [facility] Gastroenterology

## 2023-02-06 NOTE — Patient Instructions (Addendum)
Referral for anorectal manometry Start Benefiber fiber supplements daily to increase stool bulk - start with one tablespoon daily, can increase to 3 per day If worsening diarrhea with Benefiber, please call us to start colestipol

## 2023-02-26 DIAGNOSIS — J4550 Severe persistent asthma, uncomplicated: Secondary | ICD-10-CM

## 2023-03-29 DIAGNOSIS — J4550 Severe persistent asthma, uncomplicated: Secondary | ICD-10-CM | POA: Diagnosis not present

## 2023-04-01 IMAGING — DX DG CHEST 2V
2 series · 2 of 2 positions shown · non-contrast
Comparison: 04/16/2019 radiograph, 04/07/2019 CT

CLINICAL DATA: Shortness of breath

EXAM:
CHEST - 2 VIEW

[chest lat]
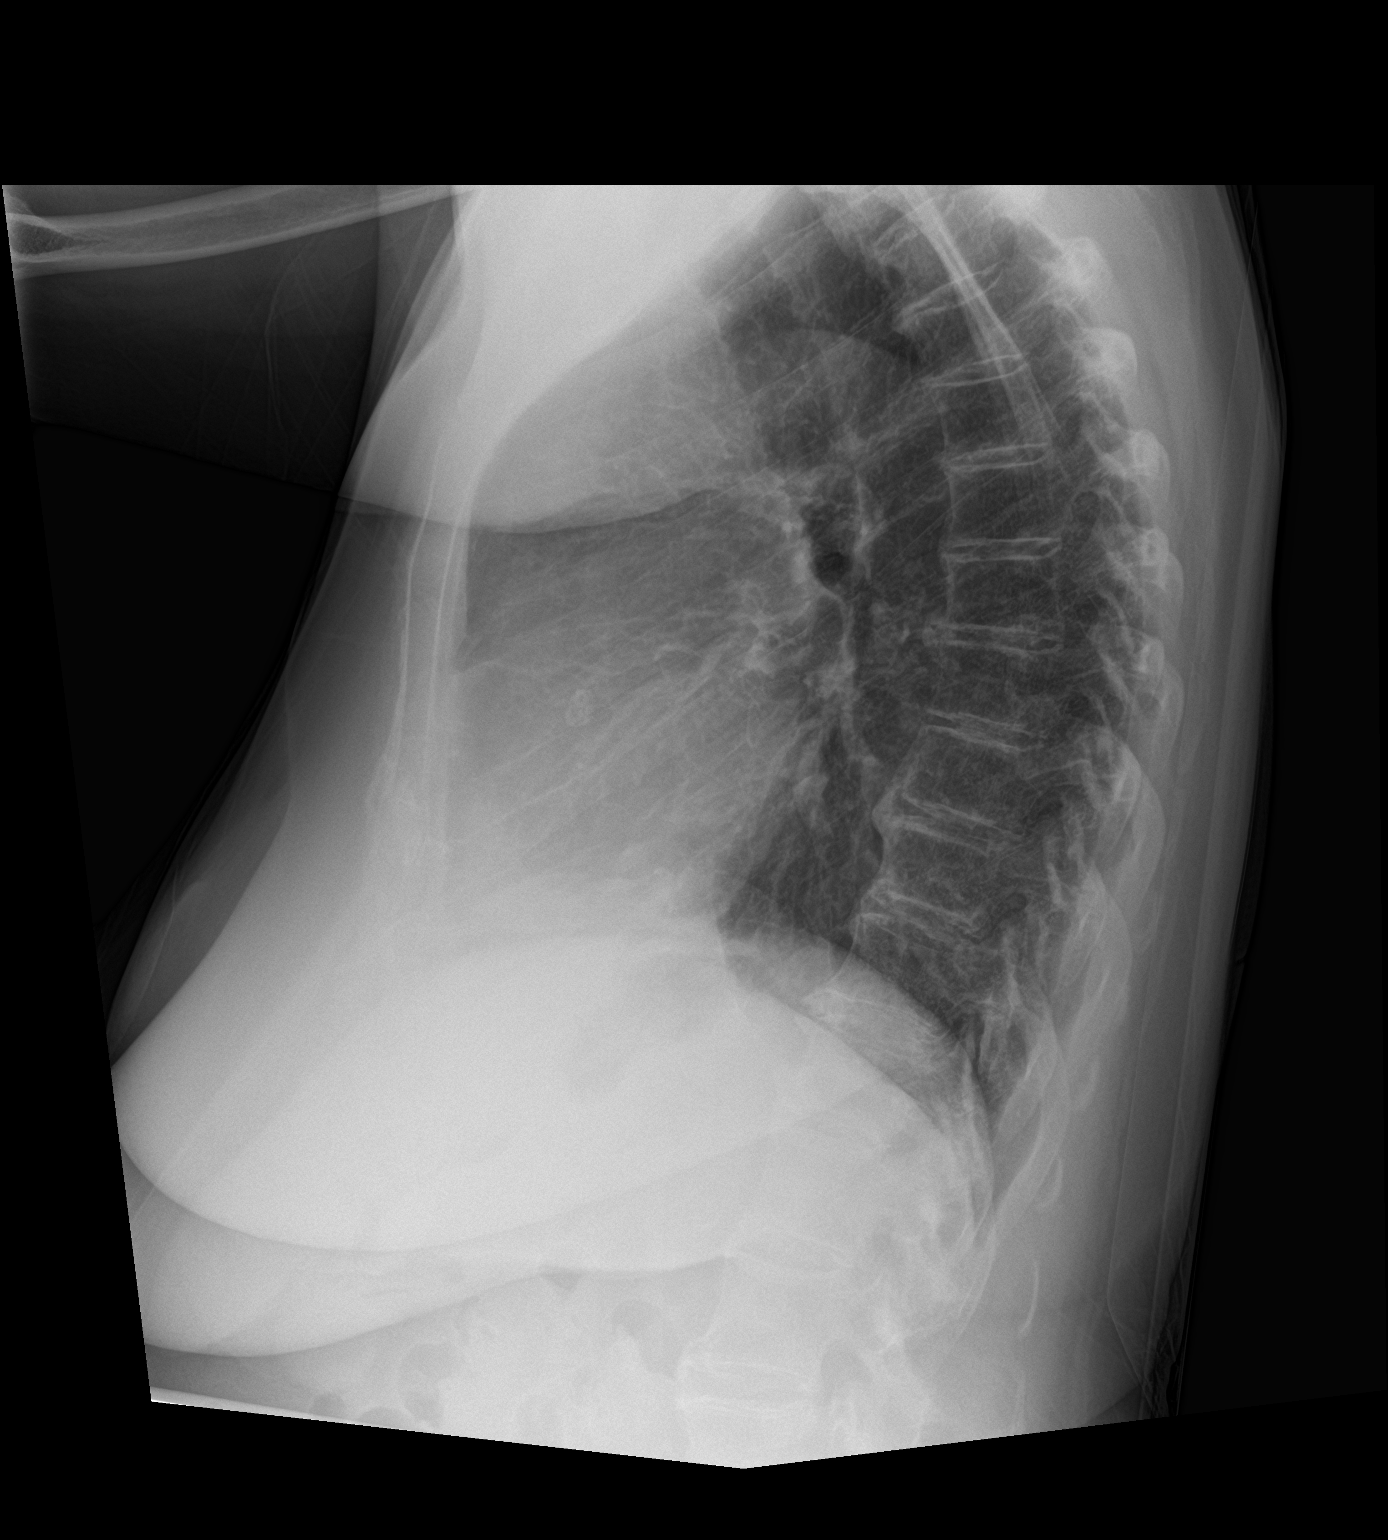

[chest ap]
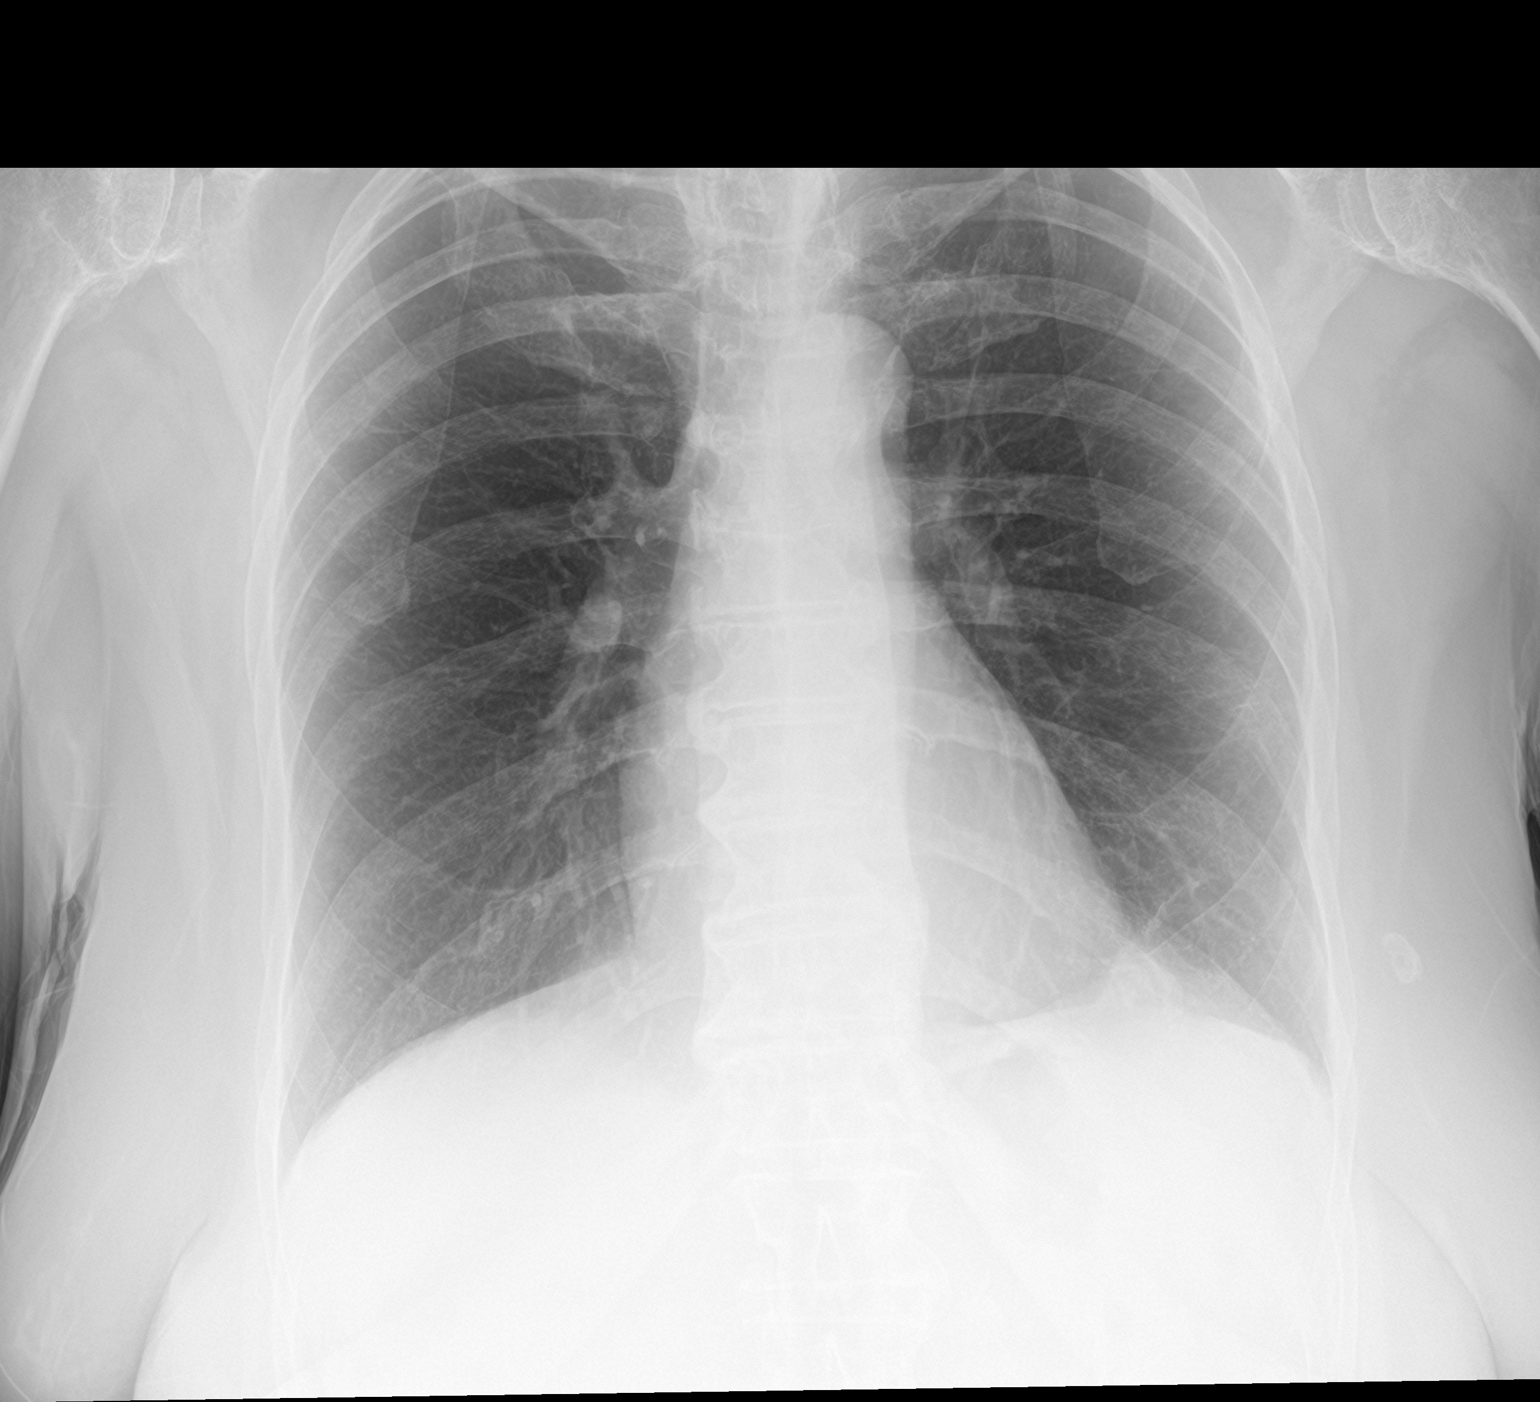

[2 of 2 positions shown; findings below may reference images not displayed]

FINDINGS: No consolidation, features of edema, pneumothorax, or effusion.
Pulmonary vascularity is normally distributed. The cardiomediastinal
contours are unremarkable. No acute osseous or soft tissue
abnormality. Coarse calcification in the left breast, stable from
prior CT.
IMPRESSION: No acute cardiopulmonary abnormality.

## 2023-04-02 ENCOUNTER — Ambulatory Visit: Payer: Medicare Other

## 2023-04-09 ENCOUNTER — Ambulatory Visit: Payer: Medicare Other

## 2023-04-09 ENCOUNTER — Ambulatory Visit: Payer: Medicare Other | Admitting: Allergy & Immunology

## 2023-04-16 ENCOUNTER — Other Ambulatory Visit (INDEPENDENT_AMBULATORY_CARE_PROVIDER_SITE_OTHER): Payer: Self-pay | Admitting: Gastroenterology

## 2023-04-16 ENCOUNTER — Telehealth (INDEPENDENT_AMBULATORY_CARE_PROVIDER_SITE_OTHER): Payer: Self-pay | Admitting: Gastroenterology

## 2023-04-16 ENCOUNTER — Encounter: Payer: Self-pay | Admitting: Allergy & Immunology

## 2023-04-16 ENCOUNTER — Ambulatory Visit: Payer: Medicare Other | Admitting: Allergy & Immunology

## 2023-04-16 ENCOUNTER — Ambulatory Visit: Payer: Medicare Other

## 2023-04-16 VITALS — BP 120/76 | HR 98 | Temp 98.3°F | Resp 16 | Wt 143.2 lb

## 2023-04-16 DIAGNOSIS — J3089 Other allergic rhinitis: Secondary | ICD-10-CM

## 2023-04-16 DIAGNOSIS — J4550 Severe persistent asthma, uncomplicated: Secondary | ICD-10-CM | POA: Diagnosis not present

## 2023-04-16 DIAGNOSIS — B999 Unspecified infectious disease: Secondary | ICD-10-CM

## 2023-04-16 DIAGNOSIS — R151 Fecal smearing: Secondary | ICD-10-CM

## 2023-04-16 MED ORDER — ALBUTEROL SULFATE HFA 108 (90 BASE) MCG/ACT IN AERS: 2.0000 | INHALATION_SPRAY | RESPIRATORY_TRACT | 3 refills | Status: DC | PRN

## 2023-04-16 MED ORDER — TRELEGY ELLIPTA 200-62.5-25 MCG/ACT IN AEPB: 1.0000 | INHALATION_SPRAY | Freq: Every morning | RESPIRATORY_TRACT | 3 refills | Status: DC

## 2023-04-16 MED ORDER — COLESTIPOL HCL 1 G PO TABS: 2.0000 g | ORAL_TABLET | Freq: Every day | ORAL | 1 refills | Status: DC

## 2023-04-16 NOTE — Patient Instructions (Addendum)
1. Chronic nonseasonal allergic rhinitis - Continue with fluticasone two sprays per nostril once daily. - Increase too TWICE DAILY when you feel that your sinuses are acting up.  - Continue with cetirizine (Zyrtec) 10mg  daily.    2. Moderate persistent asthma, uncomplicated - Lung function looks AWESOME today.  - Your Betty Golden readings are looking great as well.  - We are not going to make any changes at this time.  - We can try sending a 90-day supply of the Trelegy.  - Daily controller medication(s):  Fasenra every 8 weeks  + Trelegy one puff once daily  - Prior to physical activity: albuterol 2 puffs 10-15 minutes before physical activity. - Rescue medications: albuterol 4 puffs every 4-6 hours as needed - Asthma control goals:  * Full participation in all desired activities (may need albuterol before activity) * Albuterol use two time or less a week on average (not counting use with activity) * Cough interfering with sleep two time or less a month * Oral steroids no more than once a year * No hospitalizations  3. Return in about 6 months (around 10/14/2023).    Please inform us of any Emergency Department visits, hospitalizations, or changes in symptoms. Call us before going to the ED for breathing or allergy symptoms since we might be able to fit you in for a sick visit. Feel free to contact us anytime with any questions, problems, or concerns.  It was a pleasure to see you and your family again today!  Websites that have reliable patient information: 1. American Academy of Asthma, Allergy, and Immunology: www.aaaai.org 2. Food Allergy Research and Education (FARE): foodallergy.org 3. Mothers of Asthmatics: http://www.asthmacommunitynetwork.org 4. American College of Allergy, Asthma, and Immunology: www.acaai.org   COVID-19 Vaccine Information can be found at: [email or web address].[email or web address] For questions related to vaccine distribution  or appointments, please email [email or web address] or call [phone].   We realize that you might be concerned about having an allergic reaction to the COVID19 vaccines. To help with that concern, WE ARE OFFERING THE COVID19 VACCINES IN OUR OFFICE! Ask the front desk for dates!     "Like" [place] on Facebook and Instagram for our latest updates!      A healthy democracy works best when [employer] participate! Make sure you are registered to vote! If you have moved or changed any of your contact information, you will need to get this updated before voting!  In some cases, you MAY be able to register to vote online: [email or web address]

## 2023-04-16 NOTE — Progress Notes (Signed)
FOLLOW UP  Date of Service/Encounter:  04/16/23   Assessment:   Severe persistent asthma without complication   Chronic rhinitis (last testing performed in [place])   Recurrent infections - with normal workup in the past (May 2020)   Rheumatoid arthritis - recently transitioned to Enbrel (followed by Dr. Trinna Post)    Fibromyalgia   History of stroke (September 2020)   Normocytic anemia  Plan/Recommendations:   1. Chronic nonseasonal allergic rhinitis - Continue with fluticasone two sprays per nostril once daily. - Increase too TWICE DAILY when you feel that your sinuses are acting up.  - Continue with cetirizine (Zyrtec) 10mg  daily.    2. Moderate persistent asthma, uncomplicated - Lung function looks AWESOME today.  - Your Aluna readings are looking great as well.  - We are not going to make any changes at this time.  - We can try sending a 90-day supply of the Trelegy.  - Daily controller medication(s):  Fasenra every 8 weeks  + Trelegy one puff once daily  - Prior to physical activity: albuterol 2 puffs 10-15 minutes before physical activity. - Rescue medications: albuterol 4 puffs every 4-6 hours as needed - Asthma control goals:  * Full participation in all desired activities (may need albuterol before activity) * Albuterol use two time or less a week on average (not counting use with activity) * Cough interfering with sleep two time or less a month * Oral steroids no more than once a year * No hospitalizations  3. Return in about 6 months (around 10/14/2023).    Subjective:   Betty Golden is a 77 y.o. female presenting today for follow up of  Chief Complaint  Patient presents with   Follow-up    Betty Golden has a history of the following: Patient Active Problem List   Diagnosis Date Noted   Fecal incontinence 02/06/2023   Malnutrition of moderate degree 09/27/2021   Acute respiratory failure with hypoxia (HCC)    Acute urinary  retention 09/26/2021   Aspiration pneumonia (HCC) 09/26/2021   RA (rheumatoid arthritis) (HCC) 09/25/2021   Leukocytosis 09/25/2021   Viral pneumonia 04/20/2019   TIA (transient ischemic attack) 04/16/2019   Dyspnea 04/04/2019   COVID-19 virus infection 04/04/2019   Gastritis and gastroduodenitis 07/08/2018   Rectal bleeding 06/29/2018   Fibromyalgia 09/08/2017   Spinal stenosis of lumbar region 09/20/2015   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/03/2015   Moderate persistent asthma 07/03/2015   Allergic rhinitis 07/03/2015   Colitis due to Clostridium difficile 02/10/2015   Abdominal pain 02/10/2015   Nausea and vomiting 02/10/2015   AKI (acute kidney injury) (HCC) 02/10/2015   Dehydration 02/10/2015   Hypoxia 02/10/2015   Hyponatremia 02/10/2015   Hypokalemia 02/10/2015   COPD (chronic obstructive pulmonary disease) (HCC)    Unspecified constipation 07/15/2013   DYSPNEA 04/05/2008   Essential hypertension 03/10/2008   Headache(784.0) 03/10/2008   Cough 03/10/2008    History obtained from: chart review and patient.  Betty Golden is a 77 y.o. female presenting for a follow up visit.  We last saw her in March 2024.  At that time, we continue with Flonase 2 sprays per nostril once daily and increase this to twice daily when she feels that her sinuses are acting up.  We continued with cetirizine 10 mg daily.  We did amoxicillin twice daily for 10 days and a prednisone taper.  For her asthma, her lung function looked awesome.  We continued her on Fasenra and Trelegy as well as Singulair.  We did ask her to stop the Singulair once she was out of it to make her regimen a bit easier.   Since last visit, she has done well.   Asthma/Respiratory Symptom History: Breathing is well controlled. She is using her Aluna, although on days with fibromyalgia flares, she has difficultly doing it. She got her Harrington Challenger and this is working well. She has not been to the hospital for her breathing.   She uses her albuterol  infrequently - but it has been more routine lately. She did go to [facility] in June 2024 and was diagnosed with hypokalemia. She did prednisone anyway. She remains on Trelegy. She did stop her Singulair at the last visit.     Allergic Rhinitis Symptom History: She is using cetirizine daily. This is working very well to control her symptoms. She is having some loss of her voice. She does use her nose spray as directed.   She was recently diagnosed with skin cancer. She sees Dr. Orvan Falconer in [place]. She has one lesion on her arm. She has a skin infection in her groin.  She is on antibiotics for this. Apparently her skin intermittently bleeds and has some discharge. The antibiotics seem to be helping.   Otherwise, there have been no changes to her past medical history, surgical history, family history, or social history.    Review of systems otherwise negative other than that mentioned in the HPI.    Objective:   Blood pressure 120/76, pulse 98, temperature 98.3 F (36.8 C), resp. rate 16, weight 143 lb 4 oz (65 kg), SpO2 95%. Body mass index is 25.78 kg/m.    Physical Exam Vitals reviewed.  Constitutional:      Appearance: She is well-developed and normal weight.     Comments: Talkative.  She is not using a walker at all.  HENT:     Head: Normocephalic and atraumatic.     Right Ear: Tympanic membrane, ear canal and external ear normal. No drainage, swelling or tenderness. Tympanic membrane is not injected, scarred, erythematous, retracted or bulging.     Left Ear: Tympanic membrane, ear canal and external ear normal. No drainage, swelling or tenderness. Tympanic membrane is not injected, scarred, erythematous, retracted or bulging.     Nose: No nasal deformity, septal deviation, mucosal edema or rhinorrhea.     Right Turbinates: Enlarged, swollen and pale.     Left Turbinates: Enlarged, swollen and pale.     Right Sinus: No maxillary sinus tenderness or frontal sinus tenderness.      Left Sinus: No maxillary sinus tenderness or frontal sinus tenderness.     Comments: Dried rhinorrhea bilaterally.     Mouth/Throat:     Mouth: Mucous membranes are not pale and not dry.     Pharynx: Uvula midline.  Eyes:     General: Lids are normal. No allergic shiner.       Right eye: No discharge.        Left eye: No discharge.     Conjunctiva/sclera: Conjunctivae normal.     Right eye: Right conjunctiva is not injected. No chemosis.    Left eye: Left conjunctiva is not injected. No chemosis.    Pupils: Pupils are equal, round, and reactive to light.  Cardiovascular:     Rate and Rhythm: Normal rate and regular rhythm.     Heart sounds: Normal heart sounds.  Pulmonary:     Effort: Pulmonary effort is normal. No tachypnea, accessory muscle usage or respiratory distress.  Breath sounds: Normal breath sounds. No wheezing, rhonchi or rales.     Comments: Moving air well in all lung fields.  No increased work of breathing. Chest:     Chest wall: No tenderness.  Abdominal:     Tenderness: There is no abdominal tenderness. There is no guarding or rebound.  Musculoskeletal:     Right hand: Swelling and deformity present.     Left hand: Swelling and deformity present.  Lymphadenopathy:     Head:     Right side of head: No submandibular, tonsillar or occipital adenopathy.     Left side of head: No submandibular, tonsillar or occipital adenopathy.     Cervical: No cervical adenopathy.  Skin:    General: Skin is warm.     Capillary Refill: Capillary refill takes less than 2 seconds.     Coloration: Skin is not pale.     Findings: No abrasion, erythema, petechiae or rash. Rash is not papular, urticarial or vesicular.     Comments: No eczematous or urticarial lesions noted.   Neurological:     Mental Status: She is alert.  Psychiatric:        Behavior: Behavior is cooperative.      Diagnostic studies:    Spirometry: results normal (FEV1: 1.65/92%, FVC: 2.02/88%, FEV1/FVC:  82%).    Spirometry consistent with normal pattern.   Allergy Studies: none       [name], MD  [facility]

## 2023-04-16 NOTE — Telephone Encounter (Signed)
I received the results of the most recent anorectal manometry which showed presence of hypotensive internal anal sphincter with external anal sphincter able to generate but unable to maintain a squeeze.  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  I advised the patient to undergo pelvic floor therapy to improve her incontinence.  I also sent a prescription for colestipol 2 g every day.  Hi Ann, can you please refer the patient to pelvic floor therapy at [facility]?  Diagnosis is dyssynergic defecation  Thanks,   Katrinka Blazing, MD Gastroenterology and Hepatology [facility] Gastroenterology

## 2023-04-17 NOTE — Telephone Encounter (Signed)
Referral sent, they will contact patient with apt

## 2023-04-21 ENCOUNTER — Other Ambulatory Visit (INDEPENDENT_AMBULATORY_CARE_PROVIDER_SITE_OTHER): Payer: Self-pay | Admitting: Gastroenterology

## 2023-04-21 ENCOUNTER — Telehealth (INDEPENDENT_AMBULATORY_CARE_PROVIDER_SITE_OTHER): Payer: Self-pay | Admitting: *Deleted

## 2023-04-21 DIAGNOSIS — R112 Nausea with vomiting, unspecified: Secondary | ICD-10-CM

## 2023-04-21 MED ORDER — ONDANSETRON HCL 4 MG PO TABS
4.0000 mg | ORAL_TABLET | Freq: Three times a day (TID) | ORAL | 0 refills | Status: DC | PRN
Start: 2023-04-21 — End: 2024-04-12

## 2023-04-21 NOTE — Telephone Encounter (Signed)
Discussed with patient per Dr. Levon Hedger -  to stop colestipol. I sent Zofran to her pharmacy, she can try taking this as needed for now. Should continue with Benefiber

## 2023-04-21 NOTE — Telephone Encounter (Signed)
Please ask her to stop colestipol. I sent Zofran to her pharmacy, she can try taking this as needed for now. Should continue with Benefiber Thanks

## 2023-04-21 NOTE — Telephone Encounter (Signed)
Pt's daughter nichole maxwell called to report since taking colestipol she has been having nausea and vomiting. She states she does not think its helped much with diarrhea but then said she is having some improvement in diarrhea since taking benefiber. She is taking 2 tablespoons daily.   [facility] scales.   Pt's daughter # [phone]

## 2023-04-22 ENCOUNTER — Ambulatory Visit (HOSPITAL_BASED_OUTPATIENT_CLINIC_OR_DEPARTMENT_OTHER): Payer: Medicare Other | Admitting: Physical Therapy

## 2023-04-28 DIAGNOSIS — J4550 Severe persistent asthma, uncomplicated: Secondary | ICD-10-CM | POA: Diagnosis not present

## 2023-05-20 ENCOUNTER — Ambulatory Visit (INDEPENDENT_AMBULATORY_CARE_PROVIDER_SITE_OTHER): Payer: Medicare Other | Admitting: Gastroenterology

## 2023-05-20 ENCOUNTER — Encounter (INDEPENDENT_AMBULATORY_CARE_PROVIDER_SITE_OTHER): Payer: Self-pay | Admitting: Gastroenterology

## 2023-05-20 VITALS — BP 108/71 | HR 79 | Temp 97.8°F | Ht 62.5 in | Wt 144.4 lb

## 2023-05-20 DIAGNOSIS — R151 Fecal smearing: Secondary | ICD-10-CM | POA: Diagnosis not present

## 2023-05-20 DIAGNOSIS — M9905 Segmental and somatic dysfunction of pelvic region: Secondary | ICD-10-CM

## 2023-05-20 DIAGNOSIS — K5902 Outlet dysfunction constipation: Secondary | ICD-10-CM

## 2023-05-20 NOTE — Progress Notes (Addendum)
Referring Provider: Exie Parody, MD Primary Care Physician:  Exie Parody, MD Primary GI Physician: Dr. Levon Hedger   Chief Complaint  Patient presents with   Follow-up    Patient here today for a follow up on. Patient says she has no issues today, she says the benefiber has helped with making her stools solid.    HPI:   Betty Golden is a 77 y.o. female with past medical history of asthma, C. difficile colitis, depression, diverticulosis, fibromyalgia, GERD, hyperlipidemia, FBM, IBS, rheumatoid arthritis   Patient presenting today for follow up of fecal soiling  Last seen July 2024, at that time, having soiling 4-6 times per day x3 years. Stools watery to soft in consistency. She sometimes wakes up in the morning with a stool. Having nausea and vomiting. Some occasional mid abdominal pain that will last 3-4 days.   Recommended start benefiber 1T daily, can increase to up to 3T per day. Consider colestipol if diarrhea worsens. Referral for anorectal manometry.   Anorectal manometry showed presence of hypotensive internal anal sphincter with external anal sphincter able to generate but unable to maintain a squeeze. 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   Referred to pelvic floor therapy and started on colestipol, having nausea and vomiting with colestipol so advised to stop.   Present:  She is feeling much better on benefiber. She is doing 3T per day. She notes that she is no longer having fecal soiling or accidents. Having 1 solid BM per day, usually in the morning. No abdominal pains. Appetite is improving. She had lost some weight due to an illness but this is improving. No nausea or vomiting.  No rectal bleeding or melena.   She has an upcoming appt with pelvic floor physical therapy on Thursday.   Last EGD:2020 performed by Dr. Karilyn Cota for epigastric pain Normal esophagus, healed ulcer in  the posterior wall of the gastric antrum, congestion in the antrum, normal duodenum. Last Colonoscopy:2020  performed by Dr. Karilyn Cota for rectal bleeding Diverticulosis, external hemorrhoids   Past Medical History:  Diagnosis Date   Asthma    C. difficile colitis    Cervical stenosis of spine    Chronic headaches    Chronic neck pain    Coronary artery disease    Depression    Diverticulosis    Essential hypertension    Fibromyalgia    GERD (gastroesophageal reflux disease)    History of Salmonella gastroenteritis    HNP (herniated nucleus pulposus), lumbar    Hyperlipidemia    IBS (irritable bowel syndrome)    Lung nodule    rheumatoid arthritis     Past Surgical History:  Procedure Laterality Date   APPENDECTOMY  1965   BIOPSY  08/26/2018   Procedure: BIOPSY;  Surgeon: Malissa Hippo, MD;  Location: [facility];  Service: Endoscopy;;  gastric   BREAST BIOPSY     BREAST REDUCTION SURGERY  1994   CHOLECYSTECTOMY  1975   COLONOSCOPY N/A 08/19/2013   Procedure: COLONOSCOPY;  Surgeon: Malissa Hippo, MD;  Location: [facility];  Service: Endoscopy;  Laterality: N/A;  155-moved to 140 Ann to notify pt   COLONOSCOPY N/A 08/26/2018   Procedure: COLONOSCOPY;  Surgeon: Malissa Hippo, MD;  Location: [facility];  Service: Endoscopy;  Laterality: N/A;   ESOPHAGOGASTRODUODENOSCOPY N/A 08/26/2018   Procedure: ESOPHAGOGASTRODUODENOSCOPY (EGD);  Surgeon: Malissa Hippo, MD;  Location: [facility];  Service: Endoscopy;  Laterality: N/A;   fibromyalgia     LUMBAR LAMINECTOMY/DECOMPRESSION MICRODISCECTOMY Bilateral 09/20/2015   Procedure: MICRO LUMBAR DECOMPRESSION L5-S1 BILATERAL    (1 LEVEL);  Surgeon: [name], MD;  Location: [facility];  Service: Orthopedics;  Laterality: Bilateral;   rheumatoid arthritis     Sinus Surgery     TONSILLECTOMY  1968    Current Outpatient Medications  Medication Sig Dispense Refill   albuterol (PROAIR HFA) 108 (90 Base) MCG/ACT inhaler  Inhale 2 puffs into the lungs every 4 (four) hours as needed for wheezing or shortness of breath. 18 g 3   amLODipine (NORVASC) 5 MG tablet Take 1 tablet (5 mg total) by mouth daily. 30 tablet 1   atorvastatin (LIPITOR) 10 MG tablet Take 10 mg by mouth daily.     Benralizumab (FASENRA) 30 MG/ML SOSY Inject 1 mL (30 mg total) into the skin every 8 (eight) weeks. (Patient not taking: Reported on 05/20/2023) 1 mL 6   CALCIUM PO Take 1,250 mg by mouth.     Cetirizine HCl (ZYRTEC PO) Take by mouth.     Cholecalciferol (VITAMIN D-3 PO) Take 400 mg by mouth.     colestipol (COLESTID) 1 g tablet Take 2 tablets (2 g total) by mouth daily. Take  4 hours apart from other medicines 180 tablet 1   doxycycline (VIBRA-TABS) 100 MG tablet Take 100 mg by mouth 2 (two) times daily.     DULoxetine (CYMBALTA) 30 MG capsule Take 30 mg by mouth 2 (two) times daily.     Ferrous Sulfate (IRON PO) Take 65 mg by mouth daily.     fluticasone (FLONASE) 50 MCG/ACT nasal spray SPRAY 2 SPRAYS INTO EACH NOSTRIL EVERY DAY 48 mL 1   Fluticasone-Umeclidin-Vilant (TRELEGY ELLIPTA) 200-62.5-25 MCG/ACT AEPB Inhale 1 puff into the lungs in the morning. 90 each 3   hydrOXYzine (ATARAX) 25 MG tablet Take 1 tablet (25 mg total) by mouth every 8 (eight) hours as needed for anxiety. 12 tablet 0   leflunomide (ARAVA) 10 MG tablet Take 10 mg by mouth daily. (Patient not taking: Reported on 04/16/2023)     losartan-hydrochlorothiazide (HYZAAR) 100-12.5 MG tablet Take 1 tablet by mouth daily.     MAGNESIUM PO Take 400 mg by mouth.     montelukast (SINGULAIR) 10 MG tablet Take by mouth. (Patient not taking: Reported on 04/16/2023)     Multiple Vitamin (MULTIVITAMIN PO) Take by mouth.     ondansetron (ZOFRAN) 4 MG tablet Take 1 tablet (4 mg total) by mouth every 8 (eight) hours as needed for nausea or vomiting. 30 tablet 0   pantoprazole (PROTONIX) 40 MG tablet Take 1 tablet (40 mg total) by mouth daily. (Patient not taking: Reported on 04/16/2023)  90 tablet 1   potassium chloride SA (KLOR-CON M) 20 MEQ tablet Take 1 tablet (20 mEq total) by mouth daily. 6 tablet 0   pregabalin (LYRICA) 50 MG capsule Take 50 mg by mouth 2 (two) times daily.     RESTASIS 0.05 % ophthalmic emulsion 1 drop 2 (two) times daily.     simvastatin (ZOCOR) 20 MG tablet Take 20 mg by mouth at bedtime.      vitamin B-12 (CYANOCOBALAMIN) 1000 MCG tablet Take 1 tablet (1,000 mcg total) by mouth daily. 30 tablet 3   Current Facility-Administered Medications  Medication Dose Route Frequency Provider Last Rate Last Admin   Benralizumab SOSY 30 mg  30 mg Subcutaneous Q8 Thomes Dinning, MD  30 mg at 04/16/23 1022    Allergies as of 05/20/2023 - Review Complete 05/20/2023  Allergen Reaction Noted   Hydromorphone Anaphylaxis 10/09/2021   Sulfa antibiotics Rash 02/10/2015    Family History  Problem Relation Age of Onset   Heart disease Mother    Hypertension Mother    Asthma Mother    Heart disease Father    Stroke Father    Hypertension Father    Stroke Sister    Asthma Sister    Hypertension Brother    Asthma Brother    Asthma Maternal Grandmother    Heart disease Maternal Grandmother    Diabetes Maternal Grandfather    Asthma Maternal Aunt    Colon cancer Neg Hx     Social History   Socioeconomic History   Marital status: Single    Spouse name: Not on file   Number of children: 1   Years of education: Not on file   Highest education level: GED or equivalent  Occupational History   Not on file  Tobacco Use   Smoking status: Former    Current packs/day: 0.00    Average packs/day: 2.0 packs/day for 10.0 years (20.0 ttl pk-yrs)    Types: Cigarettes    Start date: 09/11/1978    Quit date: 09/11/1988    Years since quitting: 34.7   Smokeless tobacco: Never   Tobacco comments:    quit 1990  Vaping Use   Vaping status: Never Used  Substance and Sexual Activity   Alcohol use: No    Comment: stopped in 1990   Drug use: No     Comment: hx smoking marijuana and cocaine weekends. Stopped in the 1990s.   Sexual activity: Not on file  Other Topics Concern   Not on file  Social History Narrative   Lives at home with her daughter & daughter's family (they live with her)   Right handed   Drinks no caffeine   Social Determinants of Health   Financial Resource Strain: Low Risk  (04/04/2019)   Overall Financial Resource Strain (CARDIA)    Difficulty of Paying Living Expenses: Not hard at all  Food Insecurity: No Food Insecurity (04/04/2019)   Hunger Vital Sign    Worried About Running Out of Food in the Last Year: Never true    Ran Out of Food in the Last Year: Never true  Transportation Needs: No Transportation Needs (04/04/2019)   PRAPARE - Administrator, Civil Service (Medical): No    Lack of Transportation (Non-Medical): No  Physical Activity: Inactive (04/04/2019)   Exercise Vital Sign    Days of Exercise per Week: 0 days    Minutes of Exercise per Session: 0 min  Stress: No Stress Concern Present (04/04/2019)   [employer] of Occupational Health - Occupational Stress Questionnaire    Feeling of Stress : Only a little  Social Connections: Moderately Integrated (04/04/2019)   Social Connection and Isolation Panel [NHANES]    Frequency of Communication with Friends and Family: More than three times a week    Frequency of Social Gatherings with Friends and Family: Three times a week    Attends Religious Services: More than 4 times per year    Active Member of Clubs or Organizations: Yes    Attends Banker Meetings: More than 4 times per year    Marital Status: Never married    Review of systems General: negative for malaise, night sweats, fever, chills, weight loss Neck: Negative for  lumps, goiter, pain and significant neck swelling Resp: Negative for cough, wheezing, dyspnea at rest CV: Negative for chest pain, leg swelling, palpitations, orthopnea GI: denies melena, hematochezia,  nausea, vomiting, diarrhea, constipation, dysphagia, odyonophagia, early satiety or unintentional weight loss.  The remainder of the review of systems is noncontributory.  Physical Exam: BP 99/66 (BP Location: Left Arm, Patient Position: Sitting, Cuff Size: Normal)   Pulse 84   Temp 97.8 F (36.6 C) (Temporal)   Ht 5' 2.5" (1.588 m)   Wt 144 lb 6.4 oz (65.5 kg)   BMI 25.99 kg/m  General:   Alert and oriented. No distress noted. Pleasant and cooperative.  Head:  Normocephalic and atraumatic. Eyes:  Conjuctiva clear without scleral icterus. Mouth:  Oral mucosa pink and moist. Good dentition. No lesions. Heart: Normal rate and rhythm, s1 and s2 heart sounds present.  Lungs: Clear lung sounds in all lobes. Respirations equal and unlabored. Abdomen:  +BS, soft, non-tender and non-distended. No rebound or guarding. No HSM or masses noted. Neurologic:  Alert and  oriented x4 Psych:  Alert and cooperative. Normal mood and affect.  Invalid input(s): "6 MONTHS"   ASSESSMENT: Betty Golden is a 77 y.o. 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 No further loose stools, fecal soiling or accidents. She is feeling much better. Has appt with pelvic floor physical therapy on Thursday. She inquired if she needed to keep this given she is no longer having symptoms. I recommended she atleast keep consultation appt with them to see if they have any further recommendations in case she has further issues in the future. For now will continue with benefiber.    PLAN:  Continue with benefiber 3T daily 2. Keep appt with pelvic floor physical therapy on Thursday   3. Pt to make [place] aware of new/worsening GI issues   All questions were answered, patient verbalized understanding and is in agreement with plan as outlined above.    Follow Up: PRN   Meleana Commerford L. Jeanmarie Hubert, MSN, APRN, AGNP-C Adult-Gerontology Nurse  Practitioner [facility]  I have reviewed the note and agree with the APP's assessment as described in this progress note  Katrinka Blazing, MD Gastroenterology and Hepatology [facility] Gastroenterology

## 2023-05-20 NOTE — Patient Instructions (Signed)
Please continue with benefiber 1T three times per day as you are doing Keep appt with pelvic floor physical therapist We will plan to see you on as needed basis, please let me know if you have any new or worsening GI issues  It was a pleasure to see you today. I want to create trusting relationships with patients and provide genuine, compassionate, and quality care. I truly value your feedback! please be on the lookout for a survey regarding your visit with me today. I appreciate your input about our visit and your time in completing this!    Sherrod Toothman L. Jeanmarie Hubert, MSN, APRN, AGNP-C Adult-Gerontology Nurse Practitioner [facility]

## 2023-05-21 NOTE — Therapy (Signed)
OUTPATIENT PHYSICAL THERAPY FEMALE PELVIC EVALUATION   Patient Name: Betty Golden MRN: [id number] DOB:[birth year], 77 y.o., female Today's Date: 05/22/2023  END OF SESSION:  PT End of Session - 05/22/23 1016     Visit Number 1    PT Start Time 0930    PT Stop Time 1015    PT Time Calculation (min) 45 min    Activity Tolerance Patient tolerated treatment well    Behavior During Therapy WFL for tasks assessed/performed             Past Medical History:  Diagnosis Date   Asthma    C. difficile colitis    Cervical stenosis of spine    Chronic headaches    Chronic neck pain    Coronary artery disease    Depression    Diverticulosis    Essential hypertension    Fibromyalgia    GERD (gastroesophageal reflux disease)    History of Salmonella gastroenteritis    HNP (herniated nucleus pulposus), lumbar    Hyperlipidemia    IBS (irritable bowel syndrome)    Lung nodule    rheumatoid arthritis    Past Surgical History:  Procedure Laterality Date   APPENDECTOMY  1965   BIOPSY  08/26/2018   Procedure: BIOPSY;  Surgeon: Malissa Hippo, MD;  Location: [facility];  Service: Endoscopy;;  gastric   BREAST BIOPSY     BREAST REDUCTION SURGERY  1994   CHOLECYSTECTOMY  1975   COLONOSCOPY N/A 08/19/2013   Procedure: COLONOSCOPY;  Surgeon: Malissa Hippo, MD;  Location: [facility];  Service: Endoscopy;  Laterality: N/A;  155-moved to 140 Ann to notify pt   COLONOSCOPY N/A 08/26/2018   Procedure: COLONOSCOPY;  Surgeon: Malissa Hippo, MD;  Location: [facility];  Service: Endoscopy;  Laterality: N/A;   ESOPHAGOGASTRODUODENOSCOPY N/A 08/26/2018   Procedure: ESOPHAGOGASTRODUODENOSCOPY (EGD);  Surgeon: Malissa Hippo, MD;  Location: [facility];  Service: Endoscopy;  Laterality: N/A;   fibromyalgia     LUMBAR LAMINECTOMY/DECOMPRESSION MICRODISCECTOMY Bilateral 09/20/2015   Procedure: MICRO LUMBAR DECOMPRESSION L5-S1 BILATERAL    (1 LEVEL);  Surgeon: [name], MD;   Location: [facility];  Service: Orthopedics;  Laterality: Bilateral;   rheumatoid arthritis     Sinus Surgery     TONSILLECTOMY  1968   Patient Active Problem List   Diagnosis Date Noted   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   Fecal incontinence 02/06/2023   Malnutrition of moderate degree 09/27/2021   Acute respiratory failure with hypoxia (HCC)    Acute urinary retention 09/26/2021   Aspiration pneumonia (HCC) 09/26/2021   RA (rheumatoid arthritis) (HCC) 09/25/2021   Leukocytosis 09/25/2021   Viral pneumonia 04/20/2019   TIA (transient ischemic attack) 04/16/2019   Dyspnea 04/04/2019   COVID-19 virus infection 04/04/2019   Gastritis and gastroduodenitis 07/08/2018   Rectal bleeding 06/29/2018   Fibromyalgia 09/08/2017   Spinal stenosis of lumbar region 09/20/2015   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/03/2015   Moderate persistent asthma 07/03/2015   Allergic rhinitis 07/03/2015   Colitis due to Clostridium difficile 02/10/2015   Abdominal pain 02/10/2015   Nausea and vomiting 02/10/2015   AKI (acute kidney injury) (HCC) 02/10/2015   Dehydration 02/10/2015   Hypoxia 02/10/2015   Hyponatremia 02/10/2015   Hypokalemia 02/10/2015   COPD (chronic obstructive pulmonary disease) (HCC)    Constipation 07/15/2013   DYSPNEA 04/05/2008   Essential hypertension 03/10/2008   Headache 03/10/2008   Cough 03/10/2008    PCP: Exie Parody, MD PCP -  General  REFERRING PROVIDER: Dolores Frame, MD Ref Provider  REFERRING DIAG: [phone] (ICD-10-CM) - Dyssynergic defecation  THERAPY DIAG:  Other lack of coordination  Muscle weakness (generalized)  Rationale for Evaluation and Treatment: Rehabilitation  ONSET DATE: 2020  SUBJECTIVE:                                                                                                                                                                                           SUBJECTIVE STATEMENT: Pt reports that she is doing better now- less  diarrhea, more controlled stools. She is taking bene fiber. She feels ok, she does not feel like she needs PT, she has a lot of appts. Diarrhea was going on for years. 3-4 years, took her a long time to call the doctor. Coffee in the morning helps her go every morning. Daughter reports that pt was on life support earlier this year , she could not move d/t her arthritis. Had liver failure.   Fluid intake: Yes: drinks a lot of water    PAIN:  Are you having pain? Yes NPRS scale: 7/10- all over, mostly legs Pain location:  legs  Pain type: aching Pain description: aching   Aggravating factors: just comes Relieving factors: nothing  PRECAUTIONS: None  RED FLAGS: Bowel or bladder incontinence: No Not lately- since 2 weeks ago  WEIGHT BEARING RESTRICTIONS: No  FALLS:  Has patient fallen in last 6 months? 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  OCCUPATION: retired  PLOF: Independent with household mobility with device  PATIENT GOALS: to not have accidents- per daughter  PERTINENT HISTORY:   Sexual abuse: No  BOWEL MOVEMENT: Pain with bowel movement: No Type of bowel movement:Type (Bristol Stool Scale) 3-4 Fully empty rectum: Yes:   Leakage: No Pads: No Fiber supplement: Yes: bene fiber  URINATION: Pain with urination: No Fully empty bladder: Yes:   Stream: Strong Urgency: no, more with water Frequency: yes Leakage:  no Pads: No  INTERCOURSE:- not sexually active  PREGNANCY:- N/A    OBJECTIVE:  Note: Objective measures were completed at Evaluation unless otherwise noted.     COGNITION: Overall cognitive status: Within functional limits for tasks assessed     SENSATION: Light touch: Appears intact Proprioception: Appears intact  MUSCLE LENGTH: Hamstrings: Right 90 deg; Left 80 deg  LUMBAR SPECIAL TESTS:  FABER test:  Positive    GAIT: Distance walked: 40 feet Assistive device utilized: Quad cane large base Level of assistance: SBA   POSTURE: rounded shoulders, forward head, and decreased lumbar  lordosis  PELVIC ALIGNMENT:  LUMBARAROM/PROM:  A/PROM A/PROM  eval  Flexion Difficult-50%  Extension Difficult 50%  Right lateral flexion   Left lateral flexion   Right rotation   Left rotation    (Blank rows = not tested)  LOWER EXTREMITY ROM:  Passive ROM Right eval Left eval  Hip flexion Limited  Within functional limitations   Hip extension limited limited  Hip abduction    Hip adduction    Hip internal rotation limited Within functional limitations   Hip external rotation limited Within functional limitations   Knee flexion    Knee extension    Ankle dorsiflexion    Ankle plantarflexion    Ankle inversion    Ankle eversion     (Blank rows = not tested)  LOWER EXTREMITY MMT:  MMT Right eval Left eval  Hip flexion 3/5 3/5  Hip extension    Hip abduction 3/5 3/5  Hip adduction    Hip internal rotation    Hip external rotation    Knee flexion    Knee extension    Ankle dorsiflexion    Ankle plantarflexion    Ankle inversion    Ankle eversion     PALPATION:   General  within functional limitations                 External Perineal Exam contraction palpable through clothing                             Internal Pelvic Floor deferred   Patient confirms identification and approves PT to assess internal pelvic floor and treatment Yes  PELVIC MMT:   MMT eval  Vaginal   Internal Anal Sphincter   External Anal Sphincter   Puborectalis   Diastasis Recti   (Blank rows = not tested)        TONE: deferred  PROLAPSE: Deferred   TODAY'S TREATMENT:                                                                                                                              DATE: 05/22/23   EVAL see above   PATIENT EDUCATION:  Education details: abdominal  massage, constipation overview, squatty potty, relevant anatomy Person educated: Patient and Child(ren) Education method: Explanation, Demonstration, Tactile cues, Verbal cues, and Handouts Education comprehension: verbalized understanding and needs further education  HOME EXERCISE PROGRAM: [id number]  ASSESSMENT:  CLINICAL IMPRESSION: Patient is a 77 y.o. F who was seen today for physical therapy evaluation and treatment for fecal incontinence. Pt reported that since she started taking bene fiber 2 weeks ago, she has not had any accidents and has been feeling better. Daughter confirmed. Pt used to be embarrassed when she had accidents at bible study, doctor's appts etc. She is limited in mobility d/t rheumatoid arthritis and fibromyalgia, has weakness, pain and difficulty with transfers and ambulation. Amb with quad cane. She has  weak abdominals, decrease coordination with her pelvic floor and restrictions throughout abdomen. Abdominal scars not painful. Difficulty with bilateral hips and knees PROM, more on right and slow ambulation. Patient drives and reported that she should be more active but does not feel like it, does not know why. She seems to be in significant pain, has decreased ability to stand and fecal accidents used to limit her activity level. Discussed trying to be consistent with HEP and going to the gym now that she is feeling better and does not have accidents.   OBJECTIVE IMPAIRMENTS: cardiopulmonary status limiting activity, decreased activity tolerance, decreased balance, decreased coordination, decreased endurance, decreased knowledge of condition, decreased mobility, difficulty walking, decreased ROM, decreased strength, hypomobility, increased fascial restrictions, and pain.   ACTIVITY LIMITATIONS: carrying, lifting, bending, standing, stairs, transfers, bed mobility, continence, bathing, toileting, reach over head, locomotion level, and caring for others  PARTICIPATION  LIMITATIONS: meal prep, cleaning, laundry, medication management, personal finances, interpersonal relationship, driving, shopping, community activity, occupation, yard work, school, and church  PERSONAL FACTORS: Age, Time since onset of injury/illness/exacerbation, Transportation, and 1-2 comorbidities: fibromyalgia, rheumatoid arthritis  are also affecting patient's functional outcome.   REHAB POTENTIAL: Good  CLINICAL DECISION MAKING: Evolving/moderate complexity  EVALUATION COMPLEXITY: Low   GOALS: Goals reviewed with patient? Yes  SHORT TERM GOALS: Target date: 06/19/2023    Pt will be independent with HEP.   Baseline: Goal status: INITIAL  2.  Pt will report reduced fecal accidents to max 1/week Baseline:  Goal status: INITIAL  3.  Pt will report her BMs are complete at least 75% of the time due to improved bowel habits and evacuation techniques.  Baseline:  Goal status: INITIAL   LONG TERM GOALS: Target date: 07/17/2023    Pt will be independent with advanced HEP.   Baseline:  Goal status: INITIAL  2.  Pt will report 0 accidents/ week Baseline:  Goal status: INITIAL  3.  Pt will be independent with use of squatty potty, relaxed toileting mechanics, and improved bowel movement techniques in order to increase ease of bowel movements and complete evacuation.   Baseline:  Goal status: INITIAL  4.  Pt will participate in senior exercise class at least 2/ week in order to improve mobility and endurance  and reduce pain in bilateral hips and knees.  Baseline:  Goal status: INITIAL  PLAN:  PT FREQUENCY: 1-2x/week  PT DURATION: 8 weeks  PLANNED INTERVENTIONS: 97110-Therapeutic exercises, 97530- Therapeutic activity, 97112- Neuromuscular re-education, 97535- Self Care, [id number]- Manual therapy, Dry Needling, Joint mobilization, Joint manipulation, Spinal manipulation, Spinal mobilization, Scar mobilization, and Biofeedback  PLAN FOR NEXT SESSION: internal pelvic  floor muscle assessment   Akacia Boltz, PT 05/22/2023, 10:29 AM

## 2023-05-22 ENCOUNTER — Other Ambulatory Visit: Payer: Self-pay

## 2023-05-22 ENCOUNTER — Ambulatory Visit: Payer: Medicare Other | Attending: Gastroenterology | Admitting: Physical Therapy

## 2023-05-22 DIAGNOSIS — R278 Other lack of coordination: Secondary | ICD-10-CM | POA: Insufficient documentation

## 2023-05-22 DIAGNOSIS — M6281 Muscle weakness (generalized): Secondary | ICD-10-CM | POA: Insufficient documentation

## 2023-05-22 NOTE — Patient Instructions (Signed)
About Constipation  Constipation Overview Constipation is the most common gastrointestinal complaint -- about 4 million Americans experience constipation and make 2.5 million physician visits a year to get help for the problem.  Constipation can occur when the colon absorbs too much water, the colon's muscle contraction is slow or sluggish, and/or there is delayed transit time through the colon.  The result is stool that is hard and dry.  Indicators of constipation include straining during bowel movements greater than 25% of the time, having fewer than three bowel movements per week, and/or the feeling of incomplete evacuation.  There are established guidelines ([place] ) for defining constipation. A person needs to have two or more of the following symptoms for at least 12 weeks (not necessarily consecutive) in the preceding 12 months: Straining in  greater than 25% of bowel movements Lumpy or hard stools in greater than 25% of bowel movements Sensation of incomplete emptying in greater than 25% of bowel movements Sensation of anorectal obstruction/blockade in greater than 25% of bowel movements Manual maneuvers to help empty greater than 25% of bowel movements (e.g., digital evacuation, support of the pelvic floor)  Less than  3 bowel movements/week Loose stools are not present, and criteria for irritable bowel syndrome are insufficient Common Causes of Constipation lack of fiber in your diet lack of physical activity medications, including iron and calcium supplements  dairy intake dehydration abuse of laxatives travel Irritable Bowel Syndrome pregnancy luteal phase of menstruation (after ovulation and before menses) colorectal problems intestinal dysfunction  Treating Constipation  There are several ways of treating constipation, including changes to diet and exercise, use of laxatives, adjustments to the pelvic floor, and scheduled toileting.  These treatments include: increasing  fiber and fluids in the diet  increasing physical activity learning muscle coordination  learning proper toileting techniques and toileting modifications  designing and sticking  to a toileting schedule

## 2023-05-29 ENCOUNTER — Ambulatory Visit: Payer: Medicare Other | Admitting: Physical Therapy

## 2023-05-29 DIAGNOSIS — J4550 Severe persistent asthma, uncomplicated: Secondary | ICD-10-CM | POA: Diagnosis not present

## 2023-06-04 ENCOUNTER — Ambulatory Visit: Payer: Medicare Other

## 2023-06-04 ENCOUNTER — Encounter (HOSPITAL_COMMUNITY)
Admission: RE | Admit: 2023-06-04 | Discharge: 2023-06-04 | Disposition: A | Discharge status: Home / Self Care | Payer: Medicare Other | Source: Ambulatory Visit | Attending: Ophthalmology | Admitting: Ophthalmology

## 2023-06-04 DIAGNOSIS — J4550 Severe persistent asthma, uncomplicated: Secondary | ICD-10-CM | POA: Diagnosis not present

## 2023-06-05 ENCOUNTER — Ambulatory Visit: Payer: Medicare Other | Attending: Gastroenterology | Admitting: Physical Therapy

## 2023-06-05 DIAGNOSIS — R278 Other lack of coordination: Secondary | ICD-10-CM | POA: Insufficient documentation

## 2023-06-05 DIAGNOSIS — M6281 Muscle weakness (generalized): Secondary | ICD-10-CM | POA: Insufficient documentation

## 2023-06-05 NOTE — Therapy (Signed)
OUTPATIENT PHYSICAL THERAPY FEMALE PELVIC EVALUATION   Patient Name: Betty Golden MRN: [id number] DOB:February 21, 1946, 77 y.o., female Today's Date: 06/05/2023  END OF SESSION:  PT End of Session - 06/05/23 1032     Visit Number 2    PT Start Time 0925    PT Stop Time 1020    PT Time Calculation (min) 55 min    Activity Tolerance Patient tolerated treatment well    Behavior During Therapy WFL for tasks assessed/performed              Past Medical History:  Diagnosis Date   Asthma    C. difficile colitis    Cervical stenosis of spine    Chronic headaches    Chronic neck pain    Coronary artery disease    Depression    Diverticulosis    Essential hypertension    Fibromyalgia    GERD (gastroesophageal reflux disease)    History of Salmonella gastroenteritis    HNP (herniated nucleus pulposus), lumbar    Hyperlipidemia    IBS (irritable bowel syndrome)    Lung nodule    rheumatoid arthritis    Past Surgical History:  Procedure Laterality Date   APPENDECTOMY  1965   BIOPSY  08/26/2018   Procedure: BIOPSY;  Surgeon: Malissa Hippo, MD;  Location: [facility];  Service: Endoscopy;;  gastric   BREAST BIOPSY     BREAST REDUCTION SURGERY  1994   CHOLECYSTECTOMY  1975   COLONOSCOPY N/A 08/19/2013   Procedure: COLONOSCOPY;  Surgeon: Malissa Hippo, MD;  Location: [facility];  Service: Endoscopy;  Laterality: N/A;  155-moved to 140 Ann to notify pt   COLONOSCOPY N/A 08/26/2018   Procedure: COLONOSCOPY;  Surgeon: Malissa Hippo, MD;  Location: [facility];  Service: Endoscopy;  Laterality: N/A;   ESOPHAGOGASTRODUODENOSCOPY N/A 08/26/2018   Procedure: ESOPHAGOGASTRODUODENOSCOPY (EGD);  Surgeon: Malissa Hippo, MD;  Location: [facility];  Service: Endoscopy;  Laterality: N/A;   fibromyalgia     LUMBAR LAMINECTOMY/DECOMPRESSION MICRODISCECTOMY Bilateral 09/20/2015   Procedure: MICRO LUMBAR DECOMPRESSION L5-S1 BILATERAL    (1 LEVEL);  Surgeon: [name], MD;   Location: [facility];  Service: Orthopedics;  Laterality: Bilateral;   rheumatoid arthritis     Sinus Surgery     TONSILLECTOMY  1968   Patient Active Problem List   Diagnosis Date Noted   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   Fecal incontinence 02/06/2023   Malnutrition of moderate degree 09/27/2021   Acute respiratory failure with hypoxia (HCC)    Acute urinary retention 09/26/2021   Aspiration pneumonia (HCC) 09/26/2021   RA (rheumatoid arthritis) (HCC) 09/25/2021   Leukocytosis 09/25/2021   Viral pneumonia 04/20/2019   TIA (transient ischemic attack) 04/16/2019   Dyspnea 04/04/2019   COVID-19 virus infection 04/04/2019   Gastritis and gastroduodenitis 07/08/2018   Rectal bleeding 06/29/2018   Fibromyalgia 09/08/2017   Spinal stenosis of lumbar region 09/20/2015   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/03/2015   Moderate persistent asthma 07/03/2015   Allergic rhinitis 07/03/2015   Colitis due to Clostridium difficile 02/10/2015   Abdominal pain 02/10/2015   Nausea and vomiting 02/10/2015   AKI (acute kidney injury) (HCC) 02/10/2015   Dehydration 02/10/2015   Hypoxia 02/10/2015   Hyponatremia 02/10/2015   Hypokalemia 02/10/2015   COPD (chronic obstructive pulmonary disease) (HCC)    Constipation 07/15/2013   DYSPNEA 04/05/2008   Essential hypertension 03/10/2008   Headache 03/10/2008   Cough 03/10/2008    PCP: Exie Parody, MD  PCP - General  REFERRING PROVIDER: Dolores Frame, MD Ref Provider  REFERRING DIAG: [phone] (ICD-10-CM) - Dyssynergic defecation  THERAPY DIAG:  Other lack of coordination  Muscle weakness (generalized)  Rationale for Evaluation and Treatment: Rehabilitation  ONSET DATE: 2020  SUBJECTIVE:                                                                                                                                                                                           SUBJECTIVE STATEMENT: Pt reports that that benefiber has been helpful.  Pt is not constipated. Bristol stool scale 4. Used to be 5-7. Goes in the morning, sometimes in the afternoon. Daughter reports that mom is doing better, they travelled Tuesday night and she was able to control it. No accidents recently. Pt with multiple medical issues. Pt reports that she used to exercise a lot and loved it, she needs to get back to it.      Pt reports that she is doing better now- less diarrhea, more controlled stools. She is taking bene fiber. She feels ok, she does not feel like she needs PT, she has a lot of appts. Diarrhea was going on for years. 3-4 years, took her a long time to call the doctor. Coffee in the morning helps her go every morning. Daughter reports that pt was on life support earlier this year , she could not move d/t her arthritis. Had liver failure.   Fluid intake: Yes: drinks a lot of water    PAIN:  Are you having pain? Yes NPRS scale: 7/10- all over, mostly legs Pain location:  legs  Pain type: aching Pain description: aching   Aggravating factors: just comes Relieving factors: nothing  PRECAUTIONS: None  RED FLAGS: Bowel or bladder incontinence: No Not lately- since 2 weeks ago  WEIGHT BEARING RESTRICTIONS: No  FALLS:  Has patient fallen in last 6 months? 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  OCCUPATION: retired  PLOF: Independent with household mobility with device  PATIENT GOALS: to not have accidents- per daughter  PERTINENT HISTORY:   Sexual abuse: No  BOWEL MOVEMENT: Pain with bowel movement: No Type of bowel movement:Type (Bristol Stool Scale) 3-4 Fully empty rectum: Yes:   Leakage: No Pads: No Fiber supplement: Yes: bene fiber  URINATION: Pain with urination: No Fully empty bladder: Yes:   Stream: Strong Urgency: no, more with water Frequency: yes Leakage:  no Pads: No  INTERCOURSE:- not  sexually active  PREGNANCY:- N/A    OBJECTIVE:  Note: Objective measures  were completed at Evaluation unless otherwise noted.     COGNITION: Overall cognitive status: Within functional limits for tasks assessed     SENSATION: Light touch: Appears intact Proprioception: Appears intact  MUSCLE LENGTH: Hamstrings: Right 90 deg; Left 80 deg  LUMBAR SPECIAL TESTS:  FABER test: Positive    GAIT: Distance walked: 40 feet Assistive device utilized: Quad cane large base Level of assistance: SBA   POSTURE: rounded shoulders, forward head, and decreased lumbar lordosis  PELVIC ALIGNMENT:  LUMBARAROM/PROM:  A/PROM A/PROM  eval  Flexion Difficult-50%  Extension Difficult 50%  Right lateral flexion   Left lateral flexion   Right rotation   Left rotation    (Blank rows = not tested)  LOWER EXTREMITY ROM:  Passive ROM Right eval Left eval  Hip flexion Limited  Within functional limitations   Hip extension limited limited  Hip abduction    Hip adduction    Hip internal rotation limited Within functional limitations   Hip external rotation limited Within functional limitations   Knee flexion    Knee extension    Ankle dorsiflexion    Ankle plantarflexion    Ankle inversion    Ankle eversion     (Blank rows = not tested)  LOWER EXTREMITY MMT:  MMT Right eval Left eval  Hip flexion 3/5 3/5  Hip extension    Hip abduction 3/5 3/5  Hip adduction    Hip internal rotation    Hip external rotation    Knee flexion    Knee extension    Ankle dorsiflexion    Ankle plantarflexion    Ankle inversion    Ankle eversion     PALPATION:   General  within functional limitations                 External Perineal Exam contraction palpable through clothing                             Internal Pelvic Floor deferred   Patient confirms identification and approves PT to assess internal pelvic floor and treatment Yes  PELVIC MMT:   MMT eval  Vaginal 4/5   Internal Anal Sphincter 3/5  External Anal Sphincter 3/5  Puborectalis   Diastasis Recti Yes- 2 fingers above umbilicus  (Blank rows = not tested)        TONE: deferred  PROLAPSE: Deferred   TODAY'S TREATMENT:                                                                                                                              DATE: 06/05/23   Manual- pelvic floor muscle assessment    Neuro reed- pelvic floor contraction, relaxation, bulging reed vaginally and rectally with TRA breath, bridging and rowing with TRANSVERSE ABDOMINIS BREATH, seated rows, seated TRANSVERSE ABDOMINIS BREATH with horizontal abduction with theraband     PATIENT EDUCATION:  Education details: abdominal massage,  constipation overview, squatty potty, relevant anatomy Person educated: Patient and Child(ren) Education method: Explanation, Demonstration, Tactile cues, Verbal cues, and Handouts Education comprehension: verbalized understanding and needs further education  HOME EXERCISE PROGRAM: [id number]-  ASSESSMENT:  CLINICAL IMPRESSION: 11/7 Pt with some rectal pelvic floor weakness, able to contract, relax, bilge, however overuses her abdomen and contracts on inhale. Reported no urinary incontinence and vaginally pelvic floor muscles are strong. Daughter present. Pt with abdominal  diastasis, bacterial infection sores throughout her groins, one open sore, Awaiting skin surgery and cataract surgery.  Decreased balance present  and difficulty with transfers. She will benefit from cont PT. Discussed consistency with HEP, did fairly well with new exercises.  Patient is a 77 y.o. F who was seen today for physical therapy evaluation and treatment for fecal incontinence. Pt reported that since she started taking bene fiber 2 weeks ago, she has not had any accidents and has been feeling better. Daughter confirmed. Pt used to be embarrassed when she had accidents at bible study, doctor's appts etc. She is  limited in mobility d/t rheumatoid arthritis and fibromyalgia, has weakness, pain and difficulty with transfers and ambulation. Amb with quad cane. She has weak abdominals, decrease coordination with her pelvic floor and restrictions throughout abdomen. Abdominal scars not painful. Difficulty with bilateral hips and knees PROM, more on right and slow ambulation. Patient drives and reported that she should be more active but does not feel like it, does not know why. She seems to be in significant pain, has decreased ability to stand and fecal accidents used to limit her activity level. Discussed trying to be consistent with HEP and going to the gym now that she is feeling better and does not have accidents.   OBJECTIVE IMPAIRMENTS: cardiopulmonary status limiting activity, decreased activity tolerance, decreased balance, decreased coordination, decreased endurance, decreased knowledge of condition, decreased mobility, difficulty walking, decreased ROM, decreased strength, hypomobility, increased fascial restrictions, and pain.   ACTIVITY LIMITATIONS: carrying, lifting, bending, standing, stairs, transfers, bed mobility, continence, bathing, toileting, reach over head, locomotion level, and caring for others  PARTICIPATION LIMITATIONS: meal prep, cleaning, laundry, medication management, personal finances, interpersonal relationship, driving, shopping, community activity, occupation, yard work, school, and church  PERSONAL FACTORS: Age, Time since onset of injury/illness/exacerbation, Transportation, and 1-2 comorbidities: fibromyalgia, rheumatoid arthritis  are also affecting patient's functional outcome.   REHAB POTENTIAL: Good  CLINICAL DECISION MAKING: Evolving/moderate complexity  EVALUATION COMPLEXITY: Low   GOALS: Goals reviewed with patient? Yes  SHORT TERM GOALS: Target date: 06/19/2023    Pt will be independent with HEP.   Baseline: Goal status: progressing  2.  Pt will report  reduced fecal accidents to max 1/week Baseline:  Goal status: met  3.  Pt will report her BMs are complete at least 75% of the time due to improved bowel habits and evacuation techniques.  Baseline:  Goal status: INITIAL   LONG TERM GOALS: Target date: 07/17/2023    Pt will be independent with advanced HEP.   Baseline:  Goal status: INITIAL  2.  Pt will report 0 accidents/ week Baseline:  Goal status: INITIAL  3.  Pt will be independent with use of squatty potty, relaxed toileting mechanics, and improved bowel movement techniques in order to increase ease of bowel movements and complete evacuation.   Baseline:  Goal status: INITIAL  4.  Pt will participate in senior exercise class at least 2/ week in order to improve mobility and endurance  and reduce pain in bilateral hips  and knees.  Baseline:  Goal status: INITIAL  PLAN:  PT FREQUENCY: 1-2x/week  PT DURATION: 8 weeks  PLANNED INTERVENTIONS: 97110-Therapeutic exercises, 97530- Therapeutic activity, 97112- Neuromuscular re-education, 97535- Self Care, 29562- Manual therapy, Dry Needling, Joint mobilization, Joint manipulation, Spinal manipulation, Spinal mobilization, Scar mobilization, and Biofeedback  PLAN FOR NEXT SESSION: internal pelvic floor muscle assessment   Celestine Prim, PT 06/05/2023, 10:35 AM

## 2023-06-06 ENCOUNTER — Ambulatory Visit: Payer: Medicare Other

## 2023-06-06 NOTE — H&P (Signed)
Surgical History & Physical  Patient Name: Betty Golden  DOB: 21-Oct-1945  Surgery: Cataract extraction with intraocular lens implant phacoemulsification; Left Eye Surgeon: [name] MD Surgery Date: 06/09/2023 Pre-Op Date: 05/19/2023  HPI: A 87 Yr. old female patient present for cataract eval per Dr. Charise Killian. 1. The patient complains of difficulty when viewing TV, reading closed caption, seeing road signs, recognizing people from a distance, which has been gradual for a period of time. Both eyes are affected, left eye worse than the right. The condition's severity is worsening. The condition is constant. This is negatively affecting the patient's quality of life and the patient is unable to function adequately in life with the current level of vision. Using Restasis BID OU. HPI was performed by [name] .  Medical History: Dry Eyes Glaucoma Cataracts OS: pigment on endothelium, OS: asteroid hyalosis Arthritis Cancer High Blood Pressure LDL Lung Problems Stroke anxiety, depression  Review of Systems Cardiovascular High Blood Pressure Ear, Nose, Mouth & Throat wears hearing aids Musculoskeletal RA pains Neurological Stroke Psychiatry Anxiety, Depression Respiratory Asthma All recorded systems are negative except as noted above.  Social Former smoker   Medication Restasis,  losartan-hydrochlorothiazide ,  atorvastatin ,  leflunomide ,  amlodipine ,  albuterol sulfate ,  potassium chloride ,  duloxetine ,  Trelegy Ellipta   Sx/Procedures Back Surgery, Tonsillectomy, Appendectomy, Gall bladder sx, Breast Reduction  Drug Allergies  hydromorphone ,  Sulfa (Sulfonamide Antibiotics)   History & Physical: Heent: cataract NECK: supple without bruits LUNGS: lungs clear to auscultation CV: regular rate and rhythm Abdomen: soft and non-tender  Impression & Plan: Assessment: 1.  COMBINED FORMS AGE RELATED CATARACT; Both Eyes (H25.813) 2.  ASTIGMATISM, REGULAR;  Both Eyes (H52.223) 3.  Dry Eye Syndrome; Both Eyes (H04.123) 4.  BLEPHARITIS; Right Upper Lid, Right Lower Lid, Left Upper Lid, Left Lower Lid (H01.001, H01.002,H01.004,H01.005) 5.  DERMATOCHALASIS, no surgery; Right Upper Lid, Left Upper Lid (H02.831, H02.834) 6.  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  Cataract accounts for the patient's decreased vision. This visual impairment is not correctable with a tolerable change in glasses or contact lenses. Cataract surgery with an implantation of a new lens should significantly improve the visual and functional status of the patient. Discussed all risks, benefits, alternatives, and potential complications. Discussed the procedures and recovery. Patient desires to have surgery. A-scan ordered and performed today for intra-ocular lens calculations. The surgery will be performed in order to improve vision for driving, reading, and for eye examinations. Recommend phacoemulsification with intra-ocular lens. Recommend Dextenza for post-operative pain and inflammation. Left Eye. worse - first. Dilates well - shugarcaine by protocol. Recommend toric IOL - VIvity toric or Eyhance.  2.  Recommend toric IOL OS only, Vivity vs. Eyhance.  3.  Continue Restasis 1 drop every 12 hours both eyes.  4.  Blepharitis is present - recommend regular lid cleaning.  5.  Likely visually significant.  6.  Asteroid hyalosis is a benign degenerative eye condition marked by a buildup of calcium and lipids (fats) in the fluid between your retina and lens, called the vitreous humor. It may cause some floaters.

## 2023-06-09 ENCOUNTER — Other Ambulatory Visit: Payer: Self-pay

## 2023-06-09 ENCOUNTER — Ambulatory Visit (HOSPITAL_COMMUNITY): Payer: Medicare Other | Admitting: Anesthesiology

## 2023-06-09 ENCOUNTER — Encounter (HOSPITAL_COMMUNITY)
Admission: RE | Disposition: A | Discharge status: Home / Self Care | Payer: Self-pay | Source: Home / Self Care | Attending: Ophthalmology

## 2023-06-09 ENCOUNTER — Encounter (HOSPITAL_COMMUNITY): Payer: Self-pay | Admitting: Ophthalmology

## 2023-06-09 ENCOUNTER — Ambulatory Visit: Payer: Medicare Other

## 2023-06-09 ENCOUNTER — Ambulatory Visit (HOSPITAL_COMMUNITY)
Admission: RE | Admit: 2023-06-09 | Discharge: 2023-06-09 | Disposition: A | Discharge status: Home / Self Care | Payer: Medicare Other | Attending: Ophthalmology | Admitting: Ophthalmology

## 2023-06-09 DIAGNOSIS — I10 Essential (primary) hypertension: Secondary | ICD-10-CM | POA: Insufficient documentation

## 2023-06-09 DIAGNOSIS — H0100B Unspecified blepharitis left eye, upper and lower eyelids: Secondary | ICD-10-CM | POA: Insufficient documentation

## 2023-06-09 DIAGNOSIS — I2510 Atherosclerotic heart disease of native coronary artery without angina pectoris: Secondary | ICD-10-CM

## 2023-06-09 DIAGNOSIS — F32A Depression, unspecified: Secondary | ICD-10-CM | POA: Diagnosis not present

## 2023-06-09 DIAGNOSIS — J4489 Other specified chronic obstructive pulmonary disease: Secondary | ICD-10-CM | POA: Insufficient documentation

## 2023-06-09 DIAGNOSIS — H4322 Crystalline deposits in vitreous body, left eye: Secondary | ICD-10-CM | POA: Diagnosis not present

## 2023-06-09 DIAGNOSIS — H25812 Combined forms of age-related cataract, left eye: Secondary | ICD-10-CM | POA: Insufficient documentation

## 2023-06-09 DIAGNOSIS — H02831 Dermatochalasis of right upper eyelid: Secondary | ICD-10-CM | POA: Diagnosis not present

## 2023-06-09 DIAGNOSIS — H02834 Dermatochalasis of left upper eyelid: Secondary | ICD-10-CM | POA: Diagnosis not present

## 2023-06-09 DIAGNOSIS — Z8673 Personal history of transient ischemic attack (TIA), and cerebral infarction without residual deficits: Secondary | ICD-10-CM | POA: Insufficient documentation

## 2023-06-09 DIAGNOSIS — H52223 Regular astigmatism, bilateral: Secondary | ICD-10-CM | POA: Diagnosis not present

## 2023-06-09 DIAGNOSIS — H04123 Dry eye syndrome of bilateral lacrimal glands: Secondary | ICD-10-CM | POA: Diagnosis not present

## 2023-06-09 DIAGNOSIS — H0100A Unspecified blepharitis right eye, upper and lower eyelids: Secondary | ICD-10-CM | POA: Diagnosis not present

## 2023-06-09 DIAGNOSIS — Z87891 Personal history of nicotine dependence: Secondary | ICD-10-CM | POA: Insufficient documentation

## 2023-06-09 HISTORY — PX: CATARACT EXTRACTION W/PHACO: SHX586

## 2023-06-09 SURGERY — PHACOEMULSIFICATION, CATARACT, WITH IOL INSERTION
Anesthesia: Monitor Anesthesia Care | Site: Eye | Laterality: Left

## 2023-06-09 MED ORDER — SODIUM HYALURONATE 10 MG/ML IO SOLUTION
PREFILLED_SYRINGE | INTRAOCULAR | Status: DC | PRN
  Administered 2023-06-09: .85 mL via INTRAOCULAR

## 2023-06-09 MED ORDER — MIDAZOLAM HCL 2 MG/2ML IJ SOLN
INTRAMUSCULAR | Status: AC
Start: 2023-06-09 — End: ?
  Filled 2023-06-09: qty 2

## 2023-06-09 MED ORDER — PHENYLEPHRINE HCL 2.5 % OP SOLN
1.0000 [drp] | OPHTHALMIC | Status: AC | PRN
  Administered 2023-06-09 (×3): 1 [drp] via OPHTHALMIC

## 2023-06-09 MED ORDER — BSS IO SOLN
INTRAOCULAR | Status: DC | PRN
  Administered 2023-06-09: 15 mL via INTRAOCULAR

## 2023-06-09 MED ORDER — TETRACAINE HCL 0.5 % OP SOLN
1.0000 [drp] | OPHTHALMIC | Status: AC | PRN
  Administered 2023-06-09 (×3): 1 [drp] via OPHTHALMIC

## 2023-06-09 MED ORDER — POVIDONE-IODINE 5 % OP SOLN
OPHTHALMIC | Status: DC | PRN
  Administered 2023-06-09: 1 via OPHTHALMIC

## 2023-06-09 MED ORDER — TROPICAMIDE 1 % OP SOLN
1.0000 [drp] | OPHTHALMIC | Status: AC | PRN
  Administered 2023-06-09 (×3): 1 [drp] via OPHTHALMIC

## 2023-06-09 MED ORDER — MIDAZOLAM HCL 2 MG/2ML IJ SOLN
INTRAMUSCULAR | Status: DC | PRN
  Administered 2023-06-09: 1 mg via INTRAVENOUS

## 2023-06-09 MED ORDER — SODIUM HYALURONATE 23MG/ML IO SOSY
PREFILLED_SYRINGE | INTRAOCULAR | Status: DC | PRN
  Administered 2023-06-09: .6 mL via INTRAOCULAR

## 2023-06-09 MED ORDER — STERILE WATER FOR IRRIGATION IR SOLN
Status: DC | PRN
  Administered 2023-06-09: 1

## 2023-06-09 MED ORDER — MOXIFLOXACIN HCL 5 MG/ML IO SOLN
INTRAOCULAR | Status: DC | PRN
  Administered 2023-06-09: .3 mL via INTRACAMERAL

## 2023-06-09 MED ORDER — EPINEPHRINE PF 1 MG/ML IJ SOLN
INTRAOCULAR | Status: DC | PRN
  Administered 2023-06-09: 500 mL

## 2023-06-09 MED ORDER — LIDOCAINE HCL 3.5 % OP GEL
1.0000 | Freq: Once | OPHTHALMIC | Status: AC
  Administered 2023-06-09: 1 via OPHTHALMIC

## 2023-06-09 MED ORDER — LIDOCAINE HCL (PF) 1 % IJ SOLN
INTRAOCULAR | Status: DC | PRN
  Administered 2023-06-09: 1 mL via OPHTHALMIC

## 2023-06-09 MED ORDER — SODIUM CHLORIDE 0.9% FLUSH
INTRAVENOUS | Status: DC | PRN
  Administered 2023-06-09: 5 mL via INTRAVENOUS

## 2023-06-09 SURGICAL SUPPLY — 13 items
CATARACT SUITE SIGHTPATH (MISCELLANEOUS) ×1
CLOTH BEACON ORANGE TIMEOUT ST (SAFETY) ×1 IMPLANT
EYE SHIELD UNIVERSAL CLEAR (GAUZE/BANDAGES/DRESSINGS) IMPLANT
FEE CATARACT SUITE SIGHTPATH (MISCELLANEOUS) ×1 IMPLANT
GLOVE BIOGEL PI IND STRL 7.0 (GLOVE) ×2 IMPLANT
LENS IOL TECNIS EYHANCE 19.5 (Intraocular Lens) IMPLANT
NDL HYPO 18GX1.5 BLUNT FILL (NEEDLE) ×1 IMPLANT
NEEDLE HYPO 18GX1.5 BLUNT FILL (NEEDLE) ×1
PAD ARMBOARD 7.5X6 YLW CONV (MISCELLANEOUS) ×1 IMPLANT
POSITIONER HEAD 8X9X4 ADT (SOFTGOODS) ×1 IMPLANT
SYR TB 1ML LL NO SAFETY (SYRINGE) ×1 IMPLANT
TAPE SURG TRANSPORE 1 IN (GAUZE/BANDAGES/DRESSINGS) IMPLANT
WATER STERILE IRR 250ML POUR (IV SOLUTION) ×1 IMPLANT

## 2023-06-09 NOTE — Op Note (Signed)
Date of procedure: 06/09/23  Pre-operative diagnosis: Visually significant age-related combined cataract, Left Eye (H25.812)  Post-operative diagnosis: Visually significant age-related combined cataract, Left Eye (H25.812)  Procedure: Removal of cataract via phacoemulsification and insertion of intra-ocular lens Laural Benes and Johnson DIB00 +19.5D into the capsular bag of the Left Eye  Attending surgeon: Rudy Jew. Cutter Passey, MD, MA  Anesthesia: MAC, Topical Akten  Complications: None  Estimated Blood Loss: <58mL (minimal)  Specimens: None  Implants: As above  Indications:  Visually significant age-related cataract, Left Eye  Procedure:  The patient was seen and identified in the pre-operative area. The operative eye was identified and dilated.  The operative eye was marked.  Topical anesthesia was administered to the operative eye.     The patient was then to the operative suite and placed in the supine position.  A timeout was performed confirming the patient, procedure to be performed, and all other relevant information.   The patient's face was prepped and draped in the usual fashion for intra-ocular surgery.  A lid speculum was placed into the operative eye and the surgical microscope moved into place and focused.  An inferotemporal paracentesis was created using a 20 gauge paracentesis blade.  Shugarcaine was injected into the anterior chamber.  Viscoelastic was injected into the anterior chamber.  A temporal clear-corneal main wound incision was created using a 2.52mm microkeratome.  A continuous curvilinear capsulorrhexis was initiated using an irrigating cystitome and completed using capsulorrhexis forceps.  Hydrodissection and hydrodeliniation were performed.  Viscoelastic was injected into the anterior chamber.  A phacoemulsification handpiece and a chopper as a second instrument were used to remove the nucleus and epinucleus. The irrigation/aspiration handpiece was used to remove any  remaining cortical material.   The capsular bag was reinflated with viscoelastic, checked, and found to be intact.  The intraocular lens was inserted into the capsular bag.  The irrigation/aspiration handpiece was used to remove any remaining viscoelastic.  The clear corneal wound and paracentesis wounds were then hydrated and checked with Weck-Cels to be watertight. 0.43mL of Moxfloxacin was injected into the anterior chamber. The lid-speculum was removed.  The drape was removed.  The patient's face was cleaned with a wet and dry 4x4.    A clear shield was taped over the eye. The patient was taken to the post-operative care unit in good condition, having tolerated the procedure well.  Post-Op Instructions: The patient will follow up at [facility] for a same day post-operative evaluation and will receive all other orders and instructions.

## 2023-06-09 NOTE — Interval H&P Note (Signed)
History and Physical Interval Note:  06/09/2023 12:30 PM  Betty Golden  has presented today for surgery, with the diagnosis of combined forms age related cataract, left eye.  The various methods of treatment have been discussed with the patient and family. After consideration of risks, benefits and other options for treatment, the patient has consented to  Procedure(s): CATARACT EXTRACTION PHACO AND INTRAOCULAR LENS PLACEMENT (IOC) (Left) as a surgical intervention.  The patient's history has been reviewed, patient examined, no change in status, stable for surgery.  I have reviewed the patient's chart and labs.  Questions were answered to the patient's satisfaction.     [name]

## 2023-06-09 NOTE — Anesthesia Postprocedure Evaluation (Signed)
Anesthesia Post Note  Patient: Betty Golden  Procedure(s) Performed: CATARACT EXTRACTION PHACO AND INTRAOCULAR LENS PLACEMENT (IOC) (Left: Eye)  Patient location during evaluation: PACU Anesthesia Type: MAC Level of consciousness: awake and alert Pain management: pain level controlled Vital Signs Assessment: post-procedure vital signs reviewed and stable Respiratory status: spontaneous breathing, nonlabored ventilation, respiratory function stable and patient connected to nasal cannula oxygen Cardiovascular status: stable and blood pressure returned to baseline Postop Assessment: no apparent nausea or vomiting Anesthetic complications: no   There were no known notable events for this encounter.   Last Vitals:  Vitals:   06/09/23 1144 06/09/23 1259  BP: 125/65 125/80  Pulse: 81 80  Resp: 15 16  Temp: 36.6 C 36.5 C  SpO2: 100% 100%    Last Pain:  Vitals:   06/09/23 1259  TempSrc: Oral  PainSc:                  Gaetano Hawthorne

## 2023-06-09 NOTE — Discharge Instructions (Signed)
Please discharge patient when stable, will follow up today with Dr. Carnella Fryman at the [facility] [place] office immediately following discharge.  Leave shield in place until visit.  All paperwork with discharge instructions will be given at the office.  [facility] Address:  [address]  [place], [place] [address]  

## 2023-06-09 NOTE — Transfer of Care (Addendum)
Immediate Anesthesia Transfer of Care Note  Patient: [name]  Procedure(s) Performed: CATARACT EXTRACTION PHACO AND INTRAOCULAR LENS PLACEMENT (IOC) (Left: Eye)  Patient Location: Short Stay  Anesthesia Type:MAC  Level of Consciousness: awake and patient cooperative  Airway & Oxygen Therapy: Patient Spontanous Breathing  Post-op Assessment: Report given to RN and Post -op Vital signs reviewed and stable  Post vital signs: Reviewed and stable  Last Vitals:  Vitals Value Taken Time  BP 125/80 06/09/23   1259  Temp 36.5 06/09/23   1259  Pulse 80 06/09/23   1259  Resp 16 06/09/23   1259  SpO2 100% 06/09/23   1259    Last Pain:  Vitals:   06/09/23 1144  TempSrc: Oral  PainSc: 0-No pain         Complications: No notable events documented.

## 2023-06-09 NOTE — Anesthesia Preprocedure Evaluation (Addendum)
Anesthesia Evaluation  Patient identified by MRN, date of birth, ID band Patient awake    Reviewed: Allergy & Precautions, H&P , Patient's Chart, lab work & pertinent test results, reviewed documented beta blocker date and time   Airway Mallampati: II  TM Distance: >3 FB Neck ROM: full    Dental  (+) Edentulous Upper, Edentulous Lower   Pulmonary shortness of breath, asthma , pneumonia, COPD, former smoker   Pulmonary exam normal breath sounds clear to auscultation       Cardiovascular hypertension, On Medications + CAD  Normal cardiovascular exam Rhythm:regular Rate:Normal     Neuro/Psych  Headaches PSYCHIATRIC DISORDERS  Depression    TIA Neuromuscular disease    GI/Hepatic ,GERD  ,,  Endo/Other    Renal/GU Renal disease     Musculoskeletal  (+) Arthritis , Osteoarthritis,  Fibromyalgia -  Abdominal Normal abdominal exam  (+)   Peds  Hematology   Anesthesia Other Findings Hypertension   High cholesterol     Chronic headaches   Asthma    Shortness of breath  SOB with exertion Colitis  01/2015  Diverticulosis   Lung nodule  per CT stable/benign  Hypoxia   C. difficile colitis    HNP (herniated nucleus pulposus), lumbar   Arthritis    GERD (gastroesophageal reflux disease)   Depression       Reproductive/Obstetrics                             Anesthesia Physical Anesthesia Plan  ASA: 3  Anesthesia Plan: MAC   Post-op Pain Management:    Induction:   PONV Risk Score and Plan:   Airway Management Planned: Nasal Cannula and Natural Airway  Additional Equipment: None  Intra-op Plan:   Post-operative Plan:   Informed Consent:      Dental advisory given  Plan Discussed with: CRNA  Anesthesia Plan Comments: (  Discussed general anesthesia, including possible nausea, instrumentation of airway, sore throat,pulmonary aspiration, etc. I asked if the were any  outstanding questions, or  concerns before we proceeded. )        Anesthesia Quick Evaluation

## 2023-06-11 ENCOUNTER — Encounter (HOSPITAL_COMMUNITY): Payer: Self-pay | Admitting: Ophthalmology

## 2023-06-12 ENCOUNTER — Encounter: Payer: Medicare Other | Admitting: Physical Therapy

## 2023-06-16 ENCOUNTER — Encounter (HOSPITAL_COMMUNITY)
Admission: RE | Admit: 2023-06-16 | Discharge: 2023-06-16 | Disposition: A | Discharge status: Home / Self Care | Payer: Medicare Other | Source: Ambulatory Visit | Attending: Ophthalmology | Admitting: Ophthalmology

## 2023-06-17 ENCOUNTER — Encounter (HOSPITAL_COMMUNITY): Payer: Self-pay

## 2023-06-18 NOTE — H&P (Signed)
Surgical History & Physical  Patient Name: Betty Golden  DOB: May 19, 1946  Surgery: Cataract extraction with intraocular lens implant phacoemulsification; Right Eye Surgeon: [name] MD Surgery Date: 06/23/2023 Pre-Op Date: 06/16/2023  HPI: A 65 Yr. old female patient 1.  The patient is returning for a 1 week post op follow-up of the left eye and ore-op in right eye. Since the last visit, the affected area is doing well. The patient's vision is improved. The condition's severity is constant. Patient is following medication instructions. The patient experiences no flashes, floater, shadow, curtain or veil. Patient also her for continued blurry vision in the right eye. She states she has trouble reading fine print in a book with the od eye. This is negatively affecting the patient's quality of life and the patient is unable to function adequately in life with the current level of vision. Patient wishes to have surgery. HPI was performed by [name] .  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  Social Former smoker  Medication Restasis, Prednisolone-moxiflox-bromfen,  losartan-hydrochlorothiazide ,  atorvastatin ,  leflunomide ,  amlodipine ,  albuterol sulfate ,  potassium chloride ,  duloxetine ,  Trelegy Ellipta   Sx/Procedures Phaco c IOL OS,  Back Surgery, Tonsillectomy, Appendectomy, Gall bladder sx, Breast Reduction  Drug Allergies  hydromorphone ,  Sulfa (Sulfonamide Antibiotics)   History & Physical: Heent: cataract NECK: supple without bruits LUNGS: lungs clear to  auscultation CV: regular rate and rhythm Abdomen: soft and non-tender  Impression & Plan: Assessment: 1.  CATARACT EXTRACTION STATUS; Left Eye (Z98.42) 2.  COMBINED FORMS AGE RELATED CATARACT; Right Eye (H25.811) 3.  NUCLEAR SCLEROSIS AGE RELATED; Right Eye (H25.11)  Plan: 1.  1 week after cataract surgery. Doing well with improved vision and normal eye pressure. Call with any problems or concerns. Continue Pred-Moxi-Brom 2x/day for 3 more weeks.  2.  Cataract accounts for the patient's decreased vision. This visual impairment is not correctable with a tolerable change in glasses or contact lenses. Cataract surgery with an implantation of a new lens should significantly improve the visual and functional status of the patient. Discussed all risks, benefits, alternatives, and potential complications. Discussed the procedures and recovery. Patient desires to have surgery. A-scan ordered and performed today for intra-ocular lens calculations. The surgery will be performed in order to improve vision for driving, reading, and for eye examinations. Recommend phacoemulsification with intra-ocular lens. Recommend Dextenza for post-operative pain and inflammation. Right Eye. Surgery required to correct imbalance of vision. Dilates well - shugarcaine by protocol.  3. See above

## 2023-06-23 ENCOUNTER — Encounter (HOSPITAL_COMMUNITY)
Admission: RE | Disposition: A | Discharge status: Home / Self Care | Payer: Self-pay | Source: Home / Self Care | Attending: Ophthalmology

## 2023-06-23 ENCOUNTER — Encounter (HOSPITAL_COMMUNITY): Payer: Self-pay | Admitting: Ophthalmology

## 2023-06-23 ENCOUNTER — Ambulatory Visit (HOSPITAL_COMMUNITY)
Admission: RE | Admit: 2023-06-23 | Discharge: 2023-06-23 | Disposition: A | Discharge status: Home / Self Care | Payer: Medicare Other | Attending: Ophthalmology | Admitting: Ophthalmology

## 2023-06-23 ENCOUNTER — Other Ambulatory Visit: Payer: Self-pay

## 2023-06-23 ENCOUNTER — Ambulatory Visit (HOSPITAL_BASED_OUTPATIENT_CLINIC_OR_DEPARTMENT_OTHER): Payer: Medicare Other | Admitting: Certified Registered"

## 2023-06-23 ENCOUNTER — Ambulatory Visit (HOSPITAL_COMMUNITY): Payer: Medicare Other | Admitting: Certified Registered"

## 2023-06-23 DIAGNOSIS — I2510 Atherosclerotic heart disease of native coronary artery without angina pectoris: Secondary | ICD-10-CM | POA: Diagnosis not present

## 2023-06-23 DIAGNOSIS — H25811 Combined forms of age-related cataract, right eye: Secondary | ICD-10-CM

## 2023-06-23 DIAGNOSIS — J4489 Other specified chronic obstructive pulmonary disease: Secondary | ICD-10-CM | POA: Diagnosis not present

## 2023-06-23 DIAGNOSIS — Z8673 Personal history of transient ischemic attack (TIA), and cerebral infarction without residual deficits: Secondary | ICD-10-CM | POA: Diagnosis not present

## 2023-06-23 DIAGNOSIS — G709 Myoneural disorder, unspecified: Secondary | ICD-10-CM | POA: Insufficient documentation

## 2023-06-23 DIAGNOSIS — M1990 Unspecified osteoarthritis, unspecified site: Secondary | ICD-10-CM | POA: Insufficient documentation

## 2023-06-23 DIAGNOSIS — M797 Fibromyalgia: Secondary | ICD-10-CM | POA: Diagnosis not present

## 2023-06-23 DIAGNOSIS — F32A Depression, unspecified: Secondary | ICD-10-CM | POA: Insufficient documentation

## 2023-06-23 DIAGNOSIS — I10 Essential (primary) hypertension: Secondary | ICD-10-CM | POA: Diagnosis not present

## 2023-06-23 DIAGNOSIS — Z87891 Personal history of nicotine dependence: Secondary | ICD-10-CM | POA: Diagnosis not present

## 2023-06-23 DIAGNOSIS — Z79899 Other long term (current) drug therapy: Secondary | ICD-10-CM | POA: Insufficient documentation

## 2023-06-23 DIAGNOSIS — Z9842 Cataract extraction status, left eye: Secondary | ICD-10-CM | POA: Insufficient documentation

## 2023-06-23 HISTORY — PX: CATARACT EXTRACTION W/PHACO: SHX586

## 2023-06-23 SURGERY — PHACOEMULSIFICATION, CATARACT, WITH IOL INSERTION
Anesthesia: Monitor Anesthesia Care | Site: Eye | Laterality: Right

## 2023-06-23 MED ORDER — LIDOCAINE HCL 3.5 % OP GEL
1.0000 | Freq: Once | OPHTHALMIC | Status: AC
  Administered 2023-06-23: 1 via OPHTHALMIC

## 2023-06-23 MED ORDER — STERILE WATER FOR IRRIGATION IR SOLN
Status: DC | PRN
  Administered 2023-06-23: 250 mL

## 2023-06-23 MED ORDER — SODIUM HYALURONATE 10 MG/ML IO SOLUTION
PREFILLED_SYRINGE | INTRAOCULAR | Status: DC | PRN
  Administered 2023-06-23: .55 mL via INTRAOCULAR

## 2023-06-23 MED ORDER — SODIUM CHLORIDE 0.9% FLUSH
INTRAVENOUS | Status: DC | PRN
  Administered 2023-06-23: 5 mL via INTRAVENOUS

## 2023-06-23 MED ORDER — MOXIFLOXACIN HCL 5 MG/ML IO SOLN
INTRAOCULAR | Status: DC | PRN
  Administered 2023-06-23: .2 mL via INTRACAMERAL

## 2023-06-23 MED ORDER — PHENYLEPHRINE HCL 2.5 % OP SOLN
1.0000 [drp] | OPHTHALMIC | Status: AC | PRN
  Administered 2023-06-23 (×3): 1 [drp] via OPHTHALMIC

## 2023-06-23 MED ORDER — EPINEPHRINE PF 1 MG/ML IJ SOLN
INTRAOCULAR | Status: DC | PRN
  Administered 2023-06-23: 500 mL

## 2023-06-23 MED ORDER — SODIUM HYALURONATE 23MG/ML IO SOSY
PREFILLED_SYRINGE | INTRAOCULAR | Status: DC | PRN
  Administered 2023-06-23: .6 mL via INTRAOCULAR

## 2023-06-23 MED ORDER — MIDAZOLAM HCL 2 MG/2ML IJ SOLN
INTRAMUSCULAR | Status: AC
Start: 2023-06-23 — End: ?
  Filled 2023-06-23: qty 2

## 2023-06-23 MED ORDER — LIDOCAINE HCL (PF) 1 % IJ SOLN
INTRAOCULAR | Status: DC | PRN
  Administered 2023-06-23: 1 mL via OPHTHALMIC

## 2023-06-23 MED ORDER — TROPICAMIDE 1 % OP SOLN
1.0000 [drp] | OPHTHALMIC | Status: AC | PRN
  Administered 2023-06-23 (×3): 1 [drp] via OPHTHALMIC

## 2023-06-23 MED ORDER — BSS IO SOLN
INTRAOCULAR | Status: DC | PRN
  Administered 2023-06-23: 15 mL via INTRAOCULAR

## 2023-06-23 MED ORDER — TETRACAINE HCL 0.5 % OP SOLN
1.0000 [drp] | OPHTHALMIC | Status: AC | PRN
  Administered 2023-06-23 (×3): 1 [drp] via OPHTHALMIC

## 2023-06-23 MED ORDER — MIDAZOLAM HCL 2 MG/2ML IJ SOLN
INTRAMUSCULAR | Status: DC | PRN
  Administered 2023-06-23: 1 mg via INTRAVENOUS

## 2023-06-23 MED ORDER — POVIDONE-IODINE 5 % OP SOLN
OPHTHALMIC | Status: DC | PRN
  Administered 2023-06-23: 1 via OPHTHALMIC

## 2023-06-23 SURGICAL SUPPLY — 14 items
CATARACT SUITE SIGHTPATH (MISCELLANEOUS) ×1
CLOTH BEACON ORANGE TIMEOUT ST (SAFETY) ×1 IMPLANT
EYE SHIELD UNIVERSAL CLEAR (GAUZE/BANDAGES/DRESSINGS) IMPLANT
FEE CATARACT SUITE SIGHTPATH (MISCELLANEOUS) ×1 IMPLANT
GLOVE BIOGEL PI IND STRL 7.0 (GLOVE) ×2 IMPLANT
LENS IOL TECNIS EYHANCE 19.0 (Intraocular Lens) IMPLANT
NDL HYPO 18GX1.5 BLUNT FILL (NEEDLE) ×1 IMPLANT
NEEDLE HYPO 18GX1.5 BLUNT FILL (NEEDLE) ×1
PAD ARMBOARD 7.5X6 YLW CONV (MISCELLANEOUS) ×1 IMPLANT
POSITIONER HEAD 8X9X4 ADT (SOFTGOODS) ×1 IMPLANT
RING MALYGIN 7.0 (MISCELLANEOUS) IMPLANT
SYR TB 1ML LL NO SAFETY (SYRINGE) ×1 IMPLANT
TAPE SURG TRANSPORE 1 IN (GAUZE/BANDAGES/DRESSINGS) IMPLANT
WATER STERILE IRR 250ML POUR (IV SOLUTION) ×1 IMPLANT

## 2023-06-23 NOTE — Op Note (Signed)
Date of procedure: 06/23/23  Pre-operative diagnosis:  Visually significant combined form age-related cataract, Right Eye (H25.811)  Post-operative diagnosis:  Visually significant combined form age-related cataract, Right Eye (H25.811)  Procedure: Removal of cataract via phacoemulsification and insertion of intra-ocular lens Laural Benes and Johnson DIB00 +19.0D into the capsular bag of the Right Eye  Attending surgeon: Rudy Jew. Cyerra Yim, MD, MA  Anesthesia: MAC, Topical Akten  Complications: None  Estimated Blood Loss: <47mL (minimal)  Specimens: None  Implants: As above  Indications:  Visually significant age-related cataract, Right Eye  Procedure:  The patient was seen and identified in the pre-operative area. The operative eye was identified and dilated.  The operative eye was marked.  Topical anesthesia was administered to the operative eye.     The patient was then to the operative suite and placed in the supine position.  A timeout was performed confirming the patient, procedure to be performed, and all other relevant information.   The patient's face was prepped and draped in the usual fashion for intra-ocular surgery.  A lid speculum was placed into the operative eye and the surgical microscope moved into place and focused.  A superotemporal paracentesis was created using a 20 gauge paracentesis blade.  Shugarcaine was injected into the anterior chamber.  Viscoelastic was injected into the anterior chamber.  A temporal clear-corneal main wound incision was created using a 2.49mm microkeratome.  A continuous curvilinear capsulorrhexis was initiated using an irrigating cystitome and completed using capsulorrhexis forceps.  Hydrodissection and hydrodeliniation were performed.  Viscoelastic was injected into the anterior chamber.  A phacoemulsification handpiece and a chopper as a second instrument were used to remove the nucleus and epinucleus. The irrigation/aspiration handpiece was used to  remove any remaining cortical material.   The capsular bag was reinflated with viscoelastic, checked, and found to be intact.  The intraocular lens was inserted into the capsular bag.  The irrigation/aspiration handpiece was used to remove any remaining viscoelastic.  The clear corneal wound and paracentesis wounds were then hydrated and checked with Weck-Cels to be watertight. 0.28mL of Moxfloxacin was injected into the anterior chamber. The lid-speculum was removed.  The drape was removed.  The patient's face was cleaned with a wet and dry 4x4. A clear shield was taped over the eye. The patient was taken to the post-operative care unit in good condition, having tolerated the procedure well.  Post-Op Instructions: The patient will follow up at [facility] for a same day post-operative evaluation and will receive all other orders and instructions.

## 2023-06-23 NOTE — Interval H&P Note (Signed)
History and Physical Interval Note:  06/23/2023 9:03 AM  Betty Golden  has presented today for surgery, with the diagnosis of combined forms age related cataract, right eye.  The various methods of treatment have been discussed with the patient and family. After consideration of risks, benefits and other options for treatment, the patient has consented to  Procedure(s) with comments: CATARACT EXTRACTION PHACO AND INTRAOCULAR LENS PLACEMENT (IOC) (Right) - CDE: as a surgical intervention.  The patient's history has been reviewed, patient examined, no change in status, stable for surgery.  I have reviewed the patient's chart and labs.  Questions were answered to the patient's satisfaction.     [name]

## 2023-06-23 NOTE — Transfer of Care (Signed)
Immediate Anesthesia Transfer of Care Note  Patient: Betty Golden  Procedure(s) Performed: CATARACT EXTRACTION PHACO AND INTRAOCULAR LENS PLACEMENT (IOC) (Right: Eye)  Patient Location: Short Stay  Anesthesia Type:MAC  Level of Consciousness: awake, alert , and oriented  Airway & Oxygen Therapy: Patient Spontanous Breathing  Post-op Assessment: Report given to RN and Post -op Vital signs reviewed and stable  Post vital signs: Reviewed and stable  Last Vitals:  Vitals Value Taken Time  BP    Temp    Pulse    Resp    SpO2      Last Pain:  Vitals:   06/23/23 0825  TempSrc: Oral  PainSc: 0-No pain         Complications: No notable events documented.

## 2023-06-23 NOTE — Discharge Instructions (Addendum)
Please discharge patient when stable, will follow up today with Dr. Carnella Fryman at the [facility] [place] office immediately following discharge.  Leave shield in place until visit.  All paperwork with discharge instructions will be given at the office.  [facility] Address:  [address]  [place], [place] [address]  

## 2023-06-23 NOTE — Anesthesia Procedure Notes (Signed)
Procedure Name: MAC Date/Time: 06/23/2023 9:08 AM  Performed by: Julian Reil, CRNAPre-anesthesia Checklist: Patient identified, Emergency Drugs available, Suction available and Patient being monitored Patient Re-evaluated:Patient Re-evaluated prior to induction Oxygen Delivery Method: Nasal cannula Placement Confirmation: positive ETCO2

## 2023-06-23 NOTE — Anesthesia Postprocedure Evaluation (Signed)
Anesthesia Post Note  Patient: TANGULA HETMAN  Procedure(s) Performed: CATARACT EXTRACTION PHACO AND INTRAOCULAR LENS PLACEMENT (IOC) (Right: Eye)  Patient location during evaluation: PACU Anesthesia Type: MAC Level of consciousness: awake and alert Pain management: pain level controlled Vital Signs Assessment: post-procedure vital signs reviewed and stable Respiratory status: spontaneous breathing, nonlabored ventilation, respiratory function stable and patient connected to nasal cannula oxygen Cardiovascular status: stable and blood pressure returned to baseline Postop Assessment: no apparent nausea or vomiting Anesthetic complications: no   There were no known notable events for this encounter.   Last Vitals:  Vitals:   06/23/23 0927 06/23/23 0928  BP: 133/64   Pulse: 79   Resp:    Temp:  36.5 C  SpO2: 100%     Last Pain:  Vitals:   06/23/23 0927  TempSrc:   PainSc: 0-No pain                 Yazmine Sorey L Jadi Deyarmin

## 2023-06-23 NOTE — Anesthesia Preprocedure Evaluation (Addendum)
Anesthesia Evaluation  Patient identified by MRN, date of birth, ID band Patient awake    Reviewed: Allergy & Precautions, H&P , Patient's Chart, lab work & pertinent test results, reviewed documented beta blocker date and time   Airway Mallampati: II  TM Distance: >3 FB Neck ROM: full    Dental  (+) Edentulous Upper, Edentulous Lower   Pulmonary shortness of breath, asthma , pneumonia, COPD, former smoker   Pulmonary exam normal breath sounds clear to auscultation       Cardiovascular hypertension, On Medications + CAD  Normal cardiovascular exam Rhythm:regular Rate:Normal     Neuro/Psych  Headaches PSYCHIATRIC DISORDERS  Depression    TIA Neuromuscular disease    GI/Hepatic ,GERD  ,,  Endo/Other    Renal/GU Renal disease     Musculoskeletal  (+) Arthritis , Osteoarthritis,  Fibromyalgia -  Abdominal Normal abdominal exam  (+)   Peds  Hematology   Anesthesia Other Findings Hypertension   High cholesterol     Chronic headaches   Asthma    Shortness of breath  SOB with exertion Colitis  01/2015  Diverticulosis   Lung nodule  per CT stable/benign  Hypoxia   C. difficile colitis    HNP (herniated nucleus pulposus), lumbar   Arthritis    GERD (gastroesophageal reflux disease)   Depression       Reproductive/Obstetrics                             Anesthesia Physical Anesthesia Plan  ASA: 3  Anesthesia Plan: MAC   Post-op Pain Management:    Induction:   PONV Risk Score and Plan:   Airway Management Planned: Nasal Cannula and Natural Airway  Additional Equipment: None  Intra-op Plan:   Post-operative Plan:   Informed Consent:      Dental advisory given  Plan Discussed with: CRNA  Anesthesia Plan Comments: (  Discussed general anesthesia, including possible nausea, instrumentation of airway, sore throat,pulmonary aspiration, etc. I asked if the were any  outstanding questions, or  concerns before we proceeded. )        Anesthesia Quick Evaluation

## 2023-06-28 DIAGNOSIS — J4550 Severe persistent asthma, uncomplicated: Secondary | ICD-10-CM | POA: Diagnosis not present

## 2023-06-30 ENCOUNTER — Encounter (HOSPITAL_COMMUNITY): Payer: Self-pay | Admitting: Ophthalmology

## 2023-07-11 ENCOUNTER — Encounter: Payer: Self-pay | Admitting: Allergy & Immunology

## 2023-07-15 ENCOUNTER — Ambulatory Visit: Payer: Medicare Other | Attending: Gastroenterology | Admitting: Physical Therapy

## 2023-07-15 ENCOUNTER — Encounter: Payer: Self-pay | Admitting: Physical Therapy

## 2023-07-15 DIAGNOSIS — R278 Other lack of coordination: Secondary | ICD-10-CM | POA: Insufficient documentation

## 2023-07-15 DIAGNOSIS — M6281 Muscle weakness (generalized): Secondary | ICD-10-CM | POA: Diagnosis present

## 2023-07-15 NOTE — Therapy (Signed)
OUTPATIENT PHYSICAL THERAPY FEMALE PELVIC TREATMENT   Patient Name: Betty Golden MRN: [id number] DOB:[birth year], 77 y.o., female Today's Date: 07/15/2023  END OF SESSION:  PT End of Session - 07/15/23 1000     Visit Number 3    Date for PT Re-Evaluation 07/17/23    Authorization Type [employer]ICARE    PT Start Time 0930    PT Stop Time 1015    PT Time Calculation (min) 45 min    Activity Tolerance Patient tolerated treatment well    Behavior During Therapy WFL for tasks assessed/performed               Past Medical History:  Diagnosis Date   Asthma    C. difficile colitis    Cervical stenosis of spine    Chronic headaches    Chronic neck pain    Coronary artery disease    Depression    Diverticulosis    Essential hypertension    Fibromyalgia    GERD (gastroesophageal reflux disease)    History of Salmonella gastroenteritis    HNP (herniated nucleus pulposus), lumbar    Hyperlipidemia    IBS (irritable bowel syndrome)    Lung nodule    rheumatoid arthritis    Past Surgical History:  Procedure Laterality Date   APPENDECTOMY  1965   BIOPSY  08/26/2018   Procedure: BIOPSY;  Surgeon: Malissa Hippo, MD;  Location: [facility];  Service: Endoscopy;;  gastric   BREAST BIOPSY     BREAST REDUCTION SURGERY  1994   CATARACT EXTRACTION W/PHACO Left 06/09/2023   Procedure: CATARACT EXTRACTION PHACO AND INTRAOCULAR LENS PLACEMENT (IOC);  Surgeon: [name], MD;  Location: [facility];  Service: Ophthalmology;  Laterality: Left;  CDE 7.03   CATARACT EXTRACTION W/PHACO Right 06/23/2023   Procedure: CATARACT EXTRACTION PHACO AND INTRAOCULAR LENS PLACEMENT (IOC);  Surgeon: [name], MD;  Location: [facility];  Service: Ophthalmology;  Laterality: Right;  CDE: 11.55   CHOLECYSTECTOMY  1975   COLONOSCOPY N/A 08/19/2013   Procedure: COLONOSCOPY;  Surgeon: Malissa Hippo, MD;  Location: [facility];  Service: Endoscopy;  Laterality: N/A;  155-moved  to 140 Ann to notify pt   COLONOSCOPY N/A 08/26/2018   Procedure: COLONOSCOPY;  Surgeon: Malissa Hippo, MD;  Location: [facility];  Service: Endoscopy;  Laterality: N/A;   ESOPHAGOGASTRODUODENOSCOPY N/A 08/26/2018   Procedure: ESOPHAGOGASTRODUODENOSCOPY (EGD);  Surgeon: Malissa Hippo, MD;  Location: [facility];  Service: Endoscopy;  Laterality: N/A;   fibromyalgia     LUMBAR LAMINECTOMY/DECOMPRESSION MICRODISCECTOMY Bilateral 09/20/2015   Procedure: MICRO LUMBAR DECOMPRESSION L5-S1 BILATERAL    (1 LEVEL);  Surgeon: [name], MD;  Location: [facility];  Service: Orthopedics;  Laterality: Bilateral;   rheumatoid arthritis     Sinus Surgery     TONSILLECTOMY  1968   Patient Active Problem List   Diagnosis Date Noted   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   Fecal incontinence 02/06/2023   Malnutrition of moderate degree 09/27/2021   Acute respiratory failure with hypoxia (HCC)    Acute urinary retention 09/26/2021   Aspiration pneumonia (HCC) 09/26/2021   RA (rheumatoid arthritis) (HCC) 09/25/2021   Leukocytosis 09/25/2021   Viral pneumonia 04/20/2019   TIA (transient ischemic attack) 04/16/2019   Dyspnea 04/04/2019   COVID-19 virus infection 04/04/2019   Gastritis and gastroduodenitis 07/08/2018   Rectal bleeding 06/29/2018   Fibromyalgia 09/08/2017   Spinal stenosis of lumbar region 09/20/2015   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/03/2015  Moderate persistent asthma 07/03/2015   Allergic rhinitis 07/03/2015   Colitis due to Clostridium difficile 02/10/2015   Abdominal pain 02/10/2015   Nausea and vomiting 02/10/2015   AKI (acute kidney injury) (HCC) 02/10/2015   Dehydration 02/10/2015   Hypoxia 02/10/2015   Hyponatremia 02/10/2015   Hypokalemia 02/10/2015   COPD (chronic obstructive pulmonary disease) (HCC)    Constipation 07/15/2013   DYSPNEA 04/05/2008   Essential hypertension 03/10/2008   Headache 03/10/2008   Cough 03/10/2008    PCP: Exie Parody, MD PCP -  General  REFERRING PROVIDER: Dolores Frame, MD Ref Provider  REFERRING DIAG: [phone] (ICD-10-CM) - Dyssynergic defecation  THERAPY DIAG:  Other lack of coordination  Muscle weakness (generalized)  Rationale for Evaluation and Treatment: Rehabilitation  ONSET DATE: 2020  SUBJECTIVE:                                                                                                                                                                                           SUBJECTIVE STATEMENT: Pt reports that her poop does not come out now, it lets her know that she needs to go to the bathroom. Using squatty potty, Taking benefiber. Coffee helps. Trying to do exercises at the house.  She had recent cataract surgery. Had surgery on her leg. Had a flare up of her fibromyalgia. Has been back and forth to the doctor. Has not had an accident since she got on benefiber. Has type 4 bristol stool scale. Has not been constipated.  Daughter stated that her mom is good now.  Pt not doing well with her leg today.  Trying to walk, helps with constipation.  Seeing her arthritis dr tomorrow,  Can see after her cataract surgery.   Fluid intake: Yes: drinks a lot of water    PAIN:  Are you having pain? Yes NPRS scale: 7/10- all over, mostly legs Pain location:  legs  Pain type: aching Pain description: aching   Aggravating factors: just comes Relieving factors: nothing  PRECAUTIONS: None  RED FLAGS: Bowel or bladder incontinence: No Not lately- since 2 weeks ago  WEIGHT BEARING RESTRICTIONS: No  FALLS:  Has patient fallen in last 6 months? 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  OCCUPATION: retired  PLOF: Independent with household mobility with device  PATIENT GOALS: to not have accidents- per daughter  PERTINENT HISTORY:   Sexual abuse: No  BOWEL  MOVEMENT: Pain with bowel movement: No Type of bowel movement:Type (Bristol Stool Scale) 3-4 Fully empty rectum: Yes:   Leakage: No  Pads: No Fiber supplement: Yes: bene fiber  URINATION: Pain with urination: No Fully empty bladder: Yes:   Stream: Strong Urgency: no, more with water Frequency: yes Leakage:  no Pads: No  INTERCOURSE:- not sexually active  PREGNANCY:- N/A    OBJECTIVE:  Note: Objective measures were completed at Evaluation unless otherwise noted.     COGNITION: Overall cognitive status: Within functional limits for tasks assessed     SENSATION: Light touch: Appears intact Proprioception: Appears intact  MUSCLE LENGTH: Hamstrings: Right 90 deg; Left 80 deg  LUMBAR SPECIAL TESTS:  FABER test: Positive    GAIT: Distance walked: 40 feet Assistive device utilized: Quad cane large base Level of assistance: SBA   POSTURE: rounded shoulders, forward head, and decreased lumbar lordosis  PELVIC ALIGNMENT:  LUMBARAROM/PROM:  A/PROM A/PROM  eval  Flexion Difficult-50%  Extension Difficult 50%  Right lateral flexion   Left lateral flexion   Right rotation   Left rotation    (Blank rows = not tested)  LOWER EXTREMITY ROM:  Passive ROM Right eval Left eval  Hip flexion Limited  Within functional limitations   Hip extension limited limited  Hip abduction    Hip adduction    Hip internal rotation limited Within functional limitations   Hip external rotation limited Within functional limitations   Knee flexion    Knee extension    Ankle dorsiflexion    Ankle plantarflexion    Ankle inversion    Ankle eversion     (Blank rows = not tested)  LOWER EXTREMITY MMT:  MMT Right eval Left eval  Hip flexion 3/5 3/5  Hip extension    Hip abduction 3/5 3/5  Hip adduction    Hip internal rotation    Hip external rotation    Knee flexion    Knee extension    Ankle dorsiflexion    Ankle plantarflexion    Ankle inversion    Ankle  eversion     PALPATION:   General  within functional limitations                 External Perineal Exam contraction palpable through clothing                             Internal Pelvic Floor deferred   Patient confirms identification and approves PT to assess internal pelvic floor and treatment Yes  PELVIC MMT:   MMT eval  Vaginal 4/5  Internal Anal Sphincter 3/5  External Anal Sphincter 3/5  Puborectalis   Diastasis Recti Yes- 2 fingers above umbilicus  (Blank rows = not tested)        TONE: deferred  PROLAPSE: Deferred   TODAY'S TREATMENT:                                                                                                                              DATE: 07/15/23    Neuro  reed- horizontal abduction with yellow theraband Ball press with transverse abdominis breath  Product manager      PATIENT EDUCATION:  Education details: abdominal massage, constipation overview, Training and development officer, relevant anatomy Person educated: Patient and Child(ren) Education method: Explanation, Demonstration, Tactile cues, Verbal cues, and Handouts Education comprehension: verbalized understanding and needs further education  HOME EXERCISE PROGRAM: [id number]  ASSESSMENT:  CLINICAL IMPRESSION: Pt has made good progress with benefiber and some exercises, has been more active, able to walk better, keeping stool at bristol stool scale 4.    OBJECTIVE IMPAIRMENTS: cardiopulmonary status limiting activity, decreased activity tolerance, decreased balance, decreased coordination, decreased endurance, decreased knowledge of condition, decreased mobility, difficulty walking, decreased ROM, decreased strength, hypomobility, increased fascial restrictions, and pain.   ACTIVITY LIMITATIONS: carrying, lifting, bending, standing, stairs, transfers, bed mobility, continence, bathing, toileting, reach over head, locomotion level, and caring for others  PARTICIPATION LIMITATIONS: meal  prep, cleaning, laundry, medication management, personal finances, interpersonal relationship, driving, shopping, community activity, occupation, yard work, school, and church  PERSONAL FACTORS: Age, Time since onset of injury/illness/exacerbation, Transportation, and 1-2 comorbidities: fibromyalgia, rheumatoid arthritis  are also affecting patient's functional outcome.   REHAB POTENTIAL: Good  CLINICAL DECISION MAKING: Evolving/moderate complexity  EVALUATION COMPLEXITY: Low   GOALS: Goals reviewed with patient? Yes  SHORT TERM GOALS: Target date: 06/19/2023    Pt will be independent with HEP.   Baseline: Goal status: met  2.  Pt will report reduced fecal accidents to max 1/week Baseline:  Goal status: met  3.  Pt will report her BMs are complete at least 75% of the time due to improved bowel habits and evacuation techniques.  Baseline:  Goal status: met   LONG TERM GOALS: Target date: 07/17/2023    Pt will be independent with advanced HEP.   Baseline:  Goal status: discharged  2.  Pt will report 0 accidents/ week Baseline:  Goal status: met  3.  Pt will be independent with use of squatty potty, relaxed toileting mechanics, and improved bowel movement techniques in order to increase ease of bowel movements and complete evacuation.   Baseline:  Goal status: met  4.  Pt will participate in senior exercise class at least 2/ week in order to improve mobility and endurance  and reduce pain in bilateral hips and knees.  Baseline:  Goal status: discharged  PLAN:  PT FREQUENCY: 1-2x/week  PT DURATION: 8 weeks  PLANNED INTERVENTIONS: 97110-Therapeutic exercises, 97530- Therapeutic activity, 97112- Neuromuscular re-education, 97535- Self Care, 69629- Manual therapy, Dry Needling, Joint mobilization, Joint manipulation, Spinal manipulation, Spinal mobilization, Scar mobilization, and Biofeedback  PLAN FOR NEXT SESSION: discharge PT  Kyrus Hyde, PT 07/15/23 10:05 AM    PHYSICAL THERAPY DISCHARGE SUMMARY  Visits from Start of Care: 3  Current functional level related to goals / functional outcomes: Bristol stool scale 4, no fecal accidentss, no constipation   Remaining deficits: Difficulty with ambulation, pain   Education / Equipment: HEP, theraband, relevant anatomy, squatty potty, constipation management   Patient agrees to discharge. Patient goals were met. Patient is being discharged due to being pleased with the current functional level.

## 2023-07-28 ENCOUNTER — Ambulatory Visit (INDEPENDENT_AMBULATORY_CARE_PROVIDER_SITE_OTHER): Payer: Medicare Other

## 2023-07-28 DIAGNOSIS — J4550 Severe persistent asthma, uncomplicated: Secondary | ICD-10-CM

## 2023-07-31 ENCOUNTER — Other Ambulatory Visit: Payer: Self-pay | Admitting: *Deleted

## 2023-07-31 ENCOUNTER — Encounter: Payer: Self-pay | Admitting: Allergy & Immunology

## 2023-07-31 MED ORDER — FASENRA 30 MG/ML ~~LOC~~ SOSY: 30.0000 mg | PREFILLED_SYRINGE | SUBCUTANEOUS | 6 refills | Status: DC

## 2023-08-27 DIAGNOSIS — J4550 Severe persistent asthma, uncomplicated: Secondary | ICD-10-CM

## 2023-09-05 ENCOUNTER — Encounter: Payer: Self-pay | Admitting: Allergy & Immunology

## 2023-09-22 ENCOUNTER — Ambulatory Visit: Payer: Medicare Other

## 2023-09-24 ENCOUNTER — Ambulatory Visit: Payer: Medicare Other

## 2023-09-29 ENCOUNTER — Ambulatory Visit: Payer: Medicare Other

## 2023-10-15 ENCOUNTER — Ambulatory Visit: Payer: Medicare Other | Admitting: Allergy & Immunology

## 2023-10-15 ENCOUNTER — Ambulatory Visit

## 2023-10-15 ENCOUNTER — Encounter: Payer: Self-pay | Admitting: Allergy & Immunology

## 2023-10-15 VITALS — BP 112/68 | HR 104 | Temp 98.1°F | Resp 12 | Ht 62.0 in | Wt 150.4 lb

## 2023-10-15 DIAGNOSIS — J4550 Severe persistent asthma, uncomplicated: Secondary | ICD-10-CM

## 2023-10-15 DIAGNOSIS — B999 Unspecified infectious disease: Secondary | ICD-10-CM

## 2023-10-15 DIAGNOSIS — J3089 Other allergic rhinitis: Secondary | ICD-10-CM | POA: Diagnosis not present

## 2023-10-15 MED ORDER — TRELEGY ELLIPTA 200-62.5-25 MCG/ACT IN AEPB: 1.0000 | INHALATION_SPRAY | Freq: Every day | RESPIRATORY_TRACT | 0 refills | Status: DC

## 2023-10-15 MED ORDER — TRELEGY ELLIPTA 200-62.5-25 MCG/ACT IN AEPB: 1.0000 | INHALATION_SPRAY | Freq: Every morning | RESPIRATORY_TRACT | 3 refills | Status: AC

## 2023-10-15 MED ORDER — PANTOPRAZOLE SODIUM 40 MG PO TBEC: 40.0000 mg | DELAYED_RELEASE_TABLET | Freq: Every day | ORAL | 1 refills | Status: DC

## 2023-10-15 MED ORDER — FLUTICASONE PROPIONATE 50 MCG/ACT NA SUSP: NASAL | 1 refills | Status: DC

## 2023-10-15 NOTE — Progress Notes (Unsigned)
 FOLLOW UP  Date of Service/Encounter:  10/15/23   Assessment:   Severe persistent asthma without complication   Chronic rhinitis (last testing performed in [place])   Recurrent infections - with normal workup in the past (May 2020)   Rheumatoid arthritis - recently transitioned to Enbrel (followed by Dr. Trinna Post)    Fibromyalgia   History of stroke (September 2020)   Normocytic anemia  Recent skin cancer removal (Dr. Orvan Falconer)   Plan/Recommendations:   Assessment and Plan              There are no Patient Instructions on file for this visit.   Subjective:   Betty Golden is a 78 y.o. 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   Fecal incontinence 02/06/2023   Malnutrition of moderate degree 09/27/2021   Acute respiratory failure with hypoxia (HCC)    Acute urinary retention 09/26/2021   Aspiration pneumonia (HCC) 09/26/2021   RA (rheumatoid arthritis) (HCC) 09/25/2021   Leukocytosis 09/25/2021   Viral pneumonia 04/20/2019   TIA (transient ischemic attack) 04/16/2019   Dyspnea 04/04/2019   COVID-19 virus infection 04/04/2019   Gastritis and gastroduodenitis 07/08/2018   Rectal bleeding 06/29/2018   Fibromyalgia 09/08/2017   Spinal stenosis of lumbar region 09/20/2015   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/03/2015   Moderate persistent asthma 07/03/2015   Allergic rhinitis 07/03/2015   Colitis due to Clostridium difficile 02/10/2015   Abdominal pain 02/10/2015   Nausea and vomiting 02/10/2015   AKI (acute kidney injury) (HCC) 02/10/2015   Dehydration 02/10/2015   Hypoxia 02/10/2015   Hyponatremia 02/10/2015   Hypokalemia 02/10/2015   COPD (chronic obstructive pulmonary disease) (HCC)    Constipation 07/15/2013   DYSPNEA 04/05/2008   Essential hypertension 03/10/2008    Headache 03/10/2008   Cough 03/10/2008    History obtained from: chart review and {Persons; PED relatives w/patient:19415::"patient"}.  Discussed the use of AI scribe software for clinical note transcription with the patient and/or guardian, who gave verbal consent to proceed.  Betty Golden is a 78 y.o. female presenting for {Blank single:19197::"a food challenge","a drug challenge","skin testing","a sick visit","an evaluation of ***","a follow up visit"}.  She was last seen in September 2024.  At that time, we continue with Flonase as well as cetirizine.  Her lung function looked fantastic.  Betty Golden readings were looking great as well.  We continue with Fasenra every 8 weeks as well as Trelegy 1 puff daily.  Since last visit, she has done well  Discussed the use of AI scribe software for clinical note transcription with the patient, who gave verbal consent to proceed.  History of Present Illness            Asthma/Respiratory Symptom History: ***  Allergic Rhinitis Symptom History: ***  Food Allergy Symptom History: ***  Skin Symptom History: ***  GERD Symptom History: ***  Infection Symptom History: ***  Otherwise, there have been no changes to her past medical history, surgical history, family history, or social history.    Review of systems otherwise negative other than that mentioned in the HPI.    Objective:   Blood pressure 112/68, pulse (!) 104, temperature 98.1 F (36.7 C), resp. rate 12, height 5\' 2"  (1.575 m), weight 150 lb 6 oz (68.2 kg), SpO2 94%. Body mass index is 27.5  kg/m.    Physical Exam   Diagnostic studies:    Spirometry: results normal (FEV1: 1.60/90%, FVC: 2.21/97%, FEV1/FVC: 72%).    Spirometry consistent with normal pattern.    Allergy Studies: {Blank single:19197::"none","deferred due to recent antihistamine use","deferred due to insurance stipulations that require a separate visit for testing","labs sent instead"," "}    {Blank  single:19197::"Allergy testing results were read and interpreted by myself, documented by clinical staff."," "}      Betty Bonds, MD  [facility]

## 2023-10-15 NOTE — Progress Notes (Unsigned)
 Medication Samples have been provided to the patient.  Drug name: Trelegy 200  Qty: 2  LOT: [id number]  Exp.Date: 12/25/24  The patient has been instructed regarding the correct time, dose, and frequency of taking this medication, including desired effects and most common side effects.   Betty Golden 4:52 PM 10/15/2023

## 2023-10-15 NOTE — Patient Instructions (Addendum)
 1. Chronic nonseasonal allergic rhinitis - Continue with fluticasone two sprays per nostril one to two times daily.  - Increase during period of sinus issues.  - Continue with cetirizine (Zyrtec) 10mg  daily.    2. Moderate persistent asthma, uncomplicated - Lung function looks AWESOME today.  - I do not think that we need to make changes, but if you want something more affordable, we can certainly finding something.  - Daily controller medication(s):  Fasenra every 8 weeks  + Trelegy one puff once daily  - Prior to physical activity: albuterol 2 puffs 10-15 minutes before physical activity. - Rescue medications: albuterol 4 puffs every 4-6 hours as needed - Asthma control goals:  * Full participation in all desired activities (may need albuterol before activity) * Albuterol use two time or less a week on average (not counting use with activity) * Cough interfering with sleep two time or less a month * Oral steroids no more than once a year * No hospitalizations  3. Return in about 6 months (around 04/16/2024). You can have the follow up appointment with Dr. Dellis Anes or a Nurse Practicioner (our Nurse Practitioners are excellent and always have Physician oversight!).   GO ON THAT CRUISE! You deserve it!    Please inform us of any Emergency Department visits, hospitalizations, or changes in symptoms. Call us before going to the ED for breathing or allergy symptoms since we might be able to fit you in for a sick visit. Feel free to contact us anytime with any questions, problems, or concerns.  It was a pleasure to see you again today!  Websites that have reliable patient information: 1. American Academy of Asthma, Allergy, and Immunology: www.aaaai.org 2. Food Allergy Research and Education (FARE): foodallergy.org 3. Mothers of Asthmatics: http://www.asthmacommunitynetwork.org 4. American College of Allergy, Asthma, and Immunology: www.acaai.org      "Like" [place] on Facebook and  Instagram for our latest updates!      A healthy democracy works best when [employer] participate! Make sure you are registered to vote! If you have moved or changed any of your contact information, you will need to get this updated before voting! Scan the QR codes below to learn more!

## 2023-10-17 ENCOUNTER — Encounter: Payer: Self-pay | Admitting: Allergy & Immunology

## 2023-11-14 ENCOUNTER — Inpatient Hospital Stay: Payer: Medicare Other | Attending: Hematology and Oncology

## 2023-11-14 ENCOUNTER — Inpatient Hospital Stay: Payer: Medicare Other | Admitting: Hematology and Oncology

## 2023-11-14 ENCOUNTER — Other Ambulatory Visit: Payer: Self-pay | Admitting: Hematology and Oncology

## 2023-11-14 VITALS — BP 137/70 | HR 84 | Temp 97.4°F | Resp 16 | Wt 147.3 lb

## 2023-11-14 DIAGNOSIS — Z87891 Personal history of nicotine dependence: Secondary | ICD-10-CM | POA: Diagnosis not present

## 2023-11-14 DIAGNOSIS — Z79899 Other long term (current) drug therapy: Secondary | ICD-10-CM | POA: Diagnosis not present

## 2023-11-14 DIAGNOSIS — E538 Deficiency of other specified B group vitamins: Secondary | ICD-10-CM | POA: Insufficient documentation

## 2023-11-14 DIAGNOSIS — D72829 Elevated white blood cell count, unspecified: Secondary | ICD-10-CM | POA: Insufficient documentation

## 2023-11-14 DIAGNOSIS — Z7969 Long term (current) use of other immunomodulators and immunosuppressants: Secondary | ICD-10-CM | POA: Insufficient documentation

## 2023-11-14 DIAGNOSIS — D72825 Bandemia: Secondary | ICD-10-CM

## 2023-11-14 DIAGNOSIS — D649 Anemia, unspecified: Secondary | ICD-10-CM

## 2023-11-14 LAB — CMP (CANCER CENTER ONLY)
ALT: 10 U/L (ref 0–44)
AST: 19 U/L (ref 15–41)
Albumin: 4.7 g/dL (ref 3.5–5.0)
Alkaline Phosphatase: 66 U/L (ref 38–126)
Anion gap: 7 (ref 5–15)
BUN: 18 mg/dL (ref 8–23)
CO2: 29 mmol/L (ref 22–32)
Calcium: 10.3 mg/dL (ref 8.9–10.3)
Chloride: 104 mmol/L (ref 98–111)
Creatinine: 1.2 mg/dL — ABNORMAL HIGH (ref 0.44–1.00)
GFR, Estimated: 47 mL/min — ABNORMAL LOW (ref >60)
Glucose, Bld: 88 mg/dL (ref 70–99)
Potassium: 3.6 mmol/L (ref 3.5–5.1)
Sodium: 140 mmol/L (ref 135–145)
Total Bilirubin: 0.5 mg/dL (ref 0.0–1.2)
Total Protein: 8.1 g/dL (ref 6.5–8.1)

## 2023-11-14 LAB — CBC WITH DIFFERENTIAL (CANCER CENTER ONLY)
Abs Immature Granulocytes: 0.02 10*3/uL (ref 0.00–0.07)
Basophils Absolute: 0.1 10*3/uL (ref 0.0–0.1)
Basophils Relative: 0 %
Eosinophils Absolute: 0 10*3/uL (ref 0.0–0.5)
Eosinophils Relative: 0 %
HCT: 39.2 % (ref 36.0–46.0)
Hemoglobin: 12.8 g/dL (ref 12.0–15.0)
Immature Granulocytes: 0 %
Lymphocytes Relative: 47 %
Lymphs Abs: 6 10*3/uL — ABNORMAL HIGH (ref 0.7–4.0)
MCH: 27.3 pg (ref 26.0–34.0)
MCHC: 32.7 g/dL (ref 30.0–36.0)
MCV: 83.6 fL (ref 80.0–100.0)
Monocytes Absolute: 0.9 10*3/uL (ref 0.1–1.0)
Monocytes Relative: 7 %
Neutro Abs: 5.9 10*3/uL (ref 1.7–7.7)
Neutrophils Relative %: 46 %
Platelet Count: 250 10*3/uL (ref 150–400)
RBC: 4.69 MIL/uL (ref 3.87–5.11)
RDW: 13.4 % (ref 11.5–15.5)
WBC Count: 12.8 10*3/uL — ABNORMAL HIGH (ref 4.0–10.5)
nRBC: 0 % (ref 0.0–0.2)

## 2023-11-14 LAB — VITAMIN B12: Vitamin B-12: 468 pg/mL (ref 180–914)

## 2023-11-14 NOTE — Progress Notes (Unsigned)
 [facility] [phone]   [phone]  PROGRESS NOTE  Patient Care Team: Rice Chamorro, MD as PCP - General (Family Medicine) Idolina Maker Mirna Amis, MD as Consulting Physician (Allergy and Immunology)  CHIEF COMPLAINTS/PURPOSE OF CONSULTATION:  Leukocytosis and anemia with B12 deficiency  HISTORY OF PRESENTING ILLNESS:  Betty Golden 78 y.o. female returns for a follow up for leukocytosis and anemia. She is unaccompanied today.   On exam today, Betty Golden reports she has been well overall in the interim since our last visit.  She did have a area of skin cancer on her left arm which was removed via Mohs surgery.  She reports that she has had areas removed in May 2024 in July 2024.  She reports her energy is "no energy".  She reports that she is taking her vitamin B12 pills without difficulty.  She still notes that she is not eating quite right.  She is sometimes go 2 or 3 days without eating.  She notes that she does not eat much in the way of red meat.  She denies any lightheadedness, dizziness, or shortness of breath.  She has had no recent infection such as runny nose, sore throat, cough.  Otherwise she reports nothing out of the ordinary.  Full 10 point ROS is otherwise negative.Betty Golden  MEDICAL HISTORY:  Past Medical History:  Diagnosis Date   Asthma    C. difficile colitis    Cervical stenosis of spine    Chronic headaches    Chronic neck pain    Coronary artery disease    Depression    Diverticulosis    Essential hypertension    Fibromyalgia    GERD (gastroesophageal reflux disease)    History of Salmonella gastroenteritis    HNP (herniated nucleus pulposus), lumbar    Hyperlipidemia    IBS (irritable bowel syndrome)    Lung nodule    rheumatoid arthritis     SURGICAL HISTORY: Past Surgical History:  Procedure Laterality Date   APPENDECTOMY  1965   BIOPSY  08/26/2018   Procedure: BIOPSY;  Surgeon: Ruby Corporal, MD;   Location: [facility];  Service: Endoscopy;;  gastric   BREAST BIOPSY     BREAST REDUCTION SURGERY  1994   CATARACT EXTRACTION W/PHACO Left 06/09/2023   Procedure: CATARACT EXTRACTION PHACO AND INTRAOCULAR LENS PLACEMENT (IOC);  Surgeon: Tarri Farm, MD;  Location: [facility];  Service: Ophthalmology;  Laterality: Left;  CDE 7.03   CATARACT EXTRACTION W/PHACO Right 06/23/2023   Procedure: CATARACT EXTRACTION PHACO AND INTRAOCULAR LENS PLACEMENT (IOC);  Surgeon: Tarri Farm, MD;  Location: [facility];  Service: Ophthalmology;  Laterality: Right;  CDE: 11.55   CHOLECYSTECTOMY  1975   COLONOSCOPY N/A 08/19/2013   Procedure: COLONOSCOPY;  Surgeon: Ruby Corporal, MD;  Location: [facility];  Service: Endoscopy;  Laterality: N/A;  155-moved to 140 Ann to notify pt   COLONOSCOPY N/A 08/26/2018   Procedure: COLONOSCOPY;  Surgeon: Ruby Corporal, MD;  Location: [facility];  Service: Endoscopy;  Laterality: N/A;   ESOPHAGOGASTRODUODENOSCOPY N/A 08/26/2018   Procedure: ESOPHAGOGASTRODUODENOSCOPY (EGD);  Surgeon: Ruby Corporal, MD;  Location: [facility];  Service: Endoscopy;  Laterality: N/A;   fibromyalgia     LUMBAR LAMINECTOMY/DECOMPRESSION MICRODISCECTOMY Bilateral 09/20/2015   Procedure: MICRO LUMBAR DECOMPRESSION L5-S1 BILATERAL    (1 LEVEL);  Surgeon: Orvan Blanch, MD;  Location: [facility];  Service: Orthopedics;  Laterality: Bilateral;   rheumatoid arthritis     Sinus  Surgery     TONSILLECTOMY  1968    SOCIAL HISTORY: Social History   Socioeconomic History   Marital status: Single    Spouse name: Not on file   Number of children: 1   Years of education: Not on file   Highest education level: GED or equivalent  Occupational History   Not on file  Tobacco Use   Smoking status: Former    Current packs/day: 0.00    Average packs/day: 2.0 packs/day for 10.0 years (20.0 ttl pk-yrs)    Types: Cigarettes    Start date: 09/11/1978    Quit date: 09/11/1988    Years since quitting:  35.1   Smokeless tobacco: Never   Tobacco comments:    quit 1990  Vaping Use   Vaping status: Never Used  Substance and Sexual Activity   Alcohol use: No    Comment: stopped in 1990   Drug use: No    Comment: hx smoking marijuana and cocaine weekends. Stopped in the 1990s.   Sexual activity: Not on file  Other Topics Concern   Not on file  Social History Narrative   Lives at home with her daughter & daughter's family (they live with her)   Right handed   Drinks no caffeine   Social Drivers of Corporate investment banker Strain: Low Risk  (04/04/2019)   Overall Financial Resource Strain (CARDIA)    Difficulty of Paying Living Expenses: Not hard at all  Food Insecurity: No Food Insecurity (04/04/2019)   Hunger Vital Sign    Worried About Running Out of Food in the Last Year: Never true    Ran Out of Food in the Last Year: Never true  Transportation Needs: No Transportation Needs (04/04/2019)   PRAPARE - Administrator, Civil Service (Medical): No    Lack of Transportation (Non-Medical): No  Physical Activity: Inactive (04/04/2019)   Exercise Vital Sign    Days of Exercise per Week: 0 days    Minutes of Exercise per Session: 0 min  Stress: No Stress Concern Present (04/04/2019)   [employer] of Occupational Health - Occupational Stress Questionnaire    Feeling of Stress : Only a little  Social Connections: Moderately Integrated (04/04/2019)   Social Connection and Isolation Panel [NHANES]    Frequency of Communication with Friends and Family: More than three times a week    Frequency of Social Gatherings with Friends and Family: Three times a week    Attends Religious Services: More than 4 times per year    Active Member of Clubs or Organizations: Yes    Attends Banker Meetings: More than 4 times per year    Marital Status: Never married  Intimate Partner Violence: Not At Risk (04/04/2019)   Humiliation, Afraid, Rape, and Kick questionnaire    Fear of  Current or Ex-Partner: No    Emotionally Abused: No    Physically Abused: No    Sexually Abused: No    FAMILY HISTORY: Family History  Problem Relation Age of Onset   Heart disease Mother    Hypertension Mother    Asthma Mother    Heart disease Father    Stroke Father    Hypertension Father    Stroke Sister    Asthma Sister    Hypertension Brother    Asthma Brother    Asthma Maternal Grandmother    Heart disease Maternal Grandmother    Diabetes Maternal Grandfather    Asthma Maternal Aunt  Colon cancer Neg Hx     ALLERGIES:  is allergic to hydromorphone  and sulfa antibiotics.  MEDICATIONS:  Current Outpatient Medications  Medication Sig Dispense Refill   albuterol  (PROAIR  HFA) 108 (90 Base) MCG/ACT inhaler Inhale 2 puffs into the lungs every 4 (four) hours as needed for wheezing or shortness of breath. 18 g 3   amLODipine  (NORVASC ) 5 MG tablet Take 1 tablet (5 mg total) by mouth daily. 30 tablet 1   atorvastatin  (LIPITOR) 10 MG tablet Take 10 mg by mouth daily.     benralizumab  (FASENRA ) 30 MG/ML prefilled syringe Inject 1 mL (30 mg total) into the skin every 8 (eight) weeks. 1 mL 6   CALCIUM  PO Take 1,250 mg by mouth.     Cetirizine HCl (ZYRTEC PO) Take by mouth daily at 6 (six) AM.     clindamycin (CLEOCIN T) 1 % lotion Apply topically daily.     DULoxetine  (CYMBALTA ) 30 MG capsule Take 30 mg by mouth 2 (two) times daily.     Ferrous Sulfate (IRON PO) Take 65 mg by mouth daily.     fluticasone  (FLONASE ) 50 MCG/ACT nasal spray SPRAY 2 SPRAYS INTO EACH NOSTRIL EVERY DAY 48 mL 1   Fluticasone -Umeclidin-Vilant (TRELEGY ELLIPTA ) 200-62.5-25 MCG/ACT AEPB Inhale 1 puff into the lungs in the morning. 90 each 3   Fluticasone -Umeclidin-Vilant (TRELEGY ELLIPTA ) 200-62.5-25 MCG/ACT AEPB Inhale 1 puff into the lungs daily in the afternoon. 60 each 0   golimumab (SIMPONI ARIA) 50 MG/4ML SOLN injection Inject 50 mg into the vein every 8 (eight) weeks.     leflunomide (ARAVA) 20 MG  tablet Take 20 mg by mouth daily.     losartan -hydrochlorothiazide  (HYZAAR) 100-12.5 MG tablet Take 1 tablet by mouth daily.     MAGNESIUM  PO Take 400 mg by mouth daily at 6 (six) AM.     Multiple Vitamin (MULTIVITAMIN PO) Take by mouth daily at 6 (six) AM.     ondansetron  (ZOFRAN ) 4 MG tablet Take 1 tablet (4 mg total) by mouth every 8 (eight) hours as needed for nausea or vomiting. 30 tablet 0   pantoprazole  (PROTONIX ) 40 MG tablet Take 1 tablet (40 mg total) by mouth daily. 90 tablet 1   potassium chloride  SA (KLOR-CON  M) 20 MEQ tablet Take 1 tablet (20 mEq total) by mouth daily. 6 tablet 0   pregabalin  (LYRICA ) 50 MG capsule Take 50 mg by mouth 2 (two) times daily.     RESTASIS 0.05 % ophthalmic emulsion 1 drop 2 (two) times daily.     simvastatin  (ZOCOR ) 20 MG tablet Take 20 mg by mouth at bedtime.      vitamin B-12 (CYANOCOBALAMIN ) 1000 MCG tablet Take 1 tablet (1,000 mcg total) by mouth daily. 30 tablet 3   Current Facility-Administered Medications  Medication Dose Route Frequency Provider Last Rate Last Admin   Benralizumab  SOSY 30 mg  30 mg Subcutaneous Q8 Weeks Gallagher, Joel Louis, MD   30 mg at 10/15/23 1419    REVIEW OF SYSTEMS:   Constitutional: ( - ) fevers, ( - )  chills , ( - ) night sweats Eyes: ( - ) blurriness of vision, ( - ) double vision, ( - ) watery eyes Ears, nose, mouth, throat, and face: ( - ) mucositis, ( - ) sore throat Respiratory: ( - ) cough, ( - ) dyspnea, ( - ) wheezes Cardiovascular: ( - ) palpitation, ( - ) chest discomfort, ( - ) lower extremity swelling Gastrointestinal:  ( - )  nausea, ( - ) heartburn, ( -) change in bowel habits Skin: ( - ) abnormal skin rashes Lymphatics: ( - ) new lymphadenopathy, ( - ) easy bruising Neurological: ( - ) numbness, ( - ) tingling, ( - ) new weaknesses Behavioral/Psych: ( - ) mood change, ( - ) new changes  All other systems were reviewed with the patient and are negative.  PHYSICAL EXAMINATION: ECOG PERFORMANCE  STATUS: 1 - Symptomatic but completely ambulatory  There were no vitals filed for this visit. There were no vitals filed for this visit.  GENERAL: well appearing female in NAD  SKIN: skin color, texture, turgor are normal, no rashes or significant lesions. EYES: conjunctiva are pink and non-injected, sclera clear LUNGS: clear to auscultation and percussion with normal breathing effort HEART: regular rate & rhythm and no murmurs and no lower extremity edema Musculoskeletal: no cyanosis of digits and no clubbing  PSYCH: alert & oriented x 3, fluent speech NEURO: no focal motor/sensory deficits  LABORATORY DATA:  I have reviewed the data as listed    Latest Ref Rng & Units 01/21/2023    4:13 PM 11/14/2022    1:49 PM 05/15/2022    2:42 PM  CBC  WBC 4.0 - 10.5 K/uL 11.9  13.4  13.7   Hemoglobin 12.0 - 15.0 g/dL 16.1  09.6  04.5   Hematocrit 36.0 - 46.0 % 38.7  36.0  39.2   Platelets 150 - 400 K/uL 266  254  289        Latest Ref Rng & Units 01/21/2023    4:13 PM 11/14/2022    1:49 PM 05/15/2022    2:42 PM  CMP  Glucose 70 - 99 mg/dL 409  96  91   BUN 8 - 23 mg/dL 21  20  19    Creatinine 0.44 - 1.00 mg/dL 8.11  9.14  7.82   Sodium 135 - 145 mmol/L 139  143  142   Potassium 3.5 - 5.1 mmol/L 2.6  3.6  3.6   Chloride 98 - 111 mmol/L 101  106  105   CO2 22 - 32 mmol/L 24  30  30    Calcium  8.9 - 10.3 mg/dL 9.6  95.6  9.9   Total Protein 6.5 - 8.1 g/dL 7.4  7.4  8.0   Total Bilirubin 0.3 - 1.2 mg/dL 1.1  0.5  0.7   Alkaline Phos 38 - 126 U/L 65  56  61   AST 15 - 41 U/L 29  18  22    ALT 0 - 44 U/L 17  9  15     RADIOGRAPHIC STUDIES: No results found.  ASSESSMENT & PLAN Betty Golden is a 78 y.o. female returns for a follow up for leukocytosis and normocytic anemia.   # Leukocytosis, neutrophil predominant: --Likely secondary to RA with periodic flare ups.  --Workup from 10/16/2021 showed elevation of inflammatory markers. There is no evidence of myeloproliferative disorders  with normal MPN panel and  no evidence of BCR/ABL.  --Labs today show  leukocytosis with WBC 12.8, hemoglobin 12.8, MCV 83.6, platelets 250 --No evidence of infection as patient is afebrile without any cough, urinary symptoms, etc.  --Monitor for now.  # Normocytic anemia/vitamin B12 deficiency: --Multifactorial with renal dysfunction, RA and use of methotrexate , vitamin B12 deficiency --Labs today show improvement of anemia with Hgb 12.8 --Currently taking PO vitamin B12 replacement 1000 mcg daily, recommend to continue.   #Hypokalemia: --K is 3.6 today. Level has oscillated over the  past few months.  --Likely secondary to HCTZ --Sent prescription of potassium chloride  20 mEq daily x 30 days --Patient will need to follow up with PCP to monitor and to determine if refills are needed.   # Bruising-resolved: --Patient has multiple site of bruising with palpable induration. --Likely secondary to subcutaneous heparin  injections patient received during recent hospitalization per record review --Monitor for now.   Follow up: --RTC in 12 months with repeat labs.    No orders of the defined types were placed in this encounter.   All questions were answered. The patient knows to call the clinic with any problems, questions or concerns.  I have spent a total of 30 minutes minutes of face-to-face and non-face-to-face time, preparing to see the patient, performing a medically appropriate examination, counseling and educating the patient, ordering tests/meds, documenting clinical information in the electronic health record, and care coordination.   Rogerio Clay, MD Department of Hematology/Oncology [facility] Phone: [phone] Pager: [phone] Email: [email or web address]

## 2023-11-18 LAB — METHYLMALONIC ACID, SERUM: Methylmalonic Acid, Quantitative: 241 nmol/L (ref 0–378)

## 2023-12-08 ENCOUNTER — Ambulatory Visit

## 2023-12-15 ENCOUNTER — Ambulatory Visit

## 2023-12-15 DIAGNOSIS — J4550 Severe persistent asthma, uncomplicated: Secondary | ICD-10-CM

## 2024-02-09 ENCOUNTER — Ambulatory Visit

## 2024-02-09 DIAGNOSIS — J4550 Severe persistent asthma, uncomplicated: Secondary | ICD-10-CM

## 2024-02-27 ENCOUNTER — Observation Stay (HOSPITAL_COMMUNITY)
Admission: EM | Admit: 2024-02-27 | Discharge: 2024-02-28 | Disposition: A | Discharge status: Home / Self Care | Attending: Internal Medicine | Admitting: Internal Medicine

## 2024-02-27 ENCOUNTER — Emergency Department (HOSPITAL_COMMUNITY)

## 2024-02-27 ENCOUNTER — Encounter (HOSPITAL_COMMUNITY): Payer: Self-pay

## 2024-02-27 DIAGNOSIS — I10 Essential (primary) hypertension: Secondary | ICD-10-CM | POA: Insufficient documentation

## 2024-02-27 DIAGNOSIS — R2681 Unsteadiness on feet: Secondary | ICD-10-CM | POA: Diagnosis present

## 2024-02-27 DIAGNOSIS — J449 Chronic obstructive pulmonary disease, unspecified: Secondary | ICD-10-CM | POA: Diagnosis present

## 2024-02-27 DIAGNOSIS — R42 Dizziness and giddiness: Principal | ICD-10-CM

## 2024-02-27 DIAGNOSIS — J410 Simple chronic bronchitis: Secondary | ICD-10-CM | POA: Diagnosis not present

## 2024-02-27 DIAGNOSIS — E876 Hypokalemia: Secondary | ICD-10-CM | POA: Diagnosis not present

## 2024-02-27 DIAGNOSIS — E785 Hyperlipidemia, unspecified: Secondary | ICD-10-CM | POA: Diagnosis not present

## 2024-02-27 DIAGNOSIS — M797 Fibromyalgia: Secondary | ICD-10-CM | POA: Diagnosis present

## 2024-02-27 DIAGNOSIS — H538 Other visual disturbances: Secondary | ICD-10-CM | POA: Diagnosis present

## 2024-02-27 DIAGNOSIS — Z8673 Personal history of transient ischemic attack (TIA), and cerebral infarction without residual deficits: Secondary | ICD-10-CM | POA: Insufficient documentation

## 2024-02-27 DIAGNOSIS — I6601 Occlusion and stenosis of right middle cerebral artery: Secondary | ICD-10-CM | POA: Diagnosis not present

## 2024-02-27 LAB — BASIC METABOLIC PANEL WITH GFR
Anion gap: 13 (ref 5–15)
BUN: 24 mg/dL — ABNORMAL HIGH (ref 8–23)
CO2: 25 mmol/L (ref 22–32)
Calcium: 9.4 mg/dL (ref 8.9–10.3)
Chloride: 107 mmol/L (ref 98–111)
Creatinine, Ser: 1.23 mg/dL — ABNORMAL HIGH (ref 0.44–1.00)
GFR, Estimated: 46 mL/min — ABNORMAL LOW (ref >60)
Glucose, Bld: 77 mg/dL (ref 70–99)
Potassium: 3.4 mmol/L — ABNORMAL LOW (ref 3.5–5.1)
Sodium: 145 mmol/L (ref 135–145)

## 2024-02-27 LAB — CBC WITH DIFFERENTIAL/PLATELET
Basophils Absolute: 0 K/uL (ref 0.0–0.1)
Basophils Relative: 0 %
Eosinophils Absolute: 0 K/uL (ref 0.0–0.5)
Eosinophils Relative: 0 %
HCT: 38.7 % (ref 36.0–46.0)
Hemoglobin: 12.9 g/dL (ref 12.0–15.0)
Lymphocytes Relative: 40 %
Lymphs Abs: 7.9 K/uL — ABNORMAL HIGH (ref 0.7–4.0)
MCH: 28.8 pg (ref 26.0–34.0)
MCHC: 33.3 g/dL (ref 30.0–36.0)
MCV: 86.4 fL (ref 80.0–100.0)
Monocytes Absolute: 1.4 K/uL — ABNORMAL HIGH (ref 0.1–1.0)
Monocytes Relative: 7 %
Neutro Abs: 10.4 K/uL — ABNORMAL HIGH (ref 1.7–7.7)
Neutrophils Relative %: 53 %
Platelets: 253 K/uL (ref 150–400)
RBC: 4.48 MIL/uL (ref 3.87–5.11)
RDW: 14.7 % (ref 11.5–15.5)
WBC: 19.7 K/uL — ABNORMAL HIGH (ref 4.0–10.5)
nRBC: 0 % (ref 0.0–0.2)

## 2024-02-27 MED ORDER — MECLIZINE HCL 12.5 MG PO TABS
12.5000 mg | ORAL_TABLET | Freq: Once | ORAL | Status: AC
  Administered 2024-02-27: 12.5 mg via ORAL
  Filled 2024-02-27: qty 1

## 2024-02-27 MED ORDER — DEXTROSE IN LACTATED RINGERS 5 % IV SOLN: INTRAVENOUS | Status: DC

## 2024-02-27 MED ORDER — LEFLUNOMIDE 20 MG PO TABS
20.0000 mg | ORAL_TABLET | Freq: Every day | ORAL | Status: DC
  Administered 2024-02-28: 20 mg via ORAL
  Filled 2024-02-27 (×2): qty 1

## 2024-02-27 MED ORDER — ASPIRIN 81 MG PO TBEC
81.0000 mg | DELAYED_RELEASE_TABLET | Freq: Every day | ORAL | Status: DC
  Administered 2024-02-28: 81 mg via ORAL
  Filled 2024-02-27: qty 1

## 2024-02-27 MED ORDER — ACETAMINOPHEN 325 MG PO TABS: 650.0000 mg | ORAL_TABLET | Freq: Four times a day (QID) | ORAL | Status: DC | PRN

## 2024-02-27 MED ORDER — ONDANSETRON HCL 4 MG/2ML IJ SOLN: 4.0000 mg | Freq: Four times a day (QID) | INTRAMUSCULAR | Status: DC | PRN

## 2024-02-27 MED ORDER — CYCLOSPORINE 0.05 % OP EMUL
1.0000 [drp] | Freq: Two times a day (BID) | OPHTHALMIC | Status: DC
  Administered 2024-02-27 – 2024-02-28 (×2): 1 [drp] via OPHTHALMIC
  Filled 2024-02-27 (×2): qty 30

## 2024-02-27 MED ORDER — BUDESON-GLYCOPYRROL-FORMOTEROL 160-9-4.8 MCG/ACT IN AERO
2.0000 | INHALATION_SPRAY | Freq: Two times a day (BID) | RESPIRATORY_TRACT | Status: DC
  Administered 2024-02-27 – 2024-02-28 (×2): 2 via RESPIRATORY_TRACT
  Filled 2024-02-27: qty 5.9

## 2024-02-27 MED ORDER — ONDANSETRON HCL 4 MG/2ML IJ SOLN
4.0000 mg | Freq: Once | INTRAMUSCULAR | Status: AC
  Administered 2024-02-27: 4 mg via INTRAVENOUS
  Filled 2024-02-27: qty 2

## 2024-02-27 MED ORDER — ENOXAPARIN SODIUM 40 MG/0.4ML IJ SOSY
40.0000 mg | PREFILLED_SYRINGE | INTRAMUSCULAR | Status: DC
  Administered 2024-02-27: 40 mg via SUBCUTANEOUS
  Filled 2024-02-27: qty 0.4

## 2024-02-27 MED ORDER — ATORVASTATIN CALCIUM 10 MG PO TABS
10.0000 mg | ORAL_TABLET | Freq: Every day | ORAL | Status: DC
  Administered 2024-02-28: 10 mg via ORAL
  Filled 2024-02-27: qty 1

## 2024-02-27 MED ORDER — PANTOPRAZOLE SODIUM 40 MG PO TBEC
40.0000 mg | DELAYED_RELEASE_TABLET | Freq: Every day | ORAL | Status: DC
  Administered 2024-02-28: 40 mg via ORAL
  Filled 2024-02-27: qty 1

## 2024-02-27 MED ORDER — ACETAMINOPHEN 650 MG RE SUPP: 650.0000 mg | Freq: Four times a day (QID) | RECTAL | Status: DC | PRN

## 2024-02-27 MED ORDER — LOSARTAN POTASSIUM 50 MG PO TABS
100.0000 mg | ORAL_TABLET | Freq: Every day | ORAL | Status: DC
  Administered 2024-02-28: 100 mg via ORAL
  Filled 2024-02-27: qty 2

## 2024-02-27 MED ORDER — ALBUTEROL SULFATE (2.5 MG/3ML) 0.083% IN NEBU: 2.5000 mg | INHALATION_SOLUTION | RESPIRATORY_TRACT | Status: DC | PRN

## 2024-02-27 MED ORDER — ONDANSETRON HCL 4 MG PO TABS: 4.0000 mg | ORAL_TABLET | Freq: Four times a day (QID) | ORAL | Status: DC | PRN

## 2024-02-27 MED ORDER — LORAZEPAM 2 MG/ML IJ SOLN
1.0000 mg | Freq: Once | INTRAMUSCULAR | Status: AC
  Administered 2024-02-27: 1 mg via INTRAVENOUS
  Filled 2024-02-27: qty 1

## 2024-02-27 MED ORDER — ALBUTEROL SULFATE HFA 108 (90 BASE) MCG/ACT IN AERS: 2.0000 | INHALATION_SPRAY | RESPIRATORY_TRACT | Status: DC | PRN

## 2024-02-27 MED ORDER — DULOXETINE HCL 30 MG PO CPEP
30.0000 mg | ORAL_CAPSULE | Freq: Two times a day (BID) | ORAL | Status: DC
  Administered 2024-02-27 – 2024-02-28 (×2): 30 mg via ORAL
  Filled 2024-02-27 (×2): qty 1

## 2024-02-27 MED ORDER — POTASSIUM CHLORIDE CRYS ER 20 MEQ PO TBCR
40.0000 meq | EXTENDED_RELEASE_TABLET | ORAL | Status: AC
  Administered 2024-02-27 – 2024-02-28 (×2): 40 meq via ORAL
  Filled 2024-02-27 (×2): qty 2

## 2024-02-27 MED ORDER — SODIUM CHLORIDE 0.9 % IV BOLUS
500.0000 mL | Freq: Once | INTRAVENOUS | Status: AC
  Administered 2024-02-27: 500 mL via INTRAVENOUS

## 2024-02-27 MED ORDER — AMLODIPINE BESYLATE 5 MG PO TABS
10.0000 mg | ORAL_TABLET | Freq: Every day | ORAL | Status: DC
  Administered 2024-02-28: 10 mg via ORAL
  Filled 2024-02-27: qty 2

## 2024-02-27 NOTE — ED Provider Notes (Signed)
 [place] EMERGENCY DEPARTMENT AT [facility] Provider Note   CSN: [id number] Arrival date & time: 02/27/24  1705     Patient presents with: Blurred Vision and Dizziness   Betty Golden is a 78 y.o. female.  She is here with a complaint of blurry vision and dizziness unsteady gait that started sometime this morning after she woke up.  It has been persistent throughout the day.  This is a new symptom, never had before.  Associated with nausea.  She denies falling but states she has to hold onto things to walk.  Prior history of cataract surgery.  She recently started a new medication by her rheumatologist, Cimzia for her fibromyalgia.  She just had her second injection.  Few days ago.   The history is provided by the patient.  Dizziness Quality:  Room spinning and imbalance Severity:  Severe Onset quality:  Sudden Duration:  10 hours Timing:  Constant Progression:  Unchanged Chronicity:  New Relieved by:  Closing eyes Worsened by:  Movement and sitting upright Ineffective treatments:  None tried Associated symptoms: headaches, nausea and vision changes   Associated symptoms: no chest pain and no vomiting        Prior to Admission medications   Medication Sig Start Date End Date Taking? Authorizing Provider  albuterol  (PROAIR  HFA) 108 (90 Base) MCG/ACT inhaler Inhale 2 puffs into the lungs every 4 (four) hours as needed for wheezing or shortness of breath. 04/16/23   Iva Marty Saltness, MD  amLODipine  (NORVASC ) 5 MG tablet Take 1 tablet (5 mg total) by mouth daily. 10/01/21   Johnson, Clanford L, MD  atorvastatin  (LIPITOR) 10 MG tablet Take 10 mg by mouth daily. 03/16/23   [provider]  benralizumab  (FASENRA ) 30 MG/ML prefilled syringe Inject 1 mL (30 mg total) into the skin every 8 (eight) weeks. 07/31/23   Iva Marty Saltness, MD  CALCIUM  PO Take 1,250 mg by mouth.    [provider]  Cetirizine HCl (ZYRTEC PO) Take by mouth daily at 6 (six)  AM.    [provider]  clindamycin (CLEOCIN T) 1 % lotion Apply topically daily. 10/02/23   [provider]  DULoxetine  (CYMBALTA ) 30 MG capsule Take 30 mg by mouth 2 (two) times daily.    [provider]  Ferrous Sulfate (IRON PO) Take 65 mg by mouth daily.    [provider]  fluticasone  (FLONASE ) 50 MCG/ACT nasal spray SPRAY 2 SPRAYS INTO EACH NOSTRIL EVERY DAY 10/15/23   Iva Marty Saltness, MD  Fluticasone -Umeclidin-Vilant (TRELEGY ELLIPTA ) 200-62.5-25 MCG/ACT AEPB Inhale 1 puff into the lungs in the morning. 10/15/23   Iva Marty Saltness, MD  Fluticasone -Umeclidin-Vilant (TRELEGY ELLIPTA ) 200-62.5-25 MCG/ACT AEPB Inhale 1 puff into the lungs daily in the afternoon. 10/15/23   Iva Marty Saltness, MD  golimumab (SIMPONI ARIA) 50 MG/4ML SOLN injection Inject 50 mg into the vein every 8 (eight) weeks.    [provider]  leflunomide  (ARAVA ) 20 MG tablet Take 20 mg by mouth daily.    [provider]  losartan -hydrochlorothiazide  (HYZAAR) 100-12.5 MG tablet Take 1 tablet by mouth daily. 09/09/21   [provider]  MAGNESIUM  PO Take 400 mg by mouth daily at 6 (six) AM.    [provider]  Multiple Vitamin (MULTIVITAMIN PO) Take by mouth daily at 6 (six) AM.    [provider]  ondansetron  (ZOFRAN ) 4 MG tablet Take 1 tablet (4 mg total) by mouth every 8 (eight) hours as needed  for nausea or vomiting. 04/21/23   Eartha Flavors, Toribio, MD  pantoprazole  (PROTONIX ) 40 MG tablet Take 1 tablet (40 mg total) by mouth daily. 10/15/23   Iva Marty Saltness, MD  potassium chloride  SA (KLOR-CON  M) 20 MEQ tablet Take 1 tablet (20 mEq total) by mouth daily. 01/21/23   Suzette Pac, MD  pregabalin  (LYRICA ) 50 MG capsule Take 50 mg by mouth 2 (two) times daily.    [provider]  RESTASIS  0.05 % ophthalmic emulsion 1 drop 2 (two) times daily. 04/01/23   [provider]  simvastatin  (ZOCOR ) 20 MG tablet Take 20  mg by mouth at bedtime.     [provider]  vitamin B-12 (CYANOCOBALAMIN ) 1000 MCG tablet Take 1 tablet (1,000 mcg total) by mouth daily. 10/24/21   Neomi Johnston DASEN, PA-C    Allergies: Hydromorphone  and Sulfa antibiotics    Review of Systems  Cardiovascular:  Negative for chest pain.  Gastrointestinal:  Positive for nausea. Negative for vomiting.  Neurological:  Positive for dizziness and headaches.    Updated Vital Signs BP 139/73 (BP Location: Right Arm)   Pulse 89   Temp 98.6 F (37 C) (Oral)   Resp 20   Ht 5' 3 (1.6 m)   Wt 68 kg   SpO2 98%   BMI 26.57 kg/m   Physical Exam Vitals and nursing note reviewed.  Constitutional:      General: She is not in acute distress.    Appearance: Normal appearance. She is well-developed.  HENT:     Head: Normocephalic and atraumatic.  Eyes:     Extraocular Movements: Extraocular movements intact.     Conjunctiva/sclera: Conjunctivae normal.     Comments: No nystagmus  Cardiovascular:     Rate and Rhythm: Normal rate and regular rhythm.     Heart sounds: No murmur heard. Pulmonary:     Effort: Pulmonary effort is normal. No respiratory distress.     Breath sounds: Normal breath sounds. No stridor. No wheezing.  Abdominal:     Palpations: Abdomen is soft.     Tenderness: There is no abdominal tenderness. There is no guarding or rebound.  Musculoskeletal:        General: No deformity.     Cervical back: Neck supple.  Skin:    General: Skin is warm and dry.  Neurological:     General: No focal deficit present.     Mental Status: She is alert and oriented to person, place, and time.     GCS: GCS eye subscore is 4. GCS verbal subscore is 5. GCS motor subscore is 6.     Cranial Nerves: No cranial nerve deficit.     Sensory: No sensory deficit.     Motor: No weakness.     (all labs ordered are listed, but only abnormal results are displayed) Labs Reviewed  BASIC METABOLIC PANEL WITH GFR - Abnormal; Notable for the  following components:      Result Value   Potassium 3.4 (*)    BUN 24 (*)    Creatinine, Ser 1.23 (*)    GFR, Estimated 46 (*)    All other components within normal limits  CBC WITH DIFFERENTIAL/PLATELET - Abnormal; Notable for the following components:   WBC 19.7 (*)    Neutro Abs 10.4 (*)    Lymphs Abs 7.9 (*)    Monocytes Absolute 1.4 (*)    All other components within normal limits  BASIC METABOLIC PANEL WITH GFR - Abnormal; Notable for  the following components:   Sodium 146 (*)    Glucose, Bld 104 (*)    Creatinine, Ser 1.10 (*)    GFR, Estimated 52 (*)    All other components within normal limits  CBC - Abnormal; Notable for the following components:   WBC 15.6 (*)    All other components within normal limits    EKG: EKG Interpretation Date/Time:  Friday February 27 2024 17:41:57 EDT Ventricular Rate:  75 PR Interval:  150 QRS Duration:  98 QT Interval:  379 QTC Calculation: 424 R Axis:   32  Text Interpretation: Sinus rhythm Right atrial enlargement Anterolateral infarct, age indeterminate No significant change since prior 6/24 Confirmed by Towana Sharper [id number]) on 02/27/2024 5:48:36 PM  Radiology: MR BRAIN WO CONTRAST Result Date: 02/27/2024 EXAM: MRI BRAIN WITHOUT CONTRAST 02/27/2024 06:18:10 PM TECHNIQUE: Multiplanar multisequence MRI of the head/brain was performed without the administration of intravenous contrast. COMPARISON: CT head dated 03/25/2022 and MRI head dated 04/17/2019. CLINICAL HISTORY: Neuro deficit, acute, stroke suspected. FINDINGS: BRAIN AND VENTRICLES: No acute infarct. No intracranial hemorrhage. No mass. No midline shift. No hydrocephalus. The sella is unremarkable. Normal flow voids. There are nonspecific hyperintense foci in the subcortical and periventricular white matter that most likely represent chronic microangiopathic ischemic changes in a patient of this age. 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 ORBITS: Bilateral lens replacement. SINUSES AND MASTOIDS: Postsurgical changes of the ethmoid sinuses. BONES AND SOFT TISSUES: Normal marrow signal. No acute soft tissue abnormality. IMPRESSION: 1. No acute intracranial abnormality. 2. Chronic microangiopathic ischemic changes. 3. 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 Electronically signed by: Donnice Mania MD 02/27/2024 06:32 PM EDT RP Workstation: [id number]     Procedures   Medications Ordered in the ED  ondansetron  (ZOFRAN ) injection 4 mg (has no administration in time range)  sodium chloride  0.9 % bolus 500 mL (has no administration in time range)  LORazepam  (ATIVAN ) injection 1 mg (has no administration in time range)  meclizine  (ANTIVERT ) tablet 12.5 mg (has no administration in time range)    Clinical Course as of 02/28/24 1022  Fri Feb 27, 2024  1745 I reviewed side effects of the Cimzia.,  In our flulike symptoms and rashes but it can cause CNS problems such as dizziness vision changes numbness weakness headache and cause or worsen conditions such as MS Guillain-Barr optic neuritis [MB]  2026 Discussed with [facility] hospitalist Dr. Noralee who will evaluate patient for admission. [MB]    Clinical Course User Index [MB] Towana Sharper BROCKS, MD                                 Medical Decision Making Amount and/or Complexity of Data Reviewed Labs: ordered. Radiology: ordered.  Risk Prescription drug management. Decision regarding hospitalization.   This patient complains of dizziness blurry vision; this involves an extensive number of treatment Options and is a complaint that carries with it a high risk of complications and morbidity. The differential includes stroke, bleed, vertigo, tumor, seizures, medication side effect  I ordered, reviewed and interpreted labs, which included CBC with elevated white count, chemistries unremarkable I ordered medication IV fluids and nausea  medication, Ativan  and meclizine  and reviewed PMP when indicated. I ordered imaging studies which included MRI brain and I independently    visualized and interpreted imaging which showed no acute findings Additional history obtained  from patient's companion Previous records obtained and reviewed in epic including recent rheumatology notes I consulted [facility] hospitalist Dr. Noralee and discussed lab and imaging findings and discussed disposition.  Cardiac monitoring reviewed, sinus rhythm Social determinants considered, no significant barriers Critical Interventions: None  After the interventions stated above, I reevaluated the patient and found patient to be feeling little bit better but still symptomatic when she tries to stand Admission and further testing considered, she is unable to be safely discharged at this time.  She is agreeable to admission for further management of her symptoms.      Final diagnoses:  Dizziness    ED Discharge Orders     None          Towana Ozell BROCKS, MD 02/28/24 1025

## 2024-02-27 NOTE — Assessment & Plan Note (Signed)
 Continue statin therapy.

## 2024-02-27 NOTE — Assessment & Plan Note (Addendum)
 Add 40 meq Kcl po x 2 doses and follow up renal function and electrolytes in am, including Mg.  Add balanced electrolyte solutions with dextrose  at 75 ml per for hydration  Hold on hydrochlorothiazide 

## 2024-02-27 NOTE — Assessment & Plan Note (Signed)
 Continue blood pressure control with amlodipine , and losartan  Hold on diuretic therapy for now while getting IV fluids.

## 2024-02-27 NOTE — Assessment & Plan Note (Addendum)
 Persistent dis-balance, not able to ambulate  Plan to continue neuro checks q 4 hrs Considering her vascular atherosclerotic disease and prior remote lacunar infarcts, may need further work up with MRA, to rule out acute ischemic event, in the posterior circulation.   Add aspirin  and continue statin Hold pregabalin   Supportive medical therapy with IV fluids.  Consult neurology, PT and OT

## 2024-02-27 NOTE — Assessment & Plan Note (Addendum)
No signs of acute exacerbation. Continue bronchodilator therapy.

## 2024-02-27 NOTE — ED Notes (Signed)
 Pt placed on bedpan

## 2024-02-27 NOTE — ED Notes (Signed)
 Pt unable to ambulate due to increased dizziness

## 2024-02-27 NOTE — Assessment & Plan Note (Addendum)
 Continue with duloxetine    RA continue with leflunomide.

## 2024-02-27 NOTE — H&P (Addendum)
 History and Physical    Patient: Betty Golden FMW:[id number] DOB: [birth year] DOA: 02/27/2024 DOS: the patient was seen and examined on 02/27/2024 PCP: Irven Ozell DEL, MD  Patient coming from: Home  Chief Complaint:  Chief Complaint  Patient presents with   Blurred Vision   Dizziness   HPI: Betty Golden is a 78 y.o. female with medical history significant of asthma, chronic neck pain, coronary artery disease, hypertension, GERD, hyperlipidemia, depression and fibromyalgia who presented with dizziness.  Acute onset of dizziness, symptoms occurred after standing up from a seating position. Her dizziness is described as poor balance, moderate to severe in intensity, no spinning sensation. Associated with nausea but not vomiting. Her symptoms have been constant, and incapacitating to the point where she is not able to ambulate.  Her vision has been blurry with no scotomas or double vision. Because of intensity of her symptoms she was brought to he hospital for further evaluation.   She has been started on benralizumab  injections for fibromyalgia, every 2 weeks. She received her second dose on 02/24/24.   At the time of my examination she has received lorazepam , IV fluids and meclizine with no significant improvement in her symptoms.     Review of Systems: As mentioned in the history of present illness. All other systems reviewed and are negative. Past Medical History:  Diagnosis Date   Asthma    C. difficile colitis    Cervical stenosis of spine    Chronic headaches    Chronic neck pain    Coronary artery disease    Depression    Diverticulosis    Essential hypertension    Fibromyalgia    GERD (gastroesophageal reflux disease)    History of Salmonella gastroenteritis    HNP (herniated nucleus pulposus), lumbar    Hyperlipidemia    IBS (irritable bowel syndrome)    Lung nodule    rheumatoid arthritis    Past Surgical History:  Procedure Laterality Date   APPENDECTOMY   1965   BIOPSY  08/26/2018   Procedure: BIOPSY;  Surgeon: Golda Claudis PENNER, MD;  Location: [facility];  Service: Endoscopy;;  gastric   BREAST BIOPSY     BREAST REDUCTION SURGERY  1994   CATARACT EXTRACTION W/PHACO Left 06/09/2023   Procedure: CATARACT EXTRACTION PHACO AND INTRAOCULAR LENS PLACEMENT (IOC);  Surgeon: Harrie Agent, MD;  Location: [facility];  Service: Ophthalmology;  Laterality: Left;  CDE 7.03   CATARACT EXTRACTION W/PHACO Right 06/23/2023   Procedure: CATARACT EXTRACTION PHACO AND INTRAOCULAR LENS PLACEMENT (IOC);  Surgeon: Harrie Agent, MD;  Location: [facility];  Service: Ophthalmology;  Laterality: Right;  CDE: 11.55   CHOLECYSTECTOMY  1975   COLONOSCOPY N/A 08/19/2013   Procedure: COLONOSCOPY;  Surgeon: Claudis PENNER Golda, MD;  Location: [facility];  Service: Endoscopy;  Laterality: N/A;  155-moved to 140 Ann to notify pt   COLONOSCOPY N/A 08/26/2018   Procedure: COLONOSCOPY;  Surgeon: Golda Claudis PENNER, MD;  Location: [facility];  Service: Endoscopy;  Laterality: N/A;   ESOPHAGOGASTRODUODENOSCOPY N/A 08/26/2018   Procedure: ESOPHAGOGASTRODUODENOSCOPY (EGD);  Surgeon: Golda Claudis PENNER, MD;  Location: [facility];  Service: Endoscopy;  Laterality: N/A;   fibromyalgia     LUMBAR LAMINECTOMY/DECOMPRESSION MICRODISCECTOMY Bilateral 09/20/2015   Procedure: MICRO LUMBAR DECOMPRESSION L5-S1 BILATERAL    (1 LEVEL);  Surgeon: Reyes Billing, MD;  Location: [facility];  Service: Orthopedics;  Laterality: Bilateral;   rheumatoid arthritis     Sinus Surgery  TONSILLECTOMY  1968   Social History:  reports that she quit smoking about 35 years ago. Her smoking use included cigarettes. She started smoking about 45 years ago. She has a 20 pack-year smoking history. She has never used smokeless tobacco. She reports that she does not drink alcohol and does not use drugs.  Allergies  Allergen Reactions   Hydromorphone  Anaphylaxis   Sulfa Antibiotics Rash    Family History  Problem  Relation Age of Onset   Heart disease Mother    Hypertension Mother    Asthma Mother    Heart disease Father    Stroke Father    Hypertension Father    Stroke Sister    Asthma Sister    Hypertension Brother    Asthma Brother    Asthma Maternal Grandmother    Heart disease Maternal Grandmother    Diabetes Maternal Grandfather    Asthma Maternal Aunt    Colon cancer Neg Hx     Prior to Admission medications   Medication Sig Start Date End Date Taking? Authorizing Provider  albuterol  (PROAIR  HFA) 108 (90 Base) MCG/ACT inhaler Inhale 2 puffs into the lungs every 4 (four) hours as needed for wheezing or shortness of breath. 04/16/23  Yes Iva Marty Saltness, MD  amLODipine  (NORVASC ) 10 MG tablet Take 10 mg by mouth daily. 01/23/23  Yes [provider]  atorvastatin  (LIPITOR) 10 MG tablet Take 10 mg by mouth daily. 03/16/23  Yes [provider]  benralizumab  (FASENRA ) 30 MG/ML prefilled syringe Inject 1 mL (30 mg total) into the skin every 8 (eight) weeks. 07/31/23  Yes Iva Marty Saltness, MD  CALCIUM  PO Take 1,250 mg by mouth.   Yes [provider]  Certolizumab Pegol (CIMZIA Craig) Inject 1 Dose into the skin every 14 (fourteen) days.   Yes [provider]  Cetirizine HCl (ZYRTEC PO) Take 10 mg by mouth daily at 6 (six) AM.   Yes [provider]  DULoxetine  (CYMBALTA ) 30 MG capsule Take 30 mg by mouth 2 (two) times daily.   Yes [provider]  Ferrous Sulfate (IRON PO) Take 65 mg by mouth daily.   Yes [provider]  fluticasone  (FLONASE ) 50 MCG/ACT nasal spray SPRAY 2 SPRAYS INTO EACH NOSTRIL EVERY DAY 10/15/23  Yes Iva Marty Saltness, MD  Fluticasone -Umeclidin-Vilant (TRELEGY ELLIPTA ) 200-62.5-25 MCG/ACT AEPB Inhale 1 puff into the lungs in the morning. 10/15/23  Yes Iva Marty Saltness, MD  leflunomide (ARAVA) 20 MG tablet Take 20 mg by mouth daily.   Yes [provider]  losartan -hydrochlorothiazide  (HYZAAR)  100-12.5 MG tablet Take 1 tablet by mouth daily. 09/09/21  Yes [provider]  MAGNESIUM  PO Take 400 mg by mouth daily at 6 (six) AM.   Yes [provider]  Multiple Vitamin (MULTIVITAMIN PO) Take 1 tablet by mouth daily at 6 (six) AM.   Yes [provider]  ondansetron  (ZOFRAN ) 4 MG tablet Take 1 tablet (4 mg total) by mouth every 8 (eight) hours as needed for nausea or vomiting. 04/21/23  Yes Eartha Flavors, Toribio, MD  pantoprazole  (PROTONIX ) 40 MG tablet Take 1 tablet (40 mg total) by mouth daily. 10/15/23  Yes Iva Marty Saltness, MD  potassium chloride  SA (KLOR-CON  M) 20 MEQ tablet Take 1 tablet (20 mEq total) by mouth daily. 01/21/23  Yes Suzette Pac, MD  pregabalin  (LYRICA ) 50 MG capsule Take 50 mg by mouth 2 (two) times daily.   Yes [provider]  RESTASIS 0.05 % ophthalmic emulsion  Place 1 drop into both eyes 2 (two) times daily. 04/01/23  Yes [provider]  vitamin B-12 (CYANOCOBALAMIN ) 1000 MCG tablet Take 1 tablet (1,000 mcg total) by mouth daily. 10/24/21  Yes Neomi Johnston DASEN, PA-C    Physical Exam: Vitals:   02/27/24 2045 02/27/24 2046 02/27/24 2100 02/27/24 2120  BP: (!) 168/77  (!) 160/84   Pulse: 86 86 83   Resp:  11 17   Temp:    97.8 F (36.6 C)  TempSrc:    Oral  SpO2: 97% 98% 96%   Weight:      Height:       BP (!) 166/86   Pulse 87   Temp 97.8 F (36.6 C) (Oral)   Resp 11   Ht 5' 3 (1.6 m)   Wt 68 kg   SpO2 96%   BMI 26.57 kg/m   Neurology awake and alert, cranial nerves 2 to 12 preserved, noted horizontal nystagmus, her strength is decreased all 4 extremities, proximal and distal 4/5  ENT with mild pallor, oral mucosa moist Cardiovascular with S1 and S2 present and regular, no gallops or rubs, positive systolic murmur at the apex Abdomen with no distention  No lower extremity edema   Data Reviewed:   Na 145, K 3.4 C; 107 bicarbonate 25 glucose 77 bun 24 cr 1.24 Wbc 19.7 hgb 12.9 plt 253   Brain  MRI with no acute intracranial abnormality  Chronic microangiopathic ischemic changes.  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   EKG 75 bpm, normal axis, normal intervals, qtc 98, sinus rhythm with left atrial enlargement, q wave lead II, III avF, no significant ST segment or T wave changes.   Assessment and Plan: * Dizziness Persistent dis-balance, not able to ambulate  Plan to continue neuro checks q 4 hrs Considering her vascular atherosclerotic disease and prior remote lacunar infarcts, may need further work up with MRA, to rule out acute ischemic event, in the posterior circulation.   Add aspirin  and continue statin Hold pregabalin   Supportive medical therapy with IV fluids.  Consult neurology, PT and OT  Essential hypertension Continue blood pressure control with amlodipine , and losartan  Hold on diuretic therapy for now while getting IV fluids.   Hypokalemia Add 40 meq Kcl po x 2 doses and follow up renal function and electrolytes in am, including Mg.  Add balanced electrolyte solutions with dextrose  at 75 ml per for hydration  Hold on hydrochlorothiazide    COPD (chronic obstructive pulmonary disease) (HCC) No signs of acute exacerbation Continue bronchodilator therapy    Fibromyalgia Continue with duloxetine    RA continue with leflunomide.   Dyslipidemia Continue statin therapy.     Advance Care Planning:   Code Status: Full Code   Consults: consult Neurology   Family Communication: I spoke with patient's daughter at the bedside, we talked in detail about patient's condition, plan of care and prognosis and all questions were addressed.   Severity of Illness: The appropriate patient status for this patient is OBSERVATION. Observation status is judged to be reasonable and necessary in order to provide the required intensity of service to ensure the patient's safety. The patient's presenting symptoms, physical exam findings,  and initial radiographic and laboratory data in the context of their medical condition is felt to place them at decreased risk for further clinical deterioration. Furthermore, it is anticipated that the patient will be medically stable for discharge from the hospital within 2 midnights of admission.  Author: Elidia Toribio Furnace, MD 02/27/2024 9:26 PM  For on call review [email or web address].

## 2024-02-27 NOTE — ED Triage Notes (Signed)
 Pt comes in with vertigo, nausea, h/a and blurry vision. S/s started when pt woke up. Pt has started new injections for fibromyalgia on 02/10/2024. Pt is on her 2nd dose (02/24/2024). Pt took 2 tylenol  for h/a. Tried to complete visual acuty test in triage but pt was unable to see the chart using both eyes. Caregiver in triage believes pt is having a flare up from the fibromyalgia.

## 2024-02-28 ENCOUNTER — Observation Stay (HOSPITAL_COMMUNITY)

## 2024-02-28 ENCOUNTER — Encounter (HOSPITAL_COMMUNITY): Payer: Self-pay | Admitting: Internal Medicine

## 2024-02-28 ENCOUNTER — Other Ambulatory Visit: Payer: Self-pay

## 2024-02-28 DIAGNOSIS — R42 Dizziness and giddiness: Secondary | ICD-10-CM | POA: Diagnosis not present

## 2024-02-28 LAB — CBC
HCT: 39.2 % (ref 36.0–46.0)
Hemoglobin: 12.5 g/dL (ref 12.0–15.0)
MCH: 28.3 pg (ref 26.0–34.0)
MCHC: 31.9 g/dL (ref 30.0–36.0)
MCV: 88.9 fL (ref 80.0–100.0)
Platelets: 241 K/uL (ref 150–400)
RBC: 4.41 MIL/uL (ref 3.87–5.11)
RDW: 15 % (ref 11.5–15.5)
WBC: 15.6 K/uL — ABNORMAL HIGH (ref 4.0–10.5)
nRBC: 0 % (ref 0.0–0.2)

## 2024-02-28 LAB — BASIC METABOLIC PANEL WITH GFR
Anion gap: 11 (ref 5–15)
BUN: 20 mg/dL (ref 8–23)
CO2: 24 mmol/L (ref 22–32)
Calcium: 9.4 mg/dL (ref 8.9–10.3)
Chloride: 111 mmol/L (ref 98–111)
Creatinine, Ser: 1.1 mg/dL — ABNORMAL HIGH (ref 0.44–1.00)
GFR, Estimated: 52 mL/min — ABNORMAL LOW (ref >60)
Glucose, Bld: 104 mg/dL — ABNORMAL HIGH (ref 70–99)
Potassium: 4.3 mmol/L (ref 3.5–5.1)
Sodium: 146 mmol/L — ABNORMAL HIGH (ref 135–145)

## 2024-02-28 MED ORDER — LORAZEPAM 2 MG/ML IJ SOLN
1.0000 mg | Freq: Once | INTRAMUSCULAR | Status: DC
Start: 2024-02-28 — End: 2024-02-28

## 2024-02-28 MED ORDER — LORAZEPAM 1 MG PO TABS
1.0000 mg | ORAL_TABLET | Freq: Once | ORAL | Status: AC
  Administered 2024-02-28: 1 mg via ORAL
  Filled 2024-02-28: qty 1

## 2024-02-28 MED ORDER — MECLIZINE HCL 12.5 MG PO TABS: 12.5000 mg | ORAL_TABLET | Freq: Three times a day (TID) | ORAL | 0 refills | Status: DC | PRN

## 2024-02-28 MED ORDER — PREGABALIN 50 MG PO CAPS
50.0000 mg | ORAL_CAPSULE | Freq: Two times a day (BID) | ORAL | Status: DC
  Administered 2024-02-28 (×2): 50 mg via ORAL
  Filled 2024-02-28 (×2): qty 1

## 2024-02-28 NOTE — Plan of Care (Signed)

## 2024-02-28 NOTE — Progress Notes (Signed)
 Patient admitted to room 314 at this time. Pts daughter is at bedside and is active and in tune with the patients care. Pt denies pain. Skin intact. Assessment completed with Nurse Jamie as a secondary witness. Pts daughter is very helpful in providing information. Pt still c/o's blurred vision but denies dizziness. Pt is able to tell this writer how many fingers this writer holds up correctly upon check. Neuro assessments conts every 4 hours. Call light within reach.

## 2024-02-28 NOTE — TOC Transition Note (Signed)
 Transition of Care [facility]) - Discharge Note   Patient Details  Name: Betty Golden MRN: [id number] Date of Birth: [birth year]  Transition of Care [facility]) CM/SW Contact:  Noreen KATHEE Cleotilde ISRAEL Phone Number: 02/28/2024, 10:09 AM   Clinical Narrative:     CSW spoke with daughter who is at bedside regarding PT recommendation for OPPT. CSW informed daughter that patient can do HH or OP . Patient agreed to [facility]. No HH agency preference . Cory with [facility] accept referral. [name] asked if her number could be provided to [facility] and Darleene was provided with number. TOC signing off.   Final next level of care: Home w Home Health Services Barriers to Discharge: Barriers Resolved   Patient Goals and CMS Choice Patient states their goals for this hospitalization and ongoing recovery are:: return back home CMS Medicare.gov Compare Post Acute Care list provided to:: Patient Represenative (must comment) (Daughter [name]) Choice offered to / list presented to : Adult Children      Discharge Placement                Patient to be transferred to facility by: POV Name of family member notified: [name]- daughter Patient and family notified of of transfer: 02/28/24  Discharge Plan and Services Additional resources added to the After Visit Summary for                            [facility] Arranged: PT [facility] Agency: [facility] Date [facility] Agency Contacted: 02/28/24 Time HH Agency Contacted: 1008 Representative spoke with at [facility] Agency: Darleene  Social Drivers of Health (SDOH) Interventions SDOH Screenings   Food Insecurity: No Food Insecurity (02/27/2024)  Housing: Low Risk  (02/27/2024)  Transportation Needs: No Transportation Needs (02/27/2024)  Utilities: Not At Risk (02/27/2024)  Financial Resource Strain: Low Risk  (04/04/2019)  Physical Activity: Inactive (04/04/2019)  Social Connections: Unknown (02/27/2024)  Recent Concern: Social Connections - Socially Isolated (02/27/2024)  Stress: No Stress Concern  Present (04/04/2019)  Tobacco Use: Medium Risk (02/28/2024)     Readmission Risk Interventions     No data to display

## 2024-02-28 NOTE — Progress Notes (Signed)
 This Clinical research associate entered patients room and pt was c/o of not being able to void. Bladder scanned patient and noted 1000cc of urine to be in bladder. Upon this writer opening up in and out cath kit, pt stated Ive got to pee put that purewick back. Patient voided of urine at that time. Pt stated she felt relieved. Abdomen is now soft and nontender. Urine clear. Pt stated her IV was sore and this writer assessed IV site and had to remove IV due to swelling at the IV site. Pt has very poor venous access and this writer did attempt x1 to place a new IV and did get blood return, but IV blew when this writer tried to push in saline. No success with establishing IV access. Reported to charge nurse as this Clinical research associate thinks the patient will need an ultrasound guided IV due to poor venous access and pt stated that she has had to get ultrasound placed IV's in the past. Before this writer left the room the patient had voided another 200cc of urine into canister at bedside. Will report to oncoming shift.

## 2024-02-28 NOTE — Evaluation (Signed)
 Physical Therapy Evaluation Patient Details Name: Betty Golden MRN: [id number] DOB: [birth year] Today's Date: 02/28/2024  History of Present Illness  Betty Golden is a 78 y.o. female with medical history significant of asthma, chronic neck pain, coronary artery disease, hypertension, GERD, hyperlipidemia, depression and fibromyalgia who presented with dizziness.   Acute onset of dizziness, symptoms occurred after standing up from a seating position. Her dizziness is described as poor balance, moderate to severe in intensity, no spinning sensation. Associated with nausea but not vomiting. Her symptoms have been constant, and incapacitating to the point where she is not able to ambulate.   Her vision has been blurry with no scotomas or double vision. Because of intensity of her symptoms she was brought to he hospital for further evaluation.   Clinical Impression  Patient demonstrates decreased LE strength, headache, and impaired balance. Patient also demonstrates difficulty with ambulation during today's session with decreased stride length and velocity noted. Pt does demonstrate modified independence with bed mobility, functional transfers, and ambulation with handrails on bed and RW for ambulation. Patient also demonstrates no LOB or dizziness spells during evaluation today, pt reports she is feeling much better than yesterday. Patient does demonstrate elevated BP values this morning (supine 168/100; seated 176/89) but no increased dizziness reported. Patient discharged from physical therapy to care of nursing for ambulation daily as tolerated for length of stay and receive all other therapy services at next venue of care.          If plan is discharge home, recommend the following: A little help with walking and/or transfers;A little help with bathing/dressing/bathroom;Assist for transportation   Can travel by private vehicle        Equipment Recommendations None recommended by PT   Recommendations for Other Services       Functional Status Assessment Patient has had a recent decline in their functional status and demonstrates the ability to make significant improvements in function in a reasonable and predictable amount of time.     Precautions / Restrictions Precautions Precautions: Fall Recall of Precautions/Restrictions: Intact Restrictions Weight Bearing Restrictions Per Provider Order: No      Mobility  Bed Mobility Overal bed mobility: Modified Independent             General bed mobility comments: pt has grab bars at bed at home    Transfers Overall transfer level: Modified independent Equipment used: Rolling walker (2 wheels)                    Ambulation/Gait Ambulation/Gait assistance: Modified independent (Device/Increase time) Gait Distance (Feet): 100 Feet Assistive device: Rolling walker (2 wheels) Gait Pattern/deviations: WFL(Within Functional Limits), Decreased stride length Gait velocity: decreased     General Gait Details: pt demonstrates dependence on AD for balance, no dizziness noted with turning during ambulation today  Stairs            Wheelchair Mobility     Tilt Bed    Modified Rankin (Stroke Patients Only)       Balance Overall balance assessment: Modified Independent                                           Pertinent Vitals/Pain Pain Assessment Pain Assessment: 0-10 Pain Score: 6  Pain Location: headache Pain Descriptors / Indicators: Aching Pain Intervention(s): Limited activity within patient's tolerance    Home  Living Family/patient expects to be discharged to:: Private residence Living Arrangements: Children Available Help at Discharge: Family;Available 24 hours/day Type of Home: House Home Access: Ramped entrance       Home Layout: Two level;Able to live on main level with bedroom/bathroom;Full bath on main level Home Equipment: Rolling Walker (2  wheels);Wheelchair - manual;Cane - single point;BSC/3in1      Prior Function Prior Level of Function : Needs assist             Mobility Comments: household ambulator with no AD, community ambulator with [facility] ADLs Comments: assited by family     Extremity/Trunk Assessment   Upper Extremity Assessment Upper Extremity Assessment: Defer to OT evaluation;Overall WFL for tasks assessed    Lower Extremity Assessment Lower Extremity Assessment: Overall WFL for tasks assessed;Generalized weakness    Cervical / Trunk Assessment Cervical / Trunk Assessment: Normal  Communication   Communication Communication: No apparent difficulties    Cognition Arousal: Alert Behavior During Therapy: WFL for tasks assessed/performed   PT - Cognitive impairments: No apparent impairments                         Following commands: Intact       Cueing Cueing Techniques: Verbal cues, Visual cues     General Comments      Exercises     Assessment/Plan    PT Assessment All further PT needs can be met in the next venue of care  PT Problem List Decreased strength;Decreased balance;Decreased mobility;Decreased activity tolerance;Pain       PT Treatment Interventions      PT Goals (Current goals can be found in the Care Plan section)  Acute Rehab PT Goals Patient Stated Goal: to increase overall strength and return home PT Goal Formulation: With patient/family Time For Goal Achievement: 03/06/24 Potential to Achieve Goals: Good    Frequency  1 time visit     Co-evaluation               AM-PAC PT 6 Clicks Mobility  Outcome Measure Help needed turning from your back to your side while in a flat bed without using bedrails?: A Little Help needed moving from lying on your back to sitting on the side of a flat bed without using bedrails?: A Little Help needed moving to and from a bed to a chair (including a wheelchair)?: A Little Help needed standing up from a  chair using your arms (e.g., wheelchair or bedside chair)?: None Help needed to walk in hospital room?: A Little Help needed climbing 3-5 steps with a railing? : A Lot 6 Click Score: 18    End of Session Equipment Utilized During Treatment: Gait belt Activity Tolerance: Patient tolerated treatment well Patient left: in chair;with call bell/phone within reach;with family/visitor present Nurse Communication: Mobility status PT Visit Diagnosis: Other abnormalities of gait and mobility (R26.89);Muscle weakness (generalized) (M62.81)    Time: 9246-9173 PT Time Calculation (min) (ACUTE ONLY): 33 min   Charges:   PT Evaluation $PT Eval Low Complexity: 1 Low PT Treatments $Therapeutic Activity: 8-22 mins PT General Charges $$ ACUTE PT VISIT: 1 Visit         Lang Ada, PT, DPT [facility] Office: [phone] 9:28 AM, 02/28/24

## 2024-02-28 NOTE — Discharge Summary (Signed)
 Physician Discharge Summary  Betty Golden FMW:[id number] DOB: [birth year] DOA: 02/27/2024  PCP: Irven Ozell DEL, MD  Admit date: 02/27/2024  Discharge date: 02/28/2024  Admitted From:Home  Disposition:  Home  Recommendations for Outpatient Follow-up:  Follow up with PCP in 1-2 weeks Remain in home medications as prior Use meclizine  as needed for any repeat episodes of dizziness  Home Health:Yes with PT  Equipment/Devices:None  Discharge Condition:Stable  CODE STATUS: Full  Diet recommendation: Heart Healthy  Brief/Interim Summary: Betty Golden is a 77 y.o. female with medical history significant of asthma, chronic neck pain, coronary artery disease, hypertension, GERD, hyperlipidemia, depression and fibromyalgia who presented with dizziness.  Patient was admitted for evaluation and brain MRI was negative for any acute findings.  MRA brain subsequently without any significant findings noted either.  She was seen by PT with recommendations for outpatient PT, but prefers home health services which will be arranged.  Discharge Diagnoses:  Principal Problem:   Dizziness Active Problems:   Essential hypertension   Hypokalemia   COPD (chronic obstructive pulmonary disease) (HCC)   Fibromyalgia   Dyslipidemia  Principal discharge diagnosis: Transient dizziness likely related to benign vertigo.  Discharge Instructions  Discharge Instructions     Diet - low sodium heart healthy   Complete by: As directed    Increase activity slowly   Complete by: As directed       Allergies as of 02/28/2024       Reactions   Hydromorphone  Anaphylaxis   Sulfa Antibiotics Rash        Medication List     TAKE these medications    albuterol  108 (90 Base) MCG/ACT inhaler Commonly known as: ProAir  HFA Inhale 2 puffs into the lungs every 4 (four) hours as needed for wheezing or shortness of breath.   amLODipine  10 MG tablet Commonly known as: NORVASC  Take 10 mg by mouth  daily.   atorvastatin  10 MG tablet Commonly known as: LIPITOR Take 10 mg by mouth daily.   CALCIUM  PO Take 1,250 mg by mouth.   CIMZIA Mize Inject 1 Dose into the skin every 14 (fourteen) days.   cyanocobalamin  1000 MCG tablet Commonly known as: VITAMIN B12 Take 1 tablet (1,000 mcg total) by mouth daily.   DULoxetine  30 MG capsule Commonly known as: CYMBALTA  Take 30 mg by mouth 2 (two) times daily.   Fasenra  30 MG/ML prefilled syringe Generic drug: benralizumab  Inject 1 mL (30 mg total) into the skin every 8 (eight) weeks.   fluticasone  50 MCG/ACT nasal spray Commonly known as: FLONASE  SPRAY 2 SPRAYS INTO EACH NOSTRIL EVERY DAY   IRON PO Take 65 mg by mouth daily.   leflunomide  20 MG tablet Commonly known as: ARAVA  Take 20 mg by mouth daily.   losartan -hydrochlorothiazide  100-12.5 MG tablet Commonly known as: HYZAAR Take 1 tablet by mouth daily.   MAGNESIUM  PO Take 400 mg by mouth daily at 6 (six) AM.   meclizine  12.5 MG tablet Commonly known as: ANTIVERT  Take 1 tablet (12.5 mg total) by mouth 3 (three) times daily as needed for dizziness.   MULTIVITAMIN PO Take 1 tablet by mouth daily at 6 (six) AM.   ondansetron  4 MG tablet Commonly known as: ZOFRAN  Take 1 tablet (4 mg total) by mouth every 8 (eight) hours as needed for nausea or vomiting.   pantoprazole  40 MG tablet Commonly known as: PROTONIX  Take 1 tablet (40 mg total) by mouth daily.   potassium chloride  SA 20 MEQ tablet Commonly known  as: KLOR-CON  M Take 1 tablet (20 mEq total) by mouth daily.   pregabalin  50 MG capsule Commonly known as: LYRICA  Take 50 mg by mouth 2 (two) times daily.   Restasis  0.05 % ophthalmic emulsion Generic drug: cycloSPORINE  Place 1 drop into both eyes 2 (two) times daily.   Trelegy Ellipta  200-62.5-25 MCG/ACT Aepb Generic drug: Fluticasone -Umeclidin-Vilant Inhale 1 puff into the lungs in the morning.   ZYRTEC PO Take 10 mg by mouth daily at 6 (six) AM.         Follow-up Information     [facility] Follow up.   Why: HHPT will call to schedule first home visit.        Irven Ozell DEL, MD. Schedule an appointment as soon as possible for a visit in 1 week(s).   Specialty: Family Medicine Contact information: [address] [place] [address] [phone]                Allergies  Allergen Reactions   Hydromorphone  Anaphylaxis   Sulfa Antibiotics Rash    Consultations: None   Procedures/Studies: MR ANGIO HEAD WO CONTRAST Result Date: 02/28/2024 EXAM: MR Angiography Head without intravenous Contrast. 02/28/2024 11:51:00 AM TECHNIQUE: Magnetic resonance angiography images of the head without intravenous contrast. Multiplanar 2D and 3D reformatted images are provided for review. COMPARISON: MRI of the head dated 02/27/2024. CLINICAL HISTORY: Vertigo, central; dizziness. 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 Mild-to-moderate diffuse stenosis of the A1 segments bilaterally. No flow-limiting stenosis or large vessel occlusion demonstrated. POSTERIOR CIRCULATION: No significant stenosis of the posterior cerebral arteries. No significant stenosis of the basilar artery. No significant stenosis of the vertebral arteries. No aneurysm. IMPRESSION: 1. 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 2. No flow-limiting stenosis or large vessel occlusion demonstrated. Electronically signed by: evalene coho 02/28/2024 12:01 PM EDT RP Workstation: [id number]   MR BRAIN WO CONTRAST Result Date: 02/27/2024 EXAM: MRI BRAIN WITHOUT CONTRAST 02/27/2024 06:18:10 PM TECHNIQUE: Multiplanar multisequence MRI of the head/brain was performed without the administration of intravenous contrast. COMPARISON: CT head dated 03/25/2022  and MRI head dated 04/17/2019. CLINICAL HISTORY: Neuro deficit, acute, stroke suspected. FINDINGS: BRAIN AND VENTRICLES: No acute infarct. No intracranial hemorrhage. No mass. No midline shift. No hydrocephalus. The sella is unremarkable. Normal flow voids. There are nonspecific hyperintense foci in the subcortical and periventricular white matter that most likely represent chronic microangiopathic ischemic changes in a patient of this age. 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 ORBITS: Bilateral lens replacement. SINUSES AND MASTOIDS: Postsurgical changes of the ethmoid sinuses. BONES AND SOFT TISSUES: Normal marrow signal. No acute soft tissue abnormality. IMPRESSION: 1. No acute intracranial abnormality. 2. Chronic microangiopathic ischemic changes. 3. 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 Electronically signed by: Donnice Mania MD 02/27/2024 06:32 PM EDT RP Workstation: [id number]     Discharge Exam: Vitals:   02/28/24 0915 02/28/24 0948  BP: (!) 176/89   Pulse: 99   Resp:    Temp:    SpO2:  97%   Vitals:   02/27/24 2300 02/28/24 0223 02/28/24 0915 02/28/24 0948  BP: (!) 145/74 (!) 152/82 (!) 176/89   Pulse:  96 99   Resp:  20    Temp:  98.7 F (37.1 C)  TempSrc:  Oral    SpO2:  98%  97%  Weight:      Height:        General: Pt is alert, awake, not in acute distress Cardiovascular: RRR, S1/S2 +, no rubs, no gallops Respiratory: CTA bilaterally, no wheezing, no rhonchi Abdominal: Soft, NT, ND, bowel sounds + Extremities: no edema, no cyanosis    The results of significant diagnostics from this hospitalization (including imaging, microbiology, ancillary and laboratory) are listed below for reference.     Microbiology: No results found for this or any previous visit (from the past 240 hours).   Labs: BNP (last 3 results) No results for input(s): BNP in the last 8760 hours. Basic  Metabolic Panel: Recent Labs  Lab 02/27/24 1745 02/28/24 0359  NA 145 146*  K 3.4* 4.3  CL 107 111  CO2 25 24  GLUCOSE 77 104*  BUN 24* 20  CREATININE 1.23* 1.10*  CALCIUM  9.4 9.4   Liver Function Tests: No results for input(s): AST, ALT, ALKPHOS, BILITOT, PROT, ALBUMIN in the last 168 hours. No results for input(s): LIPASE, AMYLASE in the last 168 hours. No results for input(s): AMMONIA in the last 168 hours. CBC: Recent Labs  Lab 02/27/24 1745 02/28/24 0359  WBC 19.7* 15.6*  NEUTROABS 10.4*  --   HGB 12.9 12.5  HCT 38.7 39.2  MCV 86.4 88.9  PLT 253 241   Cardiac Enzymes: No results for input(s): CKTOTAL, CKMB, CKMBINDEX, TROPONINI in the last 168 hours. BNP: Invalid input(s): POCBNP CBG: No results for input(s): GLUCAP in the last 168 hours. D-Dimer No results for input(s): DDIMER in the last 72 hours. Hgb A1c No results for input(s): HGBA1C in the last 72 hours. Lipid Profile No results for input(s): CHOL, HDL, LDLCALC, TRIG, CHOLHDL, LDLDIRECT in the last 72 hours. Thyroid function studies No results for input(s): TSH, T4TOTAL, T3FREE, THYROIDAB in the last 72 hours.  Invalid input(s): FREET3 Anemia work up No results for input(s): VITAMINB12, FOLATE, FERRITIN, TIBC, IRON, RETICCTPCT in the last 72 hours. Urinalysis    Component Value Date/Time   COLORURINE YELLOW 09/26/2021 1208   APPEARANCEUR Clear 01/18/2022 1311   LABSPEC 1.010 09/26/2021 1208   PHURINE 5.0 09/26/2021 1208   GLUCOSEU Negative 01/18/2022 1311   HGBUR SMALL (A) 09/26/2021 1208   BILIRUBINUR Negative 01/18/2022 1311   KETONESUR NEGATIVE 09/26/2021 1208   PROTEINUR Negative 01/18/2022 1311   PROTEINUR 30 (A) 09/26/2021 1208   UROBILINOGEN 0.2 03/09/2015 1035   NITRITE Negative 01/18/2022 1311   NITRITE NEGATIVE 09/26/2021 1208   LEUKOCYTESUR Trace (A) 01/18/2022 1311   LEUKOCYTESUR LARGE (A) 09/26/2021 1208    Sepsis Labs Recent Labs  Lab 02/27/24 1745 02/28/24 0359  WBC 19.7* 15.6*   Microbiology No results found for this or any previous visit (from the past 240 hours).   Time coordinating discharge: 35 minutes  SIGNED:   Adron JONETTA Fairly, DO [facility] 02/28/2024, 12:08 PM  If 7PM-7AM, please contact night-coverage [email or web address]

## 2024-02-28 NOTE — Progress Notes (Signed)
 9160 Patient eating breakfast, unavailable for DPI at this time.

## 2024-03-03 MED ORDER — PANTOPRAZOLE SODIUM 40 MG PO TBEC: 40.0000 mg | DELAYED_RELEASE_TABLET | Freq: Every day | ORAL | 0 refills | Status: DC

## 2024-03-03 NOTE — Addendum Note (Signed)
 Addended by: FRANCIS ROULEAU A on: 03/03/2024 03:46 PM   Modules accepted: Orders

## 2024-04-12 ENCOUNTER — Emergency Department (HOSPITAL_COMMUNITY)

## 2024-04-12 ENCOUNTER — Encounter (HOSPITAL_COMMUNITY): Payer: Self-pay | Admitting: Emergency Medicine

## 2024-04-12 ENCOUNTER — Inpatient Hospital Stay (HOSPITAL_COMMUNITY)
Admission: EM | Admit: 2024-04-12 | Discharge: 2024-04-17 | DRG: 064 | Disposition: A | Discharge status: Rehabilitation Facility | Attending: Family Medicine | Admitting: Family Medicine

## 2024-04-12 ENCOUNTER — Other Ambulatory Visit: Payer: Self-pay

## 2024-04-12 DIAGNOSIS — I10 Essential (primary) hypertension: Secondary | ICD-10-CM | POA: Diagnosis not present

## 2024-04-12 DIAGNOSIS — I2510 Atherosclerotic heart disease of native coronary artery without angina pectoris: Secondary | ICD-10-CM | POA: Diagnosis present

## 2024-04-12 DIAGNOSIS — Z885 Allergy status to narcotic agent status: Secondary | ICD-10-CM

## 2024-04-12 DIAGNOSIS — Z823 Family history of stroke: Secondary | ICD-10-CM

## 2024-04-12 DIAGNOSIS — J410 Simple chronic bronchitis: Secondary | ICD-10-CM | POA: Diagnosis not present

## 2024-04-12 DIAGNOSIS — Z8249 Family history of ischemic heart disease and other diseases of the circulatory system: Secondary | ICD-10-CM

## 2024-04-12 DIAGNOSIS — K5792 Diverticulitis of intestine, part unspecified, without perforation or abscess without bleeding: Secondary | ICD-10-CM

## 2024-04-12 DIAGNOSIS — R29702 NIHSS score 2: Secondary | ICD-10-CM | POA: Diagnosis present

## 2024-04-12 DIAGNOSIS — J4489 Other specified chronic obstructive pulmonary disease: Secondary | ICD-10-CM | POA: Diagnosis present

## 2024-04-12 DIAGNOSIS — R4701 Aphasia: Secondary | ICD-10-CM | POA: Diagnosis present

## 2024-04-12 DIAGNOSIS — J449 Chronic obstructive pulmonary disease, unspecified: Secondary | ICD-10-CM | POA: Diagnosis present

## 2024-04-12 DIAGNOSIS — I6389 Other cerebral infarction: Principal | ICD-10-CM | POA: Diagnosis present

## 2024-04-12 DIAGNOSIS — G459 Transient cerebral ischemic attack, unspecified: Principal | ICD-10-CM

## 2024-04-12 DIAGNOSIS — E872 Acidosis, unspecified: Secondary | ICD-10-CM | POA: Diagnosis present

## 2024-04-12 DIAGNOSIS — G9341 Metabolic encephalopathy: Secondary | ICD-10-CM | POA: Diagnosis not present

## 2024-04-12 DIAGNOSIS — K5732 Diverticulitis of large intestine without perforation or abscess without bleeding: Secondary | ICD-10-CM | POA: Diagnosis present

## 2024-04-12 DIAGNOSIS — Z792 Long term (current) use of antibiotics: Secondary | ICD-10-CM

## 2024-04-12 DIAGNOSIS — E876 Hypokalemia: Secondary | ICD-10-CM | POA: Diagnosis not present

## 2024-04-12 DIAGNOSIS — Z825 Family history of asthma and other chronic lower respiratory diseases: Secondary | ICD-10-CM

## 2024-04-12 DIAGNOSIS — Z7982 Long term (current) use of aspirin: Secondary | ICD-10-CM

## 2024-04-12 DIAGNOSIS — Z9842 Cataract extraction status, left eye: Secondary | ICD-10-CM

## 2024-04-12 DIAGNOSIS — Z8673 Personal history of transient ischemic attack (TIA), and cerebral infarction without residual deficits: Secondary | ICD-10-CM

## 2024-04-12 DIAGNOSIS — Z91128 Patient's intentional underdosing of medication regimen for other reason: Secondary | ICD-10-CM

## 2024-04-12 DIAGNOSIS — K219 Gastro-esophageal reflux disease without esophagitis: Secondary | ICD-10-CM | POA: Diagnosis present

## 2024-04-12 DIAGNOSIS — K589 Irritable bowel syndrome without diarrhea: Secondary | ICD-10-CM | POA: Diagnosis present

## 2024-04-12 DIAGNOSIS — Z87891 Personal history of nicotine dependence: Secondary | ICD-10-CM

## 2024-04-12 DIAGNOSIS — Z8619 Personal history of other infectious and parasitic diseases: Secondary | ICD-10-CM

## 2024-04-12 DIAGNOSIS — N179 Acute kidney failure, unspecified: Secondary | ICD-10-CM | POA: Diagnosis present

## 2024-04-12 DIAGNOSIS — Z961 Presence of intraocular lens: Secondary | ICD-10-CM | POA: Diagnosis present

## 2024-04-12 DIAGNOSIS — Z8616 Personal history of COVID-19: Secondary | ICD-10-CM

## 2024-04-12 DIAGNOSIS — Z833 Family history of diabetes mellitus: Secondary | ICD-10-CM

## 2024-04-12 DIAGNOSIS — Z882 Allergy status to sulfonamides status: Secondary | ICD-10-CM

## 2024-04-12 DIAGNOSIS — M069 Rheumatoid arthritis, unspecified: Secondary | ICD-10-CM | POA: Diagnosis present

## 2024-04-12 DIAGNOSIS — Z9049 Acquired absence of other specified parts of digestive tract: Secondary | ICD-10-CM

## 2024-04-12 DIAGNOSIS — Z9841 Cataract extraction status, right eye: Secondary | ICD-10-CM

## 2024-04-12 DIAGNOSIS — M797 Fibromyalgia: Secondary | ICD-10-CM | POA: Diagnosis present

## 2024-04-12 DIAGNOSIS — E785 Hyperlipidemia, unspecified: Secondary | ICD-10-CM | POA: Diagnosis present

## 2024-04-12 DIAGNOSIS — Z79899 Other long term (current) drug therapy: Secondary | ICD-10-CM

## 2024-04-12 DIAGNOSIS — R930 Abnormal findings on diagnostic imaging of skull and head, not elsewhere classified: Secondary | ICD-10-CM

## 2024-04-12 LAB — URINALYSIS, W/ REFLEX TO CULTURE (INFECTION SUSPECTED)
Bacteria, UA: NONE SEEN
Bilirubin Urine: NEGATIVE
Glucose, UA: NEGATIVE mg/dL
Hgb urine dipstick: NEGATIVE
Ketones, ur: 5 mg/dL — AB
Leukocytes,Ua: NEGATIVE
Nitrite: NEGATIVE
Protein, ur: NEGATIVE mg/dL
Specific Gravity, Urine: 1.008 (ref 1.005–1.030)
pH: 7 (ref 5.0–8.0)

## 2024-04-12 LAB — RAPID URINE DRUG SCREEN, HOSP PERFORMED
Amphetamines: NOT DETECTED
Barbiturates: NOT DETECTED
Benzodiazepines: NOT DETECTED
Cocaine: NOT DETECTED
Opiates: POSITIVE — AB
Tetrahydrocannabinol: NOT DETECTED

## 2024-04-12 LAB — CBC WITH DIFFERENTIAL/PLATELET
Band Neutrophils: 1 %
Basophils Absolute: 0 K/uL (ref 0.0–0.1)
Basophils Relative: 0 %
Eosinophils Absolute: 0 K/uL (ref 0.0–0.5)
Eosinophils Relative: 0 %
HCT: 38.1 % (ref 36.0–46.0)
Hemoglobin: 12.8 g/dL (ref 12.0–15.0)
Lymphocytes Relative: 24 %
Lymphs Abs: 4.5 K/uL — ABNORMAL HIGH (ref 0.7–4.0)
MCH: 28.6 pg (ref 26.0–34.0)
MCHC: 33.6 g/dL (ref 30.0–36.0)
MCV: 85 fL (ref 80.0–100.0)
Monocytes Absolute: 1.3 K/uL — ABNORMAL HIGH (ref 0.1–1.0)
Monocytes Relative: 7 %
Neutro Abs: 12.9 K/uL — ABNORMAL HIGH (ref 1.7–7.7)
Neutrophils Relative %: 68 %
Platelets: 257 K/uL (ref 150–400)
RBC: 4.48 MIL/uL (ref 3.87–5.11)
RDW: 13.9 % (ref 11.5–15.5)
Smear Review: NORMAL
WBC: 18.7 K/uL — ABNORMAL HIGH (ref 4.0–10.5)
nRBC: 0 % (ref 0.0–0.2)

## 2024-04-12 LAB — AMMONIA: Ammonia: <13 umol/L (ref 9–35)

## 2024-04-12 LAB — COMPREHENSIVE METABOLIC PANEL WITH GFR
ALT: 24 U/L (ref 0–44)
AST: 30 U/L (ref 15–41)
Albumin: 4.1 g/dL (ref 3.5–5.0)
Alkaline Phosphatase: 57 U/L (ref 38–126)
Anion gap: 18 — ABNORMAL HIGH (ref 5–15)
BUN: 20 mg/dL (ref 8–23)
CO2: 21 mmol/L — ABNORMAL LOW (ref 22–32)
Calcium: 10.1 mg/dL (ref 8.9–10.3)
Chloride: 101 mmol/L (ref 98–111)
Creatinine, Ser: 1.35 mg/dL — ABNORMAL HIGH (ref 0.44–1.00)
GFR, Estimated: 41 mL/min — ABNORMAL LOW (ref >60)
Glucose, Bld: 88 mg/dL (ref 70–99)
Potassium: 2.6 mmol/L — CL (ref 3.5–5.1)
Sodium: 140 mmol/L (ref 135–145)
Total Bilirubin: 1.4 mg/dL — ABNORMAL HIGH (ref 0.0–1.2)
Total Protein: 7.6 g/dL (ref 6.5–8.1)

## 2024-04-12 LAB — MAGNESIUM: Magnesium: 1.8 mg/dL (ref 1.7–2.4)

## 2024-04-12 LAB — TSH: TSH: 2.559 u[IU]/mL (ref 0.350–4.500)

## 2024-04-12 LAB — ETHANOL: Alcohol, Ethyl (B): <15 mg/dL (ref <15)

## 2024-04-12 MED ORDER — METRONIDAZOLE 500 MG/100ML IV SOLN
500.0000 mg | Freq: Two times a day (BID) | INTRAVENOUS | Status: DC
Start: 2024-04-13 — End: 2024-04-15
  Administered 2024-04-13 – 2024-04-15 (×5): 500 mg via INTRAVENOUS
  Filled 2024-04-12 (×5): qty 100

## 2024-04-12 MED ORDER — ONDANSETRON HCL 4 MG PO TABS: 4.0000 mg | ORAL_TABLET | Freq: Four times a day (QID) | ORAL | Status: DC | PRN

## 2024-04-12 MED ORDER — ENOXAPARIN SODIUM 40 MG/0.4ML IJ SOSY
40.0000 mg | PREFILLED_SYRINGE | INTRAMUSCULAR | Status: DC
  Administered 2024-04-12 – 2024-04-16 (×5): 40 mg via SUBCUTANEOUS
  Filled 2024-04-12 (×5): qty 0.4

## 2024-04-12 MED ORDER — POTASSIUM CHLORIDE 20 MEQ PO PACK
40.0000 meq | PACK | Freq: Once | ORAL | Status: AC
  Administered 2024-04-12: 40 meq via ORAL
  Filled 2024-04-12: qty 2

## 2024-04-12 MED ORDER — ACETAMINOPHEN 650 MG RE SUPP: 650.0000 mg | Freq: Four times a day (QID) | RECTAL | Status: DC | PRN

## 2024-04-12 MED ORDER — IOHEXOL 300 MG/ML  SOLN
80.0000 mL | Freq: Once | INTRAMUSCULAR | Status: AC | PRN
  Administered 2024-04-12: 80 mL via INTRAVENOUS

## 2024-04-12 MED ORDER — HALOPERIDOL LACTATE 5 MG/ML IJ SOLN: 1.0000 mg | Freq: Once | INTRAMUSCULAR | Status: DC

## 2024-04-12 MED ORDER — LEFLUNOMIDE 20 MG PO TABS
20.0000 mg | ORAL_TABLET | Freq: Every day | ORAL | Status: DC
  Administered 2024-04-13 – 2024-04-17 (×5): 20 mg via ORAL
  Filled 2024-04-12 (×6): qty 1

## 2024-04-12 MED ORDER — SODIUM CHLORIDE 0.9 % IV SOLN
2.0000 g | INTRAVENOUS | Status: DC
  Administered 2024-04-13 – 2024-04-14 (×2): 2 g via INTRAVENOUS
  Filled 2024-04-12 (×2): qty 20

## 2024-04-12 MED ORDER — POTASSIUM CHLORIDE 10 MEQ/100ML IV SOLN
10.0000 meq | INTRAVENOUS | Status: AC
  Administered 2024-04-12 (×5): 10 meq via INTRAVENOUS
  Filled 2024-04-12 (×5): qty 100

## 2024-04-12 MED ORDER — MORPHINE SULFATE (PF) 4 MG/ML IV SOLN
4.0000 mg | Freq: Once | INTRAVENOUS | Status: AC
  Administered 2024-04-12: 4 mg via INTRAVENOUS
  Filled 2024-04-12: qty 1

## 2024-04-12 MED ORDER — HALOPERIDOL LACTATE 5 MG/ML IJ SOLN
1.0000 mg | Freq: Once | INTRAMUSCULAR | Status: AC
  Administered 2024-04-12: 1 mg via INTRAVENOUS
  Filled 2024-04-12: qty 1

## 2024-04-12 MED ORDER — METRONIDAZOLE 500 MG/100ML IV SOLN
500.0000 mg | Freq: Once | INTRAVENOUS | Status: AC
  Administered 2024-04-12: 500 mg via INTRAVENOUS
  Filled 2024-04-12: qty 100

## 2024-04-12 MED ORDER — ONDANSETRON HCL 4 MG/2ML IJ SOLN
4.0000 mg | Freq: Once | INTRAMUSCULAR | Status: AC
  Administered 2024-04-12: 4 mg via INTRAVENOUS
  Filled 2024-04-12: qty 2

## 2024-04-12 MED ORDER — BUDESON-GLYCOPYRROL-FORMOTEROL 160-9-4.8 MCG/ACT IN AERO
2.0000 | INHALATION_SPRAY | Freq: Two times a day (BID) | RESPIRATORY_TRACT | Status: DC
  Administered 2024-04-13 – 2024-04-17 (×8): 2 via RESPIRATORY_TRACT
  Filled 2024-04-12: qty 5.9

## 2024-04-12 MED ORDER — ACETAMINOPHEN 325 MG PO TABS
650.0000 mg | ORAL_TABLET | Freq: Four times a day (QID) | ORAL | Status: DC | PRN
  Administered 2024-04-16: 650 mg via ORAL
  Filled 2024-04-12: qty 2

## 2024-04-12 MED ORDER — SODIUM CHLORIDE 0.9 % IV SOLN
2.0000 g | Freq: Once | INTRAVENOUS | Status: AC
  Administered 2024-04-12: 2 g via INTRAVENOUS
  Filled 2024-04-12: qty 20

## 2024-04-12 MED ORDER — ONDANSETRON HCL 4 MG/2ML IJ SOLN: 4.0000 mg | Freq: Four times a day (QID) | INTRAMUSCULAR | Status: DC | PRN

## 2024-04-12 MED ORDER — SODIUM CHLORIDE 0.9 % IV BOLUS
1000.0000 mL | Freq: Once | INTRAVENOUS | Status: AC
  Administered 2024-04-12: 1000 mL via INTRAVENOUS

## 2024-04-12 MED ORDER — SODIUM CHLORIDE 0.9 % IV SOLN: INTRAVENOUS | Status: DC

## 2024-04-12 MED ORDER — AMLODIPINE BESYLATE 5 MG PO TABS
10.0000 mg | ORAL_TABLET | Freq: Every day | ORAL | Status: DC
Start: 2024-04-12 — End: 2024-04-14
  Administered 2024-04-12 – 2024-04-14 (×3): 10 mg via ORAL
  Filled 2024-04-12 (×3): qty 2

## 2024-04-12 NOTE — H&P (Signed)
 History and Physical    Betty Golden FMW:[id number] DOB: [birth year] DOA: 04/12/2024  PCP: Irven Ozell DEL, MD   Patient coming from: Home  I have personally briefly reviewed patient's old medical records in [facility]  Chief Complaint: AMS  HPI: Betty Golden is a 78 y.o. female with medical history significant for COPD, hypertension, rheumatoid arthritis, C. difficile colitis. Patient presented to the ED with complaints of generalized weakness, dizziness, several falls, hallucinations that started today.  Per family, patient was walking around the house naked.  My evaluation, patient is awake, intermittently confused, she is able to answer few questions.  She reports poor oral intake over the past 2 days.  No vomiting.  She reports 3 diarrheal stools today.  No cough no difficulty breathing no chest pain.  She reports generalized body aches.  I talked to patient's daughter, confirmed the above, and that patient had not taken her medications in 2 days.  At baseline patient has no memory deficits.  ED Course: Temperature 97.5.  Heart rate 88-95.  Respiratory rate 14-26.  Blood pressure systolic 122-172.  O2 sats greater than 90% on room air. Potassium 2.6.  Magnesium  1.8.  Leukocytosis of 18.7. Chest x-ray - no active disease. Head CT without contrast- suspected recent infarct of the right parietal white matter. CTAP WC-mild sigmoid colonic diverticulitis.Descending and sigmoid colonic wall thickening versus nondistention, can be seen with colitis. IV ceftriaxone  and metronidazole  started.  1 L bolus given. Unfortunately MRI could not be done despite 1 mg Haldol  given in ED -due to agitation, patient's became anxious and would not lay still.  Review of Systems: As per HPI all other systems reviewed and negative.  Past Medical History:  Diagnosis Date   Asthma    C. difficile colitis    Cervical stenosis of spine    Chronic headaches    Chronic neck pain    Coronary  artery disease    Depression    Diverticulosis    Essential hypertension    Fibromyalgia    GERD (gastroesophageal reflux disease)    History of Salmonella gastroenteritis    HNP (herniated nucleus pulposus), lumbar    Hyperlipidemia    IBS (irritable bowel syndrome)    Lung nodule    rheumatoid arthritis     Past Surgical History:  Procedure Laterality Date   APPENDECTOMY  1965   BIOPSY  08/26/2018   Procedure: BIOPSY;  Surgeon: Golda Claudis PENNER, MD;  Location: [facility];  Service: Endoscopy;;  gastric   BREAST BIOPSY     BREAST REDUCTION SURGERY  1994   CATARACT EXTRACTION W/PHACO Left 06/09/2023   Procedure: CATARACT EXTRACTION PHACO AND INTRAOCULAR LENS PLACEMENT (IOC);  Surgeon: Harrie Agent, MD;  Location: [facility];  Service: Ophthalmology;  Laterality: Left;  CDE 7.03   CATARACT EXTRACTION W/PHACO Right 06/23/2023   Procedure: CATARACT EXTRACTION PHACO AND INTRAOCULAR LENS PLACEMENT (IOC);  Surgeon: Harrie Agent, MD;  Location: [facility];  Service: Ophthalmology;  Laterality: Right;  CDE: 11.55   CHOLECYSTECTOMY  1975   COLONOSCOPY N/A 08/19/2013   Procedure: COLONOSCOPY;  Surgeon: Claudis PENNER Golda, MD;  Location: [facility];  Service: Endoscopy;  Laterality: N/A;  155-moved to 140 Ann to notify pt   COLONOSCOPY N/A 08/26/2018   Procedure: COLONOSCOPY;  Surgeon: Golda Claudis PENNER, MD;  Location: [facility];  Service: Endoscopy;  Laterality: N/A;   ESOPHAGOGASTRODUODENOSCOPY N/A 08/26/2018   Procedure: ESOPHAGOGASTRODUODENOSCOPY (EGD);  Surgeon: Golda Claudis PENNER, MD;  Location: [facility];  Service: Endoscopy;  Laterality: N/A;   fibromyalgia     LUMBAR LAMINECTOMY/DECOMPRESSION MICRODISCECTOMY Bilateral 09/20/2015   Procedure: MICRO LUMBAR DECOMPRESSION L5-S1 BILATERAL    (1 LEVEL);  Surgeon: Reyes Billing, MD;  Location: [facility];  Service: Orthopedics;  Laterality: Bilateral;   rheumatoid arthritis     Sinus Surgery     TONSILLECTOMY  1968     reports that she quit  smoking about 35 years ago. Her smoking use included cigarettes. She started smoking about 45 years ago. She has a 20 pack-year smoking history. She has never used smokeless tobacco. She reports that she does not drink alcohol and does not use drugs.  Allergies  Allergen Reactions   Hydromorphone  Anaphylaxis   Sulfa Antibiotics Rash    Family History  Problem Relation Age of Onset   Heart disease Mother    Hypertension Mother    Asthma Mother    Heart disease Father    Stroke Father    Hypertension Father    Stroke Sister    Asthma Sister    Hypertension Brother    Asthma Brother    Asthma Maternal Grandmother    Heart disease Maternal Grandmother    Diabetes Maternal Grandfather    Asthma Maternal Aunt    Colon cancer Neg Hx     Prior to Admission medications   Medication Sig Start Date End Date Taking? Authorizing Provider  albuterol  (PROAIR  HFA) 108 (90 Base) MCG/ACT inhaler Inhale 2 puffs into the lungs every 4 (four) hours as needed for wheezing or shortness of breath. 04/16/23  Yes Iva Marty Saltness, MD  amLODipine  (NORVASC ) 10 MG tablet Take 10 mg by mouth daily. 01/23/23  Yes [provider]  atorvastatin  (LIPITOR) 10 MG tablet Take 10 mg by mouth daily. 03/16/23  Yes [provider]  benralizumab  (FASENRA ) 30 MG/ML prefilled syringe Inject 1 mL (30 mg total) into the skin every 8 (eight) weeks. 07/31/23  Yes Iva Marty Saltness, MD  CALCIUM  PO Take 1,250 mg by mouth.   Yes [provider]  cephALEXin (KEFLEX) 500 MG capsule Take 1,000 mg by mouth 2 (two) times daily. 04/05/24 04/15/24 Yes [provider]  Certolizumab Pegol (CIMZIA [place]) Inject 1 Dose into the skin every 14 (fourteen) days.   Yes [provider]  Cetirizine HCl (ZYRTEC PO) Take 10 mg by mouth daily at 6 (six) AM.   Yes [provider]  clindamycin (CLEOCIN T) 1 % lotion Apply 1 Application topically daily.   Yes [provider]  colestipol   (COLESTID ) 1 g tablet Take 2 g by mouth daily. Take 4 hours before or after other medications.   Yes [provider]  DULoxetine  (CYMBALTA ) 30 MG capsule Take 30 mg by mouth 2 (two) times daily.   Yes [provider]  Ferrous Sulfate (IRON PO) Take 65 mg by mouth daily.   Yes [provider]  fluticasone  (FLONASE ) 50 MCG/ACT nasal spray SPRAY 2 SPRAYS INTO EACH NOSTRIL EVERY DAY 10/15/23  Yes Iva Marty Saltness, MD  Fluticasone -Umeclidin-Vilant (TRELEGY ELLIPTA ) 200-62.5-25 MCG/ACT AEPB Inhale 1 puff into the lungs in the morning. 10/15/23  Yes Iva Marty Saltness, MD  leflunomide  (ARAVA ) 20 MG tablet Take 20 mg by mouth daily.   Yes [provider]  losartan -hydrochlorothiazide  (HYZAAR) 100-12.5 MG tablet Take 1 tablet by mouth daily. 09/09/21  Yes [provider]  MAGNESIUM  PO Take 400 mg by mouth daily at 6 (six) AM.   Yes  [provider]  Multiple Vitamin (MULTIVITAMIN PO) Take 1 tablet by mouth daily at 6 (six) AM.   Yes [provider]  pantoprazole  (PROTONIX ) 40 MG tablet Take 1 tablet (40 mg total) by mouth daily. 03/03/24  Yes Iva Marty Saltness, MD  potassium chloride  SA (KLOR-CON  M) 20 MEQ tablet Take 1 tablet (20 mEq total) by mouth daily. 01/21/23  Yes Suzette Pac, MD  pregabalin  (LYRICA ) 50 MG capsule Take 50 mg by mouth 2 (two) times daily.   Yes [provider]  RESTASIS  0.05 % ophthalmic emulsion Place 1 drop into both eyes 2 (two) times daily. 04/01/23  Yes [provider]  vitamin B-12 (CYANOCOBALAMIN ) 1000 MCG tablet Take 1 tablet (1,000 mcg total) by mouth daily. 10/24/21  Yes Neomi Johnston ONEIDA DEVONNA    Physical Exam: Vitals:   04/12/24 1800 04/12/24 1830 04/12/24 1900 04/12/24 1930  BP: (!) 161/69 (!) 162/78 (!) 168/98 (!) 172/77  Pulse: 92 92 90 93  Resp: 15 14 (!) 21 16  Temp: 97.6 F (36.4 C)   97.7 F (36.5 C)  TempSrc: Oral     SpO2: 94% 93% 94% 90%  Weight:      Height:         Constitutional: NAD, calm, comfortable Vitals:   04/12/24 1800 04/12/24 1830 04/12/24 1900 04/12/24 1930  BP: (!) 161/69 (!) 162/78 (!) 168/98 (!) 172/77  Pulse: 92 92 90 93  Resp: 15 14 (!) 21 16  Temp: 97.6 F (36.4 C)   97.7 F (36.5 C)  TempSrc: Oral     SpO2: 94% 93% 94% 90%  Weight:      Height:       Eyes: PERRL, lids and conjunctivae normal ENMT: Mucous membranes are moist.  Neck: normal, supple, no masses, no thyromegaly Respiratory: clear to auscultation bilaterally, no wheezing, no crackles. Normal respiratory effort. No accessory muscle use.  Cardiovascular: Regular rate and rhythm, no murmurs / rubs / gallops. No extremity edema.  Extremities warm. Abdomen: Tenderness initially present, not appreciable on my evaluation, she is status post morphine ,, no masses palpated. No hepatosplenomegaly. Musculoskeletal: no clubbing / cyanosis. No joint deformity upper and lower extremities. Good ROM, no contractures. Normal muscle tone.  Skin: no rashes, lesions, ulcers. No induration Neurologic: Limited exam due to altered mental status.  No facial asymmetry, speech fluent, moves extremities spontaneously, good equal grip strength to bilateral upper extremity, at least 4/5 strength, 5 of 5 strength right lower extremity, left lower extremity slightly decreased strength but unsure if this is from poor cooperation or confusion on patient's part. Psychiatric: Awake and alert, intermittent confused speech.  Labs on Admission: I have personally reviewed following labs and imaging studies  CBC: Recent Labs  Lab 04/12/24 1434  WBC 18.7*  NEUTROABS 12.9*  HGB 12.8  HCT 38.1  MCV 85.0  PLT 257   Basic Metabolic Panel: Recent Labs  Lab 04/12/24 1434  NA 140  K 2.6*  CL 101  CO2 21*  GLUCOSE 88  BUN 20  CREATININE 1.35*  CALCIUM  10.1  MG 1.8   GFR: Estimated Creatinine Clearance: 33.5 mL/min (A) (by C-G formula based on SCr of 1.35 mg/dL (H)). Liver Function  Tests: Recent Labs  Lab 04/12/24 1434  AST 30  ALT 24  ALKPHOS 57  BILITOT 1.4*  PROT 7.6  ALBUMIN 4.1   No results for input(s): LIPASE, AMYLASE in the last 168 hours. Recent Labs  Lab 04/12/24 1434  AMMONIA <13  Thyroid Function Tests: Recent Labs    04/12/24 1434  TSH 2.559  Urine analysis:    Component Value Date/Time   COLORURINE YELLOW 04/12/2024 1407   APPEARANCEUR CLEAR 04/12/2024 1407   APPEARANCEUR Clear 01/18/2022 1311   LABSPEC 1.008 04/12/2024 1407   PHURINE 7.0 04/12/2024 1407   GLUCOSEU NEGATIVE 04/12/2024 1407   HGBUR NEGATIVE 04/12/2024 1407   BILIRUBINUR NEGATIVE 04/12/2024 1407   BILIRUBINUR Negative 01/18/2022 1311   KETONESUR 5 (A) 04/12/2024 1407   PROTEINUR NEGATIVE 04/12/2024 1407   UROBILINOGEN 0.2 03/09/2015 1035   NITRITE NEGATIVE 04/12/2024 1407   LEUKOCYTESUR NEGATIVE 04/12/2024 1407    Radiological Exams on Admission: CT ABDOMEN PELVIS W CONTRAST Result Date: 04/12/2024 CLINICAL DATA:  Acute abdominal pain.  Dizziness and multiple falls. EXAM: CT ABDOMEN AND PELVIS WITH CONTRAST TECHNIQUE: Multidetector CT imaging of the abdomen and pelvis was performed using the standard protocol following bolus administration of intravenous contrast. RADIATION DOSE REDUCTION: This exam was performed according to the departmental dose-optimization program which includes automated exposure control, adjustment of the mA and/or kV according to patient size and/or use of iterative reconstruction technique. CONTRAST:  80mL OMNIPAQUE  IOHEXOL  300 MG/ML  SOLN COMPARISON:  CT 09/25/2021 FINDINGS: Lower chest: Chronic cystic changes in the lung bases. No acute airspace disease. Hepatobiliary: Post cholecystectomy. Prominence of the central intrahepatic and extrahepatic common bile duct likely related to prior cholecystectomy. The distal CBD measures 9 mm, normal for postcholecystectomy state. No focal liver abnormality. Pancreas: Atrophic.  No ductal dilatation or  inflammation. Spleen: Normal in size without focal abnormality. Adrenals/Urinary Tract: No adrenal nodule. No hydronephrosis. Simple appearing cyst in the mid right kidney. No further follow-up imaging is recommended. Lobulated right renal contours suggestive of scarring. Heterogeneous enhancement of the upper pole of the right kidney. No perinephric stranding. No renal calculi. Decompressed ureters. Partially distended urinary bladder, normal for degree of distension. Stomach/Bowel: Multiple sigmoid colonic diverticula with mild inflammation about the proximal sigmoid, suggestive of mild diverticulitis. No perforation or abscess. Descending and sigmoid colonic wall thickening versus nondistention. Appendectomy per history. There is no small bowel inflammation or obstruction. Stomach is partially distended without wall thickening. Vascular/Lymphatic: Aorto bi-iliac atherosclerosis. No aortic aneurysm. The portal vein is patent. No enlarged lymph nodes in the abdomen or pelvis. Reproductive: Uterus and bilateral adnexa are unremarkable. Other: No free air. No abdominopelvic collection. No free fluid. Small fat containing umbilical hernia. Musculoskeletal: The bones are subjectively under mineralized. Degenerative change in the spine and hips. There are no acute or suspicious osseous abnormalities. IMPRESSION: 1. Mild sigmoid colonic diverticulitis. No perforation or abscess. 2. Descending and sigmoid colonic wall thickening versus nondistention, can be seen with colitis. 3. Heterogeneous enhancement of the upper pole of the right kidney. This may be related to scarring or pyelonephritis recommend correlation with urinalysis. Aortic Atherosclerosis (ICD10-I70.0). Electronically Signed   By: Andrea Gasman M.D.   On: 04/12/2024 17:05   CT Head Wo Contrast Result Date: 04/12/2024 CLINICAL DATA:  Mental status change, unknown cause. Hallucinations. Weakness and dizziness with multiple recent falls. EXAM: CT HEAD  WITHOUT CONTRAST TECHNIQUE: Contiguous axial images were obtained from the base of the skull through the vertex without intravenous contrast. RADIATION DOSE REDUCTION: This exam was performed according to the departmental dose-optimization program which includes automated exposure control, adjustment of the mA and/or kV according to patient size and/or use of iterative reconstruction technique. COMPARISON:  Head MRI 02/27/2024 and CT 09/25/2021 FINDINGS: Brain: There is an approximately 1.5 cm focus  of hypodensity in the right parietal white matter near the posterior aspect of the right corona radiata which appears new or progressive from last month's MRI and is suspicious for a recent infarct. 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 No intracranial hemorrhage, mass, midline shift, hydrocephalus, or extra-axial fluid collection is identified. Vascular: Calcified atherosclerosis at the skull base. No hyperdense vessel. Skull: No fracture or suspicious lesion. Sinuses/Orbits: Prior functional endoscopic sinus surgery. Clear mastoid air cells. Bilateral cataract extraction. Other: None. IMPRESSION: 1. Suspected recent infarct in the right parietal white matter. 2. No intracranial hemorrhage. 3. Chronic small vessel ischemia. Electronically Signed   By: Dasie Hamburg M.D.   On: 04/12/2024 16:48   DG Chest Portable 1 View Result Date: 04/12/2024 CLINICAL DATA:  Weakness. EXAM: PORTABLE CHEST 1 VIEW COMPARISON:  01/21/2023 FINDINGS: Both lungs are clear. Heart size is normal. Trachea is midline. Negative for a pneumothorax. Degenerative changes at both shoulders and AC joints. Bridging osteophytes along the right side of the thoracic spine. IMPRESSION: No active disease. Electronically Signed   By: Juliene Balder M.D.   On: 04/12/2024 14:22   EKG: Independently reviewed.  Sinus rhythm, rate 98, QTc 451.  No  significant change from prior.  Assessment/Plan Principal Problem:   Acute metabolic encephalopathy Active Problems:   RA (rheumatoid arthritis) (HCC)   Essential hypertension   COPD (chronic obstructive pulmonary disease) (HCC)  Assessment and Plan: * Acute metabolic encephalopathy Confusion, hallucinations that started today, for oral intake of 2 days, diarrhea.  Head CT-suggest recent infarct, MRI could not be done due to poor patient cooperation.  CTAP WC also shows mild sigmoid diverticulitis/colitis.  Currently no pyelonephritis- but UA not suggestive of UTI. -1 L bolus given, N/s 100cc/hr x 15hrs - Hold home Lyrica  for now  Acute sigmoid diverticulitis-presenting with abdominal pain, leukocytosis of 18.7.  Rules out for sepsis. CT [facility] WC- Mild sigmoid colonic diverticulitis. No perforation or abscess. Descending and sigmoid colonic wall thickening versus nondistention, can be seen with colitis. - IV ceftriaxone  and metronidazole   Hypokalemia potassium 2.6.  Magnesium  1.8. - replete  RA (rheumatoid arthritis) (HCC) Reports generalized body aches.  But no obvious swelling of any joints.  She is on leflunomide  and satralizumab injections q14days. -Resume leflunomide   Essential hypertension Stable.  -Resume Norvasc  -Hold lisinopril and HCTZ to allow for hydration  COPD (chronic obstructive pulmonary disease) (HCC) Stable.  She is on Fasenra  injections every 8 weeks. -Resume home regimen.   DVT prophylaxis: Lovenox  Code Status: Full code Family Communication: None at bedside.  I talked to patient's daughter [name] on the phone.  She would like to be called with any updates. Disposition Plan: ~ 2 days Consults called: None Admission status: Obs tele    Author: Tully FORBES Carwin, MD 04/12/2024 8:43 PM  For on call review [email or web address].

## 2024-04-12 NOTE — ED Triage Notes (Signed)
 Pt family states patient has been hallucinating today and not acting herself. States that yesterday she was weak and dizzy and fell multiple times. Denies hitting her head or LOC. Family also states pt has not been taking her normal medications also. Pt is disoriented x4.

## 2024-04-12 NOTE — Assessment & Plan Note (Signed)
 Stable.  She is on Fasenra  injections every 8 weeks. -Resume home regimen.

## 2024-04-12 NOTE — ED Notes (Signed)
 Date and time results received: 04/12/24 1510   Test: K+ Critical Value: 2.6  Name of Provider Notified: Francesca, MD

## 2024-04-12 NOTE — Assessment & Plan Note (Signed)
 Reports generalized body aches.  But no obvious swelling of any joints.  She is on leflunomide  and satralizumab injections q14days. -Resume leflunomide 

## 2024-04-12 NOTE — Assessment & Plan Note (Signed)
 Confusion, hallucinations that started today, for oral intake of 2 days, diarrhea.  Head CT-suggest recent infarct, MRI could not be done due to poor patient cooperation.  CTAP WC also shows mild sigmoid diverticulitis/colitis.  Currently no pyelonephritis- but UA not suggestive of UTI. -1 L bolus given, N/s 100cc/hr x 15hrs - Hold home Lyrica  for now

## 2024-04-12 NOTE — ED Notes (Signed)
 Pt unable to complete MRI. Per Radiology staff, Pt began panicking in the scanner and would not lay still. EDP Notified and reports Pt can try again tomorrow and may require medication prior to completing the MRI tomorrow.

## 2024-04-12 NOTE — ED Provider Notes (Signed)
 [place] EMERGENCY DEPARTMENT AT [facility] Provider Note  CSN: [id number] Arrival date & time: 04/12/24 1248  Chief Complaint(s) Altered Mental Status  HPI Betty Golden is a 78 y.o. female history of chronic pain, irritable bowel syndrome, rheumatoid arthritis, COPD presenting with altered mental status.  Family reports that patient has not taken her medications for the past 2 days.  Has seemed increasingly confused most this morning.  She is walking around the house naked.  No falls.  They have not noticed any fevers or chills.  Had not noticed any cough, vomiting.  Patient has some chronic unchanged diarrhea.  Reports she has had a similar episode previously.  Was told that it was due to her kidneys somehow.  Patient has been in pain and moaning also.  Prior to this the patient was not really complaining of any specific symptoms like urinary pain or back pain, abdominal pain, chest pain.   Past Medical History Past Medical History:  Diagnosis Date   Asthma    C. difficile colitis    Cervical stenosis of spine    Chronic headaches    Chronic neck pain    Coronary artery disease    Depression    Diverticulosis    Essential hypertension    Fibromyalgia    GERD (gastroesophageal reflux disease)    History of Salmonella gastroenteritis    HNP (herniated nucleus pulposus), lumbar    Hyperlipidemia    IBS (irritable bowel syndrome)    Lung nodule    rheumatoid arthritis    Patient Active Problem List   Diagnosis Date Noted   Acute metabolic encephalopathy 04/12/2024   Dizziness 02/27/2024   Dyslipidemia 02/27/2024   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   Fecal incontinence 02/06/2023   Malnutrition of moderate degree 09/27/2021   Acute respiratory failure with hypoxia (HCC)    Acute urinary retention 09/26/2021   Aspiration pneumonia (HCC) 09/26/2021   RA (rheumatoid arthritis) (HCC) 09/25/2021   Leukocytosis 09/25/2021   Viral pneumonia 04/20/2019   TIA  (transient ischemic attack) 04/16/2019   Dyspnea 04/04/2019   COVID-19 virus infection 04/04/2019   Gastritis and gastroduodenitis 07/08/2018   Rectal bleeding 06/29/2018   Fibromyalgia 09/08/2017   Spinal stenosis of lumbar region 09/20/2015   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/03/2015   Moderate persistent asthma 07/03/2015   Allergic rhinitis 07/03/2015   Colitis due to Clostridium difficile 02/10/2015   Abdominal pain 02/10/2015   Nausea and vomiting 02/10/2015   AKI (acute kidney injury) (HCC) 02/10/2015   Dehydration 02/10/2015   Hypoxia 02/10/2015   Hyponatremia 02/10/2015   Hypokalemia 02/10/2015   COPD (chronic obstructive pulmonary disease) (HCC)    Constipation 07/15/2013   DYSPNEA 04/05/2008   Essential hypertension 03/10/2008   Headache 03/10/2008   Cough 03/10/2008   Home Medication(s) Prior to Admission medications   Medication Sig Start Date End Date Taking? Authorizing Provider  albuterol  (PROAIR  HFA) 108 (90 Base) MCG/ACT inhaler Inhale 2 puffs into the lungs every 4 (four) hours as needed for wheezing or shortness of breath. 04/16/23  Yes Iva Marty Saltness, MD  amLODipine  (NORVASC ) 10 MG tablet Take 10 mg by mouth daily. 01/23/23  Yes [provider]  atorvastatin  (LIPITOR) 10 MG tablet Take 10 mg by mouth daily. 03/16/23  Yes [provider]  benralizumab  (FASENRA ) 30 MG/ML prefilled syringe Inject 1 mL (30 mg total) into the skin every 8 (eight) weeks. 07/31/23  Yes Iva Marty Saltness, MD  CALCIUM  PO Take 1,250 mg by mouth.  Yes [provider]  cephALEXin (KEFLEX) 500 MG capsule Take 1,000 mg by mouth 2 (two) times daily. 04/05/24 04/15/24 Yes [provider]  Certolizumab Pegol (CIMZIA [place]) Inject 1 Dose into the skin every 14 (fourteen) days.   Yes [provider]  Cetirizine HCl (ZYRTEC PO) Take 10 mg by mouth daily at 6 (six) AM.   Yes [provider]  clindamycin (CLEOCIN T) 1 % lotion Apply 1 Application topically  daily.   Yes [provider]  colestipol  (COLESTID ) 1 g tablet Take 2 g by mouth daily. Take 4 hours before or after other medications.   Yes [provider]  DULoxetine  (CYMBALTA ) 30 MG capsule Take 30 mg by mouth 2 (two) times daily.   Yes [provider]  Ferrous Sulfate (IRON PO) Take 65 mg by mouth daily.   Yes [provider]  fluticasone  (FLONASE ) 50 MCG/ACT nasal spray SPRAY 2 SPRAYS INTO EACH NOSTRIL EVERY DAY 10/15/23  Yes Iva Marty Saltness, MD  Fluticasone -Umeclidin-Vilant (TRELEGY ELLIPTA ) 200-62.5-25 MCG/ACT AEPB Inhale 1 puff into the lungs in the morning. 10/15/23  Yes Iva Marty Saltness, MD  leflunomide  (ARAVA ) 20 MG tablet Take 20 mg by mouth daily.   Yes [provider]  losartan -hydrochlorothiazide  (HYZAAR) 100-12.5 MG tablet Take 1 tablet by mouth daily. 09/09/21  Yes [provider]  MAGNESIUM  PO Take 400 mg by mouth daily at 6 (six) AM.   Yes [provider]  Multiple Vitamin (MULTIVITAMIN PO) Take 1 tablet by mouth daily at 6 (six) AM.   Yes [provider]  pantoprazole  (PROTONIX ) 40 MG tablet Take 1 tablet (40 mg total) by mouth daily. 03/03/24  Yes Iva Marty Saltness, MD  potassium chloride  SA (KLOR-CON  M) 20 MEQ tablet Take 1 tablet (20 mEq total) by mouth daily. 01/21/23  Yes Suzette Pac, MD  pregabalin  (LYRICA ) 50 MG capsule Take 50 mg by mouth 2 (two) times daily.   Yes [provider]  RESTASIS  0.05 % ophthalmic emulsion Place 1 drop into both eyes 2 (two) times daily. 04/01/23  Yes [provider]  vitamin B-12 (CYANOCOBALAMIN ) 1000 MCG tablet Take 1 tablet (1,000 mcg total) by mouth daily. 10/24/21  Yes Neomi Johnston ONEIDA DEVONNA                                                                                                                                    Past Surgical History Past Surgical History:  Procedure Laterality Date   APPENDECTOMY  1965   BIOPSY  08/26/2018    Procedure: BIOPSY;  Surgeon: Golda Claudis PENNER, MD;  Location: [facility];  Service: Endoscopy;;  gastric   BREAST BIOPSY     BREAST REDUCTION SURGERY  1994   CATARACT EXTRACTION W/PHACO Left 06/09/2023   Procedure: CATARACT EXTRACTION PHACO AND INTRAOCULAR LENS PLACEMENT (IOC);  Surgeon: Harrie Agent, MD;  Location: [facility];  Service: Ophthalmology;  Laterality:  Left;  CDE 7.03   CATARACT EXTRACTION W/PHACO Right 06/23/2023   Procedure: CATARACT EXTRACTION PHACO AND INTRAOCULAR LENS PLACEMENT (IOC);  Surgeon: Harrie Agent, MD;  Location: [facility];  Service: Ophthalmology;  Laterality: Right;  CDE: 11.55   CHOLECYSTECTOMY  1975   COLONOSCOPY N/A 08/19/2013   Procedure: COLONOSCOPY;  Surgeon: Claudis RAYMOND Rivet, MD;  Location: [facility];  Service: Endoscopy;  Laterality: N/A;  155-moved to 140 Ann to notify pt   COLONOSCOPY N/A 08/26/2018   Procedure: COLONOSCOPY;  Surgeon: Rivet Claudis RAYMOND, MD;  Location: [facility];  Service: Endoscopy;  Laterality: N/A;   ESOPHAGOGASTRODUODENOSCOPY N/A 08/26/2018   Procedure: ESOPHAGOGASTRODUODENOSCOPY (EGD);  Surgeon: Rivet Claudis RAYMOND, MD;  Location: [facility];  Service: Endoscopy;  Laterality: N/A;   fibromyalgia     LUMBAR LAMINECTOMY/DECOMPRESSION MICRODISCECTOMY Bilateral 09/20/2015   Procedure: MICRO LUMBAR DECOMPRESSION L5-S1 BILATERAL    (1 LEVEL);  Surgeon: Reyes Billing, MD;  Location: [facility];  Service: Orthopedics;  Laterality: Bilateral;   rheumatoid arthritis     Sinus Surgery     TONSILLECTOMY  1968   Family History Family History  Problem Relation Age of Onset   Heart disease Mother    Hypertension Mother    Asthma Mother    Heart disease Father    Stroke Father    Hypertension Father    Stroke Sister    Asthma Sister    Hypertension Brother    Asthma Brother    Asthma Maternal Grandmother    Heart disease Maternal Grandmother    Diabetes Maternal Grandfather    Asthma Maternal Aunt    Colon cancer Neg Hx     Social  History Social History   Tobacco Use   Smoking status: Former    Current packs/day: 0.00    Average packs/day: 2.0 packs/day for 10.0 years (20.0 ttl pk-yrs)    Types: Cigarettes    Start date: 09/11/1978    Quit date: 09/11/1988    Years since quitting: 35.6   Smokeless tobacco: Never   Tobacco comments:    quit 1990  Vaping Use   Vaping status: Never Used  Substance Use Topics   Alcohol use: No    Comment: stopped in 1990   Drug use: No    Comment: hx smoking marijuana and cocaine weekends. Stopped in the 1990s.   Allergies Hydromorphone  and Sulfa antibiotics  Review of Systems Review of Systems  All other systems reviewed and are negative.   Physical Exam Vital Signs  I have reviewed the triage vital signs BP (!) 161/69   Pulse 92   Temp 97.6 F (36.4 C) (Oral)   Resp 15   Ht 5' 3 (1.6 m)   Wt 71 kg   SpO2 94%   BMI 27.73 kg/m  Physical Exam Vitals and nursing note reviewed.  Constitutional:      General: She is in acute distress (moaning).     Appearance: She is well-developed.  HENT:     Head: Normocephalic and atraumatic.     Mouth/Throat:     Mouth: Mucous membranes are dry.  Eyes:     Pupils: Pupils are equal, round, and reactive to light.  Cardiovascular:     Rate and Rhythm: Normal rate and regular rhythm.     Heart sounds: No murmur heard. Pulmonary:     Effort: Pulmonary effort is normal. No respiratory distress.     Breath sounds: Normal breath sounds.  Abdominal:     General:  Abdomen is flat.     Palpations: Abdomen is soft.     Tenderness: There is abdominal tenderness (mild, generalized, no rebound or guarding).  Musculoskeletal:        General: No tenderness.     Right lower leg: No edema.     Left lower leg: No edema.  Skin:    General: Skin is warm and dry.  Neurological:     Mental Status: She is alert.     Comments: Patient constantly moaning, states it hurts, otherwise not able to answer orientation questions.  Patient  moving all 4 extremities equally, no obvious cranial nerve abnormality.  Psychiatric:        Mood and Affect: Mood normal.        Behavior: Behavior normal.     ED Results and Treatments Labs (all labs ordered are listed, but only abnormal results are displayed) Labs Reviewed  COMPREHENSIVE METABOLIC PANEL WITH GFR - Abnormal; Notable for the following components:      Result Value   Potassium 2.6 (*)    CO2 21 (*)    Creatinine, Ser 1.35 (*)    Total Bilirubin 1.4 (*)    GFR, Estimated 41 (*)    Anion gap 18 (*)    All other components within normal limits  CBC WITH DIFFERENTIAL/PLATELET - Abnormal; Notable for the following components:   WBC 18.7 (*)    Neutro Abs 12.9 (*)    Lymphs Abs 4.5 (*)    Monocytes Absolute 1.3 (*)    All other components within normal limits  URINALYSIS, W/ REFLEX TO CULTURE (INFECTION SUSPECTED) - Abnormal; Notable for the following components:   Ketones, ur 5 (*)    All other components within normal limits  RAPID URINE DRUG SCREEN, HOSP PERFORMED - Abnormal; Notable for the following components:   Opiates POSITIVE (*)    All other components within normal limits  BLOOD GAS, VENOUS - Abnormal; Notable for the following components:   pH, Ven 7.59 (*)    pCO2, Ven 30 (*)    pO2, Ven <31 (*)    Bicarbonate 29.1 (*)    Acid-Base Excess 7.4 (*)    All other components within normal limits  TSH  AMMONIA  ETHANOL  MAGNESIUM                                                                                                                           Radiology CT ABDOMEN PELVIS W CONTRAST Result Date: 04/12/2024 CLINICAL DATA:  Acute abdominal pain.  Dizziness and multiple falls. EXAM: CT ABDOMEN AND PELVIS WITH CONTRAST TECHNIQUE: Multidetector CT imaging of the abdomen and pelvis was performed using the standard protocol following bolus administration of intravenous contrast. RADIATION DOSE REDUCTION: This exam was performed according to the  departmental dose-optimization program which includes automated exposure control, adjustment of the mA and/or kV according to patient size and/or use of iterative reconstruction technique. CONTRAST:  80mL OMNIPAQUE  IOHEXOL  300 MG/ML  SOLN COMPARISON:  CT 09/25/2021 FINDINGS: Lower chest: Chronic cystic changes in the lung bases. No acute airspace disease. Hepatobiliary: Post cholecystectomy. Prominence of the central intrahepatic and extrahepatic common bile duct likely related to prior cholecystectomy. The distal CBD measures 9 mm, normal for postcholecystectomy state. No focal liver abnormality. Pancreas: Atrophic.  No ductal dilatation or inflammation. Spleen: Normal in size without focal abnormality. Adrenals/Urinary Tract: No adrenal nodule. No hydronephrosis. Simple appearing cyst in the mid right kidney. No further follow-up imaging is recommended. Lobulated right renal contours suggestive of scarring. Heterogeneous enhancement of the upper pole of the right kidney. No perinephric stranding. No renal calculi. Decompressed ureters. Partially distended urinary bladder, normal for degree of distension. Stomach/Bowel: Multiple sigmoid colonic diverticula with mild inflammation about the proximal sigmoid, suggestive of mild diverticulitis. No perforation or abscess. Descending and sigmoid colonic wall thickening versus nondistention. Appendectomy per history. There is no small bowel inflammation or obstruction. Stomach is partially distended without wall thickening. Vascular/Lymphatic: Aorto bi-iliac atherosclerosis. No aortic aneurysm. The portal vein is patent. No enlarged lymph nodes in the abdomen or pelvis. Reproductive: Uterus and bilateral adnexa are unremarkable. Other: No free air. No abdominopelvic collection. No free fluid. Small fat containing umbilical hernia. Musculoskeletal: The bones are subjectively under mineralized. Degenerative change in the spine and hips. There are no acute or suspicious  osseous abnormalities. IMPRESSION: 1. Mild sigmoid colonic diverticulitis. No perforation or abscess. 2. Descending and sigmoid colonic wall thickening versus nondistention, can be seen with colitis. 3. Heterogeneous enhancement of the upper pole of the right kidney. This may be related to scarring or pyelonephritis recommend correlation with urinalysis. Aortic Atherosclerosis (ICD10-I70.0). Electronically Signed   By: Andrea Gasman M.D.   On: 04/12/2024 17:05   CT Head Wo Contrast Result Date: 04/12/2024 CLINICAL DATA:  Mental status change, unknown cause. Hallucinations. Weakness and dizziness with multiple recent falls. EXAM: CT HEAD WITHOUT CONTRAST TECHNIQUE: Contiguous axial images were obtained from the base of the skull through the vertex without intravenous contrast. RADIATION DOSE REDUCTION: This exam was performed according to the departmental dose-optimization program which includes automated exposure control, adjustment of the mA and/or kV according to patient size and/or use of iterative reconstruction technique. COMPARISON:  Head MRI 02/27/2024 and CT 09/25/2021 FINDINGS: Brain: There is an approximately 1.5 cm focus of hypodensity in the right parietal white matter near the posterior aspect of the right corona radiata which appears new or progressive from last month's MRI and is suspicious for a recent infarct. 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 No intracranial hemorrhage, mass, midline shift, hydrocephalus, or extra-axial fluid collection is identified. Vascular: Calcified atherosclerosis at the skull base. No hyperdense vessel. Skull: No fracture or suspicious lesion. Sinuses/Orbits: Prior functional endoscopic sinus surgery. Clear mastoid air cells. Bilateral cataract extraction. Other: None. IMPRESSION: 1. Suspected recent infarct in the right parietal white matter. 2.  No intracranial hemorrhage. 3. Chronic small vessel ischemia. Electronically Signed   By: Dasie Hamburg M.D.   On: 04/12/2024 16:48   DG Chest Portable 1 View Result Date: 04/12/2024 CLINICAL DATA:  Weakness. EXAM: PORTABLE CHEST 1 VIEW COMPARISON:  01/21/2023 FINDINGS: Both lungs are clear. Heart size is normal. Trachea is midline. Negative for a pneumothorax. Degenerative changes at both shoulders and AC joints. Bridging osteophytes along the right side of the thoracic spine. IMPRESSION: No active disease. Electronically Signed   By: Juliene  Philip M.D.   On: 04/12/2024 14:22    Pertinent labs & imaging results that were available during my care of the patient were reviewed by me and considered in my medical decision making (see MDM for details).  Medications Ordered in ED Medications  haloperidol  lactate (HALDOL ) injection 1 mg (0 mg Intravenous Hold 04/12/24 1544)  potassium chloride  10 mEq in 100 mL IVPB (10 mEq Intravenous New Bag/Given 04/12/24 1820)  metroNIDAZOLE  (FLAGYL ) IVPB 500 mg (500 mg Intravenous New Bag/Given 04/12/24 1819)  sodium chloride  0.9 % bolus 1,000 mL (0 mLs Intravenous Stopped 04/12/24 1600)  morphine  (PF) 4 MG/ML injection 4 mg (4 mg Intravenous Given 04/12/24 1436)  ondansetron  (ZOFRAN ) injection 4 mg (4 mg Intravenous Given 04/12/24 1436)  haloperidol  lactate (HALDOL ) injection 1 mg (1 mg Intravenous Given 04/12/24 1517)  iohexol  (OMNIPAQUE ) 300 MG/ML solution 80 mL (80 mLs Intravenous Contrast Given 04/12/24 1612)  cefTRIAXone  (ROCEPHIN ) 2 g in sodium chloride  0.9 % 100 mL IVPB (0 g Intravenous Stopped 04/12/24 1814)                                                                                                                                     Procedures Procedures  (including critical care time)  Medical Decision Making / ED Course   MDM:  78 year old presenting to the emergency department with altered mental status.  Patient uncomfortable appearing, does follow  commands, but seems confused.  Family reports she did have similar episode.  Reviewed chart and patient had episode in 2023 where she was admitted for dehydration and hallucination.  Will check labs.  Differential includes toxic or metabolic process such as dehydration or electrolyte derangement.  Differential also includes intracranial process, no falls but will check CT head.  Patient does not seem to have any appreciable deficit on exam to suggest stroke.  Differential also includes occult infectious process, will check labs including urinalysis, does endorse some abdominal pain so we will check CT scan, check chest x-ray.    Clinical Course as of 04/12/24 1830  Mon Apr 12, 2024  1750 Workup notable for leukocytosis, AKI, hypokalemia, mild acidosis.  Suspect dehydration.  Patient has received IV fluids.  CT abdomen does show evidence of acute diverticulitis.  Urinalysis is bland.  Will give antibiotics.  CT head was obtained and shows possible subacute stroke, attempted to obtain MRI but due to confusion patient was not able to tolerate this.  This can be performed once patient's metabolic encephalopathy has been improved.  Will discuss with hospitalist. [WS]  1828 Signed out to Dr. Emokpae [WS]    Clinical Course User Index [WS] Francesca Elsie CROME, MD     Additional history obtained: -Additional history obtained from family -External records from outside source obtained and reviewed including: Chart review including previous notes, labs, imaging, consultation notes including prior notes    Lab Tests: -I ordered, reviewed, and interpreted labs.  The pertinent results include:   Labs Reviewed  COMPREHENSIVE METABOLIC PANEL WITH GFR - Abnormal; Notable for the following components:      Result Value   Potassium 2.6 (*)    CO2 21 (*)    Creatinine, Ser 1.35 (*)    Total Bilirubin 1.4 (*)    GFR, Estimated 41 (*)    Anion gap 18 (*)    All other components within normal limits  CBC  WITH DIFFERENTIAL/PLATELET - Abnormal; Notable for the following components:   WBC 18.7 (*)    Neutro Abs 12.9 (*)    Lymphs Abs 4.5 (*)    Monocytes Absolute 1.3 (*)    All other components within normal limits  URINALYSIS, W/ REFLEX TO CULTURE (INFECTION SUSPECTED) - Abnormal; Notable for the following components:   Ketones, ur 5 (*)    All other components within normal limits  RAPID URINE DRUG SCREEN, HOSP PERFORMED - Abnormal; Notable for the following components:   Opiates POSITIVE (*)    All other components within normal limits  BLOOD GAS, VENOUS - Abnormal; Notable for the following components:   pH, Ven 7.59 (*)    pCO2, Ven 30 (*)    pO2, Ven <31 (*)    Bicarbonate 29.1 (*)    Acid-Base Excess 7.4 (*)    All other components within normal limits  TSH  AMMONIA  ETHANOL  MAGNESIUM     Notable for see mdm   EKG   EKG Interpretation Date/Time:  Monday April 12 2024 15:43:03 EDT Ventricular Rate:  98 PR Interval:  184 QRS Duration:  87 QT Interval:  353 QTC Calculation: 451 R Axis:   30  Text Interpretation: Sinus rhythm Consider right atrial enlargement Borderline T abnormalities, lateral leads Confirmed by Francesca Fallow ([id number]) on 04/12/2024 4:16:33 PM         Imaging Studies ordered: I ordered imaging studies including CT head, CT abdomen On my interpretation imaging demonstrates possible stroke  I independently visualized and interpreted imaging. I agree with the radiologist interpretation   Medicines ordered and prescription drug management: Meds ordered this encounter  Medications   sodium chloride  0.9 % bolus 1,000 mL   morphine  (PF) 4 MG/ML injection 4 mg    Refill:  0   ondansetron  (ZOFRAN ) injection 4 mg   haloperidol  lactate (HALDOL ) injection 1 mg   haloperidol  lactate (HALDOL ) injection 1 mg   potassium chloride  10 mEq in 100 mL IVPB   iohexol  (OMNIPAQUE ) 300 MG/ML solution 80 mL   cefTRIAXone  (ROCEPHIN ) 2 g in sodium chloride  0.9  % 100 mL IVPB    Antibiotic Indication::   Intra-abdominal   metroNIDAZOLE  (FLAGYL ) IVPB 500 mg    -I have reviewed the patients home medicines and have made adjustments as needed   Consultations Obtained: I requested consultation with the hospitalist,  and discussed lab and imaging findings as well as pertinent plan - they recommend: admission   Cardiac Monitoring: The patient was maintained on a cardiac monitor.  I personally viewed and interpreted the cardiac monitored which showed an underlying rhythm of: NSR   Reevaluation: After the interventions noted above, I reevaluated the patient and found that their symptoms have improved  Co morbidities that complicate the patient evaluation  Past Medical History:  Diagnosis Date   Asthma    C. difficile colitis    Cervical stenosis of spine    Chronic headaches    Chronic neck pain    Coronary artery disease  Depression    Diverticulosis    Essential hypertension    Fibromyalgia    GERD (gastroesophageal reflux disease)    History of Salmonella gastroenteritis    HNP (herniated nucleus pulposus), lumbar    Hyperlipidemia    IBS (irritable bowel syndrome)    Lung nodule    rheumatoid arthritis       Dispostion: Disposition decision including need for hospitalization was considered, and patient admitted to the hospital.    Final Clinical Impression(s) / ED Diagnoses Final diagnoses:  Acute metabolic encephalopathy  Abnormal head CT  Hypokalemia  Diverticulitis     This chart was dictated using voice recognition software.  Despite best efforts to proofread,  errors can occur which can change the documentation meaning.    Francesca Elsie CROME, MD 04/12/24 [phone]

## 2024-04-12 NOTE — ED Notes (Signed)
 Date and time results received: 04/12/24 1452   Test: VBG pO2 Critical Value: < 31  Name of Provider Notified: Francesca, MD

## 2024-04-12 NOTE — Assessment & Plan Note (Signed)
 Stable.  -Resume Norvasc  -Hold lisinopril and HCTZ to allow for hydration

## 2024-04-13 ENCOUNTER — Inpatient Hospital Stay (HOSPITAL_COMMUNITY)

## 2024-04-13 DIAGNOSIS — I69320 Aphasia following cerebral infarction: Secondary | ICD-10-CM | POA: Diagnosis not present

## 2024-04-13 DIAGNOSIS — R29702 NIHSS score 2: Secondary | ICD-10-CM | POA: Diagnosis present

## 2024-04-13 DIAGNOSIS — K589 Irritable bowel syndrome without diarrhea: Secondary | ICD-10-CM | POA: Diagnosis present

## 2024-04-13 DIAGNOSIS — R410 Disorientation, unspecified: Secondary | ICD-10-CM | POA: Diagnosis not present

## 2024-04-13 DIAGNOSIS — M797 Fibromyalgia: Secondary | ICD-10-CM | POA: Diagnosis present

## 2024-04-13 DIAGNOSIS — M069 Rheumatoid arthritis, unspecified: Secondary | ICD-10-CM | POA: Diagnosis present

## 2024-04-13 DIAGNOSIS — I2510 Atherosclerotic heart disease of native coronary artery without angina pectoris: Secondary | ICD-10-CM | POA: Diagnosis present

## 2024-04-13 DIAGNOSIS — K5732 Diverticulitis of large intestine without perforation or abscess without bleeding: Secondary | ICD-10-CM | POA: Diagnosis present

## 2024-04-13 DIAGNOSIS — I6389 Other cerebral infarction: Secondary | ICD-10-CM | POA: Diagnosis present

## 2024-04-13 DIAGNOSIS — E876 Hypokalemia: Secondary | ICD-10-CM | POA: Diagnosis present

## 2024-04-13 DIAGNOSIS — E872 Acidosis, unspecified: Secondary | ICD-10-CM | POA: Diagnosis present

## 2024-04-13 DIAGNOSIS — R5383 Other fatigue: Secondary | ICD-10-CM | POA: Diagnosis not present

## 2024-04-13 DIAGNOSIS — Z8616 Personal history of COVID-19: Secondary | ICD-10-CM | POA: Diagnosis not present

## 2024-04-13 DIAGNOSIS — K5792 Diverticulitis of intestine, part unspecified, without perforation or abscess without bleeding: Secondary | ICD-10-CM | POA: Diagnosis not present

## 2024-04-13 DIAGNOSIS — I6350 Cerebral infarction due to unspecified occlusion or stenosis of unspecified cerebral artery: Secondary | ICD-10-CM

## 2024-04-13 DIAGNOSIS — Z79899 Other long term (current) drug therapy: Secondary | ICD-10-CM | POA: Diagnosis not present

## 2024-04-13 DIAGNOSIS — Z8249 Family history of ischemic heart disease and other diseases of the circulatory system: Secondary | ICD-10-CM | POA: Diagnosis not present

## 2024-04-13 DIAGNOSIS — D72829 Elevated white blood cell count, unspecified: Secondary | ICD-10-CM | POA: Diagnosis not present

## 2024-04-13 DIAGNOSIS — G8191 Hemiplegia, unspecified affecting right dominant side: Secondary | ICD-10-CM | POA: Diagnosis not present

## 2024-04-13 DIAGNOSIS — D62 Acute posthemorrhagic anemia: Secondary | ICD-10-CM | POA: Diagnosis not present

## 2024-04-13 DIAGNOSIS — G9349 Other encephalopathy: Secondary | ICD-10-CM

## 2024-04-13 DIAGNOSIS — I10 Essential (primary) hypertension: Secondary | ICD-10-CM | POA: Diagnosis present

## 2024-04-13 DIAGNOSIS — I69319 Unspecified symptoms and signs involving cognitive functions following cerebral infarction: Secondary | ICD-10-CM | POA: Diagnosis not present

## 2024-04-13 DIAGNOSIS — J4489 Other specified chronic obstructive pulmonary disease: Secondary | ICD-10-CM | POA: Diagnosis present

## 2024-04-13 DIAGNOSIS — Z7982 Long term (current) use of aspirin: Secondary | ICD-10-CM | POA: Diagnosis not present

## 2024-04-13 DIAGNOSIS — I63512 Cerebral infarction due to unspecified occlusion or stenosis of left middle cerebral artery: Secondary | ICD-10-CM | POA: Diagnosis not present

## 2024-04-13 DIAGNOSIS — R4701 Aphasia: Secondary | ICD-10-CM | POA: Diagnosis present

## 2024-04-13 DIAGNOSIS — Z961 Presence of intraocular lens: Secondary | ICD-10-CM | POA: Diagnosis present

## 2024-04-13 DIAGNOSIS — K219 Gastro-esophageal reflux disease without esophagitis: Secondary | ICD-10-CM | POA: Diagnosis present

## 2024-04-13 DIAGNOSIS — N179 Acute kidney failure, unspecified: Secondary | ICD-10-CM | POA: Diagnosis present

## 2024-04-13 DIAGNOSIS — Z8673 Personal history of transient ischemic attack (TIA), and cerebral infarction without residual deficits: Secondary | ICD-10-CM | POA: Diagnosis not present

## 2024-04-13 DIAGNOSIS — E785 Hyperlipidemia, unspecified: Secondary | ICD-10-CM | POA: Diagnosis present

## 2024-04-13 DIAGNOSIS — G9341 Metabolic encephalopathy: Secondary | ICD-10-CM | POA: Diagnosis present

## 2024-04-13 DIAGNOSIS — J449 Chronic obstructive pulmonary disease, unspecified: Secondary | ICD-10-CM | POA: Diagnosis not present

## 2024-04-13 DIAGNOSIS — Z833 Family history of diabetes mellitus: Secondary | ICD-10-CM | POA: Diagnosis not present

## 2024-04-13 LAB — CBC
HCT: 34.8 % — ABNORMAL LOW (ref 36.0–46.0)
Hemoglobin: 11.2 g/dL — ABNORMAL LOW (ref 12.0–15.0)
MCH: 28.9 pg (ref 26.0–34.0)
MCHC: 32.2 g/dL (ref 30.0–36.0)
MCV: 89.9 fL (ref 80.0–100.0)
Platelets: 205 K/uL (ref 150–400)
RBC: 3.87 MIL/uL (ref 3.87–5.11)
RDW: 14.2 % (ref 11.5–15.5)
WBC: 12 K/uL — ABNORMAL HIGH (ref 4.0–10.5)
nRBC: 0 % (ref 0.0–0.2)

## 2024-04-13 LAB — BASIC METABOLIC PANEL WITH GFR
Anion gap: 9 (ref 5–15)
BUN: 14 mg/dL (ref 8–23)
CO2: 21 mmol/L — ABNORMAL LOW (ref 22–32)
Calcium: 8.5 mg/dL — ABNORMAL LOW (ref 8.9–10.3)
Chloride: 111 mmol/L (ref 98–111)
Creatinine, Ser: 0.95 mg/dL (ref 0.44–1.00)
GFR, Estimated: >60 mL/min (ref >60)
Glucose, Bld: 80 mg/dL (ref 70–99)
Potassium: 3.1 mmol/L — ABNORMAL LOW (ref 3.5–5.1)
Sodium: 141 mmol/L (ref 135–145)

## 2024-04-13 MED ORDER — STROKE: EARLY STAGES OF RECOVERY BOOK: Freq: Once | Status: AC

## 2024-04-13 MED ORDER — HALOPERIDOL LACTATE 5 MG/ML IJ SOLN
1.0000 mg | Freq: Four times a day (QID) | INTRAMUSCULAR | Status: DC | PRN
  Administered 2024-04-13 – 2024-04-15 (×3): 2 mg via INTRAMUSCULAR
  Filled 2024-04-13 (×3): qty 1

## 2024-04-13 MED ORDER — POTASSIUM CHLORIDE 20 MEQ PO PACK
40.0000 meq | PACK | Freq: Once | ORAL | Status: AC
  Administered 2024-04-13: 40 meq via ORAL
  Filled 2024-04-13: qty 2

## 2024-04-13 MED ORDER — ASPIRIN 81 MG PO TBEC
81.0000 mg | DELAYED_RELEASE_TABLET | Freq: Every day | ORAL | Status: DC
  Administered 2024-04-13 – 2024-04-17 (×5): 81 mg via ORAL
  Filled 2024-04-13 (×5): qty 1

## 2024-04-13 MED ORDER — ORAL CARE MOUTH RINSE: 15.0000 mL | OROMUCOSAL | Status: DC | PRN

## 2024-04-13 MED ORDER — LORAZEPAM 0.5 MG PO TABS
0.5000 mg | ORAL_TABLET | ORAL | Status: DC | PRN
  Administered 2024-04-13 – 2024-04-16 (×3): 0.5 mg via ORAL
  Filled 2024-04-13 (×3): qty 1

## 2024-04-13 MED ORDER — IOHEXOL 350 MG/ML SOLN
75.0000 mL | Freq: Once | INTRAVENOUS | Status: AC | PRN
Start: 2024-04-13 — End: 2024-04-13
  Administered 2024-04-13: 75 mL via INTRAVENOUS

## 2024-04-13 MED ORDER — TRAZODONE HCL 50 MG PO TABS
25.0000 mg | ORAL_TABLET | Freq: Every evening | ORAL | Status: DC | PRN
  Administered 2024-04-13: 25 mg via ORAL
  Filled 2024-04-13: qty 1

## 2024-04-13 MED ORDER — CLOPIDOGREL BISULFATE 75 MG PO TABS
75.0000 mg | ORAL_TABLET | Freq: Every day | ORAL | Status: DC
  Administered 2024-04-13 – 2024-04-17 (×5): 75 mg via ORAL
  Filled 2024-04-13 (×6): qty 1

## 2024-04-13 NOTE — Plan of Care (Signed)
  Problem: Acute Rehab OT Goals (only OT should resolve) Goal: Pt. Will Perform Grooming Flowsheets (Taken 04/13/2024 1628) Pt Will Perform Grooming:  with modified independence  standing Goal: Pt. Will Perform Upper Body Dressing Flowsheets (Taken 04/13/2024 1628) Pt Will Perform Upper Body Dressing: with modified independence Goal: Pt. Will Perform Lower Body Dressing Flowsheets (Taken 04/13/2024 1628) Pt Will Perform Lower Body Dressing: with modified independence Goal: Pt. Will Transfer To Toilet Flowsheets (Taken 04/13/2024 1628) Pt Will Transfer to Toilet:  with modified independence  ambulating Goal: Pt. Will Perform Toileting-Clothing Manipulation Flowsheets (Taken 04/13/2024 1628) Pt Will Perform Toileting - Clothing Manipulation and hygiene:  with modified independence  sit to/from stand  sitting/lateral leans Goal: Pt/Caregiver Will Perform Home Exercise Program Flowsheets (Taken 04/13/2024 1628) Pt/caregiver will Perform Home Exercise Program:  Increased strength - increased coordination  Left upper extremity  Independently  Nadie Fiumara OT, MOT

## 2024-04-13 NOTE — Evaluation (Signed)
 Occupational Therapy Evaluation Patient Details Name: Betty Golden MRN: [id number] DOB: 11-Jan-1946 Today's Date: 04/13/2024   History of Present Illness   Betty Golden is a 78 y.o. female with medical history significant for COPD, hypertension, rheumatoid arthritis, C. difficile colitis.  Patient presented to the ED with complaints of generalized weakness, dizziness, several falls, hallucinations that started today.  Per family, patient was walking around the house naked.  My evaluation, patient is awake, intermittently confused, she is able to answer few questions.  She reports poor oral intake over the past 2 days.  No vomiting.  She reports 3 diarrheal stools today.  No cough no difficulty breathing no chest pain.  She reports generalized body aches.  I talked to patient's daughter, confirmed the above, and that patient had not taken her medications in 2 days.  At baseline patient has no memory deficits.     Clinical Impressions Pt agreeable to OT and PT co-evaluation. No family present to confirm pt's baseline function. History taken from recent admission/PT note. Chart indicated independent household ambulation. Pt demonstrated word finding difficulty and poor cognition. Pt oriented only to her first name. Supervision to min A for bed mobility. Mod A for ambulation to toilet with noted L LE weakness and occasional buckling. L UE mildly weak as well with poor bilateral gross motor coordination and deficits in L FM coordination. Pt able to completed seated ADL's with CGA; At least min A to mod A for standing tasks due to poor balance. Pt left in the chair with chair alarm set and sitter in the room. Pt will benefit from continued OT in the hospital to increase strength, balance, and endurance for safe ADL's.        If plan is discharge home, recommend the following:   A lot of help with walking and/or transfers;A little help with bathing/dressing/bathroom;Assistance with  cooking/housework;Direct supervision/assist for medications management;Assist for transportation;Help with stairs or ramp for entrance;Supervision due to cognitive status     Functional Status Assessment   Patient has had a recent decline in their functional status and demonstrates the ability to make significant improvements in function in a reasonable and predictable amount of time.     Equipment Recommendations   None recommended by OT     Recommendations for Other Services   Rehab consult     Precautions/Restrictions   Precautions Precautions: Fall Recall of Precautions/Restrictions: Impaired Restrictions Weight Bearing Restrictions Per Provider Order: No     Mobility Bed Mobility Overal bed mobility: Needs Assistance Bed Mobility: Supine to Sit, Sit to Supine     Supine to sit: Supervision Sit to supine: Used rails, Min assist   General bed mobility comments: Supervision for safety due to pt's impulsivity.    Transfers Overall transfer level: Needs assistance Equipment used: Rolling walker (2 wheels) Transfers: Sit to/from Stand, Bed to chair/wheelchair/BSC Sit to Stand: Mod assist     Step pivot transfers: Mod assist     General transfer comment: labored movement; L LE giving way at times when taking steps today. Impulsive.      Balance Overall balance assessment: Needs assistance Sitting-balance support: Feet supported, No upper extremity supported Sitting balance-Leahy Scale: Fair Sitting balance - Comments: seated in chair   Standing balance support: Bilateral upper extremity supported, During functional activity, Reliant on assistive device for balance Standing balance-Leahy Scale: Poor Standing balance comment: with RW; very poor with SPC  ADL either performed or assessed with clinical judgement   ADL Overall ADL's : Needs assistance/impaired Eating/Feeding: Set up;Sitting   Grooming: Minimal  assistance;Standing;Wash/dry hands;Contact guard assist Grooming Details (indicate cue type and reason): Pt wanted to wash hands at the sink; level of assist based on poor balance in standing and cuing for washing hands. Upper Body Bathing: Contact guard assist;Sitting;Supervision/ safety   Lower Body Bathing: Supervison/ safety;Contact guard assist;Sitting/lateral leans   Upper Body Dressing : Supervision/safety;Contact guard assist;Sitting   Lower Body Dressing: Supervision/safety;Contact guard assist;Sitting/lateral leans Lower Body Dressing Details (indicate cue type and reason): Able to doff and don L sock seated in the chair. Toilet Transfer: Moderate assistance;Rolling walker (2 wheels);Ambulation Toilet Transfer Details (indicate cue type and reason): chair to toilet and back with RW and with SPC Toileting- Clothing Manipulation and Hygiene: Contact guard assist;Minimal assistance;Sitting/lateral lean Toileting - Clothing Manipulation Details (indicate cue type and reason): Able to complete peri care with CGA today but more assist possible with having to manage undergarments and clothes. Tub/ Shower Transfer: Moderate assistance;Ambulation;Rolling walker (2 wheels)   Functional mobility during ADLs: Moderate assistance;Rolling walker (2 wheels) General ADL Comments: Able to ambulate to toilet then to sink and back to chair.     Vision Baseline Vision/History: 0 No visual deficits Ability to See in Adequate Light: 0 Adequate Patient Visual Report: No change from baseline Vision Assessment?: Vision impaired- to be further tested in functional context Additional Comments: Pt struggled to follow comands for visual field; Pt able to track well; mild difficulty with convergence. Pt noted to start labored breathing during eye testing.     Perception Perception: Not tested       Praxis Praxis: Not tested       Pertinent Vitals/Pain Pain Assessment Pain Assessment: No/denies pain      Extremity/Trunk Assessment Upper Extremity Assessment Upper Extremity Assessment: LUE deficits/detail LUE Deficits / Details: 4/5 grossly other than grasp which was 4+/5; deficits in FM and GM coordination. Difficult to accurately gauge sensation given pt's cognition. LUE Sensation:  (continue to assess.) LUE Coordination: decreased fine motor;decreased gross motor   Lower Extremity Assessment Lower Extremity Assessment: Defer to PT evaluation   Cervical / Trunk Assessment Cervical / Trunk Assessment: Normal   Communication Communication Communication: Impaired Factors Affecting Communication: Difficulty expressing self;Other (comment) (word finding difficulty)   Cognition Arousal: Alert Behavior During Therapy: Impulsive Cognition: No family/caregiver present to determine baseline, Cognition impaired   Orientation impairments: Person, Place, Time, Situation   Memory impairment (select all impairments): Short-term memory, Working Biochemist, clinical functioning impairment (select all impairments): Sequencing OT - Cognition Comments: Pt oriented to her first name only. Difficulty following commands throughout the session. Impulsive.                 Following commands: Impaired Following commands impaired: Follows one step commands inconsistently     Cueing  General Comments   Cueing Techniques: Verbal cues;Gestural cues;Tactile cues                 Home Living Family/patient expects to be discharged to:: Private residence Living Arrangements: Children Available Help at Discharge: Family;Available 24 hours/day Type of Home: House Home Access: Ramped entrance     Home Layout: Two level;Able to live on main level with bedroom/bathroom;Full bath on main level     Bathroom Shower/Tub: Producer, television/film/video: Standard Bathroom Accessibility: Yes   Home Equipment: Agricultural consultant (2 wheels);Wheelchair - manual;Cane - single point;BSC/3in1  Additional Comments: per chart; pt is a poor historian      Prior Functioning/Environment Prior Level of Function : Patient poor historian/Family not available;Needs assist       Physical Assist : ADLs (physical) Mobility (physical): Bed mobility;Transfers;Gait   Mobility Comments: household ambulator with no AD, community ambulator with SPC (per chart) ADLs Comments: assited by family (per chart; pt reported assist but also independence; unsure of true baseline ADL function.)    OT Problem List: Decreased strength;Decreased range of motion;Decreased activity tolerance;Impaired balance (sitting and/or standing);Impaired vision/perception;Decreased coordination;Decreased cognition;Decreased safety awareness;Decreased knowledge of use of DME or AE;Impaired sensation   OT Treatment/Interventions: Self-care/ADL training;Neuromuscular education;Therapeutic exercise;DME and/or AE instruction;Therapeutic activities;Cognitive remediation/compensation;Patient/family education;Balance training;Visual/perceptual remediation/compensation      OT Goals(Current goals can be found in the care plan section)   Acute Rehab OT Goals Patient Stated Goal: improve function OT Goal Formulation: With patient Time For Goal Achievement: 04/27/24 Potential to Achieve Goals: Good   OT Frequency:  Min 3X/week    Co-evaluation PT/OT/SLP Co-Evaluation/Treatment: Yes Reason for Co-Treatment: To address functional/ADL transfers PT goals addressed during session: Mobility/safety with mobility;Balance OT goals addressed during session: ADL's and self-care                       End of Session Equipment Utilized During Treatment: Rolling walker (2 wheels);Other (comment) [facility]) Nurse Communication: Mobility status  Activity Tolerance: Patient tolerated treatment well Patient left: in chair;with call bell/phone within reach;with chair alarm set;with family/visitor present  OT Visit Diagnosis:  Unsteadiness on feet (R26.81);Other abnormalities of gait and mobility (R26.89);Muscle weakness (generalized) (M62.81);Other symptoms and signs involving the nervous system (R29.898);Other symptoms and signs involving cognitive function;Hemiplegia and hemiparesis Hemiplegia - Right/Left: Left Hemiplegia - caused by: Cerebral infarction                Time: 1520-1545 OT Time Calculation (min): 25 min Charges:  OT General Charges $OT Visit: 1 Visit OT Evaluation $OT Eval Low Complexity: 1 Low  Jahmeer Porche OT, MOT   Jayson Person 04/13/2024, 4:24 PM

## 2024-04-13 NOTE — Plan of Care (Signed)

## 2024-04-13 NOTE — Consult Note (Signed)
 I connected with  Betty Golden on 04/13/24 by a video enabled telemedicine application and verified that I am speaking with the correct person using two identifiers.   I discussed the limitations of evaluation and management by telemedicine. The patient expressed understanding and agreed to proceed.  Location of patient: [facility] Location of physician: [facility]  Neurology Consultation Reason for Consult: ams Referring Physician: Dr Bernardino Come  CC: ams  History is obtained from: patient,chart review  HPI: Betty Golden is a 78 y.o. female with h/o hypertension, rheumatoid arthritis,, HTN and HLD who was brought in due to ams. Per patient she is fine and wants to leave. Per hospitalist and chart review, patient stopped taking her meds for 2 days. She was then noted to be walking around the house naked by her family so the brought her to ER. In ER initially she wasn't talking much and kept repeating it hurts. Gradually her mental status improved and she was able to answer admitting hospitalist's questions. She was diagnosed with sigmoid diverticulitis and started on antibiotics.   Per sitter at bedside, she ws more confused in morning but this afternoon she has been more conversant. Per family she has had similar presentation in past due to some kidney issues.  CT head was done as part of ams workup and showed recent infarct in the right parietal white matter.Therefore neurology was consulted  LKN: no clear LKN as patient was brought in for ams Event happened at home No tpa or thrombectomy as outside window mRS 0  ROS: All other systems reviewed and negative except as noted in the HPI.   Past Medical History:  Diagnosis Date   Asthma    C. difficile colitis    Cervical stenosis of spine    Chronic headaches    Chronic neck pain    Coronary artery disease    Depression    Diverticulosis    Essential hypertension    Fibromyalgia    GERD (gastroesophageal reflux  disease)    History of Salmonella gastroenteritis    HNP (herniated nucleus pulposus), lumbar    Hyperlipidemia    IBS (irritable bowel syndrome)    Lung nodule    rheumatoid arthritis     Family History  Problem Relation Age of Onset   Heart disease Mother    Hypertension Mother    Asthma Mother    Heart disease Father    Stroke Father    Hypertension Father    Stroke Sister    Asthma Sister    Hypertension Brother    Asthma Brother    Asthma Maternal Grandmother    Heart disease Maternal Grandmother    Diabetes Maternal Grandfather    Asthma Maternal Aunt    Colon cancer Neg Hx     Social History:  reports that she quit smoking about 35 years ago. Her smoking use included cigarettes. She started smoking about 45 years ago. She has a 20 pack-year smoking history. She has never used smokeless tobacco. She reports that she does not drink alcohol and does not use drugs.   Facility-Administered Medications Prior to Admission  Medication Dose Route Frequency Provider Last Rate Last Admin   Benralizumab  SOSY 30 mg  30 mg Subcutaneous Q8 Weeks Gallagher, Joel Louis, MD   30 mg at 02/09/24 9082   Medications Prior to Admission  Medication Sig Dispense Refill Last Dose/Taking   albuterol  (PROAIR  HFA) 108 (90 Base) MCG/ACT inhaler Inhale 2 puffs into the lungs  every 4 (four) hours as needed for wheezing or shortness of breath. 18 g 3 Past Week   amLODipine  (NORVASC ) 10 MG tablet Take 10 mg by mouth daily.   04/11/2024 Evening   atorvastatin  (LIPITOR) 10 MG tablet Take 10 mg by mouth daily.   Past Week   benralizumab  (FASENRA ) 30 MG/ML prefilled syringe Inject 1 mL (30 mg total) into the skin every 8 (eight) weeks. 1 mL 6 Past Week   CALCIUM  PO Take 1,250 mg by mouth.   Past Week   cephALEXin (KEFLEX) 500 MG capsule Take 1,000 mg by mouth 2 (two) times daily.   Past Week   Certolizumab Pegol (CIMZIA [place]) Inject 1 Dose into the skin every 14 (fourteen) days.   Past Week   Cetirizine  HCl (ZYRTEC PO) Take 10 mg by mouth daily at 6 (six) AM.   Past Week   clindamycin (CLEOCIN T) 1 % lotion Apply 1 Application topically daily.   Past Week   colestipol  (COLESTID ) 1 g tablet Take 2 g by mouth daily. Take 4 hours before or after other medications.   Past Week   DULoxetine  (CYMBALTA ) 30 MG capsule Take 30 mg by mouth 2 (two) times daily.   Past Week   Ferrous Sulfate (IRON PO) Take 65 mg by mouth daily.   Past Week   fluticasone  (FLONASE ) 50 MCG/ACT nasal spray SPRAY 2 SPRAYS INTO EACH NOSTRIL EVERY DAY 48 mL 1 Past Week   Fluticasone -Umeclidin-Vilant (TRELEGY ELLIPTA ) 200-62.5-25 MCG/ACT AEPB Inhale 1 puff into the lungs in the morning. 90 each 3 Past Week   leflunomide  (ARAVA ) 20 MG tablet Take 20 mg by mouth daily.   04/12/2024 Morning   losartan -hydrochlorothiazide  (HYZAAR) 100-12.5 MG tablet Take 1 tablet by mouth daily.   04/11/2024 Evening   MAGNESIUM  PO Take 400 mg by mouth daily at 6 (six) AM.   Past Week   Multiple Vitamin (MULTIVITAMIN PO) Take 1 tablet by mouth daily at 6 (six) AM.   Past Week   pantoprazole  (PROTONIX ) 40 MG tablet Take 1 tablet (40 mg total) by mouth daily. 30 tablet 0 Past Week   potassium chloride  SA (KLOR-CON  M) 20 MEQ tablet Take 1 tablet (20 mEq total) by mouth daily. 6 tablet 0 Past Week   pregabalin  (LYRICA ) 50 MG capsule Take 50 mg by mouth 2 (two) times daily.   04/11/2024 Evening   RESTASIS  0.05 % ophthalmic emulsion Place 1 drop into both eyes 2 (two) times daily.   Past Week   vitamin B-12 (CYANOCOBALAMIN ) 1000 MCG tablet Take 1 tablet (1,000 mcg total) by mouth daily. 30 tablet 3 Past Week      Exam: Current vital signs: BP (!) 149/84 (BP Location: Right Arm)   Pulse (!) 106   Temp 97.8 F (36.6 C) (Oral)   Resp 19   Ht 5' 3 (1.6 m)   Wt 71 kg   SpO2 98%   BMI 27.73 kg/m  Vital signs in last 24 hours: Temp:  [97.6 F (36.4 C)-98.9 F (37.2 C)] 97.8 F (36.6 C) (09/16 1252) Pulse Rate:  [90-106] 106 (09/16 1252) Resp:  [14-21]  19 (09/16 1252) BP: (122-172)/(57-98) 149/84 (09/16 1252) SpO2:  [90 %-99 %] 98 % (09/16 1252)   Physical Exam  Constitutional: Appears well-developed and well-nourished.  Psych: Affect appropriate to situation Neuro: Aox3, follows commands, unable to name objects ( calls thumb: thimble, cannot name phone but picks correctly if given choices), rest of the CN grossly intact, antigravity  strength in all extremities, FTN intact  NIHSS 2  INPUTS: 1A: Level of consciousness --> 0 = Alert; keenly responsive 1B: Ask month and age --> 0 = Both questions right 1C: 'Blink eyes' & 'squeeze hands' --> 0 = Performs both tasks 2: Horizontal extraocular movements --> 0 = Normal 3: Visual fields --> 0 = No visual loss 4: Facial palsy --> 0 = Normal symmetry 5A: Left arm motor drift --> 0 = No drift for 10 seconds 5B: Right arm motor drift --> 0 = No drift for 10 seconds 6A: Left leg motor drift --> 0 = No drift for 5 seconds 6B: Right leg motor drift --> 0 = No drift for 5 seconds 7: Limb Ataxia --> 0 = No ataxia 8: Sensation --> 0 = Normal; no sensory loss 9: Language/aphasia --> 2 = Severe aphasia: fragmentary expression, inference needed, cannot identify materials 10: Dysarthria --> 0 = Normal 11: Extinction/inattention --> 0 = No abnormality   I have reviewed labs in epic and the results pertinent to this consultation are: CBC:  Recent Labs  Lab 04/12/24 1434 04/13/24 0452  WBC 18.7* 12.0*  NEUTROABS 12.9*  --   HGB 12.8 11.2*  HCT 38.1 34.8*  MCV 85.0 89.9  PLT 257 205    Basic Metabolic Panel:  Lab Results  Component Value Date   NA 141 04/13/2024   K 3.1 (L) 04/13/2024   CO2 21 (L) 04/13/2024   GLUCOSE 80 04/13/2024   BUN 14 04/13/2024   CREATININE 0.95 04/13/2024   CALCIUM  8.5 (L) 04/13/2024   GFRNONAA >60 04/13/2024   GFRAA 54 (L) 04/21/2019   Lipid Panel:  Lab Results  Component Value Date   LDLCALC 101 (H) 04/18/2019   HgbA1c:  Lab Results  Component Value  Date   HGBA1C 6.1 (H) 04/18/2019   Urine Drug Screen:     Component Value Date/Time   LABOPIA POSITIVE (A) 04/12/2024 1407   COCAINSCRNUR NONE DETECTED 04/12/2024 1407   LABBENZ NONE DETECTED 04/12/2024 1407   AMPHETMU NONE DETECTED 04/12/2024 1407   THCU NONE DETECTED 04/12/2024 1407   LABBARB NONE DETECTED 04/12/2024 1407    Alcohol Level     Component Value Date/Time   ETH <15 04/12/2024 1434     I have reviewed the images obtained:   CT Head without contrast 04/12/2024: Suspected recent infarct in the right parietal white matter. No intracranial hemorrhage. Chronic small vessel ischemia.     ASSESSMENT/PLAN: 78yo F with ams in setting of missed meds and sigmoid diverticulitis. Neurology consulted as [facility] showed recent infarct in the right parietal white matter   Acute ischemic stroke  - Recommend CTA head and neck to look for stenosis/occlusion - TTE to look for thrombus - Ideally would like to get MRI brain but patient may need sedation for that if she is unable to cooperate so will hold off for now - ASA 81mg  daily for sec stroke prevention - Lipid panel and A1c  - Start atorva 40mg  if LDL>70 - PT/OT - Goal BP: normotension  Acute encephalopathy - ddx include infectious encephalopathy vs missing meds - Low suspicion for rheumatoid meningitis given presentation and other sources of infection - check folate, RPR.  - Consider psych consult if hallucination remain an issue -resume home meds - Discussed with Dr Bryn via secure chat   Thank you for allowing us  to participate in the care of this patient. If you have any further questions, please contact  me or neurohospitalist.  Arlin Krebs Epilepsy [facility] neurohospitalist

## 2024-04-13 NOTE — Progress Notes (Signed)
 Mobility Specialist Progress Note:    04/13/24 1420  Mobility  Activity Ambulated with assistance  Level of Assistance Contact guard assist, steadying assist  Assistive Device Front wheel walker  Distance Ambulated (ft) 20 ft  Range of Motion/Exercises Active;All extremities  Activity Response Tolerated well  Mobility Referral Yes  Mobility visit 1 Mobility  Mobility Specialist Start Time (ACUTE ONLY) 1420  Mobility Specialist Stop Time (ACUTE ONLY) 1440  Mobility Specialist Time Calculation (min) (ACUTE ONLY) 20 min   Pt received in chair, requesting assistance to bathroom. Required CGA to stand and ambulate with RW. Tolerated well, pt pleasantly confused. Left pt in bed, NT in room. All needs met.  Tiah Heckel Mobility Specialist Please contact via Special educational needs teacher or  Rehab office at [phone]

## 2024-04-13 NOTE — Plan of Care (Signed)
 On-call neurologist note  I was called by Dr. Bryn regarding this patient CT angiography head and neck.  She has a new left M2 stenosis on the CTA that was done this evening.  The noncontrasted head CT, although read normal or unchanged, possibly has small hypodensity cortically in the left parietal area which likely is in the distribution of that occluded left MCA. The patient's last known well is well above 24 hours.  An MRI would be ideal but she is refusing at this time and also has some aphasia making comprehension somewhat difficult per the hospitalist. She was seen in consultation via telemedicine by Dr. Shelton this morning.  She has her plan in place for her. I will relay these findings to her to be followed Wednesday morning in follow-up consultation. At this time, the patient should be on aspirin  and I would also recommend starting Plavix  75 for secondary stroke prevention.  RONAL Lav, MD Neurology

## 2024-04-13 NOTE — Progress Notes (Signed)
 [facility] PROGRESS NOTE  Betty Golden (DOB: 09-13-1945) FMW:[id number] PCP: Irven Ozell DEL, MD  Brief Narrative: Betty Golden is a 78 y.o. female with a history of RA, HTN, COPD who presented to the ED on 04/12/2024 when family reported she was becoming increasingly confused. Formerly independent, she began walking around the house nude, hallucinating, not eating or drinking. She was afebrile, leukocytosis, hypokalemia, head CT showed suspected recent infarct in right parietal white matter. CT abd/pelvis showed mild sigmoid diverticulitis, descending and sigmoid colitis vs. nondistention. Antibiotics given, IVF given, and patient admitted. Did not tolerate MRI. Metabolic derangements have improved, leukocytosis improving. Mental status remains abnormal. Neurology consulted.   Subjective: Pt seems irritated, having interpersonal conflict with daughter in room, having a hard time speaking, uses incorrect words at times, lots of word finding difficulty. Denies weakness or numbness. Denies abdominal pain, N/V.   Objective: BP (!) 149/84 (BP Location: Right Arm)   Pulse (!) 106   Temp 97.8 F (36.6 C) (Oral)   Resp 19   Ht 5' 3 (1.6 m)   Wt 71 kg   SpO2 98%   BMI 27.73 kg/m   Gen: 78yo F sitting in chair in no distress Pulm: Nonlabored, clear  CV: RRR, no MRG or edema GI: Soft, NT, ND, +BS Neuro: Alert, incompletely oriented. To the extent she agreed to follow commands, has no focal weakness, no reported numbness. No dysarthria, speech comprehension intact, expression intact but abnormal. Ext: Warm, no deformities. +rheumatoid nodules Skin: No open rashes, lesions or ulcers on visualized skin   Assessment & Plan: Acute metabolic encephalopathy Confusion, hallucinations that started today, for oral intake of 2 days, diarrhea.  Head CT-suggest recent infarct, MRI could not be done due to poor patient cooperation.  CTAP WC also shows mild sigmoid diverticulitis/colitis.   Currently no pyelonephritis- but UA not suggestive of UTI. - Persistent despite improvement in leukocytosis, AKI, and hypokalemia. Possibly related to stroke (seen on CT and occult due to inability to get MRI). Has history of RA, no meningitic signs, no need for LP at this time per neurology. Will continue neurochecks and monitoring. May need sitter, ordered.  - Continue antibiotics, K supplementation.   Stroke:  - Start aspirin  81mg  - Check FLP, A1c - Check CTA head/neck. Appreciate neurology recommendations.    Acute sigmoid diverticulitis: Presenting with abdominal pain, leukocytosis of 18.7.  Rules out for sepsis. CT AP WC- Mild sigmoid colonic diverticulitis. No perforation or abscess. Descending and sigmoid colonic wall thickening versus nondistention, can be seen with colitis. - IV ceftriaxone  and metronidazole    Hypokalemia:  - Improving, continue monitoring.    RA: Reports generalized body aches.  But no obvious swelling of any joints. Has nodules. She is on leflunomide  and satralizumab injections q14days. - Resume leflunomide    Essential hypertension Stable. - Continue norvasc  - Will continue holding lisinopril and HCTZ   COPD: Quiescent Stable.  She is on Fasenra  injections every 8 weeks. - Resume home regimen.    Bernardino KATHEE Come, MD [facility] [email or web address] 04/13/2024, 4:40 PM

## 2024-04-13 NOTE — Plan of Care (Signed)
  Problem: Acute Rehab PT Goals(only PT should resolve) Goal: Patient Will Transfer Sit To/From Stand Outcome: Progressing Flowsheets (Taken 04/13/2024 1617) Patient will transfer sit to/from stand: with minimal assist Goal: Pt Will Transfer Bed To Chair/Chair To Bed Outcome: Progressing Flowsheets (Taken 04/13/2024 1617) Pt will Transfer Bed to Chair/Chair to Bed: with min assist Goal: Pt Will Ambulate Outcome: Progressing Flowsheets (Taken 04/13/2024 1617) Pt will Ambulate:  25 feet  with minimal assist  with rolling walker   Betty Golden, PT, DPT

## 2024-04-13 NOTE — Progress Notes (Signed)
 Pt is active with [facility]. Cory with [facility] aware of admission. TOC will follow.    04/13/24 0756  TOC Brief Assessment  Insurance and Status Reviewed  Patient has primary care physician Yes  Home environment has been reviewed Lives with daughter.  Prior level of function: Fairly independent.  Prior/Current Home Services Current home services [facility])  Social Drivers of Health Review SDOH reviewed no interventions necessary  Readmission risk has been reviewed Yes  Transition of care needs transition of care needs identified, TOC will continue to follow

## 2024-04-13 NOTE — Evaluation (Signed)
 Physical Therapy Evaluation Patient Details Name: Betty Golden MRN: [id number] DOB: 1946-01-09 Today's Date: 04/13/2024  History of Present Illness  Betty Golden is a 78 y.o. female with medical history significant for COPD, hypertension, rheumatoid arthritis, C. difficile colitis.  Patient presented to the ED with complaints of generalized weakness, dizziness, several falls, hallucinations that started today.  Per family, patient was walking around the house naked.  My evaluation, patient is awake, intermittently confused, she is able to answer few questions.  She reports poor oral intake over the past 2 days.  No vomiting.  She reports 3 diarrheal stools today.  No cough no difficulty breathing no chest pain.  She reports generalized body aches.  I talked to patient's daughter, confirmed the above, and that patient had not taken her medications in 2 days.  At baseline patient has no memory deficits.   Clinical Impression  Pt was agreeable to PT and OT co-evaluation. She was found seated in the chair with sitter present. She exhibited cognitive deficits throughout as evidenced by her difficulty finding words, orientation, safety, and remembering her living situation. She required modA with sit to stand and ambulation for safety as she exhibited difficulty maintaining knee extension while performing stance phase on her LLE. She is at a high fall risk as evidenced by her gait deviations and her cognitive deficits. She was left in the chair with the call bell within reach, chair alarm set, and sitter present. Patient will benefit from continued skilled physical therapy in hospital and recommended venue below to increase strength, balance, endurance for safe ADLs and gait.         If plan is discharge home, recommend the following: A lot of help with walking and/or transfers;A lot of help with bathing/dressing/bathroom;Assistance with cooking/housework;Assist for transportation;Supervision due to  cognitive status   Can travel by private vehicle        Equipment Recommendations None recommended by PT  Recommendations for Other Services       Functional Status Assessment Patient has had a recent decline in their functional status and demonstrates the ability to make significant improvements in function in a reasonable and predictable amount of time.     Precautions / Restrictions Precautions Precautions: Fall Recall of Precautions/Restrictions: Impaired Restrictions Weight Bearing Restrictions Per Provider Order: No      Mobility  Bed Mobility Overal bed mobility: Needs Assistance Bed Mobility: Supine to Sit, Sit to Supine     Supine to sit: Independent Sit to supine: Used rails, Min assist        Transfers Overall transfer level: Needs assistance Equipment used: Rolling walker (2 wheels) Transfers: Sit to/from Stand, Bed to chair/wheelchair/BSC Sit to Stand: Mod assist   Step pivot transfers: Mod assist            Ambulation/Gait Ambulation/Gait assistance: Mod assist Gait Distance (Feet): 10 Feet Assistive device: Rolling walker (2 wheels) Gait Pattern/deviations: Step-through pattern, Decreased stride length, Knees buckling Gait velocity: decreased     General Gait Details: Pt experienced difficulty maintaining knee extension in stance phase on the LLE  Stairs            Wheelchair Mobility     Tilt Bed    Modified Rankin (Stroke Patients Only)       Balance Overall balance assessment: Needs assistance Sitting-balance support: Feet supported, No upper extremity supported Sitting balance-Leahy Scale: Fair Sitting balance - Comments: seated in chair   Standing balance support: Bilateral upper extremity supported, During  functional activity, Reliant on assistive device for balance Standing balance-Leahy Scale: Poor Standing balance comment: with RW                             Pertinent Vitals/Pain Pain  Assessment Pain Assessment: No/denies pain    Home Living Family/patient expects to be discharged to:: Private residence Living Arrangements: Children Available Help at Discharge: Family;Available 24 hours/day Type of Home: House Home Access: Ramped entrance       Home Layout: Two level;Able to live on main level with bedroom/bathroom;Full bath on main level Home Equipment: Rolling Walker (2 wheels);Wheelchair - manual;Cane - single point;BSC/3in1      Prior Function Prior Level of Function : Needs assist       Physical Assist : Mobility (physical) Mobility (physical): Bed mobility;Transfers;Gait   Mobility Comments: household ambulator with no AD, community ambulator with [facility] ADLs Comments: assited by family     Extremity/Trunk Assessment   Upper Extremity Assessment Upper Extremity Assessment: Defer to OT evaluation    Lower Extremity Assessment Lower Extremity Assessment: Generalized weakness;Difficult to assess due to impaired cognition    Cervical / Trunk Assessment Cervical / Trunk Assessment: Normal  Communication   Communication Communication: Impaired Factors Affecting Communication: Other (comment) (difficulty finding words)    Cognition Arousal: Alert Behavior During Therapy: Impulsive   PT - Cognitive impairments: Orientation, Memory, Safety/Judgement, No family/caregiver present to determine baseline   Orientation impairments: Time, Person                   PT - Cognition Comments: Pt was unable to recall her last name, the year, living situation, and experienced difficulty with word finding Following commands: Impaired Following commands impaired: Follows one step commands inconsistently     Cueing Cueing Techniques: Verbal cues, Gestural cues, Tactile cues, Visual cues     General Comments      Exercises     Assessment/Plan    PT Assessment Patient needs continued PT services  PT Problem List Decreased strength;Decreased range  of motion;Decreased activity tolerance;Decreased balance;Decreased mobility;Decreased coordination;Decreased cognition;Decreased safety awareness       PT Treatment Interventions DME instruction;Gait training;Stair training;Functional mobility training;Therapeutic activities;Therapeutic exercise;Balance training;Patient/family education    PT Goals (Current goals can be found in the Care Plan section)  Acute Rehab PT Goals Patient Stated Goal: Pt will be able to safely return home. PT Goal Formulation: With patient Time For Goal Achievement: 04/27/24 Potential to Achieve Goals: Good    Frequency Min 3X/week     Co-evaluation PT/OT/SLP Co-Evaluation/Treatment: Yes Reason for Co-Treatment: To address functional/ADL transfers PT goals addressed during session: Mobility/safety with mobility;Balance         AM-PAC PT 6 Clicks Mobility  Outcome Measure Help needed turning from your back to your side while in a flat bed without using bedrails?: A Little Help needed moving from lying on your back to sitting on the side of a flat bed without using bedrails?: A Lot Help needed moving to and from a bed to a chair (including a wheelchair)?: A Lot Help needed standing up from a chair using your arms (e.g., wheelchair or bedside chair)?: A Little Help needed to walk in hospital room?: A Lot Help needed climbing 3-5 steps with a railing? : Total 6 Click Score: 13    End of Session   Activity Tolerance: Patient tolerated treatment well Patient left: in chair;with chair alarm set;with call bell/phone within reach;with  nursing/sitter in room Nurse Communication: Mobility status PT Visit Diagnosis: Unsteadiness on feet (R26.81);Muscle weakness (generalized) (M62.81);Difficulty in walking, not elsewhere classified (R26.2)    Time: 8479-8456 PT Time Calculation (min) (ACUTE ONLY): 23 min   Charges:   PT Evaluation $PT Eval Moderate Complexity: 1 Mod PT Treatments $Therapeutic Activity:  8-22 mins          Lacinda Fass, PT, DPT  04/13/2024, 4:09 PM

## 2024-04-14 ENCOUNTER — Inpatient Hospital Stay (HOSPITAL_COMMUNITY)

## 2024-04-14 ENCOUNTER — Other Ambulatory Visit (HOSPITAL_COMMUNITY): Payer: Self-pay | Admitting: *Deleted

## 2024-04-14 DIAGNOSIS — I6389 Other cerebral infarction: Secondary | ICD-10-CM

## 2024-04-14 DIAGNOSIS — G9341 Metabolic encephalopathy: Secondary | ICD-10-CM | POA: Diagnosis not present

## 2024-04-14 LAB — ECHOCARDIOGRAM COMPLETE
AR max vel: 1.11 cm2
AV Area VTI: 1.48 cm2
AV Area mean vel: 1.24 cm2
AV Mean grad: 7 mmHg
AV Peak grad: 14.1 mmHg
Ao pk vel: 1.88 m/s
Area-P 1/2: 6.9 cm2
Calc EF: 70.8 %
Height: 63 in
MV VTI: 1.54 cm2
S' Lateral: 2.7 cm
Single Plane A2C EF: 70.5 %
Single Plane A4C EF: 70.7 %
Weight: 2504.43 [oz_av]

## 2024-04-14 LAB — LIPID PANEL
Cholesterol: 175 mg/dL (ref 0–200)
HDL: 60 mg/dL (ref >40)
LDL Cholesterol: 98 mg/dL (ref 0–99)
Total CHOL/HDL Ratio: 2.9 ratio
Triglycerides: 87 mg/dL (ref <150)
VLDL: 17 mg/dL (ref 0–40)

## 2024-04-14 LAB — CBC
HCT: 37.8 % (ref 36.0–46.0)
Hemoglobin: 12.3 g/dL (ref 12.0–15.0)
MCH: 29.3 pg (ref 26.0–34.0)
MCHC: 32.5 g/dL (ref 30.0–36.0)
MCV: 90 fL (ref 80.0–100.0)
Platelets: 202 K/uL (ref 150–400)
RBC: 4.2 MIL/uL (ref 3.87–5.11)
RDW: 14.1 % (ref 11.5–15.5)
WBC: 13.2 K/uL — ABNORMAL HIGH (ref 4.0–10.5)
nRBC: 0 % (ref 0.0–0.2)

## 2024-04-14 LAB — COMPREHENSIVE METABOLIC PANEL WITH GFR
ALT: 22 U/L (ref 0–44)
AST: 37 U/L (ref 15–41)
Albumin: 3.3 g/dL — ABNORMAL LOW (ref 3.5–5.0)
Alkaline Phosphatase: 46 U/L (ref 38–126)
Anion gap: 13 (ref 5–15)
BUN: 10 mg/dL (ref 8–23)
CO2: 17 mmol/L — ABNORMAL LOW (ref 22–32)
Calcium: 9.1 mg/dL (ref 8.9–10.3)
Chloride: 111 mmol/L (ref 98–111)
Creatinine, Ser: 1 mg/dL (ref 0.44–1.00)
GFR, Estimated: 58 mL/min — ABNORMAL LOW (ref >60)
Glucose, Bld: 72 mg/dL (ref 70–99)
Potassium: 3.2 mmol/L — ABNORMAL LOW (ref 3.5–5.1)
Sodium: 141 mmol/L (ref 135–145)
Total Bilirubin: 0.6 mg/dL (ref 0.0–1.2)
Total Protein: 6.5 g/dL (ref 6.5–8.1)

## 2024-04-14 LAB — HEMOGLOBIN A1C
Hgb A1c MFr Bld: 4.7 % — ABNORMAL LOW (ref 4.8–5.6)
Mean Plasma Glucose: 88.19 mg/dL

## 2024-04-14 LAB — MAGNESIUM: Magnesium: 2 mg/dL (ref 1.7–2.4)

## 2024-04-14 MED ORDER — POTASSIUM CHLORIDE 20 MEQ PO PACK
40.0000 meq | PACK | Freq: Once | ORAL | Status: AC
  Administered 2024-04-14: 40 meq via ORAL
  Filled 2024-04-14: qty 2

## 2024-04-14 MED ORDER — LORAZEPAM 2 MG/ML IJ SOLN
1.0000 mg | Freq: Once | INTRAMUSCULAR | Status: AC
  Administered 2024-04-14: 1 mg via INTRAVENOUS
  Filled 2024-04-14: qty 1

## 2024-04-14 MED ORDER — ROSUVASTATIN CALCIUM 20 MG PO TABS
20.0000 mg | ORAL_TABLET | Freq: Every day | ORAL | Status: DC
Start: 2024-04-14 — End: 2024-04-17
  Administered 2024-04-14 – 2024-04-17 (×4): 20 mg via ORAL
  Filled 2024-04-14 (×4): qty 1

## 2024-04-14 MED ORDER — SODIUM CHLORIDE 0.9 % IV SOLN: INTRAVENOUS | Status: DC

## 2024-04-14 MED ORDER — TRAZODONE HCL 50 MG PO TABS
25.0000 mg | ORAL_TABLET | Freq: Every day | ORAL | Status: DC
Start: 2024-04-14 — End: 2024-04-17
  Administered 2024-04-14 – 2024-04-16 (×3): 25 mg via ORAL
  Filled 2024-04-14 (×3): qty 1

## 2024-04-14 NOTE — Plan of Care (Signed)
  Problem: Education: Goal: Knowledge of General Education information will improve Description: Including pain rating scale, medication(s)/side effects and non-pharmacologic comfort measures 04/14/2024 1449 by Dionisio Tinnie LABOR, RN Outcome: Progressing 04/14/2024 1449 by Dionisio Tinnie LABOR, RN Outcome: Progressing   Problem: Health Behavior/Discharge Planning: Goal: Ability to manage health-related needs will improve 04/14/2024 1449 by Dionisio Tinnie LABOR, RN Outcome: Progressing 04/14/2024 1449 by Dionisio Tinnie LABOR, RN Outcome: Progressing   Problem: Clinical Measurements: Goal: Ability to maintain clinical measurements within normal limits will improve 04/14/2024 1449 by Dionisio Tinnie LABOR, RN Outcome: Progressing 04/14/2024 1449 by Dionisio Tinnie LABOR, RN Outcome: Progressing Goal: Will remain free from infection 04/14/2024 1449 by Dionisio Tinnie LABOR, RN Outcome: Progressing 04/14/2024 1449 by Dionisio Tinnie LABOR, RN Outcome: Progressing Goal: Diagnostic test results will improve 04/14/2024 1449 by Dionisio Tinnie LABOR, RN Outcome: Progressing 04/14/2024 1449 by Dionisio Tinnie LABOR, RN Outcome: Progressing Goal: Respiratory complications will improve 04/14/2024 1449 by Dionisio Tinnie LABOR, RN Outcome: Progressing 04/14/2024 1449 by Dionisio Tinnie LABOR, RN Outcome: Progressing Goal: Cardiovascular complication will be avoided 04/14/2024 1449 by Dionisio Tinnie LABOR, RN Outcome: Progressing 04/14/2024 1449 by Dionisio Tinnie LABOR, RN Outcome: Progressing

## 2024-04-14 NOTE — Hospital Course (Signed)
 Betty Golden is a 78 y.o. female with a history of RA, HTN, COPD who presented to the ED on 04/12/2024 when family reported she was becoming increasingly confused. Formerly independent, she began walking around the house nude, hallucinating, not eating or drinking. She was afebrile, leukocytosis, hypokalemia, head CT showed suspected recent infarct in right parietal white matter. CT abd/pelvis showed mild sigmoid diverticulitis, descending and sigmoid colitis vs. nondistention. Antibiotics given, IVF given, and patient admitted. Did not tolerate MRI. Metabolic derangements have improved, leukocytosis improving. Mental status remains abnormal. Neurology consulted.

## 2024-04-14 NOTE — PMR Pre-admission (Signed)
 PMR Admission Coordinator Pre-Admission Assessment  Patient: Betty Golden is an 78 y.o., female MRN: [id number] DOB: 10-May-1946 Height: 5' 3 (160 cm) Weight: 71 kg  Insurance Information HMO:     PPO: yes     PCP:      IPA:      80/20:      OTHER:  PRIMARY: BCBS Medicare     Policy#:  [id number]      Subscriber:  CM Name: TBD      Phone#: [phone]     Fax#: [phone] Pre-Cert#: [id number]  Received insurance authorization via [place] . Pt approved from 04/15/24-04/19/24. Review date is 04/19/24.     Employer:  Benefits:  Phone #:      Name:  Eff Date: 07/29/2014 - 12/31/999 Deductible: $500($500 met) OOP Max: $3,000 ($3,000 met)  CIR: 100% coverage SNF: 100% coverage, limited to 100 days/cal yr (100 remaining) Outpatient: 100% coverage Home Health: 100% coverage Providers: in Theme park manager Counselor:       Phone#:   The Engineer, materials Information Summary" for patients in Inpatient Rehabilitation Facilities with attached "Privacy Act Statement-Health Care Records" was provided and verbally reviewed with: Patient  Emergency Contact Information Contact Information     Name Relation Home Work Mobile   Maxwell,Nicole Daughter   [phone]   BRYAN NORMAN Rom [phone]  [phone]   BRYAN Denece Rom [phone]  [phone]      Other Contacts   None on File     Current Medical History  Patient Admitting Diagnosis: CVA History of Present Illness: JESENYA BOWDITCH is a 78 year old female with past medical history of asthma, headaches, chronic neck pain, CAD, depression, hypertension, fibromyalgia, GERD, IBS, hyperlipidemia, rheumatoid arthritis, COPD who was admitted to [facility] 04/12/24 after she was found to have generalized weakness, dizziness several falls and confusion.  Treated for acute metabolic encephalopathy, mentation appears to be gradually improved.  Patient was noted to not be taking her meds  for about 2 days.  Patient was diagnosed with sigmoid diverticulitis and was started on antibiotics.  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  She has not been able to tolerate MRI of the brain.  Neurology recommending aspirin  and Plavix  75 mg for secondary stroke prevention.  Seen by PT/OT and they recommend CIR to assist return to PLOF.  Complete NIHSS TOTAL: 5  Patient's medical record from [facility]  has been reviewed by the rehabilitation admission coordinator and physician.  Past Medical History  Past Medical History:  Diagnosis Date   Asthma    C. difficile colitis    Cervical stenosis of spine    Chronic headaches    Chronic neck pain    Coronary artery disease    Depression    Diverticulosis    Essential hypertension    Fibromyalgia    GERD (gastroesophageal reflux disease)    History of Salmonella gastroenteritis    HNP (herniated nucleus pulposus), lumbar    Hyperlipidemia    IBS (irritable bowel syndrome)    Lung nodule    rheumatoid arthritis     Has the patient had major surgery during 100 days prior to admission? No  Family History   family history includes Asthma in her brother, maternal aunt, maternal grandmother, mother, and sister; Diabetes in her maternal grandfather; Heart disease in her father, maternal grandmother, and mother; Hypertension in  her brother, father, and mother; Stroke in her father and sister.  Current Medications  Current Facility-Administered Medications:    acetaminophen  (TYLENOL ) tablet 650 mg, 650 mg, Oral, Q6H PRN, 650 mg at 04/16/24 0224 **OR** acetaminophen  (TYLENOL ) suppository 650 mg, 650 mg, Rectal, Q6H PRN, Emokpae, Ejiroghene E, MD   amLODipine  (NORVASC ) tablet 10 mg, 10 mg, Oral, Daily, Shahmehdi, Seyed A, MD, 10 mg at 04/16/24 1001   aspirin  EC tablet 81 mg, 81 mg, Oral, Daily, Bryn Motto B, MD, 81  mg at 04/16/24 1001   budesonide -glycopyrrolate -formoterol  (BREZTRI ) 160-9-4.8 MCG/ACT inhaler 2 puff, 2 puff, Inhalation, BID, Emokpae, Ejiroghene E, MD, 2 puff at 04/17/24 0748   ciprofloxacin  (CIPRO ) tablet 500 mg, 500 mg, Oral, BID, Shahmehdi, Seyed A, MD, 500 mg at 04/16/24 2100   clopidogrel  (PLAVIX ) tablet 75 mg, 75 mg, Oral, Daily, Bryn Motto B, MD, 75 mg at 04/16/24 1001   enoxaparin  (LOVENOX ) injection 40 mg, 40 mg, Subcutaneous, Q24H, Emokpae, Ejiroghene E, MD, 40 mg at 04/16/24 2105   haloperidol  lactate (HALDOL ) injection 1 mg, 1 mg, Intravenous, Once, Francesca Elsie CROME, MD   haloperidol  lactate (HALDOL ) injection 1-2 mg, 1-2 mg, Intramuscular, Q6H PRN, Mansy, Jan A, MD, 2 mg at 04/15/24 0234   leflunomide  (ARAVA ) tablet 20 mg, 20 mg, Oral, Daily, Emokpae, Ejiroghene E, MD, 20 mg at 04/16/24 1002   LORazepam  (ATIVAN ) tablet 0.5 mg, 0.5 mg, Oral, Q4H PRN, Mansy, Jan A, MD, 0.5 mg at 04/16/24 2100   metroNIDAZOLE  (FLAGYL ) tablet 500 mg, 500 mg, Oral, Q8H, Shahmehdi, Seyed A, MD, 500 mg at 04/17/24 0505   ondansetron  (ZOFRAN ) tablet 4 mg, 4 mg, Oral, Q6H PRN **OR** ondansetron  (ZOFRAN ) injection 4 mg, 4 mg, Intravenous, Q6H PRN, Emokpae, Ejiroghene E, MD   Oral care mouth rinse, 15 mL, Mouth Rinse, PRN, Bryn Motto B, MD   rosuvastatin  (CRESTOR ) tablet 20 mg, 20 mg, Oral, Daily, Shahmehdi, Seyed A, MD, 20 mg at 04/16/24 1001   traZODone  (DESYREL ) tablet 25 mg, 25 mg, Oral, QHS, Shahmehdi, Seyed A, MD, 25 mg at 04/16/24 2100  Patients Current Diet:  Diet Order             Diet - low sodium heart healthy           Diet Heart Room service appropriate? Yes; Fluid consistency: Thin  Diet effective now                   Precautions / Restrictions Precautions Precautions: Fall Restrictions Weight Bearing Restrictions Per Provider Order: No   Has the patient had 2 or more falls or a fall with injury in the past year? Yes  Prior Activity Level Community (5-7x/wk): Pt.   active in the community PTA  Prior Functional Level Self Care: Did the patient need help bathing, dressing, using the toilet or eating? Independent  Indoor Mobility: Did the patient need assistance with walking from room to room (with or without device)? Independent  Stairs: Did the patient need assistance with internal or external stairs (with or without device)? Independent  Functional Cognition: Did the patient need help planning regular tasks such as shopping or remembering to take medications? Independent  Patient Information Are you of Hispanic, Latino/a,or Spanish origin?: A. No, not of Hispanic, Latino/a, or Spanish origin What is your race?: Z. None of the above Do you need or want an interpreter to communicate with a doctor or health care staff?: 0. No Patient information obtained via proxy : yes  Patient's Response  To:  Health Literacy and Transportation Is the patient able to respond to health literacy and transportation needs?: No Health Literacy - How often do you need to have someone help you when you read instructions, pamphlets, or other written material from your doctor or pharmacy?: Patient unable to respond In the past 12 months, has lack of transportation kept you from medical appointments or from getting medications?: No In the past 12 months, has lack of transportation kept you from meetings, work, or from getting things needed for daily living?: No Higher education careers adviser obtained via proxy: tes  Journalist, newspaper / Equipment Home Equipment: Agricultural consultant (2 wheels), Wheelchair - manual, Medical laboratory scientific officer - single point, BSC/3in1  Prior Device Use: Indicate devices/aids used by the patient prior to current illness, exacerbation or injury? Walker  Current Functional Level Cognition  Overall Cognitive Status: Difficult to assess Orientation Level: Oriented to person, Oriented to place    Extremity Assessment (includes Sensation/Coordination)  Upper  Extremity Assessment: LUE deficits/detail LUE Deficits / Details: 4/5 grossly other than grasp which was 4+/5; deficits in FM and GM coordination. Difficult to accurately gauge sensation given pt's cognition. LUE Sensation:  (continue to assess.) LUE Coordination: decreased fine motor, decreased gross motor  Lower Extremity Assessment: Defer to PT evaluation    ADLs  Overall ADL's : Needs assistance/impaired Eating/Feeding: Set up, Sitting Eating/Feeding Details (indicate cue type and reason): finishing snack and drink at beginning of session Grooming: Set up Grooming Details (indicate cue type and reason): Provided washcloth, able to wash face and hands with no assist. Upper Body Bathing: Contact guard assist, Sitting, Supervision/ safety Lower Body Bathing: Supervison/ safety, Contact guard assist, Sitting/lateral leans Upper Body Dressing : Supervision/safety, Contact guard assist, Sitting Lower Body Dressing: Supervision/safety, Contact guard assist, Sitting/lateral leans Lower Body Dressing Details (indicate cue type and reason): Able to doff and don L sock seated in the chair. Toilet Transfer: Moderate assistance, Minimal assistance, Ambulation, Rolling walker (2 wheels) Toilet Transfer Details (indicate cue type and reason): increased time and effort, initially mod A, improving to min A with slight R sided lean Toileting- Clothing Manipulation and Hygiene: Contact guard assist, Minimal assistance, Sitting/lateral lean Toileting - Clothing Manipulation Details (indicate cue type and reason): Needs assist with clothing management Tub/ Shower Transfer: Moderate assistance, Ambulation, Rolling walker (2 wheels) Functional mobility during ADLs: Moderate assistance, Rolling walker (2 wheels) General ADL Comments: Pt appears to be improving with mobility and cognition.    Mobility  Overal bed mobility: Needs Assistance Bed Mobility: Supine to Sit, Sit to Supine Supine to sit:  Supervision Sit to supine: Used rails, Min assist General bed mobility comments: Up in recliner on entry    Transfers  Overall transfer level: Needs assistance Equipment used: Rolling walker (2 wheels) Transfers: Sit to/from Stand, Bed to chair/wheelchair/BSC Sit to Stand: Mod assist Bed to/from chair/wheelchair/BSC transfer type:: Step pivot Step pivot transfers: Mod assist, Min assist General transfer comment: increased effort and time, improving to min with prolonged standing    Ambulation / Gait / Stairs / Wheelchair Mobility  Ambulation/Gait Ambulation/Gait assistance: Mod assist Gait Distance (Feet): 10 Feet Assistive device: Rolling walker (2 wheels) Gait Pattern/deviations: Step-through pattern, Decreased stride length, Knees buckling General Gait Details: Pt experienced difficulty maintaining knee extension in stance phase on the LLE Gait velocity: decreased    Posture / Balance Dynamic Sitting Balance Sitting balance - Comments: seated in chair Balance Overall balance assessment: Needs assistance Sitting-balance support: Feet supported, No upper extremity supported  Sitting balance-Leahy Scale: Good Sitting balance - Comments: seated in chair Standing balance support: Reliant on assistive device for balance, During functional activity Standing balance-Leahy Scale: Poor Standing balance comment: with RW; very poor with SPC    Special considerations/life events  Skin Blister: labia/bilateral; Ecchymosis: arm/bilateral; Erythema/Redness: groin/bilateral    Previous Home Environment (from acute therapy documentation) Living Arrangements: Children  Lives With: Family Available Help at Discharge: Family, Available 24 hours/day Type of Home: House Home Layout: Two level, Able to live on main level with bedroom/bathroom, Full bath on main level Home Access: Ramped entrance Bathroom Shower/Tub: Health visitor: Standard Bathroom Accessibility: Yes Home Care  Services: No Additional Comments: per chart; pt is a poor historian  Discharge Living Setting Plans for Discharge Living Setting: House Type of Home at Discharge: House Discharge Home Layout: Multi-level Alternate Level Stairs-Rails: Right Alternate Level Stairs-Number of Steps: 5 Discharge Home Access: Stairs to enter Entrance Stairs-Rails: Right Entrance Stairs-Number of Steps: 4 Discharge Bathroom Shower/Tub: Walk-in shower Discharge Bathroom Toilet: Standard Discharge Bathroom Accessibility: Yes How Accessible: Accessible via walker, Accessible via wheelchair Does the patient have any problems obtaining your medications?: No  Social/Family/Support Systems Patient Roles: Other (Comment) Contact Information: [phone] Anticipated Caregiver: [name] Hugger (daughter) Caregiver Availability: 24/7 Discharge Plan Discussed with Primary Caregiver: Yes Is Caregiver In Agreement with Plan?: Yes Does Caregiver/Family have Issues with Lodging/Transportation while Pt is in Rehab?: No  Goals Patient/Family Goal for Rehab: PT/OT/SLP supervision Expected length of stay: 10-12 days Pt/Family Agrees to Admission and willing to participate: Yes Program Orientation Provided & Reviewed with Pt/Caregiver Including Roles  & Responsibilities: Yes  Decrease burden of Care through IP rehab admission: Not anticipated  Possible need for SNF placement upon discharge: not anticipated  Patient Condition: I have reviewed medical records from [facility] , spoken with CM, and patient. I met with patient at the bedside for inpatient rehabilitation assessment.  Patient will benefit from ongoing PT, OT, and SLP, can actively participate in 3 hours of therapy a day 5 days of the week, and can make measurable gains during the admission.  Patient will also benefit from the coordinated team approach during an Inpatient Acute Rehabilitation admission.  The patient will receive intensive therapy as well  as Rehabilitation physician, nursing, social worker, and care management interventions.  Due to safety, skin/wound care, disease management, medication administration, pain management, and patient education the patient requires 24 hour a day rehabilitation nursing.  The patient is currently Mod A with mobility and CGA-Min A with basic ADLs.  Discharge setting and therapy post discharge at home with home health is anticipated.  Patient has agreed to participate in the Acute Inpatient Rehabilitation Program and will admit today.  Preadmission Screen Completed By:  Leita KATHEE Kleine, 04/17/2024 9:42 AM ______________________________________________________________________   Discussed status with Dr. Urbano on 04/17/24  at 9:42 AM and received approval for admission today.  Admission Coordinator:  Leita KATHEE Kleine, CCC-SLP, time 9:42 AM/Date 04/17/24    Assessment/Plan: Diagnosis: cva Does the need for close, 24 hr/day Medical supervision in concert with the patient's rehab needs make it unreasonable for this patient to be served in a less intensive setting? Yes Co-Morbidities requiring supervision/potential complications: encephalopathy, diverticulitis, Hypokalemia, RA, HTN, COPD Due to bladder management, bowel management, safety, skin/wound care, disease management, medication administration, pain management, and patient education, does the patient require 24 hr/day rehab nursing? Yes Does the patient require coordinated care of a physician, rehab nurse, PT, OT, and SLP  to address physical and functional deficits in the context of the above medical diagnosis(es)? Yes Addressing deficits in the following areas: balance, endurance, locomotion, strength, transferring, bowel/bladder control, bathing, dressing, feeding, grooming, toileting, cognition, speech, language, swallowing, and psychosocial support Can the patient actively participate in an intensive therapy program of at least 3 hrs of therapy 5  days a week? Yes The potential for patient to make measurable gains while on inpatient rehab is excellent Anticipated functional outcomes upon discharge from inpatient rehab: supervision PT, supervision OT, supervision SLP Estimated rehab length of stay to reach the above functional goals is: 10-12 days Anticipated discharge destination: Home 10. Overall Rehab/Functional Prognosis: excellent   MD Signature: Murray Collier

## 2024-04-14 NOTE — Progress Notes (Signed)

## 2024-04-14 NOTE — Progress Notes (Addendum)
 Patient refused Mri , Dr. Maree made aware

## 2024-04-14 NOTE — Progress Notes (Signed)
 Agam J Seckinger is a 78 year old female with past medical history of asthma, headaches, chronic neck pain, CAD, depression, hypertension, fibromyalgia, GERD, IBS, hyperlipidemia, rheumatoid arthritis, COPD who was admitted after she was found to have generalized weakness, dizziness several falls and confusion.  Treated for acute metabolic encephalopathy, mentation appears to be gradually improved.  Patient was noted to not be taking her meds for about 2 days.  Patient was diagnosed with sigmoid diverticulitis and was started on antibiotics.  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  She has not been able to tolerate MRI of the brain.  Neurology recommending aspirin  and Plavix  75 mg for secondary stroke prevention.  Patient was seen by PT and OT, required mod assist to ambulate 10 feet with rolling walker.  SLP eval has been ordered.  Patient appeared candidate for CIR, rehab coordinator to follow-up.

## 2024-04-14 NOTE — Evaluation (Signed)
 Speech Language Pathology Evaluation Patient Details Name: Betty Golden MRN: [id number] DOB: 08/22/1945 Today's Date: 04/14/2024 Time:  -     Problem List:  Patient Active Problem List   Diagnosis Date Noted   Acute metabolic encephalopathy 04/12/2024   Dizziness 02/27/2024   Dyslipidemia 02/27/2024   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   Fecal incontinence 02/06/2023   Malnutrition of moderate degree 09/27/2021   Acute respiratory failure with hypoxia (HCC)    Acute urinary retention 09/26/2021   Aspiration pneumonia (HCC) 09/26/2021   RA (rheumatoid arthritis) (HCC) 09/25/2021   Leukocytosis 09/25/2021   Viral pneumonia 04/20/2019   TIA (transient ischemic attack) 04/16/2019   Dyspnea 04/04/2019   COVID-19 virus infection 04/04/2019   Gastritis and gastroduodenitis 07/08/2018   Rectal bleeding 06/29/2018   Fibromyalgia 09/08/2017   Spinal stenosis of lumbar region 09/20/2015   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/03/2015   Moderate persistent asthma 07/03/2015   Allergic rhinitis 07/03/2015   Colitis due to Clostridium difficile 02/10/2015   Abdominal pain 02/10/2015   Nausea and vomiting 02/10/2015   AKI (acute kidney injury) (HCC) 02/10/2015   Dehydration 02/10/2015   Hypoxia 02/10/2015   Hyponatremia 02/10/2015   Hypokalemia 02/10/2015   COPD (chronic obstructive pulmonary disease) (HCC)    Constipation 07/15/2013   DYSPNEA 04/05/2008   Essential hypertension 03/10/2008   Headache 03/10/2008   Cough 03/10/2008   Past Medical History:  Past Medical History:  Diagnosis Date   Asthma    C. difficile colitis    Cervical stenosis of spine    Chronic headaches    Chronic neck pain    Coronary artery disease    Depression    Diverticulosis    Essential hypertension    Fibromyalgia    GERD (gastroesophageal reflux disease)    History of Salmonella gastroenteritis    HNP (herniated nucleus pulposus), lumbar    Hyperlipidemia    IBS (irritable bowel syndrome)    Lung nodule     rheumatoid arthritis    Past Surgical History:  Past Surgical History:  Procedure Laterality Date   APPENDECTOMY  1965   BIOPSY  08/26/2018   Procedure: BIOPSY;  Surgeon: Golda Claudis PENNER, MD;  Location: [facility];  Service: Endoscopy;;  gastric   BREAST BIOPSY     BREAST REDUCTION SURGERY  1994   CATARACT EXTRACTION W/PHACO Left 06/09/2023   Procedure: CATARACT EXTRACTION PHACO AND INTRAOCULAR LENS PLACEMENT (IOC);  Surgeon: Harrie Agent, MD;  Location: [facility];  Service: Ophthalmology;  Laterality: Left;  CDE 7.03   CATARACT EXTRACTION W/PHACO Right 06/23/2023   Procedure: CATARACT EXTRACTION PHACO AND INTRAOCULAR LENS PLACEMENT (IOC);  Surgeon: Harrie Agent, MD;  Location: [facility];  Service: Ophthalmology;  Laterality: Right;  CDE: 11.55   CHOLECYSTECTOMY  1975   COLONOSCOPY N/A 08/19/2013   Procedure: COLONOSCOPY;  Surgeon: Claudis PENNER Golda, MD;  Location: [facility];  Service: Endoscopy;  Laterality: N/A;  155-moved to 140 Ann to notify pt   COLONOSCOPY N/A 08/26/2018   Procedure: COLONOSCOPY;  Surgeon: Golda Claudis PENNER, MD;  Location: [facility];  Service: Endoscopy;  Laterality: N/A;   ESOPHAGOGASTRODUODENOSCOPY N/A 08/26/2018   Procedure: ESOPHAGOGASTRODUODENOSCOPY (EGD);  Surgeon: Golda Claudis PENNER, MD;  Location: [facility];  Service: Endoscopy;  Laterality: N/A;   fibromyalgia     LUMBAR LAMINECTOMY/DECOMPRESSION MICRODISCECTOMY Bilateral 09/20/2015   Procedure: MICRO LUMBAR DECOMPRESSION L5-S1 BILATERAL    (1 LEVEL);  Surgeon: Reyes Billing, MD;  Location: [facility];  Service: Orthopedics;  Laterality: Bilateral;  rheumatoid arthritis     Sinus Surgery     TONSILLECTOMY  1968   HPI:  EVELY GAINEY is a 78 year old female with past medical history of asthma, headaches, chronic neck pain, CAD, depression, hypertension, fibromyalgia, GERD, IBS, hyperlipidemia, rheumatoid arthritis, COPD who was admitted after she was found to have generalized weakness, dizziness  several falls and confusion.  Treated for acute metabolic encephalopathy, mentation appears to be gradually improved.  Patient was noted to not be taking her meds for about 2 days.  Patient was diagnosed with sigmoid diverticulitis and was started on antibiotics.  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  She has not been able to tolerate MRI of the brain.  Neurology recommending aspirin  and Plavix  75 mg for secondary stroke prevention.  Patient was seen by PT and OT, required mod assist to ambulate 10 feet with rolling walker.  SLP eval has been ordered.  Patient appeared candidate for CIR   Assessment / Plan / Recommendation Clinical Impression  Speech and language evaluation completed; Pt presents with moderate/severe expressive aphasia, mild receptive aphasia and Pt also presents with apraxia. Receptive language and cognition is difficult to assess secondary to expressive aphasia.  Expressive language is characterized by semantic paraphasias, phonemic paraphasias, groping for words, jargon and confabulation. Note when asked to repeat a word she is unable to do so - she may say something that sounds similar but every attempt is different. Pt reports she is aware of her errors but speech is fluent in that she continues to speak and attempt despite her words being jargon or a close approximation of the targetted word. Pt named items in the room with ~ 25% accuracy. She answered biographical y/n questions with 75% accuracy. Open ended questions and sentence completion cues were relative effective strategies. Pt seems very stimulable for speech therapy and recommend venue below. Pt is anxious to return home but with support and counseling she affirms understanding that she needs support for safety and rehab before returning home. Our service will continue to follow acutely    SLP Assessment   SLP Recommendation/Assessment: Patient needs continued Speech Language Pathology Services SLP Visit Diagnosis: Aphasia (R47.01);Apraxia (R48.2)     Assistance Recommended at Discharge  Frequent or constant Supervision/Assistance     Frequency and Duration min 2x/week  1 week      SLP Evaluation Cognition  Overall Cognitive Status: Difficult to assess       Comprehension  Auditory Comprehension Overall Auditory Comprehension: Appears within functional limits for tasks assessed Yes/No Questions: Within Functional Limits Commands: Impaired One Step Basic Commands: 75-100% accurate Two Step Basic Commands: 75-100% accurate Multistep Basic Commands: 25-49% accurate Conversation: Simple Interfering Components: Motor planning    Expression Expression Primary Mode of Expression: Verbal Verbal Expression Overall Verbal Expression: Impaired Initiation: Impaired Automatic Speech: Name Level of Generative/Spontaneous Verbalization: Word Repetition: Impaired Level of Impairment: Word level Naming: Impairment Responsive: 0-25% accurate Confrontation: Impaired Convergent: 25-49% accurate Divergent: 0-24% accurate Verbal Errors: Phonemic paraphasias;Semantic paraphasias;Neologisms;Aware of errors;Not aware of errors;Confabulation Effective Techniques: Sentence completion;Open ended questions   Oral / Motor  Oral Motor/Sensory Function Overall Oral Motor/Sensory Function: Within functional limits Motor Speech Overall Motor Speech: Impaired Motor Planning: Impaired Level of Impairment: Word Motor Speech Errors: Groping for words;Aware Effective Techniques: Slow rate;Pause           Shannen Vernon H. Clois MA,  CCC-SLP Speech Language Pathologist  Raguel VEAR Daisy 04/14/2024, 4:10 PM

## 2024-04-14 NOTE — Plan of Care (Signed)
   Problem: Ischemic Stroke/TIA Tissue Perfusion: Goal: Complications of ischemic stroke/TIA will be minimized Outcome: Progressing   Problem: Coping: Goal: Will verbalize positive feelings about self Outcome: Progressing Goal: Will identify appropriate support needs Outcome: Progressing   Problem: Health Behavior/Discharge Planning: Goal: Ability to manage health-related needs will improve Outcome: Progressing Goal: Goals will be collaboratively established with patient/family Outcome: Progressing   Problem: Self-Care: Goal: Ability to participate in self-care as condition permits will improve Outcome: Progressing Goal: Verbalization of feelings and concerns over difficulty with self-care will improve Outcome: Progressing Goal: Ability to communicate needs accurately will improve Outcome: Progressing   Problem: Nutrition: Goal: Risk of aspiration will decrease Outcome: Progressing Goal: Dietary intake will improve Outcome: Progressing

## 2024-04-14 NOTE — Progress Notes (Signed)
 [facility] PROGRESS NOTE  Betty Golden (DOB: July 05, 1946) FMW:[id number] PCP: Irven Ozell DEL, MD  Brief Narrative: Betty Golden is a 78 y.o. female with a history of RA, HTN, COPD who presented to the ED on 04/12/2024 when family reported she was becoming increasingly confused. Formerly independent, she began walking around the house nude, hallucinating, not eating or drinking. She was afebrile, leukocytosis, hypokalemia, head CT showed suspected recent infarct in right parietal white matter. CT abd/pelvis showed mild sigmoid diverticulitis, descending and sigmoid colitis vs. nondistention. Antibiotics given, IVF given, and patient admitted. Did not tolerate MRI. Metabolic derangements have improved, leukocytosis improving. Mental status remains abnormal. Neurology consulted.  -----------------------------------------------------------------------------------------------------------   Subjective: The patient was seen and examined this morning, family ember present at bedside, Less agitated, trying to answer questions to the best that she can Still having difficulty with recalling, speaking using incorrect terms difficulty with word findings Denies have any weakness or numbness moving all extremities No facial asymmetry noted  -----------------------------------------------------------------------------------------------------------   Assessment & Plan:  Acute metabolic encephalopathy -Presumed stroke based on CT findings - Remain confused with some hallucination, aphasic difficulty with word finding and communication - Unable to tolerate MRI, will attempt again with help of Ativan  (Unable to complete MRI previously due to coordination, cooperation of patient)   - Head CT-suggest recent infarct,  - Continue neurochecks - Continue aspirin , Plavix , statins -for stroke prevention    Leukocytosis -likely due to sigmoid diverticulitis -No other signs of infection - CTAP  WC also shows mild sigmoid diverticulitis/colitis.   Currently no pyelonephritis- but UA not suggestive of UTI.  - Improvement in leukocytosis, AKI, and hypokalemia.   Has history of RA, no meningitic signs, no need for LP at this time per neurology.   - Continue with sitter, as ordered, as needed Haldol  - Continue antibiotics  Hypokalemia - Continue to monitor and replete monitoring magnesium  level  Stroke based on above findings - Start aspirin  81mg , Plavix , statins - Check FLP, A1c - Check CTA head/neck. Appreciate neurology recommendations.    Acute sigmoid diverticulitis:  Presenting with abdominal pain, leukocytosis of 18.7 >>> 13.2  - Sepsis was ruled out -  CT [facility] WC- Mild sigmoid colonic diverticulitis. No perforation or abscess. Descending and sigmoid colonic wall thickening versus nondistention, can be seen with colitis. - Will continue  IV ceftriaxone  and metronidazole     RA: Reports generalized body aches.  But no obvious swelling of any joints. Has nodules. She is on leflunomide  and satralizumab injections q14days. - Resume leflunomide    Essential hypertension Stable. -Holding Norvasc , lisinopril/HCTZ allowing for permissive hypertension    COPD: Quiescent Stable.  She is on Fasenra  injections every 8 weeks. - Resume home regimen.    Ethics: Full code-discussed with daughter at bedside  Disposition: -From home likely return home with home health in next 24-48 hours - PT OT consult for further evaluation recommendation   Objective: Blood pressure 137/75, pulse 98, temperature (!) 97.4 F (36.3 C), temperature source Oral, resp. rate 18, height 5' 3 (1.6 m), weight 71 kg, SpO2 98%.      General:  AAO x 1,  cooperative, no distress;   HEENT:  Normocephalic, PERRL, otherwise with in Normal limits   Neuro:  Expressive aphasia CNII-XII intact. , normal motor and sensation, reflexes intact   Lungs:   Clear to auscultation BL, Respirations unlabored,  No  wheezes / crackles  Cardio:    S1/S2, RRR, No murmure, No Rubs or Gallops  Abdomen:  Soft, non-tender, bowel sounds active all four quadrants, no guarding or peritoneal signs.  Muscular  skeletal:  Limited exam -global generalized weaknesses - in bed, able to move all 4 extremities,   2+ pulses,  symmetric, No pitting edema  Skin:  Dry, warm to touch, negative for any Rashes,  Wounds: Please see nursing documentation      aspirin  EC  81 mg Oral Daily   budesonide -glycopyrrolate -formoterol   2 puff Inhalation BID   clopidogrel   75 mg Oral Daily   enoxaparin  (LOVENOX ) injection  40 mg Subcutaneous Q24H   haloperidol  lactate  1 mg Intravenous Once   leflunomide   20 mg Oral Daily   LORazepam   1 mg Intravenous Once   rosuvastatin   20 mg Oral Daily   traZODone   25 mg Oral QHS      Latest Ref Rng & Units 04/14/2024    4:48 AM 04/13/2024    4:52 AM 04/12/2024    2:34 PM  CMP  Glucose 70 - 99 mg/dL 72  80  88   BUN 8 - 23 mg/dL 10  14  20    Creatinine 0.44 - 1.00 mg/dL 8.99  9.04  8.64   Sodium 135 - 145 mmol/L 141  141  140   Potassium 3.5 - 5.1 mmol/L 3.2  3.1  2.6   Chloride 98 - 111 mmol/L 111  111  101   CO2 22 - 32 mmol/L 17  21  21    Calcium  8.9 - 10.3 mg/dL 9.1  8.5  89.8   Total Protein 6.5 - 8.1 g/dL 6.5   7.6   Total Bilirubin 0.0 - 1.2 mg/dL 0.6   1.4   Alkaline Phos 38 - 126 U/L 46   57   AST 15 - 41 U/L 37   30   ALT 0 - 44 U/L 22   24       Latest Ref Rng & Units 04/14/2024    4:48 AM 04/13/2024    4:52 AM 04/12/2024    2:34 PM  CBC  WBC 4.0 - 10.5 K/uL 13.2  12.0  18.7   Hemoglobin 12.0 - 15.0 g/dL 87.6  88.7  87.1   Hematocrit 36.0 - 46.0 % 37.8  34.8  38.1   Platelets 150 - 400 K/uL 202  205  257           SIGNED: Adriana DELENA Grams, MD, FHM. FAAFP Time spent 50 minutes -in evaluating status patient, drawn plan of care   [facility],  Pager (please use [email or web address] to page/text)  Please use Epic Secure Chat for non-urgent communication  (7AM-7PM) If 7PM-7AM, please contact night-coverage [email or web address],  04/14/2024, 3:47 PM

## 2024-04-15 ENCOUNTER — Telehealth: Payer: Self-pay | Admitting: *Deleted

## 2024-04-15 ENCOUNTER — Inpatient Hospital Stay (HOSPITAL_COMMUNITY)

## 2024-04-15 DIAGNOSIS — G9341 Metabolic encephalopathy: Secondary | ICD-10-CM | POA: Diagnosis not present

## 2024-04-15 DIAGNOSIS — G459 Transient cerebral ischemic attack, unspecified: Secondary | ICD-10-CM

## 2024-04-15 LAB — BASIC METABOLIC PANEL WITH GFR
Anion gap: 6 (ref 5–15)
BUN: 7 mg/dL — ABNORMAL LOW (ref 8–23)
CO2: 22 mmol/L (ref 22–32)
Calcium: 8.6 mg/dL — ABNORMAL LOW (ref 8.9–10.3)
Chloride: 114 mmol/L — ABNORMAL HIGH (ref 98–111)
Creatinine, Ser: 0.97 mg/dL (ref 0.44–1.00)
GFR, Estimated: >60 mL/min (ref >60)
Glucose, Bld: 85 mg/dL (ref 70–99)
Potassium: 3.2 mmol/L — ABNORMAL LOW (ref 3.5–5.1)
Sodium: 142 mmol/L (ref 135–145)

## 2024-04-15 LAB — BLOOD GAS, VENOUS
Acid-Base Excess: 7.4 mmol/L — ABNORMAL HIGH (ref 0.0–2.0)
Bicarbonate: 29.1 mmol/L — ABNORMAL HIGH (ref 20.0–28.0)
Drawn by: 66297
O2 Saturation: 31.1 %
Patient temperature: 36.4
pCO2, Ven: 30 mmHg — ABNORMAL LOW (ref 44–60)
pH, Ven: 7.59 — ABNORMAL HIGH (ref 7.25–7.43)
pO2, Ven: <31 mmHg — CL (ref 32–45)

## 2024-04-15 LAB — CBC
HCT: 34.2 % — ABNORMAL LOW (ref 36.0–46.0)
Hemoglobin: 11.2 g/dL — ABNORMAL LOW (ref 12.0–15.0)
MCH: 29 pg (ref 26.0–34.0)
MCHC: 32.7 g/dL (ref 30.0–36.0)
MCV: 88.6 fL (ref 80.0–100.0)
Platelets: 200 K/uL (ref 150–400)
RBC: 3.86 MIL/uL — ABNORMAL LOW (ref 3.87–5.11)
RDW: 14.2 % (ref 11.5–15.5)
WBC: 11.3 K/uL — ABNORMAL HIGH (ref 4.0–10.5)
nRBC: 0 % (ref 0.0–0.2)

## 2024-04-15 MED ORDER — ASPIRIN 81 MG PO TBEC: 81.0000 mg | DELAYED_RELEASE_TABLET | Freq: Every day | ORAL | 12 refills | Status: DC

## 2024-04-15 MED ORDER — METRONIDAZOLE 500 MG PO TABS: 500.0000 mg | ORAL_TABLET | Freq: Three times a day (TID) | ORAL | 0 refills | Status: DC

## 2024-04-15 MED ORDER — CLOPIDOGREL BISULFATE 75 MG PO TABS: 75.0000 mg | ORAL_TABLET | Freq: Every day | ORAL | 0 refills | Status: DC

## 2024-04-15 MED ORDER — POTASSIUM CHLORIDE 20 MEQ PO PACK
40.0000 meq | PACK | Freq: Once | ORAL | Status: AC
  Administered 2024-04-15: 40 meq via ORAL
  Filled 2024-04-15: qty 2

## 2024-04-15 MED ORDER — ASPIRIN 81 MG PO TBEC: 81.0000 mg | DELAYED_RELEASE_TABLET | Freq: Every day | ORAL | 2 refills | Status: DC

## 2024-04-15 MED ORDER — ROSUVASTATIN CALCIUM 20 MG PO TABS: 40.0000 mg | ORAL_TABLET | Freq: Every day | ORAL | 1 refills | Status: DC

## 2024-04-15 MED ORDER — ASPIRIN 81 MG PO TBEC: 81.0000 mg | DELAYED_RELEASE_TABLET | Freq: Every day | ORAL | 0 refills | Status: DC

## 2024-04-15 MED ORDER — METOPROLOL SUCCINATE ER 25 MG PO TB24: 25.0000 mg | ORAL_TABLET | Freq: Every day | ORAL | 0 refills | Status: DC

## 2024-04-15 MED ORDER — CIPROFLOXACIN HCL 250 MG PO TABS
500.0000 mg | ORAL_TABLET | Freq: Two times a day (BID) | ORAL | Status: DC
  Administered 2024-04-15 – 2024-04-17 (×5): 500 mg via ORAL
  Filled 2024-04-15 (×5): qty 2

## 2024-04-15 MED ORDER — CIPROFLOXACIN HCL 500 MG PO TABS: 500.0000 mg | ORAL_TABLET | Freq: Two times a day (BID) | ORAL | 0 refills | Status: DC

## 2024-04-15 MED ORDER — MAGNESIUM SULFATE 2 GM/50ML IV SOLN
2.0000 g | Freq: Once | INTRAVENOUS | Status: AC
  Administered 2024-04-15: 2 g via INTRAVENOUS
  Filled 2024-04-15: qty 50

## 2024-04-15 MED ORDER — METRONIDAZOLE 500 MG PO TABS
500.0000 mg | ORAL_TABLET | Freq: Three times a day (TID) | ORAL | Status: DC
  Administered 2024-04-15 – 2024-04-17 (×6): 500 mg via ORAL
  Filled 2024-04-15 (×6): qty 1

## 2024-04-15 NOTE — Care Plan (Signed)
 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 Reconstitution of the M2 branch on the left within the sylvian fissure, although the vessel appears diminutive in caliber and irregular. Overall diminished number and caliber of MCA branches within the posterior aspect of the left MCA territory compared to the right.  Recommend ASA 81mg  daily and plavix  75mg  daily for 3 months followed by ASA 81mg  daily - Cardiac monitor to look for par A fib - Neuro f/u in 2-3 months - rest per note on 04/13/2024 - Discussed with Dr Harlen  Espyn Radwan O Ylonda Storr

## 2024-04-15 NOTE — Telephone Encounter (Signed)
 Request for 30 day monitor from Dr. Willette for TIA. Pt enrolled in [employer] and order placed.

## 2024-04-15 NOTE — Plan of Care (Signed)
   Problem: Education: Goal: Knowledge of General Education information will improve Description Including pain rating scale, medication(s)/side effects and non-pharmacologic comfort measures Outcome: Progressing   Problem: Health Behavior/Discharge Planning: Goal: Ability to manage health-related needs will improve Outcome: Progressing

## 2024-04-15 NOTE — Discharge Summary (Addendum)
 Physician Discharge Summary   Patient: Betty Golden MRN: [id number] DOB: 02/08/1946  Admit date:     04/12/2024  Discharge date: 04/17/24  Discharge Physician: Adriana DELENA Grams   PCP: Irven Ozell DEL, MD   Discharge summary updated, no further changes.   Recommendations at discharge:    Follow-up with neurology in 1-4 weeks Per neurology recommendation continue Aspirin  + plavix  for 3 months,  followed by aspirin  81 mg           daily -continue statins Recommend continue PT OT, fall precaution, speech therapy Complete current dual antibiotics as recommended Follow-up with gastroenterologist in 4-6 weeks for further evaluation possible outpatient GI workup         (EGD/colonoscopy)  Discharge Diagnoses: Principal Problem:   Acute metabolic encephalopathy Active Problems:   RA (rheumatoid arthritis) (HCC)   Essential hypertension   COPD (chronic obstructive pulmonary disease) (HCC)  Resolved Problems:   * No resolved hospital problems. *  Hospital Course: Betty Golden is a 78 y.o. female with a history of RA, HTN, COPD who presented to the ED on 04/12/2024 when family reported she was becoming increasingly confused. Formerly independent, she began walking around the house nude, hallucinating, not eating or drinking. She was afebrile, leukocytosis, hypokalemia, head CT showed suspected recent infarct in right parietal white matter. CT abd/pelvis showed mild sigmoid diverticulitis, descending and sigmoid colitis vs. nondistention. Antibiotics given, IVF given, and patient admitted. Did not tolerate MRI. Metabolic derangements have improved, leukocytosis improving. Mental status remains abnormal. Neurology consulted.   Acute metabolic encephalopathy -Mentation improved awake alert oriented-frustrated with word finding - Aphasic -Improved confusion, hallucination -  aphasic difficulty with word finding and communication - Patient refused to go for MRI-despite offering  sedative such as Ativan  (Unable to complete MRI previously due to coordination, cooperation of patient)    - Head CT-suggest recent infarct,  - Continue neurochecks-no new focal neurological findings - Continue aspirin , Plavix , statins -for stroke prevention Will continue aspirin  Plavix  for 21 days, then continue with monotherapy Plavix     Acute Stroke -  - With expressive aphasia- Per neurology recommendation: Aspirin  + plavix  for 3 months,  followed by aspirin  81 mg  daily  Continue statins - LDL goal <  70 -CTA revealed :  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  Sigmoid diverticulitis -Improved leukocytosis -No other signs of infection - CTAP WC also shows mild sigmoid diverticulitis/colitis.   Currently no pyelonephritis- but UA not suggestive of UTI. -Changed IV antibiotics of ciprofloxacin /Flagyl  to p.o.   Hypokalemia - Continue to monitor and replete monitoring magnesium  level    -Echo:EJF  70 to 75%. The  left ventricle has hyperdynamic function. Left ventricular endocardial  border not optimally defined to evaluate regional wall motion. Left  ventricular diastolic parameters are  consistent with Grade I diastolic dysfunction (impaired relaxation).  Elevated left atrial pressure.   2. Right ventricular systolic function is normal.    Acute sigmoid diverticulitis:  Presenting with abdominal pain, leukocytosis of 18.7 >>> 13.2 >>> 11.3 - Sepsis was ruled out -  CT AP WC- Mild sigmoid colonic diverticulitis. No perforation or abscess. Descending and sigmoid colonic wall thickening versus nondistention, can be seen with colitis. - Will continue  IV ceftriaxone  and metronidazole --- switch to p.o.     RA: Reports generalized body aches.  But no obvious swelling of any joints. Has nodules. She is on leflunomide  and satralizumab injections  q33days. - Resume leflunomide    Essential hypertension -BP meds were held to allow for  permissive hypertension - Will resume Norvasc  Norvasc , added metoprolol  discontinued lisinopril/HCTZ     COPD: Quiescent Stable.  She is on Fasenra  injections every 8 weeks. - Resume home regimen.     Ethics: Full code  Consultants: Teleneurology Procedures performed: Stroke workup: CT/CTA head and neck, echo, c Disposition: Rehabilitation facility Diet recommendation:  Discharge Diet Orders (From admission, onward)     Start     Ordered   04/15/24 0000  Diet - low sodium heart healthy        04/15/24 1244           Cardiac and Carb modified diet DISCHARGE MEDICATION: Allergies as of 04/17/2024       Reactions   Hydromorphone  Anaphylaxis   Sulfa Antibiotics Rash        Medication List     STOP taking these medications    atorvastatin  10 MG tablet Commonly known as: LIPITOR   CALCIUM  PO   cephALEXin 500 MG capsule Commonly known as: KEFLEX   losartan -hydrochlorothiazide  100-12.5 MG tablet Commonly known as: HYZAAR   MAGNESIUM  PO   potassium chloride  SA 20 MEQ tablet Commonly known as: KLOR-CON  M   pregabalin  50 MG capsule Commonly known as: LYRICA    ZYRTEC PO       TAKE these medications    albuterol  108 (90 Base) MCG/ACT inhaler Commonly known as: ProAir  HFA Inhale 2 puffs into the lungs every 4 (four) hours as needed for wheezing or shortness of breath.   amLODipine  10 MG tablet Commonly known as: NORVASC  Take 10 mg by mouth daily.   aspirin  EC 81 MG tablet Take 1 tablet (81 mg total) by mouth daily. Swallow whole.   CIMZIA  Inject 1 Dose into the skin every 14 (fourteen) days.   ciprofloxacin  500 MG tablet Commonly known as: CIPRO  Take 1 tablet (500 mg total) by mouth 2 (two) times daily for 7 days.   clindamycin 1 % lotion Commonly known as: CLEOCIN T Apply 1 Application topically daily.   clopidogrel  75 MG tablet Commonly known as: PLAVIX  Take 1 tablet (75 mg total) by mouth daily.   colestipol  1 g tablet Commonly  known as: COLESTID  Take 2 g by mouth daily. Take 4 hours before or after other medications.   cyanocobalamin  1000 MCG tablet Commonly known as: VITAMIN B12 Take 1 tablet (1,000 mcg total) by mouth daily.   DULoxetine  30 MG capsule Commonly known as: CYMBALTA  Take 30 mg by mouth 2 (two) times daily.   Fasenra  30 MG/ML prefilled syringe Generic drug: benralizumab  Inject 1 mL (30 mg total) into the skin every 8 (eight) weeks.   fluticasone  50 MCG/ACT nasal spray Commonly known as: FLONASE  SPRAY 2 SPRAYS INTO EACH NOSTRIL EVERY DAY   IRON PO Take 65 mg by mouth daily.   leflunomide  20 MG tablet Commonly known as: ARAVA  Take 20 mg by mouth daily.   metoprolol  succinate 25 MG 24 hr tablet Commonly known as: Toprol  XL Take 1 tablet (25 mg total) by mouth daily.   metroNIDAZOLE  500 MG tablet Commonly known as: FLAGYL  Take 1 tablet (500 mg total) by mouth every 8 (eight) hours for 7 days.   MULTIVITAMIN PO Take 1 tablet by mouth daily at 6 (six) AM.   pantoprazole  40 MG tablet Commonly known as: PROTONIX  Take 1 tablet (40 mg total) by mouth daily.   Restasis  0.05 % ophthalmic emulsion Generic drug:  cycloSPORINE  Place 1 drop into both eyes 2 (two) times daily.   rosuvastatin  20 MG tablet Commonly known as: CRESTOR  Take 2 tablets (40 mg total) by mouth daily.   Trelegy Ellipta  200-62.5-25 MCG/ACT Aepb Generic drug: Fluticasone -Umeclidin-Vilant Inhale 1 puff into the lungs in the morning.        Discharge Exam: Filed Weights   04/12/24 1347  Weight: 71 kg        General:  AAO x 3,  cooperative, no distress;   HEENT:  Normocephalic, PERRL, otherwise with in Normal limits   Neuro:  Speech intact, aphasic, difficult to with word findings CNII-XII intact. , normal motor and sensation, reflexes intact   Lungs:   Clear to auscultation BL, Respirations unlabored,  No wheezes / crackles  Cardio:    S1/S2, RRR, No murmure, No Rubs or Gallops   Abdomen:  Soft,  non-tender, bowel sounds active all four quadrants, no guarding or peritoneal signs.  Muscular  skeletal:  Limited exam -global generalized weaknesses - in bed, able to move all 4 extremities,   2+ pulses,  symmetric, No pitting edema  Skin:  Dry, warm to touch, negative for any Rashes,  Wounds: Please see nursing documentation          Condition at discharge: fair  The results of significant diagnostics from this hospitalization (including imaging, microbiology, ancillary and laboratory) are listed below for reference.   Imaging Studies: ECHOCARDIOGRAM COMPLETE Result Date: 04/14/2024    ECHOCARDIOGRAM REPORT   Patient Name:   KEYLEEN CERRATO Date of Exam: 04/14/2024 Medical Rec #:  [id number]         Height:       63.0 in Accession #:    [id number]        Weight:       156.5 lb Date of Birth:  [birth year]         BSA:          1.742 m Patient Age:    78 years          BP:           153/94 mmHg Patient Gender: F                 HR:           108 bpm. Exam Location:  [facility] Procedure: 2D Echo, 3D Echo, Cardiac Doppler and Color Doppler (Both Spectral            and Color Flow Doppler were utilized during procedure). Indications:    Stroke I63.9  History:        Patient has prior history of Echocardiogram examinations, most                 recent 04/17/2019. Stroke. H/o Hypertension, Hyperlipidemia,                 Coronary artery disease.  Sonographer:    BERNARDA ROCKS Referring Phys: [id number] RYAN B GRUNZ IMPRESSIONS  1. Left ventricular ejection fraction, by estimation, is 70 to 75%. The left ventricle has hyperdynamic function. Left ventricular endocardial border not optimally defined to evaluate regional wall motion. Left ventricular diastolic parameters are consistent with Grade I diastolic dysfunction (impaired relaxation). Elevated left atrial pressure.  2. Right ventricular systolic function is normal. The right ventricular size is normal. Tricuspid regurgitation signal is inadequate for  assessing PA pressure.  3. The mitral valve is normal in structure. No evidence of mitral valve regurgitation. No evidence  of mitral stenosis.  4. The aortic valve was not well visualized. Aortic valve regurgitation is not visualized. No aortic stenosis is present. Aortic valve area, by VTI measures 1.48 cm. Aortic valve mean gradient measures 7.0 mmHg. Aortic valve Vmax measures 1.88 m/s.  5. The inferior vena cava is normal in size with greater than 50% respiratory variability, suggesting right atrial pressure of 3 mmHg. Conclusion(s)/Recommendation(s): No intracardiac source of embolism detected on this transthoracic study. Consider a transesophageal echocardiogram to exclude cardiac source of embolism if clinically indicated. FINDINGS  Left Ventricle: Left ventricular ejection fraction, by estimation, is 70 to 75%. The left ventricle has hyperdynamic function. Left ventricular endocardial border not optimally defined to evaluate regional wall motion. The left ventricular internal cavity size was normal in size. There is no left ventricular hypertrophy. Left ventricular diastolic parameters are consistent with Grade I diastolic dysfunction (impaired relaxation). Elevated left atrial pressure. Right Ventricle: The right ventricular size is normal. No increase in right ventricular wall thickness. Right ventricular systolic function is normal. Tricuspid regurgitation signal is inadequate for assessing PA pressure. Left Atrium: Left atrial size was normal in size. Right Atrium: Right atrial size was normal in size. Pericardium: There is no evidence of pericardial effusion. Mitral Valve: The mitral valve is normal in structure. Mild to moderate mitral annular calcification. No evidence of mitral valve regurgitation. No evidence of mitral valve stenosis. MV peak gradient, 16.0 mmHg. The mean mitral valve gradient is 6.0 mmHg. Tricuspid Valve: The tricuspid valve is normal in structure. Tricuspid valve regurgitation is  not demonstrated. No evidence of tricuspid stenosis. Aortic Valve: The aortic valve was not well visualized. Aortic valve regurgitation is not visualized. No aortic stenosis is present. Aortic valve mean gradient measures 7.0 mmHg. Aortic valve peak gradient measures 14.1 mmHg. Aortic valve area, by VTI measures 1.48 cm. Pulmonic Valve: The pulmonic valve was not well visualized. Pulmonic valve regurgitation is not visualized. No evidence of pulmonic stenosis. Aorta: The aortic root is normal in size and structure. Venous: The inferior vena cava is normal in size with greater than 50% respiratory variability, suggesting right atrial pressure of 3 mmHg. IAS/Shunts: No atrial level shunt detected by color flow Doppler.  LEFT VENTRICLE PLAX 2D LVIDd:         4.50 cm     Diastology LVIDs:         2.70 cm     LV e' medial:    8.02 cm/s LV PW:         0.90 cm     LV E/e' medial:  15.0 LV IVS:        0.90 cm     LV e' lateral:   8.58 cm/s LVOT diam:     1.40 cm     LV E/e' lateral: 14.0 LV SV:         41 LV SV Index:   24 LVOT Area:     1.54 cm  LV Volumes (MOD) LV vol d, MOD A2C: 69.4 ml LV vol d, MOD A4C: 66.9 ml LV vol s, MOD A2C: 20.5 ml LV vol s, MOD A4C: 19.6 ml LV SV MOD A2C:     48.9 ml LV SV MOD A4C:     66.9 ml LV SV MOD BP:      48.5 ml RIGHT VENTRICLE             IVC RV Basal diam:  2.40 cm     IVC diam: 1.30 cm RV S prime:  18.30 cm/s TAPSE (M-mode): 2.6 cm LEFT ATRIUM             Index        RIGHT ATRIUM          Index LA diam:        3.80 cm 2.18 cm/m   RA Area:     7.42 cm LA Vol (A2C):   36.0 ml 20.66 ml/m  RA Volume:   10.40 ml 5.97 ml/m LA Vol (A4C):   46.8 ml 26.86 ml/m LA Biplane Vol: 43.3 ml 24.85 ml/m  AORTIC VALVE                     PULMONIC VALVE AV Area (Vmax):    1.11 cm      PV Vmax:          1.77 m/s AV Area (Vmean):   1.24 cm      PV Peak grad:     12.5 mmHg AV Area (VTI):     1.48 cm      PR End Diast Vel: 2.94 msec AV Vmax:           188.00 cm/s AV Vmean:          120.000  cm/s AV VTI:            0.278 m AV Peak Grad:      14.1 mmHg AV Mean Grad:      7.0 mmHg LVOT Vmax:         135.00 cm/s LVOT Vmean:        96.900 cm/s LVOT VTI:          0.267 m LVOT/AV VTI ratio: 0.96  AORTA Ao Root diam: 2.20 cm Ao Asc diam:  2.50 cm MITRAL VALVE MV Area (PHT): 6.90 cm     SHUNTS MV Area VTI:   1.54 cm     Systemic VTI:  0.27 m MV Peak grad:  16.0 mmHg    Systemic Diam: 1.40 cm MV Mean grad:  6.0 mmHg MV Vmax:       2.00 m/s MV Vmean:      110.0 cm/s MV Decel Time: 110 msec MV E velocity: 120.00 cm/s MV A velocity: 197.00 cm/s MV E/A ratio:  0.61 Wilbert Bihari MD Electronically signed by Wilbert Bihari MD Signature Date/Time: 04/14/2024/4:43:05 PM    Final    CT ANGIO HEAD NECK W WO CM Addendum Date: 04/13/2024 ADDENDUM #1ADDENDUM: Findings discussed with Dr. Bryn at 6:56PM on 04/13/24. ---------------------------------------------------- Electronically signed by: Donnice Mania MD 04/13/2024 07:00 PM EDT RP Workstation: [id number]   Result Date: 04/13/2024 **ORIGINAL REPORT   EXAM: CT HEAD WITHOUT CTA HEAD AND NECK WITH AND WITHOUT 04/13/2024 05:51:02 PM TECHNIQUE: CTA of the head and neck was performed with and without the administration of intravenous contrast. Noncontrast CT of the head with reconstructed 2-D images are also provided for review. Multiplanar 2D and/or 3D reformatted images are provided for review. Automated exposure control, iterative reconstruction, and/or weight based adjustment of the mA/kV was utilized to reduce the radiation dose to as low as reasonably achievable. COMPARISON: MRI head 08/25 and CT head 04/12/24 CLINICAL HISTORY: Stroke, follow up. FINDINGS: CT HEAD: BRAIN AND VENTRICLES: No acute intracranial hemorrhage. Nonspecific hypoattenuation in the periventricular and subcortical white matter, most likely representing chronic microvascular ischemic changes. Similar appearance of possible recent infarct in the right parietal white matter. Postsurgical changes of  the paranasal sinuses. ORBITS: Bilateral lens replacement. SINUSES AND MASTOIDS:  Postsurgical changes of the paranasal sinuses. Mucosal thickening in the left maxillary sinus. CTA NECK: AORTIC ARCH AND ARCH VESSELS: Mild atherosclerosis of the visualized aortic arch. CERVICAL CAROTID ARTERIES: The right carotid artery is patent from the origin to the skull base. Mild atherosclerosis at the right carotid bifurcation without hemodynamically significant stenosis. The left carotid artery is patent from the origin to the skull base. Mild atherosclerosis at the left carotid bifurcation without hemodynamically significant stenosis. CERVICAL VERTEBRAL ARTERIES: The vertebral arteries are patent from the origins to the vertebrobasilar confluence. Atherosclerosis of the left vertebral artery at C2 resulting in mild stenosis. LUNGS AND MEDIASTINUM: Opacities in the peripheral right upper lobe, which may reflect scarring. SOFT TISSUES: No acute abnormality. BONES: Changes in the visualized spine with prominent endplate osteophytes at multiple levels. Edentulous maxilla and mandible. CTA HEAD: ANTERIOR CIRCULATION: The intracranial internal carotid arteries are patent bilaterally. There is mild atherosclerosis of the carotid siphons. Mild stenosis of the right cavernous ICA. The right MCA is patent. 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 There is reconstitution of the M2 branch on the left within the sylvian fissure, although the vessel appears diminutive in caliber and irregular. Overall diminished number and caliber of MCA branches within the posterior aspect of the left MCA territory compared to the right. The anterior cerebral arteries are patent bilaterally. POSTERIOR CIRCULATION: No significant stenosis of the posterior cerebral arteries. No significant stenosis of the basilar artery. No significant stenosis of the vertebral arteries. OTHER: No dural venous sinus  thrombosis on this non-dedicated study.   IMPRESSION: 1. 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 2. Reconstitution of the M2 branch on the left within the sylvian fissure, although the vessel appears diminutive in caliber and irregular. Overall diminished number and caliber of MCA branches within the posterior aspect of the left MCA territory compared to the right. 3. No acute intracranial hemorrhage. 4. Mild atherosclerosis of the visualized aortic arch, carotid bifurcations, and carotid siphons without hemodynamically significant stenosis. Mild stenosis of the right cavernous ICA. Electronically signed by: Donnice Mania MD 04/13/2024 06:17 PM EDT RP Workstation: [id number]   CT ABDOMEN PELVIS W CONTRAST Result Date: 04/12/2024 CLINICAL DATA:  Acute abdominal pain.  Dizziness and multiple falls. EXAM: CT ABDOMEN AND PELVIS WITH CONTRAST TECHNIQUE: Multidetector CT imaging of the abdomen and pelvis was performed using the standard protocol following bolus administration of intravenous contrast. RADIATION DOSE REDUCTION: This exam was performed according to the departmental dose-optimization program which includes automated exposure control, adjustment of the mA and/or kV according to patient size and/or use of iterative reconstruction technique. CONTRAST:  80mL OMNIPAQUE  IOHEXOL  300 MG/ML  SOLN COMPARISON:  CT 09/25/2021 FINDINGS: Lower chest: Chronic cystic changes in the lung bases. No acute airspace disease. Hepatobiliary: Post cholecystectomy. Prominence of the central intrahepatic and extrahepatic common bile duct likely related to prior cholecystectomy. The distal CBD measures 9 mm, normal for postcholecystectomy state. No focal liver abnormality. Pancreas: Atrophic.  No ductal dilatation or inflammation. Spleen: Normal in size without focal abnormality. Adrenals/Urinary Tract: No adrenal nodule. No hydronephrosis. Simple appearing cyst in the mid right  kidney. No further follow-up imaging is recommended. Lobulated right renal contours suggestive of scarring. Heterogeneous enhancement of the upper pole of the right kidney. No perinephric stranding. No renal calculi. Decompressed ureters. Partially distended urinary bladder, normal for degree of distension. Stomach/Bowel: Multiple  sigmoid colonic diverticula with mild inflammation about the proximal sigmoid, suggestive of mild diverticulitis. No perforation or abscess. Descending and sigmoid colonic wall thickening versus nondistention. Appendectomy per history. There is no small bowel inflammation or obstruction. Stomach is partially distended without wall thickening. Vascular/Lymphatic: Aorto bi-iliac atherosclerosis. No aortic aneurysm. The portal vein is patent. No enlarged lymph nodes in the abdomen or pelvis. Reproductive: Uterus and bilateral adnexa are unremarkable. Other: No free air. No abdominopelvic collection. No free fluid. Small fat containing umbilical hernia. Musculoskeletal: The bones are subjectively under mineralized. Degenerative change in the spine and hips. There are no acute or suspicious osseous abnormalities. IMPRESSION: 1. Mild sigmoid colonic diverticulitis. No perforation or abscess. 2. Descending and sigmoid colonic wall thickening versus nondistention, can be seen with colitis. 3. Heterogeneous enhancement of the upper pole of the right kidney. This may be related to scarring or pyelonephritis recommend correlation with urinalysis. Aortic Atherosclerosis (ICD10-I70.0). Electronically Signed   By: Andrea Gasman M.D.   On: 04/12/2024 17:05   CT Head Wo Contrast Result Date: 04/12/2024 CLINICAL DATA:  Mental status change, unknown cause. Hallucinations. Weakness and dizziness with multiple recent falls. EXAM: CT HEAD WITHOUT CONTRAST TECHNIQUE: Contiguous axial images were obtained from the base of the skull through the vertex without intravenous contrast. RADIATION DOSE REDUCTION:  This exam was performed according to the departmental dose-optimization program which includes automated exposure control, adjustment of the mA and/or kV according to patient size and/or use of iterative reconstruction technique. COMPARISON:  Head MRI 02/27/2024 and CT 09/25/2021 FINDINGS: Brain: There is an approximately 1.5 cm focus of hypodensity in the right parietal white matter near the posterior aspect of the right corona radiata which appears new or progressive from last month's MRI and is suspicious for a recent infarct. 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 No intracranial hemorrhage, mass, midline shift, hydrocephalus, or extra-axial fluid collection is identified. Vascular: Calcified atherosclerosis at the skull base. No hyperdense vessel. Skull: No fracture or suspicious lesion. Sinuses/Orbits: Prior functional endoscopic sinus surgery. Clear mastoid air cells. Bilateral cataract extraction. Other: None. IMPRESSION: 1. Suspected recent infarct in the right parietal white matter. 2. No intracranial hemorrhage. 3. Chronic small vessel ischemia. Electronically Signed   By: Dasie Hamburg M.D.   On: 04/12/2024 16:48   DG Chest Portable 1 View Result Date: 04/12/2024 CLINICAL DATA:  Weakness. EXAM: PORTABLE CHEST 1 VIEW COMPARISON:  01/21/2023 FINDINGS: Both lungs are clear. Heart size is normal. Trachea is midline. Negative for a pneumothorax. Degenerative changes at both shoulders and AC joints. Bridging osteophytes along the right side of the thoracic spine. IMPRESSION: No active disease. Electronically Signed   By: Juliene Balder M.D.   On: 04/12/2024 14:22    Microbiology: Results for orders placed or performed in visit on 01/18/22  Microscopic Examination     Status: Abnormal   Collection Time: 01/18/22  1:11 PM   Urine  Result Value Ref Range Status   WBC, UA 6-10 (A) 0 - 5 /hpf  Final   RBC, Urine None seen 0 - 2 /hpf Final   Epithelial Cells (non renal) 0-10 0 - 10 /hpf Final   Renal Epithel, UA None seen None seen /hpf Final   Bacteria, UA Few (A) None seen/Few Final    Labs: CBC: Recent Labs  Lab 04/12/24 1434 04/13/24 0452 04/14/24 0448 04/15/24 9490 04/16/24 0355 04/17/24 9473  WBC 18.7* 12.0* 13.2* 11.3* 14.5* 12.0*  NEUTROABS 12.9*  --   --   --   --   --   HGB 12.8 11.2* 12.3 11.2* 12.3 13.0  HCT 38.1 34.8* 37.8 34.2* 39.0 40.1  MCV 85.0 89.9 90.0 88.6 90.1 89.7  PLT 257 205 202 200 223 219   Basic Metabolic Panel: Recent Labs  Lab 04/12/24 1434 04/13/24 0452 04/14/24 0448 04/15/24 0509 04/16/24 0355 04/17/24 0526  NA 140 141 141 142 139 141  K 2.6* 3.1* 3.2* 3.2* 3.6 3.6  CL 101 111 111 114* 110 111  CO2 21* 21* 17* 22 21* 19*  GLUCOSE 88 80 72 85 101* 108*  BUN 20 14 10  7* 11 10  CREATININE 1.35* 0.95 1.00 0.97 1.22* 1.07*  CALCIUM  10.1 8.5* 9.1 8.6* 9.6 9.4  MG 1.8  --  2.0  --   --   --    Liver Function Tests: Recent Labs  Lab 04/12/24 1434 04/14/24 0448  AST 30 37  ALT 24 22  ALKPHOS 57 46  BILITOT 1.4* 0.6  PROT 7.6 6.5  ALBUMIN 4.1 3.3*   CBG: No results for input(s): GLUCAP in the last 168 hours.  Discharge time spent: greater than 30 minutes.  Signed: Adriana DELENA Grams, MD [facility] 04/17/2024

## 2024-04-15 NOTE — Care Management Important Message (Signed)
 Important Message  Patient Details  Name: Betty Golden MRN: [id number] Date of Birth: 04-Jul-1946   Important Message Given:  N/A - LOS <3 / Initial given by admissions     [name] 04/15/2024, 8:53 AM

## 2024-04-15 NOTE — Progress Notes (Signed)
 Inpatient Rehab Admissions Coordinator:  Spoke with pt's daughter [name]. Reviewed benefits. Informed her that we are awaiting insurance authorization. Will continue to follow.   Tinnie Yvone Cohens, MS, CCC-SLP Admissions Coordinator [phone]

## 2024-04-15 NOTE — H&P (Signed)
 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 She was found to have leucocytosis WBC 18.7, hypokalemia K-2.6 and was moaning with abdominal tenderness at admission. CT head  showed suspicion of recent right parietal infarct. CT A/P showed mild colonic diverticulitis with descending and sigmoid wall thickening question colitis. She was started on IV antibiotics and treated with fluid bolus. MRI attempted but patient had panic attack so aborted. 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   Dr. Voncile consulted and recommended DAPT for left parietal stroke due to occluded L-MCA distribution. MRI recommended for work up but patient declined repeat attempt. She was noted to have mild left sided weakness with aphasia and therapy consulted.  Neurology recommending ASA 81 mg and Plavix  75 mg daily for 3 months followed by aspirin  alone.  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 She was independent PTA and CIR recommended due to functional decline.     Review of Systems  Constitutional:  Negative for chills and fever.  HENT:  Negative for hearing loss.   Eyes:  Negative for blurred vision and double vision.  Respiratory:  Negative for cough and shortness of breath.    Cardiovascular:  Negative for chest pain.  Gastrointestinal:  Negative for abdominal pain, constipation, diarrhea, nausea and vomiting.  Genitourinary: Negative.   Musculoskeletal:  Negative for back pain, joint pain and neck pain.  Neurological:  Positive for sensory change, speech change and weakness. Negative for dizziness and headaches.  Psychiatric/Behavioral:  Negative for suicidal ideas. The patient has insomnia.      Past Medical History:  Diagnosis Date   Asthma    C. difficile colitis    Cervical stenosis of spine    Chronic headaches    Chronic neck pain    Coronary artery disease    Depression    Diverticulosis    Essential hypertension    Fibromyalgia    GERD (gastroesophageal reflux disease)    History of Salmonella gastroenteritis    HNP (herniated nucleus pulposus), lumbar    Hyperlipidemia    IBS (irritable bowel syndrome)    Lung nodule    rheumatoid arthritis     Past Surgical History:  Procedure Laterality Date   APPENDECTOMY  1965   BIOPSY  08/26/2018   Procedure: BIOPSY;  Surgeon: Golda Claudis PENNER, MD;  [facility]: [facility];  Service: Endoscopy;;  gastric   BREAST BIOPSY     BREAST REDUCTION SURGERY  1994   CATARACT EXTRACTION W/PHACO Left 06/09/2023   Procedure: CATARACT EXTRACTION PHACO AND INTRAOCULAR LENS PLACEMENT (IOC);  Surgeon: Harrie Agent, MD;  Location: [facility];  Service: Ophthalmology;  Laterality: Left;  CDE 7.03   CATARACT EXTRACTION W/PHACO Right 06/23/2023   Procedure: CATARACT EXTRACTION PHACO AND INTRAOCULAR LENS PLACEMENT (IOC);  Surgeon:  Harrie Agent, MD;  Location: [facility];  Service: Ophthalmology;  Laterality: Right;  CDE: 11.55   CHOLECYSTECTOMY  1975   COLONOSCOPY N/A 08/19/2013   Procedure: COLONOSCOPY;  Surgeon: Claudis RAYMOND Rivet, MD;  Location: [facility];  Service: Endoscopy;  Laterality: N/A;  155-moved to 140 Ann to notify pt   COLONOSCOPY N/A 08/26/2018   Procedure: COLONOSCOPY;  Surgeon: Rivet Claudis RAYMOND, MD;   [facility]: [facility];  Service: Endoscopy;  Laterality: N/A;   ESOPHAGOGASTRODUODENOSCOPY N/A 08/26/2018   Procedure: ESOPHAGOGASTRODUODENOSCOPY (EGD);  Surgeon: Rivet Claudis RAYMOND, MD;  Location: [facility];  Service: Endoscopy;  Laterality: N/A;   fibromyalgia     LUMBAR LAMINECTOMY/DECOMPRESSION MICRODISCECTOMY Bilateral 09/20/2015   Procedure: MICRO LUMBAR DECOMPRESSION L5-S1 BILATERAL    (1 LEVEL);  Surgeon: Reyes Billing, MD;  Location: [facility];  Service: Orthopedics;  Laterality: Bilateral;   rheumatoid arthritis     Sinus Surgery     TONSILLECTOMY  1968    Family History  Problem Relation Age of Onset   Heart disease Mother    Hypertension Mother    Asthma Mother    Heart disease Father    Stroke Father    Hypertension Father    Stroke Sister    Asthma Sister    Hypertension Brother    Asthma Brother    Asthma Maternal Grandmother    Heart disease Maternal Grandmother    Diabetes Maternal Grandfather    Asthma Maternal Aunt    Colon cancer Neg Hx     Social History:  reports that she quit smoking about 35 years ago. Her smoking use included cigarettes. She started smoking about 45 years ago. She has a 20 pack-year smoking history. She has never used smokeless tobacco. She reports that she does not drink alcohol and does not use drugs.    Allergies  Allergen Reactions   Hydromorphone  Anaphylaxis   Sulfa Antibiotics Rash    Facility-Administered Medications Prior to Admission  Medication Dose Route Frequency Provider Last Rate Last Admin   Benralizumab  SOSY 30 mg  30 mg Subcutaneous Q8 Weeks Gallagher, Joel Louis, MD   30 mg at 02/09/24 9082   Medications Prior to Admission  Medication Sig Dispense Refill   albuterol  (PROAIR  HFA) 108 (90 Base) MCG/ACT inhaler Inhale 2 puffs into the lungs every 4 (four) hours as needed for wheezing or shortness of breath. 18 g 3   amLODipine  (NORVASC ) 10 MG tablet Take 10 mg by mouth daily.     atorvastatin  (LIPITOR) 10 MG tablet  Take 10 mg by mouth daily.     benralizumab  (FASENRA ) 30 MG/ML prefilled syringe Inject 1 mL (30 mg total) into the skin every 8 (eight) weeks. 1 mL 6   CALCIUM  PO Take 1,250 mg by mouth.     [EXPIRED] cephALEXin (KEFLEX) 500 MG capsule Take 1,000 mg by mouth 2 (two) times daily.     Certolizumab Pegol (CIMZIA [place]) Inject 1 Dose into the skin every 14 (fourteen) days.     Cetirizine HCl (ZYRTEC PO) Take 10 mg by mouth daily at 6 (six) AM.     clindamycin (CLEOCIN T) 1 % lotion Apply 1 Application topically daily.     colestipol  (COLESTID ) 1 g tablet Take 2 g by mouth daily. Take 4 hours before or after other medications.     DULoxetine  (CYMBALTA ) 30 MG capsule Take 30 mg by mouth 2 (two) times daily.     Ferrous Sulfate (IRON PO) Take 65 mg by  mouth daily.     fluticasone  (FLONASE ) 50 MCG/ACT nasal spray SPRAY 2 SPRAYS INTO EACH NOSTRIL EVERY DAY 48 mL 1   Fluticasone -Umeclidin-Vilant (TRELEGY ELLIPTA ) 200-62.5-25 MCG/ACT AEPB Inhale 1 puff into the lungs in the morning. 90 each 3   leflunomide  (ARAVA ) 20 MG tablet Take 20 mg by mouth daily.     losartan -hydrochlorothiazide  (HYZAAR) 100-12.5 MG tablet Take 1 tablet by mouth daily.     MAGNESIUM  PO Take 400 mg by mouth daily at 6 (six) AM.     Multiple Vitamin (MULTIVITAMIN PO) Take 1 tablet by mouth daily at 6 (six) AM.     pantoprazole  (PROTONIX ) 40 MG tablet Take 1 tablet (40 mg total) by mouth daily. 30 tablet 0   potassium chloride  SA (KLOR-CON  M) 20 MEQ tablet Take 1 tablet (20 mEq total) by mouth daily. 6 tablet 0   pregabalin  (LYRICA ) 50 MG capsule Take 50 mg by mouth 2 (two) times daily.     RESTASIS  0.05 % ophthalmic emulsion Place 1 drop into both eyes 2 (two) times daily.     vitamin B-12 (CYANOCOBALAMIN ) 1000 MCG tablet Take 1 tablet (1,000 mcg total) by mouth daily. 30 tablet 3      Home: Home Living Family/patient expects to be discharged to:: Private residence Living Arrangements: Children Available Help at Discharge:  Family, Available 24 hours/day Type of Home: House Home Access: Ramped entrance Home Layout: Two level, Able to live on main level with bedroom/bathroom, Full bath on main level Bathroom Shower/Tub: Health visitor: Standard Bathroom Accessibility: Yes Home Equipment: Agricultural consultant (2 wheels), Wheelchair - manual, [employer] - single point, BSC/3in1 Additional Comments: per chart; pt is a poor historian  Lives With: Family   Functional History: Prior Function Prior Level of Function : Patient poor historian/Family not available, Needs assist Physical Assist : ADLs (physical) Mobility (physical): Bed mobility, Transfers, Gait Mobility Comments: household ambulator with no AD, community ambulator with SPC (per chart) ADLs Comments: assited by family (per chart; pt reported assist but also independence; unsure of true baseline ADL function.)  Functional Status:  Mobility: Bed Mobility Overal bed mobility: Needs Assistance Bed Mobility: Supine to Sit, Sit to Supine Supine to sit: Supervision Sit to supine: Used rails, Min assist General bed mobility comments: Up in recliner on entry Transfers Overall transfer level: Needs assistance Equipment used: Rolling walker (2 wheels) Transfers: Sit to/from Stand, Bed to chair/wheelchair/BSC Sit to Stand: Mod assist Bed to/from chair/wheelchair/BSC transfer type:: Step pivot Step pivot transfers: Mod assist, Min assist General transfer comment: increased effort and time, improving to min with prolonged standing Ambulation/Gait Ambulation/Gait assistance: Mod assist Gait Distance (Feet): 10 Feet Assistive device: Rolling walker (2 wheels) Gait Pattern/deviations: Step-through pattern, Decreased stride length, Knees buckling General Gait Details: Pt experienced difficulty maintaining knee extension in stance phase on the LLE Gait velocity: decreased    ADL: ADL Overall ADL's : Needs assistance/impaired Eating/Feeding: Set up,  Sitting Eating/Feeding Details (indicate cue type and reason): finishing snack and drink at beginning of session Grooming: Set up Grooming Details (indicate cue type and reason): Provided washcloth, able to wash face and hands with no assist. Upper Body Bathing: Contact guard assist, Sitting, Supervision/ safety Lower Body Bathing: Supervison/ safety, Contact guard assist, Sitting/lateral leans Upper Body Dressing : Supervision/safety, Contact guard assist, Sitting Lower Body Dressing: Supervision/safety, Contact guard assist, Sitting/lateral leans Lower Body Dressing Details (indicate cue type and reason): Able to doff and don L sock seated in the chair. Toilet Transfer:  Moderate assistance, Minimal assistance, Ambulation, Rolling walker (2 wheels) Toilet Transfer Details (indicate cue type and reason): increased time and effort, initially mod A, improving to min A with slight R sided lean Toileting- Clothing Manipulation and Hygiene: Contact guard assist, Minimal assistance, Sitting/lateral lean Toileting - Clothing Manipulation Details (indicate cue type and reason): Needs assist with clothing management Tub/ Shower Transfer: Moderate assistance, Ambulation, Rolling walker (2 wheels) Functional mobility during ADLs: Moderate assistance, Rolling walker (2 wheels) General ADL Comments: Pt appears to be improving with mobility and cognition.  Cognition: Cognition Overall Cognitive Status: Difficult to assess Orientation Level: Oriented to person, Oriented to place Cognition Arousal: Alert Behavior During Therapy: [facility] for tasks assessed/performed Overall Cognitive Status: Difficult to assess   Blood pressure (!) 148/79, pulse (!) 104, temperature 98.7 F (37.1 C), temperature source Oral, resp. rate 18, height 5' 3 (1.6 m), weight 71 kg, SpO2 97%. Physical Exam  General: No apparent distress HEENT: Head is normocephalic, atraumatic, sclera anicteric, oral mucosa pink and moist Heart:  Reg rate and rhythm. No murmurs rubs or gallops Chest: CTA bilaterally without wheezes, rales, or rhonchi; no distress Abdomen: Soft, non-tender, non-distended, bowel sounds positive. Extremities: 1+ LE edema Psych: Pt is cooperative, frustrated with speech deficits but overall very pleasant Skin: Clean and intact without signs of breakdown on visible portion , bruising on her arms Neuro:    Mental Status: AAOx to day, for month before October, for year able to get with choices, unable to say her name   Speech/Languate: Aphasia more expressive than receptive, able to name 2/3 items, cannot repeat, paraphasias, able to follow intermittently simple commands, occasionally requiring some cueing CRANIAL NERVES: II: PERRL.  III, IV, VI: EOM -appears to be intact although required several attempts due to communication to get her to follow V: normal sensation bilaterally VII: slight R facial weakness, mild right eye ptosis VIII: normal hearing to speech IX, X: normal palatal elevation XI: Head turn appears symmetrical XII: Tongue midline   MOTOR: RUE: 4/5 Deltoid, 4/5 Biceps, 4/5 Triceps,4/5 Grip LUE: 4/5 Deltoid, 4/5 Biceps, 4/5 Triceps, 4/5 Grip RLE: HF 4/5, KE 4/5, ADF 4/5, APF 5/5 LLE: HF 4-/5, KE 4-/5, ADF 4/5, APF 5/5   REFLEXES: No ankle clonus  SENSORY: Normal to touch all 4 extremities  Coordination: Normal finger to nose bilaterally    Results for orders placed or performed during the hospital encounter of 04/12/24 (from the past 48 hours)  CBC     Status: Abnormal   Collection Time: 04/16/24  3:55 AM  Result Value Ref Range   WBC 14.5 (H) 4.0 - 10.5 K/uL   RBC 4.33 3.87 - 5.11 MIL/uL   Hemoglobin 12.3 12.0 - 15.0 g/dL   HCT 60.9 63.9 - 53.9 %   MCV 90.1 80.0 - 100.0 fL   MCH 28.4 26.0 - 34.0 pg   MCHC 31.5 30.0 - 36.0 g/dL   RDW 85.8 88.4 - 84.4 %   Platelets 223 150 - 400 K/uL   nRBC 0.0 0.0 - 0.2 %    Comment: Performed at [facility], [address],  [place], [place] [address]  Basic metabolic panel with GFR     Status: Abnormal   Collection Time: 04/16/24  3:55 AM  Result Value Ref Range   Sodium 139 135 - 145 mmol/L   Potassium 3.6 3.5 - 5.1 mmol/L   Chloride 110 98 - 111 mmol/L   CO2 21 (L) 22 - 32 mmol/L   Glucose, Bld  101 (H) 70 - 99 mg/dL    Comment: Glucose reference range applies only to samples taken after fasting for at least 8 hours.   BUN 11 8 - 23 mg/dL   Creatinine, Ser 8.77 (H) 0.44 - 1.00 mg/dL   Calcium  9.6 8.9 - 10.3 mg/dL   GFR, Estimated 46 (L) >60 mL/min    Comment: (NOTE) Calculated using the CKD-EPI Creatinine Equation (2021)    Anion gap 8 5 - 15    Comment: Performed at [facility], [address], [place], [place] [address]  CBC     Status: Abnormal   Collection Time: 04/17/24  5:26 AM  Result Value Ref Range   WBC 12.0 (H) 4.0 - 10.5 K/uL   RBC 4.47 3.87 - 5.11 MIL/uL   Hemoglobin 13.0 12.0 - 15.0 g/dL   HCT 59.8 63.9 - 53.9 %   MCV 89.7 80.0 - 100.0 fL   MCH 29.1 26.0 - 34.0 pg   MCHC 32.4 30.0 - 36.0 g/dL   RDW 85.8 88.4 - 84.4 %   Platelets 219 150 - 400 K/uL   nRBC 0.0 0.0 - 0.2 %    Comment: Performed at [facility], [address] [place], [place] [address]  Basic metabolic panel with GFR     Status: Abnormal   Collection Time: 04/17/24  5:26 AM  Result Value Ref Range   Sodium 141 135 - 145 mmol/L   Potassium 3.6 3.5 - 5.1 mmol/L   Chloride 111 98 - 111 mmol/L   CO2 19 (L) 22 - 32 mmol/L   Glucose, Bld 108 (H) 70 - 99 mg/dL    Comment: Glucose reference range applies only to samples taken after fasting for at least 8 hours.   BUN 10 8 - 23 mg/dL   Creatinine, Ser 8.92 (H) 0.44 - 1.00 mg/dL   Calcium  9.4 8.9 - 10.3 mg/dL   GFR, Estimated 54 (L) >60 mL/min    Comment: (NOTE) Calculated using the CKD-EPI Creatinine Equation (2021)    Anion gap 11 5 - 15    Comment: Performed at [facility], [address] [place], [place] [address]   No results found.     Blood pressure (!)  148/79, pulse (!) 104, temperature 98.7 F (37.1 C), temperature source Oral, resp. rate 18, height 5' 3 (1.6 m), weight 71 kg, SpO2 97%.  Medical Problem List and Plan: 1. 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  Patient did not tolerate MRI  -patient may shower  -ELOS/Goals: 10-12 days, PT/OT/SLP supervision  -Admit to CIR 2.  Antithrombotics: -DVT/anticoagulation:  Pharmaceutical: Lovenox   -antiplatelet therapy: DAPT X 3 months followed by ASA alone.  Discharge summary and decubitus.  Discharge summary indicated aspirin  and Plavix  for 21 days in 1 location, will message hospitalist to confirm 3 months 3. Pain Management:  Tylenol  prn.  4. Mood/Behavior/Sleep: LCSW to follow for evaluation and support  --trazodone  25 mg at bedtime. Ativan  prn for anxiety/agitation due to aphasia?   -antipsychotic agents: has had haldol  prn--last used on 09/18. 5. Neuropsych/cognition: This patient may be capable of making decisions on her own behalf. 6. Skin/Wound Care: Routine pressure relief measures.  -- Monitor skin integrity 7. Fluids/Electrolytes/Nutrition: Monitor I/O.  --Check CMET in am 8. Diverticulitis/Colitis: Transitioned to cipro  and Flagyl  antibiotic D#   --WBC trending down today but also had drop in H/H. Monitor for signs of bleeding.    - Follow-up with gastroenterologist  outpatient 9. HTN: Monitor BP TID--avoid hypoperfusion. BP meds have been resumed.  Monitor with activity. --On amlodipine , 10. COPD: On Breztri  bid with Fasenra  every 8 weeks. 11. RA: Stable on Leflunomide .  12. Hypokalemia: Being supplemented daily but still low --Recheck in am 13. Hypomagnesemia: Recheck in am.  14.  ABLA: Monitor for rectal bleeding. Hgb down from 12.8 to 11.2. Back up to 13.0 on 9/20 15, Delirium: Resolving but needed ativan   16. Leukocytosis: 12.0 on 9/20, see #8  Sharlet GORMAN Schmitz, PA-C 04/17/2024  I have personally performed a face to  face diagnostic evaluation of this patient and formulated the key components of the plan.  Additionally, I have personally reviewed laboratory data, imaging studies, as well as relevant notes and concur with the physician assistant's documentation above.  The patient's status has not changed from the original H&P.  Any changes in documentation from the acute care chart have been noted above.  Murray Collier, MD

## 2024-04-15 NOTE — Plan of Care (Signed)
  Problem: Education: Goal: Knowledge of General Education information will improve Description: Including pain rating scale, medication(s)/side effects and non-pharmacologic comfort measures Outcome: Progressing   Problem: Health Behavior/Discharge Planning: Goal: Ability to manage health-related needs will improve Outcome: Progressing   Problem: Clinical Measurements: Goal: Ability to maintain clinical measurements within normal limits will improve Outcome: Progressing Goal: Will remain free from infection Outcome: Progressing Goal: Diagnostic test results will improve Outcome: Progressing Goal: Respiratory complications will improve Outcome: Progressing Goal: Cardiovascular complication will be avoided Outcome: Progressing   Problem: Activity: Goal: Risk for activity intolerance will decrease Outcome: Progressing   Problem: Nutrition: Goal: Adequate nutrition will be maintained Outcome: Progressing   Problem: Coping: Goal: Level of anxiety will decrease Outcome: Progressing   Problem: Elimination: Goal: Will not experience complications related to bowel motility Outcome: Progressing Goal: Will not experience complications related to urinary retention Outcome: Progressing   Problem: Pain Managment: Goal: General experience of comfort will improve and/or be controlled Outcome: Progressing   Problem: Safety: Goal: Ability to remain free from injury will improve Outcome: Progressing   Problem: Skin Integrity: Goal: Risk for impaired skin integrity will decrease Outcome: Progressing   Problem: Education: Goal: Knowledge of disease or condition will improve Outcome: Progressing Goal: Knowledge of secondary prevention will improve (MUST DOCUMENT ALL) Outcome: Progressing Goal: Knowledge of patient specific risk factors will improve (DELETE if not current risk factor) Outcome: Progressing   Problem: Ischemic Stroke/TIA Tissue Perfusion: Goal: Complications of  ischemic stroke/TIA will be minimized Outcome: Progressing   Problem: Coping: Goal: Will verbalize positive feelings about self Outcome: Progressing Goal: Will identify appropriate support needs Outcome: Progressing   Problem: Health Behavior/Discharge Planning: Goal: Ability to manage health-related needs will improve Outcome: Progressing Goal: Goals will be collaboratively established with patient/family Outcome: Progressing   Problem: Self-Care: Goal: Ability to participate in self-care as condition permits will improve Outcome: Progressing Goal: Verbalization of feelings and concerns over difficulty with self-care will improve Outcome: Progressing Goal: Ability to communicate needs accurately will improve Outcome: Progressing   Problem: Nutrition: Goal: Risk of aspiration will decrease Outcome: Progressing Goal: Dietary intake will improve Outcome: Progressing   Problem: Education: Goal: Knowledge of disease or condition will improve Outcome: Progressing Goal: Knowledge of secondary prevention will improve (MUST DOCUMENT ALL) Outcome: Progressing Goal: Knowledge of patient specific risk factors will improve (DELETE if not current risk factor) Outcome: Progressing   Problem: Ischemic Stroke/TIA Tissue Perfusion: Goal: Complications of ischemic stroke/TIA will be minimized Outcome: Progressing   Problem: Coping: Goal: Will verbalize positive feelings about self Outcome: Progressing Goal: Will identify appropriate support needs Outcome: Progressing   Problem: Health Behavior/Discharge Planning: Goal: Ability to manage health-related needs will improve Outcome: Progressing Goal: Goals will be collaboratively established with patient/family Outcome: Progressing   Problem: Self-Care: Goal: Ability to participate in self-care as condition permits will improve Outcome: Progressing Goal: Verbalization of feelings and concerns over difficulty with self-care will  improve Outcome: Progressing Goal: Ability to communicate needs accurately will improve Outcome: Progressing   Problem: Nutrition: Goal: Risk of aspiration will decrease Outcome: Progressing Goal: Dietary intake will improve Outcome: Progressing

## 2024-04-16 ENCOUNTER — Ambulatory Visit

## 2024-04-16 ENCOUNTER — Ambulatory Visit: Admitting: Allergy & Immunology

## 2024-04-16 DIAGNOSIS — G9341 Metabolic encephalopathy: Secondary | ICD-10-CM | POA: Diagnosis not present

## 2024-04-16 LAB — CBC
HCT: 39 % (ref 36.0–46.0)
Hemoglobin: 12.3 g/dL (ref 12.0–15.0)
MCH: 28.4 pg (ref 26.0–34.0)
MCHC: 31.5 g/dL (ref 30.0–36.0)
MCV: 90.1 fL (ref 80.0–100.0)
Platelets: 223 K/uL (ref 150–400)
RBC: 4.33 MIL/uL (ref 3.87–5.11)
RDW: 14.1 % (ref 11.5–15.5)
WBC: 14.5 K/uL — ABNORMAL HIGH (ref 4.0–10.5)
nRBC: 0 % (ref 0.0–0.2)

## 2024-04-16 LAB — BASIC METABOLIC PANEL WITH GFR
Anion gap: 8 (ref 5–15)
BUN: 11 mg/dL (ref 8–23)
CO2: 21 mmol/L — ABNORMAL LOW (ref 22–32)
Calcium: 9.6 mg/dL (ref 8.9–10.3)
Chloride: 110 mmol/L (ref 98–111)
Creatinine, Ser: 1.22 mg/dL — ABNORMAL HIGH (ref 0.44–1.00)
GFR, Estimated: 46 mL/min — ABNORMAL LOW (ref >60)
Glucose, Bld: 101 mg/dL — ABNORMAL HIGH (ref 70–99)
Potassium: 3.6 mmol/L (ref 3.5–5.1)
Sodium: 139 mmol/L (ref 135–145)

## 2024-04-16 MED ORDER — AMLODIPINE BESYLATE 5 MG PO TABS
10.0000 mg | ORAL_TABLET | Freq: Every day | ORAL | Status: DC
Start: 2024-04-16 — End: 2024-04-17
  Administered 2024-04-16 – 2024-04-17 (×2): 10 mg via ORAL
  Filled 2024-04-16 (×2): qty 2

## 2024-04-16 NOTE — Plan of Care (Signed)

## 2024-04-16 NOTE — Progress Notes (Signed)
 Occupational Therapy Treatment Patient Details Name: Betty Golden MRN: [id number] DOB: 08-15-1945 Today's Date: 04/16/2024   History of present illness 78 y.o. F admitted on 04/12/24 due to increased confusion. Pt was found walking around her home nude, hallucinating, and not eating or drinking. 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 Pt is aphasic. PMH significant for RA, HTN, COPD.   OT comments  Pt completed this session with improving cognition and mobility. She is able to respond to questions in writing with 90% recall. She continues to require min to mod assist with transfers/mobility and BADL's. Pr continues to benefit from skilled OT and will benefit from intensive OT 3+ hours a day.       If plan is discharge home, recommend the following:  A lot of help with walking and/or transfers;A little help with bathing/dressing/bathroom;Assistance with cooking/housework;Direct supervision/assist for medications management;Assist for transportation;Help with stairs or ramp for entrance;Supervision due to cognitive status   Equipment Recommendations  None recommended by OT    Recommendations for Other Services Rehab consult    Precautions / Restrictions Precautions Precautions: Fall Recall of Precautions/Restrictions: Impaired Restrictions Weight Bearing Restrictions Per Provider Order: No       Mobility Bed Mobility               General bed mobility comments: Up in recliner on entry    Transfers Overall transfer level: Needs assistance Equipment used: Rolling walker (2 wheels) Transfers: Sit to/from Stand, Bed to chair/wheelchair/BSC Sit to Stand: Mod assist     Step pivot transfers: Mod assist, Min assist     General transfer comment: increased effort and time, improving to min with prolonged standing     Balance Overall balance assessment: Needs assistance Sitting-balance support: Feet supported, No upper extremity supported Sitting  balance-Leahy Scale: Good Sitting balance - Comments: seated in chair   Standing balance support: Reliant on assistive device for balance, During functional activity Standing balance-Leahy Scale: Poor                             ADL either performed or assessed with clinical judgement   ADL Overall ADL's : Needs assistance/impaired Eating/Feeding: Set up;Sitting Eating/Feeding Details (indicate cue type and reason): finishing snack and drink at beginning of session Grooming: Set up Grooming Details (indicate cue type and reason): Provided washcloth, able to wash face and hands with no assist.                 Toilet Transfer: Moderate assistance;Minimal assistance;Ambulation;Rolling walker (2 wheels) Toilet Transfer Details (indicate cue type and reason): increased time and effort, initially mod A, improving to min A with slight R sided lean Toileting- Clothing Manipulation and Hygiene: Contact guard assist;Minimal assistance;Sitting/lateral lean Toileting - Clothing Manipulation Details (indicate cue type and reason): Needs assist with clothing management       General ADL Comments: Pt appears to be improving with mobility and cognition.    Extremity/Trunk Assessment              Occupational psychologist Communication: Impaired Factors Affecting Communication: Difficulty expressing self;Reduced clarity of speech   Cognition Arousal: Alert Behavior During Therapy: WFL for tasks assessed/performed Cognition: Cognition impaired             OT - Cognition Comments: Pt unable to report her name or state  where she lived. Was able to write down answers correctly during conversation.                 Following commands: Impaired Following commands impaired: Follows one step commands with increased time      Cueing   Cueing Techniques: Verbal cues, Gestural cues, Tactile cues  Exercises       Shoulder Instructions       General Comments VSS on RA    Pertinent Vitals/ Pain       Pain Assessment Pain Assessment: No/denies pain  Home Living                                          Prior Functioning/Environment              Frequency  Min 3X/week        Progress Toward Goals  OT Goals(current goals can now be found in the care plan section)  Progress towards OT goals: Progressing toward goals  Acute Rehab OT Goals Patient Stated Goal: To go to rehab OT Goal Formulation: With patient Time For Goal Achievement: 04/27/24 Potential to Achieve Goals: Good ADL Goals Pt Will Perform Grooming: with modified independence;standing Pt Will Perform Upper Body Dressing: with modified independence Pt Will Perform Lower Body Dressing: with modified independence Pt Will Transfer to Toilet: with modified independence;ambulating Pt Will Perform Toileting - Clothing Manipulation and hygiene: with modified independence;sit to/from stand;sitting/lateral leans Pt/caregiver will Perform Home Exercise Program: Increased strength;Left upper extremity;Independently  Plan      Co-evaluation                 AM-PAC OT 6 Clicks Daily Activity     Outcome Measure   Help from another person eating meals?: None Help from another person taking care of personal grooming?: A Little Help from another person toileting, which includes using toliet, bedpan, or urinal?: A Lot Help from another person bathing (including washing, rinsing, drying)?: A Lot Help from another person to put on and taking off regular upper body clothing?: A Little Help from another person to put on and taking off regular lower body clothing?: A Lot 6 Click Score: 16    End of Session Equipment Utilized During Treatment: Rolling walker (2 wheels)  OT Visit Diagnosis: Unsteadiness on feet (R26.81);Other abnormalities of gait and mobility (R26.89);Muscle weakness (generalized)  (M62.81);Other symptoms and signs involving the nervous system (R29.898);Other symptoms and signs involving cognitive function;Hemiplegia and hemiparesis Hemiplegia - Right/Left: Left Hemiplegia - dominant/non-dominant: Non-Dominant Hemiplegia - caused by: Cerebral infarction   Activity Tolerance Patient tolerated treatment well   Patient Left in chair;with call bell/phone within reach;with chair alarm set   Nurse Communication Mobility status        Time: 1341-1415 OT Time Calculation (min): 34 min  Charges: OT General Charges $OT Visit: 1 Visit OT Treatments $Self Care/Home Management : 8-22 mins $Therapeutic Activity: 8-22 mins  Valentin Nightingale, OTR/L [facility] Acute Rehab  Gregory Barrick Elane Nightingale 04/16/2024, 3:20 PM

## 2024-04-16 NOTE — Progress Notes (Signed)
Safety Sitter at bedside 

## 2024-04-16 NOTE — Progress Notes (Addendum)
 Inpatient Rehab Admissions Coordinator:  Insurance approval received. Will have bed on Saturday. Will follow up in the morning to arrange transportation with [facility]. Nurse can call [phone] to give report after 12pm on Saturday. Will continue to follow.   Tinnie Yvone Cohens, MS, CCC-SLP Admissions Coordinator [phone]

## 2024-04-16 NOTE — Progress Notes (Signed)
 Physician Discharge Summary   Patient: Betty Golden MRN: [id number] DOB: 10/21/1945  Admit date:     04/12/2024  Discharge date: 04/16/24  Discharge Physician: Adriana DELENA Grams   PCP: Irven Ozell DEL, MD   The patient was seen and examined.  No changes in medical management  Pending discharge Inpatient rehab-pending insurance approval  Recommendations at discharge:    Follow-up with neurology in 1-4 weeks Per neurology recommendation continue Aspirin  + plavix  for 3 months,  followed by aspirin  81 mg           daily -continue statins Recommend continue PT OT, fall precaution, speech therapy Complete current dual antibiotics as recommended Follow-up with gastroenterologist in 4-6 weeks for further evaluation possible outpatient GI workup         (EGD/colonoscopy)  Discharge Diagnoses: Principal Problem:   Acute metabolic encephalopathy Active Problems:   RA (rheumatoid arthritis) (HCC)   Essential hypertension   COPD (chronic obstructive pulmonary disease) (HCC)  Resolved Problems:   * No resolved hospital problems. *  Hospital Course: Betty Golden is a 78 y.o. female with a history of RA, HTN, COPD who presented to the ED on 04/12/2024 when family reported she was becoming increasingly confused. Formerly independent, she began walking around the house nude, hallucinating, not eating or drinking. She was afebrile, leukocytosis, hypokalemia, head CT showed suspected recent infarct in right parietal white matter. CT abd/pelvis showed mild sigmoid diverticulitis, descending and sigmoid colitis vs. nondistention. Antibiotics given, IVF given, and patient admitted. Did not tolerate MRI. Metabolic derangements have improved, leukocytosis improving. Mental status remains abnormal. Neurology consulted.   Acute metabolic encephalopathy -Mentation improved awake alert oriented-frustrated with word finding - Aphasic -Improved confusion, hallucination -  aphasic difficulty with  word finding and communication - Patient refused to go for MRI-despite offering sedative such as Ativan  (Unable to complete MRI previously due to coordination, cooperation of patient)    - Head CT-suggest recent infarct,  - Continue neurochecks-no new focal neurological findings - Continue aspirin , Plavix , statins -for stroke prevention Will continue aspirin  Plavix  for 21 days, then continue with monotherapy Plavix     Acute Stroke -  - With expressive aphasia- Per neurology recommendation: Aspirin  + plavix  for 3 months,  followed by aspirin  81 mg  daily  Continue statins - LDL goal <  70 -CTA revealed :  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  Sigmoid diverticulitis -Improved leukocytosis -No other signs of infection - CTAP WC also shows mild sigmoid diverticulitis/colitis.   Currently no pyelonephritis- but UA not suggestive of UTI. -Changed IV antibiotics of ciprofloxacin /Flagyl  to p.o.   Hypokalemia - Continue to monitor and replete monitoring magnesium  level    -Echo:EJF  70 to 75%. The  left ventricle has hyperdynamic function. Left ventricular endocardial  border not optimally defined to evaluate regional wall motion. Left  ventricular diastolic parameters are  consistent with Grade I diastolic dysfunction (impaired relaxation).  Elevated left atrial pressure.   2. Right ventricular systolic function is normal.    Acute sigmoid diverticulitis:  Presenting with abdominal pain, leukocytosis of 18.7 >>> 13.2 >>> 11.3 - Sepsis was ruled out -  CT AP WC- Mild sigmoid colonic diverticulitis. No perforation or abscess. Descending and sigmoid colonic wall thickening versus nondistention, can be seen with colitis. - Will continue  IV ceftriaxone  and metronidazole --- switch to p.o.     RA: Reports generalized body aches.  But no obvious swelling  of any joints. Has nodules. She is on leflunomide  and satralizumab injections  q14days. - Resume leflunomide    Essential hypertension -BP meds were held to allow for permissive hypertension - Will resume Norvasc  Norvasc , added metoprolol  discontinued lisinopril/HCTZ     COPD: Quiescent Stable.  She is on Fasenra  injections every 8 weeks. - Resume home regimen.     Ethics: Full code  Consultants: Teleneurology Procedures performed: Stroke workup: CT/CTA head and neck, echo, c Disposition: Rehabilitation facility Diet recommendation:  Discharge Diet Orders (From admission, onward)     Start     Ordered   04/15/24 0000  Diet - low sodium heart healthy        04/15/24 1244           Cardiac and Carb modified diet DISCHARGE MEDICATION: Allergies as of 04/16/2024       Reactions   Hydromorphone  Anaphylaxis   Sulfa Antibiotics Rash        Medication List     STOP taking these medications    atorvastatin  10 MG tablet Commonly known as: LIPITOR   CALCIUM  PO   cephALEXin 500 MG capsule Commonly known as: KEFLEX   losartan -hydrochlorothiazide  100-12.5 MG tablet Commonly known as: HYZAAR   MAGNESIUM  PO   potassium chloride  SA 20 MEQ tablet Commonly known as: KLOR-CON  M   pregabalin  50 MG capsule Commonly known as: LYRICA    ZYRTEC PO       TAKE these medications    albuterol  108 (90 Base) MCG/ACT inhaler Commonly known as: ProAir  HFA Inhale 2 puffs into the lungs every 4 (four) hours as needed for wheezing or shortness of breath.   amLODipine  10 MG tablet Commonly known as: NORVASC  Take 10 mg by mouth daily.   aspirin  EC 81 MG tablet Take 1 tablet (81 mg total) by mouth daily. Swallow whole.   CIMZIA Lander Inject 1 Dose into the skin every 14 (fourteen) days.   ciprofloxacin  500 MG tablet Commonly known as: CIPRO  Take 1 tablet (500 mg total) by mouth 2 (two) times daily for 7 days.   clindamycin 1 % lotion Commonly known as: CLEOCIN T Apply 1 Application topically daily.   clopidogrel  75 MG tablet Commonly known as:  PLAVIX  Take 1 tablet (75 mg total) by mouth daily.   colestipol  1 g tablet Commonly known as: COLESTID  Take 2 g by mouth daily. Take 4 hours before or after other medications.   cyanocobalamin  1000 MCG tablet Commonly known as: VITAMIN B12 Take 1 tablet (1,000 mcg total) by mouth daily.   DULoxetine  30 MG capsule Commonly known as: CYMBALTA  Take 30 mg by mouth 2 (two) times daily.   Fasenra  30 MG/ML prefilled syringe Generic drug: benralizumab  Inject 1 mL (30 mg total) into the skin every 8 (eight) weeks.   fluticasone  50 MCG/ACT nasal spray Commonly known as: FLONASE  SPRAY 2 SPRAYS INTO EACH NOSTRIL EVERY DAY   IRON PO Take 65 mg by mouth daily.   leflunomide  20 MG tablet Commonly known as: ARAVA  Take 20 mg by mouth daily.   metoprolol  succinate 25 MG 24 hr tablet Commonly known as: Toprol  XL Take 1 tablet (25 mg total) by mouth daily.   metroNIDAZOLE  500 MG tablet Commonly known as: FLAGYL  Take 1 tablet (500 mg total) by mouth every 8 (eight) hours for 7 days.   MULTIVITAMIN PO Take 1 tablet by mouth daily at 6 (six) AM.   pantoprazole  40 MG tablet Commonly known as: PROTONIX  Take 1 tablet (40 mg total)  by mouth daily.   Restasis  0.05 % ophthalmic emulsion Generic drug: cycloSPORINE  Place 1 drop into both eyes 2 (two) times daily.   rosuvastatin  20 MG tablet Commonly known as: CRESTOR  Take 2 tablets (40 mg total) by mouth daily.   Trelegy Ellipta  200-62.5-25 MCG/ACT Aepb Generic drug: Fluticasone -Umeclidin-Vilant Inhale 1 puff into the lungs in the morning.        Discharge Exam: Filed Weights   04/12/24 1347  Weight: 71 kg          General:  AAO x 3,  cooperative, no distress;   HEENT:  Normocephalic, PERRL, otherwise with in Normal limits   Neuro:  Expressive aphasia, no change in speech pattern CNII-XII intact. , normal motor and sensation, reflexes intact   Lungs:   Clear to auscultation BL, Respirations unlabored,  No wheezes /  crackles  Cardio:    S1/S2, RRR, No murmure, No Rubs or Gallops   Abdomen:  Soft, non-tender, bowel sounds active all four quadrants, no guarding or peritoneal signs.  Muscular  skeletal:  Limited exam -global generalized weaknesses - in bed, able to move all 4 extremities,   2+ pulses,  symmetric, No pitting edema  Skin:  Dry, warm to touch, negative for any Rashes,  Wounds: Please see nursing documentation             Condition at discharge: fair  The results of significant diagnostics from this hospitalization (including imaging, microbiology, ancillary and laboratory) are listed below for reference.   Imaging Studies: ECHOCARDIOGRAM COMPLETE Result Date: 04/14/2024    ECHOCARDIOGRAM REPORT   Patient Name:   TYLENE QUASHIE Date of Exam: 04/14/2024 Medical Rec #:  [id number]         Height:       63.0 in Accession #:    [id number]        Weight:       156.5 lb Date of Birth:  1946-04-28         BSA:          1.742 m Patient Age:    78 years          BP:           153/94 mmHg Patient Gender: F                 HR:           108 bpm. Exam Location:  [facility] Procedure: 2D Echo, 3D Echo, Cardiac Doppler and Color Doppler (Both Spectral            and Color Flow Doppler were utilized during procedure). Indications:    Stroke I63.9  History:        Patient has prior history of Echocardiogram examinations, most                 recent 04/17/2019. Stroke. H/o Hypertension, Hyperlipidemia,                 Coronary artery disease.  Sonographer:    BERNARDA ROCKS Referring Phys: [id number] RYAN B GRUNZ IMPRESSIONS  1. Left ventricular ejection fraction, by estimation, is 70 to 75%. The left ventricle has hyperdynamic function. Left ventricular endocardial border not optimally defined to evaluate regional wall motion. Left ventricular diastolic parameters are consistent with Grade I diastolic dysfunction (impaired relaxation). Elevated left atrial pressure.  2. Right ventricular systolic function is normal.  The right ventricular size is normal. Tricuspid regurgitation signal is inadequate for assessing PA pressure.  3. The mitral valve is normal in structure. No evidence of mitral valve regurgitation. No evidence of mitral stenosis.  4. The aortic valve was not well visualized. Aortic valve regurgitation is not visualized. No aortic stenosis is present. Aortic valve area, by VTI measures 1.48 cm. Aortic valve mean gradient measures 7.0 mmHg. Aortic valve Vmax measures 1.88 m/s.  5. The inferior vena cava is normal in size with greater than 50% respiratory variability, suggesting right atrial pressure of 3 mmHg. Conclusion(s)/Recommendation(s): No intracardiac source of embolism detected on this transthoracic study. Consider a transesophageal echocardiogram to exclude cardiac source of embolism if clinically indicated. FINDINGS  Left Ventricle: Left ventricular ejection fraction, by estimation, is 70 to 75%. The left ventricle has hyperdynamic function. Left ventricular endocardial border not optimally defined to evaluate regional wall motion. The left ventricular internal cavity size was normal in size. There is no left ventricular hypertrophy. Left ventricular diastolic parameters are consistent with Grade I diastolic dysfunction (impaired relaxation). Elevated left atrial pressure. Right Ventricle: The right ventricular size is normal. No increase in right ventricular wall thickness. Right ventricular systolic function is normal. Tricuspid regurgitation signal is inadequate for assessing PA pressure. Left Atrium: Left atrial size was normal in size. Right Atrium: Right atrial size was normal in size. Pericardium: There is no evidence of pericardial effusion. Mitral Valve: The mitral valve is normal in structure. Mild to moderate mitral annular calcification. No evidence of mitral valve regurgitation. No evidence of mitral valve stenosis. MV peak gradient, 16.0 mmHg. The mean mitral valve gradient is 6.0 mmHg.  Tricuspid Valve: The tricuspid valve is normal in structure. Tricuspid valve regurgitation is not demonstrated. No evidence of tricuspid stenosis. Aortic Valve: The aortic valve was not well visualized. Aortic valve regurgitation is not visualized. No aortic stenosis is present. Aortic valve mean gradient measures 7.0 mmHg. Aortic valve peak gradient measures 14.1 mmHg. Aortic valve area, by VTI measures 1.48 cm. Pulmonic Valve: The pulmonic valve was not well visualized. Pulmonic valve regurgitation is not visualized. No evidence of pulmonic stenosis. Aorta: The aortic root is normal in size and structure. Venous: The inferior vena cava is normal in size with greater than 50% respiratory variability, suggesting right atrial pressure of 3 mmHg. IAS/Shunts: No atrial level shunt detected by color flow Doppler.  LEFT VENTRICLE PLAX 2D LVIDd:         4.50 cm     Diastology LVIDs:         2.70 cm     LV e' medial:    8.02 cm/s LV PW:         0.90 cm     LV E/e' medial:  15.0 LV IVS:        0.90 cm     LV e' lateral:   8.58 cm/s LVOT diam:     1.40 cm     LV E/e' lateral: 14.0 LV SV:         41 LV SV Index:   24 LVOT Area:     1.54 cm  LV Volumes (MOD) LV vol d, MOD A2C: 69.4 ml LV vol d, MOD A4C: 66.9 ml LV vol s, MOD A2C: 20.5 ml LV vol s, MOD A4C: 19.6 ml LV SV MOD A2C:     48.9 ml LV SV MOD A4C:     66.9 ml LV SV MOD BP:      48.5 ml RIGHT VENTRICLE             IVC RV  Basal diam:  2.40 cm     IVC diam: 1.30 cm RV S prime:     18.30 cm/s TAPSE (M-mode): 2.6 cm LEFT ATRIUM             Index        RIGHT ATRIUM          Index LA diam:        3.80 cm 2.18 cm/m   RA Area:     7.42 cm LA Vol (A2C):   36.0 ml 20.66 ml/m  RA Volume:   10.40 ml 5.97 ml/m LA Vol (A4C):   46.8 ml 26.86 ml/m LA Biplane Vol: 43.3 ml 24.85 ml/m  AORTIC VALVE                     PULMONIC VALVE AV Area (Vmax):    1.11 cm      PV Vmax:          1.77 m/s AV Area (Vmean):   1.24 cm      PV Peak grad:     12.5 mmHg AV Area (VTI):     1.48 cm       PR End Diast Vel: 2.94 msec AV Vmax:           188.00 cm/s AV Vmean:          120.000 cm/s AV VTI:            0.278 m AV Peak Grad:      14.1 mmHg AV Mean Grad:      7.0 mmHg LVOT Vmax:         135.00 cm/s LVOT Vmean:        96.900 cm/s LVOT VTI:          0.267 m LVOT/AV VTI ratio: 0.96  AORTA Ao Root diam: 2.20 cm Ao Asc diam:  2.50 cm MITRAL VALVE MV Area (PHT): 6.90 cm     SHUNTS MV Area VTI:   1.54 cm     Systemic VTI:  0.27 m MV Peak grad:  16.0 mmHg    Systemic Diam: 1.40 cm MV Mean grad:  6.0 mmHg MV Vmax:       2.00 m/s MV Vmean:      110.0 cm/s MV Decel Time: 110 msec MV E velocity: 120.00 cm/s MV A velocity: 197.00 cm/s MV E/A ratio:  0.61 Wilbert Bihari MD Electronically signed by Wilbert Bihari MD Signature Date/Time: 04/14/2024/4:43:05 PM    Final    CT ANGIO HEAD NECK W WO CM Addendum Date: 04/13/2024 ADDENDUM #1ADDENDUM: Findings discussed with Dr. Bryn at 6:56PM on 04/13/24. ---------------------------------------------------- Electronically signed by: Donnice Mania MD 04/13/2024 07:00 PM EDT RP Workstation: [id number]   Result Date: 04/13/2024 **ORIGINAL REPORT   EXAM: CT HEAD WITHOUT CTA HEAD AND NECK WITH AND WITHOUT 04/13/2024 05:51:02 PM TECHNIQUE: CTA of the head and neck was performed with and without the administration of intravenous contrast. Noncontrast CT of the head with reconstructed 2-D images are also provided for review. Multiplanar 2D and/or 3D reformatted images are provided for review. Automated exposure control, iterative reconstruction, and/or weight based adjustment of the mA/kV was utilized to reduce the radiation dose to as low as reasonably achievable. COMPARISON: MRI head 08/25 and CT head 04/12/24 CLINICAL HISTORY: Stroke, follow up. FINDINGS: CT HEAD: BRAIN AND VENTRICLES: No acute intracranial hemorrhage. Nonspecific hypoattenuation in the periventricular and subcortical white matter, most likely representing chronic microvascular ischemic changes. Similar  appearance of possible recent  infarct in the right parietal white matter. Postsurgical changes of the paranasal sinuses. ORBITS: Bilateral lens replacement. SINUSES AND MASTOIDS: Postsurgical changes of the paranasal sinuses. Mucosal thickening in the left maxillary sinus. CTA NECK: AORTIC ARCH AND ARCH VESSELS: Mild atherosclerosis of the visualized aortic arch. CERVICAL CAROTID ARTERIES: The right carotid artery is patent from the origin to the skull base. Mild atherosclerosis at the right carotid bifurcation without hemodynamically significant stenosis. The left carotid artery is patent from the origin to the skull base. Mild atherosclerosis at the left carotid bifurcation without hemodynamically significant stenosis. CERVICAL VERTEBRAL ARTERIES: The vertebral arteries are patent from the origins to the vertebrobasilar confluence. Atherosclerosis of the left vertebral artery at C2 resulting in mild stenosis. LUNGS AND MEDIASTINUM: Opacities in the peripheral right upper lobe, which may reflect scarring. SOFT TISSUES: No acute abnormality. BONES: Changes in the visualized spine with prominent endplate osteophytes at multiple levels. Edentulous maxilla and mandible. CTA HEAD: ANTERIOR CIRCULATION: The intracranial internal carotid arteries are patent bilaterally. There is mild atherosclerosis of the carotid siphons. Mild stenosis of the right cavernous ICA. The right MCA is patent. 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 There is reconstitution of the M2 branch on the left within the sylvian fissure, although the vessel appears diminutive in caliber and irregular. Overall diminished number and caliber of MCA branches within the posterior aspect of the left MCA territory compared to the right. The anterior cerebral arteries are patent bilaterally. POSTERIOR CIRCULATION: No significant stenosis of the posterior cerebral arteries. No significant stenosis of the  basilar artery. No significant stenosis of the vertebral arteries. OTHER: No dural venous sinus thrombosis on this non-dedicated study.   IMPRESSION: 1. 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 2. Reconstitution of the M2 branch on the left within the sylvian fissure, although the vessel appears diminutive in caliber and irregular. Overall diminished number and caliber of MCA branches within the posterior aspect of the left MCA territory compared to the right. 3. No acute intracranial hemorrhage. 4. Mild atherosclerosis of the visualized aortic arch, carotid bifurcations, and carotid siphons without hemodynamically significant stenosis. Mild stenosis of the right cavernous ICA. Electronically signed by: Donnice Mania MD 04/13/2024 06:17 PM EDT RP Workstation: [id number]   CT ABDOMEN PELVIS W CONTRAST Result Date: 04/12/2024 CLINICAL DATA:  Acute abdominal pain.  Dizziness and multiple falls. EXAM: CT ABDOMEN AND PELVIS WITH CONTRAST TECHNIQUE: Multidetector CT imaging of the abdomen and pelvis was performed using the standard protocol following bolus administration of intravenous contrast. RADIATION DOSE REDUCTION: This exam was performed according to the departmental dose-optimization program which includes automated exposure control, adjustment of the mA and/or kV according to patient size and/or use of iterative reconstruction technique. CONTRAST:  80mL OMNIPAQUE  IOHEXOL  300 MG/ML  SOLN COMPARISON:  CT 09/25/2021 FINDINGS: Lower chest: Chronic cystic changes in the lung bases. No acute airspace disease. Hepatobiliary: Post cholecystectomy. Prominence of the central intrahepatic and extrahepatic common bile duct likely related to prior cholecystectomy. The distal CBD measures 9 mm, normal for postcholecystectomy state. No focal liver abnormality. Pancreas: Atrophic.  No ductal dilatation or inflammation. Spleen: Normal in size without focal abnormality.  Adrenals/Urinary Tract: No adrenal nodule. No hydronephrosis. Simple appearing cyst in the mid right kidney. No further follow-up imaging is recommended. Lobulated right renal contours suggestive of scarring. Heterogeneous enhancement of the upper pole of the right  kidney. No perinephric stranding. No renal calculi. Decompressed ureters. Partially distended urinary bladder, normal for degree of distension. Stomach/Bowel: Multiple sigmoid colonic diverticula with mild inflammation about the proximal sigmoid, suggestive of mild diverticulitis. No perforation or abscess. Descending and sigmoid colonic wall thickening versus nondistention. Appendectomy per history. There is no small bowel inflammation or obstruction. Stomach is partially distended without wall thickening. Vascular/Lymphatic: Aorto bi-iliac atherosclerosis. No aortic aneurysm. The portal vein is patent. No enlarged lymph nodes in the abdomen or pelvis. Reproductive: Uterus and bilateral adnexa are unremarkable. Other: No free air. No abdominopelvic collection. No free fluid. Small fat containing umbilical hernia. Musculoskeletal: The bones are subjectively under mineralized. Degenerative change in the spine and hips. There are no acute or suspicious osseous abnormalities. IMPRESSION: 1. Mild sigmoid colonic diverticulitis. No perforation or abscess. 2. Descending and sigmoid colonic wall thickening versus nondistention, can be seen with colitis. 3. Heterogeneous enhancement of the upper pole of the right kidney. This may be related to scarring or pyelonephritis recommend correlation with urinalysis. Aortic Atherosclerosis (ICD10-I70.0). Electronically Signed   By: Andrea Gasman M.D.   On: 04/12/2024 17:05   CT Head Wo Contrast Result Date: 04/12/2024 CLINICAL DATA:  Mental status change, unknown cause. Hallucinations. Weakness and dizziness with multiple recent falls. EXAM: CT HEAD WITHOUT CONTRAST TECHNIQUE: Contiguous axial images were obtained  from the base of the skull through the vertex without intravenous contrast. RADIATION DOSE REDUCTION: This exam was performed according to the departmental dose-optimization program which includes automated exposure control, adjustment of the mA and/or kV according to patient size and/or use of iterative reconstruction technique. COMPARISON:  Head MRI 02/27/2024 and CT 09/25/2021 FINDINGS: Brain: There is an approximately 1.5 cm focus of hypodensity in the right parietal white matter near the posterior aspect of the right corona radiata which appears new or progressive from last month's MRI and is suspicious for a recent infarct. 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 No intracranial hemorrhage, mass, midline shift, hydrocephalus, or extra-axial fluid collection is identified. Vascular: Calcified atherosclerosis at the skull base. No hyperdense vessel. Skull: No fracture or suspicious lesion. Sinuses/Orbits: Prior functional endoscopic sinus surgery. Clear mastoid air cells. Bilateral cataract extraction. Other: None. IMPRESSION: 1. Suspected recent infarct in the right parietal white matter. 2. No intracranial hemorrhage. 3. Chronic small vessel ischemia. Electronically Signed   By: Dasie Hamburg M.D.   On: 04/12/2024 16:48   DG Chest Portable 1 View Result Date: 04/12/2024 CLINICAL DATA:  Weakness. EXAM: PORTABLE CHEST 1 VIEW COMPARISON:  01/21/2023 FINDINGS: Both lungs are clear. Heart size is normal. Trachea is midline. Negative for a pneumothorax. Degenerative changes at both shoulders and AC joints. Bridging osteophytes along the right side of the thoracic spine. IMPRESSION: No active disease. Electronically Signed   By: Juliene Balder M.D.   On: 04/12/2024 14:22    Microbiology: Results for orders placed or performed in visit on 01/18/22  Microscopic Examination     Status: Abnormal    Collection Time: 01/18/22  1:11 PM   Urine  Result Value Ref Range Status   WBC, UA 6-10 (A) 0 - 5 /hpf Final   RBC, Urine None seen 0 - 2 /hpf Final   Epithelial Cells (non renal) 0-10 0 - 10 /hpf Final   Renal Epithel, UA None seen None seen /hpf Final   Bacteria, UA Few (A) None seen/Few Final  Labs: CBC: Recent Labs  Lab 04/12/24 1434 04/13/24 0452 04/14/24 0448 04/15/24 0509 04/16/24 0355  WBC 18.7* 12.0* 13.2* 11.3* 14.5*  NEUTROABS 12.9*  --   --   --   --   HGB 12.8 11.2* 12.3 11.2* 12.3  HCT 38.1 34.8* 37.8 34.2* 39.0  MCV 85.0 89.9 90.0 88.6 90.1  PLT 257 205 202 200 223   Basic Metabolic Panel: Recent Labs  Lab 04/12/24 1434 04/13/24 0452 04/14/24 0448 04/15/24 0509 04/16/24 0355  NA 140 141 141 142 139  K 2.6* 3.1* 3.2* 3.2* 3.6  CL 101 111 111 114* 110  CO2 21* 21* 17* 22 21*  GLUCOSE 88 80 72 85 101*  BUN 20 14 10  7* 11  CREATININE 1.35* 0.95 1.00 0.97 1.22*  CALCIUM  10.1 8.5* 9.1 8.6* 9.6  MG 1.8  --  2.0  --   --    Liver Function Tests: Recent Labs  Lab 04/12/24 1434 04/14/24 0448  AST 30 37  ALT 24 22  ALKPHOS 57 46  BILITOT 1.4* 0.6  PROT 7.6 6.5  ALBUMIN 4.1 3.3*   CBG: No results for input(s): GLUCAP in the last 168 hours.  Discharge time spent: greater than 30 minutes.  Signed: Adriana DELENA Grams, MD [facility] 04/16/2024

## 2024-04-17 ENCOUNTER — Inpatient Hospital Stay (HOSPITAL_COMMUNITY)
Admission: AD | Admit: 2024-04-17 | Discharge: 2024-04-28 | DRG: 057 | Disposition: A | Discharge status: Home Health | Source: Other Acute Inpatient Hospital | Attending: Physical Medicine & Rehabilitation | Admitting: Physical Medicine & Rehabilitation

## 2024-04-17 ENCOUNTER — Encounter (HOSPITAL_COMMUNITY): Payer: Self-pay | Admitting: Physical Medicine & Rehabilitation

## 2024-04-17 ENCOUNTER — Other Ambulatory Visit: Payer: Self-pay

## 2024-04-17 DIAGNOSIS — I2510 Atherosclerotic heart disease of native coronary artery without angina pectoris: Secondary | ICD-10-CM | POA: Diagnosis present

## 2024-04-17 DIAGNOSIS — G9341 Metabolic encephalopathy: Secondary | ICD-10-CM

## 2024-04-17 DIAGNOSIS — R202 Paresthesia of skin: Secondary | ICD-10-CM | POA: Diagnosis present

## 2024-04-17 DIAGNOSIS — Z79899 Other long term (current) drug therapy: Secondary | ICD-10-CM

## 2024-04-17 DIAGNOSIS — M069 Rheumatoid arthritis, unspecified: Secondary | ICD-10-CM | POA: Diagnosis present

## 2024-04-17 DIAGNOSIS — E162 Hypoglycemia, unspecified: Secondary | ICD-10-CM | POA: Diagnosis not present

## 2024-04-17 DIAGNOSIS — Z825 Family history of asthma and other chronic lower respiratory diseases: Secondary | ICD-10-CM

## 2024-04-17 DIAGNOSIS — J449 Chronic obstructive pulmonary disease, unspecified: Secondary | ICD-10-CM | POA: Diagnosis present

## 2024-04-17 DIAGNOSIS — Z885 Allergy status to narcotic agent status: Secondary | ICD-10-CM

## 2024-04-17 DIAGNOSIS — I69398 Other sequelae of cerebral infarction: Secondary | ICD-10-CM | POA: Diagnosis not present

## 2024-04-17 DIAGNOSIS — R1310 Dysphagia, unspecified: Secondary | ICD-10-CM | POA: Diagnosis present

## 2024-04-17 DIAGNOSIS — I69391 Dysphagia following cerebral infarction: Secondary | ICD-10-CM | POA: Diagnosis not present

## 2024-04-17 DIAGNOSIS — K219 Gastro-esophageal reflux disease without esophagitis: Secondary | ICD-10-CM | POA: Diagnosis present

## 2024-04-17 DIAGNOSIS — Z882 Allergy status to sulfonamides status: Secondary | ICD-10-CM

## 2024-04-17 DIAGNOSIS — D72829 Elevated white blood cell count, unspecified: Secondary | ICD-10-CM | POA: Diagnosis not present

## 2024-04-17 DIAGNOSIS — I69319 Unspecified symptoms and signs involving cognitive functions following cerebral infarction: Secondary | ICD-10-CM | POA: Diagnosis not present

## 2024-04-17 DIAGNOSIS — K529 Noninfective gastroenteritis and colitis, unspecified: Secondary | ICD-10-CM | POA: Diagnosis present

## 2024-04-17 DIAGNOSIS — E785 Hyperlipidemia, unspecified: Secondary | ICD-10-CM | POA: Diagnosis present

## 2024-04-17 DIAGNOSIS — Z87891 Personal history of nicotine dependence: Secondary | ICD-10-CM | POA: Diagnosis not present

## 2024-04-17 DIAGNOSIS — K5792 Diverticulitis of intestine, part unspecified, without perforation or abscess without bleeding: Secondary | ICD-10-CM | POA: Diagnosis not present

## 2024-04-17 DIAGNOSIS — Z8249 Family history of ischemic heart disease and other diseases of the circulatory system: Secondary | ICD-10-CM | POA: Diagnosis not present

## 2024-04-17 DIAGNOSIS — K5732 Diverticulitis of large intestine without perforation or abscess without bleeding: Secondary | ICD-10-CM | POA: Diagnosis present

## 2024-04-17 DIAGNOSIS — Z823 Family history of stroke: Secondary | ICD-10-CM

## 2024-04-17 DIAGNOSIS — Z9049 Acquired absence of other specified parts of digestive tract: Secondary | ICD-10-CM

## 2024-04-17 DIAGNOSIS — I69390 Apraxia following cerebral infarction: Secondary | ICD-10-CM

## 2024-04-17 DIAGNOSIS — I69320 Aphasia following cerebral infarction: Secondary | ICD-10-CM | POA: Diagnosis present

## 2024-04-17 DIAGNOSIS — D62 Acute posthemorrhagic anemia: Secondary | ICD-10-CM | POA: Diagnosis present

## 2024-04-17 DIAGNOSIS — Z833 Family history of diabetes mellitus: Secondary | ICD-10-CM

## 2024-04-17 DIAGNOSIS — R410 Disorientation, unspecified: Secondary | ICD-10-CM

## 2024-04-17 DIAGNOSIS — R630 Anorexia: Secondary | ICD-10-CM | POA: Diagnosis present

## 2024-04-17 DIAGNOSIS — G8191 Hemiplegia, unspecified affecting right dominant side: Secondary | ICD-10-CM | POA: Diagnosis not present

## 2024-04-17 DIAGNOSIS — R5383 Other fatigue: Secondary | ICD-10-CM | POA: Diagnosis not present

## 2024-04-17 DIAGNOSIS — E876 Hypokalemia: Secondary | ICD-10-CM | POA: Diagnosis present

## 2024-04-17 DIAGNOSIS — I63512 Cerebral infarction due to unspecified occlusion or stenosis of left middle cerebral artery: Secondary | ICD-10-CM

## 2024-04-17 DIAGNOSIS — J310 Chronic rhinitis: Secondary | ICD-10-CM | POA: Diagnosis present

## 2024-04-17 DIAGNOSIS — Z8619 Personal history of other infectious and parasitic diseases: Secondary | ICD-10-CM

## 2024-04-17 DIAGNOSIS — I10 Essential (primary) hypertension: Secondary | ICD-10-CM | POA: Diagnosis present

## 2024-04-17 DIAGNOSIS — M797 Fibromyalgia: Secondary | ICD-10-CM | POA: Diagnosis present

## 2024-04-17 HISTORY — DX: Cerebral infarction due to unspecified occlusion or stenosis of left middle cerebral artery: I63.512

## 2024-04-17 LAB — BASIC METABOLIC PANEL WITH GFR
Anion gap: 11 (ref 5–15)
BUN: 10 mg/dL (ref 8–23)
CO2: 19 mmol/L — ABNORMAL LOW (ref 22–32)
Calcium: 9.4 mg/dL (ref 8.9–10.3)
Chloride: 111 mmol/L (ref 98–111)
Creatinine, Ser: 1.07 mg/dL — ABNORMAL HIGH (ref 0.44–1.00)
GFR, Estimated: 54 mL/min — ABNORMAL LOW (ref >60)
Glucose, Bld: 108 mg/dL — ABNORMAL HIGH (ref 70–99)
Potassium: 3.6 mmol/L (ref 3.5–5.1)
Sodium: 141 mmol/L (ref 135–145)

## 2024-04-17 LAB — CBC
HCT: 40.1 % (ref 36.0–46.0)
Hemoglobin: 13 g/dL (ref 12.0–15.0)
MCH: 29.1 pg (ref 26.0–34.0)
MCHC: 32.4 g/dL (ref 30.0–36.0)
MCV: 89.7 fL (ref 80.0–100.0)
Platelets: 219 K/uL (ref 150–400)
RBC: 4.47 MIL/uL (ref 3.87–5.11)
RDW: 14.1 % (ref 11.5–15.5)
WBC: 12 K/uL — ABNORMAL HIGH (ref 4.0–10.5)
nRBC: 0 % (ref 0.0–0.2)

## 2024-04-17 MED ORDER — DIPHENHYDRAMINE HCL 25 MG PO CAPS: 25.0000 mg | ORAL_CAPSULE | Freq: Four times a day (QID) | ORAL | Status: DC | PRN

## 2024-04-17 MED ORDER — BUDESON-GLYCOPYRROL-FORMOTEROL 160-9-4.8 MCG/ACT IN AERO
2.0000 | INHALATION_SPRAY | Freq: Two times a day (BID) | RESPIRATORY_TRACT | Status: DC
  Administered 2024-04-17 – 2024-04-28 (×22): 2 via RESPIRATORY_TRACT
  Filled 2024-04-17: qty 5.9

## 2024-04-17 MED ORDER — PROCHLORPERAZINE 25 MG RE SUPP: 12.5000 mg | Freq: Four times a day (QID) | RECTAL | Status: DC | PRN

## 2024-04-17 MED ORDER — ACETAMINOPHEN 325 MG PO TABS: 325.0000 mg | ORAL_TABLET | ORAL | Status: DC | PRN

## 2024-04-17 MED ORDER — PANTOPRAZOLE SODIUM 40 MG PO TBEC
40.0000 mg | DELAYED_RELEASE_TABLET | Freq: Every day | ORAL | Status: DC
  Administered 2024-04-17 – 2024-04-28 (×12): 40 mg via ORAL
  Filled 2024-04-17 (×12): qty 1

## 2024-04-17 MED ORDER — GUAIFENESIN-DM 100-10 MG/5ML PO SYRP: 5.0000 mL | ORAL_SOLUTION | Freq: Four times a day (QID) | ORAL | Status: DC | PRN

## 2024-04-17 MED ORDER — PROCHLORPERAZINE MALEATE 5 MG PO TABS: 5.0000 mg | ORAL_TABLET | Freq: Four times a day (QID) | ORAL | Status: DC | PRN

## 2024-04-17 MED ORDER — ALUM & MAG HYDROXIDE-SIMETH 200-200-20 MG/5ML PO SUSP: 30.0000 mL | ORAL | Status: DC | PRN

## 2024-04-17 MED ORDER — LEFLUNOMIDE 10 MG PO TABS
20.0000 mg | ORAL_TABLET | Freq: Every day | ORAL | Status: DC
  Administered 2024-04-18 – 2024-04-28 (×11): 20 mg via ORAL
  Filled 2024-04-17 (×12): qty 2

## 2024-04-17 MED ORDER — ORAL CARE MOUTH RINSE: 15.0000 mL | OROMUCOSAL | Status: DC | PRN

## 2024-04-17 MED ORDER — CIPROFLOXACIN HCL 500 MG PO TABS
500.0000 mg | ORAL_TABLET | Freq: Two times a day (BID) | ORAL | Status: DC
  Filled 2024-04-17 (×2): qty 1

## 2024-04-17 MED ORDER — ASPIRIN 81 MG PO TBEC
81.0000 mg | DELAYED_RELEASE_TABLET | Freq: Every day | ORAL | Status: DC
  Administered 2024-04-18 – 2024-04-28 (×11): 81 mg via ORAL
  Filled 2024-04-17 (×11): qty 1

## 2024-04-17 MED ORDER — LORAZEPAM 0.5 MG PO TABS
0.5000 mg | ORAL_TABLET | ORAL | Status: DC | PRN
  Administered 2024-04-19: 0.5 mg via ORAL
  Filled 2024-04-17: qty 1

## 2024-04-17 MED ORDER — ENOXAPARIN SODIUM 40 MG/0.4ML IJ SOSY
40.0000 mg | PREFILLED_SYRINGE | INTRAMUSCULAR | Status: DC
  Administered 2024-04-17 – 2024-04-27 (×11): 40 mg via SUBCUTANEOUS
  Filled 2024-04-17 (×11): qty 0.4

## 2024-04-17 MED ORDER — METRONIDAZOLE 500 MG PO TABS
500.0000 mg | ORAL_TABLET | Freq: Three times a day (TID) | ORAL | Status: DC
  Administered 2024-04-17 – 2024-04-18 (×2): 500 mg via ORAL
  Filled 2024-04-17 (×2): qty 1

## 2024-04-17 MED ORDER — BISACODYL 10 MG RE SUPP: 10.0000 mg | Freq: Every day | RECTAL | Status: DC | PRN

## 2024-04-17 MED ORDER — VITAMIN B-12 1000 MCG PO TABS
1000.0000 ug | ORAL_TABLET | Freq: Every day | ORAL | Status: DC
  Administered 2024-04-17 – 2024-04-28 (×12): 1000 ug via ORAL
  Filled 2024-04-17 (×12): qty 1

## 2024-04-17 MED ORDER — METRONIDAZOLE 500 MG PO TABS
500.0000 mg | ORAL_TABLET | Freq: Three times a day (TID) | ORAL | Status: DC
  Administered 2024-04-17: 500 mg via ORAL
  Filled 2024-04-17: qty 1

## 2024-04-17 MED ORDER — CLOPIDOGREL BISULFATE 75 MG PO TABS
75.0000 mg | ORAL_TABLET | Freq: Every day | ORAL | Status: DC
  Administered 2024-04-18 – 2024-04-28 (×11): 75 mg via ORAL
  Filled 2024-04-17 (×11): qty 1

## 2024-04-17 MED ORDER — TRAZODONE HCL 50 MG PO TABS
25.0000 mg | ORAL_TABLET | Freq: Every day | ORAL | Status: DC
  Administered 2024-04-17 – 2024-04-27 (×11): 25 mg via ORAL
  Filled 2024-04-17 (×11): qty 1

## 2024-04-17 MED ORDER — MELATONIN 5 MG PO TABS: 5.0000 mg | ORAL_TABLET | Freq: Every evening | ORAL | Status: DC | PRN

## 2024-04-17 MED ORDER — CIPROFLOXACIN HCL 500 MG PO TABS
500.0000 mg | ORAL_TABLET | Freq: Two times a day (BID) | ORAL | Status: AC
  Administered 2024-04-17 – 2024-04-24 (×14): 500 mg via ORAL
  Filled 2024-04-17 (×16): qty 1

## 2024-04-17 MED ORDER — PROCHLORPERAZINE EDISYLATE 10 MG/2ML IJ SOLN: 5.0000 mg | Freq: Four times a day (QID) | INTRAMUSCULAR | Status: DC | PRN

## 2024-04-17 MED ORDER — ROSUVASTATIN CALCIUM 20 MG PO TABS
20.0000 mg | ORAL_TABLET | Freq: Every day | ORAL | Status: DC
  Administered 2024-04-18 – 2024-04-28 (×11): 20 mg via ORAL
  Filled 2024-04-17 (×11): qty 1

## 2024-04-17 MED ORDER — AMLODIPINE BESYLATE 10 MG PO TABS
10.0000 mg | ORAL_TABLET | Freq: Every day | ORAL | Status: DC
  Administered 2024-04-18 – 2024-04-25 (×8): 10 mg via ORAL
  Filled 2024-04-17 (×8): qty 1

## 2024-04-17 MED ORDER — ALBUTEROL SULFATE (2.5 MG/3ML) 0.083% IN NEBU: 2.5000 mg | INHALATION_SOLUTION | RESPIRATORY_TRACT | Status: DC | PRN

## 2024-04-17 MED ORDER — FLEET ENEMA RE ENEM: 1.0000 | ENEMA | Freq: Once | RECTAL | Status: DC | PRN

## 2024-04-17 NOTE — Progress Notes (Signed)
 Signed     Expand All Collapse All PMR Admission Coordinator Pre-Admission Assessment   Patient: Betty Golden is an 78 y.o., female MRN: [id number] DOB: 07-31-1945 Height: 5' 3 (160 cm) Weight: 71 kg   Insurance Information HMO:     PPO: yes     PCP:      IPA:      80/20:      OTHER:  PRIMARY: BCBS Medicare     Policy#:  [id number]      Subscriber:  CM Name: TBD      Phone#: [phone]     Fax#: [phone] Pre-Cert#: [id number]  Received insurance authorization via [place] . Pt approved from 04/15/24-04/19/24. Review date is 04/19/24.     Employer:  Benefits:  Phone #:      Name:  Eff Date: 07/29/2014 - 12/31/999 Deductible: $500($500 met) OOP Max: $3,000 ($3,000 met)  CIR: 100% coverage SNF: 100% coverage, limited to 100 days/cal yr (100 remaining) Outpatient: 100% coverage Home Health: 100% coverage Providers: in Chief Operating Officer Counselor:       Phone#:    The Engineer, materials Information Summary" for patients in Inpatient Rehabilitation Facilities with attached "Privacy Act Statement-Health Care Records" was provided and verbally reviewed with: Patient   Emergency Contact Information Contact Information       Name Relation Home Work Mobile    Betty Golden,Betty Golden Daughter     [phone]    Betty Golden Betty Golden [phone]   [phone]    Betty Golden Betty Golden [phone]   [phone]         Other Contacts   None on File        Current Medical History  Patient Admitting Diagnosis: CVA History of Present Illness: Betty Golden is a 78 year old female with past medical history of asthma, headaches, chronic neck pain, CAD, depression, hypertension, fibromyalgia, GERD, IBS, hyperlipidemia, rheumatoid arthritis, COPD who was admitted to [facility] after she was found to have generalized weakness, dizziness several falls and confusion.  Treated for acute metabolic encephalopathy, mentation appears to be  gradually improved.  Patient was noted to not be taking her meds for about 2 days.  Patient was diagnosed with sigmoid diverticulitis and was started on antibiotics.  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  She has not been able to tolerate MRI of the brain.  Neurology recommending aspirin  and Plavix  75 mg for secondary stroke prevention.  Seen by PT/OT and they recommend CIR to assist return to PLOF.  Complete NIHSS TOTAL: 5   Patient's medical record from [facility]  has been reviewed by the rehabilitation admission coordinator and physician.   Past Medical History      Past Medical History:  Diagnosis Date   Asthma     C. difficile colitis     Cervical stenosis of spine     Chronic headaches     Chronic neck pain     Coronary artery disease     Depression     Diverticulosis     Essential hypertension     Fibromyalgia     GERD (gastroesophageal reflux disease)     History of Salmonella gastroenteritis     HNP (herniated nucleus pulposus), lumbar     Hyperlipidemia     IBS (irritable bowel syndrome)     Lung nodule     rheumatoid arthritis  Has the patient had major surgery during 100 days prior to admission? No   Family History   family history includes Asthma in her brother, maternal aunt, maternal grandmother, mother, and sister; Diabetes in her maternal grandfather; Heart disease in her father, maternal grandmother, and mother; Hypertension in her brother, father, and mother; Stroke in her father and sister.   Current Medications  Current Medications    Current Facility-Administered Medications:    acetaminophen  (TYLENOL ) tablet 650 mg, 650 mg, Oral, Q6H PRN, 650 mg at 04/16/24 0224 **OR** acetaminophen  (TYLENOL ) suppository 650 mg, 650 mg, Rectal, Q6H PRN, Emokpae, Ejiroghene E, MD   amLODipine  (NORVASC ) tablet 10 mg, 10 mg, Oral,  Daily, Shahmehdi, Seyed A, MD, 10 mg at 04/16/24 1001   aspirin  EC tablet 81 mg, 81 mg, Oral, Daily, Bryn Motto B, MD, 81 mg at 04/16/24 1001   budesonide -glycopyrrolate -formoterol  (BREZTRI ) 160-9-4.8 MCG/ACT inhaler 2 puff, 2 puff, Inhalation, BID, Emokpae, Ejiroghene E, MD, 2 puff at 04/17/24 0748   ciprofloxacin  (CIPRO ) tablet 500 mg, 500 mg, Oral, BID, Shahmehdi, Seyed A, MD, 500 mg at 04/16/24 2100   clopidogrel  (PLAVIX ) tablet 75 mg, 75 mg, Oral, Daily, Bryn Motto B, MD, 75 mg at 04/16/24 1001   enoxaparin  (LOVENOX ) injection 40 mg, 40 mg, Subcutaneous, Q24H, Emokpae, Ejiroghene E, MD, 40 mg at 04/16/24 2105   haloperidol  lactate (HALDOL ) injection 1 mg, 1 mg, Intravenous, Once, Francesca Elsie CROME, MD   haloperidol  lactate (HALDOL ) injection 1-2 mg, 1-2 mg, Intramuscular, Q6H PRN, Mansy, Jan A, MD, 2 mg at 04/15/24 0234   leflunomide  (ARAVA ) tablet 20 mg, 20 mg, Oral, Daily, Emokpae, Ejiroghene E, MD, 20 mg at 04/16/24 1002   LORazepam  (ATIVAN ) tablet 0.5 mg, 0.5 mg, Oral, Q4H PRN, Mansy, Jan A, MD, 0.5 mg at 04/16/24 2100   metroNIDAZOLE  (FLAGYL ) tablet 500 mg, 500 mg, Oral, Q8H, Shahmehdi, Seyed A, MD, 500 mg at 04/17/24 0505   ondansetron  (ZOFRAN ) tablet 4 mg, 4 mg, Oral, Q6H PRN **OR** ondansetron  (ZOFRAN ) injection 4 mg, 4 mg, Intravenous, Q6H PRN, Emokpae, Ejiroghene E, MD   Oral care mouth rinse, 15 mL, Mouth Rinse, PRN, Bryn Motto B, MD   rosuvastatin  (CRESTOR ) tablet 20 mg, 20 mg, Oral, Daily, Shahmehdi, Seyed A, MD, 20 mg at 04/16/24 1001   traZODone  (DESYREL ) tablet 25 mg, 25 mg, Oral, QHS, Shahmehdi, Seyed A, MD, 25 mg at 04/16/24 2100     Patients Current Diet:  Diet Order                  Diet - low sodium heart healthy             Diet Heart Room service appropriate? Yes; Fluid consistency: Thin  Diet effective now                         Precautions / Restrictions Precautions Precautions: Fall Restrictions Weight Bearing Restrictions Per Provider Order:  No    Has the patient had 2 or more falls or a fall with injury in the past year? Yes   Prior Activity Level Community (5-7x/wk): Pt.  active in the community PTA   Prior Functional Level Self Care: Did the patient need help bathing, dressing, using the toilet or eating? Independent   Indoor Mobility: Did the patient need assistance with walking from room to room (with or without device)? Independent   Stairs: Did the patient need assistance with internal or external stairs (with or without device)? Independent  Functional Cognition: Did the patient need help planning regular tasks such as shopping or remembering to take medications? Independent   Patient Information Are you of Hispanic, Latino/a,or Spanish origin?: A. No, not of Hispanic, Latino/a, or Spanish origin What is your race?: Z. None of the above Do you need or want an interpreter to communicate with a doctor or health care staff?: 0. No Patient information obtained via proxy : yes   Patient's Response To:  Health Literacy and Transportation Is the patient able to respond to health literacy and transportation needs?: No Health Literacy - How often do you need to have someone help you when you read instructions, pamphlets, or other written material from your doctor or pharmacy?: Patient unable to respond In the past 12 months, has lack of transportation kept you from medical appointments or from getting medications?: No In the past 12 months, has lack of transportation kept you from meetings, work, or from getting things needed for daily living?: No Higher education careers adviser obtained via proxy: tes   Journalist, newspaper / Equipment Home Equipment: Agricultural consultant (2 wheels), Wheelchair - manual, Medical laboratory scientific officer - single point, BSC/3in1   Prior Device Use: Indicate devices/aids used by the patient prior to current illness, exacerbation or injury? Walker   Current Functional Level Cognition   Overall Cognitive Status:  Difficult to assess Orientation Level: Oriented to person, Oriented to place    Extremity Assessment (includes Sensation/Coordination)   Upper Extremity Assessment: LUE deficits/detail LUE Deficits / Details: 4/5 grossly other than grasp which was 4+/5; deficits in FM and GM coordination. Difficult to accurately gauge sensation given pt's cognition. LUE Sensation:  (continue to assess.) LUE Coordination: decreased fine motor, decreased gross motor  Lower Extremity Assessment: Defer to PT evaluation     ADLs   Overall ADL's : Needs assistance/impaired Eating/Feeding: Set up, Sitting Eating/Feeding Details (indicate cue type and reason): finishing snack and drink at beginning of session Grooming: Set up Grooming Details (indicate cue type and reason): Provided washcloth, able to wash face and hands with no assist. Upper Body Bathing: Contact guard assist, Sitting, Supervision/ safety Lower Body Bathing: Supervison/ safety, Contact guard assist, Sitting/lateral leans Upper Body Dressing : Supervision/safety, Contact guard assist, Sitting Lower Body Dressing: Supervision/safety, Contact guard assist, Sitting/lateral leans Lower Body Dressing Details (indicate cue type and reason): Able to doff and don L sock seated in the chair. Toilet Transfer: Moderate assistance, Minimal assistance, Ambulation, Rolling walker (2 wheels) Toilet Transfer Details (indicate cue type and reason): increased time and effort, initially mod A, improving to min A with slight R sided lean Toileting- Clothing Manipulation and Hygiene: Contact guard assist, Minimal assistance, Sitting/lateral lean Toileting - Clothing Manipulation Details (indicate cue type and reason): Needs assist with clothing management Tub/ Shower Transfer: Moderate assistance, Ambulation, Rolling walker (2 wheels) Functional mobility during ADLs: Moderate assistance, Rolling walker (2 wheels) General ADL Comments: Pt appears to be improving with  mobility and cognition.     Mobility   Overal bed mobility: Needs Assistance Bed Mobility: Supine to Sit, Sit to Supine Supine to sit: Supervision Sit to supine: Used rails, Min assist General bed mobility comments: Up in recliner on entry     Transfers   Overall transfer level: Needs assistance Equipment used: Rolling walker (2 wheels) Transfers: Sit to/from Stand, Bed to chair/wheelchair/BSC Sit to Stand: Mod assist Bed to/from chair/wheelchair/BSC transfer type:: Step pivot Step pivot transfers: Mod assist, Min assist General transfer comment: increased effort and time, improving to min  with prolonged standing     Ambulation / Gait / Stairs / Wheelchair Mobility   Ambulation/Gait Ambulation/Gait assistance: Mod assist Gait Distance (Feet): 10 Feet Assistive device: Rolling walker (2 wheels) Gait Pattern/deviations: Step-through pattern, Decreased stride length, Knees buckling General Gait Details: Pt experienced difficulty maintaining knee extension in stance phase on the LLE Gait velocity: decreased     Posture / Balance Dynamic Sitting Balance Sitting balance - Comments: seated in chair Balance Overall balance assessment: Needs assistance Sitting-balance support: Feet supported, No upper extremity supported Sitting balance-Leahy Scale: Good Sitting balance - Comments: seated in chair Standing balance support: Reliant on assistive device for balance, During functional activity Standing balance-Leahy Scale: Poor Standing balance comment: with RW; very poor with SPC     Special considerations/life events  Skin Blister: labia/bilateral; Ecchymosis: arm/bilateral; Erythema/Redness: groin/bilateral     Previous Home Environment (from acute therapy documentation) Living Arrangements: Children  Lives With: Family Available Help at Discharge: Family, Available 24 hours/day Type of Home: House Home Layout: Two level, Able to live on main level with bedroom/bathroom, Full bath  on main level Home Access: Ramped entrance Bathroom Shower/Tub: Health visitor: Standard Bathroom Accessibility: Yes Home Care Services: No Additional Comments: per chart; pt is a poor historian   Discharge Living Setting Plans for Discharge Living Setting: House Type of Home at Discharge: House Discharge Home Layout: Multi-level Alternate Level Stairs-Rails: Right Alternate Level Stairs-Number of Steps: 5 Discharge Home Access: Stairs to enter Entrance Stairs-Rails: Right Entrance Stairs-Number of Steps: 4 Discharge Bathroom Shower/Tub: Walk-in shower Discharge Bathroom Toilet: Standard Discharge Bathroom Accessibility: Yes How Accessible: Accessible via walker, Accessible via wheelchair Does the patient have any problems obtaining your medications?: No   Social/Family/Support Systems Patient Roles: Other (Comment) Contact Information: [phone] Anticipated Caregiver: [name] Hugger (daughter) Caregiver Availability: 24/7 Discharge Plan Discussed with Primary Caregiver: Yes Is Caregiver In Agreement with Plan?: Yes Does Caregiver/Family have Issues with Lodging/Transportation while Pt is in Rehab?: No   Goals Patient/Family Goal for Rehab: PT/OT/SLP supervision Expected length of stay: 10-12 days Pt/Family Agrees to Admission and willing to participate: Yes Program Orientation Provided & Reviewed with Pt/Caregiver Including Roles  & Responsibilities: Yes   Decrease burden of Care through IP rehab admission: Not anticipated   Possible need for SNF placement upon discharge: not anticipated   Patient Condition: I have reviewed medical records from [facility] , spoken with CM, and patient. I met with patient at the bedside for inpatient rehabilitation assessment.  Patient will benefit from ongoing PT, OT, and SLP, can actively participate in 3 hours of therapy a day 5 days of the week, and can make measurable gains during the admission.  Patient will  also benefit from the coordinated team approach during an Inpatient Acute Rehabilitation admission.  The patient will receive intensive therapy as well as Rehabilitation physician, nursing, social worker, and care management interventions.  Due to safety, skin/wound care, disease management, medication administration, pain management, and patient education the patient requires 24 hour a day rehabilitation nursing.  The patient is currently Mod A with mobility and CGA-Min A with basic ADLs.  Discharge setting and therapy post discharge at home with home health is anticipated.  Patient has agreed to participate in the Acute Inpatient Rehabilitation Program and will admit today.   Preadmission Screen Completed By:  Leita KATHEE Kleine, 04/17/2024 9:42 AM ______________________________________________________________________   Discussed status with Dr. Urbano on 04/17/24  at 9:42 AM and received approval for admission today.  Admission Coordinator:  Leita KATHEE Kleine, CCC-SLP, time 9:42 AM/Date 04/17/24     Assessment/Plan: Diagnosis: cva Does the need for close, 24 hr/day Medical supervision in concert with the patient's rehab needs make it unreasonable for this patient to be served in a less intensive setting? Yes Co-Morbidities requiring supervision/potential complications: encephalopathy, diverticulitis, Hypokalemia, RA, HTN, COPD Due to bladder management, bowel management, safety, skin/wound care, disease management, medication administration, pain management, and patient education, does the patient require 24 hr/day rehab nursing? Yes Does the patient require coordinated care of a physician, rehab nurse, PT, OT, and SLP to address physical and functional deficits in the context of the above medical diagnosis(es)? Yes Addressing deficits in the following areas: balance, endurance, locomotion, strength, transferring, bowel/bladder control, bathing, dressing, feeding, grooming, toileting, cognition,  speech, language, swallowing, and psychosocial support Can the patient actively participate in an intensive therapy program of at least 3 hrs of therapy 5 days a week? Yes The potential for patient to make measurable gains while on inpatient rehab is excellent Anticipated functional outcomes upon discharge from inpatient rehab: supervision PT, supervision OT, supervision SLP Estimated rehab length of stay to reach the above functional goals is: 10-12 days Anticipated discharge destination: Home 10. Overall Rehab/Functional Prognosis: excellent     MD Signature: Murray Collier

## 2024-04-17 NOTE — H&P (Addendum)
 Physical Medicine and Rehabilitation Admission H&P          Chief Complaint  Patient presents with   Functional deficits due to stroke      HPI: Betty Golden is a 78 year old female with history of COPD, RA, FM, cervical stenosis w/chronic pain,  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 She was found to have leucocytosis WBC 18.7, hypokalemia K-2.6 and was moaning with abdominal tenderness at admission. CT head  showed suspicion of recent right parietal infarct. CT A/P showed mild colonic diverticulitis with descending and sigmoid wall thickening question colitis. She was started on IV antibiotics and treated with fluid bolus. MRI attempted but patient had panic attack so aborted. 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    Dr. Voncile consulted and recommended DAPT for left parietal stroke due to occluded L-MCA distribution. MRI recommended for work up but patient declined repeat attempt. She was noted to have mild left sided weakness with aphasia and therapy consulted.  Neurology recommending ASA 81 mg and Plavix  75 mg daily for 3 months followed by aspirin  alone.  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 She was independent PTA and CIR recommended due to functional decline.       Review of Systems  Constitutional:  Negative for chills and fever.  HENT:  Negative for hearing loss.   Eyes:  Negative for blurred vision and double vision.  Respiratory:  Negative for cough and shortness  of breath.   Cardiovascular:  Negative for chest pain.  Gastrointestinal:  Negative for abdominal pain, constipation, diarrhea, nausea and vomiting.  Genitourinary: Negative.   Musculoskeletal:  Negative for back pain, joint pain and neck pain.  Neurological:  Positive for sensory change, speech change and weakness. Negative for dizziness and headaches.  Psychiatric/Behavioral:  Negative for suicidal ideas. The patient has insomnia.                   Past Medical History:  Diagnosis Date   Asthma     C. difficile colitis     Cervical stenosis of spine     Chronic headaches     Chronic neck pain     Coronary artery disease     Depression     Diverticulosis     Essential hypertension     Fibromyalgia     GERD (gastroesophageal reflux disease)     History of Salmonella gastroenteritis     HNP (herniated nucleus pulposus), lumbar     Hyperlipidemia     IBS (irritable bowel syndrome)     Lung nodule     rheumatoid arthritis                          Past Surgical History:  Procedure Laterality Date   APPENDECTOMY   1965   BIOPSY   08/26/2018    Procedure: BIOPSY;  Surgeon: Golda Claudis PENNER, MD;  Location: [facility];  Service: Endoscopy;;  gastric  BREAST BIOPSY       BREAST REDUCTION SURGERY   1994   CATARACT EXTRACTION W/PHACO Left 06/09/2023    Procedure: CATARACT EXTRACTION PHACO AND INTRAOCULAR LENS PLACEMENT (IOC);  Surgeon: Harrie Agent, MD;  Location: [facility];  Service: Ophthalmology;  Laterality: Left;  CDE 7.03   CATARACT EXTRACTION W/PHACO Right 06/23/2023    Procedure: CATARACT EXTRACTION PHACO AND INTRAOCULAR LENS PLACEMENT (IOC);  Surgeon: Harrie Agent, MD;  Location: [facility];  Service: Ophthalmology;  Laterality: Right;  CDE: 11.55   CHOLECYSTECTOMY   1975   COLONOSCOPY N/A 08/19/2013    Procedure: COLONOSCOPY;  Surgeon: Claudis RAYMOND Rivet, MD;  Location: [facility];  Service: Endoscopy;  Laterality: N/A;  155-moved to 140 Ann to notify pt   COLONOSCOPY  N/A 08/26/2018    Procedure: COLONOSCOPY;  Surgeon: Rivet Claudis RAYMOND, MD;  Location: [facility];  Service: Endoscopy;  Laterality: N/A;   ESOPHAGOGASTRODUODENOSCOPY N/A 08/26/2018    Procedure: ESOPHAGOGASTRODUODENOSCOPY (EGD);  Surgeon: Rivet Claudis RAYMOND, MD;  Location: [facility];  Service: Endoscopy;  Laterality: N/A;   fibromyalgia       LUMBAR LAMINECTOMY/DECOMPRESSION MICRODISCECTOMY Bilateral 09/20/2015    Procedure: MICRO LUMBAR DECOMPRESSION L5-S1 BILATERAL    (1 LEVEL);  Surgeon: Reyes Billing, MD;  Location: [facility];  Service: Orthopedics;  Laterality: Bilateral;   rheumatoid arthritis       Sinus Surgery       TONSILLECTOMY   1968                        Family History  Problem Relation Age of Onset   Heart disease Mother     Hypertension Mother     Asthma Mother     Heart disease Father     Stroke Father     Hypertension Father     Stroke Sister     Asthma Sister     Hypertension Brother     Asthma Brother     Asthma Maternal Grandmother     Heart disease Maternal Grandmother     Diabetes Maternal Grandfather     Asthma Maternal Aunt     Colon cancer Neg Hx              Social History:  reports that she quit smoking about 35 years ago. Her smoking use included cigarettes. She started smoking about 45 years ago. She has a 20 pack-year smoking history. She has never used smokeless tobacco. She reports that she does not drink alcohol and does not use drugs.     Allergies         Allergies  Allergen Reactions   Hydromorphone  Anaphylaxis   Sulfa Antibiotics Rash                         Facility-Administered Medications Prior to Admission  Medication Dose Route Frequency Provider Last Rate Last Admin   Benralizumab  SOSY 30 mg  30 mg Subcutaneous Q8 Weeks Gallagher, Joel Louis, MD   30 mg at 02/09/24 9082                 Medications Prior to Admission  Medication Sig Dispense Refill   albuterol  (PROAIR  HFA) 108 (90 Base) MCG/ACT inhaler Inhale 2 puffs  into the lungs every 4 (four) hours as needed for wheezing or shortness of breath. 18 g 3   amLODipine  (NORVASC ) 10 MG tablet Take 10 mg by mouth daily.  atorvastatin  (LIPITOR) 10 MG tablet Take 10 mg by mouth daily.       benralizumab  (FASENRA ) 30 MG/ML prefilled syringe Inject 1 mL (30 mg total) into the skin every 8 (eight) weeks. 1 mL 6   CALCIUM  PO Take 1,250 mg by mouth.       [EXPIRED] cephALEXin (KEFLEX) 500 MG capsule Take 1,000 mg by mouth 2 (two) times daily.       Certolizumab Pegol (CIMZIA [place]) Inject 1 Dose into the skin every 14 (fourteen) days.       Cetirizine HCl (ZYRTEC PO) Take 10 mg by mouth daily at 6 (six) AM.       clindamycin (CLEOCIN T) 1 % lotion Apply 1 Application topically daily.       colestipol  (COLESTID ) 1 g tablet Take 2 g by mouth daily. Take 4 hours before or after other medications.       DULoxetine  (CYMBALTA ) 30 MG capsule Take 30 mg by mouth 2 (two) times daily.       Ferrous Sulfate (IRON PO) Take 65 mg by mouth daily.       fluticasone  (FLONASE ) 50 MCG/ACT nasal spray SPRAY 2 SPRAYS INTO EACH NOSTRIL EVERY DAY 48 mL 1   Fluticasone -Umeclidin-Vilant (TRELEGY ELLIPTA ) 200-62.5-25 MCG/ACT AEPB Inhale 1 puff into the lungs in the morning. 90 each 3   leflunomide  (ARAVA ) 20 MG tablet Take 20 mg by mouth daily.       losartan -hydrochlorothiazide  (HYZAAR) 100-12.5 MG tablet Take 1 tablet by mouth daily.       MAGNESIUM  PO Take 400 mg by mouth daily at 6 (six) AM.       Multiple Vitamin (MULTIVITAMIN PO) Take 1 tablet by mouth daily at 6 (six) AM.       pantoprazole  (PROTONIX ) 40 MG tablet Take 1 tablet (40 mg total) by mouth daily. 30 tablet 0   potassium chloride  SA (KLOR-CON  M) 20 MEQ tablet Take 1 tablet (20 mEq total) by mouth daily. 6 tablet 0   pregabalin  (LYRICA ) 50 MG capsule Take 50 mg by mouth 2 (two) times daily.       RESTASIS  0.05 % ophthalmic emulsion Place 1 drop into both eyes 2 (two) times daily.       vitamin B-12 (CYANOCOBALAMIN ) 1000 MCG  tablet Take 1 tablet (1,000 mcg total) by mouth daily. 30 tablet 3            Home: Home Living Family/patient expects to be discharged to:: Private residence Living Arrangements: Children Available Help at Discharge: Family, Available 24 hours/day Type of Home: House Home Access: Ramped entrance Home Layout: Two level, Able to live on main level with bedroom/bathroom, Full bath on main level Bathroom Shower/Tub: Health visitor: Standard Bathroom Accessibility: Yes Home Equipment: Agricultural consultant (2 wheels), Wheelchair - manual, [employer] - single point, BSC/3in1 Additional Comments: per chart; pt is a poor historian  Lives With: Family   Functional History: Prior Function Prior Level of Function : Patient poor historian/Family not available, Needs assist Physical Assist : ADLs (physical) Mobility (physical): Bed mobility, Transfers, Gait Mobility Comments: household ambulator with no AD, community ambulator with SPC (per chart) ADLs Comments: assited by family (per chart; pt reported assist but also independence; unsure of true baseline ADL function.)   Functional Status:  Mobility: Bed Mobility Overal bed mobility: Needs Assistance Bed Mobility: Supine to Sit, Sit to Supine Supine to sit: Supervision Sit to supine: Used rails, Min assist General bed mobility comments: Up in recliner  on entry Transfers Overall transfer level: Needs assistance Equipment used: Rolling walker (2 wheels) Transfers: Sit to/from Stand, Bed to chair/wheelchair/BSC Sit to Stand: Mod assist Bed to/from chair/wheelchair/BSC transfer type:: Step pivot Step pivot transfers: Mod assist, Min assist General transfer comment: increased effort and time, improving to min with prolonged standing Ambulation/Gait Ambulation/Gait assistance: Mod assist Gait Distance (Feet): 10 Feet Assistive device: Rolling walker (2 wheels) Gait Pattern/deviations: Step-through pattern, Decreased stride length,  Knees buckling General Gait Details: Pt experienced difficulty maintaining knee extension in stance phase on the LLE Gait velocity: decreased   ADL: ADL Overall ADL's : Needs assistance/impaired Eating/Feeding: Set up, Sitting Eating/Feeding Details (indicate cue type and reason): finishing snack and drink at beginning of session Grooming: Set up Grooming Details (indicate cue type and reason): Provided washcloth, able to wash face and hands with no assist. Upper Body Bathing: Contact guard assist, Sitting, Supervision/ safety Lower Body Bathing: Supervison/ safety, Contact guard assist, Sitting/lateral leans Upper Body Dressing : Supervision/safety, Contact guard assist, Sitting Lower Body Dressing: Supervision/safety, Contact guard assist, Sitting/lateral leans Lower Body Dressing Details (indicate cue type and reason): Able to doff and don L sock seated in the chair. Toilet Transfer: Moderate assistance, Minimal assistance, Ambulation, Rolling walker (2 wheels) Toilet Transfer Details (indicate cue type and reason): increased time and effort, initially mod A, improving to min A with slight R sided lean Toileting- Clothing Manipulation and Hygiene: Contact guard assist, Minimal assistance, Sitting/lateral lean Toileting - Clothing Manipulation Details (indicate cue type and reason): Needs assist with clothing management Tub/ Shower Transfer: Moderate assistance, Ambulation, Rolling walker (2 wheels) Functional mobility during ADLs: Moderate assistance, Rolling walker (2 wheels) General ADL Comments: Pt appears to be improving with mobility and cognition.   Cognition: Cognition Overall Cognitive Status: Difficult to assess Orientation Level: Oriented to person, Oriented to place Cognition Arousal: Alert Behavior During Therapy: [facility] for tasks assessed/performed Overall Cognitive Status: Difficult to assess     Blood pressure (!) 148/79, pulse (!) 104, temperature 98.7 F (37.1 C),  temperature source Oral, resp. rate 18, height 5' 3 (1.6 m), weight 71 kg, SpO2 97%. Physical Exam   General: No apparent distress HEENT: Head is normocephalic, atraumatic, sclera anicteric, oral mucosa pink and moist Heart: Reg rate and rhythm. No murmurs rubs or gallops Chest: CTA bilaterally without wheezes, rales, or rhonchi; no distress Abdomen: Soft, non-tender, non-distended, bowel sounds positive. Extremities: 1+ LE edema Psych: Pt is cooperative, frustrated with speech deficits but overall very pleasant Skin: Clean and intact without signs of breakdown on visible portion , bruising on her arms Neuro:     Mental Status: AAOx to day, for month before October, for year able to get with choices, unable to say her name   Speech/Languate: Aphasia more expressive than receptive, able to name 2/3 items, cannot repeat, paraphasias, able to follow intermittently simple commands, occasionally requiring some cueing CRANIAL NERVES: II: PERRL.  III, IV, VI: EOM -appears to be intact although required several attempts due to communication to get her to follow V: normal sensation bilaterally VII: slight R facial weakness, mild right eye ptosis VIII: normal hearing to speech IX, X: normal palatal elevation XI: Head turn appears symmetrical XII: Tongue midline     MOTOR: RUE: 4/5 Deltoid, 4/5 Biceps, 4/5 Triceps,4/5 Grip LUE: 4/5 Deltoid, 4/5 Biceps, 4/5 Triceps, 4/5 Grip RLE: HF 4/5, KE 4/5, ADF 4/5, APF 5/5 LLE: HF 4-/5, KE 4-/5, ADF 4/5, APF 5/5     REFLEXES: No ankle clonus  SENSORY: Normal to touch all 4 extremities   Coordination: Normal finger to nose bilaterally       Lab Results Last 48 Hours             Results for orders placed or performed during the hospital encounter of 04/12/24 (from the past 48 hours)  CBC     Status: Abnormal    Collection Time: 04/16/24  3:55 AM  Result Value Ref Range    WBC 14.5 (H) 4.0 - 10.5 K/uL    RBC 4.33 3.87 - 5.11 MIL/uL     Hemoglobin 12.3 12.0 - 15.0 g/dL    HCT 60.9 63.9 - 53.9 %    MCV 90.1 80.0 - 100.0 fL    MCH 28.4 26.0 - 34.0 pg    MCHC 31.5 30.0 - 36.0 g/dL    RDW 85.8 88.4 - 84.4 %    Platelets 223 150 - 400 K/uL    nRBC 0.0 0.0 - 0.2 %      Comment: Performed at [facility], [address], [place], [place] [address]  Basic metabolic panel with GFR     Status: Abnormal    Collection Time: 04/16/24  3:55 AM  Result Value Ref Range    Sodium 139 135 - 145 mmol/L    Potassium 3.6 3.5 - 5.1 mmol/L    Chloride 110 98 - 111 mmol/L    CO2 21 (L) 22 - 32 mmol/L    Glucose, Bld 101 (H) 70 - 99 mg/dL      Comment: Glucose reference range applies only to samples taken after fasting for at least 8 hours.    BUN 11 8 - 23 mg/dL    Creatinine, Ser 8.77 (H) 0.44 - 1.00 mg/dL    Calcium  9.6 8.9 - 10.3 mg/dL    GFR, Estimated 46 (L) >60 mL/min      Comment: (NOTE) Calculated using the CKD-EPI Creatinine Equation (2021)      Anion gap 8 5 - 15      Comment: Performed at [facility], [address] [place], [place] [address]  CBC     Status: Abnormal    Collection Time: 04/17/24  5:26 AM  Result Value Ref Range    WBC 12.0 (H) 4.0 - 10.5 K/uL    RBC 4.47 3.87 - 5.11 MIL/uL    Hemoglobin 13.0 12.0 - 15.0 g/dL    HCT 59.8 63.9 - 53.9 %    MCV 89.7 80.0 - 100.0 fL    MCH 29.1 26.0 - 34.0 pg    MCHC 32.4 30.0 - 36.0 g/dL    RDW 85.8 88.4 - 84.4 %    Platelets 219 150 - 400 K/uL    nRBC 0.0 0.0 - 0.2 %      Comment: Performed at [facility], [address], [place], [place] [address]  Basic metabolic panel with GFR     Status: Abnormal    Collection Time: 04/17/24  5:26 AM  Result Value Ref Range    Sodium 141 135 - 145 mmol/L    Potassium 3.6 3.5 - 5.1 mmol/L    Chloride 111 98 - 111 mmol/L    CO2 19 (L) 22 - 32 mmol/L    Glucose, Bld 108 (H) 70 - 99 mg/dL      Comment: Glucose reference range applies only to samples taken after fasting for at least 8 hours.    BUN 10 8 - 23 mg/dL    Creatinine,  Ser 1.07 (H) 0.44 - 1.00 mg/dL    Calcium  9.4 8.9 - 10.3 mg/dL    GFR, Estimated 54 (L) >60 mL/min      Comment: (NOTE) Calculated using the CKD-EPI Creatinine Equation (2021)      Anion gap 11 5 - 15      Comment: Performed at [facility], [address], [place], [place] [address]      Imaging Results (Last 48 hours)  No results found.            Blood pressure (!) 148/79, pulse (!) 104, temperature 98.7 F (37.1 C), temperature source Oral, resp. rate 18, height 5' 3 (1.6 m), weight 71 kg, SpO2 97%.   Medical Problem List and Plan: 1. 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  Patient did not tolerate MRI             -patient may shower             -ELOS/Goals: 10-12 days, PT/OT/SLP supervision             -Admit to CIR 2.  Antithrombotics: -DVT/anticoagulation:  Pharmaceutical: Lovenox              -antiplatelet therapy: DAPT X 3 months followed by ASA alone.  Discharge summary and decubitus.  Discharge summary indicated aspirin  and Plavix  for 21 days in 1 location, will message hospitalist to confirm 3 months 3. Pain Management:  Tylenol  prn.  4. Mood/Behavior/Sleep: LCSW to follow for evaluation and support             --trazodone  25 mg at bedtime. Ativan  prn for anxiety/agitation due to aphasia?              -antipsychotic agents: has had haldol  prn--last used on 09/18.  -DC summary indicates cymbalta  60mg  daily, does not appear she got this at the acute hospital.  Will check with patient on this tomorrow 5. Neuropsych/cognition: This patient may be capable of making decisions on her own behalf. 6. Skin/Wound Care: Routine pressure relief measures.             -- Monitor skin integrity 7. Fluids/Electrolytes/Nutrition: Monitor I/O.  --Check CMET in am 8. Diverticulitis/Colitis: Transitioned to cipro  and Flagyl  antibiotic D#              --WBC trending down today but also had drop in H/H. Monitor for signs of bleeding.                - Follow-up with gastroenterologist outpatient 9. HTN: Monitor BP TID--avoid hypoperfusion. BP meds have been resumed.  Monitor with activity. --On amlodipine , 10. COPD: On Breztri  bid with Fasenra  every 8 weeks. 11. RA: Stable on Leflunomide .  12. Hypokalemia: Being supplemented daily but still low --Recheck in am 13. Hypomagnesemia: Recheck in am.  14.  ABLA: Monitor for rectal bleeding. Hgb down from 12.8 to 11.2. Back up to 13.0 on 9/20 15, Delirium: Resolving but needed ativan   16. Leukocytosis: 12.0 on 9/20, see #8   Sharlet GORMAN Schmitz, PA-C 04/17/2024   I have personally performed a face to face diagnostic evaluation of this patient and formulated the key components of the plan.  Additionally, I have personally reviewed laboratory data, imaging studies, as well as relevant notes and concur with the physician assistant's documentation above.   The patient's status has not changed from the original H&P.  Any changes in documentation from the acute care chart have been  noted above.   Murray Collier, MD

## 2024-04-17 NOTE — Progress Notes (Signed)
 PROGRESS NOTE    Patient: Betty Golden                            PCP: Irven Ozell DEL, MD                    DOB: [birth year]            DOA: 04/12/2024 [id number]             DOS: 04/17/2024, 9:21 AM   LOS: 4 days   Date of Service: The patient was seen and examined on 04/17/2024  Subjective:   The patient was seen and examined this morning. Hemodynamically stable. No issues overnight .  Brief Narrative:   Betty Golden is a 78 y.o. female with a history of RA, HTN, COPD who presented to the ED on 04/12/2024 when family reported she was becoming increasingly confused. Formerly independent, she began walking around the house nude, hallucinating, not eating or drinking. She was afebrile, leukocytosis, hypokalemia, head CT showed suspected recent infarct in right parietal white matter. CT abd/pelvis showed mild sigmoid diverticulitis, descending and sigmoid colitis vs. nondistention. Antibiotics given, IVF given, and patient admitted. Did not tolerate MRI. Metabolic derangements have improved, leukocytosis improving. Mental status remains abnormal. Neurology consulted.     Assessment & Plan:   Principal Problem:   Acute metabolic encephalopathy Active Problems:   RA (rheumatoid arthritis) (HCC)   Essential hypertension   COPD (chronic obstructive pulmonary disease) (HCC)     Assessment and Plan:   Acute metabolic encephalopathy -Improved mentation - Aphasic-from stroke -Improved confusion, hallucination -  aphasic difficulty with word finding and communication - Patient refused to go for MRI-despite offering sedative such as Ativan  (Unable to complete MRI previously due to coordination, cooperation of patient)    - Head CT-suggest recent infarct,  - Continue neurochecks-no new focal neurological findings - Continue aspirin , Plavix , statins -for stroke prevention Will continue aspirin  Plavix  for 21 days, then continue with monotherapy Plavix     Acute Stroke  -  - With expressive aphasia- Per neurology recommendation: Aspirin  + plavix  for 3 months,  followed by aspirin  81 mg  daily  Continue statins - LDL goal <  70 -CTA revealed :  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   Sigmoid diverticulitis -Improved leukocytosis -No other signs of infection - CTAP WC also shows mild sigmoid diverticulitis/colitis.   Currently no pyelonephritis- but UA not suggestive of UTI. -Changed IV antibiotics of ciprofloxacin /Flagyl  to p.o.   Hypokalemia - Continue to monitor and replete monitoring magnesium  level     -Echo:EJF  70 to 75%. The  left ventricle has hyperdynamic function. Left ventricular endocardial  border not optimally defined to evaluate regional wall motion. Left  ventricular diastolic parameters are  consistent with Grade I diastolic dysfunction (impaired relaxation).  Elevated left atrial pressure.   2. Right ventricular systolic function is normal.      Acute sigmoid diverticulitis:  Presenting with abdominal pain, leukocytosis of 18.7 >>> 13.2 >>> 11.3 - Sepsis was ruled out -  CT AP WC- Mild sigmoid colonic diverticulitis. No perforation or abscess. Descending and sigmoid colonic wall thickening versus nondistention, can be seen with colitis. - Will continue  IV ceftriaxone  and metronidazole --- switch to p.o.     RA: Stable no changes Reports generalized body aches.  But no obvious swelling of any joints.  Has nodules. She is on leflunomide  and satralizumab injections q14days. - Resume leflunomide    Essential hypertension -BP meds were held to allow for permissive hypertension - Will resume Norvasc  Norvasc , added metoprolol  discontinued lisinopril/HCTZ     COPD: Quiescent Stable.  She is on Fasenra  injections every 8 weeks. - Resume home regimen.     Ethics: Full  code        ------------------------------------------------------------------------------------------------------------------------------------------------  DVT prophylaxis:  enoxaparin  (LOVENOX ) injection 40 mg Start: 04/12/24 2200   Code Status:   Code Status: Full Code  Family Communication: No family member present at bedside-  -Advance care planning has been discussed.   Disposition: From  - home             Stable to discharge to CIR today  Procedures:   No admission procedures for hospital encounter.   Antimicrobials:  Anti-infectives (From admission, onward)    Start     Dose/Rate Route Frequency Ordered Stop   04/15/24 1000  ciprofloxacin  (CIPRO ) tablet 500 mg        500 mg Oral 2 times daily 04/15/24 0802     04/15/24 1000  metroNIDAZOLE  (FLAGYL ) tablet 500 mg        500 mg Oral Every 8 hours 04/15/24 0802     04/15/24 0000  ciprofloxacin  (CIPRO ) 500 MG tablet        500 mg Oral 2 times daily 04/15/24 1244 04/22/24 2359   04/15/24 0000  metroNIDAZOLE  (FLAGYL ) 500 MG tablet        500 mg Oral Every 8 hours 04/15/24 1244 04/22/24 2359   04/13/24 1700  cefTRIAXone  (ROCEPHIN ) 2 g in sodium chloride  0.9 % 100 mL IVPB  Status:  Discontinued        2 g 200 mL/hr over 30 Minutes Intravenous Every 24 hours 04/12/24 2035 04/15/24 0802   04/13/24 0500  metroNIDAZOLE  (FLAGYL ) IVPB 500 mg  Status:  Discontinued        500 mg 100 mL/hr over 60 Minutes Intravenous Every 12 hours 04/12/24 2035 04/15/24 0802   04/12/24 1715  cefTRIAXone  (ROCEPHIN ) 2 g in sodium chloride  0.9 % 100 mL IVPB        2 g 200 mL/hr over 30 Minutes Intravenous  Once 04/12/24 1714 04/12/24 1814   04/12/24 1715  metroNIDAZOLE  (FLAGYL ) IVPB 500 mg        500 mg 100 mL/hr over 60 Minutes Intravenous  Once 04/12/24 1714 04/12/24 1919        Medication:   amLODipine   10 mg Oral Daily   aspirin  EC  81 mg Oral Daily   budesonide -glycopyrrolate -formoterol   2 puff Inhalation BID   ciprofloxacin    500 mg Oral BID   clopidogrel   75 mg Oral Daily   enoxaparin  (LOVENOX ) injection  40 mg Subcutaneous Q24H   haloperidol  lactate  1 mg Intravenous Once   leflunomide   20 mg Oral Daily   metroNIDAZOLE   500 mg Oral Q8H   rosuvastatin   20 mg Oral Daily   traZODone   25 mg Oral QHS    acetaminophen  **OR** acetaminophen , haloperidol  lactate, LORazepam , ondansetron  **OR** ondansetron  (ZOFRAN ) IV, mouth rinse   Objective:   Vitals:   04/16/24 1950 04/16/24 2033 04/17/24 0404 04/17/24 0748  BP:  (!) 146/76 (!) 148/79   Pulse:  (!) 107 (!) 104   Resp:  18 18   Temp:  98.5 F (36.9 C) 98.7 F (37.1 C)   TempSrc:  Oral Oral   SpO2: 98% 98% 98% 97%  Weight:      Height:        Intake/Output Summary (Last 24 hours) at 04/17/2024 0921 Last data filed at 04/16/2024 1300 Gross per 24 hour  Intake 240 ml  Output --  Net 240 ml   Filed Weights   04/12/24 1347  Weight: 71 kg     Physical examination:   General:  AAO x 3,  cooperative, no distress;   HEENT:  Normocephalic, PERRL, otherwise with in Normal limits   Neuro:  Expressive aphasia CNII-XII intact. , normal motor and sensation, reflexes intact   Lungs:   Clear to auscultation BL, Respirations unlabored,  No wheezes / crackles  Cardio:    S1/S2, RRR, No murmure, No Rubs or Gallops   Abdomen:  Soft, non-tender, bowel sounds active all four quadrants, no guarding or peritoneal signs.  Muscular  skeletal:  Limited exam -global generalized weaknesses - in bed, able to move all 4 extremities,   2+ pulses,  symmetric, No pitting edema  Skin:  Dry, warm to touch, negative for any Rashes,  Wounds: Please see nursing documentation       ------------------------------------------------------------------------------------------------------------------------------------------    LABs:     Latest Ref Rng & Units 04/17/2024    5:26 AM 04/16/2024    3:55 AM 04/15/2024    5:09 AM  CBC  WBC 4.0 - 10.5 K/uL 12.0  14.5  11.3    Hemoglobin 12.0 - 15.0 g/dL 86.9  87.6  88.7   Hematocrit 36.0 - 46.0 % 40.1  39.0  34.2   Platelets 150 - 400 K/uL 219  223  200       Latest Ref Rng & Units 04/17/2024    5:26 AM 04/16/2024    3:55 AM 04/15/2024    5:09 AM  CMP  Glucose 70 - 99 mg/dL 891  898  85   BUN 8 - 23 mg/dL 10  11  7    Creatinine 0.44 - 1.00 mg/dL 8.92  8.77  9.02   Sodium 135 - 145 mmol/L 141  139  142   Potassium 3.5 - 5.1 mmol/L 3.6  3.6  3.2   Chloride 98 - 111 mmol/L 111  110  114   CO2 22 - 32 mmol/L 19  21  22    Calcium  8.9 - 10.3 mg/dL 9.4  9.6  8.6        Micro Results No results found for this or any previous visit (from the past 240 hours).  Radiology Reports No results found.  SIGNED: Adriana DELENA Grams, MD, FHM. FAAFP. Betty Golden - [facility] Time spent - 55 min.  In seeing, evaluating and examining the patient. Reviewing medical records, labs, drawn plan of care. [facility],  Pager (please use [email or web address] to page/ text) Please use Epic Secure Chat for non-urgent communication (7AM-7PM)  If 7PM-7AM, please contact night-coverage [email or web address], 04/17/2024, 9:21 AM

## 2024-04-17 NOTE — H&P (Signed)
 Physical Medicine and Rehabilitation Admission H&P        Chief Complaint  Patient presents with   Functional deficits due to stroke      HPI: Betty Golden is a 78 year old female with history of COPD, RA, FM, cervical stenosis w/chronic pain,  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 She was found to have leucocytosis WBC 18.7, hypokalemia K-2.6 and was moaning with abdominal tenderness at admission. CT head  showed suspicion of recent right parietal infarct. CT A/P showed mild colonic diverticulitis with descending and sigmoid wall thickening question colitis. She was started on IV antibiotics and treated with fluid bolus. MRI attempted but patient had panic attack so aborted. 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    Dr. Voncile consulted and recommended DAPT for left parietal stroke due to occluded L-MCA distribution. MRI recommended for work up but patient declined repeat attempt. She was noted to have mild left sided weakness with aphasia and therapy consulted.  Neurology recommending ASA 81 mg and Plavix  75 mg daily for 3 months followed by aspirin  alone.  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 She was independent PTA and CIR recommended due to functional decline.       Review of Systems  Constitutional:  Negative for chills and fever.  HENT:  Negative for hearing loss.   Eyes:  Negative for blurred vision and double vision.  Respiratory:  Negative for cough and shortness of  breath.   Cardiovascular:  Negative for chest pain.  Gastrointestinal:  Negative for abdominal pain, constipation, diarrhea, nausea and vomiting.  Genitourinary: Negative.   Musculoskeletal:  Negative for back pain, joint pain and neck pain.  Neurological:  Positive for sensory change, speech change and weakness. Negative for dizziness and headaches.  Psychiatric/Behavioral:  Negative for suicidal ideas. The patient has insomnia.            Past Medical History:  Diagnosis Date   Asthma     C. difficile colitis     Cervical stenosis of spine     Chronic headaches     Chronic neck pain     Coronary artery disease     Depression     Diverticulosis     Essential hypertension     Fibromyalgia     GERD (gastroesophageal reflux disease)     History of Salmonella gastroenteritis     HNP (herniated nucleus pulposus), lumbar     Hyperlipidemia     IBS (irritable bowel syndrome)     Lung nodule     rheumatoid arthritis                 Past Surgical History:  Procedure Laterality Date   APPENDECTOMY   1965   BIOPSY   08/26/2018    Procedure: BIOPSY;  Surgeon: Golda Claudis PENNER, MD;  [facility];  Service: Endoscopy;;  gastric   BREAST BIOPSY       BREAST REDUCTION SURGERY   1994   CATARACT EXTRACTION W/PHACO  Left 06/09/2023    Procedure: CATARACT EXTRACTION PHACO AND INTRAOCULAR LENS PLACEMENT (IOC);  Surgeon: Harrie Agent, MD;  Location: [facility];  Service: Ophthalmology;  Laterality: Left;  CDE 7.03   CATARACT EXTRACTION W/PHACO Right 06/23/2023    Procedure: CATARACT EXTRACTION PHACO AND INTRAOCULAR LENS PLACEMENT (IOC);  Surgeon: Harrie Agent, MD;  Location: [facility];  Service: Ophthalmology;  Laterality: Right;  CDE: 11.55   CHOLECYSTECTOMY   1975   COLONOSCOPY N/A 08/19/2013    Procedure: COLONOSCOPY;  Surgeon: Claudis RAYMOND Rivet, MD;  [facility]: [facility];  Service: Endoscopy;  Laterality: N/A;  155-moved to 140 Ann to notify pt   COLONOSCOPY N/A 08/26/2018     Procedure: COLONOSCOPY;  Surgeon: Rivet Claudis RAYMOND, MD;  [facility];  Service: Endoscopy;  Laterality: N/A;   ESOPHAGOGASTRODUODENOSCOPY N/A 08/26/2018    Procedure: ESOPHAGOGASTRODUODENOSCOPY (EGD);  Surgeon: Rivet Claudis RAYMOND, MD;  [facility]: [facility];  Service: Endoscopy;  Laterality: N/A;   fibromyalgia       LUMBAR LAMINECTOMY/DECOMPRESSION MICRODISCECTOMY Bilateral 09/20/2015    Procedure: MICRO LUMBAR DECOMPRESSION L5-S1 BILATERAL    (1 LEVEL);  Surgeon: Reyes Billing, MD;  Location: [facility];  Service: Orthopedics;  Laterality: Bilateral;   rheumatoid arthritis       Sinus Surgery       TONSILLECTOMY   1968               Family History  Problem Relation Age of Onset   Heart disease Mother     Hypertension Mother     Asthma Mother     Heart disease Father     Stroke Father     Hypertension Father     Stroke Sister     Asthma Sister     Hypertension Brother     Asthma Brother     Asthma Maternal Grandmother     Heart disease Maternal Grandmother     Diabetes Maternal Grandfather     Asthma Maternal Aunt     Colon cancer Neg Hx            Social History:  reports that she quit smoking about 35 years ago. Her smoking use included cigarettes. She started smoking about 45 years ago. She has a 20 pack-year smoking history. She has never used smokeless tobacco. She reports that she does not drink alcohol and does not use drugs.     Allergies      Allergies  Allergen Reactions   Hydromorphone  Anaphylaxis   Sulfa Antibiotics Rash                 Facility-Administered Medications Prior to Admission  Medication Dose Route Frequency Provider Last Rate Last Admin   Benralizumab  SOSY 30 mg  30 mg Subcutaneous Q8 Weeks Gallagher, Joel Louis, MD   30 mg at 02/09/24 9082            Medications Prior to Admission  Medication Sig Dispense Refill   albuterol  (PROAIR  HFA) 108 (90 Base) MCG/ACT inhaler Inhale 2 puffs into the lungs every 4 (four) hours as needed  for wheezing or shortness of breath. 18 g 3   amLODipine  (NORVASC ) 10 MG tablet Take 10 mg by mouth daily.       atorvastatin  (LIPITOR) 10 MG tablet Take 10 mg by mouth daily.       benralizumab  (FASENRA ) 30 MG/ML prefilled syringe Inject 1 mL (30 mg total) into the skin every 8 (eight) weeks. 1 mL 6  CALCIUM  PO Take 1,250 mg by mouth.       [EXPIRED] cephALEXin (KEFLEX) 500 MG capsule Take 1,000 mg by mouth 2 (two) times daily.       Certolizumab Pegol (CIMZIA Grimes) Inject 1 Dose into the skin every 14 (fourteen) days.       Cetirizine HCl (ZYRTEC PO) Take 10 mg by mouth daily at 6 (six) AM.       clindamycin (CLEOCIN T) 1 % lotion Apply 1 Application topically daily.       colestipol  (COLESTID ) 1 g tablet Take 2 g by mouth daily. Take 4 hours before or after other medications.       DULoxetine  (CYMBALTA ) 30 MG capsule Take 30 mg by mouth 2 (two) times daily.       Ferrous Sulfate (IRON PO) Take 65 mg by mouth daily.       fluticasone  (FLONASE ) 50 MCG/ACT nasal spray SPRAY 2 SPRAYS INTO EACH NOSTRIL EVERY DAY 48 mL 1   Fluticasone -Umeclidin-Vilant (TRELEGY ELLIPTA ) 200-62.5-25 MCG/ACT AEPB Inhale 1 puff into the lungs in the morning. 90 each 3   leflunomide  (ARAVA ) 20 MG tablet Take 20 mg by mouth daily.       losartan -hydrochlorothiazide  (HYZAAR) 100-12.5 MG tablet Take 1 tablet by mouth daily.       MAGNESIUM  PO Take 400 mg by mouth daily at 6 (six) AM.       Multiple Vitamin (MULTIVITAMIN PO) Take 1 tablet by mouth daily at 6 (six) AM.       pantoprazole  (PROTONIX ) 40 MG tablet Take 1 tablet (40 mg total) by mouth daily. 30 tablet 0   potassium chloride  SA (KLOR-CON  M) 20 MEQ tablet Take 1 tablet (20 mEq total) by mouth daily. 6 tablet 0   pregabalin  (LYRICA ) 50 MG capsule Take 50 mg by mouth 2 (two) times daily.       RESTASIS  0.05 % ophthalmic emulsion Place 1 drop into both eyes 2 (two) times daily.       vitamin B-12 (CYANOCOBALAMIN ) 1000 MCG tablet Take 1 tablet (1,000 mcg total) by  mouth daily. 30 tablet 3            Home: Home Living Family/patient expects to be discharged to:: Private residence Living Arrangements: Children Available Help at Discharge: Family, Available 24 hours/day Type of Home: House Home Access: Ramped entrance Home Layout: Two level, Able to live on main level with bedroom/bathroom, Full bath on main level Bathroom Shower/Tub: Health visitor: Standard Bathroom Accessibility: Yes Home Equipment: Agricultural consultant (2 wheels), Wheelchair - manual, [employer] - single point, BSC/3in1 Additional Comments: per chart; pt is a poor historian  Lives With: Family   Functional History: Prior Function Prior Level of Function : Patient poor historian/Family not available, Needs assist Physical Assist : ADLs (physical) Mobility (physical): Bed mobility, Transfers, Gait Mobility Comments: household ambulator with no AD, community ambulator with SPC (per chart) ADLs Comments: assited by family (per chart; pt reported assist but also independence; unsure of true baseline ADL function.)   Functional Status:  Mobility: Bed Mobility Overal bed mobility: Needs Assistance Bed Mobility: Supine to Sit, Sit to Supine Supine to sit: Supervision Sit to supine: Used rails, Min assist General bed mobility comments: Up in recliner on entry Transfers Overall transfer level: Needs assistance Equipment used: Rolling walker (2 wheels) Transfers: Sit to/from Stand, Bed to chair/wheelchair/BSC Sit to Stand: Mod assist Bed to/from chair/wheelchair/BSC transfer type:: Step pivot Step pivot transfers: Mod assist, Min assist General  transfer comment: increased effort and time, improving to min with prolonged standing Ambulation/Gait Ambulation/Gait assistance: Mod assist Gait Distance (Feet): 10 Feet Assistive device: Rolling walker (2 wheels) Gait Pattern/deviations: Step-through pattern, Decreased stride length, Knees buckling General Gait Details: Pt  experienced difficulty maintaining knee extension in stance phase on the LLE Gait velocity: decreased   ADL: ADL Overall ADL's : Needs assistance/impaired Eating/Feeding: Set up, Sitting Eating/Feeding Details (indicate cue type and reason): finishing snack and drink at beginning of session Grooming: Set up Grooming Details (indicate cue type and reason): Provided washcloth, able to wash face and hands with no assist. Upper Body Bathing: Contact guard assist, Sitting, Supervision/ safety Lower Body Bathing: Supervison/ safety, Contact guard assist, Sitting/lateral leans Upper Body Dressing : Supervision/safety, Contact guard assist, Sitting Lower Body Dressing: Supervision/safety, Contact guard assist, Sitting/lateral leans Lower Body Dressing Details (indicate cue type and reason): Able to doff and don L sock seated in the chair. Toilet Transfer: Moderate assistance, Minimal assistance, Ambulation, Rolling walker (2 wheels) Toilet Transfer Details (indicate cue type and reason): increased time and effort, initially mod A, improving to min A with slight R sided lean Toileting- Clothing Manipulation and Hygiene: Contact guard assist, Minimal assistance, Sitting/lateral lean Toileting - Clothing Manipulation Details (indicate cue type and reason): Needs assist with clothing management Tub/ Shower Transfer: Moderate assistance, Ambulation, Rolling walker (2 wheels) Functional mobility during ADLs: Moderate assistance, Rolling walker (2 wheels) General ADL Comments: Pt appears to be improving with mobility and cognition.   Cognition: Cognition Overall Cognitive Status: Difficult to assess Orientation Level: Oriented to person, Oriented to place Cognition Arousal: Alert Behavior During Therapy: [facility] for tasks assessed/performed Overall Cognitive Status: Difficult to assess     Blood pressure (!) 148/79, pulse (!) 104, temperature 98.7 F (37.1 C), temperature source Oral, resp. rate 18,  height 5' 3 (1.6 m), weight 71 kg, SpO2 97%. Physical Exam   General: No apparent distress HEENT: Head is normocephalic, atraumatic, sclera anicteric, oral mucosa pink and moist Heart: Reg rate and rhythm. No murmurs rubs or gallops Chest: CTA bilaterally without wheezes, rales, or rhonchi; no distress Abdomen: Soft, non-tender, non-distended, bowel sounds positive. Extremities: 1+ LE edema Psych: Pt is cooperative, frustrated with speech deficits but overall very pleasant Skin: Clean and intact without signs of breakdown on visible portion , bruising on her arms Neuro:     Mental Status: AAOx to day, for month before October, for year able to get with choices, unable to say her name   Speech/Languate: Aphasia more expressive than receptive, able to name 2/3 items, cannot repeat, paraphasias, able to follow intermittently simple commands, occasionally requiring some cueing CRANIAL NERVES: II: PERRL.  III, IV, VI: EOM -appears to be intact although required several attempts due to communication to get her to follow V: normal sensation bilaterally VII: slight R facial weakness, mild right eye ptosis VIII: normal hearing to speech IX, X: normal palatal elevation XI: Head turn appears symmetrical XII: Tongue midline     MOTOR: RUE: 4/5 Deltoid, 4/5 Biceps, 4/5 Triceps,4/5 Grip LUE: 4/5 Deltoid, 4/5 Biceps, 4/5 Triceps, 4/5 Grip RLE: HF 4/5, KE 4/5, ADF 4/5, APF 5/5 LLE: HF 4-/5, KE 4-/5, ADF 4/5, APF 5/5     REFLEXES: No ankle clonus   SENSORY: Normal to touch all 4 extremities   Coordination: Normal finger to nose bilaterally       Lab Results Last 48 Hours        Results for orders placed or performed during  the hospital encounter of 04/12/24 (from the past 48 hours)  CBC     Status: Abnormal    Collection Time: 04/16/24  3:55 AM  Result Value Ref Range    WBC 14.5 (H) 4.0 - 10.5 K/uL    RBC 4.33 3.87 - 5.11 MIL/uL    Hemoglobin 12.3 12.0 - 15.0 g/dL    HCT 60.9  63.9 - 53.9 %    MCV 90.1 80.0 - 100.0 fL    MCH 28.4 26.0 - 34.0 pg    MCHC 31.5 30.0 - 36.0 g/dL    RDW 85.8 88.4 - 84.4 %    Platelets 223 150 - 400 K/uL    nRBC 0.0 0.0 - 0.2 %      Comment: Performed at [facility], [address] [place], [place] [address]  Basic metabolic panel with GFR     Status: Abnormal    Collection Time: 04/16/24  3:55 AM  Result Value Ref Range    Sodium 139 135 - 145 mmol/L    Potassium 3.6 3.5 - 5.1 mmol/L    Chloride 110 98 - 111 mmol/L    CO2 21 (L) 22 - 32 mmol/L    Glucose, Bld 101 (H) 70 - 99 mg/dL      Comment: Glucose reference range applies only to samples taken after fasting for at least 8 hours.    BUN 11 8 - 23 mg/dL    Creatinine, Ser 8.77 (H) 0.44 - 1.00 mg/dL    Calcium  9.6 8.9 - 10.3 mg/dL    GFR, Estimated 46 (L) >60 mL/min      Comment: (NOTE) Calculated using the CKD-EPI Creatinine Equation (2021)      Anion gap 8 5 - 15      Comment: Performed at [facility], [address], [place], [place] [address]  CBC     Status: Abnormal    Collection Time: 04/17/24  5:26 AM  Result Value Ref Range    WBC 12.0 (H) 4.0 - 10.5 K/uL    RBC 4.47 3.87 - 5.11 MIL/uL    Hemoglobin 13.0 12.0 - 15.0 g/dL    HCT 59.8 63.9 - 53.9 %    MCV 89.7 80.0 - 100.0 fL    MCH 29.1 26.0 - 34.0 pg    MCHC 32.4 30.0 - 36.0 g/dL    RDW 85.8 88.4 - 84.4 %    Platelets 219 150 - 400 K/uL    nRBC 0.0 0.0 - 0.2 %      Comment: Performed at [facility], [address], [place], [place] [address]  Basic metabolic panel with GFR     Status: Abnormal    Collection Time: 04/17/24  5:26 AM  Result Value Ref Range    Sodium 141 135 - 145 mmol/L    Potassium 3.6 3.5 - 5.1 mmol/L    Chloride 111 98 - 111 mmol/L    CO2 19 (L) 22 - 32 mmol/L    Glucose, Bld 108 (H) 70 - 99 mg/dL      Comment: Glucose reference range applies only to samples taken after fasting for at least 8 hours.    BUN 10 8 - 23 mg/dL    Creatinine, Ser 8.92 (H) 0.44 - 1.00 mg/dL    Calcium  9.4  8.9 - 10.3 mg/dL    GFR, Estimated 54 (L) >60 mL/min      Comment: (NOTE) Calculated using the CKD-EPI Creatinine Equation (2021)      Anion  gap 11 5 - 15      Comment: Performed at [facility], [address], [place], [place] [address]      Imaging Results (Last 48 hours)  No results found.           Blood pressure (!) 148/79, pulse (!) 104, temperature 98.7 F (37.1 C), temperature source Oral, resp. rate 18, height 5' 3 (1.6 m), weight 71 kg, SpO2 97%.   Medical Problem List and Plan: 1. 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  Patient did not tolerate MRI             -patient may shower             -ELOS/Goals: 10-12 days, PT/OT/SLP supervision             -Admit to CIR 2.  Antithrombotics: -DVT/anticoagulation:  Pharmaceutical: Lovenox              -antiplatelet therapy: DAPT X 3 months followed by ASA alone.  Discharge summary and decubitus.  Discharge summary indicated aspirin  and Plavix  for 21 days in 1 location, will message hospitalist to confirm 3 months 3. Pain Management:  Tylenol  prn.  4. Mood/Behavior/Sleep: LCSW to follow for evaluation and support             --trazodone  25 mg at bedtime. Ativan  prn for anxiety/agitation due to aphasia?              -antipsychotic agents: has had haldol  prn--last used on 09/18. 5. Neuropsych/cognition: This patient may be capable of making decisions on her own behalf. 6. Skin/Wound Care: Routine pressure relief measures.             -- Monitor skin integrity 7. Fluids/Electrolytes/Nutrition: Monitor I/O.  --Check CMET in am 8. Diverticulitis/Colitis: Transitioned to cipro  and Flagyl  antibiotic D#              --WBC trending down today but also had drop in H/H. Monitor for signs of bleeding.               - Follow-up with gastroenterologist outpatient 9. HTN: Monitor BP TID--avoid hypoperfusion. BP meds have been resumed.  Monitor with activity. --On amlodipine , 10. COPD:  On Breztri  bid with Fasenra  every 8 weeks. 11. RA: Stable on Leflunomide .  12. Hypokalemia: Being supplemented daily but still low --Recheck in am 13. Hypomagnesemia: Recheck in am.  14.  ABLA: Monitor for rectal bleeding. Hgb down from 12.8 to 11.2. Back up to 13.0 on 9/20 15, Delirium: Resolving but needed ativan   16. Leukocytosis: 12.0 on 9/20, see #8   Sharlet GORMAN Schmitz, PA-C 04/17/2024   I have personally performed a face to face diagnostic evaluation of this patient and formulated the key components of the plan.  Additionally, I have personally reviewed laboratory data, imaging studies, as well as relevant notes and concur with the physician assistant's documentation above.   The patient's status has not changed from the original H&P.  Any changes in documentation from the acute care chart have been noted above.   Murray Collier, MD

## 2024-04-17 NOTE — Progress Notes (Signed)
 Inpatient Rehabilitation Admission Medication Review by a Pharmacist  A complete drug regimen review was completed for this patient to identify any potential clinically significant medication issues.  High Risk Drug Classes Is patient taking? Indication by Medication  Antipsychotic Yes Compazine  for Nausea/vomiting  Anticoagulant Yes Lovenox  - VTE prophylaxis  Antibiotic Yes Cipro  and flagyl  - diverticulitis  Opioid No   Antiplatelet Yes ASA, Plavix  - Stroke  Hypoglycemics/insulin No   Vasoactive Medication Yes Amlodipine  - HTN  Chemotherapy No   Other Yes Melatonin, Trazadone for sleep Breztri  - COPD Arava  - RA Ativan  - anxiety     Type of Medication Issue Identified Description of Issue Recommendation(s)  Drug Interaction(s) (clinically significant)     Duplicate Therapy     Allergy     No Medication Administration End Date  Cirpo and Flagyl  Please specify desired length of therapy  Incorrect Dose     Additional Drug Therapy Needed     Significant med changes from prior encounter (inform family/care partners about these prior to discharge). Crestor  PTA  Duloxetine  PTA  Fasenra  PTA  Metoprolol  PTA Protonix  PTA Lipitor inpt - please only send patient home on one of these agents Monitor for symptoms of depression, restart if needed Consider restarting at discharge for asthma Monitor for HTN, restart as needed Monitor for GERD, restart as needed   Other       Clinically significant medication issues were identified that warrant physician communication and completion of prescribed/recommended actions by midnight of the next day:  No  Name of provider notified for urgent issues identified:   Provider Method of Notification:     Pharmacist comments:   Time spent performing this drug regimen review (minutes):  25   Dorothyann DELENA Alert 04/17/2024 2:59 PM

## 2024-04-17 NOTE — TOC Transition Note (Signed)
 Transition of Care [facility]) - Discharge Note   Patient Details  Name: Betty Golden MRN: [id number] Date of Birth: [birth year]  [facility] CM/SW Contact:  Nena LITTIE Coffee, RN Phone Number: 04/17/2024, 11:58 AM   Clinical Narrative:    Pt to dc to CIR this afternoon.     Barriers to Discharge: Continued Medical Work up   Patient Goals and CMS Choice            Discharge Placement                       Discharge Plan and Services Additional resources added to the After Visit Summary for                            [facility] Arranged: PT HH Agency: [facility] Date [facility] Agency Contacted: 04/13/24 Time HH Agency Contacted: [phone] Representative spoke with at [facility] Agency: Darleene  Social Drivers of Health (SDOH) Interventions SDOH Screenings   Food Insecurity: Patient Unable To Answer (04/13/2024)  Housing: Unknown (04/13/2024)  Transportation Needs: Patient Unable To Answer (04/13/2024)  Utilities: Patient Unable To Answer (04/13/2024)  Financial Resource Strain: Low Risk  (04/04/2019)  Physical Activity: Inactive (04/04/2019)  Social Connections: Patient Unable To Answer (04/13/2024)  Recent Concern: Social Connections - Socially Isolated (02/27/2024)  Stress: No Stress Concern Present (04/04/2019)  Tobacco Use: Medium Risk (04/12/2024)     Readmission Risk Interventions     No data to display

## 2024-04-17 NOTE — Progress Notes (Signed)
 Inpatient Rehab Admissions Coordinator:  There is a bed available for pt today in Cir. Dr. Willette is aware and in agreement. Pt's daughter [name], NSG and TOC made aware.  Tinnie Yvone Cohens, MS, CCC-SLP Admissions Coordinator [phone]

## 2024-04-18 DIAGNOSIS — K5732 Diverticulitis of large intestine without perforation or abscess without bleeding: Secondary | ICD-10-CM | POA: Diagnosis not present

## 2024-04-18 DIAGNOSIS — I63512 Cerebral infarction due to unspecified occlusion or stenosis of left middle cerebral artery: Secondary | ICD-10-CM | POA: Diagnosis not present

## 2024-04-18 DIAGNOSIS — I10 Essential (primary) hypertension: Secondary | ICD-10-CM | POA: Diagnosis not present

## 2024-04-18 LAB — CBC WITH DIFFERENTIAL/PLATELET
Abs Immature Granulocytes: 0.04 K/uL (ref 0.00–0.07)
Basophils Absolute: 0 K/uL (ref 0.0–0.1)
Basophils Relative: 0 %
Eosinophils Absolute: 0 K/uL (ref 0.0–0.5)
Eosinophils Relative: 0 %
HCT: 39.1 % (ref 36.0–46.0)
Hemoglobin: 12.8 g/dL (ref 12.0–15.0)
Immature Granulocytes: 0 %
Lymphocytes Relative: 35 %
Lymphs Abs: 4.8 K/uL — ABNORMAL HIGH (ref 0.7–4.0)
MCH: 28.6 pg (ref 26.0–34.0)
MCHC: 32.7 g/dL (ref 30.0–36.0)
MCV: 87.3 fL (ref 80.0–100.0)
Monocytes Absolute: 1.1 K/uL — ABNORMAL HIGH (ref 0.1–1.0)
Monocytes Relative: 8 %
Neutro Abs: 7.7 K/uL (ref 1.7–7.7)
Neutrophils Relative %: 57 %
Platelets: 216 K/uL (ref 150–400)
RBC: 4.48 MIL/uL (ref 3.87–5.11)
RDW: 14 % (ref 11.5–15.5)
WBC: 13.6 K/uL — ABNORMAL HIGH (ref 4.0–10.5)
nRBC: 0 % (ref 0.0–0.2)

## 2024-04-18 LAB — COMPREHENSIVE METABOLIC PANEL WITH GFR
ALT: 64 U/L — ABNORMAL HIGH (ref 0–44)
AST: 77 U/L — ABNORMAL HIGH (ref 15–41)
Albumin: 3.7 g/dL (ref 3.5–5.0)
Alkaline Phosphatase: 44 U/L (ref 38–126)
Anion gap: 11 (ref 5–15)
BUN: 9 mg/dL (ref 8–23)
CO2: 19 mmol/L — ABNORMAL LOW (ref 22–32)
Calcium: 9.7 mg/dL (ref 8.9–10.3)
Chloride: 110 mmol/L (ref 98–111)
Creatinine, Ser: 1.13 mg/dL — ABNORMAL HIGH (ref 0.44–1.00)
GFR, Estimated: 50 mL/min — ABNORMAL LOW (ref >60)
Glucose, Bld: 93 mg/dL (ref 70–99)
Potassium: 3.8 mmol/L (ref 3.5–5.1)
Sodium: 140 mmol/L (ref 135–145)
Total Bilirubin: 0.6 mg/dL (ref 0.0–1.2)
Total Protein: 6.7 g/dL (ref 6.5–8.1)

## 2024-04-18 LAB — MAGNESIUM: Magnesium: 1.8 mg/dL (ref 1.7–2.4)

## 2024-04-18 MED ORDER — METRONIDAZOLE 500 MG PO TABS
500.0000 mg | ORAL_TABLET | Freq: Two times a day (BID) | ORAL | Status: AC
Start: 2024-04-18 — End: 2024-04-24
  Administered 2024-04-18 – 2024-04-24 (×12): 500 mg via ORAL
  Filled 2024-04-18 (×13): qty 1

## 2024-04-18 MED ORDER — DULOXETINE HCL 30 MG PO CPEP
30.0000 mg | ORAL_CAPSULE | Freq: Two times a day (BID) | ORAL | Status: DC
  Administered 2024-04-18 – 2024-04-28 (×21): 30 mg via ORAL
  Filled 2024-04-18 (×21): qty 1

## 2024-04-18 MED ORDER — MELATONIN 5 MG PO TABS
5.0000 mg | ORAL_TABLET | Freq: Every day | ORAL | Status: DC
  Administered 2024-04-18 – 2024-04-27 (×10): 5 mg via ORAL
  Filled 2024-04-18 (×10): qty 1

## 2024-04-18 NOTE — Progress Notes (Signed)
 PROGRESS NOTE   Subjective/Complaints:  Pt doing well, but slept poorly, agreeable to melatonin being scheduled. Denies pain. LBM last night but not documented. Urinating fine. No other complaints or concerns.  Does recall that she takes cymbalta  30mg  BID at home.    ROS: as per HPI. Denies CP, SOB, abd pain, N/V/D/C, or any other complaints at this time.    Objective:   No results found. Recent Labs    04/17/24 0526 04/18/24 0655  WBC 12.0* 13.6*  HGB 13.0 12.8  HCT 40.1 39.1  PLT 219 216   Recent Labs    04/17/24 0526 04/18/24 0655  NA 141 140  K 3.6 3.8  CL 111 110  CO2 19* 19*  GLUCOSE 108* 93  BUN 10 9  CREATININE 1.07* 1.13*  CALCIUM  9.4 9.7        Intake/Output Summary (Last 24 hours) at 04/18/2024 1123 Last data filed at 04/18/2024 0800 Gross per 24 hour  Intake 340 ml  Output --  Net 340 ml        Physical Exam: Vital Signs Blood pressure (!) 161/92, pulse (!) 102, temperature 98.8 F (37.1 C), temperature source Oral, resp. rate 16, height 5' 3 (1.6 m), weight 69.1 kg, SpO2 97%.  General: No apparent distress, sitting down in chair, finishing therapy.  HEENT: Head is normocephalic, atraumatic, sclera anicteric, oral mucosa pink and moist Heart: Reg rate and rhythm. No murmurs rubs or gallops Chest: CTA bilaterally without wheezes, rales, or rhonchi; no distress Abdomen: Soft, non-tender, non-distended, bowel sounds positive. Extremities: 1+ LE edema Psych: Pt is cooperative, frustrated with speech deficits but overall very pleasant Skin: Clean and intact without signs of breakdown on visible portion , bruising on her arms Neuro:  aphasia.   PRIOR EXAMS: Neuro: Mental Status: AAOx to day, for month before October, for year able to get with choices, unable to say her name   Speech/Languate: Aphasia more expressive than receptive, able to name 2/3 items, cannot repeat, paraphasias, able  to follow intermittently simple commands, occasionally requiring some cueing CRANIAL NERVES: II: PERRL.  III, IV, VI: EOM -appears to be intact although required several attempts due to communication to get her to follow V: normal sensation bilaterally VII: slight R facial weakness, mild right eye ptosis VIII: normal hearing to speech IX, X: normal palatal elevation XI: Head turn appears symmetrical XII: Tongue midline     MOTOR: RUE: 4/5 Deltoid, 4/5 Biceps, 4/5 Triceps,4/5 Grip LUE: 4/5 Deltoid, 4/5 Biceps, 4/5 Triceps, 4/5 Grip RLE: HF 4/5, KE 4/5, ADF 4/5, APF 5/5 LLE: HF 4-/5, KE 4-/5, ADF 4/5, APF 5/5     REFLEXES: No ankle clonus   SENSORY: Normal to touch all 4 extremities   Coordination: Normal finger to nose bilaterally  Assessment/Plan: 1. Functional deficits which require 3+ hours per day of interdisciplinary therapy in a comprehensive inpatient rehab setting. Physiatrist is providing close team supervision and 24 hour management of active medical problems listed below. Physiatrist and rehab team continue to assess barriers to discharge/monitor patient progress toward functional and medical goals  Care Tool:  Bathing  Bathing assist       Upper Body Dressing/Undressing Upper body dressing        Upper body assist      Lower Body Dressing/Undressing Lower body dressing            Lower body assist       Toileting Toileting    Toileting assist       Transfers Chair/bed transfer  Transfers assist           Locomotion Ambulation   Ambulation assist              Walk 10 feet activity   Assist           Walk 50 feet activity   Assist           Walk 150 feet activity   Assist           Walk 10 feet on uneven surface  activity   Assist           Wheelchair     Assist               Wheelchair 50 feet with 2 turns activity    Assist            Wheelchair  150 feet activity     Assist          Blood pressure (!) 161/92, pulse (!) 102, temperature 98.8 F (37.1 C), temperature source Oral, resp. rate 16, height 5' 3 (1.6 m), weight 69.1 kg, SpO2 97%.  Medical Problem List and Plan: 1. 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  Patient did not tolerate MRI             -patient may shower             -ELOS/Goals: 10-12 days, PT/OT/SLP supervision             -Admit to CIR 2.  Antithrombotics: -DVT/anticoagulation:  Pharmaceutical: Lovenox  40mg  daily -antiplatelet therapy: DAPT X 3 months followed by ASA alone.  Discharge summary and decubitus.  Discharge summary indicated aspirin  and Plavix  for 21 days in 1 location, message hospitalist to confirm 3 months 3. Pain Management:  Tylenol  prn.  4. Mood/Behavior/Sleep: LCSW to follow for evaluation and support --trazodone  25 mg at bedtime. Ativan  prn for anxiety/agitation due to aphasia?   -04/18/24 still having trouble sleeping-- scheduled melatonin 5mg              -antipsychotic agents: has had haldol  prn--last used on 09/18. Not reordered -DC summary indicates cymbalta  60mg  daily, does not appear she got this at the acute hospital. Pt states she uses 30mg  BID-- reordered 5. Neuropsych/cognition: This patient may be capable of making decisions on her own behalf. 6. Skin/Wound Care: Routine pressure relief measures.             -- Monitor skin integrity 7. Fluids/Electrolytes/Nutrition: Monitor I/O. Routine labs. Vitamins/supplements.  -04/18/24 CMP with mildly elevated LFTs (AST 77/ALT 64), and mildly bumped Cr 1.13-- monitor repeat LFTs on Thurs, BMP tomorrow.  8. Diverticulitis/Colitis: Transitioned to cipro  500mg  bid and Flagyl  500 tid orally on 9/18, IV abx from 9/15-9/18 -WBC trending down today but also had drop in H/H. Monitor for signs of bleeding.               - Follow-up with gastroenterologist outpatient -04/18/24 unclear how  many days of PO abx were indicated-- had 3 days IV,  looks like PO were ordered for 3 days on acute? But that would only be 6d total-- WBC today a little up at 13.6   -will leave PO abx on for now   -consider stopping abx 9/22-25 for full 7-10 days of tx 9. HTN: Monitor BP TID--avoid hypoperfusion. BP meds have been resumed.  Monitor with activity. --On amlodipine  10mg  daily -04/18/24 BP variable, leave meds as is for now, monitor  Vitals:   04/17/24 1712 04/17/24 1954 04/18/24 0336  BP: 99/85 (!) 145/98 (!) 161/92    10. COPD: On Breztri  bid with Fasenra  every 8 weeks. 11. RA: Stable on Leflunomide  20mg  daily.  12. Hypokalemia: Being supplemented daily but still low -04/18/24 K 3.8, monitor routine labs 13. Hypomagnesemia: 1.8 on recheck 9/21, monitor as needed 14.  ABLA: Monitor for rectal bleeding. Hgb down from 12.8 to 11.2. Back up to 13.0 on 9/20-- 12.8 9/21, monitor routinely 15, Delirium: Resolving but needed ativan   16. Leukocytosis: 12.0 on 9/20, see #8 17. HLD: continue rosuvastatin  20mg  daily 18. GERD: protonix  40mg  daily.     LOS: 1 days A FACE TO FACE EVALUATION WAS PERFORMED  [address] 04/18/2024, 11:23 AM

## 2024-04-18 NOTE — Progress Notes (Signed)
 Physical Therapy Session Note  Patient Details  Name: Betty Golden MRN: [id number] Date of Birth: 17-Sep-1945  Today's Date: 04/18/2024 PT Individual Time: 1302-1357 PT Individual Time Calculation (min): 55 min   Short Term Goals: Week 1:  PT Short Term Goal 1 (Week 1): STGs = LTGs  Skilled Therapeutic Interventions/Progress Updates:  Pt was seen bedside in the pm. Pt performed all transfers with rolling walker and contact guard with verbal cues. Pt ambulated 150, 50, 140, and 125 feet with rolling walker and contact guard. Decreased step length and cadence noted. Pt ascended/descended 1 curb step with contact guard to min A and verbal cues. Pt performed step taps 3 sets x 10 reps for NMR. Pt rode Nu-step x 4 minutes at level 1 for LE strengthening and endurance. Pt returned to room and left sitting up in recliner with chair alarm and call bell within reach.   Therapy Documentation Precautions:  Precautions Precautions: Fall Recall of Precautions/Restrictions: Impaired Precaution/Restrictions Comments: fall risk and word salad on occassion Restrictions Weight Bearing Restrictions Per Provider Order: No General:   Pain: No c/o pain.     Therapy/Group: Individual Therapy  Merilee Lynwood MATSU 04/18/2024, 3:49 PM

## 2024-04-18 NOTE — Evaluation (Signed)
 Occupational Therapy Assessment and Plan  Patient Details  Name: Betty Golden MRN: [id number] Date of Birth: 12/09/1945  OT Diagnosis: cognitive deficits, muscle weakness (generalized), and verbal expression. Rehab Potential:   ELOS:     Today's Date: 04/18/2024 OT Individual Time: 1045-1200 OT Individual Time Calculation (min): 75 min     Hospital Problem: Principal Problem:   Acute ischemic left middle cerebral artery (MCA) stroke (HCC)   Past Medical History:  Past Medical History:  Diagnosis Date   Asthma    C. difficile colitis    Cervical stenosis of spine    Chronic headaches    Chronic neck pain    Coronary artery disease    Depression    Diverticulosis    Essential hypertension    Fibromyalgia    GERD (gastroesophageal reflux disease)    History of Salmonella gastroenteritis    HNP (herniated nucleus pulposus), lumbar    Hyperlipidemia    IBS (irritable bowel syndrome)    Lung nodule    rheumatoid arthritis    Past Surgical History:  Past Surgical History:  Procedure Laterality Date   APPENDECTOMY  1965   BIOPSY  08/26/2018   Procedure: BIOPSY;  Surgeon: Golda Claudis PENNER, MD;  Location: [facility];  Service: Endoscopy;;  gastric   BREAST BIOPSY     BREAST REDUCTION SURGERY  1994   CATARACT EXTRACTION W/PHACO Left 06/09/2023   Procedure: CATARACT EXTRACTION PHACO AND INTRAOCULAR LENS PLACEMENT (IOC);  Surgeon: Harrie Agent, MD;  Location: [facility];  Service: Ophthalmology;  Laterality: Left;  CDE 7.03   CATARACT EXTRACTION W/PHACO Right 06/23/2023   Procedure: CATARACT EXTRACTION PHACO AND INTRAOCULAR LENS PLACEMENT (IOC);  Surgeon: Harrie Agent, MD;  Location: [facility];  Service: Ophthalmology;  Laterality: Right;  CDE: 11.55   CHOLECYSTECTOMY  1975   COLONOSCOPY N/A 08/19/2013   Procedure: COLONOSCOPY;  Surgeon: Claudis PENNER Golda, MD;  Location: [facility];  Service: Endoscopy;  Laterality: N/A;  155-moved to 140 Ann to notify pt   COLONOSCOPY N/A  08/26/2018   Procedure: COLONOSCOPY;  Surgeon: Golda Claudis PENNER, MD;  Location: [facility];  Service: Endoscopy;  Laterality: N/A;   ESOPHAGOGASTRODUODENOSCOPY N/A 08/26/2018   Procedure: ESOPHAGOGASTRODUODENOSCOPY (EGD);  Surgeon: Golda Claudis PENNER, MD;  Location: [facility];  Service: Endoscopy;  Laterality: N/A;   fibromyalgia     LUMBAR LAMINECTOMY/DECOMPRESSION MICRODISCECTOMY Bilateral 09/20/2015   Procedure: MICRO LUMBAR DECOMPRESSION L5-S1 BILATERAL    (1 LEVEL);  Surgeon: Reyes Billing, MD;  Location: [facility];  Service: Orthopedics;  Laterality: Bilateral;   rheumatoid arthritis     Sinus Surgery     TONSILLECTOMY  1968    Assessment & Plan Clinical Impression:  Betty Golden is a 78 year old female with history of COPD, RA, FM, cervical stenosis w/chronic pain,  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 She was found to have leucocytosis WBC 18.7, hypokalemia K-2.6 and was moaning with abdominal tenderness at admission. CT head  showed suspicion of recent right parietal infarct. CT A/P showed mild colonic diverticulitis with descending and sigmoid wall thickening question colitis. She was started on IV antibiotics and treated with fluid bolus. MRI attempted but patient had panic attack so aborted. CTA head/neck showed new occlusion of proximal M2 branch  of L-MCA new since 02/28/24 and diminished number/caliber of vessels c/w right.    Dr. Voncile consulted and recommended DAPT for left parietal stroke due to occluded L-MCA distribution. MRI recommended for work up but patient declined repeat attempt. She was noted to have mild left sided weakness with aphasia and therapy consulted.  Neurology recommending ASA 81 mg and Plavix  75 mg daily for 3 months  followed by aspirin  alone.  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 She was independent PTA and CIR recommended due to functional decline.   Patient transferred to CIR on 04/17/2024 .    Patient currently requires CGA to MinA with basic self-care skills secondary to muscle weakness, impaired timing and sequencing, decreased attention to left, and decreased safety awareness and decreased memory.  Prior to hospitalization, patient could complete BADL with independent ModI.  Patient will benefit from skilled intervention to decrease level of assist with basic self-care skills prior to discharge home with care partner.  Anticipate patient will require 24 hour supervision and no further OT follow recommended.  OT - End of Session Endurance Deficit: Yes OT Recommendation Equipment Recommended: To be determined   OT Evaluation Precautions/Restrictions  Precautions Precautions: Fall Recall of Precautions/Restrictions: Impaired Precaution/Restrictions Comments: fall risk and word salad on occassion Restrictions Weight Bearing Restrictions Per Provider Order: No General Chart Reviewed: Yes Family/Caregiver Present: Yes Vital Signs Therapy Vitals Temp: 98 F (36.7 C) Pulse Rate: (!) 109 Resp: 18 BP: (!) 160/78 Patient Position (if appropriate): Sitting Oxygen Therapy SpO2: 100 % O2 Device: Room Air Pain Pain Assessment Pain Scale: 0-10 Home Living/Prior Functioning Home Living Family/patient expects to be discharged to:: Private residence Living Arrangements: Children Available Help at Discharge: Family, Available 24 hours/day (daughter, daughter husband, and twin grandsons) Type of Home: House Home Access: Stairs to enter Secretary/administrator of Steps: 1 stair to the garage with 1/2 rise to entrance is her  best option oppose to front entrance Entrance Stairs-Rails: Right (full rail on R) Home Layout:  (3 level home) Bathroom Shower/Tub: Walk-in shower (in her area with no seat of grab bars) Bathroom Toilet: Standard Bathroom Accessibility: Yes  Lives With: Family (Daughter, her husband, and their twin sons) IADL History Homemaking Responsibilities: No (daughter cooks most meals) Mode of Transportation: Set designer (per family) Occupation: Retired Equities trader) Type of Occupation: Curator Leisure and Hobbies: puzzles Prior Function Level of Independence: Independent with basic ADLs, Independent with gait  Able to Take Stairs?: Yes Leisure: Hobbies-yes (Comment) (puzzle) Vision Baseline Vision/History: 0 No visual deficits Ability to See in Adequate Light: 0 Adequate Patient Visual Report: No change from baseline Vision Assessment?: Wears glasses for reading Perception  Perception: Impaired Praxis   Cognition Cognition Overall Cognitive Status: Impaired/Different from baseline Arousal/Alertness: Awake/alert Memory: Impaired Memory Impairment: Decreased recall of new information;Retrieval deficit Problem Solving: Impaired Problem Solving Impairment: Verbal basic Safety/Judgment: Impaired Comments: pt follows simple one step commands, increased difficulty with multistep commands Brief Interview for Mental Status (BIMS) Repetition of Three Words (First Attempt): 1 Temporal Orientation: Year: Missed by more than 5 years Temporal Orientation: Month: Accurate within 5 days Temporal Orientation: Day: Correct Recall: Sock: No, could not recall Recall: Blue: Yes, no cue required Recall: Bed: No, could not recall BIMS Summary Score: 6 Sensation Sensation Light Touch: Impaired Detail Light Touch Impaired Details: Impaired LLE;Impaired RLE Coordination Gross Motor Movements  are Fluid and Coordinated: No Fine Motor Movements are Fluid and Coordinated: No Coordination and Movement  Description: generalized weakness L weaker than R Motor  Motor Motor: Other (comment) Motor - Skilled Clinical Observations: generalized weakness L greater than R LE  Trunk/Postural Assessment  Cervical Assessment Cervical Assessment: Exceptions to [facility] Thoracic Assessment Thoracic Assessment: Exceptions to [facility] Lumbar Assessment Lumbar Assessment: Exceptions to [facility] Postural Control Postural Control: Deficits on evaluation  Balance Static Sitting Balance Static Sitting - Balance Support: Bilateral upper extremity supported Static Sitting - Level of Assistance: 5: Stand by assistance Dynamic Sitting Balance Sitting balance - Comments: seated in chair Static Standing Balance Static Standing - Balance Support: Bilateral upper extremity supported;During functional activity Static Standing - Level of Assistance: 4: Min assist Extremity/Trunk Assessment RUE Assessment RUE Assessment: Within Functional Limits Active Range of Motion (AROM) Comments: WFL General Strength Comments: 3/5 MMT LUE Assessment Passive Range of Motion (PROM) Comments: WFL Active Range of Motion (AROM) Comments: WFL General Strength Comments: 3/5MMT  Care Tool Care Tool Self Care Eating   Eating Assist Level: Set up assist    Oral Care    Oral Care Assist Level: Set up assist    Bathing   Body parts bathed by patient: Right arm;Left arm;Chest;Abdomen;Front perineal area;Buttocks;Right upper leg;Left upper leg;Right lower leg;Left lower leg;Face     Assist Level: Supervision/Verbal cueing    Upper Body Dressing(including orthotics)   What is the patient wearing?: Pull over shirt;Hospital gown only   Assist Level: Contact Guard/Touching assist (CGA to MinA)    Lower Body Dressing (excluding footwear)   What is the patient wearing?: Pants Assist for lower body dressing: Contact Guard/Touching assist    Putting on/Taking off footwear     Assist for footwear: Contact Guard/Touching assist        Care Tool Toileting Toileting activity   Assist for toileting: Contact Guard/Touching assist     Care Tool Bed Mobility Roll left and right activity   Roll left and right assist level: Supervision/Verbal cueing    Sit to lying activity   Sit to lying assist level: Supervision/Verbal cueing    Lying to sitting on side of bed activity   Lying to sitting on side of bed assist level: the ability to move from lying on the back to sitting on the side of the bed with no back support.: Supervision/Verbal cueing     Care Tool Transfers Sit to stand transfer   Sit to stand assist level: Contact Guard/Touching assist (CGA to MinA)    Chair/bed transfer   Chair/bed transfer assist level: Contact Guard/Touching assist     Toilet transfer   Assist Level: Contact Guard/Touching assist (CGA to MinA)     Care Tool Cognition  Expression of Ideas and Wants Expression of Ideas and Wants: 2. Frequent difficulty - frequently exhibits difficulty with expressing needs and ideas  Understanding Verbal and Non-Verbal Content Understanding Verbal and Non-Verbal Content: 3. Usually understands - understands most conversations, but misses some part/intent of message. Requires cues at times to understand   Memory/Recall Ability     Refer to Care Plan for Long Term Goals  SHORT TERM GOAL WEEK 1 OT Short Term Goal 1 (Week 1): The pt will safely  complete UB/LB  bathing / dressing with ModI incorporating AE as needed. OT Short Term Goal 2 (Week 1): The pt will safely transfer to all surfaces with ModI incorporating AE as needed. OT Short Term Goal 3 (Week 1): The pt will  demonstrate good safety awareness while completing a simple home making task at Mod I using AE as needed. OT Short Term Goal 4 (Week 1): The pt will complete a cognitive activity with accuracy 3 out of  5 attempt with minimal cues following  demonstration and verbal instruction.  Recommendations for other services: None    Skilled  Therapeutic Intervention Patient seen this day for Occupatonal Therapy evaluation to improve upon functional ind for a safe return to home.  The pt was in good spirits with no c/o pain at the time of the visit. The pt reported being able to sleep during the night.  The pt presents with some challenges in relation to expressive language, however, she is motivated to improve her outcome verbally, cognitively, and physically.  The pt has the support of her daughter, her daughter's spouse and her twin grandsons. At the time of the evaluation the pt was able to come from sit to stand with CGA using the RW, she was able to ambulate for function to the restroom with CGA using the RW. The pt was able to doff and donn UB/LB clothing item with CGA to MinA secondary to challenges with coordination  and generalize weakness. The pt was able to bathe at the same LOF.   The pt was able to donn and doff her socks and shoes with Ind LOF and additional time. The pt shares some simple home making task with her daughter and she expressed a desire to work on simple home making task to reduce the burden of care for the care provider.  Based on the patient's current functional status, I am recommending  5-7 day of Occupational Therapy with the current objectives in place with modification in the plan as needed.  ADL ADL Equipment Provided: Other (comment) Eating: Set up Where Assessed-Eating: Chair Grooming: Setup Where Assessed-Grooming: Chair Upper Body Bathing: Setup Where Assessed-Upper Body Bathing: Shower Lower Body Bathing: Setup Where Assessed-Lower Body Bathing: Shower Upper Body Dressing: Setup Where Assessed-Upper Body Dressing: Wheelchair Lower Body Dressing: Setup Where Assessed-Lower Body Dressing: Wheelchair Toileting: Setup Where Assessed-Toileting: Teacher, adult education: Furniture conservator/restorer Method: Proofreader: Other (comment);Grab bars Tub/Shower Transfer Method:  Ship broker: Information systems manager with back;Grab Proofreader: Administrator, arts Method: Designer, industrial/product: Shower seat with back Mobility  Bed Mobility Bed Mobility: Rolling Right;Rolling Left;Supine to Sit;Sit to Supine Rolling Right: Supervision/verbal cueing Rolling Left: Supervision/Verbal cueing Supine to Sit: Supervision/Verbal cueing Sit to Supine: Supervision/Verbal cueing Transfers Sit to Stand: Contact Guard/Touching assist;Minimal Assistance - Patient > 75% Stand to Sit: Contact Guard/Touching assist;Minimal Assistance - Patient > 75%   Discharge Criteria: Patient will be discharged from OT if patient refuses treatment 3 consecutive times without medical reason, if treatment goals not met, if there is a change in medical status, if patient makes no progress towards goals or if patient is discharged from hospital.  The above assessment, treatment plan, treatment alternatives and goals were discussed and mutually agreed upon: by patient  [name] 04/18/2024, 5:24 PM

## 2024-04-18 NOTE — Evaluation (Signed)
 Physical Therapy Assessment and Plan  Patient Details  Name: Betty Golden MRN: [id number] Date of Birth: Sep 22, 1945  PT Diagnosis: Difficulty walking, Impaired cognition, Impaired sensation, and Muscle weakness Rehab Potential: Good ELOS: 5 to 7 days   Today's Date: 04/18/2024 PT Individual Time: 0800-0917 PT Individual Time Calculation (min): 77 min    Hospital Problem: Principal Problem:   Acute ischemic left middle cerebral artery (MCA) stroke (HCC)   Past Medical History:  Past Medical History:  Diagnosis Date   Asthma    C. difficile colitis    Cervical stenosis of spine    Chronic headaches    Chronic neck pain    Coronary artery disease    Depression    Diverticulosis    Essential hypertension    Fibromyalgia    GERD (gastroesophageal reflux disease)    History of Salmonella gastroenteritis    HNP (herniated nucleus pulposus), lumbar    Hyperlipidemia    IBS (irritable bowel syndrome)    Lung nodule    rheumatoid arthritis    Past Surgical History:  Past Surgical History:  Procedure Laterality Date   APPENDECTOMY  1965   BIOPSY  08/26/2018   Procedure: BIOPSY;  Surgeon: Golda Claudis PENNER, MD;  Location: [facility];  Service: Endoscopy;;  gastric   BREAST BIOPSY     BREAST REDUCTION SURGERY  1994   CATARACT EXTRACTION W/PHACO Left 06/09/2023   Procedure: CATARACT EXTRACTION PHACO AND INTRAOCULAR LENS PLACEMENT (IOC);  Surgeon: Harrie Agent, MD;  Location: [facility];  Service: Ophthalmology;  Laterality: Left;  CDE 7.03   CATARACT EXTRACTION W/PHACO Right 06/23/2023   Procedure: CATARACT EXTRACTION PHACO AND INTRAOCULAR LENS PLACEMENT (IOC);  Surgeon: Harrie Agent, MD;  Location: [facility];  Service: Ophthalmology;  Laterality: Right;  CDE: 11.55   CHOLECYSTECTOMY  1975   COLONOSCOPY N/A 08/19/2013   Procedure: COLONOSCOPY;  Surgeon: Claudis PENNER Golda, MD;  [facility]: [facility];  Service: Endoscopy;  Laterality: N/A;  155-moved to 140 Ann to notify pt    COLONOSCOPY N/A 08/26/2018   Procedure: COLONOSCOPY;  Surgeon: Golda Claudis PENNER, MD;  [facility]: [facility];  Service: Endoscopy;  Laterality: N/A;   ESOPHAGOGASTRODUODENOSCOPY N/A 08/26/2018   Procedure: ESOPHAGOGASTRODUODENOSCOPY (EGD);  Surgeon: Golda Claudis PENNER, MD;  Location: [facility];  Service: Endoscopy;  Laterality: N/A;   fibromyalgia     LUMBAR LAMINECTOMY/DECOMPRESSION MICRODISCECTOMY Bilateral 09/20/2015   Procedure: MICRO LUMBAR DECOMPRESSION L5-S1 BILATERAL    (1 LEVEL);  Surgeon: Reyes Billing, MD;  Location: [facility];  Service: Orthopedics;  Laterality: Bilateral;   rheumatoid arthritis     Sinus Surgery     TONSILLECTOMY  1968    Assessment & Plan Clinical Impression: Patient is a 78 year old female with history of COPD, RA, FM, cervical stenosis w/chronic pain,  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 She was found to have leucocytosis WBC 18.7, hypokalemia K-2.6 and was moaning with abdominal tenderness at admission. CT head  showed suspicion of recent right parietal infarct. CT A/P showed mild colonic diverticulitis with descending and sigmoid wall thickening question colitis. She was started on IV antibiotics and treated with fluid bolus. MRI attempted but patient had panic attack so aborted. CTA head/neck showed new occlusion of proximal M2 branch of L-MCA  new since 02/28/24 and diminished number/caliber of vessels c/w right.    Dr. Voncile consulted and recommended DAPT for left parietal stroke due to occluded L-MCA distribution. MRI recommended for work up but patient declined repeat attempt. She was noted to have mild left sided weakness with aphasia and therapy consulted.  Neurology recommending ASA 81 mg and Plavix  75 mg daily for 3 months  followed by aspirin  alone.  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 She was independent PTA and CIR recommended due to functional decline.     Patient transferred to CIR on 04/17/2024 .   Patient currently requires min with mobility secondary to muscle weakness, decreased cardiorespiratoy endurance, decreased coordination, decreased attention to left, and decreased awareness, decreased safety awareness, and decreased memory.  Prior to hospitalization, patient was modified independent  with mobility and lived with Family (Daughter, her husband, and their twin sons) in a House home.  Home access is 1 stair to the garage with 1/2 rise to entrance is her best option oppose to front entranceStairs to enter.  Patient will benefit from skilled PT intervention to maximize safe functional mobility, minimize fall risk, and decrease caregiver burden for planned discharge home with 24 hour supervision.  Anticipate patient will benefit from follow up HH at discharge.  PT - End of Session Activity Tolerance: Tolerates 30+ min activity with multiple rests Endurance Deficit: Yes PT Assessment Rehab Potential (ACUTE/IP ONLY): Good PT Barriers to Discharge: Inaccessible home environment PT Barriers to Discharge Comments: garage entrance 1 step then 1 1/2 sil into house PT Patient demonstrates impairments in the following area(s): Balance;Endurance;Perception;Sensory;Safety;Motor PT Transfers Functional Problem(s): Bed Mobility;Bed to Chair;Car PT Locomotion Functional Problem(s): Ambulation;Stairs PT Plan PT Intensity: Minimum of 1-2 x/day ,45 to 90 minutes PT Frequency: 5 out of 7 days PT Duration Estimated Length of Stay: 5 to 7 days PT Treatment/Interventions: Ambulation/gait training;Neuromuscular re-education;Stair training;UE/LE  Strength taining/ROM;Balance/vestibular training;Discharge planning;Therapeutic Activities;UE/LE Coordination activities;Functional mobility training;Patient/family education;Therapeutic Exercise PT Transfers Anticipated Outcome(s): S transfers PT Locomotion Anticipated Outcome(s): S gait , contact gaurd stairs PT Recommendation Recommendations for Other Services: None Follow Up Recommendations: Home health PT Patient destination: Home Equipment Recommended: To be determined   PT Evaluation Precautions/Restrictions Precautions Precautions: Fall Recall of Precautions/Restrictions: Impaired Restrictions Weight Bearing Restrictions Per Provider Order: No General Chart Reviewed: Yes Family/Caregiver Present: No  Pain Pain Assessment Pain Scale: 0-10 Pain Score: 0-No pain Pain Interference Pain Interference Pain Effect on Sleep: 2. Occasionally Pain Interference with Therapy Activities: 2. Occasionally Pain Interference with Day-to-Day Activities: 2. Occasionally Home Living/Prior Functioning Home Living Available Help at Discharge: Family;Available 24 hours/day (daughter, daughter husband, and twin grandsons) Type of Home: House Home Access: Stairs to enter [employer] of Steps: 1 stair to the garage with 1/2 rise to entrance is her best option oppose to front entrance Entrance Stairs-Rails: Right (full rail on R) Home Layout:  (3 level home) Bathroom Shower/Tub: Walk-in shower (in her area with no seat of grab bars) Bathroom Toilet: Standard Bathroom Accessibility: Yes  Lives With: Family (Daughter, her husband, and their twin sons) Prior Function Level of Independence: Independent with basic ADLs;Independent with gait  Able to Take Stairs?: Yes Leisure: Hobbies-yes (Comment) (puzzle) Vision/Perception  Perception Perception: Impaired Preception Impairment Details: Inattention/Neglect Perception-Other Comments: mild L inattention  Cognition Overall Cognitive  Status: Impaired/Different from baseline Arousal/Alertness: Awake/alert Orientation  Level: Oriented to person;Oriented to place;Oriented to situation (difficult to assess due to mod expressive aphasia and mild receptive aphasia) Year: 2025 (ot initially said 2022 however before therapist could correct pt said no its 2025) Month: September (pt initially said October however before therapist could correct pt said no its september) Day of Week: Incorrect Memory: Impaired Memory Impairment: Decreased recall of new information;Retrieval deficit Problem Solving: Impaired Safety/Judgment: Impaired Comments: pt follows simple one step commands, increased difficulty with multistep commands Sensation Sensation Light Touch: Impaired Detail Light Touch Impaired Details: Impaired LLE;Impaired RLE Proprioception: Impaired Detail Proprioception Impaired Details: Impaired RLE;Impaired LLE Coordination Gross Motor Movements are Fluid and Coordinated: No Coordination and Movement Description: generalized weakness L weaker than R Motor  Motor Motor - Skilled Clinical Observations: generalized weakness L greater than R LE  Mobility Bed Mobility Bed Mobility: Rolling Right;Rolling Left;Supine to Sit;Sit to Supine Rolling Right: Supervision/verbal cueing Rolling Left: Supervision/Verbal cueing Supine to Sit: Supervision/Verbal cueing Sit to Supine: Supervision/Verbal cueing Transfers Transfers: Sit to Stand;Stand to Sit;Stand Pivot Transfers Sit to Stand: Contact Guard/Touching assist;Minimal Assistance - Patient > 75% Stand to Sit: Contact Guard/Touching assist;Minimal Assistance - Patient > 75% Stand Pivot Transfers: Contact Guard/Touching assist;Minimal Assistance - Patient > 75% Transfer (Assistive device): Rolling walker Locomotion Gait Ambulation: Yes Gait Assistance: Minimal Assistance - Patient > 75%;Contact Guard/Touching assist Gait Distance (Feet): 150 Feet Assistive device: Rolling  walker Gait Gait: Yes Gait velocity: decreased Stairs / Additional Locomotion Stairs: Yes Stairs Assistance: Minimal Assistance - Patient > 75%;Contact Guard/Touching assist Stair Management Technique: Two rails Number of Stairs: 4 Height of Stairs: 6 Wheelchair Mobility Wheelchair Mobility: Yes Wheelchair Assistance: Doctor, general practice: Both upper extremities Distance: 150 Trunk/Postural Assessment  Cervical Assessment Cervical Assessment: Exceptions to [facility] (forward head posture) Thoracic Assessment Thoracic Assessment: Exceptions to [facility] (rounded shoulders) Lumbar Assessment Lumbar Assessment: Exceptions to [facility] Postural Control Postural Control: Deficits on evaluation (delayed)  Balance Balance Balance Assessed: Yes Static Sitting Balance Static Sitting - Balance Support: Bilateral upper extremity supported Static Sitting - Level of Assistance: 5: Stand by assistance Static Standing Balance Static Standing - Balance Support: Bilateral upper extremity supported;During functional activity Static Standing - Level of Assistance: 4: Min assist Dynamic Standing Balance Dynamic Standing - Balance Support: Bilateral upper extremity supported;During functional activity Dynamic Standing - Level of Assistance: 4: Min assist Extremity Assessment  B UEs as per OT evaluation. RLE Assessment RLE Assessment: Exceptions to [facility] Passive Range of Motion (PROM) Comments: WFLs General Strength Comments: grossly 3/5 LLE Assessment LLE Assessment: Exceptions to [facility] Passive Range of Motion (PROM) Comments: WFLs General Strength Comments: grossly 3-/5 to 3/5  Care Tool Care Tool Bed Mobility Roll left and right activity   Roll left and right assist level: Supervision/Verbal cueing    Sit to lying activity   Sit to lying assist level: Supervision/Verbal cueing    Lying to sitting on side of bed activity   Lying to sitting on side of bed assist level: the  ability to move from lying on the back to sitting on the side of the bed with no back support.: Supervision/Verbal cueing     Care Tool Transfers Sit to stand transfer   Sit to stand assist level: Minimal Assistance - Patient > 75%    Chair/bed transfer   Chair/bed transfer assist level: Minimal Assistance - Patient > 75%    Car transfer   Car transfer assist level: Minimal Assistance - Patient > 75%      Care  Tool Locomotion Ambulation   Assist level: Minimal Assistance - Patient > 75%   Max distance: 150  Walk 10 feet activity   Assist level: Minimal Assistance - Patient > 75% Assistive device: Walker-rolling   Walk 50 feet with 2 turns activity   Assist level: Minimal Assistance - Patient > 75% Assistive device: Walker-rolling  Walk 150 feet activity   Assist level: Minimal Assistance - Patient > 75% Assistive device: Walker-rolling  Walk 10 feet on uneven surfaces activity Walk 10 feet on uneven surfaces activity did not occur: Safety/medical concerns      Stairs   Assist level: Minimal Assistance - Patient > 75% Stairs assistive device: 2 hand rails Max number of stairs: 4  Walk up/down 1 step activity   Walk up/down 1 step (curb) assist level: Minimal Assistance - Patient > 75% Walk up/down 1 step or curb assistive device: 2 hand rails  Walk up/down 4 steps activity   Walk up/down 4 steps assist level: Minimal Assistance - Patient > 75% Walk up/down 4 steps assistive device: 2 hand rails  Walk up/down 12 steps activity Walk up/down 12 steps activity did not occur: Safety/medical concerns      Pick up small objects from floor Pick up small object from the floor (from standing position) activity did not occur: Safety/medical concerns      Wheelchair Is the patient using a wheelchair?: No Type of Wheelchair: Manual   Wheelchair assist level: Supervision/Verbal cueing Max wheelchair distance: 150  Wheel 50 feet with 2 turns activity   Assist Level:  Supervision/Verbal cueing  Wheel 150 feet activity   Assist Level: Supervision/Verbal cueing    Refer to Care Plan for Long Term Goals  SHORT TERM GOAL WEEK 1 PT Short Term Goal 1 (Week 1): STGs = LTGs  Recommendations for other services: None   Skilled Therapeutic Intervention PT evaluation completed and treatment plan initiated. Pt initially required min A for transfers however progressed throughout treatment and was transferring with contact guard and rolling walker. Pt ambulated 50 feet with rolling walker and contact guard to min A with verbal cues. Pt ambulated with B flexed knees L worse than R, with decreased step length B. Pt returned to room following treatment and left sitting up in recliner with chair alarm and all needs within reach.    Discharge Criteria: Patient will be discharged from PT if patient refuses treatment 3 consecutive times without medical reason, if treatment goals not met, if there is a change in medical status, if patient makes no progress towards goals or if patient is discharged from hospital.  The above assessment, treatment plan, treatment alternatives and goals were discussed and mutually agreed upon: by patient  Merilee Lynwood MATSU 04/18/2024, 12:52 PM

## 2024-04-18 NOTE — Plan of Care (Signed)
  Problem: Consults Goal: RH STROKE PATIENT EDUCATION Description: See Patient Education module for education specifics  Outcome: Progressing   Problem: RH BOWEL ELIMINATION Goal: RH STG MANAGE BOWEL WITH ASSISTANCE Description: STG Manage Bowel with mod I Assistance. Outcome: Progressing   Problem: RH SAFETY Goal: RH STG ADHERE TO SAFETY PRECAUTIONS W/ASSISTANCE/DEVICE Description: STG Adhere to Safety Precautions With cues Assistance/Device. Outcome: Progressing   Problem: RH PAIN MANAGEMENT Goal: RH STG PAIN MANAGED AT OR BELOW PT'S PAIN GOAL Description: Pain < 4 with prns Outcome: Progressing   Problem: RH KNOWLEDGE DEFICIT Goal: RH STG INCREASE KNOWLEDGE OF HYPERTENSION Description: Patient will be able to manage HTN using educational resources for medications and dietary modification independently Outcome: Progressing Goal: RH STG INCREASE KNOWLEGDE OF HYPERLIPIDEMIA Description: Patient will be able to manage HLD using educational resources for medications and dietary modification independently Outcome: Progressing Goal: RH STG INCREASE KNOWLEDGE OF STROKE PROPHYLAXIS Description: Patient will be able to manage secondary using educational resources for medications and dietary modification independently Outcome: Progressing   Problem: Consults Goal: RH STROKE PATIENT EDUCATION Description: See Patient Education module for education specifics  Outcome: Progressing

## 2024-04-19 DIAGNOSIS — G8191 Hemiplegia, unspecified affecting right dominant side: Secondary | ICD-10-CM

## 2024-04-19 DIAGNOSIS — I69320 Aphasia following cerebral infarction: Secondary | ICD-10-CM

## 2024-04-19 DIAGNOSIS — I63512 Cerebral infarction due to unspecified occlusion or stenosis of left middle cerebral artery: Secondary | ICD-10-CM | POA: Diagnosis not present

## 2024-04-19 LAB — BASIC METABOLIC PANEL WITH GFR
Anion gap: 10 (ref 5–15)
BUN: 9 mg/dL (ref 8–23)
CO2: 20 mmol/L — ABNORMAL LOW (ref 22–32)
Calcium: 9.3 mg/dL (ref 8.9–10.3)
Chloride: 110 mmol/L (ref 98–111)
Creatinine, Ser: 1.1 mg/dL — ABNORMAL HIGH (ref 0.44–1.00)
GFR, Estimated: 52 mL/min — ABNORMAL LOW (ref >60)
Glucose, Bld: 83 mg/dL (ref 70–99)
Potassium: 3.5 mmol/L (ref 3.5–5.1)
Sodium: 140 mmol/L (ref 135–145)

## 2024-04-19 LAB — CBC
HCT: 33 % — ABNORMAL LOW (ref 36.0–46.0)
Hemoglobin: 11 g/dL — ABNORMAL LOW (ref 12.0–15.0)
MCH: 28.9 pg (ref 26.0–34.0)
MCHC: 33.3 g/dL (ref 30.0–36.0)
MCV: 86.8 fL (ref 80.0–100.0)
Platelets: 202 K/uL (ref 150–400)
RBC: 3.8 MIL/uL — ABNORMAL LOW (ref 3.87–5.11)
RDW: 14.1 % (ref 11.5–15.5)
WBC: 10.9 K/uL — ABNORMAL HIGH (ref 4.0–10.5)
nRBC: 0 % (ref 0.0–0.2)

## 2024-04-19 MED ORDER — ACETAMINOPHEN 325 MG PO TABS: 325.0000 mg | ORAL_TABLET | ORAL | Status: AC | PRN

## 2024-04-19 NOTE — Discharge Instructions (Addendum)
 Inpatient Rehab Discharge Instructions  Betty Golden [facility] Discharge date and time:    Activities/Precautions/ Functional Status: Activity: no lifting, driving, or strenuous exercise  till cleared by MD Diet: cardiac diet Wound Care: none needed Functional status:  ___ No restrictions     ___ Walk up steps independently ___ 24/7 supervision/assistance   ___ Walk up steps with assistance ___ Intermittent supervision/assistance  ___ Bathe/dress independently ___ Walk with walker     ___ Bathe/dress with assistance ___ Walk Independently    ___ Shower independently ___ Walk with assistance    __X_ Shower with assistance _X__ No alcohol     ___ Return to work/school ________   Special Instructions:   COMMUNITY REFERRALS UPON DISCHARGE:    Home Health:   PT    OT     SP                Agency:[facility]   Phone:[phone]   Medical Equipment/Items Ordered:HAS ALL NEEDED EQUIPMENT                                                 Agency/Supplier:NA   STROKE/TIA DISCHARGE INSTRUCTIONS SMOKING Cigarette smoking nearly doubles your risk of having a stroke & is the single most alterable risk factor  If you smoke or have smoked in the last 12 months, you are advised to quit smoking for your health. Most of the excess cardiovascular risk related to smoking disappears within a year of stopping. Ask you doctor about anti-smoking medications [place] Quit Line: 1-800-QUIT NOW Free Smoking Cessation Classes [phone]  CHOLESTEROL Know your levels; limit fat & cholesterol in your diet  Lipid Panel     Component Value Date/Time   CHOL 175 04/14/2024 0448   TRIG 87 04/14/2024 0448   HDL 60 04/14/2024 0448   CHOLHDL 2.9 04/14/2024 0448   VLDL 17 04/14/2024 0448   LDLCALC 98 04/14/2024 0448     Many patients benefit from treatment even if their cholesterol is at goal. Goal: Total Cholesterol (CHOL) less than 160 Goal:  Triglycerides (TRIG) less than 150 Goal:  HDL greater than  40 Goal:  LDL (LDLCALC) less than 100   BLOOD PRESSURE American Stroke Association blood pressure target is less that 120/80 mm/Hg  Your discharge blood pressure is:  BP: (!) 152/84 Monitor your blood pressure Limit your salt and alcohol intake Many individuals will require more than one medication for high blood pressure  DIABETES (A1c is a blood sugar average for last 3 months) Goal HGBA1c is under 7% (HBGA1c is blood sugar average for last 3 months)  Diabetes: No known diagnosis of diabetes    Lab Results  Component Value Date   HGBA1C 4.7 (L) 04/13/2024    Your HGBA1c can be lowered with medications, healthy diet, and exercise. Check your blood sugar as directed by your physician Call your physician if you experience unexplained or low blood sugars.  PHYSICAL ACTIVITY/REHABILITATION Goal is 30 minutes at least 4 days per week  Activity: No driving, Therapies: see above Return to work: N/A Activity decreases your risk of heart attack and stroke and makes your heart stronger.  It helps control your weight and blood pressure; helps you relax and can improve your mood. Participate in a regular exercise program. Talk with your doctor about the best form of exercise for you (  dancing, walking, swimming, cycling).  DIET/WEIGHT Goal is to maintain a healthy weight  Your discharge diet is:  Diet Order             Diet Heart Room service appropriate? Yes; Fluid consistency: Thin  Diet effective now                   liquids Your height is:  Height: 5' 3 (160 cm) Your current weight is: 152 lbs Your Body Mass Index (BMI) is:  BMI (Calculated): 26.99 Following the type of diet specifically designed for you will help prevent another stroke. Your goal weight  is:  141 lbs Your goal Body Mass Index (BMI) is 19-24. Healthy food habits can help reduce 3 risk factors for stroke:  High cholesterol, hypertension, and excess weight.  RESOURCES Stroke/Support Group:  Call [phone]    STROKE EDUCATION PROVIDED/REVIEWED AND GIVEN TO PATIENT Stroke warning signs and symptoms How to activate emergency medical system (call 911). Medications prescribed at discharge. Need for follow-up after discharge. Personal risk factors for stroke. Pneumonia vaccine given:  Flu vaccine given:  My questions have been answered, the writing is legible, and I understand these instructions.  I will adhere to these goals & educational materials that have been provided to me after my discharge from the hospital.     My questions have been answered and I understand these instructions. I will adhere to these goals and the provided educational materials after my discharge from the hospital.  Patient/Caregiver Signature _______________________________ Date __________  Clinician Signature _______________________________________ Date __________  Please bring this form and your medication list with you to all your follow-up doctor's appointments.

## 2024-04-19 NOTE — Progress Notes (Signed)
 Patient ID: MATTIA OSTERMAN, female   DOB: Sep 24, 1945, 78 y.o.   MRN: [id number] Met with the patient to review current situation, rehab program, team conference and plan of care. Conversation limited by aphasia/word finding difficulties and STMD but able to follow along. Denies bowel issues recent colitis. Reviewed secondary risk management including HTN, CAD with remote CVA in  August with blurred vision for 24 hours.  Discussed HH diet with  medications for HTN and addition of Crestor . Continue to follow along to address educational needs to facilitate preparation for discharge. [name]

## 2024-04-19 NOTE — Plan of Care (Signed)
  Problem: RH Swallowing Goal: LTG Patient will consume least restrictive diet using compensatory strategies with assistance (SLP) Description: LTG:  Patient will consume least restrictive diet using compensatory strategies with assistance (SLP) Flowsheets (Taken 04/19/2024 1116) LTG: Pt Patient will consume least restrictive diet using compensatory strategies with assistance of (SLP): Modified Independent   Problem: RH Comprehension Communication Goal: LTG Patient will comprehend basic/complex auditory (SLP) Description: LTG: Patient will comprehend basic/complex auditory information with cues (SLP). Flowsheets (Taken 04/19/2024 1116) LTG: Patient will comprehend: Complex auditory information LTG: Patient will comprehend auditory information with cueing (SLP): Minimal Assistance - Patient > 75%   Problem: RH Expression Communication Goal: LTG Patient will verbally express basic/complex needs(SLP) Description: LTG:  Patient will verbally express basic/complex needs, wants or ideas with cues  (SLP) Flowsheets (Taken 04/19/2024 1116) LTG: Patient will verbally express basic/complex needs, wants or ideas (SLP): Minimal Assistance - Patient > 75% Goal: LTG Patient will increase word finding of common (SLP) Description: LTG:  Patient will increase word finding of common objects/daily info/abstract thoughts with cues using compensatory strategies (SLP). Flowsheets (Taken 04/19/2024 1116) LTG: Patient will increase word finding of common (SLP): Minimal Assistance - Patient > 75% Patient will use compensatory strategies to increase word finding of:  Daily info  Common objects   Problem: RH Attention Goal: LTG Patient will demonstrate this level of attention during functional activites (SLP) Description: LTG:  Patient will will demonstrate this level of attention during functional activites (SLP) Flowsheets (Taken 04/19/2024 1116) Patient will demonstrate during cognitive/linguistic activities the  attention type of: Sustained Patient will demonstrate this level of attention during cognitive/linguistic activities in: Controlled LTG: Patient will demonstrate this level of attention during cognitive/linguistic activities with assistance of (SLP): Minimal Assistance - Patient > 75% Number of minutes patient will demonstrate attention during cognitive/linguistic activities: 10

## 2024-04-19 NOTE — Progress Notes (Signed)
 PROGRESS NOTE   Subjective/Complaints:  Slept poorly but denies pain or bladder /bowel issues     ROS: as per HPI. Denies CP, SOB, abd pain, N/V/D/C, or any other complaints at this time.    Objective:   No results found. Recent Labs    04/18/24 0655 04/19/24 0504  WBC 13.6* 10.9*  HGB 12.8 11.0*  HCT 39.1 33.0*  PLT 216 202   Recent Labs    04/18/24 0655 04/19/24 0504  NA 140 140  K 3.8 3.5  CL 110 110  CO2 19* 20*  GLUCOSE 93 83  BUN 9 9  CREATININE 1.13* 1.10*  CALCIUM  9.7 9.3        Intake/Output Summary (Last 24 hours) at 04/19/2024 0750 Last data filed at 04/18/2024 1817 Gross per 24 hour  Intake 596 ml  Output --  Net 596 ml        Physical Exam: Vital Signs Blood pressure 136/74, pulse 99, temperature 98.2 F (36.8 C), temperature source Oral, resp. rate 18, height 5' 3 (1.6 m), weight 69.1 kg, SpO2 98%.   General: No acute distress Mood and affect are appropriate Heart: Regular rate and rhythm no rubs murmurs or extra sounds Lungs: Clear to auscultation, breathing unlabored, no rales or wheezes Abdomen: Positive bowel sounds, soft nontender to palpation, nondistended Extremities: No clubbing, cyanosis, or edema Skin: No evidence of breakdown, no evidence of rash Neurologic: Cranial nerves II through XII intact,Sensory exam difficult to assess due to aphasia  Cerebellar exam normal finger to nose to finger  Musculoskeletal: Full range of motion in all 4 extremities. No joint swelling    Neuro: Mental Status: AAOx to day, for month before October, for year able to get with choices, unable to say her name   Speech/Languate: Aphasia more expressive than receptive, cannot state day and date due to naming issues       MOTOR: RUE: 4/5 Deltoid, 4/5 Biceps, 4/5 Triceps,4/5 Grip LUE: 4/5 Deltoid, 4/5 Biceps, 4/5 Triceps, 4/5 Grip RLE: HF 4/5, KE 4/5, ADF 4/5, APF 5/5 LLE: HF 4-/5, KE  4-/5, ADF 4/5, APF 5/5     REFLEXES: No ankle clonus   SENSORY: Normal to touch all 4 extremities   Coordination: Normal finger to nose bilaterally  Assessment/Plan: 1. Functional deficits which require 3+ hours per day of interdisciplinary therapy in a comprehensive inpatient rehab setting. Physiatrist is providing close team supervision and 24 hour management of active medical problems listed below. Physiatrist and rehab team continue to assess barriers to discharge/monitor patient progress toward functional and medical goals  Care Tool:  Bathing    Body parts bathed by patient: Right arm, Left arm, Chest, Abdomen, Front perineal area, Buttocks, Right upper leg, Left upper leg, Right lower leg, Left lower leg, Face         Bathing assist Assist Level: Supervision/Verbal cueing     Upper Body Dressing/Undressing Upper body dressing   What is the patient wearing?: Pull over shirt, Hospital gown only    Upper body assist Assist Level: Contact Guard/Touching assist (CGA to MinA)    Lower Body Dressing/Undressing Lower body dressing      What is the  patient wearing?: Pants     Lower body assist Assist for lower body dressing: Contact Guard/Touching assist     Toileting Toileting    Toileting assist Assist for toileting: Contact Guard/Touching assist     Transfers Chair/bed transfer  Transfers assist     Chair/bed transfer assist level: Contact Guard/Touching assist     Locomotion Ambulation   Ambulation assist      Assist level: Contact Guard/Touching assist Assistive device: Walker-rolling Max distance: 150   Walk 10 feet activity   Assist     Assist level: Contact Guard/Touching assist Assistive device: Walker-rolling   Walk 50 feet activity   Assist    Assist level: Contact Guard/Touching assist Assistive device: Walker-rolling    Walk 150 feet activity   Assist    Assist level: Contact Guard/Touching assist Assistive  device: Walker-rolling    Walk 10 feet on uneven surface  activity   Assist Walk 10 feet on uneven surfaces activity did not occur: Safety/medical concerns         Wheelchair     Assist Is the patient using a wheelchair?: No Type of Wheelchair: Manual    Wheelchair assist level: Supervision/Verbal cueing Max wheelchair distance: 150    Wheelchair 50 feet with 2 turns activity    Assist        Assist Level: Supervision/Verbal cueing   Wheelchair 150 feet activity     Assist      Assist Level: Supervision/Verbal cueing   Blood pressure 136/74, pulse 99, temperature 98.2 F (36.8 C), temperature source Oral, resp. rate 18, height 5' 3 (1.6 m), weight 69.1 kg, SpO2 98%.  Medical Problem List and Plan: 1. 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  Patient did not tolerate MRI             -patient may shower             -ELOS/Goals: 10-12 days, PT/OT/SLP supervision             -Admit to CIR 2.  Antithrombotics: -DVT/anticoagulation:  Pharmaceutical: Lovenox  40mg  daily -antiplatelet therapy: DAPT X 3 months followed by ASA alone.  Discharge summary and decubitus.  Discharge summary indicated aspirin  and Plavix  for 21 days in 1 location, message hospitalist to confirm 3 months 3. Pain Management:  Tylenol  prn.  4. Mood/Behavior/Sleep: LCSW to follow for evaluation and support --trazodone  25 mg at bedtime. Ativan  prn for anxiety/agitation due to aphasia?   -04/18/24 still having trouble sleeping-- scheduled melatonin 5mg              -antipsychotic agents: has had haldol  prn--last used on 09/18. Not reordered -DC summary indicates cymbalta  60mg  daily, does not appear she got this at the acute hospital. Pt states she uses 30mg  BID-- reordered 5. Neuropsych/cognition: This patient may be capable of making decisions on her own behalf. 6. Skin/Wound Care: Routine pressure relief measures.             -- Monitor  skin integrity 7. Fluids/Electrolytes/Nutrition: Monitor I/O. Routine labs. Vitamins/supplements.  -04/18/24 CMP with mildly elevated LFTs (AST 77/ALT 64), and mildly bumped Cr 1.13-- monitor repeat LFTs on Thurs, BMP tomorrow.  8. Diverticulitis/Colitis: Transitioned to cipro  500mg  bid and Flagyl  500 tid orally on 9/18, IV abx from 9/15-9/18 -WBC trending down today but also had drop in H/H. Monitor for signs of bleeding.               - Follow-up  with gastroenterologist outpatient -04/18/24 unclear how many days of PO abx were indicated-- had 3 days IV, looks like PO were ordered for 3 days on acute? But that would only be 6d total-- WBC today a little up at 13.6   -will leave PO abx on for now   -consider stopping abx 9/22-25 for full 7-10 days of tx 9. HTN: Monitor BP TID--avoid hypoperfusion. BP meds have been resumed.  Monitor with activity. --On amlodipine  10mg  daily -04/18/24 BP variable, leave meds as is for now, monitor  Vitals:   04/17/24 1712 04/17/24 1954 04/18/24 0336 04/18/24 1228  BP: 99/85 (!) 145/98 (!) 161/92 (!) 143/66   04/18/24 1419 04/18/24 2011 04/19/24 0535  BP: (!) 160/78 (!) 158/70 136/74    10. COPD: On Breztri  bid with Fasenra  every 8 weeks. 11. RA: Stable on Leflunomide  20mg  daily.  12. Hypokalemia: Being supplemented daily but still low -04/18/24 K 3.8, monitor routine labs 13. Hypomagnesemia: 1.8 on recheck 9/21, monitor as needed 14.  ABLA: Monitor for rectal bleeding. Hgb down from 12.8 to 11.2. Back up to 13.0 on 9/20-- 12.8 9/21, monitor routinely     Latest Ref Rng & Units 04/19/2024    5:04 AM 04/18/2024    6:55 AM 04/17/2024    5:26 AM  CBC  WBC 4.0 - 10.5 K/uL 10.9  13.6  12.0   Hemoglobin 12.0 - 15.0 g/dL 88.9  87.1  86.9   Hematocrit 36.0 - 46.0 % 33.0  39.1  40.1   Platelets 150 - 400 K/uL 202  216  219    Check stool OB, repeat H and H in am  15, Delirium: Resolving but needed ativan   16. Leukocytosis: 12.0 on 9/20, see #8 17. HLD: continue  rosuvastatin  20mg  daily 18. GERD: protonix  40mg  daily.     LOS: 2 days A FACE TO FACE EVALUATION WAS PERFORMED  Prentice FORBES Compton 04/19/2024, 7:50 AM

## 2024-04-19 NOTE — Progress Notes (Signed)
 Occupational Therapy Session Note  Patient Details  Name: Betty Golden MRN: [id number] Date of Birth: 08/29/1945  Today's Date: 04/19/2024 OT Individual Time: [phone] and 1300 - 1330 OT Individual Time Calculation (min): 60 min and 30 min    Short Term Goals: Week 1:  OT Short Term Goal 1 (Week 1): The pt will safely  complete UB/LB  bathing / dressing with ModI incorporating AE as needed. OT Short Term Goal 2 (Week 1): The pt will safely transfer to all surfaces with ModI incorporating AE as needed. OT Short Term Goal 3 (Week 1): The pt will demonstrate good safety awareness while completing a simple home making task at Mod I using AE as needed. OT Short Term Goal 4 (Week 1): The pt will complete a cognitive activity with accuracy 3 out of  5 attempt with minimal cues following  demonstration and verbal instruction.  Skilled Therapeutic Interventions/Progress Updates:    Visit 1: pain:  no c/o pain  Pt received in bed and agreeable to a shower.  See ADL documentation below.  She did quite well with her mobility and even able to stand at sink for 5-7 min to work on her oral care.  Pt ambulated to bathroom with Rw and completed toileting, shower and dressing.  She only needed CGA due to balance impairments from knee flexion from arthritis and min A to fasten bra and start underwear over R toes.   Pt resting in recliner at end of session with all needs met. Alarm belt on.   Visit 2: Pain: no c/o pain   Pt received in recliner with daughter in room, her daughter confirmed that she is home with her mom and showed me photos of the bathroom. Pt has a walk in shower and will need a shower seat.  Her dtr explained pt has arthritis and fibromyalgia.  Several months ago, pt was using walker but with HHPT progressed  to a cane.   Pt had to use the bathroom and used RW with CGA to ambulate to toilet. Toileted with Supervision.     Had pt walk down the hallway with RW - with daughter observing  to let me know what she notices differently with her gait pattern.  Pt tends to keep her L knee flexed and daughter stated this was new, that she had a smoother gait pattern before this CVA.   Pt returned to room and opted to sit in the recliner. Belt alarm on and all needs met.       Therapy Documentation Precautions:  Precautions Precautions: Fall Recall of Precautions/Restrictions: Impaired Precaution/Restrictions Comments: fall risk and word salad on occassion Restrictions Weight Bearing Restrictions Per Provider Order: No    Vital Signs: Therapy Vitals Temp: 98.2 F (36.8 C) Temp Source: Oral Pulse Rate: 99 Resp: 18 BP: 136/74 Patient Position (if appropriate): Sitting Oxygen Therapy SpO2: 98 % O2 Device: Room Air    ADL: ADL Equipment Provided: Other (comment) Eating: Set up Where Assessed-Eating: Chair Grooming: Setup Where Assessed-Grooming: Chair Upper Body Bathing: Setup Where Assessed-Upper Body Bathing: Shower Lower Body Bathing: Setup Where Assessed-Lower Body Bathing: Shower Upper Body Dressing: Setup Where Assessed-Upper Body Dressing: Wheelchair Lower Body Dressing: Setup Where Assessed-Lower Body Dressing: Wheelchair Toileting: Setup Where Assessed-Toileting: Teacher, adult education: Furniture conservator/restorer Method: Proofreader: Other (comment), Grab bars Tub/Shower Transfer: Scientific laboratory technician Method: Ship broker: Information systems manager with back, Acupuncturist: Administrator, arts Method:  Ambulating Office manager with back  Therapy/Group: Individual Therapy  Phiona Ramnauth 04/19/2024, 8:36 AM

## 2024-04-19 NOTE — Progress Notes (Signed)
 Inpatient Rehabilitation  Patient information reviewed and entered into eRehab system by Burnard Mealing, OTR/L, Rehab Quality Coordinator.   Information including medical coding, functional ability and quality indicators will be reviewed and updated through discharge.

## 2024-04-19 NOTE — Evaluation (Signed)
 Speech Language Pathology Assessment and Plan  Patient Details  Name: Betty Golden MRN: [id number] Date of Birth: 1945/11/18  SLP Diagnosis: Aphasia;Apraxia;Cognitive Impairments;Dysphagia  Rehab Potential: Good ELOS: 5-7 days    Today's Date: 04/19/2024 SLP Individual Time: 8984-8884 SLP Individual Time Calculation (min): 60 min   Hospital Problem: Principal Problem:   Acute ischemic left middle cerebral artery (MCA) stroke (HCC)  Past Medical History:  Past Medical History:  Diagnosis Date   Asthma    C. difficile colitis    Cervical stenosis of spine    Chronic headaches    Chronic neck pain    Coronary artery disease    Depression    Diverticulosis    Essential hypertension    Fibromyalgia    GERD (gastroesophageal reflux disease)    History of Salmonella gastroenteritis    HNP (herniated nucleus pulposus), lumbar    Hyperlipidemia    IBS (irritable bowel syndrome)    Lung nodule    rheumatoid arthritis    Past Surgical History:  Past Surgical History:  Procedure Laterality Date   APPENDECTOMY  1965   BIOPSY  08/26/2018   Procedure: BIOPSY;  Surgeon: Golda Claudis PENNER, MD;  Location: [facility];  Service: Endoscopy;;  gastric   BREAST BIOPSY     BREAST REDUCTION SURGERY  1994   CATARACT EXTRACTION W/PHACO Left 06/09/2023   Procedure: CATARACT EXTRACTION PHACO AND INTRAOCULAR LENS PLACEMENT (IOC);  Surgeon: Harrie Agent, MD;  Location: [facility];  Service: Ophthalmology;  Laterality: Left;  CDE 7.03   CATARACT EXTRACTION W/PHACO Right 06/23/2023   Procedure: CATARACT EXTRACTION PHACO AND INTRAOCULAR LENS PLACEMENT (IOC);  Surgeon: Harrie Agent, MD;  Location: [facility];  Service: Ophthalmology;  Laterality: Right;  CDE: 11.55   CHOLECYSTECTOMY  1975   COLONOSCOPY N/A 08/19/2013   Procedure: COLONOSCOPY;  Surgeon: Claudis PENNER Golda, MD;  Location: [facility];  Service: Endoscopy;  Laterality: N/A;  155-moved to 140 Ann to notify pt   COLONOSCOPY N/A  08/26/2018   Procedure: COLONOSCOPY;  Surgeon: Golda Claudis PENNER, MD;  Location: [facility];  Service: Endoscopy;  Laterality: N/A;   ESOPHAGOGASTRODUODENOSCOPY N/A 08/26/2018   Procedure: ESOPHAGOGASTRODUODENOSCOPY (EGD);  Surgeon: Golda Claudis PENNER, MD;  Location: [facility];  Service: Endoscopy;  Laterality: N/A;   fibromyalgia     LUMBAR LAMINECTOMY/DECOMPRESSION MICRODISCECTOMY Bilateral 09/20/2015   Procedure: MICRO LUMBAR DECOMPRESSION L5-S1 BILATERAL    (1 LEVEL);  Surgeon: Reyes Billing, MD;  Location: [facility];  Service: Orthopedics;  Laterality: Bilateral;   rheumatoid arthritis     Sinus Surgery     TONSILLECTOMY  1968    Assessment / Plan / Recommendation Clinical Impression HPI: Betty Golden is a 78 year old female with past medical history of asthma, headaches, chronic neck pain, CAD, depression, hypertension, fibromyalgia, GERD, IBS, hyperlipidemia, rheumatoid arthritis, COPD who was admitted to [facility] 04/12/24 after she was found to have generalized weakness, dizziness several falls and confusion.  Treated for acute metabolic encephalopathy, mentation appears to be gradually improved.  Patient was noted to not be taking her meds for about 2 days.  Patient was diagnosed with sigmoid diverticulitis and was started on antibiotics.  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  She has not been able to tolerate MRI of the brain.  Neurology recommending aspirin  and Plavix  75 mg for secondary stroke prevention.  Seen by PT/OT and they recommend CIR to assist return to PLOF.   Clinical Impression:  Bedside Swallow Evaluation: A bedside swallow evaluation was completed to assess for s/sx of oropharyngeal dysphagia. Oral mechanism exam WFL. POs administered included thin liquids and solids. Patient with timely mastication and complete oral clearance. Patient with  immediate throat clearing after consumption of solids, though no s/sx of aspiration after single sips of liquids. This is likely consistent with patients prior hx of GERD and patient reports consistent throat clears at baseline. Recommend regular/thin diet with use of standardized precautions including sitting upright during/after PO and taking small bites/sips at a slow rate. Recommend intermittent supervision during mealtimes. SLP to follow for diet tolerance. 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 Patients expressive language is characterized by apraxic errors as well as occasional phonemic/semantic paraphasias and perseverations. Patient counted 1-10 given initial word in sequence, though with increased difficulty during days of the week and months of the year. Patient benefited most from written cues. During confrontational naming, patient with 80% accuracy and 100% accuracy during reponsive naming. Patient with inconsistent ability to repeat the single word level secondary to apraxic errors. Patient with mild receptive deficits characterized by 100% accuracy during simple yes/no questions and 70% accuracy during complex. Patient able to follow single step commands, however required occasional repetition during 2-3 step commands.  Cognition: Informally assessed cognition, in which patient presents with deficits in the realms of sustained attention and executive functioning. Patient would benefit from further evaluation re: complex porblem solving and memory as language improves. Patient is oriented to self, age, location, situation and time when given opportunity to write answers.  Pt would benefit from skilled ST services to maximize dysphagia, cognition and communication in order to maximize functional independence at d/c. Anticipate patient will require supervision at d/c and f/u SLP services.    Skilled Therapeutic Interventions          Patient evaluated using  a nonstandardized cognitive linguistic assessment and bedside swallow evaluation to assess current cognitive, communicative and swallowing function. See above for details.    SLP Assessment  Patient will need skilled Speech Lanaguage Pathology Services during CIR admission    Recommendations  SLP Diet Recommendations: Age appropriate regular solids;Thin Liquid Administration via: Cup;Straw Medication Administration: Whole meds with liquid Supervision: Patient able to self feed;Intermittent supervision to cue for compensatory strategies Compensations: Minimize environmental distractions;Slow rate;Small sips/bites Postural Changes and/or Swallow Maneuvers: Upright 30-60 min after meal;Seated upright 90 degrees Oral Care Recommendations: Oral care BID Patient destination: Home Follow up Recommendations: Outpatient SLP Equipment Recommended: None recommended by SLP    SLP Frequency 3 to 5 out of 7 days   SLP Duration  SLP Intensity  SLP Treatment/Interventions 5-7 days  Minumum of 1-2 x/day, 30 to 90 minutes  Cognitive remediation/compensation;Cueing hierarchy;Dysphagia/aspiration precaution training;Functional tasks;Internal/external aids;Multimodal communication approach;Patient/family education;Speech/Language facilitation;Therapeutic Activities    Pain None reported   SLP Evaluation Cognition Overall Cognitive Status: Impaired/Different from baseline Arousal/Alertness: Awake/alert Orientation Level: Oriented X4 Year: 2025 Month: September Day of Week: Correct Attention: Sustained Sustained Attention: Impaired Sustained Attention Impairment: Verbal basic;Functional basic Memory: Impaired (cont to assess as language improves) Memory Impairment: Decreased recall of new information;Retrieval deficit Problem Solving: Impaired Problem Solving Impairment: Verbal basic Executive Function: Sequencing Sequencing: Impaired Sequencing Impairment: Verbal basic Comments: pt follows  simple one step commands, increased difficulty with multistep commands  Comprehension Auditory Comprehension Overall Auditory Comprehension: Impaired Yes/No Questions: Impaired Basic Biographical Questions: 76-100% accurate Basic  Immediate Environment Questions: 75-100% accurate Complex Questions: 50-74% accurate Commands: Impaired One Step Basic Commands: 75-100% accurate Two Step Basic Commands: 75-100% accurate Multistep Basic Commands: 50-74% accurate Conversation: Simple Interfering Components: Motor planning Expression Expression Primary Mode of Expression: Verbal Verbal Expression Overall Verbal Expression: Impaired Initiation: Impaired Automatic Speech: Name;Counting;Social Response;Day of week;Month of year Level of Generative/Spontaneous Verbalization: Phrase Repetition: Impaired Level of Impairment: Word level Naming: Impairment Responsive: 76-100% accurate Confrontation: Impaired Verbal Errors: Phonemic paraphasias;Semantic paraphasias;Neologisms;Aware of errors;Not aware of errors Effective Techniques: Semantic cues;Phonemic cues;Written cues Non-Verbal Means of Communication: Writing Oral Motor Oral Motor/Sensory Function Overall Oral Motor/Sensory Function: Within functional limits Motor Speech Overall Motor Speech: Impaired Respiration: Within functional limits Phonation: Normal Resonance: Within functional limits Articulation: Within functional limitis Intelligibility: Intelligible Motor Planning: Impaired Level of Impairment: Word Motor Speech Errors: Groping for words;Aware Effective Techniques: Slow rate;Pause  Care Tool Care Tool Cognition Ability to hear (with hearing aid or hearing appliances if normally used Ability to hear (with hearing aid or hearing appliances if normally used): 0. Adequate - no difficulty in normal conservation, social interaction, listening to TV   Expression of Ideas and Wants Expression of Ideas and Wants: 2. Frequent  difficulty - frequently exhibits difficulty with expressing needs and ideas   Understanding Verbal and Non-Verbal Content Understanding Verbal and Non-Verbal Content: 3. Usually understands - understands most conversations, but misses some part/intent of message. Requires cues at times to understand  Memory/Recall Ability Memory/Recall Ability : Location of own room;That he or she is in a hospital/hospital unit;Staff names and faces    Bedside Swallowing Assessment General Previous Swallow Assessment: none Diet Prior to this Study: Regular;Thin liquids (Level 0) Respiratory Status: Room air Behavior/Cognition: Alert;Cooperative Oral Cavity - Dentition: Dentures, top (bottom dentures unavaliable) Self-Feeding Abilities: Needs set up Patient Positioning: Upright in bed Volitional Cough: Strong Volitional Swallow: Able to elicit  Ice Chips Ice chips: Not tested Thin Liquid Thin Liquid: Within functional limits Presentation: Straw;Self Fed Nectar Thick Nectar Thick Liquid: Not tested Honey Thick Honey Thick Liquid: Not tested Puree Puree: Not tested Solid Solid: Impaired Presentation: Self Fed Pharyngeal Phase Impairments: Throat Clearing - Immediate BSE Assessment Suspected Esophageal Findings Suspected Esophageal Findings: Other (comment) (throat clearing after solids) Risk for Aspiration Impact on safety and function: Mild aspiration risk Other Related Risk Factors: History of GERD  Short Term Goals: Week 1: SLP Short Term Goal 1 (Week 1): STG = LTG due to ELOS  Refer to Care Plan for Long Term Goals  Recommendations for other services: None   Discharge Criteria: Patient will be discharged from SLP if patient refuses treatment 3 consecutive times without medical reason, if treatment goals not met, if there is a change in medical status, if patient makes no progress towards goals or if patient is discharged from hospital.  The above assessment, treatment plan, treatment  alternatives and goals were discussed and mutually agreed upon: by patient  Kairee Kozma M.A., CCC-SLP 04/19/2024, 11:13 AM

## 2024-04-19 NOTE — Progress Notes (Signed)
 Inpatient Rehabilitation Care Coordinator Assessment and Plan Patient Details  Name: Betty Golden MRN: [id number] Date of Birth: May 28, 1946  Today's Date: 04/19/2024  Hospital Problems: Principal Problem:   Acute ischemic left middle cerebral artery (MCA) stroke [facility])  Past Medical History:  Past Medical History:  Diagnosis Date   Asthma    C. difficile colitis    Cervical stenosis of spine    Chronic headaches    Chronic neck pain    Coronary artery disease    Depression    Diverticulosis    Essential hypertension    Fibromyalgia    GERD (gastroesophageal reflux disease)    History of Salmonella gastroenteritis    HNP (herniated nucleus pulposus), lumbar    Hyperlipidemia    IBS (irritable bowel syndrome)    Lung nodule    rheumatoid arthritis    Past Surgical History:  Past Surgical History:  Procedure Laterality Date   APPENDECTOMY  1965   BIOPSY  08/26/2018   Procedure: BIOPSY;  Surgeon: Golda Claudis PENNER, MD;  Location: [facility];  Service: Endoscopy;;  gastric   BREAST BIOPSY     BREAST REDUCTION SURGERY  1994   CATARACT EXTRACTION W/PHACO Left 06/09/2023   Procedure: CATARACT EXTRACTION PHACO AND INTRAOCULAR LENS PLACEMENT (IOC);  Surgeon: Harrie Agent, MD;  Location: [facility];  Service: Ophthalmology;  Laterality: Left;  CDE 7.03   CATARACT EXTRACTION W/PHACO Right 06/23/2023   Procedure: CATARACT EXTRACTION PHACO AND INTRAOCULAR LENS PLACEMENT (IOC);  Surgeon: Harrie Agent, MD;  Location: [facility];  Service: Ophthalmology;  Laterality: Right;  CDE: 11.55   CHOLECYSTECTOMY  1975   COLONOSCOPY N/A 08/19/2013   Procedure: COLONOSCOPY;  Surgeon: Claudis PENNER Golda, MD;  [facility];  Service: Endoscopy;  Laterality: N/A;  155-moved to 140 Ann to notify pt   COLONOSCOPY N/A 08/26/2018   Procedure: COLONOSCOPY;  Surgeon: Golda Claudis PENNER, MD;  [facility];  Service: Endoscopy;  Laterality: N/A;   ESOPHAGOGASTRODUODENOSCOPY N/A 08/26/2018    Procedure: ESOPHAGOGASTRODUODENOSCOPY (EGD);  Surgeon: Golda Claudis PENNER, MD;  Location[facility];  Service: Endoscopy;  Laterality: N/A;   fibromyalgia     LUMBAR LAMINECTOMY/DECOMPRESSION MICRODISCECTOMY Bilateral 09/20/2015   Procedure: MICRO LUMBAR DECOMPRESSION L5-S1 BILATERAL    (1 LEVEL);  Surgeon: Reyes Billing, MD;  Location: [facility];  Service: Orthopedics;  Laterality: Bilateral;   rheumatoid arthritis     Sinus Surgery     TONSILLECTOMY  1968   Social History:  reports that she quit smoking about 35 years ago. Her smoking use included cigarettes. She started smoking about 45 years ago. She has a 20 pack-year smoking history. She has never used smokeless tobacco. She reports that she does not drink alcohol and does not use drugs.  Family / Support Systems Marital Status: Single Patient Roles: Parent, Other (Comment) (retiree) Children: Nicloe [phone] Other Supports: Tyler-grandson [phone]  Tyrone-grandson [phone] Anticipated Caregiver: [name] Ability/Limitations of Caregiver: Daughter has some health issues of her own and does not work. Between she and her husband and grandson's somewhere can be there Caregiver Availability: 24/7 Family Dynamics: Close knit with family pt has made good progress and seems to be more clear. She acknowledges and hopes to do well, but still does have some speech issues.  Social History Preferred language: English Religion: Baptist Cultural Background: NA Education: HS Health Literacy - How often do you need to have someone help you when you read instructions, pamphlets, or other written material from your doctor  or pharmacy?: Sometimes Writes: Yes Employment Status: Retired Marine scientist Issues: NA Guardian/Conservator: None-according to MD pt is not fully capable of making her own decisions will look toward her daughter to make any decisions while here   Abuse/Neglect Abuse/Neglect Assessment Can Be Completed: Yes Physical  Abuse: Denies Verbal Abuse: Denies Sexual Abuse: Denies Exploitation of patient/patient's resources: Denies Self-Neglect: Denies  Patient response to: Social Isolation - How often do you feel lonely or isolated from those around you?: Rarely  Emotional Status Pt's affect, behavior and adjustment status: Pt is motivated to do well and recover and get back home. She prior to admission could do for herself and was mobile. Someone is always there and can provide supervision if needed. Recent Psychosocial Issues: other health issues Psychiatric History: No issues/history seems to be clearing cognitively and working on her progress. Will continue to assess and see if would benefit from seeing neuro-psych while here Substance Abuse History: NA  Patient / Family Perceptions, Expectations & Goals Pt/Family understanding of illness & functional limitations: Pt and daughter can explain her stroke and issues regarding this. Both do talk with the MD involved and feel have a good understanding of her health plan moving forward. Premorbid pt/family roles/activities: Mom, grandmother, retiree, church member, etc Anticipated changes in roles/activities/participation: resume Pt/family expectations/goals: Pt states:  I hope to keep doing well I know I have speech issues.   Daughter states:  We will help her but know she wants to do things on her own if she can.  Community [employer] Agencies: None Premorbid Home Care/DME Agencies: Other (Comment) (wc, 3 in 1, rw, cane) Transportation available at discharge: daughter Is the patient able to respond to transportation needs?: Yes In the past 12 months, has lack of transportation kept you from medical appointments or from getting medications?: No In the past 12 months, has lack of transportation kept you from meetings, work, or from getting things needed for daily living?: No  Discharge Planning Living Arrangements: Children, Other  relatives Support Systems: Children, Other relatives, Friends/neighbors, Church/faith community Type of Residence: Private residence Insurance Resources: Media planner (specify) Theatre manager Medicare) Surveyor, quantity Resources: Tree surgeon, Family Support Financial Screen Referred: No Living Expenses: Lives with family Money Management: Family Does the patient have any problems obtaining your medications?: No Home Management: family Patient/Family Preliminary Plans: Return home with daughter and her family someone will be always be there with pt. Aware being evaluated and goals being set for stay here. Pt is moving quickly and will be a short LOS. Care Coordinator Barriers to Discharge: Insurance for SNF coverage Care Coordinator Anticipated Follow Up Needs: HH/OP  Clinical Impression Pleasant female who is motivated to do well and recover from this stroke. She is doing well and daughter and her family are involved and supportive and will assist if needed. Team conference on Wed will have a target discharge date then.  Raymonde Asberry MATSU 04/19/2024, 10:40 AM

## 2024-04-19 NOTE — Progress Notes (Signed)
 Physical Therapy Session Note  Patient Details  Name: Betty Golden MRN: [id number] Date of Birth: [birth year]  Today's Date: 04/19/2024 PT Individual Time: 1115-1158 PT Individual Time Calculation (min): 43 min   Short Term Goals: Week 1:  PT Short Term Goal 1 (Week 1): STGs = LTGs  Skilled Therapeutic Interventions/Progress Updates:    Functional gait in room with RW to go to bathroom and then perform hand hygiene at sink. Pt completes mobility with RW with overall CGA for balance, noted knee flexion L > R but no buckling. Pt performs clothing management and hygiene independently with CGA for balance without UE support. Gait training on unit x 150' with RW with overall CGA with improved L knee extension with verbal cues but difficulty sustaining it without intermittent cues. Noted that ROM appears WFL. Pt does report h/o of OA but due to aphasia difficulty with confirming if pt walked with knees flexed prior as well or new onset. Will continue to assess. NMR for dynamic gait and balance with obstacle course negotiation and stepping over simulated threshold with RW with overall CGA. Some mild L inattention noted at times functionally. Administered Berg Balance assessment for fall risk (see results below) and discussed results with pt and recommendation for use of RW at this time - she was in agreement. Returned to room at end of session and set up in recliner with all needs in reach.   Therapy Documentation Precautions:  Precautions Precautions: Fall Recall of Precautions/Restrictions: Impaired Precaution/Restrictions Comments: fall risk and word salad on occassion Restrictions Weight Bearing Restrictions Per Provider Order: No  Pain: Pain Assessment Pain Scale: 0-10 Pain Score: 0-No pain    Balance: Standardized Balance Assessment Standardized Balance Assessment: Berg Balance Test Berg Balance Test Sit to Stand: Needs minimal aid to stand or to stabilize Standing Unsupported:  Able to stand 30 seconds unsupported Sitting with Back Unsupported but Feet Supported on Floor or Stool: Able to sit safely and securely 2 minutes Stand to Sit: Uses backs of legs against chair to control descent Transfers: Needs one person to assist Standing Unsupported with Eyes Closed: Able to stand 10 seconds with supervision Standing Ubsupported with Feet Together: Needs help to attain position but able to stand for 30 seconds with feet together From Standing, Reach Forward with Outstretched Arm: Reaches forward but needs supervision From Standing Position, Pick up Object from Floor: Unable to try/needs assist to keep balance (fearful of falling) From Standing Position, Turn to Look Behind Over each Shoulder: Turn sideways only but maintains balance Turn 360 Degrees: Needs assistance while turning Standing Unsupported, Alternately Place Feet on Step/Stool: Able to complete >2 steps/needs minimal assist Standing Unsupported, One Foot in Front: Loses balance while stepping or standing Standing on One Leg: Unable to try or needs assist to prevent fall Total Score: 18     Therapy/Group: Individual Therapy  [name] [name] [name] Elnor, PT, DPT, CBIS  04/19/2024, 12:00 PM

## 2024-04-19 NOTE — IPOC Note (Signed)
 Overall Plan of Care [facility]) Patient Details Name: Betty Golden MRN: [id number] DOB: Jul 01, 1946  Admitting Diagnosis: Acute ischemic left middle cerebral artery (MCA) stroke [facility])  Hospital Problems: Principal Problem:   Acute ischemic left middle cerebral artery (MCA) stroke (HCC)     Functional Problem List: Nursing Bowel  PT Balance, Endurance, Perception, Sensory, Safety, Motor  OT Cognition, Endurance, Balance, Perception, Safety  SLP Cognition, Linguistic, Nutrition  TR         Basic ADL's: OT Other (comment) (Task performance over time related to safety)     Advanced  ADL's: OT Simple Meal Preparation     Transfers: PT Bed Mobility, Bed to Chair, Car  OT Other (comment) (work over time and level of fatigue)     Locomotion: PT Ambulation, Stairs     Additional Impairments: OT    SLP Swallowing, Social Cognition, Communication comprehension, expression Problem Solving, Attention  TR      Anticipated Outcomes Item Anticipated Outcome  Self Feeding ModI  Swallowing  mod i   Basic self-care  ModI  Toileting  ModI   Bathroom Transfers ModI  Bowel/Bladder  manage bowel w mod I assist  Transfers  S transfers  Locomotion  S gait , contact gaurd stairs  Communication  min  Cognition  min  Pain  Pain < 4 with prns  Safety/Judgment  manage safety w cues   Therapy Plan: PT Intensity: Minimum of 1-2 x/day ,45 to 90 minutes PT Frequency: 5 out of 7 days PT Duration Estimated Length of Stay: 5 to 7 days OT Intensity: Minimum of 1-2 x/day, 45 to 90 minutes OT Frequency: 5 out of 7 days OT Duration/Estimated Length of Stay: 5-7 days SLP Intensity: Minumum of 1-2 x/day, 30 to 90 minutes SLP Frequency: 3 to 5 out of 7 days SLP Duration/Estimated Length of Stay: 5-7 days   Team Interventions: Nursing Interventions Patient/Family Education, Medication Management, Bowel Management, Disease Management/Prevention, Pain Management, Discharge Planning   PT interventions Ambulation/gait training, Neuromuscular re-education, Stair training, UE/LE Strength taining/ROM, Metallurgist training, Discharge planning, Therapeutic Activities, UE/LE Coordination activities, Functional mobility training, Patient/family education, Therapeutic Exercise  OT Interventions Balance/vestibular training, Self Care/advanced ADL retraining, Therapeutic Activities, UE/LE Coordination activities, Patient/family education, Cognitive remediation/compensation, Therapeutic Exercise, Visual/perceptual remediation/compensation, DME/adaptive equipment instruction, UE/LE Strength taining/ROM  SLP Interventions Cognitive remediation/compensation, Cueing hierarchy, Dysphagia/aspiration precaution training, Functional tasks, Internal/external aids, Multimodal communication approach, Patient/family education, Speech/Language facilitation, Therapeutic Activities  TR Interventions    SW/CM Interventions Discharge Planning, Psychosocial Support, Patient/Family Education   Barriers to Discharge MD  Expressive language skills and hx RA  Nursing Decreased caregiver support, Home environment access/layout 2 level main B+B with ramped entry  PT Inaccessible home environment garage entrance 1 step then 1 1/2 sil into house  OT      SLP      SW Insurance for SNF coverage     Team Discharge Planning: Destination: PT-Home ,OT- Home , SLP-Home Projected Follow-up: PT-Home health PT, OT-  None, SLP-Outpatient SLP Projected Equipment Needs: PT-To be determined, OT- To be determined, SLP-None recommended by SLP Equipment Details: PT- , OT-  Patient/family involved in discharge planning: PT- Patient,  OT-Patient, SLP-Patient  MD ELOS: 7-10d Medical Rehab Prognosis:  Good Assessment: The patient has been admitted for CIR therapies with the diagnosis of MCA stroke. The team will be addressing functional mobility, strength, stamina, balance, safety, adaptive techniques and equipment,  self-care, bowel and bladder mgt, patient and caregiver education, manage BP, assess/tx impact of RA.  Goals have been set at Mod I/Sup for mobility but min A Language. Anticipated discharge destination is Home.        See Team Conference Notes for weekly updates to the plan of care

## 2024-04-19 NOTE — Progress Notes (Addendum)
 Inpatient Rehabilitation Center Individual Statement of Services  Patient Name:  AMIKA TASSIN  Date:  04/19/2024  Welcome to the Inpatient Rehabilitation Center.  Our goal is to provide you with an individualized program based on your diagnosis and situation, designed to meet your specific needs.  With this comprehensive rehabilitation program, you will be expected to participate in at least 3 hours of rehabilitation therapies Monday-Friday, with modified therapy programming on the weekends.  Your rehabilitation program will include the following services:  Physical Therapy (PT), Occupational Therapy (OT), Speech Therapy (ST), 24 hour per day rehabilitation nursing, Therapeutic Recreaction (TR), Care Coordinator, Rehabilitation Medicine, Nutrition Services, and Pharmacy Services  Weekly team conferences will be held on Wednesday to discuss your progress.  Your Inpatient Rehabilitation Care Coordinator will talk with you frequently to get your input and to update you on team discussions.  Team conferences with you and your family in attendance may also be held.  Expected length of stay: 5-7 days  Overall anticipated outcome: supervision/mod/I level  Depending on your progress and recovery, your program may change. Your Inpatient Rehabilitation Care Coordinator will coordinate services and will keep you informed of any changes. Your Inpatient Rehabilitation Care Coordinator's name and contact numbers are listed  below.  The following services may also be recommended but are not provided by the Inpatient Rehabilitation Center:   Home Health Rehabiltiation Services Outpatient Rehabilitation Services    Arrangements will be made to provide these services after discharge if needed.  Arrangements include referral to agencies that provide these services.  Your insurance has been verified to be:  [employer] Your primary doctor is:  Ozell Polite  Pertinent information will be shared with  your doctor and your insurance company.  Inpatient Rehabilitation Care Coordinator:  Rhoda Clement, KEN [phone] or ELIGAH BASQUES  Information discussed with and copy given to patient by: Clement Asberry MATSU, 04/19/2024, 10:42 AM

## 2024-04-20 DIAGNOSIS — I69320 Aphasia following cerebral infarction: Secondary | ICD-10-CM | POA: Diagnosis not present

## 2024-04-20 DIAGNOSIS — G8191 Hemiplegia, unspecified affecting right dominant side: Secondary | ICD-10-CM | POA: Diagnosis not present

## 2024-04-20 DIAGNOSIS — I63512 Cerebral infarction due to unspecified occlusion or stenosis of left middle cerebral artery: Secondary | ICD-10-CM | POA: Diagnosis not present

## 2024-04-20 LAB — HEMOGLOBIN AND HEMATOCRIT, BLOOD
HCT: 32.8 % — ABNORMAL LOW (ref 36.0–46.0)
Hemoglobin: 10.9 g/dL — ABNORMAL LOW (ref 12.0–15.0)

## 2024-04-20 NOTE — Progress Notes (Signed)
 PROGRESS NOTE   Subjective/Complaints:   ROS: as per HPI. Denies CP, SOB, abd pain, N/V/D/C, or any other complaints at this time.    Objective:   No results found. Recent Labs    04/18/24 0655 04/19/24 0504 04/20/24 0457  WBC 13.6* 10.9*  --   HGB 12.8 11.0* 10.9*  HCT 39.1 33.0* 32.8*  PLT 216 202  --    Recent Labs    04/18/24 0655 04/19/24 0504  NA 140 140  K 3.8 3.5  CL 110 110  CO2 19* 20*  GLUCOSE 93 83  BUN 9 9  CREATININE 1.13* 1.10*  CALCIUM  9.7 9.3        Intake/Output Summary (Last 24 hours) at 04/20/2024 0757 Last data filed at 04/20/2024 0700 Gross per 24 hour  Intake 826 ml  Output --  Net 826 ml        Physical Exam: Vital Signs Blood pressure (!) 148/85, pulse 99, temperature 98.4 F (36.9 C), resp. rate 18, height 5' 3 (1.6 m), weight 69.1 kg, SpO2 98%.   General: No acute distress Mood and affect are appropriate Heart: Regular rate and rhythm no rubs murmurs or extra sounds Lungs: Clear to auscultation, breathing unlabored, no rales or wheezes Abdomen: Positive bowel sounds, soft nontender to palpation, nondistended Extremities: No clubbing, cyanosis, or edema Skin: No evidence of breakdown, no evidence of rash Neurologic: Cranial nerves II through XII intact,Sensory exam difficult to assess due to aphasia  Cerebellar exam normal finger to nose to finger  Musculoskeletal: Full range of motion in all 4 extremities. No joint swelling    Neuro: Mental Status: AAOx to day, for month before October, for year able to get with choices, unable to say her name   Speech/Languate: Aphasia more expressive than receptive, cannot state day and date due to naming issues       MOTOR: RUE: 4/5 Deltoid, 4/5 Biceps, 4/5 Triceps,4/5 Grip LUE: 4/5 Deltoid, 4/5 Biceps, 4/5 Triceps, 4/5 Grip RLE: HF 4/5, KE 4/5, ADF 4/5, APF 5/5 LLE: HF 4-/5, KE 4-/5, ADF 4/5, APF 5/5     REFLEXES: No  ankle clonus   SENSORY: Normal to touch all 4 extremities   Coordination: Normal finger to nose bilaterally  Assessment/Plan: 1. Functional deficits which require 3+ hours per day of interdisciplinary therapy in a comprehensive inpatient rehab setting. Physiatrist is providing close team supervision and 24 hour management of active medical problems listed below. Physiatrist and rehab team continue to assess barriers to discharge/monitor patient progress toward functional and medical goals  Care Tool:  Bathing    Body parts bathed by patient: Right arm, Left arm, Chest, Abdomen, Front perineal area, Buttocks, Right upper leg, Left upper leg, Right lower leg, Left lower leg, Face         Bathing assist Assist Level: Contact Guard/Touching assist     Upper Body Dressing/Undressing Upper body dressing   What is the patient wearing?: Bra, Pull over shirt    Upper body assist Assist Level: Minimal Assistance - Patient > 75%    Lower Body Dressing/Undressing Lower body dressing      What is the patient wearing?: Underwear/pull up, Pants  Lower body assist Assist for lower body dressing: Minimal Assistance - Patient > 75%     Toileting Toileting    Toileting assist Assist for toileting: Supervision/Verbal cueing     Transfers Chair/bed transfer  Transfers assist     Chair/bed transfer assist level: Contact Guard/Touching assist     Locomotion Ambulation   Ambulation assist      Assist level: Contact Guard/Touching assist Assistive device: Walker-rolling Max distance: 150   Walk 10 feet activity   Assist     Assist level: Contact Guard/Touching assist Assistive device: Walker-rolling   Walk 50 feet activity   Assist    Assist level: Contact Guard/Touching assist Assistive device: Walker-rolling    Walk 150 feet activity   Assist    Assist level: Contact Guard/Touching assist Assistive device: Walker-rolling    Walk 10 feet on  uneven surface  activity   Assist Walk 10 feet on uneven surfaces activity did not occur: Safety/medical concerns   Assist level: Contact Guard/Touching assist Assistive device: Walker-rolling   Wheelchair     Assist Is the patient using a wheelchair?: Yes (per PT Evaluation documentation) Type of Wheelchair: Manual    Wheelchair assist level: Supervision/Verbal cueing Max wheelchair distance: 150    Wheelchair 50 feet with 2 turns activity    Assist        Assist Level: Supervision/Verbal cueing   Wheelchair 150 feet activity     Assist      Assist Level: Supervision/Verbal cueing   Blood pressure (!) 148/85, pulse 99, temperature 98.4 F (36.9 C), resp. rate 18, height 5' 3 (1.6 m), weight 69.1 kg, SpO2 98%.  Medical Problem List and Plan: 1. 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  Patient did not tolerate MRI             -patient may shower             -ELOS/Goals: 10-12 days, PT/OT/SLP supervision             -Admit to CIR 2.  Antithrombotics: -DVT/anticoagulation:  Pharmaceutical: Lovenox  40mg  daily -antiplatelet therapy: DAPT X 3 months followed by ASA alone.  Discharge summary and decubitus.  Discharge summary indicated aspirin  and Plavix  for 21 days in 1 location, message hospitalist to confirm 3 months 3. Pain Management:  Tylenol  prn.  4. Mood/Behavior/Sleep: LCSW to follow for evaluation and support --trazodone  25 mg at bedtime. Ativan  prn for anxiety/agitation due to aphasia?   -04/18/24 still having trouble sleeping-- scheduled melatonin 5mg              -antipsychotic agents: has had haldol  prn--last used on 09/18. Not reordered -DC summary indicates cymbalta  60mg  daily, does not appear she got this at the acute hospital. Pt states she uses 30mg  BID-- reordered 5. Neuropsych/cognition: This patient may be capable of making decisions on her own behalf. 6. Skin/Wound Care: Routine  pressure relief measures.             -- Monitor skin integrity 7. Fluids/Electrolytes/Nutrition: Monitor I/O. Routine labs. Vitamins/supplements.     Latest Ref Rng & Units 04/19/2024    5:04 AM 04/18/2024    6:55 AM 04/17/2024    5:26 AM  BMP  Glucose 70 - 99 mg/dL 83  93  891   BUN 8 - 23 mg/dL 9  9  10    Creatinine 0.44 - 1.00 mg/dL 8.89  8.86  8.92   Sodium 135 - 145 mmol/L  140  140  141   Potassium 3.5 - 5.1 mmol/L 3.5  3.8  3.6   Chloride 98 - 111 mmol/L 110  110  111   CO2 22 - 32 mmol/L 20  19  19    Calcium  8.9 - 10.3 mg/dL 9.3  9.7  9.4     8. Diverticulitis/Colitis: Transitioned to cipro  500mg  bid and Flagyl  500 tid orally on 9/18, IV abx from 9/15-9/18 -WBC trending down today but also had drop in H/H. Monitor for signs of bleeding.               - Follow-up with gastroenterologist outpatient Cipro  and Metronidazole  until 9/27  9. HTN: Monitor BP TID--avoid hypoperfusion. BP meds have been resumed.  Monitor with activity. --On amlodipine  10mg  daily -04/18/24 BP variable, leave meds as is for now, monitor  Vitals:   04/17/24 1712 04/17/24 1954 04/18/24 0336 04/18/24 1228  BP: 99/85 (!) 145/98 (!) 161/92 (!) 143/66   04/18/24 1419 04/18/24 2011 04/19/24 0535 04/19/24 1303  BP: (!) 160/78 (!) 158/70 136/74 (!) 152/75   04/19/24 2026 04/20/24 0310  BP: (!) 156/81 (!) 148/85    10. COPD: On Breztri  bid with Fasenra  every 8 weeks. 11. RA: Stable on Leflunomide  20mg  daily.  12. Hypokalemia: Being supplemented daily but still low -04/18/24 K 3.8, monitor routine labs 13. Hypomagnesemia: 1.8 on recheck 9/21, monitor as needed 14.  ABLA: Monitor for rectal bleeding. Hgb down from 12.8 to 11.2. Back up to 13.0 on 9/20-- 12.8 9/21, monitor routinely     Latest Ref Rng & Units 04/20/2024    4:57 AM 04/19/2024    5:04 AM 04/18/2024    6:55 AM  CBC  WBC 4.0 - 10.5 K/uL  10.9  13.6   Hemoglobin 12.0 - 15.0 g/dL 89.0  88.9  87.1   Hematocrit 36.0 - 46.0 % 32.8  33.0  39.1    Platelets 150 - 400 K/uL  202  216    Check stool OB, repeat H and H stable  15, Delirium: Resolving but needed ativan   16. Leukocytosis: improving only borderline elevation  17. HLD: continue rosuvastatin  20mg  daily 18. GERD: protonix  40mg  daily.     LOS: 3 days A FACE TO FACE EVALUATION WAS PERFORMED  Betty Golden 04/20/2024, 7:57 AM

## 2024-04-20 NOTE — Progress Notes (Signed)
 Occupational Therapy Session Note  Patient Details  Name: Betty Golden MRN: [id number] Date of Birth: 1945-12-20  Today's Date: 04/20/2024 OT Individual Time: 9084-8984 OT Individual Time Calculation (min): 60 min    Short Term Goals: Week 1:  OT Short Term Goal 1 (Week 1): The pt will safely  complete UB/LB  bathing / dressing with ModI incorporating AE as needed. OT Short Term Goal 2 (Week 1): The pt will safely transfer to all surfaces with ModI incorporating AE as needed. OT Short Term Goal 3 (Week 1): The pt will demonstrate good safety awareness while completing a simple home making task at Mod I using AE as needed. OT Short Term Goal 4 (Week 1): The pt will complete a cognitive activity with accuracy 3 out of  5 attempt with minimal cues following  demonstration and verbal instruction.  Skilled Therapeutic Interventions/Progress Updates:    Pt received in bed sleeping but awoke easily.  She requested to shower later in the day. Will talk with her female PT and see if she would be able to shower during her session.  Pt sat to EOB and opted to put on her bra on.  Doffed shirt and donned bra independently today! Yesterday she needed A with clasps.  Sitting EOB using figure 4 pt donned sock and shoes and even tied them.  She did need EXTRA time to tie shoes as it took her a few trials.  She said at home when she has FM flare ups, she will ask her dtr for A.  She also has slip in shoes.    Pt talked about her fibromyalgia and how some days she really can not do much. She does have expressive aphasia but able to express her basic needs.   Pt picked out the clothing she will wear after a shower later today.  It took her some time to sort through the clothes and open packages of new underwear.    Pt needed to toilet, ambulated to bathroom with RW with CGA.  Close s/CGA with toileting.   Then ambulated to sink to wash hands.   Pt stated she was feeling tired and requested to go to bed.   Pt got into bed with S.  Bed alarm set.      Therapy Documentation Precautions:  Precautions Precautions: Fall Recall of Precautions/Restrictions: Impaired Precaution/Restrictions Comments: fall risk and word salad on occassion Restrictions Weight Bearing Restrictions Per Provider Order: No   Pain: Pain Assessment Pain Scale: 0-10 Pain Score: 0-No pain ADL: ADL Equipment Provided: Other (comment) Eating: Set up Where Assessed-Eating: Chair Grooming: Supervision/safety Where Assessed-Grooming: Standing at sink Upper Body Bathing: Setup Where Assessed-Upper Body Bathing: Shower Lower Body Bathing: Contact guard Where Assessed-Lower Body Bathing: Shower Upper Body Dressing: Minimal assistance (to fasten bra) Where Assessed-Upper Body Dressing: Chair Lower Body Dressing: Minimal assistance Where Assessed-Lower Body Dressing: Chair Toileting: Contact guard Where Assessed-Toileting: Teacher, adult education: Furniture conservator/restorer Method: Proofreader: Other (comment), Grab bars Tub/Shower Transfer: Scientific laboratory technician Method: Ship broker: Information systems manager with back, Acupuncturist: Administrator, arts Method: Designer, industrial/product: Shower seat with back   Therapy/Group: Individual Therapy  Dashon Mcintire 04/20/2024, 8:41 AM

## 2024-04-20 NOTE — Progress Notes (Signed)
 Physical Therapy Session Note  Patient Details  Name: Betty Golden MRN: [id number] Date of Birth: 07-10-1946  Today's Date: 04/20/2024 PT Individual Time: 1106-1202 PT Individual Time Calculation (min): 56 min   Short Term Goals: Week 1:  PT Short Term Goal 1 (Week 1): STGs = LTGs  Skilled Therapeutic Interventions/Progress Updates: Patient L sidelying in bed on entrance to room. Patient alert and agreeable to PT session.   Patient reported no pain during session.   Therapeutic Activity: Bed Mobility: Pt performed supine<sit on EOB with bed flat and no use of railing with VC to perform supine to R sidelying as pt attempted to use B LE's as momentum via hip extension from flexion (education to avoid this to decrease risk of straining posterior musculature). Pt performed with close supervision for safety. Transfers: Pt performed sit<>stand transfers throughout session with close supervision and VC for hand placement. Pt ambulated short distance from EOB<toilet in room with RW and L LE presenting in some knee flexion. Pt with CGA/light minA for safety. Pt performed personal care with closed door supervision for safety. Pt ambulated to sink in RW and washed hands with supervision. Pt transported from room<>main gym in [facility] dependently for energy conservation.  Pt ambulated in main gym following therex in RW with noted improvement in maintaining L knee in extension after cue to engage R glutes with pt also reporting feeling more secure and safe while ambulating. Pt required CGA for safety.  Therapeutic Exercise: Pt performed the following exercises with therapist providing the described cuing and facilitation for improvement. - L standing knee extension with yellow theraband and PTA pulling into knee flexion with VC required to keep foot on ground and to avoid flexing hip and knee. Pt performed until close to fatigue with rest break required. Pt performed 2nd round with same cues and sequence  and overall light CGA for safety (pt also with B UE supported in RW). - Pt supine bridges on mat x 10 B LE's equally placed with L hip extending slightly higher than R. PTA placed L LE further out than R to emphasis R glutes vs L for 2 rounds close to fatigue.   Patient sitting in recliner at end of session with brakes locked, belt alarm set, and all needs within reach.      Therapy Documentation Precautions:  Precautions Precautions: Fall Recall of Precautions/Restrictions: Impaired Precaution/Restrictions Comments: fall risk and word salad on occassion Restrictions Weight Bearing Restrictions Per Provider Order: No  Therapy/Group: Individual Therapy  Elner Seifert PTA 04/20/2024, 12:30 PM

## 2024-04-20 NOTE — Progress Notes (Signed)
 Physical Therapy Session Note  Patient Details  Name: Betty Golden MRN: [id number] Date of Birth: [birth year]  Today's Date: 04/20/2024 PT Individual Time: 8548-8454 PT Individual Time Calculation (min): 54 min  And  Today's Date: 04/20/2024 PT Missed Time: 21 minutes Missed Time Reason: Patient fatigue  Short Term Goals: Week 1:  PT Short Term Goal 1 (Week 1): STGs = LTGs  Skilled Therapeutic Interventions/Progress Updates:  Patient sidelying to R side and asleep in bed on entrance to room. Easily roused and takes brief time to fully wake. Then pt alert and agreeable to PT session.   Patient with no pain complaint at start of session.  She relates desire to shower as she was fatigued earlier and needed rest. Able to get to seated position on EOB with supervision/ Mod I. Bed length adjusted and pt able to ambulate in room, around bed to personal bag, finds new pajamas her daughter has provided to her. Guided in other items she might need. Gathered underwear and asked RN for new grip socks on her entrance to room. Throughout session, pt has difficulty with word finding and is aware   Pt ambulates into bathroom and able to manage clothing with supervision to toilet. Continent of urine. Performs self-care with supervision. Pt setup in shower for safety and performs ambulatory transfer to shower. Cued to sit on shower bench as pt attempts to squeeze into tight shower space. Also still has socks on and requires vc to doff while seated on shower bench. Provided with washcloths and is able to fully cleanse using [facility]. Stands to complete washing with close supervision and hand to safety rail. Therapist turns off shower and provided towel to pt. Is able to dry self with supervision with vc to turn on shower bench to face out from shower and provide more space to fully dry self. Provided with nightgown and underwear and dresses with supervision. New socks donned using figure 4 positioning and  supervision.  Ambulatory transfer to sink in room using RW with supervision. Combs hair, brushes teeth supervision. Then pt relates desire to sit in recliner and rest following shower. Relates feeling refreshed yet fatigued.   Pt missed 21 min of skilled therapy due to fatigue following shower. Will re-attempt as schedule and pt availability permits.   Patient seated upright in recliner at end of session with brakes locked, belt alarm set, and all needs within reach.   Therapy Documentation Precautions:  Precautions Precautions: Fall Recall of Precautions/Restrictions: Impaired Precaution/Restrictions Comments: fall risk and word salad on occassion Restrictions Weight Bearing Restrictions Per Provider Order: No  Pain: No pain related this session.    Therapy/Group: Individual Therapy  Mliss DELENA Milliner PT, DPT, CSRS 04/20/2024, 5:17 PM

## 2024-04-20 NOTE — Plan of Care (Signed)
  Problem: Consults Goal: RH STROKE PATIENT EDUCATION Description: See Patient Education module for education specifics  Outcome: Progressing   Problem: RH BOWEL ELIMINATION Goal: RH STG MANAGE BOWEL WITH ASSISTANCE Description: STG Manage Bowel with mod I Assistance. Outcome: Progressing   Problem: RH SAFETY Goal: RH STG ADHERE TO SAFETY PRECAUTIONS W/ASSISTANCE/DEVICE Description: STG Adhere to Safety Precautions With cues Assistance/Device. Outcome: Progressing   Problem: RH PAIN MANAGEMENT Goal: RH STG PAIN MANAGED AT OR BELOW PT'S PAIN GOAL Description: Pain < 4 with prns Outcome: Progressing   Problem: RH KNOWLEDGE DEFICIT Goal: RH STG INCREASE KNOWLEDGE OF HYPERTENSION Description: Patient will be able to manage HTN using educational resources for medications and dietary modification independently Outcome: Progressing Goal: RH STG INCREASE KNOWLEGDE OF HYPERLIPIDEMIA Description: Patient will be able to manage HLD using educational resources for medications and dietary modification independently Outcome: Progressing Goal: RH STG INCREASE KNOWLEDGE OF STROKE PROPHYLAXIS Description: Patient will be able to manage secondary using educational resources for medications and dietary modification independently Outcome: Progressing   Problem: Consults Goal: RH STROKE PATIENT EDUCATION Description: See Patient Education module for education specifics  Outcome: Progressing

## 2024-04-21 DIAGNOSIS — G8191 Hemiplegia, unspecified affecting right dominant side: Secondary | ICD-10-CM | POA: Diagnosis not present

## 2024-04-21 DIAGNOSIS — I69320 Aphasia following cerebral infarction: Secondary | ICD-10-CM | POA: Diagnosis not present

## 2024-04-21 DIAGNOSIS — I63512 Cerebral infarction due to unspecified occlusion or stenosis of left middle cerebral artery: Secondary | ICD-10-CM | POA: Diagnosis not present

## 2024-04-21 LAB — URINALYSIS, W/ REFLEX TO CULTURE (INFECTION SUSPECTED)
Bilirubin Urine: NEGATIVE
Glucose, UA: NEGATIVE mg/dL
Hgb urine dipstick: NEGATIVE
Ketones, ur: 5 mg/dL — AB
Nitrite: NEGATIVE
Protein, ur: NEGATIVE mg/dL
Specific Gravity, Urine: 1.012 (ref 1.005–1.030)
pH: 6 (ref 5.0–8.0)

## 2024-04-21 MED ORDER — METOPROLOL SUCCINATE ER 25 MG PO TB24
12.5000 mg | ORAL_TABLET | Freq: Every day | ORAL | Status: DC
  Administered 2024-04-21 – 2024-04-25 (×5): 12.5 mg via ORAL
  Filled 2024-04-21 (×5): qty 1

## 2024-04-21 NOTE — Patient Care Conference (Signed)
 Inpatient RehabilitationTeam Conference and Plan of Care Update Date: 04/21/2024   Time: 10:04 AM    Patient Name: Betty Golden      Medical Record Number: [id number]  Date of Birth: Oct 10, 1945 Sex: Female         Room/Bed: 4M03C/4M03C-01 Payor Info: Payor: [employer]ICARE / Plan: BCBS MEDICARE / Product Type: *No Product type* /    Admit Date/Time:  04/17/2024  2:54 PM  Primary Diagnosis:  Acute ischemic left middle cerebral artery (MCA) stroke [facility])  Hospital Problems: Principal Problem:   Acute ischemic left middle cerebral artery (MCA) stroke [facility])    Expected Discharge Date: Expected Discharge Date: 04/26/24  Team Members Present: Social Worker Present: Rhoda Clement, LCSW Nurse Present: Barnie Ronde, RN PT Present: Sherlean Perks, PT;Dominic Borrego Pass, PTA OT Present: Recardo Maxwell, OT SLP Present: Recardo Mole, SLP     Current Status/Progress Goal Weekly Team Focus  Bowel/Bladder   Pt is continent, no urinary retention or constipation noted.   Address bowel/bladder regimen as need it. Assess pt for any discomfort.   Pt will be having regular toileting.    Swallow/Nutrition/ Hydration   reg/thin - some concern for esophageal dysphagia only           ADL's   CGA with mobility and standing balance, CGA to S self care   Mod Ind   ADL training, functional balance and mobility,  pt/fam education    Mobility   Bed Mobility = CGA/supervision; Transfers = CGA with RW; Gait = CGA/light minA; stairs = minA   Supervision  R glute activation to improve gait cycel with L knee in extension, Endurance, NMRE, dual-task, stairs, pt/fam ed    Communication   mild aphasia/apraxia   minA   pt/family education, functional language tasks    Safety/Cognition/ Behavioral Observations  some mild attention deficits   supervision   pt/family education, cognitive re training    Pain               Skin   Pt skin is intact. Pt kept clean, not  wet/soiled for long time- she is continent, Pt's calls answered on time to prevent accidents. Encourage changing of positions while in bed or chair.   No skin breakdown.  Pt's skin will remain inact, clean and dry.      Discharge Planning:  Home with daughter and son in-law daughter is retired and can be there with her. Will set up education and work on discharge needs.   Team Discussion: Patient admitted post left MCA CVA with gait disturbance/motor planning deficits and mild aphasia/apraxia and attention deficits. History of fibromyalgia and rheumatoid arthritis with need inflexion.  Patient on target to meet rehab goals: yes, currently needs supervision for ADLs and bed mobility.  Needs CGA for transfers and min assist for steps. Goals for discharge set for mod I overall.  *See Care Plan and progress notes for long and short-term goals.   Revisions to Treatment Plan:  N/a   Teaching Needs: Safety, medications, dietary modification, transfers, toileting, etc.   Current Barriers to Discharge: Decreased caregiver support  Possible Resolutions to Barriers: Family education DME: shower chair     Medical Summary Current Status: mild aphasia, strength is good, BPs are elevated  Barriers to Discharge: Incontinence   Possible Resolutions to Barriers/Weekly Focus: Check UA, may need abx, also may need BP management   Continued Need for Acute Rehabilitation Level of Care: The patient requires daily medical management by  a physician with specialized training in physical medicine and rehabilitation for the following reasons: Direction of a multidisciplinary physical rehabilitation program to maximize functional independence : Yes Medical management of patient stability for increased activity during participation in an intensive rehabilitation regime.: Yes Analysis of laboratory values and/or radiology reports with any subsequent need for medication adjustment and/or medical  intervention. : Yes   I attest that I was present, lead the team conference, and concur with the assessment and plan of the team.   Fredericka Sober B 04/21/2024, 2:40 PM

## 2024-04-21 NOTE — Progress Notes (Signed)
 Occupational Therapy Session Note  Patient Details  Name: Betty Golden MRN: [id number] Date of Birth: 1946-02-08  Today's Date: 04/21/2024 OT Individual Time: [id number] OT Individual Time Calculation (min): 38 min    Short Term Goals: Week 1:  OT Short Term Goal 1 (Week 1): The pt will safely  complete UB/LB  bathing / dressing with ModI incorporating AE as needed. OT Short Term Goal 2 (Week 1): The pt will safely transfer to all surfaces with ModI incorporating AE as needed. OT Short Term Goal 3 (Week 1): The pt will demonstrate good safety awareness while completing a simple home making task at Mod I using AE as needed. OT Short Term Goal 4 (Week 1): The pt will complete a cognitive activity with accuracy 3 out of  5 attempt with minimal cues following  demonstration and verbal instruction.  Skilled Therapeutic Interventions/Progress Updates:    Pt received in recliner ready for therapy. Pt requested to toilet. Supervision with RW to ambulate to bathroom and toilet.  A hat was in toilet for collection but pt did not void despite feeling that she did. Informed her RN.  Pt then ambulated to the sink to wash hands and clean teeth.  Pt stating she was feeling tired and sore in her body from doing all the extra movement in therapy and requested to not go to the gym.    Brought pt a yellow theraband and had pt sit in armless chair for greater ROM space.  Due to her fibromyalgia, cued her to do as many reps as she felt she could until her extremities were challenged, rest, repeat.   Pt did wide arm band pulls for shoulders/back,  overhead arm reaches for chest expansion, and hip abduction for med glute with band wrapped around thighs.    Pt returned to recliner with all needs met.  Belt alarm on , call light in reach.    Therapy Documentation Precautions:  Precautions Precautions: Fall Recall of Precautions/Restrictions: Impaired Precaution/Restrictions Comments: fall risk and word  salad on occassion Restrictions Weight Bearing Restrictions Per Provider Order: No    Vital Signs: Oxygen Therapy SpO2: 99 % O2 Device: Room Air Pain: Pain Assessment Pain Score: 0-No pain ADL: ADL Equipment Provided: Other (comment) Eating: Set up Where Assessed-Eating: Chair Grooming: Supervision/safety Where Assessed-Grooming: Standing at sink Upper Body Bathing: Supervision/safety Where Assessed-Upper Body Bathing: Shower Lower Body Bathing: Contact guard Where Assessed-Lower Body Bathing: Shower Upper Body Dressing: Setup Where Assessed-Upper Body Dressing: Edge of bed Lower Body Dressing: Minimal assistance Where Assessed-Lower Body Dressing: Chair Toileting: Supervision/safety Where Assessed-Toileting: Teacher, adult education: Close supervision Statistician Method: Proofreader: Other (comment), Grab bars Tub/Shower Transfer: Scientific laboratory technician Method: Ship broker: Information systems manager with back, Acupuncturist: Administrator, arts Method: Designer, industrial/product: Shower seat with back   Therapy/Group: Individual Therapy  [name] 04/21/2024, 9:42 AM

## 2024-04-21 NOTE — Progress Notes (Signed)
 Occupational Therapy Session Note  Patient Details  Name: MASSIE COGLIANO MRN: [id number] Date of Birth: [birth year]  Today's Date: 04/21/2024 OT Individual Time: 8696-8652 OT Individual Time Calculation (min): 44 min    Short Term Goals: Week 1:  OT Short Term Goal 1 (Week 1): The pt will safely  complete UB/LB  bathing / dressing with ModI incorporating AE as needed. OT Short Term Goal 2 (Week 1): The pt will safely transfer to all surfaces with ModI incorporating AE as needed. OT Short Term Goal 3 (Week 1): The pt will demonstrate good safety awareness while completing a simple home making task at Mod I using AE as needed. OT Short Term Goal 4 (Week 1): The pt will complete a cognitive activity with accuracy 3 out of  5 attempt with minimal cues following  demonstration and verbal instruction.  Skilled Therapeutic Interventions/Progress Updates:   Pt greeted seated in recliner, pt agreeable to OT intervention but requested to stay in the room and work on her UB.   Transfers/bed mobility/functional mobility: pt completed sit>stands with no AD and CGA- supervision.   Exercises:  Pt completed below seated BUE therex with 2lb weighted dowel for 1 min each to facilitate improved global endurance:  Chest presses Overhead presses Bicep curls  Pt completed below standing BUE therex with 2 lb weighted dowel for 1 min each to challenge standing balance and improve global endurance: Attempted to have pt combo move of chest presses and over head press however pt reports this to be too challenging Horizontal shoulder abd/add  Pt completed below resistance exercises with level 1 theraband from seated position for 1 min for each exercise: Forward rows Over head pull downs  Ended session with pt seated in recliner with all needs within reach and chair alarm activated.                   Therapy Documentation Precautions:  Precautions Precautions: Fall Recall of Precautions/Restrictions:  Impaired Precaution/Restrictions Comments: fall risk and word salad on occassion Restrictions Weight Bearing Restrictions Per Provider Order: No  Pain: No pain indicated   Therapy/Group: Individual Therapy  Ronal Gift [facility] 04/21/2024, 3:06 PM

## 2024-04-21 NOTE — Plan of Care (Signed)
  Problem: RH Simple Meal Prep Goal: LTG Patient will perform simple meal prep w/assist (OT) Description: LTG: Patient will perform simple meal prep with assistance, with/without cues (OT). Flowsheets (Taken 04/21/2024 1011) LTG: Pt will perform simple meal prep with assistance level of: (LTG discontinued as pt states she does not do the meal prep  at home, her daughter does the meal prep.) -- Note: LTG discontinued as pt states she does not do the meal prep at home, her daughter does the meal prep.    Problem: RH Light Housekeeping Goal: LTG Patient will perform light housekeeping w/assist (OT) Description: LTG: Patient will perform light housekeeping with assistance, with/without cues (OT). Flowsheets (Taken 04/21/2024 1011) LTG: Pt will perform light housekeeping with assistance level of: (LTG discontinued as pt states she does not do the house keeping at home, her daughter does the house keeping.) -- Note: LTG discontinued as pt states she does not do the house keeping at home, her daughter does the house keeping.    Problem: RH Tub/Shower Transfers Goal: LTG Patient will perform tub/shower transfers w/assist (OT) Description: LTG: Patient will perform tub/shower transfers with assist, with/without cues using equipment (OT) Flowsheets (Taken 04/21/2024 1011) LTG: Pt will perform tub/shower stall transfers with assistance level of: (pt downgraded as pt should have S for safety) Supervision/Verbal cueing Note: pt downgraded as pt should have S for safety

## 2024-04-21 NOTE — Progress Notes (Signed)
 Patient refused all meals today. She did drink 1/2 of Ensure after lunch and another one was just provided to her as she is currently drinking it.

## 2024-04-21 NOTE — Progress Notes (Signed)
 Physical Therapy Session Note  Patient Details  Name: Betty Golden MRN: [id number] Date of Birth: 1945-12-24  Today's Date: 04/21/2024 PT Individual Time: 0810-0903 PT Individual Time Calculation (min): 53 min   Short Term Goals: Week 1:  PT Short Term Goal 1 (Week 1): STGs = LTGs  SESSION 1 Skilled Therapeutic Interventions/Progress Updates: Patient supine in bed on entrance to room. Patient alert and agreeable to PT session.   Patient reported no pain and endorses ability to sleep well the previous night. Pt missed 10 min due to nsg assisting with doffing gown and donning personal clothes (privacy given to allow female nsg to assist to maintain pt's modesty).   Therapeutic Activity: Bed Mobility: Pt performed supine<sit on EOB with supervision and HOB slightly elevated, and supervision when performing supine<sit to edge of mat, and CGA from sit<supine for safety (in main gym). Pt sat EOB and donned L sock/shoe but required maxA to donn R sock/shoe this session due to decreased ability to achieve adequate R external rotation at hips vs L. Transfers: Pt performed sit<>stand transfers throughout session with light CGA/supervision for safety. Provided VC for hand placement  Gait Training:  Pt ambulated from room<>main gym using RW with CGA. Pt demonstrated the following gait deviations with therapist providing the described cuing and facilitation for improvement:  - multimodal cues to increase R glute activation to increase mechanics of L knee in extension accordingly during gait cycle  Neuromuscular Re-ed: NMR facilitated during session with focus on neuromuscular connection/motor plan of R glute. - Supine bridges with L LE extended further out to bias R glute activation with PTA originally assisting with approximating at B knees (pt able to maintain set up without assistance on last few reps). Pt required rest break  - Supine bridge with L LE in full extension to solely emphasize R  glutes vs L with VC to increase ROM. Pt presented with increased difficulty with motor planning glute activation on R (would attempt to flex R hip vs extend to bridge R hip but was able to do so easily with L LE). Intervention deferred to functional sit<>stand with yellow theraband tied to lower back R of chair leg to promote increase in R glute activation with noted improvement. Pt required pad behind B glutes as tactile feedback to promote hip hinge with pt cued to perform only hip hinge without sitting and then cued to extend to upright posture. Pt required seated rest break  NMR performed for improvements in motor control and coordination, balance, sequencing, judgement, and self confidence/ efficacy in performing all aspects of mobility at highest level of independence.   Patient sitting in recliner at end of session with brakes locked, belt alarm set, and all needs within reach.      Therapy Documentation Precautions:  Precautions Precautions: Fall Recall of Precautions/Restrictions: Impaired Precaution/Restrictions Comments: fall risk and word salad on occassion Restrictions Weight Bearing Restrictions Per Provider Order: No  Therapy/Group: Individual Therapy  Liliani Bobo PTA 04/21/2024, 12:20 PM

## 2024-04-21 NOTE — Progress Notes (Signed)
 Speech Language Pathology Daily Session Note  Patient Details  Name: Betty Golden MRN: [id number] Date of Birth: 02-01-1946  Today's Date: 04/21/2024 SLP Individual Time: 1032-1117 SLP Individual Time Calculation (min): 45 min  Short Term Goals: Week 1: SLP Short Term Goal 1 (Week 1): STG = LTG due to ELOS  Skilled Therapeutic Interventions:  Patient was seen in am to address expressive and receptive language as well as motor speech. Pt was alert and seated upright in recliner upon SLP arrival. She was agreeable for session. SLP addressing receptive language through targeting pt comprehension of complex yes/no questions. Pt accurately responded with 67% acc improving to 83% acc given min cues. In other minutes of session, SLP addressed naming through a responsive naming task. She completed task with 83% acc. SLP addressing motor speech through challenging pt at syllable level. Most difficulty noted with /st/ blend sounds across one, two, and three syllable words. She also demonstrated greatest difficulty with three syllable words though demonstrated some success with cues slow pace. Pt was left upright in recliner with chair alarm active and call button within reach. SLP to continue POC.   Pain Pain Assessment Pain Scale: 0-10 Pain Score: 0-No pain  Therapy/Group: Individual Therapy  [name] 04/21/2024, 10:38 AM

## 2024-04-21 NOTE — Progress Notes (Signed)
 PROGRESS NOTE   Subjective/Complaints:  Reviewed Hgb , stable appreciate PT note No incont recorded  ROS: as per HPI. Denies CP, SOB, abd pain, N/V/D/C, or any other complaints at this time.    Objective:   No results found. Recent Labs    04/19/24 0504 04/20/24 0457  WBC 10.9*  --   HGB 11.0* 10.9*  HCT 33.0* 32.8*  PLT 202  --    Recent Labs    04/19/24 0504  NA 140  K 3.5  CL 110  CO2 20*  GLUCOSE 83  BUN 9  CREATININE 1.10*  CALCIUM  9.3        Intake/Output Summary (Last 24 hours) at 04/21/2024 0758 Last data filed at 04/20/2024 1800 Gross per 24 hour  Intake 240 ml  Output --  Net 240 ml        Physical Exam: Vital Signs Blood pressure (!) 154/76, pulse 98, temperature 97.9 F (36.6 C), resp. rate 18, height 5' 3 (1.6 m), weight 69.1 kg, SpO2 99%.   General: No acute distress Mood and affect are appropriate Heart: Regular rate and rhythm no rubs murmurs or extra sounds Lungs: Clear to auscultation, breathing unlabored, no rales or wheezes Abdomen: Positive bowel sounds, soft nontender to palpation, nondistended Extremities: No clubbing, cyanosis, or edema Skin: No evidence of breakdown, no evidence of rash Neurologic: Cranial nerves II through XII intact,Sensory exam difficult to assess due to aphasia  Cerebellar exam normal finger to nose to finger  Musculoskeletal: Full range of motion in all 4 extremities. No joint swelling    Neuro: Mental Status: AAOx to day, for month before October, for year able to get with choices, unable to say her name   Speech/Language: Aphasia more expressive than receptive, cannot state day and date due to naming issues   Neuro:  Eyes without evidence of nystagmus  Tone is normal without evidence of spasticity  No evidence of trunkal ataxia  Motor strength is 5/5 in bilateral deltoid, biceps, triceps, finger flexors and extensors, wrist flexors and  extensors, hip flexors, knee flexors and extensors, ankle dorsiflexors, plantar flexors, invertors and evertors, toe flexors and extensors           REFLEXES: No ankle clonus   SENSORY: Normal to touch all 4 extremities   Coordination: Normal finger to nose bilaterally  Assessment/Plan: 1. Functional deficits which require 3+ hours per day of interdisciplinary therapy in a comprehensive inpatient rehab setting. Physiatrist is providing close team supervision and 24 hour management of active medical problems listed below. Physiatrist and rehab team continue to assess barriers to discharge/monitor patient progress toward functional and medical goals  Care Tool:  Bathing    Body parts bathed by patient: Right arm, Left arm, Chest, Abdomen, Front perineal area, Buttocks, Right upper leg, Left upper leg, Right lower leg, Left lower leg, Face         Bathing assist Assist Level: Contact Guard/Touching assist     Upper Body Dressing/Undressing Upper body dressing   What is the patient wearing?: Bra, Pull over shirt    Upper body assist Assist Level: Set up assist    Lower Body Dressing/Undressing Lower body dressing  What is the patient wearing?: Underwear/pull up, Pants     Lower body assist Assist for lower body dressing: Minimal Assistance - Patient > 75%     Toileting Toileting    Toileting assist Assist for toileting: Supervision/Verbal cueing     Transfers Chair/bed transfer  Transfers assist     Chair/bed transfer assist level: Contact Guard/Touching assist     Locomotion Ambulation   Ambulation assist      Assist level: Contact Guard/Touching assist Assistive device: Walker-rolling Max distance: 150   Walk 10 feet activity   Assist     Assist level: Contact Guard/Touching assist Assistive device: Walker-rolling   Walk 50 feet activity   Assist    Assist level: Contact Guard/Touching assist Assistive device: Walker-rolling     Walk 150 feet activity   Assist    Assist level: Contact Guard/Touching assist Assistive device: Walker-rolling    Walk 10 feet on uneven surface  activity   Assist Walk 10 feet on uneven surfaces activity did not occur: Safety/medical concerns   Assist level: Contact Guard/Touching assist Assistive device: Walker-rolling   Wheelchair     Assist Is the patient using a wheelchair?: Yes (per PT Evaluation documentation) Type of Wheelchair: Manual    Wheelchair assist level: Supervision/Verbal cueing Max wheelchair distance: 150    Wheelchair 50 feet with 2 turns activity    Assist        Assist Level: Supervision/Verbal cueing   Wheelchair 150 feet activity     Assist      Assist Level: Supervision/Verbal cueing   Blood pressure (!) 154/76, pulse 98, temperature 97.9 F (36.6 C), resp. rate 18, height 5' 3 (1.6 m), weight 69.1 kg, SpO2 99%.  Medical Problem List and Plan: 1. 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  Patient did not tolerate MRI             -patient may shower             -ELOS/Goals: 10-12 days, PT/OT/SLP supervision             -Admit to CIR 2.  Antithrombotics: -DVT/anticoagulation:  Pharmaceutical: Lovenox  40mg  daily -antiplatelet therapy: DAPT X 3 months followed by ASA alone.  Confirmed in Neuro note 9/18 3. Pain Management:  Tylenol  prn.  4. Mood/Behavior/Sleep: LCSW to follow for evaluation and support --trazodone  25 mg at bedtime. Ativan  prn for anxiety/agitation due to aphasia?   -04/18/24 still having trouble sleeping-- scheduled melatonin 5mg              -antipsychotic agents: has had haldol  prn--last used on 09/18. Not reordered -DC summary indicates cymbalta  60mg  daily, does not appear she got this at the acute hospital. Pt states she uses 30mg  BID-- reordered 5. Neuropsych/cognition: This patient may be capable of making decisions on her own behalf. 6.  Skin/Wound Care: Routine pressure relief measures.             -- Monitor skin integrity 7. Fluids/Electrolytes/Nutrition: Monitor I/O. Routine labs. Vitamins/supplements.     Latest Ref Rng & Units 04/19/2024    5:04 AM 04/18/2024    6:55 AM 04/17/2024    5:26 AM  BMP  Glucose 70 - 99 mg/dL 83  93  891   BUN 8 - 23 mg/dL 9  9  10    Creatinine 0.44 - 1.00 mg/dL 8.89  8.86  8.92   Sodium 135 - 145 mmol/L 140  140  141  Potassium 3.5 - 5.1 mmol/L 3.5  3.8  3.6   Chloride 98 - 111 mmol/L 110  110  111   CO2 22 - 32 mmol/L 20  19  19    Calcium  8.9 - 10.3 mg/dL 9.3  9.7  9.4     8. Diverticulitis/Colitis: Transitioned to cipro  500mg  bid and Flagyl  500 tid orally on 9/18, IV abx from 9/15-9/18 -WBC trending down today but also had drop in H/H. Monitor for signs of bleeding.               - Follow-up with gastroenterologist outpatient Cipro  and Metronidazole  until 9/27  9. HTN: Monitor BP TID--avoid hypoperfusion. BP meds have been resumed.  Monitor with activity. --On amlodipine  10mg  daily -04/18/24 BP variable, leave meds as is for now, monitor  Vitals:   04/17/24 1954 04/18/24 0336 04/18/24 1228 04/18/24 1419  BP: (!) 145/98 (!) 161/92 (!) 143/66 (!) 160/78   04/18/24 2011 04/19/24 0535 04/19/24 1303 04/19/24 2026  BP: (!) 158/70 136/74 (!) 152/75 (!) 156/81   04/20/24 0310 04/20/24 1336 04/20/24 1900 04/21/24 0257  BP: (!) 148/85 (!) 151/71 (!) 152/73 (!) 154/76    10. COPD: On Breztri  bid with Fasenra  every 8 weeks. 11. RA: Stable on Leflunomide  20mg  daily.  12. Hypokalemia: Being supplemented daily but still low -04/18/24 K 3.8, monitor routine labs 13. Hypomagnesemia: 1.8 on recheck 9/21, monitor as needed 14.  ABLA: Monitor for rectal bleeding. Hgb down from 12.8 to 11.2. Back up to 13.0 on 9/20-- 12.8 9/21, monitor routinely     Latest Ref Rng & Units 04/20/2024    4:57 AM 04/19/2024    5:04 AM 04/18/2024    6:55 AM  CBC  WBC 4.0 - 10.5 K/uL  10.9  13.6   Hemoglobin 12.0 -  15.0 g/dL 89.0  88.9  87.1   Hematocrit 36.0 - 46.0 % 32.8  33.0  39.1   Platelets 150 - 400 K/uL  202  216    Check stool OB, repeat H and H stable  15, Delirium: Resolving but needed ativan   16. Leukocytosis: improving only borderline elevation  17. HLD: continue rosuvastatin  20mg  daily 18. GERD: protonix  40mg  daily.     LOS: 4 days A FACE TO FACE EVALUATION WAS PERFORMED  Betty Golden 04/21/2024, 7:58 AM

## 2024-04-22 LAB — CBC
HCT: 36.7 % (ref 36.0–46.0)
Hemoglobin: 11.9 g/dL — ABNORMAL LOW (ref 12.0–15.0)
MCH: 28.3 pg (ref 26.0–34.0)
MCHC: 32.4 g/dL (ref 30.0–36.0)
MCV: 87.2 fL (ref 80.0–100.0)
Platelets: 207 K/uL (ref 150–400)
RBC: 4.21 MIL/uL (ref 3.87–5.11)
RDW: 13.9 % (ref 11.5–15.5)
WBC: 10.2 K/uL (ref 4.0–10.5)
nRBC: 0 % (ref 0.0–0.2)

## 2024-04-22 LAB — HEPATIC FUNCTION PANEL
ALT: 58 U/L — ABNORMAL HIGH (ref 0–44)
AST: 70 U/L — ABNORMAL HIGH (ref 15–41)
Albumin: 3.4 g/dL — ABNORMAL LOW (ref 3.5–5.0)
Alkaline Phosphatase: 39 U/L (ref 38–126)
Bilirubin, Direct: <0.1 mg/dL (ref 0.0–0.2)
Total Bilirubin: 0.8 mg/dL (ref 0.0–1.2)
Total Protein: 6.2 g/dL — ABNORMAL LOW (ref 6.5–8.1)

## 2024-04-22 LAB — BASIC METABOLIC PANEL WITH GFR
Anion gap: 8 (ref 5–15)
BUN: 6 mg/dL — ABNORMAL LOW (ref 8–23)
CO2: 23 mmol/L (ref 22–32)
Calcium: 9.3 mg/dL (ref 8.9–10.3)
Chloride: 108 mmol/L (ref 98–111)
Creatinine, Ser: 1.11 mg/dL — ABNORMAL HIGH (ref 0.44–1.00)
GFR, Estimated: 52 mL/min — ABNORMAL LOW (ref >60)
Glucose, Bld: 78 mg/dL (ref 70–99)
Potassium: 3 mmol/L — ABNORMAL LOW (ref 3.5–5.1)
Sodium: 139 mmol/L (ref 135–145)

## 2024-04-22 MED ORDER — ENSURE PLUS HIGH PROTEIN PO LIQD
237.0000 mL | Freq: Three times a day (TID) | ORAL | Status: DC
  Administered 2024-04-22 – 2024-04-28 (×13): 237 mL via ORAL

## 2024-04-22 MED ORDER — POTASSIUM CHLORIDE CRYS ER 20 MEQ PO TBCR
40.0000 meq | EXTENDED_RELEASE_TABLET | Freq: Two times a day (BID) | ORAL | Status: DC
  Administered 2024-04-22 – 2024-04-26 (×10): 40 meq via ORAL
  Filled 2024-04-22 (×10): qty 2

## 2024-04-22 NOTE — Progress Notes (Signed)
 Speech Language Pathology Daily Session Note  Patient Details  Name: Betty Golden MRN: [id number] Date of Birth: 05/15/1946  Today's Date: 04/22/2024 SLP Individual Time: 0815-0915 SLP Individual Time Calculation (min): 60 min  Short Term Goals: Week 1: SLP Short Term Goal 1 (Week 1): STG = LTG due to ELOS  Skilled Therapeutic Interventions:   SLP conducted skilled therapy session targeting communication goals. Upon entry, pt indicated need to void, thus ambulated to commode using RW CGA where she was continent of bladder. SLP facilitated antonym generation/phrase completion task. Patient min to mod for generating opposite words. Patient benefited from phonemic cues to improve accuracy. Patient notes that her head feels jumbled this date and requested to move on to new task/target area. SLP initiated mildly complex task to target receptive language through written, two-step commands. Patient required mod assist for accurate command execution and to break through moments of perseveration. Patient required increased cues when instructions contained spatial concepts like above, next, and in front. Throughout session, prompted patient in conversation re: biographical information and recent events. Patient's output was characterized by mildly empty speech, apraxic errors, and vowel distortions with patient aware of errors and increasingly frustrated throughout with expressive language attempts. Patient was left in room with call bell in reach and alarm set. SLP will continue to target goals per plan of care.    Pain   none  Therapy/Group: Individual Therapy  Ashley A Ellin 04/22/2024, 9:26 AM

## 2024-04-22 NOTE — Progress Notes (Signed)
 Occupational Therapy Session Note  Patient Details  Name: Betty Golden MRN: [id number] Date of Birth: 01/23/1946  Today's Date: 04/22/2024 OT Individual Time: 8595-8557 OT Individual Time Calculation (min): 38 min  22 mins missed d/t fatigue   Short Term Goals: Week 1:  OT Short Term Goal 1 (Week 1): The pt will safely  complete UB/LB  bathing / dressing with ModI incorporating AE as needed. OT Short Term Goal 2 (Week 1): The pt will safely transfer to all surfaces with ModI incorporating AE as needed. OT Short Term Goal 3 (Week 1): The pt will demonstrate good safety awareness while completing a simple home making task at Mod I using AE as needed. OT Short Term Goal 4 (Week 1): The pt will complete a cognitive activity with accuracy 3 out of  5 attempt with minimal cues following  demonstration and verbal instruction.  Skilled Therapeutic Interventions/Progress Updates:  Pt greeted seated in recliner, pt agreeable to OT intervention.      Transfers/bed mobility/functional mobility:  Pt completed functional ambulation in room with RW and CGA.   ADLs:  UB dressing: pt donned [place] shirt and night coat with set- up assist.  LB dressing: pt donned underwear from recliner with CGA for balance Footwear: pt donned socks with MODA needing assist to don R sock  Bathing: pt completed bathing from sitting/standing on TTB with CGA for standing balance.  Transfers: ambulatory ADL transfers with RW and CGA.  Toileting: pt with continent urine/ bowel void completing 3/3 toileting tasks with supervision.   After shower, pt declined engaging in session further d/t/ fatigue. Pt missed 22 mins of session d/t fatigue.                 Ended session with pt seated in recliner with all needs within reach.  Precautions:  Precautions Precautions: Fall Recall of Precautions/Restrictions: Impaired Precaution/Restrictions Comments: fall risk and word salad on occassion Restrictions Weight Bearing  Restrictions Per Provider Order: No  Pain: No pain    Therapy/Group: Individual Therapy  [name] 04/22/2024, 2:53 PM

## 2024-04-22 NOTE — Progress Notes (Signed)
 PROGRESS NOTE   Subjective/Complaints:  Reviewed Hgb , stable appreciate PT note No incont recorded  ROS: as per HPI. Denies CP, SOB, abd pain, N/V/D/C, or any other complaints at this time.    Objective:   No results found. Recent Labs    04/20/24 0457 04/22/24 0549  WBC  --  10.2  HGB 10.9* 11.9*  HCT 32.8* 36.7  PLT  --  207   Recent Labs    04/22/24 0549  NA 139  K 3.0*  CL 108  CO2 23  GLUCOSE 78  BUN 6*  CREATININE 1.11*  CALCIUM  9.3        Intake/Output Summary (Last 24 hours) at 04/22/2024 0732 Last data filed at 04/21/2024 1820 Gross per 24 hour  Intake 340 ml  Output --  Net 340 ml        Physical Exam: Vital Signs Blood pressure (!) 141/65, pulse (!) 104, temperature 98.5 F (36.9 C), resp. rate 18, height 5' 3 (1.6 m), weight 69.1 kg, SpO2 93%.   General: No acute distress Mood and affect are appropriate Heart: Regular rate and rhythm no rubs murmurs or extra sounds Lungs: Clear to auscultation, breathing unlabored, no rales or wheezes Abdomen: Positive bowel sounds, soft nontender to palpation, nondistended Extremities: No clubbing, cyanosis, or edema Skin: No evidence of breakdown, no evidence of rash Neurologic: Cranial nerves II through XII intact,Sensory exam difficult to assess due to aphasia  Cerebellar exam normal finger to nose to finger  Musculoskeletal: Full range of motion in all 4 extremities. No joint swelling    Neuro: Mental Status: AAOx to day, for month before October, for year able to get with choices, unable to say her name   Speech/Language: Aphasia more expressive than receptive, cannot state day and date due to naming issues   Neuro:  Eyes without evidence of nystagmus  Tone is normal without evidence of spasticity  No evidence of trunkal ataxia  Motor strength is 5/5 in bilateral deltoid, biceps, triceps, finger flexors and extensors, wrist flexors  and extensors, hip flexors, knee flexors and extensors, ankle dorsiflexors, plantar flexors, invertors and evertors, toe flexors and extensors           REFLEXES: No ankle clonus   SENSORY: Normal to touch all 4 extremities   Coordination: Normal finger to nose bilaterally  Assessment/Plan: 1. Functional deficits which require 3+ hours per day of interdisciplinary therapy in a comprehensive inpatient rehab setting. Physiatrist is providing close team supervision and 24 hour management of active medical problems listed below. Physiatrist and rehab team continue to assess barriers to discharge/monitor patient progress toward functional and medical goals  Care Tool:  Bathing    Body parts bathed by patient: Right arm, Left arm, Chest, Abdomen, Front perineal area, Buttocks, Right upper leg, Left upper leg, Right lower leg, Left lower leg, Face         Bathing assist Assist Level: Contact Guard/Touching assist     Upper Body Dressing/Undressing Upper body dressing   What is the patient wearing?: Bra, Pull over shirt    Upper body assist Assist Level: Set up assist    Lower Body Dressing/Undressing Lower body  dressing      What is the patient wearing?: Underwear/pull up, Pants     Lower body assist Assist for lower body dressing: Minimal Assistance - Patient > 75%     Toileting Toileting    Toileting assist Assist for toileting: Supervision/Verbal cueing     Transfers Chair/bed transfer  Transfers assist     Chair/bed transfer assist level: Contact Guard/Touching assist     Locomotion Ambulation   Ambulation assist      Assist level: Contact Guard/Touching assist Assistive device: Walker-rolling Max distance: 150   Walk 10 feet activity   Assist     Assist level: Contact Guard/Touching assist Assistive device: Walker-rolling   Walk 50 feet activity   Assist    Assist level: Contact Guard/Touching assist Assistive device:  Walker-rolling    Walk 150 feet activity   Assist    Assist level: Contact Guard/Touching assist Assistive device: Walker-rolling    Walk 10 feet on uneven surface  activity   Assist Walk 10 feet on uneven surfaces activity did not occur: Safety/medical concerns   Assist level: Contact Guard/Touching assist Assistive device: Walker-rolling   Wheelchair     Assist Is the patient using a wheelchair?: Yes (per PT Evaluation documentation) Type of Wheelchair: Manual    Wheelchair assist level: Supervision/Verbal cueing Max wheelchair distance: 150    Wheelchair 50 feet with 2 turns activity    Assist        Assist Level: Supervision/Verbal cueing   Wheelchair 150 feet activity     Assist      Assist Level: Supervision/Verbal cueing   Blood pressure (!) 141/65, pulse (!) 104, temperature 98.5 F (36.9 C), resp. rate 18, height 5' 3 (1.6 m), weight 69.1 kg, SpO2 93%.  Medical Problem List and Plan: 1. 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  Patient did not tolerate MRI             -patient may shower             -ELOS/Goals: 10-12 days, PT/OT/SLP supervision             -Admit to CIR 2.  Antithrombotics: -DVT/anticoagulation:  Pharmaceutical: Lovenox  40mg  daily -antiplatelet therapy: DAPT X 3 months followed by ASA alone.  Confirmed in Neuro note 9/18 3. Pain Management:  Tylenol  prn.  4. Mood/Behavior/Sleep: LCSW to follow for evaluation and support --trazodone  25 mg at bedtime. Ativan  prn for anxiety/agitation due to aphasia?   -04/18/24 still having trouble sleeping-- scheduled melatonin 5mg              -antipsychotic agents: has had haldol  prn--last used on 09/18. Not reordered -DC summary indicates cymbalta  60mg  daily, does not appear she got this at the acute hospital. Pt states she uses 30mg  BID-- reordered 5. Neuropsych/cognition: This patient may be capable of making decisions on her  own behalf. 6. Skin/Wound Care: Routine pressure relief measures.             -- Monitor skin integrity 7. Fluids/Electrolytes/Nutrition: Monitor I/O. Routine labs. Vitamins/supplements.     Latest Ref Rng & Units 04/22/2024    5:49 AM 04/19/2024    5:04 AM 04/18/2024    6:55 AM  BMP  Glucose 70 - 99 mg/dL 78  83  93   BUN 8 - 23 mg/dL 6  9  9    Creatinine 0.44 - 1.00 mg/dL 8.88  8.89  8.86   Sodium 135 - 145  mmol/L 139  140  140   Potassium 3.5 - 5.1 mmol/L 3.0  3.5  3.8   Chloride 98 - 111 mmol/L 108  110  110   CO2 22 - 32 mmol/L 23  20  19    Calcium  8.9 - 10.3 mg/dL 9.3  9.3  9.7     8. Diverticulitis/Colitis: Transitioned to cipro  500mg  bid and Flagyl  500 tid orally on 9/18, IV abx from 9/15-9/18 -WBC trending down today but also had drop in H/H. Monitor for signs of bleeding.               - Follow-up with gastroenterologist outpatient Cipro  and Metronidazole  until 9/27  9. HTN: Monitor BP TID--avoid hypoperfusion. BP meds have been resumed.  Monitor with activity. --On amlodipine  10mg  daily -04/18/24 BP variable, leave meds as is for now, monitor  Vitals:   04/19/24 1303 04/19/24 2026 04/20/24 0310 04/20/24 1336  BP: (!) 152/75 (!) 156/81 (!) 148/85 (!) 151/71   04/20/24 1900 04/21/24 0257 04/21/24 0745 04/21/24 1207  BP: (!) 152/73 (!) 154/76 (!) 140/73 128/69   04/21/24 1317 04/21/24 2002 04/22/24 0307 04/22/24 0542  BP: (P) 132/65 138/72 129/69 (!) 141/65    10. COPD: On Breztri  bid with Fasenra  every 8 weeks. 11. RA: Stable on Leflunomide  20mg  daily.  12. Hypokalemia: Being supplemented daily but still low Recurrent low KCL supplementation 9/25 x2 , recheck 9/26 13. Hypomagnesemia: 1.8 on recheck 9/21, monitor as needed 14.  ABLA: Monitor for rectal bleeding. Hgb down from 12.8 to 11.2. Back up to 13.0 on 9/20-- 12.8 9/21, monitor routinely     Latest Ref Rng & Units 04/22/2024    5:49 AM 04/20/2024    4:57 AM 04/19/2024    5:04 AM  CBC  WBC 4.0 - 10.5 K/uL  10.2   10.9   Hemoglobin 12.0 - 15.0 g/dL 88.0  89.0  88.9   Hematocrit 36.0 - 46.0 % 36.7  32.8  33.0   Platelets 150 - 400 K/uL 207   202    Check stool OB, repeat H and H stable /improving so may d/c order  15, Delirium: Resolving but needed ativan   16. Leukocytosis: resolved on abx for diverticulitis 17. HLD: continue rosuvastatin  20mg  daily 18. GERD: protonix  40mg  daily.   19.  Poor appetite likely due to diverticulitis and abx  LOS: 5 days A FACE TO FACE EVALUATION WAS PERFORMED  Prentice FORBES Compton 04/22/2024, 7:32 AM

## 2024-04-22 NOTE — Progress Notes (Signed)
 Occupational Therapy Note  Patient Details  Name: CAPRIA CARTAYA MRN: [id number] Date of Birth: [birth year]  Today's Date: 04/22/2024 OT Missed Time: 30 Minutes Missed Time Reason: Patient fatigue  Pt greeted seated in recliner,  Pt aphasic but seems to be stating that she had a rough night and is just now starting to feel better. Pt declined session even with various options provided I.e therex, ADLs, functional mobility. Will f/u this afternoon for additional OT session.    Ronal Gift Elliemae Braman 04/22/2024, 12:07 PM

## 2024-04-22 NOTE — Progress Notes (Signed)
 Physical Therapy Session Note  Patient Details  Name: Betty Golden MRN: [id number] Date of Birth: [birth year]  Today's Date: 04/22/2024  Short Term Goals: Week 1:  PT Short Term Goal 1 (Week 1): STGs = LTGs  Pt missed 60 min of skilled therapy due to pt feeling unwell. Pt encouraged to participate as able to progress functional mobility and to avoid missing minutes. Pt politely declined to rest for upcoming sessions. Pt also reported decrease in appetite but endorses drinking water . NSG notified. Will re-attempt as schedule and pt availability permits.     Therapy Documentation Precautions:  Precautions Precautions: Fall Recall of Precautions/Restrictions: Impaired Precaution/Restrictions Comments: fall risk and word salad on occassion Restrictions Weight Bearing Restrictions Per Provider Order: No  Therapy/Group: Individual Therapy  Saria Haran PTA 04/22/2024, 7:49 AM

## 2024-04-23 LAB — POTASSIUM: Potassium: 3.9 mmol/L (ref 3.5–5.1)

## 2024-04-23 NOTE — Progress Notes (Signed)
 Occupational Therapy Session Note  Patient Details  Name: Betty Golden MRN: [id number] Date of Birth: Jun 01, 1946  Today's Date: 04/23/2024 OT Individual Time: 0950-1100 OT Individual Time Calculation (min): 70 min    Short Term Goals: Week 1:  OT Short Term Goal 1 (Week 1): The pt will safely  complete UB/LB  bathing / dressing with ModI incorporating AE as needed. OT Short Term Goal 2 (Week 1): The pt will safely transfer to all surfaces with ModI incorporating AE as needed. OT Short Term Goal 3 (Week 1): The pt will demonstrate good safety awareness while completing a simple home making task at Mod I using AE as needed. OT Short Term Goal 4 (Week 1): The pt will complete a cognitive activity with accuracy 3 out of  5 attempt with minimal cues following  demonstration and verbal instruction.  Skilled Therapeutic Interventions/Progress Updates:    Pt received in bed and stated she was feeling much better today. Pt agreeable to a shower.   Pt needed to toilet first and had an accident in her pull ups. Pt stated she was on a medication that was making her go a lot.  Pt was supervision with all mobility and self care today with the RW, except for R foot to don sock and shoe. She stated bending over was hurting her hip.  Had not bothered her the other day.  Tried to have pt prop leg up on bed but she said she could not do that.  Introduced a International aid/development worker to pt and she was able to use that to don sock without help.  Replaced shoe laces with elastic laces. Pt liked this as she did not have to bend to her R foot. She was able to do L side without difficulty.  Pt continues to have expressive aphasia, it is improving gradually.  Discussed the amount of supervision she should have at home, pt stated her family will be there.     Had pt practice saying first part of her last name, she could not say last part as she was having difficulty with the cl sound.    Pt states she has to modify her activity  at home due to fibromyalgia.  Pt resting in recliner with all needs met and belt alarm on.   Therapy Documentation Precautions:  Precautions Precautions: Fall Recall of Precautions/Restrictions: Impaired Precaution/Restrictions Comments: fall risk and word salad on occassion Restrictions Weight Bearing Restrictions Per Provider Order: No      Pain: Pain Assessment Pain Scale: 0-10 Pain Score: 0-No pain ADL: ADL Equipment Provided:  (RW) Eating: Independent Where Assessed-Eating: Chair Grooming: Independent Where Assessed-Grooming: Standing at sink Upper Body Bathing: Independent Where Assessed-Upper Body Bathing: Shower Lower Body Bathing: Supervision/safety Where Assessed-Lower Body Bathing: Shower Upper Body Dressing: Independent Where Assessed-Upper Body Dressing: Edge of bed Lower Body Dressing: Supervision/safety Where Assessed-Lower Body Dressing: Chair Toileting: Supervision/safety Where Assessed-Toileting: Teacher, adult education: Close supervision Statistician Method: Proofreader: Other (comment), Grab bars Tub/Shower Transfer: Close supervison Web designer Method: Ship broker: Information systems manager with back, Acupuncturist: Administrator, arts Method: Designer, industrial/product: Shower seat with back   Therapy/Group: Individual Therapy  Betty Golden 04/23/2024, 12:30 PM

## 2024-04-23 NOTE — Progress Notes (Signed)
 Physical Therapy Session Note  Patient Details  Name: Betty Golden MRN: [id number] Date of Birth: [birth year]  Today's Date: 04/23/2024 PT Individual Time: 1304-1400 PT Individual Time Calculation (min): 56 min   Short Term Goals: Week 1:  PT Short Term Goal 1 (Week 1): STGs = LTGs  Skilled Therapeutic Interventions/Progress Updates: Patient sitting in recliner on entrance to room. Patient alert and agreeable to PT session.   Patient reported feeling some tightness in L hip flexor and increase in weakness in B LE's. Pt reported that this happens when her fibromyalgia begins to flare up. Pt performed sit<>stand transfers with CGA for safety to RW and with increased time required to pivot due to weakness. Pt attempted to ambulate out of room in RW but deferred due to pt's presentation of significant decrease in cadence, and increase in time required to advance LE's. Pt transported to day room gym in [facility] dependently. Pt on NuStep and required multiple rest breaks throughout due to increased exertion. Pt started on level 2 resistance at 50 spm pace with partner that decreased to 30 spm and level 1 to avoid over exertion. 343 steps; 1.4 METs 0.2 miles Pt propelled WC around nsg/day room loop (roughly 160') with supervision in order to increase endurance and B UE strength. VC required to ensure locks on brakes are fully engaged. Pt required 2 rest breaks due to exertion. Pt transported back to room dependently in [facility] due to fatigue. PTA suggested aquatic PT at OP facility to assist with pt's fibromyalgia when it exacerbates with pt stating desire to do so (PTA communicated to attending LCSW to reach out to pt's daughter to discuss option vs HHPT).   Patient sitting in recliner at end of session with brakes locked, belt alarm set, and all needs within reach.      Therapy Documentation Precautions:  Precautions Precautions: Fall Recall of Precautions/Restrictions: Impaired Precaution/Restrictions  Comments: fall risk and word salad on occassion Restrictions Weight Bearing Restrictions Per Provider Order: No  Therapy/Group: Individual Therapy  Assata Juncaj PTA 04/23/2024, 3:36 PM

## 2024-04-23 NOTE — Progress Notes (Signed)
 Occupational Therapy Discharge Summary  Patient Details  Name: Betty Golden MRN: [id number] Date of Birth: [birth year]  Date of Discharge from OT service:April 27, 2024   Patient has met 7 of 7 long term goals due to improved activity tolerance, improved balance, postural control, ability to compensate for deficits, improved awareness, and improved coordination.  Patient to discharge at overall Modified Independent level with basic self care skills and supervision for shower transfers.  Patient's care partner is independent to provide the necessary physical and cognitive assistance at discharge.  Pt's daughter attended one therapy session to observe her mother walking with RW.   Reasons goals not met: n/a  Recommendation:  Patient will benefit from ongoing skilled OT services in home health setting to continue to advance functional skills in the area of BADL and iADL.  Equipment: No equipment provided  Reasons for discharge: treatment goals met  Patient/family agrees with progress made and goals achieved: Yes  OT Discharge Precautions/Restrictions  Precautions Precautions: Fall Restrictions Weight Bearing Restrictions Per Provider Order: No  ADL ADL Equipment Provided:  (RW) Eating: Independent Where Assessed-Eating: Chair Grooming: Independent Where Assessed-Grooming: Standing at sink Upper Body Bathing: Modified independent Where Assessed-Upper Body Bathing: Shower Lower Body Bathing: Modified independent Where Assessed-Lower Body Bathing: Shower Upper Body Dressing: Independent Where Assessed-Upper Body Dressing: Edge of bed Lower Body Dressing: Modified independent Where Assessed-Lower Body Dressing: Chair Toileting: Modified independent Where Assessed-Toileting: Neurosurgeon Method: Proofreader: Other (comment), Grab bars Tub/Shower Transfer: Close supervison Web designer  Method: Ship broker: Information systems manager with back, Acupuncturist: Close supervision Film/video editor Method: Designer, industrial/product: Information systems manager with back Vision Baseline Vision/History: 0 No visual deficits Patient Visual Report: No change from baseline Perception  Perception: Within Functional Limits Praxis Praxis: WFL Cognition Cognition Overall Cognitive Status: Impaired/Different from baseline Arousal/Alertness: Awake/alert Memory: Impaired Memory Impairment: Decreased recall of new information Attention: Sustained Sustained Attention: Appears intact Awareness: Appears intact Problem Solving: Appears intact Executive Function: Sequencing Sequencing: Impaired Sequencing Impairment: Verbal basic Safety/Judgment: Appears intact Brief Interview for Mental Status (BIMS) Repetition of Three Words (First Attempt): 3 Temporal Orientation: Year: Correct Temporal Orientation: Month: Accurate within 5 days Temporal Orientation: Day: Correct Recall: Sock: Yes, no cue required Recall: Blue: Yes, no cue required Recall: Bed: Yes, no cue required BIMS Summary Score: 15 Sensation Sensation Light Touch: Appears Intact Proprioception: Appears Intact Coordination Gross Motor Movements are Fluid and Coordinated: No Fine Motor Movements are Fluid and Coordinated: Yes Coordination and Movement Description: pt is able to fasten bra and tie her shoes Motor  Motor Motor: Other (comment) Motor - Discharge Observations: generalized weakness from baseline fibromyalgia Mobility  Bed Mobility Bed Mobility: Sit to Supine;Supine to Sit Rolling Right: Independent Rolling Left: Independent Supine to Sit: Independent Sit to Supine: Independent Transfers Sit to Stand: Independent with assistive device Stand to Sit: Independent with assistive device  Trunk/Postural Assessment  Postural Control Postural Control: Within Functional  Limits  Balance Berg Balance Test Sit to Stand: Able to stand  independently using hands Standing Unsupported: Able to stand 30 seconds unsupported Sitting with Back Unsupported but Feet Supported on Floor or Stool: Able to sit safely and securely 2 minutes Stand to Sit: Controls descent by using hands Transfers: Able to transfer with verbal cueing and /or supervision Standing Unsupported with Eyes Closed: Able to stand 10 seconds with supervision Standing Ubsupported with Feet Together: Needs help to attain position  and unable to hold for 15 seconds From Standing, Reach Forward with Outstretched Arm: Reaches forward but needs supervision From Standing Position, Pick up Object from Floor: Unable to try/needs assist to keep balance From Standing Position, Turn to Look Behind Over each Shoulder: Turn sideways only but maintains balance Turn 360 Degrees: Needs assistance while turning Standing Unsupported, Alternately Place Feet on Step/Stool: Able to complete >2 steps/needs minimal assist Standing Unsupported, One Foot in Front: Loses balance while stepping or standing Standing on One Leg: Unable to try or needs assist to prevent fall Total Score: 21 Static Sitting Balance Static Sitting - Balance Support: Bilateral upper extremity supported Static Sitting - Level of Assistance: 7: Independent Static Standing Balance Static Standing - Balance Support: Bilateral upper extremity supported Static Standing - Level of Assistance: 6: Modified independent (Device/Increase time) Dynamic Standing Balance Dynamic Standing - Balance Support: Bilateral upper extremity supported;During functional activity Dynamic Standing - Level of Assistance: Mod ind using RW for support Extremity/Trunk Assessment RUE Assessment Active Range of Motion (AROM) Comments: WFL General Strength Comments: WFL LUE Assessment Active Range of Motion (AROM) Comments: WFL General Strength Comments:  WFL   Ally Knodel 04/27/2024, 12:44 PM

## 2024-04-23 NOTE — Progress Notes (Signed)
 Speech Language Pathology Daily Session Note  Patient Details  Name: Betty Golden MRN: [id number] Date of Birth: [birth year]  Today's Date: 04/23/2024 SLP Individual Time: 0800-0859 SLP Individual Time Calculation (min): 59 min  Short Term Goals: Week 1: SLP Short Term Goal 1 (Week 1): STG = LTG due to ELOS  Skilled Therapeutic Interventions: Skilled therapy session focused on communication goals. SLP facilitated session by prompting completion of automatic speech tasks. Patient able to verbalize first name, however unable to verbalize last. Patient required mod-maxA to name days of the week, months of the year and count 1-10. Patient with consistent perseverations and semantic paraphasias during these tasks. SLP continued to challenge patients communication through phrase completion task. Patient with 85% accuracy, though with intermittent apraxic errors and approximations. Lastly, patient challenged with responsive naming. Patient with 87% accuracy independently. Patient left in bed with alarm set and call bell in reach. Continue POC   Pain denies  Therapy/Group: Individual Therapy  Webster Patrone M.A., CCC-SLP 04/23/2024, 7:49 AM

## 2024-04-23 NOTE — Progress Notes (Signed)
 PROGRESS NOTE   Subjective/Complaints:  Spoke to daughter about elevated LFT (<2x nl), discussed [facility] Slept better pt hopes to participate better in PT today  No pan c/os  ROS: Denies CP, SOB, abd pain, N/V/D/C, or any other complaints at this time.    Objective:   No results found. Recent Labs    04/22/24 0549  WBC 10.2  HGB 11.9*  HCT 36.7  PLT 207   Recent Labs    04/22/24 0549 04/23/24 0530  NA 139  --   K 3.0* 3.9  CL 108  --   CO2 23  --   GLUCOSE 78  --   BUN 6*  --   CREATININE 1.11*  --   CALCIUM  9.3  --         Intake/Output Summary (Last 24 hours) at 04/23/2024 0715 Last data filed at 04/22/2024 2100 Gross per 24 hour  Intake 100 ml  Output --  Net 100 ml        Physical Exam: Vital Signs Blood pressure 133/73, pulse 97, temperature 98.7 F (37.1 C), temperature source Oral, resp. rate 16, height 5' 3 (1.6 m), weight 69.1 kg, SpO2 97%.   General: No acute distress Mood and affect are appropriate Heart: Regular rate and rhythm no rubs murmurs or extra sounds Lungs: Clear to auscultation, breathing unlabored, no rales or wheezes Abdomen: Positive bowel sounds, soft nontender to palpation, nondistended Extremities: No clubbing, cyanosis, or edema Skin: No evidence of breakdown, no evidence of rash Neurologic: Cranial nerves II through XII intact,Sensory exam difficult to assess due to aphasia  Cerebellar exam normal finger to nose to finger  Musculoskeletal: Full range of motion in all 4 extremities. No joint swelling    Neuro: Mental Status: limited by aphasia Speech/Language: Aphasia more expressive than receptive, cannot state day and date due to naming issues   Neuro:  Eyes without evidence of nystagmus  Tone is normal without evidence of spasticity  No evidence of trunkal ataxia  Motor strength is 5/5 in bilateral deltoid, biceps, triceps, finger flexors and extensors, wrist  flexors and extensors, hip flexors, knee flexors and extensors, ankle dorsiflexors, plantar flexors, invertors and evertors, toe flexors and extensors    REFLEXES: No ankle clonus   SENSORY: Normal to touch all 4 extremities   Coordination: Normal finger to nose bilaterally  Assessment/Plan: 1. Functional deficits which require 3+ hours per day of interdisciplinary therapy in a comprehensive inpatient rehab setting. Physiatrist is providing close team supervision and 24 hour management of active medical problems listed below. Physiatrist and rehab team continue to assess barriers to discharge/monitor patient progress toward functional and medical goals  Care Tool:  Bathing    Body parts bathed by patient: Right arm, Left arm, Chest, Abdomen, Front perineal area, Buttocks, Right upper leg, Left upper leg, Right lower leg, Left lower leg, Face         Bathing assist Assist Level: Contact Guard/Touching assist     Upper Body Dressing/Undressing Upper body dressing   What is the patient wearing?: Pull over shirt    Upper body assist Assist Level: Set up assist    Lower Body Dressing/Undressing Lower body  dressing      What is the patient wearing?: Underwear/pull up     Lower body assist Assist for lower body dressing: Contact Guard/Touching assist     Toileting Toileting    Toileting assist Assist for toileting: Supervision/Verbal cueing     Transfers Chair/bed transfer  Transfers assist     Chair/bed transfer assist level: Contact Guard/Touching assist     Locomotion Ambulation   Ambulation assist      Assist level: Contact Guard/Touching assist Assistive device: Walker-rolling Max distance: 150   Walk 10 feet activity   Assist     Assist level: Contact Guard/Touching assist Assistive device: Walker-rolling   Walk 50 feet activity   Assist    Assist level: Contact Guard/Touching assist Assistive device: Walker-rolling    Walk 150  feet activity   Assist    Assist level: Contact Guard/Touching assist Assistive device: Walker-rolling    Walk 10 feet on uneven surface  activity   Assist Walk 10 feet on uneven surfaces activity did not occur: Safety/medical concerns   Assist level: Contact Guard/Touching assist Assistive device: Walker-rolling   Wheelchair     Assist Is the patient using a wheelchair?: Yes (per PT Evaluation documentation) Type of Wheelchair: Manual    Wheelchair assist level: Supervision/Verbal cueing Max wheelchair distance: 150    Wheelchair 50 feet with 2 turns activity    Assist        Assist Level: Supervision/Verbal cueing   Wheelchair 150 feet activity     Assist      Assist Level: Supervision/Verbal cueing   Blood pressure 133/73, pulse 97, temperature 98.7 F (37.1 C), temperature source Oral, resp. rate 16, height 5' 3 (1.6 m), weight 69.1 kg, SpO2 97%.  Medical Problem List and Plan: 1. 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  Patient did not tolerate MRI             -patient may shower             -ELOS/Goals: 10-12 days, PT/OT/SLP supervision             -Admit to CIR 2.  Antithrombotics: -DVT/anticoagulation:  Pharmaceutical: Lovenox  40mg  daily -antiplatelet therapy: DAPT X 3 months followed by ASA alone.  Confirmed in Neuro note 9/18 3. Pain Management:  Tylenol  prn.  4. Mood/Behavior/Sleep: LCSW to follow for evaluation and support --trazodone  25 mg at bedtime. Ativan  prn for anxiety/agitation due to aphasia?   -04/18/24 still having trouble sleeping-- scheduled melatonin 5mg              -antipsychotic agents: has had haldol  prn--last used on 09/18. Not reordered -DC summary indicates cymbalta  60mg  daily, does not appear she got this at the acute hospital. Pt states she uses 30mg  BID-- reordered 5. Neuropsych/cognition: This patient may be capable of making decisions on her own behalf. 6.  Skin/Wound Care: Routine pressure relief measures.             -- Monitor skin integrity 7. Fluids/Electrolytes/Nutrition: Monitor I/O. Routine labs. Vitamins/supplements.     Latest Ref Rng & Units 04/23/2024    5:30 AM 04/22/2024    5:49 AM 04/19/2024    5:04 AM  BMP  Glucose 70 - 99 mg/dL  78  83   BUN 8 - 23 mg/dL  6  9   Creatinine 9.55 - 1.00 mg/dL  8.88  8.89   Sodium 864 - 145 mmol/L  139  140  Potassium 3.5 - 5.1 mmol/L 3.9  3.0  3.5   Chloride 98 - 111 mmol/L  108  110   CO2 22 - 32 mmol/L  23  20   Calcium  8.9 - 10.3 mg/dL  9.3  9.3     8. Diverticulitis/Colitis: Transitioned to cipro  500mg  bid and Flagyl  500 tid orally on 9/18, IV abx from 9/15-9/18 -WBC trending down today but also had drop in H/H. Monitor for signs of bleeding.               - Follow-up with gastroenterologist outpatient Cipro  and Metronidazole  until 9/27  9. HTN: Monitor BP TID--avoid hypoperfusion. BP meds have been resumed.  Monitor with activity. --On amlodipine  10mg  daily -04/18/24 BP variable, leave meds as is for now, monitor  Vitals:   04/20/24 1336 04/20/24 1900 04/21/24 0257 04/21/24 0745  BP: (!) 151/71 (!) 152/73 (!) 154/76 (!) 140/73   04/21/24 1207 04/21/24 1317 04/21/24 2002 04/22/24 0307  BP: 128/69 (P) 132/65 138/72 129/69   04/22/24 0542 04/22/24 1618 04/22/24 2034 04/23/24 0603  BP: (!) 141/65 115/65 (!) 150/85 133/73    10. COPD: On Breztri  bid with Fasenra  every 8 weeks. 11. RA: Stable on Leflunomide  20mg  daily.  12. Hypokalemia: Being supplemented daily but still low Recurrent low KCL supplementation 9/25 x2 , recheck 9/26- normal will check on Monday 9/29 13. Hypomagnesemia: 1.8 on recheck 9/21, monitor as needed 14.  ABLA: Monitor for rectal bleeding. Hgb down from 12.8 to 11.2. Back up to 13.0 on 9/20-- 12.8 9/21, monitor routinely     Latest Ref Rng & Units 04/22/2024    5:49 AM 04/20/2024    4:57 AM 04/19/2024    5:04 AM  CBC  WBC 4.0 - 10.5 K/uL 10.2   10.9    Hemoglobin 12.0 - 15.0 g/dL 88.0  89.0  88.9   Hematocrit 36.0 - 46.0 % 36.7  32.8  33.0   Platelets 150 - 400 K/uL 207   202    Check stool OB, repeat H and H stable /improving so may d/c order  15, Delirium: Resolving but needed ativan   16. Leukocytosis: resolved on abx for diverticulitis, UA neg as expected given dual abx 17. HLD: continue rosuvastatin  20mg  daily 18. GERD: protonix  40mg  daily.   19.  Poor appetite likely due to diverticulitis and abx  LOS: 6 days A FACE TO FACE EVALUATION WAS PERFORMED  Prentice FORBES Compton 04/23/2024, 7:15 AM

## 2024-04-24 NOTE — Progress Notes (Signed)
 Physical Therapy Weekly Progress Note  Patient Details  Name: Betty Golden MRN: [id number] Date of Birth: 12-Mar-1946  Beginning of progress report period: April 18, 2024 End of progress report period: April 24, 2024  No STG's set due to pt's short ELOS at evaluation. Pt currently has had a set back in functional mobility due to fibromyalgia flare up that caused pt to have increased weakness and decreased energy levels. She progressed to CGA with ambulation prior to exacerbation, and then required closer to [facility] when ambulating longer than 10' due to weakness and would require seated rest break. Pt overall CGA to descend stairs, but closer to modA ascending due to weakness. PTA communicated with rehab team on recommendation to extend pt's d/c date to further assess pt's progression of CLOF beyond fibromyalgia flare up. Pt is also in agreeance with not feeling ready to d/c 9/29 due to this.  Patient continues to demonstrate the following deficits {impairments:3041632} and therefore will continue to benefit from skilled PT intervention to increase functional independence with mobility.  Patient {LTG progression:3041653}.  {plan of rjmz:6958345}  PT Short Term Goals {DUH:6958314}  Skilled Therapeutic Interventions/Progress Updates:      Therapy Documentation Precautions:  Precautions Precautions: Fall Recall of Precautions/Restrictions: Impaired Precaution/Restrictions Comments: fall risk and word salad on occassion Restrictions Weight Bearing Restrictions Per Provider Order: No  Sem Mccaughey PTA   04/24/2024, 4:07 PM

## 2024-04-24 NOTE — Progress Notes (Signed)
 PROGRESS NOTE   Subjective/Complaints:  Pt doing fine, slept well, denies pain, LBM yesterday per pt but not documented since 9/25. Urinating fine. No other complaints or concerns.   ROS: Denies CP, SOB, abd pain, N/V/D/C, or any other complaints at this time.    Objective:   No results found. Recent Labs    04/22/24 0549  WBC 10.2  HGB 11.9*  HCT 36.7  PLT 207   Recent Labs    04/22/24 0549 04/23/24 0530  NA 139  --   K 3.0* 3.9  CL 108  --   CO2 23  --   GLUCOSE 78  --   BUN 6*  --   CREATININE 1.11*  --   CALCIUM  9.3  --         Intake/Output Summary (Last 24 hours) at 04/24/2024 1015 Last data filed at 04/24/2024 9271 Gross per 24 hour  Intake 200 ml  Output --  Net 200 ml        Physical Exam: Vital Signs Blood pressure (!) 151/72, pulse 97, temperature 97.7 F (36.5 C), resp. rate 19, height 5' 3 (1.6 m), weight 69.1 kg, SpO2 96%.   General: No acute distress, resting comfortably in bed.  Mood and affect are appropriate Heart: Regular rate and rhythm no rubs murmurs or extra sounds Lungs: Clear to auscultation, breathing unlabored, no rales or wheezes Abdomen: Positive bowel sounds, soft nontender to palpation, nondistended Extremities: No clubbing, cyanosis, or edema Skin: No evidence of breakdown, no evidence of rash to exposed surfaces Neuro: mild aphasia noted  PRIOR EXAMS: Neurologic: Cranial nerves II through XII intact,Sensory exam difficult to assess due to aphasia  Cerebellar exam normal finger to nose to finger  Musculoskeletal: Full range of motion in all 4 extremities. No joint swelling    Neuro: Mental Status: limited by aphasia Speech/Language: Aphasia more expressive than receptive, cannot state day and date due to naming issues   Neuro:  Eyes without evidence of nystagmus  Tone is normal without evidence of spasticity  No evidence of trunkal ataxia  Motor  strength is 5/5 in bilateral deltoid, biceps, triceps, finger flexors and extensors, wrist flexors and extensors, hip flexors, knee flexors and extensors, ankle dorsiflexors, plantar flexors, invertors and evertors, toe flexors and extensors    REFLEXES: No ankle clonus   SENSORY: Normal to touch all 4 extremities   Coordination: Normal finger to nose bilaterally  Assessment/Plan: 1. Functional deficits which require 3+ hours per day of interdisciplinary therapy in a comprehensive inpatient rehab setting. Physiatrist is providing close team supervision and 24 hour management of active medical problems listed below. Physiatrist and rehab team continue to assess barriers to discharge/monitor patient progress toward functional and medical goals  Care Tool:  Bathing    Body parts bathed by patient: Right arm, Left arm, Chest, Abdomen, Front perineal area, Buttocks, Right upper leg, Left upper leg, Right lower leg, Left lower leg, Face         Bathing assist Assist Level: Supervision/Verbal cueing     Upper Body Dressing/Undressing Upper body dressing   What is the patient wearing?: Pull over shirt, Bra    Upper body assist  Assist Level: Independent with assistive device    Lower Body Dressing/Undressing Lower body dressing      What is the patient wearing?: Underwear/pull up, Pants     Lower body assist Assist for lower body dressing: Supervision/Verbal cueing     Toileting Toileting    Toileting assist Assist for toileting: Supervision/Verbal cueing     Transfers Chair/bed transfer  Transfers assist     Chair/bed transfer assist level: Supervision/Verbal cueing     Locomotion Ambulation   Ambulation assist      Assist level: Contact Guard/Touching assist Assistive device: Walker-rolling Max distance: 150   Walk 10 feet activity   Assist     Assist level: Contact Guard/Touching assist Assistive device: Walker-rolling   Walk 50 feet  activity   Assist    Assist level: Contact Guard/Touching assist Assistive device: Walker-rolling    Walk 150 feet activity   Assist    Assist level: Contact Guard/Touching assist Assistive device: Walker-rolling    Walk 10 feet on uneven surface  activity   Assist Walk 10 feet on uneven surfaces activity did not occur: Safety/medical concerns   Assist level: Contact Guard/Touching assist Assistive device: Walker-rolling   Wheelchair     Assist Is the patient using a wheelchair?: Yes (per PT Evaluation documentation) Type of Wheelchair: Manual    Wheelchair assist level: Supervision/Verbal cueing Max wheelchair distance: 150    Wheelchair 50 feet with 2 turns activity    Assist        Assist Level: Supervision/Verbal cueing   Wheelchair 150 feet activity     Assist      Assist Level: Supervision/Verbal cueing   Blood pressure (!) 151/72, pulse 97, temperature 97.7 F (36.5 C), resp. rate 19, height 5' 3 (1.6 m), weight 69.1 kg, SpO2 96%.  Medical Problem List and Plan: 1. 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  Patient did not tolerate MRI             -patient may shower             -ELOS/Goals: 10-12 days, PT/OT/SLP supervision             -Admit to CIR 2.  Antithrombotics: -DVT/anticoagulation:  Pharmaceutical: Lovenox  40mg  daily -antiplatelet therapy: DAPT X 3 months followed by ASA alone.  Confirmed in Neuro note 9/18 3. Pain Management:  Tylenol  prn.  4. Mood/Behavior/Sleep: LCSW to follow for evaluation and support --trazodone  25 mg at bedtime. Ativan  prn for anxiety/agitation due to aphasia?   -04/18/24 still having trouble sleeping-- scheduled melatonin 5mg              -antipsychotic agents: has had haldol  prn--last used on 09/18. Not reordered -DC summary indicates cymbalta  60mg  daily, does not appear she got this at the acute hospital. Pt states she uses 30mg  BID--  reordered 5. Neuropsych/cognition: This patient may be capable of making decisions on her own behalf. 6. Skin/Wound Care: Routine pressure relief measures.             -- Monitor skin integrity 7. Fluids/Electrolytes/Nutrition: Monitor I/O. Routine labs. Vitamins/supplements.     Latest Ref Rng & Units 04/23/2024    5:30 AM 04/22/2024    5:49 AM 04/19/2024    5:04 AM  BMP  Glucose 70 - 99 mg/dL  78  83   BUN 8 - 23 mg/dL  6  9   Creatinine 9.55 - 1.00 mg/dL  8.88  8.89  Sodium 135 - 145 mmol/L  139  140   Potassium 3.5 - 5.1 mmol/L 3.9  3.0  3.5   Chloride 98 - 111 mmol/L  108  110   CO2 22 - 32 mmol/L  23  20   Calcium  8.9 - 10.3 mg/dL  9.3  9.3     8. Diverticulitis/Colitis: Transitioned to cipro  500mg  bid and Flagyl  500 tid orally on 9/18, IV abx from 9/15-9/18 -WBC trending down today but also had drop in H/H. Monitor for signs of bleeding.               - Follow-up with gastroenterologist outpatient Cipro  and Metronidazole  until 9/27  9. HTN: Monitor BP TID--avoid hypoperfusion. BP meds have been resumed.  Monitor with activity. --On amlodipine  10mg  daily -04/24/24 BP mostly fine, a little up since yesterday but to avoid hypoperfusion, will leave meds as is for now.   Vitals:   04/21/24 1207 04/21/24 1317 04/21/24 2002 04/22/24 0307  BP: 128/69 (P) 132/65 138/72 129/69   04/22/24 0542 04/22/24 1618 04/22/24 2034 04/23/24 0603  BP: (!) 141/65 115/65 (!) 150/85 133/73   04/23/24 1416 04/23/24 2031 04/23/24 2100 04/24/24 0513  BP: (!) 149/72 (!) 140/67 (!) 140/67 (!) 151/72    10. COPD: On Breztri  bid with Fasenra  every 8 weeks. 11. RA: Stable on Leflunomide  20mg  daily.  12. Hypokalemia: Being supplemented daily but still low Recurrent low KCL supplementation 9/25 x2 , recheck 9/26- normal will check on Monday 9/29 13. Hypomagnesemia: 1.8 on recheck 9/21, monitor as needed 14.  ABLA: Monitor for rectal bleeding. Hgb down from 12.8 to 11.2. Back up to 13.0 on 9/20-- 12.8  9/21, monitor routinely  Check stool OB, repeat H and H stable /improving so may d/c order      Latest Ref Rng & Units 04/22/2024    5:49 AM 04/20/2024    4:57 AM 04/19/2024    5:04 AM  CBC  WBC 4.0 - 10.5 K/uL 10.2   10.9   Hemoglobin 12.0 - 15.0 g/dL 88.0  89.0  88.9   Hematocrit 36.0 - 46.0 % 36.7  32.8  33.0   Platelets 150 - 400 K/uL 207   202    15, Delirium: Resolving but needed ativan   16. Leukocytosis: resolved on abx for diverticulitis, UA neg as expected given dual abx 17. HLD: continue rosuvastatin  20mg  daily 18. GERD: protonix  40mg  daily.   19.  Poor appetite likely due to diverticulitis and abx  LOS: 7 days A FACE TO FACE EVALUATION WAS PERFORMED  [address] 04/24/2024, 10:15 AM

## 2024-04-24 NOTE — Progress Notes (Signed)
 Physical Therapy Session Note  Patient Details  Name: Betty Golden MRN: [id number] Date of Birth: [birth year]  Today's Date: 04/24/2024 PT Individual Time: 1120-1200 PT Individual Time Calculation (min): 40 min   Short Term Goals: Week 1:  PT Short Term Goal 1 (Week 1): STGs = LTGs  Skilled Therapeutic Interventions/Progress Updates: Patient sitting in recliner on entrance to room. Patient alert and agreeable to PT session.   Patient reported no pain during session. Pt stated feeling a little better from yesterday since her fibromyalgia became exacerbated, but still feeling weak. Pt attempted to ambulated less than 30' from room in RW with CGA for safety and then needed to sit due to weakness in B LE's (pt cued initially to engage R glute to improve hip extension, and to decrease L knee flexion). PTA discussed with pt about potentially extending d/c date due to this set back as pt had missed almost a full day of therapy on Thursday (9/25) and decreased ability to perform functional standing interventions on Friday (9/26) due to fibromyalgia exacerbation. Pt agreed and stated concern to d/c 9/29 due to this current set back. PTA reached out to covering physician with further communication to rest of rehab team with recommendation to extend pt's d/c date.   Therapeutic Activity: Transfers: Pt performed sit<>stand transfers throughout session with supervision/CGA for safety. Provided VC for R glute activation to increase upright standing posture. Pt presented with B knees in slight flexion and increased WB on RW due to weakness from fibromyalgia.  - Pt performed WC<>car transfer in RW with supervision - Pt navigated 4 (6) steps with heavy minA to ascend to transitioned to modA on last 2 steps due to B LE weakness and use of LHR and cues to lead with L LE. Pt then descended with B UE support on RHR with cues to laterally step with L LE leading and overall CGA for safety. Pt required seated rest  break due to fatigue.   Patient sitting in recliner at end of session with brakes locked, belt alarm set, and all needs within reach.      Therapy Documentation Precautions:  Precautions Precautions: Fall Recall of Precautions/Restrictions: Impaired Precaution/Restrictions Comments: fall risk and word salad on occassion Restrictions Weight Bearing Restrictions Per Provider Order: No  Therapy/Group: Individual Therapy  Binnie Vonderhaar PTA 04/24/2024, 12:05 PM

## 2024-04-25 DIAGNOSIS — R5383 Other fatigue: Secondary | ICD-10-CM

## 2024-04-25 LAB — CBC WITH DIFFERENTIAL/PLATELET
Abs Immature Granulocytes: 0.06 K/uL (ref 0.00–0.07)
Basophils Absolute: 0 K/uL (ref 0.0–0.1)
Basophils Relative: 0 %
Eosinophils Absolute: 0 K/uL (ref 0.0–0.5)
Eosinophils Relative: 0 %
HCT: 40 % (ref 36.0–46.0)
Hemoglobin: 13.2 g/dL (ref 12.0–15.0)
Immature Granulocytes: 1 %
Lymphocytes Relative: 36 %
Lymphs Abs: 4 K/uL (ref 0.7–4.0)
MCH: 28.8 pg (ref 26.0–34.0)
MCHC: 33 g/dL (ref 30.0–36.0)
MCV: 87.3 fL (ref 80.0–100.0)
Monocytes Absolute: 1.2 K/uL — ABNORMAL HIGH (ref 0.1–1.0)
Monocytes Relative: 11 %
Neutro Abs: 5.9 K/uL (ref 1.7–7.7)
Neutrophils Relative %: 52 %
Platelets: 209 K/uL (ref 150–400)
RBC: 4.58 MIL/uL (ref 3.87–5.11)
RDW: 13.7 % (ref 11.5–15.5)
WBC: 11.2 K/uL — ABNORMAL HIGH (ref 4.0–10.5)
nRBC: 0 % (ref 0.0–0.2)

## 2024-04-25 LAB — URINALYSIS, ROUTINE W REFLEX MICROSCOPIC
Bilirubin Urine: NEGATIVE
Glucose, UA: NEGATIVE mg/dL
Hgb urine dipstick: NEGATIVE
Ketones, ur: 20 mg/dL — AB
Leukocytes,Ua: NEGATIVE
Nitrite: NEGATIVE
Protein, ur: NEGATIVE mg/dL
Specific Gravity, Urine: 1.012 (ref 1.005–1.030)
pH: 5 (ref 5.0–8.0)

## 2024-04-25 LAB — COMPREHENSIVE METABOLIC PANEL WITH GFR
ALT: 69 U/L — ABNORMAL HIGH (ref 0–44)
AST: 76 U/L — ABNORMAL HIGH (ref 15–41)
Albumin: 3.9 g/dL (ref 3.5–5.0)
Alkaline Phosphatase: 42 U/L (ref 38–126)
Anion gap: 19 — ABNORMAL HIGH (ref 5–15)
BUN: 9 mg/dL (ref 8–23)
CO2: 18 mmol/L — ABNORMAL LOW (ref 22–32)
Calcium: 10.2 mg/dL (ref 8.9–10.3)
Chloride: 103 mmol/L (ref 98–111)
Creatinine, Ser: 1.09 mg/dL — ABNORMAL HIGH (ref 0.44–1.00)
GFR, Estimated: 53 mL/min — ABNORMAL LOW (ref >60)
Glucose, Bld: 81 mg/dL (ref 70–99)
Potassium: 4.3 mmol/L (ref 3.5–5.1)
Sodium: 140 mmol/L (ref 135–145)
Total Bilirubin: 1 mg/dL (ref 0.0–1.2)
Total Protein: 7.4 g/dL (ref 6.5–8.1)

## 2024-04-25 MED ORDER — SODIUM CHLORIDE 0.9 % IV SOLN: INTRAVENOUS | Status: DC

## 2024-04-25 NOTE — Progress Notes (Signed)
 Occupational Therapy Session Note  Patient Details  Name: Betty Golden MRN: [id number] Date of Birth: Oct 21, 1945  Today's Date: 04/25/2024 OT Individual Time: 9152-9050 OT Individual Time Calculation (min): 62 min  13 mins missed d/t fatigue  Short Term Goals: Week 1:  OT Short Term Goal 1 (Week 1): The pt will safely  complete UB/LB  bathing / dressing with ModI incorporating AE as needed. OT Short Term Goal 2 (Week 1): The pt will safely transfer to all surfaces with ModI incorporating AE as needed. OT Short Term Goal 3 (Week 1): The pt will demonstrate good safety awareness while completing a simple home making task at Mod I using AE as needed. OT Short Term Goal 4 (Week 1): The pt will complete a cognitive activity with accuracy 3 out of  5 attempt with minimal cues following  demonstration and verbal instruction.  Skilled Therapeutic Interventions/Progress Updates:  Pt greeted asleep in supine, pt agreeable to OT intervention.      Transfers/bed mobility/functional mobility:  Pt complete bed mobility with supervision. Attempted functional ambulation with no AD however pt noted to limp seeming to favor her L hip and noted to furniture walk to gather her clothes for bathing. Pt needed to sit after walking ~ 3 ft d/t fatigue. Suggested using RW for remainder of mobility d/t antalgic gait. Pt completed functional ambulation in room with RW with close supervision.    ADLs:  Grooming: pt donned deo with set- up assist.  UB dressing:pt donned bra and OH shirt MODI, pt able to retrieve clothes from bag this date.  LB dressing: pt donned pants/underwear from EOB MODI.  Footwear: donned socks with total A d/t fatigue  Bathing: pt completed bathing with supervision but pt needed to stop shower prematurely d/t fatigue Transfers: ambulatory toilet/shower transfer with RW and supervision, MIN cues needed to sequence shower transfer with RW.  Toileting: pt with continent urine void needing  supervision  for 3/3 toileting tasks.    Exercises:  Pt declined leaving room but agreeable to BUE HEP in room. Pt completed below HEP with level 1 theraband, emphasis on having pt direct therex:  Access Code: [email or web address] URL: [email or web address] Date: 04/25/2024 Prepared by:   Exercises - Seated Shoulder Horizontal Abduction with Resistance  - 1 x daily - 7 x weekly - 3 sets - 10 reps - Seated Shoulder Flexion with Self-Anchored Resistance  - 1 x daily - 7 x weekly - 3 sets - 10 reps - Seated Shoulder Diagonal Pulls with Resistance  - 1 x daily - 7 x weekly - 3 sets - 10 reps - Seated Shoulder Extension with Self-Anchored Resistance  - 1 x daily - 7 x weekly - 3 sets - 10 reps - Seated Elbow Flexion with Self-Anchored Resistance  - 1 x daily - 7 x weekly - 3 sets - 10 reps                 Ended session with pt seated in recliner with all needs within reach.    Therapy Documentation Precautions:  Precautions Precautions: Fall Recall of Precautions/Restrictions: Impaired Precaution/Restrictions Comments: fall risk and word salad on occassion Restrictions Weight Bearing Restrictions Per Provider Order: No  Pain:unrated pain reported in neck, rest breaks provided as needed.   Therapy/Group: Individual Therapy  [name] 04/25/2024, 12:10 PM

## 2024-04-25 NOTE — Progress Notes (Signed)
 Speech Language Pathology Daily Session Note  Patient Details  Name: Betty Golden MRN: [id number] Date of Birth: 05/27/1946  Today's Date: 04/25/2024 SLP Individual Time: 1028-1108 SLP Individual Time Calculation (min): 40 min  Short Term Goals: Week 1: SLP Short Term Goal 1 (Week 1): STG = LTG due to ELOS  Skilled Therapeutic Interventions:   Pt seen for skilled SLP session to address speech/language goals. SLP targeted speech production accuracy with naming exercise using a [employer]. Pt challenged with accurate verbal production of the animal giraffe despite max verbal and visual cues (written word, repetition with attention to SLP mouth movements, melodic intonation). Verbal cues effective for improving speech accuracy with other animals pictured. Pt left sitting upright in chair with needs in reach and chair alarm activated. Recommend continue SLP PoC to address speech goals.   Pain Pain Assessment Pain Scale: 0-10 Pain Score: 0-No pain  Therapy/Group: Individual Therapy  [name] 04/25/2024, 11:10 AM

## 2024-04-25 NOTE — Progress Notes (Signed)
 PROGRESS NOTE   Subjective/Complaints:  Pt not feeling great today, slow to get up, and has had lack of appetite-- can't really tell me much more, just that her leg is hurting and she's tired-- told Dom yesterday the same thing, and he questioned whether she'd be ready to go home tomorrow. Pt can't localize any other symptoms, just not feeling totally well. Mentions that she has fibromyalgia and RA which she thinks is contributory.  Nursing staff mentioning she has not been eating much at all the last couple days. Pt denies nausea, just doesn't have much appetite.  Slept ok, and says pain is manageable for the most part with meds. LBM yesterday. Urinating fine, no pain or discomfort/difficulty there. No other complaints or concerns.   ROS: see HPI. Denies CP, SOB, abd pain, N/V/D/C, urinary symptoms, or any other complaints at this time.    Objective:   No results found. No results for input(s): WBC, HGB, HCT, PLT in the last 72 hours.  Recent Labs    04/23/24 0530  K 3.9        Intake/Output Summary (Last 24 hours) at 04/25/2024 1112 Last data filed at 04/24/2024 1829 Gross per 24 hour  Intake 357 ml  Output --  Net 357 ml        Physical Exam: Vital Signs Blood pressure 124/69, pulse (!) 110, temperature 98.1 F (36.7 C), temperature source Oral, resp. rate 19, height 5' 3 (1.6 m), weight 69.1 kg, SpO2 97%.   General: No acute distress, resting comfortably in bed but saying not feeling great HEENT: lips a bit dry Mood and affect are appropriate Heart: Regular rate and rhythm no rubs murmurs or extra sounds Lungs: Clear to auscultation, breathing unlabored, no rales or wheezes Abdomen: Positive bowel sounds, soft nontender to palpation, nondistended Extremities: No clubbing, cyanosis, or edema Skin: No evidence of breakdown, no evidence of rash to exposed surfaces Neuro: mild aphasia noted  PRIOR  EXAMS: Neurologic: Cranial nerves II through XII intact,Sensory exam difficult to assess due to aphasia  Cerebellar exam normal finger to nose to finger  Musculoskeletal: Full range of motion in all 4 extremities. No joint swelling    Neuro: Mental Status: limited by aphasia Speech/Language: Aphasia more expressive than receptive, cannot state day and date due to naming issues   Neuro:  Eyes without evidence of nystagmus  Tone is normal without evidence of spasticity  No evidence of trunkal ataxia  Motor strength is 5/5 in bilateral deltoid, biceps, triceps, finger flexors and extensors, wrist flexors and extensors, hip flexors, knee flexors and extensors, ankle dorsiflexors, plantar flexors, invertors and evertors, toe flexors and extensors    REFLEXES: No ankle clonus   SENSORY: Normal to touch all 4 extremities   Coordination: Normal finger to nose bilaterally  Assessment/Plan: 1. Functional deficits which require 3+ hours per day of interdisciplinary therapy in a comprehensive inpatient rehab setting. Physiatrist is providing close team supervision and 24 hour management of active medical problems listed below. Physiatrist and rehab team continue to assess barriers to discharge/monitor patient progress toward functional and medical goals  Care Tool:  Bathing    Body parts bathed by patient: Right  arm, Left arm, Chest, Abdomen, Front perineal area, Buttocks, Right upper leg, Left upper leg, Right lower leg, Left lower leg, Face         Bathing assist Assist Level: Supervision/Verbal cueing     Upper Body Dressing/Undressing Upper body dressing   What is the patient wearing?: Pull over shirt, Bra    Upper body assist Assist Level: Independent with assistive device    Lower Body Dressing/Undressing Lower body dressing      What is the patient wearing?: Underwear/pull up, Pants     Lower body assist Assist for lower body dressing: Independent with assitive  device     Toileting Toileting    Toileting assist Assist for toileting: Supervision/Verbal cueing     Transfers Chair/bed transfer  Transfers assist     Chair/bed transfer assist level: Independent with assistive device     Locomotion Ambulation   Ambulation assist      Assist level: Minimal Assistance - Patient > 75% Assistive device: Walker-rolling Max distance: 30'   Walk 10 feet activity   Assist     Assist level: Contact Guard/Touching assist Assistive device: Walker-rolling   Walk 50 feet activity   Assist Walk 50 feet with 2 turns activity did not occur: Safety/medical concerns  Assist level:  (unable to perform due to fibromyaliga exacerbation) Assistive device: Walker-rolling    Walk 150 feet activity   Assist Walk 150 feet activity did not occur: Safety/medical concerns  Assist level:  (unable to perform due to fibromyaliga exacerbation) Assistive device: Walker-rolling    Walk 10 feet on uneven surface  activity   Assist Walk 10 feet on uneven surfaces activity did not occur: Safety/medical concerns   Assist level:  (unable to perform due to fibromyaliga exacerbation) Assistive device: Walker-rolling   Wheelchair     Assist Is the patient using a wheelchair?: Yes (per PT Evaluation documentation) Type of Wheelchair: Manual    Wheelchair assist level: Supervision/Verbal cueing Max wheelchair distance: 150    Wheelchair 50 feet with 2 turns activity    Assist        Assist Level: Supervision/Verbal cueing   Wheelchair 150 feet activity     Assist      Assist Level: Supervision/Verbal cueing   Blood pressure 124/69, pulse (!) 110, temperature 98.1 F (36.7 C), temperature source Oral, resp. rate 19, height 5' 3 (1.6 m), weight 69.1 kg, SpO2 97%.  Medical Problem List and Plan: 1. 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  Patient did not tolerate  MRI             -patient may shower             -ELOS/Goals: 10-12 days, PT/OT/SLP supervision             -Admit to CIR -04/25/24 PT questioning if she'll be ready for d/c tomorrow given fatigue this weekend-- weekday team to address. I agree, it may need to be postponed.  2.  Antithrombotics: -DVT/anticoagulation:  Pharmaceutical: Lovenox  40mg  daily -antiplatelet therapy: DAPT X 3 months followed by ASA alone.  Confirmed in Neuro note 9/18 3. Pain Management:  Tylenol  prn.  4. Mood/Behavior/Sleep: LCSW to follow for evaluation and support --trazodone  25 mg at bedtime. Ativan  prn for anxiety/agitation due to aphasia?   -04/18/24 still having trouble sleeping-- scheduled melatonin 5mg              -antipsychotic agents: has had haldol  prn--last used on  09/18. Not reordered -DC summary indicates cymbalta  60mg  daily, does not appear she got this at the acute hospital. Pt states she uses 30mg  BID-- reordered 5. Neuropsych/cognition: This patient may be capable of making decisions on her own behalf. 6. Skin/Wound Care: Routine pressure relief measures.             -- Monitor skin integrity 7. Fluids/Electrolytes/Nutrition: Monitor I/O. Routine labs. Vitamins/supplements. -04/25/24 pt not feeling great, fatigued, hasn't been eating well-- will get CBC/CMP which are pending, will start IVFs at 50/hr x12hrs. Weekday team to f/up on labs in AM    Latest Ref Rng & Units 04/23/2024    5:30 AM 04/22/2024    5:49 AM 04/19/2024    5:04 AM  BMP  Glucose 70 - 99 mg/dL  78  83   BUN 8 - 23 mg/dL  6  9   Creatinine 9.55 - 1.00 mg/dL  8.88  8.89   Sodium 864 - 145 mmol/L  139  140   Potassium 3.5 - 5.1 mmol/L 3.9  3.0  3.5   Chloride 98 - 111 mmol/L  108  110   CO2 22 - 32 mmol/L  23  20   Calcium  8.9 - 10.3 mg/dL  9.3  9.3     8. Diverticulitis/Colitis: Transitioned to cipro  500mg  bid and Flagyl  500 tid orally on 9/18, IV abx from 9/15-9/18 -WBC trending down today but also had drop in H/H. Monitor for  signs of bleeding.               - Follow-up with gastroenterologist outpatient Cipro  and Metronidazole  until 9/27  9. HTN: Monitor BP TID--avoid hypoperfusion. BP meds have been resumed.  Monitor with activity. --On amlodipine  10mg  daily -04/24/24 BP mostly fine, a little up since yesterday but to avoid hypoperfusion, will leave meds as is for now.  -04/25/24 BP better this morning, monitor  Vitals:   04/22/24 0307 04/22/24 0542 04/22/24 1618 04/22/24 2034  BP: 129/69 (!) 141/65 115/65 (!) 150/85   04/23/24 0603 04/23/24 1416 04/23/24 2031 04/23/24 2100  BP: 133/73 (!) 149/72 (!) 140/67 (!) 140/67   04/24/24 0513 04/24/24 1325 04/24/24 2022 04/25/24 0443  BP: (!) 151/72 (!) 144/63 (!) 156/88 124/69    10. COPD: On Breztri  bid with Fasenra  every 8 weeks. 11. RA: Stable on Leflunomide  20mg  daily.  12. Hypokalemia: Being supplemented daily but still low Recurrent low KCL supplementation 9/25 x2 , recheck 9/26- normal will check on Monday 9/29 13. Hypomagnesemia: 1.8 on recheck 9/21, monitor as needed 14.  ABLA: Monitor for rectal bleeding. Hgb down from 12.8 to 11.2. Back up to 13.0 on 9/20-- 12.8 9/21, monitor routinely  Check stool OB, repeat H and H stable /improving so may d/c order      Latest Ref Rng & Units 04/22/2024    5:49 AM 04/20/2024    4:57 AM 04/19/2024    5:04 AM  CBC  WBC 4.0 - 10.5 K/uL 10.2   10.9   Hemoglobin 12.0 - 15.0 g/dL 88.0  89.0  88.9   Hematocrit 36.0 - 46.0 % 36.7  32.8  33.0   Platelets 150 - 400 K/uL 207   202    15, Delirium: Resolving but needed ativan   16. Leukocytosis: resolved on abx for diverticulitis, UA neg as expected given dual abx 17. HLD: continue rosuvastatin  20mg  daily 18. GERD: protonix  40mg  daily.   19.  Poor appetite likely due to diverticulitis and abx -04/25/24 pt not eating  much this weekend, noted to be more fatigued by PT yesterday, not feeling great today   -U/A done and noninfectious   -CBC and CMP pending   -IVFs started  as above (50/hr x12hrs)   -Monitor, but d/c may need to be delayed.    I spent >39mins performing patient care related activities, including prolonged face to face time, documentation time, lab review/ordering, med/fluids management, discussion of fatigue/discharge concerns with patient, therapy team, and nursing staff, and overall coordination of care.   LOS: 8 days A FACE TO FACE EVALUATION WAS PERFORMED  [address] 04/25/2024, 11:12 AM

## 2024-04-26 LAB — COMPREHENSIVE METABOLIC PANEL WITH GFR
ALT: 55 U/L — ABNORMAL HIGH (ref 0–44)
AST: 49 U/L — ABNORMAL HIGH (ref 15–41)
Albumin: 3.5 g/dL (ref 3.5–5.0)
Alkaline Phosphatase: 38 U/L (ref 38–126)
Anion gap: 12 (ref 5–15)
BUN: 10 mg/dL (ref 8–23)
CO2: 18 mmol/L — ABNORMAL LOW (ref 22–32)
Calcium: 9.7 mg/dL (ref 8.9–10.3)
Chloride: 109 mmol/L (ref 98–111)
Creatinine, Ser: 1.07 mg/dL — ABNORMAL HIGH (ref 0.44–1.00)
GFR, Estimated: 54 mL/min — ABNORMAL LOW (ref >60)
Glucose, Bld: 64 mg/dL — ABNORMAL LOW (ref 70–99)
Potassium: 4.3 mmol/L (ref 3.5–5.1)
Sodium: 139 mmol/L (ref 135–145)
Total Bilirubin: 1 mg/dL (ref 0.0–1.2)
Total Protein: 6.7 g/dL (ref 6.5–8.1)

## 2024-04-26 LAB — CBC
HCT: 39 % (ref 36.0–46.0)
Hemoglobin: 12.8 g/dL (ref 12.0–15.0)
MCH: 28.7 pg (ref 26.0–34.0)
MCHC: 32.8 g/dL (ref 30.0–36.0)
MCV: 87.4 fL (ref 80.0–100.0)
Platelets: 200 K/uL (ref 150–400)
RBC: 4.46 MIL/uL (ref 3.87–5.11)
RDW: 13.6 % (ref 11.5–15.5)
WBC: 10.9 K/uL — ABNORMAL HIGH (ref 4.0–10.5)
nRBC: 0 % (ref 0.0–0.2)

## 2024-04-26 MED ORDER — AMLODIPINE BESYLATE 5 MG PO TABS
5.0000 mg | ORAL_TABLET | Freq: Every day | ORAL | Status: DC
  Administered 2024-04-26 – 2024-04-27 (×2): 5 mg via ORAL
  Filled 2024-04-26 (×2): qty 1

## 2024-04-26 MED ORDER — METOPROLOL SUCCINATE ER 25 MG PO TB24
25.0000 mg | ORAL_TABLET | Freq: Every day | ORAL | Status: DC
  Administered 2024-04-26 – 2024-04-28 (×3): 25 mg via ORAL
  Filled 2024-04-26 (×3): qty 1

## 2024-04-26 NOTE — Progress Notes (Signed)
 PROGRESS NOTE   Subjective/Complaints:  Feels ok but has poor appetite.  Expressive aphasia limits Hx and ROS  ROS: aphasic  Objective:   No results found. Recent Labs    04/25/24 1634 04/26/24 0609  WBC 11.2* 10.9*  HGB 13.2 12.8  HCT 40.0 39.0  PLT 209 200    Recent Labs    04/25/24 1008 04/26/24 0609  NA 140 139  K 4.3 4.3  CL 103 109  CO2 18* 18*  GLUCOSE 81 64*  BUN 9 10  CREATININE 1.09* 1.07*  CALCIUM  10.2 9.7       No intake or output data in the 24 hours ending 04/26/24 0755       Physical Exam: Vital Signs Blood pressure (!) 150/88, pulse (!) 112, temperature 98.4 F (36.9 C), resp. rate 18, height 5' 3 (1.6 m), weight 69.1 kg, SpO2 100%.   General: No acute distress, resting comfortably in bed smiling   Mood and affect are appropriate Heart: Regular rate and rhythm no rubs murmurs or extra sounds Lungs: Clear to auscultation, breathing unlabored, no rales or wheezes Abdomen: Positive bowel sounds, soft nontender to palpation, nondistended Extremities: No clubbing, cyanosis, or edema Skin: No evidence of breakdown, no evidence of rash to exposed surfaces Neuro: mild aphasia noted   Neurologic: Cranial nerves II through XII intact,Sensory exam difficult to assess due to aphasia   Musculoskeletal: no hand or wrist deformities. No joint swelling    Neuro: Mental Status: limited by aphasia Speech/Language: Aphasia more expressive than receptive, cannot state day and date due to naming issues   Neuro:  Eyes without evidence of nystagmus  Tone is normal without evidence of spasticity  No evidence of trunkal ataxia  Motor strength is 5/5 in bilateral deltoid, biceps, triceps, finger flexors and extensors, wrist flexors and extensors, hip flexors, knee flexors and extensors, ankle dorsiflexors, plantar flexors, invertors and evertors, toe flexors and extensors    REFLEXES: No ankle  clonus   SENSORY: Normal to touch all 4 extremities   Coordination: Normal finger to nose bilaterally  Assessment/Plan: 1. Functional deficits which require 3+ hours per day of interdisciplinary therapy in a comprehensive inpatient rehab setting. Physiatrist is providing close team supervision and 24 hour management of active medical problems listed below. Physiatrist and rehab team continue to assess barriers to discharge/monitor patient progress toward functional and medical goals  Care Tool:  Bathing    Body parts bathed by patient: Right arm, Left arm, Chest, Abdomen, Front perineal area, Buttocks, Right upper leg, Left upper leg, Right lower leg, Left lower leg, Face         Bathing assist Assist Level: Supervision/Verbal cueing     Upper Body Dressing/Undressing Upper body dressing   What is the patient wearing?: Pull over shirt, Bra    Upper body assist Assist Level: Independent with assistive device    Lower Body Dressing/Undressing Lower body dressing      What is the patient wearing?: Underwear/pull up, Pants     Lower body assist Assist for lower body dressing: Independent with assitive device     Toileting Toileting    Toileting assist Assist for toileting: Supervision/Verbal cueing  Transfers Chair/bed transfer  Transfers assist     Chair/bed transfer assist level: Independent with assistive device     Locomotion Ambulation   Ambulation assist      Assist level: Minimal Assistance - Patient > 75% Assistive device: Walker-rolling Max distance: 30'   Walk 10 feet activity   Assist     Assist level: Contact Guard/Touching assist Assistive device: Walker-rolling   Walk 50 feet activity   Assist Walk 50 feet with 2 turns activity did not occur: Safety/medical concerns  Assist level:  (unable to perform due to fibromyaliga exacerbation) Assistive device: Walker-rolling    Walk 150 feet activity   Assist Walk 150 feet  activity did not occur: Safety/medical concerns  Assist level:  (unable to perform due to fibromyaliga exacerbation) Assistive device: Walker-rolling    Walk 10 feet on uneven surface  activity   Assist Walk 10 feet on uneven surfaces activity did not occur: Safety/medical concerns   Assist level:  (unable to perform due to fibromyaliga exacerbation) Assistive device: Walker-rolling   Wheelchair     Assist Is the patient using a wheelchair?: Yes (per PT Evaluation documentation) Type of Wheelchair: Manual    Wheelchair assist level: Supervision/Verbal cueing Max wheelchair distance: 150    Wheelchair 50 feet with 2 turns activity    Assist        Assist Level: Supervision/Verbal cueing   Wheelchair 150 feet activity     Assist      Assist Level: Supervision/Verbal cueing   Blood pressure (!) 150/88, pulse (!) 112, temperature 98.4 F (36.9 C), resp. rate 18, height 5' 3 (1.6 m), weight 69.1 kg, SpO2 100%.  Medical Problem List and Plan: 1. 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  Patient did not tolerate MRI             -patient may shower             -ELOS/Goals: plan d/c in 1-2 d , med adjustments for Tachycardia and HTN PT/OT/SLP supervision             2.  Antithrombotics: -DVT/anticoagulation:  Pharmaceutical: Lovenox  40mg  daily -antiplatelet therapy: DAPT X 3 months followed by ASA alone.  Confirmed in Neuro note 9/18 3. Pain Management:  Tylenol  prn.  4. Mood/Behavior/Sleep: LCSW to follow for evaluation and support --trazodone  25 mg at bedtime. Ativan  prn for anxiety/agitation due to aphasia?   -04/18/24 still having trouble sleeping-- scheduled melatonin 5mg              -antipsychotic agents: has had haldol  prn--last used on 09/18. Not reordered -DC summary indicates cymbalta  60mg  daily, does not appear she got this at the acute hospital. Pt states she uses 30mg  BID-- reordered 5.  Neuropsych/cognition: This patient may be capable of making decisions on her own behalf. 6. Skin/Wound Care: Routine pressure relief measures.             -- Monitor skin integrity 7. Fluids/Electrolytes/Nutrition: Monitor I/O. Routine labs. Vitamins/supplements. -04/25/24 pt not feeling great, fatigued, hasn't been eating well-- will get CBC/CMP which are pending, will start IVFs at 50/hr x12hrs. Labs look great will d/c IV after current bag     Latest Ref Rng & Units 04/26/2024    6:09 AM 04/25/2024   10:08 AM 04/23/2024    5:30 AM  BMP  Glucose 70 - 99 mg/dL 64  81    BUN 8 - 23 mg/dL 10  9  Creatinine 0.44 - 1.00 mg/dL 8.92  8.90    Sodium 864 - 145 mmol/L 139  140    Potassium 3.5 - 5.1 mmol/L 4.3  4.3  3.9   Chloride 98 - 111 mmol/L 109  103    CO2 22 - 32 mmol/L 18  18    Calcium  8.9 - 10.3 mg/dL 9.7  89.7      8. Diverticulitis/Colitis: Transitioned to cipro  500mg  bid and Flagyl  500 tid orally on 9/18, IV abx from 9/15-9/18 -WBC trending down today but also had drop in H/H. Monitor for signs of bleeding.               - Follow-up with gastroenterologist outpatient Cipro  and Metronidazole  until 9/27     Latest Ref Rng & Units 04/26/2024    6:09 AM 04/25/2024    4:34 PM 04/22/2024    5:49 AM  CBC  WBC 4.0 - 10.5 K/uL 10.9  11.2  10.2   Hemoglobin 12.0 - 15.0 g/dL 87.1  86.7  88.0   Hematocrit 36.0 - 46.0 % 39.0  40.0  36.7   Platelets 150 - 400 K/uL 200  209  207    Mild leukocytosis, afebrile, abd exam normal  9. HTN: Monitor BP TID--avoid hypoperfusion. BP meds have been resumed.  Monitor with activity. --On amlodipine  10mg  daily -04/24/24 BP mostly fine, a little up since yesterday but to avoid hypoperfusion, will leave meds as is for now.  -04/25/24 BP better this morning, monitor  Vitals:   04/23/24 0603 04/23/24 1416 04/23/24 2031 04/23/24 2100  BP: 133/73 (!) 149/72 (!) 140/67 (!) 140/67   04/24/24 0513 04/24/24 1325 04/24/24 2022 04/25/24 0443  BP: (!) 151/72 (!)  144/63 (!) 156/88 124/69   04/25/24 1450 04/25/24 1930 04/26/24 0347 04/26/24 0700  BP: (!) 154/89 (!) 154/88 (!) 151/89 (!) 150/88   Increase toprol  to 25mg  every day, reduce norvasc  to 5mg  , monitor  10. COPD: On Breztri  bid with Fasenra  every 8 weeks. 11. RA: Stable on Leflunomide  20mg  daily.  12. Hypokalemia: Being supplemented daily but still low Recurrent low KCL supplementation 9/25 x2 , recheck 9/26- normal will check on Monday 9/29 13. Hypomagnesemia: 1.8 on recheck 9/21, monitor as needed 14.  ABLA: Monitor for rectal bleeding. Hgb down from 12.8 to 11.2. Back up to 13.0 on 9/20-- 12.8 9/21, monitor routinely  Check stool OB, repeat H and H stable /improving so may d/c order      Latest Ref Rng & Units 04/26/2024    6:09 AM 04/25/2024    4:34 PM 04/22/2024    5:49 AM  CBC  WBC 4.0 - 10.5 K/uL 10.9  11.2  10.2   Hemoglobin 12.0 - 15.0 g/dL 87.1  86.7  88.0   Hematocrit 36.0 - 46.0 % 39.0  40.0  36.7   Platelets 150 - 400 K/uL 200  209  207    15, Delirium: Resolving but needed ativan   16. Leukocytosis: resolved on abx for diverticulitis, UA neg as expected given dual abx 17. HLD: continue rosuvastatin  20mg  daily 18. GERD: protonix  40mg  daily.   19.  Poor appetite likely due to diverticulitis and abx -04/25/24 pt not eating much this weekend, noted to be more fatigued by PT yesterday, not feeling great today   -U/A done and noninfectious   -CBC and CMP pending   -IVFs started as above (50/hr x12hrs)   -finish current IVF LOS: 9 days A FACE TO FACE EVALUATION WAS PERFORMED  Betty Golden 04/26/2024, 7:55 AM

## 2024-04-26 NOTE — Progress Notes (Signed)
 Patient ID: Betty Golden, female   DOB: 1946-02-20, 78 y.o.   MRN: [id number] Team and MD feel pt has not met goals due to flare up of fibromyalgia and will need a few more days. New discharge date 10/1

## 2024-04-26 NOTE — Progress Notes (Signed)
 Physical Therapy Session Note  Patient Details  Name: Betty Golden MRN: [id number] Date of Birth: 04-Sep-1945  Today's Date: 04/26/2024 PT Individual Time: 1350-1445 PT Individual Time Calculation (min): 55 min   Short Term Goals: Week 1:  PT Short Term Goal 1 (Week 1): STGs = LTGs  Skilled Therapeutic Interventions/Progress Updates:    Pt presents in room in recliner agreeable to PT. Pt denies pain at rest but states that she feels pain when ambulating in LLE. Pt reports feeling much better from this AM with good report from ECG. Session focused on therapeutic activities to promote activity tolerance, upright tolerance, and facilitating independence and safety with self care tasks and trasnfers. Pt completes transfers with CGA progress to close supervision with RW. Pt able to don bra while seated with supervision but requires min assist for sequencing donning shirt. Pt completes toilet transfer with CGA ambulating with RW, able to manage 3/3 toileting tasks with supervision, continent bowel and bladder, charted. Pt completes ambulatory transfer back to [facility] and transported to day room dependently for time management. Pt completes stand step transfer to/from nustep, completes continuous training on nustep with BUE/BLE to promote BUE/BLE muscle fiber recruitment and activity tolerance x10 on L4 resistance. Pt takes occasional short rest breaks throughout activity. Pt then ambulates self selected distance 34' and 51' with RW CGA, same person WC follow for pt fatigue, with pt taking extended seated rest breaks between trials for energy conservation and with fatigue. Pt returns to room and completes ambulatory transfer to recliner, remains seated in recliner with all needs within reach, cal light in place and chair alarm donned and activated at end of session.   Therapy Documentation Precautions:  Precautions Precautions: Fall Recall of Precautions/Restrictions: Impaired Precaution/Restrictions  Comments: fall risk and word salad on occassion Restrictions Weight Bearing Restrictions Per Provider Order: No     Therapy/Group: Individual Therapy  Reche Ohara PT, DPT 04/26/2024, 2:45 PM

## 2024-04-26 NOTE — Progress Notes (Signed)
 Occupational Therapy Session Note  Patient Details  Name: Betty Golden MRN: [id number] Date of Birth: 1946-03-10  Today's Date: 04/26/2024 OT Individual Time: 9069-9054 OT Individual Time Calculation (min): 15 min  and Today's Date: 04/26/2024 OT Missed Time: 60 Minutes Missed Time Reason: Patient fatigue (yellow MEWS, fatigued, elevated HR)   Short Term Goals: Week 1:  OT Short Term Goal 1 (Week 1): The pt will safely  complete UB/LB  bathing / dressing with ModI incorporating AE as needed. OT Short Term Goal 2 (Week 1): The pt will safely transfer to all surfaces with ModI incorporating AE as needed. OT Short Term Goal 3 (Week 1): The pt will demonstrate good safety awareness while completing a simple home making task at Mod I using AE as needed. OT Short Term Goal 4 (Week 1): The pt will complete a cognitive activity with accuracy 3 out of  5 attempt with minimal cues following  demonstration and verbal instruction.  Skilled Therapeutic Interventions/Progress Updates:    Pt resting in recliner upon arrival; IV running. Tachy HR and pt report of fatigue. LPN reported awaiting results of EKG. Pts dtr present and requested to be notified of results of EKG. LPN notified. Pt declined skilled OT services at this time with c/o generalized discomfort and fatigue. Will attempt to see pt again as schedule allows.  Therapy Documentation Precautions:  Precautions Precautions: Fall Recall of Precautions/Restrictions: Impaired Precaution/Restrictions Comments: fall risk and word salad on occassion Restrictions Weight Bearing Restrictions Per Provider Order: No General: General OT Amount of Missed Time: 60 Minutes Vital Signs: Therapy Vitals Temp: 98.2 F (36.8 C) Temp Source: Oral Pulse Rate: (!) 110 Resp: 15 BP: 138/83 Patient Position (if appropriate): Sitting Oxygen Therapy SpO2: 99 % O2 Device: Room Air Pain: Pt c/o generalized fibromyalgia discomfort; meds admin prior to  therapy     Therapy/Group: Individual Therapy  Maritza Debby Mare 04/26/2024, 9:55 AM

## 2024-04-27 MED ORDER — DEXTROSE 5 % AND 0.45 % NACL IV BOLUS
1000.0000 mL | Freq: Once | INTRAVENOUS | Status: AC
  Administered 2024-04-27: 1000 mL via INTRAVENOUS

## 2024-04-27 MED ORDER — POTASSIUM CHLORIDE CRYS ER 20 MEQ PO TBCR
30.0000 meq | EXTENDED_RELEASE_TABLET | Freq: Two times a day (BID) | ORAL | Status: DC
  Administered 2024-04-27 (×2): 30 meq via ORAL
  Filled 2024-04-27 (×2): qty 1

## 2024-04-27 NOTE — Progress Notes (Signed)
 PROGRESS NOTE   Subjective/Complaints:  Feels ok but has poor appetite.  Expressive aphasia limits Hx and ROS  ROS: aphasic  Objective:   No results found. Recent Labs    04/25/24 1634 04/26/24 0609  WBC 11.2* 10.9*  HGB 13.2 12.8  HCT 40.0 39.0  PLT 209 200    Recent Labs    04/25/24 1008 04/26/24 0609  NA 140 139  K 4.3 4.3  CL 103 109  CO2 18* 18*  GLUCOSE 81 64*  BUN 9 10  CREATININE 1.09* 1.07*  CALCIUM  10.2 9.7        Intake/Output Summary (Last 24 hours) at 04/27/2024 0731 Last data filed at 04/26/2024 1800 Gross per 24 hour  Intake 236 ml  Output --  Net 236 ml         Physical Exam: Vital Signs Blood pressure (!) 145/85, pulse 100, temperature 97.7 F (36.5 C), temperature source Oral, resp. rate 18, height 5' 3 (1.6 m), weight 69.1 kg, SpO2 94%.   General: No acute distress, resting comfortably in bed smiling   Mood and affect are appropriate Heart: Regular rate and rhythm no rubs murmurs or extra sounds Lungs: Clear to auscultation, breathing unlabored, no rales or wheezes Abdomen: Positive bowel sounds, soft nontender to palpation, nondistended Extremities: No clubbing, cyanosis, or edema Skin: No evidence of breakdown, no evidence of rash to exposed surfaces Neuro: mild aphasia noted   Neurologic: Cranial nerves II through XII intact,Sensory exam difficult to assess due to aphasia   Musculoskeletal: no hand or wrist deformities. No joint swelling    Neuro: Mental Status: limited by aphasia Speech/Language: Aphasia more expressive than receptive, cannot state day and date due to naming issues   Neuro:  Eyes without evidence of nystagmus  Tone is normal without evidence of spasticity  No evidence of trunkal ataxia  Motor strength is 5/5 in bilateral deltoid, biceps, triceps, finger flexors and extensors, wrist flexors and extensors, hip flexors, knee flexors and  extensors, ankle dorsiflexors, plantar flexors, invertors and evertors, toe flexors and extensors    REFLEXES: No ankle clonus   SENSORY: Normal to touch all 4 extremities   Coordination: Normal finger to nose bilaterally  Assessment/Plan: 1. Functional deficits which require 3+ hours per day of interdisciplinary therapy in a comprehensive inpatient rehab setting. Physiatrist is providing close team supervision and 24 hour management of active medical problems listed below. Physiatrist and rehab team continue to assess barriers to discharge/monitor patient progress toward functional and medical goals  Care Tool:  Bathing    Body parts bathed by patient: Right arm, Left arm, Chest, Abdomen, Front perineal area, Buttocks, Right upper leg, Left upper leg, Right lower leg, Left lower leg, Face         Bathing assist Assist Level: Supervision/Verbal cueing     Upper Body Dressing/Undressing Upper body dressing   What is the patient wearing?: Pull over shirt, Bra    Upper body assist Assist Level: Independent with assistive device    Lower Body Dressing/Undressing Lower body dressing      What is the patient wearing?: Underwear/pull up, Pants     Lower body assist Assist for lower  body dressing: Independent with assitive device     Toileting Toileting    Toileting assist Assist for toileting: Supervision/Verbal cueing     Transfers Chair/bed transfer  Transfers assist     Chair/bed transfer assist level: Independent with assistive device     Locomotion Ambulation   Ambulation assist      Assist level: Minimal Assistance - Patient > 75% Assistive device: Walker-rolling Max distance: 30'   Walk 10 feet activity   Assist     Assist level: Contact Guard/Touching assist Assistive device: Walker-rolling   Walk 50 feet activity   Assist Walk 50 feet with 2 turns activity did not occur: Safety/medical concerns  Assist level:  (unable to perform  due to fibromyaliga exacerbation) Assistive device: Walker-rolling    Walk 150 feet activity   Assist Walk 150 feet activity did not occur: Safety/medical concerns  Assist level:  (unable to perform due to fibromyaliga exacerbation) Assistive device: Walker-rolling    Walk 10 feet on uneven surface  activity   Assist Walk 10 feet on uneven surfaces activity did not occur: Safety/medical concerns   Assist level:  (unable to perform due to fibromyaliga exacerbation) Assistive device: Walker-rolling   Wheelchair     Assist Is the patient using a wheelchair?: Yes (per PT Evaluation documentation) Type of Wheelchair: Manual    Wheelchair assist level: Supervision/Verbal cueing Max wheelchair distance: 150    Wheelchair 50 feet with 2 turns activity    Assist        Assist Level: Supervision/Verbal cueing   Wheelchair 150 feet activity     Assist      Assist Level: Supervision/Verbal cueing   Blood pressure (!) 145/85, pulse 100, temperature 97.7 F (36.5 C), temperature source Oral, resp. rate 18, height 5' 3 (1.6 m), weight 69.1 kg, SpO2 94%.  Medical Problem List and Plan: 1. 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  Patient did not tolerate MRI             -patient may shower             -ELOS/Goals: plan d/c in am med adjustments for Tachycardia and HTN PT/OT/SLP supervision             2.  Antithrombotics: -DVT/anticoagulation:  Pharmaceutical: Lovenox  40mg  daily -antiplatelet therapy: DAPT X 3 months followed by ASA alone.  Confirmed in Neuro note 9/18 3. Pain Management:  Tylenol  prn.  4. Mood/Behavior/Sleep: LCSW to follow for evaluation and support --trazodone  25 mg at bedtime. Ativan  prn for anxiety/agitation due to aphasia?   -04/18/24 still having trouble sleeping-- scheduled melatonin 5mg              -antipsychotic agents: has had haldol  prn--last used on 09/18. Not reordered -DC  summary indicates cymbalta  60mg  daily, does not appear she got this at the acute hospital. Pt states she uses 30mg  BID-- reordered 5. Neuropsych/cognition: This patient may be capable of making decisions on her own behalf. 6. Skin/Wound Care: Routine pressure relief measures.             -- Monitor skin integrity 7. Fluids/Electrolytes/Nutrition: Monitor I/O. Routine labs. Vitamins/supplements. -recheck in am labs stable and normal , mild hypoglycemia mainly intake related     Latest Ref Rng & Units 04/26/2024    6:09 AM 04/25/2024   10:08 AM 04/23/2024    5:30 AM  BMP  Glucose 70 - 99 mg/dL 64  81  BUN 8 - 23 mg/dL 10  9    Creatinine 9.55 - 1.00 mg/dL 8.92  8.90    Sodium 864 - 145 mmol/L 139  140    Potassium 3.5 - 5.1 mmol/L 4.3  4.3  3.9   Chloride 98 - 111 mmol/L 109  103    CO2 22 - 32 mmol/L 18  18    Calcium  8.9 - 10.3 mg/dL 9.7  89.7      8. Diverticulitis/Colitis: Transitioned to cipro  500mg  bid and Flagyl  500 tid orally on 9/18, IV abx from 9/15-9/18 -WBC trending down today but also had drop in H/H. Monitor for signs of bleeding.               - Follow-up with gastroenterologist outpatient Cipro  and Metronidazole  until 9/27     Latest Ref Rng & Units 04/26/2024    6:09 AM 04/25/2024    4:34 PM 04/22/2024    5:49 AM  CBC  WBC 4.0 - 10.5 K/uL 10.9  11.2  10.2   Hemoglobin 12.0 - 15.0 g/dL 87.1  86.7  88.0   Hematocrit 36.0 - 46.0 % 39.0  40.0  36.7   Platelets 150 - 400 K/uL 200  209  207    Mild leukocytosis, afebrile, abd exam normal  9. HTN: Monitor BP TID--avoid hypoperfusion. BP meds have been resumed.  Monitor with activity. --On amlodipine  10mg  daily -04/24/24 BP mostly fine, a little up since yesterday but to avoid hypoperfusion, will leave meds as is for now.  -04/25/24 BP better this morning, monitor  Vitals:   04/24/24 1325 04/24/24 2022 04/25/24 0443 04/25/24 1450  BP: (!) 144/63 (!) 156/88 124/69 (!) 154/89   04/25/24 1930 04/26/24 0347 04/26/24 0700  04/26/24 0857  BP: (!) 154/88 (!) 151/89 (!) 150/88 138/83   04/26/24 1257 04/26/24 1601 04/26/24 2000 04/27/24 0108  BP: 133/85 (!) 142/73 (!) 158/85 (!) 145/85   Increase toprol  to 25mg  every day, reduce norvasc  to 5mg  , monitor  HR and BP in acceptable range today  10. COPD: On Breztri  bid with Fasenra  every 8 weeks. 11. RA: Stable on Leflunomide  20mg  daily.  12. Hypokalemia: Being supplemented daily but still low    Latest Ref Rng & Units 04/26/2024    6:09 AM 04/25/2024   10:08 AM 04/23/2024    5:30 AM  BMP  Glucose 70 - 99 mg/dL 64  81    BUN 8 - 23 mg/dL 10  9    Creatinine 9.55 - 1.00 mg/dL 8.92  8.90    Sodium 864 - 145 mmol/L 139  140    Potassium 3.5 - 5.1 mmol/L 4.3  4.3  3.9   Chloride 98 - 111 mmol/L 109  103    CO2 22 - 32 mmol/L 18  18    Calcium  8.9 - 10.3 mg/dL 9.7  89.7      13. Hypomagnesemia: 1.8 on recheck 9/21, monitor as needed 14.  ABLA: Monitor for rectal bleeding. Hgb down from 12.8 to 11.2. Back up to 13.0 on 9/20-- 12.8 9/21, monitor routinely  Check stool OB, repeat H and H stable /improving so may d/c order      Latest Ref Rng & Units 04/26/2024    6:09 AM 04/25/2024    4:34 PM 04/22/2024    5:49 AM  CBC  WBC 4.0 - 10.5 K/uL 10.9  11.2  10.2   Hemoglobin 12.0 - 15.0 g/dL 87.1  86.7  88.0   Hematocrit 36.0 -  46.0 % 39.0  40.0  36.7   Platelets 150 - 400 K/uL 200  209  207    15, Delirium: Resolving but needed ativan   16. Leukocytosis: resolved on abx for diverticulitis, UA neg as expected given dual abx 17. HLD: continue rosuvastatin  20mg  daily 18. GERD: protonix  40mg  daily.   19.  Poor appetite likely due to diverticulitis and abx -04/25/24 pt not eating much this weekend, noted to be more fatigued by PT yesterday, not feeling great today   -U/A done and noninfectious   -CBC and CMP pending   -IVFs started as above (50/hr x12hrs)  Encourage po intake - still poor, abd exam normal having BMs , meds reviewed do not see meds that would cause nausea,  hopefully family can encourage at home will run D5.45NS x 1 L prior to d/c LOS: 10 days A FACE TO FACE EVALUATION WAS PERFORMED  Betty Golden 04/27/2024, 7:31 AM

## 2024-04-27 NOTE — Plan of Care (Signed)
  Problem: RH Swallowing Goal: LTG Patient will consume least restrictive diet using compensatory strategies with assistance (SLP) Description: LTG:  Patient will consume least restrictive diet using compensatory strategies with assistance (SLP) Outcome: Completed/Met   Problem: RH Comprehension Communication Goal: LTG Patient will comprehend basic/complex auditory (SLP) Description: LTG: Patient will comprehend basic/complex auditory information with cues (SLP). Outcome: Completed/Met   Problem: RH Expression Communication Goal: LTG Patient will increase word finding of common (SLP) Description: LTG:  Patient will increase word finding of common objects/daily info/abstract thoughts with cues using compensatory strategies (SLP). Outcome: Completed/Met   Problem: RH Attention Goal: LTG Patient will demonstrate this level of attention during functional activites (SLP) Description: LTG:  Patient will will demonstrate this level of attention during functional activites (SLP) Outcome: Completed/Met   Problem: RH Expression Communication Goal: LTG Patient will verbally express basic/complex needs(SLP) Description: LTG:  Patient will verbally express basic/complex needs, wants or ideas with cues  (SLP) Outcome: Not Met (cont to require occasional modA)

## 2024-04-27 NOTE — Progress Notes (Signed)
 Occupational Therapy Session Note  Patient Details  Name: Betty Golden MRN: [id number] Date of Birth: [birth year]  Today's Date: 04/27/2024 OT Individual Time: 0950-1100 OT Individual Time Calculation (min): 70 min    Short Term Goals: Week 1:  OT Short Term Goal 1 (Week 1): The pt will safely  complete UB/LB  bathing / dressing with ModI incorporating AE as needed. OT Short Term Goal 2 (Week 1): The pt will safely transfer to all surfaces with ModI incorporating AE as needed. OT Short Term Goal 3 (Week 1): The pt will demonstrate good safety awareness while completing a simple home making task at Mod I using AE as needed. OT Short Term Goal 4 (Week 1): The pt will complete a cognitive activity with accuracy 3 out of  5 attempt with minimal cues following  demonstration and verbal instruction.      Skilled Therapeutic Interventions/Progress Updates:    Pt received in recliner stating she felt much better today and was eager for a shower.   Pt had her clothing picked out and ready to go.   Using her RW she ambulated to bathroom without A, completed toileting, and undressed.  Supervision to step into walk in shower. Using tub bench she bathed without A or S needed.   She transferred to chair to dry off and dress. She was moving quickly and then became fatigued so encouraged her to slow down and breathe during the steps of dressing. Today she donned B socks and shoes without difficulty.    Stood at sink to cleanse teeth.   Pt then felt fatigued again so had her sit in recliner to rest.  Used this time to review her pt education book, focusing on the BEFAST sign and encouraged her to post in on the refridgerator.    Pt stated along with her expressive aphasia, she is having great difficulty comprehending the words she is reading. She understands oral instructions.   She commented it has been hard to read her Bible on the Bible app. Showed her where the speaker button was on the app so she  could listen to the scripture vs reading it.  Pt listened and said that was very helpful as she could understand the spoken word.  Pt's PT arrived for the next session.   Therapy Documentation Precautions:  Precautions Precautions: Fall Recall of Precautions/Restrictions: Impaired Precaution/Restrictions Comments: fall risk and word salad on occassion Restrictions Weight Bearing Restrictions Per Provider Order: No   Vital Signs: Therapy Vitals Temp: 97.7 F (36.5 C) Pulse Rate: (!) 103 Resp: 20 BP: 124/70 Patient Position (if appropriate): Sitting Oxygen Therapy SpO2: 92 % O2 Device: Room Air Pain: Pain Assessment Pain Scale: 0-10 Pain Score: 0-No pain ADL: ADL Equipment Provided:  (RW) Eating: Independent Where Assessed-Eating: Chair Grooming: Independent Where Assessed-Grooming: Standing at sink Upper Body Bathing: Modified independent Where Assessed-Upper Body Bathing: Shower Lower Body Bathing: Modified independent Where Assessed-Lower Body Bathing: Shower Upper Body Dressing: Independent Where Assessed-Upper Body Dressing: Edge of bed Lower Body Dressing: Modified independent Where Assessed-Lower Body Dressing: Chair Toileting: Modified independent Where Assessed-Toileting: Teacher, adult education: Engineer, agricultural Method: Proofreader: Other (comment), Grab bars Tub/Shower Transfer: Close supervison Web designer Method: Ship broker: Information systems manager with back, Acupuncturist: Close supervision Film/video editor Method: Designer, industrial/product: Shower seat with back    Therapy/Group: Individual Therapy  Erilyn Pearman 04/27/2024, 12:34 PM

## 2024-04-27 NOTE — Progress Notes (Signed)
 Inpatient Rehabilitation Care Coordinator Discharge Note   Patient Details  Name: Betty Golden MRN: [id number] Date of Birth: 1945/08/27   Discharge location: [facility] with daughter and son in-law daughter can provide 24/7 supervision  Length of Stay: 11 days  Discharge activity level: supervision level  Home/community participation: active  Patient response un:Yzjouy Literacy - How often do you need to have someone help you when you read instructions, pamphlets, or other written material from your doctor or pharmacy?: Often  Patient response un:Dnrpjo Isolation - How often do you feel lonely or isolated from those around you?: Rarely  Services provided included: MD, RD, PT, OT, SLP, RN, CM, TR, Pharmacy, SW  Financial Services:  Field seismologist Utilized: Private Insurance [employer]  Choices offered to/list presented to: pt and daughter  Follow-up services arranged:  Home Health, Patient/Family request agency HH/DME Home Health Agency: [facility]  PT  OT  SP      HH/DME Requested Agency: Active with [facility] prior to admission  Patient response to transportation need: Is the patient able to respond to transportation needs?: Yes In the past 12 months, has lack of transportation kept you from medical appointments or from getting medications?: No In the past 12 months, has lack of transportation kept you from meetings, work, or from getting things needed for daily living?: No   Patient/Family verbalized understanding of follow-up arrangements:  Yes  Individual responsible for coordination of the follow-up plan: Betty Golden-daughter [phone]  Confirmed correct DME delivered: Raymonde Asberry MATSU 04/27/2024    Comments (or additional information):Pt did well and then had a fibromyalgia flare up which extended her stay here. Daughter was in for education and aware of Mom's care needs.   Summary of Stay    Date/Time Discharge Planning CSW  04/21/24 [phone] Home with  daughter and son in-law daughter is retired and can be there with her. Will set up education and work on discharge needs. RGD       Meeka Cartelli G

## 2024-04-27 NOTE — Progress Notes (Signed)
 Inpatient Rehabilitation Discharge Medication Review by a Pharmacist  A complete drug regimen review was completed for this patient to identify any potential clinically significant medication issues.  High Risk Drug Classes Is patient taking? Indication by Medication  Antipsychotic No   Anticoagulant No   Antibiotic Yes Clindamycin lotion - to affected areas with prior skin cancer removal per Dermatology   Opioid No   Antiplatelet Yes Aspirin  81 mg and Clopidogrel  for 3 months, then Aspirin  alone - CVA prophylaxis  Hypoglycemics/insulin No   Vasoactive Medication Yes Amlodipine , metoprolol  XL - hypertension  Chemotherapy No   Other Yes Duloxetine  - mood, fibromyalgia Trelegy - COPD Fasrena - asthma Arava , Cimzia - RA, fibromylagia Fluticasone  nasal spray - allergies Melatonin, Trazodone  - sleep Pantoprazole  - GERD Rosuvastatin  - hyperlipidemia Potassium chloride , Vitamin B-12 - supplements  PRNs: Acetaminophen  - mild pain Albuterol  inhaler - wheezing, shortness of breath     Type of Medication Issue Identified Description of Issue Recommendation(s)  Drug Interaction(s) (clinically significant)     Duplicate Therapy     Allergy     No Medication Administration End Date  Aspirin  81 mg and Clopidogrel  for 3 months, then Aspirin  alone. Begun 9/16. Expect Clopidogrel  to stop on 07/13/24.  Incorrect Dose     Additional Drug Therapy Needed     Significant med changes from prior encounter (inform family/care partners about these prior to discharge). Atorvastatin  changed to Rosuvastatin .  Potassium chloride  dose modified.  Discontinued: Losartan -hydrochlorothiazide , Pregabalin , Colestipol , Cetirizine, Calcium  and Magnesium  supplements. Communicate changes with patient/family prior to discharge.   Other  Breztri  for Trelegy while inpatient.  Back to Trelegy at discharge.    Clinically significant medication issues were identified that warrant physician communication and  completion of prescribed/recommended actions by midnight of the next day:  No  Pharmacist comments:  - antibiotics completed on 9/27 - Aspirin  81 mg and Clopidogrel  for 3 months, then Aspirin  alone. [place] 9/16 so anticipate Clopidogrel  to stop on 07/13/24.  Time spent performing this drug regimen review (minutes):  [address]   Genaro Zebedee Calin, [place] 04/27/2024 3:26 PM

## 2024-04-27 NOTE — Plan of Care (Signed)
  Problem: RH Balance Goal: LTG Patient will maintain dynamic standing with ADLs (OT) Description: LTG:  Patient will maintain dynamic standing balance with assist during activities of daily living (OT)  Outcome: Completed/Met   Problem: Sit to Stand Goal: LTG:  Patient will perform sit to stand in prep for activites of daily living with assistance level (OT) Description: LTG:  Patient will perform sit to stand in prep for activites of daily living with assistance level (OT) Outcome: Completed/Met   Problem: RH Bathing Goal: LTG Patient will bathe all body parts with assist levels (OT) Description: LTG: Patient will bathe all body parts with assist levels (OT) Outcome: Completed/Met   Problem: RH Dressing Goal: LTG Patient will perform lower body dressing w/assist (OT) Description: LTG: Patient will perform lower body dressing with assist, with/without cues in positioning using equipment (OT) Outcome: Completed/Met   Problem: RH Toileting Goal: LTG Patient will perform toileting task (3/3 steps) with assistance level (OT) Description: LTG: Patient will perform toileting task (3/3 steps) with assistance level (OT)  Outcome: Completed/Met   Problem: RH Toilet Transfers Goal: LTG Patient will perform toilet transfers w/assist (OT) Description: LTG: Patient will perform toilet transfers with assist, with/without cues using equipment (OT) Outcome: Completed/Met   Problem: RH Tub/Shower Transfers Goal: LTG Patient will perform tub/shower transfers w/assist (OT) Description: LTG: Patient will perform tub/shower transfers with assist, with/without cues using equipment (OT) Outcome: Completed/Met   

## 2024-04-27 NOTE — Progress Notes (Signed)
 Physical Therapy Session Note  Patient Details  Name: Betty Golden MRN: [id number] Date of Birth: [birth year]  Today's Date: 04/27/2024 PT Individual Time: 8482-8451 PT Individual Time Calculation (min): 31 min  And  Today's Date: 04/27/2024 PT Missed Time: 14 Minutes Missed Time Reason: Patient fatigue  Short Term Goals: Week 1:  PT Short Term Goal 1 (Week 1): STGs = LTGs PT Short Term Goal 1 - Progress (Week 1): Progressing toward goal Week 2:  PT Short Term Goal 1 (Week 2): STGs = LTGs d/t ELOS  Skilled Therapeutic Interventions/Progress Updates:  Patient seated upright in recliner on entrance to room. Patient alert and agreeable to PT session but then relates that despite requesting to shower during this session earlier in day, is now fatigued  Patient with no pain complaint at start of session.  Pt guided in home-environment distances while collecting needed items to dress for bed. Requires CGA for navigating tight spaces but then close supervision for the rest of the room. Min vc/ tc for navigating room and vc for safety awareness.   Is able to ambulate to bathroom using RW with close supervision. Toilet transfer with supervision and pt manages to doff clothing with supervision, requires vc and MinA to assist with LB dressing while seated on toilet. Dons gown with setup. Performs pericare with supervision. Then is able to ambulate into room to sink, to wash hands and then to bed in order to rest d/t related fatigue. Performs bed mobility with supervision. Is able to position self in bed and provided with heated blanket on request. No s/s of elevated heart rate throughout session.   Patient supine in bed at end of session with brakes locked, bed alarm set, and all needs within reach.  Pt missed of skilled therapy due to related fatigue. Will re-attempt as schedule and pt availability permits.  Therapy Documentation Precautions:  Precautions Precautions: Fall Recall of  Precautions/Restrictions: Impaired Precaution/Restrictions Comments: fall risk and word salad on occassion Restrictions Weight Bearing Restrictions Per Provider Order: No General: PT Amount of Missed Time (min): 14 Minutes PT Missed Treatment Reason: Patient fatigue  Pain:  No pain related this session.   Therapy/Group: Individual Therapy  Mliss DELENA Milliner PT, DPT, CSRS 04/26/2024, 3:21 PM

## 2024-04-27 NOTE — Progress Notes (Signed)
 Physical Therapy Session Note  Patient Details  Name: Betty Golden MRN: [id number] Date of Birth: November 02, 1945  Today's Date: 04/27/2024 PT Individual Time: 1100-1130; 1455 - 1530 PT Individual Time Calculation (min): 30 min; 35 min   Short Term Goals: Week 2:  PT Short Term Goal 1 (Week 2): STGs = LTGs d/t ELOS  SESSION 1 Skilled Therapeutic Interventions/Progress Updates: Patient sitting in WC on entrance to room. Patient alert and agreeable to PT session.   Patient reported no pain during session. Pt stated still feeling weak following fibromyalgia flare up with transpired last week, but a little better.  Pt transported to main gym in [facility] dependently for energy conservation. Pt navigated 4 (6) steps x 2 with rest break between with instructions to use B UE support on LHR ascending (R descending), and to laterally step up step. Pt performed with overall supervision and improvement in strength and ability to perform safely vs last time performed with this PTA on 9/27.  Pt feels better about d/c home tomorrow (10/1), and was grateful to have gotten 2 more days of therapy following exacerbation.   Patient demonstrates increased fall risk as noted by score of 21/56 on Berg Balance Scale.  (<36= high risk for falls, close to 100%; 37-45 significant >80%; 46-51 moderate >50%; 52-55 lower >25%)  Patient sitting in WC at end of session with brakes locked, belt alarm set, and all needs within reach.  SESSION 2 Skilled Therapeutic Interventions/Progress Updates: Patient sitting in recliner with family present on entrance to room. Patient alert and agreeable to PT session.   Patient reported no pain. PTA, pt and pt family discussed pt's CLOF and her progression since fibromyalgia exacerbation. PTA further educated family and pt to continue to use RW vs SPC at home for safety, and verbal education on how pt ascends/descends stairs safely via lateral step to and use of B UE on hand rail per home set  up. Pt family stated that garage entrance allows pt to enter bedroom without having to navigate stairs. PTA further stated to pt and family that pt will require supervision and assistance to bring RW when navigating stairs (family stated this was done prior to admission). Pt also with concerns about pt's labs with nsg arriving to further discuss concerns. Pt family and pt with no further questions or concerns. Pt performed transfers via RW modI. Pt ambulated 88' x 1 and 194' x 1 with VC to engage R glute to improve L knee extension and upright posture. Pt with improved cadence and ambulatory strength this session for grad day vs last time with this PTA on 9/27. Pt required seated rest breaks due to fatigue. Pt transported to room in [facility] for IV team to don line. Pt performed supine<>sit from EOB independently.   Patient supine at end of session with brakes locked, bed alarm set, and all needs within reach.       Therapy Documentation Precautions:  Precautions Precautions: Fall Recall of Precautions/Restrictions: Impaired Precaution/Restrictions Comments: fall risk and word salad on occassion Restrictions Weight Bearing Restrictions Per Provider Order: No  Therapy/Group: Individual Therapy  Pacey Willadsen PTA 04/27/2024, 3:44 PM

## 2024-04-27 NOTE — Progress Notes (Signed)
 Speech Language Pathology Discharge Summary  Patient Details  Name: Betty Golden MRN: [id number] Date of Birth: 1946-05-21  Date of Discharge from SLP service:April 27, 2024  Today's Date: 04/27/2024 SLP Individual Time: 0800-0859 SLP Individual Time Calculation (min): 59 min   Skilled Therapeutic Interventions:   Skilled therapy session focused on re-assessment of language, cognition and dysphagia. SLP re-evaluated patients communication through informal measures. During automatic naming task, patient able to recall name, numbers 1-10 and months, however with increased difficulty during days of the week due to occasional instances of semantic paraphasias/apraxia. Patient completed confrontational and responsive naming tasks with 100% accuracy. During receptive languauge tasks, patient with 100% accuracy for simple yes/no questions and 90% accuracy with complex. Patient followed 1-3 step commands given occasional minA. Cognitively, patient is oriented x4 and demonstrated improvements in sustained attention to task. SLP re-evaluated dysphagia through observation of PO consumption. Patient consumed regular solids and thin liquids with timely mastication and complete oral clearance. Patient with x1 throat clear after solids which is consistent with initial evaluation and dx of GERD. Recommend continuation of current diet. Patient left in bed with alarm set and call bell in reach. Continue POC.       Patient has met 4 of 5 long term goals.  Patient to discharge at overall Modified Independent;Min;Mod level.  Reasons goals not met: cont to require modA to communicate verbally at times   Clinical Impression/Discharge Summary: Pt has made great gains and has met 4 of 5 LTG's this admission due to improved communication, dysphagia and cognition. Pt is currently an overall minA for cognitive tasks and requires mod i cues for utilization of swallowing compensatory strategies to minimize overt s/sx  of aspiration with regular/thin diet. 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 Pt would benefit from OP f/u ST services to maximize communication in order to maximize functional independence.   Care Partner:  Caregiver Able to Provide Assistance: Yes  Type of Caregiver Assistance: Physical;Cognitive  Recommendation:  Outpatient SLP  Rationale for SLP Follow Up: Maximize functional communication   Equipment: n/a   Reasons for discharge: Discharged from hospital   Patient/Family Agrees with Progress Made and Goals Achieved: Yes    Knox Holdman M.A., CCC-SLP 04/27/2024, 8:48 AM

## 2024-04-27 NOTE — Progress Notes (Signed)
 Physical Therapy Discharge Summary  Patient Details  Name: Betty Golden MRN: [id number] Date of Birth: [birth year]  Date of Discharge from PT service:April 27, 2024  {CHL IP REHAB PT TIME CALCULATION:304800500}   Patient has met 10 of 10 long term goals due to {due un:6958322}.  Patient to discharge at [facility] level {LOA:3049010}.   Patient's care partner {care partner:3041650} to provide the necessary {assistance:3041652} assistance at discharge.  Recommendation:  Patient will benefit from ongoing skilled PT services in {setting:3041680} to continue to advance safe functional mobility, address ongoing impairments in ***, and minimize fall risk.  Equipment: {equipment:3041657}  Reasons for discharge: {Reason for discharge:3049018}  Patient/family agrees with progress made and goals achieved: {Pt/Family agree with progress/goals:3049020}  PT Discharge Precautions/Restrictions Precautions Precautions: Fall Restrictions Weight Bearing Restrictions Per Provider Order: No Pain Pain Assessment Pain Scale: 0-10 Pain Score: 0-No pain Pain Interference Pain Interference Pain Effect on Sleep: 1. Rarely or not at all Pain Interference with Therapy Activities: 1. Rarely or not at all Pain Interference with Day-to-Day Activities: 1. Rarely or not at all Vision/Perception  Vision - History Ability to See in Adequate Light: 0 Adequate  Cognition Overall Cognitive Status: Impaired/Different from baseline Arousal/Alertness: Awake/alert Orientation Level: Oriented X4 Year: 2025 Month: September Day of Week: Correct Attention: Sustained Sustained Attention: Appears intact Memory: Impaired Memory Impairment: Decreased recall of new information Awareness: Appears intact Problem Solving: Appears intact Executive Function: Sequencing Sequencing: Impaired Sequencing Impairment: Verbal basic Safety/Judgment: Appears intact Sensation Sensation Light Touch: Appears  Intact Hot/Cold: Appears Intact Proprioception: Appears Intact Coordination Gross Motor Movements are Fluid and Coordinated: Yes Motor  Motor Motor: Other (comment) Motor - Discharge Observations: generalized weakness from baseline fibromyalgia  Mobility Bed Mobility Bed Mobility: Sit to Supine;Supine to Sit Rolling Right: Independent Rolling Left: Independent Supine to Sit: Independent Sit to Supine: Independent Transfers Transfers: Sit to Stand;Stand to Sit;Stand Pivot Transfers Sit to Stand: Independent with assistive device Stand to Sit: Independent with assistive device Stand Pivot Transfers: Independent with assistive device Transfer (Assistive device): Rolling walker Locomotion  Gait Ambulation: Yes Gait Assistance: Supervision/Verbal cueing Gait Distance (Feet): 193 Feet Assistive device: Rolling walker Gait Assistance Details: Tactile cues for posture Gait Gait: Yes Gait Pattern: Impaired Gait Pattern: Step-through pattern;Trunk flexed;Left flexed knee in stance (Decreased R glute activation leading to L knee in flexion (has improved since evaluation) with pt able to increase with multimodal cuing) Stairs / Additional Locomotion Stairs: Yes Stairs Assistance: Supervision/Verbal cueing Stair Management Technique: One rail Left (B UE on L rail ascending with lateral step to) Number of Stairs: 4 Height of Stairs: 6 Wheelchair Mobility Wheelchair Mobility: Yes Wheelchair Assistance: Doctor, general practice: Both upper extremities Distance: 150  Trunk/Postural Assessment  Cervical Assessment Cervical Assessment: Within Functional Limits Thoracic Assessment Thoracic Assessment: Within Functional Limits Lumbar Assessment Lumbar Assessment: Within Functional Limits Postural Control Postural Control: Within Functional Limits  Balance Berg Balance Test Sit to Stand: Able to stand  independently using hands Standing Unsupported: Able to  stand 30 seconds unsupported Sitting with Back Unsupported but Feet Supported on Floor or Stool: Able to sit safely and securely 2 minutes Stand to Sit: Controls descent by using hands Transfers: Able to transfer with verbal cueing and /or supervision Standing Unsupported with Eyes Closed: Able to stand 10 seconds with supervision Standing Ubsupported with Feet Together: Needs help to attain position and unable to hold for 15 seconds From Standing, Reach Forward with Outstretched Arm: Reaches forward but needs supervision From Standing Position,  Pick up Object from Floor: Unable to try/needs assist to keep balance From Standing Position, Turn to Look Behind Over each Shoulder: Turn sideways only but maintains balance Turn 360 Degrees: Needs assistance while turning Standing Unsupported, Alternately Place Feet on Step/Stool: Able to complete >2 steps/needs minimal assist Standing Unsupported, One Foot in Front: Loses balance while stepping or standing Standing on One Leg: Unable to try or needs assist to prevent fall Total Score: 21 Static Sitting Balance Static Sitting - Balance Support: Bilateral upper extremity supported Static Sitting - Level of Assistance: 7: Independent Static Standing Balance Static Standing - Balance Support: Bilateral upper extremity supported Static Standing - Level of Assistance: 6: Modified independent (Device/Increase time) Dynamic Standing Balance Dynamic Standing - Balance Support: Bilateral upper extremity supported;During functional activity Dynamic Standing - Level of Assistance: 5: Stand by assistance Extremity Assessment  RLE Assessment RLE Assessment: Exceptions to [facility] General Strength Comments: Grossly 3+ to 4-/5 LLE Assessment LLE Assessment: Exceptions to [facility] General Strength Comments: Grossly 3+ to 4-/5   Lovell Nuttall PTA   04/27/2024, 11:38 AM

## 2024-04-28 ENCOUNTER — Other Ambulatory Visit (HOSPITAL_COMMUNITY): Payer: Self-pay

## 2024-04-28 LAB — BASIC METABOLIC PANEL WITH GFR
Anion gap: 11 (ref 5–15)
BUN: 11 mg/dL (ref 8–23)
CO2: 22 mmol/L (ref 22–32)
Calcium: 10 mg/dL (ref 8.9–10.3)
Chloride: 108 mmol/L (ref 98–111)
Creatinine, Ser: 1.07 mg/dL — ABNORMAL HIGH (ref 0.44–1.00)
GFR, Estimated: 54 mL/min — ABNORMAL LOW (ref >60)
Glucose, Bld: 89 mg/dL (ref 70–99)
Potassium: 4.6 mmol/L (ref 3.5–5.1)
Sodium: 141 mmol/L (ref 135–145)

## 2024-04-28 MED ORDER — ROSUVASTATIN CALCIUM 20 MG PO TABS
20.0000 mg | ORAL_TABLET | Freq: Every day | ORAL | 0 refills | Status: DC
  Filled 2024-04-28: qty 30, 30d supply, fill #0

## 2024-04-28 MED ORDER — POTASSIUM CHLORIDE CRYS ER 10 MEQ PO TBCR
30.0000 meq | EXTENDED_RELEASE_TABLET | Freq: Every day | ORAL | 0 refills | Status: DC
  Filled 2024-04-28: qty 90, 30d supply, fill #0

## 2024-04-28 MED ORDER — DULOXETINE HCL 30 MG PO CPEP
30.0000 mg | ORAL_CAPSULE | Freq: Two times a day (BID) | ORAL | 0 refills | Status: DC
  Filled 2024-04-28: qty 60, 30d supply, fill #0

## 2024-04-28 MED ORDER — AMLODIPINE BESYLATE 10 MG PO TABS: 10.0000 mg | ORAL_TABLET | Freq: Every day | ORAL | Status: DC

## 2024-04-28 MED ORDER — PANTOPRAZOLE SODIUM 40 MG PO TBEC
40.0000 mg | DELAYED_RELEASE_TABLET | Freq: Every day | ORAL | 0 refills | Status: DC
  Filled 2024-04-28: qty 30, 30d supply, fill #0

## 2024-04-28 MED ORDER — TRAZODONE HCL 50 MG PO TABS
25.0000 mg | ORAL_TABLET | Freq: Every day | ORAL | 0 refills | Status: DC
  Filled 2024-04-28: qty 30, 60d supply, fill #0

## 2024-04-28 MED ORDER — ASPIRIN 81 MG PO TBEC
81.0000 mg | DELAYED_RELEASE_TABLET | Freq: Every day | ORAL | 0 refills | Status: DC
  Filled 2024-04-28: qty 100, 100d supply, fill #0

## 2024-04-28 MED ORDER — POTASSIUM CHLORIDE CRYS ER 20 MEQ PO TBCR
20.0000 meq | EXTENDED_RELEASE_TABLET | Freq: Every day | ORAL | 0 refills | Status: DC
  Filled 2024-04-28: qty 30, 30d supply, fill #0

## 2024-04-28 MED ORDER — MELATONIN 5 MG PO TABS
5.0000 mg | ORAL_TABLET | Freq: Every day | ORAL | 0 refills | Status: DC
  Filled 2024-04-28: qty 30, 30d supply, fill #0

## 2024-04-28 MED ORDER — CLOPIDOGREL BISULFATE 75 MG PO TABS
75.0000 mg | ORAL_TABLET | Freq: Every day | ORAL | 0 refills | Status: DC
  Filled 2024-04-28: qty 90, 90d supply, fill #0

## 2024-04-28 MED ORDER — POTASSIUM CHLORIDE CRYS ER 20 MEQ PO TBCR
30.0000 meq | EXTENDED_RELEASE_TABLET | Freq: Every day | ORAL | Status: DC
  Administered 2024-04-28: 30 meq via ORAL
  Filled 2024-04-28: qty 1

## 2024-04-28 MED ORDER — AMLODIPINE BESYLATE 5 MG PO TABS
5.0000 mg | ORAL_TABLET | Freq: Every day | ORAL | 0 refills | Status: DC
  Filled 2024-04-28: qty 30, 30d supply, fill #0

## 2024-04-28 MED ORDER — AMLODIPINE BESYLATE 10 MG PO TABS
10.0000 mg | ORAL_TABLET | Freq: Every day | ORAL | 0 refills | Status: DC
  Filled 2024-04-28: qty 30, 30d supply, fill #0

## 2024-04-28 MED ORDER — METOPROLOL SUCCINATE ER 25 MG PO TB24
25.0000 mg | ORAL_TABLET | Freq: Every day | ORAL | 0 refills | Status: DC
  Filled 2024-04-28: qty 30, 30d supply, fill #0

## 2024-04-28 MED ORDER — AMLODIPINE BESYLATE 10 MG PO TABS
10.0000 mg | ORAL_TABLET | Freq: Every day | ORAL | Status: DC
  Administered 2024-04-28: 10 mg via ORAL
  Filled 2024-04-28: qty 1

## 2024-04-28 NOTE — Progress Notes (Addendum)
 PROGRESS NOTE   Subjective/Complaints:  Feels ok but has poor appetite.  Expressive aphasia limits Hx and ROS Feels ready to go home, discussed need for GI f/u in regards to colitis and reduced appetite  ROS: aphasic  Objective:   No results found. Recent Labs    04/25/24 1634 04/26/24 0609  WBC 11.2* 10.9*  HGB 13.2 12.8  HCT 40.0 39.0  PLT 209 200    Recent Labs    04/26/24 0609 04/28/24 0537  NA 139 141  K 4.3 4.6  CL 109 108  CO2 18* 22  GLUCOSE 64* 89  BUN 10 11  CREATININE 1.07* 1.07*  CALCIUM  9.7 10.0        Intake/Output Summary (Last 24 hours) at 04/28/2024 0753 Last data filed at 04/28/2024 0338 Gross per 24 hour  Intake 1266.83 ml  Output --  Net 1266.83 ml         Physical Exam: Vital Signs Blood pressure (!) 152/84, pulse 97, temperature 98.5 F (36.9 C), temperature source Oral, resp. rate 18, height 5' 3 (1.6 m), weight 69.1 kg, SpO2 97%.   General: No acute distress, resting comfortably in bed smiling   Mood and affect are appropriate Heart: Regular rate and rhythm no rubs murmurs or extra sounds Lungs: Clear to auscultation, breathing unlabored, no rales or wheezes Abdomen: Positive bowel sounds, soft nontender to palpation, nondistended Extremities: No clubbing, cyanosis, or edema Skin: No evidence of breakdown, no evidence of rash to exposed surfaces Neuro: mild aphasia noted   Neurologic: Cranial nerves II through XII intact,Sensory exam difficult to assess due to aphasia   Musculoskeletal: no hand or wrist deformities. No joint swelling    Neuro: Mental Status: limited by aphasia Speech/Language: Aphasia more expressive than receptive, cannot state day and date due to naming issues   Neuro:  Eyes without evidence of nystagmus  Tone is normal without evidence of spasticity  No evidence of trunkal ataxia  Motor strength is 5/5 in bilateral deltoid, biceps,  triceps, finger flexors and extensors, wrist flexors and extensors, hip flexors, knee flexors and extensors, ankle dorsiflexors, plantar flexors, invertors and evertors, toe flexors and extensors    REFLEXES: No ankle clonus   SENSORY: Normal to touch all 4 extremities   Coordination: Normal finger to nose bilaterally  Assessment/Plan: 1. Functional deficits Stable for D/C today F/u PCP in 3-4 weeks F/u PM&R 2 weeks See D/C summary See D/C instructions  Care Tool:  Bathing    Body parts bathed by patient: Right arm, Left arm, Chest, Abdomen, Front perineal area, Buttocks, Right upper leg, Left upper leg, Right lower leg, Left lower leg, Face         Bathing assist Assist Level: Independent with assistive device     Upper Body Dressing/Undressing Upper body dressing   What is the patient wearing?: Pull over shirt, Bra    Upper body assist Assist Level: Independent    Lower Body Dressing/Undressing Lower body dressing      What is the patient wearing?: Underwear/pull up, Pants     Lower body assist Assist for lower body dressing: Independent with assitive device     Toileting Toileting  Toileting assist Assist for toileting: Independent with assistive device     Transfers Chair/bed transfer  Transfers assist     Chair/bed transfer assist level: Independent with assistive device     Locomotion Ambulation   Ambulation assist      Assist level: Minimal Assistance - Patient > 75% Assistive device: Walker-rolling Max distance: 30'   Walk 10 feet activity   Assist     Assist level: Contact Guard/Touching assist Assistive device: Walker-rolling   Walk 50 feet activity   Assist Walk 50 feet with 2 turns activity did not occur: Safety/medical concerns  Assist level:  (unable to perform due to fibromyaliga exacerbation) Assistive device: Walker-rolling    Walk 150 feet activity   Assist Walk 150 feet activity did not occur:  Safety/medical concerns  Assist level:  (unable to perform due to fibromyaliga exacerbation) Assistive device: Walker-rolling    Walk 10 feet on uneven surface  activity   Assist Walk 10 feet on uneven surfaces activity did not occur: Safety/medical concerns   Assist level:  (unable to perform due to fibromyaliga exacerbation) Assistive device: Walker-rolling   Wheelchair     Assist Is the patient using a wheelchair?: Yes (per PT Evaluation documentation) Type of Wheelchair: Manual    Wheelchair assist level: Supervision/Verbal cueing Max wheelchair distance: 150    Wheelchair 50 feet with 2 turns activity    Assist        Assist Level: Supervision/Verbal cueing   Wheelchair 150 feet activity     Assist      Assist Level: Supervision/Verbal cueing   Blood pressure (!) 152/84, pulse 97, temperature 98.5 F (36.9 C), temperature source Oral, resp. rate 18, height 5' 3 (1.6 m), weight 69.1 kg, SpO2 97%.  Medical Problem List and Plan: 1. 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  Patient did not tolerate MRI             -patient may shower         D/c today              2.  Antithrombotics: -DVT/anticoagulation:  Pharmaceutical: Lovenox  40mg  daily -antiplatelet therapy: DAPT X 3 months followed by ASA alone.  Confirmed in Neuro note 9/18 3. Pain Management:  Tylenol  prn.  4. Mood/Behavior/Sleep: LCSW to follow for evaluation and support --trazodone  25 mg at bedtime. Ativan  prn for anxiety/agitation due to aphasia?   -04/18/24 still having trouble sleeping-- scheduled melatonin 5mg              -antipsychotic agents: has had haldol  prn--last used on 09/18. Not reordered -DC summary indicates cymbalta  60mg  daily, does not appear she got this at the acute hospital. Pt states she uses 30mg  BID-- reordered 5. Neuropsych/cognition: This patient may be capable of making decisions on her own behalf. 6.  Skin/Wound Care: Routine pressure relief measures.             -- Monitor skin integrity 7. Fluids/Electrolytes/Nutrition: Monitor I/O. Routine labs. Vitamins/supplements. -recheck in am labs stable and normal , mild hypoglycemia mainly intake related     Latest Ref Rng & Units 04/28/2024    5:37 AM 04/26/2024    6:09 AM 04/25/2024   10:08 AM  BMP  Glucose 70 - 99 mg/dL 89  64  81   BUN 8 - 23 mg/dL 11  10  9    Creatinine 0.44 - 1.00 mg/dL 8.92  8.92  8.90   Sodium 135 -  145 mmol/L 141  139  140   Potassium 3.5 - 5.1 mmol/L 4.6  4.3  4.3   Chloride 98 - 111 mmol/L 108  109  103   CO2 22 - 32 mmol/L 22  18  18    Calcium  8.9 - 10.3 mg/dL 89.9  9.7  89.7     8. Diverticulitis/Colitis: Transitioned to cipro  500mg  bid and Flagyl  500 tid orally on 9/18, IV abx from 9/15-9/18 -WBC trending down today but also had drop in H/H. Monitor for signs of bleeding.               - Follow-up with gastroenterologist outpatient Cipro  and Metronidazole  until 9/27     Latest Ref Rng & Units 04/26/2024    6:09 AM 04/25/2024    4:34 PM 04/22/2024    5:49 AM  CBC  WBC 4.0 - 10.5 K/uL 10.9  11.2  10.2   Hemoglobin 12.0 - 15.0 g/dL 87.1  86.7  88.0   Hematocrit 36.0 - 46.0 % 39.0  40.0  36.7   Platelets 150 - 400 K/uL 200  209  207    Mild leukocytosis, afebrile, abd exam normal  9. HTN: Monitor BP TID--avoid hypoperfusion. BP meds have been resumed.  Monitor with activity. --On amlodipine  10mg  daily -04/24/24 BP mostly fine, a little up since yesterday but to avoid hypoperfusion, will leave meds as is for now.  -04/25/24 BP better this morning, monitor  Vitals:   04/26/24 0347 04/26/24 0700 04/26/24 0857 04/26/24 1257  BP: (!) 151/89 (!) 150/88 138/83 133/85   04/26/24 1601 04/26/24 2000 04/27/24 0108 04/27/24 0900  BP: (!) 142/73 (!) 158/85 (!) 145/85 124/70   04/27/24 0943 04/27/24 1326 04/27/24 1935 04/28/24 0249  BP: 124/70 (!) 144/80 (!) 152/88 (!) 152/84   Increase toprol  to 25mg  every day, reduce  norvasc  to 5mg  , monitor  HR and BP mildly elevated increase norvasc  to 10mg   (home dose) 10. COPD: On Breztri  bid with Fasenra  every 8 weeks. 11. RA: Stable on Leflunomide  20mg  daily.  12. Hypokalemia: trending up reduce KCL to daily     Latest Ref Rng & Units 04/28/2024    5:37 AM 04/26/2024    6:09 AM 04/25/2024   10:08 AM  BMP  Glucose 70 - 99 mg/dL 89  64  81   BUN 8 - 23 mg/dL 11  10  9    Creatinine 0.44 - 1.00 mg/dL 8.92  8.92  8.90   Sodium 135 - 145 mmol/L 141  139  140   Potassium 3.5 - 5.1 mmol/L 4.6  4.3  4.3   Chloride 98 - 111 mmol/L 108  109  103   CO2 22 - 32 mmol/L 22  18  18    Calcium  8.9 - 10.3 mg/dL 89.9  9.7  89.7     13. Hypomagnesemia: 1.8 on recheck 9/21, monitor as needed 14.  ABLA: Monitor for rectal bleeding. Hgb down from 12.8 to 11.2. Back up to 13.0 on 9/20-- 12.8 9/21, monitor routinely  Check stool OB, repeat H and H stable /improving so may d/c order      Latest Ref Rng & Units 04/26/2024    6:09 AM 04/25/2024    4:34 PM 04/22/2024    5:49 AM  CBC  WBC 4.0 - 10.5 K/uL 10.9  11.2  10.2   Hemoglobin 12.0 - 15.0 g/dL 87.1  86.7  88.0   Hematocrit 36.0 - 46.0 % 39.0  40.0  36.7  Platelets 150 - 400 K/uL 200  209  207    15, Delirium: Resolving but needed ativan   16. Leukocytosis: resolved on abx for diverticulitis, UA neg as expected given dual abx 17. HLD: continue rosuvastatin  20mg  daily 18. GERD: protonix  40mg  daily.   19.  Poor appetite likely due to diverticulitis and abx -04/25/24 pt not eating much this weekend, noted to be more fatigued by PT yesterday, not feeling great today   -U/A done and noninfectious   -CBC and CMP pending   -IVFs started as above (50/hr x12hrs)  Encourage po intake - still poor, abd exam normal having BMs , meds reviewed do not see meds that would cause nausea, hopefully family can encourage at home will run D5.45NS x 1 L prior to d/c LOS: 11 days A FACE TO FACE EVALUATION WAS PERFORMED  Prentice FORBES Compton 04/28/2024, 7:53 AM

## 2024-04-28 NOTE — Progress Notes (Signed)
 Fluid hung per MD order. Upon infusion, observed swelling above IV location. Immediately stopped infusion. Removed IV and dress old site. Patient complained that site felt strange but denied pain. Applied warm compress and elevated  right arm on a pillow. MD and Charge Nurse notified. Ongoing assessment showed gradual decreased of swelling. Currently old IV site shows no swelling. Patient denied any pain or discomfort in right arm. Will continue to monitor.

## 2024-04-28 NOTE — Plan of Care (Signed)
 Patient calm and cooperative A&O X3, forgetful at times. Patient able to ambulate to bathroom one person assist with walker. Patient left with call bell in reach, bed in lowest position and side rails up.  Problem: Education: Goal: Knowledge of General Education information will improve Description: Including pain rating scale, medication(s)/side effects and non-pharmacologic comfort measures Outcome: Progressing   Problem: Health Behavior/Discharge Planning: Goal: Ability to manage health-related needs will improve Outcome: Progressing   Problem: Clinical Measurements: Goal: Ability to maintain clinical measurements within normal limits will improve Outcome: Progressing   Problem: Activity: Goal: Risk for activity intolerance will decrease Outcome: Progressing   Problem: Coping: Goal: Level of anxiety will decrease Outcome: Progressing   Problem: Elimination: Goal: Will not experience complications related to bowel motility Outcome: Progressing

## 2024-04-28 NOTE — Discharge Summary (Signed)
 Physician Discharge Summary  Patient ID: Betty Golden MRN: [id number] DOB/AGE: 1946-05-06 78 y.o.  Admit date: 04/17/2024 Discharge date: 04/28/2024  Discharge Diagnoses:  Principal Problem:   Acute ischemic left middle cerebral artery (MCA) stroke [facility]) Active Problems:   Essential hypertension   Hypokalemia   Mixed rhinitis   RA (rheumatoid arthritis) (HCC)   Diverticulitis of colon   Discharged Condition: stable  Significant Diagnostic Studies: N/A   Labs:  Basic Metabolic Panel: Recent Labs  Lab 04/22/24 0549 04/23/24 0530 04/25/24 1008 04/26/24 0609 04/28/24 0537  NA 139  --  140 139 141  K 3.0* 3.9 4.3 4.3 4.6  CL 108  --  103 109 108  CO2 23  --  18* 18* 22  GLUCOSE 78  --  81 64* 89  BUN 6*  --  9 10 11   CREATININE 1.11*  --  1.09* 1.07* 1.07*  CALCIUM  9.3  --  10.2 9.7 10.0    CBC: Recent Labs  Lab 04/22/24 0549 04/25/24 1634 04/26/24 0609  WBC 10.2 11.2* 10.9*  NEUTROABS  --  5.9  --   HGB 11.9* 13.2 12.8  HCT 36.7 40.0 39.0  MCV 87.2 87.3 87.4  PLT 207 209 200    CBG: No results for input(s): GLUCAP in the last 168 hours.  Brief HPI:   Betty Golden is a 78 y.o. female with history of RA, FM, cervical stenosis, post lam syndrome, c diff colitis, recent admission for confusion with poor po intake who was admitted on 04/12/24 with reports of confusion, poor p.o. intake, diarrhea and generalized bodyaches.  She was found to have leukocytosis with hypokalemia and was moaning with abdominal tenderness at admission.  CT abdomen pelvis showed mild colonic diverticulitis and she was started on IV antibiotics and treated with fluid boluses.    CT head showed suspicion of recent right parietal infarct and CTA head/neck showed new occlusion of proximal M2 branch of left MCA new since 02/28/2024. Neurology recommended DAPT for left parietal stroke due to occluded left MCA.  MRI recommended but patient declined this x 2.  Neurology recommends DAPT x  3 months followed by aspirin  alone.  She continues to be limited by mild left-sided weakness with aphasia, apraxia with confabulation and jargon.  She was requiring contact-guard assist with ADLs and mod assist with mobility.  She was independent prior to admission and CIR was recommended to functional decline.  Hospital Course: Jaydyn J Profit was admitted to rehab 04/17/2024 for inpatient therapies to consist of PT, ST and OT at least three hours five days a week. Past admission physiatrist, therapy team and rehab RN have worked together to provide customized collaborative inpatient rehab. Her blood pressures were monitored on TID basis and has been stable. Overall. She was maintained on DAPT during her stay and is tolerating this without any side effects.  Mood has been stable without any signs of agitation.  Cymbalta  was resumed at 30 mg twice daily per home regimen.  Her p.o. intake remained poor and nutritional supplements were offered on twice daily to 3 times daily basis.  She has had issues with mild hypoglycemia due to poor intake.  She was transferred disposition to Cipro  500 mg twice daily and Flagyl  and maintained on this through 09/27.  No reports of abdominal pain or diarrhea.  She has had issues with hypokalemia which has been supplemented during her stay.  Supplement was increased to 30 mEq at discharge as he was starting  to trend upwards.  Follow-up CBC shows H&H to be improved and mild recurrence and leukocytosis.   Hospital course significant for fatigue, nausea and weakness on 09/28.  Work up negative for infection and was treated with IVF with improvement. Her LOS was extended by a day to help recover her energy level and reach her goal. Supervision is recommended for safety. She will continue to receive follow up HHPT, HHOT, HHST by [facility] after discharge.    Rehab course: During patient's stay in rehab weekly team conferences were held to monitor patient's progress, set  goals and discuss barriers to discharge. At admission, patient required CGA to min assist with ADL tasks and min assist with mobility. 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 She  has had improvement in activity tolerance, balance, postural control as well as ability to compensate for deficits.  She is independent for transfers and is able to ambulate 193 feet with use of RW, supervision and verbal cues.  She continues to be limited by expressive > receptive aphasia and apraxia.  She requires mod assist at times to express her needs. She continues to be at high risk for falls with Lars Balance score at 21/56. Family education has been completed regarding all aspects of af care and safety.     Disposition:  Home with Home Health.  Diet: Regular.   Special Instructions:  Recheck BMET in 1-2 weeks to monitor potassium level. 2. No driving or strenuous activity.  Discharge Instructions     Ambulatory referral to Neurology   Complete by: As directed    An appointment is requested in approximately: 6 weeks   Ambulatory referral to Physical Medicine Rehab   Complete by: As directed       Allergies as of 04/28/2024       Reactions   Hydromorphone  Anaphylaxis   Sulfa Antibiotics Rash        Medication List     STOP taking these medications    atorvastatin  10 MG tablet Commonly known as: LIPITOR   ciprofloxacin  500 MG tablet Commonly known as: CIPRO    colestipol  1 g tablet Commonly known as: COLESTID    losartan -hydrochlorothiazide  100-12.5 MG tablet Commonly known as: HYZAAR   metroNIDAZOLE  500 MG tablet Commonly known as: FLAGYL    pregabalin  50 MG capsule Commonly known as: LYRICA        TAKE these medications    acetaminophen  325 MG tablet Commonly known as: TYLENOL  Take 1-2 tablets (325-650 mg total) by mouth every 4 (four) hours as needed for mild pain (pain score 1-3).   albuterol  108 (90  Base) MCG/ACT inhaler Commonly known as: ProAir  HFA Inhale 2 puffs into the lungs every 4 (four) hours as needed for wheezing or shortness of breath.   amLODipine  10 MG tablet Commonly known as: NORVASC  Take 1 tablet (10 mg total) by mouth daily.   Aspirin  Adult Low Strength 81 MG tablet Generic drug: aspirin  EC Take 1 tablet (81 mg total) by mouth daily. Swallow whole.   CIMZIA [place] Inject 1 Dose into the skin every 14 (fourteen) days.   clindamycin 1 % lotion Commonly known as: CLEOCIN T Apply 1 Application topically daily. Notes to patient: Resume at home.   clopidogrel  75 MG tablet Commonly known as: PLAVIX  Take 1 tablet (75 mg total) by mouth daily.   cyanocobalamin  1000 MCG tablet Commonly known as: VITAMIN B12 Take 1 tablet (1,000 mcg total) by mouth daily.  DULoxetine  30 MG capsule Commonly known as: CYMBALTA  Take 1 capsule (30 mg total) by mouth 2 (two) times daily.   Fasenra  30 MG/ML prefilled syringe Generic drug: benralizumab  Inject 1 mL (30 mg total) into the skin every 8 (eight) weeks. Notes to patient: Resume at home   fluticasone  50 MCG/ACT nasal spray Commonly known as: FLONASE  SPRAY 2 SPRAYS INTO EACH NOSTRIL EVERY DAY Notes to patient: Resume at home   IRON PO Take 65 mg by mouth daily. Notes to patient: Resume at home   leflunomide  20 MG tablet Commonly known as: ARAVA  Take 20 mg by mouth daily.   melatonin 5 MG Tabs Take 1 tablet (5 mg total) by mouth at bedtime.   metoprolol  succinate 25 MG 24 hr tablet Commonly known as: Toprol  XL Take 1 tablet (25 mg total) by mouth daily.   MULTIVITAMIN PO Take 1 tablet by mouth daily at 6 (six) AM.   pantoprazole  40 MG tablet Commonly known as: PROTONIX  Take 1 tablet (40 mg total) by mouth daily.   potassium chloride  10 MEQ tablet Commonly known as: KLOR-CON  M Take 3 tablets (30 mEq total) by mouth daily. What changed:  medication strength how much to take when to take this   Restasis   0.05 % ophthalmic emulsion Generic drug: cycloSPORINE  Place 1 drop into both eyes 2 (two) times daily.   rosuvastatin  20 MG tablet Commonly known as: CRESTOR  Take 1 tablet (20 mg total) by mouth daily. What changed: how much to take   traZODone  50 MG tablet Commonly known as: DESYREL  Take 0.5 tablets (25 mg total) by mouth at bedtime.   Trelegy Ellipta  200-62.5-25 MCG/ACT Aepb Generic drug: Fluticasone -Umeclidin-Vilant Inhale 1 puff into the lungs in the morning.        Follow-up Information     Irven Ozell DEL, MD Follow up.   Specialty: Family Medicine Why: Call in 1-2 days for post hospital follow up Contact information: [address] [place] [address] [phone]         Kirsteins, Prentice BRAVO, MD Follow up.   Specialty: Physical Medicine and Rehabilitation Why: office will call you with follow up appointment Contact information: [address] [place] [address] [phone]         [facility] Follow up.   Why: office will call you with follow up appointment Contact information: [address]     [address] [place]  [address] [phone]                Signed: Sharlet GORMAN Schmitz 05/02/2024, 10:58 PM

## 2024-04-29 DIAGNOSIS — K5732 Diverticulitis of large intestine without perforation or abscess without bleeding: Secondary | ICD-10-CM | POA: Insufficient documentation

## 2024-05-05 ENCOUNTER — Ambulatory Visit: Attending: Cardiology

## 2024-05-06 ENCOUNTER — Other Ambulatory Visit: Payer: Self-pay

## 2024-05-06 ENCOUNTER — Inpatient Hospital Stay (HOSPITAL_COMMUNITY)
Admission: EM | Admit: 2024-05-06 | Discharge: 2024-05-09 | DRG: 871 | Disposition: A | Discharge status: Home Health | Attending: Internal Medicine | Admitting: Internal Medicine

## 2024-05-06 ENCOUNTER — Emergency Department (HOSPITAL_COMMUNITY)

## 2024-05-06 ENCOUNTER — Encounter (HOSPITAL_COMMUNITY): Payer: Self-pay | Admitting: Internal Medicine

## 2024-05-06 DIAGNOSIS — K219 Gastro-esophageal reflux disease without esophagitis: Secondary | ICD-10-CM | POA: Diagnosis present

## 2024-05-06 DIAGNOSIS — I5032 Chronic diastolic (congestive) heart failure: Secondary | ICD-10-CM | POA: Diagnosis present

## 2024-05-06 DIAGNOSIS — J410 Simple chronic bronchitis: Secondary | ICD-10-CM | POA: Diagnosis not present

## 2024-05-06 DIAGNOSIS — J188 Other pneumonia, unspecified organism: Principal | ICD-10-CM

## 2024-05-06 DIAGNOSIS — M069 Rheumatoid arthritis, unspecified: Secondary | ICD-10-CM | POA: Diagnosis present

## 2024-05-06 DIAGNOSIS — A419 Sepsis, unspecified organism: Secondary | ICD-10-CM | POA: Diagnosis present

## 2024-05-06 DIAGNOSIS — J449 Chronic obstructive pulmonary disease, unspecified: Secondary | ICD-10-CM

## 2024-05-06 DIAGNOSIS — R4701 Aphasia: Secondary | ICD-10-CM

## 2024-05-06 DIAGNOSIS — J440 Chronic obstructive pulmonary disease with acute lower respiratory infection: Secondary | ICD-10-CM | POA: Diagnosis present

## 2024-05-06 DIAGNOSIS — E876 Hypokalemia: Secondary | ICD-10-CM | POA: Diagnosis present

## 2024-05-06 DIAGNOSIS — I2510 Atherosclerotic heart disease of native coronary artery without angina pectoris: Secondary | ICD-10-CM | POA: Diagnosis present

## 2024-05-06 DIAGNOSIS — Z7982 Long term (current) use of aspirin: Secondary | ICD-10-CM

## 2024-05-06 DIAGNOSIS — R000 Tachycardia, unspecified: Secondary | ICD-10-CM

## 2024-05-06 DIAGNOSIS — Z8673 Personal history of transient ischemic attack (TIA), and cerebral infarction without residual deficits: Secondary | ICD-10-CM | POA: Diagnosis not present

## 2024-05-06 DIAGNOSIS — I2489 Other forms of acute ischemic heart disease: Secondary | ICD-10-CM | POA: Diagnosis present

## 2024-05-06 DIAGNOSIS — Z833 Family history of diabetes mellitus: Secondary | ICD-10-CM

## 2024-05-06 DIAGNOSIS — R0781 Pleurodynia: Secondary | ICD-10-CM | POA: Diagnosis not present

## 2024-05-06 DIAGNOSIS — I130 Hypertensive heart and chronic kidney disease with heart failure and stage 1 through stage 4 chronic kidney disease, or unspecified chronic kidney disease: Secondary | ICD-10-CM | POA: Diagnosis present

## 2024-05-06 DIAGNOSIS — F32A Depression, unspecified: Secondary | ICD-10-CM | POA: Diagnosis present

## 2024-05-06 DIAGNOSIS — A4159 Other Gram-negative sepsis: Secondary | ICD-10-CM | POA: Diagnosis present

## 2024-05-06 DIAGNOSIS — Z8249 Family history of ischemic heart disease and other diseases of the circulatory system: Secondary | ICD-10-CM

## 2024-05-06 DIAGNOSIS — R002 Palpitations: Secondary | ICD-10-CM | POA: Diagnosis present

## 2024-05-06 DIAGNOSIS — E785 Hyperlipidemia, unspecified: Secondary | ICD-10-CM | POA: Diagnosis present

## 2024-05-06 DIAGNOSIS — I700 Atherosclerosis of aorta: Secondary | ICD-10-CM | POA: Diagnosis present

## 2024-05-06 DIAGNOSIS — J189 Pneumonia, unspecified organism: Secondary | ICD-10-CM | POA: Diagnosis present

## 2024-05-06 DIAGNOSIS — M797 Fibromyalgia: Secondary | ICD-10-CM | POA: Diagnosis present

## 2024-05-06 DIAGNOSIS — R079 Chest pain, unspecified: Secondary | ICD-10-CM | POA: Diagnosis not present

## 2024-05-06 DIAGNOSIS — I10 Essential (primary) hypertension: Secondary | ICD-10-CM | POA: Diagnosis present

## 2024-05-06 DIAGNOSIS — Z8709 Personal history of other diseases of the respiratory system: Secondary | ICD-10-CM | POA: Diagnosis not present

## 2024-05-06 DIAGNOSIS — R7989 Other specified abnormal findings of blood chemistry: Secondary | ICD-10-CM

## 2024-05-06 DIAGNOSIS — N179 Acute kidney failure, unspecified: Secondary | ICD-10-CM | POA: Diagnosis present

## 2024-05-06 DIAGNOSIS — R6520 Severe sepsis without septic shock: Secondary | ICD-10-CM | POA: Diagnosis present

## 2024-05-06 DIAGNOSIS — Z87891 Personal history of nicotine dependence: Secondary | ICD-10-CM

## 2024-05-06 DIAGNOSIS — F411 Generalized anxiety disorder: Secondary | ICD-10-CM | POA: Diagnosis present

## 2024-05-06 DIAGNOSIS — Z8739 Personal history of other diseases of the musculoskeletal system and connective tissue: Secondary | ICD-10-CM

## 2024-05-06 DIAGNOSIS — E872 Acidosis, unspecified: Secondary | ICD-10-CM | POA: Diagnosis present

## 2024-05-06 DIAGNOSIS — Z885 Allergy status to narcotic agent status: Secondary | ICD-10-CM

## 2024-05-06 DIAGNOSIS — Z7902 Long term (current) use of antithrombotics/antiplatelets: Secondary | ICD-10-CM

## 2024-05-06 DIAGNOSIS — I69320 Aphasia following cerebral infarction: Secondary | ICD-10-CM | POA: Diagnosis not present

## 2024-05-06 DIAGNOSIS — Z882 Allergy status to sulfonamides status: Secondary | ICD-10-CM

## 2024-05-06 DIAGNOSIS — Z8619 Personal history of other infectious and parasitic diseases: Secondary | ICD-10-CM

## 2024-05-06 DIAGNOSIS — A414 Sepsis due to anaerobes: Secondary | ICD-10-CM | POA: Diagnosis present

## 2024-05-06 DIAGNOSIS — N1831 Chronic kidney disease, stage 3a: Secondary | ICD-10-CM | POA: Diagnosis present

## 2024-05-06 DIAGNOSIS — N189 Chronic kidney disease, unspecified: Secondary | ICD-10-CM

## 2024-05-06 DIAGNOSIS — Z79899 Other long term (current) drug therapy: Secondary | ICD-10-CM | POA: Diagnosis not present

## 2024-05-06 DIAGNOSIS — Z1152 Encounter for screening for COVID-19: Secondary | ICD-10-CM

## 2024-05-06 DIAGNOSIS — Z823 Family history of stroke: Secondary | ICD-10-CM

## 2024-05-06 DIAGNOSIS — R0681 Apnea, not elsewhere classified: Secondary | ICD-10-CM

## 2024-05-06 DIAGNOSIS — Z825 Family history of asthma and other chronic lower respiratory diseases: Secondary | ICD-10-CM

## 2024-05-06 HISTORY — DX: Sepsis, unspecified organism: A41.9

## 2024-05-06 LAB — I-STAT CG4 LACTIC ACID, ED
Lactic Acid, Venous: 2 mmol/L (ref 0.5–1.9)
Lactic Acid, Venous: 1.9 mmol/L (ref 0.5–1.9)

## 2024-05-06 LAB — CBC
HCT: 38.3 % (ref 36.0–46.0)
Hemoglobin: 12.9 g/dL (ref 12.0–15.0)
MCH: 28.4 pg (ref 26.0–34.0)
MCHC: 33.7 g/dL (ref 30.0–36.0)
MCV: 84.4 fL (ref 80.0–100.0)
Platelets: 213 K/uL (ref 150–400)
RBC: 4.54 MIL/uL (ref 3.87–5.11)
RDW: 13.3 % (ref 11.5–15.5)
WBC: 18.3 K/uL — ABNORMAL HIGH (ref 4.0–10.5)
nRBC: 0 % (ref 0.0–0.2)

## 2024-05-06 LAB — I-STAT VENOUS BLOOD GAS, ED
Acid-base deficit: 1 mmol/L (ref 0.0–2.0)
Bicarbonate: 23.8 mmol/L (ref 20.0–28.0)
Calcium, Ion: 0.9 mmol/L — ABNORMAL LOW (ref 1.15–1.40)
HCT: 37 % (ref 36.0–46.0)
Hemoglobin: 12.6 g/dL (ref 12.0–15.0)
O2 Saturation: 67 %
Potassium: 2.8 mmol/L — ABNORMAL LOW (ref 3.5–5.1)
Sodium: 142 mmol/L (ref 135–145)
TCO2: 25 mmol/L (ref 22–32)
pCO2, Ven: 38.3 mmHg — ABNORMAL LOW (ref 44–60)
pH, Ven: 7.401 (ref 7.25–7.43)
pO2, Ven: 35 mmHg (ref 32–45)

## 2024-05-06 LAB — BASIC METABOLIC PANEL WITH GFR
Anion gap: 17 — ABNORMAL HIGH (ref 5–15)
BUN: 11 mg/dL (ref 8–23)
CO2: 18 mmol/L — ABNORMAL LOW (ref 22–32)
Calcium: 9.6 mg/dL (ref 8.9–10.3)
Chloride: 103 mmol/L (ref 98–111)
Creatinine, Ser: 1.19 mg/dL — ABNORMAL HIGH (ref 0.44–1.00)
GFR, Estimated: 47 mL/min — ABNORMAL LOW (ref >60)
Glucose, Bld: 119 mg/dL — ABNORMAL HIGH (ref 70–99)
Potassium: 3.3 mmol/L — ABNORMAL LOW (ref 3.5–5.1)
Sodium: 138 mmol/L (ref 135–145)

## 2024-05-06 LAB — T4, FREE: Free T4: 1.12 ng/dL (ref 0.61–1.12)

## 2024-05-06 LAB — TSH: TSH: 2.627 u[IU]/mL (ref 0.350–4.500)

## 2024-05-06 LAB — TROPONIN I (HIGH SENSITIVITY)
Troponin I (High Sensitivity): 38 ng/L — ABNORMAL HIGH (ref <18)
Troponin I (High Sensitivity): 36 ng/L — ABNORMAL HIGH (ref <18)

## 2024-05-06 LAB — MAGNESIUM: Magnesium: 1.6 mg/dL — ABNORMAL LOW (ref 1.7–2.4)

## 2024-05-06 MED ORDER — LEFLUNOMIDE 10 MG PO TABS: 20.0000 mg | ORAL_TABLET | Freq: Every day | ORAL | Status: DC

## 2024-05-06 MED ORDER — SODIUM CHLORIDE 0.9 % IV BOLUS
1000.0000 mL | Freq: Once | INTRAVENOUS | Status: AC
  Administered 2024-05-07: 1000 mL via INTRAVENOUS

## 2024-05-06 MED ORDER — SODIUM CHLORIDE 0.9 % IV SOLN
250.0000 mL | INTRAVENOUS | Status: AC | PRN
  Administered 2024-05-07: 250 mL via INTRAVENOUS

## 2024-05-06 MED ORDER — LORAZEPAM 2 MG/ML IJ SOLN
1.0000 mg | Freq: Once | INTRAMUSCULAR | Status: DC | PRN
  Filled 2024-05-06: qty 1

## 2024-05-06 MED ORDER — LORAZEPAM 2 MG/ML IJ SOLN
2.0000 mg | Freq: Once | INTRAMUSCULAR | Status: AC | PRN
  Administered 2024-05-06: 2 mg via INTRAVENOUS

## 2024-05-06 MED ORDER — ASPIRIN 81 MG PO CHEW
324.0000 mg | CHEWABLE_TABLET | Freq: Once | ORAL | Status: AC
  Administered 2024-05-06: 324 mg via ORAL
  Filled 2024-05-06: qty 4

## 2024-05-06 MED ORDER — FERROUS SULFATE 325 (65 FE) MG PO TABS
325.0000 mg | ORAL_TABLET | Freq: Every day | ORAL | Status: DC
  Administered 2024-05-07 – 2024-05-09 (×3): 325 mg via ORAL
  Filled 2024-05-06 (×3): qty 1

## 2024-05-06 MED ORDER — SODIUM CHLORIDE 0.9% FLUSH
3.0000 mL | Freq: Two times a day (BID) | INTRAVENOUS | Status: DC
  Administered 2024-05-07 – 2024-05-09 (×4): 3 mL via INTRAVENOUS

## 2024-05-06 MED ORDER — SODIUM CHLORIDE 0.9% FLUSH
3.0000 mL | INTRAVENOUS | Status: DC | PRN
  Administered 2024-05-08: 3 mL via INTRAVENOUS

## 2024-05-06 MED ORDER — HEPARIN SODIUM (PORCINE) 5000 UNIT/ML IJ SOLN
5000.0000 [IU] | Freq: Three times a day (TID) | INTRAMUSCULAR | Status: DC
  Administered 2024-05-07 – 2024-05-09 (×8): 5000 [IU] via SUBCUTANEOUS
  Filled 2024-05-06 (×8): qty 1

## 2024-05-06 MED ORDER — ACETAMINOPHEN 650 MG RE SUPP: 650.0000 mg | Freq: Four times a day (QID) | RECTAL | Status: DC | PRN

## 2024-05-06 MED ORDER — TRAZODONE HCL 50 MG PO TABS
25.0000 mg | ORAL_TABLET | Freq: Every day | ORAL | Status: DC
  Administered 2024-05-07 – 2024-05-08 (×3): 25 mg via ORAL
  Filled 2024-05-06 (×3): qty 1

## 2024-05-06 MED ORDER — ONDANSETRON HCL 4 MG/2ML IJ SOLN
4.0000 mg | Freq: Four times a day (QID) | INTRAMUSCULAR | Status: DC | PRN
  Administered 2024-05-07 – 2024-05-09 (×2): 4 mg via INTRAVENOUS
  Filled 2024-05-06 (×2): qty 2

## 2024-05-06 MED ORDER — METOPROLOL SUCCINATE ER 25 MG PO TB24
25.0000 mg | ORAL_TABLET | Freq: Every day | ORAL | Status: DC
Start: 2024-05-07 — End: 2024-05-07

## 2024-05-06 MED ORDER — CLOPIDOGREL BISULFATE 75 MG PO TABS
75.0000 mg | ORAL_TABLET | Freq: Every day | ORAL | Status: DC
Start: 2024-05-07 — End: 2024-05-09
  Administered 2024-05-07 – 2024-05-09 (×3): 75 mg via ORAL
  Filled 2024-05-06 (×3): qty 1

## 2024-05-06 MED ORDER — MAGNESIUM SULFATE 2 GM/50ML IV SOLN
2.0000 g | Freq: Once | INTRAVENOUS | Status: AC
  Administered 2024-05-07: 2 g via INTRAVENOUS
  Filled 2024-05-06: qty 50

## 2024-05-06 MED ORDER — FLUTICASONE PROPIONATE 50 MCG/ACT NA SUSP: 1.0000 | Freq: Every day | NASAL | Status: DC

## 2024-05-06 MED ORDER — SODIUM CHLORIDE 0.9% FLUSH
3.0000 mL | Freq: Two times a day (BID) | INTRAVENOUS | Status: DC
  Administered 2024-05-07 – 2024-05-09 (×5): 3 mL via INTRAVENOUS

## 2024-05-06 MED ORDER — ROSUVASTATIN CALCIUM 20 MG PO TABS
20.0000 mg | ORAL_TABLET | Freq: Every day | ORAL | Status: DC
  Administered 2024-05-07 – 2024-05-09 (×3): 20 mg via ORAL
  Filled 2024-05-06 (×3): qty 1

## 2024-05-06 MED ORDER — LACTATED RINGERS IV SOLN: INTRAVENOUS | Status: AC

## 2024-05-06 MED ORDER — ONDANSETRON HCL 4 MG PO TABS: 4.0000 mg | ORAL_TABLET | Freq: Four times a day (QID) | ORAL | Status: DC | PRN

## 2024-05-06 MED ORDER — FAMOTIDINE IN NACL 20-0.9 MG/50ML-% IV SOLN
20.0000 mg | Freq: Once | INTRAVENOUS | Status: AC
  Administered 2024-05-06: 20 mg via INTRAVENOUS
  Filled 2024-05-06: qty 50

## 2024-05-06 MED ORDER — LORAZEPAM 2 MG/ML IJ SOLN
1.0000 mg | Freq: Once | INTRAMUSCULAR | Status: AC
  Administered 2024-05-06: 1 mg via INTRAVENOUS
  Filled 2024-05-06: qty 1

## 2024-05-06 MED ORDER — CEFTRIAXONE SODIUM 1 G IJ SOLR
1.0000 g | Freq: Once | INTRAMUSCULAR | Status: AC
  Administered 2024-05-06: 1 g via INTRAVENOUS
  Filled 2024-05-06: qty 10

## 2024-05-06 MED ORDER — DULOXETINE HCL 30 MG PO CPEP
30.0000 mg | ORAL_CAPSULE | Freq: Two times a day (BID) | ORAL | Status: DC
  Administered 2024-05-07 – 2024-05-09 (×5): 30 mg via ORAL
  Filled 2024-05-06 (×5): qty 1

## 2024-05-06 MED ORDER — METOPROLOL TARTRATE 25 MG PO TABS
25.0000 mg | ORAL_TABLET | Freq: Once | ORAL | Status: DC
  Filled 2024-05-06: qty 1

## 2024-05-06 MED ORDER — VITAMIN B-12 1000 MCG PO TABS
1000.0000 ug | ORAL_TABLET | Freq: Every day | ORAL | Status: DC
  Administered 2024-05-07 – 2024-05-09 (×3): 1000 ug via ORAL
  Filled 2024-05-06 (×3): qty 1

## 2024-05-06 MED ORDER — ACETAMINOPHEN 325 MG PO TABS
650.0000 mg | ORAL_TABLET | Freq: Four times a day (QID) | ORAL | Status: DC | PRN
  Administered 2024-05-07 (×2): 650 mg via ORAL
  Filled 2024-05-06 (×2): qty 2

## 2024-05-06 MED ORDER — SODIUM CHLORIDE 0.9 % IV SOLN
2.0000 g | Freq: Once | INTRAVENOUS | Status: AC
  Administered 2024-05-07: 2 g via INTRAVENOUS
  Filled 2024-05-06: qty 12.5

## 2024-05-06 MED ORDER — BUDESON-GLYCOPYRROL-FORMOTEROL 160-9-4.8 MCG/ACT IN AERO
2.0000 | INHALATION_SPRAY | Freq: Two times a day (BID) | RESPIRATORY_TRACT | Status: DC
  Administered 2024-05-07 – 2024-05-09 (×4): 2 via RESPIRATORY_TRACT
  Filled 2024-05-06 (×2): qty 5.9

## 2024-05-06 MED ORDER — MELATONIN 5 MG PO TABS
5.0000 mg | ORAL_TABLET | Freq: Every day | ORAL | Status: DC
  Administered 2024-05-07 – 2024-05-08 (×3): 5 mg via ORAL
  Filled 2024-05-06 (×3): qty 1

## 2024-05-06 MED ORDER — POTASSIUM CHLORIDE ER 10 MEQ PO TBCR
40.0000 meq | EXTENDED_RELEASE_TABLET | Freq: Once | ORAL | Status: AC
  Administered 2024-05-07: 40 meq via ORAL
  Filled 2024-05-06: qty 4

## 2024-05-06 MED ORDER — LEVALBUTEROL HCL 0.63 MG/3ML IN NEBU: 0.6300 mg | INHALATION_SOLUTION | Freq: Four times a day (QID) | RESPIRATORY_TRACT | Status: DC | PRN

## 2024-05-06 MED ORDER — PANTOPRAZOLE SODIUM 40 MG PO TBEC
40.0000 mg | DELAYED_RELEASE_TABLET | Freq: Every day | ORAL | Status: DC
Start: 2024-05-07 — End: 2024-05-09
  Administered 2024-05-07 – 2024-05-09 (×3): 40 mg via ORAL
  Filled 2024-05-06 (×3): qty 1

## 2024-05-06 MED ORDER — VANCOMYCIN HCL IN DEXTROSE 1-5 GM/200ML-% IV SOLN
1000.0000 mg | Freq: Once | INTRAVENOUS | Status: AC
  Administered 2024-05-07: 1000 mg via INTRAVENOUS
  Filled 2024-05-06: qty 200

## 2024-05-06 MED ORDER — SODIUM CHLORIDE 0.9 % IV BOLUS
1000.0000 mL | Freq: Once | INTRAVENOUS | Status: AC
  Administered 2024-05-06: 1000 mL via INTRAVENOUS

## 2024-05-06 MED ORDER — IOHEXOL 350 MG/ML SOLN
75.0000 mL | Freq: Once | INTRAVENOUS | Status: AC | PRN
  Administered 2024-05-06: 75 mL via INTRAVENOUS

## 2024-05-06 NOTE — H&P (Signed)
 History and Physical    Betty Golden FMW:[id number] DOB: 1945/08/08 DOA: 05/06/2024  PCP: Irven Ozell DEL, MD   Patient coming from: Home   Chief Complaint:  Chief Complaint  Patient presents with   Palpitations   Shortness of Breath   Chest Pain   ED TRIAGE note: Pt complains of chest pain and heart palpitations started yesterday. Pain radiates down left arm.             HPI:  Betty Golden is a 78 y.o. female with medical history significant of CVA in 03/2024, chronic CHF, rheumatoid arthritis, fibromyalgia, essential hypertension, COPD .Presented to emergency department multiple complaints include palpitation, chest pain, dyspnea.  Patient reported not feeling well for last 2 days.  Reported mid substernal chest pain with palpitation, exertional dyspnea.  Pain is sharp and stabbing quality worsening with exertion.  Patient unable to ambulate very short distance due to significant dyspnea which is new to per patient's family. Patient denies any fever, chill, nausea, vomiting, UTI symptoms URI symptoms.  Family reported patient is not eating or drinking well.  Patient has been recently discharged from rehab from recent admission from CVA.  During my potation at bedside patient is stating that she developed chest pain with coughing and shortness of breath.  ED Course:  At presentation to ED patient found tachycardic heart rate 122, tachypneic 31 and borderline hypertensive.  O2 sat 100% room air.  Afebrile.  EKG showing sinus tachycardia heart rate 124. Lab work, BMP shows low potassium 3.3, low bicarb 18, elevated creatinine 1.19, elevated anion gap 17 and GFR 47. CBC showing leukocytosis 18.3 otherwise unremarkable. Flat troponin 38 and 36. Blood cultures are in process. Low mag 1.6. Normal TSH and T4. Elevated lactic acid 2 improved to 1.9. VBG unremarkable.  Pending respiratory panel.  CTA chest no evidence of pulmonary embolism.  Prominent emphysematous  changes of the lung.  Multifocal pneumonia.  Aortic atherosclerosis.  Chest x-ray showing no acute disease process.  In the ED patient received aspirin  324 mg in the setting of chest pain, cefepime, ceftriaxone , Ativan , 2 L of LR bolus, vancomycin  and famotidine .   Hospitalist has been consulted for further evaluation management of sepsis in the setting of community-acquired pneumonia, pleuritic chest pain due to pneumonia, elevated troponin secondary to demand ischemia, hypokalemia, hypomagnesemia and acute kidney injury.  Significant labs in the ED: Lab Orders         Blood culture (routine x 2)         Resp panel by RT-PCR (RSV, Flu A&B, Covid) Anterior Nasal Swab         Expectorated Sputum Assessment w Gram Stain, Rflx to Resp Cult         Basic metabolic panel         CBC         Urinalysis, Routine w reflex microscopic -Urine, Clean Catch         Magnesium          TSH         T4, free         Protime-INR         Legionella Pneumophila Serogp 1 Ur Ag         Strep pneumoniae urinary antigen         Procalcitonin         Comprehensive metabolic panel         CBC         I-Stat CG4  Lactic Acid         I-Stat venous blood gas, ([facility] ED, [facility], [facility])         I-Stat Lactic Acid, ED       Review of Systems:  ROS  Past Medical History:  Diagnosis Date   Asthma    C. difficile colitis    Cervical stenosis of spine    Chronic headaches    Chronic neck pain    Coronary artery disease    Depression    Diverticulosis    Essential hypertension    Fibromyalgia    GERD (gastroesophageal reflux disease)    History of Salmonella gastroenteritis    HNP (herniated nucleus pulposus), lumbar    Hyperlipidemia    IBS (irritable bowel syndrome)    Lung nodule    rheumatoid arthritis     Past Surgical History:  Procedure Laterality Date   APPENDECTOMY  1965   BIOPSY  08/26/2018   Procedure: BIOPSY;  Surgeon: Golda Claudis PENNER, MD;  [facility]: [facility];  Service: Endoscopy;;   gastric   BREAST BIOPSY     BREAST REDUCTION SURGERY  1994   CATARACT EXTRACTION W/PHACO Left 06/09/2023   Procedure: CATARACT EXTRACTION PHACO AND INTRAOCULAR LENS PLACEMENT (IOC);  Surgeon: Harrie Agent, MD;  Location: [facility];  Service: Ophthalmology;  Laterality: Left;  CDE 7.03   CATARACT EXTRACTION W/PHACO Right 06/23/2023   Procedure: CATARACT EXTRACTION PHACO AND INTRAOCULAR LENS PLACEMENT (IOC);  Surgeon: Harrie Agent, MD;  Location: [facility];  Service: Ophthalmology;  Laterality: Right;  CDE: 11.55   CHOLECYSTECTOMY  1975   COLONOSCOPY N/A 08/19/2013   Procedure: COLONOSCOPY;  Surgeon: Claudis PENNER Golda, MD;  Location: [facility];  Service: Endoscopy;  Laterality: N/A;  155-moved to 140 Ann to notify pt   COLONOSCOPY N/A 08/26/2018   Procedure: COLONOSCOPY;  Surgeon: Golda Claudis PENNER, MD;  Location: [facility];  Service: Endoscopy;  Laterality: N/A;   ESOPHAGOGASTRODUODENOSCOPY N/A 08/26/2018   Procedure: ESOPHAGOGASTRODUODENOSCOPY (EGD);  Surgeon: Golda Claudis PENNER, MD;  Location: [facility];  Service: Endoscopy;  Laterality: N/A;   fibromyalgia     LUMBAR LAMINECTOMY/DECOMPRESSION MICRODISCECTOMY Bilateral 09/20/2015   Procedure: MICRO LUMBAR DECOMPRESSION L5-S1 BILATERAL    (1 LEVEL);  Surgeon: Reyes Billing, MD;  Location: [facility];  Service: Orthopedics;  Laterality: Bilateral;   rheumatoid arthritis     Sinus Surgery     TONSILLECTOMY  1968     reports that she quit smoking about 35 years ago. Her smoking use included cigarettes. She started smoking about 45 years ago. She has a 20 pack-year smoking history. She has never used smokeless tobacco. She reports that she does not drink alcohol and does not use drugs.  Allergies  Allergen Reactions   Hydromorphone  Anaphylaxis   Sulfa Antibiotics Rash    Family History  Problem Relation Age of Onset   Heart disease Mother    Hypertension Mother    Asthma Mother    Heart disease Father    Stroke Father    Hypertension  Father    Stroke Sister    Asthma Sister    Hypertension Brother    Asthma Brother    Asthma Maternal Grandmother    Heart disease Maternal Grandmother    Diabetes Maternal Grandfather    Asthma Maternal Aunt    Colon cancer Neg Hx     Prior to Admission medications   Medication Sig Start Date End Date Taking? Authorizing Provider  acetaminophen  (TYLENOL ) 325  MG tablet Take 1-2 tablets (325-650 mg total) by mouth every 4 (four) hours as needed for mild pain (pain score 1-3). 04/19/24   Love, Sharlet RAMAN, PA-C  albuterol  (PROAIR  HFA) 108 (90 Base) MCG/ACT inhaler Inhale 2 puffs into the lungs every 4 (four) hours as needed for wheezing or shortness of breath. 04/16/23   Iva Marty Saltness, MD  amLODipine  (NORVASC ) 10 MG tablet Take 1 tablet (10 mg total) by mouth daily. 04/28/24   Love, Sharlet RAMAN, PA-C  aspirin  EC 81 MG tablet Take 1 tablet (81 mg total) by mouth daily. Swallow whole. 04/28/24 08/06/24  Love, Sharlet RAMAN, PA-C  benralizumab  (FASENRA ) 30 MG/ML prefilled syringe Inject 1 mL (30 mg total) into the skin every 8 (eight) weeks. 07/31/23   Iva Marty Saltness, MD  Certolizumab Pegol (CIMZIA [place]) Inject 1 Dose into the skin every 14 (fourteen) days.    [provider]  clindamycin (CLEOCIN T) 1 % lotion Apply 1 Application topically daily.    [provider]  clopidogrel  (PLAVIX ) 75 MG tablet Take 1 tablet (75 mg total) by mouth daily. 04/28/24 07/27/24  Love, Sharlet RAMAN, PA-C  DULoxetine  (CYMBALTA ) 30 MG capsule Take 1 capsule (30 mg total) by mouth 2 (two) times daily. 04/28/24   Love, Sharlet RAMAN, PA-C  Ferrous Sulfate (IRON PO) Take 65 mg by mouth daily.    [provider]  fluticasone  (FLONASE ) 50 MCG/ACT nasal spray SPRAY 2 SPRAYS INTO EACH NOSTRIL EVERY DAY 10/15/23   Iva Marty Saltness, MD  Fluticasone -Umeclidin-Vilant (TRELEGY ELLIPTA ) 200-62.5-25 MCG/ACT AEPB Inhale 1 puff into the lungs in the morning. 10/15/23   Iva Marty Saltness, MD  leflunomide  (ARAVA ) 20  MG tablet Take 20 mg by mouth daily.    [provider]  melatonin 5 MG TABS Take 1 tablet (5 mg total) by mouth at bedtime. 04/28/24   Love, Sharlet RAMAN, PA-C  metoprolol  succinate (TOPROL  XL) 25 MG 24 hr tablet Take 1 tablet (25 mg total) by mouth daily. 04/28/24   Love, Sharlet RAMAN, PA-C  Multiple Vitamin (MULTIVITAMIN PO) Take 1 tablet by mouth daily at 6 (six) AM.    [provider]  pantoprazole  (PROTONIX ) 40 MG tablet Take 1 tablet (40 mg total) by mouth daily. 04/28/24   Love, Sharlet RAMAN, PA-C  potassium chloride  (KLOR-CON  M) 10 MEQ tablet Take 3 tablets (30 mEq total) by mouth daily. 04/29/24   Love, Sharlet RAMAN, PA-C  potassium chloride  (KLOR-CON ) 10 MEQ tablet Take 10 mEq by mouth 3 (three) times daily. 05/04/24   [provider]  RESTASIS  0.05 % ophthalmic emulsion Place 1 drop into both eyes 2 (two) times daily. 04/01/23   [provider]  rosuvastatin  (CRESTOR ) 20 MG tablet Take 1 tablet (20 mg total) by mouth daily. 04/28/24   Love, Sharlet RAMAN, PA-C  traZODone  (DESYREL ) 50 MG tablet Take 0.5 tablets (25 mg total) by mouth at bedtime. 04/28/24   Love, Sharlet RAMAN, PA-C  vitamin B-12 (CYANOCOBALAMIN ) 1000 MCG tablet Take 1 tablet (1,000 mcg total) by mouth daily. 10/24/21   Neomi Johnston DASEN, PA-C     Physical Exam: Vitals:   05/06/24 2022 05/06/24 2100 05/06/24 2145 05/06/24 2200  BP:  (!) 164/81 (!) 167/114 (!) 165/115  Pulse:  (!) 124 (!) 119 (!) 122  Resp:  (!) 31 (!) 26 (!) 22  Temp: 98.8 F (37.1 C)     SpO2:  100% 100% 100%  Weight:        Physical Exam  Labs on Admission: I have personally reviewed following labs and imaging studies  CBC: Recent Labs  Lab 05/06/24 1741 05/06/24 2023  WBC 18.3*  --   HGB 12.9 12.6  HCT 38.3 37.0  MCV 84.4  --   PLT 213  --    Basic Metabolic Panel: Recent Labs  Lab 05/06/24 1741 05/06/24 2010 05/06/24 2023  NA 138  --  142  K 3.3*  --  2.8*  CL 103  --   --   CO2 18*  --   --   GLUCOSE 119*  --   --    BUN 11  --   --   CREATININE 1.19*  --   --   CALCIUM  9.6  --   --   MG  --  1.6*  --    GFR: Estimated Creatinine Clearance: 37.5 mL/min (A) (by C-G formula based on SCr of 1.19 mg/dL (H)). Liver Function Tests: No results for input(s): AST, ALT, ALKPHOS, BILITOT, PROT, ALBUMIN in the last 168 hours. No results for input(s): LIPASE, AMYLASE in the last 168 hours. No results for input(s): AMMONIA in the last 168 hours. Coagulation Profile: No results for input(s): INR, PROTIME in the last 168 hours. Cardiac Enzymes: Recent Labs  Lab 05/06/24 1741 05/06/24 2010  TROPONINIHS 38* 36*   BNP (last 3 results) No results for input(s): BNP in the last 8760 hours. HbA1C: No results for input(s): HGBA1C in the last 72 hours. CBG: No results for input(s): GLUCAP in the last 168 hours. Lipid Profile: No results for input(s): CHOL, HDL, LDLCALC, TRIG, CHOLHDL, LDLDIRECT in the last 72 hours. Thyroid Function Tests: Recent Labs    05/06/24 2010  TSH 2.627  FREET4 1.12   Anemia Panel: No results for input(s): VITAMINB12, FOLATE, FERRITIN, TIBC, IRON, RETICCTPCT in the last 72 hours. Urine analysis:    Component Value Date/Time   COLORURINE YELLOW 04/25/2024 0831   APPEARANCEUR HAZY (A) 04/25/2024 0831   APPEARANCEUR Clear 01/18/2022 1311   LABSPEC 1.012 04/25/2024 0831   PHURINE 5.0 04/25/2024 0831   GLUCOSEU NEGATIVE 04/25/2024 0831   HGBUR NEGATIVE 04/25/2024 0831   BILIRUBINUR NEGATIVE 04/25/2024 0831   BILIRUBINUR Negative 01/18/2022 1311   KETONESUR 20 (A) 04/25/2024 0831   PROTEINUR NEGATIVE 04/25/2024 0831   UROBILINOGEN 0.2 03/09/2015 1035   NITRITE NEGATIVE 04/25/2024 0831   LEUKOCYTESUR NEGATIVE 04/25/2024 0831    Radiological Exams on Admission: I have personally reviewed images CT Angio Chest PE W and/or Wo Contrast Result Date: 05/06/2024 CLINICAL DATA:  Pulmonary embolus suspected with high probability.  Chest pain radiating to the left arm. History of cardiac issues. EXAM: CT ANGIOGRAPHY CHEST WITH CONTRAST TECHNIQUE: Multidetector CT imaging of the chest was performed using the standard protocol during bolus administration of intravenous contrast. Multiplanar CT image reconstructions and MIPs were obtained to evaluate the vascular anatomy. RADIATION DOSE REDUCTION: This exam was performed according to the departmental dose-optimization program which includes automated exposure control, adjustment of the mA and/or kV according to patient size and/or use of iterative reconstruction technique. CONTRAST:  75mL OMNIPAQUE  IOHEXOL  350 MG/ML SOLN COMPARISON:  Chest radiograph 05/06/2024.  CT chest 09/25/2021 FINDINGS: Cardiovascular: Technically adequate study with good opacification of the central and segmental pulmonary arteries. Mild motion artifact. No focal filling defects. No evidence of significant pulmonary embolus. Normal caliber thoracic aorta. No aortic dissection. Normal heart size. No pericardial effusions. Mild scattered aortic calcification. Mediastinum/Nodes: Esophagus is decompressed. No significant lymphadenopathy. Thyroid gland is unremarkable. Lungs/Pleura:  Diffuse emphysematous changes throughout the lungs. Patchy ground-glass infiltrates bilaterally likely representing multifocal pneumonia or possibly edema. No pleural effusion or pneumothorax. Upper Abdomen: No acute abnormality. Musculoskeletal: Degenerative changes in the spine and shoulders. No acute bony abnormalities. Review of the MIP images confirms the above findings. IMPRESSION: 1. No evidence of significant pulmonary embolus. 2. Prominent emphysematous changes throughout the lungs. 3. Patchy ground-glass infiltrates in the lungs likely represent multifocal pneumonia. 4. Mild aortic atherosclerosis. Electronically Signed   By: Elsie Gravely M.D.   On: 05/06/2024 22:51   DG Chest Portable 1 View Result Date: 05/06/2024 EXAM: 1 VIEW  XRAY OF THE CHEST 05/06/2024 07:54:00 PM COMPARISON: None available. CLINICAL HISTORY: dib, cap?. CP, SOB and palpitations. Recent stroke. FINDINGS: LUNGS AND PLEURA: No focal pulmonary opacity. No pulmonary edema. No pleural effusion. No pneumothorax. HEART AND MEDIASTINUM: No acute abnormality of the cardiac and mediastinal silhouettes. BONES AND SOFT TISSUES: Osseous structures are age appropriate. IMPRESSION: 1. No acute process. Electronically signed by: Dorethia Molt MD 05/06/2024 07:58 PM EDT RP Workstation: [id number]     EKG: My personal interpretation of EKG shows: ***    Assessment/Plan: Principal Problem:   Sepsis due to pneumonia [facility]) Active Problems:   Acute kidney injury superimposed on chronic kidney disease   Elevated troponin   Rheumatoid arthritis (HCC)   Essential hypertension   Hypokalemia   Hypomagnesemia   Pleuritic chest pain   History of CVA (cerebrovascular accident)   History of COPD   Sepsis due to Klebsiella pneumoniae (HCC)    Assessment and Plan: Sepsis secondary to pneumonia History of chronic dyspnea History of COPD  -Presented emergency department with complaining of orthopnea, dyspnea, chest pain with deep breathing, cough and not feeling well for last few days.  Family reported patient also has poor oral intake. -At presentation to ED patient found tachycardic, tachypneic, borderline hypertensive.  Afebrile.   O2 sat 100% room air.  Elevated lactic acid.  CBC showing leukocytosis. -Chest x-ray unremarkable - CTA chest ruled out PE however it shows multifocal pneumonia. - Patient meets sepsis criteria - In the ED patient received 1 L of NS bolus and lactic acid has been improved - Currently on maintenance fluid LR 150 cc/h - - Given patient being admitted 1 month ago for acute CVA high risk for for development of Marcet Pseudomonas associated pneumonia - Continue IV vancomycin  as cefepime with pharmacy consult.  Need to follow-up with blood  culture, sputum culture, urine Legionella urine strep test.  Pending procalcitonin and respiratory panel. -Continue supportive care  Elevated troponin secondary to demand ischemia -Flat troponin 38 and 36.  EKG showed normal sinus rhythm there is no history of abnormality.  Elevated troponin in this context of demand ischemia from sepsis and sinus tachycardia. - Obtaining echocardiogram to assess for wall motion abnormality. - In the ED patient received aspirin  load in the setting of elevated troponin however as there is no concern for ACS there is no indication to continue aspirin . - If echocardiogram shows any wall motion abnormality need to consult cardiology for further evaluation. -Continue cardiac monitoring  History of chronic sinus tachycardia History of essential hypertension History of chronic diastolic heart failure -Persistent sinus tachycardia heart rate upper 120s range.  Patient missed dose of Toprol -XL today. - Continue Toprol -XL 25 mg daily.  Hypokalemia Hypomagnesemia - Repleted with oral KCl and IV mag sulfate.   Nonpleuritic chest pain -Patient reported feeling sensation of chest pain with cough and deep breathing.  Concern for  nonpleuritic chest pain in the setting of pneumonia.  At this time there is no concern for acute coronary syndrome.  EKG reassuring.  Flat troponin.  Acute kidney injury CKD stage III A -Elevated creatinine 1.19.  And low GFR 47.  Prerenal acute kidney injury in the setting of sepsis. - Continue maintenance fluid LR.  Monitor urine output.  Avoid neurotoxic agent.  Monitor renal function.  History of rheumatoid arthritis -Hold leflunomide  in the setting of acute sepsis from pneumonia.  History of recent CVA -Patient completed 3 weeks course of aspirin  and Plavix  currently supposed to be on Plavix  75 mg daily. - Continue Plavix  75 mg daily, Crestor  20 mg daily.  Insomnia  Generalized anxiety disorder - Continue Cymbalta  twice  daily   DVT prophylaxis:  SQ Heparin  Code Status:  Full Code Diet:  Family Communication:  *** Family was present at bedside, at the time of interview.  Opportunity was given to ask question and all questions were answered satisfactorily.  Disposition Plan: Pending echocardiogram.  Need to follow-up with respiratory panel, blood culture, sputum culture procalcitonin level urine Legionella urine strep test. Consults: None indicated at this time Admission status:   Inpatient, Telemetry bed  Severity of Illness: The appropriate patient status for this patient is INPATIENT. Inpatient status is judged to be reasonable and necessary in order to provide the required intensity of service to ensure the patient's safety. The patient's presenting symptoms, physical exam findings, and initial radiographic and laboratory data in the context of their chronic comorbidities is felt to place them at high risk for further clinical deterioration. Furthermore, it is not anticipated that the patient will be medically stable for discharge from the hospital within 2 midnights of admission.   * I certify that at the point of admission it is my clinical judgment that the patient will require inpatient hospital care spanning beyond 2 midnights from the point of admission due to high intensity of service, high risk for further deterioration and high frequency of surveillance required.DEWAINE    Dayshon Roback, MD [facility]  How to contact the [facility] Attending or Consulting provider 7A - 7P or covering provider during after hours 7P -7A, for this patient.  Check the care team in [facility] and look for a) attending/consulting [facility] provider listed and b) the [facility] team listed Log into [email or web address] and use [place]'s universal password to access. If you do not have the password, please contact the hospital operator. Locate the [facility] provider you are looking for under [facility] and page to a number that you can be  directly reached. If you still have difficulty reaching the provider, please page the [facility] (Director on Call) for the Hospitalists listed on amion for assistance.  05/06/2024, 11:55 PM

## 2024-05-06 NOTE — ED Triage Notes (Signed)
 Pt complains of chest pain and heart palpitations started yesterday. Pain radiates down left arm.

## 2024-05-06 NOTE — ED Provider Notes (Signed)
 [place] EMERGENCY DEPARTMENT AT [facility] Provider Note  CSN: [id number] Arrival date & time: 05/06/24 1702  Chief Complaint(s) Palpitations, Shortness of Breath, and Chest Pain  HPI Betty Golden is a 78 y.o. female with past medical history as below, significant for asthma, hypertension, fibromyalgia, hyperlipidemia, MCA stroke, spinal stenosis, COPD who presents to the ED with complaint of palpitations, chest pain, dyspnea  Pt reports feeling unwell x2 days, midsternal CP with palpitations, exertional dyspnea. Pain is sharp /stabbing, worsened with exertion. Pt unable to ambulate very short distances without significant dyspnea which is new per family. Denies fevers, chills, n/v, family does report she does not drink liquids like she is supposed to, denies change to bowel/bladder function. No recent medication or diet changes. Was recently discharged home from rehab following recent admission for MCA CVA  Limited history from pt 2/2 prior cva, aphasia.  Family at the bedside helps with history  Past Medical History Past Medical History:  Diagnosis Date   Asthma    C. difficile colitis    Cervical stenosis of spine    Chronic headaches    Chronic neck pain    Coronary artery disease    Depression    Diverticulosis    Essential hypertension    Fibromyalgia    GERD (gastroesophageal reflux disease)    History of Salmonella gastroenteritis    HNP (herniated nucleus pulposus), lumbar    Hyperlipidemia    IBS (irritable bowel syndrome)    Lung nodule    rheumatoid arthritis    Patient Active Problem List   Diagnosis Date Noted   Diverticulitis of colon 04/29/2024   Acute ischemic left middle cerebral artery (MCA) stroke (HCC) 04/17/2024   Acute metabolic encephalopathy 04/12/2024   Dizziness 02/27/2024   Dyslipidemia 02/27/2024   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   Fecal incontinence 02/06/2023   Malnutrition of moderate degree 09/27/2021   Acute  respiratory failure with hypoxia (HCC)    Acute urinary retention 09/26/2021   Aspiration pneumonia (HCC) 09/26/2021   RA (rheumatoid arthritis) (HCC) 09/25/2021   Leukocytosis 09/25/2021   Viral pneumonia 04/20/2019   TIA (transient ischemic attack) 04/16/2019   Dyspnea 04/04/2019   COVID-19 virus infection 04/04/2019   Gastritis and gastroduodenitis 07/08/2018   Rectal bleeding 06/29/2018   Fibromyalgia 09/08/2017   Spinal stenosis of lumbar region 09/20/2015   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/03/2015   Moderate persistent asthma 07/03/2015   Allergic rhinitis 07/03/2015   Colitis due to Clostridium difficile 02/10/2015   Abdominal pain 02/10/2015   Nausea and vomiting 02/10/2015   AKI (acute kidney injury) 02/10/2015   Dehydration 02/10/2015   Hypoxia 02/10/2015   Hyponatremia 02/10/2015   Hypokalemia 02/10/2015   COPD (chronic obstructive pulmonary disease) (HCC)    Constipation 07/15/2013   DYSPNEA 04/05/2008   Essential hypertension 03/10/2008   Headache 03/10/2008   Cough 03/10/2008   Home Medication(s) Prior to Admission medications   Medication Sig Start Date End Date Taking? Authorizing Provider  acetaminophen  (TYLENOL ) 325 MG tablet Take 1-2 tablets (325-650 mg total) by mouth every 4 (four) hours as needed for mild pain (pain score 1-3). 04/19/24   Love, Sharlet RAMAN, PA-C  albuterol  (PROAIR  HFA) 108 (90 Base) MCG/ACT inhaler Inhale 2 puffs into the lungs every 4 (four) hours as needed for wheezing or shortness of breath. 04/16/23   Iva Marty Saltness, MD  amLODipine  (NORVASC ) 10 MG tablet Take 1 tablet (10 mg total) by mouth daily. 04/28/24   Maurice Sharlet RAMAN, PA-C  aspirin   EC 81 MG tablet Take 1 tablet (81 mg total) by mouth daily. Swallow whole. 04/28/24 08/06/24  Love, Sharlet RAMAN, PA-C  benralizumab  (FASENRA ) 30 MG/ML prefilled syringe Inject 1 mL (30 mg total) into the skin every 8 (eight) weeks. 07/31/23   Iva Marty Saltness, MD  Certolizumab Pegol (CIMZIA [place]) Inject 1 Dose into the  skin every 14 (fourteen) days.    [provider]  clindamycin (CLEOCIN T) 1 % lotion Apply 1 Application topically daily.    [provider]  clopidogrel  (PLAVIX ) 75 MG tablet Take 1 tablet (75 mg total) by mouth daily. 04/28/24 07/27/24  Love, Sharlet RAMAN, PA-C  DULoxetine  (CYMBALTA ) 30 MG capsule Take 1 capsule (30 mg total) by mouth 2 (two) times daily. 04/28/24   Love, Sharlet RAMAN, PA-C  Ferrous Sulfate (IRON PO) Take 65 mg by mouth daily.    [provider]  fluticasone  (FLONASE ) 50 MCG/ACT nasal spray SPRAY 2 SPRAYS INTO EACH NOSTRIL EVERY DAY 10/15/23   Iva Marty Saltness, MD  Fluticasone -Umeclidin-Vilant (TRELEGY ELLIPTA ) 200-62.5-25 MCG/ACT AEPB Inhale 1 puff into the lungs in the morning. 10/15/23   Iva Marty Saltness, MD  leflunomide  (ARAVA ) 20 MG tablet Take 20 mg by mouth daily.    [provider]  melatonin 5 MG TABS Take 1 tablet (5 mg total) by mouth at bedtime. 04/28/24   Love, Sharlet RAMAN, PA-C  metoprolol  succinate (TOPROL  XL) 25 MG 24 hr tablet Take 1 tablet (25 mg total) by mouth daily. 04/28/24   Love, Sharlet RAMAN, PA-C  Multiple Vitamin (MULTIVITAMIN PO) Take 1 tablet by mouth daily at 6 (six) AM.    [provider]  pantoprazole  (PROTONIX ) 40 MG tablet Take 1 tablet (40 mg total) by mouth daily. 04/28/24   Love, Sharlet RAMAN, PA-C  potassium chloride  (KLOR-CON  M) 10 MEQ tablet Take 3 tablets (30 mEq total) by mouth daily. 04/29/24   Love, Sharlet RAMAN, PA-C  potassium chloride  (KLOR-CON ) 10 MEQ tablet Take 10 mEq by mouth 3 (three) times daily. 05/04/24   [provider]  RESTASIS  0.05 % ophthalmic emulsion Place 1 drop into both eyes 2 (two) times daily. 04/01/23   [provider]  rosuvastatin  (CRESTOR ) 20 MG tablet Take 1 tablet (20 mg total) by mouth daily. 04/28/24   Love, Sharlet RAMAN, PA-C  traZODone  (DESYREL ) 50 MG tablet Take 0.5 tablets (25 mg total) by mouth at bedtime. 04/28/24   Love, Sharlet RAMAN, PA-C  vitamin B-12 (CYANOCOBALAMIN )  1000 MCG tablet Take 1 tablet (1,000 mcg total) by mouth daily. 10/24/21   Neomi Johnston ONEIDA DEVONNA                                                                                                                                    Past Surgical History Past Surgical History:  Procedure Laterality Date   APPENDECTOMY  1965   BIOPSY  08/26/2018   Procedure: BIOPSY;  Surgeon: Golda Claudis PENNER, MD;  Location: [facility];  Service: Endoscopy;;  gastric   BREAST BIOPSY     BREAST REDUCTION SURGERY  1994   CATARACT EXTRACTION W/PHACO Left 06/09/2023   Procedure: CATARACT EXTRACTION PHACO AND INTRAOCULAR LENS PLACEMENT (IOC);  Surgeon: Harrie Agent, MD;  Location: [facility];  Service: Ophthalmology;  Laterality: Left;  CDE 7.03   CATARACT EXTRACTION W/PHACO Right 06/23/2023   Procedure: CATARACT EXTRACTION PHACO AND INTRAOCULAR LENS PLACEMENT (IOC);  Surgeon: Harrie Agent, MD;  Location: [facility];  Service: Ophthalmology;  Laterality: Right;  CDE: 11.55   CHOLECYSTECTOMY  1975   COLONOSCOPY N/A 08/19/2013   Procedure: COLONOSCOPY;  Surgeon: Claudis PENNER Golda, MD;  Location: [facility];  Service: Endoscopy;  Laterality: N/A;  155-moved to 140 Ann to notify pt   COLONOSCOPY N/A 08/26/2018   Procedure: COLONOSCOPY;  Surgeon: Golda Claudis PENNER, MD;  Location: [facility];  Service: Endoscopy;  Laterality: N/A;   ESOPHAGOGASTRODUODENOSCOPY N/A 08/26/2018   Procedure: ESOPHAGOGASTRODUODENOSCOPY (EGD);  Surgeon: Golda Claudis PENNER, MD;  Location: [facility];  Service: Endoscopy;  Laterality: N/A;   fibromyalgia     LUMBAR LAMINECTOMY/DECOMPRESSION MICRODISCECTOMY Bilateral 09/20/2015   Procedure: MICRO LUMBAR DECOMPRESSION L5-S1 BILATERAL    (1 LEVEL);  Surgeon: Reyes Billing, MD;  Location: [facility];  Service: Orthopedics;  Laterality: Bilateral;   rheumatoid arthritis     Sinus Surgery     TONSILLECTOMY  1968   Family History Family History  Problem Relation Age of Onset   Heart disease Mother     Hypertension Mother    Asthma Mother    Heart disease Father    Stroke Father    Hypertension Father    Stroke Sister    Asthma Sister    Hypertension Brother    Asthma Brother    Asthma Maternal Grandmother    Heart disease Maternal Grandmother    Diabetes Maternal Grandfather    Asthma Maternal Aunt    Colon cancer Neg Hx     Social History Social History   Tobacco Use   Smoking status: Former    Current packs/day: 0.00    Average packs/day: 2.0 packs/day for 10.0 years (20.0 ttl pk-yrs)    Types: Cigarettes    Start date: 09/11/1978    Quit date: 09/11/1988    Years since quitting: 35.6   Smokeless tobacco: Never   Tobacco comments:    quit 1990  Vaping Use   Vaping status: Never Used  Substance Use Topics   Alcohol use: No    Comment: stopped in 1990   Drug use: No    Comment: hx smoking marijuana and cocaine weekends. Stopped in the 1990s.   Allergies Hydromorphone  and Sulfa antibiotics  Review of Systems A thorough review of systems was obtained and all systems are negative except as noted in the HPI and PMH.   Physical Exam Vital Signs  I have reviewed the triage vital signs BP (!) 165/115   Pulse (!) 122   Temp 98.8 F (37.1 C)   Resp (!) 22   Wt 69 kg   SpO2 100%   BMI 26.95 kg/m  Physical Exam Vitals and nursing note reviewed.  Constitutional:      General: She is not in acute distress.    Appearance: Normal appearance.  HENT:     Head: Normocephalic and atraumatic.     Right Ear: External ear normal.     Left Ear: External ear normal.  Nose: Nose normal.     Mouth/Throat:     Mouth: Mucous membranes are moist.  Eyes:     General: No scleral icterus.       Right eye: No discharge.        Left eye: No discharge.  Cardiovascular:     Rate and Rhythm: Normal rate and regular rhythm.     Pulses: Normal pulses.     Heart sounds: Normal heart sounds.  Pulmonary:     Effort: Pulmonary effort is normal. Tachypnea present. No respiratory  distress.     Breath sounds: Normal breath sounds. No stridor.  Abdominal:     General: Abdomen is flat. There is no distension.     Palpations: Abdomen is soft.     Tenderness: There is no abdominal tenderness.  Musculoskeletal:     Cervical back: No rigidity.     Right lower leg: No edema.     Left lower leg: No edema.  Skin:    General: Skin is warm and dry.     Capillary Refill: Capillary refill takes less than 2 seconds.  Neurological:     Mental Status: She is alert.  Psychiatric:        Mood and Affect: Mood normal.        Behavior: Behavior normal. Behavior is cooperative.     ED Results and Treatments Labs (all labs ordered are listed, but only abnormal results are displayed) Labs Reviewed  BASIC METABOLIC PANEL WITH GFR - Abnormal; Notable for the following components:      Result Value   Potassium 3.3 (*)    CO2 18 (*)    Glucose, Bld 119 (*)    Creatinine, Ser 1.19 (*)    GFR, Estimated 47 (*)    Anion gap 17 (*)    All other components within normal limits  CBC - Abnormal; Notable for the following components:   WBC 18.3 (*)    All other components within normal limits  MAGNESIUM  - Abnormal; Notable for the following components:   Magnesium  1.6 (*)    All other components within normal limits  I-STAT CG4 LACTIC ACID, ED - Abnormal; Notable for the following components:   Lactic Acid, Venous 2.0 (*)    All other components within normal limits  I-STAT VENOUS BLOOD GAS, ED - Abnormal; Notable for the following components:   pCO2, Ven 38.3 (*)    Potassium 2.8 (*)    Calcium , Ion 0.90 (*)    All other components within normal limits  TROPONIN I (HIGH SENSITIVITY) - Abnormal; Notable for the following components:   Troponin I (High Sensitivity) 38 (*)    All other components within normal limits  TROPONIN I (HIGH SENSITIVITY) - Abnormal; Notable for the following components:   Troponin I (High Sensitivity) 36 (*)    All other components within normal  limits  CULTURE, BLOOD (ROUTINE X 2)  CULTURE, BLOOD (ROUTINE X 2)  RESP PANEL BY RT-PCR (RSV, FLU A&B, COVID)  RVPGX2  TSH  T4, FREE  URINALYSIS, ROUTINE W REFLEX MICROSCOPIC  PROTIME-INR  I-STAT CG4 LACTIC ACID, ED  I-STAT CG4 LACTIC ACID, ED  Radiology CT Angio Chest PE W and/or Wo Contrast Result Date: 05/06/2024 CLINICAL DATA:  Pulmonary embolus suspected with high probability. Chest pain radiating to the left arm. History of cardiac issues. EXAM: CT ANGIOGRAPHY CHEST WITH CONTRAST TECHNIQUE: Multidetector CT imaging of the chest was performed using the standard protocol during bolus administration of intravenous contrast. Multiplanar CT image reconstructions and MIPs were obtained to evaluate the vascular anatomy. RADIATION DOSE REDUCTION: This exam was performed according to the departmental dose-optimization program which includes automated exposure control, adjustment of the mA and/or kV according to patient size and/or use of iterative reconstruction technique. CONTRAST:  75mL OMNIPAQUE  IOHEXOL  350 MG/ML SOLN COMPARISON:  Chest radiograph 05/06/2024.  CT chest 09/25/2021 FINDINGS: Cardiovascular: Technically adequate study with good opacification of the central and segmental pulmonary arteries. Mild motion artifact. No focal filling defects. No evidence of significant pulmonary embolus. Normal caliber thoracic aorta. No aortic dissection. Normal heart size. No pericardial effusions. Mild scattered aortic calcification. Mediastinum/Nodes: Esophagus is decompressed. No significant lymphadenopathy. Thyroid gland is unremarkable. Lungs/Pleura: Diffuse emphysematous changes throughout the lungs. Patchy ground-glass infiltrates bilaterally likely representing multifocal pneumonia or possibly edema. No pleural effusion or pneumothorax. Upper Abdomen: No acute abnormality.  Musculoskeletal: Degenerative changes in the spine and shoulders. No acute bony abnormalities. Review of the MIP images confirms the above findings. IMPRESSION: 1. No evidence of significant pulmonary embolus. 2. Prominent emphysematous changes throughout the lungs. 3. Patchy ground-glass infiltrates in the lungs likely represent multifocal pneumonia. 4. Mild aortic atherosclerosis. Electronically Signed   By: Elsie Gravely M.D.   On: 05/06/2024 22:51   DG Chest Portable 1 View Result Date: 05/06/2024 EXAM: 1 VIEW XRAY OF THE CHEST 05/06/2024 07:54:00 PM COMPARISON: None available. CLINICAL HISTORY: dib, cap?. CP, SOB and palpitations. Recent stroke. FINDINGS: LUNGS AND PLEURA: No focal pulmonary opacity. No pulmonary edema. No pleural effusion. No pneumothorax. HEART AND MEDIASTINUM: No acute abnormality of the cardiac and mediastinal silhouettes. BONES AND SOFT TISSUES: Osseous structures are age appropriate. IMPRESSION: 1. No acute process. Electronically signed by: Dorethia Molt MD 05/06/2024 07:58 PM EDT RP Workstation: [id number]    Pertinent labs & imaging results that were available during my care of the patient were reviewed by me and considered in my medical decision making (see MDM for details).  Medications Ordered in ED Medications  vancomycin  (VANCOCIN ) IVPB 1000 mg/200 mL premix (has no administration in time range)  ceFEPIme (MAXIPIME) 2 g in sodium chloride  0.9 % 100 mL IVPB (has no administration in time range)  magnesium  sulfate IVPB 2 g 50 mL (has no administration in time range)  sodium chloride  0.9 % bolus 1,000 mL (has no administration in time range)  lactated ringers  infusion (has no administration in time range)  potassium chloride  (KLOR-CON ) CR tablet 40 mEq (has no administration in time range)  metoprolol  tartrate (LOPRESSOR ) tablet 25 mg (has no administration in time range)  cefTRIAXone  (ROCEPHIN ) 1 g in sodium chloride  0.9 % 100 mL IVPB (0 g Intravenous Stopped  05/06/24 2143)  sodium chloride  0.9 % bolus 1,000 mL (1,000 mLs Intravenous New Bag/Given 05/06/24 2014)  famotidine  (PEPCID ) IVPB 20 mg premix (0 mg Intravenous Stopped 05/06/24 2215)  aspirin  chewable tablet 324 mg (324 mg Oral Given 05/06/24 1957)  LORazepam  (ATIVAN ) injection 1 mg (1 mg Intravenous Given 05/06/24 2039)  LORazepam  (ATIVAN ) injection 2 mg (2 mg Intravenous Given 05/06/24 2223)  iohexol  (OMNIPAQUE ) 350 MG/ML injection 75 mL (75 mLs Intravenous Contrast Given 05/06/24 2241)  Procedures .Critical Care  Performed by: Elnor Jayson LABOR, DO Authorized by: Elnor Jayson LABOR, DO   Critical care provider statement:    Critical care time (minutes):  60   Critical care time was exclusive of:  Separately billable procedures and treating other patients   Critical care was necessary to treat or prevent imminent or life-threatening deterioration of the following conditions:  Sepsis and cardiac failure   Critical care was time spent personally by me on the following activities:  Development of treatment plan with patient or surrogate, discussions with consultants, evaluation of patient's response to treatment, examination of patient, ordering and review of laboratory studies, ordering and review of radiographic studies, ordering and performing treatments and interventions, pulse oximetry, re-evaluation of patient's condition, review of old charts and obtaining history from patient or surrogate   Care discussed with: admitting provider     (including critical care time)  Medical Decision Making / ED Course    Medical Decision Making:    AMARIYAH BAZAR is a 78 y.o. female with past medical history as below, significant for asthma, hypertension, fibromyalgia, hyperlipidemia, MCA stroke, spinal stenosis, COPD who presents to the ED with complaint of palpitations, chest  pain, dyspnea. The complaint involves an extensive differential diagnosis and also carries with it a high risk of complications and morbidity.  Serious etiology was considered. Ddx includes but is not limited to: Differential includes all life-threatening causes for chest pain. This includes but is not exclusive to acute coronary syndrome, aortic dissection, pulmonary embolism, cardiac tamponade, community-acquired pneumonia, pericarditis, musculoskeletal chest wall pain, arrhythmia, pneumonia, pulmonary embolism, pneumothorax, pulmonary edema, metabolic acidosis, asthma, COPD, cardiac cause, anemia, anxiety, etc.    Complete initial physical exam performed, notably the patient was in no acute distress, tachycardia noted, blood pressure stable    Reviewed and confirmed nursing documentation for past medical history, family history, social history.  Vital signs reviewed.    Chest pain Palpitations Exertional dyspnea> - Troponin elevated at 38, she is having ongoing chest pain, EKG is nonischemic, sinus tachycardia - Leukocytosis noted, patient denies fever or chills, does have occasional cough.  Cover empirically with Rocephin  for possible pneumonia - During rehab patient was able to ambulate approximate 193 feet, family reports over the past 2 days her ambulation has become significant.  Secondary to exertional dyspnea - Dyspnea, palpitations likely secondary to sepsis, pneumonia -No sig improvement to patient's heart rate following IV fluids, her chest pain has improved however.  CTA without PE.  Patient is unsure if she took her metoprolol  this morning.  Will provide - Troponins mildly elevated, downtrending, EKG without acute ischemic changes.  Likely demand ischemia secondary to sepsis, tachycardia  Multifocal pneumonia Sepsis> -Patient found to have multifocal pneumonia on imaging, she meets criteria for sepsis with leukocytosis, tachycardia and tachypnea.  She does not appear to be in septic  shock.  She is hemodynamically stable.  She is breathing breathing comfortably on ambient air. - Patient was recently hospitalized secondary to CVA, was recent discharged to rehab.  She is at elevated risk for drug-resistant bacteria.  I ordered vancomycin  and cefepime.  She received Rocephin  early in care  Electrolyte derangement> - Magnesium  potassium are low, replaced     Admit [facility]               Additional history obtained: -Additional history obtained from family -External records from outside source obtained and reviewed including: Chart review including previous notes, labs, imaging, consultation notes including  Recent  admission, rehab documentation, allergy list   Lab Tests: -I ordered, reviewed, and interpreted labs.   The pertinent results include:   Labs Reviewed  BASIC METABOLIC PANEL WITH GFR - Abnormal; Notable for the following components:      Result Value   Potassium 3.3 (*)    CO2 18 (*)    Glucose, Bld 119 (*)    Creatinine, Ser 1.19 (*)    GFR, Estimated 47 (*)    Anion gap 17 (*)    All other components within normal limits  CBC - Abnormal; Notable for the following components:   WBC 18.3 (*)    All other components within normal limits  MAGNESIUM  - Abnormal; Notable for the following components:   Magnesium  1.6 (*)    All other components within normal limits  I-STAT CG4 LACTIC ACID, ED - Abnormal; Notable for the following components:   Lactic Acid, Venous 2.0 (*)    All other components within normal limits  I-STAT VENOUS BLOOD GAS, ED - Abnormal; Notable for the following components:   pCO2, Ven 38.3 (*)    Potassium 2.8 (*)    Calcium , Ion 0.90 (*)    All other components within normal limits  TROPONIN I (HIGH SENSITIVITY) - Abnormal; Notable for the following components:   Troponin I (High Sensitivity) 38 (*)    All other components within normal limits  TROPONIN I (HIGH SENSITIVITY) - Abnormal; Notable for the following  components:   Troponin I (High Sensitivity) 36 (*)    All other components within normal limits  CULTURE, BLOOD (ROUTINE X 2)  CULTURE, BLOOD (ROUTINE X 2)  RESP PANEL BY RT-PCR (RSV, FLU A&B, COVID)  RVPGX2  TSH  T4, FREE  URINALYSIS, ROUTINE W REFLEX MICROSCOPIC  PROTIME-INR  I-STAT CG4 LACTIC ACID, ED  I-STAT CG4 LACTIC ACID, ED    Notable for as above  EKG   EKG Interpretation Date/Time:    Ventricular Rate:    PR Interval:    QRS Duration:    QT Interval:    QTC Calculation:   R Axis:      Text Interpretation:           Imaging Studies ordered: I ordered imaging studies including CT PE, chest x-ray I independently visualized the following imaging with scope of interpretation limited to determining acute life threatening conditions related to emergency care; findings noted above I agree with the radiologist interpretation If any imaging was obtained with contrast I closely monitored patient for any possible adverse reaction a/w contrast administration in the emergency department   Medicines ordered and prescription drug management: Meds ordered this encounter  Medications   cefTRIAXone  (ROCEPHIN ) 1 g in sodium chloride  0.9 % 100 mL IVPB    Antibiotic Indication::   CAP   sodium chloride  0.9 % bolus 1,000 mL   famotidine  (PEPCID ) IVPB 20 mg premix   aspirin  chewable tablet 324 mg   LORazepam  (ATIVAN ) injection 1 mg   DISCONTD: LORazepam  (ATIVAN ) injection 1 mg   LORazepam  (ATIVAN ) injection 2 mg   iohexol  (OMNIPAQUE ) 350 MG/ML injection 75 mL   vancomycin  (VANCOCIN ) IVPB 1000 mg/200 mL premix    Indication::   Other Indication (list below)    Other Indication::   HCAP   ceFEPIme (MAXIPIME) 2 g in sodium chloride  0.9 % 100 mL IVPB    Antibiotic Indication::   HCAP   magnesium  sulfate IVPB 2 g 50 mL   sodium chloride  0.9 % bolus 1,000 mL  lactated ringers  infusion   potassium chloride  (KLOR-CON ) CR tablet 40 mEq   metoprolol  tartrate (LOPRESSOR ) tablet 25  mg    -I have reviewed the patients home medicines and have made adjustments as needed   Consultations Obtained: I requested consultation with the hospitalist,  and discussed lab and imaging findings as well as pertinent plan   Cardiac Monitoring: The patient was maintained on a cardiac monitor.  I personally viewed and interpreted the cardiac monitored which showed an underlying rhythm of: Sinus tachycardia Continuous pulse oximetry interpreted by myself, 100% on RA.    Social Determinants of Health:  Diagnosis or treatment significantly limited by social determinants of health: former smoker   Reevaluation: After the interventions noted above, I reevaluated the patient and found that they have improved  Co morbidities that complicate the patient evaluation  Past Medical History:  Diagnosis Date   Asthma    C. difficile colitis    Cervical stenosis of spine    Chronic headaches    Chronic neck pain    Coronary artery disease    Depression    Diverticulosis    Essential hypertension    Fibromyalgia    GERD (gastroesophageal reflux disease)    History of Salmonella gastroenteritis    HNP (herniated nucleus pulposus), lumbar    Hyperlipidemia    IBS (irritable bowel syndrome)    Lung nodule    rheumatoid arthritis       Dispostion: Disposition decision including need for hospitalization was considered, and patient was admitted    Final Clinical Impression(s) / ED Diagnoses Final diagnoses:  Multifocal pneumonia  Sepsis, due to unspecified organism, unspecified whether acute organ dysfunction present (HCC)  Hypomagnesemia  Hypokalemia  Sinus tachycardia        Elnor Jayson LABOR, DO 05/06/24 2320

## 2024-05-06 NOTE — Sepsis Progress Note (Signed)
 Elink monitoring for the code sepsis protocol.

## 2024-05-06 NOTE — ED Provider Triage Note (Signed)
 Emergency Medicine Provider Triage Evaluation Note  Betty Golden , a 78 y.o. female  was evaluated in triage.  Pt complains of chest pain radiating to left arm.  History of cardiac issues.  Recent strokes.  Review of Systems  Positive: As above Negative: As above  Physical Exam  BP (!) 137/104   Pulse (!) 132   Temp 97.6 F (36.4 C)   Resp (!) 32   Wt 69 kg   SpO2 100%   BMI 26.95 kg/m  Gen:   Awake, no distress   Resp:  Normal effort  MSK:   Moves extremities without difficulty  Other:    Medical Decision Making  Medically screening exam initiated at 6:50 PM.  Appropriate orders placed.  Betty Golden was informed that the remainder of the evaluation will be completed by another provider, this initial triage assessment does not replace that evaluation, and the importance of remaining in the ED until their evaluation is complete.  CBC with leukocytosis, troponin elevated above baseline, and anion gap present.  Nurse notified that patient needs room soon.  She is also tachycardic.   Hildegard Loge, PA-C 05/06/24 1850

## 2024-05-07 ENCOUNTER — Inpatient Hospital Stay (HOSPITAL_COMMUNITY)

## 2024-05-07 DIAGNOSIS — R7989 Other specified abnormal findings of blood chemistry: Secondary | ICD-10-CM | POA: Diagnosis not present

## 2024-05-07 DIAGNOSIS — A419 Sepsis, unspecified organism: Secondary | ICD-10-CM | POA: Diagnosis not present

## 2024-05-07 DIAGNOSIS — R079 Chest pain, unspecified: Secondary | ICD-10-CM | POA: Diagnosis not present

## 2024-05-07 DIAGNOSIS — J189 Pneumonia, unspecified organism: Secondary | ICD-10-CM | POA: Diagnosis not present

## 2024-05-07 LAB — URINALYSIS, ROUTINE W REFLEX MICROSCOPIC
Bilirubin Urine: NEGATIVE
Glucose, UA: NEGATIVE mg/dL
Ketones, ur: NEGATIVE mg/dL
Nitrite: NEGATIVE
Protein, ur: NEGATIVE mg/dL
Specific Gravity, Urine: 1.031 — ABNORMAL HIGH (ref 1.005–1.030)
pH: 6 (ref 5.0–8.0)

## 2024-05-07 LAB — RESPIRATORY PANEL BY PCR

## 2024-05-07 LAB — COMPREHENSIVE METABOLIC PANEL WITH GFR
ALT: 19 U/L (ref 0–44)
AST: 24 U/L (ref 15–41)
Albumin: 3.3 g/dL — ABNORMAL LOW (ref 3.5–5.0)
Alkaline Phosphatase: 42 U/L (ref 38–126)
Anion gap: 11 (ref 5–15)
BUN: 11 mg/dL (ref 8–23)
CO2: 19 mmol/L — ABNORMAL LOW (ref 22–32)
Calcium: 8.4 mg/dL — ABNORMAL LOW (ref 8.9–10.3)
Chloride: 110 mmol/L (ref 98–111)
Creatinine, Ser: 1.05 mg/dL — ABNORMAL HIGH (ref 0.44–1.00)
GFR, Estimated: 55 mL/min — ABNORMAL LOW (ref >60)
Glucose, Bld: 113 mg/dL — ABNORMAL HIGH (ref 70–99)
Potassium: 3.3 mmol/L — ABNORMAL LOW (ref 3.5–5.1)
Sodium: 140 mmol/L (ref 135–145)
Total Bilirubin: 0.6 mg/dL (ref 0.0–1.2)
Total Protein: 6.1 g/dL — ABNORMAL LOW (ref 6.5–8.1)

## 2024-05-07 LAB — CBC
HCT: 35.8 % — ABNORMAL LOW (ref 36.0–46.0)
Hemoglobin: 11.8 g/dL — ABNORMAL LOW (ref 12.0–15.0)
MCH: 28.4 pg (ref 26.0–34.0)
MCHC: 33 g/dL (ref 30.0–36.0)
MCV: 86.3 fL (ref 80.0–100.0)
Platelets: 182 K/uL (ref 150–400)
RBC: 4.15 MIL/uL (ref 3.87–5.11)
RDW: 13.5 % (ref 11.5–15.5)
WBC: 14.2 K/uL — ABNORMAL HIGH (ref 4.0–10.5)
nRBC: 0 % (ref 0.0–0.2)

## 2024-05-07 LAB — ECHOCARDIOGRAM COMPLETE
Calc EF: 61.9 %
MV VTI: 2.07 cm2
S' Lateral: 2.1 cm
Single Plane A2C EF: 62.6 %
Single Plane A4C EF: 58.1 %
Weight: 2433.88 [oz_av]

## 2024-05-07 LAB — STREP PNEUMONIAE URINARY ANTIGEN: Strep Pneumo Urinary Antigen: NEGATIVE

## 2024-05-07 LAB — RESP PANEL BY RT-PCR (RSV, FLU A&B, COVID)  RVPGX2
Influenza A by PCR: NEGATIVE
Influenza B by PCR: NEGATIVE
Resp Syncytial Virus by PCR: NEGATIVE
SARS Coronavirus 2 by RT PCR: NEGATIVE

## 2024-05-07 LAB — PROTIME-INR
INR: 0.9 (ref 0.8–1.2)
Prothrombin Time: 13 s (ref 11.4–15.2)

## 2024-05-07 LAB — PROCALCITONIN: Procalcitonin: <0.1 ng/mL

## 2024-05-07 LAB — MRSA NEXT GEN BY PCR, NASAL: MRSA by PCR Next Gen: NOT DETECTED

## 2024-05-07 MED ORDER — SODIUM CHLORIDE 0.9 % IV SOLN
2.0000 g | Freq: Two times a day (BID) | INTRAVENOUS | Status: DC
  Administered 2024-05-07 – 2024-05-08 (×3): 2 g via INTRAVENOUS
  Filled 2024-05-07 (×3): qty 12.5

## 2024-05-07 MED ORDER — ASPIRIN 81 MG PO TBEC
81.0000 mg | DELAYED_RELEASE_TABLET | Freq: Every day | ORAL | Status: DC
  Administered 2024-05-07 – 2024-05-09 (×3): 81 mg via ORAL
  Filled 2024-05-07 (×3): qty 1

## 2024-05-07 MED ORDER — METOPROLOL SUCCINATE ER 25 MG PO TB24
25.0000 mg | ORAL_TABLET | Freq: Every day | ORAL | Status: DC
Start: 2024-05-07 — End: 2024-05-09
  Administered 2024-05-07 – 2024-05-09 (×4): 25 mg via ORAL
  Filled 2024-05-07 (×4): qty 1

## 2024-05-07 MED ORDER — IPRATROPIUM BROMIDE 0.02 % IN SOLN: 0.5000 mg | RESPIRATORY_TRACT | Status: DC | PRN

## 2024-05-07 MED ORDER — ALUM & MAG HYDROXIDE-SIMETH 200-200-20 MG/5ML PO SUSP
30.0000 mL | Freq: Four times a day (QID) | ORAL | Status: DC | PRN
Start: 2024-05-07 — End: 2024-05-09
  Administered 2024-05-08 – 2024-05-09 (×2): 30 mL via ORAL
  Filled 2024-05-07 (×2): qty 30

## 2024-05-07 MED ORDER — VANCOMYCIN HCL 750 MG/150ML IV SOLN
750.0000 mg | INTRAVENOUS | Status: DC
  Administered 2024-05-07: 750 mg via INTRAVENOUS
  Filled 2024-05-07 (×2): qty 150

## 2024-05-07 MED ORDER — POTASSIUM CHLORIDE ER 10 MEQ PO TBCR
40.0000 meq | EXTENDED_RELEASE_TABLET | ORAL | Status: AC
  Administered 2024-05-07 (×2): 40 meq via ORAL
  Filled 2024-05-07 (×2): qty 4

## 2024-05-07 NOTE — Progress Notes (Signed)
 PROGRESS NOTE    Betty Golden  FMW:[id number] DOB: 1946-05-10 DOA: 05/06/2024 PCP: Irven Ozell DEL, MD   Brief Narrative:  Betty Golden is a 78 y.o. female with medical history significant of CVA in 03/2024, chronic CHF, rheumatoid arthritis, fibromyalgia, essential hypertension, COPD .Presented to emergency department multiple complaints include palpitation, chest pain, cough and shortness of breath for last 2 days.  Patient denies any fever, chill, nausea, vomiting, UTI symptoms URI symptoms.  Patient has been recently discharged from rehab from recent admission from CVA.  In the ER, patient was tachycardic, tachypneic, febrile with leukocytosis, diagnosed with pneumonia, met sepsis criteria, had electrolyte abnormalities, received antibiotics and IV fluids and admitted to hospital service.  Assessment & Plan:   Principal Problem:   Sepsis due to pneumonia [facility]) Active Problems:   Acute kidney injury superimposed on chronic kidney disease   Elevated troponin   Rheumatoid arthritis (HCC)   Essential hypertension   Hypokalemia   Hypomagnesemia   Pleuritic chest pain   History of CVA (cerebrovascular accident)   History of COPD   Sepsis due to Klebsiella pneumoniae (HCC)  History of COPD here with sepsis secondary to healthcare associated pneumonia, POA: Met sepsis criteria at this time tachycardia, tachypnea, fever, leukocytosis and lactic acid of 2, meets criteria for healthcare associated pneumonia due to recent 5-day hospitalization with an last month. Chest x-ray unremarkable however  CTA chest ruled out PE however it shows multifocal pneumonia.  Has been started on vancomycin  and cefepime, urine antigen for streptococci negative, Legionella pending.  Follow cultures.  Procalcitonin unremarkable, some suspicion of possible viral pneumonia.  Has been tested negative for COVID, influenza and RSV but will check respiratory viral panel.   Chest pain with elevated troponin,  POA: -Flat troponin 38 and 36.  EKG showed normal sinus rhythm there is no history of abnormality.  Elevated troponin presumably secondary to demand ischemia in the setting of sepsis however echo is pending to rule out wall motion abnormality.  Monitor on telemetry.     History of COPD Stable.  Continue ipratropium as needed and Breztri  twice daily   History of chronic sinus tachycardia/ essential hypertension/ chronic diastolic heart failure Recent echo shows normal ejection fraction and grade 1 diastolic dysfunction.  Persistent sinus tachycardia heart rate upper 120s range.  Patient missed dose of Toprol -X. Continue Toprol -XL 25 mg daily and treat underlying sepsis.   GERD Continue oral Maalox and oral Protonix .   Hypokalemia: Low again, will replenish.  Monitor on telemetry.  Hypomagnesemia - Repleted with oral KCl and IV mag sulfate.   Acute kidney injury ruled out, CKD stage III A ruled in: Baseline creatinine around 1.1, presented with elevated creatinine 1.19, does not meet criteria for AKI.  This is almost baseline creatinine.   History of rheumatoid arthritis -Hold leflunomide  in the setting of acute sepsis from pneumonia.   History of recent CVA -Patient completed 3 weeks course of aspirin  and Plavix  currently supposed to be on Plavix  75 mg daily. - Continue Plavix  75 mg daily, Crestor  20 mg daily.   Generalized anxiety disorder - Continue Cymbalta  twice daily  DVT prophylaxis: heparin  injection 5,000 Units Start: 05/07/24 0600 SCDs Start: 05/06/24 2356 Place TED hose Start: 05/06/24 2356   Code Status: Full Code  Family Communication:  None present at bedside.  Plan of care discussed with patient in length and he/she verbalized understanding and agreed with it.  Status is: Inpatient Remains inpatient appropriate because: Resolving sepsis  Estimated body mass index is 26.95 kg/m as calculated from the following:   Height as of 04/17/24: 5' 3 (1.6 m).   Weight as  of this encounter: 69 kg.    Nutritional Assessment: Body mass index is 26.95 kg/m.Betty Golden Seen by dietician.  I agree with the assessment and plan as outlined below: Nutrition Status:        . Skin Assessment: I have examined the patient's skin and I agree with the wound assessment as performed by the wound care RN as outlined below:    Consultants:  None  Procedures:  None  Antimicrobials:  Anti-infectives (From admission, onward)    Start     Dose/Rate Route Frequency Ordered Stop   05/07/24 2000  vancomycin  (VANCOREADY) IVPB 750 mg/150 mL        750 mg 150 mL/hr over 60 Minutes Intravenous Every 24 hours 05/07/24 0024     05/07/24 1200  ceFEPIme (MAXIPIME) 2 g in sodium chloride  0.9 % 100 mL IVPB        2 g 200 mL/hr over 30 Minutes Intravenous Every 12 hours 05/07/24 0024     05/06/24 2315  vancomycin  (VANCOCIN ) IVPB 1000 mg/200 mL premix        1,000 mg 200 mL/hr over 60 Minutes Intravenous  Once 05/06/24 2304 05/07/24 0328   05/06/24 2315  ceFEPIme (MAXIPIME) 2 g in sodium chloride  0.9 % 100 mL IVPB        2 g 200 mL/hr over 30 Minutes Intravenous  Once 05/06/24 2304 05/07/24 0123   05/06/24 1900  cefTRIAXone  (ROCEPHIN ) 1 g in sodium chloride  0.9 % 100 mL IVPB        1 g 200 mL/hr over 30 Minutes Intravenous  Once 05/06/24 1856 05/06/24 2143         Subjective: Patient seen and examined, she says that she is feeling much better today.  She had no specific complaints.  Objective: Vitals:   05/07/24 0330 05/07/24 0415 05/07/24 0500 05/07/24 0600  BP: 136/73 (!) 147/133 (!) 150/87 (!) 153/73  Pulse: (!) 106 (!) 104 98 (!) 110  Resp: (!) 21 19 (!) 26 (!) 23  Temp:    (!) 102.3 F (39.1 C)  TempSrc:      SpO2: 97% 96% 97% 99%  Weight:        Intake/Output Summary (Last 24 hours) at 05/07/2024 0744 Last data filed at 05/07/2024 0123 Gross per 24 hour  Intake 246.01 ml  Output --  Net 246.01 ml   Filed Weights   05/06/24 1721  Weight: 69 kg     Examination:  General exam: Appears calm and comfortable  Respiratory system: Clear to auscultation. Respiratory effort normal. Cardiovascular system: S1 & S2 heard, RRR. No JVD, murmurs, rubs, gallops or clicks. No pedal edema. Gastrointestinal system: Abdomen is nondistended, soft and nontender. No organomegaly or masses felt. Normal bowel sounds heard. Central nervous system: Alert and oriented. No focal neurological deficits. Extremities: Symmetric 5 x 5 power. Skin: No rashes, lesions or ulcers Psychiatry: Judgement and insight appear normal. Mood & affect appropriate.    Data Reviewed: I have personally reviewed following labs and imaging studies  CBC: Recent Labs  Lab 05/06/24 1741 05/06/24 2023 05/07/24 0237  WBC 18.3*  --  14.2*  HGB 12.9 12.6 11.8*  HCT 38.3 37.0 35.8*  MCV 84.4  --  86.3  PLT 213  --  182   Basic Metabolic Panel: Recent Labs  Lab 05/06/24 1741 05/06/24 2010  05/06/24 2023 05/07/24 0237  NA 138  --  142 140  K 3.3*  --  2.8* 3.3*  CL 103  --   --  110  CO2 18*  --   --  19*  GLUCOSE 119*  --   --  113*  BUN 11  --   --  11  CREATININE 1.19*  --   --  1.05*  CALCIUM  9.6  --   --  8.4*  MG  --  1.6*  --   --    GFR: Estimated Creatinine Clearance: 42.5 mL/min (A) (by C-G formula based on SCr of 1.05 mg/dL (H)). Liver Function Tests: Recent Labs  Lab 05/07/24 0237  AST 24  ALT 19  ALKPHOS 42  BILITOT 0.6  PROT 6.1*  ALBUMIN 3.3*   No results for input(s): LIPASE, AMYLASE in the last 168 hours. No results for input(s): AMMONIA in the last 168 hours. Coagulation Profile: Recent Labs  Lab 05/07/24 0020  INR 0.9   Cardiac Enzymes: No results for input(s): CKTOTAL, CKMB, CKMBINDEX, TROPONINI in the last 168 hours. BNP (last 3 results) No results for input(s): PROBNP in the last 8760 hours. HbA1C: No results for input(s): HGBA1C in the last 72 hours. CBG: No results for input(s): GLUCAP in the last 168  hours. Lipid Profile: No results for input(s): CHOL, HDL, LDLCALC, TRIG, CHOLHDL, LDLDIRECT in the last 72 hours. Thyroid Function Tests: Recent Labs    05/06/24 2010  TSH 2.627  FREET4 1.12   Anemia Panel: No results for input(s): VITAMINB12, FOLATE, FERRITIN, TIBC, IRON, RETICCTPCT in the last 72 hours. Sepsis Labs: Recent Labs  Lab 05/06/24 2025 05/06/24 2231 05/07/24 0020  PROCALCITON  --   --  <0.10  LATICACIDVEN 2.0* 1.9  --     Recent Results (from the past 240 hours)  Resp panel by RT-PCR (RSV, Flu A&B, Covid) Urine, Clean Catch     Status: None   Collection Time: 05/07/24 12:20 AM   Specimen: Urine, Clean Catch; Nasal Swab  Result Value Ref Range Status   SARS Coronavirus 2 by RT PCR NEGATIVE NEGATIVE Final   Influenza A by PCR NEGATIVE NEGATIVE Final   Influenza B by PCR NEGATIVE NEGATIVE Final    Comment: (NOTE) The Xpert Xpress SARS-CoV-2/FLU/RSV plus assay is intended as an aid in the diagnosis of influenza from Nasopharyngeal swab specimens and should not be used as a sole basis for treatment. Nasal washings and aspirates are unacceptable for Xpert Xpress SARS-CoV-2/FLU/RSV testing.  Fact Sheet for Patients: BloggerCourse.com  Fact Sheet for Healthcare Providers: SeriousBroker.it  This test is not yet approved or cleared by the [place]  FDA and has been authorized for detection and/or diagnosis of SARS-CoV-2 by FDA under an Emergency Use Authorization (EUA). This EUA will remain in effect (meaning this test can be used) for the duration of the COVID-19 declaration under Section 564(b)(1) of the Act, 21 U.S.C. section 360bbb-3(b)(1), unless the authorization is terminated or revoked.     Resp Syncytial Virus by PCR NEGATIVE NEGATIVE Final    Comment: (NOTE) Fact Sheet for Patients: BloggerCourse.com  Fact Sheet for Healthcare  Providers: SeriousBroker.it  This test is not yet approved or cleared by the [place]  FDA and has been authorized for detection and/or diagnosis of SARS-CoV-2 by FDA under an Emergency Use Authorization (EUA). This EUA will remain in effect (meaning this test can be used) for the duration of the COVID-19 declaration under Section 564(b)(1) of the Act, 21  U.S.C. section 360bbb-3(b)(1), unless the authorization is terminated or revoked.  Performed at [facility], [address], [place], [place] [address]      Radiology Studies: CT Angio Chest PE W and/or Wo Contrast Result Date: 05/06/2024 CLINICAL DATA:  Pulmonary embolus suspected with high probability. Chest pain radiating to the left arm. History of cardiac issues. EXAM: CT ANGIOGRAPHY CHEST WITH CONTRAST TECHNIQUE: Multidetector CT imaging of the chest was performed using the standard protocol during bolus administration of intravenous contrast. Multiplanar CT image reconstructions and MIPs were obtained to evaluate the vascular anatomy. RADIATION DOSE REDUCTION: This exam was performed according to the departmental dose-optimization program which includes automated exposure control, adjustment of the mA and/or kV according to patient size and/or use of iterative reconstruction technique. CONTRAST:  75mL OMNIPAQUE  IOHEXOL  350 MG/ML SOLN COMPARISON:  Chest radiograph 05/06/2024.  CT chest 09/25/2021 FINDINGS: Cardiovascular: Technically adequate study with good opacification of the central and segmental pulmonary arteries. Mild motion artifact. No focal filling defects. No evidence of significant pulmonary embolus. Normal caliber thoracic aorta. No aortic dissection. Normal heart size. No pericardial effusions. Mild scattered aortic calcification. Mediastinum/Nodes: Esophagus is decompressed. No significant lymphadenopathy. Thyroid gland is unremarkable. Lungs/Pleura: Diffuse emphysematous changes throughout  the lungs. Patchy ground-glass infiltrates bilaterally likely representing multifocal pneumonia or possibly edema. No pleural effusion or pneumothorax. Upper Abdomen: No acute abnormality. Musculoskeletal: Degenerative changes in the spine and shoulders. No acute bony abnormalities. Review of the MIP images confirms the above findings. IMPRESSION: 1. No evidence of significant pulmonary embolus. 2. Prominent emphysematous changes throughout the lungs. 3. Patchy ground-glass infiltrates in the lungs likely represent multifocal pneumonia. 4. Mild aortic atherosclerosis. Electronically Signed   By: Elsie Gravely M.D.   On: 05/06/2024 22:51   DG Chest Portable 1 View Result Date: 05/06/2024 EXAM: 1 VIEW XRAY OF THE CHEST 05/06/2024 07:54:00 PM COMPARISON: None available. CLINICAL HISTORY: dib, cap?. CP, SOB and palpitations. Recent stroke. FINDINGS: LUNGS AND PLEURA: No focal pulmonary opacity. No pulmonary edema. No pleural effusion. No pneumothorax. HEART AND MEDIASTINUM: No acute abnormality of the cardiac and mediastinal silhouettes. BONES AND SOFT TISSUES: Osseous structures are age appropriate. IMPRESSION: 1. No acute process. Electronically signed by: Dorethia Molt MD 05/06/2024 07:58 PM EDT RP Workstation: [id number]    Scheduled Meds:  benralizumab   30 mg Subcutaneous Q8 Weeks   budesonide -glycopyrrolate -formoterol   2 puff Inhalation BID   clopidogrel   75 mg Oral Daily   cyanocobalamin   1,000 mcg Oral Daily   DULoxetine   30 mg Oral BID   ferrous sulfate  325 mg Oral Daily   heparin   5,000 Units Subcutaneous Q8H   melatonin  5 mg Oral QHS   metoprolol  succinate  25 mg Oral Daily   pantoprazole   40 mg Oral Daily   potassium chloride   40 mEq Oral Q4H   rosuvastatin   20 mg Oral Daily   sodium chloride  flush  3 mL Intravenous Q12H   sodium chloride  flush  3 mL Intravenous Q12H   traZODone   25 mg Oral QHS   Continuous Infusions:  sodium chloride      ceFEPime (MAXIPIME) IV     lactated  ringers  150 mL/hr at 05/07/24 0226   vancomycin        LOS: 1 day   Fredia Skeeter, MD [facility]  05/07/2024, 7:44 AM   *Please note that this is a verbal dictation therefore any spelling or grammatical errors are due to the Dragon Medical One system interpretation.  Please page via Amion  and do not message via secure chat for urgent patient care matters. Secure chat can be used for non urgent patient care matters.  How to contact the [facility] Attending or Consulting provider 7A - 7P or covering provider during after hours 7P -7A, for this patient?  Check the care team in [facility] and look for a) attending/consulting [facility] provider listed and b) the [facility] team listed. Page or secure chat 7A-7P. Log into [email or web address] and use [place]'s universal password to access. If you do not have the password, please contact the hospital operator. Locate the [facility] provider you are looking for under [facility] and page to a number that you can be directly reached. If you still have difficulty reaching the provider, please page the [facility] (Director on Call) for the Hospitalists listed on amion for assistance.

## 2024-05-07 NOTE — Progress Notes (Signed)
  Echocardiogram 2D Echocardiogram has been performed.  Betty Golden 05/07/2024, 8:28 AM

## 2024-05-07 NOTE — ED Notes (Signed)
 Notified floor patient is coming up

## 2024-05-07 NOTE — Progress Notes (Signed)
 Pharmacy Antibiotic Note  Betty Golden is a 79 y.o. female admitted on 05/06/2024 with pneumonia and sepsis.  Pharmacy has been consulted for vancomycin  and cefepime dosing.  Plan: Vancomycin  1000mg  ordered in ED; continue with 750mg  IV Q. Goal AUC 400-550.  Expected AUC 470. Cefepime 2g IV Q12H.  Weight: 69 kg (152 lb 1.9 oz)  Temp (24hrs), Avg:98.2 F (36.8 C), Min:97.6 F (36.4 C), Max:98.8 F (37.1 C)  Recent Labs  Lab 05/06/24 1741 05/06/24 2025 05/06/24 2231  WBC 18.3*  --   --   CREATININE 1.19*  --   --   LATICACIDVEN  --  2.0* 1.9    Estimated Creatinine Clearance: 37.5 mL/min (A) (by C-G formula based on SCr of 1.19 mg/dL (H)).    Allergies  Allergen Reactions   Hydromorphone  Anaphylaxis   Sulfa Antibiotics Rash    Thank you for allowing pharmacy to be a part of this patient's care.  Marvetta Dauphin, PharmD, BCPS  05/07/2024 12:21 AM

## 2024-05-07 NOTE — H&P (Incomplete)
 History and Physical    Betty Golden FMW:[id number] DOB: 06-03-1946 DOA: 05/06/2024  PCP: Irven Ozell DEL, MD   Patient coming from: Home   Chief Complaint:  Chief Complaint  Patient presents with  . Palpitations  . Shortness of Breath  . Chest Pain   ED TRIAGE note: Pt complains of chest pain and heart palpitations started yesterday. Pain radiates down left arm.             HPI:  Betty Golden is a 78 y.o. female with medical history significant of CVA in 03/2024, chronic CHF, rheumatoid arthritis, fibromyalgia, essential hypertension, COPD .Presented to emergency department multiple complaints include palpitation, chest pain, dyspnea.  Patient reported not feeling well for last 2 days.  Reported mid substernal chest pain with palpitation, exertional dyspnea.  Pain is sharp and stabbing quality worsening with exertion.  Patient unable to ambulate very short distance due to significant dyspnea which is new to per patient's family. Patient denies any fever, chill, nausea, vomiting, UTI symptoms URI symptoms.  Family reported patient is not eating or drinking well.  Patient has been recently discharged from rehab from recent admission from CVA.  During my potation at bedside patient is stating that she developed chest pain with coughing and shortness of breath.  ED Course:  At presentation to ED patient found tachycardic heart rate 122, tachypneic 31 and borderline hypertensive.  O2 sat 100% room air.  Afebrile.  EKG showing sinus tachycardia heart rate 124. Lab work, BMP shows low potassium 3.3, low bicarb 18, elevated creatinine 1.19, elevated anion gap 17 and GFR 47. CBC showing leukocytosis 18.3 otherwise unremarkable. Flat troponin 38 and 36. Blood cultures are in process. Low mag 1.6. Normal TSH and T4. Elevated lactic acid 2 improved to 1.9. VBG unremarkable.  Pending respiratory panel.  CTA chest no evidence of pulmonary embolism.  Prominent emphysematous  changes of the lung.  Multifocal pneumonia.  Aortic atherosclerosis.  Chest x-ray showing no acute disease process.  In the ED patient received aspirin  324 mg in the setting of chest pain, cefepime, ceftriaxone , Ativan , 2 L of LR bolus, vancomycin  and famotidine .   Hospitalist has been consulted for further evaluation management of sepsis in the setting of community-acquired pneumonia, pleuritic chest pain due to pneumonia, elevated troponin secondary to demand ischemia, hypokalemia, hypomagnesemia and acute kidney injury.  Significant labs in the ED: Lab Orders         Blood culture (routine x 2)         Resp panel by RT-PCR (RSV, Flu A&B, Covid) Anterior Nasal Swab         Expectorated Sputum Assessment w Gram Stain, Rflx to Resp Cult         Basic metabolic panel         CBC         Urinalysis, Routine w reflex microscopic -Urine, Clean Catch         Magnesium          TSH         T4, free         Protime-INR         Legionella Pneumophila Serogp 1 Ur Ag         Strep pneumoniae urinary antigen         Procalcitonin         Comprehensive metabolic panel         CBC         I-Stat CG4  Lactic Acid         I-Stat venous blood gas, ([facility] ED, [facility], [facility])         I-Stat Lactic Acid, ED       Review of Systems:  ROS  Past Medical History:  Diagnosis Date  . Asthma   . C. difficile colitis   . Cervical stenosis of spine   . Chronic headaches   . Chronic neck pain   . Coronary artery disease   . Depression   . Diverticulosis   . Essential hypertension   . Fibromyalgia   . GERD (gastroesophageal reflux disease)   . History of Salmonella gastroenteritis   . HNP (herniated nucleus pulposus), lumbar   . Hyperlipidemia   . IBS (irritable bowel syndrome)   . Lung nodule   . rheumatoid arthritis     Past Surgical History:  Procedure Laterality Date  . APPENDECTOMY  1965  . BIOPSY  08/26/2018   Procedure: BIOPSY;  Surgeon: Golda Claudis PENNER, MD;  Location: [facility];   Service: Endoscopy;;  gastric  . BREAST BIOPSY    . BREAST REDUCTION SURGERY  1994  . CATARACT EXTRACTION W/PHACO Left 06/09/2023   Procedure: CATARACT EXTRACTION PHACO AND INTRAOCULAR LENS PLACEMENT (IOC);  Surgeon: Harrie Agent, MD;  Location: [facility];  Service: Ophthalmology;  Laterality: Left;  CDE 7.03  . CATARACT EXTRACTION W/PHACO Right 06/23/2023   Procedure: CATARACT EXTRACTION PHACO AND INTRAOCULAR LENS PLACEMENT (IOC);  Surgeon: Harrie Agent, MD;  Location: [facility];  Service: Ophthalmology;  Laterality: Right;  CDE: 11.55  . CHOLECYSTECTOMY  1975  . COLONOSCOPY N/A 08/19/2013   Procedure: COLONOSCOPY;  Surgeon: Claudis PENNER Golda, MD;  Location: [facility];  Service: Endoscopy;  Laterality: N/A;  155-moved to 140 Ann to notify pt  . COLONOSCOPY N/A 08/26/2018   Procedure: COLONOSCOPY;  Surgeon: Golda Claudis PENNER, MD;  Location: [facility];  Service: Endoscopy;  Laterality: N/A;  . ESOPHAGOGASTRODUODENOSCOPY N/A 08/26/2018   Procedure: ESOPHAGOGASTRODUODENOSCOPY (EGD);  Surgeon: Golda Claudis PENNER, MD;  Location: [facility];  Service: Endoscopy;  Laterality: N/A;  . fibromyalgia    . LUMBAR LAMINECTOMY/DECOMPRESSION MICRODISCECTOMY Bilateral 09/20/2015   Procedure: MICRO LUMBAR DECOMPRESSION L5-S1 BILATERAL    (1 LEVEL);  Surgeon: Reyes Billing, MD;  Location: [facility];  Service: Orthopedics;  Laterality: Bilateral;  . rheumatoid arthritis    . Sinus Surgery    . TONSILLECTOMY  1968     reports that she quit smoking about 35 years ago. Her smoking use included cigarettes. She started smoking about 45 years ago. She has a 20 pack-year smoking history. She has never used smokeless tobacco. She reports that she does not drink alcohol and does not use drugs.  Allergies  Allergen Reactions  . Hydromorphone  Anaphylaxis  . Sulfa Antibiotics Rash    Family History  Problem Relation Age of Onset  . Heart disease Mother   . Hypertension Mother   . Asthma Mother   . Heart disease  Father   . Stroke Father   . Hypertension Father   . Stroke Sister   . Asthma Sister   . Hypertension Brother   . Asthma Brother   . Asthma Maternal Grandmother   . Heart disease Maternal Grandmother   . Diabetes Maternal Grandfather   . Asthma Maternal Aunt   . Colon cancer Neg Hx     Prior to Admission medications   Medication Sig Start Date End Date Taking? Authorizing Provider  acetaminophen  (TYLENOL ) 325  MG tablet Take 1-2 tablets (325-650 mg total) by mouth every 4 (four) hours as needed for mild pain (pain score 1-3). 04/19/24   Love, Sharlet RAMAN, PA-C  albuterol  (PROAIR  HFA) 108 (90 Base) MCG/ACT inhaler Inhale 2 puffs into the lungs every 4 (four) hours as needed for wheezing or shortness of breath. 04/16/23   Iva Marty Saltness, MD  amLODipine  (NORVASC ) 10 MG tablet Take 1 tablet (10 mg total) by mouth daily. 04/28/24   Love, Sharlet RAMAN, PA-C  aspirin  EC 81 MG tablet Take 1 tablet (81 mg total) by mouth daily. Swallow whole. 04/28/24 08/06/24  Love, Sharlet RAMAN, PA-C  benralizumab  (FASENRA ) 30 MG/ML prefilled syringe Inject 1 mL (30 mg total) into the skin every 8 (eight) weeks. 07/31/23   Iva Marty Saltness, MD  Certolizumab Pegol (CIMZIA [place]) Inject 1 Dose into the skin every 14 (fourteen) days.    [provider]  clindamycin (CLEOCIN T) 1 % lotion Apply 1 Application topically daily.    [provider]  clopidogrel  (PLAVIX ) 75 MG tablet Take 1 tablet (75 mg total) by mouth daily. 04/28/24 07/27/24  Love, Sharlet RAMAN, PA-C  DULoxetine  (CYMBALTA ) 30 MG capsule Take 1 capsule (30 mg total) by mouth 2 (two) times daily. 04/28/24   Love, Sharlet RAMAN, PA-C  Ferrous Sulfate (IRON PO) Take 65 mg by mouth daily.    [provider]  fluticasone  (FLONASE ) 50 MCG/ACT nasal spray SPRAY 2 SPRAYS INTO EACH NOSTRIL EVERY DAY 10/15/23   Iva Marty Saltness, MD  Fluticasone -Umeclidin-Vilant (TRELEGY ELLIPTA ) 200-62.5-25 MCG/ACT AEPB Inhale 1 puff into the lungs in the morning. 10/15/23    Iva Marty Saltness, MD  leflunomide  (ARAVA ) 20 MG tablet Take 20 mg by mouth daily.    [provider]  melatonin 5 MG TABS Take 1 tablet (5 mg total) by mouth at bedtime. 04/28/24   Love, Sharlet RAMAN, PA-C  metoprolol  succinate (TOPROL  XL) 25 MG 24 hr tablet Take 1 tablet (25 mg total) by mouth daily. 04/28/24   Love, Sharlet RAMAN, PA-C  Multiple Vitamin (MULTIVITAMIN PO) Take 1 tablet by mouth daily at 6 (six) AM.    [provider]  pantoprazole  (PROTONIX ) 40 MG tablet Take 1 tablet (40 mg total) by mouth daily. 04/28/24   Love, Sharlet RAMAN, PA-C  potassium chloride  (KLOR-CON  M) 10 MEQ tablet Take 3 tablets (30 mEq total) by mouth daily. 04/29/24   Love, Sharlet RAMAN, PA-C  potassium chloride  (KLOR-CON ) 10 MEQ tablet Take 10 mEq by mouth 3 (three) times daily. 05/04/24   [provider]  RESTASIS  0.05 % ophthalmic emulsion Place 1 drop into both eyes 2 (two) times daily. 04/01/23   [provider]  rosuvastatin  (CRESTOR ) 20 MG tablet Take 1 tablet (20 mg total) by mouth daily. 04/28/24   Love, Sharlet RAMAN, PA-C  traZODone  (DESYREL ) 50 MG tablet Take 0.5 tablets (25 mg total) by mouth at bedtime. 04/28/24   Love, Sharlet RAMAN, PA-C  vitamin B-12 (CYANOCOBALAMIN ) 1000 MCG tablet Take 1 tablet (1,000 mcg total) by mouth daily. 10/24/21   Neomi Johnston DASEN, PA-C     Physical Exam: Vitals:   05/06/24 2022 05/06/24 2100 05/06/24 2145 05/06/24 2200  BP:  (!) 164/81 (!) 167/114 (!) 165/115  Pulse:  (!) 124 (!) 119 (!) 122  Resp:  (!) 31 (!) 26 (!) 22  Temp: 98.8 F (37.1 C)     SpO2:  100% 100% 100%  Weight:        Physical Exam  Labs on Admission: I have personally reviewed following labs and imaging studies  CBC: Recent Labs  Lab 05/06/24 1741 05/06/24 2023  WBC 18.3*  --   HGB 12.9 12.6  HCT 38.3 37.0  MCV 84.4  --   PLT 213  --    Basic Metabolic Panel: Recent Labs  Lab 05/06/24 1741 05/06/24 2010 05/06/24 2023  NA 138  --  142  K 3.3*  --  2.8*  CL 103  --    --   CO2 18*  --   --   GLUCOSE 119*  --   --   BUN 11  --   --   CREATININE 1.19*  --   --   CALCIUM  9.6  --   --   MG  --  1.6*  --    GFR: Estimated Creatinine Clearance: 37.5 mL/min (A) (by C-G formula based on SCr of 1.19 mg/dL (H)). Liver Function Tests: No results for input(s): AST, ALT, ALKPHOS, BILITOT, PROT, ALBUMIN in the last 168 hours. No results for input(s): LIPASE, AMYLASE in the last 168 hours. No results for input(s): AMMONIA in the last 168 hours. Coagulation Profile: No results for input(s): INR, PROTIME in the last 168 hours. Cardiac Enzymes: Recent Labs  Lab 05/06/24 1741 05/06/24 2010  TROPONINIHS 38* 36*   BNP (last 3 results) No results for input(s): BNP in the last 8760 hours. HbA1C: No results for input(s): HGBA1C in the last 72 hours. CBG: No results for input(s): GLUCAP in the last 168 hours. Lipid Profile: No results for input(s): CHOL, HDL, LDLCALC, TRIG, CHOLHDL, LDLDIRECT in the last 72 hours. Thyroid Function Tests: Recent Labs    05/06/24 2010  TSH 2.627  FREET4 1.12   Anemia Panel: No results for input(s): VITAMINB12, FOLATE, FERRITIN, TIBC, IRON, RETICCTPCT in the last 72 hours. Urine analysis:    Component Value Date/Time   COLORURINE YELLOW 04/25/2024 0831   APPEARANCEUR HAZY (A) 04/25/2024 0831   APPEARANCEUR Clear 01/18/2022 1311   LABSPEC 1.012 04/25/2024 0831   PHURINE 5.0 04/25/2024 0831   GLUCOSEU NEGATIVE 04/25/2024 0831   HGBUR NEGATIVE 04/25/2024 0831   BILIRUBINUR NEGATIVE 04/25/2024 0831   BILIRUBINUR Negative 01/18/2022 1311   KETONESUR 20 (A) 04/25/2024 0831   PROTEINUR NEGATIVE 04/25/2024 0831   UROBILINOGEN 0.2 03/09/2015 1035   NITRITE NEGATIVE 04/25/2024 0831   LEUKOCYTESUR NEGATIVE 04/25/2024 0831    Radiological Exams on Admission: I have personally reviewed images CT Angio Chest PE W and/or Wo Contrast Result Date: 05/06/2024 CLINICAL DATA:   Pulmonary embolus suspected with high probability. Chest pain radiating to the left arm. History of cardiac issues. EXAM: CT ANGIOGRAPHY CHEST WITH CONTRAST TECHNIQUE: Multidetector CT imaging of the chest was performed using the standard protocol during bolus administration of intravenous contrast. Multiplanar CT image reconstructions and MIPs were obtained to evaluate the vascular anatomy. RADIATION DOSE REDUCTION: This exam was performed according to the departmental dose-optimization program which includes automated exposure control, adjustment of the mA and/or kV according to patient size and/or use of iterative reconstruction technique. CONTRAST:  75mL OMNIPAQUE  IOHEXOL  350 MG/ML SOLN COMPARISON:  Chest radiograph 05/06/2024.  CT chest 09/25/2021 FINDINGS: Cardiovascular: Technically adequate study with good opacification of the central and segmental pulmonary arteries. Mild motion artifact. No focal filling defects. No evidence of significant pulmonary embolus. Normal caliber thoracic aorta. No aortic dissection. Normal heart size. No pericardial effusions. Mild scattered aortic calcification. Mediastinum/Nodes: Esophagus is decompressed. No significant lymphadenopathy. Thyroid gland is unremarkable. Lungs/Pleura:  Diffuse emphysematous changes throughout the lungs. Patchy ground-glass infiltrates bilaterally likely representing multifocal pneumonia or possibly edema. No pleural effusion or pneumothorax. Upper Abdomen: No acute abnormality. Musculoskeletal: Degenerative changes in the spine and shoulders. No acute bony abnormalities. Review of the MIP images confirms the above findings. IMPRESSION: 1. No evidence of significant pulmonary embolus. 2. Prominent emphysematous changes throughout the lungs. 3. Patchy ground-glass infiltrates in the lungs likely represent multifocal pneumonia. 4. Mild aortic atherosclerosis. Electronically Signed   By: Elsie Gravely M.D.   On: 05/06/2024 22:51   DG Chest  Portable 1 View Result Date: 05/06/2024 EXAM: 1 VIEW XRAY OF THE CHEST 05/06/2024 07:54:00 PM COMPARISON: None available. CLINICAL HISTORY: dib, cap?. CP, SOB and palpitations. Recent stroke. FINDINGS: LUNGS AND PLEURA: No focal pulmonary opacity. No pulmonary edema. No pleural effusion. No pneumothorax. HEART AND MEDIASTINUM: No acute abnormality of the cardiac and mediastinal silhouettes. BONES AND SOFT TISSUES: Osseous structures are age appropriate. IMPRESSION: 1. No acute process. Electronically signed by: Dorethia Molt MD 05/06/2024 07:58 PM EDT RP Workstation: [id number]     EKG: My personal interpretation of EKG shows: ***    Assessment/Plan: Principal Problem:   Sepsis due to pneumonia [facility]) Active Problems:   Acute kidney injury superimposed on chronic kidney disease   Elevated troponin   Rheumatoid arthritis (HCC)   Essential hypertension   Hypokalemia   Hypomagnesemia   Pleuritic chest pain   History of CVA (cerebrovascular accident)   History of COPD   Sepsis due to Klebsiella pneumoniae (HCC)    Assessment and Plan: Sepsis secondary to pneumonia History of chronic dyspnea History of COPD  -Presented emergency department with complaining of orthopnea, dyspnea, chest pain with deep breathing, cough and not feeling well for last few days.  Family reported patient also has poor oral intake. -At presentation to ED patient found tachycardic, tachypneic, borderline hypertensive.  Afebrile.   O2 sat 100% room air.  Elevated lactic acid.  CBC showing leukocytosis. -Chest x-ray unremarkable - CTA chest ruled out PE however it shows multifocal pneumonia. - Patient meets sepsis criteria - In the ED patient received 1 L of NS bolus and lactic acid has been improved - Currently on maintenance fluid LR 150 cc/h - - Given patient being admitted 1 month ago for acute CVA high risk for for development of Marcet Pseudomonas associated pneumonia - Continue IV vancomycin  as cefepime  with pharmacy consult.  Need to follow-up with blood culture, sputum culture, urine Legionella urine strep test.  Pending procalcitonin and respiratory panel. -Continue supportive care  Elevated troponin secondary to demand ischemia -Flat troponin 38 and 36.  EKG showed normal sinus rhythm there is no history of abnormality.  Elevated troponin in this context of demand ischemia from sepsis and sinus tachycardia. - Obtaining echocardiogram to assess for wall motion abnormality. - In the ED patient received aspirin  load in the setting of elevated troponin however as there is no concern for ACS there is no indication to continue aspirin . - If echocardiogram shows any wall motion abnormality need to consult cardiology for further evaluation. -Continue cardiac monitoring  History of COPD - Continue ipratropium as needed and Breztri  twice daily   History of chronic sinus tachycardia History of essential hypertension History of chronic diastolic heart failure -Persistent sinus tachycardia heart rate upper 120s range.  Patient missed dose of Toprol -XL today. - Continue Toprol -XL 25 mg daily.  Hypokalemia Hypomagnesemia - Repleted with oral KCl and IV mag sulfate.   Nonpleuritic chest pain -Patient  reported feeling sensation of chest pain with cough and deep breathing.  Concern for nonpleuritic chest pain in the setting of pneumonia.  At this time there is no concern for acute coronary syndrome.  EKG reassuring.  Flat troponin.  Acute kidney injury CKD stage III A -Elevated creatinine 1.19.  And low GFR 47.  Prerenal acute kidney injury in the setting of sepsis. - Continue maintenance fluid LR.  Monitor urine output.  Avoid neurotoxic agent.  Monitor renal function.  History of rheumatoid arthritis -Hold leflunomide  in the setting of acute sepsis from pneumonia.  History of recent CVA -Patient completed 3 weeks course of aspirin  and Plavix  currently supposed to be on Plavix  75 mg daily. -  Continue Plavix  75 mg daily, Crestor  20 mg daily.  Insomnia  Generalized anxiety disorder - Continue Cymbalta  twice daily   DVT prophylaxis:  SQ Heparin  Code Status:  Full Code Diet:  Family Communication:  *** Family was present at bedside, at the time of interview.  Opportunity was given to ask question and all questions were answered satisfactorily.  Disposition Plan: Pending echocardiogram.  Need to follow-up with respiratory panel, blood culture, sputum culture procalcitonin level urine Legionella urine strep test. Consults: None indicated at this time Admission status:   Inpatient, Telemetry bed  Severity of Illness: The appropriate patient status for this patient is INPATIENT. Inpatient status is judged to be reasonable and necessary in order to provide the required intensity of service to ensure the patient's safety. The patient's presenting symptoms, physical exam findings, and initial radiographic and laboratory data in the context of their chronic comorbidities is felt to place them at high risk for further clinical deterioration. Furthermore, it is not anticipated that the patient will be medically stable for discharge from the hospital within 2 midnights of admission.   * I certify that at the point of admission it is my clinical judgment that the patient will require inpatient hospital care spanning beyond 2 midnights from the point of admission due to high intensity of service, high risk for further deterioration and high frequency of surveillance required.DEWAINE    Matthe Sloane, MD [facility]  How to contact the [facility] Attending or Consulting provider 7A - 7P or covering provider during after hours 7P -7A, for this patient.  Check the care team in [facility] and look for a) attending/consulting [facility] provider listed and b) the [facility] team listed Log into [email or web address] and use [facility]'s universal password to access. If you do not have the password, please contact the hospital  operator. Locate the [facility] provider you are looking for under [facility] and page to a number that you can be directly reached. If you still have difficulty reaching the provider, please page the [facility] (Director on Call) for the Hospitalists listed on amion for assistance.  05/06/2024, 11:55 PM

## 2024-05-08 ENCOUNTER — Encounter (HOSPITAL_COMMUNITY): Payer: Self-pay | Admitting: Internal Medicine

## 2024-05-08 DIAGNOSIS — J410 Simple chronic bronchitis: Secondary | ICD-10-CM

## 2024-05-08 DIAGNOSIS — R0781 Pleurodynia: Secondary | ICD-10-CM | POA: Diagnosis not present

## 2024-05-08 DIAGNOSIS — R7989 Other specified abnormal findings of blood chemistry: Secondary | ICD-10-CM | POA: Diagnosis not present

## 2024-05-08 DIAGNOSIS — J189 Pneumonia, unspecified organism: Secondary | ICD-10-CM | POA: Diagnosis not present

## 2024-05-08 DIAGNOSIS — R4701 Aphasia: Secondary | ICD-10-CM

## 2024-05-08 LAB — CBC WITH DIFFERENTIAL/PLATELET
Abs Immature Granulocytes: 0.08 K/uL — ABNORMAL HIGH (ref 0.00–0.07)
Basophils Absolute: 0 K/uL (ref 0.0–0.1)
Basophils Relative: 0 %
Eosinophils Absolute: 0 K/uL (ref 0.0–0.5)
Eosinophils Relative: 0 %
HCT: 34.4 % — ABNORMAL LOW (ref 36.0–46.0)
Hemoglobin: 11 g/dL — ABNORMAL LOW (ref 12.0–15.0)
Immature Granulocytes: 1 %
Lymphocytes Relative: 19 %
Lymphs Abs: 3.2 K/uL (ref 0.7–4.0)
MCH: 28 pg (ref 26.0–34.0)
MCHC: 32 g/dL (ref 30.0–36.0)
MCV: 87.5 fL (ref 80.0–100.0)
Monocytes Absolute: 0.9 K/uL (ref 0.1–1.0)
Monocytes Relative: 5 %
Neutro Abs: 12.7 K/uL — ABNORMAL HIGH (ref 1.7–7.7)
Neutrophils Relative %: 75 %
Platelets: 166 K/uL (ref 150–400)
RBC: 3.93 MIL/uL (ref 3.87–5.11)
RDW: 14.1 % (ref 11.5–15.5)
WBC: 16.9 K/uL — ABNORMAL HIGH (ref 4.0–10.5)
nRBC: 0 % (ref 0.0–0.2)

## 2024-05-08 LAB — BASIC METABOLIC PANEL WITH GFR
Anion gap: 11 (ref 5–15)
BUN: 9 mg/dL (ref 8–23)
CO2: 18 mmol/L — ABNORMAL LOW (ref 22–32)
Calcium: 8.9 mg/dL (ref 8.9–10.3)
Chloride: 110 mmol/L (ref 98–111)
Creatinine, Ser: 1.16 mg/dL — ABNORMAL HIGH (ref 0.44–1.00)
GFR, Estimated: 49 mL/min — ABNORMAL LOW (ref >60)
Glucose, Bld: 83 mg/dL (ref 70–99)
Potassium: 3.1 mmol/L — ABNORMAL LOW (ref 3.5–5.1)
Sodium: 139 mmol/L (ref 135–145)

## 2024-05-08 LAB — GLUCOSE, CAPILLARY: Glucose-Capillary: 75 mg/dL (ref 70–99)

## 2024-05-08 LAB — LEGIONELLA PNEUMOPHILA SEROGP 1 UR AG: L. pneumophila Serogp 1 Ur Ag: NEGATIVE

## 2024-05-08 LAB — MAGNESIUM: Magnesium: 1.7 mg/dL (ref 1.7–2.4)

## 2024-05-08 MED ORDER — POTASSIUM CHLORIDE CRYS ER 20 MEQ PO TBCR
40.0000 meq | EXTENDED_RELEASE_TABLET | ORAL | Status: AC
  Administered 2024-05-08 (×2): 40 meq via ORAL
  Filled 2024-05-08 (×2): qty 2

## 2024-05-08 MED ORDER — ACETAMINOPHEN 650 MG RE SUPP: 650.0000 mg | Freq: Four times a day (QID) | RECTAL | Status: DC | PRN

## 2024-05-08 MED ORDER — ACETAMINOPHEN 500 MG PO TABS: 1000.0000 mg | ORAL_TABLET | Freq: Four times a day (QID) | ORAL | Status: DC | PRN

## 2024-05-08 MED ORDER — SACCHAROMYCES BOULARDII 250 MG PO CAPS
250.0000 mg | ORAL_CAPSULE | Freq: Two times a day (BID) | ORAL | Status: DC
  Administered 2024-05-08 – 2024-05-09 (×2): 250 mg via ORAL
  Filled 2024-05-08 (×2): qty 1

## 2024-05-08 MED ORDER — AZITHROMYCIN 250 MG PO TABS
500.0000 mg | ORAL_TABLET | Freq: Every day | ORAL | Status: DC
  Administered 2024-05-09: 500 mg via ORAL
  Filled 2024-05-08: qty 2

## 2024-05-08 MED ORDER — SODIUM CHLORIDE 0.9 % IV SOLN
1.0000 g | INTRAVENOUS | Status: DC
  Administered 2024-05-08 – 2024-05-09 (×2): 1 g via INTRAVENOUS
  Filled 2024-05-08 (×3): qty 10

## 2024-05-08 NOTE — Assessment & Plan Note (Addendum)
 05/08/24 present on admission. Meets criteria on admission. WBC 18.3, HR 123, temp 102.3. stop cefepime/vanco. Change to rocephin /zithromax . Can go home on duricef/zithromax . Start probiotics. On RA. Afebrile for 24 hours. Strep pneumo antigen negative. Resp Viral panel negative. WBC down to 16.9. repeat CBC in AM. Procal < 0.10  05/09/24 awaiting PT evaluation. Can do home with duricef and zithromax . On RA. Continue probiotic at home.   *update. Discussed with PT. Pt walked 100 feet with RW. Will order home DME Rolling walker in case she does not have one at home. And home health PT.

## 2024-05-08 NOTE — Assessment & Plan Note (Deleted)
05/08/24  

## 2024-05-08 NOTE — Assessment & Plan Note (Addendum)
 05/08/24 admission Mg of 1.6, repleted. Resolved.

## 2024-05-08 NOTE — Plan of Care (Signed)

## 2024-05-08 NOTE — Assessment & Plan Note (Addendum)
 05/08/24 stable. On inhalers and prn nebs. Not exacerbated. On RA. No wheezing.  05/09/24 stable. On RA. Continue inhalers at home. Not exacerbated. Does not need steroids.

## 2024-05-08 NOTE — Plan of Care (Signed)

## 2024-05-08 NOTE — Progress Notes (Signed)
 PROGRESS NOTE    Betty Golden  FMW:[id number] DOB: 08-15-1945 DOA: 05/06/2024 PCP: Irven Ozell DEL, MD  Subjective: Pt seen and examined. Having some expressive aphasia due to her prior CVA. Afebrile for 24 hours now.    Hospital Course: CC: chest pain, palpitations,  HPI: Betty Golden is a 78 y.o. 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  Patient reported not feeling well for last 2 days.  Reported midepigastric pain that radiates to the chest with associated heartburn.  Patient also reported chest pain with cough and deep breathing as well.  Reporting exertional dyspnea.  Patient unable to ambulate very short distance due to significant dyspnea which is new to per patient's family. Patient denies any fever, chill, nausea, vomiting, UTI symptoms URI symptoms.  Family reported patient is not eating or drinking well.  Patient has been recently discharged from rehab from recent admission from CVA.   During my potation at bedside patient is stating that she developed chest pain with coughing and shortness of breath.   Patient reported midepigastric pain that get worsen with eating food which radiates to lower chest as well. Patient does not have any other complaint at this time.   ED Course:  At presentation to ED patient found tachycardic heart rate 122, tachypneic 31 and borderline hypertensive.  O2 sat 100% room air.  Afebrile.   EKG showing sinus tachycardia heart rate 124. Lab work, BMP shows low potassium 3.3, low bicarb 18, elevated creatinine 1.19, elevated anion gap 17 and GFR 47. CBC showing leukocytosis 18.3 otherwise unremarkable. Flat troponin 38 and 36. Blood cultures are in process. Low mag 1.6. Normal TSH and T4. Elevated lactic acid 2 improved to 1.9. VBG unremarkable.   Pending  respiratory panel.   CTA chest no evidence of pulmonary embolism.  Prominent emphysematous changes of the lung.  Multifocal pneumonia.  Aortic atherosclerosis.   Chest x-ray showing no acute disease process.   In the ED patient received aspirin  324 mg in the setting of chest pain, cefepime, ceftriaxone , Ativan , 2 L of LR bolus, vancomycin  and famotidine .     Hospitalist has been consulted for further evaluation management of sepsis in the setting of community-acquired pneumonia, pleuritic chest pain due to pneumonia, elevated troponin secondary to demand ischemia, hypokalemia, hypomagnesemia and acute kidney injury.  Significant Events: Admitted 05/06/2024 for pneumonia   Admission Labs: WBC 18.3, HgB 12.9, plt 213 Na 138, K 3.3, CO2 of 18, BUN 11, scr 1.19, glu 119 VBG pH 7.4, PCO2 of 38 Lactic acid 2.0 TSH 2.62, FT4 of 1.12 Mg 1.6 Procalcitonin < 0.10 UA hazy, spg 1.031, HgB moderate, LE large, WBC 11-20, rare bacteria Covid/rsv/flu negative  Admission Imaging Studies: CXR No acute process.  CTPA No evidence of significant pulmonary embolus. 2. Prominent emphysematous changes throughout the lungs. 3. Patchy ground-glass infiltrates in the lungs likely represent multifocal pneumonia. 4. Mild aortic atherosclerosis  Significant Labs: Strep Pneumo negative Resp Viral Panel Negative  Significant Imaging Studies:   Antibiotic Therapy: Anti-infectives (From admission, onward)    Start     Dose/Rate Route Frequency Ordered Stop   05/07/24 2000  vancomycin  (VANCOREADY) IVPB 750 mg/150 mL        750 mg 150 mL/hr over 60 Minutes Intravenous Every 24 hours 05/07/24 0024     05/07/24 1200  ceFEPIme (MAXIPIME) 2 g in sodium chloride  0.9 % 100  mL IVPB        2 g 200 mL/hr over 30 Minutes Intravenous Every 12 hours 05/07/24 0024     05/06/24 2315  vancomycin  (VANCOCIN ) IVPB 1000 mg/200 mL premix        1,000 mg 200 mL/hr over 60 Minutes Intravenous  Once 05/06/24 2304 05/07/24 0328    05/06/24 2315  ceFEPIme (MAXIPIME) 2 g in sodium chloride  0.9 % 100 mL IVPB        2 g 200 mL/hr over 30 Minutes Intravenous  Once 05/06/24 2304 05/07/24 0123   05/06/24 1900  cefTRIAXone  (ROCEPHIN ) 1 g in sodium chloride  0.9 % 100 mL IVPB        1 g 200 mL/hr over 30 Minutes Intravenous  Once 05/06/24 1856 05/06/24 2143       Procedures:   Consultants:     Assessment and Plan: * Sepsis due to pneumonia (HCC) 05/08/24 present on admission. Meets criteria on admission. WBC 18.3, HR 123, temp 102.3. stop cefepime/vanco. Change to rocephin /zithromax . Can go home on duricef/zithromax . Start probiotics. On RA. Afebrile for 24 hours. Strep pneumo antigen negative. Resp Viral panel negative. WBC down to 16.9. repeat CBC in AM. Procal < 0.10   Pleuritic chest pain 05/08/24 due to pneumonia.  Elevated troponin 05/08/24 troponin flat. Likely demand ischemia from sepsis. NOT NSTEMI or ACS.  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 Stable.   Rheumatoid arthritis (HCC) 05/08/24 stable.  Hypokalemia 05/08/24 replete with po kcl. Repeat BMP in AM.  COPD (chronic obstructive pulmonary disease) (HCC) 05/08/24 stable. On inhalers and prn nebs. Not exacerbated. On RA. No wheezing.  Essential hypertension 05/08/24 BP stable. On Toprol -XL 25 mg daily.   Hypomagnesemia-resolved as of 05/08/2024 05/08/24 admission Mg of 1.6, repleted. Resolved.  DVT prophylaxis: heparin  injection 5,000 Units Start: 05/07/24 0600 SCDs Start: 05/06/24 2356 Place TED hose Start: 05/06/24 2356  SQ Heparin    Code Status: Full Code Family Communication: discussed with pt's dtr [name] Hugger Disposition Plan: Home Reason for continuing need for hospitalization: remains on IV ABX.  Objective: Vitals:   05/08/24 0240 05/08/24 0535 05/08/24 0755 05/08/24 0948  BP: (!) 143/64 (!) 143/62  (!) 155/74  Pulse: (!) 103 (!) 102  (!) 109  Resp: 18 17  18   Temp: 98.9 F (37.2 C) 97.7 F  (36.5 C)  97.6 F (36.4 C)  TempSrc: Oral     SpO2: 96% 97% 98% 100%  Weight:      Height:        Intake/Output Summary (Last 24 hours) at 05/08/2024 1613 Last data filed at 05/08/2024 1300 Gross per 24 hour  Intake 749.49 ml  Output --  Net 749.49 ml   Filed Weights   05/06/24 1721 05/07/24 1839  Weight: 69 kg 69 kg    Examination:  Physical Exam Vitals and nursing note reviewed.  Constitutional:      General: She is not in acute distress.    Appearance: She is not toxic-appearing or diaphoretic.  HENT:     Head: Normocephalic and atraumatic.  Eyes:     General: No scleral icterus. Cardiovascular:     Rate and Rhythm: Normal rate and regular rhythm.  Pulmonary:     Effort: Pulmonary effort is normal.     Breath sounds: Normal breath sounds.  Abdominal:     General: Bowel sounds are normal. There is no distension.     Palpations: Abdomen is soft.  Musculoskeletal:     Right lower  leg: No edema.     Left lower leg: No edema.  Skin:    General: Skin is warm and dry.     Capillary Refill: Capillary refill takes less than 2 seconds.  Neurological:     Mental Status: She is alert.     Comments: +expressive aphasia  Able to follow commands.     Data Reviewed: I have personally reviewed following labs and imaging studies  CBC: Recent Labs  Lab 05/06/24 1741 05/06/24 2023 05/07/24 0237 05/08/24 0749  WBC 18.3*  --  14.2* 16.9*  NEUTROABS  --   --   --  12.7*  HGB 12.9 12.6 11.8* 11.0*  HCT 38.3 37.0 35.8* 34.4*  MCV 84.4  --  86.3 87.5  PLT 213  --  182 166   Basic Metabolic Panel: Recent Labs  Lab 05/06/24 1741 05/06/24 2010 05/06/24 2023 05/07/24 0237 05/08/24 0749  NA 138  --  142 140 139  K 3.3*  --  2.8* 3.3* 3.1*  CL 103  --   --  110 110  CO2 18*  --   --  19* 18*  GLUCOSE 119*  --   --  113* 83  BUN 11  --   --  11 9  CREATININE 1.19*  --   --  1.05* 1.16*  CALCIUM  9.6  --   --  8.4* 8.9  MG  --  1.6*  --   --  1.7    GFR: Estimated Creatinine Clearance: 38.4 mL/min (A) (by C-G formula based on SCr of 1.16 mg/dL (H)). Liver Function Tests: Recent Labs  Lab 05/07/24 0237  AST 24  ALT 19  ALKPHOS 42  BILITOT 0.6  PROT 6.1*  ALBUMIN 3.3*   Coagulation Profile: Recent Labs  Lab 05/07/24 0020  INR 0.9   CBG: Recent Labs  Lab 05/08/24 0533  GLUCAP 75   Thyroid Function Tests: Recent Labs    05/06/24 2010  TSH 2.627  FREET4 1.12   Sepsis Labs: Recent Labs  Lab 05/06/24 2025 05/06/24 2231 05/07/24 0020  PROCALCITON  --   --  <0.10  LATICACIDVEN 2.0* 1.9  --     Recent Results (from the past 240 hours)  Blood culture (routine x 2)     Status: None (Preliminary result)   Collection Time: 05/06/24  8:10 PM   Specimen: BLOOD RIGHT ARM  Result Value Ref Range Status   Specimen Description BLOOD RIGHT ARM  Final   Special Requests   Final    BOTTLES DRAWN AEROBIC AND ANAEROBIC Blood Culture adequate volume   Culture   Final    NO GROWTH 2 DAYS Performed at [facility], [address], [place], [place] [address]    Report Status PENDING  Incomplete  Blood culture (routine x 2)     Status: None (Preliminary result)   Collection Time: 05/06/24 10:13 PM   Specimen: BLOOD  Result Value Ref Range Status   Specimen Description BLOOD SITE NOT SPECIFIED  Final   Special Requests   Final    BOTTLES DRAWN AEROBIC ONLY Blood Culture results may not be optimal due to an inadequate volume of blood received in culture bottles   Culture   Final    NO GROWTH 2 DAYS Performed at [facility], [address], [place], [place] [address]    Report Status PENDING  Incomplete  Resp panel by RT-PCR (RSV, Flu A&B, Covid) Urine, Clean Catch  Status: None   Collection Time: 05/07/24 12:20 AM   Specimen: Urine, Clean Catch; Nasal Swab  Result Value Ref Range Status   SARS Coronavirus 2 by RT PCR NEGATIVE NEGATIVE Final   Influenza A by PCR NEGATIVE NEGATIVE Final   Influenza B by  PCR NEGATIVE NEGATIVE Final    Comment: (NOTE) The Xpert Xpress SARS-CoV-2/FLU/RSV plus assay is intended as an aid in the diagnosis of influenza from Nasopharyngeal swab specimens and should not be used as a sole basis for treatment. Nasal washings and aspirates are unacceptable for Xpert Xpress SARS-CoV-2/FLU/RSV testing.  Fact Sheet for Patients: BloggerCourse.com  Fact Sheet for Healthcare Providers: SeriousBroker.it  This test is not yet approved or cleared by the [place]  FDA and has been authorized for detection and/or diagnosis of SARS-CoV-2 by FDA under an Emergency Use Authorization (EUA). This EUA will remain in effect (meaning this test can be used) for the duration of the COVID-19 declaration under Section 564(b)(1) of the Act, 21 U.S.C. section 360bbb-3(b)(1), unless the authorization is terminated or revoked.     Resp Syncytial Virus by PCR NEGATIVE NEGATIVE Final    Comment: (NOTE) Fact Sheet for Patients: BloggerCourse.com  Fact Sheet for Healthcare Providers: SeriousBroker.it  This test is not yet approved or cleared by the [place]  FDA and has been authorized for detection and/or diagnosis of SARS-CoV-2 by FDA under an Emergency Use Authorization (EUA). This EUA will remain in effect (meaning this test can be used) for the duration of the COVID-19 declaration under Section 564(b)(1) of the Act, 21 U.S.C. section 360bbb-3(b)(1), unless the authorization is terminated or revoked.  Performed at [facility], [address] [place], [place] [address]   Respiratory (~20 pathogens) panel by PCR     Status: None   Collection Time: 05/07/24  7:42 AM   Specimen: Nasopharyngeal Swab; Respiratory  Result Value Ref Range Status   Adenovirus NOT DETECTED NOT DETECTED Final   Coronavirus 229E NOT DETECTED NOT DETECTED Final    Comment: (NOTE) The  Coronavirus on the Respiratory Panel, DOES NOT test for the novel  Coronavirus (2019 nCoV)    Coronavirus HKU1 NOT DETECTED NOT DETECTED Final   Coronavirus NL63 NOT DETECTED NOT DETECTED Final   Coronavirus OC43 NOT DETECTED NOT DETECTED Final   Metapneumovirus NOT DETECTED NOT DETECTED Final   Rhinovirus / Enterovirus NOT DETECTED NOT DETECTED Final   Influenza A NOT DETECTED NOT DETECTED Final   Influenza B NOT DETECTED NOT DETECTED Final   Parainfluenza Virus 1 NOT DETECTED NOT DETECTED Final   Parainfluenza Virus 2 NOT DETECTED NOT DETECTED Final   Parainfluenza Virus 3 NOT DETECTED NOT DETECTED Final   Parainfluenza Virus 4 NOT DETECTED NOT DETECTED Final   Respiratory Syncytial Virus NOT DETECTED NOT DETECTED Final   Bordetella pertussis NOT DETECTED NOT DETECTED Final   Bordetella Parapertussis NOT DETECTED NOT DETECTED Final   Chlamydophila pneumoniae NOT DETECTED NOT DETECTED Final   Mycoplasma pneumoniae NOT DETECTED NOT DETECTED Final    Comment: Performed at [facility], [address] [place], [place] [address]  MRSA Next Gen by PCR, Nasal     Status: None   Collection Time: 05/07/24  9:36 AM   Specimen: Nasopharyngeal Swab; Nasal Swab  Result Value Ref Range Status   MRSA by PCR Next Gen NOT DETECTED NOT DETECTED Final    Comment: (NOTE) The GeneXpert MRSA Assay (FDA approved for NASAL specimens only), is one component of a comprehensive MRSA  colonization surveillance program. It is not intended to diagnose MRSA infection nor to guide or monitor treatment for MRSA infections. Test performance is not FDA approved in patients less than 18 years old. Performed at [facility], [address] [place], [place] [address]     Radiology Studies: ECHOCARDIOGRAM COMPLETE Result Date: 05/07/2024    ECHOCARDIOGRAM REPORT   Patient Name:   CONNER NEISS Date of Exam: 05/07/2024 Medical Rec #:  [id number]         Height:       63.0 in Accession #:     [id number]        Weight:       152.1 lb Date of Birth:  03-19-1946         BSA:          1.721 m Patient Age:    78 years          BP:           153/77 mmHg Patient Gender: F                 HR:           111 bpm. Exam Location:  Inpatient Procedure: 2D Echo (Both Spectral and Color Flow Doppler were utilized during            procedure). Indications:    Elevated Troponin  History:        Patient has prior history of Echocardiogram examinations.                 Signs/Symptoms:Elevated Troponin.  Sonographer:    Norleen Amour Referring Phys: [id number] SUBRINA SUNDIL IMPRESSIONS  1. Left ventricular ejection fraction, by estimation, is 70 to 75%. The left ventricle has hyperdynamic function. The left ventricle has no regional wall motion abnormalities. Left ventricular diastolic parameters are consistent with Grade I diastolic dysfunction (impaired relaxation).  2. Right ventricular systolic function is normal. The right ventricular size is normal.  3. There is abnormal motion on the ventricular aspect of the valve, near the severe MAC (see image 52). Unclear if this is subvalvular apparatus or other structure. Not typical location of vegetation, but etiology unclear. The mitral valve was not well visualized. Trivial mitral valve regurgitation. No evidence of mitral stenosis. Severe mitral annular calcification.  4. The aortic valve is grossly normal. There is mild calcification of the aortic valve. There is mild thickening of the aortic valve. Aortic valve regurgitation is not visualized. Comparison(s): Changes from prior study are noted. Conclusion(s)/Recommendation(s): See comments re: abnormal motion below the mitral valve annulus. While this is not typical location for vegetation, if there is clinical concern for endocarditis, would recommend TEE for further evaluation. FINDINGS  Left Ventricle: Left ventricular ejection fraction, by estimation, is 70 to 75%. The left ventricle has hyperdynamic function. The left  ventricle has no regional wall motion abnormalities. The left ventricular internal cavity size was normal in size. There is no left ventricular hypertrophy. Left ventricular diastolic parameters are consistent with Grade I diastolic dysfunction (impaired relaxation). Right Ventricle: The right ventricular size is normal. Right vetricular wall thickness was not well visualized. Right ventricular systolic function is normal. Left Atrium: Left atrial size was normal in size. Right Atrium: Right atrial size was normal in size. Pericardium: Trivial pericardial effusion is present. Mitral Valve: There is abnormal motion on the ventricular aspect of the valve, near the severe MAC (see image 52). Unclear if this is subvalvular apparatus or other  structure. Not typical location of vegetation, but etiology unclear. The mitral valve was  not well visualized. Severe mitral annular calcification. Trivial mitral valve regurgitation. No evidence of mitral valve stenosis. MV peak gradient, 16.3 mmHg. The mean mitral valve gradient is 7.0 mmHg. Tricuspid Valve: The tricuspid valve is grossly normal. Tricuspid valve regurgitation is trivial. No evidence of tricuspid stenosis. Aortic Valve: The aortic valve is grossly normal. There is mild calcification of the aortic valve. There is mild thickening of the aortic valve. Aortic valve regurgitation is not visualized. Pulmonic Valve: The pulmonic valve was not well visualized. Pulmonic valve regurgitation is not visualized. No evidence of pulmonic stenosis. Aorta: The aortic root, ascending aorta, aortic arch and descending aorta are all structurally normal, with no evidence of dilitation or obstruction. Venous: The inferior vena cava was not well visualized. IAS/Shunts: The atrial septum is grossly normal.  LEFT VENTRICLE PLAX 2D LVIDd:         3.70 cm     Diastology LVIDs:         2.10 cm     LV e' medial:  8.70 cm/s LV PW:         0.90 cm     LV e' lateral: 7.30 cm/s LV IVS:         0.80 cm LVOT diam:     1.80 cm LV SV:         57 LV SV Index:   33 LVOT Area:     2.54 cm  LV Volumes (MOD) LV vol d, MOD A2C: 37.7 ml LV vol d, MOD A4C: 44.2 ml LV vol s, MOD A2C: 14.1 ml LV vol s, MOD A4C: 18.5 ml LV SV MOD A2C:     23.6 ml LV SV MOD A4C:     44.2 ml LV SV MOD BP:      26.2 ml RIGHT VENTRICLE RV Basal diam:  2.20 cm RV S prime:     12.10 cm/s TAPSE (M-mode): 1.8 cm LEFT ATRIUM             Index        RIGHT ATRIUM          Index LA diam:        4.70 cm 2.73 cm/m   RA Area:     9.61 cm LA Vol (A2C):   25.2 ml 14.64 ml/m  RA Volume:   18.90 ml 10.98 ml/m LA Vol (A4C):   26.5 ml 15.40 ml/m LA Biplane Vol: 26.8 ml 15.57 ml/m  AORTIC VALVE             PULMONIC VALVE LVOT Vmax:   132.00 cm/s PV Vmax:       1.04 m/s LVOT Vmean:  97.600 cm/s PV Peak grad:  4.3 mmHg LVOT VTI:    0.223 m  AORTA Ao Root diam: 2.60 cm Ao Asc diam:  2.50 cm MITRAL VALVE              TRICUSPID VALVE MV Area VTI:  2.07 cm    TR Peak grad:   37.0 mmHg MV Peak grad: 16.3 mmHg   TR Vmax:        304.00 cm/s MV Mean grad: 7.0 mmHg MV Vmax:      2.02 m/s    SHUNTS MV Vmean:     124.0 cm/s  Systemic VTI:  0.22 m  Systemic Diam: 1.80 cm Shelda Bruckner MD Electronically signed by Shelda Bruckner MD Signature Date/Time: 05/07/2024/11:41:39 AM    Final    CT Angio Chest PE W and/or Wo Contrast Result Date: 05/06/2024 CLINICAL DATA:  Pulmonary embolus suspected with high probability. Chest pain radiating to the left arm. History of cardiac issues. EXAM: CT ANGIOGRAPHY CHEST WITH CONTRAST TECHNIQUE: Multidetector CT imaging of the chest was performed using the standard protocol during bolus administration of intravenous contrast. Multiplanar CT image reconstructions and MIPs were obtained to evaluate the vascular anatomy. RADIATION DOSE REDUCTION: This exam was performed according to the departmental dose-optimization program which includes automated exposure control, adjustment of the mA  and/or kV according to patient size and/or use of iterative reconstruction technique. CONTRAST:  75mL OMNIPAQUE  IOHEXOL  350 MG/ML SOLN COMPARISON:  Chest radiograph 05/06/2024.  CT chest 09/25/2021 FINDINGS: Cardiovascular: Technically adequate study with good opacification of the central and segmental pulmonary arteries. Mild motion artifact. No focal filling defects. No evidence of significant pulmonary embolus. Normal caliber thoracic aorta. No aortic dissection. Normal heart size. No pericardial effusions. Mild scattered aortic calcification. Mediastinum/Nodes: Esophagus is decompressed. No significant lymphadenopathy. Thyroid gland is unremarkable. Lungs/Pleura: Diffuse emphysematous changes throughout the lungs. Patchy ground-glass infiltrates bilaterally likely representing multifocal pneumonia or possibly edema. No pleural effusion or pneumothorax. Upper Abdomen: No acute abnormality. Musculoskeletal: Degenerative changes in the spine and shoulders. No acute bony abnormalities. Review of the MIP images confirms the above findings. IMPRESSION: 1. No evidence of significant pulmonary embolus. 2. Prominent emphysematous changes throughout the lungs. 3. Patchy ground-glass infiltrates in the lungs likely represent multifocal pneumonia. 4. Mild aortic atherosclerosis. Electronically Signed   By: Elsie Gravely M.D.   On: 05/06/2024 22:51   DG Chest Portable 1 View Result Date: 05/06/2024 EXAM: 1 VIEW XRAY OF THE CHEST 05/06/2024 07:54:00 PM COMPARISON: None available. CLINICAL HISTORY: dib, cap?. CP, SOB and palpitations. Recent stroke. FINDINGS: LUNGS AND PLEURA: No focal pulmonary opacity. No pulmonary edema. No pleural effusion. No pneumothorax. HEART AND MEDIASTINUM: No acute abnormality of the cardiac and mediastinal silhouettes. BONES AND SOFT TISSUES: Osseous structures are age appropriate. IMPRESSION: 1. No acute process. Electronically signed by: Dorethia Molt MD 05/06/2024 07:58 PM EDT RP  Workstation: [id number]    Scheduled Meds:  aspirin  EC  81 mg Oral Daily   [START ON 05/09/2024] azithromycin   500 mg Oral Daily   budesonide -glycopyrrolate -formoterol   2 puff Inhalation BID   clopidogrel   75 mg Oral Daily   cyanocobalamin   1,000 mcg Oral Daily   DULoxetine   30 mg Oral BID   ferrous sulfate  325 mg Oral Daily   heparin   5,000 Units Subcutaneous Q8H   melatonin  5 mg Oral QHS   metoprolol  succinate  25 mg Oral Daily   pantoprazole   40 mg Oral Daily   potassium chloride   40 mEq Oral Q4H   rosuvastatin   20 mg Oral Daily   saccharomyces boulardii  250 mg Oral BID   sodium chloride  flush  3 mL Intravenous Q12H   sodium chloride  flush  3 mL Intravenous Q12H   traZODone   25 mg Oral QHS   Continuous Infusions:  cefTRIAXone  (ROCEPHIN )  IV      LOS: 2 days   Time spent: 60 minutes  Camellia Door, DO  [facility]  05/08/2024, 4:13 PM

## 2024-05-08 NOTE — Assessment & Plan Note (Addendum)
 05/08/24 stable.  05/09/24 stable. F/u with PCP

## 2024-05-08 NOTE — Subjective & Objective (Signed)
 Pt seen and examined. Having some expressive aphasia due to her prior CVA. Afebrile for 24 hours now.

## 2024-05-08 NOTE — Assessment & Plan Note (Signed)
05/08/24  

## 2024-05-08 NOTE — Assessment & Plan Note (Addendum)
 05/08/24 replete with po kcl. Repeat BMP in AM.  05/09/24 resolved. K 4.1 today.

## 2024-05-08 NOTE — Assessment & Plan Note (Signed)
 05/08/24 chronic. Stable.  05/09/24 chronic. Stable. Due to prior CVA.

## 2024-05-08 NOTE — Assessment & Plan Note (Addendum)
 05/08/24 troponin flat. Likely demand ischemia from sepsis. NOT NSTEMI or ACS.  05/09/24 resolved.

## 2024-05-08 NOTE — Assessment & Plan Note (Addendum)
 05/08/24 due to pneumonia.  05/09/24 resolved.

## 2024-05-08 NOTE — Hospital Course (Signed)
 CC: chest pain, palpitations,  HPI: Betty Golden is a 78 y.o. 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  Patient reported not feeling well for last 2 days.  Reported midepigastric pain that radiates to the chest with associated heartburn.  Patient also reported chest pain with cough and deep breathing as well.  Reporting exertional dyspnea.  Patient unable to ambulate very short distance due to significant dyspnea which is new to per patient's family. Patient denies any fever, chill, nausea, vomiting, UTI symptoms URI symptoms.  Family reported patient is not eating or drinking well.  Patient has been recently discharged from rehab from recent admission from CVA.   During my potation at bedside patient is stating that she developed chest pain with coughing and shortness of breath.   Patient reported midepigastric pain that get worsen with eating food which radiates to lower chest as well. Patient does not have any other complaint at this time.   ED Course:  At presentation to ED patient found tachycardic heart rate 122, tachypneic 31 and borderline hypertensive.  O2 sat 100% room air.  Afebrile.   EKG showing sinus tachycardia heart rate 124. Lab work, BMP shows low potassium 3.3, low bicarb 18, elevated creatinine 1.19, elevated anion gap 17 and GFR 47. CBC showing leukocytosis 18.3 otherwise unremarkable. Flat troponin 38 and 36. Blood cultures are in process. Low mag 1.6. Normal TSH and T4. Elevated lactic acid 2 improved to 1.9. VBG unremarkable.   Pending respiratory panel.   CTA chest no evidence of pulmonary embolism.  Prominent emphysematous changes of the lung.  Multifocal pneumonia.  Aortic atherosclerosis.   Chest x-ray showing no acute disease process.   In the ED patient received aspirin  324 mg in  the setting of chest pain, cefepime, ceftriaxone , Ativan , 2 L of LR bolus, vancomycin  and famotidine .     Hospitalist has been consulted for further evaluation management of sepsis in the setting of community-acquired pneumonia, pleuritic chest pain due to pneumonia, elevated troponin secondary to demand ischemia, hypokalemia, hypomagnesemia and acute kidney injury.  Significant Events: Admitted 05/06/2024 for pneumonia   Admission Labs: WBC 18.3, HgB 12.9, plt 213 Na 138, K 3.3, CO2 of 18, BUN 11, scr 1.19, glu 119 VBG pH 7.4, PCO2 of 38 Lactic acid 2.0 TSH 2.62, FT4 of 1.12 Mg 1.6 Procalcitonin < 0.10 UA hazy, spg 1.031, HgB moderate, LE large, WBC 11-20, rare bacteria Covid/rsv/flu negative  Admission Imaging Studies: CXR No acute process.  CTPA No evidence of significant pulmonary embolus. 2. Prominent emphysematous changes throughout the lungs. 3. Patchy ground-glass infiltrates in the lungs likely represent multifocal pneumonia. 4. Mild aortic atherosclerosis  Significant Labs: Strep Pneumo negative Resp Viral Panel Negative  Significant Imaging Studies:   Antibiotic Therapy: Anti-infectives (From admission, onward)    Start     Dose/Rate Route Frequency Ordered Stop   05/07/24 2000  vancomycin  (VANCOREADY) IVPB 750 mg/150 mL        750 mg 150 mL/hr over 60 Minutes Intravenous Every 24 hours 05/07/24 0024     05/07/24 1200  ceFEPIme (MAXIPIME) 2 g in sodium chloride  0.9 % 100 mL IVPB        2 g 200 mL/hr over 30 Minutes Intravenous Every 12 hours 05/07/24 0024     05/06/24 2315  vancomycin  (VANCOCIN ) IVPB 1000 mg/200 mL premix        1,000  mg 200 mL/hr over 60 Minutes Intravenous  Once 05/06/24 2304 05/07/24 0328   05/06/24 2315  ceFEPIme (MAXIPIME) 2 g in sodium chloride  0.9 % 100 mL IVPB        2 g 200 mL/hr over 30 Minutes Intravenous  Once 05/06/24 2304 05/07/24 0123   05/06/24 1900  cefTRIAXone  (ROCEPHIN ) 1 g in sodium chloride  0.9 % 100 mL IVPB        1  g 200 mL/hr over 30 Minutes Intravenous  Once 05/06/24 1856 05/06/24 2143       Procedures:   Consultants:

## 2024-05-08 NOTE — Assessment & Plan Note (Addendum)
 05/08/24 BP stable. On Toprol -XL 25 mg daily.  05/09/24

## 2024-05-09 DIAGNOSIS — I10 Essential (primary) hypertension: Secondary | ICD-10-CM | POA: Diagnosis not present

## 2024-05-09 DIAGNOSIS — J410 Simple chronic bronchitis: Secondary | ICD-10-CM | POA: Diagnosis not present

## 2024-05-09 DIAGNOSIS — J189 Pneumonia, unspecified organism: Secondary | ICD-10-CM | POA: Diagnosis not present

## 2024-05-09 DIAGNOSIS — M069 Rheumatoid arthritis, unspecified: Secondary | ICD-10-CM

## 2024-05-09 DIAGNOSIS — R4701 Aphasia: Secondary | ICD-10-CM | POA: Diagnosis not present

## 2024-05-09 LAB — BLOOD GAS, VENOUS
Acid-base deficit: 6.2 mmol/L — ABNORMAL HIGH (ref 0.0–2.0)
Bicarbonate: 19 mmol/L — ABNORMAL LOW (ref 20.0–28.0)
O2 Saturation: 42.7 %
Patient temperature: 36.3
pCO2, Ven: 35 mmHg — ABNORMAL LOW (ref 44–60)
pH, Ven: 7.34 (ref 7.25–7.43)
pO2, Ven: <31 mmHg — CL (ref 32–45)

## 2024-05-09 LAB — COMPREHENSIVE METABOLIC PANEL WITH GFR
ALT: 19 U/L (ref 0–44)
AST: 26 U/L (ref 15–41)
Albumin: 3 g/dL — ABNORMAL LOW (ref 3.5–5.0)
Alkaline Phosphatase: 54 U/L (ref 38–126)
Anion gap: 15 (ref 5–15)
BUN: 9 mg/dL (ref 8–23)
CO2: 14 mmol/L — ABNORMAL LOW (ref 22–32)
Calcium: 9.4 mg/dL (ref 8.9–10.3)
Chloride: 111 mmol/L (ref 98–111)
Creatinine, Ser: 1.11 mg/dL — ABNORMAL HIGH (ref 0.44–1.00)
GFR, Estimated: 52 mL/min — ABNORMAL LOW (ref >60)
Glucose, Bld: 74 mg/dL (ref 70–99)
Potassium: 4 mmol/L (ref 3.5–5.1)
Sodium: 140 mmol/L (ref 135–145)
Total Bilirubin: 0.9 mg/dL (ref 0.0–1.2)
Total Protein: 6.5 g/dL (ref 6.5–8.1)

## 2024-05-09 LAB — BASIC METABOLIC PANEL WITH GFR
Anion gap: 15 (ref 5–15)
BUN: 9 mg/dL (ref 8–23)
CO2: 13 mmol/L — ABNORMAL LOW (ref 22–32)
Calcium: 9.4 mg/dL (ref 8.9–10.3)
Chloride: 111 mmol/L (ref 98–111)
Creatinine, Ser: 1.02 mg/dL — ABNORMAL HIGH (ref 0.44–1.00)
GFR, Estimated: 57 mL/min — ABNORMAL LOW (ref >60)
Glucose, Bld: 86 mg/dL (ref 70–99)
Potassium: 4.1 mmol/L (ref 3.5–5.1)
Sodium: 139 mmol/L (ref 135–145)

## 2024-05-09 LAB — CBC WITH DIFFERENTIAL/PLATELET
Abs Immature Granulocytes: 0.11 K/uL — ABNORMAL HIGH (ref 0.00–0.07)
Basophils Absolute: 0 K/uL (ref 0.0–0.1)
Basophils Relative: 0 %
Eosinophils Absolute: 0 K/uL (ref 0.0–0.5)
Eosinophils Relative: 0 %
HCT: 35.8 % — ABNORMAL LOW (ref 36.0–46.0)
Hemoglobin: 11.6 g/dL — ABNORMAL LOW (ref 12.0–15.0)
Immature Granulocytes: 1 %
Lymphocytes Relative: 22 %
Lymphs Abs: 3.7 K/uL (ref 0.7–4.0)
MCH: 28.8 pg (ref 26.0–34.0)
MCHC: 32.4 g/dL (ref 30.0–36.0)
MCV: 88.8 fL (ref 80.0–100.0)
Monocytes Absolute: 1.2 K/uL — ABNORMAL HIGH (ref 0.1–1.0)
Monocytes Relative: 7 %
Neutro Abs: 11.8 K/uL — ABNORMAL HIGH (ref 1.7–7.7)
Neutrophils Relative %: 70 %
Platelets: 175 K/uL (ref 150–400)
RBC: 4.03 MIL/uL (ref 3.87–5.11)
RDW: 13.8 % (ref 11.5–15.5)
WBC: 16.8 K/uL — ABNORMAL HIGH (ref 4.0–10.5)
nRBC: 0 % (ref 0.0–0.2)

## 2024-05-09 MED ORDER — AZITHROMYCIN 500 MG PO TABS: 500.0000 mg | ORAL_TABLET | Freq: Every day | ORAL | 0 refills | Status: AC

## 2024-05-09 MED ORDER — CEFADROXIL 500 MG PO CAPS: 500.0000 mg | ORAL_CAPSULE | Freq: Two times a day (BID) | ORAL | 0 refills | Status: AC

## 2024-05-09 MED ORDER — METOPROLOL SUCCINATE ER 50 MG PO TB24
50.0000 mg | ORAL_TABLET | Freq: Every day | ORAL | 0 refills | Status: DC
Start: 2024-05-09 — End: 2024-06-22

## 2024-05-09 MED ORDER — METOPROLOL SUCCINATE ER 25 MG PO TB24: 25.0000 mg | ORAL_TABLET | Freq: Once | ORAL | Status: DC

## 2024-05-09 MED ORDER — METOPROLOL SUCCINATE ER 25 MG PO TB24: 50.0000 mg | ORAL_TABLET | Freq: Once | ORAL | Status: DC

## 2024-05-09 MED ORDER — SACCHAROMYCES BOULARDII 250 MG PO CAPS: 250.0000 mg | ORAL_CAPSULE | Freq: Two times a day (BID) | ORAL | 0 refills | Status: AC

## 2024-05-09 MED ORDER — METOPROLOL SUCCINATE ER 25 MG PO TB24
25.0000 mg | ORAL_TABLET | Freq: Once | ORAL | Status: AC
  Administered 2024-05-09: 25 mg via ORAL
  Filled 2024-05-09: qty 1

## 2024-05-09 MED ORDER — POTASSIUM CHLORIDE CRYS ER 10 MEQ PO TBCR: 10.0000 meq | EXTENDED_RELEASE_TABLET | Freq: Every day | ORAL | Status: DC

## 2024-05-09 MED ORDER — METOPROLOL SUCCINATE ER 25 MG PO TB24: 50.0000 mg | ORAL_TABLET | Freq: Every day | ORAL | Status: DC

## 2024-05-09 NOTE — Plan of Care (Signed)
  Problem: Education: Goal: Knowledge of General Education information will improve Description: Including pain rating scale, medication(s)/side effects and non-pharmacologic comfort measures Outcome: Progressing   Problem: Activity: Goal: Risk for activity intolerance will decrease Outcome: Progressing   Problem: Nutrition: Goal: Adequate nutrition will be maintained Outcome: Progressing   Problem: Coping: Goal: Level of anxiety will decrease Outcome: Progressing   Problem: Safety: Goal: Ability to remain free from injury will improve Outcome: Progressing   Problem: Respiratory: Goal: Ability to maintain adequate ventilation will improve Outcome: Progressing

## 2024-05-09 NOTE — TOC Transition Note (Addendum)
 Transition of Care [facility]) - Discharge Note   Patient Details  Name: Betty Golden MRN: [id number] Date of Birth: June 29, 1946  [facility] CM/SW Contact:  Robynn Eileen Hoose, RN Phone Number: 05/09/2024, 3:27 PM   Clinical Narrative:   Patient with possible discharge today. Per Bamboo, pt is currently with [facility] for [facility] services. Message to [facility] with [facility] to give update.  1535: Daughter at bedside confirmed that pt has rolling walker at home.    Final next level of care: Home w Home Health Services     Patient Goals and CMS Choice            Discharge Placement                       Discharge Plan and Services Additional resources added to the After Visit Summary for                                       Social Drivers of Health (SDOH) Interventions SDOH Screenings   Food Insecurity: No Food Insecurity (05/07/2024)  Housing: Low Risk  (05/07/2024)  Transportation Needs: No Transportation Needs (05/07/2024)  Utilities: Not At Risk (05/07/2024)  Financial Resource Strain: Low Risk  (04/04/2019)  Physical Activity: Inactive (04/04/2019)  Social Connections: Unknown (05/07/2024)  Recent Concern: Social Connections - Socially Isolated (02/27/2024)  Stress: No Stress Concern Present (04/04/2019)  Tobacco Use: Medium Risk (05/06/2024)     Readmission Risk Interventions     No data to display

## 2024-05-09 NOTE — Discharge Summary (Signed)
 [facility] Physician Discharge Summary   Patient name: Betty Golden  Admit date:     05/06/2024  Discharge date: 05/09/2024  Attending Physician: SUNDIL, SUBRINA [[id number]]  Discharge Physician: Camellia Door   PCP: Irven Ozell DEL, MD  Admitted From: Home  Disposition:  Home  Recommendations for Outpatient Follow-up:  Follow up with PCP in 1-2 weeks  Home Health:Yes. Home health PT Equipment/Devices: rolling walker    Discharge Condition:Stable CODE STATUS:FULL Diet recommendation: Heart Healthy Fluid Restriction: None  Hospital Summary: CC: chest pain, palpitations,  HPI: Betty Golden is a 78 y.o. 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  Patient reported not feeling well for last 2 days.  Reported midepigastric pain that radiates to the chest with associated heartburn.  Patient also reported chest pain with cough and deep breathing as well.  Reporting exertional dyspnea.  Patient unable to ambulate very short distance due to significant dyspnea which is new to per patient's family. Patient denies any fever, chill, nausea, vomiting, UTI symptoms URI symptoms.  Family reported patient is not eating or drinking well.  Patient has been recently discharged from rehab from recent admission from CVA.   During my potation at bedside patient is stating that she developed chest pain with coughing and shortness of breath.   Patient reported midepigastric pain that get worsen with eating food which radiates to lower chest as well. Patient does not have any other complaint at this time.   ED Course:  At presentation to ED patient found tachycardic heart rate 122, tachypneic 31 and borderline hypertensive.  O2 sat 100% room air.  Afebrile.   EKG showing sinus tachycardia heart rate 124. Lab work,  BMP shows low potassium 3.3, low bicarb 18, elevated creatinine 1.19, elevated anion gap 17 and GFR 47. CBC showing leukocytosis 18.3 otherwise unremarkable. Flat troponin 38 and 36. Blood cultures are in process. Low mag 1.6. Normal TSH and T4. Elevated lactic acid 2 improved to 1.9. VBG unremarkable.   Pending respiratory panel.   CTA chest no evidence of pulmonary embolism.  Prominent emphysematous changes of the lung.  Multifocal pneumonia.  Aortic atherosclerosis.   Chest x-ray showing no acute disease process.   In the ED patient received aspirin  324 mg in the setting of chest pain, cefepime, ceftriaxone , Ativan , 2 L of LR bolus, vancomycin  and famotidine .     Hospitalist has been consulted for further evaluation management of sepsis in the setting of community-acquired pneumonia, pleuritic chest pain due to pneumonia, elevated troponin secondary to demand ischemia, hypokalemia, hypomagnesemia and acute kidney injury.  Significant Events: Admitted 05/06/2024 for pneumonia   Admission Labs: WBC 18.3, HgB 12.9, plt 213 Na 138, K 3.3, CO2 of 18, BUN 11, scr 1.19, glu 119 VBG pH 7.4, PCO2 of 38 Lactic acid 2.0 TSH 2.62, FT4 of 1.12 Mg 1.6 Procalcitonin < 0.10 UA hazy, spg 1.031, HgB moderate, LE large, WBC 11-20, rare bacteria Covid/rsv/flu negative  Admission Imaging Studies: CXR No acute process.  CTPA No evidence of significant pulmonary embolus. 2. Prominent emphysematous changes throughout the lungs. 3. Patchy ground-glass infiltrates in the lungs likely represent multifocal pneumonia. 4. Mild aortic atherosclerosis  Significant Labs: Strep Pneumo antigen negative Resp Viral Panel Negative Legionella antigen negative  Significant Imaging Studies:   Antibiotic Therapy: Anti-infectives (From admission, onward)    Start     Dose/Rate Route Frequency Ordered  Stop   05/07/24 2000  vancomycin  (VANCOREADY) IVPB 750 mg/150 mL        750 mg 150 mL/hr over 60 Minutes  Intravenous Every 24 hours 05/07/24 0024     05/07/24 1200  ceFEPIme (MAXIPIME) 2 g in sodium chloride  0.9 % 100 mL IVPB        2 g 200 mL/hr over 30 Minutes Intravenous Every 12 hours 05/07/24 0024     05/06/24 2315  vancomycin  (VANCOCIN ) IVPB 1000 mg/200 mL premix        1,000 mg 200 mL/hr over 60 Minutes Intravenous  Once 05/06/24 2304 05/07/24 0328   05/06/24 2315  ceFEPIme (MAXIPIME) 2 g in sodium chloride  0.9 % 100 mL IVPB        2 g 200 mL/hr over 30 Minutes Intravenous  Once 05/06/24 2304 05/07/24 0123   05/06/24 1900  cefTRIAXone  (ROCEPHIN ) 1 g in sodium chloride  0.9 % 100 mL IVPB        1 g 200 mL/hr over 30 Minutes Intravenous  Once 05/06/24 1856 05/06/24 2143       Procedures:   Consultants:    Hospital Course by Problem: * Sepsis due to pneumonia (HCC) 05/08/24 present on admission. Meets criteria on admission. WBC 18.3, HR 123, temp 102.3. stop cefepime/vanco. Change to rocephin /zithromax . Can go home on duricef/zithromax . Start probiotics. On RA. Afebrile for 24 hours. Strep pneumo antigen negative. Resp Viral panel negative. WBC down to 16.9. repeat CBC in AM. Procal < 0.10  05/09/24 awaiting PT evaluation. Can do home with duricef and zithromax . On RA. Continue probiotic at home.   *update. Discussed with PT. Pt walked 100 feet with RW. Will order home DME Rolling walker in case she does not have one at home. And home health PT.  Pleuritic chest pain-resolved as of 05/09/2024 05/08/24 due to pneumonia.  05/09/24 resolved.  Elevated troponin-resolved as of 05/09/2024 05/08/24 troponin flat. Likely demand ischemia from sepsis. NOT NSTEMI or ACS.  05/09/24 resolved.  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 Stable.  05/09/24 chronic. Stable. Due to prior CVA.  Rheumatoid arthritis (HCC) 05/08/24 stable.  05/09/24 stable. F/u with PCP  COPD (chronic obstructive pulmonary disease) (HCC) 05/08/24 stable. On inhalers and prn nebs. Not  exacerbated. On RA. No wheezing.  05/09/24 stable. On RA. Continue inhalers at home. Not exacerbated. Does not need steroids.  Essential hypertension 05/08/24 BP stable. On Toprol -XL 25 mg daily.  05/09/24    Hypomagnesemia-resolved as of 05/08/2024 05/08/24 admission Mg of 1.6, repleted. Resolved.  Hypokalemia-resolved as of 05/09/2024 05/08/24 replete with po kcl. Repeat BMP in AM.  05/09/24 resolved. K 4.1 today.    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   Discharge Instructions  Discharge Instructions     Call MD for:  difficulty breathing, headache or visual disturbances   Complete by: As directed    Call MD for:  extreme fatigue   Complete by: As directed    Call MD for:  hives   Complete by: As directed    Call MD for:  persistant dizziness or light-headedness   Complete by: As directed    Call MD for:  persistant nausea and vomiting   Complete by: As directed    Call MD for:  redness, tenderness, or signs of  infection (pain, swelling, redness, odor or green/yellow discharge around incision site)   Complete by: As directed    Call MD for:  severe uncontrolled pain   Complete by: As directed    Call MD for:  temperature >100.4   Complete by: As directed    Diet - low sodium heart healthy   Complete by: As directed    Discharge instructions   Complete by: As directed    1. Follow up with your primary care provider in 1-2 weeks following discharge from hospital.   Increase activity slowly   Complete by: As directed       Allergies as of 05/09/2024       Reactions   Hydromorphone  Anaphylaxis   Sulfa Antibiotics Rash        Medication List     STOP taking these medications    fluticasone  50 MCG/ACT nasal spray Commonly known as: FLONASE     Restasis  0.05 % ophthalmic emulsion Generic drug: cycloSPORINE        TAKE these medications    acetaminophen  325 MG tablet Commonly known as: TYLENOL  Take 1-2 tablets (325-650 mg total) by mouth every 4 (four) hours as needed for mild pain (pain score 1-3). What changed:  how much to take when to take this reasons to take this   albuterol  108 (90 Base) MCG/ACT inhaler Commonly known as: ProAir  HFA Inhale 2 puffs into the lungs every 4 (four) hours as needed for wheezing or shortness of breath.   amLODipine  10 MG tablet Commonly known as: NORVASC  Take 1 tablet (10 mg total) by mouth daily.   azithromycin  500 MG tablet Commonly known as: ZITHROMAX  Take 1 tablet (500 mg total) by mouth daily for 4 days. Start taking on: May 10, 2024   cefadroxil 500 MG capsule Commonly known as: DURICEF Take 1 capsule (500 mg total) by mouth 2 (two) times daily for 7 days.   CIMZIA  Inject 1 Dose into the skin every 14 (fourteen) days.   clindamycin 1 % lotion Commonly known as: CLEOCIN T Apply 1 Application topically 2 (two) times daily.   clopidogrel  75 MG tablet Commonly known as: PLAVIX  Take 1 tablet (75 mg total) by mouth daily.   DULoxetine  30 MG capsule Commonly known as: CYMBALTA  Take 1 capsule (30 mg total) by mouth 2 (two) times daily.   Fasenra  30 MG/ML prefilled syringe Generic drug: benralizumab  Inject 1 mL (30 mg total) into the skin every 8 (eight) weeks.   ferrous sulfate 325 (65 FE) MG EC tablet Take 325 mg by mouth daily.   leflunomide  20 MG tablet Commonly known as: ARAVA  Take 20 mg by mouth daily.   melatonin 5 MG Tabs Take 1 tablet (5 mg total) by mouth at bedtime.   metoprolol  succinate 50 MG 24 hr tablet Commonly known as: Toprol  XL Take 1 tablet (50 mg total) by mouth daily. What changed:  medication strength how much to take   pantoprazole  40 MG tablet Commonly known as: PROTONIX  Take 1 tablet (40 mg total) by mouth daily.    potassium chloride  10 MEQ tablet Commonly known as: KLOR-CON  M Take 1 tablet (10 mEq total) by mouth daily.   rosuvastatin  20 MG tablet Commonly known as: CRESTOR  Take 1 tablet (20 mg total) by mouth daily.   saccharomyces boulardii 250 MG capsule Commonly known as: FLORASTOR Take 1 capsule (250 mg total) by mouth 2 (two) times daily.   traZODone  50 MG tablet Commonly known as: DESYREL  Take 0.5 tablets (  25 mg total) by mouth at bedtime.   Trelegy Ellipta  200-62.5-25 MCG/ACT Aepb Generic drug: Fluticasone -Umeclidin-Vilant Inhale 1 puff into the lungs in the morning. What changed: how much to take               Durable Medical Equipment  (From admission, onward)           Start     Ordered   05/09/24 1521  For home use only DME Walker rolling  Once       Question Answer Comment  Walker: With 5 Inch Wheels   Patient needs a walker to treat with the following condition Debility      05/09/24 1520            Allergies  Allergen Reactions   Hydromorphone  Anaphylaxis   Sulfa Antibiotics Rash    Discharge Exam: Vitals:   05/09/24 0836 05/09/24 1041  BP: (!) 180/93 (!) 158/84  Pulse: (!) 108 100  Resp: 19   Temp: 98.2 F (36.8 C)   SpO2: 100%     Physical Exam Vitals and nursing note reviewed.  Constitutional:      General: She is not in acute distress.    Appearance: She is not toxic-appearing or diaphoretic.  HENT:     Head: Normocephalic and atraumatic.  Cardiovascular:     Rate and Rhythm: Normal rate and regular rhythm.  Pulmonary:     Effort: Pulmonary effort is normal. No respiratory distress.     Breath sounds: Normal breath sounds. No wheezing or rales.  Abdominal:     General: Abdomen is flat. Bowel sounds are normal. There is no distension.     Palpations: Abdomen is soft.  Musculoskeletal:     Right lower leg: No edema.     Left lower leg: No edema.  Skin:    General: Skin is warm and dry.     Capillary Refill: Capillary  refill takes less than 2 seconds.  Neurological:     Mental Status: She is alert.     Comments: +expressive aphasia     The results of significant diagnostics from this hospitalization (including imaging, microbiology, ancillary and laboratory) are listed below for reference.    Microbiology: Recent Results (from the past 240 hours)  Blood culture (routine x 2)     Status: None (Preliminary result)   Collection Time: 05/06/24  8:10 PM   Specimen: BLOOD RIGHT ARM  Result Value Ref Range Status   Specimen Description BLOOD RIGHT ARM  Final   Special Requests   Final    BOTTLES DRAWN AEROBIC AND ANAEROBIC Blood Culture adequate volume   Culture   Final    NO GROWTH 3 DAYS Performed at [facility], [address] [place], [place] [address]    Report Status PENDING  Incomplete  Blood culture (routine x 2)     Status: None (Preliminary result)   Collection Time: 05/06/24 10:13 PM   Specimen: BLOOD  Result Value Ref Range Status   Specimen Description BLOOD SITE NOT SPECIFIED  Final   Special Requests   Final    BOTTLES DRAWN AEROBIC ONLY Blood Culture results may not be optimal due to an inadequate volume of blood received in culture bottles   Culture   Final    NO GROWTH 3 DAYS Performed at [facility], [address] [place], [place] [address]    Report Status PENDING  Incomplete  Resp panel by RT-PCR (RSV, Flu A&B, Covid)  Urine, Clean Catch     Status: None   Collection Time: 05/07/24 12:20 AM   Specimen: Urine, Clean Catch; Nasal Swab  Result Value Ref Range Status   SARS Coronavirus 2 by RT PCR NEGATIVE NEGATIVE Final   Influenza A by PCR NEGATIVE NEGATIVE Final   Influenza B by PCR NEGATIVE NEGATIVE Final    Comment: (NOTE) The Xpert Xpress SARS-CoV-2/FLU/RSV plus assay is intended as an aid in the diagnosis of influenza from Nasopharyngeal swab specimens and should not be used as a sole basis for treatment. Nasal washings and aspirates are unacceptable  for Xpert Xpress SARS-CoV-2/FLU/RSV testing.  Fact Sheet for Patients: BloggerCourse.com  Fact Sheet for Healthcare Providers: SeriousBroker.it  This test is not yet approved or cleared by the [place]  FDA and has been authorized for detection and/or diagnosis of SARS-CoV-2 by FDA under an Emergency Use Authorization (EUA). This EUA will remain in effect (meaning this test can be used) for the duration of the COVID-19 declaration under Section 564(b)(1) of the Act, 21 U.S.C. section 360bbb-3(b)(1), unless the authorization is terminated or revoked.     Resp Syncytial Virus by PCR NEGATIVE NEGATIVE Final    Comment: (NOTE) Fact Sheet for Patients: BloggerCourse.com  Fact Sheet for Healthcare Providers: SeriousBroker.it  This test is not yet approved or cleared by the [place]  FDA and has been authorized for detection and/or diagnosis of SARS-CoV-2 by FDA under an Emergency Use Authorization (EUA). This EUA will remain in effect (meaning this test can be used) for the duration of the COVID-19 declaration under Section 564(b)(1) of the Act, 21 U.S.C. section 360bbb-3(b)(1), unless the authorization is terminated or revoked.  Performed at [facility], [address] [place], [place] [address]   Respiratory (~20 pathogens) panel by PCR     Status: None   Collection Time: 05/07/24  7:42 AM   Specimen: Nasopharyngeal Swab; Respiratory  Result Value Ref Range Status   Adenovirus NOT DETECTED NOT DETECTED Final   Coronavirus 229E NOT DETECTED NOT DETECTED Final    Comment: (NOTE) The Coronavirus on the Respiratory Panel, DOES NOT test for the novel  Coronavirus (2019 nCoV)    Coronavirus HKU1 NOT DETECTED NOT DETECTED Final   Coronavirus NL63 NOT DETECTED NOT DETECTED Final   Coronavirus OC43 NOT DETECTED NOT DETECTED Final   Metapneumovirus NOT DETECTED NOT  DETECTED Final   Rhinovirus / Enterovirus NOT DETECTED NOT DETECTED Final   Influenza A NOT DETECTED NOT DETECTED Final   Influenza B NOT DETECTED NOT DETECTED Final   Parainfluenza Virus 1 NOT DETECTED NOT DETECTED Final   Parainfluenza Virus 2 NOT DETECTED NOT DETECTED Final   Parainfluenza Virus 3 NOT DETECTED NOT DETECTED Final   Parainfluenza Virus 4 NOT DETECTED NOT DETECTED Final   Respiratory Syncytial Virus NOT DETECTED NOT DETECTED Final   Bordetella pertussis NOT DETECTED NOT DETECTED Final   Bordetella Parapertussis NOT DETECTED NOT DETECTED Final   Chlamydophila pneumoniae NOT DETECTED NOT DETECTED Final   Mycoplasma pneumoniae NOT DETECTED NOT DETECTED Final    Comment: Performed at [facility], [address] [place], [place] [address]  MRSA Next Gen by PCR, Nasal     Status: None   Collection Time: 05/07/24  9:36 AM   Specimen: Nasopharyngeal Swab; Nasal Swab  Result Value Ref Range Status   MRSA by PCR Next Gen NOT DETECTED NOT DETECTED Final    Comment: (NOTE) The GeneXpert MRSA Assay (FDA approved for NASAL specimens only),  is one component of a comprehensive MRSA colonization surveillance program. It is not intended to diagnose MRSA infection nor to guide or monitor treatment for MRSA infections. Test performance is not FDA approved in patients less than 64 years old. Performed at [facility], [address], [place], [place] [address]      Labs:  Basic Metabolic Panel: Recent Labs  Lab 05/06/24 1741 05/06/24 2010 05/06/24 2023 05/07/24 0237 05/08/24 0749 05/09/24 0531 05/09/24 1203  NA 138  --  142 140 139 140 139  K 3.3*  --  2.8* 3.3* 3.1* 4.0 4.1  CL 103  --   --  110 110 111 111  CO2 18*  --   --  19* 18* 14* 13*  GLUCOSE 119*  --   --  113* 83 74 86  BUN 11  --   --  11 9 9 9   CREATININE 1.19*  --   --  1.05* 1.16* 1.11* 1.02*  CALCIUM  9.6  --   --  8.4* 8.9 9.4 9.4  MG  --  1.6*  --   --  1.7  --   --    Liver Function  Tests: Recent Labs  Lab 05/07/24 0237 05/09/24 0531  AST 24 26  ALT 19 19  ALKPHOS 42 54  BILITOT 0.6 0.9  PROT 6.1* 6.5  ALBUMIN 3.3* 3.0*   CBC: Recent Labs  Lab 05/06/24 1741 05/06/24 2023 05/07/24 0237 05/08/24 0749 05/09/24 0531  WBC 18.3*  --  14.2* 16.9* 16.8*  NEUTROABS  --   --   --  12.7* 11.8*  HGB 12.9 12.6 11.8* 11.0* 11.6*  HCT 38.3 37.0 35.8* 34.4* 35.8*  MCV 84.4  --  86.3 87.5 88.8  PLT 213  --  182 166 175   Cardiac Enzymes: Recent Labs  Lab 05/06/24 1741 05/06/24 2010  TROPONINIHS 38* 36*   CBG: Recent Labs  Lab 05/08/24 0533  GLUCAP 75   Thyroid function studies Recent Labs    05/06/24 2010  TSH 2.627  FREET4 1.12   Urinalysis    Component Value Date/Time   COLORURINE YELLOW 05/07/2024 0237   APPEARANCEUR HAZY (A) 05/07/2024 0237   LABSPEC 1.031 (H) 05/07/2024 0237   PHURINE 6.0 05/07/2024 0237   GLUCOSEU NEGATIVE 05/07/2024 0237   HGBUR MODERATE (A) 05/07/2024 0237   BILIRUBINUR NEGATIVE 05/07/2024 0237   KETONESUR NEGATIVE 05/07/2024 0237   PROTEINUR NEGATIVE 05/07/2024 0237   NITRITE NEGATIVE 05/07/2024 0237   LEUKOCYTESUR LARGE (A) 05/07/2024 0237   Sepsis Labs Recent Labs  Lab 05/06/24 1741 05/07/24 0020 05/07/24 0237 05/08/24 0749 05/09/24 0531  PROCALCITON  --  <0.10  --   --   --   WBC 18.3*  --  14.2* 16.9* 16.8*    Procedures/Studies: ECHOCARDIOGRAM COMPLETE Result Date: 05/07/2024    ECHOCARDIOGRAM REPORT   Patient Name:   MONISHA SIEBEL Date of Exam: 05/07/2024 Medical Rec #:  [id number]         Height:       63.0 in Accession #:    [id number]        Weight:       152.1 lb Date of Birth:  04-20-1946         BSA:          1.721 m Patient Age:    78 years          BP:           153/77  mmHg Patient Gender: F                 HR:           111 bpm. Exam Location:  Inpatient Procedure: 2D Echo (Both Spectral and Color Flow Doppler were utilized during            procedure). Indications:    Elevated Troponin   History:        Patient has prior history of Echocardiogram examinations.                 Signs/Symptoms:Elevated Troponin.  Sonographer:    Norleen Amour Referring Phys: [id number] SUBRINA SUNDIL IMPRESSIONS  1. Left ventricular ejection fraction, by estimation, is 70 to 75%. The left ventricle has hyperdynamic function. The left ventricle has no regional wall motion abnormalities. Left ventricular diastolic parameters are consistent with Grade I diastolic dysfunction (impaired relaxation).  2. Right ventricular systolic function is normal. The right ventricular size is normal.  3. There is abnormal motion on the ventricular aspect of the valve, near the severe MAC (see image 52). Unclear if this is subvalvular apparatus or other structure. Not typical location of vegetation, but etiology unclear. The mitral valve was not well visualized. Trivial mitral valve regurgitation. No evidence of mitral stenosis. Severe mitral annular calcification.  4. The aortic valve is grossly normal. There is mild calcification of the aortic valve. There is mild thickening of the aortic valve. Aortic valve regurgitation is not visualized. Comparison(s): Changes from prior study are noted. Conclusion(s)/Recommendation(s): See comments re: abnormal motion below the mitral valve annulus. While this is not typical location for vegetation, if there is clinical concern for endocarditis, would recommend TEE for further evaluation. FINDINGS  Left Ventricle: Left ventricular ejection fraction, by estimation, is 70 to 75%. The left ventricle has hyperdynamic function. The left ventricle has no regional wall motion abnormalities. The left ventricular internal cavity size was normal in size. There is no left ventricular hypertrophy. Left ventricular diastolic parameters are consistent with Grade I diastolic dysfunction (impaired relaxation). Right Ventricle: The right ventricular size is normal. Right vetricular wall thickness was not well  visualized. Right ventricular systolic function is normal. Left Atrium: Left atrial size was normal in size. Right Atrium: Right atrial size was normal in size. Pericardium: Trivial pericardial effusion is present. Mitral Valve: There is abnormal motion on the ventricular aspect of the valve, near the severe MAC (see image 52). Unclear if this is subvalvular apparatus or other structure. Not typical location of vegetation, but etiology unclear. The mitral valve was  not well visualized. Severe mitral annular calcification. Trivial mitral valve regurgitation. No evidence of mitral valve stenosis. MV peak gradient, 16.3 mmHg. The mean mitral valve gradient is 7.0 mmHg. Tricuspid Valve: The tricuspid valve is grossly normal. Tricuspid valve regurgitation is trivial. No evidence of tricuspid stenosis. Aortic Valve: The aortic valve is grossly normal. There is mild calcification of the aortic valve. There is mild thickening of the aortic valve. Aortic valve regurgitation is not visualized. Pulmonic Valve: The pulmonic valve was not well visualized. Pulmonic valve regurgitation is not visualized. No evidence of pulmonic stenosis. Aorta: The aortic root, ascending aorta, aortic arch and descending aorta are all structurally normal, with no evidence of dilitation or obstruction. Venous: The inferior vena cava was not well visualized. IAS/Shunts: The atrial septum is grossly normal.  LEFT VENTRICLE PLAX 2D LVIDd:         3.70 cm     Diastology LVIDs:  2.10 cm     LV e' medial:  8.70 cm/s LV PW:         0.90 cm     LV e' lateral: 7.30 cm/s LV IVS:        0.80 cm LVOT diam:     1.80 cm LV SV:         57 LV SV Index:   33 LVOT Area:     2.54 cm  LV Volumes (MOD) LV vol d, MOD A2C: 37.7 ml LV vol d, MOD A4C: 44.2 ml LV vol s, MOD A2C: 14.1 ml LV vol s, MOD A4C: 18.5 ml LV SV MOD A2C:     23.6 ml LV SV MOD A4C:     44.2 ml LV SV MOD BP:      26.2 ml RIGHT VENTRICLE RV Basal diam:  2.20 cm RV S prime:     12.10 cm/s TAPSE  (M-mode): 1.8 cm LEFT ATRIUM             Index        RIGHT ATRIUM          Index LA diam:        4.70 cm 2.73 cm/m   RA Area:     9.61 cm LA Vol (A2C):   25.2 ml 14.64 ml/m  RA Volume:   18.90 ml 10.98 ml/m LA Vol (A4C):   26.5 ml 15.40 ml/m LA Biplane Vol: 26.8 ml 15.57 ml/m  AORTIC VALVE             PULMONIC VALVE LVOT Vmax:   132.00 cm/s PV Vmax:       1.04 m/s LVOT Vmean:  97.600 cm/s PV Peak grad:  4.3 mmHg LVOT VTI:    0.223 m  AORTA Ao Root diam: 2.60 cm Ao Asc diam:  2.50 cm MITRAL VALVE              TRICUSPID VALVE MV Area VTI:  2.07 cm    TR Peak grad:   37.0 mmHg MV Peak grad: 16.3 mmHg   TR Vmax:        304.00 cm/s MV Mean grad: 7.0 mmHg MV Vmax:      2.02 m/s    SHUNTS MV Vmean:     124.0 cm/s  Systemic VTI:  0.22 m                           Systemic Diam: 1.80 cm Shelda Bruckner MD Electronically signed by Shelda Bruckner MD Signature Date/Time: 05/07/2024/11:41:39 AM    Final    CT Angio Chest PE W and/or Wo Contrast Result Date: 05/06/2024 CLINICAL DATA:  Pulmonary embolus suspected with high probability. Chest pain radiating to the left arm. History of cardiac issues. EXAM: CT ANGIOGRAPHY CHEST WITH CONTRAST TECHNIQUE: Multidetector CT imaging of the chest was performed using the standard protocol during bolus administration of intravenous contrast. Multiplanar CT image reconstructions and MIPs were obtained to evaluate the vascular anatomy. RADIATION DOSE REDUCTION: This exam was performed according to the departmental dose-optimization program which includes automated exposure control, adjustment of the mA and/or kV according to patient size and/or use of iterative reconstruction technique. CONTRAST:  75mL OMNIPAQUE  IOHEXOL  350 MG/ML SOLN COMPARISON:  Chest radiograph 05/06/2024.  CT chest 09/25/2021 FINDINGS: Cardiovascular: Technically adequate study with good opacification of the central and segmental pulmonary arteries. Mild motion artifact. No focal filling defects. No  evidence of significant pulmonary embolus. Normal  caliber thoracic aorta. No aortic dissection. Normal heart size. No pericardial effusions. Mild scattered aortic calcification. Mediastinum/Nodes: Esophagus is decompressed. No significant lymphadenopathy. Thyroid gland is unremarkable. Lungs/Pleura: Diffuse emphysematous changes throughout the lungs. Patchy ground-glass infiltrates bilaterally likely representing multifocal pneumonia or possibly edema. No pleural effusion or pneumothorax. Upper Abdomen: No acute abnormality. Musculoskeletal: Degenerative changes in the spine and shoulders. No acute bony abnormalities. Review of the MIP images confirms the above findings. IMPRESSION: 1. No evidence of significant pulmonary embolus. 2. Prominent emphysematous changes throughout the lungs. 3. Patchy ground-glass infiltrates in the lungs likely represent multifocal pneumonia. 4. Mild aortic atherosclerosis. Electronically Signed   By: Elsie Gravely M.D.   On: 05/06/2024 22:51   DG Chest Portable 1 View Result Date: 05/06/2024 EXAM: 1 VIEW XRAY OF THE CHEST 05/06/2024 07:54:00 PM COMPARISON: None available. CLINICAL HISTORY: dib, cap?. CP, SOB and palpitations. Recent stroke. FINDINGS: LUNGS AND PLEURA: No focal pulmonary opacity. No pulmonary edema. No pleural effusion. No pneumothorax. HEART AND MEDIASTINUM: No acute abnormality of the cardiac and mediastinal silhouettes. BONES AND SOFT TISSUES: Osseous structures are age appropriate. IMPRESSION: 1. No acute process. Electronically signed by: Dorethia Molt MD 05/06/2024 07:58 PM EDT RP Workstation: [id number]   ECHOCARDIOGRAM COMPLETE Result Date: 04/14/2024    ECHOCARDIOGRAM REPORT   Patient Name:   SERENNA DEROY Date of Exam: 04/14/2024 Medical Rec #:  [id number]         Height:       63.0 in Accession #:    [id number]        Weight:       156.5 lb Date of Birth:  04-15-1946         BSA:          1.742 m Patient Age:    78 years          BP:            153/94 mmHg Patient Gender: F                 HR:           108 bpm. Exam Location:  [facility] Procedure: 2D Echo, 3D Echo, Cardiac Doppler and Color Doppler (Both Spectral            and Color Flow Doppler were utilized during procedure). Indications:    Stroke I63.9  History:        Patient has prior history of Echocardiogram examinations, most                 recent 04/17/2019. Stroke. H/o Hypertension, Hyperlipidemia,                 Coronary artery disease.  Sonographer:    BERNARDA ROCKS Referring Phys: [id number] RYAN B GRUNZ IMPRESSIONS  1. Left ventricular ejection fraction, by estimation, is 70 to 75%. The left ventricle has hyperdynamic function. Left ventricular endocardial border not optimally defined to evaluate regional wall motion. Left ventricular diastolic parameters are consistent with Grade I diastolic dysfunction (impaired relaxation). Elevated left atrial pressure.  2. Right ventricular systolic function is normal. The right ventricular size is normal. Tricuspid regurgitation signal is inadequate for assessing PA pressure.  3. The mitral valve is normal in structure. No evidence of mitral valve regurgitation. No evidence of mitral stenosis.  4. The aortic valve was not well visualized. Aortic valve regurgitation is not visualized. No aortic stenosis is present. Aortic valve area, by VTI measures 1.48 cm. Aortic valve mean gradient  measures 7.0 mmHg. Aortic valve Vmax measures 1.88 m/s.  5. The inferior vena cava is normal in size with greater than 50% respiratory variability, suggesting right atrial pressure of 3 mmHg. Conclusion(s)/Recommendation(s): No intracardiac source of embolism detected on this transthoracic study. Consider a transesophageal echocardiogram to exclude cardiac source of embolism if clinically indicated. FINDINGS  Left Ventricle: Left ventricular ejection fraction, by estimation, is 70 to 75%. The left ventricle has hyperdynamic function. Left ventricular endocardial border not  optimally defined to evaluate regional wall motion. The left ventricular internal cavity size was normal in size. There is no left ventricular hypertrophy. Left ventricular diastolic parameters are consistent with Grade I diastolic dysfunction (impaired relaxation). Elevated left atrial pressure. Right Ventricle: The right ventricular size is normal. No increase in right ventricular wall thickness. Right ventricular systolic function is normal. Tricuspid regurgitation signal is inadequate for assessing PA pressure. Left Atrium: Left atrial size was normal in size. Right Atrium: Right atrial size was normal in size. Pericardium: There is no evidence of pericardial effusion. Mitral Valve: The mitral valve is normal in structure. Mild to moderate mitral annular calcification. No evidence of mitral valve regurgitation. No evidence of mitral valve stenosis. MV peak gradient, 16.0 mmHg. The mean mitral valve gradient is 6.0 mmHg. Tricuspid Valve: The tricuspid valve is normal in structure. Tricuspid valve regurgitation is not demonstrated. No evidence of tricuspid stenosis. Aortic Valve: The aortic valve was not well visualized. Aortic valve regurgitation is not visualized. No aortic stenosis is present. Aortic valve mean gradient measures 7.0 mmHg. Aortic valve peak gradient measures 14.1 mmHg. Aortic valve area, by VTI measures 1.48 cm. Pulmonic Valve: The pulmonic valve was not well visualized. Pulmonic valve regurgitation is not visualized. No evidence of pulmonic stenosis. Aorta: The aortic root is normal in size and structure. Venous: The inferior vena cava is normal in size with greater than 50% respiratory variability, suggesting right atrial pressure of 3 mmHg. IAS/Shunts: No atrial level shunt detected by color flow Doppler.  LEFT VENTRICLE PLAX 2D LVIDd:         4.50 cm     Diastology LVIDs:         2.70 cm     LV e' medial:    8.02 cm/s LV PW:         0.90 cm     LV E/e' medial:  15.0 LV IVS:        0.90 cm      LV e' lateral:   8.58 cm/s LVOT diam:     1.40 cm     LV E/e' lateral: 14.0 LV SV:         41 LV SV Index:   24 LVOT Area:     1.54 cm  LV Volumes (MOD) LV vol d, MOD A2C: 69.4 ml LV vol d, MOD A4C: 66.9 ml LV vol s, MOD A2C: 20.5 ml LV vol s, MOD A4C: 19.6 ml LV SV MOD A2C:     48.9 ml LV SV MOD A4C:     66.9 ml LV SV MOD BP:      48.5 ml RIGHT VENTRICLE             IVC RV Basal diam:  2.40 cm     IVC diam: 1.30 cm RV S prime:     18.30 cm/s TAPSE (M-mode): 2.6 cm LEFT ATRIUM             Index        RIGHT ATRIUM  Index LA diam:        3.80 cm 2.18 cm/m   RA Area:     7.42 cm LA Vol (A2C):   36.0 ml 20.66 ml/m  RA Volume:   10.40 ml 5.97 ml/m LA Vol (A4C):   46.8 ml 26.86 ml/m LA Biplane Vol: 43.3 ml 24.85 ml/m  AORTIC VALVE                     PULMONIC VALVE AV Area (Vmax):    1.11 cm      PV Vmax:          1.77 m/s AV Area (Vmean):   1.24 cm      PV Peak grad:     12.5 mmHg AV Area (VTI):     1.48 cm      PR End Diast Vel: 2.94 msec AV Vmax:           188.00 cm/s AV Vmean:          120.000 cm/s AV VTI:            0.278 m AV Peak Grad:      14.1 mmHg AV Mean Grad:      7.0 mmHg LVOT Vmax:         135.00 cm/s LVOT Vmean:        96.900 cm/s LVOT VTI:          0.267 m LVOT/AV VTI ratio: 0.96  AORTA Ao Root diam: 2.20 cm Ao Asc diam:  2.50 cm MITRAL VALVE MV Area (PHT): 6.90 cm     SHUNTS MV Area VTI:   1.54 cm     Systemic VTI:  0.27 m MV Peak grad:  16.0 mmHg    Systemic Diam: 1.40 cm MV Mean grad:  6.0 mmHg MV Vmax:       2.00 m/s MV Vmean:      110.0 cm/s MV Decel Time: 110 msec MV E velocity: 120.00 cm/s MV A velocity: 197.00 cm/s MV E/A ratio:  0.61 Wilbert Bihari MD Electronically signed by Wilbert Bihari MD Signature Date/Time: 04/14/2024/4:43:05 PM    Final    CT ANGIO HEAD NECK W WO CM Addendum Date: 04/13/2024  ADDENDUM #1  ADDENDUM: Findings discussed with Dr. Bryn at 6:56PM on 04/13/24. ---------------------------------------------------- Electronically signed by: Donnice Mania MD  04/13/2024 07:00 PM EDT RP Workstation: [id number]   Result Date: 04/13/2024  ORIGINAL REPORT  EXAM: CT HEAD WITHOUT CTA HEAD AND NECK WITH AND WITHOUT 04/13/2024 05:51:02 PM TECHNIQUE: CTA of the head and neck was performed with and without the administration of intravenous contrast. Noncontrast CT of the head with reconstructed 2-D images are also provided for review. Multiplanar 2D and/or 3D reformatted images are provided for review. Automated exposure control, iterative reconstruction, and/or weight based adjustment of the mA/kV was utilized to reduce the radiation dose to as low as reasonably achievable. COMPARISON: MRI head 08/25 and CT head 04/12/24 CLINICAL HISTORY: Stroke, follow up. FINDINGS: CT HEAD: BRAIN AND VENTRICLES: No acute intracranial hemorrhage. Nonspecific hypoattenuation in the periventricular and subcortical white matter, most likely representing chronic microvascular ischemic changes. Similar appearance of possible recent infarct in the right parietal white matter. Postsurgical changes of the paranasal sinuses. ORBITS: Bilateral lens replacement. SINUSES AND MASTOIDS: Postsurgical changes of the paranasal sinuses. Mucosal thickening in the left maxillary sinus. CTA NECK: AORTIC ARCH AND ARCH VESSELS: Mild atherosclerosis of the visualized aortic arch. CERVICAL CAROTID ARTERIES: The right carotid artery is patent from  the origin to the skull base. Mild atherosclerosis at the right carotid bifurcation without hemodynamically significant stenosis. The left carotid artery is patent from the origin to the skull base. Mild atherosclerosis at the left carotid bifurcation without hemodynamically significant stenosis. CERVICAL VERTEBRAL ARTERIES: The vertebral arteries are patent from the origins to the vertebrobasilar confluence. Atherosclerosis of the left vertebral artery at C2 resulting in mild stenosis. LUNGS AND MEDIASTINUM: Opacities in the peripheral right upper lobe, which may reflect  scarring. SOFT TISSUES: No acute abnormality. BONES: Changes in the visualized spine with prominent endplate osteophytes at multiple levels. Edentulous maxilla and mandible. CTA HEAD: ANTERIOR CIRCULATION: The intracranial internal carotid arteries are patent bilaterally. There is mild atherosclerosis of the carotid siphons. Mild stenosis of the right cavernous ICA. The right MCA is patent. 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 There is reconstitution of the M2 branch on the left within the sylvian fissure, although the vessel appears diminutive in caliber and irregular. Overall diminished number and caliber of MCA branches within the posterior aspect of the left MCA territory compared to the right. The anterior cerebral arteries are patent bilaterally. POSTERIOR CIRCULATION: No significant stenosis of the posterior cerebral arteries. No significant stenosis of the basilar artery. No significant stenosis of the vertebral arteries. OTHER: No dural venous sinus thrombosis on this non-dedicated study. IMPRESSION: 1. 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 2. Reconstitution of the M2 branch on the left within the sylvian fissure, although the vessel appears diminutive in caliber and irregular. Overall diminished number and caliber of MCA branches within the posterior aspect of the left MCA territory compared to the right. 3. No acute intracranial hemorrhage. 4. Mild atherosclerosis of the visualized aortic arch, carotid bifurcations, and carotid siphons without hemodynamically significant stenosis. Mild stenosis of the right cavernous ICA. Electronically signed by: Donnice Mania MD 04/13/2024 06:17 PM EDT RP Workstation: [id number]   CT ABDOMEN PELVIS W CONTRAST Result Date: 04/12/2024 CLINICAL DATA:  Acute abdominal pain.  Dizziness and multiple falls. EXAM: CT ABDOMEN AND PELVIS WITH CONTRAST TECHNIQUE:  Multidetector CT imaging of the abdomen and pelvis was performed using the standard protocol following bolus administration of intravenous contrast. RADIATION DOSE REDUCTION: This exam was performed according to the departmental dose-optimization program which includes automated exposure control, adjustment of the mA and/or kV according to patient size and/or use of iterative reconstruction technique. CONTRAST:  80mL OMNIPAQUE  IOHEXOL  300 MG/ML  SOLN COMPARISON:  CT 09/25/2021 FINDINGS: Lower chest: Chronic cystic changes in the lung bases. No acute airspace disease. Hepatobiliary: Post cholecystectomy. Prominence of the central intrahepatic and extrahepatic common bile duct likely related to prior cholecystectomy. The distal CBD measures 9 mm, normal for postcholecystectomy state. No focal liver abnormality. Pancreas: Atrophic.  No ductal dilatation or inflammation. Spleen: Normal in size without focal abnormality. Adrenals/Urinary Tract: No adrenal nodule. No hydronephrosis. Simple appearing cyst in the mid right kidney. No further follow-up imaging is recommended. Lobulated right renal contours suggestive of scarring. Heterogeneous enhancement of the upper pole of the right kidney. No perinephric stranding. No renal calculi. Decompressed ureters. Partially distended urinary bladder, normal for degree of distension. Stomach/Bowel: Multiple sigmoid colonic diverticula with mild inflammation about the proximal sigmoid, suggestive of mild diverticulitis. No perforation or abscess. Descending and sigmoid colonic wall thickening versus nondistention. Appendectomy per history. There is no small bowel inflammation or obstruction. Stomach is  partially distended without wall thickening. Vascular/Lymphatic: Aorto bi-iliac atherosclerosis. No aortic aneurysm. The portal vein is patent. No enlarged lymph nodes in the abdomen or pelvis. Reproductive: Uterus and bilateral adnexa are unremarkable. Other: No free air. No  abdominopelvic collection. No free fluid. Small fat containing umbilical hernia. Musculoskeletal: The bones are subjectively under mineralized. Degenerative change in the spine and hips. There are no acute or suspicious osseous abnormalities. IMPRESSION: 1. Mild sigmoid colonic diverticulitis. No perforation or abscess. 2. Descending and sigmoid colonic wall thickening versus nondistention, can be seen with colitis. 3. Heterogeneous enhancement of the upper pole of the right kidney. This may be related to scarring or pyelonephritis recommend correlation with urinalysis. Aortic Atherosclerosis (ICD10-I70.0). Electronically Signed   By: Andrea Gasman M.D.   On: 04/12/2024 17:05   CT Head Wo Contrast Result Date: 04/12/2024 CLINICAL DATA:  Mental status change, unknown cause. Hallucinations. Weakness and dizziness with multiple recent falls. EXAM: CT HEAD WITHOUT CONTRAST TECHNIQUE: Contiguous axial images were obtained from the base of the skull through the vertex without intravenous contrast. RADIATION DOSE REDUCTION: This exam was performed according to the departmental dose-optimization program which includes automated exposure control, adjustment of the mA and/or kV according to patient size and/or use of iterative reconstruction technique. COMPARISON:  Head MRI 02/27/2024 and CT 09/25/2021 FINDINGS: Brain: There is an approximately 1.5 cm focus of hypodensity in the right parietal white matter near the posterior aspect of the right corona radiata which appears new or progressive from last month's MRI and is suspicious for a recent infarct. 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 No intracranial hemorrhage, mass, midline shift, hydrocephalus, or extra-axial fluid collection is identified. Vascular: Calcified atherosclerosis at the skull base. No hyperdense vessel. Skull: No fracture or  suspicious lesion. Sinuses/Orbits: Prior functional endoscopic sinus surgery. Clear mastoid air cells. Bilateral cataract extraction. Other: None. IMPRESSION: 1. Suspected recent infarct in the right parietal white matter. 2. No intracranial hemorrhage. 3. Chronic small vessel ischemia. Electronically Signed   By: Dasie Hamburg M.D.   On: 04/12/2024 16:48   DG Chest Portable 1 View Result Date: 04/12/2024 CLINICAL DATA:  Weakness. EXAM: PORTABLE CHEST 1 VIEW COMPARISON:  01/21/2023 FINDINGS: Both lungs are clear. Heart size is normal. Trachea is midline. Negative for a pneumothorax. Degenerative changes at both shoulders and AC joints. Bridging osteophytes along the right side of the thoracic spine. IMPRESSION: No active disease. Electronically Signed   By: Juliene Balder M.D.   On: 04/12/2024 14:22    Time coordinating discharge: 60 mins  SIGNED:  Camellia Door, DO [facility] 05/09/24, 3:24 PM

## 2024-05-09 NOTE — Progress Notes (Signed)
 PROGRESS NOTE    Betty Golden  FMW:[id number] DOB: November 12, 1945 DOA: 05/06/2024 PCP: Irven Ozell DEL, MD  Subjective: Pt seen and examined this AM. Afebrile. Legionella antigen negative. On RA. Strep pneumo antigen negative. Awaiting PT evaluation.  Bicarb low on BMP at 14.  Got VBG that showed bicarb of 19. I think there is a problem with lab chemistry analysis. Repeat BMP drawn at the same time as VBG showed bicarb of 13.   Hospital Course: CC: chest pain, palpitations,  HPI: Betty Golden is a 78 y.o. 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  Patient reported not feeling well for last 2 days.  Reported midepigastric pain that radiates to the chest with associated heartburn.  Patient also reported chest pain with cough and deep breathing as well.  Reporting exertional dyspnea.  Patient unable to ambulate very short distance due to significant dyspnea which is new to per patient's family. Patient denies any fever, chill, nausea, vomiting, UTI symptoms URI symptoms.  Family reported patient is not eating or drinking well.  Patient has been recently discharged from rehab from recent admission from CVA.   During my potation at bedside patient is stating that she developed chest pain with coughing and shortness of breath.   Patient reported midepigastric pain that get worsen with eating food which radiates to lower chest as well. Patient does not have any other complaint at this time.   ED Course:  At presentation to ED patient found tachycardic heart rate 122, tachypneic 31 and borderline hypertensive.  O2 sat 100% room air.  Afebrile.   EKG showing sinus tachycardia heart rate 124. Lab work, BMP shows low potassium 3.3, low bicarb 18, elevated creatinine 1.19, elevated anion gap 17 and GFR 47. CBC showing  leukocytosis 18.3 otherwise unremarkable. Flat troponin 38 and 36. Blood cultures are in process. Low mag 1.6. Normal TSH and T4. Elevated lactic acid 2 improved to 1.9. VBG unremarkable.   Pending respiratory panel.   CTA chest no evidence of pulmonary embolism.  Prominent emphysematous changes of the lung.  Multifocal pneumonia.  Aortic atherosclerosis.   Chest x-ray showing no acute disease process.   In the ED patient received aspirin  324 mg in the setting of chest pain, cefepime, ceftriaxone , Ativan , 2 L of LR bolus, vancomycin  and famotidine .     Hospitalist has been consulted for further evaluation management of sepsis in the setting of community-acquired pneumonia, pleuritic chest pain due to pneumonia, elevated troponin secondary to demand ischemia, hypokalemia, hypomagnesemia and acute kidney injury.  Significant Events: Admitted 05/06/2024 for pneumonia   Admission Labs: WBC 18.3, HgB 12.9, plt 213 Na 138, K 3.3, CO2 of 18, BUN 11, scr 1.19, glu 119 VBG pH 7.4, PCO2 of 38 Lactic acid 2.0 TSH 2.62, FT4 of 1.12 Mg 1.6 Procalcitonin < 0.10 UA hazy, spg 1.031, HgB moderate, LE large, WBC 11-20, rare bacteria Covid/rsv/flu negative  Admission Imaging Studies: CXR No acute process.  CTPA No evidence of significant pulmonary embolus. 2. Prominent emphysematous changes throughout the lungs. 3. Patchy ground-glass infiltrates in the lungs likely represent multifocal pneumonia. 4. Mild aortic atherosclerosis  Significant Labs: Strep Pneumo negative Resp Viral Panel Negative  Significant Imaging Studies:   Antibiotic Therapy: Anti-infectives (From admission, onward)    Start     Dose/Rate Route Frequency Ordered Stop   05/07/24 2000  vancomycin  (VANCOREADY) IVPB 750 mg/150  mL        750 mg 150 mL/hr over 60 Minutes Intravenous Every 24 hours 05/07/24 0024     05/07/24 1200  ceFEPIme (MAXIPIME) 2 g in sodium chloride  0.9 % 100 mL IVPB        2 g 200 mL/hr over 30  Minutes Intravenous Every 12 hours 05/07/24 0024     05/06/24 2315  vancomycin  (VANCOCIN ) IVPB 1000 mg/200 mL premix        1,000 mg 200 mL/hr over 60 Minutes Intravenous  Once 05/06/24 2304 05/07/24 0328   05/06/24 2315  ceFEPIme (MAXIPIME) 2 g in sodium chloride  0.9 % 100 mL IVPB        2 g 200 mL/hr over 30 Minutes Intravenous  Once 05/06/24 2304 05/07/24 0123   05/06/24 1900  cefTRIAXone  (ROCEPHIN ) 1 g in sodium chloride  0.9 % 100 mL IVPB        1 g 200 mL/hr over 30 Minutes Intravenous  Once 05/06/24 1856 05/06/24 2143       Procedures:   Consultants:     Assessment and Plan: * Sepsis due to pneumonia (HCC) 05/08/24 present on admission. Meets criteria on admission. WBC 18.3, HR 123, temp 102.3. stop cefepime/vanco. Change to rocephin /zithromax . Can go home on duricef/zithromax . Start probiotics. On RA. Afebrile for 24 hours. Strep pneumo antigen negative. Resp Viral panel negative. WBC down to 16.9. repeat CBC in AM. Procal < 0.10  05/09/24 awaiting PT evaluation. Can do home with duricef and zithromax . On RA. Continue probiotic at home.   Pleuritic chest pain-resolved as of 05/09/2024 05/08/24 due to pneumonia.  05/09/24 resolved.  Elevated troponin-resolved as of 05/09/2024 05/08/24 troponin flat. Likely demand ischemia from sepsis. NOT NSTEMI or ACS.  05/09/24 resolved.  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 Stable.  05/09/24 chronic. Stable. Due to prior CVA.  Rheumatoid arthritis (HCC) 05/08/24 stable.  05/09/24 stable. F/u with PCP  COPD (chronic obstructive pulmonary disease) (HCC) 05/08/24 stable. On inhalers and prn nebs. Not exacerbated. On RA. No wheezing.  05/09/24 stable. On RA. Continue inhalers at home. Not exacerbated. Does not need steroids.  Essential hypertension 05/08/24 BP stable. On Toprol -XL 25 mg daily.  05/09/24    Hypomagnesemia-resolved as of 05/08/2024 05/08/24 admission Mg of 1.6, repleted.  Resolved.  Hypokalemia-resolved as of 05/09/2024 05/08/24 replete with po kcl. Repeat BMP in AM.  05/09/24 resolved. K 4.1 today.      DVT prophylaxis: heparin  injection 5,000 Units Start: 05/07/24 0600 SCDs Start: 05/06/24 2356 Place TED hose Start: 05/06/24 2356    Code Status: Full Code Family Communication: no family at bedside. Spoke with dtr nicole yesterday Disposition Plan: home Reason for continuing need for hospitalization: stable for DC after PT evaluation.  Objective: Vitals:   05/09/24 0423 05/09/24 0830 05/09/24 0836 05/09/24 1041  BP: (!) 166/94 (!) 180/93 (!) 180/93 (!) 158/84  Pulse: (!) 106 (!) 108 (!) 108 100  Resp: 20  19   Temp: 98.1 F (36.7 C)  98.2 F (36.8 C)   TempSrc:   Oral   SpO2: 100%  100%   Weight:      Height:        Intake/Output Summary (Last 24 hours) at 05/09/2024 1321 Last data filed at 05/09/2024 0519 Gross per 24 hour  Intake 1002.1 ml  Output --  Net 1002.1 ml   Filed Weights   05/06/24 1721 05/07/24 1839  Weight: 69 kg 69 kg    Examination:  Physical Exam  Vitals and nursing note reviewed.  Constitutional:      General: She is not in acute distress.    Appearance: She is not toxic-appearing or diaphoretic.  HENT:     Head: Normocephalic and atraumatic.  Cardiovascular:     Rate and Rhythm: Normal rate and regular rhythm.  Pulmonary:     Effort: Pulmonary effort is normal. No respiratory distress.     Breath sounds: Normal breath sounds. No wheezing or rales.  Abdominal:     General: Abdomen is flat. Bowel sounds are normal. There is no distension.     Palpations: Abdomen is soft.  Musculoskeletal:     Right lower leg: No edema.     Left lower leg: No edema.  Skin:    General: Skin is warm and dry.     Capillary Refill: Capillary refill takes less than 2 seconds.  Neurological:     Mental Status: She is alert.     Comments: +expressive aphasia     Data Reviewed: I have personally reviewed following  labs and imaging studies  CBC: Recent Labs  Lab 05/06/24 1741 05/06/24 2023 05/07/24 0237 05/08/24 0749 05/09/24 0531  WBC 18.3*  --  14.2* 16.9* 16.8*  NEUTROABS  --   --   --  12.7* 11.8*  HGB 12.9 12.6 11.8* 11.0* 11.6*  HCT 38.3 37.0 35.8* 34.4* 35.8*  MCV 84.4  --  86.3 87.5 88.8  PLT 213  --  182 166 175   Basic Metabolic Panel: Recent Labs  Lab 05/06/24 1741 05/06/24 2010 05/06/24 2023 05/07/24 0237 05/08/24 0749 05/09/24 0531 05/09/24 1203  NA 138  --  142 140 139 140 139  K 3.3*  --  2.8* 3.3* 3.1* 4.0 4.1  CL 103  --   --  110 110 111 111  CO2 18*  --   --  19* 18* 14* 13*  GLUCOSE 119*  --   --  113* 83 74 86  BUN 11  --   --  11 9 9 9   CREATININE 1.19*  --   --  1.05* 1.16* 1.11* 1.02*  CALCIUM  9.6  --   --  8.4* 8.9 9.4 9.4  MG  --  1.6*  --   --  1.7  --   --    GFR: Estimated Creatinine Clearance: 43.7 mL/min (A) (by C-G formula based on SCr of 1.02 mg/dL (H)). Liver Function Tests: Recent Labs  Lab 05/07/24 0237 05/09/24 0531  AST 24 26  ALT 19 19  ALKPHOS 42 54  BILITOT 0.6 0.9  PROT 6.1* 6.5  ALBUMIN 3.3* 3.0*   Coagulation Profile: Recent Labs  Lab 05/07/24 0020  INR 0.9   CBG: Recent Labs  Lab 05/08/24 0533  GLUCAP 75   Thyroid Function Tests: Recent Labs    05/06/24 2010  TSH 2.627  FREET4 1.12   Sepsis Labs: Recent Labs  Lab 05/06/24 2025 05/06/24 2231 05/07/24 0020  PROCALCITON  --   --  <0.10  LATICACIDVEN 2.0* 1.9  --     Recent Results (from the past 240 hours)  Blood culture (routine x 2)     Status: None (Preliminary result)   Collection Time: 05/06/24  8:10 PM   Specimen: BLOOD RIGHT ARM  Result Value Ref Range Status   Specimen Description BLOOD RIGHT ARM  Final   Special Requests   Final    BOTTLES DRAWN AEROBIC AND ANAEROBIC Blood Culture adequate volume   Culture  Final    NO GROWTH 3 DAYS Performed at [facility], [address], [place], [place] [address]    Report Status PENDING   Incomplete  Blood culture (routine x 2)     Status: None (Preliminary result)   Collection Time: 05/06/24 10:13 PM   Specimen: BLOOD  Result Value Ref Range Status   Specimen Description BLOOD SITE NOT SPECIFIED  Final   Special Requests   Final    BOTTLES DRAWN AEROBIC ONLY Blood Culture results may not be optimal due to an inadequate volume of blood received in culture bottles   Culture   Final    NO GROWTH 3 DAYS Performed at [facility], [address], [place], [place] [address]    Report Status PENDING  Incomplete  Resp panel by RT-PCR (RSV, Flu A&B, Covid) Urine, Clean Catch     Status: None   Collection Time: 05/07/24 12:20 AM   Specimen: Urine, Clean Catch; Nasal Swab  Result Value Ref Range Status   SARS Coronavirus 2 by RT PCR NEGATIVE NEGATIVE Final   Influenza A by PCR NEGATIVE NEGATIVE Final   Influenza B by PCR NEGATIVE NEGATIVE Final    Comment: (NOTE) The Xpert Xpress SARS-CoV-2/FLU/RSV plus assay is intended as an aid in the diagnosis of influenza from Nasopharyngeal swab specimens and should not be used as a sole basis for treatment. Nasal washings and aspirates are unacceptable for Xpert Xpress SARS-CoV-2/FLU/RSV testing.  Fact Sheet for Patients: BloggerCourse.com  Fact Sheet for Healthcare Providers: SeriousBroker.it  This test is not yet approved or cleared by the [place]  FDA and has been authorized for detection and/or diagnosis of SARS-CoV-2 by FDA under an Emergency Use Authorization (EUA). This EUA will remain in effect (meaning this test can be used) for the duration of the COVID-19 declaration under Section 564(b)(1) of the Act, 21 U.S.C. section 360bbb-3(b)(1), unless the authorization is terminated or revoked.     Resp Syncytial Virus by PCR NEGATIVE NEGATIVE Final    Comment: (NOTE) Fact Sheet for Patients: BloggerCourse.com  Fact Sheet for Healthcare  Providers: SeriousBroker.it  This test is not yet approved or cleared by the [place]  FDA and has been authorized for detection and/or diagnosis of SARS-CoV-2 by FDA under an Emergency Use Authorization (EUA). This EUA will remain in effect (meaning this test can be used) for the duration of the COVID-19 declaration under Section 564(b)(1) of the Act, 21 U.S.C. section 360bbb-3(b)(1), unless the authorization is terminated or revoked.  Performed at [facility], [address], [place], [place] [address]   Respiratory (~20 pathogens) panel by PCR     Status: None   Collection Time: 05/07/24  7:42 AM   Specimen: Nasopharyngeal Swab; Respiratory  Result Value Ref Range Status   Adenovirus NOT DETECTED NOT DETECTED Final   Coronavirus 229E NOT DETECTED NOT DETECTED Final    Comment: (NOTE) The Coronavirus on the Respiratory Panel, DOES NOT test for the novel  Coronavirus (2019 nCoV)    Coronavirus HKU1 NOT DETECTED NOT DETECTED Final   Coronavirus NL63 NOT DETECTED NOT DETECTED Final   Coronavirus OC43 NOT DETECTED NOT DETECTED Final   Metapneumovirus NOT DETECTED NOT DETECTED Final   Rhinovirus / Enterovirus NOT DETECTED NOT DETECTED Final   Influenza A NOT DETECTED NOT DETECTED Final   Influenza B NOT DETECTED NOT DETECTED Final   Parainfluenza Virus 1 NOT DETECTED NOT DETECTED Final   Parainfluenza Virus 2 NOT DETECTED NOT DETECTED  Final   Parainfluenza Virus 3 NOT DETECTED NOT DETECTED Final   Parainfluenza Virus 4 NOT DETECTED NOT DETECTED Final   Respiratory Syncytial Virus NOT DETECTED NOT DETECTED Final   Bordetella pertussis NOT DETECTED NOT DETECTED Final   Bordetella Parapertussis NOT DETECTED NOT DETECTED Final   Chlamydophila pneumoniae NOT DETECTED NOT DETECTED Final   Mycoplasma pneumoniae NOT DETECTED NOT DETECTED Final    Comment: Performed at [facility], [address] [place], [place] [address]  MRSA Next Gen by  PCR, Nasal     Status: None   Collection Time: 05/07/24  9:36 AM   Specimen: Nasopharyngeal Swab; Nasal Swab  Result Value Ref Range Status   MRSA by PCR Next Gen NOT DETECTED NOT DETECTED Final    Comment: (NOTE) The GeneXpert MRSA Assay (FDA approved for NASAL specimens only), is one component of a comprehensive MRSA colonization surveillance program. It is not intended to diagnose MRSA infection nor to guide or monitor treatment for MRSA infections. Test performance is not FDA approved in patients less than 28 years old. Performed at [facility], [address] [place], [place] [address]     Scheduled Meds:  aspirin  EC  81 mg Oral Daily   azithromycin   500 mg Oral Daily   budesonide -glycopyrrolate -formoterol   2 puff Inhalation BID   clopidogrel   75 mg Oral Daily   cyanocobalamin   1,000 mcg Oral Daily   DULoxetine   30 mg Oral BID   ferrous sulfate  325 mg Oral Daily   heparin   5,000 Units Subcutaneous Q8H   melatonin  5 mg Oral QHS   [START ON 05/10/2024] metoprolol  succinate  50 mg Oral Daily   pantoprazole   40 mg Oral Daily   rosuvastatin   20 mg Oral Daily   saccharomyces boulardii  250 mg Oral BID   sodium chloride  flush  3 mL Intravenous Q12H   sodium chloride  flush  3 mL Intravenous Q12H   traZODone   25 mg Oral QHS   Continuous Infusions:  cefTRIAXone  (ROCEPHIN )  IV Stopped (05/08/24 2243)     LOS: 3 days   Time spent: 55 minutes  Camellia Door, DO  [facility]  05/09/2024, 1:21 PM

## 2024-05-09 NOTE — Evaluation (Signed)
 Physical Therapy Evaluation Patient Details Name: Betty Golden MRN: [id number] DOB: 1945/12/06 Today's Date: 05/09/2024  History of Present Illness  Betty Golden is a 78 y.o. female admitted 05/06/24 for sepsis d/t PNA. PMHx: CVA (03/2024), chronic CHF, RA, fibromyalgia, essential hypertension, and COPD.  Clinical Impression  Pt admitted with above diagnosis. Examination limited by impaired cognition and communication secondary to CVA, pt being a poor historian, and lack of family present. Obtained PLOF and home set-up via chart review. PTA, pt was ambulating using RW with supervision. She was modI for transfers using RW and required assist with ADLs/IADLs. Pt lives with her daughter in a two story house where she is able to reside on the main level. Pt currently with functional limitations due to the deficits listed below (see PT Problem List). She performed bed mobility and transfers with supervision and required CGA for gait using RW. Pt ambulated a household distance. She is currently limited by decreased activity tolerance. Pt will benefit from acute skilled PT to increase her independence and safety with mobility to allow discharge. Recommend HHPT to increase strength, improve balance, decrease fall risk, advance activity tolerance, and optimize safety within the home environment.      If plan is discharge home, recommend the following: A little help with walking and/or transfers;A little help with bathing/dressing/bathroom;Assistance with cooking/housework;Assist for transportation;Help with stairs or ramp for entrance;Supervision due to cognitive status;Direct supervision/assist for medications management   Can travel by private vehicle        Equipment Recommendations None recommended by PT  Recommendations for Other Services       Functional Status Assessment Patient has had a recent decline in their functional status and demonstrates the ability to make significant  improvements in function in a reasonable and predictable amount of time.     Precautions / Restrictions Precautions Precautions: Fall Recall of Precautions/Restrictions: Intact Restrictions Weight Bearing Restrictions Per Provider Order: No      Mobility  Bed Mobility Overal bed mobility: Needs Assistance Bed Mobility: Supine to Sit, Sit to Supine     Supine to sit: Supervision, HOB elevated Sit to supine: Supervision, HOB elevated   General bed mobility comments: HOB elevated. No use of bedrail. Slightly increased time. Sup for safety.    Transfers Overall transfer level: Needs assistance Equipment used: Rolling walker (2 wheels) Transfers: Sit to/from Stand Sit to Stand: Contact guard assist           General transfer comment: Pt stood from lowest bed height. She demonstrated proper hand placement using RW. Powered up with light assist. Good eccentric control.    Ambulation/Gait Ambulation/Gait assistance: Contact guard assist Gait Distance (Feet): 100 Feet Assistive device: Rolling walker (2 wheels) Gait Pattern/deviations: Step-through pattern, Decreased stride length Gait velocity: decreased Gait velocity interpretation: <1.31 ft/sec, indicative of household ambulator   General Gait Details: Pt ambulated with a step-through gait pattern, even weight shift, and good foot clearence. Cues for proximity to RW. Pt had difficulty communicating and would intermittently use R hand to gesture or draw out letters. Instructed pt to maintain BUE support on RW at all times. She navigated room/hallway well, without LOB. Distance limited d/t SOB and fatigue.  Stairs            Wheelchair Mobility     Tilt Bed    Modified Rankin (Stroke Patients Only)       Balance Overall balance assessment: Needs assistance Sitting-balance support: Feet supported, No upper extremity supported Sitting balance-Leahy  Scale: Good     Standing balance support: Bilateral upper  extremity supported, During functional activity, Reliant on assistive device for balance Standing balance-Leahy Scale: Poor Standing balance comment: Pt dependent on RW                             Pertinent Vitals/Pain Pain Assessment Pain Assessment: Faces Faces Pain Scale: Hurts little more Pain Location: Generalized with movement Pain Descriptors / Indicators: Grimacing, Aching, Discomfort Pain Intervention(s): Monitored during session, Limited activity within patient's tolerance, Repositioned    Home Living Family/patient expects to be discharged to:: Private residence Living Arrangements: Children (Daughter) Available Help at Discharge: Family;Available 24 hours/day Type of Home: House Home Access: Stairs to enter Entrance Stairs-Rails: Doctor, general practice of Steps: 1 stair to the garage with 1/2 rise to entrance is her best option oppose to front entrance   Home Layout: Two level;Able to live on main level with bedroom/bathroom;Full bath on main level Home Equipment: Rolling Walker (2 wheels);Wheelchair - manual;Cane - single point;BSC/3in1 Additional Comments: Above information obtained per chart review    Prior Function Prior Level of Function : Patient poor historian/Family not available             Mobility Comments: Per chart review, pt was modI for transfers using RW and required supervision for gait using RW.  She continues to be at high risk for falls with Lars Balance score at 21/56. ADLs Comments: Per chart review, pt required assist for basic self-care skills     Extremity/Trunk Assessment   Upper Extremity Assessment Upper Extremity Assessment: Defer to OT evaluation    Lower Extremity Assessment Lower Extremity Assessment: Difficult to assess due to impaired cognition;Generalized weakness    Cervical / Trunk Assessment Cervical / Trunk Assessment: Normal  Communication   Communication Communication: Impaired Factors  Affecting Communication: Difficulty expressing self;Reduced clarity of speech    Cognition Arousal: Alert Behavior During Therapy: WFL for tasks assessed/performed   PT - Cognitive impairments: History of cognitive impairments, Difficult to assess Difficult to assess due to: Impaired communication                     PT - Cognition Comments: 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 Following commands: Impaired Following commands impaired: Follows one step commands with increased time     Cueing Cueing Techniques: Verbal cues, Gestural cues, Tactile cues     General Comments General comments (skin integrity, edema, etc.): Pt found to be soiled, NT entered to address pericare. PT left and came back to finish evaluation. VSS on RA. Pt appeared to experience SOB. Cued PLB technique.    Exercises     Assessment/Plan    PT Assessment Patient needs continued PT services  PT Problem List Cardiopulmonary status limiting activity;Decreased activity tolerance;Decreased balance;Decreased mobility       PT Treatment Interventions DME instruction;Gait training;Stair training;Functional mobility training;Therapeutic activities;Therapeutic exercise;Balance training;Patient/family education    PT Goals (Current goals can be found in the Care Plan section)  Acute Rehab PT Goals Patient Stated Goal: Return Home PT Goal Formulation: With patient Time For Goal Achievement: 05/23/24 Potential to Achieve Goals: Good    Frequency Min 2X/week     Co-evaluation               AM-PAC PT 6 Clicks Mobility  Outcome Measure Help needed turning from your back to your side while in a flat bed  without using bedrails?: A Little Help needed moving from lying on your back to sitting on the side of a flat bed without using bedrails?: A Little Help needed moving to and from a bed to a chair (including a wheelchair)?: A Little Help needed standing up from a chair using your  arms (e.g., wheelchair or bedside chair)?: A Little Help needed to walk in hospital room?: A Little Help needed climbing 3-5 steps with a railing? : A Lot 6 Click Score: 17    End of Session Equipment Utilized During Treatment: Gait belt Activity Tolerance: Patient tolerated treatment well Patient left: in bed;with call bell/phone within reach;with bed alarm set Nurse Communication: Mobility status PT Visit Diagnosis: Difficulty in walking, not elsewhere classified (R26.2);Unsteadiness on feet (R26.81)    Time: 8662-8652; 8549-8497 PT Time Calculation (min) (ACUTE ONLY): 22 min   Charges:   PT Evaluation $PT Eval Moderate Complexity: 1 Mod   PT General Charges $$ ACUTE PT VISIT: 1 Visit         Randall SAUNDERS, PT, DPT Acute Rehabilitation Services Office: [phone] Secure Chat Preferred  Delon CHRISTELLA Callander 05/09/2024, 3:19 PM

## 2024-05-09 NOTE — Progress Notes (Signed)
 Telemetry monitor removed, per MD order to discontinue.

## 2024-05-10 ENCOUNTER — Encounter: Admitting: Registered Nurse

## 2024-05-11 LAB — CULTURE, BLOOD (ROUTINE X 2)
Culture: NO GROWTH
Culture: NO GROWTH
Special Requests: ADEQUATE

## 2024-05-12 ENCOUNTER — Encounter (INDEPENDENT_AMBULATORY_CARE_PROVIDER_SITE_OTHER): Payer: Self-pay | Admitting: Gastroenterology

## 2024-05-22 ENCOUNTER — Inpatient Hospital Stay (HOSPITAL_COMMUNITY)

## 2024-05-22 ENCOUNTER — Emergency Department (HOSPITAL_COMMUNITY)

## 2024-05-22 ENCOUNTER — Inpatient Hospital Stay (HOSPITAL_COMMUNITY)
Admission: EM | Admit: 2024-05-22 | Discharge: 2024-05-26 | DRG: 853 | Disposition: A | Discharge status: Home Health | Attending: Internal Medicine | Admitting: Internal Medicine

## 2024-05-22 ENCOUNTER — Encounter (HOSPITAL_COMMUNITY): Payer: Self-pay | Admitting: Pharmacy Technician

## 2024-05-22 ENCOUNTER — Other Ambulatory Visit: Payer: Self-pay

## 2024-05-22 DIAGNOSIS — I69320 Aphasia following cerebral infarction: Secondary | ICD-10-CM | POA: Diagnosis not present

## 2024-05-22 DIAGNOSIS — E785 Hyperlipidemia, unspecified: Secondary | ICD-10-CM | POA: Diagnosis present

## 2024-05-22 DIAGNOSIS — E872 Acidosis, unspecified: Secondary | ICD-10-CM | POA: Diagnosis present

## 2024-05-22 DIAGNOSIS — Z823 Family history of stroke: Secondary | ICD-10-CM | POA: Diagnosis not present

## 2024-05-22 DIAGNOSIS — R6520 Severe sepsis without septic shock: Secondary | ICD-10-CM | POA: Diagnosis present

## 2024-05-22 DIAGNOSIS — I69341 Monoplegia of lower limb following cerebral infarction affecting right dominant side: Secondary | ICD-10-CM | POA: Diagnosis not present

## 2024-05-22 DIAGNOSIS — I10 Essential (primary) hypertension: Secondary | ICD-10-CM | POA: Diagnosis not present

## 2024-05-22 DIAGNOSIS — F32A Depression, unspecified: Secondary | ICD-10-CM | POA: Diagnosis present

## 2024-05-22 DIAGNOSIS — J449 Chronic obstructive pulmonary disease, unspecified: Secondary | ICD-10-CM | POA: Diagnosis present

## 2024-05-22 DIAGNOSIS — M797 Fibromyalgia: Secondary | ICD-10-CM | POA: Diagnosis present

## 2024-05-22 DIAGNOSIS — Z7902 Long term (current) use of antithrombotics/antiplatelets: Secondary | ICD-10-CM | POA: Diagnosis not present

## 2024-05-22 DIAGNOSIS — A419 Sepsis, unspecified organism: Secondary | ICD-10-CM

## 2024-05-22 DIAGNOSIS — Z7951 Long term (current) use of inhaled steroids: Secondary | ICD-10-CM

## 2024-05-22 DIAGNOSIS — J439 Emphysema, unspecified: Secondary | ICD-10-CM | POA: Diagnosis present

## 2024-05-22 DIAGNOSIS — I509 Heart failure, unspecified: Secondary | ICD-10-CM | POA: Diagnosis present

## 2024-05-22 DIAGNOSIS — I639 Cerebral infarction, unspecified: Secondary | ICD-10-CM | POA: Diagnosis not present

## 2024-05-22 DIAGNOSIS — Z79899 Other long term (current) drug therapy: Secondary | ICD-10-CM

## 2024-05-22 DIAGNOSIS — I2510 Atherosclerotic heart disease of native coronary artery without angina pectoris: Secondary | ICD-10-CM | POA: Diagnosis present

## 2024-05-22 DIAGNOSIS — F8081 Childhood onset fluency disorder: Secondary | ICD-10-CM | POA: Diagnosis present

## 2024-05-22 DIAGNOSIS — Z833 Family history of diabetes mellitus: Secondary | ICD-10-CM

## 2024-05-22 DIAGNOSIS — K5730 Diverticulosis of large intestine without perforation or abscess without bleeding: Secondary | ICD-10-CM | POA: Diagnosis present

## 2024-05-22 DIAGNOSIS — N179 Acute kidney failure, unspecified: Secondary | ICD-10-CM | POA: Diagnosis present

## 2024-05-22 DIAGNOSIS — Z825 Family history of asthma and other chronic lower respiratory diseases: Secondary | ICD-10-CM

## 2024-05-22 DIAGNOSIS — R1084 Generalized abdominal pain: Principal | ICD-10-CM

## 2024-05-22 DIAGNOSIS — Z8249 Family history of ischemic heart disease and other diseases of the circulatory system: Secondary | ICD-10-CM

## 2024-05-22 DIAGNOSIS — M069 Rheumatoid arthritis, unspecified: Secondary | ICD-10-CM | POA: Diagnosis present

## 2024-05-22 DIAGNOSIS — K219 Gastro-esophageal reflux disease without esophagitis: Secondary | ICD-10-CM | POA: Diagnosis present

## 2024-05-22 DIAGNOSIS — I6319 Cerebral infarction due to embolism of other precerebral artery: Secondary | ICD-10-CM | POA: Diagnosis present

## 2024-05-22 DIAGNOSIS — I110 Hypertensive heart disease with heart failure: Secondary | ICD-10-CM | POA: Diagnosis present

## 2024-05-22 DIAGNOSIS — Q239 Congenital malformation of aortic and mitral valves, unspecified: Secondary | ICD-10-CM

## 2024-05-22 DIAGNOSIS — E860 Dehydration: Secondary | ICD-10-CM | POA: Diagnosis present

## 2024-05-22 DIAGNOSIS — Z9049 Acquired absence of other specified parts of digestive tract: Secondary | ICD-10-CM

## 2024-05-22 DIAGNOSIS — K529 Noninfective gastroenteritis and colitis, unspecified: Secondary | ICD-10-CM | POA: Diagnosis present

## 2024-05-22 DIAGNOSIS — Z87891 Personal history of nicotine dependence: Secondary | ICD-10-CM

## 2024-05-22 DIAGNOSIS — I6340 Cerebral infarction due to embolism of unspecified cerebral artery: Secondary | ICD-10-CM | POA: Diagnosis not present

## 2024-05-22 DIAGNOSIS — I6310 Cerebral infarction due to embolism of unspecified precerebral artery: Secondary | ICD-10-CM | POA: Diagnosis not present

## 2024-05-22 DIAGNOSIS — I340 Nonrheumatic mitral (valve) insufficiency: Secondary | ICD-10-CM | POA: Diagnosis not present

## 2024-05-22 DIAGNOSIS — E876 Hypokalemia: Secondary | ICD-10-CM | POA: Diagnosis present

## 2024-05-22 DIAGNOSIS — Z7982 Long term (current) use of aspirin: Secondary | ICD-10-CM

## 2024-05-22 DIAGNOSIS — R112 Nausea with vomiting, unspecified: Secondary | ICD-10-CM

## 2024-05-22 DIAGNOSIS — Z882 Allergy status to sulfonamides status: Secondary | ICD-10-CM | POA: Diagnosis not present

## 2024-05-22 DIAGNOSIS — I160 Hypertensive urgency: Secondary | ICD-10-CM | POA: Diagnosis present

## 2024-05-22 DIAGNOSIS — T1490XA Injury, unspecified, initial encounter: Secondary | ICD-10-CM | POA: Diagnosis not present

## 2024-05-22 HISTORY — DX: Sepsis, unspecified organism: A41.9

## 2024-05-22 LAB — COMPREHENSIVE METABOLIC PANEL WITH GFR
ALT: 22 U/L (ref 0–44)
AST: 32 U/L (ref 15–41)
Albumin: 3.9 g/dL (ref 3.5–5.0)
Alkaline Phosphatase: 66 U/L (ref 38–126)
Anion gap: 16 — ABNORMAL HIGH (ref 5–15)
BUN: 14 mg/dL (ref 8–23)
CO2: 15 mmol/L — ABNORMAL LOW (ref 22–32)
Calcium: 10.3 mg/dL (ref 8.9–10.3)
Chloride: 109 mmol/L (ref 98–111)
Creatinine, Ser: 1.57 mg/dL — ABNORMAL HIGH (ref 0.44–1.00)
GFR, Estimated: 34 mL/min — ABNORMAL LOW (ref >60)
Glucose, Bld: 167 mg/dL — ABNORMAL HIGH (ref 70–99)
Potassium: 4.9 mmol/L (ref 3.5–5.1)
Sodium: 140 mmol/L (ref 135–145)
Total Bilirubin: 1.1 mg/dL (ref 0.0–1.2)
Total Protein: 7.6 g/dL (ref 6.5–8.1)

## 2024-05-22 LAB — CBC WITH DIFFERENTIAL/PLATELET
Abs Immature Granulocytes: 0.1 K/uL — ABNORMAL HIGH (ref 0.00–0.07)
Basophils Absolute: 0.1 K/uL (ref 0.0–0.1)
Basophils Relative: 0 %
Eosinophils Absolute: 0 K/uL (ref 0.0–0.5)
Eosinophils Relative: 0 %
HCT: 43.1 % (ref 36.0–46.0)
Hemoglobin: 13.9 g/dL (ref 12.0–15.0)
Immature Granulocytes: 1 %
Lymphocytes Relative: 25 %
Lymphs Abs: 4.7 K/uL — ABNORMAL HIGH (ref 0.7–4.0)
MCH: 27.7 pg (ref 26.0–34.0)
MCHC: 32.3 g/dL (ref 30.0–36.0)
MCV: 85.9 fL (ref 80.0–100.0)
Monocytes Absolute: 1 K/uL (ref 0.1–1.0)
Monocytes Relative: 6 %
Neutro Abs: 12.8 K/uL — ABNORMAL HIGH (ref 1.7–7.7)
Neutrophils Relative %: 68 %
Platelets: 228 K/uL (ref 150–400)
RBC: 5.02 MIL/uL (ref 3.87–5.11)
RDW: 14.1 % (ref 11.5–15.5)
WBC: 18.7 K/uL — ABNORMAL HIGH (ref 4.0–10.5)
nRBC: 0 % (ref 0.0–0.2)

## 2024-05-22 LAB — PROTIME-INR
INR: 1 (ref 0.8–1.2)
Prothrombin Time: 13.3 s (ref 11.4–15.2)

## 2024-05-22 LAB — I-STAT CG4 LACTIC ACID, ED
Lactic Acid, Venous: 4.8 mmol/L (ref 0.5–1.9)
Lactic Acid, Venous: 2.8 mmol/L (ref 0.5–1.9)

## 2024-05-22 MED ORDER — IOHEXOL 350 MG/ML SOLN
75.0000 mL | Freq: Once | INTRAVENOUS | Status: AC | PRN
  Administered 2024-05-22: 75 mL via INTRAVENOUS

## 2024-05-22 MED ORDER — ACETAMINOPHEN 325 MG PO TABS
650.0000 mg | ORAL_TABLET | Freq: Four times a day (QID) | ORAL | Status: DC | PRN
  Administered 2024-05-23: 650 mg via ORAL
  Filled 2024-05-22: qty 2

## 2024-05-22 MED ORDER — LACTATED RINGERS IV BOLUS (SEPSIS)
1000.0000 mL | Freq: Once | INTRAVENOUS | Status: AC
  Administered 2024-05-22: 1000 mL via INTRAVENOUS

## 2024-05-22 MED ORDER — ONDANSETRON HCL 4 MG/2ML IJ SOLN
4.0000 mg | Freq: Once | INTRAMUSCULAR | Status: AC
  Administered 2024-05-22: 4 mg via INTRAVENOUS
  Filled 2024-05-22: qty 2

## 2024-05-22 MED ORDER — METOPROLOL SUCCINATE ER 50 MG PO TB24
50.0000 mg | ORAL_TABLET | Freq: Every day | ORAL | Status: DC
  Administered 2024-05-23 – 2024-05-26 (×4): 50 mg via ORAL
  Filled 2024-05-22 (×3): qty 1
  Filled 2024-05-22: qty 2

## 2024-05-22 MED ORDER — METRONIDAZOLE 500 MG/100ML IV SOLN
500.0000 mg | Freq: Once | INTRAVENOUS | Status: AC
  Administered 2024-05-22: 500 mg via INTRAVENOUS
  Filled 2024-05-22: qty 100

## 2024-05-22 MED ORDER — SODIUM CHLORIDE 0.9 % IV SOLN
2.0000 g | INTRAVENOUS | Status: DC
  Administered 2024-05-23: 2 g via INTRAVENOUS
  Filled 2024-05-22: qty 12.5

## 2024-05-22 MED ORDER — LACTATED RINGERS IV SOLN: INTRAVENOUS | Status: AC

## 2024-05-22 MED ORDER — SODIUM CHLORIDE 0.9 % IV BOLUS
500.0000 mL | Freq: Once | INTRAVENOUS | Status: AC
  Administered 2024-05-22: 500 mL via INTRAVENOUS

## 2024-05-22 MED ORDER — AMLODIPINE BESYLATE 10 MG PO TABS
10.0000 mg | ORAL_TABLET | Freq: Every day | ORAL | Status: DC
  Administered 2024-05-23 – 2024-05-26 (×4): 10 mg via ORAL
  Filled 2024-05-22: qty 1
  Filled 2024-05-22: qty 2
  Filled 2024-05-22 (×2): qty 1

## 2024-05-22 MED ORDER — VANCOMYCIN HCL 1250 MG/250ML IV SOLN
1250.0000 mg | INTRAVENOUS | Status: DC
  Filled 2024-05-22: qty 250

## 2024-05-22 MED ORDER — LACTATED RINGERS IV BOLUS (SEPSIS): 250.0000 mL | Freq: Once | INTRAVENOUS | Status: DC

## 2024-05-22 MED ORDER — LEFLUNOMIDE 10 MG PO TABS
20.0000 mg | ORAL_TABLET | Freq: Every day | ORAL | Status: DC
  Administered 2024-05-23 – 2024-05-26 (×3): 20 mg via ORAL
  Filled 2024-05-22 (×4): qty 2

## 2024-05-22 MED ORDER — ENOXAPARIN SODIUM 30 MG/0.3ML IJ SOSY
30.0000 mg | PREFILLED_SYRINGE | INTRAMUSCULAR | Status: DC
  Administered 2024-05-23: 30 mg via SUBCUTANEOUS
  Filled 2024-05-22: qty 0.3

## 2024-05-22 MED ORDER — SODIUM CHLORIDE 0.9 % IV SOLN: INTRAVENOUS | Status: DC

## 2024-05-22 MED ORDER — ACETAMINOPHEN 650 MG RE SUPP: 650.0000 mg | Freq: Four times a day (QID) | RECTAL | Status: DC | PRN

## 2024-05-22 MED ORDER — TRAZODONE HCL 50 MG PO TABS
25.0000 mg | ORAL_TABLET | Freq: Every day | ORAL | Status: DC
  Administered 2024-05-22 – 2024-05-25 (×4): 25 mg via ORAL
  Filled 2024-05-22 (×4): qty 1

## 2024-05-22 MED ORDER — OXYCODONE HCL 5 MG PO TABS: 5.0000 mg | ORAL_TABLET | ORAL | Status: DC | PRN

## 2024-05-22 MED ORDER — PANTOPRAZOLE SODIUM 40 MG PO TBEC
40.0000 mg | DELAYED_RELEASE_TABLET | Freq: Every day | ORAL | Status: DC
  Administered 2024-05-23 – 2024-05-26 (×4): 40 mg via ORAL
  Filled 2024-05-22 (×4): qty 1

## 2024-05-22 MED ORDER — ONDANSETRON HCL 4 MG PO TABS: 4.0000 mg | ORAL_TABLET | Freq: Four times a day (QID) | ORAL | Status: DC | PRN

## 2024-05-22 MED ORDER — VANCOMYCIN HCL IN DEXTROSE 1-5 GM/200ML-% IV SOLN
1000.0000 mg | Freq: Once | INTRAVENOUS | Status: AC
  Administered 2024-05-22: 1000 mg via INTRAVENOUS
  Filled 2024-05-22: qty 200

## 2024-05-22 MED ORDER — ROSUVASTATIN CALCIUM 20 MG PO TABS
20.0000 mg | ORAL_TABLET | Freq: Every day | ORAL | Status: DC
  Administered 2024-05-23 – 2024-05-26 (×4): 20 mg via ORAL
  Filled 2024-05-22 (×4): qty 1

## 2024-05-22 MED ORDER — CLOPIDOGREL BISULFATE 75 MG PO TABS
75.0000 mg | ORAL_TABLET | Freq: Every day | ORAL | Status: DC
  Administered 2024-05-23 – 2024-05-26 (×4): 75 mg via ORAL
  Filled 2024-05-22 (×4): qty 1

## 2024-05-22 MED ORDER — SODIUM CHLORIDE 0.9 % IV SOLN
2.0000 g | Freq: Once | INTRAVENOUS | Status: AC
  Administered 2024-05-22: 2 g via INTRAVENOUS
  Filled 2024-05-22: qty 12.5

## 2024-05-22 MED ORDER — DULOXETINE HCL 30 MG PO CPEP
30.0000 mg | ORAL_CAPSULE | Freq: Two times a day (BID) | ORAL | Status: DC
  Administered 2024-05-22 – 2024-05-26 (×8): 30 mg via ORAL
  Filled 2024-05-22 (×8): qty 1

## 2024-05-22 MED ORDER — ONDANSETRON HCL 4 MG/2ML IJ SOLN
4.0000 mg | Freq: Four times a day (QID) | INTRAMUSCULAR | Status: DC | PRN
  Administered 2024-05-26: 4 mg via INTRAVENOUS
  Filled 2024-05-22: qty 2

## 2024-05-22 MED ORDER — MORPHINE SULFATE (PF) 2 MG/ML IV SOLN
2.0000 mg | Freq: Once | INTRAVENOUS | Status: AC
  Administered 2024-05-22: 2 mg via INTRAVENOUS
  Filled 2024-05-22: qty 1

## 2024-05-22 NOTE — ED Provider Triage Note (Signed)
 Emergency Medicine Provider Triage Evaluation Note  Betty Golden , a 78 y.o. female  was evaluated in triage.  Pt complains of multiple complaints including right eyelid droop, severe right sided headache, nausea, vomiting, diarrhea, abdominal pain, shortness of breath, as well as fatigue.   Review of Systems  Positive: Shortness of breath, abdominal pain, diarrhea, vomiting, headache Negative: Disturbances, unilateral weakness, slurred speech difference from baseline, facial droop  Physical Exam  BP 110/69   Pulse (!) 122   Temp (!) 97.5 F (36.4 C) (Oral)   Resp (!) 21   SpO2 100%  Gen:   Awake, no distress   Resp:  Normal effort  MSK:   Moves extremities without difficulty  Other:  Facial droop not present, family member at bedside states that speech is at baseline, no unilateral weakness on neurologic exam, grossly neurologically intact, visibly anxious in triage  Medical Decision Making  Medically screening exam initiated at 4:51 PM.  Appropriate orders placed.  Betty Golden was informed that the remainder of the evaluation will be completed by another provider, this initial triage assessment does not replace that evaluation, and the importance of remaining in the ED until their evaluation is complete.  Orders: CBC, CMP, lactic acid, urinalysis, blood cultures, PT/INR, CT head, chest x-ray, EKG   Betty Terrall FALCON, PA-C 05/22/24 1701

## 2024-05-22 NOTE — Progress Notes (Signed)
 Pharmacy Antibiotic Note  Betty Golden is a 78 y.o. female admitted on 05/22/2024 with Vomiting/Diarrhea .  Pharmacy has been consulted for cefepime/vancomycin  dosing for sepsis of unknown source.  -WBC 18.7, afebrile, sCr 1.57 (bl~1) -CXR/CTabd: no acute findings  -Blood cultures collected  Plan: -Cefepime 2g IV every 24 hours  -Vancomycin  1250mg  IV every 48 hours (AUC 496, Vd 0.72, IBW, sCr 1.57) -Monitor renal function for dose adjustments  -Follow up signs of clinical improvement, LOT, de-escalation of antibiotics   Height: 5' 3 (160 cm) Weight: 68.9 kg (152 lb) IBW/kg (Calculated) : 52.4  Temp (24hrs), Avg:97.5 F (36.4 C), Min:97.5 F (36.4 C), Max:97.6 F (36.4 C)  Recent Labs  Lab 05/22/24 1648 05/22/24 1654 05/22/24 1858  WBC 18.7*  --   --   CREATININE 1.57*  --   --   LATICACIDVEN  --  4.8* 2.8*    Estimated Creatinine Clearance: 28.4 mL/min (A) (by C-G formula based on SCr of 1.57 mg/dL (H)).    Allergies  Allergen Reactions   Hydromorphone  Anaphylaxis   Sulfa Antibiotics Rash    Antimicrobials this admission: Vancomycin  10/25 >>  Cefepime 10/25 >>   Microbiology results: 10/25 BCx:   Thank you for allowing pharmacy to be a part of this patient's care.  Lynwood Poplar, PharmD, BCPS Clinical Pharmacist 05/22/2024 11:10 PM

## 2024-05-22 NOTE — ED Notes (Signed)
 Patient taken to CT at this time.

## 2024-05-22 NOTE — ED Notes (Signed)
 1st istat lact resulted 4.8, not crossing over

## 2024-05-22 NOTE — H&P (Signed)
 History and Physical    Betty Golden FMW:[id number] DOB: [birth year] DOA: 05/22/2024  PCP: Irven Ozell DEL, MD   Chief Complaint:  vomiting, diarrhea  HPI: Betty Golden is a 78 y.o. female with medical history significant of prior stroke, CAD, depression, hypertension who presented to the emergency department due to nausea, vomiting and diarrhea.  Patient's family is concerned that she been sick for the last week so they brought her to the ER for further assessment.  On arrival she was afebrile and tachycardic in the 120s and hemodynamically stable.  She was endorsing some abdominal pain.  Of note she was recently hospitalized in October with chest pain and heart palpitation.  She has a history of rheumatoid arthritis as well as CHF.  She was thought to have community-acquired pneumonia and was treated for sepsis.  She was discharged on antibiotics.  Workup today in the emergency department showed bicarb 18, creatinine 1.57 baseline around 1, WBC 18.7, hemoglobin 13.9, lactic acid 4.8, 2.8.  Patient had CT abdomen pelvis which showed no acute findings.  CT head showed no possible left parietal infarct.  Chest x-ray showed no acute findings.  On evaluation patient was at neurologic baseline.  She had a prior stroke with aphasia.  She was unable to provide useful history.   Review of Systems: Review of Systems  Constitutional:  Negative for chills and fever.  HENT: Negative.    Eyes: Negative.   Respiratory: Negative.    Cardiovascular: Negative.   Gastrointestinal:  Positive for abdominal pain, nausea and vomiting.  Genitourinary: Negative.   Musculoskeletal: Negative.   Skin: Negative.   Neurological: Negative.   Endo/Heme/Allergies: Negative.   Psychiatric/Behavioral: Negative.    All other systems reviewed and are negative.    As per HPI otherwise 10 point review of systems negative.   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 difficile colitis    Cervical stenosis of spine    Chronic headaches    Chronic neck pain    Colitis due to Clostridium difficile 02/10/2015   Coronary artery disease    Depression    Diverticulosis    Essential hypertension    Fibromyalgia    GERD (gastroesophageal reflux disease)    History of CVA (cerebrovascular accident) 05/06/2024   History of Salmonella gastroenteritis    HNP (herniated nucleus pulposus), lumbar    Hyperlipidemia    IBS (irritable bowel syndrome)    Lung nodule    rheumatoid arthritis     Past Surgical History:  Procedure Laterality Date   APPENDECTOMY  1965   BIOPSY  08/26/2018   Procedure: BIOPSY;  Surgeon: Golda Claudis PENNER, MD;  Location: [facility];  Service: Endoscopy;;  gastric   BREAST BIOPSY     BREAST REDUCTION SURGERY  1994   CATARACT EXTRACTION W/PHACO Left 06/09/2023   Procedure: CATARACT EXTRACTION PHACO AND INTRAOCULAR LENS PLACEMENT (IOC);  Surgeon: Harrie Agent, MD;  Location: [facility];  Service: Ophthalmology;  Laterality: Left;  CDE 7.03   CATARACT EXTRACTION W/PHACO Right 06/23/2023   Procedure: CATARACT EXTRACTION PHACO AND INTRAOCULAR LENS PLACEMENT (IOC);  Surgeon: Harrie Agent, MD;  Location: [facility];  Service: Ophthalmology;  Laterality: Right;  CDE: 11.55   CHOLECYSTECTOMY  1975   COLONOSCOPY N/A 08/19/2013   Procedure: COLONOSCOPY;  Surgeon: Claudis RAYMOND Rivet, MD;  Location: [facility];  Service: Endoscopy;  Laterality: N/A;  155-moved to 140 Ann to notify pt   COLONOSCOPY N/A 08/26/2018   Procedure: COLONOSCOPY;  Surgeon: Rivet Claudis RAYMOND, MD;  Location: [facility];  Service: Endoscopy;  Laterality: N/A;   ESOPHAGOGASTRODUODENOSCOPY N/A 08/26/2018   Procedure: ESOPHAGOGASTRODUODENOSCOPY (EGD);  Surgeon: Rivet Claudis RAYMOND, MD;  Location: [facility];  Service: Endoscopy;  Laterality: N/A;    fibromyalgia     LUMBAR LAMINECTOMY/DECOMPRESSION MICRODISCECTOMY Bilateral 09/20/2015   Procedure: MICRO LUMBAR DECOMPRESSION L5-S1 BILATERAL    (1 LEVEL);  Surgeon: Reyes Billing, MD;  Location: [facility];  Service: Orthopedics;  Laterality: Bilateral;   rheumatoid arthritis     Sinus Surgery     TONSILLECTOMY  1968     reports that she quit smoking about 35 years ago. Her smoking use included cigarettes. She started smoking about 45 years ago. She has a 20 pack-year smoking history. She has never used smokeless tobacco. She reports that she does not drink alcohol and does not use drugs.  Family History  Problem Relation Age of Onset   Heart disease Mother    Hypertension Mother    Asthma Mother    Heart disease Father    Stroke Father    Hypertension Father    Stroke Sister    Asthma Sister    Hypertension Brother    Asthma Brother    Asthma Maternal Grandmother    Heart disease Maternal Grandmother    Diabetes Maternal Grandfather    Asthma Maternal Aunt    Colon cancer Neg Hx     Prior to Admission medications   Medication Sig Start Date End Date Taking? Authorizing Provider  acetaminophen  (TYLENOL ) 325 MG tablet Take 1-2 tablets (325-650 mg total) by mouth every 4 (four) hours as needed for mild pain (pain score 1-3). Patient taking differently: Take 650 mg by mouth daily as needed for mild pain (pain score 1-3) or moderate pain (pain score 4-6). 04/19/24   Love, Sharlet RAMAN, PA-C  albuterol  (PROAIR  HFA) 108 (90 Base) MCG/ACT inhaler Inhale 2 puffs into the lungs every 4 (four) hours as needed for wheezing or shortness of breath. 04/16/23   Iva Marty Saltness, MD  amLODipine  (NORVASC ) 10 MG tablet Take 1 tablet (10 mg total) by mouth daily. 04/28/24   Love, Sharlet RAMAN, PA-C  benralizumab  (FASENRA ) 30 MG/ML prefilled syringe Inject 1 mL (30 mg total) into the skin every 8 (eight) weeks. 07/31/23   Iva Marty Saltness, MD  Certolizumab Pegol (CIMZIA [place]) Inject 1 Dose into the skin  every 14 (fourteen) days.    [provider]  clindamycin (CLEOCIN T) 1 % lotion Apply 1 Application topically 2 (two) times daily.    [provider]  clopidogrel  (PLAVIX ) 75 MG tablet Take 1 tablet (75 mg total) by mouth daily. 04/28/24 07/27/24  Maurice Sharlet RAMAN, PA-C  DULoxetine  (CYMBALTA ) 30 MG capsule Take 1 capsule (30 mg total) by mouth 2 (two) times daily. 04/28/24   Love, Sharlet RAMAN, PA-C  ferrous sulfate 325 (65 FE) MG EC tablet Take 325 mg by mouth daily.    [provider]  Fluticasone -Umeclidin-Vilant (TRELEGY ELLIPTA ) 200-62.5-25 MCG/ACT AEPB Inhale 1 puff into the lungs in the morning. Patient taking differently: Inhale 2 puffs into the lungs in the morning. 10/15/23   Iva Marty Saltness, MD  leflunomide  (ARAVA ) 20 MG tablet Take 20 mg by mouth daily.    [provider]  melatonin 5 MG TABS Take 1 tablet (5 mg total) by mouth at bedtime. 04/28/24   Love, Sharlet RAMAN, PA-C  metoprolol  succinate (TOPROL  XL) 50 MG 24 hr tablet Take 1 tablet (50 mg total) by mouth daily. 05/09/24 06/08/24  Laurence Locus, DO  pantoprazole  (PROTONIX ) 40 MG tablet Take 1 tablet (40 mg total) by mouth daily. 04/28/24   Love, Sharlet RAMAN, PA-C  potassium chloride  (KLOR-CON  M) 10 MEQ tablet Take 1 tablet (10 mEq total) by mouth daily. 05/09/24   Laurence Locus, DO  rosuvastatin  (CRESTOR ) 20 MG tablet Take 1 tablet (20 mg total) by mouth daily. 04/28/24   Love, Sharlet RAMAN, PA-C  saccharomyces boulardii (FLORASTOR) 250 MG capsule Take 1 capsule (250 mg total) by mouth 2 (two) times daily. 05/09/24 06/08/24  Laurence Locus, DO  traZODone  (DESYREL ) 50 MG tablet Take 0.5 tablets (25 mg total) by mouth at bedtime. 04/28/24   Maurice Sharlet RAMAN, PA-C    Physical Exam: Vitals:   05/22/24 1845 05/22/24 2100 05/22/24 2130 05/22/24 2224  BP: (!) 150/86 (!) 142/70 (!) 174/69   Pulse: (!) 112 (!) 114 (!) 113   Resp: 18 (!) 40 (!) 38   Temp:    (!) 97.5 F (36.4 C)  TempSrc:    Oral  SpO2: 100% 100% 100%     Physical Exam Constitutional:      Appearance: She is normal weight.  HENT:     Head: Normocephalic.     Nose: Nose normal.     Mouth/Throat:     Mouth: Mucous membranes are moist.     Pharynx: Oropharynx is clear.  Eyes:     Conjunctiva/sclera: Conjunctivae normal.     Pupils: Pupils are equal, round, and reactive to light.  Cardiovascular:     Rate and Rhythm: Normal rate and regular rhythm.     Pulses: Normal pulses.     Heart sounds: Normal heart sounds.  Pulmonary:     Effort: Pulmonary effort is normal.     Breath sounds: Normal breath sounds.  Abdominal:     General: Abdomen is flat. Bowel sounds are normal.  Musculoskeletal:        General: Normal range of motion.     Cervical back: Normal range of motion.  Skin:    General: Skin is warm.     Capillary Refill: Capillary refill takes less than 2 seconds.  Neurological:     Mental Status: She is alert and oriented to person, place, and time. Mental status is at baseline.  Psychiatric:        Mood and Affect: Mood normal.        Behavior: Behavior normal.        Labs on Admission: I have personally reviewed the patients's labs and imaging studies.  Assessment/Plan Principal Problem:   Sepsis (HCC)   # Severe sepsis, unclear etiology - Patient had lactic acidosis 4.8 which responded to fluids - Started on vancomycin , cefepime, Flagyl  in emergency department - Acute kidney injury with creatinine 1.5 baseline around 1  Plan: Continue volume resuscitation Continue vancomycin , cefepime Obtain CTA PE study to further assess etiology of sepsis FU UA FU Bcx Trend lactic acid  # Hypertensive urgency-resume home amlodipine , metoprolol   # Hyperlipidemia-continue rosuvastatin   # GERD-continue Protonix   # Depression-continue trazodone   # Rheumatoid arthritis-continue Arava   # Depression-continue Cymbalta   # History of stroke-continue home Plavix .  Patient was found to have possible infarct on CT.  MRI  ordered to further assess.  Patient  is at neurologic baseline and does not appear to have any new focal deficits.   Admission status: Inpatient Telemetry Medical  Certification: The appropriate patient status for this patient is INPATIENT. Inpatient status is judged to be reasonable and necessary in order to provide the required intensity of service to ensure the patient's safety. The patient's presenting symptoms, physical exam findings, and initial radiographic and laboratory data in the context of their chronic comorbidities is felt to place them at high risk for further clinical deterioration. Furthermore, it is not anticipated that the patient will be medically stable for discharge from the hospital within 2 midnights of admission.   * I certify that at the point of admission it is my clinical judgment that the patient will require inpatient hospital care spanning beyond 2 midnights from the point of admission due to high intensity of service, high risk for further deterioration and high frequency of surveillance required.DEWAINE Lamar Dess MD [facility] If 7PM-7AM, please contact night-coverage [email or web address]  05/22/2024, 10:45 PM

## 2024-05-22 NOTE — ED Provider Notes (Addendum)
 [place] EMERGENCY DEPARTMENT AT [facility] Provider Note   CSN: [id number] Arrival date & time: 05/22/24  1523     Patient presents with: No chief complaint on file.   Betty Golden is a 78 y.o. female.   Patient with vomiting since last evening.  And last vomited about 2 in the afternoon.  She has had diarrhea according to family members for about a week.  Still complaining of a right sided headache that is getting worse.  There are some question of right eye drooping but family thinks that is okay now.  Patient's vital signs temp 97.5 with a heart rate of 122 respirations 21 blood pressure 110/69 oxygen sats on room air is 100%.  Patient also complaining of abdominal pain.  And patient's vital signs here is noted pulse 122 respirations 21.  Denies any blood in the vomit or diarrhea.  Patient with recent hospitalization was admitted October 9 discharged October 12.  That admission was for chest pains and palpitations.  Patient has a family history of significant CVA September 2025.  With chronic CHF rheumatoid arthritis fibromyalgia hypertension COPD.  Patient presented to the emergency department with palpitation was not feeling well for 2 days at that time and mid gastric abdominal pain patient also reported chest pain with cough and deep breathing.  At the presentation in the ED she was tachycardic with a heart rate of 122 and tachypneic to 31.  Oxygen sats were good.  Count is similar to what was seen today.  There is concern for sepsis in the setting of a community-acquired pneumonia pleuritic chest pain due to pneumonia elevated troponin secondary to demand ischemia.  Abscess due to pneumonia main reason for the admission she was discharged home on Duricef and Zithromax .  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  So discharge self possible problem with sepsis due to pneumonia essential hypertension COPD rheumatoid arthritis and expressive aphasia due to prior  CVA.       Prior to Admission medications   Medication Sig Start Date End Date Taking? Authorizing Provider  acetaminophen  (TYLENOL ) 325 MG tablet Take 1-2 tablets (325-650 mg total) by mouth every 4 (four) hours as needed for mild pain (pain score 1-3). Patient taking differently: Take 650 mg by mouth daily as needed for mild pain (pain score 1-3) or moderate pain (pain score 4-6). 04/19/24   Love, Sharlet RAMAN, PA-C  albuterol  (PROAIR  HFA) 108 (90 Base) MCG/ACT inhaler Inhale 2 puffs into the lungs every 4 (four) hours as needed for wheezing or shortness of breath. 04/16/23   Iva Marty Saltness, MD  amLODipine  (NORVASC ) 10 MG tablet Take 1 tablet (10 mg total) by mouth daily. 04/28/24   Love, Sharlet RAMAN, PA-C  benralizumab  (FASENRA ) 30 MG/ML prefilled syringe Inject 1 mL (30 mg total) into the skin every 8 (eight) weeks. 07/31/23   Iva Marty Saltness, MD  Certolizumab Pegol (CIMZIA [place]) Inject 1 Dose into the skin every 14 (fourteen) days.    [provider]  clindamycin (CLEOCIN T) 1 % lotion Apply 1 Application topically 2 (two) times daily.    [provider]  clopidogrel  (PLAVIX ) 75 MG tablet Take 1 tablet (75 mg total) by mouth daily. 04/28/24 07/27/24  Love, Sharlet RAMAN, PA-C  DULoxetine  (CYMBALTA ) 30 MG capsule Take 1 capsule (30 mg total) by mouth 2 (two) times daily. 04/28/24   Love, Sharlet RAMAN, PA-C  ferrous sulfate 325 (65 FE) MG EC tablet Take 325 mg by  mouth daily.    [provider]  Fluticasone -Umeclidin-Vilant (TRELEGY ELLIPTA ) 200-62.5-25 MCG/ACT AEPB Inhale 1 puff into the lungs in the morning. Patient taking differently: Inhale 2 puffs into the lungs in the morning. 10/15/23   Iva Marty Saltness, MD  leflunomide  (ARAVA ) 20 MG tablet Take 20 mg by mouth daily.    [provider]  melatonin 5 MG TABS Take 1 tablet (5 mg total) by mouth at bedtime. 04/28/24   Love, Sharlet RAMAN, PA-C  metoprolol  succinate (TOPROL  XL) 50 MG 24 hr tablet Take 1 tablet (50 mg  total) by mouth daily. 05/09/24 06/08/24  Laurence Locus, DO  pantoprazole  (PROTONIX ) 40 MG tablet Take 1 tablet (40 mg total) by mouth daily. 04/28/24   Love, Sharlet RAMAN, PA-C  potassium chloride  (KLOR-CON  M) 10 MEQ tablet Take 1 tablet (10 mEq total) by mouth daily. 05/09/24   Laurence Locus, DO  rosuvastatin  (CRESTOR ) 20 MG tablet Take 1 tablet (20 mg total) by mouth daily. 04/28/24   Love, Sharlet RAMAN, PA-C  saccharomyces boulardii (FLORASTOR) 250 MG capsule Take 1 capsule (250 mg total) by mouth 2 (two) times daily. 05/09/24 06/08/24  Laurence Locus, DO  traZODone  (DESYREL ) 50 MG tablet Take 0.5 tablets (25 mg total) by mouth at bedtime. 04/28/24   Love, Sharlet RAMAN, PA-C    Allergies: Hydromorphone  and Sulfa antibiotics    Review of Systems  Constitutional:  Negative for chills and fever.  HENT:  Negative for ear pain and sore throat.   Eyes:  Negative for pain and visual disturbance.  Respiratory:  Negative for cough and shortness of breath.   Cardiovascular:  Negative for chest pain and palpitations.  Gastrointestinal:  Positive for abdominal pain, diarrhea, nausea and vomiting.  Genitourinary:  Negative for dysuria and hematuria.  Musculoskeletal:  Negative for arthralgias and back pain.  Skin:  Negative for color change and rash.  Neurological:  Positive for headaches. Negative for seizures and syncope.  All other systems reviewed and are negative.   Updated Vital Signs BP 110/69   Pulse (!) 122   Temp (!) 97.5 F (36.4 C) (Oral)   Resp (!) 21   SpO2 100%   Physical Exam Vitals and nursing note reviewed.  Constitutional:      General: She is not in acute distress.    Appearance: Normal appearance. She is well-developed.  HENT:     Head: Normocephalic and atraumatic.     Mouth/Throat:     Mouth: Mucous membranes are dry.  Eyes:     Extraocular Movements: Extraocular movements intact.     Conjunctiva/sclera: Conjunctivae normal.     Pupils: Pupils are equal, round, and reactive to light.   Cardiovascular:     Rate and Rhythm: Normal rate and regular rhythm.     Heart sounds: No murmur heard. Pulmonary:     Effort: Pulmonary effort is normal. No respiratory distress.     Breath sounds: Normal breath sounds.  Abdominal:     Palpations: Abdomen is soft.     Tenderness: There is no abdominal tenderness.  Musculoskeletal:        General: No swelling.     Cervical back: Normal range of motion and neck supple.  Skin:    General: Skin is warm and dry.     Capillary Refill: Capillary refill takes less than 2 seconds.  Neurological:     General: No focal deficit present.     Mental Status: She is alert and oriented to person, place, and  time.     Cranial Nerves: Cranial nerve deficit present.     Sensory: No sensory deficit.     Motor: No weakness.     Comments: Some expressive aphasia baseline  Psychiatric:        Mood and Affect: Mood normal.     (all labs ordered are listed, but only abnormal results are displayed) Labs Reviewed  COMPREHENSIVE METABOLIC PANEL WITH GFR - Abnormal; Notable for the following components:      Result Value   CO2 15 (*)    Glucose, Bld 167 (*)    Creatinine, Ser 1.57 (*)    GFR, Estimated 34 (*)    Anion gap 16 (*)    All other components within normal limits  CBC WITH DIFFERENTIAL/PLATELET - Abnormal; Notable for the following components:   WBC 18.7 (*)    Neutro Abs 12.8 (*)    Lymphs Abs 4.7 (*)    Abs Immature Granulocytes 0.10 (*)    All other components within normal limits  CULTURE, BLOOD (ROUTINE X 2)  CULTURE, BLOOD (ROUTINE X 2)  PROTIME-INR  URINALYSIS, W/ REFLEX TO CULTURE (INFECTION SUSPECTED)  I-STAT CG4 LACTIC ACID, ED  I-STAT CG4 LACTIC ACID, ED    EKG: EKG Interpretation Date/Time:  Saturday May 22 2024 16:17:16 EDT Ventricular Rate:  123 PR Interval:  152 QRS Duration:  78 QT Interval:  320 QTC Calculation: 458 R Axis:   29  Text Interpretation: Sinus tachycardia Right atrial enlargement Possible  Anterolateral infarct , age undetermined Abnormal ECG When compared with ECG of 06-May-2024 17:10, PREVIOUS ECG IS PRESENT No significant change since last tracing Confirmed by Leondro Coryell [phone]) on 05/22/2024 5:23:48 PM  Radiology: DG Chest 2 View if patient is not in a treatment room. Result Date: 05/22/2024 CLINICAL DATA:  Suspected sepsis. Vomiting, diarrhea, and headache. Shortness of breath. EXAM: CHEST - 2 VIEW COMPARISON:  05/06/2024. FINDINGS: The heart size and mediastinal contours are within normal limits. There is stable scarring at the left lung base. No consolidation, effusion, or pneumothorax is seen. A stable sclerotic lesion is noted in the scapula on the right, likely bone island. No acute osseous abnormality is seen. IMPRESSION: Stable chest with no active cardiopulmonary disease. Electronically Signed   By: Leita Birmingham M.D.   On: 05/22/2024 17:33     Procedures   Medications Ordered in the ED - No data to display                                  Medical Decision Making Amount and/or Complexity of Data Reviewed Labs: ordered. Radiology: ordered.  Risk Prescription drug management.   Complete metabolic panel GFR 34 want to compare that to old creatinine 1.57.  LFTs normal electrolytes otherwise normal.  There is an anion gap and CO2 is 15.  CBC white count 18,000 hemoglobin 13.9 and platelets 222 INR normal at 1 blood cultures are pending.  2 view chest without any acute findings.  Patient had head CT ordered as well.  And is pending.  Will add on CT scan abdomen and pelvis.  Patient also with all the diarrhea that started about a week ago based on what she was discharged on could be C. difficile.  Will order test for that.  Currently significant drop in patient's GFR from the 50s to 34.  Consistent with dehydration may be some acute kidney injury.  May require admission for  that.  Based on that we will do CT scan with reduce IV the contrast load.  His x-ray  was clear.  Has a history of chronic CHF.  But we will go ahead and give some gentle fluids.  Patient initial lactic acid will collected at about 1700 was 4.8.  Repeat at 1900 was 2.8.  Patient had marked elevation in white blood cell count as well.  We have already gone over the acute kidney injury.  Blood cultures pending CT head increased conspicuously of the left parietal white hypodensity likely evolving subacute infarct MRI could be done to look at this further.  Feel patient probably needs admission so they can evaluate that during admission.  CT scan abdomen and pelvis no acute findings.  Sigmoid diverticulosis.  Nothing to explain the diarrhea.  Urinalysis pending.  But will contact hospitalist for admission.  Patient's elevated white blood cell count and the lactic acid is somewhat concerning.  So we will start with some broad-spectrum antibiotics.  Source is not known.  But will initiate sepsis criteria.  Final diagnoses:  Generalized abdominal pain  Nausea vomiting and diarrhea  Dehydration    ED Discharge Orders     None          Geraldene Hamilton, MD 05/22/24 1752    Geraldene Hamilton, MD 05/22/24 1754    Geraldene Hamilton, MD 05/22/24 2023

## 2024-05-22 NOTE — ED Notes (Signed)
 CCMD contacted to place pt on cardiac monitoring

## 2024-05-22 NOTE — ED Triage Notes (Signed)
 Pt here POV with reports of vomiting, diarrhea, R sided headache progressively worsening. Family noticed R eye drooping today. Pt appears to be short of breath in triage.

## 2024-05-22 NOTE — Sepsis Progress Note (Addendum)
 Elink following for sepsis protocol, Sepsis called at 20:56   1st LA 16:54 f/b 2LA at 18:58, BC done at 16:26

## 2024-05-23 ENCOUNTER — Inpatient Hospital Stay (HOSPITAL_COMMUNITY)

## 2024-05-23 DIAGNOSIS — R6520 Severe sepsis without septic shock: Secondary | ICD-10-CM | POA: Diagnosis not present

## 2024-05-23 DIAGNOSIS — I69341 Monoplegia of lower limb following cerebral infarction affecting right dominant side: Secondary | ICD-10-CM

## 2024-05-23 DIAGNOSIS — I639 Cerebral infarction, unspecified: Secondary | ICD-10-CM | POA: Diagnosis not present

## 2024-05-23 DIAGNOSIS — A419 Sepsis, unspecified organism: Secondary | ICD-10-CM | POA: Diagnosis not present

## 2024-05-23 LAB — COMPREHENSIVE METABOLIC PANEL WITH GFR
ALT: 17 U/L (ref 0–44)
AST: 26 U/L (ref 15–41)
Albumin: 3.3 g/dL — ABNORMAL LOW (ref 3.5–5.0)
Alkaline Phosphatase: 58 U/L (ref 38–126)
Anion gap: 14 (ref 5–15)
BUN: 12 mg/dL (ref 8–23)
CO2: 16 mmol/L — ABNORMAL LOW (ref 22–32)
Calcium: 9.2 mg/dL (ref 8.9–10.3)
Chloride: 108 mmol/L (ref 98–111)
Creatinine, Ser: 1.22 mg/dL — ABNORMAL HIGH (ref 0.44–1.00)
GFR, Estimated: 46 mL/min — ABNORMAL LOW (ref >60)
Glucose, Bld: 107 mg/dL — ABNORMAL HIGH (ref 70–99)
Potassium: 3.7 mmol/L (ref 3.5–5.1)
Sodium: 138 mmol/L (ref 135–145)
Total Bilirubin: 0.9 mg/dL (ref 0.0–1.2)
Total Protein: 6.2 g/dL — ABNORMAL LOW (ref 6.5–8.1)

## 2024-05-23 LAB — URINALYSIS, W/ REFLEX TO CULTURE (INFECTION SUSPECTED)
Bacteria, UA: NONE SEEN
Bilirubin Urine: NEGATIVE
Glucose, UA: NEGATIVE mg/dL
Hgb urine dipstick: NEGATIVE
Ketones, ur: 5 mg/dL — AB
Leukocytes,Ua: NEGATIVE
Nitrite: NEGATIVE
Protein, ur: NEGATIVE mg/dL
Specific Gravity, Urine: 1.02 (ref 1.005–1.030)
pH: 6 (ref 5.0–8.0)

## 2024-05-23 LAB — VITAMIN D 25 HYDROXY (VIT D DEFICIENCY, FRACTURES): Vit D, 25-Hydroxy: 39.03 ng/mL (ref 30–100)

## 2024-05-23 LAB — SEDIMENTATION RATE: Sed Rate: 19 mm/h (ref 0–22)

## 2024-05-23 MED ORDER — IOHEXOL 350 MG/ML SOLN
75.0000 mL | Freq: Once | INTRAVENOUS | Status: AC | PRN
  Administered 2024-05-23: 75 mL via INTRAVENOUS

## 2024-05-23 MED ORDER — ASPIRIN 81 MG PO TBEC
81.0000 mg | DELAYED_RELEASE_TABLET | Freq: Every day | ORAL | Status: DC
  Administered 2024-05-23 – 2024-05-26 (×4): 81 mg via ORAL
  Filled 2024-05-23 (×3): qty 1

## 2024-05-23 MED ORDER — ALBUTEROL SULFATE (2.5 MG/3ML) 0.083% IN NEBU: 2.5000 mg | INHALATION_SOLUTION | RESPIRATORY_TRACT | Status: DC | PRN

## 2024-05-23 MED ORDER — ENOXAPARIN SODIUM 40 MG/0.4ML IJ SOSY
40.0000 mg | PREFILLED_SYRINGE | INTRAMUSCULAR | Status: DC
  Administered 2024-05-24 – 2024-05-26 (×2): 40 mg via SUBCUTANEOUS
  Filled 2024-05-23 (×3): qty 0.4

## 2024-05-23 MED ORDER — STROKE: EARLY STAGES OF RECOVERY BOOK
Freq: Once | Status: AC
  Filled 2024-05-23: qty 1

## 2024-05-23 MED ORDER — LORAZEPAM 2 MG/ML IJ SOLN
1.0000 mg | Freq: Once | INTRAMUSCULAR | Status: AC
  Administered 2024-05-23: 1 mg via INTRAVENOUS
  Filled 2024-05-23: qty 1

## 2024-05-23 NOTE — ED Notes (Signed)
 Pt resting in bed, admit. RN at bedside no needs at this time

## 2024-05-23 NOTE — ED Notes (Signed)
 Pt requesting Tylenol  for a HA

## 2024-05-23 NOTE — ED Notes (Signed)
 RN assisted pt on BSC. Pt denies dizziness but was slightly unsteady while transferring to [facility]

## 2024-05-23 NOTE — Plan of Care (Signed)

## 2024-05-23 NOTE — ED Notes (Signed)
 Attempted to place an IV without success

## 2024-05-23 NOTE — ED Notes (Signed)
 Patient taken to MRI at this time

## 2024-05-23 NOTE — Consult Note (Signed)
 NEUROLOGY CONSULT NOTE   Date of service: May 23, 2024 Patient Name: Betty Golden MRN:  [id number] DOB:  11/11/1945 Chief Complaint: acute stroke Requesting Provider: Sonjia Held, MD  History of Present Illness  Betty Golden is a 78 y.o. female with hx of prior CVA with residual mild right leg weakness, aphasia and dysarthria, RA, CHF, CAD, HLD who presented to ED 10/25 due to n/v, diarrhea for about a week. In the ED patient was afebrile but tachycardic.  In the past patient was admitted for pneumonia and was discharged home on oral antibiotics.  On this presentation, labs were remarkable for  bicarb 18, creatinine 1.57 baseline around 1, WBC 18.7, hemoglobin 13.9, lactic acid 4.8,   Patient had CT abdomen pelvis which showed no acute findings.  CT head showed no possible left parietal infarct.  Chest x-ray showed no acute findings.  Urinalysis was negative.  Patient was then considered for admission to the hospital for further evaluation and treatment.     MRI of the brain showed incidental small acute infarct in the posterior left cerebellum. Neurology consulted for acute stroke.   On exam, patient has expressive aphasia and apraxia, dysarthria, right leg weakness, which family at bedside states is pretty much her usual. Patient is on Plavix  at home. However, it seems that she should have been taking both Aspirin  and Plavix  for 3 months after after being diagnosed with white matter ischemic infarct in September. CT Angio at this time showed new left M2 stenosis. She was refusing the MRI and was outside of the window for mechanical thrombectomy intervention.     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 difficile colitis    Cervical stenosis of spine    Chronic headaches    Chronic neck pain     Colitis due to Clostridium difficile 02/10/2015   Coronary artery disease    Depression    Diverticulosis    Essential hypertension    Fibromyalgia    GERD (gastroesophageal reflux disease)    History of CVA (cerebrovascular accident) 05/06/2024   History of Salmonella gastroenteritis    HNP (herniated nucleus pulposus), lumbar    Hyperlipidemia    IBS (irritable bowel syndrome)    Lung nodule    rheumatoid arthritis     Past Surgical History:  Procedure Laterality Date   APPENDECTOMY  1965   BIOPSY  08/26/2018   Procedure: BIOPSY;  Surgeon: Golda Claudis PENNER, MD;  Location: [facility];  Service: Endoscopy;;  gastric   BREAST BIOPSY     BREAST REDUCTION SURGERY  1994   CATARACT EXTRACTION W/PHACO Left 06/09/2023   Procedure: CATARACT EXTRACTION PHACO AND INTRAOCULAR LENS PLACEMENT (IOC);  Surgeon: Harrie Agent, MD;  Location: [facility];  Service: Ophthalmology;  Laterality: Left;  CDE 7.03   CATARACT EXTRACTION W/PHACO Right 06/23/2023   Procedure: CATARACT EXTRACTION PHACO AND INTRAOCULAR LENS PLACEMENT (IOC);  Surgeon: Harrie Agent, MD;  Location: [facility];  Service: Ophthalmology;  Laterality: Right;  CDE: 11.55   CHOLECYSTECTOMY  1975   COLONOSCOPY N/A 08/19/2013   Procedure: COLONOSCOPY;  Surgeon: Claudis PENNER Golda, MD;  Location: [facility];  Service: Endoscopy;  Laterality: N/A;  155-moved to 140 Ann to notify pt   COLONOSCOPY N/A 08/26/2018   Procedure: COLONOSCOPY;  Surgeon:  Golda Claudis PENNER, MD;  Location: [facility];  Service: Endoscopy;  Laterality: N/A;   ESOPHAGOGASTRODUODENOSCOPY N/A 08/26/2018   Procedure: ESOPHAGOGASTRODUODENOSCOPY (EGD);  Surgeon: Golda Claudis PENNER, MD;  Location: [facility];  Service: Endoscopy;  Laterality: N/A;   fibromyalgia     LUMBAR LAMINECTOMY/DECOMPRESSION MICRODISCECTOMY Bilateral 09/20/2015   Procedure: MICRO LUMBAR DECOMPRESSION L5-S1 BILATERAL    (1 LEVEL);  Surgeon: Reyes Billing, MD;  Location: [facility];  Service: Orthopedics;   Laterality: Bilateral;   rheumatoid arthritis     Sinus Surgery     TONSILLECTOMY  1968    Family History: Family History  Problem Relation Age of Onset   Heart disease Mother    Hypertension Mother    Asthma Mother    Heart disease Father    Stroke Father    Hypertension Father    Stroke Sister    Asthma Sister    Hypertension Brother    Asthma Brother    Asthma Maternal Grandmother    Heart disease Maternal Grandmother    Diabetes Maternal Grandfather    Asthma Maternal Aunt    Colon cancer Neg Hx     Social History  reports that she quit smoking about 35 years ago. Her smoking use included cigarettes. She started smoking about 45 years ago. She has a 20 pack-year smoking history. She has never used smokeless tobacco. She reports that she does not drink alcohol and does not use drugs.  Allergies  Allergen Reactions   Hydromorphone  Anaphylaxis   Sulfa Antibiotics Rash    Medications   Current Facility-Administered Medications:    0.9 %  sodium chloride  infusion, , Intravenous, Continuous, Dorrell, Robert, MD, Last Rate: 75 mL/hr at 05/23/24 1446, New Bag at 05/23/24 1446   acetaminophen  (TYLENOL ) tablet 650 mg, 650 mg, Oral, Q6H PRN, 650 mg at 05/23/24 1038 **OR** acetaminophen  (TYLENOL ) suppository 650 mg, 650 mg, Rectal, Q6H PRN, Dena Charleston, MD   albuterol  (PROVENTIL ) (2.5 MG/3ML) 0.083% nebulizer solution 2.5 mg, 2.5 mg, Nebulization, Q4H PRN, Pokhrel, Laxman, MD   amLODipine  (NORVASC ) tablet 10 mg, 10 mg, Oral, Daily, Dorrell, Robert, MD, 10 mg at 05/23/24 0935   ceFEPIme (MAXIPIME) 2 g in sodium chloride  0.9 % 100 mL IVPB, 2 g, Intravenous, Q24H, Laron Agent, RPH   clopidogrel  (PLAVIX ) tablet 75 mg, 75 mg, Oral, Daily, Dorrell, Robert, MD, 75 mg at 05/23/24 9065   DULoxetine  (CYMBALTA ) DR capsule 30 mg, 30 mg, Oral, BID, Dorrell, Robert, MD, 30 mg at 05/23/24 0934   [START ON 05/24/2024] enoxaparin  (LOVENOX ) injection 40 mg, 40 mg, Subcutaneous, Q24H,  Pokhrel, Laxman, MD   [COMPLETED] lactated ringers  bolus 1,000 mL, 1,000 mL, Intravenous, Once, Stopped at 05/22/24 2308 **AND** [COMPLETED] lactated ringers  bolus 1,000 mL, 1,000 mL, Intravenous, Once, Stopped at 05/22/24 2353 **AND** lactated ringers  bolus 250 mL, 250 mL, Intravenous, Once, Dena Charleston, MD, Held at 05/23/24 9686   lactated ringers  infusion, , Intravenous, Continuous, Dorrell, Robert, MD, Last Rate: 150 mL/hr at 05/23/24 0743, Rate Verify at 05/23/24 0743   leflunomide  (ARAVA ) tablet 20 mg, 20 mg, Oral, Daily, Dorrell, Robert, MD   metoprolol  succinate (TOPROL -XL) 24 hr tablet 50 mg, 50 mg, Oral, Daily, Dorrell, Robert, MD, 50 mg at 05/23/24 9065   ondansetron  (ZOFRAN ) tablet 4 mg, 4 mg, Oral, Q6H PRN **OR** ondansetron  (ZOFRAN ) injection 4 mg, 4 mg, Intravenous, Q6H PRN, Dorrell, Robert, MD   oxyCODONE  (Oxy IR/ROXICODONE ) immediate release tablet 5 mg, 5 mg, Oral, Q4H PRN, Dena Charleston, MD  pantoprazole  (PROTONIX ) EC tablet 40 mg, 40 mg, Oral, Daily, Dorrell, Robert, MD, 40 mg at 05/23/24 9065   rosuvastatin  (CRESTOR ) tablet 20 mg, 20 mg, Oral, Daily, Dorrell, Robert, MD, 20 mg at 05/23/24 0935   traZODone  (DESYREL ) tablet 25 mg, 25 mg, Oral, QHS, Dorrell, Robert, MD, 25 mg at 05/30/24 2311   [START ON 05/24/2024] vancomycin  (VANCOREADY) IVPB 1250 mg/250 mL, 1,250 mg, Intravenous, Q48H, Laron Agent, RPH  Vitals   Vitals:   05/23/24 1113 05/23/24 1200 05/23/24 1217 05/23/24 1234  BP:  (!) 148/62  (!) 148/81  Pulse:  100  (!) 102  Resp:  17  18  Temp: 98.1 F (36.7 C)  98.1 F (36.7 C) (!) 97.4 F (36.3 C)  TempSrc: Oral  Oral Oral  SpO2:  100%  99%  Weight:      Height:        Body mass index is 26.93 kg/m.   Physical Exam   Constitutional: Appears well-developed and well-nourished.   Neurologic Examination   Neuro: Mental Status: Patient is awake, alert, oriented to person, place, month, year. She does have mild expressive aphasia and apraxia of  speech with slower speaking and awareness of her errors. She understands conversation and responds appropriately.  Cranial Nerves: II: Visual Fields are full. Pupils are equal, round, and reactive to light.   III,IV, VI: EOMI without ptosis or diploplia.  V: Facial sensation is symmetric to light touch. VII: Facial movement is symmetric.  VIII: hearing is intact to voice X: Uvula elevates symmetrically. Slight dysarthria.  XI: Shoulder shrug is symmetric. XII: tongue is midline without atrophy or fasciculations.  Motor: Tone is normal. Bulk is normal.  LLE: 4+/5, no drift RLE: 4/5, slight drift Sensory: Sensation is symmetric to light touch and temperature in the arms and legs. Cerebellar: FNF intact bilaterally  NIH: 3   Labs/Imaging/Neurodiagnostic studies   CBC:  Recent Labs  Lab 2024-05-30 1648  WBC 18.7*  NEUTROABS 12.8*  HGB 13.9  HCT 43.1  MCV 85.9  PLT 228   Basic Metabolic Panel:  Lab Results  Component Value Date   NA 138 05/23/2024   K 3.7 05/23/2024   CO2 16 (L) 05/23/2024   GLUCOSE 107 (H) 05/23/2024   BUN 12 05/23/2024   CREATININE 1.22 (H) 05/23/2024   CALCIUM  9.2 05/23/2024   GFRNONAA 46 (L) 05/23/2024   GFRAA 54 (L) 04/21/2019   Lipid Panel:  Lab Results  Component Value Date   LDLCALC 98 04/14/2024   HgbA1c:  Lab Results  Component Value Date   HGBA1C 4.7 (L) 04/13/2024   Urine Drug Screen:     Component Value Date/Time   LABOPIA POSITIVE (A) 04/12/2024 1407   COCAINSCRNUR NONE DETECTED 04/12/2024 1407   LABBENZ NONE DETECTED 04/12/2024 1407   AMPHETMU NONE DETECTED 04/12/2024 1407   THCU NONE DETECTED 04/12/2024 1407   LABBARB NONE DETECTED 04/12/2024 1407    Alcohol Level     Component Value Date/Time   ETH <15 04/12/2024 1434   INR  Lab Results  Component Value Date   INR 1.0 05-30-24   APTT  Lab Results  Component Value Date   APTT 31 04/16/2019   AED levels: No results found for: PHENYTOIN, ZONISAMIDE,  LAMOTRIGINE, LEVETIRACETA  CT Head without contrast(Personally reviewed): Increased conspicuity of left parietal white hypodensity, likely evolving subacute infarct.  CT angio Head and Neck with contrast(Personally reviewed): pending  MRI Brain(Personally reviewed): Small acute infarct in the posterior left cerebellum. Late subacute  posterior MCA territory infarcts, left > right, corresponding to CT finding. No hemorrhagic transformation or mass effect.   ASSESSMENT   Betty Golden is a 78 y.o. female with hx of prior CVA with residual mild right leg weakness, aphasia and dysarthria, RA, CHF, CAD, HLD who presented to ED 10/25 due to n/v, diarrhea for about a week.    MRI of the brain showed incidental small acute infarct in the posterior left cerebellum. Neurology consulted for acute stroke.  Previous imaging completed in September showed left M2 stenosis. Will want to get another CTA to evaluate all vessels, but especially this stenosis. Stroke workup ordered.   Acute Ischemic Infarct, left cerebellum  RECOMMENDATIONS  - Frequent Neuro checks per stroke unit protocol - MRI Brain stroke protocol - Vascular imaging - CT angio - TTE was completed 10/10, no need to repeat - Lipid panel (LDL 98 1 month ago), repeat to evaluate medication effect  Crestor  20mg  PTA  - Statin - will be increased if LDL>70 or otherwise medically indicated - A1C: 4.7 1 month aogo no need to repeat - Antithrombotic - Aspirin  and Plavix , likely for 3 months due to previously seen intracranial stenosis. Stroke team will determine duration based on workup - DVT ppx - lovenox  - SBP goal -normotensive as she is outside the permissive hypertension window - Telemetry monitoring for arrhythmia - 72h - Swallow screen - will be performed prior to PO intake - Stroke education - will be given - PT/OT - SLP. Patient endorsed some difficulty swallowing recently.   - dementia panel, vitamin labs ordered due  to family's concern over patient's poor PO intake over the past few months.   ______________________________________________________________________    Signed, Rocky JAYSON Likes, NP [facility]    NEUROHOSPITALIST ADDENDUM Performed a face to face diagnostic evaluation.   I have reviewed the contents of history and physical exam as documented by PA/ARNP/Resident and agree with above documentation.  I have discussed and formulated the above plan as documented. Edits to the note have been made as needed.  Layla Kesling, MD [facility] [phone]   If 7pm to 7am, please call on call as listed on AMION.

## 2024-05-23 NOTE — Progress Notes (Addendum)
 PROGRESS NOTE  Betty Golden FMW:[id number] DOB: 03/09/1946 DOA: 05/22/2024 PCP: Irven Ozell DEL, MD   LOS: 1 day   Brief narrative:  Betty Golden is a 78 y.o. female with medical history significant of prior stroke, rheumatoid arthritis, congestive heart failure CAD, depression, hypertension presented to hospital with nausea vomiting and diarrhea for a week or so.  In the ED patient was afebrile but tachycardic.  In the past patient was admitted for pneumonia and was discharged home on oral antibiotics.  On this presentation, labs were remarkable for  bicarb 18, creatinine 1.57 baseline around 1, WBC 18.7, hemoglobin 13.9, lactic acid 4.8,   Patient had CT abdomen pelvis which showed no acute findings.  CT head showed no possible left parietal infarct.  Chest x-ray showed no acute findings.  Urinalysis was negative.  Patient was then considered for admission to the hospital for further evaluation and treatment.    Assessment/Plan: Principal Problem:   Sepsis (HCC)  Severe sepsis like presentation.  Unclear etiology. Patient had tachycardia, tachypnea on presentation with temperature of 97.5 degrees and significant lactic acidosis on presentation up to 4.8 with responded with IV fluids.  Was started on vancomycin , cefepime and Flagyl  in the ED.  Temperature max of 97.7 F.  Still with tachycardia.  Follow-up blood cultures GI pathogen panel C. difficile.  Urinalysis with negative findings.  CT scan of the abdomen and pelvis with contrast did not show any acute findings.  There was some sigmoid diverticulosis.  CT angiogram of the chest showed no pulmonary embolism but some emphysema.  No obvious source of infection was identified.  Will de-escalate antibiotic by tomorrow if cultures negative    Acute kidney injury with creatinine 1.5 on presentation.  Baseline creatinine around 1.  Continue IV fluids.  Currently on normal saline 75 mL/h.  Creatinine today at 1.2.  Check BMP in AM.      Hypertensive urgency Continue amlodipine , metoprolol . blood pressure seems to be stable at this time.   History of stroke with new acute stroke in the posterior cerebellum.-continue Plavix .  MRI of the brain showed a small acute infarct in the posterior left cerebellum.  Communicated with neurology on-call for consultation.  Will get PT OT speech therapy.  Review of 2D echocardiogram from 05/07/2024 showed LV ejection fraction of 70 to 75% with no regional wall motion abnormality with grade 1 diastolic dysfunction.  Will follow neurology recommendations.  Hyperlipidemia continue rosuvastatin    GERD continue Protonix   History of COPD.  Compensated.  Continue bronchodilators.   Rheumatoid arthritis-continue Arava     Depression-continue Cymbalta , trazodone     DVT prophylaxis: enoxaparin  (LOVENOX ) injection 30 mg Start: 05/23/24 1000 SCDs Start: 05/22/24 2245   Disposition: Home with home health  Status is: Inpatient Remains inpatient appropriate because: Pending clinical improvement, IV antibiotics, incidental stroke    Code Status:     Code Status: Full Code  Family Communication: None at bedside  Consultants: Neurology  Procedures: None  Anti-infectives:  Vancomycin  and cefepime IV  Anti-infectives (From admission, onward)    Start     Dose/Rate Route Frequency Ordered Stop   05/24/24 2000  vancomycin  (VANCOREADY) IVPB 1250 mg/250 mL        1,250 mg 166.7 mL/hr over 90 Minutes Intravenous Every 48 hours 05/22/24 2312     05/23/24 2000  ceFEPIme (MAXIPIME) 2 g in sodium chloride  0.9 % 100 mL IVPB        2 g 200 mL/hr over 30 Minutes Intravenous  Every 24 hours 05/22/24 2312     05/22/24 2030  ceFEPIme (MAXIPIME) 2 g in sodium chloride  0.9 % 100 mL IVPB        2 g 200 mL/hr over 30 Minutes Intravenous  Once 05/22/24 2026 05/22/24 2308   05/22/24 2030  metroNIDAZOLE  (FLAGYL ) IVPB 500 mg        500 mg 100 mL/hr over 60 Minutes Intravenous  Once 05/22/24 2026  05/22/24 2308   05/22/24 2030  vancomycin  (VANCOCIN ) IVPB 1000 mg/200 mL premix        1,000 mg 200 mL/hr over 60 Minutes Intravenous  Once 05/22/24 2026 05/22/24 2353        Subjective: Today, patient was seen and examined at bedside.  Patient states that he feels okay.  Mild nausea but no overt abdominal pain.  Has not eaten much.  Has not had bowel movement or gas since yesterday.  Had some headache and dizziness earlier in the day today.  No current headache.  Objective: Vitals:   05/23/24 1217 05/23/24 1234  BP:  (!) 148/81  Pulse:  (!) 102  Resp:  18  Temp: 98.1 F (36.7 C) (!) 97.4 F (36.3 C)  SpO2:  99%    Intake/Output Summary (Last 24 hours) at 05/23/2024 1320 Last data filed at 05/22/2024 2353 Gross per 24 hour  Intake 1200 ml  Output --  Net 1200 ml   Filed Weights   05/22/24 2224  Weight: 68.9 kg   Body mass index is 26.93 kg/m.   Physical Exam: GENERAL: Patient is alert awake and oriented. Not in obvious distress. HENT: No scleral pallor or icterus. Pupils equally reactive to light. Oral mucosa is slightly dry NECK: is supple, no gross swelling noted. CHEST: Clear to auscultation. No crackles or wheezes.   CVS: S1 and S2 heard, no murmur. Regular rate and rhythm.  ABDOMEN: Soft, non-tender, bowel sounds are present. EXTREMITIES: No edema. CNS: Mild expressive aphasia at baseline. SKIN: warm and dry without rashes.  Data Review: I have personally reviewed the following laboratory data and studies,  CBC: Recent Labs  Lab 05/22/24 1648  WBC 18.7*  NEUTROABS 12.8*  HGB 13.9  HCT 43.1  MCV 85.9  PLT 228   Basic Metabolic Panel: Recent Labs  Lab 05/22/24 1648 05/23/24 0452  NA 140 138  K 4.9 3.7  CL 109 108  CO2 15* 16*  GLUCOSE 167* 107*  BUN 14 12  CREATININE 1.57* 1.22*  CALCIUM  10.3 9.2   Liver Function Tests: Recent Labs  Lab 05/22/24 1648 05/23/24 0452  AST 32 26  ALT 22 17  ALKPHOS 66 58  BILITOT 1.1 0.9  PROT 7.6  6.2*  ALBUMIN 3.9 3.3*   No results for input(s): LIPASE, AMYLASE in the last 168 hours. No results for input(s): AMMONIA in the last 168 hours. Cardiac Enzymes: No results for input(s): CKTOTAL, CKMB, CKMBINDEX, TROPONINI in the last 168 hours. BNP (last 3 results) No results for input(s): BNP in the last 8760 hours.  ProBNP (last 3 results) No results for input(s): PROBNP in the last 8760 hours.  CBG: No results for input(s): GLUCAP in the last 168 hours. Recent Results (from the past 240 hours)  Culture, blood (Routine x 2)     Status: None (Preliminary result)   Collection Time: 05/22/24  4:26 PM   Specimen: BLOOD RIGHT ARM  Result Value Ref Range Status   Specimen Description BLOOD RIGHT ARM  Final   Special Requests  Final    BOTTLES DRAWN AEROBIC AND ANAEROBIC Blood Culture adequate volume   Culture   Final    NO GROWTH < 24 HOURS Performed at [facility], [address], [place], [place] [address]    Report Status PENDING  Incomplete  Culture, blood (Routine x 2)     Status: None (Preliminary result)   Collection Time: 05/22/24  4:47 PM   Specimen: BLOOD LEFT ARM  Result Value Ref Range Status   Specimen Description BLOOD LEFT ARM  Final   Special Requests   Final    AEROBIC BOTTLE ONLY Blood Culture results may not be optimal due to an inadequate volume of blood received in culture bottles   Culture   Final    NO GROWTH < 24 HOURS Performed at [facility], [address] [place], [place] [address]    Report Status PENDING  Incomplete     Studies: MR BRAIN WO CONTRAST Result Date: 05/23/2024 EXAM: MRI BRAIN WITHOUT CONTRAST 05/23/2024 05:44:21 AM TECHNIQUE: Multiplanar multisequence MRI of the head/brain was performed without the administration of intravenous contrast. COMPARISON: Head CT from yesterday and Brain MRI 02/27/2024. CLINICAL HISTORY: 78 year old female with possible subacute infarct. FINDINGS: BRAIN AND VENTRICLES:  Late subacute posterior left MCA territory infarcts left greater than right corresponding to CT finding. Patchy and confluent abnormal diffusion, T2 and FLAIR hyperintensity in the posterior left MCA territory, lateral perirolandic area and underlying white matter. Changes are mostly facilitated on diffusion. Cystic encephalomalacia developing. 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 Smaller area of similar abnormal signal in the contralateral posterior right periatrial white matter (series 11 image 29), new from the MRI in August. Superimposed small linear focus of restricted diffusion in the posterior left cerebellum (series 5 image 55). Minimal if any T2 and FLAIR hyperintensity there. No other abnormal diffusion identified. No hemorrhagic transformation or mass effect at any of these sites. Stable gray and white matter signal since August, including chronic T2 heterogeneity in the basal ganglia and possible tiny chronic lacunar infarct in the right cerebellar hemisphere. No chronic cortical encephalomalacia. No chronic cerebral blood products. No intracranial hemorrhage. No mass effect. No midline shift. No hydrocephalus. The sella is unremarkable. Major vascular flow voids are stable. ORBITS: No acute abnormality. SINUSES AND MASTOIDS: No acute abnormality. BONES AND SOFT TISSUES: Normal marrow signal. No acute soft tissue abnormality. IMPRESSION: 1. Small acute infarct in the posterior left cerebellum. 2. And Late subacute posterior MCA territory infarcts, left > right, corresponding to CT finding. 3. No hemorrhagic transformation or mass effect. Electronically signed by: Helayne Hurst MD 05/23/2024 05:55 AM EDT RP Workstation: [id number]   CT Angio Chest Pulmonary Embolism (PE) W or WO Contrast Result Date: 05/22/2024 EXAM: CTA of the Chest with contrast for PE 05/22/2024 11:46:35 PM TECHNIQUE: CTA of the chest was performed without and with the administration of  75 mL of iohexol  (OMNIPAQUE ) 350 MG/ML injection. Multiplanar reformatted images are provided for review. MIP images are provided for review. Automated exposure control, iterative reconstruction, and/or weight based adjustment of the mA/kV was utilized to reduce the radiation dose to as low as reasonably achievable. COMPARISON: None available. CLINICAL HISTORY: Concern for PE. SOB. Ordering provider gave the ok for more contrast. FINDINGS: PULMONARY ARTERIES: Pulmonary arteries are adequately opacified for evaluation. No pulmonary embolism. Main pulmonary artery is normal in caliber. MEDIASTINUM: The heart and pericardium demonstrate no acute abnormality. There are atherosclerotic calcifications of the aorta.  LYMPH NODES: No mediastinal, hilar or axillary lymphadenopathy. LUNGS AND PLEURA: Mild emphysema present. There is some stable scarring in both upper lobes. No focal consolidation or pulmonary edema. No pleural effusion or pneumothorax. UPPER ABDOMEN: Limited images of the upper abdomen are unremarkable. SOFT TISSUES AND BONES: No acute bone or soft tissue abnormality. IMPRESSION: 1. No pulmonary embolism. 2. Mild emphysema and stable scarring in both upper lobes. 3. Atherosclerotic calcifications of the aorta. Consider correlation with cardiovascular risk factors and management as clinically indicated. Electronically signed by: Greig Pique MD 05/22/2024 11:52 PM EDT RP Workstation: [id number]   CT Head Wo Contrast Result Date: 05/22/2024 EXAM: CT HEAD WITHOUT CONTRAST 05/22/2024 07:15:10 PM TECHNIQUE: CT of the head was performed without the administration of intravenous contrast. Automated exposure control, iterative reconstruction, and/or weight based adjustment of the mA/kV was utilized to reduce the radiation dose to as low as reasonably achievable. COMPARISON: CT head Sept 15, 2025 CLINICAL HISTORY: Headache. Betty Golden, a 78 y.o. female was evaluated in triage. Pt complains of multiple  complaints including right eyelid droop, severe right sided headache, nausea, vomiting, diarrhea, abdominal pain, shortness of breath, as well as fatigue. FINDINGS: BRAIN AND VENTRICLES: No acute hemorrhage. 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 Similar additional patchy white matter hypodensities, compatible with chronic microvascular ischemic change. No hydrocephalus. No extra-axial collection. No mass effect or midline shift. ORBITS: No acute abnormality. SINUSES: No acute abnormality. SOFT TISSUES AND SKULL: No acute soft tissue abnormality. No skull fracture. IMPRESSION: 1. 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 An MRI could further evaluate if clinically warranted. No mass effect. Electronically signed by: Gilmore Molt MD 05/22/2024 07:41 PM EDT RP Workstation: [id number]   CT ABDOMEN PELVIS W CONTRAST Result Date: 05/22/2024 EXAM: CT ABDOMEN AND PELVIS WITH CONTRAST 05/22/2024 07:15:10 PM TECHNIQUE: CT of the abdomen and pelvis was performed with the administration of 75 mL of iohexol  (OMNIPAQUE ) 350 MG/ML injection. Multiplanar reformatted images are provided for review. Automated exposure control, iterative reconstruction, and/or weight-based adjustment of the mA/kV was utilized to reduce the radiation dose to as low as reasonably achievable. COMPARISON: None available. CLINICAL HISTORY: Abdominal pain, acute, nonlocalized; Nausea vomiting diarrhea. FINDINGS: LOWER CHEST: No acute abnormality. LIVER: The liver is unremarkable. GALLBLADDER AND BILE DUCTS: Status post cholecystectomy. Mild dilatation of the common duct, measuring 11 mm and smoothly tapering of the ampulla, likely postsurgical. SPLEEN: No acute abnormality. PANCREAS: No acute abnormality. ADRENAL GLANDS: No acute abnormality. KIDNEYS, URETERS AND BLADDER: Multifocal cortical scarring of the right kidney with multiple renal cysts  measuring up to 19 mm, benign. No follow-up is recommended. No stones in the kidneys or ureters. No hydronephrosis. No perinephric or periureteral stranding. Urinary bladder is unremarkable. GI AND BOWEL: Stomach demonstrates no acute abnormality. Sigmoid diverticulosis without convincing pericolonic stranding to suggest acute diverticulitis. There is no bowel obstruction. PERITONEUM AND RETROPERITONEUM: No ascites. No free air. VASCULATURE: Atherosclerotic calcifications of the abdominal aorta and branch vessels, although patent. LYMPH NODES: No lymphadenopathy. REPRODUCTIVE ORGANS: Uterus is unremarkable. BONES AND SOFT TISSUES: Degenerative changes of the visualized thoracolumbar spine. Mild degenerative changes of the bilateral hips. No acute osseous abnormality. No focal soft tissue abnormality. IMPRESSION: 1. No acute findings. 2. Sigmoid diverticulosis, without convincing findings to suggest acute diverticulitis. 3. Additional ancillary findings as above. Electronically signed by: Pinkie Pebbles MD 05/22/2024 07:31 PM EDT RP Workstation: [id number]   DG Chest 2 View if patient is not in a  treatment room. Result Date: 05/22/2024 CLINICAL DATA:  Suspected sepsis. Vomiting, diarrhea, and headache. Shortness of breath. EXAM: CHEST - 2 VIEW COMPARISON:  05/06/2024. FINDINGS: The heart size and mediastinal contours are within normal limits. There is stable scarring at the left lung base. No consolidation, effusion, or pneumothorax is seen. A stable sclerotic lesion is noted in the scapula on the right, likely bone island. No acute osseous abnormality is seen. IMPRESSION: Stable chest with no active cardiopulmonary disease. Electronically Signed   By: Leita Birmingham M.D.   On: 05/22/2024 17:33      Vernal Alstrom, MD  [facility] 05/23/2024  If 7PM-7AM, please contact night-coverage

## 2024-05-23 NOTE — ED Notes (Signed)
 RN assisted pt on side of bed to eat breakfast

## 2024-05-23 NOTE — ED Notes (Signed)
Pt removed IV by accident.

## 2024-05-23 NOTE — Hospital Course (Signed)
 Betty Golden is a 78 y.o. female with medical history significant of prior stroke, rheumatoid arthritis, congestive heart failure CAD, depression, hypertension presented to hospital with nausea vomiting and diarrhea for a week or so.  In the ED patient was afebrile but tachycardic.  In the past patient was admitted for pneumonia and was discharged home on oral antibiotics.  On this presentation labs were remarkable for  bicarb 18, creatinine 1.57 baseline around 1, WBC 18.7, hemoglobin 13.9, lactic acid 4.8,   Patient had CT abdomen pelvis which showed no acute findings.  CT head showed no possible left parietal infarct.  Chest x-ray showed no acute findings.  Urinalysis was negative.  Patient was then considered for admission to the hospital for further evaluation and treatment.  Patient with history of aphasia so difficult to obtain history.   Severe sepsis.  Unclear etiology. Patient had significant lactic acidosis on presentation up to 4.8 with responded with IV fluids.  Was started on vancomycin  cefepime and Flagyl  in the ED.  Temperature max of 97.7 F.  Still with tachycardia.  Follow-up blood cultures GI pathogen panel C. difficile.  Urinalysis with negative findings.  CT scan of the abdomen and pelvis with contrast did not show any acute findings.  There was some sigmoid diverticulosis.  CT angiogram of the chest showed no pulmonary embolism but some emphysema.  No obvious source of infection was identified.   Acute kidney injury with creatinine 1.5 on presentation.  Baseline creatinine around 1.  Continue volume resuscitation.  Creatinine today at 1.2.      Hypertensive urgency Continue amlodipine , metoprolol    Hyperlipidemia continue rosuvastatin    GERD continue Protonix    Rheumatoid arthritis-continue Arava     Depression-continue Cymbalta , trazodone    History of stroke-continue Plavix .  MRI has been ordered since possible infarct on the CT scan.  Patient at neurological  baseline without any new focal deficits.  Has aphasia at baseline.

## 2024-05-24 ENCOUNTER — Inpatient Hospital Stay (HOSPITAL_COMMUNITY)

## 2024-05-24 DIAGNOSIS — T1490XA Injury, unspecified, initial encounter: Secondary | ICD-10-CM | POA: Diagnosis not present

## 2024-05-24 DIAGNOSIS — Z7902 Long term (current) use of antithrombotics/antiplatelets: Secondary | ICD-10-CM

## 2024-05-24 DIAGNOSIS — A419 Sepsis, unspecified organism: Secondary | ICD-10-CM | POA: Diagnosis not present

## 2024-05-24 DIAGNOSIS — E785 Hyperlipidemia, unspecified: Secondary | ICD-10-CM

## 2024-05-24 DIAGNOSIS — I6340 Cerebral infarction due to embolism of unspecified cerebral artery: Secondary | ICD-10-CM

## 2024-05-24 LAB — TSH: TSH: 2.566 u[IU]/mL (ref 0.350–4.500)

## 2024-05-24 LAB — CBC
HCT: 32.2 % — ABNORMAL LOW (ref 36.0–46.0)
Hemoglobin: 10.5 g/dL — ABNORMAL LOW (ref 12.0–15.0)
MCH: 28.6 pg (ref 26.0–34.0)
MCHC: 32.6 g/dL (ref 30.0–36.0)
MCV: 87.7 fL (ref 80.0–100.0)
Platelets: 151 K/uL (ref 150–400)
RBC: 3.67 MIL/uL — ABNORMAL LOW (ref 3.87–5.11)
RDW: 14.3 % (ref 11.5–15.5)
WBC: 11.3 K/uL — ABNORMAL HIGH (ref 4.0–10.5)
nRBC: 0 % (ref 0.0–0.2)

## 2024-05-24 LAB — BASIC METABOLIC PANEL WITH GFR
Anion gap: 13 (ref 5–15)
BUN: 5 mg/dL — ABNORMAL LOW (ref 8–23)
CO2: 19 mmol/L — ABNORMAL LOW (ref 22–32)
Calcium: 8.7 mg/dL — ABNORMAL LOW (ref 8.9–10.3)
Chloride: 109 mmol/L (ref 98–111)
Creatinine, Ser: 1 mg/dL (ref 0.44–1.00)
GFR, Estimated: 58 mL/min — ABNORMAL LOW (ref >60)
Glucose, Bld: 73 mg/dL (ref 70–99)
Potassium: 2.9 mmol/L — ABNORMAL LOW (ref 3.5–5.1)
Sodium: 141 mmol/L (ref 135–145)

## 2024-05-24 LAB — VITAMIN B12: Vitamin B-12: 650 pg/mL (ref 180–914)

## 2024-05-24 LAB — LIPID PANEL
Cholesterol: 90 mg/dL (ref 0–200)
HDL: 46 mg/dL (ref >40)
LDL Cholesterol: 25 mg/dL (ref 0–99)
Total CHOL/HDL Ratio: 2 ratio
Triglycerides: 94 mg/dL (ref <150)
VLDL: 19 mg/dL (ref 0–40)

## 2024-05-24 LAB — RPR: RPR Ser Ql: NONREACTIVE

## 2024-05-24 LAB — FERRITIN: Ferritin: 415 ng/mL — ABNORMAL HIGH (ref 11–307)

## 2024-05-24 LAB — MAGNESIUM: Magnesium: 1.6 mg/dL — ABNORMAL LOW (ref 1.7–2.4)

## 2024-05-24 LAB — FOLATE: Folate: 16.6 ng/mL (ref >5.9)

## 2024-05-24 LAB — POTASSIUM: Potassium: 3.7 mmol/L (ref 3.5–5.1)

## 2024-05-24 MED ORDER — MAGNESIUM SULFATE 2 GM/50ML IV SOLN
2.0000 g | Freq: Once | INTRAVENOUS | Status: AC
  Administered 2024-05-24: 2 g via INTRAVENOUS
  Filled 2024-05-24: qty 50

## 2024-05-24 MED ORDER — POTASSIUM CHLORIDE CRYS ER 20 MEQ PO TBCR
40.0000 meq | EXTENDED_RELEASE_TABLET | ORAL | Status: AC
  Administered 2024-05-24 (×2): 40 meq via ORAL
  Filled 2024-05-24 (×2): qty 2

## 2024-05-24 NOTE — Progress Notes (Addendum)
 STROKE TEAM PROGRESS NOTE    SIGNIFICANT HOSPITAL EVENTS MRI of the brain showed incidental small acute infarct in the posterior left cerebellum.   INTERIM HISTORY/SUBJECTIVE No family at the bedside. Patient is confused with poor attention, can follow simple commands.  TEE scheduled for tomorrow   CBC    Component Value Date/Time   WBC 11.3 (H) 05/24/2024 0436   RBC 3.67 (L) 05/24/2024 0436   HGB 10.5 (L) 05/24/2024 0436   HGB 12.8 11/14/2023 1357   HCT 32.2 (L) 05/24/2024 0436   PLT 151 05/24/2024 0436   PLT 250 11/14/2023 1357   MCV 87.7 05/24/2024 0436   MCH 28.6 05/24/2024 0436   MCHC 32.6 05/24/2024 0436   RDW 14.3 05/24/2024 0436   LYMPHSABS 4.7 (H) 05/22/2024 1648   MONOABS 1.0 05/22/2024 1648   EOSABS 0.0 05/22/2024 1648   BASOSABS 0.1 05/22/2024 1648    BMET    Component Value Date/Time   NA 141 05/24/2024 0436   NA 143 09/08/2017 0942   K 2.9 (L) 05/24/2024 0436   CL 109 05/24/2024 0436   CO2 19 (L) 05/24/2024 0436   GLUCOSE 73 05/24/2024 0436   BUN 5 (L) 05/24/2024 0436   BUN 18 09/08/2017 0942   CREATININE 1.00 05/24/2024 0436   CREATININE 1.20 (H) 11/14/2023 1357   CALCIUM  8.7 (L) 05/24/2024 0436   GFRNONAA 58 (L) 05/24/2024 0436   GFRNONAA 47 (L) 11/14/2023 1357    IMAGING past 24 hours VAS US  LOWER EXTREMITY VENOUS (DVT) Result Date: 05/24/2024  Lower Venous DVT Study Patient Name:  Betty Golden  Date of Exam:   05/24/2024 Medical Rec #: [id number]          Accession #:    [id number] Date of Birth: 11/24/1945          Patient Gender: F Patient Age:   78 years Exam Location:  [facility] Procedure:      VAS US  LOWER EXTREMITY VENOUS (DVT) Referring Phys: ROCKY LIKES --------------------------------------------------------------------------------  Indications: Injury to blood vessels, unspecified site T14.90.  Risk Factors: None identified. Limitations: Poor ultrasound/tissue interface. Comparison Study: No prior studies. Performing  Technologist: Cordella Collet RVT  Examination Guidelines: A complete evaluation includes B-mode imaging, spectral Doppler, color Doppler, and power Doppler as needed of all accessible portions of each vessel. Bilateral testing is considered an integral part of a complete examination. Limited examinations for reoccurring indications may be performed as noted. The reflux portion of the exam is performed with the patient in reverse Trendelenburg.  +---------+---------------+---------+-----------+----------+--------------+ RIGHT    CompressibilityPhasicitySpontaneityPropertiesThrombus Aging +---------+---------------+---------+-----------+----------+--------------+ CFV      Full           Yes      Yes                                 +---------+---------------+---------+-----------+----------+--------------+ SFJ      Full                                                        +---------+---------------+---------+-----------+----------+--------------+ FV Prox  Full                                                        +---------+---------------+---------+-----------+----------+--------------+  FV Mid   Full                                                        +---------+---------------+---------+-----------+----------+--------------+ FV DistalFull                                                        +---------+---------------+---------+-----------+----------+--------------+ PFV      Full                                                        +---------+---------------+---------+-----------+----------+--------------+ POP      Full           Yes      Yes                                 +---------+---------------+---------+-----------+----------+--------------+ PTV      Full                                                        +---------+---------------+---------+-----------+----------+--------------+ PERO     Full                                                         +---------+---------------+---------+-----------+----------+--------------+   +---------+---------------+---------+-----------+----------+--------------+ LEFT     CompressibilityPhasicitySpontaneityPropertiesThrombus Aging +---------+---------------+---------+-----------+----------+--------------+ CFV      Full           Yes      Yes                                 +---------+---------------+---------+-----------+----------+--------------+ SFJ      Full                                                        +---------+---------------+---------+-----------+----------+--------------+ FV Prox  Full                                                        +---------+---------------+---------+-----------+----------+--------------+ FV Mid   Full                                                        +---------+---------------+---------+-----------+----------+--------------+  FV DistalFull                                                        +---------+---------------+---------+-----------+----------+--------------+ PFV      Full                                                        +---------+---------------+---------+-----------+----------+--------------+ POP      Full           Yes      Yes                                 +---------+---------------+---------+-----------+----------+--------------+ PTV      Full                                                        +---------+---------------+---------+-----------+----------+--------------+ PERO     Full                                                        +---------+---------------+---------+-----------+----------+--------------+     Summary: RIGHT: - There is no evidence of deep vein thrombosis in the lower extremity.  - No cystic structure found in the popliteal fossa.  LEFT: - There is no evidence of deep vein thrombosis in the lower extremity.  - No cystic structure found in  the popliteal fossa.  *See table(s) above for measurements and observations. Electronically signed by Debby Robertson on 05/24/2024 at 1:19:10 PM.    Final    CT ANGIO HEAD NECK W WO CM Result Date: 05/24/2024 EXAM: CTA Head and Neck with Intravenous Contrast. CT Head without Contrast. CLINICAL HISTORY: Stroke/TIA, determine embolic source. TECHNIQUE: Axial CTA images of the head and neck performed with intravenous contrast. MIP reconstructed images were created and reviewed. Axial computed tomography images of the head/brain performed without intravenous contrast. Note: Per PQRS, the description of internal carotid artery percent stenosis, including 0 percent or normal exam, is based on North American Symptomatic Carotid Endarterectomy Trial (NASCET) criteria. Dose reduction technique was used including one or more of the following: automated exposure control, adjustment of mA and kV according to patient size, and/or iterative reconstruction. CONTRAST: Without and with; 75mL (iohexol  (OMNIPAQUE ) 350 MG/ML injection 75 mL IOHEXOL  350 MG/ML SOLN) COMPARISON: MRI head from earlier today. CTA head and neck 04/13/2024 FINDINGS: BRAIN: No acute intraparenchymal hemorrhage. No mass lesion. 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 No midline shift or extra-axial collection. VENTRICLES: No hydrocephalus. ORBITS: The orbits are unremarkable. SINUSES AND MASTOIDS: The paranasal sinuses and mastoid air cells are clear. COMMON CAROTID ARTERIES: No significant stenosis. No dissection or occlusion. INTERNAL CAROTID ARTERIES: No stenosis by NASCET criteria. No dissection or occlusion. VERTEBRAL ARTERIES: No significant  stenosis. No dissection or occlusion. ANTERIOR CEREBRAL ARTERIES: No significant stenosis. No occlusion. No aneurysm. MIDDLE CEREBRAL ARTERIES: Similar proximal left M2 MCA occlusion. No new large vessel occlusion. Right MCA is patent. POSTERIOR CEREBRAL  ARTERIES: No significant stenosis. No occlusion. No aneurysm. BASILAR ARTERY: No significant stenosis. No occlusion. No aneurysm. LUNG APICES: Unchanged opacities in the peripheral right upper lobe, which may reflect scarring. BONES: No acute osseous abnormality. Bulky bridging osteophytes in the cervical spine. IMPRESSION: 1. No evidence of acute intracranial abnormality. 2. Similar proximal left M2 MCA occlusion. No new large vessel occlusion. Electronically signed by: Gilmore Molt MD 05/24/2024 01:11 AM EDT RP Workstation: [id number]    Vitals:   05/24/24 0009 05/24/24 0420 05/24/24 0950 05/24/24 1203  BP: (!) 143/78 (!) 133/57 (!) 148/59 131/64  Pulse: (!) 106 96 94 (!) 102  Resp: 18 18 18 20   Temp: 97.7 F (36.5 C) 97.7 F (36.5 C) (!) 97.3 F (36.3 C) 97.6 F (36.4 C)  TempSrc: Oral Oral    SpO2: 100% 98% 100% 100%  Weight:      Height:         PHYSICAL EXAM General:  Alert, well-nourished, well-developed patient in no acute distress Psych:  Mood and affect appropriate for situation CV: Regular rate and rhythm on monitor Respiratory:  Regular, unlabored respirations on room air GI: Abdomen soft and nontender   NEURO:  Mental Status: awake and alert. Able to state her name and October and hospital. Not able to answer her age, year or situation. Some aphasia. Not able to repeat but can name most objects   Cranial Nerves:  II: PERRL. Visual fields full.  III, IV, VI: EOMI. Eyelids elevate symmetrically.  V: Sensation is intact to light touch and symmetrical to face.  VII: Face is symmetrical resting and smiling VIII: hearing intact to voice. IX, X: Palate elevates symmetrically. Phonation is normal.  KP:Dynloizm shrug 5/5. XII: tongue is midline without fasciculations. Motor: all 4 extremities are antigravity and spontaneous Tone: is normal and bulk is normal Sensation- Intact to light touch bilaterally. Extinction absent to light touch to DSS.   Coordination: FTN  intact bilaterally, HKS: no ataxia in BLE.No drift.  Gait- deferred   ASSESSMENT/PLAN  Betty Golden is a 78 y.o. 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  MRI of the brain showed incidental small acute infarct in the posterior left cerebellum.     Stroke:  left punctate cerebellum infarct, late subacute left parietal infarct, etiology:  likely cardioembolic   CT head No acute abnormality, subacute left parietal infarct.  CTA head & neck similar proximal left M2 MCA occlusion. No new large vessel occlusion  MRI  Small acute infarct in the posterior left cerebellum. And Late subacute posterior MCA territory infarcts, left > right LE US  negative  2D Echo 10/10: EF 70-75% abnormal motion on the ventricular aspect of the valve, near the severe MAC  TEE scheduled for tomorrow  May consider loop recorder if workup negative LDL 25 HgbA1c 4.7 VTE prophylaxis - lovenox   clopidogrel  75 mg daily prior to admission, now on aspirin  81 mg daily and clopidogrel  75 mg daily for 3 months and then plavix  alone. Therapy recommendations: HH PT Disposition:  pending   Hx of Stroke/TIA 03/2024 admitted for altered mental status.  CT showed chronic right parietal infarct but  questionable left acute parietal infarct.  CT head and neck showed left M2 occlusion with distal reconstitution.  LDL 98, A1c 4.7.  Discharged on DAPT for 3 months and recommended 30-day CardioNet monitoring.  Hypertension Home meds:  amlodipine  10mg , metoprolol  50mg   Stable On home Norvasc  10, metoprolol  50 Long-term BP goal normotensive  Hyperlipidemia Home meds:  crestor  20mg ,  resumed in hospital LDL 25, goal < 70 Continue statin at discharge  Other Stroke Risk Factors Advanced age Coronary artery disease Congestive heart failure  Other Active Problems RA  GERD Depression   Fibromyalgia   Hospital day # 2   Karna Geralds DNP, ACNPC-AG  [facility]  ATTENDING NOTE: I reviewed above note and agree with the assessment and plan. Pt was seen and examined.   No family at bedside.  Patient sitting in bed, awake, alert, eyes open, stuttering language but able to have complete sentence especially with distraction. She did have some word finding difficulty and perseveration. Orientated to place, but not to age, told me month August instead of October. Mildly dysarthria, but prominent stuttering. Following all simple commands. Able to name 3/4 and repeat simple sentences. No gaze palsy, tracking bilaterally, visual field full, PERRL. No facial droop. Tongue midline. Bilateral UEs 5/5, no drift. Bilaterally LEs 5/5, no drift. Sensation symmetrical bilaterally, b/l FTN intact, gait not tested.   Pt was found to have incidental left cerebellar punctate infarct. However, pt does have old R parietal infarct, subacute left parietal infarct, with now left cerebellar infarct, concerning for cardioembolic source.  2D echo showed abnormal AV valve, will need TEE to rule out cardioembolic source.  If negative, may consider loop recorder.  Continue DAPT and statin.  Aggressive working with human resources officer.  PT and OT recommend home health.  Will follow.  For detailed assessment and plan, please refer to above as I have made changes wherever appropriate.   Ary Cummins, MD PhD Stroke Neurology 05/24/2024 4:35 PM    To contact Stroke Continuity provider, please refer to [email or web address]. After hours, contact General Neurology

## 2024-05-24 NOTE — Evaluation (Signed)
 Physical Therapy Evaluation Patient Details Name: Betty Golden MRN: [id number] DOB: [birth year] Today's Date: 05/24/2024  History of Present Illness  Pt is a 78 y/o female presenting to ED 10/25 due to nausea, vomiting and diarrhea. Admitted for severe sepsis of unknown origin and AKI. 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 PMH: CVA, CAD, depression, HTN, hx of C diff colitis  Clinical Impression  Evaluation limited due to asphasia from prior CVA and preoccupation with need for bowel movement. OT reported that pt lives with her daughter and moves at a modI level but needs some assistance with her ADLs now. Pt limited by poor safety awareness, poor cognition, generalized weakness and impaired balance. Pt is contact guard for transfers and ambulation due to decreased command follow, and safety awareness with use of DME. Pt will do best with return to her home environment with HHPT. PT will continue to follow acutely and will refer to Mobility Specialist.         If plan is discharge home, recommend the following: A little help with walking and/or transfers;A little help with bathing/dressing/bathroom;Assistance with cooking/housework;Assist for transportation;Help with stairs or ramp for entrance;Supervision due to cognitive status;Direct supervision/assist for medications management   Can travel by private vehicle    Yes    Equipment Recommendations None recommended by PT     Functional Status Assessment Patient has had a recent decline in their functional status and demonstrates the ability to make significant improvements in function in a reasonable and predictable amount of time.     Precautions / Restrictions Precautions Precautions: Fall Restrictions Weight Bearing Restrictions Per Provider Order: No      Mobility  Bed Mobility Overal bed mobility: Needs Assistance Bed Mobility: Supine to Sit, Sit to Supine     Supine to sit: Supervision,  HOB elevated Sit to supine: Supervision   General bed mobility comments: able to come to EoB and return with bedrail for assistance    Transfers Overall transfer level: Needs assistance Equipment used: None Transfers: Sit to/from Stand, Bed to chair/wheelchair/BSC Sit to Stand: Contact guard assist           General transfer comment: contact guard for safety with standing from bed and BSC, requires maximal multimodal cuing for hand placment on bed, BSC for power up    Ambulation/Gait Ambulation/Gait assistance: Contact guard assist Gait Distance (Feet): 10 Feet Assistive device: Rolling walker (2 wheels) Gait Pattern/deviations: Step-through pattern, Decreased stride length Gait velocity: decreased Gait velocity interpretation: <1.31 ft/sec, indicative of household ambulator   General Gait Details: pt very preoccupied with bowel urgency, in getting to [facility], constant cuing for keeping hands on RW and sequencing gait to getting back to bed     Balance Overall balance assessment: Needs assistance Sitting-balance support: Feet supported, No upper extremity supported Sitting balance-Leahy Scale: Good     Standing balance support: Bilateral upper extremity supported, During functional activity, Reliant on assistive device for balance Standing balance-Leahy Scale: Poor Standing balance comment: pt requires at least single UE support                             Pertinent Vitals/Pain Pain Assessment Pain Assessment: Faces Faces Pain Scale: Hurts whole lot Pain Location: stomach ache Pain Descriptors / Indicators: Moaning, Grimacing Pain Intervention(s): Limited activity within patient's tolerance, Monitored during session, Repositioned    Home Living Family/patient expects to be discharged to:: Private residence Living Arrangements:  Children Available Help at Discharge: Family;Available 24 hours/day Type of Home: House Home Access: Stairs to enter Entrance  Stairs-Rails: Doctor, General Practice of Steps: 1 stair to the garage with 1/2 rise to entrance is her best option oppose to front entrance   Home Layout: Two level;Able to live on main level with bedroom/bathroom;Full bath on main level Home Equipment: Rolling Walker (2 wheels);Wheelchair - manual;Cane - single point;BSC/3in1      Prior Function Prior Level of Function : Needs assist             Mobility Comments: Pt Mod I with mobility using RW at home ADLs Comments: Pt reports prior to CVA, was Mod I for ADLs but since CVA has been requiring some assist from daughter.     Extremity/Trunk Assessment   Upper Extremity Assessment Upper Extremity Assessment: Defer to OT evaluation    Lower Extremity Assessment Lower Extremity Assessment: Generalized weakness    Cervical / Trunk Assessment Cervical / Trunk Assessment: Normal  Communication   Communication Communication: Impaired Factors Affecting Communication: Difficulty expressing self;Reduced clarity of speech    Cognition Arousal: Alert Behavior During Therapy: [facility] for tasks assessed/performed     Difficult to assess due to: Impaired communication                     PT - Cognition Comments: noted to have expressive receptive aphasia Following commands: Impaired Following commands impaired: Follows one step commands with increased time     Cueing Cueing Techniques: Verbal cues, Gestural cues, Tactile cues     General Comments General comments (skin integrity, edema, etc.): Pt in obvious discomfort with need to have BM, request to go to bathroom, when in standing does not think she can make it to bathroom agrees to walk to [facility], unable to tolerate sitting on BSC, ultimately returns to bed and placed on bed pan.     Assessment/Plan    PT Assessment Patient needs continued PT services  PT Problem List Decreased activity tolerance;Decreased balance;Decreased cognition;Decreased safety  awareness;Decreased knowledge of use of DME;Pain       PT Treatment Interventions DME instruction;Gait training;Stair training;Functional mobility training;Therapeutic activities;Therapeutic exercise;Balance training;Patient/family education    PT Goals (Current goals can be found in the Care Plan section)  Acute Rehab PT Goals PT Goal Formulation: Patient unable to participate in goal setting Time For Goal Achievement: 06/07/24 Potential to Achieve Goals: Fair    Frequency Min 2X/week        AM-PAC PT 6 Clicks Mobility  Outcome Measure Help needed turning from your back to your side while in a flat bed without using bedrails?: A Little Help needed moving from lying on your back to sitting on the side of a flat bed without using bedrails?: A Little Help needed moving to and from a bed to a chair (including a wheelchair)?: A Little Help needed standing up from a chair using your arms (e.g., wheelchair or bedside chair)?: A Little Help needed to walk in hospital room?: A Little Help needed climbing 3-5 steps with a railing? : A Lot 6 Click Score: 17    End of Session Equipment Utilized During Treatment: Gait belt Activity Tolerance: Patient limited by pain;Other (comment) (need to have BM) Patient left: in bed;with call bell/phone within reach;with bed alarm set Nurse Communication: Mobility status;Other (comment) (left on bed pan) PT Visit Diagnosis: Difficulty in walking, not elsewhere classified (R26.2);Unsteadiness on feet (R26.81)    Time: 9147-9091 PT Time Calculation (min) (  ACUTE ONLY): 16 min   Charges:   PT Evaluation $PT Eval Low Complexity: 1 Low   PT General Charges $$ ACUTE PT VISIT: 1 Visit         Patsey Pitstick B. Fleeta Lapidus PT, DPT Acute Rehabilitation Services Please use secure chat or  Call Office [phone]   Almarie KATHEE Fleeta [facility] 05/24/2024, 9:29 AM

## 2024-05-24 NOTE — Progress Notes (Signed)
   [facility] has been requested to perform a transesophageal echocardiogram on Betty Golden for evaluation after CVA.SABRA    Echocardiogram from 05/07/2024 showed EF 70-75%, no wall motion abnormalities, grade 1 DD, normal RV systolic function.  There was abnormal motion on the ventricular aspect of the mitral valve near severe MAC.  Unclear if this is subvalvular apparatus or other structure.   The patient does NOT have any absolute or relative contraindications to a Transesophageal Echocardiogram (TEE).  The patient has: No other conditions that may impact this procedure.  After careful review of history and examination, the risks and benefits of transesophageal echocardiogram have been explained including risks of esophageal damage, perforation (1:10,000 risk), bleeding, pharyngeal hematoma as well as other potential complications associated with conscious sedation including aspiration, arrhythmia, respiratory failure and death. Alternatives to treatment were discussed, questions were answered. Patient is willing to proceed.   Signed, Rollo FABIENE Louder, PA-C  05/24/2024 3:04 PM

## 2024-05-24 NOTE — Anesthesia Preprocedure Evaluation (Signed)
 Anesthesia Evaluation  Patient identified by MRN, date of birth, ID band Patient awake    Reviewed: Allergy & Precautions, H&P , NPO status , Patient's Chart, lab work & pertinent test results  Airway Mallampati: II  TM Distance: >3 FB Neck ROM: Full    Dental no notable dental hx. (+) Edentulous Upper, Edentulous Lower, Dental Advisory Given   Pulmonary asthma , COPD, former smoker   Pulmonary exam normal breath sounds clear to auscultation       Cardiovascular Exercise Tolerance: Good hypertension, Pt. on medications and Pt. on home beta blockers + CAD   Rhythm:Regular Rate:Normal     Neuro/Psych  Headaches   Depression    CVA, Residual Symptoms    GI/Hepatic Neg liver ROS,GERD  Medicated,,  Endo/Other  negative endocrine ROS    Renal/GU negative Renal ROS  negative genitourinary   Musculoskeletal  (+) Arthritis , Osteoarthritis,  Fibromyalgia -  Abdominal   Peds  Hematology negative hematology ROS (+)   Anesthesia Other Findings   Reproductive/Obstetrics negative OB ROS                              Anesthesia Physical Anesthesia Plan  ASA: 3  Anesthesia Plan: MAC   Post-op Pain Management: Minimal or no pain anticipated   Induction: Intravenous  PONV Risk Score and Plan: 2 and Propofol  infusion  Airway Management Planned: Natural Airway and Simple Face Mask  Additional Equipment:   Intra-op Plan:   Post-operative Plan:   Informed Consent: I have reviewed the patients History and Physical, chart, labs and discussed the procedure including the risks, benefits and alternatives for the proposed anesthesia with the patient or authorized representative who has indicated his/her understanding and acceptance.     Dental advisory given  Plan Discussed with: CRNA  Anesthesia Plan Comments:          Anesthesia Quick Evaluation

## 2024-05-24 NOTE — Progress Notes (Signed)
 PROGRESS NOTE    Betty Golden  FMW:[id number] DOB: [birth year] DOA: 05/22/2024 PCP: Irven Ozell DEL, MD   Brief Narrative: Betty Golden is a 78 y.o. female with a history of hypertension, CAD, stroke, hyperlipidemia, depression, rheumatoid arthritis, GERD.  Patient presented secondary to nausea, vomiting and diarrhea with evidence of sepsis presumed secondary to a GI source. Blood cultures obtained and antibiotics started. GI pathogen panel and C. Difficile panel ordered and are pending. Hospitalization complicated by identification of an incidental stroke seen on MRI.   Assessment and Plan:  Severe sepsis Present on admission and presumed secondary to gastroenteritis. Patient started empirically on Vancomycin  and Cefepime. Blood cultures without growth. -Hold antibiotics for today.  Likely gastroenteritis Patient with initial nausea, vomiting and diarrhea. Symptoms have improved. No abdominal pain. Leukocytosis is improved. GI pathogen panel and C. Difficile testing are pending. -Follow-up C. Difficile and GI pathogen panel -Enteric precautions -Supportive care  Hypokalemia -Potassium supplementation as needed  AKI Likely secondary to volume loss from diarrhea and vomiting. Creatinine of 1.57 on admission. Resolved with IV fluids,  Acute stroke History of stroke Incidental. Initial CT head (10/25) significant for left parietal hypodensity. MRI brain (10/26) confirms a small acute infarct in the posterior left cerebellum in addition to late subacute posterior MCA territory infarcts correlating to CT findings. CTA head and neck significant for no acute intracranial abnormality and no new large vessel occlusion. LDL of 25. Hemoglobin A1C of 4.7%. Transthoracic Echocardiogram not obtained this admission secondary to prior Transthoracic Echocardiogram on 10/10. Neurology recommendations for Transesophageal Echocardiogram this admission with further recommendations pending.  PT/OT recommendations for home health therapy. -Continue aspirin  and Plavix  -Transesophageal Echocardiogram planned for 10/28 -Continue Crestor   Hypertensive urgency Primary hypertension -Continue amlodipine  and metoprolol   Hyperlipidemia -Continue Crestor   GERD -Continue Protonix   Depression -continue Cymbalta  and trazodone   Rheumatoid arthritis Noted.    DVT prophylaxis: Lovenox  Code Status:   Code Status: Full Code Family Communication: None at bedside Disposition Plan: Discharge home likely on 10/28 pending ongoing neurology recommendations and pending procedures   Consultants:  Neurology Cardiology  Procedures:  None  Antimicrobials: None    Subjective: Patient reports no concerns this morning other than hoping not to have many more procedures. She reports significant anxiety from the prior procedures/imaging she had.  Objective: BP 131/64 (BP Location: Left Arm)   Pulse (!) 102   Temp 97.6 F (36.4 C)   Resp 20   Ht 5' 3 (1.6 m)   Wt 68.9 kg   SpO2 100%   BMI 26.93 kg/m   Examination:  General exam: Appears very anxious. Respiratory system: Clear to auscultation. Respiratory effort normal. Cardiovascular system: S1 & S2 heard, RRR. No murmur. Gastrointestinal system: Abdomen is nondistended, soft and nontender. Normal bowel sounds heard. Central nervous system: Alert and oriented. No focal neurological deficits. Psychiatry: Judgement and insight appear normal. Mood & affect appropriate.    Data Reviewed: I have personally reviewed following labs and imaging studies  CBC Lab Results  Component Value Date   WBC 11.3 (H) 05/24/2024   RBC 3.67 (L) 05/24/2024   HGB 10.5 (L) 05/24/2024   HCT 32.2 (L) 05/24/2024   MCV 87.7 05/24/2024   MCH 28.6 05/24/2024   PLT 151 05/24/2024   MCHC 32.6 05/24/2024   RDW 14.3 05/24/2024   LYMPHSABS 4.7 (H) 05/22/2024   MONOABS 1.0 05/22/2024   EOSABS 0.0 05/22/2024   BASOSABS 0.1 05/22/2024      Last metabolic panel Lab  Results  Component Value Date   NA 141 05/24/2024   K 2.9 (L) 05/24/2024   CL 109 05/24/2024   CO2 19 (L) 05/24/2024   BUN 5 (L) 05/24/2024   CREATININE 1.00 05/24/2024   GLUCOSE 73 05/24/2024   GFRNONAA 58 (L) 05/24/2024   GFRAA 54 (L) 04/21/2019   CALCIUM  8.7 (L) 05/24/2024   PHOS 2.6 09/27/2021   PROT 6.2 (L) 05/23/2024   ALBUMIN 3.3 (L) 05/23/2024   LABGLOB 3.6 09/08/2017   BILITOT 0.9 05/23/2024   ALKPHOS 58 05/23/2024   AST 26 05/23/2024   ALT 17 05/23/2024   ANIONGAP 13 05/24/2024    GFR: Estimated Creatinine Clearance: 44.6 mL/min (by C-G formula based on SCr of 1 mg/dL).  Recent Results (from the past 240 hours)  Culture, blood (Routine x 2)     Status: None (Preliminary result)   Collection Time: 05/22/24  4:26 PM   Specimen: BLOOD RIGHT ARM  Result Value Ref Range Status   Specimen Description BLOOD RIGHT ARM  Final   Special Requests   Final    BOTTLES DRAWN AEROBIC AND ANAEROBIC Blood Culture adequate volume   Culture   Final    NO GROWTH 2 DAYS Performed at [facility], [address], [place], [place] [address]    Report Status PENDING  Incomplete  Culture, blood (Routine x 2)     Status: None (Preliminary result)   Collection Time: 05/22/24  4:47 PM   Specimen: BLOOD LEFT ARM  Result Value Ref Range Status   Specimen Description BLOOD LEFT ARM  Final   Special Requests   Final    AEROBIC BOTTLE ONLY Blood Culture results may not be optimal due to an inadequate volume of blood received in culture bottles   Culture   Final    NO GROWTH 2 DAYS Performed at [facility], [address] [place], [place] [address]    Report Status PENDING  Incomplete      Radiology Studies: VAS US  LOWER EXTREMITY VENOUS (DVT) Result Date: 05/24/2024  Lower Venous DVT Study Patient Name:  Betty Golden  Date of Exam:   05/24/2024 Medical Rec #: [id number]          Accession #:    [id number] Date of Birth: [birth year]           Patient Gender: F Patient Age:   28 years Exam Location:  [facility] Procedure:      VAS US  LOWER EXTREMITY VENOUS (DVT) Referring Phys: ROCKY LIKES --------------------------------------------------------------------------------  Indications: Injury to blood vessels, unspecified site T14.90.  Risk Factors: None identified. Limitations: Poor ultrasound/tissue interface. Comparison Study: No prior studies. Performing Technologist: Cordella Collet RVT  Examination Guidelines: A complete evaluation includes B-mode imaging, spectral Doppler, color Doppler, and power Doppler as needed of all accessible portions of each vessel. Bilateral testing is considered an integral part of a complete examination. Limited examinations for reoccurring indications may be performed as noted. The reflux portion of the exam is performed with the patient in reverse Trendelenburg.  +---------+---------------+---------+-----------+----------+--------------+ RIGHT    CompressibilityPhasicitySpontaneityPropertiesThrombus Aging +---------+---------------+---------+-----------+----------+--------------+ CFV      Full           Yes      Yes                                 +---------+---------------+---------+-----------+----------+--------------+ SFJ      Full                                                        +---------+---------------+---------+-----------+----------+--------------+  FV Prox  Full                                                        +---------+---------------+---------+-----------+----------+--------------+ FV Mid   Full                                                        +---------+---------------+---------+-----------+----------+--------------+ FV DistalFull                                                        +---------+---------------+---------+-----------+----------+--------------+ PFV      Full                                                         +---------+---------------+---------+-----------+----------+--------------+ POP      Full           Yes      Yes                                 +---------+---------------+---------+-----------+----------+--------------+ PTV      Full                                                        +---------+---------------+---------+-----------+----------+--------------+ PERO     Full                                                        +---------+---------------+---------+-----------+----------+--------------+   +---------+---------------+---------+-----------+----------+--------------+ LEFT     CompressibilityPhasicitySpontaneityPropertiesThrombus Aging +---------+---------------+---------+-----------+----------+--------------+ CFV      Full           Yes      Yes                                 +---------+---------------+---------+-----------+----------+--------------+ SFJ      Full                                                        +---------+---------------+---------+-----------+----------+--------------+ FV Prox  Full                                                        +---------+---------------+---------+-----------+----------+--------------+  FV Mid   Full                                                        +---------+---------------+---------+-----------+----------+--------------+ FV DistalFull                                                        +---------+---------------+---------+-----------+----------+--------------+ PFV      Full                                                        +---------+---------------+---------+-----------+----------+--------------+ POP      Full           Yes      Yes                                 +---------+---------------+---------+-----------+----------+--------------+ PTV      Full                                                         +---------+---------------+---------+-----------+----------+--------------+ PERO     Full                                                        +---------+---------------+---------+-----------+----------+--------------+    Summary: RIGHT: - There is no evidence of deep vein thrombosis in the lower extremity.  - No cystic structure found in the popliteal fossa.  LEFT: - There is no evidence of deep vein thrombosis in the lower extremity.  - No cystic structure found in the popliteal fossa.  *See table(s) above for measurements and observations.    Preliminary    CT ANGIO HEAD NECK W WO CM Result Date: 05/24/2024 EXAM: CTA Head and Neck with Intravenous Contrast. CT Head without Contrast. CLINICAL HISTORY: Stroke/TIA, determine embolic source. TECHNIQUE: Axial CTA images of the head and neck performed with intravenous contrast. MIP reconstructed images were created and reviewed. Axial computed tomography images of the head/brain performed without intravenous contrast. Note: Per PQRS, the description of internal carotid artery percent stenosis, including 0 percent or normal exam, is based on [employer] American Symptomatic Carotid Endarterectomy Trial (NASCET) criteria. Dose reduction technique was used including one or more of the following: automated exposure control, adjustment of mA and kV according to patient size, and/or iterative reconstruction. CONTRAST: Without and with; 75mL (iohexol  (OMNIPAQUE ) 350 MG/ML injection 75 mL IOHEXOL  350 MG/ML SOLN) COMPARISON: MRI head from earlier today. CTA head and neck 04/13/2024 FINDINGS: BRAIN: No acute intraparenchymal hemorrhage. No mass lesion. 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 No midline  shift or extra-axial collection. VENTRICLES: No hydrocephalus. ORBITS: The orbits are unremarkable. SINUSES AND MASTOIDS: The paranasal sinuses and mastoid air cells are clear. COMMON CAROTID ARTERIES: No  significant stenosis. No dissection or occlusion. INTERNAL CAROTID ARTERIES: No stenosis by NASCET criteria. No dissection or occlusion. VERTEBRAL ARTERIES: No significant stenosis. No dissection or occlusion. ANTERIOR CEREBRAL ARTERIES: No significant stenosis. No occlusion. No aneurysm. MIDDLE CEREBRAL ARTERIES: Similar proximal left M2 MCA occlusion. No new large vessel occlusion. Right MCA is patent. POSTERIOR CEREBRAL ARTERIES: No significant stenosis. No occlusion. No aneurysm. BASILAR ARTERY: No significant stenosis. No occlusion. No aneurysm. LUNG APICES: Unchanged opacities in the peripheral right upper lobe, which may reflect scarring. BONES: No acute osseous abnormality. Bulky bridging osteophytes in the cervical spine. IMPRESSION: 1. No evidence of acute intracranial abnormality. 2. Similar proximal left M2 MCA occlusion. No new large vessel occlusion. Electronically signed by: Gilmore Molt MD 05/24/2024 01:11 AM EDT RP Workstation: [id number]   MR BRAIN WO CONTRAST Result Date: 05/23/2024 EXAM: MRI BRAIN WITHOUT CONTRAST 05/23/2024 05:44:21 AM TECHNIQUE: Multiplanar multisequence MRI of the head/brain was performed without the administration of intravenous contrast. COMPARISON: Head CT from yesterday and Brain MRI 02/27/2024. CLINICAL HISTORY: 78 year old female with possible subacute infarct. FINDINGS: BRAIN AND VENTRICLES: Late subacute posterior left MCA territory infarcts left greater than right corresponding to CT finding. Patchy and confluent abnormal diffusion, T2 and FLAIR hyperintensity in the posterior left MCA territory, lateral perirolandic area and underlying white matter. Changes are mostly facilitated on diffusion. Cystic encephalomalacia developing. 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 Smaller area of similar abnormal signal in the contralateral posterior right periatrial white matter (series 11 image 29), new from the MRI in August.  Superimposed small linear focus of restricted diffusion in the posterior left cerebellum (series 5 image 55). Minimal if any T2 and FLAIR hyperintensity there. No other abnormal diffusion identified. No hemorrhagic transformation or mass effect at any of these sites. Stable gray and white matter signal since August, including chronic T2 heterogeneity in the basal ganglia and possible tiny chronic lacunar infarct in the right cerebellar hemisphere. No chronic cortical encephalomalacia. No chronic cerebral blood products. No intracranial hemorrhage. No mass effect. No midline shift. No hydrocephalus. The sella is unremarkable. Major vascular flow voids are stable. ORBITS: No acute abnormality. SINUSES AND MASTOIDS: No acute abnormality. BONES AND SOFT TISSUES: Normal marrow signal. No acute soft tissue abnormality. IMPRESSION: 1. Small acute infarct in the posterior left cerebellum. 2. And Late subacute posterior MCA territory infarcts, left > right, corresponding to CT finding. 3. No hemorrhagic transformation or mass effect. Electronically signed by: Helayne Hurst MD 05/23/2024 05:55 AM EDT RP Workstation: [id number]   CT Angio Chest Pulmonary Embolism (PE) W or WO Contrast Result Date: 05/22/2024 EXAM: CTA of the Chest with contrast for PE 05/22/2024 11:46:35 PM TECHNIQUE: CTA of the chest was performed without and with the administration of 75 mL of iohexol  (OMNIPAQUE ) 350 MG/ML injection. Multiplanar reformatted images are provided for review. MIP images are provided for review. Automated exposure control, iterative reconstruction, and/or weight based adjustment of the mA/kV was utilized to reduce the radiation dose to as low as reasonably achievable. COMPARISON: None available. CLINICAL HISTORY: Concern for PE. SOB. Ordering provider gave the ok for more contrast. FINDINGS: PULMONARY ARTERIES: Pulmonary arteries are adequately opacified for evaluation. No pulmonary embolism. Main pulmonary artery is normal in  caliber. MEDIASTINUM: The heart and pericardium demonstrate no acute abnormality. There are atherosclerotic calcifications of  the aorta. LYMPH NODES: No mediastinal, hilar or axillary lymphadenopathy. LUNGS AND PLEURA: Mild emphysema present. There is some stable scarring in both upper lobes. No focal consolidation or pulmonary edema. No pleural effusion or pneumothorax. UPPER ABDOMEN: Limited images of the upper abdomen are unremarkable. SOFT TISSUES AND BONES: No acute bone or soft tissue abnormality. IMPRESSION: 1. No pulmonary embolism. 2. Mild emphysema and stable scarring in both upper lobes. 3. Atherosclerotic calcifications of the aorta. Consider correlation with cardiovascular risk factors and management as clinically indicated. Electronically signed by: Greig Pique MD 05/22/2024 11:52 PM EDT RP Workstation: [id number]   CT Head Wo Contrast Result Date: 05/22/2024 EXAM: CT HEAD WITHOUT CONTRAST 05/22/2024 07:15:10 PM TECHNIQUE: CT of the head was performed without the administration of intravenous contrast. Automated exposure control, iterative reconstruction, and/or weight based adjustment of the mA/kV was utilized to reduce the radiation dose to as low as reasonably achievable. COMPARISON: CT head Sept 15, 2025 CLINICAL HISTORY: Headache. Betty Golden, a 78 y.o. female was evaluated in triage. Pt complains of multiple complaints including right eyelid droop, severe right sided headache, nausea, vomiting, diarrhea, abdominal pain, shortness of breath, as well as fatigue. FINDINGS: BRAIN AND VENTRICLES: No acute hemorrhage. 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 Similar additional patchy white matter hypodensities, compatible with chronic microvascular ischemic change. No hydrocephalus. No extra-axial collection. No mass effect or midline shift. ORBITS: No acute abnormality. SINUSES: No acute abnormality. SOFT TISSUES AND SKULL: No acute soft  tissue abnormality. No skull fracture. IMPRESSION: 1. 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 An MRI could further evaluate if clinically warranted. No mass effect. Electronically signed by: Gilmore Molt MD 05/22/2024 07:41 PM EDT RP Workstation: [id number]   CT ABDOMEN PELVIS W CONTRAST Result Date: 05/22/2024 EXAM: CT ABDOMEN AND PELVIS WITH CONTRAST 05/22/2024 07:15:10 PM TECHNIQUE: CT of the abdomen and pelvis was performed with the administration of 75 mL of iohexol  (OMNIPAQUE ) 350 MG/ML injection. Multiplanar reformatted images are provided for review. Automated exposure control, iterative reconstruction, and/or weight-based adjustment of the mA/kV was utilized to reduce the radiation dose to as low as reasonably achievable. COMPARISON: None available. CLINICAL HISTORY: Abdominal pain, acute, nonlocalized; Nausea vomiting diarrhea. FINDINGS: LOWER CHEST: No acute abnormality. LIVER: The liver is unremarkable. GALLBLADDER AND BILE DUCTS: Status post cholecystectomy. Mild dilatation of the common duct, measuring 11 mm and smoothly tapering of the ampulla, likely postsurgical. SPLEEN: No acute abnormality. PANCREAS: No acute abnormality. ADRENAL GLANDS: No acute abnormality. KIDNEYS, URETERS AND BLADDER: Multifocal cortical scarring of the right kidney with multiple renal cysts measuring up to 19 mm, benign. No follow-up is recommended. No stones in the kidneys or ureters. No hydronephrosis. No perinephric or periureteral stranding. Urinary bladder is unremarkable. GI AND BOWEL: Stomach demonstrates no acute abnormality. Sigmoid diverticulosis without convincing pericolonic stranding to suggest acute diverticulitis. There is no bowel obstruction. PERITONEUM AND RETROPERITONEUM: No ascites. No free air. VASCULATURE: Atherosclerotic calcifications of the abdominal aorta and branch vessels, although patent. LYMPH NODES: No lymphadenopathy. REPRODUCTIVE  ORGANS: Uterus is unremarkable. BONES AND SOFT TISSUES: Degenerative changes of the visualized thoracolumbar spine. Mild degenerative changes of the bilateral hips. No acute osseous abnormality. No focal soft tissue abnormality. IMPRESSION: 1. No acute findings. 2. Sigmoid diverticulosis, without convincing findings to suggest acute diverticulitis. 3. Additional ancillary findings as above. Electronically signed by: Pinkie Pebbles MD 05/22/2024 07:31 PM EDT RP Workstation: [id number]   DG Chest 2 View if patient is not  in a treatment room. Result Date: 05/22/2024 CLINICAL DATA:  Suspected sepsis. Vomiting, diarrhea, and headache. Shortness of breath. EXAM: CHEST - 2 VIEW COMPARISON:  05/06/2024. FINDINGS: The heart size and mediastinal contours are within normal limits. There is stable scarring at the left lung base. No consolidation, effusion, or pneumothorax is seen. A stable sclerotic lesion is noted in the scapula on the right, likely bone island. No acute osseous abnormality is seen. IMPRESSION: Stable chest with no active cardiopulmonary disease. Electronically Signed   By: Leita Birmingham M.D.   On: 05/22/2024 17:33      LOS: 2 days    Elgin Lam, MD [facility] 05/24/2024, 12:54 PM   If 7PM-7AM, please contact night-coverage [email or web address]

## 2024-05-24 NOTE — Progress Notes (Addendum)
 Transition of Care [facility]) - Inpatient Brief Assessment   Patient Details  Name: FAREN FLORENCE MRN: [id number] Date of Birth: 1946-01-03  Transition of Care [facility] CM/SW Contact:    Rosaline JONELLE Joe, RN Phone Number: 05/24/2024, 11:51 AM   Clinical Narrative: Patient admitted from home to hospital with N,V, D and severe sepsis.  No family are present in the hospital room at this time.  I called and left a detailed message with the patient's daughter to discuss setting the patient up with home health services.   DME at the home includes RW, WC, Cane and 3:1.  CM will reach back out to the daughter to set up home health services.  05/24/24 1206 - Patient's daughter returned my call and states that patient is already active with [facility] for PT, OT and ST.  The daughter would like to continue services with the agency.  HH orders will be placed for renewed services.  Daughter states that when patient is stable - that patient will return home by car.   Transition of Care Asessment: Insurance and Status: (P) Insurance coverage has been reviewed Patient has primary care physician: (P) Yes Home environment has been reviewed: (P) from home with daughter Prior level of function:: (P) Rw, WC Prior/Current Home Services: (P) No current home services Social Drivers of Health Review: (P) SDOH reviewed needs interventions Readmission risk has been reviewed: (P) Yes Transition of care needs: (P) transition of care needs identified, TOC will continue to follow

## 2024-05-24 NOTE — Plan of Care (Signed)

## 2024-05-24 NOTE — Progress Notes (Signed)
 Bilateral lower extremity venous duplex has been completed. Preliminary results can be found in CV Proc through chart review.   05/24/24 11:47 AM Cathlyn Collet RVT

## 2024-05-24 NOTE — Evaluation (Signed)
 Occupational Therapy Evaluation Patient Details Name: Betty Golden MRN: [id number] DOB: Jul 28, 1946 Today's Date: 05/24/2024   History of Present Illness   Pt is a 78 y/o female presenting to ED 10/25 due to nausea, vomiting and diarrhea. Admitted for severe sepsis of unknown origin and AKI. 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 PMH: CVA, CAD, depression, HTN, hx of C diff colitis     Clinical Impressions PTA, pt lives with daughter, reports typically Modified Independent with ADLs/mobility using RW but since recent CVA, daughter has been assisting more with ADLs. Pt presents now with deficits in strength, cognition, dynamic standing balance, endurance and abdominal discomfort. Pt requesting bathroom assist on entry; declined mobility to bathroom and opted for [facility] transfers w/ CGA though unsuccessful BM. Pt requires Setup for UB ADLs and Min A for LB ADLs. Pt declined transfer to chair at end of session. Anticipate good progress at acute level with recommendation for HHOT follow up at DC.     If plan is discharge home, recommend the following:   A little help with bathing/dressing/bathroom;Assistance with cooking/housework;Direct supervision/assist for financial management;Direct supervision/assist for medications management;Assist for transportation     Functional Status Assessment   Patient has had a recent decline in their functional status and demonstrates the ability to make significant improvements in function in a reasonable and predictable amount of time.     Equipment Recommendations   None recommended by OT     Recommendations for Other Services         Precautions/Restrictions   Precautions Precautions: Fall Restrictions Weight Bearing Restrictions Per Provider Order: No     Mobility Bed Mobility Overal bed mobility: Needs Assistance Bed Mobility: Supine to Sit     Supine to sit: Supervision, HOB elevated Sit to  supine: Supervision        Transfers Overall transfer level: Needs assistance Equipment used: None Transfers: Sit to/from Stand, Bed to chair/wheelchair/BSC Sit to Stand: Contact guard assist     Step pivot transfers: Contact guard assist     General transfer comment: to/from [facility], declined need for RW and declined to walk to bathroom with OT      Balance Overall balance assessment: Needs assistance Sitting-balance support: Feet supported, No upper extremity supported Sitting balance-Leahy Scale: Good     Standing balance support: Bilateral upper extremity supported, During functional activity, Reliant on assistive device for balance Standing balance-Leahy Scale: Poor                             ADL either performed or assessed with clinical judgement   ADL Overall ADL's : Needs assistance/impaired Eating/Feeding: Independent Eating/Feeding Details (indicate cue type and reason): anticipate no issues but NPO at time of OT eval Grooming: Set up;Sitting   Upper Body Bathing: Set up;Sitting   Lower Body Bathing: Minimal assistance;Sitting/lateral leans;Sit to/from stand   Upper Body Dressing : Set up;Sitting   Lower Body Dressing: Minimal assistance;Sitting/lateral leans;Sit to/from stand Lower Body Dressing Details (indicate cue type and reason): assist for socks for safety due to higher seat surface Toilet Transfer: Contact guard assist;Stand-pivot;BSC/3in1 Toilet Transfer Details (indicate cue type and reason): declined need for RW to complete BSC transfer- holding to [facility] armrests Toileting- Clothing Manipulation and Hygiene: Minimal assistance;Sitting/lateral lean;Sit to/from stand Toileting - Clothing Manipulation Details (indicate cue type and reason): assist for peri care in standing  Vision Baseline Vision/History: 1 Wears glasses Ability to See in Adequate Light: 0 Adequate Patient Visual Report: No change from baseline Vision  Assessment?: No apparent visual deficits     Perception         Praxis         Pertinent Vitals/Pain Pain Assessment Pain Assessment: Faces Faces Pain Scale: Hurts even more Pain Location: stomach ache Pain Descriptors / Indicators: Moaning, Grimacing Pain Intervention(s): Monitored during session, Limited activity within patient's tolerance     Extremity/Trunk Assessment Upper Extremity Assessment Upper Extremity Assessment: Generalized weakness;Right hand dominant   Lower Extremity Assessment Lower Extremity Assessment: Defer to PT evaluation   Cervical / Trunk Assessment Cervical / Trunk Assessment: Normal   Communication Communication Communication: Impaired Factors Affecting Communication: Difficulty expressing self;Reduced clarity of speech   Cognition Arousal: Alert Behavior During Therapy: WFL for tasks assessed/performed Cognition: History of cognitive impairments             OT - Cognition Comments: hx of CVA with some aphasia. word finding difficulty but able to describe around words and answer clarifying questions as needed. highly distracted by constipation and desire for water  - reoriented multiple times to NPO order in chart and written on board - messaged RN during session                 Following commands: Impaired Following commands impaired: Follows one step commands with increased time     Cueing  General Comments   Cueing Techniques: Verbal cues;Gestural cues;Tactile cues      Exercises     Shoulder Instructions      Home Living Family/patient expects to be discharged to:: Private residence Living Arrangements: Children Available Help at Discharge: Family;Available 24 hours/day Type of Home: House Home Access: Stairs to enter [employer] of Steps: 1 stair to the garage with 1/2 rise to entrance is her best option oppose to front entrance Entrance Stairs-Rails: Right;Left Home Layout: Two level;Able to live on  main level with bedroom/bathroom;Full bath on main level     Bathroom Shower/Tub: Producer, Television/film/video: Standard Bathroom Accessibility: Yes How Accessible: Accessible via walker Home Equipment: Rolling Walker (2 wheels);Wheelchair - manual;Cane - single point;BSC/3in1          Prior Functioning/Environment Prior Level of Function : Needs assist             Mobility Comments: Pt Mod I with mobility using RW at home ADLs Comments: Pt reports prior to CVA, was Mod I for ADLs but since CVA has been requiring some assist from daughter.    OT Problem List: Decreased strength;Decreased activity tolerance;Impaired balance (sitting and/or standing);Decreased cognition   OT Treatment/Interventions: Self-care/ADL training;Therapeutic exercise;Energy conservation;DME and/or AE instruction;Therapeutic activities;Patient/family education;Balance training      OT Goals(Current goals can be found in the care plan section)   Acute Rehab OT Goals Patient Stated Goal: be able to have BM and water  OT Goal Formulation: With patient Time For Goal Achievement: 06/07/24 Potential to Achieve Goals: Good ADL Goals Pt Will Perform Lower Body Bathing: sit to/from stand;sitting/lateral leans;with set-up Pt Will Transfer to Toilet: with supervision;ambulating Pt Will Perform Toileting - Clothing Manipulation and hygiene: sit to/from stand;sitting/lateral leans;with set-up   OT Frequency:  Min 2X/week    Co-evaluation              AM-PAC OT 6 Clicks Daily Activity     Outcome Measure Help from another person eating meals?: None Help from another  person taking care of personal grooming?: A Little Help from another person toileting, which includes using toliet, bedpan, or urinal?: A Little Help from another person bathing (including washing, rinsing, drying)?: A Little Help from another person to put on and taking off regular upper body clothing?: A Little Help from another  person to put on and taking off regular lower body clothing?: A Little 6 Click Score: 19   End of Session Nurse Communication: Mobility status;Other (comment) (pt asking about having water /NPO status)  Activity Tolerance: Patient tolerated treatment well Patient left: in bed;with call bell/phone within reach;with bed alarm set  OT Visit Diagnosis: Unsteadiness on feet (R26.81);Other abnormalities of gait and mobility (R26.89);Muscle weakness (generalized) (M62.81)                Time: 9243-9180 OT Time Calculation (min): 23 min Charges:  OT General Charges $OT Visit: 1 Visit OT Evaluation $OT Eval Moderate Complexity: 1 Mod  Betty Golden, OTR/L Acute Rehab Services Office: [phone]   Betty Fish 05/24/2024, 8:44 AM

## 2024-05-24 NOTE — Plan of Care (Signed)

## 2024-05-24 NOTE — Evaluation (Signed)
 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 difficile colitis    Cervical stenosis of spine    Chronic headaches    Chronic neck pain    Colitis due to Clostridium difficile 02/10/2015   Coronary artery disease    Depression    Diverticulosis    Essential hypertension    Fibromyalgia    GERD (gastroesophageal reflux disease)    History of CVA (cerebrovascular accident) 05/06/2024   History of Salmonella gastroenteritis    HNP (herniated nucleus pulposus), lumbar    Hyperlipidemia    IBS (irritable bowel syndrome)    Lung nodule    rheumatoid arthritis    Past Surgical History:  Past Surgical History:  Procedure Laterality Date   APPENDECTOMY  1965   BIOPSY  08/26/2018   Procedure: BIOPSY;  Surgeon: Golda Claudis PENNER, MD;  Location: [facility];  Service: Endoscopy;;  gastric   BREAST BIOPSY     BREAST REDUCTION SURGERY  1994   CATARACT EXTRACTION W/PHACO Left 06/09/2023   Procedure: CATARACT EXTRACTION PHACO AND INTRAOCULAR LENS PLACEMENT (IOC);  Surgeon: Harrie Agent, MD;  Location: [facility];  Service: Ophthalmology;  Laterality: Left;  CDE 7.03   CATARACT EXTRACTION W/PHACO Right 06/23/2023   Procedure: CATARACT EXTRACTION PHACO AND INTRAOCULAR LENS PLACEMENT (IOC);  Surgeon: Harrie Agent, MD;  Location: [facility];  Service: Ophthalmology;  Laterality: Right;  CDE: 11.55   CHOLECYSTECTOMY  1975   COLONOSCOPY N/A 08/19/2013   Procedure: COLONOSCOPY;  Surgeon: Claudis PENNER Golda, MD;  Location: [facility];  Service: Endoscopy;  Laterality: N/A;  155-moved to 140 Ann to notify pt   COLONOSCOPY N/A 08/26/2018   Procedure: COLONOSCOPY;  Surgeon: Golda Claudis PENNER, MD;  Location: [facility];  Service: Endoscopy;  Laterality: N/A;   ESOPHAGOGASTRODUODENOSCOPY N/A 08/26/2018   Procedure: ESOPHAGOGASTRODUODENOSCOPY (EGD);  Surgeon: Golda Claudis PENNER, MD;  Location: [facility];  Service: Endoscopy;  Laterality: N/A;   fibromyalgia     LUMBAR LAMINECTOMY/DECOMPRESSION MICRODISCECTOMY Bilateral 09/20/2015   Procedure: MICRO LUMBAR DECOMPRESSION L5-S1 BILATERAL    (1 LEVEL);  Surgeon: Reyes Billing, MD;  Location: [facility];  Service: Orthopedics;  Laterality: Bilateral;   rheumatoid arthritis     Sinus Surgery     TONSILLECTOMY  1968   HPI:  Pt is a 78 y/o female presenting to ED 10/25 due to nausea, vomiting and diarrhea. Admitted for severe sepsis of unknown origin and AKI. 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 PMH: CVA, CAD, depression, HTN, hx of C diff colitis   Assessment / Plan / Recommendation Clinical Impression  Patient presents  with expressive/receptive aphasia characterized by deficits in following two and multistep commands, repetition, and object naming. Clinican conducted Bedside WAB-R assessment to further evaluate language abilities. Patient exhibited greater difficulty with following commands and repetition as complexity increased. Phonemic and semantic paraphasias and perseveration observed. Yes/no questions appears  WFL. Note patient highly anxious upon SLP entry likely impacting attention. Cognition was not fully assessed d/t language deficits. Patient expressive/receptive language functioning is consistent with function present upon discharge from most recent rehabilitation stay. SLP recommends continued speech therapy to target aformentioned deficits.    SLP Assessment  SLP Recommendation/Assessment: Patient needs continued Speech Language Pathology Services SLP Visit Diagnosis: Aphasia (R47.01);Cognitive communication deficit (R41.841)     Assistance Recommended at Discharge  Frequent or constant Supervision/Assistance  Functional Status Assessment Patient has had a recent decline in their functional status and demonstrates the ability to make significant improvements in function in a reasonable and predictable amount of time.  Frequency and Duration min 2x/week  2 weeks      SLP Evaluation Cognition  Overall Cognitive Status: History of cognitive impairments - at baseline Arousal/Alertness: Awake/alert Orientation Level: Oriented X4 Attention: Sustained;Selective Sustained Attention: Impaired Sustained Attention Impairment: Verbal basic Selective Attention: Impaired Selective Attention Impairment: Functional basic;Verbal basic Comments: difficult to fully assess d/t language impairments       Comprehension  Auditory Comprehension Overall Auditory Comprehension: Appears within functional limits for tasks assessed Yes/No Questions: Within Functional Limits Commands: Impaired Two Step Basic Commands: 75-100% accurate Multistep Basic Commands: 50-74% accurate Conversation: Complex Interfering Components: Anxiety;Hearing EffectiveTechniques: Repetition Visual Recognition/Discrimination Discrimination: Within Function Limits Reading Comprehension Reading Status: Not tested    Expression Expression Primary Mode of Expression: Verbal Verbal Expression Overall Verbal Expression:  Impaired Initiation: No impairment Level of Generative/Spontaneous Verbalization: Conversation Repetition: Impaired Level of Impairment: Sentence level;Phrase level Naming: Impairment Responsive: Not tested Confrontation: Impaired Convergent: Not tested Divergent: Not tested Verbal Errors: Semantic paraphasias;Phonemic paraphasias;Perseveration Effective Techniques: Semantic cues;Phonemic cues Written Expression Dominant Hand: Right Written Expression: Not tested   Oral / Motor  Motor Speech Overall Motor Speech: Appears within functional limits for tasks assessed            Christasia Angeletti Kwakye-Ndlovu 05/24/2024, 4:04 PM

## 2024-05-25 ENCOUNTER — Inpatient Hospital Stay (HOSPITAL_COMMUNITY): Payer: Self-pay | Admitting: Anesthesiology

## 2024-05-25 ENCOUNTER — Inpatient Hospital Stay (HOSPITAL_COMMUNITY)

## 2024-05-25 ENCOUNTER — Encounter (HOSPITAL_COMMUNITY)
Admission: EM | Disposition: A | Discharge status: Home Health | Payer: Self-pay | Source: Home / Self Care | Attending: Family Medicine

## 2024-05-25 ENCOUNTER — Encounter (HOSPITAL_COMMUNITY): Payer: Self-pay | Admitting: Internal Medicine

## 2024-05-25 DIAGNOSIS — Q239 Congenital malformation of aortic and mitral valves, unspecified: Secondary | ICD-10-CM

## 2024-05-25 DIAGNOSIS — I639 Cerebral infarction, unspecified: Secondary | ICD-10-CM

## 2024-05-25 DIAGNOSIS — N179 Acute kidney failure, unspecified: Secondary | ICD-10-CM | POA: Insufficient documentation

## 2024-05-25 DIAGNOSIS — I2510 Atherosclerotic heart disease of native coronary artery without angina pectoris: Secondary | ICD-10-CM

## 2024-05-25 DIAGNOSIS — I10 Essential (primary) hypertension: Secondary | ICD-10-CM

## 2024-05-25 DIAGNOSIS — A419 Sepsis, unspecified organism: Secondary | ICD-10-CM | POA: Diagnosis not present

## 2024-05-25 DIAGNOSIS — I340 Nonrheumatic mitral (valve) insufficiency: Secondary | ICD-10-CM

## 2024-05-25 DIAGNOSIS — I6310 Cerebral infarction due to embolism of unspecified precerebral artery: Secondary | ICD-10-CM

## 2024-05-25 DIAGNOSIS — Z87891 Personal history of nicotine dependence: Secondary | ICD-10-CM | POA: Diagnosis not present

## 2024-05-25 HISTORY — PX: TRANSESOPHAGEAL ECHOCARDIOGRAM (CATH LAB): EP1270

## 2024-05-25 HISTORY — DX: Cerebral infarction due to embolism of unspecified precerebral artery: I63.10

## 2024-05-25 LAB — BASIC METABOLIC PANEL WITH GFR
Anion gap: 9 (ref 5–15)
BUN: 6 mg/dL — ABNORMAL LOW (ref 8–23)
CO2: 18 mmol/L — ABNORMAL LOW (ref 22–32)
Calcium: 8.7 mg/dL — ABNORMAL LOW (ref 8.9–10.3)
Chloride: 114 mmol/L — ABNORMAL HIGH (ref 98–111)
Creatinine, Ser: 0.98 mg/dL (ref 0.44–1.00)
GFR, Estimated: 60 mL/min — ABNORMAL LOW (ref >60)
Glucose, Bld: 78 mg/dL (ref 70–99)
Potassium: 3.6 mmol/L (ref 3.5–5.1)
Sodium: 141 mmol/L (ref 135–145)

## 2024-05-25 LAB — MAGNESIUM: Magnesium: 2.2 mg/dL (ref 1.7–2.4)

## 2024-05-25 LAB — ECHO TEE

## 2024-05-25 SURGERY — TRANSESOPHAGEAL ECHOCARDIOGRAM (TEE) (CATHLAB)
Anesthesia: Monitor Anesthesia Care

## 2024-05-25 MED ORDER — LIDOCAINE 2% (20 MG/ML) 5 ML SYRINGE
INTRAMUSCULAR | Status: DC | PRN
  Administered 2024-05-25: 80 mg via INTRAVENOUS

## 2024-05-25 MED ORDER — SODIUM BICARBONATE 650 MG PO TABS
650.0000 mg | ORAL_TABLET | Freq: Three times a day (TID) | ORAL | Status: DC
  Administered 2024-05-25 – 2024-05-26 (×4): 650 mg via ORAL
  Filled 2024-05-25 (×4): qty 1

## 2024-05-25 MED ORDER — SODIUM CHLORIDE 0.9 % IV SOLN: INTRAVENOUS | Status: DC

## 2024-05-25 MED ORDER — PROPOFOL 10 MG/ML IV BOLUS
INTRAVENOUS | Status: DC | PRN
  Administered 2024-05-25: 20 mg via INTRAVENOUS
  Administered 2024-05-25: 80 mg via INTRAVENOUS
  Administered 2024-05-25: 20 mg via INTRAVENOUS
  Administered 2024-05-25 (×4): 30 mg via INTRAVENOUS

## 2024-05-25 NOTE — Progress Notes (Signed)
 SLP Progress Note  SLP reached out to medical team re: potential need for swallow evaluation order due to patient's NPO status 10/27. MD placed order, however during this time, RN reported patient passed [employer] Screen. Additionally, during SLP evaluation for cognitive-linguistic function, swallowing function was informally screened and determined to be [facility], requiring no further assessment. SLP will complete swallow evaluation order.   Naileah Karg, M.A., CCC-SLP

## 2024-05-25 NOTE — Progress Notes (Signed)
 Physical Therapy Treatment Patient Details Name: Betty Golden MRN: [id number] DOB: Oct 23, 1945 Today's Date: 05/25/2024   History of Present Illness Pt is a 78 y/o female presenting to ED 10/25 due to nausea, vomiting and diarrhea. Admitted for severe sepsis of unknown origin and AKI. 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 PMH: CVA, CAD, depression, HTN, hx of C diff colitis    PT Comments  Pt is slowly progressing towards goals. Currently pt is supervision for bed mobility, CGA for sit to stand with RW and CGA for 12 ft of ambulation. Pt most likely will require BSC and W/C for main mobilization due to high fear of falling with RW. Due to pt current functional status, home set up and available assistance at home recommending skilled physical therapy services 3x/week in order to address strength, balance and functional mobility to decrease risk for falls, injury and re-hospitalization.  \   If plan is discharge home, recommend the following: A little help with walking and/or transfers;Assistance with cooking/housework;Assist for transportation;Help with stairs or ramp for entrance;Supervision due to cognitive status     Equipment Recommendations  None recommended by PT       Precautions / Restrictions Precautions Precautions: Fall Recall of Precautions/Restrictions: Intact Restrictions Weight Bearing Restrictions Per Provider Order: No     Mobility  Bed Mobility Overal bed mobility: Needs Assistance Bed Mobility: Supine to Sit, Sit to Supine     Supine to sit: Supervision, HOB elevated Sit to supine: Supervision   General bed mobility comments: able to come to EoB and return with bedrail for assistance    Transfers Overall transfer level: Needs assistance Equipment used: Rolling walker (2 wheels) Transfers: Sit to/from Stand Sit to Stand: Contact guard assist           General transfer comment: contact guard for safety with standing  from bed, pt performed 3x during session    Ambulation/Gait Ambulation/Gait assistance: Contact guard assist Gait Distance (Feet): 12 Feet Assistive device: Rolling walker (2 wheels) Gait Pattern/deviations: Step-through pattern, Decreased stride length Gait velocity: decreased Gait velocity interpretation: <1.31 ft/sec, indicative of household ambulator   General Gait Details: pt self limiting very nervous, hyperventilating; cues for upright posture and deep breathe with pt not following directions most likely due to high fear of falling. Heavy multi modal cues to safety return to sitting EOB      Balance Overall balance assessment: Needs assistance Sitting-balance support: Feet supported, No upper extremity supported Sitting balance-Leahy Scale: Good     Standing balance support: Bilateral upper extremity supported, During functional activity, Reliant on assistive device for balance Standing balance-Leahy Scale: Poor      Communication Communication Communication: Impaired Factors Affecting Communication: Difficulty expressing self;Reduced clarity of speech  Cognition Arousal: Alert Behavior During Therapy: WFL for tasks assessed/performed   PT - Cognitive impairments: History of cognitive impairments, Difficult to assess Difficult to assess due to: Impaired communication       PT - Cognition Comments: noted to have expressive receptive aphasia Following commands: Impaired Following commands impaired: Follows one step commands with increased time, Follows one step commands inconsistently    Cueing Cueing Techniques: Verbal cues, Gestural cues, Tactile cues     General Comments General comments (skin integrity, edema, etc.): pt with hyperventilation during ambulation; grunting, groaning, denying pain; Cues for deep breathes pt respirations did not calm down until pt laid back down. O2 sats and HR WNL      Pertinent Vitals/Pain Pain Assessment  Pain Assessment:  No/denies pain Pain Location: Denies pain Pain Descriptors / Indicators: Moaning, Grimacing Pain Intervention(s): Monitored during session, Limited activity within patient's tolerance     PT Goals (current goals can now be found in the care plan section) Acute Rehab PT Goals Patient Stated Goal: Return Home PT Goal Formulation: Patient unable to participate in goal setting Time For Goal Achievement: 06/07/24 Potential to Achieve Goals: Fair Progress towards PT goals: Progressing toward goals    Frequency    Min 2X/week      PT Plan  Continue with current POC        AM-PAC PT 6 Clicks Mobility   Outcome Measure  Help needed turning from your back to your side while in a flat bed without using bedrails?: A Little Help needed moving from lying on your back to sitting on the side of a flat bed without using bedrails?: A Little Help needed moving to and from a bed to a chair (including a wheelchair)?: A Little Help needed standing up from a chair using your arms (e.g., wheelchair or bedside chair)?: A Little Help needed to walk in hospital room?: A Little Help needed climbing 3-5 steps with a railing? : A Lot 6 Click Score: 17    End of Session Equipment Utilized During Treatment: Gait belt Activity Tolerance: Other (comment) (pt limited by fear of falling) Patient left: in bed;with call bell/phone within reach;with bed alarm set Nurse Communication: Mobility status PT Visit Diagnosis: Difficulty in walking, not elsewhere classified (R26.2);Unsteadiness on feet (R26.81)     Time: 8753-8696 PT Time Calculation (min) (ACUTE ONLY): 17 min  Charges:    $Therapeutic Activity: 8-22 mins PT General Charges $$ ACUTE PT VISIT: 1 Visit                     Dorothyann Maier, DPT, CLT  Acute Rehabilitation Services Office: [phone] (Secure chat preferred)    Dorothyann VEAR Maier 05/25/2024, 1:47 PM

## 2024-05-25 NOTE — Progress Notes (Addendum)
 Speech Language Pathology Treatment: Cognitive-Linguistic  Patient Details Name: Betty Golden MRN: [id number] DOB: 10-Jan-1946 Today's Date: 05/25/2024 Time: 8962-8942 SLP Time Calculation (min) (ACUTE ONLY): 20 min  Assessment / Plan / Recommendation Clinical Impression  Patient seen for skilled speech therapy targeting language goals. Patient appeared less anxious compared to previous session and reported feeling calmer today. SLP facilitated responsive naming task to target expressive and receptive language. Patient supervision to minA for generating appropriate word that matches definition across 29/29 trials. Patient exhibited phonemic paraphasias and occasional perseveration. Patient benefited from visual aids, verbal cues, and increased wait time to produce accurate wording. Plan to continue speech therapy services to target expressive/receptive aphasia.   HPI HPI: Betty Golden is a 78 y/o female presenting to ED 10/25 due to nausea, vomiting and diarrhea. Admitted for severe sepsis of unknown origin and AKI. 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 PMH: CVA, CAD, depression, HTN, hx of C diff colitis      SLP Plan  Continue with current plan of care          Recommendations                         Frequent or constant Supervision/Assistance Aphasia (R47.01);Cognitive communication deficit (R41.841)     Continue with current plan of care     Zyanya Glaza Kwakye-Ndlovu  05/25/2024, 1:08 PM

## 2024-05-25 NOTE — Progress Notes (Signed)
  Echocardiogram Echocardiogram Transesophageal has been performed.  Betty Golden, RDCS 05/25/2024, 2:37 PM

## 2024-05-25 NOTE — Transfer of Care (Signed)
 Immediate Anesthesia Transfer of Care Note  Patient: Betty Golden  Procedure(s) Performed: TRANSESOPHAGEAL ECHOCARDIOGRAM  Patient Location: PACU  Anesthesia Type:General  Level of Consciousness: drowsy  Airway & Oxygen Therapy: Patient Spontanous Breathing and Patient connected to face mask oxygen  Post-op Assessment: Report given to RN and Post -op Vital signs reviewed and stable  Post vital signs: Reviewed and stable  Last Vitals:  Vitals Value Taken Time  BP 101/56   Temp    Pulse 90   Resp 020   SpO2 100     Last Pain:  Vitals:   05/25/24 0927  TempSrc:   PainSc: 0-No pain         Complications: There were no known notable events for this encounter.

## 2024-05-25 NOTE — CV Procedure (Signed)
    TRANSESOPHAGEAL ECHOCARDIOGRAM   NAME:  Betty Golden   MRN: [id number] DOB:  [birth year]   ADMIT DATE: 05/22/2024  INDICATIONS: CVA  PROCEDURE:   Informed consent was obtained prior to the procedure. The risks, benefits and alternatives for the procedure were discussed and the patient comprehended these risks.  Risks include, but are not limited to, cough, sore throat, vomiting, nausea, somnolence, esophageal and stomach trauma or perforation, bleeding, low blood pressure, aspiration, pneumonia, infection, trauma to the teeth and death.    Procedural time out performed.   Patient received monitored anesthesia care under the supervision of Dr. Epifanio. Patient received a total of 240 mg propofol  during the procedure.  The transesophageal probe was inserted in the esophagus and stomach without difficulty and multiple views were obtained.    COMPLICATIONS:    There were no immediate complications.  FINDINGS:  LEFT VENTRICLE: EF = 65-70%. No regional wall motion abnormalities.  RIGHT VENTRICLE: Normal size and function.   LEFT ATRIUM: No thrombus/mass.  LEFT ATRIAL APPENDAGE: No thrombus/mass.   RIGHT ATRIUM: No thrombus/mass.  AORTIC VALVE:  Trileaflet, calcified annulus and mild calcification of leaflets. No regurgitation. No vegetation.  MITRAL VALVE:   Mitral valve is abnormal. The leaflets are thickened and degenerative. There is a small mobile structure on A2 that extends into the atrium, suspect ruptured chordae or degenerative portion of valve. No bacteremia, but in different clinical scenario would be concerning for endocarditis. There is a large focal nodule of calcification on the posterior leaflet, near P2 and the mitral annulus. Below the leaflets, there are calcified and thickened chordae with some mobility on the ventricular aspect of the valve. Mild MR.  TRICUSPID VALVE: Normal structure. Trivial regurgitation. No vegetation.  PULMONIC VALVE: Grossly  normal structure. Trivial regurgitation. No apparent vegetation.  INTERATRIAL SEPTUM: No PFO or ASD seen by color Doppler. Bubble study negative for intracardiac shunt  PERICARDIUM: Trivial effusion noted.  DESCENDING AORTA: Scattered moderate plaque seen   CONCLUSION: Degenerative mitral valve with abnormalities described in report. No clear cardiac source of embolism   Shelda Bruckner, MD, PhD, [facility]  [facility]  [place]  [facility] at [facility] at [facility] [address], [address] [place], [place] [address] [phone]   2:41 PM

## 2024-05-25 NOTE — Discharge Instructions (Signed)
 Betty Golden,  You were in the hospital with concern for infection. Testing did not reveal a definitive source. We were unable to get a stool sample. Thankfully, your symptoms improved quickly. It is quite possible you had a GI bug and improved on your own. While you were here, we also identified a new stroke. During your workup, we could not find a definitive reason for your stroke, but your heart ultrasound did show a valvular abnormality. The neurologist has made recommendations and has changed your medications. Please follow-up with your PCP and neurologist. I will also send you to the heart doctor regarding your heart valve.   Care After Your Loop Recorder  You have a Abbott Loop Recorder   Monitor your cardiac device site for redness, swelling, and drainage. Call the device clinic at [phone] if you experience these symptoms or fever/chills.  If you notice bleeding from your site, hold firm, but gently pressure with two fingers for 5 minutes. Dried blood on the steri-strips when removing the outer bandage is normal.   Keep the large square bandage on your site for 24 hours and then you may remove it yourself. Keep the steri-strips underneath in place.   You may shower after 72 hours / 3 days from your procedure with the steri-strips in place. They will usually fall off on their own, or may be removed after 10 days. Pat dry.   Avoid lotions, ointments, or perfumes over your incision until it is well-healed.  Please do not submerge in water  until your site is completely healed.   Your device is MRI compatible.   Remote monitoring is used to monitor your cardiac device from home. This monitoring is scheduled every month by our office. It allows us  to keep an eye on the function of your device to ensure it is working properly.

## 2024-05-25 NOTE — Progress Notes (Signed)
 PROGRESS NOTE    Betty Golden  FMW:[id number] DOB: 10-06-1945 DOA: 05/22/2024 PCP: Betty Ozell DEL, MD   Brief Narrative: Betty Golden is a 78 y.o. female with a history of hypertension, CAD, stroke, hyperlipidemia, depression, rheumatoid arthritis, GERD.  Patient presented secondary to nausea, vomiting and diarrhea with evidence of sepsis presumed secondary to a GI source. Blood cultures obtained and antibiotics started. GI pathogen panel and C. Difficile panel ordered and are pending. Hospitalization complicated by identification of an incidental stroke seen on MRI.   Assessment and Plan:  Severe sepsis Present on admission and presumed secondary to gastroenteritis. Patient started empirically on Vancomycin  and Cefepime. Blood cultures without growth. Antibiotics discontinued. Patient remains stable and afebrile.   Likely gastroenteritis Patient with initial nausea, vomiting and diarrhea. Symptoms have improved. No abdominal pain. Leukocytosis is improved. GI pathogen panel and C. Difficile testing not obtained. No abdominal pain and no persistent symptoms. Resolved.   Hypokalemia Potassium supplementation given.   AKI Likely secondary to volume loss from diarrhea and vomiting. Creatinine of 1.57 on admission. Resolved with IV fluids.   Acute stroke History of stroke Incidental. Initial CT head (10/25) significant for left parietal hypodensity. MRI brain (10/26) confirms a small acute infarct in the posterior left cerebellum in addition to late subacute posterior MCA territory infarcts correlating to CT findings. CTA head and neck significant for no acute intracranial abnormality and no new large vessel occlusion. LDL of 25. Hemoglobin A1C of 4.7%. Transthoracic Echocardiogram not obtained this admission secondary to prior Transthoracic Echocardiogram on 10/10. Transesophageal Echocardiogram significant for no evidence of an intracardiac source of stroke and no PFO.  Neurology recommendations for aspirin  x3 months, Plavix  indefinitely and loop recorder placement prior to discharge. PT, OT, SLP recommendations for home health therapy. Continue aspirin  and Plavix . Continue Crestor . -Continue Aspirin  and Plavix  -Continue Crestor  -Follow-up loop recorder placement (Neuro to call and arrange in AM)  Degenerative mitral valve Noted on Transesophageal Echocardiogram. Patient has a cardiology appointment already scheduled in November.   Hypertensive urgency Primary hypertension Continue amlodipine  and metoprolol .   Hyperlipidemia Continue Crestor .   GERD Continue Protonix .   Depression Continue Cymbalta  and trazodone .   Rheumatoid arthritis Noted.    DVT prophylaxis: Lovenox  Code Status:   Code Status: Full Code Family Communication: None at bedside Disposition Plan: Discharge home likely on 10/29 pending ongoing neurology recommendations and loop recorder placement   Consultants:  Neurology Cardiology  Procedures:  None  Antimicrobials: None    Subjective: No issues this morning, per patient.  Objective: BP (!) 141/77 (BP Location: Left Arm)   Pulse (!) 101   Temp 98.2 F (36.8 C) (Oral)   Resp 18   Ht 5' 3 (1.6 m)   Wt 68.9 kg   SpO2 97%   BMI 26.93 kg/m   Examination:  General exam: Appears less anxious today. Comfortable lying in bed.Betty Golden Respiratory system: Clear to auscultation. Respiratory effort normal. Cardiovascular system: S1 & S2 heard, RRR. No murmur. Gastrointestinal system: Abdomen is nondistended, soft and nontender. Normal bowel sounds heard. Central nervous system: Alert and oriented.   Data Reviewed: I have personally reviewed following labs and imaging studies  CBC Lab Results  Component Value Date   WBC 11.3 (H) 05/24/2024   RBC 3.67 (L) 05/24/2024   HGB 10.5 (L) 05/24/2024   HCT 32.2 (L) 05/24/2024   MCV 87.7 05/24/2024   MCH 28.6 05/24/2024   PLT 151 05/24/2024   MCHC 32.6 05/24/2024  RDW 14.3 05/24/2024   LYMPHSABS 4.7 (H) 05/22/2024   MONOABS 1.0 05/22/2024   EOSABS 0.0 05/22/2024   BASOSABS 0.1 05/22/2024     Last metabolic panel Lab Results  Component Value Date   NA 141 05/25/2024   K 3.6 05/25/2024   CL 114 (H) 05/25/2024   CO2 18 (L) 05/25/2024   BUN 6 (L) 05/25/2024   CREATININE 0.98 05/25/2024   GLUCOSE 78 05/25/2024   GFRNONAA 60 (L) 05/25/2024   GFRAA 54 (L) 04/21/2019   CALCIUM  8.7 (L) 05/25/2024   PHOS 2.6 09/27/2021   PROT 6.2 (L) 05/23/2024   ALBUMIN 3.3 (L) 05/23/2024   LABGLOB 3.6 09/08/2017   BILITOT 0.9 05/23/2024   ALKPHOS 58 05/23/2024   AST 26 05/23/2024   ALT 17 05/23/2024   ANIONGAP 9 05/25/2024    GFR: Estimated Creatinine Clearance: 45.5 mL/min (by C-G formula based on SCr of 0.98 mg/dL).  Recent Results (from the past 240 hours)  Culture, blood (Routine x 2)     Status: None (Preliminary result)   Collection Time: 05/22/24  4:26 PM   Specimen: BLOOD RIGHT ARM  Result Value Ref Range Status   Specimen Description BLOOD RIGHT ARM  Final   Special Requests   Final    BOTTLES DRAWN AEROBIC AND ANAEROBIC Blood Culture adequate volume   Culture   Final    NO GROWTH 2 DAYS Performed at [facility], [address] [place], [place] [address]    Report Status PENDING  Incomplete  Culture, blood (Routine x 2)     Status: None (Preliminary result)   Collection Time: 05/22/24  4:47 PM   Specimen: BLOOD LEFT ARM  Result Value Ref Range Status   Specimen Description BLOOD LEFT ARM  Final   Special Requests   Final    AEROBIC BOTTLE ONLY Blood Culture results may not be optimal due to an inadequate volume of blood received in culture bottles   Culture   Final    NO GROWTH 2 DAYS Performed at [facility], [address] [place], [place] [address]    Report Status PENDING  Incomplete      Radiology Studies: VAS US  LOWER EXTREMITY VENOUS (DVT) Result Date: 05/24/2024  Lower Venous DVT Study Patient Name:   Betty Golden  Date of Exam:   05/24/2024 Medical Rec #: [id number]          Accession #:    [id number] Date of Birth: 1945-08-16          Patient Gender: F Patient Age:   47 years Exam Location:  [facility] Procedure:      VAS US  LOWER EXTREMITY VENOUS (DVT) Referring Phys: ROCKY LIKES --------------------------------------------------------------------------------  Indications: Injury to blood vessels, unspecified site T14.90.  Risk Factors: None identified. Limitations: Poor ultrasound/tissue interface. Comparison Study: No prior studies. Performing Technologist: Cordella Collet RVT  Examination Guidelines: A complete evaluation includes B-mode imaging, spectral Doppler, color Doppler, and power Doppler as needed of all accessible portions of each vessel. Bilateral testing is considered an integral part of a complete examination. Limited examinations for reoccurring indications may be performed as noted. The reflux portion of the exam is performed with the patient in reverse Trendelenburg.  +---------+---------------+---------+-----------+----------+--------------+ RIGHT    CompressibilityPhasicitySpontaneityPropertiesThrombus Aging +---------+---------------+---------+-----------+----------+--------------+ CFV      Full           Yes      Yes                                 +---------+---------------+---------+-----------+----------+--------------+  SFJ      Full                                                        +---------+---------------+---------+-----------+----------+--------------+ FV Prox  Full                                                        +---------+---------------+---------+-----------+----------+--------------+ FV Mid   Full                                                        +---------+---------------+---------+-----------+----------+--------------+ FV DistalFull                                                         +---------+---------------+---------+-----------+----------+--------------+ PFV      Full                                                        +---------+---------------+---------+-----------+----------+--------------+ POP      Full           Yes      Yes                                 +---------+---------------+---------+-----------+----------+--------------+ PTV      Full                                                        +---------+---------------+---------+-----------+----------+--------------+ PERO     Full                                                        +---------+---------------+---------+-----------+----------+--------------+   +---------+---------------+---------+-----------+----------+--------------+ LEFT     CompressibilityPhasicitySpontaneityPropertiesThrombus Aging +---------+---------------+---------+-----------+----------+--------------+ CFV      Full           Yes      Yes                                 +---------+---------------+---------+-----------+----------+--------------+ SFJ      Full                                                        +---------+---------------+---------+-----------+----------+--------------+  FV Prox  Full                                                        +---------+---------------+---------+-----------+----------+--------------+ FV Mid   Full                                                        +---------+---------------+---------+-----------+----------+--------------+ FV DistalFull                                                        +---------+---------------+---------+-----------+----------+--------------+ PFV      Full                                                        +---------+---------------+---------+-----------+----------+--------------+ POP      Full           Yes      Yes                                  +---------+---------------+---------+-----------+----------+--------------+ PTV      Full                                                        +---------+---------------+---------+-----------+----------+--------------+ PERO     Full                                                        +---------+---------------+---------+-----------+----------+--------------+     Summary: RIGHT: - There is no evidence of deep vein thrombosis in the lower extremity.  - No cystic structure found in the popliteal fossa.  LEFT: - There is no evidence of deep vein thrombosis in the lower extremity.  - No cystic structure found in the popliteal fossa.  *See table(s) above for measurements and observations. Electronically signed by Debby Robertson on 05/24/2024 at 1:19:10 PM.    Final    CT ANGIO HEAD NECK W WO CM Result Date: 05/24/2024 EXAM: CTA Head and Neck with Intravenous Contrast. CT Head without Contrast. CLINICAL HISTORY: Stroke/TIA, determine embolic source. TECHNIQUE: Axial CTA images of the head and neck performed with intravenous contrast. MIP reconstructed images were created and reviewed. Axial computed tomography images of the head/brain performed without intravenous contrast. Note: Per PQRS, the description of internal carotid artery percent stenosis, including 0 percent or normal exam, is based on North American Symptomatic Carotid Endarterectomy Trial (NASCET) criteria. Dose reduction technique was used including one or more  of the following: automated exposure control, adjustment of mA and kV according to patient size, and/or iterative reconstruction. CONTRAST: Without and with; 75mL (iohexol  (OMNIPAQUE ) 350 MG/ML injection 75 mL IOHEXOL  350 MG/ML SOLN) COMPARISON: MRI head from earlier today. CTA head and neck 04/13/2024 FINDINGS: BRAIN: No acute intraparenchymal hemorrhage. No mass lesion. 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 No  midline shift or extra-axial collection. VENTRICLES: No hydrocephalus. ORBITS: The orbits are unremarkable. SINUSES AND MASTOIDS: The paranasal sinuses and mastoid air cells are clear. COMMON CAROTID ARTERIES: No significant stenosis. No dissection or occlusion. INTERNAL CAROTID ARTERIES: No stenosis by NASCET criteria. No dissection or occlusion. VERTEBRAL ARTERIES: No significant stenosis. No dissection or occlusion. ANTERIOR CEREBRAL ARTERIES: No significant stenosis. No occlusion. No aneurysm. MIDDLE CEREBRAL ARTERIES: Similar proximal left M2 MCA occlusion. No new large vessel occlusion. Right MCA is patent. POSTERIOR CEREBRAL ARTERIES: No significant stenosis. No occlusion. No aneurysm. BASILAR ARTERY: No significant stenosis. No occlusion. No aneurysm. LUNG APICES: Unchanged opacities in the peripheral right upper lobe, which may reflect scarring. BONES: No acute osseous abnormality. Bulky bridging osteophytes in the cervical spine. IMPRESSION: 1. No evidence of acute intracranial abnormality. 2. Similar proximal left M2 MCA occlusion. No new large vessel occlusion. Electronically signed by: Gilmore Molt MD 05/24/2024 01:11 AM EDT RP Workstation: [id number]      LOS: 3 days    Elgin Lam, MD [facility] 05/25/2024, 7:50 AM   If 7PM-7AM, please contact night-coverage [email or web address]

## 2024-05-25 NOTE — Anesthesia Postprocedure Evaluation (Signed)
 Anesthesia Post Note  Patient: Betty Golden  Procedure(s) Performed: TRANSESOPHAGEAL ECHOCARDIOGRAM     Patient location during evaluation: Cath Lab Anesthesia Type: MAC Level of consciousness: awake and alert Pain management: pain level controlled Vital Signs Assessment: post-procedure vital signs reviewed and stable Respiratory status: spontaneous breathing, nonlabored ventilation and respiratory function stable Cardiovascular status: stable and blood pressure returned to baseline Postop Assessment: no apparent nausea or vomiting Anesthetic complications: no   There were no known notable events for this encounter.  Last Vitals:  Vitals:   05/25/24 1500 05/25/24 1518  BP: (!) 141/72 (!) 140/67  Pulse: 97 95  Resp: 20   Temp:  36.4 C  SpO2: 99% 99%    Last Pain:  Vitals:   05/25/24 1500  TempSrc:   PainSc: 0-No pain                 Valeta Paz,W. EDMOND

## 2024-05-25 NOTE — Interval H&P Note (Signed)
 History and Physical Interval Note:  05/25/2024 2:01 PM  Betty Golden  has presented today for surgery, with the diagnosis of stroke.  The various methods of treatment have been discussed with the patient and family. After consideration of risks, benefits and other options for treatment, the patient has consented to  Procedure(s): TRANSESOPHAGEAL ECHOCARDIOGRAM (N/A) as a surgical intervention.  The patient's history has been reviewed, patient examined, no change in status, stable for surgery.  I have reviewed the patient's chart and labs.  Questions were answered to the patient's satisfaction.     Bernarda Erck Lonni

## 2024-05-25 NOTE — Progress Notes (Signed)
 STROKE TEAM PROGRESS NOTE    SIGNIFICANT HOSPITAL EVENTS MRI of the brain showed incidental small acute infarct in the posterior left cerebellum.   INTERIM HISTORY/SUBJECTIVE No family at the bedside. Patient is confused with poor attention, can follow simple commands.  TEE scheduled for tomorrow   CBC    Component Value Date/Time   WBC 11.3 (H) 05/24/2024 0436   RBC 3.67 (L) 05/24/2024 0436   HGB 10.5 (L) 05/24/2024 0436   HGB 12.8 11/14/2023 1357   HCT 32.2 (L) 05/24/2024 0436   PLT 151 05/24/2024 0436   PLT 250 11/14/2023 1357   MCV 87.7 05/24/2024 0436   MCH 28.6 05/24/2024 0436   MCHC 32.6 05/24/2024 0436   RDW 14.3 05/24/2024 0436   LYMPHSABS 4.7 (H) 05/22/2024 1648   MONOABS 1.0 05/22/2024 1648   EOSABS 0.0 05/22/2024 1648   BASOSABS 0.1 05/22/2024 1648    BMET    Component Value Date/Time   NA 141 05/25/2024 0431   NA 143 09/08/2017 0942   K 3.6 05/25/2024 0431   CL 114 (H) 05/25/2024 0431   CO2 18 (L) 05/25/2024 0431   GLUCOSE 78 05/25/2024 0431   BUN 6 (L) 05/25/2024 0431   BUN 18 09/08/2017 0942   CREATININE 0.98 05/25/2024 0431   CREATININE 1.20 (H) 11/14/2023 1357   CALCIUM  8.7 (L) 05/25/2024 0431   GFRNONAA 60 (L) 05/25/2024 0431   GFRNONAA 47 (L) 11/14/2023 1357    IMAGING past 24 hours No results found.   Vitals:   05/25/24 0000 05/25/24 0422 05/25/24 0825 05/25/24 1143  BP: 116/61 (!) 141/77 136/67 (!) 141/77  Pulse: 94 (!) 101 (!) 104 (!) 106  Resp: 18 18    Temp: 98.6 F (37 C) 98.2 F (36.8 C) 98 F (36.7 C) 97.8 F (36.6 C)  TempSrc: Oral Oral Oral   SpO2: 98% 97% 98% 98%  Weight:      Height:         PHYSICAL EXAM General:  Alert, well-nourished, well-developed patient in no acute distress Psych:  Mood and affect appropriate for situation CV: Regular rate and rhythm on monitor Respiratory:  Regular, unlabored respirations on room air GI: Abdomen soft and nontender   NEURO:  awake, alert, eyes open, mild stuttering  speech present. She did have some word finding difficulty and perseveration with paraphasic errors. Orientated to place, told me age 77 instead of 36, told me month August instead of October and year 2022 instead of 2025. Mildly dysarthria. Following all simple commands. Able to name 3/4 and repeat simple sentences. No gaze palsy, tracking bilaterally, visual field full, PERRL. No facial droop. Tongue midline. Bilateral UEs 5/5, no drift. Bilaterally LEs 5/5, no drift. Sensation symmetrical bilaterally, b/l FTN intact, gait not tested.    ASSESSMENT/PLAN  [name] is a 78 y.o. 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  MRI of the brain showed incidental small acute infarct in the posterior left cerebellum.     Stroke:  left punctate cerebellum infarct, late subacute left parietal infarct, etiology:  likely cardioembolic   CT head No acute abnormality, subacute left parietal infarct.  CTA head & neck similar proximal left M2 MCA occlusion. No new large vessel occlusion  MRI  Small acute infarct in the posterior left cerebellum. And Late subacute posterior MCA territory infarcts, left >  right LE venous US  negative  2D Echo 10/10: EF 70-75% abnormal motion on the ventricular aspect of the valve, near the severe MAC  TEE pending today will consider loop recorder if workup negative LDL 25 HgbA1c 4.7 VTE prophylaxis - lovenox   clopidogrel  75 mg daily prior to admission, now on aspirin  81 mg daily and clopidogrel  75 mg daily for 3 months and then plavix  alone. Therapy recommendations: HH PT Disposition:  pending   Hx of Stroke/TIA 03/2024 admitted for altered mental status.  CT showed chronic right parietal infarct but questionable left acute parietal infarct.  CT head and neck showed left M2 occlusion with distal reconstitution.  LDL 98, A1c 4.7.   Discharged on DAPT for 3 months and recommended 30-day CardioNet monitoring.  Hypertension Home meds:  amlodipine  10mg , metoprolol  50mg   Stable On home Norvasc  10, metoprolol  50 Long-term BP goal normotensive  Hyperlipidemia Home meds:  crestor  20mg ,  resumed in hospital LDL 25, goal < 70 Continue statin at discharge  Other Stroke Risk Factors Advanced age Coronary artery disease Congestive heart failure  Other Active Problems RA  GERD Depression  Fibromyalgia   Hospital day # 3   Ary Cummins, MD PhD Stroke Neurology 05/25/2024 2:18 PM    To contact Stroke Continuity provider, please refer to [email or web address]. After hours, contact General Neurology

## 2024-05-25 NOTE — Plan of Care (Signed)

## 2024-05-26 ENCOUNTER — Ambulatory Visit: Admitting: Allergy & Immunology

## 2024-05-26 ENCOUNTER — Encounter (HOSPITAL_COMMUNITY)
Admission: EM | Disposition: A | Discharge status: Home Health | Payer: Self-pay | Source: Home / Self Care | Attending: Family Medicine

## 2024-05-26 DIAGNOSIS — I6310 Cerebral infarction due to embolism of unspecified precerebral artery: Secondary | ICD-10-CM | POA: Diagnosis not present

## 2024-05-26 DIAGNOSIS — I639 Cerebral infarction, unspecified: Secondary | ICD-10-CM | POA: Diagnosis not present

## 2024-05-26 HISTORY — PX: LOOP RECORDER INSERTION: EP1214

## 2024-05-26 SURGERY — LOOP RECORDER INSERTION

## 2024-05-26 MED ORDER — ASPIRIN 81 MG PO TBEC: 81.0000 mg | DELAYED_RELEASE_TABLET | Freq: Every day | ORAL | 12 refills | Status: AC

## 2024-05-26 MED ORDER — LIDOCAINE-EPINEPHRINE 1 %-1:100000 IJ SOLN
INTRAMUSCULAR | Status: AC
  Filled 2024-05-26: qty 1

## 2024-05-26 MED ORDER — LIDOCAINE-EPINEPHRINE 1 %-1:100000 IJ SOLN
INTRAMUSCULAR | Status: DC | PRN
  Administered 2024-05-26: 20 mL

## 2024-05-26 SURGICAL SUPPLY — 2 items
MONITOR CARDIAC ASSERT IQ EL (Prosthesis & Implant Heart) IMPLANT
PACK LOOP INSERTION (CUSTOM PROCEDURE TRAY) ×1 IMPLANT

## 2024-05-26 NOTE — Consult Note (Addendum)
 ELECTROPHYSIOLOGY CONSULT NOTE  Patient ID: Betty Golden MRN: [id number], DOB/AGE: 04-Nov-1945   Admit date: 05/22/2024 Date of Consult: 05/26/2024  Primary Physician: Irven Ozell DEL, MD Primary Cardiologist: None  Primary Electrophysiologist: New to Dr. Kennyth  Reason for Consultation: Cryptogenic stroke; recommendations regarding Implantable Loop Recorder Insurance: [employer]  History of Present Illness EP has been asked to evaluate Betty Golden for placement of an implantable loop recorder to monitor for atrial fibrillation by Dr Jerri.  The patient was admitted on 05/22/2024 with residual mild leg weakness, aphasia, and dysarthria.    The pt has undergone workup for stroke including:  Stroke:  left punctate cerebellum infarct, late subacute left parietal infarct, etiology:  likely cardioembolic   CT head No acute abnormality, subacute left parietal infarct.  CTA head & neck similar proximal left M2 MCA occlusion. No new large vessel occlusion  MRI  Small acute infarct in the posterior left cerebellum. And Late subacute posterior MCA territory infarcts, left > right LE venous US  negative  2D Echo 10/10: EF 70-75% abnormal motion on the ventricular aspect of the valve, near the severe MAC  TEE 10/28 without thrombus LDL 25 HgbA1c 4.7 VTE prophylaxis - lovenox   clopidogrel  75 mg daily prior to admission, now on aspirin  81 mg daily and clopidogrel  75 mg daily for 3 months and then plavix  alone. Therapy recommendations: HH PT   The patient has been monitored on telemetry which has demonstrated sinus rhythm with no arrhythmias.   Echocardiogram and TEE as above. Lab work is reviewed.  Prior to admission, the patient denies chest pain, shortness of breath, dizziness, palpitations, or syncope.  She is recovering from her stroke with plans to return home  at discharge.  Allergies, Past Medical, Surgical, Social, and Family Histories have been reviewed and are referenced  here-in when relevant for medical decision making.   Inpatient Medications:   amLODipine   10 mg Oral Daily   aspirin  EC  81 mg Oral Daily   clopidogrel   75 mg Oral Daily   DULoxetine   30 mg Oral BID   enoxaparin  (LOVENOX ) injection  40 mg Subcutaneous Q24H   leflunomide   20 mg Oral Daily   metoprolol  succinate  50 mg Oral Daily   pantoprazole   40 mg Oral Daily   rosuvastatin   20 mg Oral Daily   sodium bicarbonate   650 mg Oral TID   traZODone   25 mg Oral QHS    Physical Exam: Vitals:   05/25/24 2008 05/26/24 0039 05/26/24 0504 05/26/24 0748  BP: (!) 149/65 (!) 143/57 (!) 140/46 (!) 158/82  Pulse: (!) 102 89 95 99  Resp:   18   Temp: 98.2 F (36.8 C) 98.3 F (36.8 C) 98.2 F (36.8 C) 98.3 F (36.8 C)  TempSrc:   Oral   SpO2: 99% 96% 98% 98%  Weight:      Height:        GEN- NAD. A&O x 3. Normal affect. HEENT: Normocephalic, atraumatic Lungs- CTAB, Normal effort.  Heart- Regular rate and rhythm rate and rhythm. No M/G/R.  Extremities- No peripheral edema. no clubbing or cyanosis Skin- warm and dry, no rash or lesion. Neuro - No focal deficit   12-lead ECG on arrival shows sinus tach at 123 bpm (personally reviewed) All prior EKG's in EPIC reviewed with no documented atrial fibrillation  Telemetry NSR/ST 70-100s (personally reviewed)  Assessment and Plan:  Cryptogenic stroke The patient presents with cryptogenic stroke.  I spoke at length with the  patient about monitoring for afib with an implantable loop recorder, including monthly monitor fees which may range from $0-$40.  Risks, benefits, and alteratives to implantable loop recorder were discussed with the patient today.  At this time, the patient is very clear in their decision to proceed with implantable loop recorder.   Wound care was reviewed with the patient (keep incision clean and dry for 3 days). Please call with questions.   GI illness Resolved  Ozell Prentice Passey, PA-C 05/26/2024 9:38 AM  I  have seen, examined the patient, and reviewed the above assessment and plan.    HPI: Betty Golden is a 78 year old female with past medical history notable for CVA in 03/2024, chronic CHF, rheumatoid arthritis, fibromyalgia, essential hypertension, COPD who presented to the ED on 10/25 with nausea and vomiting and diarrhea for about 1 week.  Patient had exhibited mild right lower extremity weakness, aphasia and dysarthria.  Cardiac MRI was performed which showed small acute infarct in the posterior left cerebellum.  Aside from her GI illness, patient reports feeling at baseline.  She has no new or acute complaints today.  General: Well developed, in no acute distress.  Neck: No JVD.  Cardiac: Normal rate, regular rhythm.  Resp: Normal work of breathing.  Ext: No edema.  Psych: Normal affect.   Problem List:  Cryptogentic stroke  Assessment and Plan:  Betty Golden is a 78 year old female with past medical history notable for CVA in 03/2024, chronic CHF, rheumatoid arthritis, fibromyalgia, essential hypertension, COPD who was admitted with cryptogenic stroke.  EP has been consulted for loop recorder implant.  Discussed role of atrial fibrillation as possible etiology for stroke.  Discussed rationale of using a loop recorder for prolonged monitoring for atrial fibrillation.  Discussed risk and benefits of loop recorder implant.  Patient voiced understanding would like to proceed.  Will arrange for loop recorder implant prior to discharge.  Fonda Kitty, MD 05/26/2024 10:01 PM

## 2024-05-26 NOTE — Progress Notes (Signed)
 Mobility Specialist: Progress Note   05/26/24 1600  Mobility  Activity Ambulated with assistance  Level of Assistance Contact guard assist, steadying assist  Assistive Device Front wheel walker  Distance Ambulated (ft) 25 ft  Activity Response Tolerated well  Mobility Referral Yes  Mobility visit 1 Mobility  Mobility Specialist Start Time (ACUTE ONLY) 1026  Mobility Specialist Stop Time (ACUTE ONLY) 1038  Mobility Specialist Time Calculation (min) (ACUTE ONLY) 12 min    During Mobility (Sitting EOB): HR 103, SpO2 99% RA During Mobility (Ambulation): HR 113, SpO2 99% RA  Pt received in bed, pleasant and agreeable to mobility session. C/o feeling very weak and fatigued. SV for bed mobility and STS. MinG for ambulation for safety d/t fatigue. Required min cues for RW proximity. Audibly SOB, SpO2 99% on RA, HR 113. Returned to bed. Very fatigued after ambulation. Left in bed with all needs met, call bell in reach.   Ileana Lute Mobility Specialist Please contact via SecureChat or Rehab office at [phone]

## 2024-05-26 NOTE — Discharge Summary (Signed)
 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 *  Hospital Course: Betty Golden is a 78 y.o. female with a history of hypertension, CAD, stroke, hyperlipidemia, depression, rheumatoid arthritis, GERD.  Patient presented secondary to nausea, vomiting and diarrhea with evidence of sepsis presumed secondary to a GI source. Blood cultures obtained and antibiotics started. GI pathogen panel and C. Difficile panel ordered and are pending. Hospitalization complicated by identification of an incidental stroke seen on MRI. She does have history of prior CVA with residual mild right leg weakness, aphasia and dysarthria,  MRI of the brain showed incidental small acute infarct in the posterior left cerebellum.    Assessment and Plan:    Stroke:  left punctate cerebellum infarct, late subacute left parietal infarct, etiology:  likely cardioembolic   CT head No acute abnormality, subacute left parietal infarct.  CTA head & neck similar proximal left M2 MCA occlusion. No new large vessel occlusion  MRI  Small acute infarct in the posterior left cerebellum. And Late subacute posterior MCA territory infarcts, left > right LE venous US  negative  2D Echo 10/10: EF 70-75% abnormal motion on the ventricular aspect of the valve, near the severe MAC  TEE 10/28 - Degenerative mitral valve with abnormalities described in report.  No clear cardiac source of embolism Loop placement on 10/29 LDL 25 HgbA1c 4.7 VTE prophylaxis - lovenox   clopidogrel  75 mg daily prior to admission, now on aspirin  81 mg daily and clopidogrel  75 mg daily for 3 months and then plavix  alone. Therapy recommendations: HH PT Disposition:  Home    Hx of Stroke/TIA 03/2024 admitted for altered mental status.  CT showed chronic right parietal infarct but questionable left acute parietal infarct.  CT head and neck showed left M2 occlusion with distal reconstitution.  LDL 98, A1c 4.7.  Discharged on DAPT for 3 months and recommended 30-day CardioNet monitoring.   Hypertension Home meds:  amlodipine  10mg , metoprolol  50mg   Stable On home Norvasc  10, metoprolol  50 Long-term BP goal normotensive   Hyperlipidemia Home meds:  crestor  20mg ,  resumed in hospital LDL 25, goal < 70 Continue statin at discharge   Other Stroke Risk Factors Advanced age Coronary artery disease Congestive heart failure   Other Active Problems RA  GERD Depression  Fibromyalgia         Consultants: Neurology EP Procedures performed: Loop recorder placement  Disposition: Home Diet recommendation:  Cardiac diet DISCHARGE MEDICATION: Allergies as of 05/26/2024       Reactions   Hydromorphone  Anaphylaxis   Sulfa Antibiotics Rash        Medication List     TAKE these medications    acetaminophen  325 MG tablet Commonly known as: TYLENOL  Take 1-2 tablets (325-650 mg total) by mouth every 4 (four) hours as needed for mild pain (pain score 1-3). What changed:  how much to take when to take this reasons  to take this   albuterol  108 (90 Base) MCG/ACT inhaler Commonly known as: ProAir  HFA Inhale 2 puffs into the lungs every 4 (four) hours as needed for wheezing or shortness of breath.   amLODipine  10 MG tablet Commonly known as: NORVASC  Take 1 tablet (10 mg total) by mouth daily.   aspirin  EC 81 MG tablet Take 1 tablet (81 mg total) by mouth  daily. Swallow whole. Start taking on: May 27, 2024   CIMZIA Franquez Inject 1 Dose into the skin every 14 (fourteen) days.   clindamycin 1 % lotion Commonly known as: CLEOCIN T Apply 1 Application topically 2 (two) times daily.   clopidogrel  75 MG tablet Commonly known as: PLAVIX  Take 1 tablet (75 mg total) by mouth daily.   DULoxetine  30 MG capsule Commonly known as: CYMBALTA  Take 1 capsule (30 mg total) by mouth 2 (two) times daily.   Fasenra  30 MG/ML prefilled syringe Generic drug: benralizumab  Inject 1 mL (30 mg total) into the skin every 8 (eight) weeks.   ferrous sulfate 325 (65 FE) MG EC tablet Take 325 mg by mouth daily.   leflunomide  20 MG tablet Commonly known as: ARAVA  Take 20 mg by mouth daily.   melatonin 5 MG Tabs Take 1 tablet (5 mg total) by mouth at bedtime.   metoprolol  succinate 50 MG 24 hr tablet Commonly known as: Toprol  XL Take 1 tablet (50 mg total) by mouth daily.   pantoprazole  40 MG tablet Commonly known as: PROTONIX  Take 1 tablet (40 mg total) by mouth daily.   potassium chloride  10 MEQ tablet Commonly known as: KLOR-CON  M Take 1 tablet (10 mEq total) by mouth daily.   pregabalin  25 MG capsule Commonly known as: LYRICA  Take 25 mg by mouth 2 (two) times daily.   rosuvastatin  20 MG tablet Commonly known as: CRESTOR  Take 1 tablet (20 mg total) by mouth daily.   saccharomyces boulardii 250 MG capsule Commonly known as: FLORASTOR Take 1 capsule (250 mg total) by mouth 2 (two) times daily.   traZODone  50 MG tablet Commonly known as: DESYREL  Take 0.5 tablets (25 mg total) by mouth at bedtime.   Trelegy Ellipta  200-62.5-25 MCG/ACT Aepb Generic drug: Fluticasone -Umeclidin-Vilant Inhale 1 puff into the lungs in the morning. What changed: how much to take        Follow-up Information     [facility] Follow up.   Specialty: Home Health Services Why: [facility] will continue to provide home health  services. Contact information: [address] [place] [address] [phone]                Discharge Exam: Betty Golden   05/22/24 2224  Weight: 68.9 kg  General exam: NAD Respiratory system: Clear to auscultation. Respiratory effort normal. Cardiovascular system: S1 & S2 heard, RRR. No murmur. Gastrointestinal system: Abdomen is nondistended, soft and nontender. Normal bowel sounds heard. Central nervous system: Alert and oriented. No focal neurological deficits. Psychiatry: Judgement and insight appear normal. Mood & affect appropriate.    Condition at discharge: stable  The results of significant diagnostics from this hospitalization (including imaging, microbiology, ancillary and laboratory) are listed below for reference.   Imaging Studies: ECHO TEE Result Date: 05/25/2024    TRANSESOPHOGEAL ECHO REPORT   Patient Name:   Betty Golden Date of Exam: 05/25/2024 Medical Rec #:  [id number]         Height:       63.0 in Accession #:  [id number]        Weight:       152.0 lb Date of Birth:  [birth year]         BSA:          1.721 m Patient Age:    78 years          BP:           144/79 mmHg Patient Gender: F                 HR:           87 bpm. Exam Location:  Inpatient Procedure: Transesophageal Echo, Cardiac Doppler, Limited Color Doppler, Saline            Contrast Bubble Study and 3D Echo (Both Spectral and Color Flow            Doppler were utilized during procedure). Indications:     Endocarditis  History:         Patient has prior history of Echocardiogram examinations, most                  recent 05/07/2024. COPD and hx of CVA, Severe Mitral Annular                  Calcification; Risk Factors:Hypertension, Dyslipidemia and                  Former Smoker.  Sonographer:     Koleen Popper RDCS Referring Phys:  [id number] ROLLO JONELLE LOUDER Diagnosing Phys: Shelda Bruckner MD  Sonographer Comments: Image acquisition challenging due to respiratory  motion. PROCEDURE: After discussion of the risks and benefits of a TEE, an informed consent was obtained from the patient. The transesophogeal probe was passed without difficulty through the esophogus of the patient. Imaged were obtained with the patient in a left lateral decubitus position. Sedation performed by different physician. The patient was monitored while under deep sedation. Anesthestetic sedation was provided intravenously by Anesthesiology: 240mg  of Propofol , 80mg  of Lidocaine . Image quality was adequate. The patient's vital signs; including heart rate, blood pressure, and oxygen saturation; remained stable throughout the procedure. The patient developed no complications during the procedure.  IMPRESSIONS  1. Left ventricular ejection fraction, by estimation, is 65 to 70%. The left ventricle has normal function.  2. Right ventricular systolic function is normal. The right ventricular size is normal.  3. No left atrial/left atrial appendage thrombus was detected. The LAA emptying velocity was 49 cm/s.  4. There is a small mobile structure on A2 that extends into the atrium, suspect ruptured chordae or degenerative portion of valve. No bacteremia, but in different clinical scenario would be concerning for endocarditis. There is a large focal nodule of calcification on the posterior leaflet, near P2 and the mitral annulus. Below the leaflets, there are calcified and thickened chordae with some mobility on the ventricular aspect of the valve. The mitral valve is degenerative. Mild mitral valve regurgitation. No evidence of mitral stenosis. Severe mitral annular calcification.  5. The aortic valve is tricuspid. There is mild calcification of the aortic valve. There is mild thickening of the aortic valve. Aortic valve regurgitation is not visualized. Aortic valve sclerosis/calcification is present, without any evidence of aortic stenosis.  6. There is Moderate (Grade III) plaque involving the descending  aorta.  7. Agitated saline contrast bubble study was negative, with no evidence of any interatrial shunt.  8. 3D performed of the mitral valve and demonstrates Focal calcified nodule  on P2/annulus, without mobility. Small mobile structure on A2. Small focal calcified/mobile structures in subvalvular apparatus. 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 No clear cardiac source of embolism. FINDINGS  Left Ventricle: Left ventricular ejection fraction, by estimation, is 65 to 70%. The left ventricle has normal function. The left ventricular internal cavity size was normal in size. Right Ventricle: The right ventricular size is normal. No increase in right ventricular wall thickness. Right ventricular systolic function is normal. Left Atrium: Left atrial size was normal in size. No left atrial/left atrial appendage thrombus was detected. The LAA emptying velocity was 49 cm/s. Right Atrium: Right atrial size was normal in size. Prominent Eustachian valve. Pericardium: Trivial pericardial effusion is present. Mitral Valve: There is a small mobile structure on A2 that extends into the atrium, suspect ruptured chordae or degenerative portion of valve. No bacteremia, but in different clinical scenario would be concerning for endocarditis. There is a large focal nodule of calcification on the posterior leaflet, near P2 and the mitral annulus. Below the leaflets, there are calcified and thickened chordae with some mobility on the ventricular aspect of the valve. The mitral valve is degenerative in appearance. There is moderate thickening of the mitral valve leaflet(s). Severe mitral annular calcification. Mild mitral valve regurgitation. No evidence of mitral valve stenosis. Tricuspid Valve: The tricuspid valve is normal in structure. Tricuspid valve regurgitation is trivial. No evidence of tricuspid stenosis. Aortic Valve: The aortic valve is tricuspid. There is mild  calcification of the aortic valve. There is mild thickening of the aortic valve. There is mild to moderate aortic valve annular calcification. Aortic valve regurgitation is not visualized. Aortic  valve sclerosis/calcification is present, without any evidence of aortic stenosis. Pulmonic Valve: The pulmonic valve was grossly normal. Pulmonic valve regurgitation is trivial. No evidence of pulmonic stenosis. Aorta: The aortic root and ascending aorta are structurally normal, with no evidence of dilitation. There is moderate (Grade III) plaque involving the descending aorta. IAS/Shunts: No atrial level shunt detected by color flow Doppler. Agitated saline contrast was given intravenously to evaluate for intracardiac shunting. Agitated saline contrast bubble study was negative, with no evidence of any interatrial shunt. Additional Comments: Spectral Doppler performed. LEFT VENTRICLE PLAX 2D LVOT diam:     1.80 cm LVOT Area:     2.54 cm   AORTA Ao Root diam: 2.40 cm  SHUNTS Systemic Diam: 1.80 cm Shelda Bruckner MD Electronically signed by Shelda Bruckner MD Signature Date/Time: 05/25/2024/2:55:21 PM    Final    EP STUDY Result Date: 05/25/2024 See surgical note for result.  VAS US  LOWER EXTREMITY VENOUS (DVT) Result Date: 05/24/2024  Lower Venous DVT Study Patient Name:  Betty Golden  Date of Exam:   05/24/2024 Medical Rec #: [id number]          Accession #:    [id number] Date of Birth: May 20, 1946          Patient Gender: F Patient Age:   73 years Exam Location:  [facility] Procedure:      VAS US  LOWER EXTREMITY VENOUS (DVT) Referring Phys: ROCKY LIKES --------------------------------------------------------------------------------  Indications: Injury to blood vessels, unspecified site T14.90.  Risk Factors: None identified. Limitations: Poor ultrasound/tissue interface. Comparison Study: No prior studies. Performing Technologist: Cordella Collet RVT  Examination Guidelines: A complete  evaluation includes B-mode imaging, spectral Doppler, color Doppler, and power Doppler as needed of all accessible portions of each vessel. Bilateral testing is considered an integral part of a complete examination.  Limited examinations for reoccurring indications may be performed as noted. The reflux portion of the exam is performed with the patient in reverse Trendelenburg.  +---------+---------------+---------+-----------+----------+--------------+ RIGHT    CompressibilityPhasicitySpontaneityPropertiesThrombus Aging +---------+---------------+---------+-----------+----------+--------------+ CFV      Full           Yes      Yes                                 +---------+---------------+---------+-----------+----------+--------------+ SFJ      Full                                                        +---------+---------------+---------+-----------+----------+--------------+ FV Prox  Full                                                        +---------+---------------+---------+-----------+----------+--------------+ FV Mid   Full                                                        +---------+---------------+---------+-----------+----------+--------------+ FV DistalFull                                                        +---------+---------------+---------+-----------+----------+--------------+ PFV      Full                                                        +---------+---------------+---------+-----------+----------+--------------+ POP      Full           Yes      Yes                                 +---------+---------------+---------+-----------+----------+--------------+ PTV      Full                                                        +---------+---------------+---------+-----------+----------+--------------+ PERO     Full                                                         +---------+---------------+---------+-----------+----------+--------------+   +---------+---------------+---------+-----------+----------+--------------+ LEFT     CompressibilityPhasicitySpontaneityPropertiesThrombus Aging +---------+---------------+---------+-----------+----------+--------------+ CFV      Full  Yes      Yes                                 +---------+---------------+---------+-----------+----------+--------------+ SFJ      Full                                                        +---------+---------------+---------+-----------+----------+--------------+ FV Prox  Full                                                        +---------+---------------+---------+-----------+----------+--------------+ FV Mid   Full                                                        +---------+---------------+---------+-----------+----------+--------------+ FV DistalFull                                                        +---------+---------------+---------+-----------+----------+--------------+ PFV      Full                                                        +---------+---------------+---------+-----------+----------+--------------+ POP      Full           Yes      Yes                                 +---------+---------------+---------+-----------+----------+--------------+ PTV      Full                                                        +---------+---------------+---------+-----------+----------+--------------+ PERO     Full                                                        +---------+---------------+---------+-----------+----------+--------------+     Summary: RIGHT: - There is no evidence of deep vein thrombosis in the lower extremity.  - No cystic structure found in the popliteal fossa.  LEFT: - There is no evidence of deep vein thrombosis in the lower extremity.  - No cystic structure found in the popliteal fossa.   *See table(s) above for measurements and observations. Electronically signed by Debby Robertson on 05/24/2024 at 1:19:10 PM.  Final    CT ANGIO HEAD NECK W WO CM Result Date: 05/24/2024 EXAM: CTA Head and Neck with Intravenous Contrast. CT Head without Contrast. CLINICAL HISTORY: Stroke/TIA, determine embolic source. TECHNIQUE: Axial CTA images of the head and neck performed with intravenous contrast. MIP reconstructed images were created and reviewed. Axial computed tomography images of the head/brain performed without intravenous contrast. Note: Per PQRS, the description of internal carotid artery percent stenosis, including 0 percent or normal exam, is based on [employer] American Symptomatic Carotid Endarterectomy Trial (NASCET) criteria. Dose reduction technique was used including one or more of the following: automated exposure control, adjustment of mA and kV according to patient size, and/or iterative reconstruction. CONTRAST: Without and with; 75mL (iohexol  (OMNIPAQUE ) 350 MG/ML injection 75 mL IOHEXOL  350 MG/ML SOLN) COMPARISON: MRI head from earlier today. CTA head and neck 04/13/2024 FINDINGS: BRAIN: No acute intraparenchymal hemorrhage. No mass lesion. 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 No midline shift or extra-axial collection. VENTRICLES: No hydrocephalus. ORBITS: The orbits are unremarkable. SINUSES AND MASTOIDS: The paranasal sinuses and mastoid air cells are clear. COMMON CAROTID ARTERIES: No significant stenosis. No dissection or occlusion. INTERNAL CAROTID ARTERIES: No stenosis by NASCET criteria. No dissection or occlusion. VERTEBRAL ARTERIES: No significant stenosis. No dissection or occlusion. ANTERIOR CEREBRAL ARTERIES: No significant stenosis. No occlusion. No aneurysm. MIDDLE CEREBRAL ARTERIES: Similar proximal left M2 MCA occlusion. No new large vessel occlusion. Right MCA is patent. POSTERIOR CEREBRAL ARTERIES: No  significant stenosis. No occlusion. No aneurysm. BASILAR ARTERY: No significant stenosis. No occlusion. No aneurysm. LUNG APICES: Unchanged opacities in the peripheral right upper lobe, which may reflect scarring. BONES: No acute osseous abnormality. Bulky bridging osteophytes in the cervical spine. IMPRESSION: 1. No evidence of acute intracranial abnormality. 2. Similar proximal left M2 MCA occlusion. No new large vessel occlusion. Electronically signed by: Gilmore Molt MD 05/24/2024 01:11 AM EDT RP Workstation: [id number]   MR BRAIN WO CONTRAST Result Date: 05/23/2024 EXAM: MRI BRAIN WITHOUT CONTRAST 05/23/2024 05:44:21 AM TECHNIQUE: Multiplanar multisequence MRI of the head/brain was performed without the administration of intravenous contrast. COMPARISON: Head CT from yesterday and Brain MRI 02/27/2024. CLINICAL HISTORY: 78 year old female with possible subacute infarct. FINDINGS: BRAIN AND VENTRICLES: Late subacute posterior left MCA territory infarcts left greater than right corresponding to CT finding. Patchy and confluent abnormal diffusion, T2 and FLAIR hyperintensity in the posterior left MCA territory, lateral perirolandic area and underlying white matter. Changes are mostly facilitated on diffusion. Cystic encephalomalacia developing. 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 Smaller area of similar abnormal signal in the contralateral posterior right periatrial white matter (series 11 image 29), new from the MRI in August. Superimposed small linear focus of restricted diffusion in the posterior left cerebellum (series 5 image 55). Minimal if any T2 and FLAIR hyperintensity there. No other abnormal diffusion identified. No hemorrhagic transformation or mass effect at any of these sites. Stable gray and white matter signal since August, including chronic T2 heterogeneity in the basal ganglia and possible tiny chronic lacunar infarct in the right cerebellar  hemisphere. No chronic cortical encephalomalacia. No chronic cerebral blood products. No intracranial hemorrhage. No mass effect. No midline shift. No hydrocephalus. The sella is unremarkable. Major vascular flow voids are stable. ORBITS: No acute abnormality. SINUSES AND MASTOIDS: No acute abnormality. BONES AND SOFT TISSUES: Normal marrow signal. No acute soft tissue abnormality. IMPRESSION: 1. Small acute infarct in the posterior left cerebellum. 2. And  Late subacute posterior MCA territory infarcts, left > right, corresponding to CT finding. 3. No hemorrhagic transformation or mass effect. Electronically signed by: Helayne Hurst MD 05/23/2024 05:55 AM EDT RP Workstation: [id number]   CT Angio Chest Pulmonary Embolism (PE) W or WO Contrast Result Date: 05/22/2024 EXAM: CTA of the Chest with contrast for PE 05/22/2024 11:46:35 PM TECHNIQUE: CTA of the chest was performed without and with the administration of 75 mL of iohexol  (OMNIPAQUE ) 350 MG/ML injection. Multiplanar reformatted images are provided for review. MIP images are provided for review. Automated exposure control, iterative reconstruction, and/or weight based adjustment of the mA/kV was utilized to reduce the radiation dose to as low as reasonably achievable. COMPARISON: None available. CLINICAL HISTORY: Concern for PE. SOB. Ordering provider gave the ok for more contrast. FINDINGS: PULMONARY ARTERIES: Pulmonary arteries are adequately opacified for evaluation. No pulmonary embolism. Main pulmonary artery is normal in caliber. MEDIASTINUM: The heart and pericardium demonstrate no acute abnormality. There are atherosclerotic calcifications of the aorta. LYMPH NODES: No mediastinal, hilar or axillary lymphadenopathy. LUNGS AND PLEURA: Mild emphysema present. There is some stable scarring in both upper lobes. No focal consolidation or pulmonary edema. No pleural effusion or pneumothorax. UPPER ABDOMEN: Limited images of the upper abdomen are  unremarkable. SOFT TISSUES AND BONES: No acute bone or soft tissue abnormality. IMPRESSION: 1. No pulmonary embolism. 2. Mild emphysema and stable scarring in both upper lobes. 3. Atherosclerotic calcifications of the aorta. Consider correlation with cardiovascular risk factors and management as clinically indicated. Electronically signed by: Greig Pique MD 05/22/2024 11:52 PM EDT RP Workstation: [id number]   CT Head Wo Contrast Result Date: 05/22/2024 EXAM: CT HEAD WITHOUT CONTRAST 05/22/2024 07:15:10 PM TECHNIQUE: CT of the head was performed without the administration of intravenous contrast. Automated exposure control, iterative reconstruction, and/or weight based adjustment of the mA/kV was utilized to reduce the radiation dose to as low as reasonably achievable. COMPARISON: CT head Sept 15, 2025 CLINICAL HISTORY: Headache. Betty Golden, a 77 y.o. female was evaluated in triage. Pt complains of multiple complaints including right eyelid droop, severe right sided headache, nausea, vomiting, diarrhea, abdominal pain, shortness of breath, as well as fatigue. FINDINGS: BRAIN AND VENTRICLES: No acute hemorrhage. 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 Similar additional patchy white matter hypodensities, compatible with chronic microvascular ischemic change. No hydrocephalus. No extra-axial collection. No mass effect or midline shift. ORBITS: No acute abnormality. SINUSES: No acute abnormality. SOFT TISSUES AND SKULL: No acute soft tissue abnormality. No skull fracture. IMPRESSION: 1. 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 An MRI could further evaluate if clinically warranted. No mass effect. Electronically signed by: Gilmore Molt MD 05/22/2024 07:41 PM EDT RP Workstation: [id number]   CT ABDOMEN PELVIS W CONTRAST Result Date: 05/22/2024 EXAM: CT ABDOMEN AND PELVIS WITH CONTRAST 05/22/2024  07:15:10 PM TECHNIQUE: CT of the abdomen and pelvis was performed with the administration of 75 mL of iohexol  (OMNIPAQUE ) 350 MG/ML injection. Multiplanar reformatted images are provided for review. Automated exposure control, iterative reconstruction, and/or weight-based adjustment of the mA/kV was utilized to reduce the radiation dose to as low as reasonably achievable. COMPARISON: None available. CLINICAL HISTORY: Abdominal pain, acute, nonlocalized; Nausea vomiting diarrhea. FINDINGS: LOWER CHEST: No acute abnormality. LIVER: The liver is unremarkable. GALLBLADDER AND BILE DUCTS: Status post cholecystectomy. Mild dilatation of the common duct, measuring 11 mm and smoothly tapering of the ampulla, likely postsurgical. SPLEEN: No acute abnormality. PANCREAS: No acute abnormality. ADRENAL  GLANDS: No acute abnormality. KIDNEYS, URETERS AND BLADDER: Multifocal cortical scarring of the right kidney with multiple renal cysts measuring up to 19 mm, benign. No follow-up is recommended. No stones in the kidneys or ureters. No hydronephrosis. No perinephric or periureteral stranding. Urinary bladder is unremarkable. GI AND BOWEL: Stomach demonstrates no acute abnormality. Sigmoid diverticulosis without convincing pericolonic stranding to suggest acute diverticulitis. There is no bowel obstruction. PERITONEUM AND RETROPERITONEUM: No ascites. No free air. VASCULATURE: Atherosclerotic calcifications of the abdominal aorta and branch vessels, although patent. LYMPH NODES: No lymphadenopathy. REPRODUCTIVE ORGANS: Uterus is unremarkable. BONES AND SOFT TISSUES: Degenerative changes of the visualized thoracolumbar spine. Mild degenerative changes of the bilateral hips. No acute osseous abnormality. No focal soft tissue abnormality. IMPRESSION: 1. No acute findings. 2. Sigmoid diverticulosis, without convincing findings to suggest acute diverticulitis. 3. Additional ancillary findings as above. Electronically signed by: Pinkie Pebbles MD 05/22/2024 07:31 PM EDT RP Workstation: [id number]   DG Chest 2 View if patient is not in a treatment room. Result Date: 05/22/2024 CLINICAL DATA:  Suspected sepsis. Vomiting, diarrhea, and headache. Shortness of breath. EXAM: CHEST - 2 VIEW COMPARISON:  05/06/2024. FINDINGS: The heart size and mediastinal contours are within normal limits. There is stable scarring at the left lung base. No consolidation, effusion, or pneumothorax is seen. A stable sclerotic lesion is noted in the scapula on the right, likely bone island. No acute osseous abnormality is seen. IMPRESSION: Stable chest with no active cardiopulmonary disease. Electronically Signed   By: Leita Birmingham M.D.   On: 05/22/2024 17:33   ECHOCARDIOGRAM COMPLETE Result Date: 05/07/2024    ECHOCARDIOGRAM REPORT   Patient Name:   Betty Golden Date of Exam: 05/07/2024 Medical Rec #:  [id number]         Height:       63.0 in Accession #:    [id number]        Weight:       152.1 lb Date of Birth:  1946/06/14         BSA:          1.721 m Patient Age:    78 years          BP:           153/77 mmHg Patient Gender: F                 HR:           111 bpm. Exam Location:  Inpatient Procedure: 2D Echo (Both Spectral and Color Flow Doppler were utilized during            procedure). Indications:    Elevated Troponin  History:        Patient has prior history of Echocardiogram examinations.                 Signs/Symptoms:Elevated Troponin.  Sonographer:    Norleen Amour Referring Phys: [id number] SUBRINA SUNDIL IMPRESSIONS  1. Left ventricular ejection fraction, by estimation, is 70 to 75%. The left ventricle has hyperdynamic function. The left ventricle has no regional wall motion abnormalities. Left ventricular diastolic parameters are consistent with Grade I diastolic dysfunction (impaired relaxation).  2. Right ventricular systolic function is normal. The right ventricular size is normal.  3. There is abnormal motion on the ventricular aspect of  the valve, near the severe MAC (see image 52). Unclear if this is subvalvular apparatus or other structure. Not typical location of vegetation, but etiology unclear. The mitral valve was  not well visualized. Trivial mitral valve regurgitation. No evidence of mitral stenosis. Severe mitral annular calcification.  4. The aortic valve is grossly normal. There is mild calcification of the aortic valve. There is mild thickening of the aortic valve. Aortic valve regurgitation is not visualized. Comparison(s): Changes from prior study are noted. Conclusion(s)/Recommendation(s): See comments re: abnormal motion below the mitral valve annulus. While this is not typical location for vegetation, if there is clinical concern for endocarditis, would recommend TEE for further evaluation. FINDINGS  Left Ventricle: Left ventricular ejection fraction, by estimation, is 70 to 75%. The left ventricle has hyperdynamic function. The left ventricle has no regional wall motion abnormalities. The left ventricular internal cavity size was normal in size. There is no left ventricular hypertrophy. Left ventricular diastolic parameters are consistent with Grade I diastolic dysfunction (impaired relaxation). Right Ventricle: The right ventricular size is normal. Right vetricular wall thickness was not well visualized. Right ventricular systolic function is normal. Left Atrium: Left atrial size was normal in size. Right Atrium: Right atrial size was normal in size. Pericardium: Trivial pericardial effusion is present. Mitral Valve: There is abnormal motion on the ventricular aspect of the valve, near the severe MAC (see image 52). Unclear if this is subvalvular apparatus or other structure. Not typical location of vegetation, but etiology unclear. The mitral valve was  not well visualized. Severe mitral annular calcification. Trivial mitral valve regurgitation. No evidence of mitral valve stenosis. MV peak gradient, 16.3 mmHg. The mean mitral  valve gradient is 7.0 mmHg. Tricuspid Valve: The tricuspid valve is grossly normal. Tricuspid valve regurgitation is trivial. No evidence of tricuspid stenosis. Aortic Valve: The aortic valve is grossly normal. There is mild calcification of the aortic valve. There is mild thickening of the aortic valve. Aortic valve regurgitation is not visualized. Pulmonic Valve: The pulmonic valve was not well visualized. Pulmonic valve regurgitation is not visualized. No evidence of pulmonic stenosis. Aorta: The aortic root, ascending aorta, aortic arch and descending aorta are all structurally normal, with no evidence of dilitation or obstruction. Venous: The inferior vena cava was not well visualized. IAS/Shunts: The atrial septum is grossly normal.  LEFT VENTRICLE PLAX 2D LVIDd:         3.70 cm     Diastology LVIDs:         2.10 cm     LV e' medial:  8.70 cm/s LV PW:         0.90 cm     LV e' lateral: 7.30 cm/s LV IVS:        0.80 cm LVOT diam:     1.80 cm LV SV:         57 LV SV Index:   33 LVOT Area:     2.54 cm  LV Volumes (MOD) LV vol d, MOD A2C: 37.7 ml LV vol d, MOD A4C: 44.2 ml LV vol s, MOD A2C: 14.1 ml LV vol s, MOD A4C: 18.5 ml LV SV MOD A2C:     23.6 ml LV SV MOD A4C:     44.2 ml LV SV MOD BP:      26.2 ml RIGHT VENTRICLE RV Basal diam:  2.20 cm RV S prime:     12.10 cm/s TAPSE (M-mode): 1.8 cm LEFT ATRIUM             Index        RIGHT ATRIUM          Index LA diam:  4.70 cm 2.73 cm/m   RA Area:     9.61 cm LA Vol (A2C):   25.2 ml 14.64 ml/m  RA Volume:   18.90 ml 10.98 ml/m LA Vol (A4C):   26.5 ml 15.40 ml/m LA Biplane Vol: 26.8 ml 15.57 ml/m  AORTIC VALVE             PULMONIC VALVE LVOT Vmax:   132.00 cm/s PV Vmax:       1.04 m/s LVOT Vmean:  97.600 cm/s PV Peak grad:  4.3 mmHg LVOT VTI:    0.223 m  AORTA Ao Root diam: 2.60 cm Ao Asc diam:  2.50 cm MITRAL VALVE              TRICUSPID VALVE MV Area VTI:  2.07 cm    TR Peak grad:   37.0 mmHg MV Peak grad: 16.3 mmHg   TR Vmax:        304.00 cm/s MV  Mean grad: 7.0 mmHg MV Vmax:      2.02 m/s    SHUNTS MV Vmean:     124.0 cm/s  Systemic VTI:  0.22 m                           Systemic Diam: 1.80 cm Shelda Bruckner MD Electronically signed by Shelda Bruckner MD Signature Date/Time: 05/07/2024/11:41:39 AM    Final    CT Angio Chest PE W and/or Wo Contrast Result Date: 05/06/2024 CLINICAL DATA:  Pulmonary embolus suspected with high probability. Chest pain radiating to the left arm. History of cardiac issues. EXAM: CT ANGIOGRAPHY CHEST WITH CONTRAST TECHNIQUE: Multidetector CT imaging of the chest was performed using the standard protocol during bolus administration of intravenous contrast. Multiplanar CT image reconstructions and MIPs were obtained to evaluate the vascular anatomy. RADIATION DOSE REDUCTION: This exam was performed according to the departmental dose-optimization program which includes automated exposure control, adjustment of the mA and/or kV according to patient size and/or use of iterative reconstruction technique. CONTRAST:  75mL OMNIPAQUE  IOHEXOL  350 MG/ML SOLN COMPARISON:  Chest radiograph 05/06/2024.  CT chest 09/25/2021 FINDINGS: Cardiovascular: Technically adequate study with good opacification of the central and segmental pulmonary arteries. Mild motion artifact. No focal filling defects. No evidence of significant pulmonary embolus. Normal caliber thoracic aorta. No aortic dissection. Normal heart size. No pericardial effusions. Mild scattered aortic calcification. Mediastinum/Nodes: Esophagus is decompressed. No significant lymphadenopathy. Thyroid gland is unremarkable. Lungs/Pleura: Diffuse emphysematous changes throughout the lungs. Patchy ground-glass infiltrates bilaterally likely representing multifocal pneumonia or possibly edema. No pleural effusion or pneumothorax. Upper Abdomen: No acute abnormality. Musculoskeletal: Degenerative changes in the spine and shoulders. No acute bony abnormalities. Review of the MIP  images confirms the above findings. IMPRESSION: 1. No evidence of significant pulmonary embolus. 2. Prominent emphysematous changes throughout the lungs. 3. Patchy ground-glass infiltrates in the lungs likely represent multifocal pneumonia. 4. Mild aortic atherosclerosis. Electronically Signed   By: Elsie Gravely M.D.   On: 05/06/2024 22:51   DG Chest Portable 1 View Result Date: 05/06/2024 EXAM: 1 VIEW XRAY OF THE CHEST 05/06/2024 07:54:00 PM COMPARISON: None available. CLINICAL HISTORY: dib, cap?. CP, SOB and palpitations. Recent stroke. FINDINGS: LUNGS AND PLEURA: No focal pulmonary opacity. No pulmonary edema. No pleural effusion. No pneumothorax. HEART AND MEDIASTINUM: No acute abnormality of the cardiac and mediastinal silhouettes. BONES AND SOFT TISSUES: Osseous structures are age appropriate. IMPRESSION: 1. No acute process. Electronically signed by: Dorethia Molt MD 05/06/2024 07:58 PM  EDT RP Workstation: [id number]    Microbiology: Results for orders placed or performed during the hospital encounter of 05/22/24  Culture, blood (Routine x 2)     Status: None (Preliminary result)   Collection Time: 05/22/24  4:26 PM   Specimen: BLOOD RIGHT ARM  Result Value Ref Range Status   Specimen Description BLOOD RIGHT ARM  Final   Special Requests   Final    BOTTLES DRAWN AEROBIC AND ANAEROBIC Blood Culture adequate volume   Culture   Final    NO GROWTH 4 DAYS Performed at [facility], [address] [place], [place] [address]    Report Status PENDING  Incomplete  Culture, blood (Routine x 2)     Status: None (Preliminary result)   Collection Time: 05/22/24  4:47 PM   Specimen: BLOOD LEFT ARM  Result Value Ref Range Status   Specimen Description BLOOD LEFT ARM  Final   Special Requests   Final    AEROBIC BOTTLE ONLY Blood Culture results may not be optimal due to an inadequate volume of blood received in culture bottles   Culture   Final    NO GROWTH 4 DAYS Performed at [facility], [address] [place], [place] [address]    Report Status PENDING  Incomplete    Labs: CBC: Recent Labs  Lab 05/22/24 1648 05/24/24 0436  WBC 18.7* 11.3*  NEUTROABS 12.8*  --   HGB 13.9 10.5*  HCT 43.1 32.2*  MCV 85.9 87.7  PLT 228 151   Basic Metabolic Panel: Recent Labs  Lab 05/22/24 1648 05/23/24 0452 05/24/24 0436 05/24/24 1352 05/25/24 0431  NA 140 138 141  --  141  K 4.9 3.7 2.9* 3.7 3.6  CL 109 108 109  --  114*  CO2 15* 16* 19*  --  18*  GLUCOSE 167* 107* 73  --  78  BUN 14 12 5*  --  6*  CREATININE 1.57* 1.22* 1.00  --  0.98  CALCIUM  10.3 9.2 8.7*  --  8.7*  MG  --   --  1.6*  --  2.2   Liver Function Tests: Recent Labs  Lab 05/22/24 1648 05/23/24 0452  AST 32 26  ALT 22 17  ALKPHOS 66 58  BILITOT 1.1 0.9  PROT 7.6 6.2*  ALBUMIN 3.9 3.3*   CBG: No results for input(s): GLUCAP in the last 168 hours.  Discharge time spent: greater than 30 minutes.  Signed: Landon FORBES Baller, MD [facility] 05/26/2024

## 2024-05-26 NOTE — Progress Notes (Addendum)
 STROKE TEAM PROGRESS NOTE   INTERIM HISTORY/SUBJECTIVE No family at the bedside. Patient is lying in bed, still has stuttering speech and mild expressive aphasia. Neuro stable. Pending loop recorder.   CBC    Component Value Date/Time   WBC 11.3 (H) 05/24/2024 0436   RBC 3.67 (L) 05/24/2024 0436   HGB 10.5 (L) 05/24/2024 0436   HGB 12.8 11/14/2023 1357   HCT 32.2 (L) 05/24/2024 0436   PLT 151 05/24/2024 0436   PLT 250 11/14/2023 1357   MCV 87.7 05/24/2024 0436   MCH 28.6 05/24/2024 0436   MCHC 32.6 05/24/2024 0436   RDW 14.3 05/24/2024 0436   LYMPHSABS 4.7 (H) 05/22/2024 1648   MONOABS 1.0 05/22/2024 1648   EOSABS 0.0 05/22/2024 1648   BASOSABS 0.1 05/22/2024 1648    BMET    Component Value Date/Time   NA 141 05/25/2024 0431   NA 143 09/08/2017 0942   K 3.6 05/25/2024 0431   CL 114 (H) 05/25/2024 0431   CO2 18 (L) 05/25/2024 0431   GLUCOSE 78 05/25/2024 0431   BUN 6 (L) 05/25/2024 0431   BUN 18 09/08/2017 0942   CREATININE 0.98 05/25/2024 0431   CREATININE 1.20 (H) 11/14/2023 1357   CALCIUM  8.7 (L) 05/25/2024 0431   GFRNONAA 60 (L) 05/25/2024 0431   GFRNONAA 47 (L) 11/14/2023 1357    IMAGING past 24 hours ECHO TEE Result Date: 05/25/2024    TRANSESOPHOGEAL ECHO REPORT   Patient Name:   BEZA STEPPE Date of Exam: 05/25/2024 Medical Rec #:  [id number]         Height:       63.0 in Accession #:    [id number]        Weight:       152.0 lb Date of Birth:  September 19, 1945         BSA:          1.721 m Patient Age:    78 years          BP:           144/79 mmHg Patient Gender: F                 HR:           87 bpm. Exam Location:  Inpatient Procedure: Transesophageal Echo, Cardiac Doppler, Limited Color Doppler, Saline            Contrast Bubble Study and 3D Echo (Both Spectral and Color Flow            Doppler were utilized during procedure). Indications:     Endocarditis  History:         Patient has prior history of Echocardiogram examinations, most                  recent  05/07/2024. COPD and hx of CVA, Severe Mitral Annular                  Calcification; Risk Factors:Hypertension, Dyslipidemia and                  Former Smoker.  Sonographer:     Koleen Popper RDCS Referring Phys:  [id number] ROLLO JONELLE LOUDER Diagnosing Phys: Shelda Bruckner MD  Sonographer Comments: Image acquisition challenging due to respiratory motion. PROCEDURE: After discussion of the risks and benefits of a TEE, an informed consent was obtained from the patient. The transesophogeal probe was passed without difficulty through the esophogus of the patient. Imaged were  obtained with the patient in a left lateral decubitus position. Sedation performed by different physician. The patient was monitored while under deep sedation. Anesthestetic sedation was provided intravenously by Anesthesiology: 240mg  of Propofol , 80mg  of Lidocaine . Image quality was adequate. The patient's vital signs; including heart rate, blood pressure, and oxygen saturation; remained stable throughout the procedure. The patient developed no complications during the procedure.  IMPRESSIONS  1. Left ventricular ejection fraction, by estimation, is 65 to 70%. The left ventricle has normal function.  2. Right ventricular systolic function is normal. The right ventricular size is normal.  3. No left atrial/left atrial appendage thrombus was detected. The LAA emptying velocity was 49 cm/s.  4. There is a small mobile structure on A2 that extends into the atrium, suspect ruptured chordae or degenerative portion of valve. No bacteremia, but in different clinical scenario would be concerning for endocarditis. There is a large focal nodule of calcification on the posterior leaflet, near P2 and the mitral annulus. Below the leaflets, there are calcified and thickened chordae with some mobility on the ventricular aspect of the valve. The mitral valve is degenerative. Mild mitral valve regurgitation. No evidence of mitral stenosis. Severe mitral  annular calcification.  5. The aortic valve is tricuspid. There is mild calcification of the aortic valve. There is mild thickening of the aortic valve. Aortic valve regurgitation is not visualized. Aortic valve sclerosis/calcification is present, without any evidence of aortic stenosis.  6. There is Moderate (Grade III) plaque involving the descending aorta.  7. Agitated saline contrast bubble study was negative, with no evidence of any interatrial shunt.  8. 3D performed of the mitral valve and demonstrates Focal calcified nodule on P2/annulus, without mobility. Small mobile structure on A2. Small focal calcified/mobile structures in subvalvular apparatus. 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 No clear cardiac source of embolism. FINDINGS  Left Ventricle: Left ventricular ejection fraction, by estimation, is 65 to 70%. The left ventricle has normal function. The left ventricular internal cavity size was normal in size. Right Ventricle: The right ventricular size is normal. No increase in right ventricular wall thickness. Right ventricular systolic function is normal. Left Atrium: Left atrial size was normal in size. No left atrial/left atrial appendage thrombus was detected. The LAA emptying velocity was 49 cm/s. Right Atrium: Right atrial size was normal in size. Prominent Eustachian valve. Pericardium: Trivial pericardial effusion is present. Mitral Valve: There is a small mobile structure on A2 that extends into the atrium, suspect ruptured chordae or degenerative portion of valve. No bacteremia, but in different clinical scenario would be concerning for endocarditis. There is a large focal nodule of calcification on the posterior leaflet, near P2 and the mitral annulus. Below the leaflets, there are calcified and thickened chordae with some mobility on the ventricular aspect of the valve. The mitral valve is degenerative in appearance. There is  moderate thickening of the mitral valve leaflet(s). Severe mitral annular calcification. Mild mitral valve regurgitation. No evidence of mitral valve stenosis. Tricuspid Valve: The tricuspid valve is normal in structure. Tricuspid valve regurgitation is trivial. No evidence of tricuspid stenosis. Aortic Valve: The aortic valve is tricuspid. There is mild calcification of the aortic valve. There is mild thickening of the aortic valve. There is mild to moderate aortic valve annular calcification. Aortic valve regurgitation is not visualized. Aortic  valve sclerosis/calcification is present, without any evidence of aortic stenosis. Pulmonic Valve: The pulmonic valve was grossly normal. Pulmonic valve regurgitation is trivial. No evidence of  pulmonic stenosis. Aorta: The aortic root and ascending aorta are structurally normal, with no evidence of dilitation. There is moderate (Grade III) plaque involving the descending aorta. IAS/Shunts: No atrial level shunt detected by color flow Doppler. Agitated saline contrast was given intravenously to evaluate for intracardiac shunting. Agitated saline contrast bubble study was negative, with no evidence of any interatrial shunt. Additional Comments: Spectral Doppler performed. LEFT VENTRICLE PLAX 2D LVOT diam:     1.80 cm LVOT Area:     2.54 cm   AORTA Ao Root diam: 2.40 cm  SHUNTS Systemic Diam: 1.80 cm Shelda Bruckner MD Electronically signed by Shelda Bruckner MD Signature Date/Time: 05/25/2024/2:55:21 PM    Final      Vitals:   05/26/24 0039 05/26/24 0504 05/26/24 0748 05/26/24 1224  BP: (!) 143/57 (!) 140/46 (!) 158/82 132/67  Pulse: 89 95 99 99  Resp:  18    Temp: 98.3 F (36.8 C) 98.2 F (36.8 C) 98.3 F (36.8 C) 98.1 F (36.7 C)  TempSrc:  Oral    SpO2: 96% 98% 98% 98%  Weight:      Height:         PHYSICAL EXAM General:  Alert, well-nourished, well-developed patient in no acute distress Psych:  Mood and affect appropriate for  situation CV: Regular rate and rhythm on monitor Respiratory:  Regular, unlabored respirations on room air GI: Abdomen soft and nontender   NEURO:  awake, alert, eyes open, mild stuttering speech present. She did have some word finding difficulty and perseveration with paraphasic errors. Orientated to place, told me age 7 instead of 89, told me month August instead of October and year 2022 instead of 2025. Mildly dysarthria. Following all simple commands. Able to name 3/4 and repeat simple sentences. No gaze palsy, tracking bilaterally, visual field full, PERRL. No facial droop. Tongue midline. Bilateral UEs 5/5, no drift. Bilaterally LEs 5/5, no drift. Sensation symmetrical bilaterally, b/l FTN intact, gait not tested.    ASSESSMENT/PLAN  Ms. Jazmon J Tamburri is a 78 y.o. 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  MRI of the brain showed incidental small acute infarct in the posterior left cerebellum.     Stroke:  left punctate cerebellum infarct, late subacute left parietal infarct, etiology:  likely cardioembolic   CT head No acute abnormality, subacute left parietal infarct.  CTA head & neck similar proximal left M2 MCA occlusion. No new large vessel occlusion  MRI  Small acute infarct in the posterior left cerebellum. And Late subacute posterior MCA territory infarcts, left > right LE venous US  negative  2D Echo 10/10: EF 70-75% abnormal motion on the ventricular aspect of the valve, near the severe MAC  TEE showed MV degenerative changes.  Pending loop recorder before discharge LDL 25 HgbA1c 4.7 VTE prophylaxis - lovenox   clopidogrel  75 mg daily prior to admission, now on aspirin  81 mg daily and clopidogrel  75 mg daily for 3 months and then plavix  alone. Therapy recommendations: HH PT Disposition:  pending    MV degeneration TEE showed The MV  leaflets are thickened and degenerative. There is a small mobile structure on A2 that extends into the atrium, suspect ruptured chordae or degenerative portion of valve. No bacteremia, but in different clinical scenario would be concerning for endocarditis. There is a large focal nodule of calcification on the posterior leaflet, near  P2 and the mitral annulus. Below the leaflets, there are calcified and thickened chordae with some mobility on the ventricular aspect of the valve. Discussed with Dr. Lonni cardiology, no [facility] indicated at this time Pending loop recorder to rule out afib  Hx of Stroke/TIA 03/2024 admitted for altered mental status.  CT showed chronic right parietal infarct but questionable left acute parietal infarct.  CT head and neck showed left M2 occlusion with distal reconstitution.  LDL 98, A1c 4.7.  Discharged on DAPT for 3 months and recommended 30-day CardioNet monitoring.  Hypertension Home meds:  amlodipine  10mg , metoprolol  50mg   Stable On home Norvasc  10, metoprolol  50 Long-term BP goal normotensive  Hyperlipidemia Home meds:  crestor  20mg ,  resumed in hospital LDL 25, goal < 70 Continue statin at discharge  Other Stroke Risk Factors Advanced age Coronary artery disease Congestive heart failure  Other Active Problems RA  GERD Depression  Fibromyalgia   Hospital day # 4  Neurology will sign off. Please call with questions. Pt will follow up with Dr. Onita at 07/27/24 as scheduled at [facility]. Thanks for the consult.   Ary Cummins, MD PhD Stroke Neurology 05/26/2024 2:27 PM    To contact Stroke Continuity provider, please refer to [email or web address]. After hours, contact General Neurology

## 2024-05-27 ENCOUNTER — Encounter (HOSPITAL_COMMUNITY): Payer: Self-pay | Admitting: Student

## 2024-05-27 ENCOUNTER — Emergency Department (HOSPITAL_COMMUNITY)

## 2024-05-27 ENCOUNTER — Encounter: Admitting: Registered Nurse

## 2024-05-27 ENCOUNTER — Emergency Department (HOSPITAL_COMMUNITY): Admission: EM | Admit: 2024-05-27 | Discharge: 2024-05-27 | Disposition: A | Discharge status: Home / Self Care

## 2024-05-27 ENCOUNTER — Other Ambulatory Visit: Payer: Self-pay

## 2024-05-27 DIAGNOSIS — S3011XA Contusion of abdominal wall, initial encounter: Secondary | ICD-10-CM | POA: Insufficient documentation

## 2024-05-27 DIAGNOSIS — R109 Unspecified abdominal pain: Secondary | ICD-10-CM | POA: Insufficient documentation

## 2024-05-27 DIAGNOSIS — T148XXA Other injury of unspecified body region, initial encounter: Secondary | ICD-10-CM

## 2024-05-27 DIAGNOSIS — G44209 Tension-type headache, unspecified, not intractable: Secondary | ICD-10-CM

## 2024-05-27 DIAGNOSIS — X58XXXA Exposure to other specified factors, initial encounter: Secondary | ICD-10-CM | POA: Insufficient documentation

## 2024-05-27 DIAGNOSIS — R519 Headache, unspecified: Secondary | ICD-10-CM | POA: Insufficient documentation

## 2024-05-27 LAB — CBC WITH DIFFERENTIAL/PLATELET
Abs Immature Granulocytes: 0.05 K/uL (ref 0.00–0.07)
Basophils Absolute: 0 K/uL (ref 0.0–0.1)
Basophils Relative: 0 %
Eosinophils Absolute: 0 K/uL (ref 0.0–0.5)
Eosinophils Relative: 0 %
HCT: 37.4 % (ref 36.0–46.0)
Hemoglobin: 12.2 g/dL (ref 12.0–15.0)
Immature Granulocytes: 0 %
Lymphocytes Relative: 36 %
Lymphs Abs: 4.2 K/uL — ABNORMAL HIGH (ref 0.7–4.0)
MCH: 27.5 pg (ref 26.0–34.0)
MCHC: 32.6 g/dL (ref 30.0–36.0)
MCV: 84.4 fL (ref 80.0–100.0)
Monocytes Absolute: 1.1 K/uL — ABNORMAL HIGH (ref 0.1–1.0)
Monocytes Relative: 9 %
Neutro Abs: 6.2 K/uL (ref 1.7–7.7)
Neutrophils Relative %: 55 %
Platelets: 195 K/uL (ref 150–400)
RBC: 4.43 MIL/uL (ref 3.87–5.11)
RDW: 13.9 % (ref 11.5–15.5)
WBC: 11.6 K/uL — ABNORMAL HIGH (ref 4.0–10.5)
nRBC: 0 % (ref 0.0–0.2)

## 2024-05-27 LAB — I-STAT CHEM 8, ED
BUN: 12 mg/dL (ref 8–23)
Calcium, Ion: 0.87 mmol/L — CL (ref 1.15–1.40)
Chloride: 109 mmol/L (ref 98–111)
Creatinine, Ser: 1.1 mg/dL — ABNORMAL HIGH (ref 0.44–1.00)
Glucose, Bld: 102 mg/dL — ABNORMAL HIGH (ref 70–99)
HCT: 35 % — ABNORMAL LOW (ref 36.0–46.0)
Hemoglobin: 11.9 g/dL — ABNORMAL LOW (ref 12.0–15.0)
Potassium: 3.7 mmol/L (ref 3.5–5.1)
Sodium: 139 mmol/L (ref 135–145)
TCO2: 19 mmol/L — ABNORMAL LOW (ref 22–32)

## 2024-05-27 LAB — COMPREHENSIVE METABOLIC PANEL WITH GFR
ALT: 19 U/L (ref 0–44)
AST: 27 U/L (ref 15–41)
Albumin: 3.4 g/dL — ABNORMAL LOW (ref 3.5–5.0)
Alkaline Phosphatase: 67 U/L (ref 38–126)
Anion gap: 16 — ABNORMAL HIGH (ref 5–15)
BUN: 11 mg/dL (ref 8–23)
CO2: 18 mmol/L — ABNORMAL LOW (ref 22–32)
Calcium: 9.5 mg/dL (ref 8.9–10.3)
Chloride: 105 mmol/L (ref 98–111)
Creatinine, Ser: 1.2 mg/dL — ABNORMAL HIGH (ref 0.44–1.00)
GFR, Estimated: 47 mL/min — ABNORMAL LOW (ref >60)
Glucose, Bld: 115 mg/dL — ABNORMAL HIGH (ref 70–99)
Potassium: 3.8 mmol/L (ref 3.5–5.1)
Sodium: 139 mmol/L (ref 135–145)
Total Bilirubin: 1.2 mg/dL (ref 0.0–1.2)
Total Protein: 6.6 g/dL (ref 6.5–8.1)

## 2024-05-27 LAB — VITAMIN B1: Vitamin B1 (Thiamine): 82.5 nmol/L (ref 66.5–200.0)

## 2024-05-27 LAB — CULTURE, BLOOD (ROUTINE X 2)
Culture: NO GROWTH
Culture: NO GROWTH
Special Requests: ADEQUATE

## 2024-05-27 LAB — APTT: aPTT: 24 s (ref 24–36)

## 2024-05-27 LAB — PROTIME-INR
INR: 1 (ref 0.8–1.2)
Prothrombin Time: 13.7 s (ref 11.4–15.2)

## 2024-05-27 MED ORDER — IOHEXOL 350 MG/ML SOLN: 75.0000 mL | Freq: Once | INTRAVENOUS | Status: DC | PRN

## 2024-05-27 MED ORDER — CALCIUM CARBONATE ANTACID 500 MG PO CHEW
400.0000 mg | CHEWABLE_TABLET | Freq: Once | ORAL | Status: AC
  Administered 2024-05-27: 400 mg via ORAL
  Filled 2024-05-27: qty 2

## 2024-05-27 MED ORDER — LORAZEPAM 1 MG PO TABS
0.5000 mg | ORAL_TABLET | Freq: Once | ORAL | Status: AC
Start: 2024-05-27 — End: 2024-05-27
  Administered 2024-05-27: 0.5 mg via ORAL
  Filled 2024-05-27: qty 1

## 2024-05-27 NOTE — ED Triage Notes (Signed)
 QUICK TRIAGE: Pt to ER states she was discharged from hospital yesterday and is not feeling any better.  Report large bruise to right side of abdomen that has increased in size.

## 2024-05-27 NOTE — ED Notes (Signed)
 ED tech attempted to stick for blood. Blood draw was unsuccessful.

## 2024-05-27 NOTE — ED Triage Notes (Signed)
 In addition to initial triage note, pt also reports ongoing headaches that has not improved. PT does have significant bruising noted to right flank. Reports she was not given any injections in that area while in the hospital.

## 2024-05-27 NOTE — ED Notes (Signed)
 Patient transported to CT

## 2024-05-27 NOTE — ED Provider Triage Note (Signed)
 Emergency Medicine Provider Triage Evaluation Note  Betty Golden , a 78 y.o. female  was evaluated in triage.  Pt complains of painful right hematoma in the right flank and right lower abdomen  Review of Systems  Positive: Hematoma Negative: Chest pain, shortness of breath, dizziness, falls  Physical Exam  BP 136/87 (BP Location: Left Arm)   Pulse (!) 116   Temp 97.8 F (36.6 C)   Resp 17   SpO2 100%  Gen:   Awake, no distress   Resp:  Normal effort  MSK:   Moves extremities without difficulty  Other:    Medical Decision Making  Medically screening exam initiated at 5:14 PM.  Appropriate orders placed.  Betty Golden was informed that the remainder of the evaluation will be completed by another provider, this initial triage assessment does not replace that evaluation, and the importance of remaining in the ED until their evaluation is complete.  78 year old female presents to the ED with complaints of hematoma on her right lower abdomen and right flank.  Patient was discharged from the hospital yesterday for stroke.  Family advises she was placed on Plavix .  There has not been any falls or traumas.  Patient has been home less than 24 hours.  She has significant pain noted to the hematoma and denies any Lovenox .  Patient does not have any current neurodeficits but does endorse a mild headache.   Myriam Fonda RAMAN, [place] 05/27/24 [phone]

## 2024-05-27 NOTE — Discharge Instructions (Addendum)
 You can purchase excedrin over the counter for migraine relief. Purchase the one without aspirin  since she is already on aspirin .    The bruising is likely from the shots that she received in the hospital  Please make sure to have repeat labs in the next week to make sure calcium  stays normal.  If she has any kind of fever or chills please come back to the ED.

## 2024-05-27 NOTE — ED Provider Notes (Signed)
 [place] [facility]MENT AT [facility] Provider Note   CSN: [id number] Arrival date & time: 05/27/24  1605     Patient presents with: Bleeding/Bruising   Betty Golden is a 78 y.o. female.   HPI    Patient presents because of right-sided flank pain with abdominal pain as well as some bruising to this area.  Ongoing headache.  Headache has been present for the past week or 2.  Last took Tylenol  earlier today.  No vision changes.  No neurologic changes compared to baseline since her most recent diagnosis of CVA.  Daughter is not appreciating, new weakness or dysarthria or aphasia.  At baseline.  No fever no chills.  No neck pain.  Also endorsing right flank pain.  Daughter noticed that she has some bruising to his right flank area that seems to be getting darker and maybe little bit bigger.  Patient's not sure about her last bowel movement.  Maybe had some nausea this morning but no emesis.  Still passing flatulence.  No chest pain or shortness of breath.  No pleuritic chest pain or hemoptysis.  Patient's been compliant with her medication   Previous medical history reviewed : Patient was discharged yesterday.  Admitted in setting of sepsis.  Presumed secondary to GI source.  Incidental stroke on MRI.  Discharged on 81 mg of aspirin  as well as 75 mg of clopidogrel  for 3 months.    Prior to Admission medications   Medication Sig Start Date End Date Taking? Authorizing Provider  acetaminophen  (TYLENOL ) 325 MG tablet Take 1-2 tablets (325-650 mg total) by mouth every 4 (four) hours as needed for mild pain (pain score 1-3). Patient taking differently: Take 650 mg by mouth daily as needed for mild pain (pain score 1-3) or moderate pain (pain score 4-6). 04/19/24   Love, Sharlet RAMAN, PA-C  albuterol  (PROAIR  HFA) 108 (90 Base) MCG/ACT inhaler Inhale 2 puffs into the lungs every 4 (four) hours as needed for wheezing or shortness of breath. 04/16/23   Iva Marty Saltness, MD   amLODipine  (NORVASC ) 10 MG tablet Take 1 tablet (10 mg total) by mouth daily. 04/28/24   Love, Sharlet RAMAN, PA-C  aspirin  EC 81 MG tablet Take 1 tablet (81 mg total) by mouth daily. Swallow whole. 05/27/24   Dibia, Pauline E, MD  benralizumab  (FASENRA ) 30 MG/ML prefilled syringe Inject 1 mL (30 mg total) into the skin every 8 (eight) weeks. Patient not taking: Reported on 05/22/2024 07/31/23   Iva Marty Saltness, MD  Certolizumab Pegol (CIMZIA [place]) Inject 1 Dose into the skin every 14 (fourteen) days. Patient not taking: Reported on 05/22/2024    [provider]  clindamycin (CLEOCIN T) 1 % lotion Apply 1 Application topically 2 (two) times daily.    [provider]  clopidogrel  (PLAVIX ) 75 MG tablet Take 1 tablet (75 mg total) by mouth daily. 04/28/24 07/27/24  Maurice Sharlet RAMAN, PA-C  DULoxetine  (CYMBALTA ) 30 MG capsule Take 1 capsule (30 mg total) by mouth 2 (two) times daily. 04/28/24   Love, Sharlet RAMAN, PA-C  ferrous sulfate 325 (65 FE) MG EC tablet Take 325 mg by mouth daily.    [provider]  Fluticasone -Umeclidin-Vilant (TRELEGY ELLIPTA ) 200-62.5-25 MCG/ACT AEPB Inhale 1 puff into the lungs in the morning. Patient taking differently: Inhale 2 puffs into the lungs in the morning. 10/15/23   Iva Marty Saltness, MD  leflunomide  (ARAVA ) 20 MG tablet Take 20 mg by mouth daily.    [provider]  melatonin 5 MG TABS Take 1 tablet (5 mg total) by mouth at bedtime. 04/28/24   Love, Sharlet RAMAN, PA-C  metoprolol  succinate (TOPROL  XL) 50 MG 24 hr tablet Take 1 tablet (50 mg total) by mouth daily. 05/09/24 06/08/24  Laurence Locus, DO  pantoprazole  (PROTONIX ) 40 MG tablet Take 1 tablet (40 mg total) by mouth daily. 04/28/24   Love, Sharlet RAMAN, PA-C  potassium chloride  (KLOR-CON  M) 10 MEQ tablet Take 1 tablet (10 mEq total) by mouth daily. 05/09/24   Laurence Locus, DO  pregabalin  (LYRICA ) 25 MG capsule Take 25 mg by mouth 2 (two) times daily. Patient not taking: Reported on 05/23/2024  05/20/24   [provider]  rosuvastatin  (CRESTOR ) 20 MG tablet Take 1 tablet (20 mg total) by mouth daily. 04/28/24   Love, Sharlet RAMAN, PA-C  saccharomyces boulardii (FLORASTOR) 250 MG capsule Take 1 capsule (250 mg total) by mouth 2 (two) times daily. 05/09/24 06/08/24  Laurence Locus, DO  traZODone  (DESYREL ) 50 MG tablet Take 0.5 tablets (25 mg total) by mouth at bedtime. 04/28/24   Love, Sharlet RAMAN, PA-C    Allergies: Hydromorphone  and Sulfa antibiotics    Review of Systems  Constitutional:  Negative for chills and fever.  HENT:  Negative for ear pain and sore throat.   Eyes:  Negative for pain and visual disturbance.  Respiratory:  Negative for cough and shortness of breath.   Cardiovascular:  Negative for chest pain and palpitations.  Gastrointestinal:  Negative for abdominal pain and vomiting.  Genitourinary:  Negative for dysuria and hematuria.  Musculoskeletal:  Negative for arthralgias and back pain.  Skin:  Negative for color change and rash.  Neurological:  Negative for seizures and syncope.  All other systems reviewed and are negative.   Updated Vital Signs BP (!) 146/101 (BP Location: Right Arm)   Pulse 100   Temp (!) 97.4 F (36.3 C) (Oral)   Resp (!) 22   SpO2 98%   Physical Exam Vitals and nursing note reviewed.  Constitutional:      General: She is not in acute distress.    Appearance: She is well-developed.  HENT:     Head: Normocephalic and atraumatic.  Eyes:     Conjunctiva/sclera: Conjunctivae normal.  Cardiovascular:     Rate and Rhythm: Normal rate and regular rhythm.     Heart sounds: No murmur heard. Pulmonary:     Effort: Pulmonary effort is normal. No respiratory distress.     Breath sounds: Normal breath sounds.  Abdominal:     Palpations: Abdomen is soft.     Tenderness: There is no abdominal tenderness.  Musculoskeletal:        General: No swelling.       Arms:     Cervical back: Neck supple.  Skin:    General: Skin is warm and dry.      Capillary Refill: Capillary refill takes less than 2 seconds.  Neurological:     Mental Status: She is alert.     Comments: Small amount of aphasia ( baseline).  Minimal amount of drift in the right lower extremity. ( Baseline )   Psychiatric:        Mood and Affect: Mood normal.     (all labs ordered are listed, but only abnormal results are displayed) Labs Reviewed  CBC WITH DIFFERENTIAL/PLATELET - Abnormal; Notable for the following components:      Result Value   WBC 11.6 (*)    Lymphs Abs 4.2 (*)  Monocytes Absolute 1.1 (*)    All other components within normal limits  COMPREHENSIVE METABOLIC PANEL WITH GFR - Abnormal; Notable for the following components:   CO2 18 (*)    Glucose, Bld 115 (*)    Creatinine, Ser 1.20 (*)    Albumin 3.4 (*)    GFR, Estimated 47 (*)    Anion gap 16 (*)    All other components within normal limits  I-STAT CHEM 8, ED - Abnormal; Notable for the following components:   Creatinine, Ser 1.10 (*)    Glucose, Bld 102 (*)    Calcium , Ion 0.87 (*)    TCO2 19 (*)    Hemoglobin 11.9 (*)    HCT 35.0 (*)    All other components within normal limits  PROTIME-INR  APTT    EKG: EKG Interpretation Date/Time:  Thursday May 27 2024 17:24:12 EDT Ventricular Rate:  106 PR Interval:  160 QRS Duration:  78 QT Interval:  340 QTC Calculation: 451 R Axis:   27  Text Interpretation: Sinus tachycardia Right atrial enlargement Abnormal ECG When compared with ECG of 22-May-2024 16:17, PREVIOUS ECG IS PRESENT Confirmed by Simon Rea [phone]) on 05/27/2024 5:35:26 PM  Radiology: CT ABDOMEN PELVIS WO CONTRAST Result Date: 05/27/2024 EXAM: CT ABDOMEN AND PELVIS WITHOUT CONTRAST 05/27/2024 08:59:50 PM TECHNIQUE: CT of the abdomen and pelvis was performed without the administration of intravenous contrast. Multiplanar reformatted images are provided for review. Automated exposure control, iterative reconstruction, and/or weight-based adjustment of the  mA/kV was utilized to reduce the radiation dose to as low as reasonably achievable. COMPARISON: CT abdomen and pelvis 05/22/2024. CLINICAL HISTORY: Abdominal/flank pain, stone suspected. FINDINGS: LOWER CHEST: No acute abnormality. LIVER: The liver is unremarkable. GALLBLADDER AND BILE DUCTS: Gallbladder is surgically absent. No biliary ductal dilatation. SPLEEN: No acute abnormality. PANCREAS: No acute abnormality. ADRENAL GLANDS: No acute abnormality. KIDNEYS, URETERS AND BLADDER: There is a 2 cm cyst in the right kidney. No stones in the kidneys or ureters. No hydronephrosis. No perinephric or periureteral stranding. Urinary bladder is unremarkable. Per consensus, no follow-up is needed for simple Bosniak type 1 and 2 renal cysts, unless the patient has a malignancy history or risk factors. GI AND BOWEL: Stomach demonstrates no acute abnormality. There is diffuse colonic diverticulosis, severe in the sigmoid colon. Appendix is not visualized. There is no bowel obstruction. PERITONEUM AND RETROPERITONEUM: No ascites. No free air. VASCULATURE: Aorta is normal in caliber. There are atherosclerotic calcifications of the aorta and iliac arteries. LYMPH NODES: No lymphadenopathy. REPRODUCTIVE ORGANS: No acute abnormality. BONES AND SOFT TISSUES: Degenerative changes affect the spine and hips. There is some subcutaneous stranding in the anterior right mid abdominal wall, possibly medication injection site. No acute osseous abnormality. IMPRESSION: 1. No acute findings in the abdomen or pelvis. 2. Right renal simple cyst ); no follow-up imaging recommended. 3. Surgically absent gallbladder. 4. Diffuse colonic diverticulosis. 5. Atherosclerotic calcifications of the abdominal aorta and iliac arteries. 6. Subcutaneous stranding in the anterior right mid abdominal wall, possibly medication injection site. Electronically signed by: Greig Pique MD 05/27/2024 09:40 PM EDT RP Workstation: [id number]   CT Head Wo  Contrast Result Date: 05/27/2024 EXAM: CT HEAD WITHOUT CONTRAST 05/27/2024 08:48:57 PM TECHNIQUE: CT of the head was performed without the administration of intravenous contrast. Automated exposure control, iterative reconstruction, and/or weight based adjustment of the mA/kV was utilized to reduce the radiation dose to as low as reasonably achievable. COMPARISON: CT head 05/23/2024, MRI head 05/23/2024. CLINICAL HISTORY: worsening headache.  Eval for subdural/epidural in setting of possible fall FINDINGS: BRAIN AND VENTRICLES: No acute hemorrhage. No evidence of acute infarct. No hydrocephalus. No extra-axial collection. No mass effect or midline shift. Patchy and confluent areas of decreased attenuation are noted throughout the deep and periventricular white matter of the cerebral hemispheres bilaterally, suggestive of chronic microvascular ischemic changes. Left frontoparietal encephalomalacia. ORBITS: No acute abnormality. SINUSES: No acute abnormality. SOFT TISSUES AND SKULL: No acute soft tissue abnormality. No skull fracture. IMPRESSION: 1. No acute intracranial abnormality. Electronically signed by: Morgane Naveau MD 05/27/2024 08:58 PM EDT RP Workstation: [id number]   EP PPM/ICD IMPLANT Result Date: 05/27/2024 CONCLUSIONS:  1. 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 No early apparent complications. Ozell Prentice Passey, PA-C Cardiac Electrophysiology     Procedures   Medications Ordered in the ED  iohexol  (OMNIPAQUE ) 350 MG/ML injection 75 mL (has no administration in time range)  LORazepam  (ATIVAN ) tablet 0.5 mg (0.5 mg Oral Given 05/27/24 1852)  calcium  carbonate (TUMS - dosed in mg elemental calcium ) chewable tablet 400 mg of elemental calcium  (400 mg of elemental calcium  Oral Given 05/27/24 2339)                                    Medical Decision Making Amount and/or Complexity of Data Reviewed Labs: ordered. Radiology:  ordered.  Risk OTC drugs. Prescription drug management.     HPI:     Patient presents because of right-sided flank pain with abdominal pain as well as some bruising to this area.  Ongoing headache.  Headache has been present for the past week or 2.  Last took Tylenol  earlier today.  No vision changes.  No neurologic changes compared to baseline since her most recent diagnosis of CVA.  Daughter is not appreciating, new weakness or dysarthria or aphasia.  At baseline.  No fever no chills.  No neck pain.  Also endorsing right flank pain.  Daughter noticed that she has some bruising to his right flank area that seems to be getting darker and maybe little bit bigger.  Patient's not sure about her last bowel movement.  Maybe had some nausea this morning but no emesis.  Still passing flatulence.  No chest pain or shortness of breath.  No pleuritic chest pain or hemoptysis.  Patient's been compliant with her medication   Previous medical history reviewed : Patient was discharged yesterday.  Admitted in setting of sepsis.  Presumed secondary to GI source.  Incidental stroke on MRI.  Discharged on 81 mg of aspirin  as well as 75 mg of clopidogrel  for 3 months.    MDM:   Upon exam, patient at neurobaseline.  Patient has some minimal aphasia which is baseline as well as maybe some slight right lower extremity droop which is baseline.  Otherwise, no new neurologic findings on exam.  Normal for daughter.   In terms of bruising, bruising to the right flank area.  Chart reviewed.  Looks like she did receive heparin  shots while in the hospital.  Likely from heparin  shots.  Will obtain laboratory workup as well as coags.  Will obtain with CT scan of the abdomen.  No reported falls but will make sure there is no evidence of any kind of intra-abdominal pathology such as bleed from possible fall.  Will also obtain CT head given cannot rule out fall.   Reevaluation:   Upon  reexamination, patient hemodynamically  stable.  Remains A&O x 3 with GCS 15.   Laboratory workup shows no significant derangements compared to baseline.  Some slight acidosis but nothing concerning carve 18 which is the same as whenever she was last discharged..  Minimal leukocytosis.  Around patient's baseline from whenever she was last discharge.  No anemia.  No thrombocytopenia to explain patient's bruising.  Did obtain CT scan of the patient's abdomen given lack of bowel movements as well as this bruising to the right sided flank.  No evidence of ileus obstruction.  Nonspecific changes in the right flank area consistent from where medication was injected during patient's last hospital stay.  Patient is currently on anticoagulation in terms of antiplatelets are likely contributing to patient's bruising.  No further intervention needed for this at this time.  CT head unremarkable.  No acute pathology.   Remained at neuro baseline.   Hemodynamically stable and clear for discharge.   Follow up with PCP in next week for repeat labs.     EKG Interpreted by Me: nsr    Cardiac Tele Interpreted by Me: Sinus    I have independently interpreted the CXR  and CT  images and agree with the radiologist finding   Social Determinant of Health: None    Disposition and Follow Up: PCP       Final diagnoses:  Bruising  Acute non intractable tension-type headache    ED Discharge Orders     None          Simon Lavonia SAILOR, MD 05/27/24 2348

## 2024-06-03 ENCOUNTER — Other Ambulatory Visit: Payer: Self-pay | Admitting: Allergy & Immunology

## 2024-06-14 ENCOUNTER — Encounter: Payer: Self-pay | Admitting: Cardiology

## 2024-06-14 ENCOUNTER — Ambulatory Visit: Attending: Cardiology | Admitting: Cardiology

## 2024-06-14 VITALS — BP 126/82 | HR 108 | Ht 63.0 in | Wt 130.0 lb

## 2024-06-14 DIAGNOSIS — Z8673 Personal history of transient ischemic attack (TIA), and cerebral infarction without residual deficits: Secondary | ICD-10-CM

## 2024-06-14 DIAGNOSIS — I700 Atherosclerosis of aorta: Secondary | ICD-10-CM | POA: Diagnosis not present

## 2024-06-14 DIAGNOSIS — I10 Essential (primary) hypertension: Secondary | ICD-10-CM

## 2024-06-14 NOTE — Patient Instructions (Signed)
 Follow-Up: At [facility], you and your health needs are our priority.  As part of our continuing mission to provide you with exceptional heart care, our providers are all part of one team.  This team includes your primary Cardiologist (physician) and Advanced Practice Providers or APPs (Physician Assistants and Nurse Practitioners) who all work together to provide you with the care you need, when you need it.  Your next appointment:   3 month(s)  Provider:   Cody Das, MD

## 2024-06-14 NOTE — Progress Notes (Signed)
 Cardiology Office Note:  .   Date:  06/14/2024  ID:  Betty Golden, DOB 09-20-1945, MRN [id number] PCP: Irven Ozell DEL, MD  [facility] Providers Cardiologist:  Newman Lawrence, MD PCP: Irven Ozell DEL, MD  Chief Complaint  Patient presents with   Stroke     Betty Golden is a 78 y.o. female with hypertension, hyperlipidemia, coronary artery disease, stroke, mitral annular calcification, rheumatoid arthritis, depression, GERD  Discussed the use of AI scribe software for clinical note transcription with the patient, who gave verbal consent to proceed.  History of Present Illness Betty Golden is a 78 year old female who presents for evaluation of stroke etiology and management. She is accompanied by her daughter.  She has experienced multiple strokes since August, with imaging showing infarcts in the right parietal white matter and other areas. A loop recorder was implanted three weeks ago to monitor for atrial fibrillation, but no transmissions have been reported. She carries a phone for communication regarding the loop recorder. Echocardiogram and TEE show calcification on the mitral valve without evidence of endocarditis or severe dysfunction. She experiences persistent headaches, memory issues, and speech difficulties following the strokes. Leflunomide  for fibromyalgia is paused due to potential cardiac effects.      Vitals:   06/14/24 1544  BP: 126/82  Pulse: (!) 108  SpO2: 98%      Review of Systems  Cardiovascular:  Negative for chest pain, dyspnea on exertion, leg swelling, palpitations and syncope.  Neurological:        Aphasia        Studies Reviewed: SABRA        EKG 06/14/2024: Sinus tachycardia Possible Anterolateral infarct (cited on or before 14-Jun-2024) When compared with ECG of 27-May-2024 17:24, No significant change was found  TEE 05/25/2024:  1. Left ventricular ejection fraction, by estimation, is 65 to 70%. The   left ventricle has normal function.   2. Right ventricular systolic function is normal. The right ventricular  size is normal.   3. No left atrial/left atrial appendage thrombus was detected. The LAA  emptying velocity was 49 cm/s.   4. There is a small mobile structure on A2 that extends into the atrium,  suspect ruptured chordae or degenerative portion of valve. No bacteremia,  but in different clinical scenario would be concerning for endocarditis.  There is a large focal nodule of  calcification on the posterior leaflet, near P2 and the mitral annulus.  Below the leaflets, there are calcified and thickened chordae with some  mobility on the ventricular aspect of the valve. The mitral valve is  degenerative. Mild mitral valve  regurgitation. No evidence of mitral stenosis. Severe mitral annular  calcification.   5. The aortic valve is tricuspid. There is mild calcification of the  aortic valve. There is mild thickening of the aortic valve. Aortic valve  regurgitation is not visualized. Aortic valve sclerosis/calcification is  present, without any evidence of  aortic stenosis.   6. There is Moderate (Grade III) plaque involving the descending aorta.   7. Agitated saline contrast bubble study was negative, with no evidence  of any interatrial shunt.   8. 3D performed of the mitral valve and demonstrates Focal calcified  nodule on P2/annulus, without mobility. Small mobile structure on A2.  Small focal calcified/mobile structures in subvalvular apparatus.   Conclusion(s)/Recommendation(s): Degenerative mitral valve with  abnormalities described in report. No clear cardiac source of embolism.    Echocardiogram 05/07/2024:  1. Left  ventricular ejection fraction, by estimation, is 70 to 75%. The  left ventricle has hyperdynamic function. The left ventricle has no  regional wall motion abnormalities. Left ventricular diastolic parameters  are consistent with Grade I diastolic   dysfunction (impaired relaxation).   2. Right ventricular systolic function is normal. The right ventricular  size is normal.   3. There is abnormal motion on the ventricular aspect of the valve, near  the severe MAC (see image 52). Unclear if this is subvalvular apparatus or  other structure. Not typical location of vegetation, but etiology unclear.  The mitral valve was not well  visualized. Trivial mitral valve regurgitation. No evidence of mitral  stenosis. Severe mitral annular calcification.   4. The aortic valve is grossly normal. There is mild calcification of the  aortic valve. There is mild thickening of the aortic valve. Aortic valve  regurgitation is not visualized.   Comparison(s): Changes from prior study are noted.   Conclusion(s)/Recommendation(s): See comments re: abnormal motion below  the mitral valve annulus. While this is not typical location for  vegetation, if there is clinical concern for endocarditis, would recommend  TEE for further evaluation.   Labs 04/2024: Chol 90, TG 94, HDL 46, LDL 25 HbA1C 4.7% Hb 11.9 Cr 1.2, eGFR 47 TSH 2.5   Physical Exam Vitals and nursing note reviewed.  Constitutional:      General: She is not in acute distress. Neck:     Vascular: No JVD.  Cardiovascular:     Rate and Rhythm: Normal rate and regular rhythm.     Heart sounds: Normal heart sounds. No murmur heard. Pulmonary:     Effort: Pulmonary effort is normal.     Breath sounds: Normal breath sounds. No wheezing or rales.  Musculoskeletal:     Right lower leg: No edema.     Left lower leg: No edema.  Neurological:     Comments: Partial aphasia      VISIT DIAGNOSES:   ICD-10-CM   1. Essential hypertension  I10 EKG 12-Lead    2. H/O: stroke  Z86.73 EKG 12-Lead    3. Aortic atherosclerosis  I70.0        Betty Golden is a 78 y.o. female with hypertension, hyperlipidemia, coronary artery disease, stroke, mitral annular calcification, rheumatoid  arthritis, depression, GERD Assessment & Plan Cerebral infarction (multiple, recent and subacute) with post-stroke aphasia, memory impairment, and headache: Multiple strokes with recent and subacute infarcts in the right parietal white matter and posterior circulation. Differential diagnosis includes embolization from mitral valve calcification or atrial fibrillation. No evidence of endocarditis. Aortic plaque poses a stroke risk.  That said, her cholesterol is very well-controlled.  Loop recorder has not showed any A-fib so far. -Patient remains of the finding of dense mitral annular calcification as well as leaflet calcification process of stroke risk on.  However, in absence of endocarditis, severe mitral stenosis or regurgitation, I am not convinced that any mitral valve surgery benefits outweigh the risks. - Continue aspirin  and Plavix  until end of January 2026, then switch to aspirin  or Plavix  alone. - Advised emergency room visit for severe symptoms before neurology appointment. - She has upcoming appointment with GI and a neurologist Dr. Onita on 07/27/2024.  I will check with them if any sooner appointment is available. - Will discuss valve findings with colleagues and consider surgical consultation if indicated.  Aortic atherosclerosis: Presence of plaque on the aorta, posing a risk for stroke. Cholesterol levels are low, reducing further  risk. - Continue current management as cholesterol levels are low.  Fibromyalgia: Management complicated by potential side effects of leflunomide , including hypertension, chest pain, palpitations, and rare pulmonary hypertension. No absolute contraindication identified. - Continue current medications with close monitoring for side effects.     F/u in 3 months  Signed, Newman JINNY Lawrence, MD

## 2024-06-16 ENCOUNTER — Emergency Department (HOSPITAL_COMMUNITY)

## 2024-06-16 ENCOUNTER — Encounter (HOSPITAL_COMMUNITY): Payer: Self-pay | Admitting: Internal Medicine

## 2024-06-16 ENCOUNTER — Observation Stay (HOSPITAL_COMMUNITY)

## 2024-06-16 ENCOUNTER — Inpatient Hospital Stay (HOSPITAL_COMMUNITY)
Admission: EM | Admit: 2024-06-16 | Discharge: 2024-06-22 | DRG: 065 | Disposition: A | Discharge status: Rehabilitation Facility | Attending: Family Medicine | Admitting: Family Medicine

## 2024-06-16 ENCOUNTER — Other Ambulatory Visit: Payer: Self-pay

## 2024-06-16 DIAGNOSIS — J4489 Other specified chronic obstructive pulmonary disease: Secondary | ICD-10-CM | POA: Diagnosis present

## 2024-06-16 DIAGNOSIS — K219 Gastro-esophageal reflux disease without esophagitis: Secondary | ICD-10-CM | POA: Diagnosis present

## 2024-06-16 DIAGNOSIS — S0990XA Unspecified injury of head, initial encounter: Secondary | ICD-10-CM | POA: Diagnosis present

## 2024-06-16 DIAGNOSIS — F419 Anxiety disorder, unspecified: Secondary | ICD-10-CM | POA: Diagnosis present

## 2024-06-16 DIAGNOSIS — W010XXA Fall on same level from slipping, tripping and stumbling without subsequent striking against object, initial encounter: Secondary | ICD-10-CM | POA: Diagnosis present

## 2024-06-16 DIAGNOSIS — M79661 Pain in right lower leg: Secondary | ICD-10-CM | POA: Diagnosis present

## 2024-06-16 DIAGNOSIS — E872 Acidosis, unspecified: Secondary | ICD-10-CM | POA: Diagnosis present

## 2024-06-16 DIAGNOSIS — I639 Cerebral infarction, unspecified: Secondary | ICD-10-CM | POA: Diagnosis not present

## 2024-06-16 DIAGNOSIS — Z8673 Personal history of transient ischemic attack (TIA), and cerebral infarction without residual deficits: Secondary | ICD-10-CM

## 2024-06-16 DIAGNOSIS — Z7902 Long term (current) use of antithrombotics/antiplatelets: Secondary | ICD-10-CM

## 2024-06-16 DIAGNOSIS — Z741 Need for assistance with personal care: Secondary | ICD-10-CM | POA: Diagnosis present

## 2024-06-16 DIAGNOSIS — I2510 Atherosclerotic heart disease of native coronary artery without angina pectoris: Secondary | ICD-10-CM | POA: Diagnosis present

## 2024-06-16 DIAGNOSIS — Z833 Family history of diabetes mellitus: Secondary | ICD-10-CM

## 2024-06-16 DIAGNOSIS — E785 Hyperlipidemia, unspecified: Secondary | ICD-10-CM | POA: Diagnosis not present

## 2024-06-16 DIAGNOSIS — R2990 Unspecified symptoms and signs involving the nervous system: Principal | ICD-10-CM

## 2024-06-16 DIAGNOSIS — Z9842 Cataract extraction status, left eye: Secondary | ICD-10-CM

## 2024-06-16 DIAGNOSIS — R29703 NIHSS score 3: Secondary | ICD-10-CM | POA: Diagnosis present

## 2024-06-16 DIAGNOSIS — R569 Unspecified convulsions: Secondary | ICD-10-CM | POA: Diagnosis present

## 2024-06-16 DIAGNOSIS — M797 Fibromyalgia: Secondary | ICD-10-CM | POA: Diagnosis present

## 2024-06-16 DIAGNOSIS — W19XXXD Unspecified fall, subsequent encounter: Secondary | ICD-10-CM

## 2024-06-16 DIAGNOSIS — Z882 Allergy status to sulfonamides status: Secondary | ICD-10-CM

## 2024-06-16 DIAGNOSIS — M4802 Spinal stenosis, cervical region: Secondary | ICD-10-CM | POA: Diagnosis present

## 2024-06-16 DIAGNOSIS — H9190 Unspecified hearing loss, unspecified ear: Secondary | ICD-10-CM | POA: Diagnosis present

## 2024-06-16 DIAGNOSIS — I10 Essential (primary) hypertension: Secondary | ICD-10-CM | POA: Diagnosis present

## 2024-06-16 DIAGNOSIS — R630 Anorexia: Secondary | ICD-10-CM | POA: Diagnosis present

## 2024-06-16 DIAGNOSIS — Z825 Family history of asthma and other chronic lower respiratory diseases: Secondary | ICD-10-CM

## 2024-06-16 DIAGNOSIS — Z9841 Cataract extraction status, right eye: Secondary | ICD-10-CM

## 2024-06-16 DIAGNOSIS — J439 Emphysema, unspecified: Secondary | ICD-10-CM | POA: Diagnosis present

## 2024-06-16 DIAGNOSIS — K5900 Constipation, unspecified: Secondary | ICD-10-CM | POA: Diagnosis present

## 2024-06-16 DIAGNOSIS — I6602 Occlusion and stenosis of left middle cerebral artery: Secondary | ICD-10-CM | POA: Insufficient documentation

## 2024-06-16 DIAGNOSIS — R296 Repeated falls: Secondary | ICD-10-CM | POA: Diagnosis present

## 2024-06-16 DIAGNOSIS — F32A Depression, unspecified: Secondary | ICD-10-CM | POA: Diagnosis present

## 2024-06-16 DIAGNOSIS — M069 Rheumatoid arthritis, unspecified: Secondary | ICD-10-CM | POA: Diagnosis present

## 2024-06-16 DIAGNOSIS — R5381 Other malaise: Secondary | ICD-10-CM | POA: Diagnosis present

## 2024-06-16 DIAGNOSIS — I6389 Other cerebral infarction: Secondary | ICD-10-CM | POA: Diagnosis not present

## 2024-06-16 DIAGNOSIS — I69320 Aphasia following cerebral infarction: Secondary | ICD-10-CM | POA: Diagnosis not present

## 2024-06-16 DIAGNOSIS — Z79899 Other long term (current) drug therapy: Secondary | ICD-10-CM

## 2024-06-16 DIAGNOSIS — I110 Hypertensive heart disease with heart failure: Secondary | ICD-10-CM | POA: Diagnosis present

## 2024-06-16 DIAGNOSIS — Z823 Family history of stroke: Secondary | ICD-10-CM

## 2024-06-16 DIAGNOSIS — Z9049 Acquired absence of other specified parts of digestive tract: Secondary | ICD-10-CM

## 2024-06-16 DIAGNOSIS — R7989 Other specified abnormal findings of blood chemistry: Secondary | ICD-10-CM

## 2024-06-16 DIAGNOSIS — Z8249 Family history of ischemic heart disease and other diseases of the circulatory system: Secondary | ICD-10-CM

## 2024-06-16 DIAGNOSIS — Z961 Presence of intraocular lens: Secondary | ICD-10-CM | POA: Diagnosis present

## 2024-06-16 DIAGNOSIS — J449 Chronic obstructive pulmonary disease, unspecified: Secondary | ICD-10-CM | POA: Diagnosis present

## 2024-06-16 DIAGNOSIS — Y92009 Unspecified place in unspecified non-institutional (private) residence as the place of occurrence of the external cause: Secondary | ICD-10-CM

## 2024-06-16 DIAGNOSIS — Z7982 Long term (current) use of aspirin: Secondary | ICD-10-CM

## 2024-06-16 DIAGNOSIS — J410 Simple chronic bronchitis: Secondary | ICD-10-CM

## 2024-06-16 DIAGNOSIS — R29810 Facial weakness: Secondary | ICD-10-CM | POA: Diagnosis present

## 2024-06-16 DIAGNOSIS — K760 Fatty (change of) liver, not elsewhere classified: Secondary | ICD-10-CM | POA: Diagnosis present

## 2024-06-16 DIAGNOSIS — K589 Irritable bowel syndrome without diarrhea: Secondary | ICD-10-CM | POA: Insufficient documentation

## 2024-06-16 DIAGNOSIS — Z8619 Personal history of other infectious and parasitic diseases: Secondary | ICD-10-CM

## 2024-06-16 DIAGNOSIS — Z87891 Personal history of nicotine dependence: Secondary | ICD-10-CM

## 2024-06-16 DIAGNOSIS — Z7409 Other reduced mobility: Secondary | ICD-10-CM | POA: Diagnosis present

## 2024-06-16 DIAGNOSIS — R000 Tachycardia, unspecified: Secondary | ICD-10-CM | POA: Diagnosis present

## 2024-06-16 DIAGNOSIS — K5730 Diverticulosis of large intestine without perforation or abscess without bleeding: Secondary | ICD-10-CM | POA: Diagnosis present

## 2024-06-16 DIAGNOSIS — R4701 Aphasia: Secondary | ICD-10-CM | POA: Diagnosis not present

## 2024-06-16 DIAGNOSIS — Z79890 Hormone replacement therapy (postmenopausal): Secondary | ICD-10-CM

## 2024-06-16 DIAGNOSIS — D72829 Elevated white blood cell count, unspecified: Secondary | ICD-10-CM | POA: Diagnosis present

## 2024-06-16 DIAGNOSIS — I509 Heart failure, unspecified: Secondary | ICD-10-CM | POA: Diagnosis present

## 2024-06-16 DIAGNOSIS — K143 Hypertrophy of tongue papillae: Secondary | ICD-10-CM | POA: Diagnosis present

## 2024-06-16 DIAGNOSIS — D649 Anemia, unspecified: Secondary | ICD-10-CM | POA: Diagnosis present

## 2024-06-16 LAB — RAPID URINE DRUG SCREEN, HOSP PERFORMED
Amphetamines: NOT DETECTED
Barbiturates: NOT DETECTED
Benzodiazepines: NOT DETECTED
Cocaine: NOT DETECTED
Opiates: POSITIVE — AB
Tetrahydrocannabinol: NOT DETECTED

## 2024-06-16 LAB — LACTIC ACID, PLASMA: Lactic Acid, Venous: 1.7 mmol/L (ref 0.5–1.9)

## 2024-06-16 LAB — COMPREHENSIVE METABOLIC PANEL WITH GFR
ALT: 17 U/L (ref 0–44)
AST: 28 U/L (ref 15–41)
Albumin: 3.8 g/dL (ref 3.5–5.0)
Alkaline Phosphatase: 61 U/L (ref 38–126)
Anion gap: 19 — ABNORMAL HIGH (ref 5–15)
BUN: 13 mg/dL (ref 8–23)
CO2: 17 mmol/L — ABNORMAL LOW (ref 22–32)
Calcium: 10.2 mg/dL (ref 8.9–10.3)
Chloride: 104 mmol/L (ref 98–111)
Creatinine, Ser: 1.12 mg/dL — ABNORMAL HIGH (ref 0.44–1.00)
GFR, Estimated: 51 mL/min — ABNORMAL LOW (ref >60)
Glucose, Bld: 126 mg/dL — ABNORMAL HIGH (ref 70–99)
Potassium: 3.6 mmol/L (ref 3.5–5.1)
Sodium: 140 mmol/L (ref 135–145)
Total Bilirubin: 0.6 mg/dL (ref 0.0–1.2)
Total Protein: 7.6 g/dL (ref 6.5–8.1)

## 2024-06-16 LAB — APTT: aPTT: 27 s (ref 24–36)

## 2024-06-16 LAB — URINALYSIS, ROUTINE W REFLEX MICROSCOPIC
Bilirubin Urine: NEGATIVE
Glucose, UA: NEGATIVE mg/dL
Hgb urine dipstick: NEGATIVE
Ketones, ur: NEGATIVE mg/dL
Nitrite: NEGATIVE
Protein, ur: 30 mg/dL — AB
RBC / HPF: >50 RBC/hpf (ref 0–5)
Specific Gravity, Urine: >1.046 — ABNORMAL HIGH (ref 1.005–1.030)
WBC, UA: >50 WBC/hpf (ref 0–5)
pH: 6 (ref 5.0–8.0)

## 2024-06-16 LAB — CBC WITH DIFFERENTIAL/PLATELET
Abs Immature Granulocytes: 0.1 K/uL — ABNORMAL HIGH (ref 0.00–0.07)
Basophils Absolute: 0.1 K/uL (ref 0.0–0.1)
Basophils Relative: 1 %
Eosinophils Absolute: 0 K/uL (ref 0.0–0.5)
Eosinophils Relative: 0 %
HCT: 39.2 % (ref 36.0–46.0)
Hemoglobin: 12.8 g/dL (ref 12.0–15.0)
Immature Granulocytes: 1 %
Lymphocytes Relative: 25 %
Lymphs Abs: 4.5 K/uL — ABNORMAL HIGH (ref 0.7–4.0)
MCH: 28.3 pg (ref 26.0–34.0)
MCHC: 32.7 g/dL (ref 30.0–36.0)
MCV: 86.7 fL (ref 80.0–100.0)
Monocytes Absolute: 1 K/uL (ref 0.1–1.0)
Monocytes Relative: 6 %
Neutro Abs: 12.5 K/uL — ABNORMAL HIGH (ref 1.7–7.7)
Neutrophils Relative %: 67 %
Platelets: 194 K/uL (ref 150–400)
RBC: 4.52 MIL/uL (ref 3.87–5.11)
RDW: 14.9 % (ref 11.5–15.5)
WBC: 18.1 K/uL — ABNORMAL HIGH (ref 4.0–10.5)
nRBC: 0 % (ref 0.0–0.2)

## 2024-06-16 LAB — I-STAT CHEM 8, ED
BUN: 14 mg/dL (ref 8–23)
Calcium, Ion: 0.99 mmol/L — ABNORMAL LOW (ref 1.15–1.40)
Chloride: 109 mmol/L (ref 98–111)
Creatinine, Ser: 0.9 mg/dL (ref 0.44–1.00)
Glucose, Bld: 99 mg/dL (ref 70–99)
HCT: 40 % (ref 36.0–46.0)
Hemoglobin: 13.6 g/dL (ref 12.0–15.0)
Potassium: 3.5 mmol/L (ref 3.5–5.1)
Sodium: 141 mmol/L (ref 135–145)
TCO2: 19 mmol/L — ABNORMAL LOW (ref 22–32)

## 2024-06-16 LAB — CBG MONITORING, ED: Glucose-Capillary: 128 mg/dL — ABNORMAL HIGH (ref 70–99)

## 2024-06-16 LAB — PROTIME-INR
INR: 0.9 (ref 0.8–1.2)
Prothrombin Time: 12.8 s (ref 11.4–15.2)

## 2024-06-16 LAB — I-STAT CG4 LACTIC ACID, ED: Lactic Acid, Venous: 4.3 mmol/L (ref 0.5–1.9)

## 2024-06-16 LAB — ETHANOL: Alcohol, Ethyl (B): <15 mg/dL (ref <15)

## 2024-06-16 MED ORDER — MELATONIN 5 MG PO TABS
5.0000 mg | ORAL_TABLET | Freq: Every evening | ORAL | Status: DC | PRN
  Administered 2024-06-17 – 2024-06-21 (×5): 5 mg via ORAL
  Filled 2024-06-16 (×5): qty 1

## 2024-06-16 MED ORDER — ALBUTEROL SULFATE HFA 108 (90 BASE) MCG/ACT IN AERS: 2.0000 | INHALATION_SPRAY | RESPIRATORY_TRACT | Status: DC | PRN

## 2024-06-16 MED ORDER — TRAZODONE HCL 50 MG PO TABS
25.0000 mg | ORAL_TABLET | Freq: Every day | ORAL | Status: DC
Start: 2024-06-16 — End: 2024-06-22
  Administered 2024-06-16 – 2024-06-21 (×6): 25 mg via ORAL
  Filled 2024-06-16 (×6): qty 1

## 2024-06-16 MED ORDER — LACTATED RINGERS IV BOLUS
500.0000 mL | Freq: Once | INTRAVENOUS | Status: AC
  Administered 2024-06-16: 500 mL via INTRAVENOUS

## 2024-06-16 MED ORDER — IOHEXOL 350 MG/ML SOLN
75.0000 mL | Freq: Once | INTRAVENOUS | Status: AC | PRN
Start: 2024-06-16 — End: 2024-06-16
  Administered 2024-06-16: 75 mL via INTRAVENOUS

## 2024-06-16 MED ORDER — CLOPIDOGREL BISULFATE 75 MG PO TABS
75.0000 mg | ORAL_TABLET | Freq: Every day | ORAL | Status: DC
  Administered 2024-06-17 – 2024-06-22 (×6): 75 mg via ORAL
  Filled 2024-06-16 (×6): qty 1

## 2024-06-16 MED ORDER — LACTATED RINGERS IV SOLN: INTRAVENOUS | Status: AC

## 2024-06-16 MED ORDER — ACETAMINOPHEN 325 MG PO TABS
650.0000 mg | ORAL_TABLET | ORAL | Status: DC | PRN
  Administered 2024-06-16 – 2024-06-22 (×7): 650 mg via ORAL
  Filled 2024-06-16 (×7): qty 2

## 2024-06-16 MED ORDER — ROSUVASTATIN CALCIUM 20 MG PO TABS
20.0000 mg | ORAL_TABLET | Freq: Every day | ORAL | Status: DC
  Administered 2024-06-16 – 2024-06-22 (×7): 20 mg via ORAL
  Filled 2024-06-16 (×7): qty 1

## 2024-06-16 MED ORDER — LEVETIRACETAM 500 MG PO TABS
500.0000 mg | ORAL_TABLET | Freq: Two times a day (BID) | ORAL | Status: DC
  Administered 2024-06-17 – 2024-06-22 (×11): 500 mg via ORAL
  Filled 2024-06-16 (×11): qty 1

## 2024-06-16 MED ORDER — ALBUTEROL SULFATE (2.5 MG/3ML) 0.083% IN NEBU: 2.5000 mg | INHALATION_SOLUTION | RESPIRATORY_TRACT | Status: DC | PRN

## 2024-06-16 MED ORDER — ACETAMINOPHEN 160 MG/5ML PO SOLN: 650.0000 mg | ORAL | Status: DC | PRN

## 2024-06-16 MED ORDER — STROKE: EARLY STAGES OF RECOVERY BOOK
Freq: Once | Status: AC
  Filled 2024-06-16: qty 1

## 2024-06-16 MED ORDER — CLONAZEPAM 0.125 MG PO TBDP
0.2500 mg | ORAL_TABLET | Freq: Every day | ORAL | Status: DC | PRN
  Administered 2024-06-16 – 2024-06-21 (×4): 0.25 mg via ORAL
  Filled 2024-06-16 (×4): qty 2

## 2024-06-16 MED ORDER — ONDANSETRON HCL 4 MG/2ML IJ SOLN
4.0000 mg | Freq: Once | INTRAMUSCULAR | Status: AC
  Administered 2024-06-16: 4 mg via INTRAVENOUS
  Filled 2024-06-16: qty 2

## 2024-06-16 MED ORDER — ACETAMINOPHEN 650 MG RE SUPP: 650.0000 mg | RECTAL | Status: DC | PRN

## 2024-06-16 MED ORDER — LEVETIRACETAM (KEPPRA) 500 MG/5 ML ADULT IV PUSH
1500.0000 mg | Freq: Once | INTRAVENOUS | Status: AC
  Administered 2024-06-16: 1500 mg via INTRAVENOUS
  Filled 2024-06-16: qty 15

## 2024-06-16 MED ORDER — ASPIRIN 81 MG PO TBEC
81.0000 mg | DELAYED_RELEASE_TABLET | Freq: Every day | ORAL | Status: DC
  Administered 2024-06-17 – 2024-06-22 (×6): 81 mg via ORAL
  Filled 2024-06-16 (×6): qty 1

## 2024-06-16 MED ORDER — MORPHINE SULFATE (PF) 2 MG/ML IV SOLN
2.0000 mg | Freq: Once | INTRAVENOUS | Status: AC
  Administered 2024-06-16: 2 mg via INTRAVENOUS
  Filled 2024-06-16: qty 1

## 2024-06-16 MED ORDER — DULOXETINE HCL 30 MG PO CPEP
30.0000 mg | ORAL_CAPSULE | Freq: Two times a day (BID) | ORAL | Status: DC
  Administered 2024-06-16 – 2024-06-22 (×12): 30 mg via ORAL
  Filled 2024-06-16 (×12): qty 1

## 2024-06-16 MED ORDER — PANTOPRAZOLE SODIUM 40 MG PO TBEC
40.0000 mg | DELAYED_RELEASE_TABLET | Freq: Every day | ORAL | Status: DC
  Administered 2024-06-17 – 2024-06-22 (×6): 40 mg via ORAL
  Filled 2024-06-16 (×6): qty 1

## 2024-06-16 MED ORDER — SENNOSIDES-DOCUSATE SODIUM 8.6-50 MG PO TABS
1.0000 | ORAL_TABLET | Freq: Every evening | ORAL | Status: DC | PRN
  Administered 2024-06-19 – 2024-06-20 (×2): 1 via ORAL
  Filled 2024-06-16 (×3): qty 1

## 2024-06-16 MED ORDER — ENOXAPARIN SODIUM 40 MG/0.4ML IJ SOSY
40.0000 mg | PREFILLED_SYRINGE | INTRAMUSCULAR | Status: DC
  Administered 2024-06-16 – 2024-06-21 (×6): 40 mg via SUBCUTANEOUS
  Filled 2024-06-16 (×6): qty 0.4

## 2024-06-16 MED ORDER — SODIUM CHLORIDE 0.9 % IV SOLN: INTRAVENOUS | Status: DC

## 2024-06-16 MED ORDER — LEFLUNOMIDE 10 MG PO TABS
20.0000 mg | ORAL_TABLET | Freq: Every day | ORAL | Status: DC
  Administered 2024-06-17 – 2024-06-22 (×6): 20 mg via ORAL
  Filled 2024-06-16 (×7): qty 2

## 2024-06-16 MED ORDER — LORAZEPAM 2 MG/ML IJ SOLN
1.0000 mg | Freq: Once | INTRAMUSCULAR | Status: AC
  Administered 2024-06-16: 1 mg via INTRAVENOUS
  Filled 2024-06-16: qty 1

## 2024-06-16 MED ORDER — BUDESON-GLYCOPYRROL-FORMOTEROL 160-9-4.8 MCG/ACT IN AERO
2.0000 | INHALATION_SPRAY | Freq: Two times a day (BID) | RESPIRATORY_TRACT | Status: DC
  Administered 2024-06-18 – 2024-06-22 (×9): 2 via RESPIRATORY_TRACT
  Filled 2024-06-16: qty 5.9

## 2024-06-16 NOTE — ED Provider Notes (Signed)
 [place] EMERGENCY DEPARTMENT AT [facility] Provider Note   CSN: [id number] Arrival date & time: 06/16/24  1032  An emergency department physician performed an initial assessment on this suspected stroke patient at 1250.  Patient presents with: Fall and Code Stroke   Betty Golden is a 78 y.o. female.   This is a 78 year old female presenting emergency department after a fall at home last night.  Mechanical in nature.  Not on blood thinner.  Family notes may be slightly more confused, does have underlying word finding difficulty/aphasia that is nearly at baseline may be slightly worsened.  Last known normal was last night before bed around 7:00pm.  Patient complaining of pain across her lower abdomen diffusely across back.  Is also having ankle pain   Fall       Prior to Admission medications   Medication Sig Start Date End Date Taking? Authorizing Provider  acetaminophen  (TYLENOL ) 325 MG tablet Take 1-2 tablets (325-650 mg total) by mouth every 4 (four) hours as needed for mild pain (pain score 1-3). Patient taking differently: Take 650 mg by mouth daily as needed for mild pain (pain score 1-3) or moderate pain (pain score 4-6). 04/19/24   Love, Sharlet RAMAN, PA-C  albuterol  (VENTOLIN  HFA) 108 (90 Base) MCG/ACT inhaler INHALE 2 PUFFS INTO THE LUNGS EVERY 4 HOURS AS NEEDED FOR WHEEZING OR SHORTNESS OF BREATH 06/03/24   Iva Marty Saltness, MD  amLODipine  (NORVASC ) 10 MG tablet Take 1 tablet (10 mg total) by mouth daily. 04/28/24   Love, Sharlet RAMAN, PA-C  aspirin  EC 81 MG tablet Take 1 tablet (81 mg total) by mouth daily. Swallow whole. 05/27/24   Dibia, Pauline E, MD  benralizumab  (FASENRA ) 30 MG/ML prefilled syringe Inject 1 mL (30 mg total) into the skin every 8 (eight) weeks. Patient not taking: Reported on 06/14/2024 07/31/23   Iva Marty Saltness, MD  Certolizumab Pegol (CIMZIA [place]) Inject 1 Dose into the skin every 14 (fourteen) days. Patient not taking: Reported on  06/14/2024    [provider]  clindamycin (CLEOCIN T) 1 % lotion Apply 1 Application topically 2 (two) times daily.    [provider]  clonazePAM (KLONOPIN) 0.25 MG disintegrating tablet Take 0.25 mg by mouth. 06/01/24   [provider]  clopidogrel  (PLAVIX ) 75 MG tablet Take 1 tablet (75 mg total) by mouth daily. 04/28/24 07/27/24  Love, Sharlet RAMAN, PA-C  DULoxetine  (CYMBALTA ) 30 MG capsule Take 1 capsule (30 mg total) by mouth 2 (two) times daily. 04/28/24   Love, Sharlet RAMAN, PA-C  ferrous sulfate 325 (65 FE) MG EC tablet Take 325 mg by mouth daily.    [provider]  Fluticasone -Umeclidin-Vilant (TRELEGY ELLIPTA ) 200-62.5-25 MCG/ACT AEPB Inhale 1 puff into the lungs in the morning. Patient taking differently: Inhale 2 puffs into the lungs in the morning. 10/15/23   Iva Marty Saltness, MD  leflunomide  (ARAVA ) 20 MG tablet Take 20 mg by mouth daily.    [provider]  melatonin 5 MG TABS Take 1 tablet (5 mg total) by mouth at bedtime. 04/28/24   Love, Sharlet RAMAN, PA-C  metoprolol  succinate (TOPROL  XL) 50 MG 24 hr tablet Take 1 tablet (50 mg total) by mouth daily. 05/09/24 06/14/24  Laurence Locus, DO  pantoprazole  (PROTONIX ) 40 MG tablet Take 1 tablet (40 mg total) by mouth daily. 04/28/24   Love, Sharlet RAMAN, PA-C  potassium chloride  (KLOR-CON  M) 10 MEQ tablet Take 1 tablet (10 mEq total) by mouth daily. 05/09/24  Laurence Locus, DO  pregabalin  (LYRICA ) 25 MG capsule Take 25 mg by mouth 2 (two) times daily. 05/20/24   [provider]  rosuvastatin  (CRESTOR ) 20 MG tablet Take 1 tablet (20 mg total) by mouth daily. 04/28/24   Love, Sharlet RAMAN, PA-C  traZODone  (DESYREL ) 50 MG tablet Take 0.5 tablets (25 mg total) by mouth at bedtime. 04/28/24   Love, Sharlet RAMAN, PA-C    Allergies: Hydromorphone  and Sulfa antibiotics    Review of Systems  Updated Vital Signs BP (!) 145/85   Pulse (!) 116   Temp 97.6 F (36.4 C) (Oral)   Resp (!) 22   Ht 5' 3 (1.6 m)   Wt  59 kg   SpO2 95%   BMI 23.03 kg/m   Physical Exam Vitals and nursing note reviewed.  Constitutional:      General: She is not in acute distress.    Appearance: She is not toxic-appearing.  HENT:     Head: Atraumatic.     Nose: Nose normal.     Mouth/Throat:     Mouth: Mucous membranes are moist.  Eyes:     Conjunctiva/sclera: Conjunctivae normal.  Cardiovascular:     Rate and Rhythm: Normal rate and regular rhythm.  Pulmonary:     Effort: Pulmonary effort is normal.     Breath sounds: Normal breath sounds.  Abdominal:     General: Abdomen is flat. There is no distension.     Tenderness: There is abdominal tenderness. There is no guarding or rebound.  Musculoskeletal:     Comments: No midline spinal tenderness.  Some bruising to her left ankle.  5 out of 5 plantarflexion dorsiflexion, equal grip strength.  Neurological:     Mental Status: She is alert.     Comments: Some word finding difficulty.  Clear speech, no focal motor deficits.  Coordinated movement in all extremities.  Cranial nerves intact     (all labs ordered are listed, but only abnormal results are displayed) Labs Reviewed  COMPREHENSIVE METABOLIC PANEL WITH GFR - Abnormal; Notable for the following components:      Result Value   CO2 17 (*)    Glucose, Bld 126 (*)    Creatinine, Ser 1.12 (*)    GFR, Estimated 51 (*)    Anion gap 19 (*)    All other components within normal limits  CBC WITH DIFFERENTIAL/PLATELET - Abnormal; Notable for the following components:   WBC 18.1 (*)    Neutro Abs 12.5 (*)    Lymphs Abs 4.5 (*)    Abs Immature Granulocytes 0.10 (*)    All other components within normal limits  I-STAT CHEM 8, ED - Abnormal; Notable for the following components:   Calcium , Ion 0.99 (*)    TCO2 19 (*)    All other components within normal limits  I-STAT CG4 LACTIC ACID, ED - Abnormal; Notable for the following components:   Lactic Acid, Venous 4.3 (*)    All other components within normal  limits  CBG MONITORING, ED - Abnormal; Notable for the following components:   Glucose-Capillary 128 (*)    All other components within normal limits  PROTIME-INR  APTT  ETHANOL  CBC  URINALYSIS, ROUTINE W REFLEX MICROSCOPIC  RAPID URINE DRUG SCREEN, HOSP PERFORMED    EKG: None  Radiology: MR BRAIN WO CONTRAST Result Date: 06/16/2024 EXAM: MRI BRAIN WITHOUT CONTRAST 06/16/2024 02:43:21 PM TECHNIQUE: Multiplanar multisequence MRI of the head/brain was performed without the administration of intravenous contrast. COMPARISON:  Head CT 06/16/2024 and MRI 05/23/2024. CLINICAL HISTORY: Stroke, follow up. Recent fall. Aphasia. History of stroke. FINDINGS: BRAIN AND VENTRICLES: A punctate focus of restricted diffusion involving cortex in the right precentral gyrus is new from the prior MRI and consistent with an acute infarct. The cortical and subcortical infarction in the left frontoparietal region has further evolved from the prior MRI with progressive encephalomalacia, and a smaller late subacute to chronic infarct is again noted in the right periatrial white matter. Restricted diffusion associated with the subcentimeter acute left cerebellar infarct on the prior MRI has resolved. T2 hyperintensities elsewhere in the cerebral white matter bilaterally and in the pons are unchanged and nonspecific but compatible with mild chronic small vessel ischemic disease. Chronic lacunar infarcts are noted in the basal ganglia bilaterally. No intracranial hemorrhage, mass, midline shift, hydrocephalus, or extra axial fluid collection is identified. There is mild cerebral atrophy. Major intracranial vascular flow voids are preserved. ORBITS: Bilateral cataract extraction. SINUSES AND MASTOIDS: Small right mastoid effusion. Clear paranasal sinuses. BONES AND SOFT TISSUES: Normal marrow signal. No acute soft tissue abnormality. IMPRESSION: 1. Punctate acute infarct in the right precentral gyrus. 2. Chronic ischemia with  multiple old infarcts as above, including evolving late subacute to chronic bilateral MCA infarcts. Electronically signed by: Dasie Hamburg MD 06/16/2024 03:21 PM EST RP Workstation: [id number]   DG Ankle 2 Views Left Result Date: 06/16/2024 EXAM: 2 VIEW(S) XRAY OF THE LEFT ANKLE 06/16/2024 01:51:00 PM CLINICAL HISTORY: trauma COMPARISON: None available. FINDINGS: BONES AND JOINTS: No acute fracture. No focal osseous lesion. No joint dislocation. SOFT TISSUES: The soft tissues are unremarkable. IMPRESSION: 1. No acute osseous abnormality. Electronically signed by: Lynwood Seip MD 06/16/2024 02:16 PM EST RP Workstation: [id number]   DG Chest Port 1 View Result Date: 06/16/2024 EXAM: 1 VIEW(S) XRAY OF THE CHEST 06/16/2024 01:51:00 PM COMPARISON: 05/22/2024 CLINICAL HISTORY: Trauma FINDINGS: LUNGS AND PLEURA: Stable left basilar scarring. No pleural effusion. No pneumothorax. HEART AND MEDIASTINUM: No acute abnormality of the cardiac and mediastinal silhouettes. BONES AND SOFT TISSUES: No acute osseous abnormality. IMPRESSION: 1. No acute cardiopulmonary process. Electronically signed by: Lynwood Seip MD 06/16/2024 02:14 PM EST RP Workstation: [id number]   CT CHEST ABDOMEN PELVIS W CONTRAST Result Date: 06/16/2024 CLINICAL DATA:  Syncope with fall hitting back of head and neck. Abdominal pain. EXAM: CT CHEST, ABDOMEN, AND PELVIS WITH CONTRAST TECHNIQUE: Multidetector CT imaging of the chest, abdomen and pelvis was performed following the standard protocol during bolus administration of intravenous contrast. RADIATION DOSE REDUCTION: This exam was performed according to the departmental dose-optimization program which includes automated exposure control, adjustment of the mA and/or kV according to patient size and/or use of iterative reconstruction technique. CONTRAST:  75mL OMNIPAQUE  IOHEXOL  350 MG/ML SOLN COMPARISON:  CTA chest 05/22/2024 and CT abdomen/pelvis 05/27/2024, CT chest/abdomen/pelvis 09/25/2021  FINDINGS: CT CHEST FINDINGS Cardiovascular: Heart is normal size. Calcification of the mitral valve annulus. Thoracic aorta is normal in caliber without evidence of aneurysm or dissection. Pulmonary arterial system is unremarkable. Remaining vascular structures are normal. Mediastinum/Nodes: No mediastinal or hilar adenopathy. Mediastinal structures are otherwise unremarkable. Lungs/Pleura: Lungs are adequately inflated and demonstrate mild centrilobular emphysematous disease. Stable mild chronic peripheral reticular density over the upper lobes. No acute airspace process or effusion. Airways are normal. 3 mm subpleural nodule over the lateral left lower lobe unchanged from 2023 and therefore considered benign. Musculoskeletal: No acute fracture. CT ABDOMEN PELVIS FINDINGS Hepatobiliary: Prior cholecystectomy. Mild steatosis of the liver without focal mass. Biliary tree is  normal. Pancreas: Normal. Spleen: Normal. Adrenals/Urinary Tract: Adrenal glands are normal. Kidneys are normal in size without hydronephrosis or nephrolithiasis. Right renal cyst unchanged. Bladder and ureters are normal. Stomach/Bowel: Stomach and small bowel are normal. Prior appendectomy. Moderate diverticulosis over the sigmoid colon and distal descending colon. No active inflammation. Mild diverticulosis over the right colon. Vascular/Lymphatic: Minimal calcified plaque over the abdominal aorta which is normal in caliber. Remaining vascular structures are unremarkable. No adenopathy. Reproductive: Uterus and bilateral adnexa are unremarkable. Other: No free fluid or focal inflammatory change. Musculoskeletal: No acute fracture. IMPRESSION: 1. No acute findings in the chest, abdomen or pelvis. 2. Mild centrilobular emphysematous disease. 3. Mild hepatic steatosis. 4. Colonic diverticulosis without active inflammation. 5. Aortic atherosclerosis. Aortic Atherosclerosis (ICD10-I70.0) and Emphysema (ICD10-J43.9). Electronically Signed   By:  Toribio Agreste M.D.   On: 06/16/2024 13:51   CT Cervical Spine Wo Contrast Result Date: 06/16/2024 EXAM: CT CERVICAL SPINE WITH CONTRAST 06/16/2024 01:19:54 PM TECHNIQUE: CT of the cervical spine was performed with the administration of 75 mL of iohexol  (OMNIPAQUE ) 350 MG/ML injection. Multiplanar reformatted images are provided for review. Automated exposure control, iterative reconstruction, and/or weight based adjustment of the mA/kV was utilized to reduce the radiation dose to as low as reasonably achievable. COMPARISON: Cervical spine 09/25/2021. CLINICAL HISTORY: Trauma. Mechanical fall secondary to dizziness. FINDINGS: CERVICAL SPINE: BONES AND ALIGNMENT: No acute fracture or traumatic malalignment. 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 Anterior osteophytes at C5-C6 and C6-C7 are stable. Uncovertebral spurring contributes to moderate left foraminal stenosis at C3-C4, C4-C5, and C5-C6. SOFT TISSUES: No prevertebral soft tissue swelling. Atherosclerotic calcifications are present in the carotid bifurcations bilaterally without significant stenosis. IMPRESSION: 1. No acute cervical spine injury related to the reported trauma. 2. 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 3. Moderate left neural foraminal stenosis at C3-4, C4-5, and C5-6. Electronically signed by: Lonni Necessary MD 06/16/2024 01:33 PM EST RP Workstation: [id number]   CT HEAD CODE STROKE WO CONTRAST (LKW 0-4.5h, LVO 0-24h) Result Date: 06/16/2024 EXAM: CT HEAD WITHOUT 06/16/2024 12:52:32 PM TECHNIQUE: CT of the head was performed without the administration of intravenous contrast. Automated exposure control, iterative reconstruction, and/or weight based adjustment of the mA/kV was utilized to reduce the radiation dose to as low as reasonably achievable. COMPARISON: 05/27/2024 CLINICAL HISTORY: Neuro deficit, acute, stroke suspected FINDINGS: BRAIN AND  VENTRICLES: No acute intracranial hemorrhage. No mass effect or midline shift. No extra-axial fluid collection. No evidence of acute infarct. Chronic left parietal lobe infarct. Scattered hypoattenuating foci in supratentorial white matter, nonspecific but most commonly related to chronic microangiopathic changes. No hydrocephalus. ORBITS: No acute abnormality. SINUSES AND MASTOIDS: No acute abnormality. SOFT TISSUES AND SKULL: No acute skull fracture. No acute soft tissue abnormality. LIMITATIONS/ARTIFACTS: Motion artifact limiting evaluation. IMPRESSION: 1. No acute intracranial abnormality. 2. Chronic left parietal lobe infarct. 3. Motion artifact limiting evaluation. 4. These results were communicated to Dr. Matthews at 1:04 PM on 06/16/2024 by secure text page via the [facility] messaging system. Electronically signed by: Lonni Necessary MD 06/16/2024 01:05 PM EST RP Workstation: [id number]     Procedures   Medications Ordered in the ED  morphine  (PF) 2 MG/ML injection 2 mg (2 mg Intravenous Given 06/16/24 1335)  ondansetron  (ZOFRAN ) injection 4 mg (4 mg Intravenous Given 06/16/24 1338)  iohexol  (OMNIPAQUE ) 350 MG/ML injection 75 mL (75 mLs Intravenous Contrast Given 06/16/24 1323)  LORazepam  (ATIVAN ) injection 1 mg (1 mg Intravenous Given  06/16/24 1355)  lactated ringers  bolus 500 mL (500 mLs Intravenous New Bag/Given 06/16/24 1511)    Clinical Course as of 06/16/24 1627  Wed Jun 16, 2024  1309 Patient was activated as a code stroke after my initial MSE.  Had a episode after blood was drawn where she was staring off and was not responding.  When came to may be slightly worse aphasia that has since improved/resolved.  Discussed with Dr. Matthews, neurology recommending MRI and admission for observation/EEG. [TY]  1320 CT HEAD CODE STROKE WO CONTRAST (LKW 0-4.5h, LVO 0-24h) IMPRESSION: 1. No acute intracranial abnormality. 2. Chronic left parietal lobe infarct. 3. Motion artifact limiting  evaluation. 4. These results were communicated to Dr. Matthews at 1:04 PM on 06/16/2024 by secure text page via the [facility] messaging system.   [TY]  1412 CT CHEST ABDOMEN PELVIS W CONTRAST IMPRESSION: 1. No acute findings in the chest, abdomen or pelvis. 2. Mild centrilobular emphysematous disease. 3. Mild hepatic steatosis. 4. Colonic diverticulosis without active inflammation. 5. Aortic atherosclerosis.  Aortic Atherosclerosis (ICD1   [TY]  1412 IMPRESSION: 1. No acute cervical spine injury related to the reported trauma. 2. 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 3. Moderate left neural foraminal stenosis at C3-4, C4-5, and C5-6.   [TY]  1525 MR BRAIN WO CONTRAST IMPRESSION: 1. Punctate acute infarct in the right precentral gyrus. 2. Chronic ischemia with multiple old infarcts as above, including evolving late subacute to chronic bilateral MCA infarcts.  Electronically signed by: Dasie Hamburg MD 06/16/2024 03:21 PM EST RP Workstation: [id number]   [TY]    Clinical Course User Index [TY] Neysa Caron PARAS, DO                                 Medical Decision Making 78 year old female presenting emergency department after a fall complaining of diffuse pain to back, abdomen.  Was having some word finding difficulty slightly worse than baseline and had an episode where she stared off and was completely aphasic.  Code stroke was activated.  MRI with punctate acute stroke.  Neurology recommending admission for stroke workup.  Does have an elevated lactate unclear etiology/significance does have elevated white count as well, no overt infectious process identified on imaging.  UA is still pending.  No significant metabolic derangements.  Was given morphine  for pain, given Ativan  for MRI.  IV fluids ordered as well.  Discussed case with Dr. Seena with hospitalist who agrees to admit patient.  Amount and/or Complexity of Data Reviewed External Data  Reviewed:     Details: Prior stroke, does appear to have an elevated lactate and leukocytosis from prior admissions. Labs: ordered. Radiology: ordered and independent interpretation performed. Decision-making details documented in ED Course.    Details: Do not appreciate obvious intracranial hemorrhage on CT head.  No free air on CT abdomen. ECG/medicine tests: ordered.  Risk Prescription drug management. Parenteral controlled substances. Decision regarding hospitalization. Diagnosis or treatment significantly limited by social determinants of health.        Final diagnoses:  None    ED Discharge Orders     None          Neysa Caron PARAS, DO 06/16/24 1627

## 2024-06-16 NOTE — Code Documentation (Signed)
 Stroke Response Nurse Documentation Code Documentation  Betty Golden is a 78 y.o. female arriving to [facility]  via [employer] on 06/16/2024 with past medical hx of stroke, HTN, and coronary artery disease. On aspirin  81 mg daily and clopidogrel  75 mg daily. Code stroke was activated by ED.   Patient from home, and came in via private vehicle after a fall at home yesterday evening. Family just wanted to get her checked out since she was endorsing a lot of pain. She was mid-conversation with her family in ED room 10 and was noted to be suddenly mute, not interactive, and 'staring off into space'. From her prior strokes, she had right sided weakness and aphasia at baseline. However, her family endorses she has been increasingly more aphasic today, especially after the event.  Patient to CT with team. NIHSS 3, see documentation for details and code stroke times. Patient with left facial droop, right leg weakness, and Expressive aphasia  on exam.  The following imaging was completed:  CT Head. Patient is not a candidate for IV Thrombolytic due to recent R MCA AIS within 3 months. Patient is not a candidate for IR.  Care Plan: Awaiting MRI, EEG, and lab panels. Q2hr VS/NIHSS.   Process Delays Noted: none  Bedside handoff with ED RN Adelita.    Meyah Corle M Taven [employer]  Stroke Response RN

## 2024-06-16 NOTE — ED Notes (Signed)
 Patient aphasic. PA notified. Charge RN aware. Code stroke called [id number]

## 2024-06-16 NOTE — ED Notes (Signed)
 IV team at bedside

## 2024-06-16 NOTE — Progress Notes (Signed)
 EEG completed. No initial skin breakdown

## 2024-06-16 NOTE — ED Notes (Signed)
 MD made aware of patient tachycardic.

## 2024-06-16 NOTE — ED Provider Triage Note (Signed)
 Emergency Medicine Provider Triage Evaluation Note  ANGELISSA SUPAN , a 78 y.o. female  was evaluated in triage.  Pt complains of fall last night.  Mechanical.  Family at bedside notes that she has not been herself since hospitalized for stroke does have some residual word finding difficulties.  Seemingly worsened this morning.  Complaining of pain to head, neck, down back and across abdomen.  Denying chest pain, but having some shortness of breath.  Significant diffuse abdominal pain.  Last bowel movement yesterday.  She did hit her head last night, denies LOC.  Denies being on blood thinner Review of Systems  Positive: Trauma Negative: Chest pain  Physical Exam  BP 135/84 (BP Location: Left Arm)   Pulse (!) 123   Temp (!) 97.4 F (36.3 C)   Resp (!) 23   Ht 5' 3 (1.6 m)   Wt 59 kg   SpO2 100%   BMI 23.03 kg/m  Gen:   Awake, no distress   Resp:  Normal effort, full lung sounds MSK:   Moves extremities without difficulty.  Tenderness to her left ankle Other:  Exhibiting some word finding difficulties, but otherwise no focal neurodeficits.  Medical Decision Making  Medically screening exam initiated at 11:54 AM.  Appropriate orders placed.  Sendy J Rooke was informed that the remainder of the evaluation will be completed by another provider, this initial triage assessment does not replace that evaluation, and the importance of remaining in the ED until their evaluation is complete.  78 year old presented for evaluation after fall.  She looks uncomfortable, is tachypneic and tachycardic.  Has some abdominal tenderness on exam.  Will get trauma labs and CT imaging.   Neysa Caron JINNY, DO 06/16/24 1159

## 2024-06-16 NOTE — Consult Note (Signed)
 NEUROLOGY CONSULT NOTE   Date of service: June 16, 2024 Patient Name: Betty Golden MRN:  [id number] DOB:  1945/11/17 Chief Complaint: Code stroke  Requesting Provider: Neysa Caron JINNY, DO  History of Present Illness  Betty Golden is a 78 y.o. female with hx of prior stroke with residual expressive aphasia recent admission and discharged on 10/29 on ASA/Plavix  x 3 months, CAD, HTN, HLD, GERD, IBS. RA, anxiety and depression, asthma, chronic headaches who presents to ED for s/p fall last night. Family brought her into the hospital just for evaluation for worsening confusion and worsening slurred speech. While in triage, RN drew blood and family was present at the beside when patient suddenly stopped responding to them and was staring off not responding. Code stroke called by EDP for acute onset of aphasia. CT head with no acute process. Family states she has had 2 falls since the beginning of the month.  NIHSS 3 for facial droop, right leg drift and aphasia  Leukocytosis on CBC and lactic acid 4.3  in ED   LKW: 1230 Modified rankin score: 2-Slight disability-UNABLE to perform all activities but does not need assistance IV Thrombolysis:  No recent stroke  EVT:  No LVO   NIHSS components Score: Comment  1a Level of Conscious 0[]  1[]  2[]  3[]      1b LOC Questions 0[]  1[]  2[]       1c LOC Commands 0[]  1[]  2[]       2 Best Gaze 0[]  1[]  2[]       3 Visual 0[]  1[]  2[]  3[]      4 Facial Palsy 0[]  1[x]  2[]  3[]      5a Motor Arm - left 0[]  1[]  2[]  3[]  4[]  UN[]    5b Motor Arm - Right 0[]  1[]  2[]  3[]  4[]  UN[]    6a Motor Leg - Left 0[]  1[]  2[]  3[]  4[]  UN[]    6b Motor Leg - Right 0[]  1[x]  2[]  3[]  4[]  UN[]    7 Limb Ataxia 0[]  1[]  2[]  UN[]      8 Sensory 0[]  1[]  2[]  UN[]      9 Best Language 0[]  1[x]  2[]  3[]      10 Dysarthria 0[]  1[]  2[]  UN[]      11 Extinct. 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 difficile colitis    Cervical stenosis of spine    Chronic headaches    Chronic neck pain    Colitis due to Clostridium difficile 02/10/2015   Coronary artery disease    Depression    Diverticulosis    Essential hypertension    Fibromyalgia    GERD (gastroesophageal reflux disease)    History of CVA (cerebrovascular accident) 05/06/2024   History of Salmonella gastroenteritis    HNP (herniated nucleus pulposus), lumbar    Hyperlipidemia    IBS (irritable bowel syndrome)    Lung nodule    rheumatoid arthritis     Past Surgical History:  Procedure Laterality Date   APPENDECTOMY  1965   BIOPSY  08/26/2018   Procedure: BIOPSY;  Surgeon: Golda Claudis PENNER, MD;  Location: [facility];  Service: Endoscopy;;  gastric   BREAST BIOPSY     BREAST REDUCTION SURGERY  1994   CATARACT EXTRACTION  W/PHACO Left 06/09/2023   Procedure: CATARACT EXTRACTION PHACO AND INTRAOCULAR LENS PLACEMENT (IOC);  Surgeon: Harrie Agent, MD;  Location: [facility];  Service: Ophthalmology;  Laterality: Left;  CDE 7.03   CATARACT EXTRACTION W/PHACO Right 06/23/2023   Procedure: CATARACT EXTRACTION PHACO AND INTRAOCULAR LENS PLACEMENT (IOC);  Surgeon: Harrie Agent, MD;  Location: [facility];  Service: Ophthalmology;  Laterality: Right;  CDE: 11.55   CHOLECYSTECTOMY  1975   COLONOSCOPY N/A 08/19/2013   Procedure: COLONOSCOPY;  Surgeon: Claudis RAYMOND Rivet, MD;  Location: [facility];  Service: Endoscopy;  Laterality: N/A;  155-moved to 140 Ann to notify pt   COLONOSCOPY N/A 08/26/2018   Procedure: COLONOSCOPY;  Surgeon: Rivet Claudis RAYMOND, MD;  Location: [facility];  Service: Endoscopy;  Laterality: N/A;   ESOPHAGOGASTRODUODENOSCOPY N/A 08/26/2018   Procedure: ESOPHAGOGASTRODUODENOSCOPY (EGD);  Surgeon: Rivet Claudis RAYMOND, MD;  Location: [facility];  Service: Endoscopy;  Laterality:  N/A;   fibromyalgia     LOOP RECORDER INSERTION N/A 05/26/2024   Procedure: LOOP RECORDER INSERTION;  Surgeon: Lesia Ozell Barter, PA-C;  Location: [facility] INVASIVE CV LAB;  Service: Cardiovascular;  Laterality: N/A;   LUMBAR LAMINECTOMY/DECOMPRESSION MICRODISCECTOMY Bilateral 09/20/2015   Procedure: MICRO LUMBAR DECOMPRESSION L5-S1 BILATERAL    (1 LEVEL);  Surgeon: Reyes Billing, MD;  Location: [facility];  Service: Orthopedics;  Laterality: Bilateral;   rheumatoid arthritis     Sinus Surgery     TONSILLECTOMY  1968   TRANSESOPHAGEAL ECHOCARDIOGRAM (CATH LAB) N/A 05/25/2024   Procedure: TRANSESOPHAGEAL ECHOCARDIOGRAM;  Surgeon: Lonni Slain, MD;  Location: [facility] INVASIVE CV LAB;  Service: Cardiovascular;  Laterality: N/A;    Family History: Family History  Problem Relation Age of Onset   Heart disease Mother    Hypertension Mother    Asthma Mother    Heart disease Father    Stroke Father    Hypertension Father    Stroke Sister    Asthma Sister    Hypertension Brother    Asthma Brother    Asthma Maternal Grandmother    Heart disease Maternal Grandmother    Diabetes Maternal Grandfather    Asthma Maternal Aunt    Colon cancer Neg Hx     Social History  reports that she quit smoking about 35 years ago. Her smoking use included cigarettes. She started smoking about 45 years ago. She has a 20 pack-year smoking history. She has never used smokeless tobacco. She reports that she does not drink alcohol and does not use drugs.  Allergies  Allergen Reactions   Hydromorphone  Anaphylaxis   Sulfa Antibiotics Rash    Medications   Current Facility-Administered Medications:    morphine  (PF) 2 MG/ML injection 2 mg, 2 mg, Intravenous, Once, Young, Travis J, DO   ondansetron  (ZOFRAN ) injection 4 mg, 4 mg, Intravenous, Once, Young, Travis J, DO  Current Outpatient Medications:    acetaminophen  (TYLENOL ) 325 MG tablet, Take 1-2 tablets (325-650 mg total) by mouth every 4 (four) hours  as needed for mild pain (pain score 1-3). (Patient taking differently: Take 650 mg by mouth daily as needed for mild pain (pain score 1-3) or moderate pain (pain score 4-6).), Disp: , Rfl:    albuterol  (VENTOLIN  HFA) 108 (90 Base) MCG/ACT inhaler, INHALE 2 PUFFS INTO THE LUNGS EVERY 4 HOURS AS NEEDED FOR WHEEZING OR SHORTNESS OF BREATH, Disp: 8 g, Rfl: 0   amLODipine  (NORVASC ) 10 MG tablet, Take 1 tablet (10 mg total) by mouth daily., Disp: 30 tablet, Rfl: 0  aspirin  EC 81 MG tablet, Take 1 tablet (81 mg total) by mouth daily. Swallow whole., Disp: 30 tablet, Rfl: 12   benralizumab  (FASENRA ) 30 MG/ML prefilled syringe, Inject 1 mL (30 mg total) into the skin every 8 (eight) weeks. (Patient not taking: Reported on 06/14/2024), Disp: 1 mL, Rfl: 6   Certolizumab Pegol (CIMZIA [place]), Inject 1 Dose into the skin every 14 (fourteen) days. (Patient not taking: Reported on 06/14/2024), Disp: , Rfl:    clindamycin (CLEOCIN T) 1 % lotion, Apply 1 Application topically 2 (two) times daily., Disp: , Rfl:    clonazePAM  (KLONOPIN ) 0.25 MG disintegrating tablet, Take 0.25 mg by mouth., Disp: , Rfl:    clopidogrel  (PLAVIX ) 75 MG tablet, Take 1 tablet (75 mg total) by mouth daily., Disp: 90 tablet, Rfl: 0   DULoxetine  (CYMBALTA ) 30 MG capsule, Take 1 capsule (30 mg total) by mouth 2 (two) times daily., Disp: 60 capsule, Rfl: 0   ferrous sulfate  325 (65 FE) MG EC tablet, Take 325 mg by mouth daily., Disp: , Rfl:    Fluticasone -Umeclidin-Vilant (TRELEGY ELLIPTA ) 200-62.5-25 MCG/ACT AEPB, Inhale 1 puff into the lungs in the morning. (Patient taking differently: Inhale 2 puffs into the lungs in the morning.), Disp: 90 each, Rfl: 3   leflunomide  (ARAVA ) 20 MG tablet, Take 20 mg by mouth daily., Disp: , Rfl:    melatonin 5 MG TABS, Take 1 tablet (5 mg total) by mouth at bedtime., Disp: 30 tablet, Rfl: 0   metoprolol  succinate (TOPROL  XL) 50 MG 24 hr tablet, Take 1 tablet (50 mg total) by mouth daily., Disp: 30 tablet, Rfl:  0   pantoprazole  (PROTONIX ) 40 MG tablet, Take 1 tablet (40 mg total) by mouth daily., Disp: 30 tablet, Rfl: 0   potassium chloride  (KLOR-CON  M) 10 MEQ tablet, Take 1 tablet (10 mEq total) by mouth daily., Disp: , Rfl:    pregabalin  (LYRICA ) 25 MG capsule, Take 25 mg by mouth 2 (two) times daily., Disp: , Rfl:    rosuvastatin  (CRESTOR ) 20 MG tablet, Take 1 tablet (20 mg total) by mouth daily., Disp: 30 tablet, Rfl: 0   traZODone  (DESYREL ) 50 MG tablet, Take 0.5 tablets (25 mg total) by mouth at bedtime., Disp: 30 tablet, Rfl: 0  Vitals   Vitals:   06/16/24 1040 06/16/24 1137  BP: 135/84   Pulse: (!) 123   Resp: (!) 23   Temp: (!) 97.4 F (36.3 C)   SpO2: 100%   Weight:  59 kg  Height:  5' 3 (1.6 m)    Body mass index is 23.03 kg/m.   Physical Exam   Constitutional: Appears well-developed and well-nourished.   Psych: Affect appropriate to situation.   Eyes: No scleral injection.   HENT: No OP obstruction.   Head: Normocephalic.   Cardiovascular: Normal rate and regular rhythm.   Respiratory: Effort normal, non-labored breathing.   GI: Soft.  No distension. There is no tenderness.   Skin: WDI.    Neurologic Examination   Mental Status -  Patient awake and alert in NAD. She is oriented to self, month and age, mild aphasia (baseline), no dysarthria   Cranial Nerves II - XII - II - Visual field intact OU . III, IV, VI - Extraocular movements intact . V - Facial sensation intact bilaterally . VII - right facial  VIII - Hearing & vestibular intact bilaterally . X - Palate elevates symmetrically . XI - Chin turning & shoulder shrug intact bilaterally . XII - Tongue protrusion  intact .  Motor Strength - The patient's strength was normal left arm and left leg, right arm normal right leg with drift, also has pain when lifting leg  Bulk was normal and fasciculations were absent .   Motor Tone - Muscle tone was assessed at the neck and appendages and was normal . Sensory -  Light touch, temperature/pinprick were assessed and were symmetrical .   Coordination - The patient had normal movements in the hands and feet with no ataxia or dysmetria.  Tremor was absent .  Gait and Station - deferred.  Labs/Imaging/Neurodiagnostic studies   CBC:  Recent Labs  Lab 07/09/2024 1244  HGB 13.6  HCT 40.0   Basic Metabolic Panel:  Lab Results  Component Value Date   NA 141 2024-07-09   K 3.5 2024/07/09   CO2 18 (L) 05/27/2024   GLUCOSE 99 07/09/2024   BUN 14 Jul 09, 2024   CREATININE 0.90 07/09/24   CALCIUM  9.5 05/27/2024   GFRNONAA 47 (L) 05/27/2024   GFRAA 54 (L) 04/21/2019   Lipid Panel:  Lab Results  Component Value Date   LDLCALC 25 05/24/2024   HgbA1c:  Lab Results  Component Value Date   HGBA1C 4.7 (L) 04/13/2024   Urine Drug Screen:     Component Value Date/Time   LABOPIA POSITIVE (A) 04/12/2024 1407   COCAINSCRNUR NONE DETECTED 04/12/2024 1407   LABBENZ NONE DETECTED 04/12/2024 1407   AMPHETMU NONE DETECTED 04/12/2024 1407   THCU NONE DETECTED 04/12/2024 1407   LABBARB NONE DETECTED 04/12/2024 1407    Alcohol Level     Component Value Date/Time   ETH <15 04/12/2024 1434   INR  Lab Results  Component Value Date   INR 1.0 05/27/2024   APTT  Lab Results  Component Value Date   APTT 24 05/27/2024   AED levels: No results found for: PHENYTOIN, ZONISAMIDE, LAMOTRIGINE, LEVETIRACETA  CT Head without contrast(Personally reviewed):  No acute process    Neurodiagnostics rEEG:  Ordered   ASSESSMENT   JACKELYN ILLINGWORTH is a 78 y.o. female hx of prior stroke with residual expressive aphasia recent admission and discharged on 10/29 on ASA/Plavix  x 3 months, CAD, HTN, HLD, GERD, IBS. RA, anxiety and depression, asthma, chronic headaches who presents to ED for s/p fall last night. Family brought her into the hospital just for evaluation and was at her baseline. While in triage, RN drew blood and family was present at the beside  when patient suddenly stopped responding to them and was staring off not responding   RECOMMENDATIONS  Check routine EEG to evaluate for seizures  MRI brain when able to evaluate for acute stroke  Evaluate for infectious process  PT/OT  Neurology will follow up with above tests and further recommendations after results of above  ______________________________________________________________________    Signed, Karna DELENA Geralds, NP [facility]   Attending Neurohospitalist Addendum Patient seen and examined with APP/Resident. Agree with the history and physical as documented above. Agree with the plan as documented, which I helped formulate. I have edited the note above to reflect my full findings and recommendations. I have independently reviewed the chart, obtained history, review of systems and examined the patient.I have personally reviewed pertinent head/neck/spine imaging (CT/MRI). Please feel free to call with any questions.  MRI brain shows punctate acute infarct R precentral gyrus. Suspect she also had a seizure given semiology of event. Recommend stroke workup and start keppra  maintenance therapy given high risk of recurrent seizure in the setting of  recurrent stroke. Will perform routine EEG tmrw for completeness but I recommend continuing keppra  even if EEG does not show epileptiform abnormalities. Stroke team to follow starting tomorrow.  -- Elida Ross, MD [facility] [phone]  If 7pm- 7am, please page neurology on call as listed in [facility]ION.

## 2024-06-16 NOTE — ED Triage Notes (Addendum)
 Patient arrives POV for fall last night. Patient hit front of her head after falling due to dizziness. Patient hit back of neck and back. Patient endorses pain in abdomen after something fell on it. Patient also endorses leg pain. Hx of strokes. Patient GCS. Speech slurred at baseline after stroke but daughter endorses this speech is worse and confusion is increased. Patient's daughter says fall occurred around 2000 last night.

## 2024-06-16 NOTE — ED Notes (Addendum)
 Young MD notified of patient lactic acid, need for anxiety med for MRI, and patient passing swallow screen.

## 2024-06-16 NOTE — Progress Notes (Signed)
 Patient not available for EEG at the moment. Per nurse patient will be going to MRI within the hour .  Tech will check back as schedule allows.

## 2024-06-16 NOTE — H&P (Addendum)
 History and Physical   Betty Golden FMW:[id number] DOB: Feb 04, 1946 DOA: 06/16/2024  PCP: Irven Ozell DEL, MD   Patient coming from: Home  Chief Complaint: Fall  HPI: Betty Golden is a 78 y.o. female with medical history significant of hypertension, hyperlipidemia, CVA, residual expressive aphasia, GERD, depression, rheumatoid arthritis, asthma, COPD, anemia, spinal stenosis, fibromyalgia presenting after a fall at home.  History obtained with assistance of chart review and family.  Patient reportedly got dizzy and had a fall last night at home.  She did hit the back of her neck and her back.  Noted to have some abdominal pain and leg pain afterwards.  Has history of prior CVA and incidental repeat CVA noted last month.  And has slurred speech/expressive aphasia at baseline.  This had been worse recently with some increased confusion.  While in the ED patient had episode where she stopped talking and began to stare off.  Aphasia has been worse since that as well.  Patient denies fevers, chills, chest pain, shortness of breath, abdominal pain, diarrhea, nausea, vomiting  ED Course: Vital signs in ED notable for heart rate in the 110s-130, blood pressure in the 130s-140 systolic, respiratory rate in 79d.  Lab workup included CMP with bicarb 17, gap 19, creatinine stable 1.12.  CBC with leukocytosis to 18.1.  PT, PTT, INR normal.  Lactic acid elevated to 4.3 with repeat pending.  Urinalysis, ethanol level, UDS pending.  Imaging included ankle x-ray which showed no acute abnormality, chest x-ray which showed no acute abnormality.  CT head showed no acute abnormality.  CT C-spine showed no acute abnormality.  CT chest abdomen pelvis showed no acute abnormality but showed chronic emphysema, hepatic steatosis, diverticulosis without diverticulitis.  MRI brain showed acute infarct and chronic changes from prior infarct.  Patient received morphine , Ativan , Zofran , 500 cc IV fluid.   Neurology consulted recommended the above MRI and EEG for further evaluation.  Further recs pending.  Review of Systems: As per HPI otherwise all other systems reviewed and are negative.  Past Medical History:  Diagnosis Date   Acute ischemic left middle cerebral artery (MCA) stroke (HCC) 04/17/2024   Asthma    C. difficile colitis    Cerebrovascular accident (CVA) due to embolism of precerebral artery (HCC) 05/25/2024   Cervical stenosis of spine    Chronic headaches    Chronic neck pain    Colitis due to Clostridium difficile 02/10/2015   Coronary artery disease    Depression    Diverticulosis    Essential hypertension    Fibromyalgia    GERD (gastroesophageal reflux disease)    History of CVA (cerebrovascular accident) 05/06/2024   History of Salmonella gastroenteritis    HNP (herniated nucleus pulposus), lumbar    Hyperlipidemia    IBS (irritable bowel syndrome)    Lung nodule    rheumatoid arthritis    Sepsis (HCC) 05/22/2024   Sepsis due to pneumonia (HCC) 05/06/2024    Past Surgical History:  Procedure Laterality Date   APPENDECTOMY  1965   BIOPSY  08/26/2018   Procedure: BIOPSY;  Surgeon: Golda Claudis PENNER, MD;  Location: [facility];  Service: Endoscopy;;  gastric   BREAST BIOPSY     BREAST REDUCTION SURGERY  1994   CATARACT EXTRACTION W/PHACO Left 06/09/2023   Procedure: CATARACT EXTRACTION PHACO AND INTRAOCULAR LENS PLACEMENT (IOC);  Surgeon: Harrie Agent, MD;  Location: [facility];  Service: Ophthalmology;  Laterality: Left;  CDE 7.03   CATARACT EXTRACTION W/PHACO Right  06/23/2023   Procedure: CATARACT EXTRACTION PHACO AND INTRAOCULAR LENS PLACEMENT (IOC);  Surgeon: Harrie Agent, MD;  Location: [facility];  Service: Ophthalmology;  Laterality: Right;  CDE: 11.55   CHOLECYSTECTOMY  1975   COLONOSCOPY N/A 08/19/2013   Procedure: COLONOSCOPY;  Surgeon: Claudis RAYMOND Rivet, MD;  Location: [facility];  Service: Endoscopy;  Laterality: N/A;  155-moved to 140 Ann to notify  pt   COLONOSCOPY N/A 08/26/2018   Procedure: COLONOSCOPY;  Surgeon: Rivet Claudis RAYMOND, MD;  Location: [facility];  Service: Endoscopy;  Laterality: N/A;   ESOPHAGOGASTRODUODENOSCOPY N/A 08/26/2018   Procedure: ESOPHAGOGASTRODUODENOSCOPY (EGD);  Surgeon: Rivet Claudis RAYMOND, MD;  Location: [facility];  Service: Endoscopy;  Laterality: N/A;   fibromyalgia     LOOP RECORDER INSERTION N/A 05/26/2024   Procedure: LOOP RECORDER INSERTION;  Surgeon: Lesia Ozell Barter, PA-C;  Location: [facility] INVASIVE CV LAB;  Service: Cardiovascular;  Laterality: N/A;   LUMBAR LAMINECTOMY/DECOMPRESSION MICRODISCECTOMY Bilateral 09/20/2015   Procedure: MICRO LUMBAR DECOMPRESSION L5-S1 BILATERAL    (1 LEVEL);  Surgeon: Reyes Billing, MD;  Location: [facility];  Service: Orthopedics;  Laterality: Bilateral;   rheumatoid arthritis     Sinus Surgery     TONSILLECTOMY  1968   TRANSESOPHAGEAL ECHOCARDIOGRAM (CATH LAB) N/A 05/25/2024   Procedure: TRANSESOPHAGEAL ECHOCARDIOGRAM;  Surgeon: Lonni Slain, MD;  Location: [facility] INVASIVE CV LAB;  Service: Cardiovascular;  Laterality: N/A;    Social History  reports that she quit smoking about 35 years ago. Her smoking use included cigarettes. She started smoking about 45 years ago. She has a 20 pack-year smoking history. She has never used smokeless tobacco. She reports that she does not drink alcohol and does not use drugs.  Allergies  Allergen Reactions   Hydromorphone  Anaphylaxis   Sulfa Antibiotics Rash    Family History  Problem Relation Age of Onset   Heart disease Mother    Hypertension Mother    Asthma Mother    Heart disease Father    Stroke Father    Hypertension Father    Stroke Sister    Asthma Sister    Hypertension Brother    Asthma Brother    Asthma Maternal Grandmother    Heart disease Maternal Grandmother    Diabetes Maternal Grandfather    Asthma Maternal Aunt    Colon cancer Neg Hx   Reviewed on admission  Prior to Admission medications    Medication Sig Start Date End Date Taking? Authorizing Provider  acetaminophen  (TYLENOL ) 325 MG tablet Take 1-2 tablets (325-650 mg total) by mouth every 4 (four) hours as needed for mild pain (pain score 1-3). Patient taking differently: Take 650 mg by mouth daily as needed for mild pain (pain score 1-3) or moderate pain (pain score 4-6). 04/19/24   Love, Sharlet RAMAN, PA-C  albuterol  (VENTOLIN  HFA) 108 (90 Base) MCG/ACT inhaler INHALE 2 PUFFS INTO THE LUNGS EVERY 4 HOURS AS NEEDED FOR WHEEZING OR SHORTNESS OF BREATH 06/03/24   Iva Marty Saltness, MD  amLODipine  (NORVASC ) 10 MG tablet Take 1 tablet (10 mg total) by mouth daily. 04/28/24   Love, Sharlet RAMAN, PA-C  aspirin  EC 81 MG tablet Take 1 tablet (81 mg total) by mouth daily. Swallow whole. 05/27/24   Dibia, Pauline E, MD  benralizumab  (FASENRA ) 30 MG/ML prefilled syringe Inject 1 mL (30 mg total) into the skin every 8 (eight) weeks. Patient not taking: Reported on 06/14/2024 07/31/23   Iva Marty Saltness, MD  Certolizumab Pegol (CIMZIA [place]) Inject 1 Dose  into the skin every 14 (fourteen) days. Patient not taking: Reported on 06/14/2024    [provider]  clindamycin (CLEOCIN T) 1 % lotion Apply 1 Application topically 2 (two) times daily.    [provider]  clonazePAM (KLONOPIN) 0.25 MG disintegrating tablet Take 0.25 mg by mouth. 06/01/24   [provider]  clopidogrel  (PLAVIX ) 75 MG tablet Take 1 tablet (75 mg total) by mouth daily. 04/28/24 07/27/24  Love, Sharlet RAMAN, PA-C  DULoxetine  (CYMBALTA ) 30 MG capsule Take 1 capsule (30 mg total) by mouth 2 (two) times daily. 04/28/24   Love, Sharlet RAMAN, PA-C  ferrous sulfate 325 (65 FE) MG EC tablet Take 325 mg by mouth daily.    [provider]  Fluticasone -Umeclidin-Vilant (TRELEGY ELLIPTA ) 200-62.5-25 MCG/ACT AEPB Inhale 1 puff into the lungs in the morning. Patient taking differently: Inhale 2 puffs into the lungs in the morning. 10/15/23   Iva Marty Saltness, MD   leflunomide  (ARAVA ) 20 MG tablet Take 20 mg by mouth daily.    [provider]  melatonin 5 MG TABS Take 1 tablet (5 mg total) by mouth at bedtime. 04/28/24   Love, Sharlet RAMAN, PA-C  metoprolol  succinate (TOPROL  XL) 50 MG 24 hr tablet Take 1 tablet (50 mg total) by mouth daily. 05/09/24 06/14/24  Laurence Locus, DO  pantoprazole  (PROTONIX ) 40 MG tablet Take 1 tablet (40 mg total) by mouth daily. 04/28/24   Love, Sharlet RAMAN, PA-C  potassium chloride  (KLOR-CON  M) 10 MEQ tablet Take 1 tablet (10 mEq total) by mouth daily. 05/09/24   Laurence Locus, DO  pregabalin  (LYRICA ) 25 MG capsule Take 25 mg by mouth 2 (two) times daily. 05/20/24   [provider]  rosuvastatin  (CRESTOR ) 20 MG tablet Take 1 tablet (20 mg total) by mouth daily. 04/28/24   Love, Sharlet RAMAN, PA-C  traZODone  (DESYREL ) 50 MG tablet Take 0.5 tablets (25 mg total) by mouth at bedtime. 04/28/24   Maurice Sharlet RAMAN, PA-C    Physical Exam: Vitals:   06/16/24 1040 06/16/24 1137 06/16/24 1300 06/16/24 1400  BP: 135/84  (!) 144/81 (!) 145/85  Pulse: (!) 123  (!) 135 (!) 116  Resp: (!) 23  (!) 22 (!) 22  Temp: (!) 97.4 F (36.3 C)  97.6 F (36.4 C) 97.6 F (36.4 C)  TempSrc:   Oral Oral  SpO2: 100%  100% 95%  Weight:  59 kg    Height:  5' 3 (1.6 m)      Physical Exam Constitutional:      General: She is not in acute distress.    Appearance: Normal appearance.  HENT:     Head: Normocephalic and atraumatic.     Mouth/Throat:     Mouth: Mucous membranes are moist.     Pharynx: Oropharynx is clear.  Eyes:     Extraocular Movements: Extraocular movements intact.     Pupils: Pupils are equal, round, and reactive to light.  Cardiovascular:     Rate and Rhythm: Normal rate and regular rhythm.     Pulses: Normal pulses.     Heart sounds: Normal heart sounds.  Pulmonary:     Effort: Pulmonary effort is normal. No respiratory distress.     Breath sounds: Normal breath sounds.  Abdominal:     General: Bowel sounds are normal.  There is no distension.     Palpations: Abdomen is soft.     Tenderness: There is no abdominal tenderness.  Musculoskeletal:  General: No swelling or deformity.  Skin:    General: Skin is warm and dry.  Neurological:     Comments: Mental Status: Patient is awake, alert  Expressive aphasia Cranial Nerves: II: Pupils equal, round, and reactive to light.   III,IV, VI: EOMI without ptosis or diploplia.  V: Facial sensation is symmetric to light touch. VII: Mild left facial droop. VIII: hearing is intact to voice X: Uvula elevates symmetrically XI: Shoulder shrug is symmetric. XII: tongue is largely midline but with mild rightward deviation. Motor: Good effort thorughout, at Least 5/5 bilateral UE, 5/5 bilateral lower extremitiy  Sensory: Sensation is grossly intact bilateral UEs & LEs Cerebellar: Finger-Nose intact bilalat, but possibly slower on the right.    Labs on Admission: I have personally reviewed following labs and imaging studies  CBC: Recent Labs  Lab 06/16/24 1234 06/16/24 1244  WBC 18.1*  --   NEUTROABS 12.5*  --   HGB 12.8 13.6  HCT 39.2 40.0  MCV 86.7  --   PLT 194  --     Basic Metabolic Panel: Recent Labs  Lab 06/16/24 1234 06/16/24 1244  NA 140 141  K 3.6 3.5  CL 104 109  CO2 17*  --   GLUCOSE 126* 99  BUN 13 14  CREATININE 1.12* 0.90  CALCIUM  10.2  --     GFR: Estimated Creatinine Clearance: 44 mL/min (by C-G formula based on SCr of 0.9 mg/dL).  Liver Function Tests: Recent Labs  Lab 06/16/24 1234  AST 28  ALT 17  ALKPHOS 61  BILITOT 0.6  PROT 7.6  ALBUMIN 3.8    Urine analysis:    Component Value Date/Time   COLORURINE STRAW (A) 05/23/2024 0452   APPEARANCEUR CLEAR 05/23/2024 0452   APPEARANCEUR Clear 01/18/2022 1311   LABSPEC 1.020 05/23/2024 0452   PHURINE 6.0 05/23/2024 0452   GLUCOSEU NEGATIVE 05/23/2024 0452   HGBUR NEGATIVE 05/23/2024 0452   BILIRUBINUR NEGATIVE 05/23/2024 0452   BILIRUBINUR Negative  01/18/2022 1311   KETONESUR 5 (A) 05/23/2024 0452   PROTEINUR NEGATIVE 05/23/2024 0452   UROBILINOGEN 0.2 03/09/2015 1035   NITRITE NEGATIVE 05/23/2024 0452   LEUKOCYTESUR NEGATIVE 05/23/2024 0452    Radiological Exams on Admission: MR BRAIN WO CONTRAST Result Date: 06/16/2024 EXAM: MRI BRAIN WITHOUT CONTRAST 06/16/2024 02:43:21 PM TECHNIQUE: Multiplanar multisequence MRI of the head/brain was performed without the administration of intravenous contrast. COMPARISON: Head CT 06/16/2024 and MRI 05/23/2024. CLINICAL HISTORY: Stroke, follow up. Recent fall. Aphasia. History of stroke. FINDINGS: BRAIN AND VENTRICLES: A punctate focus of restricted diffusion involving cortex in the right precentral gyrus is new from the prior MRI and consistent with an acute infarct. The cortical and subcortical infarction in the left frontoparietal region has further evolved from the prior MRI with progressive encephalomalacia, and a smaller late subacute to chronic infarct is again noted in the right periatrial white matter. Restricted diffusion associated with the subcentimeter acute left cerebellar infarct on the prior MRI has resolved. T2 hyperintensities elsewhere in the cerebral white matter bilaterally and in the pons are unchanged and nonspecific but compatible with mild chronic small vessel ischemic disease. Chronic lacunar infarcts are noted in the basal ganglia bilaterally. No intracranial hemorrhage, mass, midline shift, hydrocephalus, or extra axial fluid collection is identified. There is mild cerebral atrophy. Major intracranial vascular flow voids are preserved. ORBITS: Bilateral cataract extraction. SINUSES AND MASTOIDS: Small right mastoid effusion. Clear paranasal sinuses. BONES AND SOFT TISSUES: Normal marrow signal. No acute soft tissue abnormality. IMPRESSION:  1. Punctate acute infarct in the right precentral gyrus. 2. Chronic ischemia with multiple old infarcts as above, including evolving late subacute  to chronic bilateral MCA infarcts. Electronically signed by: Dasie Hamburg MD 06/16/2024 03:21 PM EST RP Workstation: [id number]   DG Ankle 2 Views Left Result Date: 06/16/2024 EXAM: 2 VIEW(S) XRAY OF THE LEFT ANKLE 06/16/2024 01:51:00 PM CLINICAL HISTORY: trauma COMPARISON: None available. FINDINGS: BONES AND JOINTS: No acute fracture. No focal osseous lesion. No joint dislocation. SOFT TISSUES: The soft tissues are unremarkable. IMPRESSION: 1. No acute osseous abnormality. Electronically signed by: Lynwood Seip MD 06/16/2024 02:16 PM EST RP Workstation: [id number]   DG Chest Port 1 View Result Date: 06/16/2024 EXAM: 1 VIEW(S) XRAY OF THE CHEST 06/16/2024 01:51:00 PM COMPARISON: 05/22/2024 CLINICAL HISTORY: Trauma FINDINGS: LUNGS AND PLEURA: Stable left basilar scarring. No pleural effusion. No pneumothorax. HEART AND MEDIASTINUM: No acute abnormality of the cardiac and mediastinal silhouettes. BONES AND SOFT TISSUES: No acute osseous abnormality. IMPRESSION: 1. No acute cardiopulmonary process. Electronically signed by: Lynwood Seip MD 06/16/2024 02:14 PM EST RP Workstation: [id number]   CT CHEST ABDOMEN PELVIS W CONTRAST Result Date: 06/16/2024 CLINICAL DATA:  Syncope with fall hitting back of head and neck. Abdominal pain. EXAM: CT CHEST, ABDOMEN, AND PELVIS WITH CONTRAST TECHNIQUE: Multidetector CT imaging of the chest, abdomen and pelvis was performed following the standard protocol during bolus administration of intravenous contrast. RADIATION DOSE REDUCTION: This exam was performed according to the departmental dose-optimization program which includes automated exposure control, adjustment of the mA and/or kV according to patient size and/or use of iterative reconstruction technique. CONTRAST:  75mL OMNIPAQUE  IOHEXOL  350 MG/ML SOLN COMPARISON:  CTA chest 05/22/2024 and CT abdomen/pelvis 05/27/2024, CT chest/abdomen/pelvis 09/25/2021 FINDINGS: CT CHEST FINDINGS Cardiovascular: Heart is normal size.  Calcification of the mitral valve annulus. Thoracic aorta is normal in caliber without evidence of aneurysm or dissection. Pulmonary arterial system is unremarkable. Remaining vascular structures are normal. Mediastinum/Nodes: No mediastinal or hilar adenopathy. Mediastinal structures are otherwise unremarkable. Lungs/Pleura: Lungs are adequately inflated and demonstrate mild centrilobular emphysematous disease. Stable mild chronic peripheral reticular density over the upper lobes. No acute airspace process or effusion. Airways are normal. 3 mm subpleural nodule over the lateral left lower lobe unchanged from 2023 and therefore considered benign. Musculoskeletal: No acute fracture. CT ABDOMEN PELVIS FINDINGS Hepatobiliary: Prior cholecystectomy. Mild steatosis of the liver without focal mass. Biliary tree is normal. Pancreas: Normal. Spleen: Normal. Adrenals/Urinary Tract: Adrenal glands are normal. Kidneys are normal in size without hydronephrosis or nephrolithiasis. Right renal cyst unchanged. Bladder and ureters are normal. Stomach/Bowel: Stomach and small bowel are normal. Prior appendectomy. Moderate diverticulosis over the sigmoid colon and distal descending colon. No active inflammation. Mild diverticulosis over the right colon. Vascular/Lymphatic: Minimal calcified plaque over the abdominal aorta which is normal in caliber. Remaining vascular structures are unremarkable. No adenopathy. Reproductive: Uterus and bilateral adnexa are unremarkable. Other: No free fluid or focal inflammatory change. Musculoskeletal: No acute fracture. IMPRESSION: 1. No acute findings in the chest, abdomen or pelvis. 2. Mild centrilobular emphysematous disease. 3. Mild hepatic steatosis. 4. Colonic diverticulosis without active inflammation. 5. Aortic atherosclerosis. Aortic Atherosclerosis (ICD10-I70.0) and Emphysema (ICD10-J43.9). Electronically Signed   By: Toribio Agreste M.D.   On: 06/16/2024 13:51   CT Cervical Spine Wo  Contrast Result Date: 06/16/2024 EXAM: CT CERVICAL SPINE WITH CONTRAST 06/16/2024 01:19:54 PM TECHNIQUE: CT of the cervical spine was performed with the administration of 75 mL of iohexol  (OMNIPAQUE ) 350 MG/ML injection. Multiplanar reformatted images  are provided for review. Automated exposure control, iterative reconstruction, and/or weight based adjustment of the mA/kV was utilized to reduce the radiation dose to as low as reasonably achievable. COMPARISON: Cervical spine 09/25/2021. CLINICAL HISTORY: Trauma. Mechanical fall secondary to dizziness. FINDINGS: CERVICAL SPINE: BONES AND ALIGNMENT: No acute fracture or traumatic malalignment. 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 Anterior osteophytes at C5-C6 and C6-C7 are stable. Uncovertebral spurring contributes to moderate left foraminal stenosis at C3-C4, C4-C5, and C5-C6. SOFT TISSUES: No prevertebral soft tissue swelling. Atherosclerotic calcifications are present in the carotid bifurcations bilaterally without significant stenosis. IMPRESSION: 1. No acute cervical spine injury related to the reported trauma. 2. 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 3. Moderate left neural foraminal stenosis at C3-4, C4-5, and C5-6. Electronically signed by: Lonni Necessary MD 06/16/2024 01:33 PM EST RP Workstation: [id number]   CT HEAD CODE STROKE WO CONTRAST (LKW 0-4.5h, LVO 0-24h) Result Date: 06/16/2024 EXAM: CT HEAD WITHOUT 06/16/2024 12:52:32 PM TECHNIQUE: CT of the head was performed without the administration of intravenous contrast. Automated exposure control, iterative reconstruction, and/or weight based adjustment of the mA/kV was utilized to reduce the radiation dose to as low as reasonably achievable. COMPARISON: 05/27/2024 CLINICAL HISTORY: Neuro deficit, acute, stroke suspected FINDINGS: BRAIN AND VENTRICLES: No acute intracranial hemorrhage. No mass effect or midline  shift. No extra-axial fluid collection. No evidence of acute infarct. Chronic left parietal lobe infarct. Scattered hypoattenuating foci in supratentorial white matter, nonspecific but most commonly related to chronic microangiopathic changes. No hydrocephalus. ORBITS: No acute abnormality. SINUSES AND MASTOIDS: No acute abnormality. SOFT TISSUES AND SKULL: No acute skull fracture. No acute soft tissue abnormality. LIMITATIONS/ARTIFACTS: Motion artifact limiting evaluation. IMPRESSION: 1. No acute intracranial abnormality. 2. Chronic left parietal lobe infarct. 3. Motion artifact limiting evaluation. 4. These results were communicated to Dr. Matthews at 1:04 PM on 06/16/2024 by secure text page via the [facility] messaging system. Electronically signed by: Lonni Necessary MD 06/16/2024 01:05 PM EST RP Workstation: [id number]   EKG: Independently reviewed.  Sinus tachycardia at 118 bpm.  Nonspecific T wave changes.  Baseline wander and minimal baseline artifact.  Assessment/Plan Active Problems:   Essential hypertension   COPD (chronic obstructive pulmonary disease) (HCC)   Rheumatoid arthritis (HCC)   H/O: stroke   Expressive aphasia - due to prior CVA   Fibromyalgia   Dyslipidemia   Anemia   Depression   Fall Acute CVA Seizure like episode > Patient had a fall at home yesterday after being dizzy.  Did hit neck and back. > Family noticed increase in her baseline slurred speech and some confusion. > Episode in the ED of complete aphasia and staring off concerning for seizure.  > Negative trauma workup in the ED.  MRI came back showing acute CVA > Neurology has been consulted and are following. - Monitor on telemetry - Appreciate neurology recommendations and assistance - Allow for permissive HTN (systolic < 220 and diastolic < 120) - Continue aspirin  and Plavix  - Continue home statin - Hold off on repeat echo, A1c, lipid panel as this was recently done during stroke workup last month - Will  wait for neurology recommendations before pursuing CTA as well - Carotid doppler or CTA head & neck  - Tele monitoring  - SLP eval - PT/OT - Start Keppra  - EEG  Lactic acidosis Leukocytosis > Presumed reactive at this time.  Noted to have leukocytosis to 18.1 and lactic acid of 4.3. >  This is in the setting of fall and acute CVA.  When she presented last month noted to have incidental CVA in the setting of nausea vomiting diarrhea she also had similarly elevated white blood cell count and lactic acid which improved. > Has received 500 cc IV fluids in the ED. - Will continue with IV fluids, trend lactic acid and WBC  Hypertension - Permissive hypertension as above  Hyperlipidemia - Continue home statin  GERD - Continue home PPI  Depression - Continue home duloxetine , trazodone , clonazepam  Rheumatoid arthritis - Continue home Arava   COPD - Replace home Trelegy with formulary Breztri  - As needed albuterol   Anemia - Hemoglobin normal in the ED  Spinal stenosis Fibromyalgia - Continue home Lyrica   DVT prophylaxis: Lovenox  Code Status:   Full Family Communication:  Updated at bedside Disposition Plan:   Patient is from:  Home  Anticipated DC to:  Home  Anticipated DC date:  1 to 2 days  Anticipated DC barriers: None  Consults called:  Neurology Admission status:  Observation, telemetry  Severity of Illness: The appropriate patient status for this patient is OBSERVATION. Observation status is judged to be reasonable and necessary in order to provide the required intensity of service to ensure the patient's safety. The patient's presenting symptoms, physical exam findings, and initial radiographic and laboratory data in the context of their medical condition is felt to place them at decreased risk for further clinical deterioration. Furthermore, it is anticipated that the patient will be medically stable for discharge from the hospital within 2 midnights of admission.     Marsa KATHEE Scurry MD [facility]  How to contact the [facility] Attending or Consulting provider 7A - 7P or covering provider during after hours 7P -7A, for this patient?   Check the care team in [facility] and look for a) attending/consulting [facility] provider listed and b) the [facility] team listed Log into [email or web address] and use [place]'s universal password to access. If you do not have the password, please contact the hospital operator. Locate the [facility] provider you are looking for under [facility] and page to a number that you can be directly reached. If you still have difficulty reaching the provider, please page the [facility] (Director on Call) for the Hospitalists listed on amion for assistance.  06/16/2024, 4:17 PM

## 2024-06-17 ENCOUNTER — Other Ambulatory Visit (HOSPITAL_COMMUNITY): Payer: Self-pay

## 2024-06-17 ENCOUNTER — Telehealth (HOSPITAL_COMMUNITY): Payer: Self-pay | Admitting: Pharmacy Technician

## 2024-06-17 DIAGNOSIS — I6389 Other cerebral infarction: Secondary | ICD-10-CM

## 2024-06-17 DIAGNOSIS — F09 Unspecified mental disorder due to known physiological condition: Secondary | ICD-10-CM | POA: Diagnosis not present

## 2024-06-17 DIAGNOSIS — R4701 Aphasia: Secondary | ICD-10-CM | POA: Diagnosis not present

## 2024-06-17 DIAGNOSIS — J439 Emphysema, unspecified: Secondary | ICD-10-CM | POA: Diagnosis present

## 2024-06-17 DIAGNOSIS — K5901 Slow transit constipation: Secondary | ICD-10-CM | POA: Diagnosis not present

## 2024-06-17 DIAGNOSIS — R29700 NIHSS score 0: Secondary | ICD-10-CM

## 2024-06-17 DIAGNOSIS — Z7902 Long term (current) use of antithrombotics/antiplatelets: Secondary | ICD-10-CM | POA: Diagnosis not present

## 2024-06-17 DIAGNOSIS — K219 Gastro-esophageal reflux disease without esophagitis: Secondary | ICD-10-CM | POA: Diagnosis present

## 2024-06-17 DIAGNOSIS — D649 Anemia, unspecified: Secondary | ICD-10-CM | POA: Diagnosis present

## 2024-06-17 DIAGNOSIS — I10 Essential (primary) hypertension: Secondary | ICD-10-CM | POA: Diagnosis not present

## 2024-06-17 DIAGNOSIS — R41841 Cognitive communication deficit: Secondary | ICD-10-CM | POA: Diagnosis not present

## 2024-06-17 DIAGNOSIS — W010XXA Fall on same level from slipping, tripping and stumbling without subsequent striking against object, initial encounter: Secondary | ICD-10-CM | POA: Diagnosis present

## 2024-06-17 DIAGNOSIS — R269 Unspecified abnormalities of gait and mobility: Secondary | ICD-10-CM | POA: Diagnosis not present

## 2024-06-17 DIAGNOSIS — Z79899 Other long term (current) drug therapy: Secondary | ICD-10-CM | POA: Diagnosis not present

## 2024-06-17 DIAGNOSIS — R296 Repeated falls: Secondary | ICD-10-CM | POA: Diagnosis present

## 2024-06-17 DIAGNOSIS — F32A Depression, unspecified: Secondary | ICD-10-CM | POA: Diagnosis present

## 2024-06-17 DIAGNOSIS — J4489 Other specified chronic obstructive pulmonary disease: Secondary | ICD-10-CM | POA: Diagnosis present

## 2024-06-17 DIAGNOSIS — E785 Hyperlipidemia, unspecified: Secondary | ICD-10-CM | POA: Diagnosis present

## 2024-06-17 DIAGNOSIS — R2990 Unspecified symptoms and signs involving the nervous system: Secondary | ICD-10-CM | POA: Diagnosis present

## 2024-06-17 DIAGNOSIS — R29703 NIHSS score 3: Secondary | ICD-10-CM | POA: Diagnosis present

## 2024-06-17 DIAGNOSIS — K760 Fatty (change of) liver, not elsewhere classified: Secondary | ICD-10-CM | POA: Diagnosis present

## 2024-06-17 DIAGNOSIS — E872 Acidosis, unspecified: Secondary | ICD-10-CM | POA: Diagnosis present

## 2024-06-17 DIAGNOSIS — R29702 NIHSS score 2: Secondary | ICD-10-CM | POA: Diagnosis not present

## 2024-06-17 DIAGNOSIS — I2510 Atherosclerotic heart disease of native coronary artery without angina pectoris: Secondary | ICD-10-CM | POA: Diagnosis present

## 2024-06-17 DIAGNOSIS — Z8673 Personal history of transient ischemic attack (TIA), and cerebral infarction without residual deficits: Secondary | ICD-10-CM | POA: Diagnosis not present

## 2024-06-17 DIAGNOSIS — I69398 Other sequelae of cerebral infarction: Secondary | ICD-10-CM | POA: Diagnosis not present

## 2024-06-17 DIAGNOSIS — Z7982 Long term (current) use of aspirin: Secondary | ICD-10-CM | POA: Diagnosis not present

## 2024-06-17 DIAGNOSIS — I69320 Aphasia following cerebral infarction: Secondary | ICD-10-CM | POA: Diagnosis not present

## 2024-06-17 DIAGNOSIS — M797 Fibromyalgia: Secondary | ICD-10-CM | POA: Diagnosis present

## 2024-06-17 DIAGNOSIS — I110 Hypertensive heart disease with heart failure: Secondary | ICD-10-CM

## 2024-06-17 DIAGNOSIS — J410 Simple chronic bronchitis: Secondary | ICD-10-CM | POA: Diagnosis not present

## 2024-06-17 DIAGNOSIS — M069 Rheumatoid arthritis, unspecified: Secondary | ICD-10-CM | POA: Diagnosis present

## 2024-06-17 DIAGNOSIS — Z8249 Family history of ischemic heart disease and other diseases of the circulatory system: Secondary | ICD-10-CM | POA: Diagnosis not present

## 2024-06-17 DIAGNOSIS — R569 Unspecified convulsions: Secondary | ICD-10-CM

## 2024-06-17 DIAGNOSIS — S0990XA Unspecified injury of head, initial encounter: Secondary | ICD-10-CM | POA: Diagnosis present

## 2024-06-17 DIAGNOSIS — E46 Unspecified protein-calorie malnutrition: Secondary | ICD-10-CM | POA: Diagnosis not present

## 2024-06-17 DIAGNOSIS — Z833 Family history of diabetes mellitus: Secondary | ICD-10-CM | POA: Diagnosis not present

## 2024-06-17 DIAGNOSIS — I509 Heart failure, unspecified: Secondary | ICD-10-CM

## 2024-06-17 DIAGNOSIS — I69319 Unspecified symptoms and signs involving cognitive functions following cerebral infarction: Secondary | ICD-10-CM | POA: Diagnosis not present

## 2024-06-17 DIAGNOSIS — I639 Cerebral infarction, unspecified: Secondary | ICD-10-CM | POA: Diagnosis not present

## 2024-06-17 DIAGNOSIS — Y92009 Unspecified place in unspecified non-institutional (private) residence as the place of occurrence of the external cause: Secondary | ICD-10-CM | POA: Diagnosis not present

## 2024-06-17 LAB — COMPREHENSIVE METABOLIC PANEL WITH GFR
ALT: 15 U/L (ref 0–44)
AST: 21 U/L (ref 15–41)
Albumin: 3 g/dL — ABNORMAL LOW (ref 3.5–5.0)
Alkaline Phosphatase: 51 U/L (ref 38–126)
Anion gap: 17 — ABNORMAL HIGH (ref 5–15)
BUN: 9 mg/dL (ref 8–23)
CO2: 17 mmol/L — ABNORMAL LOW (ref 22–32)
Calcium: 9.4 mg/dL (ref 8.9–10.3)
Chloride: 109 mmol/L (ref 98–111)
Creatinine, Ser: 0.84 mg/dL (ref 0.44–1.00)
GFR, Estimated: >60 mL/min (ref >60)
Glucose, Bld: 85 mg/dL (ref 70–99)
Potassium: 4.1 mmol/L (ref 3.5–5.1)
Sodium: 143 mmol/L (ref 135–145)
Total Bilirubin: 1 mg/dL (ref 0.0–1.2)
Total Protein: 6 g/dL — ABNORMAL LOW (ref 6.5–8.1)

## 2024-06-17 LAB — CBC
HCT: 37.3 % (ref 36.0–46.0)
Hemoglobin: 11.7 g/dL — ABNORMAL LOW (ref 12.0–15.0)
MCH: 27.8 pg (ref 26.0–34.0)
MCHC: 31.4 g/dL (ref 30.0–36.0)
MCV: 88.6 fL (ref 80.0–100.0)
Platelets: 145 K/uL — ABNORMAL LOW (ref 150–400)
RBC: 4.21 MIL/uL (ref 3.87–5.11)
RDW: 15.1 % (ref 11.5–15.5)
WBC: 13.5 K/uL — ABNORMAL HIGH (ref 4.0–10.5)
nRBC: 0 % (ref 0.0–0.2)

## 2024-06-17 MED ORDER — OXYCODONE HCL 5 MG PO TABS
5.0000 mg | ORAL_TABLET | Freq: Four times a day (QID) | ORAL | Status: DC | PRN
  Administered 2024-06-17 – 2024-06-22 (×12): 5 mg via ORAL
  Filled 2024-06-17 (×12): qty 1

## 2024-06-17 MED ORDER — SODIUM CHLORIDE 0.9 % IV SOLN
1.0000 g | INTRAVENOUS | Status: DC
  Administered 2024-06-17 – 2024-06-18 (×2): 1 g via INTRAVENOUS
  Filled 2024-06-17 (×2): qty 10

## 2024-06-17 NOTE — Telephone Encounter (Signed)
 Patient Product/process Development Scientist completed.    The patient is insured through [employer]. Patient has Medicare and is not eligible for a copay card, but may be able to apply for patient assistance or Medicare RX Payment Plan (Patient Must reach out to their plan, if eligible for payment plan), if available.    Ran test claim for Breztri  and the current 30 day co-pay is $30.00.   This test claim was processed through [facility]- copay amounts may vary at other pharmacies due to pharmacy/plan contracts, or as the patient moves through the different stages of their insurance plan.     Reyes Sharps, CPHT Pharmacy Technician Patient Advocate Specialist Lead [facility] Patient Advocate Team Direct Number: [phone]  Fax: [phone]

## 2024-06-17 NOTE — Progress Notes (Signed)
 STROKE TEAM PROGRESS NOTE   INTERIM HISTORY/SUBJECTIVE Betty Golden is a 78 y.o. w/ hx of prior stroke w/ residual expressive aphasia w/ recent admission/discharge on 10/29 on DAPT, CAD, HTN, GERD, RA, anxiety and depression, chronic headaches presents s/p fall. Concern during blood draw for brief unresponsiveness and staring off. LKW 1230 11/19.   Evaluated in ED room. States feeling better but sore after the fall. States she was moving to her [facility] and tripped and fell.  States her speech is the same as prior admission and at time of d/c. Denies acute worsening of speech, weakness or sensation changes. Has a loop recorder in place (10/29), will need to interrogate it.   MRI showed punctate acute infarct of R precentral gyrus w/ chronic ischemia w/ multiple old infarcts including evolving late subacute/chronic b/l MCA infarcts.   OBJECTIVE  CBC    Component Value Date/Time   WBC 13.5 (H) 06/17/2024 0504   RBC 4.21 06/17/2024 0504   HGB 11.7 (L) 06/17/2024 0504   HGB 12.8 11/14/2023 1357   HCT 37.3 06/17/2024 0504   PLT 145 (L) 06/17/2024 0504   PLT 250 11/14/2023 1357   MCV 88.6 06/17/2024 0504   MCH 27.8 06/17/2024 0504   MCHC 31.4 06/17/2024 0504   RDW 15.1 06/17/2024 0504   LYMPHSABS 4.5 (H) 06/16/2024 1234   MONOABS 1.0 06/16/2024 1234   EOSABS 0.0 06/16/2024 1234   BASOSABS 0.1 06/16/2024 1234    BMET    Component Value Date/Time   NA 143 06/17/2024 0504   NA 143 09/08/2017 0942   K 4.1 06/17/2024 0504   CL 109 06/17/2024 0504   CO2 17 (L) 06/17/2024 0504   GLUCOSE 85 06/17/2024 0504   BUN 9 06/17/2024 0504   BUN 18 09/08/2017 0942   CREATININE 0.84 06/17/2024 0504   CREATININE 1.20 (H) 11/14/2023 1357   CALCIUM  9.4 06/17/2024 0504   GFRNONAA >60 06/17/2024 0504   GFRNONAA 47 (L) 11/14/2023 1357    IMAGING past 24 hours EEG adult Result Date: 06/17/2024 Shelton Arlin KIDD, MD     06/17/2024  3:43 AM Patient Name: Betty Golden MRN: [id number] Epilepsy  Attending: Arlin KIDD Shelton Referring Physician/Provider: Waddell Karna LABOR, NP Date: 06/17/2024 Duration: 21.35 mins Patient history: 22uo F with episode of staring. EEG to evaluate for seizure Level of alertness: Awake AEDs during EEG study: LEV Technical aspects: This EEG study was done with scalp electrodes positioned according to the 10-20 International system of electrode placement. Electrical activity was reviewed with band pass filter of 1-70Hz , sensitivity of 7 uV/mm, display speed of 72mm/sec with a 60Hz  notched filter applied as appropriate. EEG data were recorded continuously and digitally stored.  Video monitoring was available and reviewed as appropriate. Description: The posterior dominant rhythm consists of 8 Hz activity of moderate voltage (25-35 uV) seen predominantly in posterior head regions, symmetric and reactive to eye opening and eye closing. Physiologic photic driving was not seen during photic stimulation.  Hyperventilation was  not performed.   IMPRESSION: This study is within normal limits. No seizures or epileptiform discharges were seen throughout the recording. A normal interictal EEG does not exclude the diagnosis of epilepsy. Arlin KIDD Shelton   MR BRAIN WO CONTRAST Result Date: 06/16/2024 EXAM: MRI BRAIN WITHOUT CONTRAST 06/16/2024 02:43:21 PM TECHNIQUE: Multiplanar multisequence MRI of the head/brain was performed without the administration of intravenous contrast. COMPARISON: Head CT 06/16/2024 and MRI 05/23/2024. CLINICAL HISTORY: Stroke, follow up. Recent fall. Aphasia. History of stroke. FINDINGS:  BRAIN AND VENTRICLES: A punctate focus of restricted diffusion involving cortex in the right precentral gyrus is new from the prior MRI and consistent with an acute infarct. The cortical and subcortical infarction in the left frontoparietal region has further evolved from the prior MRI with progressive encephalomalacia, and a smaller late subacute to chronic infarct is again noted in  the right periatrial white matter. Restricted diffusion associated with the subcentimeter acute left cerebellar infarct on the prior MRI has resolved. T2 hyperintensities elsewhere in the cerebral white matter bilaterally and in the pons are unchanged and nonspecific but compatible with mild chronic small vessel ischemic disease. Chronic lacunar infarcts are noted in the basal ganglia bilaterally. No intracranial hemorrhage, mass, midline shift, hydrocephalus, or extra axial fluid collection is identified. There is mild cerebral atrophy. Major intracranial vascular flow voids are preserved. ORBITS: Bilateral cataract extraction. SINUSES AND MASTOIDS: Small right mastoid effusion. Clear paranasal sinuses. BONES AND SOFT TISSUES: Normal marrow signal. No acute soft tissue abnormality. IMPRESSION: 1. Punctate acute infarct in the right precentral gyrus. 2. Chronic ischemia with multiple old infarcts as above, including evolving late subacute to chronic bilateral MCA infarcts. Electronically signed by: Dasie Hamburg MD 06/16/2024 03:21 PM EST RP Workstation: [id number]   DG Ankle 2 Views Left Result Date: 06/16/2024 EXAM: 2 VIEW(S) XRAY OF THE LEFT ANKLE 06/16/2024 01:51:00 PM CLINICAL HISTORY: trauma COMPARISON: None available. FINDINGS: BONES AND JOINTS: No acute fracture. No focal osseous lesion. No joint dislocation. SOFT TISSUES: The soft tissues are unremarkable. IMPRESSION: 1. No acute osseous abnormality. Electronically signed by: Lynwood Seip MD 06/16/2024 02:16 PM EST RP Workstation: [id number]   DG Chest Port 1 View Result Date: 06/16/2024 EXAM: 1 VIEW(S) XRAY OF THE CHEST 06/16/2024 01:51:00 PM COMPARISON: 05/22/2024 CLINICAL HISTORY: Trauma FINDINGS: LUNGS AND PLEURA: Stable left basilar scarring. No pleural effusion. No pneumothorax. HEART AND MEDIASTINUM: No acute abnormality of the cardiac and mediastinal silhouettes. BONES AND SOFT TISSUES: No acute osseous abnormality. IMPRESSION: 1. No acute  cardiopulmonary process. Electronically signed by: Lynwood Seip MD 06/16/2024 02:14 PM EST RP Workstation: [id number]   CT CHEST ABDOMEN PELVIS W CONTRAST Result Date: 06/16/2024 CLINICAL DATA:  Syncope with fall hitting back of head and neck. Abdominal pain. EXAM: CT CHEST, ABDOMEN, AND PELVIS WITH CONTRAST TECHNIQUE: Multidetector CT imaging of the chest, abdomen and pelvis was performed following the standard protocol during bolus administration of intravenous contrast. RADIATION DOSE REDUCTION: This exam was performed according to the departmental dose-optimization program which includes automated exposure control, adjustment of the mA and/or kV according to patient size and/or use of iterative reconstruction technique. CONTRAST:  75mL OMNIPAQUE  IOHEXOL  350 MG/ML SOLN COMPARISON:  CTA chest 05/22/2024 and CT abdomen/pelvis 05/27/2024, CT chest/abdomen/pelvis 09/25/2021 FINDINGS: CT CHEST FINDINGS Cardiovascular: Heart is normal size. Calcification of the mitral valve annulus. Thoracic aorta is normal in caliber without evidence of aneurysm or dissection. Pulmonary arterial system is unremarkable. Remaining vascular structures are normal. Mediastinum/Nodes: No mediastinal or hilar adenopathy. Mediastinal structures are otherwise unremarkable. Lungs/Pleura: Lungs are adequately inflated and demonstrate mild centrilobular emphysematous disease. Stable mild chronic peripheral reticular density over the upper lobes. No acute airspace process or effusion. Airways are normal. 3 mm subpleural nodule over the lateral left lower lobe unchanged from 2023 and therefore considered benign. Musculoskeletal: No acute fracture. CT ABDOMEN PELVIS FINDINGS Hepatobiliary: Prior cholecystectomy. Mild steatosis of the liver without focal mass. Biliary tree is normal. Pancreas: Normal. Spleen: Normal. Adrenals/Urinary Tract: Adrenal glands are normal. Kidneys are normal in size without hydronephrosis  or nephrolithiasis. Right renal  cyst unchanged. Bladder and ureters are normal. Stomach/Bowel: Stomach and small bowel are normal. Prior appendectomy. Moderate diverticulosis over the sigmoid colon and distal descending colon. No active inflammation. Mild diverticulosis over the right colon. Vascular/Lymphatic: Minimal calcified plaque over the abdominal aorta which is normal in caliber. Remaining vascular structures are unremarkable. No adenopathy. Reproductive: Uterus and bilateral adnexa are unremarkable. Other: No free fluid or focal inflammatory change. Musculoskeletal: No acute fracture. IMPRESSION: 1. No acute findings in the chest, abdomen or pelvis. 2. Mild centrilobular emphysematous disease. 3. Mild hepatic steatosis. 4. Colonic diverticulosis without active inflammation. 5. Aortic atherosclerosis. Aortic Atherosclerosis (ICD10-I70.0) and Emphysema (ICD10-J43.9). Electronically Signed   By: Toribio Agreste M.D.   On: 06/16/2024 13:51   CT Cervical Spine Wo Contrast Result Date: 06/16/2024 EXAM: CT CERVICAL SPINE WITH CONTRAST 06/16/2024 01:19:54 PM TECHNIQUE: CT of the cervical spine was performed with the administration of 75 mL of iohexol  (OMNIPAQUE ) 350 MG/ML injection. Multiplanar reformatted images are provided for review. Automated exposure control, iterative reconstruction, and/or weight based adjustment of the mA/kV was utilized to reduce the radiation dose to as low as reasonably achievable. COMPARISON: Cervical spine 09/25/2021. CLINICAL HISTORY: Trauma. Mechanical fall secondary to dizziness. FINDINGS: CERVICAL SPINE: BONES AND ALIGNMENT: No acute fracture or traumatic malalignment. 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 Anterior osteophytes at C5-C6 and C6-C7 are stable. Uncovertebral spurring contributes to moderate left foraminal stenosis at C3-C4, C4-C5, and C5-C6. SOFT TISSUES: No prevertebral soft tissue swelling. Atherosclerotic calcifications are present in the carotid  bifurcations bilaterally without significant stenosis. IMPRESSION: 1. No acute cervical spine injury related to the reported trauma. 2. 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 3. Moderate left neural foraminal stenosis at C3-4, C4-5, and C5-6. Electronically signed by: Lonni Necessary MD 06/16/2024 01:33 PM EST RP Workstation: [id number]   CT HEAD CODE STROKE WO CONTRAST (LKW 0-4.5h, LVO 0-24h) Result Date: 06/16/2024 EXAM: CT HEAD WITHOUT 06/16/2024 12:52:32 PM TECHNIQUE: CT of the head was performed without the administration of intravenous contrast. Automated exposure control, iterative reconstruction, and/or weight based adjustment of the mA/kV was utilized to reduce the radiation dose to as low as reasonably achievable. COMPARISON: 05/27/2024 CLINICAL HISTORY: Neuro deficit, acute, stroke suspected FINDINGS: BRAIN AND VENTRICLES: No acute intracranial hemorrhage. No mass effect or midline shift. No extra-axial fluid collection. No evidence of acute infarct. Chronic left parietal lobe infarct. Scattered hypoattenuating foci in supratentorial white matter, nonspecific but most commonly related to chronic microangiopathic changes. No hydrocephalus. ORBITS: No acute abnormality. SINUSES AND MASTOIDS: No acute abnormality. SOFT TISSUES AND SKULL: No acute skull fracture. No acute soft tissue abnormality. LIMITATIONS/ARTIFACTS: Motion artifact limiting evaluation. IMPRESSION: 1. No acute intracranial abnormality. 2. Chronic left parietal lobe infarct. 3. Motion artifact limiting evaluation. 4. These results were communicated to Dr. Matthews at 1:04 PM on 06/16/2024 by secure text page via the [facility] messaging system. Electronically signed by: Lonni Necessary MD 06/16/2024 01:05 PM EST RP Workstation: [id number]    Vitals:   06/17/24 0839 06/17/24 1230 06/17/24 1245 06/17/24 1307  BP:  (!) 156/79    Pulse:  (!) 105 (!) 106   Resp:  16 19   Temp: 98.1 F (36.7 C)   97.8  F (36.6 C)  TempSrc: Oral   Oral  SpO2:  100% 99%   Weight:      Height:         PHYSICAL EXAM General:  Alert,  in no acute distress Psych:  Mood and affect appropriate for situation CV: Regular rhythm but mild tachycardia on monitor Respiratory:  Regular, unlabored respirations on room air  NEURO:  Mental Status: AA&Ox3, patient is able to give clear and coherent history Speech/Language: mild aphasia (noted to be baseline from prior stroke)  Cranial Nerves:  II: PERRL. Visual fields full.  III, IV, VI: EOMI. Eyelids elevate symmetrically.  V: Sensation is intact to light touch and symmetrical to face.  VII: Face is symmetrical resting and smiling VIII: hearing intact to voice. IX, X: Palate elevates symmetrically. Phonation is normal.  KP:Dynloizm shrug 5/5. XII: tongue is midline without fasciculations. Motor: 5/5 strength to all muscle groups tested.  Tone: is normal and bulk is normal Sensation- Intact to light touch bilaterally. Extinction absent to light touch to DSS.   Coordination: FTN intact bilaterally, HKS: no ataxia in BLE.  Gait- deferred  Most Recent NIH 0     ASSESSMENT/PLAN  Ms. Danette J Markert is a 78 y.o. female with history of hypertension, hyperlipidemia, CVA, residual expressive aphasia, GERD, depression, rheumatoid arthritis, asthma, COPD, anemia, spinal stenosis, fibromyalgia presenting after a fall at home.  NIH on Admission 2  Acute Ischemic Infarct:  acute infarct in the right precentral gyrus  Etiology:  cryptogenic and likely clinically silent Code Stroke CT head No acute intracranial abnormality. Chronic left parietal lobe infarct. MRI  Punctate acute infarct in the right precentral gyrus.  Loop recorder (10/29): interrogation pending  LDL 25 HgbA1c 4.7 EEG- This study is within normal limits. No seizures or epileptiform discharges were seen throughout the recording.  VTE prophylaxis - lovenox  Continue aspirin  81 mg daily and  clopidogrel  75 mg daily 3 months  Therapy recommendations:  Pending Disposition: Home   Hx of Stroke/TIA 10/25-10/29 -Stroke:  left punctate cerebellum infarct, late subacute left parietal infarct, etiology:  likely cardioembolic   CT head with no acute abnormality, subacute left parietal infarct.  CTA head & neck similar proximal left M2 MCA occlusion. No new large vessel occlusion. MRI  Small acute infarct in the posterior left cerebellum. And Late subacute posterior MCA territory infarcts, left > right 2D Echo 10/10: EF 70-75% abnormal motion on the ventricular aspect of the valve, near the severe MAC; TEE showed MV degenerative changes.  clopidogrel  75 mg daily prior to admission, now on aspirin  81 mg daily and clopidogrel  75 mg daily for 3 months and then Plavix  alone. Loop recorder placed: interrogation pending 03/2024 admitted for altered mental status. CT showed chronic right parietal infarct but questionable left acute parietal infarct. CT head and neck showed left M2 occlusion with distal reconstitution. LDL 98, A1c 4.7. Discharged on DAPT for 3 months and recommended 30-day CardioNet monitoring.   Hypertension Home meds:  norvasc , losartan -hydrochlorothiazide , metoprolol  Stable Blood Pressure Goal: BP less than 220/110   Hyperlipidemia Home meds:  Crestor  20mg , resumed in hospital LDL 25, goal < 70 Continue statin at discharge  Other Stroke Risk Factors Advanced age Coronary artery disease Congestive heart failure   Other Active Problems RA  GERD Depression  Fibromyalgia    Hospital day # 0 I have personally obtained history,examined this patient, reviewed notes, independently viewed imaging studies, participated in medical decision making and plan of care.ROS completed by me personally and pertinent positives fully documented  I have made any additions or clarifications directly to the above note. Agree with note above.  Patient with recent workup for stroke found to  have cryptogenic and loop recorder  placed 2 weeks ago.  Presents with frequent falls and MRI scan shows a tiny incidental right frontal infarct which is likely clinically silent.  Recommend interrogate loop recorder.  Continue aspirin  and Plavix  for 3 months and then Plavix  alone and aggressive risk factor modification.  No further stroke workup as she had a full workup few weeks ago.   I personally spent a total of 50 minutes in the care of the patient today including getting/reviewing separately obtained history, performing a medically appropriate exam/evaluation, counseling and educating, placing orders, referring and communicating with other health care professionals, documenting clinical information in the EHR, independently interpreting results, and coordinating care.        Eather Popp, MD Medical Director [facility] Pager: [phone] 06/17/2024 5:13 PM    To contact Stroke Continuity provider, please refer to [email or web address]. After hours, contact General Neurology

## 2024-06-17 NOTE — ED Notes (Signed)
 Pt continues to complain of 10/10 headache. Provider made aware.

## 2024-06-17 NOTE — ED Notes (Signed)
 Writer contacted [employer] to trouble shoot the mudlogger. Writer awaiting call back to assist with troubleshooting. Provider and charge RN is aware.

## 2024-06-17 NOTE — Evaluation (Signed)
 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 05/08/2024   COPD (chronic obstructive pulmonary disease) (HCC) 05/06/2024   H/O: stroke 05/06/2024   Dyslipidemia 02/27/2024   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 difficile colitis    Cerebrovascular accident (CVA) due to embolism of precerebral artery (HCC) 05/25/2024   Cervical stenosis of spine    Chronic headaches    Chronic neck pain    Colitis due to Clostridium difficile 02/10/2015   Coronary artery disease    Depression    Diverticulosis    Essential hypertension    Fibromyalgia    GERD (gastroesophageal reflux disease)    History of CVA (cerebrovascular accident) 05/06/2024   History of Salmonella gastroenteritis    HNP (herniated nucleus pulposus), lumbar    Hyperlipidemia    IBS (irritable bowel syndrome)    Lung nodule    rheumatoid arthritis    Sepsis (HCC) 05/22/2024   Sepsis due to pneumonia (HCC) 05/06/2024   Past Surgical  History:  Past Surgical History:  Procedure Laterality Date   APPENDECTOMY  1965   BIOPSY  08/26/2018   Procedure: BIOPSY;  Surgeon: Golda Claudis PENNER, MD;  Location: [facility];  Service: Endoscopy;;  gastric   BREAST BIOPSY     BREAST REDUCTION SURGERY  1994   CATARACT EXTRACTION W/PHACO Left 06/09/2023   Procedure: CATARACT EXTRACTION PHACO AND INTRAOCULAR LENS PLACEMENT (IOC);  Surgeon: Harrie Agent, MD;  Location: [facility];  Service: Ophthalmology;  Laterality: Left;  CDE 7.03   CATARACT EXTRACTION W/PHACO Right 06/23/2023   Procedure: CATARACT EXTRACTION PHACO AND INTRAOCULAR LENS PLACEMENT (IOC);  Surgeon: Harrie Agent, MD;  Location: [facility];  Service: Ophthalmology;  Laterality: Right;  CDE: 11.55   CHOLECYSTECTOMY  1975   COLONOSCOPY N/A 08/19/2013   Procedure: COLONOSCOPY;  Surgeon: Claudis PENNER Golda, MD;  Location: [facility];  Service: Endoscopy;  Laterality: N/A;  155-moved to 140 Ann to notify pt   COLONOSCOPY N/A 08/26/2018   Procedure: COLONOSCOPY;  Surgeon: Golda Claudis PENNER, MD;  Location: [facility];  Service: Endoscopy;  Laterality: N/A;   ESOPHAGOGASTRODUODENOSCOPY N/A 08/26/2018   Procedure: ESOPHAGOGASTRODUODENOSCOPY (EGD);  Surgeon: Golda Claudis PENNER, MD;  Location: [facility];  Service: Endoscopy;  Laterality: N/A;   fibromyalgia     LOOP RECORDER INSERTION N/A 05/26/2024   Procedure: LOOP RECORDER INSERTION;  Surgeon: Lesia Ozell Barter, PA-C;  Location: [facility] INVASIVE CV LAB;  Service: Cardiovascular;  Laterality: N/A;   LUMBAR LAMINECTOMY/DECOMPRESSION MICRODISCECTOMY Bilateral 09/20/2015   Procedure: MICRO LUMBAR DECOMPRESSION L5-S1 BILATERAL    (1  LEVEL);  Surgeon: Reyes Billing, MD;  Location: [facility];  Service: Orthopedics;  Laterality: Bilateral;   rheumatoid arthritis     Sinus Surgery     TONSILLECTOMY  1968   TRANSESOPHAGEAL ECHOCARDIOGRAM (CATH LAB) N/A 05/25/2024   Procedure: TRANSESOPHAGEAL ECHOCARDIOGRAM;  Surgeon: Lonni Slain, MD;   Location: [facility] INVASIVE CV LAB;  Service: Cardiovascular;  Laterality: N/A;   HPI:  Betty Golden is a 78 y.o. female who presented after a fall at home. MRI 11/19: 1. Punctate acute infarct in the right precentral gyrus.  2. Chronic ischemia with multiple old infarcts as above, including evolving late subacute to chronic bilateral MCA infarcts.  Pt with medical history significant of hypertension, hyperlipidemia, CVA, residual expressive aphasia, GERD, depression, rheumatoid arthritis, asthma, COPD, anemia, spinal stenosis, fibromyalgia. 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  Pt with inconsistent errors, articulatory groping, and increased difficulty with multisyllabic words.  Consontant prolongations and phoneme/syllable repetitions noted as well, dysfluencies presumed to be 2/2 effort with verbal expression rather than representative of a neurogenic stutter.  Pt endorses knowing what she wants to say, but having difficulty getting it out.  She is able to self-cue with spelling words. Pt is aware of errors.  She reports expressive deficits increase with anxiety and frustration.  These deficit were present following prior stroke, but she reports some worsening of verbal expression with this admission.  Pt would benefit from ongoing ST in house and at next level of care.    Pt's cognition was assessed using the SLUMS and there was some inteference of language deficits impacting performance today.  She scopred 19 of 30 indicating indicating a greater than mild impairment.  Pt performed well on both word and narrative recall tasks.  She exhbited deficit in divergent naming, which is strongly suspected to be related in part to apraxic errors.  Pt exhibited difficulty with visuospatial tasks, including clock drawing.  Numbers were correctly  placed in order but began outside of clock. She was uanble to correctly place hands, but exhibited improvement with repeat attempt.  She also exhibited difficulty with figure identification.  Pt had difficulty with calculations and with number repetition > word/phrase repetition.    Pt reports she has had home health PT/OT/ST since cva.  She has concerns about weakness in her legs and more frequent falls.  She would prefer rehab placement and reports a good experience with CIR.  Pt would likely benefit from IPR for speech therapy.  Consider intensive IPR if pt is candidate.    SLP Assessment  SLP Recommendation/Assessment: Patient needs continued Speech Language Pathology Services SLP Visit Diagnosis: Apraxia (R48.2);Aphasia (R47.01);Cognitive communication deficit (R41.841)     Assistance Recommended at Discharge  Frequent or constant Supervision/Assistance  Functional Status Assessment Patient has had a recent decline in their functional status and demonstrates the ability to make significant improvements in function in a reasonable and predictable amount of time.  Frequency and Duration min 2x/week  2 weeks      SLP Evaluation Cognition  Overall Cognitive Status: History of cognitive impairments - at baseline Arousal/Alertness: Awake/alert Orientation Level: Oriented to person;Oriented to place;Oriented to time Year: 2025 Day of Week: Correct Attention: Focused;Sustained Focused Attention: Appears intact Sustained Attention: Appears intact Memory: Appears intact Awareness: Appears intact Problem Solving: Impaired Problem Solving Impairment: Functional basic (calculations)  Comprehension       Expression Expression Primary Mode of Expression: Verbal Verbal Expression Overall Verbal Expression: Appears within functional limits for tasks assessed Level of Generative/Spontaneous Verbalization: Conversation Repetition: Impaired Level of Impairment: Phrase level;Sentence  level Naming: Impairment Divergent: 25-49% accurate Interfering Components: Other (comment) (Apraxia of speech)   Oral / Motor  Motor Speech Overall Motor Speech: Impaired Respiration: Within functional limits Resonance: Within functional limits Articulation: Within functional limitis Intelligibility: Intelligible Motor Planning: Impaired Level of Impairment: Sentence Motor Speech Errors: Aware            Anette FORBES Grippe, MA, CCC-SLP Acute Rehabilitation Services Office: [phone] 06/17/2024, 11:18 AM

## 2024-06-17 NOTE — Progress Notes (Signed)
 [facility]  PROGRESS NOTE  Betty Golden FMW:[id number] DOB: 1946/05/03 DOA: 06/16/2024 PCP: Irven Ozell DEL, MD   Brief HPI:   78 y.o. female with medical history significant of hypertension, hyperlipidemia, CVA, residual expressive aphasia, GERD, depression, rheumatoid arthritis, asthma, COPD, anemia, spinal stenosis, fibromyalgia presenting after a fall at home  Patient reportedly got dizzy and had a fall last night at home. She did hit the back of her neck and her back. Noted to have some abdominal pain and leg pain afterwards. Has history of prior CVA and incidental repeat CVA noted last month. And has slurred speech/expressive aphasia at baseline.  CT head showed no acute abnormality.  CT C-spine showed no acute abnormality.   CT chest abdomen pelvis showed no acute abnormality but showed chronic emphysema, hepatic steatosis, diverticulosis without diverticulitis.   MRI brain showed acute infarct and chronic changes from prior infarct.   While in triage, RN drew blood and family was present at the beside when patient suddenly stopped responding to them and was staring off not responding   Neurology was consulted   Assessment/Plan:   Status post fall/acute CVA/questionable seizure - Presented with fall at home; found to have stroke on MRI brain - Had seizure-like episode in the ED - Neurology consulted, started on Keppra  - Stroke workup started; EEG negative for epileptiform discharges - Continue aspirin  and Plavix  - Continue rosuvastatin  - Follow neurology recommendations   Lactic acidosis/leukocytosis -Presumed reactive - Lactic acid is down from 4.3-1.7 - WBC is down from 18,000-13.5 - UA is abnormal, will obtain urine culture - Will start ceftriaxone  after obtaining urine culture  Hypertension - Blood pressure is mildly elevated -Continue permissive hypertension  Hyperlipidemia - Continue statin  Depression - Continue duloxetine , trazodone ,  clonazepam   Rheumatoid arthritis Continue Areva  Spinal stenosis/fibromyalgia - Continue Lyrica   Anemia - Hemoglobin is stable     DVT prophylaxis: Lovenox   Medications      stroke: early stages of recovery book   Does not apply Once   aspirin  EC  81 mg Oral Daily   budesonide -glycopyrrolate -formoterol   2 puff Inhalation BID   clopidogrel   75 mg Oral Daily   DULoxetine   30 mg Oral BID   enoxaparin  (LOVENOX ) injection  40 mg Subcutaneous Q24H   leflunomide   20 mg Oral Daily   levETIRAcetam   500 mg Oral BID   pantoprazole   40 mg Oral Daily   rosuvastatin   20 mg Oral Daily   traZODone   25 mg Oral QHS     Data Reviewed:   CBG:  Recent Labs  Lab 06/16/24 1244  GLUCAP 128*    SpO2: 99 %    Vitals:   06/17/24 0130 06/17/24 0300 06/17/24 0456 06/17/24 0500  BP: 138/77 (!) 106/56 114/62 (!) 162/83  Pulse: (!) 113 95 (!) 115 (!) 111  Resp: (!) 27 13 17 14   Temp:   98.1 F (36.7 C)   TempSrc:      SpO2: 92% 95% 97% 99%  Weight:      Height:          Data Reviewed:  Basic Metabolic Panel: Recent Labs  Lab 06/16/24 1234 06/16/24 1244 06/17/24 0504  NA 140 141 143  K 3.6 3.5 4.1  CL 104 109 109  CO2 17*  --  17*  GLUCOSE 126* 99 85  BUN 13 14 9   CREATININE 1.12* 0.90 0.84  CALCIUM  10.2  --  9.4    CBC: Recent Labs  Lab 06/16/24 1234  06/16/24 1244 06/17/24 0504  WBC 18.1*  --  13.5*  NEUTROABS 12.5*  --   --   HGB 12.8 13.6 11.7*  HCT 39.2 40.0 37.3  MCV 86.7  --  88.6  PLT 194  --  145*    LFT Recent Labs  Lab 06/16/24 1234 06/17/24 0504  AST 28 21  ALT 17 15  ALKPHOS 61 51  BILITOT 0.6 1.0  PROT 7.6 6.0*  ALBUMIN 3.8 3.0*     Antibiotics: Anti-infectives (From admission, onward)    None        CONSULTS Lovenox   Code Status: Full code  Family Communication: No family at bedside     Subjective   Complains of right shin pain.   Objective    Physical Examination:   General-appears in no acute  distress Heart-S1-S2, regular, no murmur auscultated Lungs-clear to auscultation bilaterally, no wheezing or crackles auscultated Abdomen-soft, nontender, no organomegaly Extremities-no edema in the lower extremities, tenderness at right shin.  No visible injury.  Skin is intact.  No erythema Neuro-alert, oriented x3, no focal deficit noted              Betty Golden Betty Golden   [facility] If 7PM-7AM, please contact night-coverage at [email or web address], Office  [phone]   06/17/2024, 8:23 AM  LOS: 0 days

## 2024-06-17 NOTE — Progress Notes (Signed)
 Admitted from Emergency Department to 3W35    06/17/24 1728  Vitals  Temp (!) 97.5 F (36.4 C)  Temp Source Oral  BP (!) 108/92  MAP (mmHg) 98  BP Location Left Arm  BP Method Automatic  Patient Position (if appropriate) Lying  Pulse Rate 94  Pulse Rate Source Dinamap  Resp (!) 22  Level of Consciousness  Level of Consciousness Alert  MEWS COLOR  MEWS Score Color Green  Oxygen Therapy  SpO2 100 %  O2 Device Room Air  ECG Monitoring  Telemetry Box Number [id number]  Tele Box Verification Completed by Second Verifier Completed (Adriana RN/ Biomedical Engineer)  MEWS Score  MEWS Temp 0  MEWS Systolic 0  MEWS Pulse 0  MEWS RR 1  MEWS LOC 0  MEWS Score 1

## 2024-06-17 NOTE — Procedures (Signed)
 Patient Name: Betty Golden  MRN: [id number]  Epilepsy Attending: Arlin MALVA Krebs  Referring Physician/Provider: Waddell Karna LABOR, NP  Date: 06/17/2024 Duration: 21.35 mins  Patient history: 62uo F with episode of staring. EEG to evaluate for seizure  Level of alertness: Awake  AEDs during EEG study: LEV  Technical aspects: This EEG study was done with scalp electrodes positioned according to the 10-20 International system of electrode placement. Electrical activity was reviewed with band pass filter of 1-70Hz , sensitivity of 7 uV/mm, display speed of 28mm/sec with a 60Hz  notched filter applied as appropriate. EEG data were recorded continuously and digitally stored.  Video monitoring was available and reviewed as appropriate.  Description: The posterior dominant rhythm consists of 8 Hz activity of moderate voltage (25-35 uV) seen predominantly in posterior head regions, symmetric and reactive to eye opening and eye closing. Physiologic photic driving was not seen during photic stimulation.  Hyperventilation was  not performed.     IMPRESSION: This study is within normal limits. No seizures or epileptiform discharges were seen throughout the recording.  A normal interictal EEG does not exclude the diagnosis of epilepsy.   Betty Golden

## 2024-06-17 NOTE — Progress Notes (Signed)
  Inpatient Rehab Admissions Coordinator :  Per therapy recommendations, patient was screened for CIR candidacy by Ottie Glazier RN MSN.  At this time patient appears to be a potential candidate for CIR. I will place a rehab consult per protocol for full assessment. Please call me with any questions.  Ottie Glazier RN MSN Admissions Coordinator [phone]

## 2024-06-17 NOTE — Evaluation (Addendum)
 Physical Therapy Evaluation Patient Details Name: Betty Golden MRN: [id number] DOB: 1946-03-25 Today's Date: 06/17/2024  History of Present Illness  Pt is a 78 y.o. female who presented 06/16/24 after getting dizzy and falling. 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 PTA, she was mod I using a RW and intermittently just a cane for functional mobility. She lives with family in a 2-level house with 1 + 1/2 STE. She does not go upstairs. She reports she has 24/7 care available to her from her family. Currently, the pt is limited by R lower leg pain with palpation and weight bearing. Pt reported she fell and hit it PTA. MD aware and ordering pain meds for pt. She reports baseline aphasia and denies any residual weakness from her prior CVA. Currently, she displays generalized and symmetrical bil lower extremity weakness, worse at the hips than distally. She displays deficits in awareness and memory, not recalling and being surprised by the fact that she had a posterior LOB resulting in her needing modA to recover during this session. She otherwise was able to ambulate with minA for balance, sliding her bil feet to advance them while maintaining a flexed trunk and flexed bil knees. She is at high risk for further falls. Educated pt on her risk for falls and on recs to have someone hold onto and assist her with all standing mobility. She verbalized understanding. She has had a drastic functional decline and is at high risk for falls and is highly motivated to participate and improve, thus she could greatly benefit from intensive inpatient rehab, > 3  hours/day. Will continue to follow acutely.      If plan is discharge home, recommend the following: A lot of help with walking and/or transfers;A lot of help with bathing/dressing/bathroom;Assistance with cooking/housework;Direct supervision/assist for medications management;Direct supervision/assist for financial management;Assist for transportation;Help with stairs or ramp for entrance;Supervision due to cognitive status   Can travel by private vehicle        Equipment Recommendations None recommended by PT  Recommendations for Other Services       Functional Status Assessment Patient has had a recent decline in their functional status and demonstrates the ability to make significant improvements in function in a reasonable and predictable amount of time.     Precautions / Restrictions Precautions Precautions: Fall Recall of Precautions/Restrictions: Impaired Precaution/Restrictions Comments: aphasia at baseline Restrictions Weight Bearing Restrictions Per Provider Order: No      Mobility  Bed Mobility Overal bed mobility: Needs Assistance Bed Mobility: Supine to Sit, Sit to Supine     Supine to sit: Min assist, HOB elevated Sit to supine: Contact guard assist, HOB elevated   General bed mobility comments: Extra time and minA needed to ascend trunk to sit up R edge of stretcher with pt needing L HHA to pull up to sit and cues provided to push up on R elbow. CGA for safety with return to supine    Transfers Overall transfer level: Needs assistance Equipment used: Rolling walker (2 wheels) Transfers: Sit to/from Stand Sit to Stand: Contact guard assist           General transfer comment: Pt pushed  up on RW with bil hands to stand from edge of stretcher, CGA for safety    Ambulation/Gait Ambulation/Gait assistance: Min assist, Mod assist Gait Distance (Feet): 20 Feet Assistive device: Rolling walker (2 wheels) Gait Pattern/deviations: Decreased step length -  right, Decreased step length - left, Decreased stride length, Decreased dorsiflexion - right, Decreased dorsiflexion - left, Knee flexed in stance - left, Knee flexed in stance - right, Antalgic, Trunk flexed, Step-to pattern, Decreased stance time - right, Decreased weight shift to right Gait velocity: reduced Gait velocity interpretation: <1.31 ft/sec, indicative of household ambulator   General Gait Details: Pt with slow, antalgic gait pattern with a flexed trunk and bil knee flexion in stance phase. Verbal and tactile cues provided to extend knees with only momentary success. Pt slides bil feet to advance them when stepping rather than clearing either foot. Pt with x1 posterior LOB needing modA to recover, otherwise minA for balance throughout. Pt limiting distance due to R leg pain  Stairs            Wheelchair Mobility     Tilt Bed    Modified Rankin (Stroke Patients Only) Modified Rankin (Stroke Patients Only) Pre-Morbid Rankin Score: Moderate disability Modified Rankin: Moderately severe disability     Balance Overall balance assessment: Needs assistance Sitting-balance support: No upper extremity supported, Feet supported Sitting balance-Leahy Scale: Fair Sitting balance - Comments: static sitting EOB with CGA for safety   Standing balance support: Bilateral upper extremity supported, Reliant on assistive device for balance, During functional activity Standing balance-Leahy Scale: Poor Standing balance comment: reliant on RW and external physical assistance, x1 posterior LOB needing modA to recover                             Pertinent Vitals/Pain Pain Assessment Pain Assessment: 0-10 Pain Score: 8  Pain Location: R lower leg Pain Descriptors / Indicators: Sore Pain Intervention(s): Limited activity within patient's tolerance, Monitored during session, Repositioned    Home Living Family/patient expects to be discharged to:: Private residence Living  Arrangements: Children Available Help at Discharge: Family;Available 24 hours/day Type of Home: House Home Access: Stairs to enter Entrance Stairs-Rails: Doctor, [facility] of Steps: 1 stair to the garage with 1/2 rise to entrance is her best option opposed to front entrance   Home Layout: Two level;Able to live on main level with bedroom/bathroom;Full bath on main level Home Equipment: Rolling Walker (2 wheels);Wheelchair - manual;Cane - single point;BSC/3in1 Additional Comments: confirmed all info with pt again this admission 06/17/24    Prior Function Prior Level of Function : Needs assist             Mobility Comments: Pt Mod I with mobility using RW and intermittently cane at home ADLs Comments: Pt reports prior to recent CVAs, was Mod I for ADLs but since CVA has been requiring some assist from daughter.     Extremity/Trunk Assessment   Upper Extremity Assessment Upper Extremity Assessment: Defer to OT evaluation    Lower Extremity Assessment Lower Extremity Assessment: Generalized weakness;RLE deficits/detail;LLE deficits/detail RLE Deficits / Details: MMT scores of 3+ hip flexion, 4+ knee extension, 4+ ankle dorsiflexion; noted more weakness functionally with pt flexing at bil knees when standing; denied numbness/tingling bil; pain with palpation anteriorly and posteriorly at lower leg, but no obvious deformities, MD aware and ordered pain meds LLE Deficits / Details: MMT scores of 3+ hip flexion, 4+ knee extension, 4+ ankle dorsiflexion;  noted more weakness functionally with pt flexing at bil knees when standing; denied numbness/tingling bil    Cervical / Trunk Assessment Cervical / Trunk Assessment: Normal  Communication   Communication Communication: Impaired Factors Affecting Communication: Difficulty expressing self;Reduced clarity of speech    Cognition Arousal: Alert Behavior During Therapy: WFL for tasks assessed/performed   PT - Cognitive  impairments: Difficult to assess, No family/caregiver present to determine baseline Difficult to assess due to: Impaired communication                     PT - Cognition Comments: Pt with hx of aphasia, impacting her comprehension and expressive communication at times. However, with intermittent repetition of cues, pt follows simple cues accurately. Decreased awareness of her LOB during the session, seeming surprised when notified PT had to catch her to recover her LOB 1x, indicating some decreased awareness and memory. No family present to confirm baseline. Following commands: Impaired Following commands impaired: Follows one step commands with increased time     Cueing Cueing Techniques: Verbal cues, Tactile cues     General Comments General comments (skin integrity, edema, etc.): VSS on RA; Educated pt on her risk for falls and on recs to have someone hold onto and assist her with all standing mobility. She verbalized understanding and reported she could obtain that level of care from family at home at d/c.    Exercises     Assessment/Plan    PT Assessment Patient needs continued PT services  PT Problem List Decreased strength;Decreased activity tolerance;Decreased balance;Decreased mobility;Decreased cognition;Decreased safety awareness       PT Treatment Interventions DME instruction;Gait training;Stair training;Functional mobility training;Therapeutic activities;Therapeutic exercise;Balance training;Neuromuscular re-education;Cognitive remediation;Patient/family education    PT Goals (Current goals can be found in the Care Plan section)  Acute Rehab PT Goals Patient Stated Goal: to reduce pain and get stronger PT Goal Formulation: With patient Time For Goal Achievement: 07/01/24 Potential to Achieve Goals: Fair    Frequency Min 2X/week     Co-evaluation               AM-PAC PT 6 Clicks Mobility  Outcome Measure Help needed turning from your back to your  side while in a flat bed without using bedrails?: A Little Help needed moving from lying on your back to sitting on the side of a flat bed without using bedrails?: A Little Help needed moving to and from a bed to a chair (including a wheelchair)?: A Little Help needed standing up from a chair using your arms (e.g., wheelchair or bedside chair)?: A Little Help needed to walk in hospital room?: A Lot Help needed climbing 3-5 steps with a railing? : Total 6 Click Score: 15    End of Session Equipment Utilized During Treatment: Gait belt Activity Tolerance: Patient tolerated treatment well;Patient limited by pain Patient left: in bed;with call bell/phone within reach   PT Visit Diagnosis: Difficulty in walking, not elsewhere classified (R26.2);Unsteadiness on feet (R26.81);Other abnormalities of gait and mobility (R26.89);Muscle weakness (generalized) (M62.81);Other symptoms and signs involving the nervous system (R29.898)    Time: 9092-9071 PT Time Calculation (min) (ACUTE ONLY): 21 min   Charges:   PT Evaluation $PT Eval Moderate Complexity: 1 Mod   PT General Charges $$ ACUTE PT VISIT: 1 Visit         Theo Ferretti, PT, DPT Acute Rehabilitation Services  Office: [phone]   Theo CHRISTELLA Ferretti 06/17/2024, 9:49 AM

## 2024-06-17 NOTE — Evaluation (Signed)
 Occupational Therapy Evaluation Patient Details Name: Betty Golden MRN: [id number] DOB: 1946/05/10 Today's Date: 06/17/2024   History of Present Illness   Pt is a 78 y.o. female who presented 06/16/24 after getting dizzy and falling. 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 Reports she sleeps on the couch on the first floor of her home and is able to transfer to and from [facility] modified independently. She walks in the house with RW and assistance of her family. Pt does not like showering. Reports her daughter sponge bathes her and assists with dressing and all IADLs. Pt presents with B LE soreness and weakness, B shoulder weakness with R hand slightly weaker than L. She demonstrates ability to transfer to and from [facility] from stretcher with min assist. She completes ADLs with set up to moderate assistance. Pt is requesting further inpatient rehab with goal of being able to reduce fall risk. She reports she is sedentary and wishes to be more active and less of a burden on her family. Patient will benefit from intensive inpatient follow-up therapy, >3 hours/day.     If plan is discharge home, recommend the following:   A lot of help with walking and/or transfers;A lot of help with bathing/dressing/bathroom;Assist for transportation;Help with stairs or ramp for entrance;Assistance with cooking/housework;Direct supervision/assist for medications management;Direct supervision/assist for financial management     Functional Status Assessment   Patient has had a recent decline in their functional status and demonstrates the ability to make significant  improvements in function in a reasonable and predictable amount of time.     Equipment Recommendations   None recommended by OT     Recommendations for Other Services         Precautions/Restrictions   Precautions Precautions: Fall Restrictions Weight Bearing Restrictions Per Provider Order: No     Mobility Bed Mobility Overal bed mobility: Needs Assistance Bed Mobility: Supine to Sit, Sit to Supine     Supine to sit: Supervision Sit to supine: Supervision   General bed mobility comments: head of stretcher up    Transfers Overall transfer level: Needs assistance Equipment used: 1 person hand held assist Transfers: Sit to/from Stand, Bed to chair/wheelchair/BSC Sit to Stand: Min assist     Step pivot transfers: Min assist     General transfer comment: steadying assist      Balance Overall balance assessment: Needs assistance   Sitting balance-Leahy Scale: Good     Standing balance support: During functional activity, Single extremity supported Standing balance-Leahy Scale: Poor                             ADL either performed or assessed with clinical judgement   ADL Overall ADL's : Needs assistance/impaired Eating/Feeding: Set up;Bed level Eating/Feeding Details (indicate cue type and reason): only observed drinking with straw Grooming: Wash/dry hands;Wash/dry face;Sitting;Supervision/safety   Upper Body Bathing: Minimal assistance;Sitting   Lower Body Bathing: Maximal assistance;Sit to/from stand   Upper Body Dressing : Set up;Sitting   Lower Body Dressing: Minimal assistance;Sitting/lateral leans Lower Body Dressing Details (indicate cue type and reason): long sitting, assist for R sock over toes,  can perform figure 4 to reach L foot Toilet Transfer: Minimal assistance;Stand-pivot;BSC/3in1   Toileting- Clothing Manipulation and Hygiene: Moderate assistance;Sit to/from stand               Vision Baseline Vision/History:  1 Wears glasses Ability to See in Adequate Light: 0 Adequate Patient Visual Report: No change from baseline Vision Assessment?: No apparent visual deficits     Perception         Praxis         Pertinent Vitals/Pain Pain Assessment Pain Assessment: Faces (none reported) Faces Pain Scale: Hurts even more Pain Location: BLEs Pain Descriptors / Indicators: Grimacing, Sore Pain Intervention(s): Monitored during session     Extremity/Trunk Assessment Upper Extremity Assessment Upper Extremity Assessment: Right hand dominant;RUE deficits/detail;LUE deficits/detail RUE Deficits / Details: 3+/5 shoulder, 4/5 elbow, gross graspt 4-/5 LUE Deficits / Details: 4-/5 shoulder, 4/5 elbow to hand   Lower Extremity Assessment Lower Extremity Assessment: Defer to PT evaluation RLE Deficits / Details: MMT scores of 3+ hip flexion, 4+ knee extension, 4+ ankle dorsiflexion; noted more weakness functionally with pt flexing at bil knees when standing; denied numbness/tingling bil; pain with palpation anteriorly and posteriorly at lower leg, but no obvious deformities, MD aware and ordered pain meds LLE Deficits / Details: MMT scores of 3+ hip flexion, 4+ knee extension, 4+ ankle dorsiflexion; noted more weakness functionally with pt flexing at bil knees when standing; denied numbness/tingling bil   Cervical / Trunk Assessment Cervical / Trunk Assessment: Normal   Communication Communication Communication: Impaired Factors Affecting Communication: Difficulty expressing self   Cognition Arousal: Alert Behavior During Therapy: WFL for tasks assessed/performed Cognition: No family/caregiver present to determine baseline             OT - Cognition Comments: pt offering conflicting history to PT and OT                 Following commands: Impaired Following commands impaired: Follows one step commands with increased time     Cueing  General Comments   Cueing Techniques: Verbal  cues  HR to 128 with transfer to [facility]   Exercises     Shoulder Instructions      Home Living Family/patient expects to be discharged to:: Private residence Living Arrangements: Children Available Help at Discharge: Family;Available 24 hours/day Type of Home: House Home Access: Stairs to enter [employer] of Steps: 1 stair to the garage with 1/2 rise to entrance is her best option opposed to front entrance Entrance Stairs-Rails: Right;Left Home Layout: Two level;Able to live on main level with bedroom/bathroom;Full bath on main level;Other (Comment) (stays on couch)     Bathroom Shower/Tub: Walk-in shower   Bathroom Toilet: Standard Bathroom Accessibility: Yes   Home Equipment: [employer] (2 wheels);Wheelchair - manual;Cane - single point;BSC/3in1   Additional Comments: confirmed all info with pt again this admission 06/17/24      Prior Functioning/Environment Prior Level of Function : Needs assist             Mobility Comments: reports use of RW and assistance for ambulation, reports she is sedentary ADLs Comments: mostly sponge bathes, does not like the feeling of shower on her body, daughter bathes her, assists with dressing and does all IADLs for pt    OT Problem List: Decreased strength;Decreased activity tolerance;Impaired balance (sitting and/or standing);Decreased cognition   OT Treatment/Interventions: Self-care/ADL training;Therapeutic exercise;DME and/or AE instruction;Therapeutic activities;Patient/family education;Balance training;Cognitive remediation/compensation      OT Goals(Current goals can be  found in the care plan section)   Acute Rehab OT Goals OT Goal Formulation: With patient Time For Goal Achievement: 07/01/24 Potential to Achieve Goals: Good   OT Frequency:  Min 2X/week    Co-evaluation              AM-PAC OT 6 Clicks Daily Activity     Outcome Measure Help from another person eating meals?: A Little Help from  another person taking care of personal grooming?: A Little Help from another person toileting, which includes using toliet, bedpan, or urinal?: A Lot Help from another person bathing (including washing, rinsing, drying)?: A Lot Help from another person to put on and taking off regular upper body clothing?: A Little Help from another person to put on and taking off regular lower body clothing?: A Lot 6 Click Score: 15   End of Session Equipment Utilized During Treatment: Gait belt  Activity Tolerance: Patient tolerated treatment well Patient left: in bed;with call bell/phone within reach  OT Visit Diagnosis: Unsteadiness on feet (R26.81);Other abnormalities of gait and mobility (R26.89);Muscle weakness (generalized) (M62.81)                Time: 1022-1100 OT Time Calculation (min): 38 min Charges:  OT General Charges $OT Visit: 1 Visit OT Evaluation $OT Eval Moderate Complexity: 1 Mod OT Treatments $Self Care/Home Management : 23-37 mins  Mliss HERO, OTR/L Acute Rehabilitation Services Office: [phone]   Kennth Mliss Helling 06/17/2024, 11:17 AM

## 2024-06-17 NOTE — ED Notes (Signed)
 Changed out patient bed patient walked to the other bed with assistance patient is now back on monitor with call bell in reach

## 2024-06-18 DIAGNOSIS — J410 Simple chronic bronchitis: Secondary | ICD-10-CM | POA: Diagnosis not present

## 2024-06-18 DIAGNOSIS — R29702 NIHSS score 2: Secondary | ICD-10-CM

## 2024-06-18 DIAGNOSIS — R4701 Aphasia: Secondary | ICD-10-CM | POA: Diagnosis not present

## 2024-06-18 DIAGNOSIS — R2990 Unspecified symptoms and signs involving the nervous system: Secondary | ICD-10-CM | POA: Diagnosis not present

## 2024-06-18 DIAGNOSIS — I639 Cerebral infarction, unspecified: Secondary | ICD-10-CM | POA: Diagnosis not present

## 2024-06-18 LAB — URINE CULTURE

## 2024-06-18 MED ORDER — ONDANSETRON HCL 4 MG/2ML IJ SOLN
4.0000 mg | Freq: Four times a day (QID) | INTRAMUSCULAR | Status: DC | PRN
  Administered 2024-06-18: 4 mg via INTRAVENOUS
  Filled 2024-06-18: qty 2

## 2024-06-18 NOTE — Progress Notes (Addendum)
 Physical Therapy Treatment Patient Details Name: DIANEY SUCHY MRN: [id number] DOB: 1945-10-15 Today's Date: 06/18/2024   History of Present Illness Pt is a 78 y.o. female who presented 06/16/24 after getting dizzy and falling. 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 Pt continues to have pain in R LE to touch, however, did not report an increase in pain with weight-bearing. Pt had increased difficulty following commands with poor carryover of cues for technique. Pt required MinA to stand and MinA to ambulate 51ft with a RW. Pt had limited WB on RLE with R knee instability noted. No overt buckling observed, however, pt had poor quad contraction with knee slowly moving into flexed position. Worked on balance and LE strength with exercises involving stepping to and over obstacles on the ground. Seated rest breaks in between exercises due to an increase in HR, RN aware. Continue to recommend >3hrs post acute rehab with acute PT to follow.   HR 130s-140 BPM with activity    If plan is discharge home, recommend the following: A lot of help with walking and/or transfers;A lot of help with bathing/dressing/bathroom;Assistance with cooking/housework;Direct supervision/assist for medications management;Direct supervision/assist for financial management;Assist for transportation;Help with stairs or ramp for entrance;Supervision due to cognitive status   Can travel by private vehicle      Yes  Equipment Recommendations  None recommended by PT       Precautions / Restrictions Precautions Precautions: Fall Recall of Precautions/Restrictions: Impaired Precaution/Restrictions Comments:  aphasia at baseline, watch HR Restrictions Weight Bearing Restrictions Per Provider Order: No     Mobility  Bed Mobility Overal bed mobility: Needs Assistance Bed Mobility: Supine to Sit, Sit to Supine    Supine to sit: Supervision Sit to supine: Supervision   General bed mobility comments: for safety    Transfers Overall transfer level: Needs assistance Equipment used: Rolling walker (2 wheels) Transfers: Sit to/from Stand, Bed to chair/wheelchair/BSC Sit to Stand: Min assist   Step pivot transfers: Min assist     General transfer comment: MinA for steadying assist, occasional need for slight boost-up. Repetitive cues for hand placement with pt unable to replicate without assist    Ambulation/Gait Ambulation/Gait assistance: Min assist Gait Distance (Feet): 30 Feet Assistive device: Rolling walker (2 wheels) Gait Pattern/deviations: Decreased step length - right, Decreased step length - left, Decreased stride length, Decreased dorsiflexion - right, Decreased dorsiflexion - left, Knee flexed in stance - right, Antalgic, Trunk flexed, Step-to pattern, Decreased stance time - right, Decreased weight shift to right Gait velocity: reduced     General Gait Details: Antalgic gait pattern with R knee instability. No overt buckling, however, poor quad contaction with full weight bearing.  Modified Rankin (Stroke Patients Only) Modified Rankin (Stroke Patients Only) Pre-Morbid Rankin Score: Moderate disability Modified Rankin: Moderately severe disability     Balance Overall balance assessment: Needs assistance Sitting-balance support: No upper extremity supported, Feet supported Sitting balance-Leahy Scale: Good     Standing balance support: Bilateral upper extremity supported, During functional activity, Reliant on assistive device for balance Standing balance-Leahy Scale: Poor Standing balance comment: reliant on RW and external assist    High Level Balance Comments:  Bx10 toe taps, Bx5 step  over obstacle       Communication Communication Communication: Impaired Factors Affecting Communication: Difficulty expressing self  Cognition Arousal: Alert Behavior During Therapy: WFL for tasks assessed/performed   PT - Cognitive impairments: No family/caregiver present to determine baseline, Awareness, Memory, Attention, Sequencing, Problem solving, Safety/Judgement Difficult to assess due to: Impaired communication    PT - Cognition Comments: Hx of aphasia. Increased difficulty comprehending instructions with multimodal cueing necessary. Following commands: Impaired Following commands impaired: Follows one step commands inconsistently, Follows one step commands with increased time    Cueing Cueing Techniques: Verbal cues  Exercises Other Exercises Other Exercises: x10 sit<>stand with MinA and RW        Pertinent Vitals/Pain Pain Assessment Pain Assessment: Faces Faces Pain Scale: Hurts even more Pain Location: R LE Pain Descriptors / Indicators: Grimacing, Sore Pain Intervention(s): Limited activity within patient's tolerance, Monitored during session, Repositioned     PT Goals (current goals can now be found in the care plan section) Acute Rehab PT Goals PT Goal Formulation: With patient Time For Goal Achievement: 07/01/24 Potential to Achieve Goals: Fair Progress towards PT goals: Progressing toward goals    Frequency    Min 2X/week       AM-PAC PT 6 Clicks Mobility   Outcome Measure  Help needed turning from your back to your side while in a flat bed without using bedrails?: A Little Help needed moving from lying on your back to sitting on the side of a flat bed without using bedrails?: A Little Help needed moving to and from a bed to a chair (including a wheelchair)?: A Little Help needed standing up from a chair using your arms (e.g., wheelchair or bedside chair)?: A Little Help needed to walk in hospital room?: A  Little Help needed climbing 3-5 steps with a railing? : Total 6 Click Score: 16    End of Session Equipment Utilized During Treatment: Gait belt Activity Tolerance: Patient tolerated treatment well Patient left: in bed;with call bell/phone within reach;with bed alarm set Nurse Communication: Mobility status PT Visit Diagnosis: Difficulty in walking, not elsewhere classified (R26.2);Unsteadiness on feet (R26.81);Other abnormalities of gait and mobility (R26.89);Muscle weakness (generalized) (M62.81);Other symptoms and signs involving the nervous system (R29.898)     Time: 8566-8542 PT Time Calculation (min) (ACUTE ONLY): 24 min  Charges:    $Gait Training: 8-22 mins $Therapeutic Exercise: 8-22 mins PT General Charges $$ ACUTE PT VISIT: 1 Visit                    Kate ORN, PT, DPT Secure Chat Preferred  Rehab Office [phone]   Kate BRAVO Wendolyn 06/18/2024, 4:08 PM

## 2024-06-18 NOTE — Progress Notes (Signed)
 STROKE TEAM PROGRESS NOTE   INTERIM HISTORY/SUBJECTIVE Betty Golden is a 78 y.o. w/ hx of prior stroke w/ residual expressive aphasia w/ recent admission/discharge on 10/29 on DAPT, CAD, HTN, GERD, RA, anxiety and depression, chronic headaches presents s/p fall. Concern during blood draw for brief unresponsiveness and staring off. LKW 1230 11/19.   Still sore after the fall. Speech still has difficulty but not worsened. Able to ambulate to [facility] this AM.   Loop recorder interrogated: no acute events/episodes noted (in media tab).  OBJECTIVE  CBC    Component Value Date/Time   WBC 13.5 (H) 06/17/2024 0504   RBC 4.21 06/17/2024 0504   HGB 11.7 (L) 06/17/2024 0504   HGB 12.8 11/14/2023 1357   HCT 37.3 06/17/2024 0504   PLT 145 (L) 06/17/2024 0504   PLT 250 11/14/2023 1357   MCV 88.6 06/17/2024 0504   MCH 27.8 06/17/2024 0504   MCHC 31.4 06/17/2024 0504   RDW 15.1 06/17/2024 0504   LYMPHSABS 4.5 (H) 06/16/2024 1234   MONOABS 1.0 06/16/2024 1234   EOSABS 0.0 06/16/2024 1234   BASOSABS 0.1 06/16/2024 1234    BMET    Component Value Date/Time   NA 143 06/17/2024 0504   NA 143 09/08/2017 0942   K 4.1 06/17/2024 0504   CL 109 06/17/2024 0504   CO2 17 (L) 06/17/2024 0504   GLUCOSE 85 06/17/2024 0504   BUN 9 06/17/2024 0504   BUN 18 09/08/2017 0942   CREATININE 0.84 06/17/2024 0504   CREATININE 1.20 (H) 11/14/2023 1357   CALCIUM  9.4 06/17/2024 0504   GFRNONAA >60 06/17/2024 0504   GFRNONAA 47 (L) 11/14/2023 1357    IMAGING past 24 hours No results found.   Vitals:   06/18/24 0346 06/18/24 0808 06/18/24 0919 06/18/24 1154  BP: (!) 157/70 (!) 154/69  (!) 140/61  Pulse:  (!) 120  (!) 114  Resp: 20 18  18   Temp:  98 F (36.7 C)  99.3 F (37.4 C)  TempSrc:  Oral  Oral  SpO2:  100% 98% 97%  Weight:      Height:         PHYSICAL EXAM General:  Alert, in no acute distress Psych:  Mood and affect appropriate for situation CV: Regular rhythm but tachycardia on  monitor Respiratory:  Regular, unlabored respirations on room air  NEURO:  Mental Status: AA&Ox3, patient is able to give clear and coherent history Speech/Language: mild aphasia (noted to be baseline from prior stroke)  Cranial Nerves:  II: PERRL. III, IV, VI: EOMI. Eyelids elevate symmetrically.  V: Sensation is intact to light touch and symmetrical to face.  VII: Face is symmetrical resting and smiling VIII: hearing intact to voice. IX, X: Palate elevates symmetrically. Phonation is normal.  KP:Dynloizm shrug 5/5. XII: tongue is midline without fasciculations. Motor: 5/5 strength to all muscle groups tested.  Tone: is normal and bulk is normal Sensation- Intact to light touch bilaterally. Extinction absent to light touch to DSS.   Coordination: FTN intact bilaterally. Gait- deferred  Most Recent NIH 2     ASSESSMENT/PLAN  [name] is a 78 y.o. female with history of hypertension, hyperlipidemia, CVA, residual expressive aphasia, GERD, depression, rheumatoid arthritis, asthma, COPD, anemia, spinal stenosis, fibromyalgia presenting after a fall at home.  NIH on Admission 2  Acute Ischemic Infarct:  acute infarct in the right precentral gyrus  Etiology:  cryptogenic and likely clinically silent Code Stroke CT head No acute intracranial abnormality. Chronic  left parietal lobe infarct. MRI  Punctate acute infarct in the right precentral gyrus.  Loop recorder (placed 10/29): No episodes in past 18 days  LDL 25 HgbA1c 4.7 EEG- This study is within normal limits. No seizures or epileptiform discharges were seen throughout the recording.  VTE prophylaxis - enoxaparin  (LOVENOX ) injection 40 mg Start: 06/16/24 1800 Continue aspirin  81 mg daily and clopidogrel  75 mg daily for 3 months then Plavix  alone. Therapy recommendations:  CIR Disposition: CIR pending, anticipate inpatient stroke team to sign off, can arrange f/u outpatient (already has appt w/ Dr. Onita on  12/30)  Hx of Stroke/TIA 10/25-10/29 -Stroke:  left punctate cerebellum infarct, late subacute left parietal infarct, etiology:  likely cardioembolic   CT head with no acute abnormality, subacute left parietal infarct.  CTA head & neck similar proximal left M2 MCA occlusion. No new large vessel occlusion. MRI  Small acute infarct in the posterior left cerebellum. And Late subacute posterior MCA territory infarcts, left > right 2D Echo 10/10: EF 70-75% abnormal motion on the ventricular aspect of the valve, near the severe MAC; TEE showed MV degenerative changes.  clopidogrel  75 mg daily prior to admission, now on aspirin  81 mg daily and clopidogrel  75 mg daily for 3 months and then Plavix  alone. 03/2024 admitted for altered mental status. CT showed chronic right parietal infarct but questionable left acute parietal infarct. CT head and neck showed left M2 occlusion with distal reconstitution. LDL 98, A1c 4.7. Discharged on DAPT for 3 months and recommended 30-day CardioNet monitoring.   Hypertension Home meds:  norvasc , losartan -hydrochlorothiazide , metoprolol  Stable Blood Pressure Goal: BP less than 220/110 in initial 24-48 hours, then gradual normalize BP w/ home BP meds  Hyperlipidemia Home meds:  Crestor  20mg , resumed in hospital LDL 25, goal < 70 Continue statin at discharge  Other Stroke Risk Factors Advanced age Coronary artery disease Congestive heart failure   Other Active Problems RA  GERD Depression  Fibromyalgia    Hospital day # 1  I have personally obtained history,examined this patient, reviewed notes, independently viewed imaging studies, participated in medical decision making and plan of care.ROS completed by me personally and pertinent positives fully documented  I have made any additions or clarifications directly to the above note. Agree with note above.  Continue aspirin  and Plavix  for 3 months followed by Plavix  alone and aggressive risk factor modification.   Loop recorder interrogation is negative for A-fib.  Stroke team will sign off.  Kindly call for questions.  Eather Popp, MD Medical Director [facility] Pager: [phone] 06/18/2024 3:10 PM    To contact Stroke Continuity provider, please refer to [email or web address]. After hours, contact General Neurology

## 2024-06-18 NOTE — Plan of Care (Signed)
   Problem: Education: Goal: Knowledge of disease or condition will improve Outcome: Progressing

## 2024-06-18 NOTE — Plan of Care (Signed)
°  Problem: Coping: Goal: Will verbalize positive feelings about self Outcome: Progressing Goal: Will identify appropriate support needs Outcome: Progressing   Problem: Clinical Measurements: Goal: Ability to maintain clinical measurements within normal limits will improve Outcome: Progressing Goal: Will remain free from infection Outcome: Progressing Goal: Diagnostic test results will improve Outcome: Progressing Goal: Respiratory complications will improve Outcome: Progressing Goal: Cardiovascular complication will be avoided Outcome: Progressing

## 2024-06-18 NOTE — Consult Note (Signed)
 Physical Medicine and Rehabilitation Consult Reason for Consult:impaired functional mobility Referring Physician: Lama   HPI: Linnette J Dohrmann is a 78 y.o. female with a hx of HTN, prior CVA (residual aphasia), GERD, COPD, FMS, RA who presented on 11/19 after a fall at home. CTH revealed no abnl. CT cervical spine negative. MRI brain revealed acute infarct in the right precentral gyrus and subacute to chronic  infarcts in the left frontoparietal area, right periatrial white matter,  basal ganglia bilaterally. CTA demonstrate ongoing left M2 occlusion but no new LVO. Neurology recommends asa 81 and plavix  75mg  daily for 3 mos then plavix  alone. Loop recorder placed. Pt has been up with therapy and was CGA for sit to dstand and walked 20' min-mod assist. She was min to max assist with basic ADL's yesterday. Pt lives with family, two level home with 1 step to enter. Pt used RW prior to admit with assistance. Also needed assistance for bathing, dressing.     Home: Home Living Family/patient expects to be discharged to:: Private residence Living Arrangements:  (daughter and son in social worker) Available Help at Discharge: Family, Available 24 hours/day Type of Home: House Home Access: Stairs to enter [employer] of Steps: 1 stair to the garage with 1/2 rise to entrance is her best option opposed to front entrance Entrance Stairs-Rails: Right, Left Home Layout: Two level, Able to live on main level with bedroom/bathroom, Full bath on main level, Other (Comment) Alternate Level Stairs-Number of Steps: does not go upstairs or basement Bathroom Shower/Tub: Health Visitor: Standard Bathroom Accessibility: Yes Home Equipment: [employer] (2 wheels), Wheelchair - manual, [place] - single point, BSC/3in1 Additional Comments: confirmed all info with pt again this admission 06/17/24  Lives With: Family, Daughter  Functional History: Prior Function Prior Level of Function :  Needs assist Mobility Comments: reports use of RW and assistance for ambulation, reports she is sedentary ADLs Comments: mostly sponge bathes, does not like the feeling of shower on her body, daughter bathes her, assists with dressing and does all IADLs for pt Functional Status:  Mobility: Bed Mobility Overal bed mobility: Needs Assistance Bed Mobility: Supine to Sit, Sit to Supine Supine to sit: Supervision Sit to supine: Supervision General bed mobility comments: head of stretcher up Transfers Overall transfer level: Needs assistance Equipment used: 1 person hand held assist Transfers: Sit to/from Stand, Bed to chair/wheelchair/BSC Sit to Stand: Min assist Bed to/from chair/wheelchair/BSC transfer type:: Step pivot Step pivot transfers: Min assist General transfer comment: steadying assist Ambulation/Gait Ambulation/Gait assistance: Min assist, Mod assist Gait Distance (Feet): 20 Feet Assistive device: Rolling walker (2 wheels) Gait Pattern/deviations: Decreased step length - right, Decreased step length - left, Decreased stride length, Decreased dorsiflexion - right, Decreased dorsiflexion - left, Knee flexed in stance - left, Knee flexed in stance - right, Antalgic, Trunk flexed, Step-to pattern, Decreased stance time - right, Decreased weight shift to right General Gait Details: Pt with slow, antalgic gait pattern with a flexed trunk and bil knee flexion in stance phase. Verbal and tactile cues provided to extend knees with only momentary success. Pt slides bil feet to advance them when stepping rather than clearing either foot. Pt with x1 posterior LOB needing modA to recover, otherwise minA for balance throughout. Pt limiting distance due to R leg pain Gait velocity: reduced Gait velocity interpretation: <1.31 ft/sec, indicative of household ambulator    ADL: ADL Overall ADL's : Needs assistance/impaired Eating/Feeding: Set up, Bed level Eating/Feeding Details (  indicate cue  type and reason): only observed drinking with straw Grooming: Wash/dry hands, Wash/dry face, Sitting, Supervision/safety Upper Body Bathing: Minimal assistance, Sitting Lower Body Bathing: Maximal assistance, Sit to/from stand Upper Body Dressing : Set up, Sitting Lower Body Dressing: Minimal assistance, Sitting/lateral leans Lower Body Dressing Details (indicate cue type and reason): long sitting, assist for R sock over toes, can perform figure 4 to reach L foot Toilet Transfer: Minimal assistance, Stand-pivot, BSC/3in1 Toileting- Clothing Manipulation and Hygiene: Moderate assistance, Sit to/from stand  Cognition: Cognition Overall Cognitive Status: History of cognitive impairments - at baseline Arousal/Alertness: Awake/alert Orientation Level: Oriented X4 Year: 2025 Day of Week: Correct Attention: Focused, Sustained Focused Attention: Appears intact Sustained Attention: Appears intact Memory: Appears intact Awareness: Appears intact Problem Solving: Impaired Problem Solving Impairment: Functional basic (calculations) Cognition Arousal: Alert Behavior During Therapy: WFL for tasks assessed/performed Overall Cognitive Status: History of cognitive impairments - at baseline   Review of Systems  Unable to perform ROS: Language   Past Medical History:  Diagnosis Date   Acute ischemic left middle cerebral artery (MCA) stroke (HCC) 04/17/2024   Asthma    C. difficile colitis    Cerebrovascular accident (CVA) due to embolism of precerebral artery (HCC) 05/25/2024   Cervical stenosis of spine    Chronic headaches    Chronic neck pain    Colitis due to Clostridium difficile 02/10/2015   Coronary artery disease    Depression    Diverticulosis    Essential hypertension    Fibromyalgia    GERD (gastroesophageal reflux disease)    History of CVA (cerebrovascular accident) 05/06/2024   History of Salmonella gastroenteritis    HNP (herniated nucleus pulposus), lumbar     Hyperlipidemia    IBS (irritable bowel syndrome)    Lung nodule    rheumatoid arthritis    Sepsis (HCC) 05/22/2024   Sepsis due to pneumonia (HCC) 05/06/2024   Past Surgical History:  Procedure Laterality Date   APPENDECTOMY  1965   BIOPSY  08/26/2018   Procedure: BIOPSY;  Surgeon: Golda Claudis PENNER, MD;  Location: [facility];  Service: Endoscopy;;  gastric   BREAST BIOPSY     BREAST REDUCTION SURGERY  1994   CATARACT EXTRACTION W/PHACO Left 06/09/2023   Procedure: CATARACT EXTRACTION PHACO AND INTRAOCULAR LENS PLACEMENT (IOC);  Surgeon: Harrie Agent, MD;  Location: [facility];  Service: Ophthalmology;  Laterality: Left;  CDE 7.03   CATARACT EXTRACTION W/PHACO Right 06/23/2023   Procedure: CATARACT EXTRACTION PHACO AND INTRAOCULAR LENS PLACEMENT (IOC);  Surgeon: Harrie Agent, MD;  Location: [facility];  Service: Ophthalmology;  Laterality: Right;  CDE: 11.55   CHOLECYSTECTOMY  1975   COLONOSCOPY N/A 08/19/2013   Procedure: COLONOSCOPY;  Surgeon: Claudis PENNER Golda, MD;  Location: [facility];  Service: Endoscopy;  Laterality: N/A;  155-moved to 140 Ann to notify pt   COLONOSCOPY N/A 08/26/2018   Procedure: COLONOSCOPY;  Surgeon: Golda Claudis PENNER, MD;  Location: [facility];  Service: Endoscopy;  Laterality: N/A;   ESOPHAGOGASTRODUODENOSCOPY N/A 08/26/2018   Procedure: ESOPHAGOGASTRODUODENOSCOPY (EGD);  Surgeon: Golda Claudis PENNER, MD;  Location: [facility];  Service: Endoscopy;  Laterality: N/A;   fibromyalgia     LOOP RECORDER INSERTION N/A 05/26/2024   Procedure: LOOP RECORDER INSERTION;  Surgeon: Lesia Ozell Barter, PA-C;  Location[facility] CV LAB;  Service: Cardiovascular;  Laterality: N/A;   LUMBAR LAMINECTOMY/DECOMPRESSION MICRODISCECTOMY Bilateral 09/20/2015   Procedure: MICRO LUMBAR DECOMPRESSION L5-S1 BILATERAL    (1 LEVEL);  Surgeon:  Reyes Billing, MD;  Location: [facility];  Service: Orthopedics;  Laterality: Bilateral;   rheumatoid arthritis     Sinus Surgery      TONSILLECTOMY  1968   TRANSESOPHAGEAL ECHOCARDIOGRAM (CATH LAB) N/A 05/25/2024   Procedure: TRANSESOPHAGEAL ECHOCARDIOGRAM;  Surgeon: Lonni Slain, MD;  Location: [facility] INVASIVE CV LAB;  Service: Cardiovascular;  Laterality: N/A;   Family History  Problem Relation Age of Onset   Heart disease Mother    Hypertension Mother    Asthma Mother    Heart disease Father    Stroke Father    Hypertension Father    Stroke Sister    Asthma Sister    Hypertension Brother    Asthma Brother    Asthma Maternal Grandmother    Heart disease Maternal Grandmother    Diabetes Maternal Grandfather    Asthma Maternal Aunt    Colon cancer Neg Hx    Social History:  reports that she quit smoking about 35 years ago. Her smoking use included cigarettes. She started smoking about 45 years ago. She has a 20 pack-year smoking history. She has never used smokeless tobacco. She reports that she does not drink alcohol and does not use drugs. Allergies:  Allergies  Allergen Reactions   Hydromorphone  Anaphylaxis   Sulfa Antibiotics Rash   Medications Prior to Admission  Medication Sig Dispense Refill   acetaminophen  (TYLENOL ) 325 MG tablet Take 1-2 tablets (325-650 mg total) by mouth every 4 (four) hours as needed for mild pain (pain score 1-3). (Patient taking differently: Take 650 mg by mouth daily as needed for mild pain (pain score 1-3) or moderate pain (pain score 4-6).)     albuterol  (VENTOLIN  HFA) 108 (90 Base) MCG/ACT inhaler INHALE 2 PUFFS INTO THE LUNGS EVERY 4 HOURS AS NEEDED FOR WHEEZING OR SHORTNESS OF BREATH 8 g 0   amLODipine  (NORVASC ) 10 MG tablet Take 1 tablet (10 mg total) by mouth daily. 30 tablet 0   aspirin  EC 81 MG tablet Take 1 tablet (81 mg total) by mouth daily. Swallow whole. 30 tablet 12   clonazePAM  (KLONOPIN ) 0.25 MG disintegrating tablet Take 0.25 mg by mouth daily.     clopidogrel  (PLAVIX ) 75 MG tablet Take 1 tablet (75 mg total) by mouth daily. 90 tablet 0   colestipol   (COLESTID ) 1 g tablet Take 1 g by mouth daily.     DULoxetine  (CYMBALTA ) 30 MG capsule Take 1 capsule (30 mg total) by mouth 2 (two) times daily. 60 capsule 0   ferrous sulfate  325 (65 FE) MG EC tablet Take 325 mg by mouth daily.     Fluticasone -Umeclidin-Vilant (TRELEGY ELLIPTA ) 200-62.5-25 MCG/ACT AEPB Inhale 1 puff into the lungs in the morning. 90 each 3   leflunomide  (ARAVA ) 20 MG tablet Take 20 mg by mouth daily.     losartan -hydrochlorothiazide  (HYZAAR) 100-12.5 MG tablet Take 1 tablet by mouth daily.     meclizine  (ANTIVERT ) 12.5 MG tablet Take 12.5 mg by mouth daily.     melatonin 5 MG TABS Take 1 tablet (5 mg total) by mouth at bedtime. 30 tablet 0   metoprolol  succinate (TOPROL  XL) 50 MG 24 hr tablet Take 1 tablet (50 mg total) by mouth daily. 30 tablet 0   pantoprazole  (PROTONIX ) 40 MG tablet Take 1 tablet (40 mg total) by mouth daily. 30 tablet 0   potassium chloride  (KLOR-CON  M) 10 MEQ tablet Take 1 tablet (10 mEq total) by mouth daily.     pregabalin  (LYRICA ) 25 MG capsule Take  25 mg by mouth 2 (two) times daily.     rosuvastatin  (CRESTOR ) 20 MG tablet Take 1 tablet (20 mg total) by mouth daily. 30 tablet 0   traZODone  (DESYREL ) 50 MG tablet Take 0.5 tablets (25 mg total) by mouth at bedtime. 30 tablet 0   benralizumab  (FASENRA ) 30 MG/ML prefilled syringe Inject 1 mL (30 mg total) into the skin every 8 (eight) weeks. (Patient not taking: Reported on 06/17/2024) 1 mL 6   Certolizumab Pegol (CIMZIA [place]) Inject 1 Dose into the skin every 14 (fourteen) days. (Patient not taking: Reported on 06/14/2024)     clindamycin (CLEOCIN T) 1 % lotion Apply 1 Application topically 2 (two) times daily. (Patient not taking: Reported on 06/17/2024)       Blood pressure (!) 154/69, pulse (!) 120, temperature 98 F (36.7 C), temperature source Oral, resp. rate 18, height 5' 3 (1.6 m), weight 59 kg, SpO2 98%. Physical Exam Constitutional:      General: She is not in acute distress. HENT:      Head: Normocephalic and atraumatic.     Right Ear: External ear normal.     Left Ear: External ear normal.     Mouth/Throat:     Mouth: Mucous membranes are moist.  Eyes:     Extraocular Movements: Extraocular movements intact.     Pupils: Pupils are equal, round, and reactive to light.  Cardiovascular:     Rate and Rhythm: Regular rhythm. Tachycardia present.  Pulmonary:     Effort: Pulmonary effort is normal. No respiratory distress.     Breath sounds: No wheezing.  Abdominal:     General: Bowel sounds are normal. There is no distension.     Tenderness: There is no abdominal tenderness.  Musculoskeletal:     Cervical back: Normal range of motion.  Skin:    General: Skin is warm.  Neurological:     Mental Status: She is alert.     Comments: Pt alert. Oriented to name, October, year. Cognitive assessment limited by aphasia which is expressive > receptive. Speaks in small sentences, phrases. Seems to comprehend about 50% depending upon the complexity. Speech is clear. No focal CN signs. Moves all 4's at least 4- to 4/5. Sensed pain in all 4's. Motor/sensory exam a little limited due to aphasia. No abnl resting tone. No ataxia  Psychiatric:        Mood and Affect: Mood normal.     Results for orders placed or performed during the hospital encounter of 06/16/24 (from the past 24 hours)  Urine Culture (for pregnant, neutropenic or urologic patients or patients with an indwelling urinary catheter)     Status: Abnormal   Collection Time: 06/17/24 11:09 AM   Specimen: Urine, Clean Catch  Result Value Ref Range   Specimen Description URINE, CLEAN CATCH    Special Requests      NONE Performed at [facility], [address] [place], [place] [address]    Culture MULTIPLE SPECIES PRESENT, SUGGEST RECOLLECTION (A)    Report Status 06/18/2024 FINAL    EEG adult Result Date: 06/17/2024 Shelton Arlin KIDD, MD     06/17/2024  3:43 AM Patient Name: NAMRATA DANGLER MRN: [id number]  Epilepsy Attending: Arlin KIDD Shelton Referring Physician/Provider: Waddell Karna LABOR, NP Date: 06/17/2024 Duration: 21.35 mins Patient history: 23uo F with episode of staring. EEG to evaluate for seizure Level of alertness: Awake AEDs during EEG study: LEV Technical aspects: This EEG study was done with scalp  electrodes positioned according to the 10-20 International system of electrode placement. Electrical activity was reviewed with band pass filter of 1-70Hz , sensitivity of 7 uV/mm, display speed of 51mm/sec with a 60Hz  notched filter applied as appropriate. EEG data were recorded continuously and digitally stored.  Video monitoring was available and reviewed as appropriate. Description: The posterior dominant rhythm consists of 8 Hz activity of moderate voltage (25-35 uV) seen predominantly in posterior head regions, symmetric and reactive to eye opening and eye closing. Physiologic photic driving was not seen during photic stimulation.  Hyperventilation was  not performed.   IMPRESSION: This study is within normal limits. No seizures or epileptiform discharges were seen throughout the recording. A normal interictal EEG does not exclude the diagnosis of epilepsy. Arlin MALVA Krebs   MR BRAIN WO CONTRAST Result Date: 06/16/2024 EXAM: MRI BRAIN WITHOUT CONTRAST 06/16/2024 02:43:21 PM TECHNIQUE: Multiplanar multisequence MRI of the head/brain was performed without the administration of intravenous contrast. COMPARISON: Head CT 06/16/2024 and MRI 05/23/2024. CLINICAL HISTORY: Stroke, follow up. Recent fall. Aphasia. History of stroke. FINDINGS: BRAIN AND VENTRICLES: A punctate focus of restricted diffusion involving cortex in the right precentral gyrus is new from the prior MRI and consistent with an acute infarct. The cortical and subcortical infarction in the left frontoparietal region has further evolved from the prior MRI with progressive encephalomalacia, and a smaller late subacute to chronic infarct is again  noted in the right periatrial white matter. Restricted diffusion associated with the subcentimeter acute left cerebellar infarct on the prior MRI has resolved. T2 hyperintensities elsewhere in the cerebral white matter bilaterally and in the pons are unchanged and nonspecific but compatible with mild chronic small vessel ischemic disease. Chronic lacunar infarcts are noted in the basal ganglia bilaterally. No intracranial hemorrhage, mass, midline shift, hydrocephalus, or extra axial fluid collection is identified. There is mild cerebral atrophy. Major intracranial vascular flow voids are preserved. ORBITS: Bilateral cataract extraction. SINUSES AND MASTOIDS: Small right mastoid effusion. Clear paranasal sinuses. BONES AND SOFT TISSUES: Normal marrow signal. No acute soft tissue abnormality. IMPRESSION: 1. Punctate acute infarct in the right precentral gyrus. 2. Chronic ischemia with multiple old infarcts as above, including evolving late subacute to chronic bilateral MCA infarcts. Electronically signed by: Dasie Hamburg MD 06/16/2024 03:21 PM EST RP Workstation: [id number]   DG Ankle 2 Views Left Result Date: 06/16/2024 EXAM: 2 VIEW(S) XRAY OF THE LEFT ANKLE 06/16/2024 01:51:00 PM CLINICAL HISTORY: trauma COMPARISON: None available. FINDINGS: BONES AND JOINTS: No acute fracture. No focal osseous lesion. No joint dislocation. SOFT TISSUES: The soft tissues are unremarkable. IMPRESSION: 1. No acute osseous abnormality. Electronically signed by: Lynwood Seip MD 06/16/2024 02:16 PM EST RP Workstation: [id number]   DG Chest Port 1 View Result Date: 06/16/2024 EXAM: 1 VIEW(S) XRAY OF THE CHEST 06/16/2024 01:51:00 PM COMPARISON: 05/22/2024 CLINICAL HISTORY: Trauma FINDINGS: LUNGS AND PLEURA: Stable left basilar scarring. No pleural effusion. No pneumothorax. HEART AND MEDIASTINUM: No acute abnormality of the cardiac and mediastinal silhouettes. BONES AND SOFT TISSUES: No acute osseous abnormality. IMPRESSION: 1. No  acute cardiopulmonary process. Electronically signed by: Lynwood Seip MD 06/16/2024 02:14 PM EST RP Workstation: [id number]   CT CHEST ABDOMEN PELVIS W CONTRAST Result Date: 06/16/2024 CLINICAL DATA:  Syncope with fall hitting back of head and neck. Abdominal pain. EXAM: CT CHEST, ABDOMEN, AND PELVIS WITH CONTRAST TECHNIQUE: Multidetector CT imaging of the chest, abdomen and pelvis was performed following the standard protocol during bolus administration of intravenous contrast. RADIATION DOSE REDUCTION: This exam was  performed according to the departmental dose-optimization program which includes automated exposure control, adjustment of the mA and/or kV according to patient size and/or use of iterative reconstruction technique. CONTRAST:  75mL OMNIPAQUE  IOHEXOL  350 MG/ML SOLN COMPARISON:  CTA chest 05/22/2024 and CT abdomen/pelvis 05/27/2024, CT chest/abdomen/pelvis 09/25/2021 FINDINGS: CT CHEST FINDINGS Cardiovascular: Heart is normal size. Calcification of the mitral valve annulus. Thoracic aorta is normal in caliber without evidence of aneurysm or dissection. Pulmonary arterial system is unremarkable. Remaining vascular structures are normal. Mediastinum/Nodes: No mediastinal or hilar adenopathy. Mediastinal structures are otherwise unremarkable. Lungs/Pleura: Lungs are adequately inflated and demonstrate mild centrilobular emphysematous disease. Stable mild chronic peripheral reticular density over the upper lobes. No acute airspace process or effusion. Airways are normal. 3 mm subpleural nodule over the lateral left lower lobe unchanged from 2023 and therefore considered benign. Musculoskeletal: No acute fracture. CT ABDOMEN PELVIS FINDINGS Hepatobiliary: Prior cholecystectomy. Mild steatosis of the liver without focal mass. Biliary tree is normal. Pancreas: Normal. Spleen: Normal. Adrenals/Urinary Tract: Adrenal glands are normal. Kidneys are normal in size without hydronephrosis or nephrolithiasis. Right  renal cyst unchanged. Bladder and ureters are normal. Stomach/Bowel: Stomach and small bowel are normal. Prior appendectomy. Moderate diverticulosis over the sigmoid colon and distal descending colon. No active inflammation. Mild diverticulosis over the right colon. Vascular/Lymphatic: Minimal calcified plaque over the abdominal aorta which is normal in caliber. Remaining vascular structures are unremarkable. No adenopathy. Reproductive: Uterus and bilateral adnexa are unremarkable. Other: No free fluid or focal inflammatory change. Musculoskeletal: No acute fracture. IMPRESSION: 1. No acute findings in the chest, abdomen or pelvis. 2. Mild centrilobular emphysematous disease. 3. Mild hepatic steatosis. 4. Colonic diverticulosis without active inflammation. 5. Aortic atherosclerosis. Aortic Atherosclerosis (ICD10-I70.0) and Emphysema (ICD10-J43.9). Electronically Signed   By: Toribio Agreste M.D.   On: 06/16/2024 13:51   CT Cervical Spine Wo Contrast Result Date: 06/16/2024 EXAM: CT CERVICAL SPINE WITH CONTRAST 06/16/2024 01:19:54 PM TECHNIQUE: CT of the cervical spine was performed with the administration of 75 mL of iohexol  (OMNIPAQUE ) 350 MG/ML injection. Multiplanar reformatted images are provided for review. Automated exposure control, iterative reconstruction, and/or weight based adjustment of the mA/kV was utilized to reduce the radiation dose to as low as reasonably achievable. COMPARISON: Cervical spine 09/25/2021. CLINICAL HISTORY: Trauma. Mechanical fall secondary to dizziness. FINDINGS: CERVICAL SPINE: BONES AND ALIGNMENT: No acute fracture or traumatic malalignment. 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 Anterior osteophytes at C5-C6 and C6-C7 are stable. Uncovertebral spurring contributes to moderate left foraminal stenosis at C3-C4, C4-C5, and C5-C6. SOFT TISSUES: No prevertebral soft tissue swelling. Atherosclerotic calcifications are present in the  carotid bifurcations bilaterally without significant stenosis. IMPRESSION: 1. No acute cervical spine injury related to the reported trauma. 2. 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 3. Moderate left neural foraminal stenosis at C3-4, C4-5, and C5-6. Electronically signed by: Lonni Necessary MD 06/16/2024 01:33 PM EST RP Workstation: [id number]   CT HEAD CODE STROKE WO CONTRAST (LKW 0-4.5h, LVO 0-24h) Result Date: 06/16/2024 EXAM: CT HEAD WITHOUT 06/16/2024 12:52:32 PM TECHNIQUE: CT of the head was performed without the administration of intravenous contrast. Automated exposure control, iterative reconstruction, and/or weight based adjustment of the mA/kV was utilized to reduce the radiation dose to as low as reasonably achievable. COMPARISON: 05/27/2024 CLINICAL HISTORY: Neuro deficit, acute, stroke suspected FINDINGS: BRAIN AND VENTRICLES: No acute intracranial hemorrhage. No mass effect or midline shift. No extra-axial fluid collection. No evidence of acute infarct. Chronic  left parietal lobe infarct. Scattered hypoattenuating foci in supratentorial white matter, nonspecific but most commonly related to chronic microangiopathic changes. No hydrocephalus. ORBITS: No acute abnormality. SINUSES AND MASTOIDS: No acute abnormality. SOFT TISSUES AND SKULL: No acute skull fracture. No acute soft tissue abnormality. LIMITATIONS/ARTIFACTS: Motion artifact limiting evaluation. IMPRESSION: 1. No acute intracranial abnormality. 2. Chronic left parietal lobe infarct. 3. Motion artifact limiting evaluation. 4. These results were communicated to Dr. Matthews at 1:04 PM on 06/16/2024 by secure text page via the [facility] messaging system. Electronically signed by: Lonni Necessary MD 06/16/2024 01:05 PM EST RP Workstation: [id number]    Assessment/Plan: Diagnosis: 65 female with acute right precentral gyrus infarct in the setting of other subacute/chronic, likely cardioembolic infarcts.   Does the need for close, 24 hr/day medical supervision in concert with the patient's rehab needs make it unreasonable for this patient to be served in a less intensive setting? Yes Co-Morbidities requiring supervision/potential complications:  -HTN -RA,FMS -COPD -CAD, CHF Due to bladder management, bowel management, safety, skin/wound care, disease management, medication administration, and patient education, does the patient require 24 hr/day rehab nursing? Yes Does the patient require coordinated care of a physician, rehab nurse, therapy disciplines of PT, OT, SLP to address physical and functional deficits in the context of the above medical diagnosis(es)? Yes Addressing deficits in the  following areas: balance, endurance, locomotion, strength, transferring, bowel/bladder control, bathing, dressing, feeding, grooming, toileting, cognition, language, and psychosocial support Can the patient actively participate in an intensive therapy program of at least 3 hrs of therapy per day at least 5 days per week? Yes The potential for patient to make measurable gains while on inpatient rehab is excellent Anticipated functional outcomes upon discharge from inpatient rehab are supervision  with PT, supervision and min assist with OT, min assist and mod assist with SLP. Estimated rehab length of stay to reach the above functional goals is: 7 days Anticipated discharge destination: Home Overall Rehab/Functional Prognosis: excellent  POST ACUTE RECOMMENDATIONS: This patient's condition is appropriate for continued rehabilitative care in the following setting: CIR Patient has agreed to participate in recommended program. Yes Note that insurance prior authorization may be required for reimbursement for recommended care.  Comment: I'm unclear how much assistance she was needing prior to this admit. Rehab admissions coordinator will follow up with daughter and find out about her prior functional levels,  potential goals for inpatient rehab, etc.    I have personally performed a face to face diagnostic evaluation of this patient. Additionally, I have examined the patient's medical record including any pertinent labs and radiographic images.    Thanks,  Arthea ONEIDA Gunther, MD 06/18/2024

## 2024-06-18 NOTE — Progress Notes (Signed)
   Inpatient Rehabilitation Admissions Coordinator   Met with patient at bedside for rehab assessment and spoke with her daughter by phone. We discussed goals and expectations of a possible CIR admit. Recently at CIR 9/25. Daughter prefers CIR prior to discharge home and states she is a lot weaker and has fallen twice at home recently. Please see Dr Naaman consult.  I will begin insurance Auth with [facility] for possible CIR admit pending approval. Please call me with any questions.   Heron Leavell, RN, MSN Rehab Admissions Coordinator [phone]

## 2024-06-18 NOTE — TOC CAGE-AID Note (Signed)
 Transition of Care [facility]) - CAGE-AID Screening   Patient Details  Name: Betty Golden MRN: [id number] Date of Birth: 1946/06/10  Transition of Care [facility]) CM/SW Contact:    Landry DELENA Senters, RN Phone Number: 06/18/2024, 2:08 PM   Clinical Narrative:  Patient denies history of drug or alcohol use.  CAGE-AID Screening:    Have You Ever Felt You Ought to Cut Down on Your Drinking or Drug Use?: No Have People Annoyed You By Critizing Your Drinking Or Drug Use?: No Have You Felt Bad Or Guilty About Your Drinking Or Drug Use?: No Have You Ever Had a Drink or Used Drugs First Thing In The Morning to Steady Your Nerves or to Get Rid of a Hangover?: No CAGE-AID Score: 0

## 2024-06-18 NOTE — Progress Notes (Signed)
 [facility]  PROGRESS NOTE  Betty Golden FMW:[id number] DOB: 11-Mar-1946 DOA: 06/16/2024 PCP: Irven Ozell DEL, MD   Brief HPI:   78 y.o. female with medical history significant of hypertension, hyperlipidemia, CVA, residual expressive aphasia, GERD, depression, rheumatoid arthritis, asthma, COPD, anemia, spinal stenosis, fibromyalgia presenting after a fall at home  Patient reportedly got dizzy and had a fall last night at home. She did hit the back of her neck and her back. Noted to have some abdominal pain and leg pain afterwards. Has history of prior CVA and incidental repeat CVA noted last month. And has slurred speech/expressive aphasia at baseline.  CT head showed no acute abnormality.  CT C-spine showed no acute abnormality.   CT chest abdomen pelvis showed no acute abnormality but showed chronic emphysema, hepatic steatosis, diverticulosis without diverticulitis.   MRI brain showed acute infarct and chronic changes from prior infarct.   While in triage, RN drew blood and family was present at the beside when patient suddenly stopped responding to them and was staring off not responding   Neurology was consulted   Assessment/Plan:   Status post fall/acute CVA/questionable seizure - Presented with fall at home; found to have stroke on MRI brain - Had seizure-like episode in the ED - Neurology consulted, started on Keppra  - Stroke workup started; EEG negative for epileptiform discharges - Continue aspirin  and Plavix  - Continue rosuvastatin  - Follow neurology recommendations - Neurology recommends aspirin  and Plavix  for 3 months and then Plavix  alone - Plan to go to CIR - Had loop recorder placed on 10/29, interrogation pending   Lactic acidosis/leukocytosis -Presumed reactive - Lactic acid is down from 4.3-1.7 - WBC is down from 18,000-13.5 - UA is abnormal, urine culture was obtained; grew multiple species - Started on ceftriaxone ; will discontinue  ceftriaxone    Hypertension - Blood pressure is mildly elevated -Continue permissive hypertension  Hyperlipidemia - Continue statin  Depression - Continue duloxetine , trazodone , clonazepam   Rheumatoid arthritis Continue Areva  Spinal stenosis/fibromyalgia - Continue Lyrica   Anemia - Hemoglobin is stable     DVT prophylaxis: Lovenox   Medications     aspirin  EC  81 mg Oral Daily   budesonide -glycopyrrolate -formoterol   2 puff Inhalation BID   clopidogrel   75 mg Oral Daily   DULoxetine   30 mg Oral BID   enoxaparin  (LOVENOX ) injection  40 mg Subcutaneous Q24H   leflunomide   20 mg Oral Daily   levETIRAcetam   500 mg Oral BID   pantoprazole   40 mg Oral Daily   rosuvastatin   20 mg Oral Daily   traZODone   25 mg Oral QHS     Data Reviewed:   CBG:  Recent Labs  Lab 06/16/24 1244  GLUCAP 128*    SpO2: 97 %    Vitals:   06/18/24 0346 06/18/24 0808 06/18/24 0919 06/18/24 1154  BP: (!) 157/70 (!) 154/69  (!) 140/61  Pulse:  (!) 120  (!) 114  Resp: 20 18  18   Temp:  98 F (36.7 C)  99.3 F (37.4 C)  TempSrc:  Oral  Oral  SpO2:  100% 98% 97%  Weight:      Height:          Data Reviewed:  Basic Metabolic Panel: Recent Labs  Lab 06/16/24 1234 06/16/24 1244 06/17/24 0504  NA 140 141 143  K 3.6 3.5 4.1  CL 104 109 109  CO2 17*  --  17*  GLUCOSE 126* 99 85  BUN 13 14 9   CREATININE 1.12* 0.90  0.84  CALCIUM  10.2  --  9.4    CBC: Recent Labs  Lab 06/16/24 1234 06/16/24 1244 06/17/24 0504  WBC 18.1*  --  13.5*  NEUTROABS 12.5*  --   --   HGB 12.8 13.6 11.7*  HCT 39.2 40.0 37.3  MCV 86.7  --  88.6  PLT 194  --  145*    LFT Recent Labs  Lab 06/16/24 1234 06/17/24 0504  AST 28 21  ALT 17 15  ALKPHOS 61 51  BILITOT 0.6 1.0  PROT 7.6 6.0*  ALBUMIN 3.8 3.0*     Antibiotics: Anti-infectives (From admission, onward)    Start     Dose/Rate Route Frequency Ordered Stop   06/17/24 1230  cefTRIAXone  (ROCEPHIN ) 1 g in sodium chloride  0.9  % 100 mL IVPB        1 g 200 mL/hr over 30 Minutes Intravenous Every 24 hours 06/17/24 1221          CONSULTS Lovenox   Code Status: Full code  Family Communication: No family at bedside     Subjective   Denies any complaints   Objective    Physical Examination:  Appears in no acute distress S1-S2, regular, no murmur auscultated Abdomen is soft, nontender Extremities no edema Neuro-alert, oriented x 3, no focal deficit noted              Betty Golden   [facility] If 7PM-7AM, please contact night-coverage at [email or web address], Office  [phone]   06/18/2024, 2:29 PM  LOS: 1 day

## 2024-06-18 NOTE — TOC Initial Note (Signed)
 Transition of Care [facility]) - Initial/Assessment Note    Patient Details  Name: Betty Golden MRN: [id number] Date of Birth: 21-Aug-1945  Transition of Care [facility]) CM/SW Contact:    Landry DELENA Senters, RN Phone Number: 06/18/2024, 2:10 PM  Clinical Narrative:                 CC: medical history significant of hypertension, hyperlipidemia, CVA, residual expressive aphasia, GERD, depression, rheumatoid arthritis, asthma, COPD, anemia, spinal stenosis, fibromyalgia presenting after a fall at home.   Patient lives at home with daughter, SIL, and grandchildren. Patient reports good support, daughter manages medications and does driving, daughter is able to transport patient at d/c and be with patient at home.   Therapy recommendation for rehab. Patient given choice and reports she is interested in CIR. CIR to eval.   CM will continue to follow.   Expected Discharge Plan: IP Rehab Facility Barriers to Discharge: Continued Medical Work up   Patient Goals and CMS Choice   CMS Medicare.gov Compare Post Acute Care list provided to:: Patient Choice offered to / list presented to : Patient      Expected Discharge Plan and Services       Living arrangements for the past 2 months: Single Family Home                                      Prior Living Arrangements/Services Living arrangements for the past 2 months: Single Family Home Lives with:: Self, Adult Children Patient language and need for interpreter reviewed:: Yes Do you feel safe going back to the place where you live?: Yes      Need for Family Participation in Patient Care: Yes (Comment) Care giver support system in place?: Yes (comment) Current home services: DME (walker, shower seat, BSC) Criminal Activity/Legal Involvement Pertinent to Current Situation/Hospitalization: No - Comment as needed  Activities of Daily Living      Permission Sought/Granted                  Emotional Assessment Appearance::  Developmentally appropriate Attitude/Demeanor/Rapport: Engaged Affect (typically observed): Calm Orientation: : Oriented to Self, Oriented to Place, Oriented to  Time, Oriented to Situation Alcohol / Substance Use: Not Applicable Psych Involvement: No (comment)  Admission diagnosis:  Stroke-like symptoms [R29.90] Acute CVA (cerebrovascular accident) [facility]) [I63.9] Patient Active Problem List   Diagnosis Date Noted   Anemia 06/16/2024   Depression 06/16/2024   Irritable bowel 06/16/2024   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   Acute CVA (cerebrovascular accident) (HCC) 06/16/2024   Aortic atherosclerosis 06/14/2024   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 - [place], [place] - [address] AT [facility] OF [address] [facility] [address] Phone: [phone] Fax: [phone]     Social Drivers of Health (SDOH) Social  History: SDOH Screenings   Food Insecurity: Patient Unable To Answer (06/18/2024)  Housing: Unknown (06/18/2024)  Transportation Needs: Patient Unable To Answer (06/18/2024)  Utilities: Patient Unable To Answer (06/18/2024)  Financial Resource Strain: Low Risk  (04/04/2019)  Physical Activity: Inactive (04/04/2019)  Social Connections: Unknown (06/18/2024)  Recent Concern: Social Connections - Moderately Isolated (05/23/2024)   Stress: No Stress Concern Present (04/04/2019)  Tobacco Use: Medium Risk (06/14/2024)   SDOH Interventions:     Readmission Risk Interventions    05/24/2024   11:49 AM  Readmission Risk Prevention Plan  Transportation Screening Complete  PCP or Specialist Appt within 3-5 Days Complete  HRI or Home Care Consult Complete  Social Work Consult for Recovery Care Planning/Counseling Complete  Palliative Care Screening Complete  Medication Review Oceanographer) Referral to Pharmacy

## 2024-06-18 NOTE — PMR Pre-admission (Signed)
 PMR Admission Coordinator Pre-Admission Assessment  Patient: Betty Golden is an 78 y.o., female MRN: [id number] DOB: [birth year] Height: 5' 3 (160 cm) Weight: 59 kg  Insurance Information HMO:     PPO:      PCP:      IPA:      80/20:      OTHER: [employer] med adv RPPO PRIMARY: Blue Medicare      Policy#: [id number]      Subscriber: pt CM Name: Abundio  [id number]      Phone#:  [phone]     Fax#: [phone] Pre-Cert#: [id number]  auth for CIR from *** with *** for admit *** with next review date ***.  Updates due to *** at fax listed above.        Employer:  Benefits:  Phone #: [phone]     Name: 11/21 Eff. Date: 07/29/2014     Deduct: $500/met      Out of Pocket Max: $3000 met      Life Max:  CIR: 1005      SNF: 1005 100 days Outpatient: 100%     Co-Pay:  Home Health: 100%      Co-Pay:  DME: 100%     Co-Pay:  Providers: in network  SECONDARY: none      Policy#:      Phone#:   Artist:       Phone#:   The Data Processing Manager" for patients in Inpatient Rehabilitation Facilities with attached "Privacy Act Statement-Health Care Records" was provided and verbally reviewed with: Family  Emergency Contact Information Contact Information     Name Relation Home Work Mobile   Maxwell,Nicole Daughter   [phone]   BRYAN NORMAN Rom   [phone]   Bryan Denece Rom [phone]  [phone]      Other Contacts   None on File    Current Medical History  Patient Admitting Diagnosis: CVA  History of Present Illness: 78 yo female with 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 Brief unresponsiveness and staring off in the ER.   MRI showed punctate acute infarct of R precentral gyrus w/ chronic ischemia w/ multiple old infarcts including evolving late subacute/chronic b/l MCA infarcts. LOOP recorder  placed 10/29 and to be interrogated. EEG is within normal limits. NO seizures or epileptiform discharges were seen throughout the recording. VTE prophylaxis. TO continue ASA and Clopidogrel  daily for 3 months.  HTN meds with norvasc , losartan -hydrochlorothiazide , metoprolol . LDL 25 with home meds Crestor  resumed.   Complete NIHSS TOTAL: 2  Patient's medical record from [facility] has been reviewed by the rehabilitation admission coordinator and physician.  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 difficile colitis    Cerebrovascular accident (CVA) due to embolism of precerebral artery (HCC) 05/25/2024   Cervical stenosis of spine    Chronic headaches    Chronic neck pain    Colitis due to Clostridium difficile 02/10/2015   Coronary artery disease    Depression    Diverticulosis    Essential hypertension    Fibromyalgia    GERD (gastroesophageal reflux disease)    History of CVA (cerebrovascular accident) 05/06/2024   History of Salmonella gastroenteritis    HNP (herniated nucleus pulposus), lumbar    Hyperlipidemia    IBS (  irritable bowel syndrome)    Lung nodule    rheumatoid arthritis    Sepsis (HCC) 05/22/2024   Sepsis due to pneumonia (HCC) 05/06/2024   Has the patient had major surgery during 100 days prior to admission? No  Family History   family history includes Asthma in her brother, maternal aunt, maternal grandmother, mother, and sister; Diabetes in her maternal grandfather; Heart disease in her father, maternal grandmother, and mother; Hypertension in her brother, father, and mother; Stroke in her father and sister.  Current Medications  Current Facility-Administered Medications:    acetaminophen  (TYLENOL ) tablet 650 mg, 650 mg, Oral, Q4H PRN, 650 mg at 06/18/24 0042 **OR** acetaminophen  (TYLENOL ) 160 MG/5ML solution 650 mg, 650 mg, Per Tube, Q4H PRN  **OR** acetaminophen  (TYLENOL ) suppository 650 mg, 650 mg, Rectal, Q4H PRN, Melvin, Alexander B, MD   albuterol  (PROVENTIL ) (2.5 MG/3ML) 0.083% nebulizer solution 2.5 mg, 2.5 mg, Nebulization, Q4H PRN, Melvin, Alexander B, MD   aspirin  EC tablet 81 mg, 81 mg, Oral, Daily, Melvin, Alexander B, MD, 81 mg at 06/18/24 1200   budesonide -glycopyrrolate -formoterol  (BREZTRI ) 160-9-4.8 MCG/ACT inhaler 2 puff, 2 puff, Inhalation, BID, Melvin, Alexander B, MD, 2 puff at 06/18/24 9081   clonazepam  (KLONOPIN ) disintegrating tablet 0.25 mg, 0.25 mg, Oral, Daily PRN, Melvin, Alexander B, MD, 0.25 mg at 06/17/24 1739   clopidogrel  (PLAVIX ) tablet 75 mg, 75 mg, Oral, Daily, Melvin, Alexander B, MD, 75 mg at 06/18/24 1200   DULoxetine  (CYMBALTA ) DR capsule 30 mg, 30 mg, Oral, BID, Melvin, Alexander B, MD, 30 mg at 06/18/24 1159   enoxaparin  (LOVENOX ) injection 40 mg, 40 mg, Subcutaneous, Q24H, Melvin, Alexander B, MD, 40 mg at 06/17/24 1739   leflunomide  (ARAVA ) tablet 20 mg, 20 mg, Oral, Daily, Melvin, Alexander B, MD, 20 mg at 06/18/24 1159   [COMPLETED] levETIRAcetam  (KEPPRA ) undiluted injection 1,500 mg, 1,500 mg, Intravenous, Once, 1,500 mg at 06/16/24 1831 **FOLLOWED BY** levETIRAcetam  (KEPPRA ) tablet 500 mg, 500 mg, Oral, BID, Matthews Barnacle M, MD, 500 mg at 06/18/24 1200   melatonin tablet 5 mg, 5 mg, Oral, QHS PRN, Melvin, Alexander B, MD, 5 mg at 06/17/24 2100   ondansetron  (ZOFRAN ) injection 4 mg, 4 mg, Intravenous, Q6H PRN, Drusilla Sabas RAMAN, MD, 4 mg at 06/18/24 9056   oxyCODONE  (Oxy IR/ROXICODONE ) immediate release tablet 5 mg, 5 mg, Oral, Q6H PRN, Drusilla, Gagan S, MD, 5 mg at 06/18/24 0410   pantoprazole  (PROTONIX ) EC tablet 40 mg, 40 mg, Oral, Daily, Melvin, Alexander B, MD, 40 mg at 06/18/24 1159   rosuvastatin  (CRESTOR ) tablet 20 mg, 20 mg, Oral, Daily, Melvin, Alexander B, MD, 20 mg at 06/18/24 1159   senna-docusate (Senokot-S) tablet 1 tablet, 1 tablet, Oral, QHS PRN, Melvin, Alexander B, MD   traZODone   (DESYREL ) tablet 25 mg, 25 mg, Oral, QHS, Melvin, Alexander B, MD, 25 mg at 06/17/24 2100  Patients Current Diet:  Diet Order             Diet regular Fluid consistency: Thin  Diet effective now                  Precautions / Restrictions Precautions Precautions: Fall Precaution/Restrictions Comments: aphasia at baseline, watch HR Restrictions Weight Bearing Restrictions Per Provider Order: No   Has the patient had 2 or more falls or a fall with injury in the past year? No  Prior Activity Level Limited Community (1-2x/wk): Mod I with RW; dtr assists with adls  Prior Functional Level Self Care: Did the patient need  help bathing, dressing, using the toilet or eating? Needed some help  Indoor Mobility: Did the patient need assistance with walking from room to room (with or without device)? Independent  Stairs: Did the patient need assistance with internal or external stairs (with or without device)? Needed some help  Functional Cognition: Did the patient need help planning regular tasks such as shopping or remembering to take medications? Needed some help  Patient Information Are you of Hispanic, Latino/a,or Spanish origin?: A. No, not of Hispanic, Latino/a, or Spanish origin What is your race?: Z. None of the above Do you need or want an interpreter to communicate with a doctor or health care staff?: 0. No Patient information obtained via proxy : yes, per daughter  Patient's Response To:  Health Literacy and Transportation Is the patient able to respond to health literacy and transportation needs?: No Health Literacy - How often do you need to have someone help you when you read instructions, pamphlets, or other written material from your doctor or pharmacy?: Patient unable to respond In the past 12 months, has lack of transportation kept you from medical appointments or from getting medications?: No (per dtr) In the past 12 months, has lack of transportation kept you from  meetings, work, or from getting things needed for daily living?: No (per dtr) Primary School Teacher and Transportation obtained via proxy: yes  Journalist, Newspaper / Equipment Home Equipment: Agricultural Consultant (2 wheels), Wheelchair - manual, [place] - single point, BSC/3in1  Prior Device Use: Indicate devices/aids used by the patient prior to current illness, exacerbation or injury? Walker  Current Functional Level Cognition  Arousal/Alertness: Awake/alert Overall Cognitive Status: History of cognitive impairments - at baseline Orientation Level: Oriented X4 Attention: Focused, Sustained Focused Attention: Appears intact Sustained Attention: Appears intact Memory: Appears intact Awareness: Appears intact Problem Solving: Impaired Problem Solving Impairment: Functional basic (calculations)    Extremity Assessment (includes Sensation/Coordination)  Upper Extremity Assessment: Right hand dominant, RUE deficits/detail, LUE deficits/detail RUE Deficits / Details: 3+/5 shoulder, 4/5 elbow, gross graspt 4-/5 LUE Deficits / Details: 4-/5 shoulder, 4/5 elbow to hand  Lower Extremity Assessment: Defer to PT evaluation RLE Deficits / Details: MMT scores of 3+ hip flexion, 4+ knee extension, 4+ ankle dorsiflexion; noted more weakness functionally with pt flexing at bil knees when standing; denied numbness/tingling bil; pain with palpation anteriorly and posteriorly at lower leg, but no obvious deformities, MD aware and ordered pain meds LLE Deficits / Details: MMT scores of 3+ hip flexion, 4+ knee extension, 4+ ankle dorsiflexion; noted more weakness functionally with pt flexing at bil knees when standing; denied numbness/tingling bil    ADLs  Overall ADL's : Needs assistance/impaired Eating/Feeding: Set up, Bed level Eating/Feeding Details (indicate cue type and reason): only observed drinking with straw Grooming: Wash/dry hands, Wash/dry face, Sitting, Supervision/safety Upper Body Bathing: Minimal  assistance, Sitting Lower Body Bathing: Maximal assistance, Sit to/from stand Upper Body Dressing : Set up, Sitting Lower Body Dressing: Minimal assistance, Sitting/lateral leans Lower Body Dressing Details (indicate cue type and reason): long sitting, assist for R sock over toes, can perform figure 4 to reach L foot Toilet Transfer: Minimal assistance, Stand-pivot, BSC/3in1 Toileting- Clothing Manipulation and Hygiene: Moderate assistance, Sit to/from stand    Mobility  Overal bed mobility: Needs Assistance Bed Mobility: Supine to Sit, Sit to Supine Supine to sit: Supervision Sit to supine: Supervision General bed mobility comments: for safety    Transfers  Overall transfer level: Needs assistance Equipment used: Rolling walker (2 wheels)  Transfers: Sit to/from Stand, Bed to chair/wheelchair/BSC Sit to Stand: Min assist Bed to/from chair/wheelchair/BSC transfer type:: Step pivot Step pivot transfers: Min assist General transfer comment: MinA for steadying assist, occasional need for slight boost-up. Repetitive cues for hand placement with pt unable to replicate without assist    Ambulation / Gait / Stairs / Wheelchair Mobility  Ambulation/Gait Ambulation/Gait assistance: Editor, Commissioning (Feet): 30 Feet Assistive device: Rolling walker (2 wheels) Gait Pattern/deviations: Decreased step length - right, Decreased step length - left, Decreased stride length, Decreased dorsiflexion - right, Decreased dorsiflexion - left, Knee flexed in stance - right, Antalgic, Trunk flexed, Step-to pattern, Decreased stance time - right, Decreased weight shift to right General Gait Details: Antalgic gait pattern with R knee instability. No overt buckling, however, poor quad contaction with full weight bearing. Gait velocity: reduced Gait velocity interpretation: <1.31 ft/sec, indicative of household ambulator    Posture / Balance Dynamic Sitting Balance Sitting balance - Comments: static  sitting EOB with CGA for safety Balance Overall balance assessment: Needs assistance Sitting-balance support: No upper extremity supported, Feet supported Sitting balance-Leahy Scale: Good Sitting balance - Comments: static sitting EOB with CGA for safety Standing balance support: Bilateral upper extremity supported, During functional activity, Reliant on assistive device for balance Standing balance-Leahy Scale: Poor Standing balance comment: reliant on RW and external assist High Level Balance Comments: Bx10 toe taps, Bx5 step over obstacle    Special considerations/life events  Recent CIR admit 9/[address] placed 10/25   Previous Home Environment  Living Arrangements:  (daughter and son in social worker)  Lives With: Family, Daughter Available Help at Discharge: Family, Available 24 hours/day Type of Home: House Home Layout: Two level, Able to live on main level with bedroom/bathroom, Full bath on main level, Other (Comment) Alternate Level Stairs-Number of Steps: does not go upstairs or basement Home Access: Stairs to enter Entrance Stairs-Rails: Right, Left Entrance Stairs-Number of Steps: 1 stair to the garage with 1/2 rise to entrance is her best option opposed to front entrance Bathroom Shower/Tub: Health Visitor: Standard Bathroom Accessibility: Yes How Accessible: Accessible via walker Home Care Services: Yes Home Care Agency (if known): [facility] after d/c from CIR 9/25 Additional Comments: confirmed all info with pt again this admission 06/17/24  Discharge Living Setting Plans for Discharge Living Setting: Lives with (comment), House (daughter and her family) Type of Home at Discharge: House Discharge Home Layout: Two level, Able to live on main level with bedroom/bathroom Alternate Level Stairs-Rails: Right, Left Discharge Home Access: Stairs to enter Entrance Stairs-Rails: None Entrance Stairs-Number of Steps: 1 stair from the garage Discharge Bathroom  Shower/Tub: Walk-in shower Discharge Bathroom Toilet: Standard Discharge Bathroom Accessibility: Yes How Accessible: Accessible via walker Does the patient have any problems obtaining your medications?: No  Social/Family/Support Systems Patient Roles: Parent Contact Information: daughter Anticipated Caregiver: daughter Anticipated Caregiver's Contact Information: see contacts Ability/Limitations of Caregiver: no limitations Caregiver Availability: 24/7 Discharge Plan Discussed with Primary Caregiver: Yes Is Caregiver In Agreement with Plan?: Yes Does Caregiver/Family have Issues with Lodging/Transportation while Pt is in Rehab?: No  Goals Patient/Family Goal for Rehab: supervision PT, OT and SLP Expected length of stay: ELOS 10 to 14 days Pt/Family Agrees to Admission and willing to participate: Yes Program Orientation Provided & Reviewed with Pt/Caregiver Including Roles  & Responsibilities: Yes  Decrease burden of Care through IP rehab admission: n/a  Possible need for SNF placement upon discharge: not anticipated  Patient Condition: This patient's condition remains  as documented in the consult dated 06/18/24, in which the Rehabilitation Physician determined and documented that the patient's condition is appropriate for intensive rehabilitative care in an inpatient rehabilitation facility. Will admit to inpatient rehab today.  Preadmission Screen Completed By:  Alison Heron Lot RN MSN 06/18/2024 4:27 PM ______________________________________________________________________   Discussed status with Dr. PIERRETTE on *** at *** and received approval for admission today.  Admission Coordinator:  Alison Heron Lot, RN MSN time PIERRETTEPattricia ***   Assessment/Plan: Diagnosis: *** Does the need for close, 24 hr/day Medical supervision in concert with the patient's rehab needs make it unreasonable for this patient to be served in a less intensive setting?  {yes_no_potentially:3041433} Co-Morbidities requiring supervision/potential complications: *** Due to {due un:6958565}, does the patient require 24 hr/day rehab nursing? {yes_no_potentially:3041433} Does the patient require coordinated care of a physician, rehab nurse, PT, OT, and SLP to address physical and functional deficits in the context of the above medical diagnosis(es)? {yes_no_potentially:3041433} Addressing deficits in the following areas: {deficits:3041436} Can the patient actively participate in an intensive therapy program of at least 3 hrs of therapy 5 days a week? {yes_no_potentially:3041433} The potential for patient to make measurable gains while on inpatient rehab is {potential:3041437} Anticipated functional outcomes upon discharge from inpatient rehab: {functional outcomes:304600100} PT, {functional outcomes:304600100} OT, {functional outcomes:304600100} SLP Estimated rehab length of stay to reach the above functional goals is: *** Anticipated discharge destination: {anticipated dc setting:21604} 10. Overall Rehab/Functional Prognosis: {potential:3041437}   MD Signature: ***

## 2024-06-19 DIAGNOSIS — R2990 Unspecified symptoms and signs involving the nervous system: Secondary | ICD-10-CM | POA: Diagnosis not present

## 2024-06-19 DIAGNOSIS — R4701 Aphasia: Secondary | ICD-10-CM | POA: Diagnosis not present

## 2024-06-19 DIAGNOSIS — I639 Cerebral infarction, unspecified: Secondary | ICD-10-CM | POA: Diagnosis not present

## 2024-06-19 DIAGNOSIS — J410 Simple chronic bronchitis: Secondary | ICD-10-CM | POA: Diagnosis not present

## 2024-06-19 NOTE — Plan of Care (Signed)

## 2024-06-19 NOTE — Progress Notes (Signed)
 Physical Therapy Treatment Patient Details Name: Betty Golden MRN: [id number] DOB: 13-Dec-1945 Today's Date: 06/19/2024   History of Present Illness Pt is a 78 y.o. female who presented 06/16/24 after getting dizzy and falling. 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 PMH: CVA with residual expressive aphasia and R sided weakness, CAD, depression, HTN, hx of C diff colitis, HLD, RA, asthma, COPD, anemia, spinal stenosis, fibromyalgia    PT Comments  Pt resting in bed on arrival, agreeable to session and demonstrating slow but steady progress towards acute goals. Pt continues to be limited in safe mobility by decreased activity tolerance, with HR up to 144bpm with activity, impaired balance/postural reactions, RLE pain, and global weakness. Pt requiring grossly supervision to CGA, and min A for transfers sit<>stand and gait with RW for support. Pt requiring x2 seated rest breaks due to fatigue. Current plan remains appropriate to address deficits and maximize functional independence and decrease caregiver burden. Pt continues to benefit from skilled PT services to progress toward functional mobility goals.     If plan is discharge home, recommend the following: A lot of help with walking and/or transfers;A lot of help with bathing/dressing/bathroom;Assistance with cooking/housework;Direct supervision/assist for medications management;Direct supervision/assist for financial management;Assist for transportation;Help with stairs or ramp for entrance;Supervision due to cognitive status   Can travel by private vehicle        Equipment Recommendations  None recommended by PT    Recommendations for Other Services       Precautions / Restrictions Precautions Precautions: Fall Recall of Precautions/Restrictions: Impaired Precaution/Restrictions Comments: aphasia at baseline, watch HR Restrictions Weight Bearing  Restrictions Per Provider Order: No     Mobility  Bed Mobility Overal bed mobility: Needs Assistance Bed Mobility: Supine to Sit, Sit to Supine     Supine to sit: Supervision Sit to supine: Supervision   General bed mobility comments: for safety    Transfers Overall transfer level: Needs assistance Equipment used: Rolling walker (2 wheels) Transfers: Sit to/from Stand, Bed to chair/wheelchair/BSC Sit to Stand: Min assist           General transfer comment: MinA for steadying assist from EOB, low commode, and standard chair with armrests    Ambulation/Gait Ambulation/Gait assistance: Min assist Gait Distance (Feet): 18 Feet (+ 6' +12') Assistive device: Rolling walker (2 wheels) Gait Pattern/deviations: Decreased step length - right, Decreased step length - left, Decreased stride length, Decreased dorsiflexion - right, Decreased dorsiflexion - left, Knee flexed in stance - right, Antalgic, Trunk flexed, Step-to pattern, Decreased stance time - right, Decreased weight shift to right Gait velocity: reduced     General Gait Details: Antalgic gait pattern with R knee instability. No overt buckling, pt requiring seated rest due to fatigue after washing hands up at sink   Stairs             Wheelchair Mobility     Tilt Bed    Modified Rankin (Stroke Patients Only) Modified Rankin (Stroke Patients Only) Pre-Morbid Rankin Score: Moderate disability Modified Rankin: Moderately severe disability     Balance Overall balance assessment: Needs assistance Sitting-balance support: No upper extremity supported, Feet supported Sitting balance-Leahy Scale: Good Sitting balance - Comments: static sitting EOB with CGA for safety   Standing balance support: Bilateral upper extremity supported, During functional activity, Reliant on assistive device for balance Standing balance-Leahy Scale: Poor Standing balance comment: reliant on RW and external assist  Communication Communication Communication: Impaired Factors Affecting Communication: Difficulty expressing self  Cognition Arousal: Alert Behavior During Therapy: WFL for tasks assessed/performed   PT - Cognitive impairments: No family/caregiver present to determine baseline, Awareness, Memory, Attention, Sequencing, Problem solving, Safety/Judgement Difficult to assess due to: Impaired communication                     PT - Cognition Comments: Hx of aphasia. Increased difficulty comprehending instructions with multimodal cueing necessary. Following commands: Impaired Following commands impaired: Follows one step commands inconsistently, Follows one step commands with increased time    Cueing Cueing Techniques: Verbal cues  Exercises      General Comments General comments (skin integrity, edema, etc.): HR tachy up to 144bpm with short gait, needing increased time at rest to resolve to 120's, further exercise deferred      Pertinent Vitals/Pain Pain Assessment Pain Assessment: Faces Faces Pain Scale: Hurts little more Pain Location: headache Pain Descriptors / Indicators: Headache Pain Intervention(s): Monitored during session, Limited activity within patient's tolerance    Home Living                          Prior Function            PT Goals (current goals can now be found in the care plan section) Acute Rehab PT Goals Patient Stated Goal: to reduce pain and get stronger PT Goal Formulation: With patient Time For Goal Achievement: 07/01/24 Progress towards PT goals: Progressing toward goals    Frequency    Min 2X/week      PT Plan      Co-evaluation              AM-PAC PT 6 Clicks Mobility   Outcome Measure  Help needed turning from your back to your side while in a flat bed without using bedrails?: A Little Help needed moving from lying on your back to sitting on the side of a flat bed without using  bedrails?: A Little Help needed moving to and from a bed to a chair (including a wheelchair)?: A Little Help needed standing up from a chair using your arms (e.g., wheelchair or bedside chair)?: A Little Help needed to walk in hospital room?: A Lot (<40') Help needed climbing 3-5 steps with a railing? : Total 6 Click Score: 15    End of Session Equipment Utilized During Treatment: Gait belt Activity Tolerance: Patient tolerated treatment well Patient left: in bed;with call bell/phone within reach;with bed alarm set;Other (comment) (with fall mat replaced) Nurse Communication: Mobility status;Other (comment) (HR) PT Visit Diagnosis: Difficulty in walking, not elsewhere classified (R26.2);Unsteadiness on feet (R26.81);Other abnormalities of gait and mobility (R26.89);Muscle weakness (generalized) (M62.81);Other symptoms and signs involving the nervous system (R29.898)     Time: 8569-8545 PT Time Calculation (min) (ACUTE ONLY): 24 min  Charges:    $Gait Training: 8-22 mins $Therapeutic Activity: 8-22 mins PT General Charges $$ ACUTE PT VISIT: 1 Visit                     Anothy Bufano R. PTA Acute Rehabilitation Services Office: [phone]   Therisa CHRISTELLA Boor 06/19/2024, 3:00 PM

## 2024-06-19 NOTE — Progress Notes (Signed)
 [facility]  PROGRESS NOTE  Betty Golden FMW:[id number] DOB: 14-Apr-1946 DOA: 06/16/2024 PCP: Irven Ozell DEL, MD   Brief HPI:   78 y.o. female with medical history significant of hypertension, hyperlipidemia, CVA, residual expressive aphasia, GERD, depression, rheumatoid arthritis, asthma, COPD, anemia, spinal stenosis, fibromyalgia presenting after a fall at home  Patient reportedly got dizzy and had a fall last night at home. She did hit the back of her neck and her back. Noted to have some abdominal pain and leg pain afterwards. Has history of prior CVA and incidental repeat CVA noted last month. And has slurred speech/expressive aphasia at baseline.  CT head showed no acute abnormality.  CT C-spine showed no acute abnormality.   CT chest abdomen pelvis showed no acute abnormality but showed chronic emphysema, hepatic steatosis, diverticulosis without diverticulitis.   MRI brain showed acute infarct and chronic changes from prior infarct.   While in triage, RN drew blood and family was present at the beside when patient suddenly stopped responding to them and was staring off not responding   Neurology was consulted   Assessment/Plan:   Status post fall/acute CVA/questionable seizure - Presented with fall at home; found to have stroke on MRI brain - Had seizure-like episode in the ED - Neurology consulted, started on Keppra  - Stroke workup started; EEG negative for epileptiform discharges - Continue aspirin  and Plavix  - Continue rosuvastatin  - Follow neurology recommendations - Neurology recommends aspirin  and Plavix  for 3 months and then Plavix  alone - Plan to go to CIR - Had loop recorder placed on 10/29, interrogation was negative for A-fib   Lactic acidosis/leukocytosis -Presumed reactive - Lactic acid is down from 4.3-1.7 - WBC is down from 18,000-13.5 - UA is abnormal, urine culture was obtained; grew multiple species - Started on ceftriaxone ; will  discontinue ceftriaxone    Hypertension - Blood pressure is mildly elevated -Continue permissive hypertension  Hyperlipidemia - Continue statin  Depression - Continue duloxetine , trazodone , clonazepam   Rheumatoid arthritis Continue Areva  Spinal stenosis/fibromyalgia - Continue Lyrica   Anemia - Hemoglobin is stable     DVT prophylaxis: Lovenox   Medications     aspirin  EC  81 mg Oral Daily   budesonide -glycopyrrolate -formoterol   2 puff Inhalation BID   clopidogrel   75 mg Oral Daily   DULoxetine   30 mg Oral BID   enoxaparin  (LOVENOX ) injection  40 mg Subcutaneous Q24H   leflunomide   20 mg Oral Daily   levETIRAcetam   500 mg Oral BID   pantoprazole   40 mg Oral Daily   rosuvastatin   20 mg Oral Daily   traZODone   25 mg Oral QHS     Data Reviewed:   CBG:  Recent Labs  Lab 06/16/24 1244  GLUCAP 128*    SpO2: 96 %    Vitals:   06/19/24 0153 06/19/24 0340 06/19/24 0736 06/19/24 0823  BP: (!) 128/56 (!) 146/69 127/67   Pulse: (!) 114 (!) 102 (!) 101   Resp: 16 17 14    Temp: 98.4 F (36.9 C) 97.9 F (36.6 C) 98.5 F (36.9 C)   TempSrc: Oral Oral Oral   SpO2: 96% 97% 97% 96%  Weight:      Height:          Data Reviewed:  Basic Metabolic Panel: Recent Labs  Lab 06/16/24 1234 06/16/24 1244 06/17/24 0504  NA 140 141 143  K 3.6 3.5 4.1  CL 104 109 109  CO2 17*  --  17*  GLUCOSE 126* 99 85  BUN 13  14 9  CREATININE 1.12* 0.90 0.84  CALCIUM  10.2  --  9.4    CBC: Recent Labs  Lab 06/16/24 1234 06/16/24 1244 06/17/24 0504  WBC 18.1*  --  13.5*  NEUTROABS 12.5*  --   --   HGB 12.8 13.6 11.7*  HCT 39.2 40.0 37.3  MCV 86.7  --  88.6  PLT 194  --  145*    LFT Recent Labs  Lab 06/16/24 1234 06/17/24 0504  AST 28 21  ALT 17 15  ALKPHOS 61 51  BILITOT 0.6 1.0  PROT 7.6 6.0*  ALBUMIN 3.8 3.0*     Antibiotics: Anti-infectives (From admission, onward)    Start     Dose/Rate Route Frequency Ordered Stop   06/17/24 1230   cefTRIAXone  (ROCEPHIN ) 1 g in sodium chloride  0.9 % 100 mL IVPB  Status:  Discontinued        1 g 200 mL/hr over 30 Minutes Intravenous Every 24 hours 06/17/24 1221 06/18/24 1433        CONSULTS Lovenox   Code Status: Full code  Family Communication: No family at bedside     Subjective   Denies any complaints   Objective    Physical Examination:   Appears in no acute distress S1-S2, regular, no murmur auscultated Lungs clear to auscultation bilaterally Abdomen soft, nontender             Temeka Pore S Moraima Burd   [facility] If 7PM-7AM, please contact night-coverage at [email or web address], Office  [phone]   06/19/2024, 9:22 AM  LOS: 2 days

## 2024-06-19 NOTE — Plan of Care (Signed)
  Problem: Ischemic Stroke/TIA Tissue Perfusion: Goal: Complications of ischemic stroke/TIA will be minimized Outcome: Progressing   Problem: Health Behavior/Discharge Planning: Goal: Ability to manage health-related needs will improve Outcome: Progressing Goal: Goals will be collaboratively established with patient/family Outcome: Progressing   Problem: Nutrition: Goal: Risk of aspiration will decrease Outcome: Progressing Goal: Dietary intake will improve Outcome: Progressing   Problem: Clinical Measurements: Goal: Ability to maintain clinical measurements within normal limits will improve Outcome: Progressing Goal: Will remain free from infection Outcome: Progressing Goal: Diagnostic test results will improve Outcome: Progressing Goal: Respiratory complications will improve Outcome: Progressing Goal: Cardiovascular complication will be avoided Outcome: Progressing   Problem: Activity: Goal: Risk for activity intolerance will decrease Outcome: Progressing   Problem: Elimination: Goal: Will not experience complications related to bowel motility Outcome: Progressing Goal: Will not experience complications related to urinary retention Outcome: Progressing   Problem: Pain Managment: Goal: General experience of comfort will improve and/or be controlled Outcome: Progressing   Problem: Skin Integrity: Goal: Risk for impaired skin integrity will decrease Outcome: Progressing

## 2024-06-20 DIAGNOSIS — I639 Cerebral infarction, unspecified: Secondary | ICD-10-CM | POA: Diagnosis not present

## 2024-06-20 DIAGNOSIS — R4701 Aphasia: Secondary | ICD-10-CM | POA: Diagnosis not present

## 2024-06-20 DIAGNOSIS — J410 Simple chronic bronchitis: Secondary | ICD-10-CM | POA: Diagnosis not present

## 2024-06-20 DIAGNOSIS — R2990 Unspecified symptoms and signs involving the nervous system: Secondary | ICD-10-CM | POA: Diagnosis not present

## 2024-06-20 NOTE — Progress Notes (Addendum)
 [facility]  PROGRESS NOTE  Betty Golden FMW:[id number] DOB: 08/04/1945 DOA: 06/16/2024 PCP: Irven Ozell DEL, MD   Brief HPI:   78 y.o. female with medical history significant of hypertension, hyperlipidemia, CVA, residual expressive aphasia, GERD, depression, rheumatoid arthritis, asthma, COPD, anemia, spinal stenosis, fibromyalgia presenting after a fall at home  Patient reportedly got dizzy and had a fall last night at home. She did hit the back of her neck and her back. Noted to have some abdominal pain and leg pain afterwards. Has history of prior CVA and incidental repeat CVA noted last month. And has slurred speech/expressive aphasia at baseline.  CT head showed no acute abnormality.  CT C-spine showed no acute abnormality.   CT chest abdomen pelvis showed no acute abnormality but showed chronic emphysema, hepatic steatosis, diverticulosis without diverticulitis.   MRI brain showed acute infarct and chronic changes from prior infarct.   While in triage, RN drew blood and family was present at the beside when patient suddenly stopped responding to them and was staring off not responding   Neurology was consulted   Assessment/Plan:   Status post fall/acute CVA/questionable seizure - Presented with fall at home; found to have stroke on MRI brain - Had seizure-like episode in the ED - Neurology consulted, started on Keppra  - Stroke workup started; EEG negative for epileptiform discharges - Continue aspirin  and Plavix  - Continue rosuvastatin  - Follow neurology recommendations - Neurology recommends aspirin  and Plavix  for 3 months and then Plavix  alone - Plan to go to CIR - Had loop recorder placed on 10/29, interrogation was negative for A-fib   Lactic acidosis/leukocytosis -Presumed reactive - Lactic acid is down from 4.3-1.7 - WBC is down from 18,000-13.5 - UA is abnormal, urine culture was obtained; grew multiple species - Started on ceftriaxone ; will  discontinue ceftriaxone    Hypertension - Blood pressure is stable  Hyperlipidemia - Continue statin  Depression - Continue duloxetine , trazodone , clonazepam   Rheumatoid arthritis Continue Areva  Spinal stenosis/fibromyalgia - Continue Lyrica   Anemia - Hemoglobin is stable     DVT prophylaxis: Lovenox   Medications     aspirin  EC  81 mg Oral Daily   budesonide -glycopyrrolate -formoterol   2 puff Inhalation BID   clopidogrel   75 mg Oral Daily   DULoxetine   30 mg Oral BID   enoxaparin  (LOVENOX ) injection  40 mg Subcutaneous Q24H   leflunomide   20 mg Oral Daily   levETIRAcetam   500 mg Oral BID   pantoprazole   40 mg Oral Daily   rosuvastatin   20 mg Oral Daily   traZODone   25 mg Oral QHS     Data Reviewed:   CBG:  Recent Labs  Lab 06/16/24 1244  GLUCAP 128*    SpO2: 98 %    Vitals:   06/20/24 0044 06/20/24 0444 06/20/24 0819 06/20/24 1141  BP: 128/63 124/64 133/67 133/74  Pulse: 99 99 92 97  Resp: 16 17 16 19   Temp: 98.9 F (37.2 C) 97.8 F (36.6 C) 98 F (36.7 C) (!) 97.5 F (36.4 C)  TempSrc: Oral Oral Oral Oral  SpO2: 100% 95% 94% 98%  Weight:      Height:          Data Reviewed:  Basic Metabolic Panel: Recent Labs  Lab 06/16/24 1234 06/16/24 1244 06/17/24 0504  NA 140 141 143  K 3.6 3.5 4.1  CL 104 109 109  CO2 17*  --  17*  GLUCOSE 126* 99 85  BUN 13 14 9   CREATININE 1.12*  0.90 0.84  CALCIUM  10.2  --  9.4    CBC: Recent Labs  Lab 06/16/24 1234 06/16/24 1244 06/17/24 0504  WBC 18.1*  --  13.5*  NEUTROABS 12.5*  --   --   HGB 12.8 13.6 11.7*  HCT 39.2 40.0 37.3  MCV 86.7  --  88.6  PLT 194  --  145*    LFT Recent Labs  Lab 06/16/24 1234 06/17/24 0504  AST 28 21  ALT 17 15  ALKPHOS 61 51  BILITOT 0.6 1.0  PROT 7.6 6.0*  ALBUMIN 3.8 3.0*     Antibiotics: Anti-infectives (From admission, onward)    Start     Dose/Rate Route Frequency Ordered Stop   06/17/24 1230  cefTRIAXone  (ROCEPHIN ) 1 g in sodium  chloride 0.9 % 100 mL IVPB  Status:  Discontinued        1 g 200 mL/hr over 30 Minutes Intravenous Every 24 hours 06/17/24 1221 06/18/24 1433        CONSULTS Lovenox   Code Status: Full code  Family Communication: No family at bedside     Subjective   Patient seen and examined.  Denies any complaints.   Objective    Physical Examination:  Appears in no acute distress S1-S2, regular Lungs clear to auscultation bilaterally Abdomen is soft, nontender, no organomegaly             Mckenzie Toruno S [place] Sylvan   [facility] If 7PM-7AM, please contact night-coverage at [email or web address], Office  [phone]   06/20/2024, 1:52 PM  LOS: 3 days

## 2024-06-20 NOTE — Plan of Care (Signed)
  Problem: Education: Goal: Knowledge of disease or condition will improve Outcome: Progressing Goal: Knowledge of secondary prevention will improve (MUST DOCUMENT ALL) Outcome: Progressing Goal: Knowledge of patient specific risk factors will improve (DELETE if not current risk factor) Outcome: Progressing   Problem: Ischemic Stroke/TIA Tissue Perfusion: Goal: Complications of ischemic stroke/TIA will be minimized Outcome: Progressing   Problem: Coping: Goal: Will verbalize positive feelings about self Outcome: Progressing Goal: Will identify appropriate support needs Outcome: Progressing   Problem: Health Behavior/Discharge Planning: Goal: Ability to manage health-related needs will improve Outcome: Progressing Goal: Goals will be collaboratively established with patient/family Outcome: Progressing   Problem: Self-Care: Goal: Ability to participate in self-care as condition permits will improve Outcome: Progressing Goal: Verbalization of feelings and concerns over difficulty with self-care will improve Outcome: Progressing Goal: Ability to communicate needs accurately will improve Outcome: Progressing   Problem: Nutrition: Goal: Risk of aspiration will decrease Outcome: Progressing Goal: Dietary intake will improve Outcome: Progressing   Problem: Education: Goal: Knowledge of General Education information will improve Description: Including pain rating scale, medication(s)/side effects and non-pharmacologic comfort measures Outcome: Progressing   Problem: Health Behavior/Discharge Planning: Goal: Ability to manage health-related needs will improve Outcome: Progressing   Problem: Clinical Measurements: Goal: Ability to maintain clinical measurements within normal limits will improve Outcome: Progressing Goal: Will remain free from infection Outcome: Progressing Goal: Diagnostic test results will improve Outcome: Progressing Goal: Respiratory complications will  improve Outcome: Progressing Goal: Cardiovascular complication will be avoided Outcome: Progressing   Problem: Nutrition: Goal: Adequate nutrition will be maintained Outcome: Progressing   Problem: Coping: Goal: Level of anxiety will decrease Outcome: Progressing   Problem: Elimination: Goal: Will not experience complications related to bowel motility Outcome: Progressing Goal: Will not experience complications related to urinary retention Outcome: Progressing   Problem: Pain Managment: Goal: General experience of comfort will improve and/or be controlled Outcome: Progressing   Problem: Safety: Goal: Ability to remain free from injury will improve Outcome: Progressing   Problem: Skin Integrity: Goal: Risk for impaired skin integrity will decrease Outcome: Progressing

## 2024-06-20 NOTE — Plan of Care (Signed)
  Problem: Self-Care: Goal: Ability to participate in self-care as condition permits will improve Outcome: Progressing Goal: Verbalization of feelings and concerns over difficulty with self-care will improve Outcome: Progressing Goal: Ability to communicate needs accurately will improve Outcome: Progressing   Problem: Nutrition: Goal: Risk of aspiration will decrease Outcome: Progressing Goal: Dietary intake will improve Outcome: Progressing   Problem: Clinical Measurements: Goal: Ability to maintain clinical measurements within normal limits will improve Outcome: Progressing Goal: Will remain free from infection Outcome: Progressing Goal: Diagnostic test results will improve Outcome: Progressing Goal: Respiratory complications will improve Outcome: Progressing Goal: Cardiovascular complication will be avoided Outcome: Progressing   Problem: Activity: Goal: Risk for activity intolerance will decrease Outcome: Progressing   Problem: Elimination: Goal: Will not experience complications related to bowel motility Outcome: Progressing Goal: Will not experience complications related to urinary retention Outcome: Progressing   Problem: Pain Managment: Goal: General experience of comfort will improve and/or be controlled Outcome: Progressing   Problem: Safety: Goal: Ability to remain free from injury will improve Outcome: Progressing   Problem: Skin Integrity: Goal: Risk for impaired skin integrity will decrease Outcome: Progressing

## 2024-06-21 DIAGNOSIS — R2990 Unspecified symptoms and signs involving the nervous system: Secondary | ICD-10-CM | POA: Diagnosis not present

## 2024-06-21 DIAGNOSIS — R4701 Aphasia: Secondary | ICD-10-CM | POA: Diagnosis not present

## 2024-06-21 DIAGNOSIS — I639 Cerebral infarction, unspecified: Secondary | ICD-10-CM | POA: Diagnosis not present

## 2024-06-21 DIAGNOSIS — J410 Simple chronic bronchitis: Secondary | ICD-10-CM | POA: Diagnosis not present

## 2024-06-21 LAB — GLUCOSE, CAPILLARY: Glucose-Capillary: 105 mg/dL — ABNORMAL HIGH (ref 70–99)

## 2024-06-21 NOTE — Progress Notes (Addendum)
 Inpatient Rehab Admissions Coordinator:   Addendum: Patient received insurance approval for CIR admission. Will plan to admit when bed available.    Met with patient to discuss that we continue to await insurance approval and bed availability. Will continue to follow for potential CIR admission.   Rehab Admissons Coordinator Son Barkan, [place], [place] [phone]

## 2024-06-21 NOTE — Plan of Care (Signed)
  Problem: Coping: Goal: Will verbalize positive feelings about self Outcome: Progressing Goal: Will identify appropriate support needs Outcome: Progressing   Problem: Self-Care: Goal: Ability to participate in self-care as condition permits will improve Outcome: Progressing Goal: Verbalization of feelings and concerns over difficulty with self-care will improve Outcome: Progressing Goal: Ability to communicate needs accurately will improve Outcome: Progressing   Problem: Nutrition: Goal: Risk of aspiration will decrease Outcome: Progressing Goal: Dietary intake will improve Outcome: Progressing   Problem: Clinical Measurements: Goal: Ability to maintain clinical measurements within normal limits will improve Outcome: Progressing Goal: Will remain free from infection Outcome: Progressing Goal: Diagnostic test results will improve Outcome: Progressing Goal: Respiratory complications will improve Outcome: Progressing Goal: Cardiovascular complication will be avoided Outcome: Progressing   Problem: Activity: Goal: Risk for activity intolerance will decrease Outcome: Progressing   Problem: Elimination: Goal: Will not experience complications related to bowel motility Outcome: Progressing Goal: Will not experience complications related to urinary retention Outcome: Progressing   Problem: Pain Managment: Goal: General experience of comfort will improve and/or be controlled Outcome: Progressing   Problem: Safety: Goal: Ability to remain free from injury will improve Outcome: Progressing   Problem: Skin Integrity: Goal: Risk for impaired skin integrity will decrease Outcome: Progressing

## 2024-06-21 NOTE — Progress Notes (Signed)
 [facility]  PROGRESS NOTE  ELAURA CALIX FMW:[id number] DOB: 1946/07/20 DOA: 06/16/2024 PCP: Irven Ozell DEL, MD   Brief HPI:   78 y.o. female with medical history significant of hypertension, hyperlipidemia, CVA, residual expressive aphasia, GERD, depression, rheumatoid arthritis, asthma, COPD, anemia, spinal stenosis, fibromyalgia presenting after a fall at home  Patient reportedly got dizzy and had a fall last night at home. She did hit the back of her neck and her back. Noted to have some abdominal pain and leg pain afterwards. Has history of prior CVA and incidental repeat CVA noted last month. And has slurred speech/expressive aphasia at baseline.  CT head showed no acute abnormality.  CT C-spine showed no acute abnormality.   CT chest abdomen pelvis showed no acute abnormality but showed chronic emphysema, hepatic steatosis, diverticulosis without diverticulitis.   MRI brain showed acute infarct and chronic changes from prior infarct.   While in triage, RN drew blood and family was present at the beside when patient suddenly stopped responding to them and was staring off not responding   Neurology was consulted   Assessment/Plan:   Status post fall/acute CVA/questionable seizure - Presented with fall at home; found to have stroke on MRI brain - Had seizure-like episode in the ED - Neurology consulted, started on Keppra  - Stroke workup started; EEG negative for epileptiform discharges -Will discuss with neurology regarding continuing Keppra  or stopping it - Continue aspirin  and Plavix  - Continue rosuvastatin  - Follow neurology recommendations - Neurology recommends aspirin  and Plavix  for 3 months and then Plavix  alone - Plan to go to CIR - Had loop recorder placed on 10/29, interrogation was negative for A-fib   Lactic acidosis/leukocytosis -Presumed reactive - Lactic acid is down from 4.3-1.7 - WBC is down from 18,000-13.5 - UA is abnormal, urine culture was  obtained; grew multiple species - Started on ceftriaxone ; will discontinue ceftriaxone    Hypertension - Blood pressure is stable  Hyperlipidemia - Continue statin  Depression - Continue duloxetine , trazodone , clonazepam   Rheumatoid arthritis Continue Areva  Spinal stenosis/fibromyalgia - Continue Lyrica   Anemia - Hemoglobin is stable     DVT prophylaxis: Lovenox   Medications     aspirin  EC  81 mg Oral Daily   budesonide -glycopyrrolate -formoterol   2 puff Inhalation BID   clopidogrel   75 mg Oral Daily   DULoxetine   30 mg Oral BID   enoxaparin  (LOVENOX ) injection  40 mg Subcutaneous Q24H   leflunomide   20 mg Oral Daily   levETIRAcetam   500 mg Oral BID   pantoprazole   40 mg Oral Daily   rosuvastatin   20 mg Oral Daily   traZODone   25 mg Oral QHS     Data Reviewed:   CBG:  Recent Labs  Lab 06/16/24 1244  GLUCAP 128*    SpO2: 96 %    Vitals:   06/20/24 1935 06/20/24 2343 06/21/24 0359 06/21/24 0816  BP: (!) 105/91 137/68 (!) 115/54   Pulse: (!) 109 (!) 103 96   Resp: 17 17 17    Temp: 98.3 F (36.8 C) 97.8 F (36.6 C) 98.4 F (36.9 C)   TempSrc: Oral Oral Oral   SpO2: 97% 99% 97% 96%  Weight:      Height:          Data Reviewed:  Basic Metabolic Panel: Recent Labs  Lab 06/16/24 1234 06/16/24 1244 06/17/24 0504  NA 140 141 143  K 3.6 3.5 4.1  CL 104 109 109  CO2 17*  --  17*  GLUCOSE 126*  99 85  BUN 13 14 9   CREATININE 1.12* 0.90 0.84  CALCIUM  10.2  --  9.4    CBC: Recent Labs  Lab 06/16/24 1234 06/16/24 1244 06/17/24 0504  WBC 18.1*  --  13.5*  NEUTROABS 12.5*  --   --   HGB 12.8 13.6 11.7*  HCT 39.2 40.0 37.3  MCV 86.7  --  88.6  PLT 194  --  145*    LFT Recent Labs  Lab 06/16/24 1234 06/17/24 0504  AST 28 21  ALT 17 15  ALKPHOS 61 51  BILITOT 0.6 1.0  PROT 7.6 6.0*  ALBUMIN 3.8 3.0*     Antibiotics: Anti-infectives (From admission, onward)    Start     Dose/Rate Route Frequency Ordered Stop   06/17/24  1230  cefTRIAXone  (ROCEPHIN ) 1 g in sodium chloride  0.9 % 100 mL IVPB  Status:  Discontinued        1 g 200 mL/hr over 30 Minutes Intravenous Every 24 hours 06/17/24 1221 06/18/24 1433        CONSULTS Lovenox   Code Status: Full code  Family Communication: No family at bedside     Subjective    Denies any complaints  Objective    Physical Examination:   General-appears in no acute distress Heart-S1-S2, regular, no murmur auscultated Lungs-clear to auscultation bilaterally, no wheezing or crackles auscultated Abdomen-soft, nontender, no organomegaly Extremities-no edema in the lower extremities Neuro-alert, oriented x3, no focal deficit noted            Bentleigh Waren S Lanai Conlee   [facility] If 7PM-7AM, please contact night-coverage at [email or web address], Office  [phone]   06/21/2024, 8:25 AM  LOS: 4 days

## 2024-06-21 NOTE — Progress Notes (Signed)
 Physical Therapy Treatment Patient Details Name: Betty Golden MRN: [id number] DOB: 1945/09/29 Today's Date: 06/21/2024   History of Present Illness Pt is a 78 y.o. female who presented 06/16/24 after getting dizzy and falling. 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 Pt able to use the bathroom and perform pericare without assist. Pt able to increase gait distance this session, but demonstrates poor fluidity and carryover of cues. Pt requires frequent cues for sequencing and problem solving throughout session. Pt able to perform additional stand and attempt standing marches, but pt reports increased L ankle pain and declines further mobility. Pt continues to benefit from PT services to progress toward functional mobility goals.     If plan is discharge home, recommend the following: A lot of help with walking and/or transfers;A lot of help with bathing/dressing/bathroom;Assistance with cooking/housework;Direct supervision/assist for medications management;Direct supervision/assist for financial management;Assist for transportation;Help with stairs or ramp for entrance;Supervision due to cognitive status   Can travel by private vehicle        Equipment Recommendations  None recommended by PT    Recommendations for Other Services       Precautions / Restrictions Precautions Precautions: Fall Recall of Precautions/Restrictions: Impaired Precaution/Restrictions Comments: aphasia at baseline, watch HR Restrictions Weight Bearing Restrictions Per Provider Order: No     Mobility  Bed Mobility Overal bed mobility: Needs Assistance Bed Mobility: Supine to Sit     Supine  to sit: Supervision     General bed mobility comments: increased time    Transfers Overall transfer level: Needs assistance Equipment used: Rolling walker (2 wheels) Transfers: Sit to/from Stand Sit to Stand: Min assist           General transfer comment: STS from EOB and toilet with light min A for rise and cues for hand placement    Ambulation/Gait Ambulation/Gait assistance: Min assist Gait Distance (Feet): 10 Feet (+50) Assistive device: Rolling walker (2 wheels) Gait Pattern/deviations: Decreased step length - right, Decreased step length - left, Decreased stride length, Decreased dorsiflexion - right, Decreased dorsiflexion - left, Knee flexed in stance - right, Trunk flexed, Step-to pattern, Decreased stance time - right Gait velocity: reduced     General Gait Details: Pt demonstrates short steps with increased time for processing and pt tending to pause after each stride. Cues for sequencing, upward gaze, and increased foot clearance. Pt demonstrates improved pace and fluidity with short distance to the recliner at end of trial with decreased focus on ambulation   Stairs             Wheelchair Mobility     Tilt Bed    Modified Rankin (Stroke Patients Only) Modified Rankin (Stroke Patients Only) Pre-Morbid Rankin Score: Moderate disability Modified Rankin: Moderately severe disability     Balance Overall balance assessment: Needs assistance Sitting-balance support: No upper extremity supported, Feet supported Sitting balance-Leahy Scale: Good Sitting balance - Comments: static sitting EOB with CGA for safety   Standing balance support: Bilateral upper extremity supported, During functional activity, Reliant on assistive device for balance Standing balance-Leahy Scale: Poor Standing balance comment: reliant on RW and external assist  Communication Communication Communication: Impaired Factors Affecting  Communication: Difficulty expressing self  Cognition Arousal: Alert Behavior During Therapy: WFL for tasks assessed/performed   PT - Cognitive impairments: No family/caregiver present to determine baseline, Awareness, Memory, Attention, Sequencing, Problem solving, Safety/Judgement                         Following commands: Impaired Following commands impaired: Follows one step commands inconsistently, Follows one step commands with increased time    Cueing Cueing Techniques: Verbal cues  Exercises      General Comments General comments (skin integrity, edema, etc.): HR up to 120's with mobility      Pertinent Vitals/Pain Pain Assessment Pain Assessment: Faces Faces Pain Scale: Hurts little more Pain Location: L ankle Pain Descriptors / Indicators: Aching Pain Intervention(s): Limited activity within patient's tolerance, Monitored during session     PT Goals (current goals can now be found in the care plan section) Acute Rehab PT Goals Patient Stated Goal: to reduce pain and get stronger PT Goal Formulation: With patient Time For Goal Achievement: 07/01/24 Progress towards PT goals: Progressing toward goals    Frequency    Min 2X/week       AM-PAC PT 6 Clicks Mobility   Outcome Measure  Help needed turning from your back to your side while in a flat bed without using bedrails?: A Little Help needed moving from lying on your back to sitting on the side of a flat bed without using bedrails?: A Little Help needed moving to and from a bed to a chair (including a wheelchair)?: A Little Help needed standing up from a chair using your arms (e.g., wheelchair or bedside chair)?: A Little Help needed to walk in hospital room?: A Little Help needed climbing 3-5 steps with a railing? : A Lot 6 Click Score: 17    End of Session Equipment Utilized During Treatment: Gait belt Activity Tolerance: Patient tolerated treatment well Patient left: with call bell/phone  within reach;in chair;with chair alarm set Nurse Communication: Mobility status PT Visit Diagnosis: Difficulty in walking, not elsewhere classified (R26.2);Unsteadiness on feet (R26.81);Other abnormalities of gait and mobility (R26.89);Muscle weakness (generalized) (M62.81);Other symptoms and signs involving the nervous system (R29.898)     Time: 8948-8878 PT Time Calculation (min) (ACUTE ONLY): 30 min  Charges:    $Gait Training: 8-22 mins $Therapeutic Activity: 8-22 mins PT General Charges $$ ACUTE PT VISIT: 1 Visit                    Darryle George, PTA Acute Rehabilitation Services Secure Chat Preferred  [phone]    Darryle George 06/21/2024, 12:57 PM

## 2024-06-22 ENCOUNTER — Inpatient Hospital Stay (HOSPITAL_COMMUNITY)
Admission: AD | Admit: 2024-06-22 | Discharge: 2024-07-02 | DRG: 057 | Disposition: A | Discharge status: Home / Self Care | Source: Intra-hospital | Attending: Physical Medicine & Rehabilitation | Admitting: Physical Medicine & Rehabilitation

## 2024-06-22 ENCOUNTER — Inpatient Hospital Stay (HOSPITAL_COMMUNITY)

## 2024-06-22 ENCOUNTER — Encounter (HOSPITAL_COMMUNITY): Payer: Self-pay | Admitting: Physical Medicine & Rehabilitation

## 2024-06-22 ENCOUNTER — Other Ambulatory Visit: Payer: Self-pay

## 2024-06-22 DIAGNOSIS — Z79899 Other long term (current) drug therapy: Secondary | ICD-10-CM

## 2024-06-22 DIAGNOSIS — I2510 Atherosclerotic heart disease of native coronary artery without angina pectoris: Secondary | ICD-10-CM | POA: Diagnosis present

## 2024-06-22 DIAGNOSIS — Z9841 Cataract extraction status, right eye: Secondary | ICD-10-CM

## 2024-06-22 DIAGNOSIS — Z9049 Acquired absence of other specified parts of digestive tract: Secondary | ICD-10-CM

## 2024-06-22 DIAGNOSIS — J4489 Other specified chronic obstructive pulmonary disease: Secondary | ICD-10-CM | POA: Diagnosis present

## 2024-06-22 DIAGNOSIS — K589 Irritable bowel syndrome without diarrhea: Secondary | ICD-10-CM | POA: Diagnosis present

## 2024-06-22 DIAGNOSIS — Z8701 Personal history of pneumonia (recurrent): Secondary | ICD-10-CM

## 2024-06-22 DIAGNOSIS — R4701 Aphasia: Secondary | ICD-10-CM | POA: Diagnosis not present

## 2024-06-22 DIAGNOSIS — M542 Cervicalgia: Secondary | ICD-10-CM | POA: Diagnosis present

## 2024-06-22 DIAGNOSIS — R519 Headache, unspecified: Secondary | ICD-10-CM | POA: Diagnosis present

## 2024-06-22 DIAGNOSIS — K219 Gastro-esophageal reflux disease without esophagitis: Secondary | ICD-10-CM | POA: Diagnosis present

## 2024-06-22 DIAGNOSIS — M797 Fibromyalgia: Secondary | ICD-10-CM | POA: Diagnosis present

## 2024-06-22 DIAGNOSIS — Z885 Allergy status to narcotic agent status: Secondary | ICD-10-CM

## 2024-06-22 DIAGNOSIS — I639 Cerebral infarction, unspecified: Secondary | ICD-10-CM | POA: Diagnosis not present

## 2024-06-22 DIAGNOSIS — M4802 Spinal stenosis, cervical region: Secondary | ICD-10-CM | POA: Diagnosis present

## 2024-06-22 DIAGNOSIS — R41841 Cognitive communication deficit: Secondary | ICD-10-CM

## 2024-06-22 DIAGNOSIS — Z87891 Personal history of nicotine dependence: Secondary | ICD-10-CM

## 2024-06-22 DIAGNOSIS — G8929 Other chronic pain: Secondary | ICD-10-CM | POA: Diagnosis present

## 2024-06-22 DIAGNOSIS — E785 Hyperlipidemia, unspecified: Secondary | ICD-10-CM | POA: Diagnosis present

## 2024-06-22 DIAGNOSIS — R630 Anorexia: Secondary | ICD-10-CM | POA: Diagnosis present

## 2024-06-22 DIAGNOSIS — I69320 Aphasia following cerebral infarction: Principal | ICD-10-CM

## 2024-06-22 DIAGNOSIS — Z8249 Family history of ischemic heart disease and other diseases of the circulatory system: Secondary | ICD-10-CM

## 2024-06-22 DIAGNOSIS — Z9842 Cataract extraction status, left eye: Secondary | ICD-10-CM

## 2024-06-22 DIAGNOSIS — Z8673 Personal history of transient ischemic attack (TIA), and cerebral infarction without residual deficits: Secondary | ICD-10-CM | POA: Diagnosis not present

## 2024-06-22 DIAGNOSIS — Z7902 Long term (current) use of antithrombotics/antiplatelets: Secondary | ICD-10-CM

## 2024-06-22 DIAGNOSIS — Z8619 Personal history of other infectious and parasitic diseases: Secondary | ICD-10-CM

## 2024-06-22 DIAGNOSIS — F32A Depression, unspecified: Secondary | ICD-10-CM | POA: Diagnosis present

## 2024-06-22 DIAGNOSIS — K5900 Constipation, unspecified: Secondary | ICD-10-CM | POA: Diagnosis present

## 2024-06-22 DIAGNOSIS — Z961 Presence of intraocular lens: Secondary | ICD-10-CM | POA: Diagnosis present

## 2024-06-22 DIAGNOSIS — Z882 Allergy status to sulfonamides status: Secondary | ICD-10-CM

## 2024-06-22 DIAGNOSIS — Z7982 Long term (current) use of aspirin: Secondary | ICD-10-CM

## 2024-06-22 DIAGNOSIS — M069 Rheumatoid arthritis, unspecified: Secondary | ICD-10-CM | POA: Diagnosis present

## 2024-06-22 DIAGNOSIS — Z6821 Body mass index (BMI) 21.0-21.9, adult: Secondary | ICD-10-CM

## 2024-06-22 DIAGNOSIS — M2578 Osteophyte, vertebrae: Secondary | ICD-10-CM | POA: Diagnosis present

## 2024-06-22 DIAGNOSIS — Z825 Family history of asthma and other chronic lower respiratory diseases: Secondary | ICD-10-CM

## 2024-06-22 DIAGNOSIS — D72829 Elevated white blood cell count, unspecified: Secondary | ICD-10-CM | POA: Diagnosis not present

## 2024-06-22 DIAGNOSIS — Z833 Family history of diabetes mellitus: Secondary | ICD-10-CM

## 2024-06-22 DIAGNOSIS — Z823 Family history of stroke: Secondary | ICD-10-CM

## 2024-06-22 DIAGNOSIS — E43 Unspecified severe protein-calorie malnutrition: Secondary | ICD-10-CM | POA: Diagnosis present

## 2024-06-22 DIAGNOSIS — I10 Essential (primary) hypertension: Secondary | ICD-10-CM | POA: Diagnosis not present

## 2024-06-22 DIAGNOSIS — Z7951 Long term (current) use of inhaled steroids: Secondary | ICD-10-CM

## 2024-06-22 DIAGNOSIS — Y92009 Unspecified place in unspecified non-institutional (private) residence as the place of occurrence of the external cause: Secondary | ICD-10-CM

## 2024-06-22 DIAGNOSIS — F09 Unspecified mental disorder due to known physiological condition: Secondary | ICD-10-CM

## 2024-06-22 DIAGNOSIS — W19XXXD Unspecified fall, subsequent encounter: Secondary | ICD-10-CM | POA: Diagnosis present

## 2024-06-22 LAB — CREATININE, SERUM
Creatinine, Ser: 0.89 mg/dL (ref 0.44–1.00)
GFR, Estimated: >60 mL/min (ref >60)

## 2024-06-22 LAB — CBC
HCT: 34.9 % — ABNORMAL LOW (ref 36.0–46.0)
Hemoglobin: 11.5 g/dL — ABNORMAL LOW (ref 12.0–15.0)
MCH: 28 pg (ref 26.0–34.0)
MCHC: 33 g/dL (ref 30.0–36.0)
MCV: 84.9 fL (ref 80.0–100.0)
Platelets: 239 K/uL (ref 150–400)
RBC: 4.11 MIL/uL (ref 3.87–5.11)
RDW: 14.6 % (ref 11.5–15.5)
WBC: 10 K/uL (ref 4.0–10.5)
nRBC: 0 % (ref 0.0–0.2)

## 2024-06-22 MED ORDER — ENOXAPARIN SODIUM 40 MG/0.4ML IJ SOSY: 40.0000 mg | PREFILLED_SYRINGE | INTRAMUSCULAR | Status: DC

## 2024-06-22 MED ORDER — ACETAMINOPHEN 650 MG RE SUPP: 650.0000 mg | RECTAL | Status: DC | PRN

## 2024-06-22 MED ORDER — BUDESON-GLYCOPYRROL-FORMOTEROL 160-9-4.8 MCG/ACT IN AERO
2.0000 | INHALATION_SPRAY | Freq: Two times a day (BID) | RESPIRATORY_TRACT | Status: DC
  Administered 2024-06-22 – 2024-07-02 (×20): 2 via RESPIRATORY_TRACT
  Filled 2024-06-22: qty 5.9

## 2024-06-22 MED ORDER — CLOPIDOGREL BISULFATE 75 MG PO TABS
75.0000 mg | ORAL_TABLET | Freq: Every day | ORAL | Status: DC
  Administered 2024-06-23 – 2024-07-02 (×10): 75 mg via ORAL
  Filled 2024-06-22 (×10): qty 1

## 2024-06-22 MED ORDER — LEVETIRACETAM 500 MG PO TABS: 500.0000 mg | ORAL_TABLET | Freq: Two times a day (BID) | ORAL | Status: DC

## 2024-06-22 MED ORDER — ALBUTEROL SULFATE (2.5 MG/3ML) 0.083% IN NEBU: 2.5000 mg | INHALATION_SOLUTION | RESPIRATORY_TRACT | Status: DC | PRN

## 2024-06-22 MED ORDER — ACETAMINOPHEN 160 MG/5ML PO SOLN: 650.0000 mg | ORAL | Status: DC | PRN

## 2024-06-22 MED ORDER — METOPROLOL SUCCINATE ER 25 MG PO TB24: 25.0000 mg | ORAL_TABLET | Freq: Every day | ORAL | Status: DC

## 2024-06-22 MED ORDER — TRAZODONE HCL 50 MG PO TABS
25.0000 mg | ORAL_TABLET | Freq: Every day | ORAL | Status: DC
  Administered 2024-06-22 – 2024-06-30 (×9): 25 mg via ORAL
  Filled 2024-06-22 (×9): qty 1

## 2024-06-22 MED ORDER — PANTOPRAZOLE SODIUM 40 MG PO TBEC
40.0000 mg | DELAYED_RELEASE_TABLET | Freq: Every day | ORAL | Status: DC
  Administered 2024-06-23 – 2024-07-02 (×10): 40 mg via ORAL
  Filled 2024-06-22 (×10): qty 1

## 2024-06-22 MED ORDER — ENOXAPARIN SODIUM 40 MG/0.4ML IJ SOSY
40.0000 mg | PREFILLED_SYRINGE | Freq: Every day | INTRAMUSCULAR | Status: DC
  Administered 2024-06-24 – 2024-06-30 (×7): 40 mg via SUBCUTANEOUS
  Filled 2024-06-22 (×8): qty 0.4

## 2024-06-22 MED ORDER — SENNOSIDES-DOCUSATE SODIUM 8.6-50 MG PO TABS: 1.0000 | ORAL_TABLET | Freq: Every evening | ORAL | Status: DC | PRN

## 2024-06-22 MED ORDER — OXYCODONE HCL 5 MG PO TABS
5.0000 mg | ORAL_TABLET | Freq: Four times a day (QID) | ORAL | Status: DC | PRN
  Administered 2024-06-22 – 2024-06-26 (×5): 5 mg via ORAL
  Filled 2024-06-22 (×6): qty 1

## 2024-06-22 MED ORDER — ROSUVASTATIN CALCIUM 20 MG PO TABS
20.0000 mg | ORAL_TABLET | Freq: Every day | ORAL | Status: DC
  Administered 2024-06-23 – 2024-07-02 (×10): 20 mg via ORAL
  Filled 2024-06-22 (×10): qty 1

## 2024-06-22 MED ORDER — MELATONIN 5 MG PO TABS: 5.0000 mg | ORAL_TABLET | Freq: Every evening | ORAL | Status: DC | PRN

## 2024-06-22 MED ORDER — SENNOSIDES-DOCUSATE SODIUM 8.6-50 MG PO TABS
1.0000 | ORAL_TABLET | Freq: Every evening | ORAL | Status: DC | PRN
  Administered 2024-06-23: 1 via ORAL
  Filled 2024-06-22: qty 1

## 2024-06-22 MED ORDER — DULOXETINE HCL 30 MG PO CPEP
30.0000 mg | ORAL_CAPSULE | Freq: Two times a day (BID) | ORAL | Status: DC
  Administered 2024-06-22 – 2024-07-02 (×20): 30 mg via ORAL
  Filled 2024-06-22 (×20): qty 1

## 2024-06-22 MED ORDER — ACETAMINOPHEN 325 MG PO TABS
650.0000 mg | ORAL_TABLET | ORAL | Status: DC | PRN
  Administered 2024-06-22 – 2024-07-02 (×10): 650 mg via ORAL
  Filled 2024-06-22 (×11): qty 2

## 2024-06-22 MED ORDER — LEFLUNOMIDE 10 MG PO TABS
20.0000 mg | ORAL_TABLET | Freq: Every day | ORAL | Status: DC
  Administered 2024-06-23 – 2024-07-02 (×10): 20 mg via ORAL
  Filled 2024-06-22 (×10): qty 2

## 2024-06-22 MED ORDER — LEVETIRACETAM 500 MG PO TABS
500.0000 mg | ORAL_TABLET | Freq: Two times a day (BID) | ORAL | Status: DC
  Administered 2024-06-22 – 2024-07-02 (×20): 500 mg via ORAL
  Filled 2024-06-22 (×20): qty 1

## 2024-06-22 MED ORDER — CLONAZEPAM 0.125 MG PO TBDP
0.2500 mg | ORAL_TABLET | Freq: Every day | ORAL | Status: DC | PRN
  Administered 2024-06-22 – 2024-06-25 (×3): 0.25 mg via ORAL
  Filled 2024-06-22 (×5): qty 2

## 2024-06-22 MED ORDER — ASPIRIN 81 MG PO TBEC
81.0000 mg | DELAYED_RELEASE_TABLET | Freq: Every day | ORAL | Status: DC
  Administered 2024-06-23 – 2024-07-02 (×10): 81 mg via ORAL
  Filled 2024-06-22 (×10): qty 1

## 2024-06-22 MED ORDER — OXYCODONE HCL 5 MG PO TABS: 5.0000 mg | ORAL_TABLET | Freq: Four times a day (QID) | ORAL | Status: DC | PRN

## 2024-06-22 NOTE — Progress Notes (Signed)
   Inpatient Rehabilitation Admissions Coordinator   I have insurance approval and CIR bed to admit her to today. I met with patient at bedside and contacted her daughter by p[one. They are in agreement. Acute team and TOC made aware. I will make the arrangements.  Heron Leavell, RN, MSN Rehab Admissions Coordinator [phone] 06/22/2024 10:27 AM

## 2024-06-22 NOTE — Progress Notes (Signed)
 Babs Arthea DASEN, MD  Physician Physical Medicine and Rehabilitation   Consult Note    Signed   Date of Service: 06/18/2024 11:03 AM  Related encounter: ED to Hosp-Admission (Current) from 06/16/2024 in [facility]   Signed     Expand All Collapse All  Show:Clear all [x] Written[x] Templated[] Copied  Added by: [x] Babs Arthea DASEN, MD  [] Hover for details          Physical Medicine and Rehabilitation Consult Reason for Consult:impaired functional mobility Referring Physician: Drusilla     HPI: Betty Golden is a 78 y.o. female with a hx of HTN, prior CVA (residual aphasia), GERD, COPD, FMS, RA who presented on 11/19 after a fall at home. CTH revealed no abnl. CT cervical spine negative. MRI brain revealed acute infarct in the right precentral gyrus and subacute to chronic  infarcts in the left frontoparietal area, right periatrial white matter,  basal ganglia bilaterally. CTA demonstrate ongoing left M2 occlusion but no new LVO. Neurology recommends asa 81 and plavix  75mg  daily for 3 mos then plavix  alone. Loop recorder placed. Pt has been up with therapy and was CGA for sit to dstand and walked 20' min-mod assist. She was min to max assist with basic ADL's yesterday. Pt lives with family, two level home with 1 step to enter. Pt used RW prior to admit with assistance. Also needed assistance for bathing, dressing.        Home: Home Living Family/patient expects to be discharged to:: Private residence Living Arrangements:  (daughter and son in social worker) Available Help at Discharge: Family, Available 24 hours/day Type of Home: House Home Access: Stairs to enter [employer] of Steps: 1 stair to the garage with 1/2 rise to entrance is her best option opposed to front entrance Entrance Stairs-Rails: Right, Left Home Layout: Two level, Able to live on main level with bedroom/bathroom, Full bath on main level, Other (Comment) Alternate Level  Stairs-Number of Steps: does not go upstairs or basement Bathroom Shower/Tub: Health Visitor: Standard Bathroom Accessibility: Yes Home Equipment: [employer] (2 wheels), Wheelchair - manual, [place] - single point, BSC/3in1 Additional Comments: confirmed all info with pt again this admission 06/17/24  Lives With: Family, Daughter  Functional History: Prior Function Prior Level of Function : Needs assist Mobility Comments: reports use of RW and assistance for ambulation, reports she is sedentary ADLs Comments: mostly sponge bathes, does not like the feeling of shower on her body, daughter bathes her, assists with dressing and does all IADLs for pt Functional Status:  Mobility: Bed Mobility Overal bed mobility: Needs Assistance Bed Mobility: Supine to Sit, Sit to Supine Supine to sit: Supervision Sit to supine: Supervision General bed mobility comments: head of stretcher up Transfers Overall transfer level: Needs assistance Equipment used: 1 person hand held assist Transfers: Sit to/from Stand, Bed to chair/wheelchair/BSC Sit to Stand: Min assist Bed to/from chair/wheelchair/BSC transfer type:: Step pivot Step pivot transfers: Min assist General transfer comment: steadying assist Ambulation/Gait Ambulation/Gait assistance: Min assist, Mod assist Gait Distance (Feet): 20 Feet Assistive device: Rolling walker (2 wheels) Gait Pattern/deviations: Decreased step length - right, Decreased step length - left, Decreased stride length, Decreased dorsiflexion - right, Decreased dorsiflexion - left, Knee flexed in stance - left, Knee flexed in stance - right, Antalgic, Trunk flexed, Step-to pattern, Decreased stance time - right, Decreased weight shift to right General Gait Details: Pt with slow, antalgic gait pattern with a flexed trunk and bil knee flexion in  stance phase. Verbal and tactile cues provided to extend knees with only momentary success. Pt slides bil feet to  advance them when stepping rather than clearing either foot. Pt with x1 posterior LOB needing modA to recover, otherwise minA for balance throughout. Pt limiting distance due to R leg pain Gait velocity: reduced Gait velocity interpretation: <1.31 ft/sec, indicative of household ambulator   ADL: ADL Overall ADL's : Needs assistance/impaired Eating/Feeding: Set up, Bed level Eating/Feeding Details (indicate cue type and reason): only observed drinking with straw Grooming: Wash/dry hands, Wash/dry face, Sitting, Supervision/safety Upper Body Bathing: Minimal assistance, Sitting Lower Body Bathing: Maximal assistance, Sit to/from stand Upper Body Dressing : Set up, Sitting Lower Body Dressing: Minimal assistance, Sitting/lateral leans Lower Body Dressing Details (indicate cue type and reason): long sitting, assist for R sock over toes, can perform figure 4 to reach L foot Toilet Transfer: Minimal assistance, Stand-pivot, BSC/3in1 Toileting- Clothing Manipulation and Hygiene: Moderate assistance, Sit to/from stand   Cognition: Cognition Overall Cognitive Status: History of cognitive impairments - at baseline Arousal/Alertness: Awake/alert Orientation Level: Oriented X4 Year: 2025 Day of Week: Correct Attention: Focused, Sustained Focused Attention: Appears intact Sustained Attention: Appears intact Memory: Appears intact Awareness: Appears intact Problem Solving: Impaired Problem Solving Impairment: Functional basic (calculations) Cognition Arousal: Alert Behavior During Therapy: WFL for tasks assessed/performed Overall Cognitive Status: History of cognitive impairments - at baseline     Review of Systems  Unable to perform ROS: Language       Past Medical History:  Diagnosis Date   Acute ischemic left middle cerebral artery (MCA) stroke (HCC) 04/17/2024   Asthma     C. difficile colitis     Cerebrovascular accident (CVA) due to embolism of precerebral artery (HCC)  05/25/2024   Cervical stenosis of spine     Chronic headaches     Chronic neck pain     Colitis due to Clostridium difficile 02/10/2015   Coronary artery disease     Depression     Diverticulosis     Essential hypertension     Fibromyalgia     GERD (gastroesophageal reflux disease)     History of CVA (cerebrovascular accident) 05/06/2024   History of Salmonella gastroenteritis     HNP (herniated nucleus pulposus), lumbar     Hyperlipidemia     IBS (irritable bowel syndrome)     Lung nodule     rheumatoid arthritis     Sepsis (HCC) 05/22/2024   Sepsis due to pneumonia (HCC) 05/06/2024             Past Surgical History:  Procedure Laterality Date   APPENDECTOMY   1965   BIOPSY   08/26/2018    Procedure: BIOPSY;  Surgeon: Golda Claudis PENNER, MD;  Location: [facility];  Service: Endoscopy;;  gastric   BREAST BIOPSY       BREAST REDUCTION SURGERY   1994   CATARACT EXTRACTION W/PHACO Left 06/09/2023    Procedure: CATARACT EXTRACTION PHACO AND INTRAOCULAR LENS PLACEMENT (IOC);  Surgeon: Harrie Agent, MD;  Location: [facility];  Service: Ophthalmology;  Laterality: Left;  CDE 7.03   CATARACT EXTRACTION W/PHACO Right 06/23/2023    Procedure: CATARACT EXTRACTION PHACO AND INTRAOCULAR LENS PLACEMENT (IOC);  Surgeon: Harrie Agent, MD;  Location: [facility];  Service: Ophthalmology;  Laterality: Right;  CDE: 11.55   CHOLECYSTECTOMY   1975   COLONOSCOPY N/A 08/19/2013    Procedure: COLONOSCOPY;  Surgeon: Claudis PENNER Golda, MD;  Location: [facility];  Service: Endoscopy;  Laterality: N/A;  155-moved to 140 Ann to notify pt   COLONOSCOPY N/A 08/26/2018    Procedure: COLONOSCOPY;  Surgeon: Golda Claudis PENNER, MD;  Location: [facility];  Service: Endoscopy;  Laterality: N/A;   ESOPHAGOGASTRODUODENOSCOPY N/A 08/26/2018    Procedure: ESOPHAGOGASTRODUODENOSCOPY (EGD);  Surgeon: Golda Claudis PENNER, MD;  Location: [facility];  Service: Endoscopy;  Laterality: N/A;   fibromyalgia       LOOP RECORDER  INSERTION N/A 05/26/2024    Procedure: LOOP RECORDER INSERTION;  Surgeon: Lesia Ozell Barter, PA-C;  Location: [facility] INVASIVE CV LAB;  Service: Cardiovascular;  Laterality: N/A;   LUMBAR LAMINECTOMY/DECOMPRESSION MICRODISCECTOMY Bilateral 09/20/2015    Procedure: MICRO LUMBAR DECOMPRESSION L5-S1 BILATERAL    (1 LEVEL);  Surgeon: Reyes Billing, MD;  Location: [facility];  Service: Orthopedics;  Laterality: Bilateral;   rheumatoid arthritis       Sinus Surgery       TONSILLECTOMY   1968   TRANSESOPHAGEAL ECHOCARDIOGRAM (CATH LAB) N/A 05/25/2024    Procedure: TRANSESOPHAGEAL ECHOCARDIOGRAM;  Surgeon: Lonni Slain, MD;  Location: [facility] INVASIVE CV LAB;  Service: Cardiovascular;  Laterality: N/A;             Family History  Problem Relation Age of Onset   Heart disease Mother     Hypertension Mother     Asthma Mother     Heart disease Father     Stroke Father     Hypertension Father     Stroke Sister     Asthma Sister     Hypertension Brother     Asthma Brother     Asthma Maternal Grandmother     Heart disease Maternal Grandmother     Diabetes Maternal Grandfather     Asthma Maternal Aunt     Colon cancer Neg Hx          Social History:  reports that she quit smoking about 35 years ago. Her smoking use included cigarettes. She started smoking about 45 years ago. She has a 20 pack-year smoking history. She has never used smokeless tobacco. She reports that she does not drink alcohol and does not use drugs. Allergies:  Allergies      Allergies  Allergen Reactions   Hydromorphone  Anaphylaxis   Sulfa Antibiotics Rash            Medications Prior to Admission  Medication Sig Dispense Refill   acetaminophen  (TYLENOL ) 325 MG tablet Take 1-2 tablets (325-650 mg total) by mouth every 4 (four) hours as needed for mild pain (pain score 1-3). (Patient taking differently: Take 650 mg by mouth daily as needed for mild pain (pain score 1-3) or moderate pain (pain score 4-6).)        albuterol  (VENTOLIN  HFA) 108 (90 Base) MCG/ACT inhaler INHALE 2 PUFFS INTO THE LUNGS EVERY 4 HOURS AS NEEDED FOR WHEEZING OR SHORTNESS OF BREATH 8 g 0   amLODipine  (NORVASC ) 10 MG tablet Take 1 tablet (10 mg total) by mouth daily. 30 tablet 0   aspirin  EC 81 MG tablet Take 1 tablet (81 mg total) by mouth daily. Swallow whole. 30 tablet 12   clonazePAM  (KLONOPIN ) 0.25 MG disintegrating tablet Take 0.25 mg by mouth daily.       clopidogrel  (PLAVIX ) 75 MG tablet Take 1 tablet (75 mg total) by mouth daily. 90 tablet 0   colestipol  (COLESTID ) 1 g tablet Take 1 g by mouth daily.       DULoxetine  (CYMBALTA ) 30 MG capsule Take 1 capsule (  30 mg total) by mouth 2 (two) times daily. 60 capsule 0   ferrous sulfate  325 (65 FE) MG EC tablet Take 325 mg by mouth daily.       Fluticasone -Umeclidin-Vilant (TRELEGY ELLIPTA ) 200-62.5-25 MCG/ACT AEPB Inhale 1 puff into the lungs in the morning. 90 each 3   leflunomide  (ARAVA ) 20 MG tablet Take 20 mg by mouth daily.       losartan -hydrochlorothiazide  (HYZAAR) 100-12.5 MG tablet Take 1 tablet by mouth daily.       meclizine  (ANTIVERT ) 12.5 MG tablet Take 12.5 mg by mouth daily.       melatonin 5 MG TABS Take 1 tablet (5 mg total) by mouth at bedtime. 30 tablet 0   metoprolol  succinate (TOPROL  XL) 50 MG 24 hr tablet Take 1 tablet (50 mg total) by mouth daily. 30 tablet 0   pantoprazole  (PROTONIX ) 40 MG tablet Take 1 tablet (40 mg total) by mouth daily. 30 tablet 0   potassium chloride  (KLOR-CON  M) 10 MEQ tablet Take 1 tablet (10 mEq total) by mouth daily.       pregabalin  (LYRICA ) 25 MG capsule Take 25 mg by mouth 2 (two) times daily.       rosuvastatin  (CRESTOR ) 20 MG tablet Take 1 tablet (20 mg total) by mouth daily. 30 tablet 0   traZODone  (DESYREL ) 50 MG tablet Take 0.5 tablets (25 mg total) by mouth at bedtime. 30 tablet 0   benralizumab  (FASENRA ) 30 MG/ML prefilled syringe Inject 1 mL (30 mg total) into the skin every 8 (eight) weeks. (Patient not taking: Reported  on 06/17/2024) 1 mL 6   Certolizumab Pegol (CIMZIA Stuart) Inject 1 Dose into the skin every 14 (fourteen) days. (Patient not taking: Reported on 06/14/2024)       clindamycin (CLEOCIN T) 1 % lotion Apply 1 Application topically 2 (two) times daily. (Patient not taking: Reported on 06/17/2024)                Blood pressure (!) 154/69, pulse (!) 120, temperature 98 F (36.7 C), temperature source Oral, resp. rate 18, height 5' 3 (1.6 m), weight 59 kg, SpO2 98%. Physical Exam Constitutional:      General: She is not in acute distress. HENT:     Head: Normocephalic and atraumatic.     Right Ear: External ear normal.     Left Ear: External ear normal.     Mouth/Throat:     Mouth: Mucous membranes are moist.  Eyes:     Extraocular Movements: Extraocular movements intact.     Pupils: Pupils are equal, round, and reactive to light.  Cardiovascular:     Rate and Rhythm: Regular rhythm. Tachycardia present.  Pulmonary:     Effort: Pulmonary effort is normal. No respiratory distress.     Breath sounds: No wheezing.  Abdominal:     General: Bowel sounds are normal. There is no distension.     Tenderness: There is no abdominal tenderness.  Musculoskeletal:     Cervical back: Normal range of motion.  Skin:    General: Skin is warm.  Neurological:     Mental Status: She is alert.     Comments: Pt alert. Oriented to name, October, year. Cognitive assessment limited by aphasia which is expressive > receptive. Speaks in small sentences, phrases. Seems to comprehend about 50% depending upon the complexity. Speech is clear. No focal CN signs. Moves all 4's at least 4- to 4/5. Sensed pain in all 4's. Motor/sensory exam a little limited  due to aphasia. No abnl resting tone. No ataxia  Psychiatric:        Mood and Affect: Mood normal.       Lab Results Last 24 Hours        Results for orders placed or performed during the hospital encounter of 06/16/24 (from the past 24 hours)  Urine Culture  (for pregnant, neutropenic or urologic patients or patients with an indwelling urinary catheter)     Status: Abnormal    Collection Time: 06/17/24 11:09 AM    Specimen: Urine, Clean Catch  Result Value Ref Range    Specimen Description URINE, CLEAN CATCH      Special Requests          NONE Performed at [facility], [address] [place], [place] [address]      Culture MULTIPLE SPECIES PRESENT, SUGGEST RECOLLECTION (A)      Report Status 06/18/2024 FINAL         Imaging Results (Last 48 hours)  EEG adult Result Date: 06/17/2024 Shelton Arlin KIDD, MD     06/17/2024  3:43 AM Patient Name: Betty BEDOYA MRN: [id number] Epilepsy Attending: Arlin KIDD Shelton Referring Physician/Provider: Waddell Karna LABOR, NP Date: 06/17/2024 Duration: 21.35 mins Patient history: 42uo F with episode of staring. EEG to evaluate for seizure Level of alertness: Awake AEDs during EEG study: LEV Technical aspects: This EEG study was done with scalp electrodes positioned according to the 10-20 International system of electrode placement. Electrical activity was reviewed with band pass filter of 1-70Hz , sensitivity of 7 uV/mm, display speed of 73mm/sec with a 60Hz  notched filter applied as appropriate. EEG data were recorded continuously and digitally stored.  Video monitoring was available and reviewed as appropriate. Description: The posterior dominant rhythm consists of 8 Hz activity of moderate voltage (25-35 uV) seen predominantly in posterior head regions, symmetric and reactive to eye opening and eye closing. Physiologic photic driving was not seen during photic stimulation.  Hyperventilation was  not performed.   IMPRESSION: This study is within normal limits. No seizures or epileptiform discharges were seen throughout the recording. A normal interictal EEG does not exclude the diagnosis of epilepsy. Arlin KIDD Shelton    MR BRAIN WO CONTRAST Result Date: 06/16/2024 EXAM: MRI BRAIN WITHOUT CONTRAST 06/16/2024  02:43:21 PM TECHNIQUE: Multiplanar multisequence MRI of the head/brain was performed without the administration of intravenous contrast. COMPARISON: Head CT 06/16/2024 and MRI 05/23/2024. CLINICAL HISTORY: Stroke, follow up. Recent fall. Aphasia. History of stroke. FINDINGS: BRAIN AND VENTRICLES: A punctate focus of restricted diffusion involving cortex in the right precentral gyrus is new from the prior MRI and consistent with an acute infarct. The cortical and subcortical infarction in the left frontoparietal region has further evolved from the prior MRI with progressive encephalomalacia, and a smaller late subacute to chronic infarct is again noted in the right periatrial white matter. Restricted diffusion associated with the subcentimeter acute left cerebellar infarct on the prior MRI has resolved. T2 hyperintensities elsewhere in the cerebral white matter bilaterally and in the pons are unchanged and nonspecific but compatible with mild chronic small vessel ischemic disease. Chronic lacunar infarcts are noted in the basal ganglia bilaterally. No intracranial hemorrhage, mass, midline shift, hydrocephalus, or extra axial fluid collection is identified. There is mild cerebral atrophy. Major intracranial vascular flow voids are preserved. ORBITS: Bilateral cataract extraction. SINUSES AND MASTOIDS: Small right mastoid effusion. Clear paranasal sinuses. BONES AND SOFT TISSUES: Normal marrow signal. No acute soft tissue abnormality. IMPRESSION:  1. Punctate acute infarct in the right precentral gyrus. 2. Chronic ischemia with multiple old infarcts as above, including evolving late subacute to chronic bilateral MCA infarcts. Electronically signed by: Dasie Hamburg MD 06/16/2024 03:21 PM EST RP Workstation: [id number]    DG Ankle 2 Views Left Result Date: 06/16/2024 EXAM: 2 VIEW(S) XRAY OF THE LEFT ANKLE 06/16/2024 01:51:00 PM CLINICAL HISTORY: trauma COMPARISON: None available. FINDINGS: BONES AND JOINTS: No acute  fracture. No focal osseous lesion. No joint dislocation. SOFT TISSUES: The soft tissues are unremarkable. IMPRESSION: 1. No acute osseous abnormality. Electronically signed by: Lynwood Seip MD 06/16/2024 02:16 PM EST RP Workstation: [id number]    DG Chest Port 1 View Result Date: 06/16/2024 EXAM: 1 VIEW(S) XRAY OF THE CHEST 06/16/2024 01:51:00 PM COMPARISON: 05/22/2024 CLINICAL HISTORY: Trauma FINDINGS: LUNGS AND PLEURA: Stable left basilar scarring. No pleural effusion. No pneumothorax. HEART AND MEDIASTINUM: No acute abnormality of the cardiac and mediastinal silhouettes. BONES AND SOFT TISSUES: No acute osseous abnormality. IMPRESSION: 1. No acute cardiopulmonary process. Electronically signed by: Lynwood Seip MD 06/16/2024 02:14 PM EST RP Workstation: [id number]    CT CHEST ABDOMEN PELVIS W CONTRAST Result Date: 06/16/2024 CLINICAL DATA:  Syncope with fall hitting back of head and neck. Abdominal pain. EXAM: CT CHEST, ABDOMEN, AND PELVIS WITH CONTRAST TECHNIQUE: Multidetector CT imaging of the chest, abdomen and pelvis was performed following the standard protocol during bolus administration of intravenous contrast. RADIATION DOSE REDUCTION: This exam was performed according to the departmental dose-optimization program which includes automated exposure control, adjustment of the mA and/or kV according to patient size and/or use of iterative reconstruction technique. CONTRAST:  75mL OMNIPAQUE  IOHEXOL  350 MG/ML SOLN COMPARISON:  CTA chest 05/22/2024 and CT abdomen/pelvis 05/27/2024, CT chest/abdomen/pelvis 09/25/2021 FINDINGS: CT CHEST FINDINGS Cardiovascular: Heart is normal size. Calcification of the mitral valve annulus. Thoracic aorta is normal in caliber without evidence of aneurysm or dissection. Pulmonary arterial system is unremarkable. Remaining vascular structures are normal. Mediastinum/Nodes: No mediastinal or hilar adenopathy. Mediastinal structures are otherwise unremarkable. Lungs/Pleura:  Lungs are adequately inflated and demonstrate mild centrilobular emphysematous disease. Stable mild chronic peripheral reticular density over the upper lobes. No acute airspace process or effusion. Airways are normal. 3 mm subpleural nodule over the lateral left lower lobe unchanged from 2023 and therefore considered benign. Musculoskeletal: No acute fracture. CT ABDOMEN PELVIS FINDINGS Hepatobiliary: Prior cholecystectomy. Mild steatosis of the liver without focal mass. Biliary tree is normal. Pancreas: Normal. Spleen: Normal. Adrenals/Urinary Tract: Adrenal glands are normal. Kidneys are normal in size without hydronephrosis or nephrolithiasis. Right renal cyst unchanged. Bladder and ureters are normal. Stomach/Bowel: Stomach and small bowel are normal. Prior appendectomy. Moderate diverticulosis over the sigmoid colon and distal descending colon. No active inflammation. Mild diverticulosis over the right colon. Vascular/Lymphatic: Minimal calcified plaque over the abdominal aorta which is normal in caliber. Remaining vascular structures are unremarkable. No adenopathy. Reproductive: Uterus and bilateral adnexa are unremarkable. Other: No free fluid or focal inflammatory change. Musculoskeletal: No acute fracture. IMPRESSION: 1. No acute findings in the chest, abdomen or pelvis. 2. Mild centrilobular emphysematous disease. 3. Mild hepatic steatosis. 4. Colonic diverticulosis without active inflammation. 5. Aortic atherosclerosis. Aortic Atherosclerosis (ICD10-I70.0) and Emphysema (ICD10-J43.9). Electronically Signed   By: Toribio Agreste M.D.   On: 06/16/2024 13:51    CT Cervical Spine Wo Contrast Result Date: 06/16/2024 EXAM: CT CERVICAL SPINE WITH CONTRAST 06/16/2024 01:19:54 PM TECHNIQUE: CT of the cervical spine was performed with the administration of 75 mL of iohexol  (OMNIPAQUE ) 350 MG/ML injection.  Multiplanar reformatted images are provided for review. Automated exposure control, iterative  reconstruction, and/or weight based adjustment of the mA/kV was utilized to reduce the radiation dose to as low as reasonably achievable. COMPARISON: Cervical spine 09/25/2021. CLINICAL HISTORY: Trauma. Mechanical fall secondary to dizziness. FINDINGS: CERVICAL SPINE: BONES AND ALIGNMENT: No acute fracture or traumatic malalignment. 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 Anterior osteophytes at C5-C6 and C6-C7 are stable. Uncovertebral spurring contributes to moderate left foraminal stenosis at C3-C4, C4-C5, and C5-C6. SOFT TISSUES: No prevertebral soft tissue swelling. Atherosclerotic calcifications are present in the carotid bifurcations bilaterally without significant stenosis. IMPRESSION: 1. No acute cervical spine injury related to the reported trauma. 2. 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 3. Moderate left neural foraminal stenosis at C3-4, C4-5, and C5-6. Electronically signed by: Lonni Necessary MD 06/16/2024 01:33 PM EST RP Workstation: [id number]    CT HEAD CODE STROKE WO CONTRAST (LKW 0-4.5h, LVO 0-24h) Result Date: 06/16/2024 EXAM: CT HEAD WITHOUT 06/16/2024 12:52:32 PM TECHNIQUE: CT of the head was performed without the administration of intravenous contrast. Automated exposure control, iterative reconstruction, and/or weight based adjustment of the mA/kV was utilized to reduce the radiation dose to as low as reasonably achievable. COMPARISON: 05/27/2024 CLINICAL HISTORY: Neuro deficit, acute, stroke suspected FINDINGS: BRAIN AND VENTRICLES: No acute intracranial hemorrhage. No mass effect or midline shift. No extra-axial fluid collection. No evidence of acute infarct. Chronic left parietal lobe infarct. Scattered hypoattenuating foci in supratentorial white matter, nonspecific but most commonly related to chronic microangiopathic changes. No hydrocephalus. ORBITS: No acute abnormality. SINUSES AND MASTOIDS: No  acute abnormality. SOFT TISSUES AND SKULL: No acute skull fracture. No acute soft tissue abnormality. LIMITATIONS/ARTIFACTS: Motion artifact limiting evaluation. IMPRESSION: 1. No acute intracranial abnormality. 2. Chronic left parietal lobe infarct. 3. Motion artifact limiting evaluation. 4. These results were communicated to Dr. Matthews at 1:04 PM on 06/16/2024 by secure text page via the [facility] messaging system. Electronically signed by: Lonni Necessary MD 06/16/2024 01:05 PM EST RP Workstation: [id number]       Assessment/Plan: Diagnosis: 33 female with acute right precentral gyrus infarct in the setting of other subacute/chronic, likely cardioembolic infarcts.  Does the need for close, 24 hr/day medical supervision in concert with the patient's rehab needs make it unreasonable for this patient to be served in a less intensive setting? Yes Co-Morbidities requiring supervision/potential complications:  -HTN -RA,FMS -COPD -CAD, CHF Due to bladder management, bowel management, safety, skin/wound care, disease management, medication administration, and patient education, does the patient require 24 hr/day rehab nursing? Yes Does the patient require coordinated care of a physician, rehab nurse, therapy disciplines of PT, OT, SLP to address physical and functional deficits in the context of the above medical diagnosis(es)? Yes Addressing deficits in the  following areas: balance, endurance, locomotion, strength, transferring, bowel/bladder control, bathing, dressing, feeding, grooming, toileting, cognition, language, and psychosocial support Can the patient actively participate in an intensive therapy program of at least 3 hrs of therapy per day at least 5 days per week? Yes The potential for patient to make measurable gains while on inpatient rehab is excellent Anticipated functional outcomes upon discharge from inpatient rehab are supervision  with PT, supervision and min assist with OT, min assist  and mod assist with SLP. Estimated rehab length of stay to reach the above functional goals is: 7 days Anticipated discharge destination: Home Overall Rehab/Functional Prognosis: excellent   POST ACUTE RECOMMENDATIONS: This patient's condition is  appropriate for continued rehabilitative care in the following setting: CIR Patient has agreed to participate in recommended program. Yes Note that insurance prior authorization may be required for reimbursement for recommended care.   Comment: I'm unclear how much assistance she was needing prior to this admit. Rehab admissions coordinator will follow up with daughter and find out about her prior functional levels, potential goals for inpatient rehab, etc.      I have personally performed a face to face diagnostic evaluation of this patient. Additionally, I have examined the patient's medical record including any pertinent labs and radiographic images.     Thanks,   Arthea ONEIDA Gunther, MD 06/18/2024          Routing History

## 2024-06-22 NOTE — Plan of Care (Signed)
  Problem: Consults Goal: RH STROKE PATIENT EDUCATION Description: See Patient Education module for education specifics  Outcome: Progressing   Problem: Consults Goal: Nutrition Consult-if indicated Outcome: Progressing   Problem: RH BOWEL ELIMINATION Goal: RH STG MANAGE BOWEL WITH ASSISTANCE Description: STG Manage Bowel with mod I Assistance. Outcome: Progressing   Problem: RH BOWEL ELIMINATION Goal: RH STG MANAGE BOWEL W/MEDICATION W/ASSISTANCE Description: STG Manage Bowel with Medication with mod I Assistance. Outcome: Progressing   Problem: RH SAFETY Goal: RH STG ADHERE TO SAFETY PRECAUTIONS W/ASSISTANCE/DEVICE Description: STG Adhere to Safety Precautions With cues Assistance/Device. 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

## 2024-06-22 NOTE — Progress Notes (Signed)
 Occupational Therapy Treatment Patient Details Name: Betty Golden MRN: [id number] DOB: 1946/03/29 Today's Date: 06/22/2024   History of present illness Pt is a 78 y.o. female who presented 06/16/24 after getting dizzy and falling. 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 Patient able to doff socks and bathe feet while seated on EOB with assistance to donn clean socks. Patient ambulated to sink for self care with verbal cues for sequencing and locating items on right.  Patient able to perform toilet transfer with min assist and cues for walker safety.  Patient will benefit from intensive inpatient follow-up therapy, >3 hours/day.  Acute OT to continue to follow to address established goals to facilitate DC to next venue of care.        If plan is discharge home, recommend the following:  A lot of help with bathing/dressing/bathroom;Assist for transportation;Help with stairs or ramp for entrance;Assistance with cooking/housework;Direct supervision/assist for medications management;Direct supervision/assist for financial management;A little help with walking and/or transfers   Equipment Recommendations  None recommended by OT    Recommendations for Other Services      Precautions / Restrictions Precautions Precautions: Fall Recall of Precautions/Restrictions: Impaired Precaution/Restrictions Comments: aphasia at baseline, watch HR Restrictions Weight Bearing Restrictions Per Provider Order: No       Mobility Bed Mobility Overal bed mobility: Needs Assistance Bed Mobility: Supine to Sit     Supine to sit: Min assist     General bed mobility comments: required  assistance with BLE and increased time    Transfers Overall transfer level: Needs assistance Equipment used: Rolling walker (2 wheels) Transfers: Sit to/from Stand Sit to Stand: Min assist           General transfer comment: cues for hand placement and safety with min assist to power up     Balance Overall balance assessment: Needs assistance Sitting-balance support: No upper extremity supported, Feet supported Sitting balance-Leahy Scale: Good Sitting balance - Comments: addressed doffing and donning socks seated on EOB   Standing balance support: Single extremity supported, Bilateral upper extremity supported, During functional activity Standing balance-Leahy Scale: Poor Standing balance comment: reliant on RW or sink for support                           ADL either performed or assessed with clinical judgement   ADL Overall ADL's : Needs assistance/impaired     Grooming: Wash/dry hands;Wash/dry face;Oral care;Contact guard assist;Cueing for sequencing;Standing   Upper Body Bathing: Minimal assistance;Sitting   Lower Body Bathing: Moderate assistance;Sit to/from stand Lower Body Bathing Details (indicate cue type and reason): able to bathe feet with figure 4 and mod assist for peri area bathing Upper Body Dressing : Set up;Sitting Upper Body Dressing Details (indicate cue type and reason): gown change Lower Body Dressing: Moderate assistance;Sitting/lateral leans Lower Body Dressing Details (indicate cue type and reason): patient able to doff socks but required assistance to [employer] Transfer: Minimal assistance;Ambulation;Regular Toilet;Rolling walker (2 wheels) Toilet Transfer Details (indicate cue type and reason): cues for safety Toileting- Clothing Manipulation and Hygiene: Minimal assistance;Sit to/from stand       Functional mobility during ADLs: Minimal assistance;Rolling walker (2 wheels)  Extremity/Trunk Assessment               Vision   Additional Comments: required cues to locate items located on right   Perception     Praxis     Communication Communication Communication: Impaired Factors Affecting Communication: Difficulty expressing self   Cognition Arousal: Alert Behavior During Therapy: WFL for tasks assessed/performed Cognition: No family/caregiver present to determine baseline             OT - Cognition Comments: required cues for sequencing with self care tasks                 Following commands: Impaired Following commands impaired: Follows one step commands inconsistently, Follows one step commands with increased time      Cueing   Cueing Techniques: Verbal cues  Exercises      Shoulder Instructions       General Comments HR increased to 120s when standing    Pertinent Vitals/ Pain       Pain Assessment Pain Assessment: Faces Faces Pain Scale: Hurts a little bit Pain Location: L ankle Pain Descriptors / Indicators: Aching Pain Intervention(s): Monitored during session, Repositioned  Home Living                                          Prior Functioning/Environment              Frequency  Min 2X/week        Progress Toward Goals  OT Goals(current goals can now be found in the care plan section)  Progress towards OT goals: Progressing toward goals  Acute Rehab OT Goals Patient Stated Goal: to go rehab on 4th floor OT Goal Formulation: With patient Time For Goal Achievement: 07/01/24 Potential to Achieve Goals: Good ADL Goals Pt Will Perform Lower Body Bathing: sit to/from stand;sitting/lateral leans;with set-up Pt Will Perform Lower Body Dressing: with supervision;sit to/from stand Pt Will Transfer to Toilet: with supervision;ambulating Pt Will Perform Toileting - Clothing Manipulation and hygiene: with supervision;sit to/from stand  Plan      Co-evaluation                 AM-PAC OT 6 Clicks Daily Activity      Outcome Measure   Help from another person eating meals?: A Little Help from another person taking care of personal grooming?: A Little Help from another person toileting, which includes using toliet, bedpan, or urinal?: A Lot Help from another person bathing (including washing, rinsing, drying)?: A Lot Help from another person to put on and taking off regular upper body clothing?: A Little Help from another person to put on and taking off regular lower body clothing?: A Lot 6 Click Score: 15    End of Session Equipment Utilized During Treatment: Gait belt;Rolling walker (2 wheels)  OT Visit Diagnosis: Unsteadiness on feet (R26.81);Other abnormalities of gait and mobility (R26.89);Muscle weakness (generalized) (M62.81)   Activity Tolerance Patient tolerated treatment well   Patient Left in chair;with call bell/phone within reach;with chair alarm set   Nurse Communication Mobility status        Time: 9153-9068 OT Time Calculation (min): 45 min  Charges: OT General Charges $OT Visit: 1 Visit OT Treatments $Self Care/Home Management : 38-52 mins  Dick Golden, OTA Acute Rehabilitation Services  Office [phone]   Betty Golden 06/22/2024, 9:49 AM

## 2024-06-22 NOTE — Progress Notes (Signed)
 Patient discharged, Important Message Letter mailed to patient.

## 2024-06-22 NOTE — Progress Notes (Signed)
 Physical Therapy Treatment Patient Details Name: Betty Golden MRN: [id number] DOB: 1946/03/14 Today's Date: 06/22/2024   History of Present Illness Pt is a 78 y.o. female who presented 06/16/24 after getting dizzy and falling. 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 Today's session focused on transfer and gait training with RW. Pt remains limited by generalized weakness, decreased activity tolerance, poor balance strategies/postural reactions and impaired cognition. Continue to recommend post-acute rehab to maximize functional mobility and independence prior to return home.     If plan is discharge home, recommend the following: A lot of help with bathing/dressing/bathroom;Assistance with cooking/housework;Direct supervision/assist for medications management;Direct supervision/assist for financial management;Assist for transportation;Help with stairs or ramp for entrance;Supervision due to cognitive status;A little help with walking and/or transfers   Can travel by private vehicle      Yes  Equipment Recommendations  None recommended by PT    Recommendations for Other Services       Precautions / Restrictions Precautions Precautions: Fall Recall of Precautions/Restrictions: Impaired Precaution/Restrictions Comments: aphasia at baseline, watch HR Restrictions Weight Bearing Restrictions Per Provider Order: No     Mobility  Bed Mobility               General bed mobility comments: received sitting in recliner    Transfers Overall transfer level: Needs assistance Equipment used: Rolling walker (2 wheels) Transfers: Sit to/from Stand Sit to Stand:  Min assist, Contact guard assist           General transfer comment: multiple sit<>stands from recliner (12x) and low toilet height w/ grab bar (1x) to RW, cues for hand placement, CGA-minA for trunk elevation and stability    Ambulation/Gait Ambulation/Gait assistance: Min assist Gait Distance (Feet): 60 Feet (+ 20') Assistive device: Rolling walker (2 wheels) Gait Pattern/deviations: Step-to pattern, Step-through pattern, Decreased stride length, Knee flexed in stance - right, Knee flexed in stance - left, Trunk flexed, Drifts right/left Gait velocity: Decreased     General Gait Details: slow, unsteady gait with RW and intermittent minA for stability; bilateral knees and trunk flexed requiring cues for sequencing, upward gaze, upright posture, increased foot clearance; improved fluidity of gait with distance, but then becoming fatigued with increased forward flexion; decreased awareness of activity pacing   Stairs             Wheelchair Mobility     Tilt Bed    Modified Rankin (Stroke Patients Only) Modified Rankin (Stroke Patients Only) Pre-Morbid Rankin Score: Moderate disability Modified Rankin: Moderately severe disability     Balance Overall balance assessment: Needs assistance Sitting-balance support: No upper extremity supported, Feet supported Sitting balance-Leahy Scale: Good Sitting balance - Comments: performing seated pericare/toileting with standby assist   Standing balance support: Single extremity supported, Bilateral upper extremity supported, During functional activity Standing balance-Leahy Scale: Poor Standing balance comment: can stand without UE support to pull up underwear from thighs with minA for stability; leaning trunk against sink to wash hands                            Communication Communication Communication: Impaired Factors Affecting Communication: Difficulty expressing self  Cognition Arousal: Alert Behavior  During  Therapy: WFL for tasks assessed/performed   PT - Cognitive impairments: No family/caregiver present to determine baseline, Awareness, Memory, Attention, Sequencing, Problem solving, Safety/Judgement Difficult to assess due to: Impaired communication                     PT - Cognition Comments: h/o aphasia; apparent receptive and expressive difficulties, exacarbated by some HOH? following majority of simple commands with increased cues Following commands: Impaired Following commands impaired: Follows one step commands inconsistently, Follows one step commands with increased time    Cueing Cueing Techniques: Verbal cues  Exercises Other Exercises Other Exercises: pt performing 6x sit<>stand in 30-sec, reliant on UE support to push to standing; performing 10x total    General Comments General comments (skin integrity, edema, etc.): HR 98-130 with activity      Pertinent Vitals/Pain Pain Assessment Pain Assessment: Faces Faces Pain Scale: Hurts a little bit Pain Location: BLEs (bruise R distal anterior shin, L ankle area) - pt attributes to fall Pain Descriptors / Indicators: Aching, Sore Pain Intervention(s): Monitored during session    Home Living                          Prior Function            PT Goals (current goals can now be found in the care plan section) Progress towards PT goals: Progressing toward goals    Frequency    Min 2X/week      PT Plan      Co-evaluation              AM-PAC PT 6 Clicks Mobility   Outcome Measure  Help needed turning from your back to your side while in a flat bed without using bedrails?: A Little Help needed moving from lying on your back to sitting on the side of a flat bed without using bedrails?: A Little Help needed moving to and from a bed to a chair (including a wheelchair)?: A Little Help needed standing up from a chair using your arms (e.g., wheelchair or bedside chair)?: A Little Help needed  to walk in hospital room?: A Little Help needed climbing 3-5 steps with a railing? : A Lot 6 Click Score: 17    End of Session Equipment Utilized During Treatment: Gait belt Activity Tolerance: Patient tolerated treatment well Patient left: in chair;with call bell/phone within reach;with chair alarm set Nurse Communication: Mobility status PT Visit Diagnosis: Difficulty in walking, not elsewhere classified (R26.2);Unsteadiness on feet (R26.81);Other abnormalities of gait and mobility (R26.89);Muscle weakness (generalized) (M62.81);Other symptoms and signs involving the nervous system (R29.898)     Time: 8893-8870 PT Time Calculation (min) (ACUTE ONLY): 23 min  Charges:    $Gait Training: 8-22 mins $Therapeutic Activity: 8-22 mins PT General Charges $$ ACUTE PT VISIT: 1 Visit                      Darice Almas, PT, DPT Acute Rehabilitation Services  Personal: Secure Chat Rehab Office: [phone]  Darice LITTIE Almas 06/22/2024, 3:11 PM

## 2024-06-22 NOTE — Discharge Summary (Signed)
 Physician Discharge Summary   Patient: Betty Golden MRN: [id number] DOB: 1946/04/25  Admit date:     06/16/2024  Discharge date: 06/22/24  Discharge Physician: Sabas GORMAN Brod   PCP: Irven Ozell DEL, MD   Recommendations at discharge:   Patient to be discharged inpatient rehab Continue Keppra  500 g p.o. 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 *  Hospital Course: 78 y.o. female with medical history significant of hypertension, hyperlipidemia, CVA, residual expressive aphasia, GERD, depression, rheumatoid arthritis, asthma, COPD, anemia, spinal stenosis, fibromyalgia presenting after a fall at home  Patient reportedly got dizzy and had a fall last night at home. She did hit the back of her neck and her back. Noted to have some abdominal pain and leg pain afterwards. Has history of prior CVA and incidental repeat CVA noted last month. And has slurred speech/expressive aphasia at baseline.  CT head showed no acute abnormality.  CT C-spine showed no acute abnormality.   CT chest abdomen pelvis showed no acute abnormality but showed chronic emphysema, hepatic steatosis, diverticulosis without diverticulitis.   MRI brain showed acute infarct and chronic changes from prior infarct.   While in triage, RN drew blood and family was present at the beside when patient suddenly stopped responding to them and was  staring off not responding    Neurology was consulted  Assessment and Plan:  Status post fall/acute CVA/questionable seizure - Presented with fall at home; found to have stroke on MRI brain - Had seizure-like episode in the ED - Neurology consulted, started on Keppra  -Neurology recommends to continue Keppra  500 mg p.o. twice daily for 6 weeks and then taper off as outpatient.  Patient will discuss with neurology as outpatient regarding tapering of Keppra .   - Stroke workup started; EEG negative for epileptiform discharges -Will discuss with neurology regarding continuing Keppra  or stopping it - Continue rosuvastatin  - Follow neurology recommendations - Neurology recommends aspirin  and Plavix  for 3 months and then Plavix  alone - Plan to go to CIR - Had loop recorder placed on 10/29, interrogation was negative for A-fib     Lactic acidosis/leukocytosis -Presumed reactive - Lactic acid is down from 4.3-1.7 - WBC is down from 18,000-13.5 - UA is abnormal, urine culture was obtained; grew multiple species - Started on ceftriaxone ; will discontinue ceftriaxone      Hypertension - Blood pressure is mildly elevated -Will start Toprol  XL 25 mg daily   Hyperlipidemia - Continue statin   Depression - Continue duloxetine , trazodone , clonazepam    Rheumatoid arthritis Continue Areva   Spinal stenosis/fibromyalgia - Continue Lyrica    Anemia - Hemoglobin is stable           Consultants: Neurology Procedures performed:  Disposition: Rehabilitation facility Diet recommendation:  Discharge Diet Orders (From admission, onward)     Start     Ordered   06/22/24 0000  Diet - low sodium heart healthy  06/22/24 1200           Regular diet DISCHARGE MEDICATION: Allergies as of 06/22/2024       Reactions   Hydromorphone  Anaphylaxis   Sulfa Antibiotics Rash        Medication List     STOP taking these medications    amLODipine  10 MG tablet Commonly known  as: NORVASC    losartan -hydrochlorothiazide  100-12.5 MG tablet Commonly known as: HYZAAR   meclizine  12.5 MG tablet Commonly known as: ANTIVERT    potassium chloride  10 MEQ tablet Commonly known as: KLOR-CON  M   pregabalin  25 MG capsule Commonly known as: LYRICA        TAKE these medications    acetaminophen  325 MG tablet Commonly known as: TYLENOL  Take 1-2 tablets (325-650 mg total) by mouth every 4 (four) hours as needed for mild pain (pain score 1-3). What changed:  how much to take when to take this reasons to take this   albuterol  108 (90 Base) MCG/ACT inhaler Commonly known as: VENTOLIN  HFA INHALE 2 PUFFS INTO THE LUNGS EVERY 4 HOURS AS NEEDED FOR WHEEZING OR SHORTNESS OF BREATH   aspirin  EC 81 MG tablet Take 1 tablet (81 mg total) by mouth daily. Swallow whole.   CIMZIA Beckham Inject 1 Dose into the skin every 14 (fourteen) days.   clindamycin 1 % lotion Commonly known as: CLEOCIN T Apply 1 Application topically 2 (two) times daily.   clonazePAM  0.25 MG disintegrating tablet Commonly known as: KLONOPIN  Take 0.25 mg by mouth daily.   clopidogrel  75 MG tablet Commonly known as: PLAVIX  Take 1 tablet (75 mg total) by mouth daily.   colestipol  1 g tablet Commonly known as: COLESTID  Take 1 g by mouth daily.   DULoxetine  30 MG capsule Commonly known as: CYMBALTA  Take 1 capsule (30 mg total) by mouth 2 (two) times daily.   Fasenra  30 MG/ML prefilled syringe Generic drug: benralizumab  Inject 1 mL (30 mg total) into the skin every 8 (eight) weeks.   ferrous sulfate  325 (65 FE) MG EC tablet Take 325 mg by mouth daily.   leflunomide  20 MG tablet Commonly known as: ARAVA  Take 20 mg by mouth daily.   levETIRAcetam  500 MG tablet Commonly known as: KEPPRA  Take 1 tablet (500 mg total) by mouth 2 (two) times daily.   melatonin 5 MG Tabs Take 1 tablet (5 mg total) by mouth at bedtime.   metoprolol  succinate 25 MG 24 hr tablet Commonly known as: TOPROL -XL Take  1 tablet (25 mg total) by mouth daily. What changed:  medication strength how much to take   oxyCODONE  5 MG immediate release tablet Commonly known as: Oxy IR/ROXICODONE  Take 1 tablet (5 mg total) by mouth every 6 (six) hours as needed for severe pain (pain score 7-10).   pantoprazole  40 MG tablet Commonly known as: PROTONIX  Take 1 tablet (40 mg total) by mouth daily.   rosuvastatin  20 MG tablet Commonly known as: CRESTOR  Take 1 tablet (20 mg total) by mouth daily.   senna-docusate 8.6-50 MG tablet Commonly known as: Senokot-S Take 1 tablet by mouth at bedtime as needed for mild constipation or moderate constipation.   traZODone  50 MG tablet Commonly known as: DESYREL  Take 0.5 tablets (25 mg total) by mouth at bedtime.   Trelegy Ellipta  200-62.5-25 MCG/ACT Aepb Generic drug: Fluticasone -Umeclidin-Vilant Inhale 1 puff into the lungs in the morning.        Discharge Exam: Filed Weights   06/16/24 1137  Weight: 59 kg   General-appears in  no acute distress Heart-S1-S2, regular, no murmur auscultated Lungs-clear to auscultation bilaterally, no wheezing or crackles auscultated Abdomen-soft, nontender, no organomegaly Extremities-no edema in the lower extremities Neuro-alert, oriented x3, no focal deficit noted  Condition at discharge: good  The results of significant diagnostics from this hospitalization (including imaging, microbiology, ancillary and laboratory) are listed below for reference.   Imaging Studies: EEG adult Result Date: 06/17/2024 Shelton Arlin KIDD, MD     06/17/2024  3:43 AM Patient Name: REINA WILTON MRN: [id number] Epilepsy Attending: Arlin KIDD Shelton Referring Physician/Provider: Waddell Karna LABOR, NP Date: 06/17/2024 Duration: 21.35 mins Patient history: 46uo F with episode of staring. EEG to evaluate for seizure Level of alertness: Awake AEDs during EEG study: LEV Technical aspects: This EEG study was done with scalp electrodes positioned  according to the 10-20 International system of electrode placement. Electrical activity was reviewed with band pass filter of 1-70Hz , sensitivity of 7 uV/mm, display speed of 36mm/sec with a 60Hz  notched filter applied as appropriate. EEG data were recorded continuously and digitally stored.  Video monitoring was available and reviewed as appropriate. Description: The posterior dominant rhythm consists of 8 Hz activity of moderate voltage (25-35 uV) seen predominantly in posterior head regions, symmetric and reactive to eye opening and eye closing. Physiologic photic driving was not seen during photic stimulation.  Hyperventilation was  not performed.   IMPRESSION: This study is within normal limits. No seizures or epileptiform discharges were seen throughout the recording. A normal interictal EEG does not exclude the diagnosis of epilepsy. Arlin KIDD Shelton   MR BRAIN WO CONTRAST Result Date: 06/16/2024 EXAM: MRI BRAIN WITHOUT CONTRAST 06/16/2024 02:43:21 PM TECHNIQUE: Multiplanar multisequence MRI of the head/brain was performed without the administration of intravenous contrast. COMPARISON: Head CT 06/16/2024 and MRI 05/23/2024. CLINICAL HISTORY: Stroke, follow up. Recent fall. Aphasia. History of stroke. FINDINGS: BRAIN AND VENTRICLES: A punctate focus of restricted diffusion involving cortex in the right precentral gyrus is new from the prior MRI and consistent with an acute infarct. The cortical and subcortical infarction in the left frontoparietal region has further evolved from the prior MRI with progressive encephalomalacia, and a smaller late subacute to chronic infarct is again noted in the right periatrial white matter. Restricted diffusion associated with the subcentimeter acute left cerebellar infarct on the prior MRI has resolved. T2 hyperintensities elsewhere in the cerebral white matter bilaterally and in the pons are unchanged and nonspecific but compatible with mild chronic small vessel  ischemic disease. Chronic lacunar infarcts are noted in the basal ganglia bilaterally. No intracranial hemorrhage, mass, midline shift, hydrocephalus, or extra axial fluid collection is identified. There is mild cerebral atrophy. Major intracranial vascular flow voids are preserved. ORBITS: Bilateral cataract extraction. SINUSES AND MASTOIDS: Small right mastoid effusion. Clear paranasal sinuses. BONES AND SOFT TISSUES: Normal marrow signal. No acute soft tissue abnormality. IMPRESSION: 1. Punctate acute infarct in the right precentral gyrus. 2. Chronic ischemia with multiple old infarcts as above, including evolving late subacute to chronic bilateral MCA infarcts. Electronically signed by: Dasie Hamburg MD 06/16/2024 03:21 PM EST RP Workstation: [id number]   DG Ankle 2 Views Left Result Date: 06/16/2024 EXAM: 2 VIEW(S) XRAY OF THE LEFT ANKLE 06/16/2024 01:51:00 PM CLINICAL HISTORY: trauma COMPARISON: None available. FINDINGS: BONES AND JOINTS: No acute fracture. No focal osseous lesion. No joint dislocation. SOFT TISSUES: The soft tissues are unremarkable. IMPRESSION: 1. No acute osseous abnormality. Electronically signed by: Lynwood Seip MD 06/16/2024 02:16 PM EST RP Workstation: [id number]   DG Chest  Port 1 View Result Date: 06/16/2024 EXAM: 1 VIEW(S) XRAY OF THE CHEST 06/16/2024 01:51:00 PM COMPARISON: 05/22/2024 CLINICAL HISTORY: Trauma FINDINGS: LUNGS AND PLEURA: Stable left basilar scarring. No pleural effusion. No pneumothorax. HEART AND MEDIASTINUM: No acute abnormality of the cardiac and mediastinal silhouettes. BONES AND SOFT TISSUES: No acute osseous abnormality. IMPRESSION: 1. No acute cardiopulmonary process. Electronically signed by: Lynwood Seip MD 06/16/2024 02:14 PM EST RP Workstation: [id number]   CT CHEST ABDOMEN PELVIS W CONTRAST Result Date: 06/16/2024 CLINICAL DATA:  Syncope with fall hitting back of head and neck. Abdominal pain. EXAM: CT CHEST, ABDOMEN, AND PELVIS WITH CONTRAST  TECHNIQUE: Multidetector CT imaging of the chest, abdomen and pelvis was performed following the standard protocol during bolus administration of intravenous contrast. RADIATION DOSE REDUCTION: This exam was performed according to the departmental dose-optimization program which includes automated exposure control, adjustment of the mA and/or kV according to patient size and/or use of iterative reconstruction technique. CONTRAST:  75mL OMNIPAQUE  IOHEXOL  350 MG/ML SOLN COMPARISON:  CTA chest 05/22/2024 and CT abdomen/pelvis 05/27/2024, CT chest/abdomen/pelvis 09/25/2021 FINDINGS: CT CHEST FINDINGS Cardiovascular: Heart is normal size. Calcification of the mitral valve annulus. Thoracic aorta is normal in caliber without evidence of aneurysm or dissection. Pulmonary arterial system is unremarkable. Remaining vascular structures are normal. Mediastinum/Nodes: No mediastinal or hilar adenopathy. Mediastinal structures are otherwise unremarkable. Lungs/Pleura: Lungs are adequately inflated and demonstrate mild centrilobular emphysematous disease. Stable mild chronic peripheral reticular density over the upper lobes. No acute airspace process or effusion. Airways are normal. 3 mm subpleural nodule over the lateral left lower lobe unchanged from 2023 and therefore considered benign. Musculoskeletal: No acute fracture. CT ABDOMEN PELVIS FINDINGS Hepatobiliary: Prior cholecystectomy. Mild steatosis of the liver without focal mass. Biliary tree is normal. Pancreas: Normal. Spleen: Normal. Adrenals/Urinary Tract: Adrenal glands are normal. Kidneys are normal in size without hydronephrosis or nephrolithiasis. Right renal cyst unchanged. Bladder and ureters are normal. Stomach/Bowel: Stomach and small bowel are normal. Prior appendectomy. Moderate diverticulosis over the sigmoid colon and distal descending colon. No active inflammation. Mild diverticulosis over the right colon. Vascular/Lymphatic: Minimal calcified plaque over  the abdominal aorta which is normal in caliber. Remaining vascular structures are unremarkable. No adenopathy. Reproductive: Uterus and bilateral adnexa are unremarkable. Other: No free fluid or focal inflammatory change. Musculoskeletal: No acute fracture. IMPRESSION: 1. No acute findings in the chest, abdomen or pelvis. 2. Mild centrilobular emphysematous disease. 3. Mild hepatic steatosis. 4. Colonic diverticulosis without active inflammation. 5. Aortic atherosclerosis. Aortic Atherosclerosis (ICD10-I70.0) and Emphysema (ICD10-J43.9). Electronically Signed   By: Toribio Agreste M.D.   On: 06/16/2024 13:51   CT Cervical Spine Wo Contrast Result Date: 06/16/2024 EXAM: CT CERVICAL SPINE WITH CONTRAST 06/16/2024 01:19:54 PM TECHNIQUE: CT of the cervical spine was performed with the administration of 75 mL of iohexol  (OMNIPAQUE ) 350 MG/ML injection. Multiplanar reformatted images are provided for review. Automated exposure control, iterative reconstruction, and/or weight based adjustment of the mA/kV was utilized to reduce the radiation dose to as low as reasonably achievable. COMPARISON: Cervical spine 09/25/2021. CLINICAL HISTORY: Trauma. Mechanical fall secondary to dizziness. FINDINGS: CERVICAL SPINE: BONES AND ALIGNMENT: No acute fracture or traumatic malalignment. 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 Anterior osteophytes at C5-C6 and C6-C7 are stable. Uncovertebral spurring contributes to moderate left foraminal stenosis at C3-C4, C4-C5, and C5-C6. SOFT TISSUES: No prevertebral soft tissue swelling. Atherosclerotic calcifications are present in the carotid bifurcations bilaterally without significant stenosis. IMPRESSION: 1. No acute cervical spine injury  related to the reported trauma. 2. 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 3. Moderate left neural foraminal stenosis at C3-4, C4-5, and C5-6. Electronically signed by:  Lonni Necessary MD 06/16/2024 01:33 PM EST RP Workstation: [id number]   CT HEAD CODE STROKE WO CONTRAST (LKW 0-4.5h, LVO 0-24h) Result Date: 06/16/2024 EXAM: CT HEAD WITHOUT 06/16/2024 12:52:32 PM TECHNIQUE: CT of the head was performed without the administration of intravenous contrast. Automated exposure control, iterative reconstruction, and/or weight based adjustment of the mA/kV was utilized to reduce the radiation dose to as low as reasonably achievable. COMPARISON: 05/27/2024 CLINICAL HISTORY: Neuro deficit, acute, stroke suspected FINDINGS: BRAIN AND VENTRICLES: No acute intracranial hemorrhage. No mass effect or midline shift. No extra-axial fluid collection. No evidence of acute infarct. Chronic left parietal lobe infarct. Scattered hypoattenuating foci in supratentorial white matter, nonspecific but most commonly related to chronic microangiopathic changes. No hydrocephalus. ORBITS: No acute abnormality. SINUSES AND MASTOIDS: No acute abnormality. SOFT TISSUES AND SKULL: No acute skull fracture. No acute soft tissue abnormality. LIMITATIONS/ARTIFACTS: Motion artifact limiting evaluation. IMPRESSION: 1. No acute intracranial abnormality. 2. Chronic left parietal lobe infarct. 3. Motion artifact limiting evaluation. 4. These results were communicated to Dr. Matthews at 1:04 PM on 06/16/2024 by secure text page via the [facility] messaging system. Electronically signed by: Lonni Necessary MD 06/16/2024 01:05 PM EST RP Workstation: [id number]   CT ABDOMEN PELVIS WO CONTRAST Result Date: 05/27/2024 EXAM: CT ABDOMEN AND PELVIS WITHOUT CONTRAST 05/27/2024 08:59:50 PM TECHNIQUE: CT of the abdomen and pelvis was performed without the administration of intravenous contrast. Multiplanar reformatted images are provided for review. Automated exposure control, iterative reconstruction, and/or weight-based adjustment of the mA/kV was utilized to reduce the radiation dose to as low as reasonably achievable.  COMPARISON: CT abdomen and pelvis 05/22/2024. CLINICAL HISTORY: Abdominal/flank pain, stone suspected. FINDINGS: LOWER CHEST: No acute abnormality. LIVER: The liver is unremarkable. GALLBLADDER AND BILE DUCTS: Gallbladder is surgically absent. No biliary ductal dilatation. SPLEEN: No acute abnormality. PANCREAS: No acute abnormality. ADRENAL GLANDS: No acute abnormality. KIDNEYS, URETERS AND BLADDER: There is a 2 cm cyst in the right kidney. No stones in the kidneys or ureters. No hydronephrosis. No perinephric or periureteral stranding. Urinary bladder is unremarkable. Per consensus, no follow-up is needed for simple Bosniak type 1 and 2 renal cysts, unless the patient has a malignancy history or risk factors. GI AND BOWEL: Stomach demonstrates no acute abnormality. There is diffuse colonic diverticulosis, severe in the sigmoid colon. Appendix is not visualized. There is no bowel obstruction. PERITONEUM AND RETROPERITONEUM: No ascites. No free air. VASCULATURE: Aorta is normal in caliber. There are atherosclerotic calcifications of the aorta and iliac arteries. LYMPH NODES: No lymphadenopathy. REPRODUCTIVE ORGANS: No acute abnormality. BONES AND SOFT TISSUES: Degenerative changes affect the spine and hips. There is some subcutaneous stranding in the anterior right mid abdominal wall, possibly medication injection site. No acute osseous abnormality. IMPRESSION: 1. No acute findings in the abdomen or pelvis. 2. Right renal simple cyst ); no follow-up imaging recommended. 3. Surgically absent gallbladder. 4. Diffuse colonic diverticulosis. 5. Atherosclerotic calcifications of the abdominal aorta and iliac arteries. 6. Subcutaneous stranding in the anterior right mid abdominal wall, possibly medication injection site. Electronically signed by: Greig Pique MD 05/27/2024 09:40 PM EDT RP Workstation: [id number]   CT Head Wo Contrast Result Date: 05/27/2024 EXAM: CT HEAD WITHOUT CONTRAST 05/27/2024 08:48:57 PM  TECHNIQUE: CT of the head was performed without the administration of intravenous contrast. Automated exposure control, iterative  reconstruction, and/or weight based adjustment of the mA/kV was utilized to reduce the radiation dose to as low as reasonably achievable. COMPARISON: CT head 05/23/2024, MRI head 05/23/2024. CLINICAL HISTORY: worsening headache. Eval for subdural/epidural in setting of possible fall FINDINGS: BRAIN AND VENTRICLES: No acute hemorrhage. No evidence of acute infarct. No hydrocephalus. No extra-axial collection. No mass effect or midline shift. Patchy and confluent areas of decreased attenuation are noted throughout the deep and periventricular white matter of the cerebral hemispheres bilaterally, suggestive of chronic microvascular ischemic changes. Left frontoparietal encephalomalacia. ORBITS: No acute abnormality. SINUSES: No acute abnormality. SOFT TISSUES AND SKULL: No acute soft tissue abnormality. No skull fracture. IMPRESSION: 1. No acute intracranial abnormality. Electronically signed by: Morgane Naveau MD 05/27/2024 08:58 PM EDT RP Workstation: [id number]   EP PPM/ICD IMPLANT Result Date: 05/27/2024 CONCLUSIONS:  1. 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 No early apparent complications. Ozell Prentice Passey, PA-C Cardiac Electrophysiology   ECHO TEE Result Date: 05/25/2024    TRANSESOPHOGEAL ECHO REPORT   Patient Name:   NYSA SARIN Date of Exam: 05/25/2024 Medical Rec #:  [id number]         Height:       63.0 in Accession #:    [id number]        Weight:       152.0 lb Date of Birth:  25-Nov-1945         BSA:          1.721 m Patient Age:    78 years          BP:           144/79 mmHg Patient Gender: F                 HR:           87 bpm. Exam Location:  Inpatient Procedure: Transesophageal Echo, Cardiac Doppler, Limited Color Doppler, Saline            Contrast Bubble Study and 3D Echo (Both Spectral and Color  Flow            Doppler were utilized during procedure). Indications:     Endocarditis  History:         Patient has prior history of Echocardiogram examinations, most                  recent 05/07/2024. COPD and hx of CVA, Severe Mitral Annular                  Calcification; Risk Factors:Hypertension, Dyslipidemia and                  Former Smoker.  Sonographer:     Koleen Popper RDCS Referring Phys:  [id number] ROLLO JONELLE LOUDER Diagnosing Phys: Shelda Bruckner MD  Sonographer Comments: Image acquisition challenging due to respiratory motion. PROCEDURE: After discussion of the risks and benefits of a TEE, an informed consent was obtained from the patient. The transesophogeal probe was passed without difficulty through the esophogus of the patient. Imaged were obtained with the patient in a left lateral decubitus position. Sedation performed by different physician. The patient was monitored while under deep sedation. Anesthestetic sedation was provided intravenously by Anesthesiology: 240mg  of Propofol , 80mg  of Lidocaine . Image quality was adequate. The patient's vital signs; including heart rate, blood pressure, and oxygen saturation; remained stable throughout the procedure. The patient developed no complications during the procedure.  IMPRESSIONS  1. Left ventricular ejection fraction, by estimation, is 65 to 70%. The left ventricle has normal function.  2. Right ventricular systolic function is normal. The right ventricular size is normal.  3. No left atrial/left atrial appendage thrombus was detected. The LAA emptying velocity was 49 cm/s.  4. There is a small mobile structure on A2 that extends into the atrium, suspect ruptured chordae or degenerative portion of valve. No bacteremia, but in different clinical scenario would be concerning for endocarditis. There is a large focal nodule of calcification on the posterior leaflet, near P2 and the mitral annulus. Below the leaflets, there are calcified and  thickened chordae with some mobility on the ventricular aspect of the valve. The mitral valve is degenerative. Mild mitral valve regurgitation. No evidence of mitral stenosis. Severe mitral annular calcification.  5. The aortic valve is tricuspid. There is mild calcification of the aortic valve. There is mild thickening of the aortic valve. Aortic valve regurgitation is not visualized. Aortic valve sclerosis/calcification is present, without any evidence of aortic stenosis.  6. There is Moderate (Grade III) plaque involving the descending aorta.  7. Agitated saline contrast bubble study was negative, with no evidence of any interatrial shunt.  8. 3D performed of the mitral valve and demonstrates Focal calcified nodule on P2/annulus, without mobility. Small mobile structure on A2. Small focal calcified/mobile structures in subvalvular apparatus. 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 No clear cardiac source of embolism. FINDINGS  Left Ventricle: Left ventricular ejection fraction, by estimation, is 65 to 70%. The left ventricle has normal function. The left ventricular internal cavity size was normal in size. Right Ventricle: The right ventricular size is normal. No increase in right ventricular wall thickness. Right ventricular systolic function is normal. Left Atrium: Left atrial size was normal in size. No left atrial/left atrial appendage thrombus was detected. The LAA emptying velocity was 49 cm/s. Right Atrium: Right atrial size was normal in size. Prominent Eustachian valve. Pericardium: Trivial pericardial effusion is present. Mitral Valve: There is a small mobile structure on A2 that extends into the atrium, suspect ruptured chordae or degenerative portion of valve. No bacteremia, but in different clinical scenario would be concerning for endocarditis. There is a large focal nodule of calcification on the posterior leaflet, near P2 and the mitral  annulus. Below the leaflets, there are calcified and thickened chordae with some mobility on the ventricular aspect of the valve. The mitral valve is degenerative in appearance. There is moderate thickening of the mitral valve leaflet(s). Severe mitral annular calcification. Mild mitral valve regurgitation. No evidence of mitral valve stenosis. Tricuspid Valve: The tricuspid valve is normal in structure. Tricuspid valve regurgitation is trivial. No evidence of tricuspid stenosis. Aortic Valve: The aortic valve is tricuspid. There is mild calcification of the aortic valve. There is mild thickening of the aortic valve. There is mild to moderate aortic valve annular calcification. Aortic valve regurgitation is not visualized. Aortic  valve sclerosis/calcification is present, without any evidence of aortic stenosis. Pulmonic Valve: The pulmonic valve was grossly normal. Pulmonic valve regurgitation is trivial. No evidence of pulmonic stenosis. Aorta: The aortic root and ascending aorta are structurally normal, with no evidence of dilitation. There is moderate (Grade III) plaque involving the descending aorta. IAS/Shunts: No atrial level shunt detected by color flow Doppler. Agitated saline contrast was given intravenously to evaluate for intracardiac shunting. Agitated saline contrast bubble study was negative, with no evidence of any interatrial shunt. Additional Comments: Spectral Doppler performed. LEFT  VENTRICLE PLAX 2D LVOT diam:     1.80 cm LVOT Area:     2.54 cm   AORTA Ao Root diam: 2.40 cm  SHUNTS Systemic Diam: 1.80 cm Shelda Bruckner MD Electronically signed by Shelda Bruckner MD Signature Date/Time: 05/25/2024/2:55:21 PM    Final    EP STUDY Result Date: 05/25/2024 See surgical note for result.  VAS US  LOWER EXTREMITY VENOUS (DVT) Result Date: 05/24/2024  Lower Venous DVT Study Patient Name:  TERISSA HAFFEY  Date of Exam:   05/24/2024 Medical Rec #: [id number]          Accession #:     [id number] Date of Birth: 12-Apr-1946          Patient Gender: F Patient Age:   32 years Exam Location:  [facility] Procedure:      VAS US  LOWER EXTREMITY VENOUS (DVT) Referring Phys: ROCKY LIKES --------------------------------------------------------------------------------  Indications: Injury to blood vessels, unspecified site T14.90.  Risk Factors: None identified. Limitations: Poor ultrasound/tissue interface. Comparison Study: No prior studies. Performing Technologist: Cordella Collet RVT  Examination Guidelines: A complete evaluation includes B-mode imaging, spectral Doppler, color Doppler, and power Doppler as needed of all accessible portions of each vessel. Bilateral testing is considered an integral part of a complete examination. Limited examinations for reoccurring indications may be performed as noted. The reflux portion of the exam is performed with the patient in reverse Trendelenburg.  +---------+---------------+---------+-----------+----------+--------------+ RIGHT    CompressibilityPhasicitySpontaneityPropertiesThrombus Aging +---------+---------------+---------+-----------+----------+--------------+ CFV      Full           Yes      Yes                                 +---------+---------------+---------+-----------+----------+--------------+ SFJ      Full                                                        +---------+---------------+---------+-----------+----------+--------------+ FV Prox  Full                                                        +---------+---------------+---------+-----------+----------+--------------+ FV Mid   Full                                                        +---------+---------------+---------+-----------+----------+--------------+ FV DistalFull                                                        +---------+---------------+---------+-----------+----------+--------------+ PFV      Full                                                         +---------+---------------+---------+-----------+----------+--------------+  POP      Full           Yes      Yes                                 +---------+---------------+---------+-----------+----------+--------------+ PTV      Full                                                        +---------+---------------+---------+-----------+----------+--------------+ PERO     Full                                                        +---------+---------------+---------+-----------+----------+--------------+   +---------+---------------+---------+-----------+----------+--------------+ LEFT     CompressibilityPhasicitySpontaneityPropertiesThrombus Aging +---------+---------------+---------+-----------+----------+--------------+ CFV      Full           Yes      Yes                                 +---------+---------------+---------+-----------+----------+--------------+ SFJ      Full                                                        +---------+---------------+---------+-----------+----------+--------------+ FV Prox  Full                                                        +---------+---------------+---------+-----------+----------+--------------+ FV Mid   Full                                                        +---------+---------------+---------+-----------+----------+--------------+ FV DistalFull                                                        +---------+---------------+---------+-----------+----------+--------------+ PFV      Full                                                        +---------+---------------+---------+-----------+----------+--------------+ POP      Full           Yes      Yes                                 +---------+---------------+---------+-----------+----------+--------------+  PTV      Full                                                         +---------+---------------+---------+-----------+----------+--------------+ PERO     Full                                                        +---------+---------------+---------+-----------+----------+--------------+     Summary: RIGHT: - There is no evidence of deep vein thrombosis in the lower extremity.  - No cystic structure found in the popliteal fossa.  LEFT: - There is no evidence of deep vein thrombosis in the lower extremity.  - No cystic structure found in the popliteal fossa.  *See table(s) above for measurements and observations. Electronically signed by Debby Robertson on 05/24/2024 at 1:19:10 PM.    Final    CT ANGIO HEAD NECK W WO CM Result Date: 05/24/2024 EXAM: CTA Head and Neck with Intravenous Contrast. CT Head without Contrast. CLINICAL HISTORY: Stroke/TIA, determine embolic source. TECHNIQUE: Axial CTA images of the head and neck performed with intravenous contrast. MIP reconstructed images were created and reviewed. Axial computed tomography images of the head/brain performed without intravenous contrast. Note: Per PQRS, the description of internal carotid artery percent stenosis, including 0 percent or normal exam, is based on [employer] American Symptomatic Carotid Endarterectomy Trial (NASCET) criteria. Dose reduction technique was used including one or more of the following: automated exposure control, adjustment of mA and kV according to patient size, and/or iterative reconstruction. CONTRAST: Without and with; 75mL (iohexol  (OMNIPAQUE ) 350 MG/ML injection 75 mL IOHEXOL  350 MG/ML SOLN) COMPARISON: MRI head from earlier today. CTA head and neck 04/13/2024 FINDINGS: BRAIN: No acute intraparenchymal hemorrhage. No mass lesion. 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 No midline shift or extra-axial collection. VENTRICLES: No hydrocephalus. ORBITS: The orbits are unremarkable. SINUSES AND MASTOIDS: The paranasal sinuses and  mastoid air cells are clear. COMMON CAROTID ARTERIES: No significant stenosis. No dissection or occlusion. INTERNAL CAROTID ARTERIES: No stenosis by NASCET criteria. No dissection or occlusion. VERTEBRAL ARTERIES: No significant stenosis. No dissection or occlusion. ANTERIOR CEREBRAL ARTERIES: No significant stenosis. No occlusion. No aneurysm. MIDDLE CEREBRAL ARTERIES: Similar proximal left M2 MCA occlusion. No new large vessel occlusion. Right MCA is patent. POSTERIOR CEREBRAL ARTERIES: No significant stenosis. No occlusion. No aneurysm. BASILAR ARTERY: No significant stenosis. No occlusion. No aneurysm. LUNG APICES: Unchanged opacities in the peripheral right upper lobe, which may reflect scarring. BONES: No acute osseous abnormality. Bulky bridging osteophytes in the cervical spine. IMPRESSION: 1. No evidence of acute intracranial abnormality. 2. Similar proximal left M2 MCA occlusion. No new large vessel occlusion. Electronically signed by: Gilmore Molt MD 05/24/2024 01:11 AM EDT RP Workstation: [id number]    Microbiology: Results for orders placed or performed during the hospital encounter of 06/16/24  Urine Culture (for pregnant, neutropenic or urologic patients or patients with an indwelling urinary catheter)     Status: Abnormal   Collection Time: 06/17/24 11:09 AM   Specimen: Urine, Clean Catch  Result Value Ref Range Status   Specimen Description URINE, CLEAN CATCH  Final  Special Requests   Final    NONE Performed at [facility], [address], [place], [place] [address]    Culture MULTIPLE SPECIES PRESENT, SUGGEST RECOLLECTION (A)  Final   Report Status 06/18/2024 FINAL  Final    Labs: CBC: Recent Labs  Lab 06/16/24 1234 06/16/24 1244 06/17/24 0504  WBC 18.1*  --  13.5*  NEUTROABS 12.5*  --   --   HGB 12.8 13.6 11.7*  HCT 39.2 40.0 37.3  MCV 86.7  --  88.6  PLT 194  --  145*   Basic Metabolic Panel: Recent Labs  Lab 06/16/24 1234 06/16/24 1244  06/17/24 0504  NA 140 141 143  K 3.6 3.5 4.1  CL 104 109 109  CO2 17*  --  17*  GLUCOSE 126* 99 85  BUN 13 14 9   CREATININE 1.12* 0.90 0.84  CALCIUM  10.2  --  9.4   Liver Function Tests: Recent Labs  Lab 06/16/24 1234 06/17/24 0504  AST 28 21  ALT 17 15  ALKPHOS 61 51  BILITOT 0.6 1.0  PROT 7.6 6.0*  ALBUMIN 3.8 3.0*   CBG: Recent Labs  Lab 06/16/24 1244 06/20/24 2141  GLUCAP 128* 105*    Discharge time spent: greater than 30 minutes.  Signed: Sabas GORMAN Brod, MD [facility] 06/22/2024

## 2024-06-22 NOTE — Discharge Instructions (Addendum)
 Inpatient Rehab Discharge Instructions  [facility] Discharge date and time: No discharge date for patient encounter.   Activities/Precautions/ Functional Status: Activity: As tolerated Diet: Regular Wound Care: Routine skin checks Functional status:  ___ No restrictions     ___ Walk up steps independently ___ 24/7 supervision/assistance   ___ Walk up steps with assistance ___ Intermittent supervision/assistance  ___ Bathe/dress independently ___ Walk with walker     _x__ Bathe/dress with assistance ___ Walk Independently    ___ Shower independently ___ Walk with assistance    ___ Shower with assistance ___ No alcohol     ___ Return to work/school ________  Special Instructions: No driving smoking or alcohol   My questions have been answered and I understand these instructions. I will adhere to these goals and the provided educational materials after my discharge from the hospital.  Patient/Caregiver Signature _______________________________ Date __________  Clinician Signature _______________________________________ Date __________  Please bring this form and your medication list with you to all your follow-up doctor's appointments. STROKE/TIA DISCHARGE INSTRUCTIONS SMOKING Cigarette smoking nearly doubles your risk of having a stroke & is the single most alterable risk factor  If you smoke or have smoked in the last 12 months, you are advised to quit smoking for your health. Most of the excess cardiovascular risk related to smoking disappears within a year of stopping. Ask you doctor about anti-smoking medications [place] Quit Line: 1-800-QUIT NOW Free Smoking Cessation Classes [phone]  CHOLESTEROL Know your levels; limit fat & cholesterol in your diet  Lipid Panel     Component Value Date/Time   CHOL 90 05/24/2024 0436   TRIG 94 05/24/2024 0436   HDL 46 05/24/2024 0436   CHOLHDL 2.0 05/24/2024 0436   VLDL 19 05/24/2024 0436   LDLCALC 25 05/24/2024 0436     Many  patients benefit from treatment even if their cholesterol is at goal. Goal: Total Cholesterol (CHOL) less than 160 Goal:  Triglycerides (TRIG) less than 150 Goal:  HDL greater than 40 Goal:  LDL (LDLCALC) less than 100   BLOOD PRESSURE American Stroke Association blood pressure target is less that 120/80 mm/Hg  Your discharge blood pressure is:  BP: (!) 150/72 Monitor your blood pressure Limit your salt and alcohol intake Many individuals will require more than one medication for high blood pressure  DIABETES (A1c is a blood sugar average for last 3 months) Goal HGBA1c is under 7% (HBGA1c is blood sugar average for last 3 months)  Diabetes: No known diagnosis of diabetes    Lab Results  Component Value Date   HGBA1C 4.7 (L) 04/13/2024    Your HGBA1c can be lowered with medications, healthy diet, and exercise. Check your blood sugar as directed by your physician Call your physician if you experience unexplained or low blood sugars.  PHYSICAL ACTIVITY/REHABILITATION Goal is 30 minutes at least 4 days per week  Activity: Increase activity slowly, Therapies: Physical Therapy: Home Health Return to work:  Activity decreases your risk of heart attack and stroke and makes your heart stronger.  It helps control your weight and blood pressure; helps you relax and can improve your mood. Participate in a regular exercise program. Talk with your doctor about the best form of exercise for you (dancing, walking, swimming, cycling).  DIET/WEIGHT Goal is to maintain a healthy weight  Your discharge diet is:  Diet Order             Diet regular Fluid consistency: Thin  Diet effective now  liquids Your height is:  Height: 5' 4 (162.6 cm) Your current weight is: Weight: 57.9 kg Your Body Mass Index (BMI) is:  BMI (Calculated): 21.9 Following the type of diet specifically designed for you will help prevent another stroke. Your goal weight range is:   Your goal Body Mass  Index (BMI) is 19-24. Healthy food habits can help reduce 3 risk factors for stroke:  High cholesterol, hypertension, and excess weight.  RESOURCES Stroke/Support Group:  Call [phone]   STROKE EDUCATION PROVIDED/REVIEWED AND GIVEN TO PATIENT Stroke warning signs and symptoms How to activate emergency medical system (call 911). Medications prescribed at discharge. Need for follow-up after discharge. Personal risk factors for stroke. Pneumonia vaccine given: No Flu vaccine given: No My questions have been answered, the writing is legible, and I understand these instructions.  I will adhere to these goals & educational materials that have been provided to me after my discharge from the hospital.

## 2024-06-22 NOTE — Discharge Summary (Signed)
 Physician Discharge Summary  Patient ID: Betty Golden MRN: [id number] DOB/AGE: July 31, 1945 78 y.o.  Admit date: 06/22/2024 Discharge date: 07/02/2024  Discharge Diagnoses:  Principal Problem:   Cardioembolic stroke [facility]) DVT prophylaxis Mood stabilization Seizure prophylaxis COPD Hyperlipidemia Rheumatoid arthritis GERD Decreased nutritional storage Constipation Fibromyalgia  Discharged Condition: Stable  Significant Diagnostic Studies: EEG adult Result Date: 06/17/2024 Shelton Arlin KIDD, MD     06/17/2024  3:43 AM Patient Name: Betty Golden MRN: [id number] Epilepsy Attending: Arlin KIDD Shelton Referring Physician/Provider: Waddell Karna LABOR, NP Date: 06/17/2024 Duration: 21.35 mins Patient history: 46uo F with episode of staring. EEG to evaluate for seizure Level of alertness: Awake AEDs during EEG study: LEV Technical aspects: This EEG study was done with scalp electrodes positioned according to the 10-20 International system of electrode placement. Electrical activity was reviewed with band pass filter of 1-70Hz , sensitivity of 7 uV/mm, display speed of 72mm/sec with a 60Hz  notched filter applied as appropriate. EEG data were recorded continuously and digitally stored.  Video monitoring was available and reviewed as appropriate. Description: The posterior dominant rhythm consists of 8 Hz activity of moderate voltage (25-35 uV) seen predominantly in posterior head regions, symmetric and reactive to eye opening and eye closing. Physiologic photic driving was not seen during photic stimulation.  Hyperventilation was  not performed.   IMPRESSION: This study is within normal limits. No seizures or epileptiform discharges were seen throughout the recording. A normal interictal EEG does not exclude the diagnosis of epilepsy. Arlin KIDD Shelton   MR BRAIN WO CONTRAST Result Date: 06/16/2024 EXAM: MRI BRAIN WITHOUT CONTRAST 06/16/2024 02:43:21 PM TECHNIQUE: Multiplanar multisequence MRI of  the head/brain was performed without the administration of intravenous contrast. COMPARISON: Head CT 06/16/2024 and MRI 05/23/2024. CLINICAL HISTORY: Stroke, follow up. Recent fall. Aphasia. History of stroke. FINDINGS: BRAIN AND VENTRICLES: A punctate focus of restricted diffusion involving cortex in the right precentral gyrus is new from the prior MRI and consistent with an acute infarct. The cortical and subcortical infarction in the left frontoparietal region has further evolved from the prior MRI with progressive encephalomalacia, and a smaller late subacute to chronic infarct is again noted in the right periatrial white matter. Restricted diffusion associated with the subcentimeter acute left cerebellar infarct on the prior MRI has resolved. T2 hyperintensities elsewhere in the cerebral white matter bilaterally and in the pons are unchanged and nonspecific but compatible with mild chronic small vessel ischemic disease. Chronic lacunar infarcts are noted in the basal ganglia bilaterally. No intracranial hemorrhage, mass, midline shift, hydrocephalus, or extra axial fluid collection is identified. There is mild cerebral atrophy. Major intracranial vascular flow voids are preserved. ORBITS: Bilateral cataract extraction. SINUSES AND MASTOIDS: Small right mastoid effusion. Clear paranasal sinuses. BONES AND SOFT TISSUES: Normal marrow signal. No acute soft tissue abnormality. IMPRESSION: 1. Punctate acute infarct in the right precentral gyrus. 2. Chronic ischemia with multiple old infarcts as above, including evolving late subacute to chronic bilateral MCA infarcts. Electronically signed by: Dasie Hamburg MD 06/16/2024 03:21 PM EST RP Workstation: [id number]   DG Ankle 2 Views Left Result Date: 06/16/2024 EXAM: 2 VIEW(S) XRAY OF THE LEFT ANKLE 06/16/2024 01:51:00 PM CLINICAL HISTORY: trauma COMPARISON: None available. FINDINGS: BONES AND JOINTS: No acute fracture. No focal osseous lesion. No joint dislocation.  SOFT TISSUES: The soft tissues are unremarkable. IMPRESSION: 1. No acute osseous abnormality. Electronically signed by: Lynwood Seip MD 06/16/2024 02:16 PM EST RP Workstation: [id number]   DG Chest Port 1 View Result Date: 06/16/2024  EXAM: 1 VIEW(S) XRAY OF THE CHEST 06/16/2024 01:51:00 PM COMPARISON: 05/22/2024 CLINICAL HISTORY: Trauma FINDINGS: LUNGS AND PLEURA: Stable left basilar scarring. No pleural effusion. No pneumothorax. HEART AND MEDIASTINUM: No acute abnormality of the cardiac and mediastinal silhouettes. BONES AND SOFT TISSUES: No acute osseous abnormality. IMPRESSION: 1. No acute cardiopulmonary process. Electronically signed by: Lynwood Seip MD 06/16/2024 02:14 PM EST RP Workstation: [id number]   CT CHEST ABDOMEN PELVIS W CONTRAST Result Date: 06/16/2024 CLINICAL DATA:  Syncope with fall hitting back of head and neck. Abdominal pain. EXAM: CT CHEST, ABDOMEN, AND PELVIS WITH CONTRAST TECHNIQUE: Multidetector CT imaging of the chest, abdomen and pelvis was performed following the standard protocol during bolus administration of intravenous contrast. RADIATION DOSE REDUCTION: This exam was performed according to the departmental dose-optimization program which includes automated exposure control, adjustment of the mA and/or kV according to patient size and/or use of iterative reconstruction technique. CONTRAST:  75mL OMNIPAQUE  IOHEXOL  350 MG/ML SOLN COMPARISON:  CTA chest 05/22/2024 and CT abdomen/pelvis 05/27/2024, CT chest/abdomen/pelvis 09/25/2021 FINDINGS: CT CHEST FINDINGS Cardiovascular: Heart is normal size. Calcification of the mitral valve annulus. Thoracic aorta is normal in caliber without evidence of aneurysm or dissection. Pulmonary arterial system is unremarkable. Remaining vascular structures are normal. Mediastinum/Nodes: No mediastinal or hilar adenopathy. Mediastinal structures are otherwise unremarkable. Lungs/Pleura: Lungs are adequately inflated and demonstrate mild  centrilobular emphysematous disease. Stable mild chronic peripheral reticular density over the upper lobes. No acute airspace process or effusion. Airways are normal. 3 mm subpleural nodule over the lateral left lower lobe unchanged from 2023 and therefore considered benign. Musculoskeletal: No acute fracture. CT ABDOMEN PELVIS FINDINGS Hepatobiliary: Prior cholecystectomy. Mild steatosis of the liver without focal mass. Biliary tree is normal. Pancreas: Normal. Spleen: Normal. Adrenals/Urinary Tract: Adrenal glands are normal. Kidneys are normal in size without hydronephrosis or nephrolithiasis. Right renal cyst unchanged. Bladder and ureters are normal. Stomach/Bowel: Stomach and small bowel are normal. Prior appendectomy. Moderate diverticulosis over the sigmoid colon and distal descending colon. No active inflammation. Mild diverticulosis over the right colon. Vascular/Lymphatic: Minimal calcified plaque over the abdominal aorta which is normal in caliber. Remaining vascular structures are unremarkable. No adenopathy. Reproductive: Uterus and bilateral adnexa are unremarkable. Other: No free fluid or focal inflammatory change. Musculoskeletal: No acute fracture. IMPRESSION: 1. No acute findings in the chest, abdomen or pelvis. 2. Mild centrilobular emphysematous disease. 3. Mild hepatic steatosis. 4. Colonic diverticulosis without active inflammation. 5. Aortic atherosclerosis. Aortic Atherosclerosis (ICD10-I70.0) and Emphysema (ICD10-J43.9). Electronically Signed   By: Toribio Agreste M.D.   On: 06/16/2024 13:51   CT Cervical Spine Wo Contrast Result Date: 06/16/2024 EXAM: CT CERVICAL SPINE WITH CONTRAST 06/16/2024 01:19:54 PM TECHNIQUE: CT of the cervical spine was performed with the administration of 75 mL of iohexol  (OMNIPAQUE ) 350 MG/ML injection. Multiplanar reformatted images are provided for review. Automated exposure control, iterative reconstruction, and/or weight based adjustment of the mA/kV was  utilized to reduce the radiation dose to as low as reasonably achievable. COMPARISON: Cervical spine 09/25/2021. CLINICAL HISTORY: Trauma. Mechanical fall secondary to dizziness. FINDINGS: CERVICAL SPINE: BONES AND ALIGNMENT: No acute fracture or traumatic malalignment. 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 Anterior osteophytes at C5-C6 and C6-C7 are stable. Uncovertebral spurring contributes to moderate left foraminal stenosis at C3-C4, C4-C5, and C5-C6. SOFT TISSUES: No prevertebral soft tissue swelling. Atherosclerotic calcifications are present in the carotid bifurcations bilaterally without significant stenosis. IMPRESSION: 1. No acute cervical spine injury related to the reported trauma. 2.  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 3. Moderate left neural foraminal stenosis at C3-4, C4-5, and C5-6. Electronically signed by: Lonni Necessary MD 06/16/2024 01:33 PM EST RP Workstation: [id number]   CT HEAD CODE STROKE WO CONTRAST (LKW 0-4.5h, LVO 0-24h) Result Date: 06/16/2024 EXAM: CT HEAD WITHOUT 06/16/2024 12:52:32 PM TECHNIQUE: CT of the head was performed without the administration of intravenous contrast. Automated exposure control, iterative reconstruction, and/or weight based adjustment of the mA/kV was utilized to reduce the radiation dose to as low as reasonably achievable. COMPARISON: 05/27/2024 CLINICAL HISTORY: Neuro deficit, acute, stroke suspected FINDINGS: BRAIN AND VENTRICLES: No acute intracranial hemorrhage. No mass effect or midline shift. No extra-axial fluid collection. No evidence of acute infarct. Chronic left parietal lobe infarct. Scattered hypoattenuating foci in supratentorial white matter, nonspecific but most commonly related to chronic microangiopathic changes. No hydrocephalus. ORBITS: No acute abnormality. SINUSES AND MASTOIDS: No acute abnormality. SOFT TISSUES AND SKULL: No acute skull fracture.  No acute soft tissue abnormality. LIMITATIONS/ARTIFACTS: Motion artifact limiting evaluation. IMPRESSION: 1. No acute intracranial abnormality. 2. Chronic left parietal lobe infarct. 3. Motion artifact limiting evaluation. 4. These results were communicated to Dr. Matthews at 1:04 PM on 06/16/2024 by secure text page via the [facility] messaging system. Electronically signed by: Lonni Necessary MD 06/16/2024 01:05 PM EST RP Workstation: [id number]   CT ABDOMEN PELVIS WO CONTRAST Result Date: 05/27/2024 EXAM: CT ABDOMEN AND PELVIS WITHOUT CONTRAST 05/27/2024 08:59:50 PM TECHNIQUE: CT of the abdomen and pelvis was performed without the administration of intravenous contrast. Multiplanar reformatted images are provided for review. Automated exposure control, iterative reconstruction, and/or weight-based adjustment of the mA/kV was utilized to reduce the radiation dose to as low as reasonably achievable. COMPARISON: CT abdomen and pelvis 05/22/2024. CLINICAL HISTORY: Abdominal/flank pain, stone suspected. FINDINGS: LOWER CHEST: No acute abnormality. LIVER: The liver is unremarkable. GALLBLADDER AND BILE DUCTS: Gallbladder is surgically absent. No biliary ductal dilatation. SPLEEN: No acute abnormality. PANCREAS: No acute abnormality. ADRENAL GLANDS: No acute abnormality. KIDNEYS, URETERS AND BLADDER: There is a 2 cm cyst in the right kidney. No stones in the kidneys or ureters. No hydronephrosis. No perinephric or periureteral stranding. Urinary bladder is unremarkable. Per consensus, no follow-up is needed for simple Bosniak type 1 and 2 renal cysts, unless the patient has a malignancy history or risk factors. GI AND BOWEL: Stomach demonstrates no acute abnormality. There is diffuse colonic diverticulosis, severe in the sigmoid colon. Appendix is not visualized. There is no bowel obstruction. PERITONEUM AND RETROPERITONEUM: No ascites. No free air. VASCULATURE: Aorta is normal in caliber. There are atherosclerotic  calcifications of the aorta and iliac arteries. LYMPH NODES: No lymphadenopathy. REPRODUCTIVE ORGANS: No acute abnormality. BONES AND SOFT TISSUES: Degenerative changes affect the spine and hips. There is some subcutaneous stranding in the anterior right mid abdominal wall, possibly medication injection site. No acute osseous abnormality. IMPRESSION: 1. No acute findings in the abdomen or pelvis. 2. Right renal simple cyst ); no follow-up imaging recommended. 3. Surgically absent gallbladder. 4. Diffuse colonic diverticulosis. 5. Atherosclerotic calcifications of the abdominal aorta and iliac arteries. 6. Subcutaneous stranding in the anterior right mid abdominal wall, possibly medication injection site. Electronically signed by: Greig Pique MD 05/27/2024 09:40 PM EDT RP Workstation: [id number]   CT Head Wo Contrast Result Date: 05/27/2024 EXAM: CT HEAD WITHOUT CONTRAST 05/27/2024 08:48:57 PM TECHNIQUE: CT of the head was performed without the administration of intravenous contrast. Automated exposure control, iterative reconstruction, and/or weight based adjustment of the  mA/kV was utilized to reduce the radiation dose to as low as reasonably achievable. COMPARISON: CT head 05/23/2024, MRI head 05/23/2024. CLINICAL HISTORY: worsening headache. Eval for subdural/epidural in setting of possible fall FINDINGS: BRAIN AND VENTRICLES: No acute hemorrhage. No evidence of acute infarct. No hydrocephalus. No extra-axial collection. No mass effect or midline shift. Patchy and confluent areas of decreased attenuation are noted throughout the deep and periventricular white matter of the cerebral hemispheres bilaterally, suggestive of chronic microvascular ischemic changes. Left frontoparietal encephalomalacia. ORBITS: No acute abnormality. SINUSES: No acute abnormality. SOFT TISSUES AND SKULL: No acute soft tissue abnormality. No skull fracture. IMPRESSION: 1. No acute intracranial abnormality. Electronically signed by:  Morgane Naveau MD 05/27/2024 08:58 PM EDT RP Workstation: [id number]   EP PPM/ICD IMPLANT Result Date: 05/27/2024 CONCLUSIONS:  1. 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 No early apparent complications. Ozell Prentice Passey, PA-C Cardiac Electrophysiology   ECHO TEE Result Date: 05/25/2024    TRANSESOPHOGEAL ECHO REPORT   Patient Name:   Betty Golden Date of Exam: 05/25/2024 Medical Rec #:  [id number]         Height:       63.0 in Accession #:    [id number]        Weight:       152.0 lb Date of Birth:  Sep 26, 1945         BSA:          1.721 m Patient Age:    78 years          BP:           144/79 mmHg Patient Gender: F                 HR:           87 bpm. Exam Location:  Inpatient Procedure: Transesophageal Echo, Cardiac Doppler, Limited Color Doppler, Saline            Contrast Bubble Study and 3D Echo (Both Spectral and Color Flow            Doppler were utilized during procedure). Indications:     Endocarditis  History:         Patient has prior history of Echocardiogram examinations, most                  recent 05/07/2024. COPD and hx of CVA, Severe Mitral Annular                  Calcification; Risk Factors:Hypertension, Dyslipidemia and                  Former Smoker.  Sonographer:     Koleen Popper RDCS Referring Phys:  [id number] ROLLO JONELLE LOUDER Diagnosing Phys: Shelda Bruckner MD  Sonographer Comments: Image acquisition challenging due to respiratory motion. PROCEDURE: After discussion of the risks and benefits of a TEE, an informed consent was obtained from the patient. The transesophogeal probe was passed without difficulty through the esophogus of the patient. Imaged were obtained with the patient in a left lateral decubitus position. Sedation performed by different physician. The patient was monitored while under deep sedation. Anesthestetic sedation was provided intravenously by Anesthesiology: 240mg  of Propofol , 80mg  of  Lidocaine . Image quality was adequate. The patient's vital signs; including heart rate, blood pressure, and oxygen saturation; remained stable throughout the procedure. The patient developed no complications during the procedure.  IMPRESSIONS  1. Left ventricular ejection fraction,  by estimation, is 65 to 70%. The left ventricle has normal function.  2. Right ventricular systolic function is normal. The right ventricular size is normal.  3. No left atrial/left atrial appendage thrombus was detected. The LAA emptying velocity was 49 cm/s.  4. There is a small mobile structure on A2 that extends into the atrium, suspect ruptured chordae or degenerative portion of valve. No bacteremia, but in different clinical scenario would be concerning for endocarditis. There is a large focal nodule of calcification on the posterior leaflet, near P2 and the mitral annulus. Below the leaflets, there are calcified and thickened chordae with some mobility on the ventricular aspect of the valve. The mitral valve is degenerative. Mild mitral valve regurgitation. No evidence of mitral stenosis. Severe mitral annular calcification.  5. The aortic valve is tricuspid. There is mild calcification of the aortic valve. There is mild thickening of the aortic valve. Aortic valve regurgitation is not visualized. Aortic valve sclerosis/calcification is present, without any evidence of aortic stenosis.  6. There is Moderate (Grade III) plaque involving the descending aorta.  7. Agitated saline contrast bubble study was negative, with no evidence of any interatrial shunt.  8. 3D performed of the mitral valve and demonstrates Focal calcified nodule on P2/annulus, without mobility. Small mobile structure on A2. Small focal calcified/mobile structures in subvalvular apparatus. 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 No clear cardiac source of embolism. FINDINGS  Left Ventricle: Left ventricular  ejection fraction, by estimation, is 65 to 70%. The left ventricle has normal function. The left ventricular internal cavity size was normal in size. Right Ventricle: The right ventricular size is normal. No increase in right ventricular wall thickness. Right ventricular systolic function is normal. Left Atrium: Left atrial size was normal in size. No left atrial/left atrial appendage thrombus was detected. The LAA emptying velocity was 49 cm/s. Right Atrium: Right atrial size was normal in size. Prominent Eustachian valve. Pericardium: Trivial pericardial effusion is present. Mitral Valve: There is a small mobile structure on A2 that extends into the atrium, suspect ruptured chordae or degenerative portion of valve. No bacteremia, but in different clinical scenario would be concerning for endocarditis. There is a large focal nodule of calcification on the posterior leaflet, near P2 and the mitral annulus. Below the leaflets, there are calcified and thickened chordae with some mobility on the ventricular aspect of the valve. The mitral valve is degenerative in appearance. There is moderate thickening of the mitral valve leaflet(s). Severe mitral annular calcification. Mild mitral valve regurgitation. No evidence of mitral valve stenosis. Tricuspid Valve: The tricuspid valve is normal in structure. Tricuspid valve regurgitation is trivial. No evidence of tricuspid stenosis. Aortic Valve: The aortic valve is tricuspid. There is mild calcification of the aortic valve. There is mild thickening of the aortic valve. There is mild to moderate aortic valve annular calcification. Aortic valve regurgitation is not visualized. Aortic  valve sclerosis/calcification is present, without any evidence of aortic stenosis. Pulmonic Valve: The pulmonic valve was grossly normal. Pulmonic valve regurgitation is trivial. No evidence of pulmonic stenosis. Aorta: The aortic root and ascending aorta are structurally normal, with no evidence  of dilitation. There is moderate (Grade III) plaque involving the descending aorta. IAS/Shunts: No atrial level shunt detected by color flow Doppler. Agitated saline contrast was given intravenously to evaluate for intracardiac shunting. Agitated saline contrast bubble study was negative, with no evidence of any interatrial shunt. Additional Comments: Spectral Doppler performed. LEFT VENTRICLE PLAX 2D LVOT diam:  1.80 cm LVOT Area:     2.54 cm   AORTA Ao Root diam: 2.40 cm  SHUNTS Systemic Diam: 1.80 cm Shelda Bruckner MD Electronically signed by Shelda Bruckner MD Signature Date/Time: 05/25/2024/2:55:21 PM    Final    EP STUDY Result Date: 05/25/2024 See surgical note for result.  VAS US  LOWER EXTREMITY VENOUS (DVT) Result Date: 05/24/2024  Lower Venous DVT Study Patient Name:  Betty Golden  Date of Exam:   05/24/2024 Medical Rec #: [id number]          Accession #:    [id number] Date of Birth: 09-11-1945          Patient Gender: F Patient Age:   31 years Exam Location:  [facility] Procedure:      VAS US  LOWER EXTREMITY VENOUS (DVT) Referring Phys: ROCKY LIKES --------------------------------------------------------------------------------  Indications: Injury to blood vessels, unspecified site T14.90.  Risk Factors: None identified. Limitations: Poor ultrasound/tissue interface. Comparison Study: No prior studies. Performing Technologist: Cordella Collet RVT  Examination Guidelines: A complete evaluation includes B-mode imaging, spectral Doppler, color Doppler, and power Doppler as needed of all accessible portions of each vessel. Bilateral testing is considered an integral part of a complete examination. Limited examinations for reoccurring indications may be performed as noted. The reflux portion of the exam is performed with the patient in reverse Trendelenburg.  +---------+---------------+---------+-----------+----------+--------------+ RIGHT     CompressibilityPhasicitySpontaneityPropertiesThrombus Aging +---------+---------------+---------+-----------+----------+--------------+ CFV      Full           Yes      Yes                                 +---------+---------------+---------+-----------+----------+--------------+ SFJ      Full                                                        +---------+---------------+---------+-----------+----------+--------------+ FV Prox  Full                                                        +---------+---------------+---------+-----------+----------+--------------+ FV Mid   Full                                                        +---------+---------------+---------+-----------+----------+--------------+ FV DistalFull                                                        +---------+---------------+---------+-----------+----------+--------------+ PFV      Full                                                        +---------+---------------+---------+-----------+----------+--------------+  POP      Full           Yes      Yes                                 +---------+---------------+---------+-----------+----------+--------------+ PTV      Full                                                        +---------+---------------+---------+-----------+----------+--------------+ PERO     Full                                                        +---------+---------------+---------+-----------+----------+--------------+   +---------+---------------+---------+-----------+----------+--------------+ LEFT     CompressibilityPhasicitySpontaneityPropertiesThrombus Aging +---------+---------------+---------+-----------+----------+--------------+ CFV      Full           Yes      Yes                                 +---------+---------------+---------+-----------+----------+--------------+ SFJ      Full                                                         +---------+---------------+---------+-----------+----------+--------------+ FV Prox  Full                                                        +---------+---------------+---------+-----------+----------+--------------+ FV Mid   Full                                                        +---------+---------------+---------+-----------+----------+--------------+ FV DistalFull                                                        +---------+---------------+---------+-----------+----------+--------------+ PFV      Full                                                        +---------+---------------+---------+-----------+----------+--------------+ POP      Full           Yes      Yes                                 +---------+---------------+---------+-----------+----------+--------------+  PTV      Full                                                        +---------+---------------+---------+-----------+----------+--------------+ PERO     Full                                                        +---------+---------------+---------+-----------+----------+--------------+     Summary: RIGHT: - There is no evidence of deep vein thrombosis in the lower extremity.  - No cystic structure found in the popliteal fossa.  LEFT: - There is no evidence of deep vein thrombosis in the lower extremity.  - No cystic structure found in the popliteal fossa.  *See table(s) above for measurements and observations. Electronically signed by Debby Robertson on 05/24/2024 at 1:19:10 PM.    Final    CT ANGIO HEAD NECK W WO CM Result Date: 05/24/2024 EXAM: CTA Head and Neck with Intravenous Contrast. CT Head without Contrast. CLINICAL HISTORY: Stroke/TIA, determine embolic source. TECHNIQUE: Axial CTA images of the head and neck performed with intravenous contrast. MIP reconstructed images were created and reviewed. Axial computed tomography images of the head/brain  performed without intravenous contrast. Note: Per PQRS, the description of internal carotid artery percent stenosis, including 0 percent or normal exam, is based on [employer] American Symptomatic Carotid Endarterectomy Trial (NASCET) criteria. Dose reduction technique was used including one or more of the following: automated exposure control, adjustment of mA and kV according to patient size, and/or iterative reconstruction. CONTRAST: Without and with; 75mL (iohexol  (OMNIPAQUE ) 350 MG/ML injection 75 mL IOHEXOL  350 MG/ML SOLN) COMPARISON: MRI head from earlier today. CTA head and neck 04/13/2024 FINDINGS: BRAIN: No acute intraparenchymal hemorrhage. No mass lesion. 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 No midline shift or extra-axial collection. VENTRICLES: No hydrocephalus. ORBITS: The orbits are unremarkable. SINUSES AND MASTOIDS: The paranasal sinuses and mastoid air cells are clear. COMMON CAROTID ARTERIES: No significant stenosis. No dissection or occlusion. INTERNAL CAROTID ARTERIES: No stenosis by NASCET criteria. No dissection or occlusion. VERTEBRAL ARTERIES: No significant stenosis. No dissection or occlusion. ANTERIOR CEREBRAL ARTERIES: No significant stenosis. No occlusion. No aneurysm. MIDDLE CEREBRAL ARTERIES: Similar proximal left M2 MCA occlusion. No new large vessel occlusion. Right MCA is patent. POSTERIOR CEREBRAL ARTERIES: No significant stenosis. No occlusion. No aneurysm. BASILAR ARTERY: No significant stenosis. No occlusion. No aneurysm. LUNG APICES: Unchanged opacities in the peripheral right upper lobe, which may reflect scarring. BONES: No acute osseous abnormality. Bulky bridging osteophytes in the cervical spine. IMPRESSION: 1. No evidence of acute intracranial abnormality. 2. Similar proximal left M2 MCA occlusion. No new large vessel occlusion. Electronically signed by: Gilmore Molt MD 05/24/2024 01:11 AM EDT RP Workstation:  [id number]    Labs:  Basic Metabolic Panel: Recent Labs  Lab 06/16/24 1234 06/16/24 1244 06/17/24 0504 06/22/24 1821  NA 140 141 143  --   K 3.6 3.5 4.1  --   CL 104 109 109  --   CO2 17*  --  17*  --   GLUCOSE 126* 99 85  --   BUN 13 14  9  --   CREATININE 1.12* 0.90 0.84 0.89  CALCIUM  10.2  --  9.4  --     CBC: Recent Labs  Lab 06/16/24 1234 06/16/24 1244 06/17/24 0504 06/22/24 1821  WBC 18.1*  --  13.5* 10.0  NEUTROABS 12.5*  --   --   --   HGB 12.8 13.6 11.7* 11.5*  HCT 39.2 40.0 37.3 34.9*  MCV 86.7  --  88.6 84.9  PLT 194  --  145* 239    CBG: Recent Labs  Lab 06/16/24 1244 06/20/24 2141  GLUCAP 128* 105*  Family history.  Father with CVA and hypertension Sister with CVA.  Denies any colon cancer esophageal cancer or rectal cancer   Brief HPI:   Betty Golden is a 78 y.o. 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  Presented 06/16/2024 after a fall at home after becoming dizzy with altered mental status.  Reportedly she did strike the back of her neck and back.  Noted to have some abdominal pain and leg pain after the fall.  While in the triage, RN drew blood and family present at the bedside and patient suddenly stopped responding to them.  Code stroke was called for acute onset of aphasia.  Cranial CT scan showed no acute intracranial abnormality.  Chronic left parietal lobe infarct.  CT cervical spine negative for acute injury.  CT of the chest abdomen pelvis showed no acute findings.  MRI of the brain showed punctate acute infarct in the right precentral gyrus.  Chronic ischemia with  multiple old infarcts including evolving late subacute to chronic bilateral MCA infarcts.  Admission chemistries unremarkable except glucose 126 creatinine 1.12 lactic acid 4.3-1.7 urine drug screen positive opiates.  EEG negative for seizure remained on Keppra  for seizure prophylaxis.  Latest echocardiogram with ejection fraction of 70 to 75% abnormal motion on the ventricular aspect of the valve noted severe MAC.  TEE showed MV degenerative changes.  Neurology follow-up remained on aspirin  81 mg daily and Plavix  75 mg daily for 3 months then Plavix  alone.  She was cleared to begin Lovenox  for DVT prophylaxis.  Interrogation of loop recorder showed no episodes in the past 18 days.  Therapy evaluations completed due to patient decreased functional mobility was admitted for a comprehensive rehab program.   Hospital Course: Betty Golden was admitted to rehab 06/22/2024 for inpatient therapies to consist of PT, ST and OT at least three hours five days a week. Past admission physiatrist, therapy team and rehab RN have worked together to provide customized collaborative inpatient rehab.  Pertaining to patient's right precentral gyrus infarct in the setting of other subacute chronic likely cardioembolic infarct with history of CVA 10/25 status post loop recorder.  Patient would continue low-dose aspirin  and Plavix  x 3 months then Plavix  alone follow-up neurology services.  Lovenox  ongoing for DVT prophylaxis no bleeding episodes.  History of fibromyalgia pain management she continued on Cymbalta  30 mg twice daily as well as oxycodone  5 mg every 6 hours as needed for breakthrough pain.  Mood stabilization with trazodone  to help aid in sleep as well as melatonin with the use of Klonopin  as needed and emotional support provided.  Blood pressure remained a bit soft her Toprol  was resumed with Norvasc  and Hyzaar currently on hold and follow-up outpatient.  She remained on Keppra  for seizure prophylaxis EEG negative she  will continue this and to follow-up with neurology services.  Crestor  ongoing for hyperlipidemia.  Arrava ongoing for rheumatoid arthritis with no flareups noted.  History of GERD with Protonix  as dictated.  Decreased nutritional storage follow-up dietary services are appetite did improve throughout her hospital course.  Bouts of constipation resolved with laxative assistance.   Blood pressures were monitored on TID basis and remained controlled and monitored   Rehab course: During patient's stay in rehab weekly team conferences were held to monitor patient's progress, set goals and discuss barriers to discharge. At admission, patient required minimal assist 10 feet rolling walker minimal assist step pivot transfers  He/She  has had improvement in activity tolerance, balance, postural control as well as ability to compensate for deficits. He/She has had improvement in functional use RUE/LUE  and RLE/LLE as well as improvement in awareness.  Working with energy conservation techniques.  Needing some encouragement at times to participate.  Supine to sitting edge of bed supervision.  Donned tennis shoes with set up assist and she sat edge of bed.  Completed stand pivot transfer from bed to wheelchair contact-guard minimal assist.  Navigates 12 steps contact-guard minimal assist.  Ambulates up to 125 feet x 3 contact-guard minimal assist.  Close supervision with toileting and transfers.  Supervision upper body bathing supervision lower body bathing supervision upper body dressing minimal assist lower body dressing.  SLP follow-up required supervision assist for use of swallowing compensatory strategies.  Required minimal assist for receptive language and up to moderate assist for expressive to utilize fluency strategies.  Full family teaching completed plan discharge to home       Disposition:  There are no questions and answers to display.         Diet: Regular  Special Instructions: No driving  smoking or alcohol  Plan to continue aspirin  81 mg daily and Plavix  75 mg daily x 3 months then Plavix  alone.  Medications at discharge 1.  Tylenol  as needed 2.  Aspirin  81 mg p.o. daily 3.Trelegy Ellipta  200-60 2.5-25 mcg 1 puff into the lungs every morning 4.  Klonopin  0.25 mg daily as needed anxiety 5.  Plavix  75 mg p.o. daily 6.  Cymbalta  30 mg twice daily 7.Arava  20 mg p.o. daily 8.  Keppra  500 mg p.o. twice daily 9.  Protonix  40 mg p.o. daily 10.  Oxycodone  5 mg every 6 hours as needed pain 11.  Crestor  20 mg p.o. daily 12.  Trazodone  25 mg p.o. nightly 13.  Lyrica  25 mg twice daily 14 Fasenra  30 mg into the skin every 8 weeks 15.Cimzia subcutaneously into the skin every 14 days 16.  Clindamycin 1% lotion twice daily 17.Colestid  1 g daily 18.  Ferrous sulfate  325 mg daily 19.  Albuterol  inhaler 2 puffs every 4 hours as needed 20.  Multivitamin daily 21.  Toprol -XL 25 mg daily  30-35 minutes were spent completing discharge summary and discharge planning  Discharge Instructions     Ambulatory referral to Neurology   Complete by: As directed    An appointment is requested in approximately: 4 weeks right precentral gyrus infarction        Follow-up Information     Kirsteins, Prentice BRAVO, MD Follow up.   Specialty: Physical Medicine and Rehabilitation Why: Call appointment Contact information: [address]  [id number] [place] [address] [phone]         Irven Ozell DEL, MD Follow up.   Specialty: Family Medicine Why: Call for appointment Contact information: [address] [place] [address] [phone]                 Signed: Toribio JINNY Pitch 06/22/2024, 7:44 PM

## 2024-06-22 NOTE — Plan of Care (Signed)
 Pt is alert and oriented. Up with 1 assist and walker to BR. Pt received 1 dose of oxy and 1 dose of prn melatonin this shift. Vitals stable.  Problem: Education: Goal: Knowledge of disease or condition will improve Outcome: Progressing Goal: Knowledge of secondary prevention will improve (MUST DOCUMENT ALL) Outcome: Progressing Goal: Knowledge of patient specific risk factors will improve (DELETE if not current risk factor) Outcome: Progressing   Problem: Ischemic Stroke/TIA Tissue Perfusion: Goal: Complications of ischemic stroke/TIA will be minimized Outcome: Progressing   Problem: Coping: Goal: Will verbalize positive feelings about self Outcome: Progressing Goal: Will identify appropriate support needs Outcome: Progressing   Problem: Health Behavior/Discharge Planning: Goal: Ability to manage health-related needs will improve Outcome: Progressing Goal: Goals will be collaboratively established with patient/family Outcome: Progressing   Problem: Self-Care: Goal: Ability to participate in self-care as condition permits will improve Outcome: Progressing Goal: Verbalization of feelings and concerns over difficulty with self-care will improve Outcome: Progressing Goal: Ability to communicate needs accurately will improve Outcome: Progressing   Problem: Nutrition: Goal: Risk of aspiration will decrease Outcome: Progressing Goal: Dietary intake will improve Outcome: Progressing   Problem: Education: Goal: Knowledge of General Education information will improve Description: Including pain rating scale, medication(s)/side effects and non-pharmacologic comfort measures Outcome: Progressing   Problem: Health Behavior/Discharge Planning: Goal: Ability to manage health-related needs will improve Outcome: Progressing   Problem: Clinical Measurements: Goal: Ability to maintain clinical measurements within normal limits will improve Outcome: Progressing Goal: Will remain free  from infection Outcome: Progressing Goal: Diagnostic test results will improve Outcome: Progressing Goal: Respiratory complications will improve Outcome: Progressing Goal: Cardiovascular complication will be avoided Outcome: Progressing   Problem: Activity: Goal: Risk for activity intolerance will decrease Outcome: Progressing   Problem: Nutrition: Goal: Adequate nutrition will be maintained Outcome: Progressing   Problem: Coping: Goal: Level of anxiety will decrease Outcome: Progressing   Problem: Elimination: Goal: Will not experience complications related to bowel motility Outcome: Progressing Goal: Will not experience complications related to urinary retention Outcome: Progressing   Problem: Pain Managment: Goal: General experience of comfort will improve and/or be controlled Outcome: Progressing   Problem: Safety: Goal: Ability to remain free from injury will improve Outcome: Progressing   Problem: Skin Integrity: Goal: Risk for impaired skin integrity will decrease Outcome: Progressing

## 2024-06-22 NOTE — H&P (Signed)
 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  Presented 06/16/2024 after a fall at home after becoming dizzy and altered mental status.  Reportedly she did strike the back of her neck and back.  Noted to have some abdominal pain and leg pain after the fall.  While in the triage, RN drew blood and family was present at the bedside when patient suddenly stopped responding to them.  Code stroke was called for acute onset of aphasia.  Cranial CT scan showed no acute intracranial abnormality.  Chronic left parietal lobe infarct.  CT cervical spine negative for acute cervical spine injury.  Stable ankylosis of anterior osteophytes at C2-3 C3-4 C5-6 and C6-7 with moderate left neural foraminal stenosis C3-4 C4-5 and C5-6.  CT of the chest abdomen pelvis showed no acute findings.  MRI of the brain showed punctate acute infarct in the right precentral gyrus.  Chronic ischemia with multiple old infarcts including evolving late subacute to chronic bilateral MCA infarcts.  Admission chemistries unremarkable send glucose 126 creatinine 1.12, lactic acid 4.3-1.7, urine drug screen positive opiates.  EEG negative for seizure and remained on Keppra  for seizure prophylaxis.  Latest echocardiogram showed ejection fraction 70 to 75% abnormal motion on the ventricular aspect of the valve, near the severe MAC.  TEE showed MV degenerative changes.  Neurology follow-up currently  remains on aspirin  81 mg daily and Plavix  75 mg daily for 3 months then Plavix  alone.  Patient was cleared to begin Lovenox  for DVT prophylaxis.  Interrogation of loop recorder showed no episodes in the past 18 days.  Tolerating a regular consistency diet.  Therapy evaluations completed due to patient decreased functional mobility was admitted for a comprehensive rehab program.  Review of Systems  Unable to perform ROS: Acuity of condition  Constitutional:  Positive for malaise/fatigue.  HENT:  Positive for hearing loss.   Eyes: Negative.   Respiratory: Negative.  Negative for cough and shortness of breath.   Cardiovascular: Negative.   Gastrointestinal: Negative.   Musculoskeletal:  Positive for myalgias.       Low back, pelvic area and RLE? Pt pointed to L but said RLE multiple times  Skin: Negative.   Neurological:  Positive for speech change and weakness.       Significant aphasia  All other systems reviewed and are negative.  Past Medical History:  Diagnosis Date   Acute ischemic left middle cerebral artery (MCA) stroke (HCC) 04/17/2024   Asthma    C. difficile colitis    Cerebrovascular accident (CVA) due to embolism of precerebral artery (HCC) 05/25/2024   Cervical stenosis of spine    Chronic headaches    Chronic neck pain    Colitis due to Clostridium difficile 02/10/2015   Coronary artery disease    Depression    Diverticulosis    Essential hypertension    Fibromyalgia    GERD (gastroesophageal reflux disease)    History of CVA (cerebrovascular accident) 05/06/2024   History of Salmonella gastroenteritis  HNP (herniated nucleus pulposus), lumbar    Hyperlipidemia    IBS (irritable bowel syndrome)    Lung nodule    rheumatoid arthritis    Sepsis (HCC) 05/22/2024   Sepsis due to pneumonia (HCC) 05/06/2024   Past Surgical History:  Procedure Laterality Date   APPENDECTOMY  1965   BIOPSY  08/26/2018   Procedure: BIOPSY;  Surgeon: Golda Claudis PENNER, MD;  Location:  [facility];  Service: Endoscopy;;  gastric   BREAST BIOPSY     BREAST REDUCTION SURGERY  1994   CATARACT EXTRACTION W/PHACO Left 06/09/2023   Procedure: CATARACT EXTRACTION PHACO AND INTRAOCULAR LENS PLACEMENT (IOC);  Surgeon: Harrie Agent, MD;  Location: [facility];  Service: Ophthalmology;  Laterality: Left;  CDE 7.03   CATARACT EXTRACTION W/PHACO Right 06/23/2023   Procedure: CATARACT EXTRACTION PHACO AND INTRAOCULAR LENS PLACEMENT (IOC);  Surgeon: Harrie Agent, MD;  Location: [facility];  Service: Ophthalmology;  Laterality: Right;  CDE: 11.55   CHOLECYSTECTOMY  1975   COLONOSCOPY N/A 08/19/2013   Procedure: COLONOSCOPY;  Surgeon: Claudis PENNER Golda, MD;  Location: [facility];  Service: Endoscopy;  Laterality: N/A;  155-moved to 140 Ann to notify pt   COLONOSCOPY N/A 08/26/2018   Procedure: COLONOSCOPY;  Surgeon: Golda Claudis PENNER, MD;  Location: [facility];  Service: Endoscopy;  Laterality: N/A;   ESOPHAGOGASTRODUODENOSCOPY N/A 08/26/2018   Procedure: ESOPHAGOGASTRODUODENOSCOPY (EGD);  Surgeon: Golda Claudis PENNER, MD;  Location: [facility];  Service: Endoscopy;  Laterality: N/A;   fibromyalgia     LOOP RECORDER INSERTION N/A 05/26/2024   Procedure: LOOP RECORDER INSERTION;  Surgeon: Lesia Ozell Barter, PA-C;  Location: [facility] INVASIVE CV LAB;  Service: Cardiovascular;  Laterality: N/A;   LUMBAR LAMINECTOMY/DECOMPRESSION MICRODISCECTOMY Bilateral 09/20/2015   Procedure: MICRO LUMBAR DECOMPRESSION L5-S1 BILATERAL    (1 LEVEL);  Surgeon: Reyes Billing, MD;  Location: [facility];  Service: Orthopedics;  Laterality: Bilateral;   rheumatoid arthritis     Sinus Surgery     TONSILLECTOMY  1968   TRANSESOPHAGEAL ECHOCARDIOGRAM (CATH LAB) N/A 05/25/2024   Procedure: TRANSESOPHAGEAL ECHOCARDIOGRAM;  Surgeon: Lonni Slain, MD;  Location: [facility] INVASIVE CV LAB;  Service: Cardiovascular;  Laterality: N/A;   Family History  Problem Relation Age of Onset   Heart disease Mother    Hypertension  Mother    Asthma Mother    Heart disease Father    Stroke Father    Hypertension Father    Stroke Sister    Asthma Sister    Hypertension Brother    Asthma Brother    Asthma Maternal Grandmother    Heart disease Maternal Grandmother    Diabetes Maternal Grandfather    Asthma Maternal Aunt    Colon cancer Neg Hx    Social History:  reports that she quit smoking about 35 years ago. Her smoking use included cigarettes. She started smoking about 45 years ago. She has a 20 pack-year smoking history. She has never used smokeless tobacco. She reports that she does not drink alcohol and does not use drugs. Allergies:  Allergies  Allergen Reactions   Hydromorphone  Anaphylaxis   Sulfa Antibiotics Rash   Medications Prior to Admission  Medication Sig Dispense Refill   acetaminophen  (TYLENOL ) 325 MG tablet Take 1-2 tablets (325-650 mg total) by mouth every 4 (four) hours as needed for mild pain (pain score 1-3). (Patient taking differently: Take 650 mg by mouth daily as needed for mild pain (pain score 1-3) or moderate pain (pain score 4-6).)  albuterol  (VENTOLIN  HFA) 108 (90 Base) MCG/ACT inhaler INHALE 2 PUFFS INTO THE LUNGS EVERY 4 HOURS AS NEEDED FOR WHEEZING OR SHORTNESS OF BREATH 8 g 0   amLODipine  (NORVASC ) 10 MG tablet Take 1 tablet (10 mg total) by mouth daily. 30 tablet 0   aspirin  EC 81 MG tablet Take 1 tablet (81 mg total) by mouth daily. Swallow whole. 30 tablet 12   clonazePAM  (KLONOPIN ) 0.25 MG disintegrating tablet Take 0.25 mg by mouth daily.     clopidogrel  (PLAVIX ) 75 MG tablet Take 1 tablet (75 mg total) by mouth daily. 90 tablet 0   colestipol  (COLESTID ) 1 g tablet Take 1 g by mouth daily.     DULoxetine  (CYMBALTA ) 30 MG capsule Take 1 capsule (30 mg total) by mouth 2 (two) times daily. 60 capsule 0   ferrous sulfate  325 (65 FE) MG EC tablet Take 325 mg by mouth daily.     Fluticasone -Umeclidin-Vilant (TRELEGY ELLIPTA ) 200-62.5-25 MCG/ACT AEPB Inhale 1 puff into the lungs  in the morning. 90 each 3   leflunomide  (ARAVA ) 20 MG tablet Take 20 mg by mouth daily.     losartan -hydrochlorothiazide  (HYZAAR) 100-12.5 MG tablet Take 1 tablet by mouth daily.     meclizine  (ANTIVERT ) 12.5 MG tablet Take 12.5 mg by mouth daily.     melatonin 5 MG TABS Take 1 tablet (5 mg total) by mouth at bedtime. 30 tablet 0   metoprolol  succinate (TOPROL  XL) 50 MG 24 hr tablet Take 1 tablet (50 mg total) by mouth daily. 30 tablet 0   pantoprazole  (PROTONIX ) 40 MG tablet Take 1 tablet (40 mg total) by mouth daily. 30 tablet 0   potassium chloride  (KLOR-CON  M) 10 MEQ tablet Take 1 tablet (10 mEq total) by mouth daily.     pregabalin  (LYRICA ) 25 MG capsule Take 25 mg by mouth 2 (two) times daily.     rosuvastatin  (CRESTOR ) 20 MG tablet Take 1 tablet (20 mg total) by mouth daily. 30 tablet 0   traZODone  (DESYREL ) 50 MG tablet Take 0.5 tablets (25 mg total) by mouth at bedtime. 30 tablet 0   benralizumab  (FASENRA ) 30 MG/ML prefilled syringe Inject 1 mL (30 mg total) into the skin every 8 (eight) weeks. (Patient not taking: Reported on 06/17/2024) 1 mL 6   Certolizumab Pegol (CIMZIA Golva) Inject 1 Dose into the skin every 14 (fourteen) days. (Patient not taking: Reported on 06/14/2024)     clindamycin (CLEOCIN T) 1 % lotion Apply 1 Application topically 2 (two) times daily. (Patient not taking: Reported on 06/17/2024)        Home: Home Living Family/patient expects to be discharged to:: Private residence Living Arrangements:  (daughter and son in social worker) Available Help at Discharge: Family, Available 24 hours/day Type of Home: House Home Access: Stairs to enter [employer] of Steps: 1 stair to the garage with 1/2 rise to entrance is her best option opposed to front entrance Entrance Stairs-Rails: Right, Left Home Layout: Two level, Able to live on main level with bedroom/bathroom, Full bath on main level, Other (Comment) Alternate Level Stairs-Number of Steps: does not go upstairs  or basement Bathroom Shower/Tub: Health Visitor: Standard Bathroom Accessibility: Yes Home Equipment: [employer] (2 wheels), Wheelchair - manual, [place] - single point, BSC/3in1 Additional Comments: confirmed all info with pt again this admission 06/17/24  Lives With: Family, Daughter   Functional History: Prior Function Prior Level of Function : Needs assist Mobility Comments: reports use of RW and assistance for  ambulation, reports she is sedentary ADLs Comments: mostly sponge bathes, does not like the feeling of shower on her body, daughter bathes her, assists with dressing and does all IADLs for pt  Functional Status:  Mobility: Bed Mobility Overal bed mobility: Needs Assistance Bed Mobility: Supine to Sit Supine to sit: Supervision Sit to supine: Supervision General bed mobility comments: increased time Transfers Overall transfer level: Needs assistance Equipment used: Rolling walker (2 wheels) Transfers: Sit to/from Stand Sit to Stand: Min assist Bed to/from chair/wheelchair/BSC transfer type:: Step pivot Step pivot transfers: Min assist General transfer comment: STS from EOB and toilet with light min A for rise and cues for hand placement Ambulation/Gait Ambulation/Gait assistance: Min assist Gait Distance (Feet): 10 Feet (+50) Assistive device: Rolling walker (2 wheels) Gait Pattern/deviations: Decreased step length - right, Decreased step length - left, Decreased stride length, Decreased dorsiflexion - right, Decreased dorsiflexion - left, Knee flexed in stance - right, Trunk flexed, Step-to pattern, Decreased stance time - right General Gait Details: Pt demonstrates short steps with increased time for processing and pt tending to pause after each stride. Cues for sequencing, upward gaze, and increased foot clearance. Pt demonstrates improved pace and fluidity with short distance to the recliner at end of trial with decreased focus on ambulation Gait  velocity: reduced Gait velocity interpretation: <1.31 ft/sec, indicative of household ambulator    ADL: ADL Overall ADL's : Needs assistance/impaired Eating/Feeding: Set up, Bed level Eating/Feeding Details (indicate cue type and reason): only observed drinking with straw Grooming: Wash/dry hands, Wash/dry face, Sitting, Supervision/safety Upper Body Bathing: Minimal assistance, Sitting Lower Body Bathing: Maximal assistance, Sit to/from stand Upper Body Dressing : Set up, Sitting Lower Body Dressing: Minimal assistance, Sitting/lateral leans Lower Body Dressing Details (indicate cue type and reason): long sitting, assist for R sock over toes, can perform figure 4 to reach L foot Toilet Transfer: Minimal assistance, Stand-pivot, BSC/3in1 Toileting- Clothing Manipulation and Hygiene: Moderate assistance, Sit to/from stand  Cognition: Cognition Overall Cognitive Status: History of cognitive impairments - at baseline Arousal/Alertness: Awake/alert Orientation Level: Oriented X4 Year: 2025 Day of Week: Correct Attention: Focused, Sustained Focused Attention: Appears intact Sustained Attention: Appears intact Memory: Appears intact Awareness: Appears intact Problem Solving: Impaired Problem Solving Impairment: Functional basic (calculations) Cognition Arousal: Alert Behavior During Therapy: WFL for tasks assessed/performed Overall Cognitive Status: History of cognitive impairments - at baseline  Physical Exam: Blood pressure 139/70, pulse 95, temperature 98.3 F (36.8 C), temperature source Oral, resp. rate 16, height 5' 3 (1.6 m), weight 59 kg, SpO2 98%. Physical Exam Vitals and nursing note reviewed.  Constitutional:      Appearance: Normal appearance. She is normal weight.     Comments: Pt sitting up in bed; just got to rehab; awake, alert, severe word finding deficits and receptive aphasia, NAD  HENT:     Head: Normocephalic and atraumatic.     Comments:  Coated  tongue L facial droop but corrects with smile    Right Ear: External ear normal.     Left Ear: External ear normal.     Nose: Nose normal. No congestion.     Mouth/Throat:     Mouth: Mucous membranes are moist.     Pharynx: No oropharyngeal exudate.     Comments: Tongue coated Eyes:     General:        Right eye: No discharge.        Left eye: No discharge.     Extraocular Movements: Extraocular movements intact.  Cardiovascular:     Rate and Rhythm: Normal rate and regular rhythm.     Heart sounds: Normal heart sounds. No murmur heard.    No gallop.  Pulmonary:     Effort: Pulmonary effort is normal. No respiratory distress.     Breath sounds: Normal breath sounds. No wheezing, rhonchi or rales.  Abdominal:     General: Bowel sounds are normal. There is no distension.     Palpations: Abdomen is soft.     Tenderness: There is no abdominal tenderness.  Musculoskeletal:     Cervical back: Neck supple.     Comments:  Ue's 4+/5 on LUE and RUE 4- to 4/5 LE's 4+/5 B/L However pt participated with exam in fits and starts,   Skin:    General: Skin is warm.     Comments: Multiple bruises on hands and arms from IV' and ankles and thighs R forearm IV- looks ok  Neurological:     Mental Status: She is alert.     Comments: Patient is alert and makes eye contact with examiner.  Follows simple demonstrated commands.  She is oriented to name however limited exam due to expressive receptive aphasia however she will speaks in small sentences, phrases. Expressive >>receptive aphasia- a lot of word substitutions, and neologisms- sometimes stuttered to get words out- frustrated by this No hoffman's no clonus and no increased tone   Psychiatric:     Comments: Frustrated over speech     Results for orders placed or performed during the hospital encounter of 06/16/24 (from the past 48 hours)  Glucose, capillary     Status: Abnormal   Collection Time: 06/20/24  9:41 PM  Result Value Ref Range    Glucose-Capillary 105 (H) 70 - 99 mg/dL    Comment: Glucose reference range applies only to samples taken after fasting for at least 8 hours.   Comment 1 Notify RN    Comment 2 Document in Chart    No results found.    Blood pressure 139/70, pulse 95, temperature 98.3 F (36.8 C), temperature source Oral, resp. rate 16, height 5' 3 (1.6 m), weight 59 kg, SpO2 98%.  Medical Problem List and Plan: 1. Functional deficits secondary to right precentral gyrus infarction in the setting of other subacute/chronic, likely cardioembolic infarct with history of CVA 10/25 status post loop recorder received CIR  -patient may  shower  -ELOS/Goals: 10-14 days supervision  Admit to CIR- appropriate 2.  Antithrombotics: -DVT/anticoagulation:  Pharmaceutical: Lovenox   -antiplatelet therapy: Aspirin  81 mg daily and Plavix  75 mg daily x 3 months then Plavix  alone 3. Pain Management/fibromyalgia: Cymbalta  30 mg twice daily, oxycodone  5 mg every 6 hours as needed 4. Mood/Behavior/Sleep: Trazodone  25 mg nightly, Klonopin  0.25 mg daily as needed anxiety, melatonin 5 mg nightly as needed sleep  -antipsychotic agents: N/A 5. Neuropsych/cognition: This patient is not capable of making decisions on her own behalf. 6. Skin/Wound Care: Routine skin checks 7. Fluids/Electrolytes/Nutrition: Routine in and outs with follow-up chemistries 8.  Seizure prophylaxis.  Keppra  500 mg twice daily.  EEG negative 9.  COPD/asthma.  Quit smoking 35 years ago.  Continue inhalers as directed 10.  Hyperlipidemia.  Crestor  11.  Rheumatoid arthritis.Arava  20 mg daily 12.  GERD.  Protonix  13. Poor appetite- not eating per pt 14. Constipation?- LBM before this admission- will order KUB to evaluate if needs BM   Toribio JINNY Pitch, PA-C 06/22/2024  I have personally performed a face to face diagnostic evaluation of this  patient and formulated the key components of the plan.  Additionally, I have personally reviewed laboratory  data, imaging studies, as well as relevant notes and concur with the physician assistant's documentation above.   The patient's status has not changed from the original H&P.  Any changes in documentation from the acute care chart have been noted above.

## 2024-06-22 NOTE — Progress Notes (Addendum)
 Betty Bouchard, MD  Physician Physical Medicine and Rehabilitation   PMR Pre-admission    Signed   Date of Service: 06/18/2024  9:11 AM  Related encounter: ED to Hosp-Admission (Current) from 06/16/2024 in [place] [facility]   Signed     Expand All Collapse All  Show:Clear all [x] Written[x] Templated[x] Copied  Added by: [x] Alison Heron MATSU, RN[x] Conetta, Kristyn H[x] Lovorn, Megan, MD  [] Hover for details PMR Admission Coordinator Pre-Admission Assessment   Patient: Betty Golden is an 78 y.o., female MRN: [id number] DOB: 03-27-1946 Height: 5' 3 (160 cm) Weight: 59 kg   Insurance Information HMO:     PPO:      PCP:      IPA:      80/20:      OTHER: [employer] med adv RPPO PRIMARY: BCBS Medicare      Policy#: [id number]      Subscriber: pt CM Name: Abundio   Phone#:  [phone]     Fax#: [phone] Pre-Cert#: [id number]  auth for CIR from [place] with BCBS Medicare for admit 06/22/24 with next review date 06/28/24.  Updates due to [employer] [place] at fax listed above.        Employer:  Benefits:  Phone #: [phone]     Name: 11/21 Eff. Date: 07/29/2014     Deduct: $500/met      Out of Pocket Max: $3000 met      Life Max:  CIR: 1005      SNF: 1005 100 days Outpatient: 100%     Co-Pay:  Home Health: 100%      Co-Pay:  DME: 100%     Co-Pay:  Providers: in network  SECONDARY: none      Policy#:      Phone#:    Artist:       Phone#:    The Data Processing Manager" for patients in Inpatient Rehabilitation Facilities with attached "Privacy Act Statement-Health Care Records" was provided and verbally reviewed with: Family   Emergency Contact Information Contact Information       Name Relation Home Work Mobile    Maxwell,Nicole Daughter     [phone]    BRYAN NORMAN Rom     [phone]    Bryan Denece Rom [phone]   [phone]         Other Contacts   None on File      Current Medical History  Patient  Admitting Diagnosis: CVA   History of Present Illness: Betty Golden is a 78 year old right-handed female with history significant for hypertension, hyperlipidemia, 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  Presented 06/16/2024 after a fall at home after becoming dizzy and altered mental status.  Reportedly she did strike the back of her neck and back.  Noted to have some abdominal pain and leg pain after the fall.     While in the triage, RN drew blood and family was present at the bedside when patient suddenly stopped responding to them.  Code stroke was called for acute onset of aphasia.  Cranial CT scan showed no acute intracranial abnormality.  Chronic left parietal lobe infarct.  CT cervical spine negative for acute cervical spine injury.  Stable ankylosis of anterior osteophytes at C2-3 C3-4 C5-6  and C6-7 with moderate left neural foraminal stenosis C3-4 C4-5 and C5-6.  CT of the chest abdomen pelvis showed no acute findings.  MRI of the brain showed punctate acute infarct in the right precentral gyrus.  Chronic ischemia with multiple old infarcts including evolving late subacute to chronic bilateral MCA infarcts.  Admission chemistries unremarkable send glucose 126 creatinine 1.12, lactic acid 4.3-1.7, urine drug screen positive opiates.     EEG negative for seizure and remained on Keppra  for seizure prophylaxis.  Latest echocardiogram showed ejection fraction 70 to 75% abnormal motion on the ventricular aspect of the valve, near the severe MAC.  TEE showed MV degenerative changes.  Neurology follow-up currently remains on aspirin  81 mg daily and Plavix  75 mg daily for 3 months  then Plavix  alone.  Patient was cleared to begin Lovenox  for DVT prophylaxis.  Interrogation of loop recorder showed no episodes in the past 18 days.  Tolerating a regular consistency diet.    Complete NIHSS TOTAL: 2   Patient's medical record from [facility] has been reviewed by the rehabilitation admission coordinator and physician.   Past Medical History      Past Medical History:  Diagnosis Date   Acute ischemic left middle cerebral artery (MCA) stroke (HCC) 04/17/2024   Asthma     C. difficile colitis     Cerebrovascular accident (CVA) due to embolism of precerebral artery (HCC) 05/25/2024   Cervical stenosis of spine     Chronic headaches     Chronic neck pain     Colitis due to Clostridium difficile 02/10/2015   Coronary artery disease     Depression     Diverticulosis     Essential hypertension     Fibromyalgia     GERD (gastroesophageal reflux disease)     History of CVA (cerebrovascular accident) 05/06/2024   History of Salmonella gastroenteritis     HNP (herniated nucleus pulposus), lumbar     Hyperlipidemia     IBS (irritable bowel syndrome)     Lung nodule     rheumatoid arthritis     Sepsis (HCC) 05/22/2024   Sepsis due to pneumonia (HCC) 05/06/2024        Has the patient had major surgery during 100 days prior to admission? No   Family History   family history includes Asthma in her brother, maternal aunt, maternal grandmother, mother, and sister; Diabetes in her maternal grandfather; Heart disease in her father, maternal grandmother, and mother; Hypertension in her brother, father, and mother; Stroke in her father and sister.   Current Medications  Current Medications    Current Facility-Administered Medications:    acetaminophen  (TYLENOL ) tablet 650 mg, 650 mg, Oral, Q4H PRN, 650 mg at 06/22/24 0232 **OR** acetaminophen  (TYLENOL ) 160 MG/5ML solution 650 mg, 650 mg, Per Tube, Q4H PRN **OR** acetaminophen  (TYLENOL ) suppository 650 mg, 650 mg, Rectal, Q4H  PRN, Melvin, Alexander B, MD   albuterol  (PROVENTIL ) (2.5 MG/3ML) 0.083% nebulizer solution 2.5 mg, 2.5 mg, Nebulization, Q4H PRN, Melvin, Alexander B, MD   aspirin  EC tablet 81 mg, 81 mg, Oral, Daily, Melvin, Alexander B, MD, 81 mg at 06/22/24 1004   budesonide -glycopyrrolate -formoterol  (BREZTRI ) 160-9-4.8 MCG/ACT inhaler 2 puff, 2 puff, Inhalation, BID, Melvin, Alexander B, MD, 2 puff at 06/22/24 9158   clonazepam  (KLONOPIN ) disintegrating tablet 0.25 mg, 0.25 mg, Oral, Daily PRN, Melvin, Alexander B, MD, 0.25 mg at 06/21/24 2121   clopidogrel  (PLAVIX ) tablet 75 mg, 75 mg, Oral, Daily, Melvin, Alexander B, MD, 75 mg at  06/22/24 1004   DULoxetine  (CYMBALTA ) DR capsule 30 mg, 30 mg, Oral, BID, Melvin, Alexander B, MD, 30 mg at 06/22/24 1004   enoxaparin  (LOVENOX ) injection 40 mg, 40 mg, Subcutaneous, Q24H, Melvin, Alexander B, MD, 40 mg at 06/21/24 1821   leflunomide  (ARAVA ) tablet 20 mg, 20 mg, Oral, Daily, Melvin, Alexander B, MD, 20 mg at 06/22/24 1004   [COMPLETED] levETIRAcetam  (KEPPRA ) undiluted injection 1,500 mg, 1,500 mg, Intravenous, Once, 1,500 mg at 06/16/24 1831 **FOLLOWED BY** levETIRAcetam  (KEPPRA ) tablet 500 mg, 500 mg, Oral, BID, Matthews Barnacle M, MD, 500 mg at 06/22/24 1004   melatonin tablet 5 mg, 5 mg, Oral, QHS PRN, Melvin, Alexander B, MD, 5 mg at 06/21/24 2327   ondansetron  (ZOFRAN ) injection 4 mg, 4 mg, Intravenous, Q6H PRN, Drusilla Sabas RAMAN, MD, 4 mg at 06/18/24 9056   oxyCODONE  (Oxy IR/ROXICODONE ) immediate release tablet 5 mg, 5 mg, Oral, Q6H PRN, Drusilla Sabas RAMAN, MD, 5 mg at 06/22/24 0349   pantoprazole  (PROTONIX ) EC tablet 40 mg, 40 mg, Oral, Daily, Melvin, Alexander B, MD, 40 mg at 06/22/24 1004   rosuvastatin  (CRESTOR ) tablet 20 mg, 20 mg, Oral, Daily, Melvin, Alexander B, MD, 20 mg at 06/22/24 1004   senna-docusate (Senokot-S) tablet 1 tablet, 1 tablet, Oral, QHS PRN, Melvin, Alexander B, MD, 1 tablet at 06/20/24 2106   traZODone  (DESYREL ) tablet 25 mg, 25 mg, Oral, QHS,  Melvin, Alexander B, MD, 25 mg at 06/21/24 2116     Patients Current Diet:  Diet Order                  Diet regular Fluid consistency: Thin  Diet effective now                       Precautions / Restrictions Precautions Precautions: Fall Precaution/Restrictions Comments: aphasia at baseline, watch HR Restrictions Weight Bearing Restrictions Per Provider Order: No    Has the patient had 2 or more falls or a fall with injury in the past year? No   Prior Activity Level Limited Community (1-2x/wk): Mod I with RW; dtr assists with adls   Prior Functional Level Self Care: Did the patient need help bathing, dressing, using the toilet or eating? Needed some help   Indoor Mobility: Did the patient need assistance with walking from room to room (with or without device)? Independent   Stairs: Did the patient need assistance with internal or external stairs (with or without device)? Needed some help   Functional Cognition: Did the patient need help planning regular tasks such as shopping or remembering to take medications? Needed some help   Patient Information Are you of Hispanic, Latino/a,or Spanish origin?: A. No, not of Hispanic, Latino/a, or Spanish origin What is your race?:  (portugese) Do you need or want an interpreter to communicate with a doctor or health care staff?: 0. No Patient information obtained via proxy : patient   Patient's Response To:  Health Literacy and Transportation Is the patient able to respond to health literacy and transportation needs?: Yes Health Literacy - How often do you need to have someone help you when you read instructions, pamphlets, or other written material from your doctor or pharmacy?: Sometimes In the past 12 months, has lack of transportation kept you from medical appointments or from getting medications?: No In the past 12 months, has lack of transportation kept you from meetings, work, or from getting things needed for daily  living?: No Higher Education Careers Adviser obtained  via proxy: no   Journalist, Newspaper / Equipment Home Equipment: Agricultural Consultant (2 wheels), Wheelchair - manual, [employer] - single point, BSC/3in1   Prior Device Use: Indicate devices/aids used by the patient prior to current illness, exacerbation or injury? Walker   Current Functional Level Cognition   Arousal/Alertness: Awake/alert Overall Cognitive Status: History of cognitive impairments - at baseline Orientation Level: Oriented X4 Attention: Focused, Sustained Focused Attention: Appears intact Sustained Attention: Appears intact Memory: Appears intact Awareness: Appears intact Problem Solving: Impaired Problem Solving Impairment: Functional basic (calculations)    Extremity Assessment (includes Sensation/Coordination)   Upper Extremity Assessment: Right hand dominant, RUE deficits/detail, LUE deficits/detail RUE Deficits / Details: 3+/5 shoulder, 4/5 elbow, gross graspt 4-/5 LUE Deficits / Details: 4-/5 shoulder, 4/5 elbow to hand  Lower Extremity Assessment: Defer to PT evaluation RLE Deficits / Details: MMT scores of 3+ hip flexion, 4+ knee extension, 4+ ankle dorsiflexion; noted more weakness functionally with pt flexing at bil knees when standing; denied numbness/tingling bil; pain with palpation anteriorly and posteriorly at lower leg, but no obvious deformities, MD aware and ordered pain meds LLE Deficits / Details: MMT scores of 3+ hip flexion, 4+ knee extension, 4+ ankle dorsiflexion; noted more weakness functionally with pt flexing at bil knees when standing; denied numbness/tingling bil     ADLs   Overall ADL's : Needs assistance/impaired Eating/Feeding: Set up, Bed level Eating/Feeding Details (indicate cue type and reason): only observed drinking with straw Grooming: Wash/dry hands, Wash/dry face, Oral care, Contact guard assist, Cueing for sequencing, Standing Upper Body Bathing: Minimal assistance,  Sitting Lower Body Bathing: Moderate assistance, Sit to/from stand Lower Body Bathing Details (indicate cue type and reason): able to bathe feet with figure 4 and mod assist for peri area bathing Upper Body Dressing : Set up, Sitting Upper Body Dressing Details (indicate cue type and reason): gown change Lower Body Dressing: Moderate assistance, Sitting/lateral leans Lower Body Dressing Details (indicate cue type and reason): patient able to doff socks but required assistance to [employer] Transfer: Minimal assistance, Ambulation, Regular Toilet, Rolling walker (2 wheels) Toilet Transfer Details (indicate cue type and reason): cues for safety Toileting- Clothing Manipulation and Hygiene: Minimal assistance, Sit to/from stand Functional mobility during ADLs: Minimal assistance, Rolling walker (2 wheels)     Mobility   Overal bed mobility: Needs Assistance Bed Mobility: Supine to Sit Supine to sit: Min assist Sit to supine: Supervision General bed mobility comments: required assistance with BLE and increased time     Transfers   Overall transfer level: Needs assistance Equipment used: Rolling walker (2 wheels) Transfers: Sit to/from Stand Sit to Stand: Min assist Bed to/from chair/wheelchair/BSC transfer type:: Step pivot Step pivot transfers: Min assist General transfer comment: cues for hand placement and safety with min assist to power up     Ambulation / Gait / Stairs / Wheelchair Mobility   Ambulation/Gait Ambulation/Gait assistance: Editor, Commissioning (Feet): 10 Feet (+50) Assistive device: Rolling walker (2 wheels) Gait Pattern/deviations: Decreased step length - right, Decreased step length - left, Decreased stride length, Decreased dorsiflexion - right, Decreased dorsiflexion - left, Knee flexed in stance - right, Trunk flexed, Step-to pattern, Decreased stance time - right General Gait Details: Pt demonstrates short steps with increased time for processing and pt  tending to pause after each stride. Cues for sequencing, upward gaze, and increased foot clearance. Pt demonstrates improved pace and fluidity with short distance to the recliner at end of trial with decreased focus on ambulation  Gait velocity: reduced Gait velocity interpretation: <1.31 ft/sec, indicative of household ambulator     Posture / Balance Dynamic Sitting Balance Sitting balance - Comments: addressed doffing and donning socks seated on EOB Balance Overall balance assessment: Needs assistance Sitting-balance support: No upper extremity supported, Feet supported Sitting balance-Leahy Scale: Good Sitting balance - Comments: addressed doffing and donning socks seated on EOB Standing balance support: Single extremity supported, Bilateral upper extremity supported, During functional activity Standing balance-Leahy Scale: Poor Standing balance comment: reliant on RW or sink for support High Level Balance Comments: Bx10 toe taps, Bx5 step over obstacle     Special considerations/life events  Recent CIR admit 9/25 [address] placed 10/25    Previous Home Environment  Living Arrangements:  (daughter and son in social worker)  Lives With: Family, Daughter Available Help at Discharge: Family, Available 24 hours/day Type of Home: House Home Layout: Two level, Able to live on main level with bedroom/bathroom, Full bath on main level, Other (Comment) Alternate Level Stairs-Number of Steps: does not go upstairs or basement Home Access: Stairs to enter Entrance Stairs-Rails: Right, Left Entrance Stairs-Number of Steps: 1 stair to the garage with 1/2 rise to entrance is her best option opposed to front entrance Bathroom Shower/Tub: Health Visitor: Standard Bathroom Accessibility: Yes How Accessible: Accessible via walker Home Care Services: Yes Home Care Agency (if known): [facility] after d/c from CIR 9/25 Additional Comments: confirmed all info with pt again this admission  06/17/24   Discharge Living Setting Plans for Discharge Living Setting: Lives with (comment), House (daughter and her family) Type of Home at Discharge: House Discharge Home Layout: Two level, Able to live on main level with bedroom/bathroom Alternate Level Stairs-Rails: Right, Left Discharge Home Access: Stairs to enter Entrance Stairs-Rails: None Entrance Stairs-Number of Steps: 1 stair from the garage Discharge Bathroom Shower/Tub: Walk-in shower Discharge Bathroom Toilet: Standard Discharge Bathroom Accessibility: Yes How Accessible: Accessible via walker Does the patient have any problems obtaining your medications?: No   Social/Family/Support Systems Patient Roles: Parent Contact Information: daughter Anticipated Caregiver: daughter Anticipated Caregiver's Contact Information: see contacts Ability/Limitations of Caregiver: no limitations Caregiver Availability: 24/7 Discharge Plan Discussed with Primary Caregiver: Yes Is Caregiver In Agreement with Plan?: Yes Does Caregiver/Family have Issues with Lodging/Transportation while Pt is in Rehab?: No   Goals Patient/Family Goal for Rehab: supervision PT, OT and SLP Expected length of stay: ELOS 10 to 14 days Pt/Family Agrees to Admission and willing to participate: Yes Program Orientation Provided & Reviewed with Pt/Caregiver Including Roles  & Responsibilities: Yes   Decrease burden of Care through IP rehab admission: n/a   Possible need for SNF placement upon discharge: not anticipated   Patient Condition: This patient's condition remains as documented in the consult dated 06/18/24, in which the Rehabilitation Physician determined and documented that the patient's condition is appropriate for intensive rehabilitative care in an inpatient rehabilitation facility. Will admit to inpatient rehab today.   Preadmission Screen Completed By:  Alison Heron Lot RN MSN 06/22/2024 10:34  AM ______________________________________________________________________   Discussed status with Dr. Cornelio on 06/22/24 at 1030 and received approval for admission today.   Admission Coordinator:  Alison Heron Lot, RN MSN time 1030 Date 06/22/24    Assessment/Plan: Diagnosis:  R Precentral gyral stroke Does the need for close, 24 hr/day Medical supervision in concert with the patient's rehab needs make it unreasonable for this patient to be served in a less intensive setting? Yes Co-Morbidities requiring supervision/potential complications: RA, aphasia- prior stroke;  spinal stenosis; COPD, depression, FMS Due to bladder management, bowel management, safety, skin/wound care, disease management, medication administration, pain management, and patient education, does the patient require 24 hr/day rehab nursing? Yes Does the patient require coordinated care of a physician, rehab nurse, PT, OT, and SLP to address physical and functional deficits in the context of the above medical diagnosis(es)? Yes Addressing deficits in the following areas: balance, endurance, locomotion, strength, transferring, bowel/bladder control, bathing, dressing, feeding, grooming, toileting, cognition, and speech Can the patient actively participate in an intensive therapy program of at least 3 hrs of therapy 5 days a week? Yes The potential for patient to make measurable gains while on inpatient rehab is good Anticipated functional outcomes upon discharge from inpatient rehab: supervision PT, supervision OT, supervision SLP Estimated rehab length of stay to reach the above functional goals is: 10-14 days Anticipated discharge destination: Home 10. Overall Rehab/Functional Prognosis: good     MD Signature:            Revision History  Date/Time User Provider Type Action  06/22/2024 12:20 PM Betty Bouchard, MD Physician Sign  06/22/2024 10:34 AM Alison Heron MATSU, RN Rehab Admission Coordinator Share   06/22/2024 10:30 AM Alison Heron MATSU, RN Rehab Admission Coordinator Share  06/22/2024  9:52 AM Alison Heron MATSU, RN Rehab Admission Coordinator Share  06/21/2024  2:20 PM Burnell Cosmo DEL Rehab Admission Coordinator Share  06/21/2024  2:20 PM Burnell Cosmo DEL Rehab Admission Coordinator Share  06/18/2024  4:27 PM Alison Heron MATSU, RN Rehab Admission Coordinator Share  06/18/2024  9:34 AM Alison, Heron MATSU, RN Rehab Admission Coordinator Share   View Details Report      Routing History

## 2024-06-22 NOTE — Care Management Important Message (Signed)
 Important Message  Patient Details  Name: Betty Golden MRN: [id number] Date of Birth: 01-29-1946   Important Message Given:  No     [name] 06/22/2024, 2:32 PM

## 2024-06-22 NOTE — Progress Notes (Signed)
 Occupational Therapy Session Note  Patient Details  Name: Betty Golden MRN: [id number] Date of Birth: 07/22/1946  {CHL IP REHAB OT TIME CALCULATIONS:[id number]}   Short Term Goals: Week 1:     Skilled Therapeutic Interventions/Progress Updates:    Patient agreeable to participate in OT session. Reports *** pain level.   Patient participated in skilled OT session focusing on ***. Therapist facilitated/assessed/developed/educated/integrated/elicited *** in order to improve/facilitate/promote    Therapy Documentation Precautions:  Restrictions Weight Bearing Restrictions Per Provider Order: No Pain: Pain Assessment Pain Scale: 0-10 Pain Score: 5  Pain Location: Leg ADL:   Vision   Perception    Praxis   Balance   Exercises:   Other Treatments:     Therapy/Group: Individual Therapy  Leita Howell, OTR/L,CBIS  Supplemental OT - [facility] and [facility] Secure Chat Preferred   06/22/2024, 9:15 PM

## 2024-06-22 NOTE — H&P (Signed)
 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  Presented 06/16/2024 after a fall at home after becoming dizzy and altered mental status.  Reportedly she did strike the back of her neck and back.  Noted to have some abdominal pain and leg pain after the fall.  While in the triage, RN drew blood and family was present at the bedside when patient suddenly stopped responding to them.  Code stroke was called for acute onset of aphasia.  Cranial CT scan showed no acute intracranial abnormality.  Chronic left parietal lobe infarct.  CT cervical spine negative for acute cervical spine injury.  Stable ankylosis of anterior osteophytes at C2-3 C3-4 C5-6 and C6-7 with moderate left neural foraminal stenosis C3-4 C4-5 and C5-6.  CT of the chest abdomen pelvis showed no acute findings.  MRI of the brain showed punctate acute infarct in the right precentral gyrus.  Chronic ischemia with multiple old infarcts including evolving late subacute to chronic bilateral MCA infarcts.  Admission chemistries unremarkable send glucose 126 creatinine 1.12, lactic acid 4.3-1.7, urine drug screen positive opiates.  EEG negative for seizure and remained on Keppra  for seizure prophylaxis.  Latest echocardiogram showed ejection fraction 70 to 75% abnormal motion on the ventricular aspect of the valve, near the severe MAC.  TEE showed MV degenerative changes.  Neurology follow-up  currently remains on aspirin  81 mg daily and Plavix  75 mg daily for 3 months then Plavix  alone.  Patient was cleared to begin Lovenox  for DVT prophylaxis.  Interrogation of loop recorder showed no episodes in the past 18 days.  Tolerating a regular consistency diet.  Therapy evaluations completed due to patient decreased functional mobility was admitted for a comprehensive rehab program.   Review of Systems  Unable to perform ROS: Acuity of condition  Constitutional:  Positive for malaise/fatigue.  HENT:  Positive for hearing loss.   Eyes: Negative.   Respiratory: Negative.  Negative for cough and shortness of breath.   Cardiovascular: Negative.   Gastrointestinal: Negative.   Musculoskeletal:  Positive for myalgias.       Low back, pelvic area and RLE? Pt pointed to L but said RLE multiple times  Skin: Negative.   Neurological:  Positive for speech change and weakness.       Significant aphasia  All other systems reviewed and are negative.      Past Medical History:  Diagnosis Date   Acute ischemic left middle cerebral artery (MCA) stroke (HCC) 04/17/2024   Asthma     C. difficile colitis     Cerebrovascular accident (CVA) due to embolism of precerebral artery (HCC) 05/25/2024   Cervical stenosis of spine     Chronic headaches     Chronic neck pain     Colitis due to Clostridium difficile 02/10/2015   Coronary artery disease     Depression     Diverticulosis     Essential hypertension     Fibromyalgia     GERD (  gastroesophageal reflux disease)     History of CVA (cerebrovascular accident) 05/06/2024   History of Salmonella gastroenteritis     HNP (herniated nucleus pulposus), lumbar     Hyperlipidemia     IBS (irritable bowel syndrome)     Lung nodule     rheumatoid arthritis     Sepsis (HCC) 05/22/2024   Sepsis due to pneumonia (HCC) 05/06/2024             Past Surgical History:  Procedure Laterality Date   APPENDECTOMY   1965   BIOPSY   08/26/2018    Procedure:  BIOPSY;  Surgeon: Golda Claudis PENNER, MD;  Location: [facility];  Service: Endoscopy;;  gastric   BREAST BIOPSY       BREAST REDUCTION SURGERY   1994   CATARACT EXTRACTION W/PHACO Left 06/09/2023    Procedure: CATARACT EXTRACTION PHACO AND INTRAOCULAR LENS PLACEMENT (IOC);  Surgeon: Harrie Agent, MD;  Location: [facility];  Service: Ophthalmology;  Laterality: Left;  CDE 7.03   CATARACT EXTRACTION W/PHACO Right 06/23/2023    Procedure: CATARACT EXTRACTION PHACO AND INTRAOCULAR LENS PLACEMENT (IOC);  Surgeon: Harrie Agent, MD;  Location: [facility];  Service: Ophthalmology;  Laterality: Right;  CDE: 11.55   CHOLECYSTECTOMY   1975   COLONOSCOPY N/A 08/19/2013    Procedure: COLONOSCOPY;  Surgeon: Claudis PENNER Golda, MD;  Location: [facility];  Service: Endoscopy;  Laterality: N/A;  155-moved to 140 Ann to notify pt   COLONOSCOPY N/A 08/26/2018    Procedure: COLONOSCOPY;  Surgeon: Golda Claudis PENNER, MD;  Location: [facility];  Service: Endoscopy;  Laterality: N/A;   ESOPHAGOGASTRODUODENOSCOPY N/A 08/26/2018    Procedure: ESOPHAGOGASTRODUODENOSCOPY (EGD);  Surgeon: Golda Claudis PENNER, MD;  Location: [facility];  Service: Endoscopy;  Laterality: N/A;   fibromyalgia       LOOP RECORDER INSERTION N/A 05/26/2024    Procedure: LOOP RECORDER INSERTION;  Surgeon: Lesia Ozell Barter, PA-C;  Location: [facility] INVASIVE CV LAB;  Service: Cardiovascular;  Laterality: N/A;   LUMBAR LAMINECTOMY/DECOMPRESSION MICRODISCECTOMY Bilateral 09/20/2015    Procedure: MICRO LUMBAR DECOMPRESSION L5-S1 BILATERAL    (1 LEVEL);  Surgeon: Reyes Billing, MD;  Location: [facility];  Service: Orthopedics;  Laterality: Bilateral;   rheumatoid arthritis       Sinus Surgery       TONSILLECTOMY   1968   TRANSESOPHAGEAL ECHOCARDIOGRAM (CATH LAB) N/A 05/25/2024    Procedure: TRANSESOPHAGEAL ECHOCARDIOGRAM;  Surgeon: Lonni Slain, MD;  Location: [facility] INVASIVE CV LAB;  Service: Cardiovascular;  Laterality: N/A;             Family  History  Problem Relation Age of Onset   Heart disease Mother     Hypertension Mother     Asthma Mother     Heart disease Father     Stroke Father     Hypertension Father     Stroke Sister     Asthma Sister     Hypertension Brother     Asthma Brother     Asthma Maternal Grandmother     Heart disease Maternal Grandmother     Diabetes Maternal Grandfather     Asthma Maternal Aunt     Colon cancer Neg Hx          Social History:  reports that she quit smoking about 35 years ago. Her smoking use included cigarettes. She started smoking about 45 years ago. She has a 20 pack-year smoking history. She has never used smokeless tobacco. She  reports that she does not drink alcohol and does not use drugs. Allergies:  Allergies      Allergies  Allergen Reactions   Hydromorphone  Anaphylaxis   Sulfa Antibiotics Rash            Medications Prior to Admission  Medication Sig Dispense Refill   acetaminophen  (TYLENOL ) 325 MG tablet Take 1-2 tablets (325-650 mg total) by mouth every 4 (four) hours as needed for mild pain (pain score 1-3). (Patient taking differently: Take 650 mg by mouth daily as needed for mild pain (pain score 1-3) or moderate pain (pain score 4-6).)       albuterol  (VENTOLIN  HFA) 108 (90 Base) MCG/ACT inhaler INHALE 2 PUFFS INTO THE LUNGS EVERY 4 HOURS AS NEEDED FOR WHEEZING OR SHORTNESS OF BREATH 8 g 0   amLODipine  (NORVASC ) 10 MG tablet Take 1 tablet (10 mg total) by mouth daily. 30 tablet 0   aspirin  EC 81 MG tablet Take 1 tablet (81 mg total) by mouth daily. Swallow whole. 30 tablet 12   clonazePAM  (KLONOPIN ) 0.25 MG disintegrating tablet Take 0.25 mg by mouth daily.       clopidogrel  (PLAVIX ) 75 MG tablet Take 1 tablet (75 mg total) by mouth daily. 90 tablet 0   colestipol  (COLESTID ) 1 g tablet Take 1 g by mouth daily.       DULoxetine  (CYMBALTA ) 30 MG capsule Take 1 capsule (30 mg total) by mouth 2 (two) times daily. 60 capsule 0   ferrous sulfate  325 (65 FE) MG EC  tablet Take 325 mg by mouth daily.       Fluticasone -Umeclidin-Vilant (TRELEGY ELLIPTA ) 200-62.5-25 MCG/ACT AEPB Inhale 1 puff into the lungs in the morning. 90 each 3   leflunomide  (ARAVA ) 20 MG tablet Take 20 mg by mouth daily.       losartan -hydrochlorothiazide  (HYZAAR) 100-12.5 MG tablet Take 1 tablet by mouth daily.       meclizine  (ANTIVERT ) 12.5 MG tablet Take 12.5 mg by mouth daily.       melatonin 5 MG TABS Take 1 tablet (5 mg total) by mouth at bedtime. 30 tablet 0   metoprolol  succinate (TOPROL  XL) 50 MG 24 hr tablet Take 1 tablet (50 mg total) by mouth daily. 30 tablet 0   pantoprazole  (PROTONIX ) 40 MG tablet Take 1 tablet (40 mg total) by mouth daily. 30 tablet 0   potassium chloride  (KLOR-CON  M) 10 MEQ tablet Take 1 tablet (10 mEq total) by mouth daily.       pregabalin  (LYRICA ) 25 MG capsule Take 25 mg by mouth 2 (two) times daily.       rosuvastatin  (CRESTOR ) 20 MG tablet Take 1 tablet (20 mg total) by mouth daily. 30 tablet 0   traZODone  (DESYREL ) 50 MG tablet Take 0.5 tablets (25 mg total) by mouth at bedtime. 30 tablet 0   benralizumab  (FASENRA ) 30 MG/ML prefilled syringe Inject 1 mL (30 mg total) into the skin every 8 (eight) weeks. (Patient not taking: Reported on 06/17/2024) 1 mL 6   Certolizumab Pegol (CIMZIA Forsan) Inject 1 Dose into the skin every 14 (fourteen) days. (Patient not taking: Reported on 06/14/2024)       clindamycin (CLEOCIN T) 1 % lotion Apply 1 Application topically 2 (two) times daily. (Patient not taking: Reported on 06/17/2024)                  Home: Home Living Family/patient expects to be discharged to:: Private residence Living Arrangements:  (daughter and son in  law) Available Help at Discharge: Family, Available 24 hours/day Type of Home: House Home Access: Stairs to enter [employer] of Steps: 1 stair to the garage with 1/2 rise to entrance is her best option opposed to front entrance Entrance Stairs-Rails: Right, Left Home  Layout: Two level, Able to live on main level with bedroom/bathroom, Full bath on main level, Other (Comment) Alternate Level Stairs-Number of Steps: does not go upstairs or basement Bathroom Shower/Tub: Health Visitor: Standard Bathroom Accessibility: Yes Home Equipment: [employer] (2 wheels), Wheelchair - manual, [employer] - single point, BSC/3in1 Additional Comments: confirmed all info with pt again this admission 06/17/24  Lives With: Family, Daughter   Functional History: Prior Function Prior Level of Function : Needs assist Mobility Comments: reports use of RW and assistance for ambulation, reports she is sedentary ADLs Comments: mostly sponge bathes, does not like the feeling of shower on her body, daughter bathes her, assists with dressing and does all IADLs for pt   Functional Status:  Mobility: Bed Mobility Overal bed mobility: Needs Assistance Bed Mobility: Supine to Sit Supine to sit: Supervision Sit to supine: Supervision General bed mobility comments: increased time Transfers Overall transfer level: Needs assistance Equipment used: Rolling walker (2 wheels) Transfers: Sit to/from Stand Sit to Stand: Min assist Bed to/from chair/wheelchair/BSC transfer type:: Step pivot Step pivot transfers: Min assist General transfer comment: STS from EOB and toilet with light min A for rise and cues for hand placement Ambulation/Gait Ambulation/Gait assistance: Min assist Gait Distance (Feet): 10 Feet (+50) Assistive device: Rolling walker (2 wheels) Gait Pattern/deviations: Decreased step length - right, Decreased step length - left, Decreased stride length, Decreased dorsiflexion - right, Decreased dorsiflexion - left, Knee flexed in stance - right, Trunk flexed, Step-to pattern, Decreased stance time - right General Gait Details: Pt demonstrates short steps with increased time for processing and pt tending to pause after each stride. Cues for sequencing, upward  gaze, and increased foot clearance. Pt demonstrates improved pace and fluidity with short distance to the recliner at end of trial with decreased focus on ambulation Gait velocity: reduced Gait velocity interpretation: <1.31 ft/sec, indicative of household ambulator   ADL: ADL Overall ADL's : Needs assistance/impaired Eating/Feeding: Set up, Bed level Eating/Feeding Details (indicate cue type and reason): only observed drinking with straw Grooming: Wash/dry hands, Wash/dry face, Sitting, Supervision/safety Upper Body Bathing: Minimal assistance, Sitting Lower Body Bathing: Maximal assistance, Sit to/from stand Upper Body Dressing : Set up, Sitting Lower Body Dressing: Minimal assistance, Sitting/lateral leans Lower Body Dressing Details (indicate cue type and reason): long sitting, assist for R sock over toes, can perform figure 4 to reach L foot Toilet Transfer: Minimal assistance, Stand-pivot, BSC/3in1 Toileting- Clothing Manipulation and Hygiene: Moderate assistance, Sit to/from stand   Cognition: Cognition Overall Cognitive Status: History of cognitive impairments - at baseline Arousal/Alertness: Awake/alert Orientation Level: Oriented X4 Year: 2025 Day of Week: Correct Attention: Focused, Sustained Focused Attention: Appears intact Sustained Attention: Appears intact Memory: Appears intact Awareness: Appears intact Problem Solving: Impaired Problem Solving Impairment: Functional basic (calculations) Cognition Arousal: Alert Behavior During Therapy: WFL for tasks assessed/performed Overall Cognitive Status: History of cognitive impairments - at baseline   Physical Exam: Blood pressure 139/70, pulse 95, temperature 98.3 F (36.8 C), temperature source Oral, resp. rate 16, height 5' 3 (1.6 m), weight 59 kg, SpO2 98%. Physical Exam Vitals and nursing note reviewed.  Constitutional:      Appearance: Normal appearance. She is normal weight.  Comments: Pt sitting up in  bed; just got to rehab; awake, alert, severe word finding deficits and receptive aphasia, NAD  HENT:     Head: Normocephalic and atraumatic.     Comments:  Coated tongue L facial droop but corrects with smile    Right Ear: External ear normal.     Left Ear: External ear normal.     Nose: Nose normal. No congestion.     Mouth/Throat:     Mouth: Mucous membranes are moist.     Pharynx: No oropharyngeal exudate.     Comments: Tongue coated Eyes:     General:        Right eye: No discharge.        Left eye: No discharge.     Extraocular Movements: Extraocular movements intact.  Cardiovascular:     Rate and Rhythm: Normal rate and regular rhythm.     Heart sounds: Normal heart sounds. No murmur heard.    No gallop.  Pulmonary:     Effort: Pulmonary effort is normal. No respiratory distress.     Breath sounds: Normal breath sounds. No wheezing, rhonchi or rales.  Abdominal:     General: Bowel sounds are normal. There is no distension.     Palpations: Abdomen is soft.     Tenderness: There is no abdominal tenderness.  Musculoskeletal:     Cervical back: Neck supple.     Comments:  Ue's 4+/5 on LUE and RUE 4- to 4/5 LE's 4+/5 B/L However pt participated with exam in fits and starts,   Skin:    General: Skin is warm.     Comments: Multiple bruises on hands and arms from IV' and ankles and thighs R forearm IV- looks ok  Neurological:     Mental Status: She is alert.     Comments: Patient is alert and makes eye contact with examiner.  Follows simple demonstrated commands.  She is oriented to name however limited exam due to expressive receptive aphasia however she will speaks in small sentences, phrases. Expressive >>receptive aphasia- a lot of word substitutions, and neologisms- sometimes stuttered to get words out- frustrated by this No hoffman's no clonus and no increased tone   Psychiatric:     Comments: Frustrated over speech       Lab Results Last 48 Hours        Results  for orders placed or performed during the hospital encounter of 06/16/24 (from the past 48 hours)  Glucose, capillary     Status: Abnormal    Collection Time: 06/20/24  9:41 PM  Result Value Ref Range    Glucose-Capillary 105 (H) 70 - 99 mg/dL      Comment: Glucose reference range applies only to samples taken after fasting for at least 8 hours.    Comment 1 Notify RN      Comment 2 Document in Chart        Imaging Results (Last 48 hours)  No results found.         Blood pressure 139/70, pulse 95, temperature 98.3 F (36.8 C), temperature source Oral, resp. rate 16, height 5' 3 (1.6 m), weight 59 kg, SpO2 98%.   Medical Problem List and Plan: 1. Functional deficits secondary to right precentral gyrus infarction in the setting of other subacute/chronic, likely cardioembolic infarct with history of CVA 10/25 status post loop recorder received CIR             -patient may  shower             -  ELOS/Goals: 10-14 days supervision             Admit to CIR- appropriate 2.  Antithrombotics: -DVT/anticoagulation:  Pharmaceutical: Lovenox              -antiplatelet therapy: Aspirin  81 mg daily and Plavix  75 mg daily x 3 months then Plavix  alone 3. Pain Management/fibromyalgia: Cymbalta  30 mg twice daily, oxycodone  5 mg every 6 hours as needed 4. Mood/Behavior/Sleep: Trazodone  25 mg nightly, Klonopin  0.25 mg daily as needed anxiety, melatonin 5 mg nightly as needed sleep             -antipsychotic agents: N/A 5. Neuropsych/cognition: This patient is not capable of making decisions on her own behalf. 6. Skin/Wound Care: Routine skin checks 7. Fluids/Electrolytes/Nutrition: Routine in and outs with follow-up chemistries 8.  Seizure prophylaxis.  Keppra  500 mg twice daily.  EEG negative 9.  COPD/asthma.  Quit smoking 35 years ago.  Continue inhalers as directed 10.  Hyperlipidemia.  Crestor  11.  Rheumatoid arthritis.Arava  20 mg daily 12.  GERD.  Protonix  13. Poor appetite- not eating per  pt 14. Constipation?- LBM before this admission- will order KUB to evaluate if needs BM     Toribio JINNY Pitch, PA-C 06/22/2024   I have personally performed a face to face diagnostic evaluation of this patient and formulated the key components of the plan.  Additionally, I have personally reviewed laboratory data, imaging studies, as well as relevant notes and concur with the physician assistant's documentation above.   The patient's status has not changed from the original H&P.  Any changes in documentation from the acute care chart have been noted above.

## 2024-06-22 NOTE — TOC Transition Note (Signed)
 Transition of Care [facility]) - Discharge Note   Patient Details  Name: Betty Golden MRN: [id number] Date of Birth: 08/06/1945  Transition of Care [facility]) CM/SW Contact:  Betty CHRISTELLA Goodie, LCSW Phone Number: 06/22/2024, 11:25 AM   Clinical Narrative:   CSW updated by Rehab Admissions that patient has a bed at CIR today, no further inpatient care management needs.     Final next level of care: IP Rehab Facility Barriers to Discharge: Barriers Resolved   Patient Goals and CMS Choice   CMS Medicare.gov Compare Post Acute Care list provided to:: Patient Choice offered to / list presented to : Patient      Discharge Placement                       Discharge Plan and Services Additional resources added to the After Visit Summary for                                       Social Drivers of Health (SDOH) Interventions SDOH Screenings   Food Insecurity: Patient Unable To Answer (06/18/2024)  Housing: Unknown (06/18/2024)  Transportation Needs: Patient Unable To Answer (06/18/2024)  Utilities: Patient Unable To Answer (06/18/2024)  Financial Resource Strain: Low Risk  (04/04/2019)  Physical Activity: Inactive (04/04/2019)  Social Connections: Unknown (06/18/2024)  Recent Concern: Social Connections - Moderately Isolated (05/23/2024)  Stress: No Stress Concern Present (04/04/2019)  Tobacco Use: Medium Risk (06/14/2024)     Readmission Risk Interventions    05/24/2024   11:49 AM  Readmission Risk Prevention Plan  Transportation Screening Complete  PCP or Specialist Appt within 3-5 Days Complete  HRI or Home Care Consult Complete  Social Work Consult for Recovery Care Planning/Counseling Complete  Palliative Care Screening Complete  Medication Review Oceanographer) Referral to Pharmacy

## 2024-06-23 ENCOUNTER — Ambulatory Visit

## 2024-06-23 ENCOUNTER — Ambulatory Visit: Admitting: Allergy & Immunology

## 2024-06-23 DIAGNOSIS — I69320 Aphasia following cerebral infarction: Secondary | ICD-10-CM | POA: Diagnosis not present

## 2024-06-23 DIAGNOSIS — R269 Unspecified abnormalities of gait and mobility: Secondary | ICD-10-CM

## 2024-06-23 DIAGNOSIS — I639 Cerebral infarction, unspecified: Secondary | ICD-10-CM | POA: Diagnosis not present

## 2024-06-23 DIAGNOSIS — I69398 Other sequelae of cerebral infarction: Secondary | ICD-10-CM | POA: Diagnosis not present

## 2024-06-23 DIAGNOSIS — I69319 Unspecified symptoms and signs involving cognitive functions following cerebral infarction: Secondary | ICD-10-CM

## 2024-06-23 LAB — COMPREHENSIVE METABOLIC PANEL WITH GFR
ALT: 15 U/L (ref 0–44)
AST: 21 U/L (ref 15–41)
Albumin: 2.9 g/dL — ABNORMAL LOW (ref 3.5–5.0)
Alkaline Phosphatase: 47 U/L (ref 38–126)
Anion gap: 9 (ref 5–15)
BUN: 10 mg/dL (ref 8–23)
CO2: 28 mmol/L (ref 22–32)
Calcium: 9.4 mg/dL (ref 8.9–10.3)
Chloride: 105 mmol/L (ref 98–111)
Creatinine, Ser: 0.84 mg/dL (ref 0.44–1.00)
GFR, Estimated: >60 mL/min (ref >60)
Glucose, Bld: 94 mg/dL (ref 70–99)
Potassium: 3.6 mmol/L (ref 3.5–5.1)
Sodium: 142 mmol/L (ref 135–145)
Total Bilirubin: 0.4 mg/dL (ref 0.0–1.2)
Total Protein: 5.8 g/dL — ABNORMAL LOW (ref 6.5–8.1)

## 2024-06-23 LAB — CBC WITH DIFFERENTIAL/PLATELET
Abs Immature Granulocytes: 0.03 K/uL (ref 0.00–0.07)
Basophils Absolute: 0 K/uL (ref 0.0–0.1)
Basophils Relative: 0 %
Eosinophils Absolute: 0 K/uL (ref 0.0–0.5)
Eosinophils Relative: 0 %
HCT: 32.1 % — ABNORMAL LOW (ref 36.0–46.0)
Hemoglobin: 10.4 g/dL — ABNORMAL LOW (ref 12.0–15.0)
Immature Granulocytes: 0 %
Lymphocytes Relative: 52 %
Lymphs Abs: 4.1 K/uL — ABNORMAL HIGH (ref 0.7–4.0)
MCH: 27.8 pg (ref 26.0–34.0)
MCHC: 32.4 g/dL (ref 30.0–36.0)
MCV: 85.8 fL (ref 80.0–100.0)
Monocytes Absolute: 0.7 K/uL (ref 0.1–1.0)
Monocytes Relative: 8 %
Neutro Abs: 3.3 K/uL (ref 1.7–7.7)
Neutrophils Relative %: 40 %
Platelets: 204 K/uL (ref 150–400)
RBC: 3.74 MIL/uL — ABNORMAL LOW (ref 3.87–5.11)
RDW: 14.5 % (ref 11.5–15.5)
WBC: 8.1 K/uL (ref 4.0–10.5)
nRBC: 0 % (ref 0.0–0.2)

## 2024-06-23 MED ORDER — SENNOSIDES-DOCUSATE SODIUM 8.6-50 MG PO TABS
2.0000 | ORAL_TABLET | Freq: Every day | ORAL | Status: DC
  Administered 2024-06-24 – 2024-06-27 (×4): 2 via ORAL
  Filled 2024-06-23 (×4): qty 2

## 2024-06-23 MED ORDER — FLEET ENEMA RE ENEM
1.0000 | ENEMA | Freq: Once | RECTAL | Status: AC
  Administered 2024-06-23: 1 via RECTAL
  Filled 2024-06-23: qty 1

## 2024-06-23 MED ORDER — ENSURE PLUS HIGH PROTEIN PO LIQD
237.0000 mL | Freq: Two times a day (BID) | ORAL | Status: DC
  Administered 2024-06-23 – 2024-06-25 (×5): 237 mL via ORAL

## 2024-06-23 NOTE — Plan of Care (Signed)
  Problem: RH Swallowing Goal: LTG Patient will consume least restrictive diet using compensatory strategies with assistance (SLP) Description: LTG:  Patient will consume least restrictive diet using compensatory strategies with assistance (SLP) Flowsheets (Taken 06/23/2024 1033) LTG: Pt Patient will consume least restrictive diet using compensatory strategies with assistance of (SLP): Supervision   Problem: RH Comprehension Communication Goal: LTG Patient will comprehend basic/complex auditory (SLP) Description: LTG: Patient will comprehend basic/complex auditory information with cues (SLP). Flowsheets (Taken 06/23/2024 1033) LTG: Patient will comprehend: Complex auditory information LTG: Patient will comprehend auditory information with cueing (SLP): Minimal Assistance - Patient > 75%   Problem: RH Expression Communication Goal: LTG Patient will verbally express basic/complex needs(SLP) Description: LTG:  Patient will verbally express basic/complex needs, wants or ideas with cues  (SLP) Flowsheets (Taken 06/23/2024 1033) LTG: Patient will verbally express basic/complex needs, wants or ideas (SLP): Minimal Assistance - Patient > 75% Goal: LTG Patient will increase word finding of common (SLP) Description: LTG:  Patient will increase word finding of common objects/daily info/abstract thoughts with cues using compensatory strategies (SLP). Flowsheets (Taken 06/23/2024 1033) LTG: Patient will increase word finding of common (SLP): Minimal Assistance - Patient > 75% Patient will use compensatory strategies to increase word finding of: Daily info

## 2024-06-23 NOTE — Progress Notes (Signed)
 Inpatient Rehabilitation Center Individual Statement of Services  Patient Name:  Betty Golden  Date:  06/23/2024  Welcome to the Inpatient Rehabilitation Center.  Our goal is to provide you with an individualized program based on your diagnosis and situation, designed to meet your specific needs.  With this comprehensive rehabilitation program, you will be expected to participate in at least 3 hours of rehabilitation therapies Monday-Friday, with modified therapy programming on the weekends.  Your rehabilitation program will include the following services:  Physical Therapy (PT), Occupational Therapy (OT), Speech Therapy (ST), 24 hour per day rehabilitation nursing, Therapeutic Recreaction (TR), Care Coordinator, Rehabilitation Medicine, Nutrition Services, and Pharmacy Services  Weekly team conferences will be held on Wednesday to discuss your progress.  Your Inpatient Rehabilitation Care Coordinator will talk with you frequently to get your input and to update you on team discussions.  Team conferences with you and your family in attendance may also be held.  Expected length of stay: 7-9 days  Overall anticipated outcome: supervision with cues  Depending on your progress and recovery, your program may change. Your Inpatient Rehabilitation Care Coordinator will coordinate services and will keep you informed of any changes. Your Inpatient Rehabilitation Care Coordinator's name and contact numbers are listed  below.  The following services may also be recommended but are not provided by the Inpatient Rehabilitation Center:   Home Health Rehabiltiation Services Outpatient Rehabilitation Services    Arrangements will be made to provide these services after discharge if needed.  Arrangements include referral to agencies that provide these services.  Your insurance has been verified to be:  [employer] Your primary doctor is:  Ozell Polite  Pertinent information will be shared with  your doctor and your insurance company.  Inpatient Rehabilitation Care Coordinator:  Rhoda Clement, KEN [phone] or ELIGAH BASQUES  Information discussed with and copy given to patient by: Clement Asberry MATSU, 06/23/2024, 9:15 AM

## 2024-06-23 NOTE — Plan of Care (Signed)
  Problem: RH Balance Goal: LTG Patient will maintain dynamic standing with ADLs (OT) Description: LTG:  Patient will maintain dynamic standing balance with assist during activities of daily living (OT)  Flowsheets (Taken 06/23/2024 1322) LTG: Pt will maintain dynamic standing balance during ADLs with: (mod ind to manage clothing during toileting tasks) Independent with assistive device Note: mod ind to manage clothing during toileting tasks    Problem: Sit to Stand Goal: LTG:  Patient will perform sit to stand in prep for activites of daily living with assistance level (OT) Description: LTG:  Patient will perform sit to stand in prep for activites of daily living with assistance level (OT) Flowsheets (Taken 06/23/2024 1322) LTG: PT will perform sit to stand in prep for activites of daily living with assistance level: Independent with assistive device   Problem: RH Dressing Goal: LTG Patient will perform lower body dressing w/assist (OT) Description: LTG: Patient will perform lower body dressing with assist, with/without cues in positioning using equipment (OT) Flowsheets (Taken 06/23/2024 1322) LTG: Pt will perform lower body dressing with assistance level of: Supervision/Verbal cueing   Problem: RH Toileting Goal: LTG Patient will perform toileting task (3/3 steps) with assistance level (OT) Description: LTG: Patient will perform toileting task (3/3 steps) with assistance level (OT)  Flowsheets (Taken 06/23/2024 1322) LTG: Pt will perform toileting task (3/3 steps) with assistance level: Independent with assistive device   Problem: RH Toilet Transfers Goal: LTG Patient will perform toilet transfers w/assist (OT) Description: LTG: Patient will perform toilet transfers with assist, with/without cues using equipment (OT) Flowsheets (Taken 06/23/2024 1322) LTG: Pt will perform toilet transfers with assistance level of: Supervision/Verbal cueing   Problem: RH Tub/Shower Transfers Goal:  LTG Patient will perform tub/shower transfers w/assist (OT) Description: LTG: Patient will perform tub/shower transfers with assist, with/without cues using equipment (OT) Flowsheets (Taken 06/23/2024 1322) LTG: Pt will perform tub/shower stall transfers with assistance level of: Supervision/Verbal cueing LTG: Pt will perform tub/shower transfers from: Walk in shower

## 2024-06-23 NOTE — Progress Notes (Signed)
 Inpatient Rehabilitation  Patient information reviewed and entered into eRehab system by [facility]. Karen Kays., CCC/SLP, PPS Coordinator.  Information including medical coding, functional ability and quality indicators will be reviewed and updated through discharge.

## 2024-06-23 NOTE — Progress Notes (Addendum)
 Inpatient Rehabilitation Admission Medication Review by a Pharmacist  A complete drug regimen review was completed for this patient to identify any potential clinically significant medication issues.  High Risk Drug Classes Is patient taking? Indication by Medication  Antipsychotic No   Anticoagulant Yes Enoxaparin  - DVT prophylaxis  Antibiotic No   Opioid Yes Oxycodone  - pain  Antiplatelet Yes ASA/Clopidogrel  - Stroke  Hypoglycemics/insulin No   Vasoactive Medication No   Chemotherapy No   Other Yes Senokot-S  and - constipation Pantoprazole - reflux  Acetaminophen - pain   melatonin, trazodone , and -insomnia Breztri /albuterol  - asthma Duloxetine  - mood Ensure - supplement Leflunomide  - RA Levetiracetam  - Seizure Rosuvastatin   - HLD Clonazepam  - anxiety     Type of Medication Issue Identified Description of Issue Recommendation(s)  Drug Interaction(s) (clinically significant)     Duplicate Therapy     Allergy     No Medication Administration End Date     Incorrect Dose     Additional Drug Therapy Needed     Significant med changes from prior encounter (inform family/care partners about these prior to discharge). Benralizumab  and Certolizumab held PTA because patient was feeling sick Amlodipine , losartan -hydrochlorothiazide , meclizine , potassium chloride , pregabalin  from PTA meds discontinued. Communicate relevant medication changes to patient/family members at discharge from CIR.   Restart or discontinue PTA meds not resumed in CIR at discharge if clinically indicated.   Other       Clinically significant medication issues were identified that warrant physician communication and completion of prescribed/recommended actions by midnight of the next day:  No  Name of provider notified for urgent issues identified:   Provider Method of Notification:     Pharmacist comments:   Time spent performing this drug regimen review (minutes):  15   Larraine Brazier,  PharmD Clinical Pharmacist 06/23/2024  1:01 PM **Pharmacist phone directory can now be found on [email or web address] ([email or web address]).  Listed under [facility].

## 2024-06-23 NOTE — Evaluation (Signed)
 Physical Therapy Assessment and Plan  Patient Details  Name: Betty Golden MRN: [id number] Date of Birth: 18-May-1946  PT Diagnosis: Abnormal posture, Abnormality of gait, Hemiplegia non-dominant, Impaired cognition, and Muscle weakness Rehab Potential: Good ELOS: 7-9 days   Today's Date: 06/23/2024 PT Individual Time: 1300-1415 PT Individual Time Calculation (min): 75 min    Hospital Problem: Principal Problem:   Cardioembolic stroke [facility])   Past Medical History:  Past Medical History:  Diagnosis Date   Acute ischemic left middle cerebral artery (MCA) stroke (HCC) 04/17/2024   Asthma    C. difficile colitis    Cerebrovascular accident (CVA) due to embolism of precerebral artery (HCC) 05/25/2024   Cervical stenosis of spine    Chronic headaches    Chronic neck pain    Colitis due to Clostridium difficile 02/10/2015   Coronary artery disease    Depression    Diverticulosis    Essential hypertension    Fibromyalgia    GERD (gastroesophageal reflux disease)    History of CVA (cerebrovascular accident) 05/06/2024   History of Salmonella gastroenteritis    HNP (herniated nucleus pulposus), lumbar    Hyperlipidemia    IBS (irritable bowel syndrome)    Lung nodule    rheumatoid arthritis    Sepsis (HCC) 05/22/2024   Sepsis due to pneumonia (HCC) 05/06/2024   Past Surgical History:  Past Surgical History:  Procedure Laterality Date   APPENDECTOMY  1965   BIOPSY  08/26/2018   Procedure: BIOPSY;  Surgeon: Golda Claudis PENNER, MD;  Location: [facility];  Service: Endoscopy;;  gastric   BREAST BIOPSY     BREAST REDUCTION SURGERY  1994   CATARACT EXTRACTION W/PHACO Left 06/09/2023   Procedure: CATARACT EXTRACTION PHACO AND INTRAOCULAR LENS PLACEMENT (IOC);  Surgeon: Harrie Agent, MD;  Location: [facility] ORS;  Service: Ophthalmology;  Laterality: Left;  CDE 7.03   CATARACT EXTRACTION W/PHACO Right 06/23/2023   Procedure: CATARACT EXTRACTION PHACO AND INTRAOCULAR LENS PLACEMENT  (IOC);  Surgeon: Harrie Agent, MD;  Location: [facility] ORS;  Service: Ophthalmology;  Laterality: Right;  CDE: 11.55   CHOLECYSTECTOMY  1975   COLONOSCOPY N/A 08/19/2013   Procedure: COLONOSCOPY;  Surgeon: Claudis PENNER Golda, MD;  Location: [facility];  Service: Endoscopy;  Laterality: N/A;  155-moved to 140 Ann to notify pt   COLONOSCOPY N/A 08/26/2018   Procedure: COLONOSCOPY;  Surgeon: Golda Claudis PENNER, MD;  Location: [facility];  Service: Endoscopy;  Laterality: N/A;   ESOPHAGOGASTRODUODENOSCOPY N/A 08/26/2018   Procedure: ESOPHAGOGASTRODUODENOSCOPY (EGD);  Surgeon: Golda Claudis PENNER, MD;  Location: [facility];  Service: Endoscopy;  Laterality: N/A;   fibromyalgia     LOOP RECORDER INSERTION N/A 05/26/2024   Procedure: LOOP RECORDER INSERTION;  Surgeon: Lesia Ozell Barter, PA-C;  Location: [facility] INVASIVE CV LAB;  Service: Cardiovascular;  Laterality: N/A;   LUMBAR LAMINECTOMY/DECOMPRESSION MICRODISCECTOMY Bilateral 09/20/2015   Procedure: MICRO LUMBAR DECOMPRESSION L5-S1 BILATERAL    (1 LEVEL);  Surgeon: Reyes Billing, MD;  Location: [facility]S;  Service: Orthopedics;  Laterality: Bilateral;   rheumatoid arthritis     Sinus Surgery     TONSILLECTOMY  1968   TRANSESOPHAGEAL ECHOCARDIOGRAM (CATH LAB) N/A 05/25/2024   Procedure: TRANSESOPHAGEAL ECHOCARDIOGRAM;  Surgeon: Lonni Slain, MD;  Location: [facility] INVASIVE CV LAB;  Service: Cardiovascular;  Laterality: N/A;    Assessment & Plan Clinical Impression: Betty Golden is a 78 year old right-handed female with history significant for hypertension, hyperlipidemia, 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 Presented  06/16/2024 after a fall at home after becoming dizzy and altered mental status. Reportedly she did strike the back of her neck and back. Noted to have some abdominal pain and leg pain after the fall. While in the triage, RN drew blood and family was present at the bedside when patient suddenly stopped responding to them. Code stroke was called for acute onset of aphasia. Cranial CT scan showed no acute intracranial abnormality. Chronic left parietal lobe infarct. CT cervical spine negative for acute cervical spine injury. Stable ankylosis of anterior osteophytes at C2-3 C3-4 C5-6 and C6-7 with moderate left neural foraminal stenosis C3-4 C4-5 and C5-6. CT of the chest abdomen pelvis showed no acute findings. MRI of the brain showed punctate acute infarct in the right precentral gyrus. Chronic ischemia with multiple old infarcts including evolving late subacute to chronic bilateral MCA infarcts. Admission chemistries unremarkable send glucose 126 creatinine 1.12, lactic acid 4.3-1.7, urine drug screen positive opiates. EEG negative for seizure and remained on Keppra  for seizure prophylaxis. Latest echocardiogram showed ejection fraction 70 to 75% abnormal motion on the ventricular aspect of the valve, near the severe MAC. TEE showed MV degenerative changes. Neurology follow-up currently remains on aspirin  81 mg daily and Plavix  75 mg daily for 3 months then Plavix  alone. Patient was cleared to begin Lovenox  for DVT prophylaxis. Interrogation of loop recorder showed no episodes in the past 18 days. Tolerating a regular consistency diet. Therapy evaluations completed due to patient decreased functional mobility was admitted for a comprehensive rehab program.    Patient currently requires min with mobility secondary to muscle weakness, decreased coordination and decreased motor planning, and decreased problem solving and delayed processing.  Prior to hospitalization, patient was supervision with mobility and lived  with Family, Daughter in a House home.  Home access is 5 STE, but nofamily to verify.Stairs to enter.  Patient will benefit from skilled PT intervention to maximize safe functional mobility, minimize fall risk, and decrease caregiver burden for planned discharge home with 24 hour supervision.  Anticipate patient will benefit from follow up HH at discharge.  PT - End of Session Activity Tolerance: Tolerates 10 - 20 min activity with multiple rests Endurance Deficit: Yes Endurance Deficit Description: pt very fatigued and short of breath, needing multiple rest breaks. Pt stated she was exhausted after shower and needed to lay down in bed. PT Assessment Rehab Potential (ACUTE/IP ONLY): Good PT Barriers to Discharge: Home environment access/layout PT Patient demonstrates impairments in the following area(s): Balance;Safety;Endurance;Motor PT Transfers Functional Problem(s): Bed Mobility;Bed to Chair;Car;Furniture PT Locomotion Functional Problem(s): Ambulation;Wheelchair Mobility;Stairs PT Plan PT Intensity: Minimum of 1-2 x/day ,45 to 90 minutes PT Frequency: 5 out of 7 days PT Duration Estimated Length of Stay: 7-9 days PT Treatment/Interventions: Ambulation/gait training;Community reintegration;Neuromuscular re-education;Stair training;UE/LE Strength taining/ROM;Wheelchair propulsion/positioning;UE/LE Coordination activities;Therapeutic Activities;Discharge planning;Balance/vestibular training;Functional mobility training;Patient/family education;Therapeutic Exercise PT Transfers Anticipated Outcome(s): bed mobility mod I, transfers OOB supervision PT Locomotion Anticipated Outcome(s): supervision w/ AD PT Recommendation Follow Up Recommendations: Home health PT Patient destination: Home Equipment Recommended: To be determined   PT Evaluation Precautions/Restrictions Precautions Precautions: Fall Precaution/Restrictions Comments: aphasia at baseline, watch HR Restrictions Weight  Bearing Restrictions Per Provider Order: No General   Vital SignsTherapy Vitals Temp: 97.6 F (  36.4 C) Temp Source: Oral Pulse Rate: 87 Resp: 15 BP: (!) 160/71 Patient Position (if appropriate): Lying Oxygen Therapy SpO2: 100 % O2 Device: Room Air Pain Pain Assessment Pain Scale: 0-10 Pain Score: 0-No pain Pain Interference Pain Interference Pain Effect on Sleep: 1. Rarely or not at all Pain Interference with Therapy Activities: 1. Rarely or not at all Pain Interference with Day-to-Day Activities: 1. Rarely or not at all Home Living/Prior Functioning Home Living Available Help at Discharge: Family;Available 24 hours/day Type of Home: House Home Access: Stairs to enter [employer] of Steps: 5 STE, but nofamily to verify. Entrance Stairs-Rails: Right;Left (but can't reach both rails.) Home Layout: Two level;Able to live on main level with bedroom/bathroom;Full bath on main level;Other (Comment) Alternate Level Stairs-Number of Steps: does not go upstairs or basement Bathroom Shower/Tub: Health Visitor: Standard  Lives With: Family;Daughter Prior Function Level of Independence: Requires assistive device for independence;Independent with gait;Independent with transfers  Able to Take Stairs?: Yes Driving: No Vocation: Retired Optometrist - History Ability to See in Adequate Light: 0 Adequate Perception Perception: Within Functional Limits (per observation during bathing and dressing tasks.) Praxis Praxis: WFL  Cognition Overall Cognitive Status: History of cognitive impairments - at baseline Arousal/Alertness: Awake/alert Focused Attention: Appears intact Awareness: Appears intact Safety/Judgment: Appears intact Sensation Sensation Light Touch: Appears Intact Hot/Cold: Appears Intact Proprioception: Appears Intact Coordination Gross Motor Movements are Fluid and Coordinated: No Motor  Motor Motor: Hemiplegia Motor -  Skilled Clinical Observations: general motor weakness   Trunk/Postural Assessment  Cervical Assessment Cervical Assessment: Within Functional Limits Thoracic Assessment Thoracic Assessment: Exceptions to [facility] (rounded shoulders.) Lumbar Assessment Lumbar Assessment: Exceptions to [facility] (posterior pelvic tilt.) Postural Control Postural Control: Deficits on evaluation Protective Responses: decreased  Balance Balance Balance Assessed: Yes Static Sitting Balance Static Sitting - Balance Support: Feet supported Static Sitting - Level of Assistance: 7: Independent Dynamic Sitting Balance Dynamic Sitting - Balance Support: Feet supported Dynamic Sitting - Level of Assistance: 5: Stand by assistance Static Standing Balance Static Standing - Balance Support: Bilateral upper extremity supported Static Standing - Level of Assistance: 5: Stand by assistance;4: Min assist Dynamic Standing Balance Dynamic Standing - Balance Support: Bilateral upper extremity supported Dynamic Standing - Level of Assistance: 4: Min assist Extremity Assessment  RUE Assessment RUE Assessment: Within Functional Limits LUE Assessment LUE Assessment: Within Functional Limits RLE Assessment RLE Assessment: Within Functional Limits General Strength Comments: grossly at least 4+/5, except pain w/ hip flexor testing LLE Assessment LLE Assessment: Exceptions to [facility] General Strength Comments: grossly 4-/5  Care Tool Care Tool Bed Mobility Roll left and right activity   Roll left and right assist level: Contact Guard/Touching assist    Sit to lying activity   Sit to lying assist level: Contact Guard/Touching assist    Lying to sitting on side of bed activity   Lying to sitting on side of bed assist level: the ability to move from lying on the back to sitting on the side of the bed with no back support.: Contact Guard/Touching assist     Care Tool Transfers Sit to stand transfer   Sit to stand assist level:  Contact Guard/Touching assist    Chair/bed transfer   Chair/bed transfer assist level: Contact Guard/Touching assist    Car transfer   Car transfer assist level: Contact Guard/Touching assist      Care Tool Locomotion Ambulation   Assist level: Contact Guard/Touching assist Assistive device: Walker-rolling Max distance: 48  Walk  10 feet activity   Assist level: Contact Guard/Touching assist Assistive device: Walker-rolling   Walk 50 feet with 2 turns activity Walk 50 feet with 2 turns activity did not occur: Safety/medical concerns      Walk 150 feet activity Walk 150 feet activity did not occur: Safety/medical concerns      Walk 10 feet on uneven surfaces activity   Assist level: Minimal Assistance - Patient > 75% Assistive device: Walker-rolling  Stairs   Assist level: Minimal Assistance - Patient > 75% Stairs assistive device: 2 hand rails Max number of stairs: 8  Walk up/down 1 step activity   Walk up/down 1 step (curb) assist level: Minimal Assistance - Patient > 75% Walk up/down 1 step or curb assistive device: 2 hand rails  Walk up/down 4 steps activity   Walk up/down 4 steps assist level: Minimal Assistance - Patient > 75% Walk up/down 4 steps assistive device: 2 hand rails  Walk up/down 12 steps activity Walk up/down 12 steps activity did not occur: Safety/medical concerns      Pick up small objects from floor Pick up small object from the floor (from standing position) activity did not occur: Safety/medical concerns      Wheelchair Is the patient using a wheelchair?: Yes Type of Wheelchair: Manual   Wheelchair assist level: Supervision/Verbal cueing Max wheelchair distance: 150  Wheel 50 feet with 2 turns activity   Assist Level: Supervision/Verbal cueing  Wheel 150 feet activity   Assist Level: Supervision/Verbal cueing    Refer to Care Plan for Long Term Goals  SHORT TERM GOAL WEEK 1 PT Short Term Goal 1 (Week 1): STG+ LTG 2/2  ELOS  Recommendations for other services: None   Skilled Therapeutic Intervention Evaluation completed (see details above and below) with education on PT POC and goals and individual treatment initiated with focus on  endurance, strengthening, transfers, gait.  Pt presents in long sitting on bed and agreeable to therapy.  Pt visiting w/ family.  Pt performed rolling w/ CGA and use of siderails.  Pt transferred sup to sit w/ CGA, although attempts from flat bed w/o rolling unable to accomplish.  Cues given for rolling and then able to complete.  Pt transfers sit to stand w/ CGA and performs squat pivot w/ CGA.  Pt able to negotiate w/c x 150' in hallways w/ occasional min A to avodi obstacles w/ turning corners.  Pt amb x 10' to car height simulation w/ CGA, cues for upright posture and then cues to sit and bring legs into car.  Pt negotiated ramped surface w/ min A for descending.  Pt negotiated 8 steps w/ 2 rails and min A, cues for sequencing for step-to pattern and for improving BOS.  Pt amb x 48' w/ RW and CGA, but flexed trunk unless cued and for walker management.  Pt amb x 15' w/ RW to bed and transferred sit to supine w/ CGA/supervision.  Pt remained supine in bed w/ bed alarm on and all needs in reach.        Mobility Bed Mobility Bed Mobility: Rolling Right;Rolling Left;Sit to Supine;Left Sidelying to Sit Rolling Right: Contact Guard/Touching assist Rolling Left: Contact Guard/Touching assist Left Sidelying to Sit: Contact Guard/Touching assist Sit to Supine: Contact Guard/Touching assist Transfers Transfers: Sit to Stand;Stand to Sit;Stand Pivot Transfers Sit to Stand: Contact Guard/Touching assist Stand to Sit: Contact Guard/Touching assist Stand Pivot Transfers: Contact Guard/Touching assist Transfer (Assistive device): None Locomotion  Gait Ambulation: Yes Gait Assistance: Contact Guard/Touching  assist Gait Distance (Feet): 48 Feet Assistive device: Rolling  walker Gait Gait: Yes Gait Pattern: Decreased step length - right;Decreased step length - left;Step-to pattern;Step-through pattern;Narrow base of support;Trunk flexed Gait velocity: Decreased Stairs / Additional Locomotion Stairs: Yes Stairs Assistance: Minimal Assistance - Patient > 75% Stair Management Technique: Two rails Number of Stairs: 8 Height of Stairs: 6 Ramp: Minimal Assistance - Patient >75% Curb: Minimal Assistance - Patient >75% Naval Architect Mobility: Yes Wheelchair Assistance: Doctor, General Practice: Both upper extremities Wheelchair Parts Management: Needs assistance Distance: 150   Discharge Criteria: Patient will be discharged from PT if patient refuses treatment 3 consecutive times without medical reason, if treatment goals not met, if there is a change in medical status, if patient makes no progress towards goals or if patient is discharged from hospital.  The above assessment, treatment plan, treatment alternatives and goals were discussed and mutually agreed upon: by patient  Reyes SHAUNNA Sierra 06/23/2024, 2:31 PM

## 2024-06-23 NOTE — Patient Care Conference (Signed)
 Inpatient RehabilitationTeam Conference and Plan of Care Update Date: 06/23/2024   Time: 10:21 AM    Patient Name: Betty Golden      Medical Record Number: [id number]  Date of Birth: 1946/02/24 Sex: Female         Room/Bed: 4W25C/4W25C-01 Payor Info: Payor: [employer]ICARE / Plan: BCBS MEDICARE / Product Type: *No Product type* /    Admit Date/Time:  06/22/2024  2:10 PM  Primary Diagnosis:  Cardioembolic stroke [facility])  Hospital Problems: Principal Problem:   Cardioembolic stroke [facility])    Expected Discharge Date: Expected Discharge Date:  (evals pending)  Team Members Present: Physician leading conference: Dr. Prentice Compton Social Worker Present: Rhoda Clement, LCSW Nurse Present: Barnie Ronde, RN PT Present: Delinda Bertrand, PTA;Sherlean Perks, PT OT Present: Julia Saguier, OT SLP Present: Blaise Alderman, SLP PPS Coordinator present : Eleanor Colon, SLP     Current Status/Progress Goal Weekly Team Focus  Bowel/Bladder   Patient is continent. Last BM 11/19. Senna given.   To remain continent of both. Prevent constipation.   Patient to have a bowel movement within 3 days.    Swallow/Nutrition/ Hydration               ADL's      Evals pending          Mobility   Evaluation Pending           Communication                Safety/Cognition/ Behavioral Observations               Pain   Complains of generalized pain but more so in her head.   To be pain free.   Assess pain q shift and prn.    Skin   Callouses on left heel and botton of left foot., bruising to bilateral lower extremity.   No skin breakdown.  Assess skin q shift and prn.      Discharge Planning:  HOme with daughter and daughter's family-daughter is retired and can provide supervision level. Has DME from previous admissions   Team Discussion: Patient admitted post right gyrus infarct with history of previous CVA, aphasia and mild apraxia.  Having  trouble swallowing with severe cervical osteophytes; frequently clearing of throat.   Patient on target to meet rehab goals: yes, she is doing great, very close to her baseline leaving here last October 2025. Needs CGA walking with RW, close S with bathing and UB dressing, light CGA LB dressing. BUT she gets extremely fatigued with heavy breathing. Based on her PLOF and how her fibromyalgia was exacerbated last rehab stay, I am suggesting 15/7 just so we do not over tax her.   *See Care Plan and progress notes for long and short-term goals.   Revisions to Treatment Plan:  Change to 15/7 therapy schedule   Teaching Needs: Safety, medications, transfers, toileting, etc.   Current Barriers to Discharge: Decreased caregiver support  Possible Resolutions to Barriers: Family education     Medical Summary Current Status: aphasia since CVA in Sept  Barriers to Discharge: Other (comments)  Barriers to Discharge Comments: apraxia and aphasia         I attest that I was present, lead the team conference, and concur with the assessment and plan of the team.   Ronde Barnie NOVAK 06/23/2024, 12:59 PM

## 2024-06-23 NOTE — Progress Notes (Signed)
 Occupational Therapy Note  Patient Details  Name: Betty Golden MRN: [id number] Date of Birth: 09/16/1945  Occupational Therapist participated in the interdisciplinary team conference, providing clinical information regarding the patient's current status, treatment goals, and weekly focus, including any barriers that need to be addressed. Please see the Inpatient Rehabilitation Team Conference and Plan of Care Update for further details.       Nicolina Hirt 06/23/2024, 10:30 AM

## 2024-06-23 NOTE — Progress Notes (Signed)
 Patient ID: Betty Golden, female   DOB: 01-26-1946, 78 y.o.   MRN: [id number] Met with the patient to review current medical situation post CVA right gyrus with history of CVA/aphasia. Reviewed secondary risk management including HTN, HLD on Crestor  and DOAC x 3 months then Plavix  solo. Continue to follow along to address educational needs to facilitate preparation for discharge. [name]

## 2024-06-23 NOTE — Progress Notes (Signed)
 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 Stool noted within the rectum. SOFT TISSUES: No opaque urinary calculi. BONES: No acute osseous abnormality. Multilevel degenerative changes of the spine. Moderate degenerative changes of the right hip. IMPRESSION: 1. Moderate colonic and rectal stool burden without obstructive pattern Electronically signed by: Luke Bun MD 06/22/2024 08:47 PM EST RP Workstation: [id number]   Recent Labs    06/22/24 1821 06/23/24 0443  WBC 10.0 8.1  HGB 11.5* 10.4*  HCT 34.9* 32.1*  PLT 239 204   Recent Labs    06/22/24 1821 06/23/24 0443  NA  --  142  K  --  3.6  CL  --  105  CO2  --  28  GLUCOSE  --  94  BUN  --  10  CREATININE 0.89 0.84  CALCIUM   --  9.4    Intake/Output Summary (Last 24 hours) at 06/23/2024 0905 Last data filed at 06/23/2024 9171 Gross per 24 hour  Intake 450 ml  Output --  Net 450 ml        Physical Exam: Vital Signs Blood pressure (!) 146/73, pulse 83, temperature 97.8 F (36.6 C), temperature source Oral, resp. rate 15, height 5' 4 (1.626 m), weight 57.9 kg, SpO2 95%.   General: No acute distress Mood and affect are appropriate Heart: Regular rate and rhythm no rubs murmurs or extra sounds Lungs: Clear to auscultation, breathing unlabored, no rales or wheezes Abdomen: Positive bowel sounds, soft nontender to palpation, nondistended Extremities: No clubbing, cyanosis, or edema Skin: No evidence of  breakdown, no evidence of rash Neurologic: Cranial nerves II through XII intact, motor strength is 4+/5 in bilateral deltoid, bicep, tricep, grip, hip flexor, knee extensors, ankle dorsiflexor and plantar flexor Sensory exam normal sensation to light touch and proprioception in bilateral upper and lower extremities Cerebellar exam normal finger to nose to finger as well as heel to shin in bilateral upper and lower extremities Finger to thumb intact  Musculoskeletal: Full range of motion in all 4 extremities. No joint swelling Unable to name stethoscope but can name simple objects, Pen, Watch , Glasses, comprehension for simple info is good   Assessment/Plan: 1. Functional deficits which require 3+ hours per day of interdisciplinary therapy in a comprehensive inpatient rehab setting. Physiatrist is providing close team supervision and 24 hour management of active medical problems listed below. Physiatrist and rehab team continue to assess barriers to discharge/monitor patient progress toward functional and medical goals  Care Tool:  Bathing              Bathing assist       Upper Body Dressing/Undressing Upper body dressing        Upper body assist  Lower Body Dressing/Undressing Lower body dressing            Lower body assist       Toileting Toileting    Toileting assist       Transfers Chair/bed transfer  Transfers assist           Locomotion Ambulation   Ambulation assist              Walk 10 feet activity   Assist           Walk 50 feet activity   Assist           Walk 150 feet activity   Assist           Walk 10 feet on uneven surface  activity   Assist           Wheelchair     Assist               Wheelchair 50 feet with 2 turns activity    Assist            Wheelchair 150 feet activity     Assist          Blood pressure (!) 146/73, pulse 83, temperature 97.8 F (36.6  C), temperature source Oral, resp. rate 15, height 5' 4 (1.626 m), weight 57.9 kg, SpO2 95%.  Medical Problem List and Plan: 1. Functional deficits secondary to right precentral gyrus infarction in the setting of other subacute/chronic, likely cardioembolic infarct with history of Left MCA CVA 04/12/2024 (at [facility] until 10/1), s/p left cerebellar infarct 05/22/2024  status post loop recorder received CIR             -patient may  shower             -ELOS/Goals: 10-14 days supervision, but will need close Sup or even CGA, Berg was low even after CVA in Sept , now has had 2 more CVAs              Admit to CIR- evals today  2.  Antithrombotics: -DVT/anticoagulation:  Pharmaceutical: Lovenox              -antiplatelet therapy: Aspirin  81 mg daily and Plavix  75 mg daily x 3 months then Plavix  alone 3. Pain Management/fibromyalgia: Cymbalta  30 mg twice daily, oxycodone  5 mg every 6 hours as needed 4. Mood/Behavior/Sleep: Trazodone  25 mg nightly, Klonopin  0.25 mg daily as needed anxiety, melatonin 5 mg nightly as needed sleep             -antipsychotic agents: N/A 5. Neuropsych/cognition: This patient is not capable of making decisions on her own behalf.  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 Skin/Wound Care: Routine skin checks 7. Fluids/Electrolytes/Nutrition: Routine in and outs with follow-up chemistries 8.  Seizure prophylaxis.  Keppra  500 mg twice daily.  EEG negative 9.  COPD/asthma.  Quit smoking 35 years ago.  Continue inhalers as directed 10.  Hyperlipidemia.  Crestor  11.  Rheumatoid arthritis.Arava  20 mg daily 12.  GERD.  Protonix  13. Poor appetite- not eating per pt 14. Constipation?- LBM before this admission- KUB moderate stool burden  Had BM last noc, cont Senna S     LOS: 1 days A FACE TO FACE EVALUATION WAS PERFORMED  Betty Golden 06/23/2024, 9:05 AM

## 2024-06-23 NOTE — Evaluation (Signed)
 Occupational Therapy Assessment and Plan  Patient Details  Name: KYLIEANN EAGLES MRN: [id number] Date of Birth: [birth year]  OT Diagnosis: muscle weakness (generalized) Rehab Potential: Rehab Potential (ACUTE ONLY): Good ELOS: 7-9 days   Today's Date: 06/23/2024 OT Individual Time: 1100-1200 OT Individual Time Calculation (min): 60 min     Pt seen for initial evaluation and ADL training with a focus on activity tolerance and balance.  Pt stated she has only been sponge bathing at home as her home bathroom is too crowded?  Will need to talk with daughter as I recall she had a shower chair for home on her last admission.  And the plan was for her to shower at home.  Pt very agreeable to trying a shower and getting her hair washed. Pt got out of bed with S, stood to RW with close S then ambulated to toilet CGA. Pt urinated using the toilet with S.  Stepped in shower and bathed on bench and stood using bar to wash her bottom with S.   Returned to toilet to void 2nd time and then to recliner to dress.  Pt short of breath needing frequent rest breaks.   She donned top and underwear but then was clearly fatigued so needed A to don pants over feet, she stood to pull over hips.  Pt stating she was extremely tired, continuing to breath heavily, moaning with effort.  Pt stated I need the bed! Transferred to bed with S and scooted back into bed with no A.   Reviewed role of OT, discussed POC and pt's goals, and ELOS.  Pt has expressive aphasia and some receptive, so communication can be challenging at times. She is verbal and uses a word salad but does well with pointing to things and does well with visual demonstration. Pt resting in bed With all needs met and bed alarm set.     Hospital Problem: Principal Problem:   Cardioembolic stroke [facility])   Past Medical History:  Past Medical History:  Diagnosis Date   Acute ischemic left middle cerebral artery (MCA) stroke (HCC) 04/17/2024   Asthma    C.  difficile colitis    Cerebrovascular accident (CVA) due to embolism of precerebral artery (HCC) 05/25/2024   Cervical stenosis of spine    Chronic headaches    Chronic neck pain    Colitis due to Clostridium difficile 02/10/2015   Coronary artery disease    Depression    Diverticulosis    Essential hypertension    Fibromyalgia    GERD (gastroesophageal reflux disease)    History of CVA (cerebrovascular accident) 05/06/2024   History of Salmonella gastroenteritis    HNP (herniated nucleus pulposus), lumbar    Hyperlipidemia    IBS (irritable bowel syndrome)    Lung nodule    rheumatoid arthritis    Sepsis (HCC) 05/22/2024   Sepsis due to pneumonia (HCC) 05/06/2024   Past Surgical History:  Past Surgical History:  Procedure Laterality Date   APPENDECTOMY  1965   BIOPSY  08/26/2018   Procedure: BIOPSY;  Surgeon: Golda Claudis PENNER, MD;  [facility];  Service: Endoscopy;;  gastric   BREAST BIOPSY     BREAST REDUCTION SURGERY  1994   CATARACT EXTRACTION W/PHACO Left 06/09/2023   Procedure: CATARACT EXTRACTION PHACO AND INTRAOCULAR LENS PLACEMENT (IOC);  Surgeon: Harrie Agent, MD;  Location: [facility];  Service: Ophthalmology;  Laterality: Left;  CDE 7.03   CATARACT EXTRACTION W/PHACO Right 06/23/2023   Procedure: CATARACT EXTRACTION  PHACO AND INTRAOCULAR LENS PLACEMENT (IOC);  Surgeon: Harrie Agent, MD;  Location: [facility];  Service: Ophthalmology;  Laterality: Right;  CDE: 11.55   CHOLECYSTECTOMY  1975   COLONOSCOPY N/A 08/19/2013   Procedure: COLONOSCOPY;  Surgeon: Claudis RAYMOND Rivet, MD;  [facility];  Service: Endoscopy;  Laterality: N/A;  155-moved to 140 Ann to notify pt   COLONOSCOPY N/A 08/26/2018   Procedure: COLONOSCOPY;  Surgeon: Rivet Claudis RAYMOND, MD;  [facility];  Service: Endoscopy;  Laterality: N/A;   ESOPHAGOGASTRODUODENOSCOPY N/A 08/26/2018   Procedure: ESOPHAGOGASTRODUODENOSCOPY (EGD);  Surgeon: Rivet Claudis RAYMOND, MD;  [facility];  Service: Endoscopy;  Laterality: N/A;   fibromyalgia     LOOP RECORDER INSERTION N/A 05/26/2024   Procedure: LOOP RECORDER INSERTION;  Surgeon: Lesia Ozell Barter, PA-C;  Location: [facility] INVASIVE CV LAB;  Service: Cardiovascular;  Laterality: N/A;   LUMBAR LAMINECTOMY/DECOMPRESSION MICRODISCECTOMY Bilateral 09/20/2015   Procedure: MICRO LUMBAR DECOMPRESSION L5-S1 BILATERAL    (1 LEVEL);  Surgeon: Reyes Billing, MD;  Location: [facility];  Service: Orthopedics;  Laterality: Bilateral;   rheumatoid arthritis     Sinus Surgery     TONSILLECTOMY  1968   TRANSESOPHAGEAL ECHOCARDIOGRAM (CATH LAB) N/A 05/25/2024   Procedure: TRANSESOPHAGEAL ECHOCARDIOGRAM;  Surgeon: Lonni Slain, MD;  Location: [facility] INVASIVE CV LAB;  Service: Cardiovascular;  Laterality: N/A;    Assessment & Plan Clinical Impression: KEMONIE CUTILLO is a 78 year old right-handed female with history significant for hypertension, hyperlipidemia, 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 Presented 06/16/2024 after a fall at home after becoming dizzy and altered mental status. Reportedly she did strike the back of her neck and back. Noted to have some abdominal pain and leg pain after the fall. While in the triage, RN drew blood and family was present at the bedside when patient suddenly stopped responding to them. Code stroke was called for acute onset of aphasia. Cranial CT scan showed no acute intracranial abnormality. Chronic left parietal lobe infarct. CT cervical spine negative for acute cervical spine injury. Stable ankylosis of anterior osteophytes at C2-3 C3-4 C5-6 and C6-7 with moderate left neural  foraminal stenosis C3-4 C4-5 and C5-6. CT of the chest abdomen pelvis showed no acute findings. MRI of the brain showed punctate acute infarct in the right precentral gyrus. Chronic ischemia with multiple old infarcts including evolving late subacute to chronic bilateral MCA infarcts. Admission chemistries unremarkable send glucose 126 creatinine 1.12, lactic acid 4.3-1.7, urine drug screen positive opiates. EEG negative for seizure and remained on Keppra  for seizure prophylaxis. Latest echocardiogram showed ejection fraction 70 to 75% abnormal motion on the ventricular aspect of the valve, near the severe MAC. TEE showed MV degenerative changes. Neurology follow-up currently remains on aspirin  81 mg daily and Plavix  75 mg daily for 3 months then Plavix  alone. Patient was cleared to begin Lovenox  for DVT prophylaxis. Interrogation of loop recorder showed no episodes in the past 18 days. Tolerating a regular consistency diet. Therapy evaluations completed due to patient decreased functional mobility was admitted for a comprehensive rehab program. .  Patient transferred to CIR on 06/22/2024 .    Patient currently requires min - CGA with basic self-care skills secondary to muscle weakness,  decreased cardiorespiratoy endurance, and decreased standing balance and decreased balance strategies.  Prior to hospitalization, patient's daughter assisted her with bathing and dressing.    Patient will benefit from skilled intervention to increase independence with basic self-care skills prior to discharge home with care partner.  Anticipate patient will require intermittent supervision and follow up home health.  OT - End of Session Activity Tolerance: Tolerates 10 - 20 min activity with multiple rests Endurance Deficit: Yes Endurance Deficit Description: pt very fatigued and short of breath, needing multiple rest breaks. Pt stated she was exhausted after shower and needed to lay down in bed. OT Assessment Rehab  Potential (ACUTE ONLY): Good OT Barriers to Discharge: None OT Patient demonstrates impairments in the following area(s): Endurance;Balance;Motor OT Basic ADL's Functional Problem(s): Dressing;Toileting OT Advanced ADL's Functional Problem(s): None OT Transfers Functional Problem(s): Toilet;Tub/Shower OT Additional Impairment(s): None OT Plan OT Intensity: Minimum of 1-2 x/day, 45 to 90 minutes OT Frequency: Total of 15 hours over 7 days of combined therapies (15/7 based on low endurance and fibromyalgia) OT Duration/Estimated Length of Stay: 7-9 days OT Treatment/Interventions: Balance/vestibular training;Discharge planning;Functional mobility training;Neuromuscular re-education;Patient/family education;Psychosocial support;DME/adaptive equipment instruction;Self Care/advanced ADL retraining;Therapeutic Activities;Therapeutic Exercise;UE/LE Strength taining/ROM OT Self Feeding Anticipated Outcome(s): no goal, pt is set up to independent OT Basic Self-Care Anticipated Outcome(s): supervision with lower body dressing, at goal level for bathing OT Toileting Anticipated Outcome(s): mod independent OT Bathroom Transfers Anticipated Outcome(s): supervision OT Recommendation Recommendations for Other Services: None Patient destination: Home Follow Up Recommendations: Home health OT Equipment Recommended: None recommended by OT Equipment Details: pt received DME at last admission   OT Evaluation Precautions/Restrictions  Precautions Precautions: Fall Precaution/Restrictions Comments: aphasia at baseline, watch HR Restrictions Weight Bearing Restrictions Per Provider Order: No General   Vital Signs   Pain Pain Assessment Pain Score: 0-No pain Home Living/Prior Functioning Home Living Family/patient expects to be discharged to:: Private residence Living Arrangements: Children, Other relatives Available Help at Discharge: Family, Available 24 hours/day Type of Home: House Home  Access: Stairs to enter [employer] of Steps: 1 stair to the garage with 1/2 rise to entrance is her best option opposed to front entrance Entrance Stairs-Rails: Right, Left Home Layout: Two level, Able to live on main level with bedroom/bathroom, Full bath on main level, Other (Comment) Alternate Level Stairs-Number of Steps: does not go upstairs or basement Bathroom Shower/Tub: Health Visitor: Standard  Lives With: Family, Daughter IADL History Occupation: Retired Prior Function Level of Independence: Needs assistance with ADLs, Requires assistive device for independence, Independent with gait, Independent with transfers  Able to Take Stairs?: Yes (with Assist) Driving: No Vocation: Retired Administrator, Sports Baseline Vision/History: 1 Wears glasses Ability to See in Adequate Light: 0 Adequate Patient Visual Report: No change from baseline Vision Assessment?: No apparent visual deficits Perception  Perception: Within Functional Limits (per observation during bathing and dressing tasks.) Praxis Praxis: WFL Cognition Cognition Overall Cognitive Status: History of cognitive impairments - at baseline Arousal/Alertness: Awake/alert Memory: Appears intact Attention: Focused;Sustained Focused Attention: Appears intact Sustained Attention: Appears intact Awareness: Appears intact Problem Solving: Appears intact Brief Interview for Mental Status (BIMS) Repetition of Three Words (First Attempt): 3 Temporal Orientation: Year: Correct Temporal Orientation: Month: Accurate within 5 days Temporal Orientation: Day: Incorrect Recall: Sock: No, could not recall Recall: Blue: Yes, no cue required Recall: Bed: No, could not recall BIMS Summary Score: 10 Sensation Sensation Light Touch: Appears Intact Hot/Cold: Appears Intact Proprioception: Appears Intact Motor  Motor Motor - Skilled Clinical  Observations: general motor weakness  Trunk/Postural Assessment   Postural Control Postural Control: Deficits on evaluation (pt has curved spine - more apparent during standing tasks)  Balance Static Sitting Balance Static Sitting - Level of Assistance: 7: Independent Dynamic Sitting Balance Dynamic Sitting - Level of Assistance: 5: Stand by assistance Static Standing Balance Static Standing - Level of Assistance: 5: Stand by assistance Dynamic Standing Balance Dynamic Standing - Level of Assistance: 4: Min assist Extremity/Trunk Assessment RUE Assessment RUE Assessment: Within Functional Limits LUE Assessment LUE Assessment: Within Functional Limits  Care Tool Care Tool Self Care Eating   Eating Assist Level: Set up assist    Oral Care    Oral Care Assist Level: Set up assist    Bathing   Body parts bathed by patient: Right arm;Left arm;Chest;Abdomen;Front perineal area;Buttocks;Face;Left lower leg;Right lower leg;Left upper leg;Right upper leg     Assist Level: Supervision/Verbal cueing    Upper Body Dressing(including orthotics)   What is the patient wearing?: Pull over shirt   Assist Level: Supervision/Verbal cueing    Lower Body Dressing (excluding footwear)   What is the patient wearing?: Underwear/pull up;Pants Assist for lower body dressing: Contact Guard/Touching assist    Putting on/Taking off footwear   What is the patient wearing?: Non-skid slipper socks Assist for footwear: Minimal Assistance - Patient > 75%       Care Tool Toileting Toileting activity   Assist for toileting: Contact Guard/Touching assist     Care Tool Bed Mobility Roll left and right activity   Roll left and right assist level: Supervision/Verbal cueing    Sit to lying activity   Sit to lying assist level: Supervision/Verbal cueing    Lying to sitting on side of bed activity   Lying to sitting on side of bed assist level: the ability to move from lying on the back to sitting on the side of the bed with no back support.: Supervision/Verbal  cueing     Care Tool Transfers Sit to stand transfer   Sit to stand assist level: Contact Guard/Touching assist    Chair/bed transfer   Chair/bed transfer assist level: Contact Guard/Touching assist     Toilet transfer   Assist Level: Contact Guard/Touching assist     Care Tool Cognition  Expression of Ideas and Wants Expression of Ideas and Wants: 2. Frequent difficulty - frequently exhibits difficulty with expressing needs and ideas  Understanding Verbal and Non-Verbal Content Understanding Verbal and Non-Verbal Content: 3. Usually understands - understands most conversations, but misses some part/intent of message. Requires cues at times to understand   Memory/Recall Ability Memory/Recall Ability : Current season;Location of own room;That he or she is in a hospital/hospital unit   Refer to Care Plan for Long Term Goals  SHORT TERM GOAL WEEK 1 OT Short Term Goal 1 (Week 1): STGs = LTGs  Recommendations for other services: None    Skilled Therapeutic Intervention ADL ADL Eating: Set up Grooming: Setup Upper Body Bathing: Supervision/safety Where Assessed-Upper Body Bathing: Shower Lower Body Bathing: Supervision/safety Where Assessed-Lower Body Bathing: Shower Upper Body Dressing: Supervision/safety Where Assessed-Upper Body Dressing: Chair Lower Body Dressing: Minimal assistance (pt able to don underwear and socks but became fatigued and needed A with getting pants over feet, pt able to pull over hips) Where Assessed-Lower Body Dressing: Chair Toileting: Supervision/safety Where Assessed-Toileting: Teacher, Adult Education: Furniture Conservator/restorer Method: Proofreader: Acupuncturist: Administrator, Arts Method: Designer, Industrial/product: Personal Assistant  tub bench;Grab bars Mobility  Transfers Sit to Stand: Supervision/Verbal cueing Stand to Sit: Supervision/Verbal cueing   Discharge Criteria:  Patient will be discharged from OT if patient refuses treatment 3 consecutive times without medical reason, if treatment goals not met, if there is a change in medical status, if patient makes no progress towards goals or if patient is discharged from hospital.  The above assessment, treatment plan, treatment alternatives and goals were discussed and mutually agreed upon: by patient  Enslee Bibbins 06/23/2024, 1:12 PM

## 2024-06-23 NOTE — Progress Notes (Signed)
 Inpatient Rehabilitation Care Coordinator Assessment and Plan Patient Details  Name: Betty Golden MRN: [id number] Date of Birth: March 04, 1946  Today's Date: 06/23/2024  Hospital Problems: Principal Problem:   Cardioembolic stroke [facility])  Past Medical History:  Past Medical History:  Diagnosis Date   Acute ischemic left middle cerebral artery (MCA) stroke (HCC) 04/17/2024   Asthma    C. difficile colitis    Cerebrovascular accident (CVA) due to embolism of precerebral artery (HCC) 05/25/2024   Cervical stenosis of spine    Chronic headaches    Chronic neck pain    Colitis due to Clostridium difficile 02/10/2015   Coronary artery disease    Depression    Diverticulosis    Essential hypertension    Fibromyalgia    GERD (gastroesophageal reflux disease)    History of CVA (cerebrovascular accident) 05/06/2024   History of Salmonella gastroenteritis    HNP (herniated nucleus pulposus), lumbar    Hyperlipidemia    IBS (irritable bowel syndrome)    Lung nodule    rheumatoid arthritis    Sepsis (HCC) 05/22/2024   Sepsis due to pneumonia (HCC) 05/06/2024   Past Surgical History:  Past Surgical History:  Procedure Laterality Date   APPENDECTOMY  1965   BIOPSY  08/26/2018   Procedure: BIOPSY;  Surgeon: Golda Claudis PENNER, MD;  [facility];  Service: Endoscopy;;  gastric   BREAST BIOPSY     BREAST REDUCTION SURGERY  1994   CATARACT EXTRACTION W/PHACO Left 06/09/2023   Procedure: CATARACT EXTRACTION PHACO AND INTRAOCULAR LENS PLACEMENT (IOC);  Surgeon: Harrie Agent, MD;  Location: [facility];  Service: Ophthalmology;  Laterality: Left;  CDE 7.03   CATARACT EXTRACTION W/PHACO Right 06/23/2023   Procedure: CATARACT EXTRACTION PHACO AND INTRAOCULAR LENS PLACEMENT (IOC);  Surgeon: Harrie Agent, MD;  Location: [facility];  Service: Ophthalmology;  Laterality: Right;  CDE: 11.55   CHOLECYSTECTOMY  1975   COLONOSCOPY N/A 08/19/2013   Procedure: COLONOSCOPY;  Surgeon: Claudis PENNER Golda, MD;  [facility];  Service: Endoscopy;  Laterality: N/A;  155-moved to 140 Ann to notify pt   COLONOSCOPY N/A 08/26/2018   Procedure: COLONOSCOPY;  Surgeon: Golda Claudis PENNER, MD;  [facility];  Service: Endoscopy;  Laterality: N/A;   ESOPHAGOGASTRODUODENOSCOPY N/A 08/26/2018   Procedure: ESOPHAGOGASTRODUODENOSCOPY (EGD);  Surgeon: Golda Claudis PENNER, MD;  [facility];  Service: Endoscopy;  Laterality: N/A;   fibromyalgia     LOOP RECORDER INSERTION N/A 05/26/2024   Procedure: LOOP RECORDER INSERTION;  Surgeon: Lesia Ozell Barter, PA-C;  Location: [facility] INVASIVE CV LAB;  Service: Cardiovascular;  Laterality: N/A;   LUMBAR LAMINECTOMY/DECOMPRESSION MICRODISCECTOMY Bilateral 09/20/2015   Procedure: MICRO LUMBAR DECOMPRESSION L5-S1 BILATERAL    (1 LEVEL);  Surgeon: Reyes Billing, MD;  Location: [facility];  Service: Orthopedics;  Laterality: Bilateral;   rheumatoid arthritis     Sinus Surgery     TONSILLECTOMY  1968   TRANSESOPHAGEAL ECHOCARDIOGRAM (CATH LAB) N/A 05/25/2024   Procedure: TRANSESOPHAGEAL ECHOCARDIOGRAM;  Surgeon: Lonni Slain, MD;  Location: [facility] INVASIVE CV LAB;  Service: Cardiovascular;  Laterality: N/A;   Social History:  reports that she quit smoking about 35 years ago. Her smoking use included cigarettes. She started smoking about 45 years ago. She has a 20 pack-year smoking history. She has never used smokeless tobacco. She reports that she does not drink alcohol and does not use drugs.  Family / Support Systems Marital Status: Widow/Widower Patient Roles: Parent Children: Nicole-daughter [phone] Other Supports:  Tyler-grandson [phone]  Leamon [phone] Anticipated Caregiver: Daughter Ability/Limitations of Caregiver: no limitations-retired Caregiver Availability: 24/7 Family Dynamics: Close with family daughter has been assisting with her ADL's prior to admission due to pt's past history of CVA's. Close with family and  friends  Social History Preferred language: English Religion: [employer] Cultural Background: NA Education: HS Health Literacy - How often do you need to have someone help you when you read instructions, pamphlets, or other written material from your doctor or pharmacy?: Sometimes Writes: Yes Employment Status: Retired Marine Scientist Issues: NA Guardian/Conservator: None-according to MD pt is not fully capable of making her own decisions due to past history of CVA's and aphasia   Abuse/Neglect Abuse/Neglect Assessment Can Be Completed: Yes Physical Abuse: Denies Verbal Abuse: Denies Sexual Abuse: Denies Exploitation of patient/patient's resources: Denies Self-Neglect: Denies  Patient response to: Social Isolation - How often do you feel lonely or isolated from those around you?: Never  Emotional Status Pt's affect, behavior and adjustment status: Pt can explain she had another storke and that is why she is here. She was doing well and mobile with a device she needed assist with ADL's that daughter assisted with. She has been in and out of the hospital the past few months with another CVA and fall Recent Psychosocial Issues: other health issues Psychiatric History: No history has seen neuro-psych in the past and has found them helpful. She may benefit again will get input from team Substance Abuse History: NA  Patient / Family Perceptions, Expectations & Goals Pt/Family understanding of illness & functional limitations: Pt and daughter can explain her stroke and deficits from this one. She and daughter have spoken with the MD and feel understand her plan moving forward. She is upbeat and feels she has made progress already. Premorbid pt/family roles/activities: mom, grandmother, retiree, etc Anticipated changes in roles/activities/participation: resume Pt/family expectations/goals: Pt states:  I'm back I had another stroke.  Community Resources [employer]:  Other (Comment) Cathie active pt) Premorbid Home Care/DME Agencies: Other (Comment) (rw, [facility], [facility], cane) Transportation available at discharge: family Is the patient able to respond to transportation needs?: Yes In the past 12 months, has lack of transportation kept you from medical appointments or from getting medications?: No In the past 12 months, has lack of transportation kept you from meetings, work, or from getting things needed for daily living?: No  Discharge Planning Living Arrangements: Children, Other relatives Support Systems: Children, Other relatives, Friends/neighbors, Church/faith community Type of Residence: Private residence Insurance Resources: Media Planner (specify) Passenger Transport Manager) Surveyor, Quantity Resources: Tree Surgeon, Family Support Financial Screen Referred: No Living Expenses: Lives with family Money Management: Family Does the patient have any problems obtaining your medications?: No Home Management: pt assists and family does also Patient/Family Preliminary Plans: Return home with daughter and daughter's family between she and son in-law and two grandson's someone is always there. Pt did well and was mobile prior to admission with a rolling walker, diud need assist with ADL's daughter assisted with. Was here in 03/2024 and knows the process of rehab Care Coordinator Barriers to Discharge: Insurance for SNF coverage Care Coordinator Anticipated Follow Up Needs: HH/OP  Clinical Impression Pt was here in September 2025 after another stroke. She was doing well at home prior to admission and hopes to get back to the level she was at prior to this stroke. Her daughter and her family are very involved and supportive. Will await team's evaluations and work on any additional discharge need.  Lacole Komorowski,  [place] MATSU 06/23/2024, 9:13 AM

## 2024-06-23 NOTE — Evaluation (Signed)
 Speech Language Pathology Assessment and Plan  Patient Details  Name: Betty Golden MRN: [id number] Date of Birth: 06-10-1946  SLP Diagnosis: Aphasia;Apraxia;Dysphagia  Rehab Potential: Good ELOS: 10-12 days    Today's Date: 06/23/2024 SLP Individual Time: 0900-1000 SLP Individual Time Calculation (min): 60 min   Hospital Problem: Principal Problem:   Cardioembolic stroke [facility])  Past Medical History:  Past Medical History:  Diagnosis Date   Acute ischemic left middle cerebral artery (MCA) stroke (HCC) 04/17/2024   Asthma    C. difficile colitis    Cerebrovascular accident (CVA) due to embolism of precerebral artery (HCC) 05/25/2024   Cervical stenosis of spine    Chronic headaches    Chronic neck pain    Colitis due to Clostridium difficile 02/10/2015   Coronary artery disease    Depression    Diverticulosis    Essential hypertension    Fibromyalgia    GERD (gastroesophageal reflux disease)    History of CVA (cerebrovascular accident) 05/06/2024   History of Salmonella gastroenteritis    HNP (herniated nucleus pulposus), lumbar    Hyperlipidemia    IBS (irritable bowel syndrome)    Lung nodule    rheumatoid arthritis    Sepsis (HCC) 05/22/2024   Sepsis due to pneumonia (HCC) 05/06/2024   Past Surgical History:  Past Surgical History:  Procedure Laterality Date   APPENDECTOMY  1965   BIOPSY  08/26/2018   Procedure: BIOPSY;  Surgeon: Golda Claudis PENNER, MD;  Location: [facility];  Service: Endoscopy;;  gastric   BREAST BIOPSY     BREAST REDUCTION SURGERY  1994   CATARACT EXTRACTION W/PHACO Left 06/09/2023   Procedure: CATARACT EXTRACTION PHACO AND INTRAOCULAR LENS PLACEMENT (IOC);  Surgeon: Harrie Agent, MD;  Location: [facility] ORS;  Service: Ophthalmology;  Laterality: Left;  CDE 7.03   CATARACT EXTRACTION W/PHACO Right 06/23/2023   Procedure: CATARACT EXTRACTION PHACO AND INTRAOCULAR LENS PLACEMENT (IOC);  Surgeon: Harrie Agent, MD;  Location: [facility];   Service: Ophthalmology;  Laterality: Right;  CDE: 11.55   CHOLECYSTECTOMY  1975   COLONOSCOPY N/A 08/19/2013   Procedure: COLONOSCOPY;  Surgeon: Claudis PENNER Golda, MD;  Location: [facility];  Service: Endoscopy;  Laterality: N/A;  155-moved to 140 Ann to notify pt   COLONOSCOPY N/A 08/26/2018   Procedure: COLONOSCOPY;  Surgeon: Golda Claudis PENNER, MD;  Location: [facility];  Service: Endoscopy;  Laterality: N/A;   ESOPHAGOGASTRODUODENOSCOPY N/A 08/26/2018   Procedure: ESOPHAGOGASTRODUODENOSCOPY (EGD);  Surgeon: Golda Claudis PENNER, MD;  Location: [facility];  Service: Endoscopy;  Laterality: N/A;   fibromyalgia     LOOP RECORDER INSERTION N/A 05/26/2024   Procedure: LOOP RECORDER INSERTION;  Surgeon: Lesia Ozell Barter, PA-C;  Location: [facility] INVASIVE CV LAB;  Service: Cardiovascular;  Laterality: N/A;   LUMBAR LAMINECTOMY/DECOMPRESSION MICRODISCECTOMY Bilateral 09/20/2015   Procedure: MICRO LUMBAR DECOMPRESSION L5-S1 BILATERAL    (1 LEVEL);  Surgeon: Reyes Billing, MD;  Location: [facility] ORS;  Service: Orthopedics;  Laterality: Bilateral;   rheumatoid arthritis     Sinus Surgery     TONSILLECTOMY  1968   TRANSESOPHAGEAL ECHOCARDIOGRAM (CATH LAB) N/A 05/25/2024   Procedure: TRANSESOPHAGEAL ECHOCARDIOGRAM;  Surgeon: Lonni Slain, MD;  Location: [facility] INVASIVE CV LAB;  Service: Cardiovascular;  Laterality: N/A;    Assessment / Plan / Recommendation Clinical Impression HPI: Betty Golden is a 78 year old right-handed female with history significant for hypertension, hyperlipidemia, CVA 05/06/2024 with residual expressive aphasia status post loop recorder insertion maintained on aspirin  81 mg daily and  Plavix  75 mg daily x 3 months and received CIR 04/17/2024 - 04/28/2024 discharge to home supervision with a rolling walker for mobility, GERD, depression, rheumatoid arthritis, COPD/asthma/quit smoking 35 years ago, spinal stenosis, fibromyalgia.  Presented 06/16/2024 after a fall at home after  becoming dizzy and altered mental status.  Reportedly she did strike the back of her neck and back.  Noted to have some abdominal pain and leg pain after the fall.     While in the triage, RN drew blood and family was present at the bedside when patient suddenly stopped responding to them.  Code stroke was called for acute onset of aphasia.  Cranial CT scan showed no acute intracranial abnormality.  Chronic left parietal lobe infarct.  CT cervical spine negative for acute cervical spine injury.  Stable ankylosis of anterior osteophytes at C2-3 C3-4 C5-6 and C6-7 with moderate left neural foraminal stenosis C3-4 C4-5 and C5-6.  CT of the chest abdomen pelvis showed no acute findings.  MRI of the brain showed punctate acute infarct in the right precentral gyrus.  Chronic ischemia with multiple old infarcts including evolving late subacute to chronic bilateral MCA infarcts.  Admission chemistries unremarkable send glucose 126 creatinine 1.12, lactic acid 4.3-1.7, urine drug screen positive opiates.     EEG negative for seizure and remained on Keppra  for seizure prophylaxis.  Latest echocardiogram showed ejection fraction 70 to 75% abnormal motion on the ventricular aspect of the valve, near the severe MAC.  TEE showed MV degenerative changes.  Neurology follow-up currently remains on aspirin  81 mg daily and Plavix  75 mg daily for 3 months then Plavix  alone.  Patient was cleared to begin Lovenox  for DVT prophylaxis.  Interrogation of loop recorder showed no episodes in the past 18 days.  Tolerating a regular consistency diet  Clinical Impression:  Bedside Swallow Evaluation: A bedside swallow evaluation was completed to assess for s/sx of oropharyngeal dysphagia. POs administered included thin liquids, puree and solids. Oral mechanism exam functional for mastication - patient is edentulous, but able to masticate solids well.  Patient with complete oral clearance. Intermittent s/sx of aspiration present with thin  liquids and puree. Questionable if due to bolus misdirection vs GERD vs pharyngeal residuals due to large anterior osteophytes. Recommend regular/thin diet with use of standardized precautions including sitting upright during PO and taking small bites/sips at a slow rate. Recommend intermittent supervision during mealtimes.  Cognitive-Linguistic: Patient was evaluated via informal measures to assess cognitive linguistic skills. Patient oriented to self, situation, location and time. Patient with seemingly functional memory and problem solving skills as she recalled recent medical hx and verbalized solutions to simple safety scenarios. Patient continues to present with moderate apraxia and mild aphasia, both expressive and receptive. Patient completed automatic speech tasks with minA and with inconsistent apraxia errors at the single word level during repetition tasks. Patient with 100% accuracy during confrontational and responsive naming, though observed occasional word finding difficulties at the conversational level. Patient answered simple yes/no questions with 100% accuracy and complex yes/no with 70% accuracy. Patient independently followed all single step commands, though accuracy decreased as steps increased. 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 Recommend targeting patients expressive/receptive language deficits during inpatient rehabiliation stay.  Pt would benefit from skilled ST services to maximize communication and dysphagia in order to maximize functional independence at d/c. Anticipate patient will require supervision at d/c and f/u SLP services.    Skilled Therapeutic  Interventions          Patient evaluated using a non-standardized cognitive linguistic assessment and bedside swallow evaluation to assess current cognitive, communicative and swallowing function. See above for details.    SLP Assessment  Patient  will need skilled Speech Lanaguage Pathology Services during CIR admission    Recommendations  SLP Diet Recommendations: Age appropriate regular solids;Thin Liquid Administration via: Cup;Straw Medication Administration: Whole meds with liquid Supervision: Intermittent supervision to cue for compensatory strategies Compensations: Minimize environmental distractions;Slow rate;Small sips/bites;Multiple dry swallows after each bite/sip Postural Changes and/or Swallow Maneuvers: Upright 30-60 min after meal;Seated upright 90 degrees Oral Care Recommendations: Oral care BID Patient destination: Home Follow up Recommendations: Home Health SLP Equipment Recommended: None recommended by SLP    SLP Frequency 3 to 5 out of 7 days   SLP Duration  SLP Intensity  SLP Treatment/Interventions 10-12 days  Minumum of 1-2 x/day, 30 to 90 minutes  Cueing hierarchy;Functional tasks;Internal/external aids;Multimodal communication approach;Patient/family education;Speech/Language facilitation;Therapeutic Activities    Pain Denies  SLP Evaluation Cognition Overall Cognitive Status: History of cognitive impairments - at baseline Arousal/Alertness: Awake/alert Orientation Level: Oriented X4 Year: 2025 Month: November Day of Week: Correct Attention: Focused;Sustained Focused Attention: Appears intact Sustained Attention: Appears intact Memory: Appears intact Awareness: Appears intact Problem Solving: Appears intact  Comprehension Auditory Comprehension Overall Auditory Comprehension: Impaired Yes/No Questions: Impaired Basic Biographical Questions: 76-100% accurate Basic Immediate Environment Questions: 75-100% accurate Complex Questions: 50-74% accurate Commands: Impaired One Step Basic Commands: 75-100% accurate Two Step Basic Commands: 50-74% accurate Conversation: Simple Interfering Components: Motor planning Expression Expression Primary Mode of Expression: Verbal Verbal  Expression Overall Verbal Expression: Impaired Automatic Speech: Name;Social Response;Counting;Month of year;Day of week Level of Generative/Spontaneous Verbalization: Conversation Repetition: Impaired Level of Impairment: Word level Naming: Impairment Confrontation: Within functional limits Oral Motor Oral Motor/Sensory Function Overall Oral Motor/Sensory Function: Within functional limits Motor Speech Overall Motor Speech: Impaired Respiration: Within functional limits Resonance: Within functional limits Articulation: Within functional limitis Intelligibility: Intelligible Motor Planning: Impaired Level of Impairment: Word Motor Speech Errors: Aware  Care Tool Care Tool Cognition Ability to hear (with hearing aid or hearing appliances if normally used Ability to hear (with hearing aid or hearing appliances if normally used): 0. Adequate - no difficulty in normal conservation, social interaction, listening to TV   Expression of Ideas and Wants Expression of Ideas and Wants: 2. Frequent difficulty - frequently exhibits difficulty with expressing needs and ideas   Understanding Verbal and Non-Verbal Content Understanding Verbal and Non-Verbal Content: 3. Usually understands - understands most conversations, but misses some part/intent of message. Requires cues at times to understand  Memory/Recall Ability Memory/Recall Ability : Current season;Location of own room;That he or she is in a hospital/hospital unit    Bedside Swallowing Assessment General Diet Prior to this Study: Regular;Thin liquids (Level 0) Respiratory Status: Room air Behavior/Cognition: Alert;Cooperative Oral Cavity - Dentition: Edentulous Self-Feeding Abilities: Needs set up Patient Positioning: Upright in bed Baseline Vocal Quality: Normal  Ice Chips Ice chips: Not tested Thin Liquid Thin Liquid: Impaired Pharyngeal  Phase Impairments: Throat Clearing - Immediate Nectar Thick Nectar Thick Liquid: Not  tested Honey Thick Honey Thick Liquid: Not tested Puree Puree: Impaired Presentation: Self Fed Pharyngeal Phase Impairments: Throat Clearing - Immediate Solid Solid: Within functional limits Presentation: Self Fed BSE Assessment Risk for Aspiration Impact on safety and function: Mild aspiration risk Other Related Risk Factors: History of GERD;Previous CVA  Short Term Goals: Week 1: SLP Short Term Goal 1 (Week 1):  Patient will verbally communicate wants/needs with min multimodal A SLP Short Term Goal 2 (Week 1): Patient will answer complex yes/no questions with 80% accuracy given min multimodal A SLP Short Term Goal 3 (Week 1): Patient will consume least restrictive diet with use of compensatory strategies given supervision A  Refer to Care Plan for Long Term Goals  Recommendations for other services: None   Discharge Criteria: Patient will be discharged from SLP if patient refuses treatment 3 consecutive times without medical reason, if treatment goals not met, if there is a change in medical status, if patient makes no progress towards goals or if patient is discharged from hospital.  The above assessment, treatment plan, treatment alternatives and goals were discussed and mutually agreed upon: by patient  Cheyanne Lamison M.A., CCC-SLP 06/23/2024, 10:18 AM

## 2024-06-24 DIAGNOSIS — I639 Cerebral infarction, unspecified: Secondary | ICD-10-CM | POA: Diagnosis not present

## 2024-06-24 NOTE — Progress Notes (Signed)
 PROGRESS NOTE   Subjective/Complaints:  Left MCA infarct 9/16 but with hx of multiple cortical and subcortical infarcts c/w SVD +/- cardioembolic , has loop but no evidence of   Pt reports frustration with home stuff- had difficulty expressing her concerns.  Not hungry (per nurse rarely eats enough) and not finishing her Ensure either.  Had a large type 2 BM this AM after I saw her.   Sent message to team about pt  ROS-  Pt denies SOB, abd pain, CP, N/V/C/D, and vision changes  Limited due to aphasia Objective:   DG Abd 1 View Result Date: 06/22/2024 EXAM: 1 VIEW XRAY OF THE ABDOMEN 06/22/2024 07:31:13 PM COMPARISON: CT abdomen and pelvis from 05/27/2024 CLINICAL HISTORY: Constipation FINDINGS: BOWEL: Moderate stool throughout the colon. Stool noted within the rectum. SOFT TISSUES: No opaque urinary calculi. BONES: No acute osseous abnormality. Multilevel degenerative changes of the spine. Moderate degenerative changes of the right hip. IMPRESSION: 1. Moderate colonic and rectal stool burden without obstructive pattern Electronically signed by: Luke Bun MD 06/22/2024 08:47 PM EST RP Workstation: [id number]   Recent Labs    06/22/24 1821 06/23/24 0443  WBC 10.0 8.1  HGB 11.5* 10.4*  HCT 34.9* 32.1*  PLT 239 204   Recent Labs    06/22/24 1821 06/23/24 0443  NA  --  142  K  --  3.6  CL  --  105  CO2  --  28  GLUCOSE  --  94  BUN  --  10  CREATININE 0.89 0.84  CALCIUM   --  9.4    Intake/Output Summary (Last 24 hours) at 06/24/2024 0903 Last data filed at 06/24/2024 0902 Gross per 24 hour  Intake 240 ml  Output 1 ml  Net 239 ml        Physical Exam: Vital Signs Blood pressure (!) 142/73, pulse 94, temperature 97.7 F (36.5 C), temperature source Oral, resp. rate 16, height 5' 4 (1.626 m), weight 57.9 kg, SpO2 97%.     General: awake, alert, appropriate,  woke her from sleep; NAD HENT: conjugate  gaze; oropharynx moist CV: regular rate and rhythm; no JVD Pulmonary: CTA B/L; no W/R/R- good air movement GI: soft, NT, ND, (+)BS- hypoactive saw before BM Psychiatric: frustrated, depressed affect Neurological: moderately affected by expressive aphasia- a lot of word substitutions, neologisms and overall aphasia Neurologic: Cranial nerves II through XII intact, motor strength is 4+/5 in bilateral deltoid, bicep, tricep, grip, hip flexor, knee extensors, ankle dorsiflexor and plantar flexor Sensory exam normal sensation to light touch and proprioception in bilateral upper and lower extremities Cerebellar exam normal finger to nose to finger as well as heel to shin in bilateral upper and lower extremities Finger to thumb intact  Musculoskeletal: Full range of motion in all 4 extremities. No joint swelling Unable to name stethoscope but can name simple objects, Pen, Watch , Glasses, comprehension for simple info is good   Assessment/Plan: 1. Functional deficits which require 3+ hours per day of interdisciplinary therapy in a comprehensive inpatient rehab setting. Physiatrist is providing close team supervision and 24 hour management of active medical problems listed below. Physiatrist and rehab team continue to assess  barriers to discharge/monitor patient progress toward functional and medical goals  Care Tool:  Bathing    Body parts bathed by patient: Right arm, Left arm, Chest, Abdomen, Front perineal area, Buttocks, Face, Left lower leg, Right lower leg, Left upper leg, Right upper leg         Bathing assist Assist Level: Supervision/Verbal cueing     Upper Body Dressing/Undressing Upper body dressing   What is the patient wearing?: Pull over shirt    Upper body assist Assist Level: Supervision/Verbal cueing    Lower Body Dressing/Undressing Lower body dressing      What is the patient wearing?: Underwear/pull up, Pants     Lower body assist Assist for lower body  dressing: Contact Guard/Touching assist     Toileting Toileting    Toileting assist Assist for toileting: Contact Guard/Touching assist     Transfers Chair/bed transfer  Transfers assist     Chair/bed transfer assist level: Contact Guard/Touching assist     Locomotion Ambulation   Ambulation assist      Assist level: Contact Guard/Touching assist Assistive device: Walker-rolling Max distance: 48   Walk 10 feet activity   Assist     Assist level: Contact Guard/Touching assist Assistive device: Walker-rolling   Walk 50 feet activity   Assist Walk 50 feet with 2 turns activity did not occur: Safety/medical concerns         Walk 150 feet activity   Assist Walk 150 feet activity did not occur: Safety/medical concerns         Walk 10 feet on uneven surface  activity   Assist     Assist level: Minimal Assistance - Patient > 75% Assistive device: Walker-rolling   Wheelchair     Assist Is the patient using a wheelchair?: Yes Type of Wheelchair: Manual    Wheelchair assist level: Supervision/Verbal cueing Max wheelchair distance: 150    Wheelchair 50 feet with 2 turns activity    Assist        Assist Level: Supervision/Verbal cueing   Wheelchair 150 feet activity     Assist      Assist Level: Supervision/Verbal cueing   Blood pressure (!) 142/73, pulse 94, temperature 97.7 F (36.5 C), temperature source Oral, resp. rate 16, height 5' 4 (1.626 m), weight 57.9 kg, SpO2 97%.  Medical Problem List and Plan: 1. Functional deficits secondary to right precentral gyrus infarction in the setting of other subacute/chronic, likely cardioembolic infarct with history of Left MCA CVA 04/12/2024 (at CIR until 10/1), s/p left cerebellar infarct 05/22/2024  status post loop recorder received CIR             -patient may  shower             -ELOS/Goals: 10-14 days supervision, but will need close Sup or even CGA, Berg was low even after  CVA in Sept , now has had 2 more CVAs              Con't CIR PT, OT and SLP- no therapy today 2.  Antithrombotics: -DVT/anticoagulation:  Pharmaceutical: Lovenox              -antiplatelet therapy: Aspirin  81 mg daily and Plavix  75 mg daily x 3 months then Plavix  alone 3. Pain Management/fibromyalgia: Cymbalta  30 mg twice daily, oxycodone  5 mg every 6 hours as needed 4. Mood/Behavior/Sleep: Trazodone  25 mg nightly, Klonopin  0.25 mg daily as needed anxiety, melatonin 5 mg nightly as needed sleep             -  antipsychotic agents: N/A 5. Neuropsych/cognition: This patient is not capable of making decisions on her own behalf.  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 Skin/Wound Care: Routine skin checks 7. Fluids/Electrolytes/Nutrition: Routine in and outs with follow-up chemistries 8.  Seizure prophylaxis.  Keppra  500 mg twice daily.  EEG negative 9.  COPD/asthma.  Quit smoking 35 years ago.  Continue inhalers as directed 10.  Hyperlipidemia.  Crestor  11.  Rheumatoid arthritis.Arava  20 mg daily 12.  GERD.  Protonix  13. Poor appetite- not eating per pt  11/27- Nursing and I spoke about this- encouraging Ensure if won't eat 14. Constipation?- LBM before this admission- KUB moderate stool burden  Had BM last noc, cont Senna S    11/27- Had large type 2 BM this AM-  on 2 Senna-S daily- should improve with this  I spent a total of  50  minutes on total care today- >50% coordination of care- due to  Spent long period of time speaking with pt and addressing her concerns- main issue is doesn't have phone to call family/friends- also discussed other issues related to home- sent message to entire team to address. Also d/w nursing about B/B- and intake _ might benefit from Calorie count  LOS: 2 days A FACE TO FACE EVALUATION WAS PERFORMED  Kaylen Nghiem 06/24/2024, 9:03 AM

## 2024-06-24 NOTE — Plan of Care (Signed)
  Problem: Consults Goal: RH STROKE PATIENT EDUCATION Description: See Patient Education module for education specifics  Outcome: Progressing Goal: Nutrition Consult-if indicated Outcome: Progressing   Problem: RH BOWEL ELIMINATION Goal: RH STG MANAGE BOWEL WITH ASSISTANCE Description: STG Manage Bowel with mod I Assistance. Outcome: Progressing Goal: RH STG MANAGE BOWEL W/MEDICATION W/ASSISTANCE Description: STG Manage Bowel with Medication with mod I Assistance. Outcome: Progressing   Problem: RH SAFETY Goal: RH STG ADHERE TO SAFETY PRECAUTIONS W/ASSISTANCE/DEVICE Description: STG Adhere to Safety Precautions With cues Assistance/Device. Outcome: Progressing   Problem: RH KNOWLEDGE DEFICIT Goal: RH STG INCREASE KNOWLEDGE OF HYPERTENSION Description: Patient and dtr will be able to manage HTN using educational resources for medications independently Outcome: Progressing Goal: RH STG INCREASE KNOWLEDGE OF STROKE PROPHYLAXIS Description: Patient and dtr will be able to manage secondary risks using educational resources for medications  and dietary modification independently Outcome: Progressing

## 2024-06-25 DIAGNOSIS — I639 Cerebral infarction, unspecified: Secondary | ICD-10-CM | POA: Diagnosis not present

## 2024-06-25 DIAGNOSIS — M797 Fibromyalgia: Secondary | ICD-10-CM

## 2024-06-25 DIAGNOSIS — K5901 Slow transit constipation: Secondary | ICD-10-CM | POA: Diagnosis not present

## 2024-06-25 DIAGNOSIS — E46 Unspecified protein-calorie malnutrition: Secondary | ICD-10-CM

## 2024-06-25 DIAGNOSIS — I69320 Aphasia following cerebral infarction: Secondary | ICD-10-CM | POA: Diagnosis not present

## 2024-06-25 MED ORDER — PREGABALIN 25 MG PO CAPS
25.0000 mg | ORAL_CAPSULE | Freq: Two times a day (BID) | ORAL | Status: DC
  Administered 2024-06-25 – 2024-07-02 (×15): 25 mg via ORAL
  Filled 2024-06-25 (×15): qty 1

## 2024-06-25 MED ORDER — ADULT MULTIVITAMIN W/MINERALS CH
1.0000 | ORAL_TABLET | Freq: Every day | ORAL | Status: DC
  Administered 2024-06-25 – 2024-07-02 (×8): 1 via ORAL
  Filled 2024-06-25 (×8): qty 1

## 2024-06-25 MED ORDER — CYPROHEPTADINE HCL 4 MG PO TABS
4.0000 mg | ORAL_TABLET | Freq: Two times a day (BID) | ORAL | Status: DC
  Administered 2024-06-26 – 2024-06-28 (×6): 4 mg via ORAL
  Filled 2024-06-25 (×7): qty 1

## 2024-06-25 MED ORDER — ENSURE PLUS HIGH PROTEIN PO LIQD
237.0000 mL | Freq: Three times a day (TID) | ORAL | Status: DC
  Administered 2024-06-25 – 2024-07-02 (×21): 237 mL via ORAL
  Filled 2024-06-25: qty 237

## 2024-06-25 MED ORDER — CLONAZEPAM 0.125 MG PO TBDP
0.2500 mg | ORAL_TABLET | Freq: Two times a day (BID) | ORAL | Status: DC | PRN
  Administered 2024-06-27 – 2024-06-28 (×2): 0.25 mg via ORAL
  Filled 2024-06-25 (×3): qty 2

## 2024-06-25 NOTE — Progress Notes (Signed)
 Physical Therapy Session Note  Patient Details  Name: Betty Golden MRN: [id number] Date of Birth: [birth year]  Today's Date: 06/25/2024 PT Individual Time: 1010-1105; 1411 - 1437 PT Individual Time Calculation (min): 55 min 26 min   Short Term Goals: Week 1:  PT Short Term Goal 1 (Week 1): STG+ LTG 2/2 ELOS  SESSION 1 Skilled Therapeutic Interventions/Progress Updates: Patient long sitting in bed on entrance to room. Patient alert and agreeable to PT session.   Patient reported no pain during session. Beginning of session focused on building pt rapport  Therapeutic Activity: Bed Mobility: Pt performed long sitting to sit on EOB with supervision. Transfers: Pt performed sit<>stand transfers throughout session with CGA and increased cuing for hand placement.  Gait Training:  Pt ambulated roughly 130' x 1 and 165' x 1 using RW with CGA and progressed to close supervision. Pt demonstrated the following gait deviations with therapist providing the described cuing and facilitation for improvement:  - Pt forward flexed posture with cues to look forward ahead vs at ground and to depress B UE's - VC to increase step length as pt with close to step-to pattern and decreased cadence Pt required increased time to navigate distance and overall max cues to maintain upright posture with forward gaze, close proximity to RW, and step length  Neuromuscular Re-ed: NMR facilitated during session with focus on dynamic standing balance, weight shift. - Step to 6 step alternating B LE's with L HHA and overall minA. 2 rounds performed with rest breaks required  NMR performed for improvements in motor control and coordination, balance, sequencing, judgement, and self confidence/ efficacy in performing all aspects of mobility at highest level of independence.   Patient long sitting on bed at end of session with brakes locked, bed alarm set, and all needs within reach.  SESSION 2 Skilled Therapeutic  Interventions/Progress Updates: Patient long sitting in bed on entrance to room. Patient alert and agreeable to PT session.   Patient reported no pain. Pt reported decrease in appetite (PA arrived and discussed this with pt during session).   Therapeutic Activity: Bed Mobility: Pt performed long sitting<>sit on EOB with supervision for safety Transfers: Pt performed sit<>stand transfers throughout session with close supervision and VC for hand placement and safe management of RW.  Gait Training:  Pt ambulated 165' x 1 using RW with close supervision and max multimodal cuing to maintain upright standing posture, safe proximity to RW and following cues initially to match PTA's step length (step through). Pt also with decreased BOS but focus of this trial was to increase pt's gait tolerance and step length. Pt performed 2nd lap 165' with green theraband donned across RW bottom lateral bars as target cue for pt to maintain safe proximity to RW. Pt still required VC throughout to maintain, but also noted to have improved step length and cadence when adhering to cue to look forward ahead vs at ground. Pt required seated rest breaks  Patient semi-reclined in bed at end of session with brakes locked, bed alarm set, and all needs within reach.       Therapy Documentation Precautions:  Precautions Precautions: Fall Precaution/Restrictions Comments: aphasia at baseline, watch HR Restrictions Weight Bearing Restrictions Per Provider Order: No  Therapy/Group: Individual Therapy  Coburn Knaus PTA 06/25/2024, 12:15 PM

## 2024-06-25 NOTE — Progress Notes (Signed)
 Initial Nutrition Assessment  DOCUMENTATION CODES:   Severe malnutrition in context of acute illness/injury  INTERVENTION:  Ensure Plus High Protein po TID, each supplement provides 350 kcal and 20 grams of protein Magic cup BID with meals, each supplement provides 290 kcal and 9 grams of protein Encourage good PO intake  NUTRITION DIAGNOSIS:  Severe Malnutrition related to acute illness as evidenced by mild fat depletion, moderate muscle depletion, percent weight loss, severe muscle depletion.  GOAL:  Patient will meet greater than or equal to 90% of their needs  MONITOR:  PO intake, Supplement acceptance, I & O's, Labs, Weight trends  REASON FOR ASSESSMENT:  Consult Poor PO  ASSESSMENT:  78 y.o. female admitted to CIR after a fall at home and acute CVA. PMH includes COPD, HTN, GERD, fibromyalgia, and rheumatoid arthritis.   11/25 - Admitted  Patient in room at time of RD visit, finishing up with therapy. Patient reports that since her previous stroke in October she has not been able to eat anything. Shares that she is unsure why, multiple doctors have tried to help her as well. Patient reports that foods taste like cardboard. Reports that she is able to drink water  ok. Shares that when she was home her family was trying to get her to eat and would buy different foods and she still would not eat them.   Patient with Ensure on bedside table, reports that she likes them and was agreeable to try and drink. RD discussed additional oral nutrition supplement such as magic cup; pt also agreeable to try these. Pt expressed desire to want to try and eat better.  RD asked if patient had previously required an NG-tube for feeds after previous stroke and pt declined ever needing one and voiced that she would not want one now.   Meal Intake 11/26: 50% breakfast 11/27: 0% breakfast, 0% lunch, 0% dinner 11/28: 0% breakfast  Patient reports that her UBW was 150# prior to stroke in October and  now she is 128#. Patient able to express that she knows that she has lost weight by looking at her arms and legs. Per chart review, patient with a 18% weight loss within the past two months; this is clinically significant for time frame.   Admission/Current Weight: 57.9 kg  Nutrition Related Medications: Protonix , Senokot-S Labs: reviewed.   NUTRITION - FOCUSED PHYSICAL EXAM: Flowsheet Row Most Recent Value  Orbital Region Mild depletion  Upper Arm Region Mild depletion  Thoracic and Lumbar Region Mild depletion  Buccal Region Mild depletion  Temple Region Mild depletion  Clavicle Bone Region Moderate depletion  Clavicle and Acromion Bone Region Mild depletion  Scapular Bone Region Moderate depletion  Dorsal Hand Moderate depletion  Patellar Region Severe depletion  Anterior Thigh Region Severe depletion  Posterior Calf Region Severe depletion  Edema (RD Assessment) None  Hair Reviewed  Eyes Reviewed  Mouth Reviewed  Skin Reviewed  Nails Reviewed   Diet Order:   Diet Order             Diet regular Fluid consistency: Thin  Diet effective now                  EDUCATION NEEDS:  Education needs have been addressed  Skin:  Skin Assessment: Reviewed RN Assessment  Last BM:  11/27  Height:  Ht Readings from Last 1 Encounters:  06/22/24 5' 4 (1.626 m)   Weight:  Wt Readings from Last 1 Encounters:  06/22/24 57.9 kg   Ideal  Body Weight:  54.6 kg  BMI:  Body mass index is 21.91 kg/m.  Estimated Nutritional Needs:  Kcal:  1700-1900 Protein:  80-100 grams Fluid:  >/= 1.7 L   Nestora Glatter RD, LDN Registered Dietitian I Please see AMION for contact information

## 2024-06-25 NOTE — IPOC Note (Signed)
 Overall Plan of Care [facility]) Patient Details Name: Betty Golden MRN: [id number] DOB: 1946/03/06  Admitting Diagnosis: Cardioembolic stroke [facility])  Hospital Problems: Principal Problem:   Cardioembolic stroke [facility])     Functional Problem List: Nursing Bowel, Safety, Endurance, Medication Management, Pain  PT Balance, Safety, Endurance, Motor  OT Endurance, Balance, Motor  SLP Linguistic, Nutrition  TR         Basic ADL's: OT Dressing, Toileting     Advanced  ADL's: OT None     Transfers: PT Bed Mobility, Bed to Chair, Car, Occupational Psychologist, Research Scientist (life Sciences): PT Ambulation, Psychologist, [employer], Stairs     Additional Impairments: OT None  SLP Communication, Swallowing comprehension, expression    TR      Anticipated Outcomes Item Anticipated Outcome  Self Feeding no goal, pt is set up to independent  Swallowing  supervision   Basic self-care  supervision with lower body dressing, at goal level for bathing  Toileting  mod independent   Bathroom Transfers supervision  Bowel/Bladder  manage bowel w mod I assist  Transfers  bed mobility mod I, transfers OOB supervision  Locomotion  supervision w/ AD  Communication  min  Cognition     Pain  Pain managed < 4 with prns  Safety/Judgment  manage safety w cues   Therapy Plan: PT Intensity: Minimum of 1-2 x/day ,45 to 90 minutes PT Frequency: 5 out of 7 days PT Duration Estimated Length of Stay: 7-9 days OT Intensity: Minimum of 1-2 x/day, 45 to 90 minutes OT Frequency: Total of 15 hours over 7 days of combined therapies (15/7 based on low endurance and fibromyalgia) OT Duration/Estimated Length of Stay: 7-9 days SLP Intensity: Minumum of 1-2 x/day, 30 to 90 minutes SLP Frequency: 3 to 5 out of 7 days SLP Duration/Estimated Length of Stay: 10-12 days   Team Interventions: Nursing Interventions Patient/Family Education, Pain Management, Medication Management, Discharge Planning, Bowel  Management, Disease Management/Prevention  PT interventions Ambulation/gait training, Community reintegration, Neuromuscular re-education, Stair training, UE/LE Strength taining/ROM, Wheelchair propulsion/positioning, UE/LE Coordination activities, Therapeutic Activities, Discharge planning, Warden/ranger, Functional mobility training, Patient/family education, Therapeutic Exercise  OT Interventions Balance/vestibular training, Discharge planning, Functional mobility training, Neuromuscular re-education, Patient/family education, Psychosocial support, DME/adaptive equipment instruction, Self Care/advanced ADL retraining, Therapeutic Activities, Therapeutic Exercise, UE/LE Strength taining/ROM  SLP Interventions Cueing hierarchy, Functional tasks, Internal/external aids, Multimodal communication approach, Patient/family education, Speech/Language facilitation, Therapeutic Activities  TR Interventions    SW/CM Interventions Discharge Planning, Psychosocial Support, Patient/Family Education   Barriers to Discharge MD  Medical stability  Nursing Decreased caregiver support, Home environment access/layout 2 level main B+B 1 ste (garage) 1/2 rise w dtr.  PT Home environment access/layout    OT None    SLP      SW Insurance for SNF coverage     Team Discharge Planning: Destination: PT-Home ,OT- Home , SLP-Home Projected Follow-up: PT-Home health PT, OT-  Home health OT, SLP-Home Health SLP Projected Equipment Needs: PT-To be determined, OT- None recommended by OT, SLP-None recommended by SLP Equipment Details: PT- , OT-pt received DME at last admission Patient/family involved in discharge planning: PT- Patient,  OT-Patient, Family member/caregiver (briefly spoke with daughter in hallway after the session), SLP-Patient  MD ELOS: 7-9 days Medical Rehab Prognosis:  Excellent Assessment: The patient has been admitted for CIR therapies with the diagnosis of embolic cva. The team will  be addressing functional mobility, strength, stamina, balance, safety, adaptive techniques and equipment, self-care, bowel  and bladder mgt, patient and caregiver education, NMR, cognition, language, pain mgt, community reentry. Goals have been set at supervision to mod I with mobility and self-care and min assist with language and cognition. Anticipated discharge destination is home.        See Team Conference Notes for weekly updates to the plan of care

## 2024-06-25 NOTE — Progress Notes (Signed)
 Occupational Therapy Session Note  Patient Details  Name: Betty Golden MRN: [id number] Date of Birth: April 05, 1946  Today's Date: 06/25/2024 OT Individual Time: 1115-1200 OT Individual Time Calculation (min): 45 min    Short Term Goals: Week 1:  OT Short Term Goal 1 (Week 1): STGs = LTGs  Skilled Therapeutic Interventions/Progress Updates:    Pt received in bed with her daughter present. Her daughter had brought in a suitcase on Friday with all of pt's belongings and was there this morning as she had received call from Cardiologist that the phone to loop recorder was off.  Daughter reset up phone.  Pt stated she needed to shower.  As she started to sit up to EOB, pt stated she got a wave of feeling spacy.  Pt was perseverating on this feeling as she was trying to tell her dtr about mail she was expecting to be delivered. Needed to help pt refocus to tasks several times.  Once she was refocused, she did well ambulating to toilet, then shower, bathing, drying off, returned to EOB to dress. See ADL documentation below.  Pt tends to moan and breathe heavily with all effort but is able to continue the task. Pt opted to rest in bed. Alarm set and all needs met.   Therapy Documentation Precautions:  Precautions Precautions: Fall Precaution/Restrictions Comments: aphasia at baseline, watch HR Restrictions Weight Bearing Restrictions Per Provider Order: No    Pain: Pain Assessment Pain Score: 0-No pain ADL: ADL Eating: Set up Grooming: Setup Upper Body Bathing: Supervision/safety Where Assessed-Upper Body Bathing: Shower Lower Body Bathing: Supervision/safety Where Assessed-Lower Body Bathing: Shower Upper Body Dressing: Supervision/safety Where Assessed-Upper Body Dressing: Chair Lower Body Dressing: Minimal assistance (pt able to don underwear and socks but became fatigued and needed A with getting pants over feet, pt able to pull over hips) Where Assessed-Lower Body Dressing:  Chair Toileting: Supervision/safety Where Assessed-Toileting: Teacher, Adult Education: Furniture Conservator/restorer Method: Proofreader: Acupuncturist: Administrator, Arts Method: Designer, Industrial/product: Emergency planning/management officer, Grab bars    Therapy/Group: Individual Therapy  [name] 06/25/2024, 12:48 PM

## 2024-06-25 NOTE — Progress Notes (Signed)
 Speech Language Pathology Daily Session Note  Patient Details  Name: Betty Golden MRN: [id number] Date of Birth: May 01, 1946  Today's Date: 06/25/2024 SLP Individual Time: 0900-0959 SLP Individual Time Calculation (min): 59 min  Short Term Goals: Week 1: SLP Short Term Goal 1 (Week 1): Patient will verbally communicate wants/needs with min multimodal A SLP Short Term Goal 2 (Week 1): Patient will answer complex yes/no questions with 80% accuracy given min multimodal A SLP Short Term Goal 3 (Week 1): Patient will consume least restrictive diet with use of compensatory strategies given supervision A  Skilled Therapeutic Interventions: Skilled therapy session focused on communication goals. SLP facilitated session by targeting word finding and fluency. SLP attempted to educate patient on easy onset technique, however patient with significant difficulty understanding voicing upon exhale. SLP instead targeted word finding with synonym/antonyms. Patient with 93% accuracy during antonym task given minA. Patient with increased difficulty during synonym exercises, requiring choice of three. Given choice of three, patient with 88% accuracy. At the end of the session, patient requested transfer to BR via RW. Patient continent of bladder. Patient left in bed with alarm set and call bell in reach. Continue POC  Pain denies  Therapy/Group: Individual Therapy  Meesha Sek M.A., CCC-SLP 06/25/2024, 7:39 AM

## 2024-06-25 NOTE — Progress Notes (Signed)
 PROGRESS NOTE   Subjective/Complaints:  Pt in bed playing on solitaire. Reports ?nerve pain in both legs which is chronic.   Review limited by aphasia   Objective:   No results found.  Recent Labs    06/22/24 1821 06/23/24 0443  WBC 10.0 8.1  HGB 11.5* 10.4*  HCT 34.9* 32.1*  PLT 239 204   Recent Labs    06/22/24 1821 06/23/24 0443  NA  --  142  K  --  3.6  CL  --  105  CO2  --  28  GLUCOSE  --  94  BUN  --  10  CREATININE 0.89 0.84  CALCIUM   --  9.4    Intake/Output Summary (Last 24 hours) at 06/25/2024 1107 Last data filed at 06/24/2024 2008 Gross per 24 hour  Intake 472 ml  Output 1 ml  Net 471 ml        Physical Exam: Vital Signs Blood pressure (!) 153/73, pulse 97, temperature 97.9 F (36.6 C), temperature source Oral, resp. rate 18, height 5' 4 (1.626 m), weight 57.9 kg, SpO2 96%.     Constitutional: No distress . Vital signs reviewed. HEENT: NCAT, EOMI, oral membranes moist Neck: supple Cardiovascular: RRR without murmur. No JVD    Respiratory/Chest: CTA Bilaterally without wheezes or rales. Normal effort    GI/Abdomen: BS +, non-tender, non-distended Ext: no clubbing, cyanosis, or edema Psych: pleasant and cooperative  Neurological: very alert. No focal cn findings. aphasic with word substitutions. Usually can decipher what she's saying if you give her some time Neurologic: Cranial nerves II through XII intact, motor strength is 4+/5 in bilateral deltoid, bicep, tricep, grip, hip flexor, knee extensors, ankle dorsiflexor and plantar flexor Sensory exam normal sensation to light touch and proprioception in bilateral upper and lower extremities Cerebellar exam normal finger to nose to finger as well as heel to shin in bilateral upper and lower extremities Finger to thumb intact  Musculoskeletal: Full range of motion in all 4 extremities. No joint swelling Unable to name stethoscope but  can name simple objects, Pen, Watch , Glasses, comprehension for simple info is good   Assessment/Plan: 1. Functional deficits which require 3+ hours per day of interdisciplinary therapy in a comprehensive inpatient rehab setting. Physiatrist is providing close team supervision and 24 hour management of active medical problems listed below. Physiatrist and rehab team continue to assess barriers to discharge/monitor patient progress toward functional and medical goals  Care Tool:  Bathing    Body parts bathed by patient: Right arm, Left arm, Chest, Abdomen, Front perineal area, Buttocks, Face, Left lower leg, Right lower leg, Left upper leg, Right upper leg         Bathing assist Assist Level: Supervision/Verbal cueing     Upper Body Dressing/Undressing Upper body dressing   What is the patient wearing?: Pull over shirt    Upper body assist Assist Level: Supervision/Verbal cueing    Lower Body Dressing/Undressing Lower body dressing      What is the patient wearing?: Underwear/pull up, Pants     Lower body assist Assist for lower body dressing: Contact Guard/Touching assist     Toileting Toileting  Toileting assist Assist for toileting: Contact Guard/Touching assist     Transfers Chair/bed transfer  Transfers assist     Chair/bed transfer assist level: Contact Guard/Touching assist     Locomotion Ambulation   Ambulation assist      Assist level: Contact Guard/Touching assist Assistive device: Walker-rolling Max distance: 48   Walk 10 feet activity   Assist     Assist level: Contact Guard/Touching assist Assistive device: Walker-rolling   Walk 50 feet activity   Assist Walk 50 feet with 2 turns activity did not occur: Safety/medical concerns         Walk 150 feet activity   Assist Walk 150 feet activity did not occur: Safety/medical concerns         Walk 10 feet on uneven surface  activity   Assist     Assist level:  Minimal Assistance - Patient > 75% Assistive device: Walker-rolling   Wheelchair     Assist Is the patient using a wheelchair?: Yes Type of Wheelchair: Manual    Wheelchair assist level: Supervision/Verbal cueing Max wheelchair distance: 150    Wheelchair 50 feet with 2 turns activity    Assist        Assist Level: Supervision/Verbal cueing   Wheelchair 150 feet activity     Assist      Assist Level: Supervision/Verbal cueing   Blood pressure (!) 153/73, pulse 97, temperature 97.9 F (36.6 C), temperature source Oral, resp. rate 18, height 5' 4 (1.626 m), weight 57.9 kg, SpO2 96%.  Medical Problem List and Plan: 1. 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  Antithrombotics: -DVT/anticoagulation:  Pharmaceutical: Lovenox              -antiplatelet therapy: Aspirin  81 mg daily and Plavix  75 mg daily x 3 months then Plavix  alone 3. Pain Management/fibromyalgia: Cymbalta  30 mg twice daily, oxycodone  5 mg every 6 hours as needed  -11/28 pt was on lyrica  PTA, 25mg  bid--will resume for bilateral leg pain,FMS 4. Mood/Behavior/Sleep: Trazodone  25 mg nightly, Klonopin  0.25 mg daily as needed anxiety, melatonin 5 mg nightly as needed sleep             -antipsychotic agents: N/A 5. Neuropsych/cognition: This patient is not capable of making decisions on her own behalf.  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 Skin/Wound Care: Routine skin checks 7.  Fluids/Electrolytes/Nutrition: Pt is eating nothing  -will ask RD to assess  -offer supplements  -consider appetite stimulant but hesitate using megace given her multiple strokes 8.  Seizure prophylaxis.  Keppra  500 mg twice daily.  EEG negative 9.  COPD/asthma.  Quit smoking 35 years ago.  Continue inhalers as directed 10.  Hyperlipidemia.  Crestor  11.  Rheumatoid arthritis.Arava  20 mg daily 12.  GERD.  Protonix   13. Constipation - LBM  11/27,  large - KUB moderate stool burden 11.25   - cont Senna S 2 per day         LOS: 3 days A FACE TO FACE EVALUATION WAS PERFORMED  Arthea ONEIDA Gunther 06/25/2024, 11:07 AM

## 2024-06-25 NOTE — Plan of Care (Signed)
  Problem: Consults Goal: RH STROKE PATIENT EDUCATION Description: See Patient Education module for education specifics  Outcome: Progressing Goal: Nutrition Consult-if indicated Outcome: Progressing   Problem: RH BOWEL ELIMINATION Goal: RH STG MANAGE BOWEL WITH ASSISTANCE Description: STG Manage Bowel with mod I Assistance. Outcome: Progressing Goal: RH STG MANAGE BOWEL W/MEDICATION W/ASSISTANCE Description: STG Manage Bowel with Medication with mod I Assistance. Outcome: Progressing   Problem: RH SAFETY Goal: RH STG ADHERE TO SAFETY PRECAUTIONS W/ASSISTANCE/DEVICE Description: STG Adhere to Safety Precautions With cues Assistance/Device. Outcome: Progressing   Problem: RH KNOWLEDGE DEFICIT Goal: RH STG INCREASE KNOWLEDGE OF HYPERTENSION Description: Patient and dtr will be able to manage HTN using educational resources for medications independently Outcome: Progressing Goal: RH STG INCREASE KNOWLEDGE OF STROKE PROPHYLAXIS Description: Patient and dtr will be able to manage secondary risks using educational resources for medications  and dietary modification independently Outcome: Progressing

## 2024-06-26 DIAGNOSIS — I69320 Aphasia following cerebral infarction: Secondary | ICD-10-CM | POA: Diagnosis not present

## 2024-06-26 DIAGNOSIS — M797 Fibromyalgia: Secondary | ICD-10-CM | POA: Diagnosis not present

## 2024-06-26 DIAGNOSIS — I639 Cerebral infarction, unspecified: Secondary | ICD-10-CM | POA: Diagnosis not present

## 2024-06-26 DIAGNOSIS — K5901 Slow transit constipation: Secondary | ICD-10-CM | POA: Diagnosis not present

## 2024-06-26 DIAGNOSIS — E43 Unspecified severe protein-calorie malnutrition: Secondary | ICD-10-CM | POA: Insufficient documentation

## 2024-06-26 LAB — GLUCOSE, CAPILLARY: Glucose-Capillary: 117 mg/dL — ABNORMAL HIGH (ref 70–99)

## 2024-06-26 NOTE — Progress Notes (Signed)
 Occupational Therapy Session Note  Patient Details  Name: [name] MRN: [id number] Date of Birth: 1946-05-12  Today's Date: 06/26/2024 OT Individual Time: 9076-8964 OT Individual Time Calculation (min): 72 min    Short Term Goals: Week 1:  OT Short Term Goal 1 (Week 1): STGs = LTGs  Skilled Therapeutic Interventions/Progress Updates:     Initial Encounter: Patient resting in bed watching TV at the time of arrival. The pt inidicated that she rested during the night and that she had no pain to report at the time of treatment. The pt indicated that she needed to go to the restroom.   BADL Task: The pt was able to come from supine in bed to EOB with close S. The pt was able to transfer from EOB to standing using the RW and the bed with CGA. The pt was able to complete a stand pivot transfer using the RW and the arm of the w/c for placement. The pt was transported to the restroom and was able to come from sit to stand using the grab bar for doffing her LB garments at CGA. The pt was able to transfer from stand to sitting on the commode with CGA and vc's for placement. The pt was able to complete toileting hygiene with closeS after voiding. The pt was able to donn her LB garment, a pull-up and pants with closeS. The pt was able to ambulate to the sink using the RW with CGA. The pt was able to go into sit  at the sink for placement in the w/c at [facility]. The pt was able to wash her face and brush her teeth with s/uA.   NMR and BUE strength: The pt was able to complete NRM of the LUE, inclusive of the scapular, head, neck shld, arm, forearm, and hand .  The pt went on to complete a oppositional force activity involving resistance with BUE  on a frontal plane 3x.  The pt was able to complete a oppositional force activity incorporating a 1lb doweled positioned in shld flexion against resistance in a downward motion 4x with rest breaks as needed, the pt requried 2 rest breaks. The pt went on to complete  UB exercises using a 3lb dumb bell 1 set of 10 for bicep curls, shld flexion ,horizontal abduction, elbow extension, and lifts with rest breaks as needed, the pt required 1 rest break with each exercise.   Therapy Documentation Precautions:  Precautions Precautions: Fall Precaution/Restrictions Comments: aphasia at baseline, watch HR Restrictions Weight Bearing Restrictions Per Provider Order: No  Therapy/Group: Individual Therapy  [name] 06/26/2024, 12:57 PM

## 2024-06-26 NOTE — Progress Notes (Signed)
 Physical Therapy Session Note  Patient Details  Name: Betty Golden MRN: [id number] Date of Birth: 11-18-1945  Today's Date: 06/26/2024 PT Individual Time: 1120-1200; 1344 - 1444 PT Individual Time Calculation (min): 40 min 55 min   Short Term Goals: Week 1:  PT Short Term Goal 1 (Week 1): STG+ LTG 2/2 ELOS  SESSION 1 Skilled Therapeutic Interventions/Progress Updates: Patient sitting in WC on entrance to room. Patient alert and agreeable to PT session.   Patient reported no pain. PTA discussed with attending nsg regarding pt's L pupil being more dilated than R. Nsg reported pt had increase in slurred speech and R sided weakness that resolved within 15 minutes right before PTA arrived to pt's room. Nsg communicated with covering physician. Session adjusted to fit pt's presentation in room for pt safety.   PTA obtained BP prior to 5xSTS 137/79 (90) 96 bpm HR; and after 152/74 (96) 106 bpm HR. Pt did not report any increase in symptoms. BP monitored one more time in standing 142/72 (94) 116 bpm HR  Pt did not report any HA or pain throughout session. Pt drank some water  during session and was encouraged to eat lunch as pt has not been eating well since arrival to inpatient  Therapeutic Activity: Transfers: Pt performed sit<>stand transfer from WC<EOB with minA and no AD and VC to control descent.  Five times Sit to Stand Test (FTSS) Method: Use a straight back chair with a solid seat that is 16-18" high. Ask participant to sit on the chair with arms folded across their chest.   Instructions: "Stand up and sit down as quickly as possible 5 times, keeping your arms folded across your chest."   Measurement: Stop timing when the participant stands the 5th time.   TIME: ___28.62___ (in seconds)    Times > 13.6 seconds is associated with increased disability and morbidity (Guralnik, 2000) Times > 15 seconds is predictive of recurrent falls in healthy individuals aged 60 and older  (Buatois, et al., 2008) Normal performance values in community dwelling individuals aged 76 and older (Bohannon, 2006): 60-69 years: 11.4 seconds 70-79 years: 12.6 seconds 80-89 years: 14.8 seconds   MCID: >= 2.3 seconds for Vestibular Disorders (Meretta, 2006)  Neuromuscular Re-ed: NMR facilitated during session with focus on attention to task and neuromuscular control/motor planning. - LAQ with 5lb ankle weight donned B LE. Pt cued to perform knee extension then hip flexion to avoid heel touching floor. 3 rounds close to fatigue with rest breaks required. Pt required increased effort/time to motor plan sequence. Pt also cued to hold knee extension for 3 seconds and to control eccentric  NMR performed for improvements in motor control and coordination, balance, sequencing, judgement, and self confidence/ efficacy in performing all aspects of mobility at highest level of independence.   Patient sitting EOB at end of session with brakes locked, bed alarm set, and all needs within reach.  SESSION 2 Skilled Therapeutic Interventions/Progress Updates: Patient supine in bed on entrance to room. Patient alert and agreeable to PT session.   Patient reported no pain and stated she ate most of her meal! Pt also stated it was difficult but also wants to get better.  Therapeutic Activity: Bed Mobility: Pt performed supine<>sit on EOB with supervision.  Transfers: Pt performed sit<>stand transfers throughout session with minA. Provided VC for sequence of pivot and hand placement and safe RW management. Pt with narrow BOS to stand and knees touching (intervention performed below).   Therapeutic Exercise: Pt performed  the following exercises with therapist providing the described cuing and facilitation for improvement. - Seated hip abduction B with yellow theraband donned. Pt cued to control eccentric and to hold abduction for 2-3 seconds. 3 rounds till fatigue with rest breaks - sit<>stand with yellow  theraband donned around B knees and pt cued to keep B feet planted on floor neutral width apart, and to maintain knees with space between them. 4 rounds performed with rest break required. Pt performed one round with tidal tank in B UE's and no theraband donned around knees and maintaining B knees open throughout.   Patient long sitting on bed at end of session with brakes locked, bed alarm set, and all needs within reach.       Therapy Documentation Precautions:  Precautions Precautions: Fall Precaution/Restrictions Comments: aphasia at baseline, watch HR Restrictions Weight Bearing Restrictions Per Provider Order: No   Therapy/Group: Individual Therapy  Jakhia Buxton PTA 06/26/2024, 12:16 PM

## 2024-06-26 NOTE — Plan of Care (Signed)
  Problem: Consults Goal: RH STROKE PATIENT EDUCATION Description: See Patient Education module for education specifics  Outcome: Progressing Goal: Nutrition Consult-if indicated Outcome: Progressing   Problem: RH BOWEL ELIMINATION Goal: RH STG MANAGE BOWEL WITH ASSISTANCE Description: STG Manage Bowel with mod I Assistance. Outcome: Progressing Goal: RH STG MANAGE BOWEL W/MEDICATION W/ASSISTANCE Description: STG Manage Bowel with Medication with mod I Assistance. Outcome: Progressing   Problem: RH SAFETY Goal: RH STG ADHERE TO SAFETY PRECAUTIONS W/ASSISTANCE/DEVICE Description: STG Adhere to Safety Precautions With cues Assistance/Device. Outcome: Progressing   Problem: RH KNOWLEDGE DEFICIT Goal: RH STG INCREASE KNOWLEDGE OF HYPERTENSION Description: Patient and dtr will be able to manage HTN using educational resources for medications independently Outcome: Progressing Goal: RH STG INCREASE KNOWLEDGE OF STROKE PROPHYLAXIS Description: Patient and dtr will be able to manage secondary risks using educational resources for medications  and dietary modification independently Outcome: Progressing

## 2024-06-26 NOTE — Progress Notes (Addendum)
 Around 1050 patient calling nurses station complaining of pain. 1100 rounding on patient offering pain medication and meal supplement shake endorsed headache to the center of her forehead. Expressed headache coming on suddenly. Dysarthria observed. Delay with responses and stuttering. Ataxia observed with right and lower leg. Decreased sensation with right leg. + 4 with right arm and right leg. + 5 with left arm and left leg. Anisocoria observed with right pupil. BP slightly elevated.  Improvement with speech at this time. Able to follow commands. + 5 with extremities at this time. Generalized weakness with movement with lower extremities. Alert and oriented x 4. Able to follow commands at this time.  Patient encouraged to drink fluids. Educated about importance of eating meals and how meal supplements not a replacement for meals. To make effort to eat meal trays brought up.  Charge Nurse and Provider are aware & informed. Rapid Response Nurse contacted as well.

## 2024-06-26 NOTE — Plan of Care (Signed)
  Problem: Consults Goal: RH STROKE PATIENT EDUCATION Description: See Patient Education module for education specifics  Outcome: Progressing Goal: Nutrition Consult-if indicated Outcome: Progressing Note: Needed   Problem: RH BOWEL ELIMINATION Goal: RH STG MANAGE BOWEL WITH ASSISTANCE Description: STG Manage Bowel with mod I Assistance. Outcome: Progressing Goal: RH STG MANAGE BOWEL W/MEDICATION W/ASSISTANCE Description: STG Manage Bowel with Medication with mod I Assistance. Outcome: Progressing   Problem: RH SAFETY Goal: RH STG ADHERE TO SAFETY PRECAUTIONS W/ASSISTANCE/DEVICE Description: STG Adhere to Safety Precautions With cues Assistance/Device. Outcome: Progressing

## 2024-06-26 NOTE — Progress Notes (Signed)
 PROGRESS NOTE   Subjective/Complaints:  Pt slept well. We discussed her po intake. She recalled talking with RD yesterday.   ROS: limited due to language/communication    Objective:   No results found.  No results for input(s): WBC, HGB, HCT, PLT in the last 72 hours.  No results for input(s): NA, K, CL, CO2, GLUCOSE, BUN, CREATININE, CALCIUM  in the last 72 hours.   Intake/Output Summary (Last 24 hours) at 06/26/2024 1025 Last data filed at 06/26/2024 0756 Gross per 24 hour  Intake 586 ml  Output --  Net 586 ml        Physical Exam: Vital Signs Blood pressure (!) 141/69, pulse 93, temperature 98.8 F (37.1 C), temperature source Oral, resp. rate 18, height 5' 4 (1.626 m), weight 57.9 kg, SpO2 96%.     Constitutional: No distress . Vital signs reviewed. HEENT: NCAT, EOMI, oral membranes moist Neck: supple Cardiovascular: RRR without murmur. No JVD    Respiratory/Chest: CTA Bilaterally without wheezes or rales. Normal effort    GI/Abdomen: BS +, non-tender, non-distended Ext: no clubbing, cyanosis, or edema Psych: pleasant and cooperative  Neurological: very alert. No focal cn findings. aphasic with frequent word substitutions. Usually can decipher what she's saying if you give her some time to work through it. Comprehension appears fairly intact.   Cranial nerves II through XII intact, motor strength is 4+/5 in bilateral deltoid, bicep, tricep, grip, hip flexor, knee extensors, ankle dorsiflexor and plantar flexor Sensory exam normal sensation to light touch and proprioception in bilateral upper and lower extremities Cerebellar exam normal finger to nose to finger as well as heel to shin in bilateral upper and lower extremities Finger to thumb intact  Musculoskeletal: Full range of motion in all 4 extremities. No joint swelling  Assessment/Plan: 1. Functional deficits which require 3+  hours per day of interdisciplinary therapy in a comprehensive inpatient rehab setting. Physiatrist is providing close team supervision and 24 hour management of active medical problems listed below. Physiatrist and rehab team continue to assess barriers to discharge/monitor patient progress toward functional and medical goals  Care Tool:  Bathing    Body parts bathed by patient: Right arm, Left arm, Chest, Abdomen, Front perineal area, Buttocks, Face, Left lower leg, Right lower leg, Left upper leg, Right upper leg         Bathing assist Assist Level: Supervision/Verbal cueing     Upper Body Dressing/Undressing Upper body dressing   What is the patient wearing?: Pull over shirt    Upper body assist Assist Level: Supervision/Verbal cueing    Lower Body Dressing/Undressing Lower body dressing      What is the patient wearing?: Underwear/pull up, Pants     Lower body assist Assist for lower body dressing: Contact Guard/Touching assist     Toileting Toileting    Toileting assist Assist for toileting: Contact Guard/Touching assist     Transfers Chair/bed transfer  Transfers assist     Chair/bed transfer assist level: Contact Guard/Touching assist     Locomotion Ambulation   Ambulation assist      Assist level: Contact Guard/Touching assist Assistive device: Walker-rolling Max distance: 48   Walk 10 feet activity  Assist     Assist level: Contact Guard/Touching assist Assistive device: Walker-rolling   Walk 50 feet activity   Assist Walk 50 feet with 2 turns activity did not occur: Safety/medical concerns         Walk 150 feet activity   Assist Walk 150 feet activity did not occur: Safety/medical concerns         Walk 10 feet on uneven surface  activity   Assist     Assist level: Minimal Assistance - Patient > 75% Assistive device: Walker-rolling   Wheelchair     Assist Is the patient using a wheelchair?: Yes Type of  Wheelchair: Manual    Wheelchair assist level: Supervision/Verbal cueing Max wheelchair distance: 150    Wheelchair 50 feet with 2 turns activity    Assist        Assist Level: Supervision/Verbal cueing   Wheelchair 150 feet activity     Assist      Assist Level: Supervision/Verbal cueing   Blood pressure (!) 141/69, pulse 93, temperature 98.8 F (37.1 C), temperature source Oral, resp. rate 18, height 5' 4 (1.626 m), weight 57.9 kg, SpO2 96%.  Medical Problem List and Plan: 1. 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  Antithrombotics: -DVT/anticoagulation:  Pharmaceutical: Lovenox              -antiplatelet therapy: Aspirin  81 mg daily and Plavix  75 mg daily x 3 months then Plavix  alone 3. Pain Management/fibromyalgia: Cymbalta  30 mg twice daily, oxycodone  5 mg every 6 hours as needed  -11/28 pt was on lyrica  PTA, 25mg  bid--resumed for bilateral leg pain,FMS 4. Mood/Behavior/Sleep: Trazodone  25 mg nightly, Klonopin  0.25 mg daily as needed anxiety, melatonin 5 mg nightly as needed sleep             -antipsychotic agents: N/A 5. Neuropsych/cognition: This patient is not capable of making decisions on her own behalf.  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 Skin/Wound Care: Routine skin checks 7. Fluids/Electrolytes/Nutrition: Po intake remains poor  -appreciate RD consult and recs. Discussed importance with pt today  -encourage supplements, eat  anything she likes  -begin peractin trial, pt agrees 8.  Seizure prophylaxis.  Keppra  500 mg twice daily.  EEG negative 9.  COPD/asthma.  Quit smoking 35 years ago.  Continue inhalers as directed 10.  Hyperlipidemia.  Crestor  11.  Rheumatoid arthritis.Arava  20 mg daily 12.  GERD.  Protonix   13. Constipation - LBM  11/27, no change - KUB moderate stool burden 11.25   - cont Senna S 2 per day         LOS: 4 days A FACE TO FACE EVALUATION WAS PERFORMED  Arthea ONEIDA Gunther 06/26/2024, 10:25 AM

## 2024-06-26 NOTE — Progress Notes (Signed)
 Charge Nurse updated about vitals earlier in the afternoon for patient. Provider and Charge Nurse updated & made aware of patients' current vitals.

## 2024-06-27 ENCOUNTER — Ambulatory Visit: Attending: Cardiology

## 2024-06-27 ENCOUNTER — Encounter (HOSPITAL_COMMUNITY): Payer: Self-pay | Admitting: Physical Medicine & Rehabilitation

## 2024-06-27 DIAGNOSIS — G459 Transient cerebral ischemic attack, unspecified: Secondary | ICD-10-CM

## 2024-06-27 DIAGNOSIS — M797 Fibromyalgia: Secondary | ICD-10-CM | POA: Diagnosis not present

## 2024-06-27 DIAGNOSIS — K5901 Slow transit constipation: Secondary | ICD-10-CM | POA: Diagnosis not present

## 2024-06-27 DIAGNOSIS — I639 Cerebral infarction, unspecified: Secondary | ICD-10-CM | POA: Diagnosis not present

## 2024-06-27 DIAGNOSIS — I69320 Aphasia following cerebral infarction: Secondary | ICD-10-CM | POA: Diagnosis not present

## 2024-06-27 MED ORDER — SENNOSIDES-DOCUSATE SODIUM 8.6-50 MG PO TABS
2.0000 | ORAL_TABLET | Freq: Two times a day (BID) | ORAL | Status: DC
  Administered 2024-06-27 – 2024-06-28 (×3): 2 via ORAL
  Filled 2024-06-27 (×3): qty 2

## 2024-06-27 NOTE — Progress Notes (Signed)
 Speech Language Pathology Daily Session Note  Patient Details  Name: Betty Golden MRN: [id number] Date of Birth: 07-08-1946  Today's Date: 06/27/2024 SLP Individual Time: 0800-0900 SLP Individual Time Calculation (min): 60 min  Short Term Goals: Week 1: SLP Short Term Goal 1 (Week 1): Patient will verbally communicate wants/needs with min multimodal A SLP Short Term Goal 2 (Week 1): Patient will answer complex yes/no questions with 80% accuracy given min multimodal A SLP Short Term Goal 3 (Week 1): Patient will consume least restrictive diet with use of compensatory strategies given supervision A  Skilled Therapeutic Interventions:   Pt greeted at bedside for tx targeting communication. She did endorse feeling anxious upon SLP arrival, but was very pleasant and participative throughout tx tasks. SLP facilitated mildly complex responsive naming task (food/drinks). She benefited from modA overall, however, only minA required for word finding - modA required d/t apraxic distortions. She then completed a structured sentence completion task and benefited from minA for word finding and fluent speech production. During conversation, she initially required modA for sentence formulation, however, success improved to minA as pt became more familiar with this SLP. SLP also provided continued education re aphasia vs apraxia in conversation. At the end of tx tasks, she was left in her bed with the alarm set and call light within reach. Recommend cont ST per POC.   Pain Pain Assessment Pain Scale: 0-10 Pain Score: 9  Pain Type: Acute pain Pain Location: Head Pain Orientation: Mid Pain Descriptors / Indicators: Headache Pain Onset: On-going Pain medication provided during tx session - see EMR for more info  Therapy/Group: Individual Therapy  Recardo DELENA Mole 06/27/2024, 8:22 AM

## 2024-06-27 NOTE — Progress Notes (Signed)
 Occupational Therapy Session Note  Patient Details  Name: Betty Golden MRN: [id number] Date of Birth: 21-Jun-1946  Today's Date: 06/27/2024 OT Individual Time: 9094-9054 OT Individual Time Calculation (min): 40 min   Short Term Goals: Week 1:  OT Short Term Goal 1 (Week 1): STGs = LTGs  Skilled Therapeutic Interventions/Progress Updates:   Pt greeted resting in bed, no reports of pain, session focused on BADL retraining at shower-level. Ambulatory bathroom transfers with supervision (CGA for x1 posterior LOB over threshold) + RW +cuing for safe RW proximity. Pt undresses with supervision, seated on toilet. Continent bladder void, pericare with lateral leans. Max cues provided for sequencing of sower transfer as patient leaves RW behind, reaching out towards grab bars, seemingly unaware of safety risk. Bathing with up to CGA during standing care due to decreased safety awareness in the moment (patient standing while holding onto shower head, wash cloth, and soap). Dressing with Min A for L-sock due to pain, otherwise supervision. Pt completes sink-side grooming with Mod I. Pt remained sitting in WC with all immediate needs met and posey belt activated.   Therapy Documentation Precautions:  Precautions Precautions: Fall Precaution/Restrictions Comments: aphasia at baseline, watch HR Restrictions Weight Bearing Restrictions Per Provider Order: No   Therapy/Group: Individual Therapy  Nereida Habermann, OTR/L, MSOT  06/27/2024, 6:52 AM

## 2024-06-27 NOTE — Plan of Care (Signed)
  Problem: Consults Goal: RH STROKE PATIENT EDUCATION Description: See Patient Education module for education specifics  Outcome: Progressing Goal: Nutrition Consult-if indicated Outcome: Progressing   Problem: RH BOWEL ELIMINATION Goal: RH STG MANAGE BOWEL WITH ASSISTANCE Description: STG Manage Bowel with mod I Assistance. Outcome: Progressing Goal: RH STG MANAGE BOWEL W/MEDICATION W/ASSISTANCE Description: STG Manage Bowel with Medication with mod I Assistance. Outcome: Progressing   Problem: RH SAFETY Goal: RH STG ADHERE TO SAFETY PRECAUTIONS W/ASSISTANCE/DEVICE Description: STG Adhere to Safety Precautions With cues Assistance/Device. Outcome: Progressing   Problem: RH KNOWLEDGE DEFICIT Goal: RH STG INCREASE KNOWLEDGE OF HYPERTENSION Description: Patient and dtr will be able to manage HTN using educational resources for medications independently Outcome: Progressing Goal: RH STG INCREASE KNOWLEDGE OF STROKE PROPHYLAXIS Description: Patient and dtr will be able to manage secondary risks using educational resources for medications  and dietary modification independently Outcome: Progressing   Problem: Consults Goal: RH STROKE PATIENT EDUCATION Description: See Patient Education module for education specifics  Outcome: Progressing Goal: Nutrition Consult-if indicated Outcome: Progressing   Problem: RH BOWEL ELIMINATION Goal: RH STG MANAGE BOWEL WITH ASSISTANCE Description: STG Manage Bowel with mod I Assistance. Outcome: Progressing Goal: RH STG MANAGE BOWEL W/MEDICATION W/ASSISTANCE Description: STG Manage Bowel with Medication with mod I Assistance. Outcome: Progressing   Problem: RH SAFETY Goal: RH STG ADHERE TO SAFETY PRECAUTIONS W/ASSISTANCE/DEVICE Description: STG Adhere to Safety Precautions With cues Assistance/Device. Outcome: Progressing   Problem: RH KNOWLEDGE DEFICIT Goal: RH STG INCREASE KNOWLEDGE OF HYPERTENSION Description: Patient and dtr will be  able to manage HTN using educational resources for medications independently Outcome: Progressing Goal: RH STG INCREASE KNOWLEDGE OF STROKE PROPHYLAXIS Description: Patient and dtr will be able to manage secondary risks using educational resources for medications  and dietary modification independently Outcome: Progressing

## 2024-06-27 NOTE — Progress Notes (Signed)
 Patient without complaint of. HS BP-154/80. Frustrated with expressive aphasia. Continent using BSC. Betty Golden A

## 2024-06-27 NOTE — Progress Notes (Signed)
 PROGRESS NOTE   Subjective/Complaints:  Pt up in bed. Had some dizziness yesterday. Says she feels better today.  Ate a little better yeserday  ROS: Patient denies fever, rash, sore throat, blurred vision, nausea, vomiting, diarrhea, cough, shortness of breath or chest pain, joint or back/neck pain, headache, or mood change.    Objective:   No results found.  No results for input(s): WBC, HGB, HCT, PLT in the last 72 hours.  No results for input(s): NA, K, CL, CO2, GLUCOSE, BUN, CREATININE, CALCIUM  in the last 72 hours.   Intake/Output Summary (Last 24 hours) at 06/27/2024 0916 Last data filed at 06/26/2024 1800 Gross per 24 hour  Intake 1440 ml  Output --  Net 1440 ml        Physical Exam: Vital Signs Blood pressure (!) 164/77, pulse 92, temperature 98.4 F (36.9 C), temperature source Oral, resp. rate 18, height 5' 4 (1.626 m), weight 57.9 kg, SpO2 97%.     Constitutional: No distress . Vital signs reviewed. HEENT: NCAT, EOMI, oral membranes moist Neck: supple Cardiovascular: RRR without murmur. No JVD    Respiratory/Chest: CTA Bilaterally without wheezes or rales. Normal effort    GI/Abdomen: BS +, non-tender, non-distended Ext: no clubbing, cyanosis, or edema Psych: pleasant and cooperative, sl anxious  Neurological: very alert. No focal cn findings. aphasic with word substitutions although context of language/word finding is improving.  Comprehension appears fairly intact.   Cranial nerves II through XII intact, motor strength is 4+/5 in bilateral deltoid, bicep, tricep, grip, hip flexor, knee extensors, ankle dorsiflexor and plantar flexor Sensory exam normal sensation to light touch and proprioception in bilateral upper and lower extremities Cerebellar exam normal finger to nose to finger as well as heel to shin in bilateral upper and lower extremities Finger to thumb intact   Musculoskeletal: Full range of motion in all 4 extremities. No joint swelling  Assessment/Plan: 1. Functional deficits which require 3+ hours per day of interdisciplinary therapy in a comprehensive inpatient rehab setting. Physiatrist is providing close team supervision and 24 hour management of active medical problems listed below. Physiatrist and rehab team continue to assess barriers to discharge/monitor patient progress toward functional and medical goals  Care Tool:  Bathing    Body parts bathed by patient: Right arm, Left arm, Chest, Abdomen, Front perineal area, Buttocks, Face, Left lower leg, Right lower leg, Left upper leg, Right upper leg         Bathing assist Assist Level: Supervision/Verbal cueing     Upper Body Dressing/Undressing Upper body dressing   What is the patient wearing?: Pull over shirt    Upper body assist Assist Level: Supervision/Verbal cueing    Lower Body Dressing/Undressing Lower body dressing      What is the patient wearing?: Underwear/pull up, Pants     Lower body assist Assist for lower body dressing: Contact Guard/Touching assist     Toileting Toileting    Toileting assist Assist for toileting: Contact Guard/Touching assist     Transfers Chair/bed transfer  Transfers assist     Chair/bed transfer assist level: Contact Guard/Touching assist     Locomotion Ambulation   Ambulation assist  Assist level: Contact Guard/Touching assist Assistive device: Walker-rolling Max distance: 48   Walk 10 feet activity   Assist     Assist level: Contact Guard/Touching assist Assistive device: Walker-rolling   Walk 50 feet activity   Assist Walk 50 feet with 2 turns activity did not occur: Safety/medical concerns         Walk 150 feet activity   Assist Walk 150 feet activity did not occur: Safety/medical concerns         Walk 10 feet on uneven surface  activity   Assist     Assist level: Minimal  Assistance - Patient > 75% Assistive device: Walker-rolling   Wheelchair     Assist Is the patient using a wheelchair?: Yes Type of Wheelchair: Manual    Wheelchair assist level: Supervision/Verbal cueing Max wheelchair distance: 150    Wheelchair 50 feet with 2 turns activity    Assist        Assist Level: Supervision/Verbal cueing   Wheelchair 150 feet activity     Assist      Assist Level: Supervision/Verbal cueing   Blood pressure (!) 164/77, pulse 92, temperature 98.4 F (36.9 C), temperature source Oral, resp. rate 18, height 5' 4 (1.626 m), weight 57.9 kg, SpO2 97%.  Medical Problem List and Plan: 1. 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  Antithrombotics: -DVT/anticoagulation:  Pharmaceutical: Lovenox              -antiplatelet therapy: Aspirin  81 mg daily and Plavix  75 mg daily x 3 months then Plavix  alone 3. Pain Management/fibromyalgia: Cymbalta  30 mg twice daily, oxycodone  5 mg every 6 hours as needed  -11/28 pt was on lyrica  PTA, 25mg  bid--resumed for bilateral leg pain,FMS 4. Mood/Behavior/Sleep: Trazodone  25 mg nightly, Klonopin  0.25 mg daily as needed anxiety, melatonin 5 mg nightly as needed sleep             -antipsychotic agents: N/A   5. Neuropsych/cognition: This patient is not capable of making decisions on her own behalf.  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 Skin/Wound Care: Routine skin checks 7.  Fluids/Electrolytes/Nutrition: Po intake remains poor  -appreciate RD consult and recs. Discussed importance with pt today  -encourage supplements, eat anything she likes  -11/30 began peractin yesterday. Did eat a bit more     -continue to push po   -labs in am 8.  Seizure prophylaxis.  Keppra  500 mg twice daily.  EEG negative 9.  COPD/asthma.  Quit smoking 35 years ago.  Continue inhalers as directed 10.  Hyperlipidemia.  Crestor  11.  Rheumatoid arthritis.Arava  20 mg daily 12.  GERD.  Protonix   13. Constipation -   - KUB moderate stool burden 11.25  -LBM 11/29 x 2 T1  -increase senokot-s to 2 tab bid  LOS: 5 days A FACE TO FACE EVALUATION WAS PERFORMED  Arthea ONEIDA Gunther 06/27/2024, 9:16 AM

## 2024-06-27 NOTE — Progress Notes (Signed)
 Occupational Therapy Session Note  Patient Details  Name: Betty Golden MRN: [id number] Date of Birth: [birth year]  Today's Date: 06/27/2024 OT Individual Time: 1400-1445 OT Individual Time Calculation (min): 45 min    Short Term Goals: Week 1:  OT Short Term Goal 1 (Week 1): STGs = LTGs  Skilled Therapeutic Interventions/Progress Updates:      Therapy Documentation Precautions:  Precautions Precautions: Fall Precaution/Restrictions Comments: aphasia at baseline, watch HR Restrictions Weight Bearing Restrictions Per Provider Order: No General:  Pt supine in bed upon OT arrival, agreeable to OT session.  Pain: no pain reported  ADL: OT providing skilled intervention on ADL retraining in order to increase independence with tasks and increase activity tolerance. Pt completed the following tasks at the current level of assist: Bed mobility: Min A overall, supine><EOB, sup from supine>EOB with increased time, pt requiring assistance for BLEs back into bed after session Grooming/oral hygiene: standing unsupported at sink at CGA to fix hair with no LOB Transfers: Min A ambulating from room>therapy gym, VC required to stand closer to RW  Balance: Pt ambulated  throughout therapy gym with RW  at [place] A level to complete scavenger hunt to retrieve items. Pt retrieved 10/12 without VC, 2 items required VC to locate. OT challenging pt to retrieve items from different heights, OT also assisting pt on reaching at floor at Min A to retrieve wash cloth. OT then challenging pt to stand unsupported to fold washcloths. Pt completing in with CGA with VC for posture. Pt completed activity in order to increase dynamic standing balance and endurance.   Exercises: Pt completed 8 minutes of nu step bike in order to increase BUE/BLEstrength and endurance in preparation for increased independence in ADLs such as functional mobility and LB ADLs. Rest break after 5 min, on level 4 resistance.  Pt supine in  bed with bed alarm activated, 2 bed rails up, call light within reach and 4Ps assessed.   Therapy/Group: Individual Therapy  Camie Hoe, OTD, OTR/L 06/27/2024, 3:50 PM

## 2024-06-27 NOTE — Progress Notes (Signed)
 Physical Therapy Session Note  Patient Details  Name: Betty Golden MRN: [id number] Date of Birth: July 01, 1946  Today's Date: 06/27/2024 PT Individual Time: 1115-1157 PT Individual Time Calculation (min): 42 min   Short Term Goals: Week 1:  PT Short Term Goal 1 (Week 1): STG+ LTG 2/2 ELOS  Skilled Therapeutic Interventions/Progress Updates:      Pt in bed sleeping to start - awakens to loud voice and is in agreement to therapy treatment.   Supine<>Sitting EOB with supervision with ++ time while HOB was raised ~15*.   Sit<>stand to RW with CGA with cues for hand placement. She has poor safety awareness while abandoning RW while attempting to reach for her iPad on the table, as she was wanting to charge it while she was in therapy treatment. Spent time discussing falls risk, safety awareness, and importance of her balance > iPad. Pt voices understanding but question her carryover.   She ambulated with CGA/minA and RW from her room to the day room gym, ~150'. Cues for upright posture, increasing her gait speed, and keeping her body within walker frame while using the green TB as a visual cue.   Pt instructed in 2x10 sit<>stands from slightly elevated mat table, while holding a ball at chest height with both UE to isolate legs for strengthening and to encourage balance with initial standing. Pt with several failed attempts with LOB posteriorly and needing cues to tuck her BLE underneath her more before attempting to stand. Continued to address standing balance with 2x10 overhead ball reaching with unsupported position and CGA for balance. Pt continues to have posterior bias and LOB frequently in unsupported standing position. Her standing posture is flexed at the ankles, knees, and hips.   Finished session on seated stepper (kinetron) for x5 minutes at L40cm/sec resistance. Encouraged full AROM bilaterally and keeping cadence to challenge her stamina.   Ambulated back to her room ~150'  with CGA and RW with similar cues as above, more emphasis on speed and keeping proximity to RW. Ended session in bed (encouraged her to stay sitting up for lunch but she deferred). Alarm on, needs met.   Therapy Documentation Precautions:  Precautions Precautions: Fall Precaution/Restrictions Comments: aphasia at baseline, watch HR Restrictions Weight Bearing Restrictions Per Provider Order: No General:    Balance:    Therapy/Group: Individual Therapy  [name] 06/27/2024, 7:38 AM

## 2024-06-27 NOTE — Progress Notes (Signed)
 Last void at 2200. Up to [facility], small void and mucous BM. Bladder scan since small volume. Bladder scan=471. Assisted patient to bathroom, voided unknown amount, bladder scan=230. Betty Golden

## 2024-06-28 ENCOUNTER — Ambulatory Visit

## 2024-06-28 DIAGNOSIS — G459 Transient cerebral ischemic attack, unspecified: Secondary | ICD-10-CM

## 2024-06-28 DIAGNOSIS — I69319 Unspecified symptoms and signs involving cognitive functions following cerebral infarction: Secondary | ICD-10-CM | POA: Diagnosis not present

## 2024-06-28 DIAGNOSIS — I69320 Aphasia following cerebral infarction: Secondary | ICD-10-CM | POA: Diagnosis not present

## 2024-06-28 DIAGNOSIS — I69398 Other sequelae of cerebral infarction: Secondary | ICD-10-CM | POA: Diagnosis not present

## 2024-06-28 DIAGNOSIS — I639 Cerebral infarction, unspecified: Secondary | ICD-10-CM | POA: Diagnosis not present

## 2024-06-28 LAB — URINALYSIS, W/ REFLEX TO CULTURE (INFECTION SUSPECTED)
Bilirubin Urine: NEGATIVE
Glucose, UA: NEGATIVE mg/dL
Hgb urine dipstick: NEGATIVE
Ketones, ur: NEGATIVE mg/dL
Nitrite: NEGATIVE
Protein, ur: NEGATIVE mg/dL
Specific Gravity, Urine: 1.013 (ref 1.005–1.030)
WBC, UA: >50 WBC/hpf (ref 0–5)
pH: 6 (ref 5.0–8.0)

## 2024-06-28 LAB — CBC
HCT: 33.5 % — ABNORMAL LOW (ref 36.0–46.0)
Hemoglobin: 10.9 g/dL — ABNORMAL LOW (ref 12.0–15.0)
MCH: 28.2 pg (ref 26.0–34.0)
MCHC: 32.5 g/dL (ref 30.0–36.0)
MCV: 86.6 fL (ref 80.0–100.0)
Platelets: 260 K/uL (ref 150–400)
RBC: 3.87 MIL/uL (ref 3.87–5.11)
RDW: 14.8 % (ref 11.5–15.5)
WBC: 15.6 K/uL — ABNORMAL HIGH (ref 4.0–10.5)
nRBC: 0 % (ref 0.0–0.2)

## 2024-06-28 LAB — BASIC METABOLIC PANEL WITH GFR
Anion gap: 6 (ref 5–15)
BUN: 12 mg/dL (ref 8–23)
CO2: 28 mmol/L (ref 22–32)
Calcium: 9.4 mg/dL (ref 8.9–10.3)
Chloride: 107 mmol/L (ref 98–111)
Creatinine, Ser: 0.87 mg/dL (ref 0.44–1.00)
GFR, Estimated: >60 mL/min (ref >60)
Glucose, Bld: 93 mg/dL (ref 70–99)
Potassium: 3 mmol/L — ABNORMAL LOW (ref 3.5–5.1)
Sodium: 141 mmol/L (ref 135–145)

## 2024-06-28 LAB — CUP PACEART REMOTE DEVICE CHECK
Date Time Interrogation Session: 20251130045827
Pulse Gen Model: 5000
Pulse Gen Serial Number: 511088455

## 2024-06-28 MED ORDER — POTASSIUM CHLORIDE CRYS ER 20 MEQ PO TBCR
20.0000 meq | EXTENDED_RELEASE_TABLET | Freq: Two times a day (BID) | ORAL | Status: AC
  Administered 2024-06-28 – 2024-06-29 (×4): 20 meq via ORAL
  Filled 2024-06-28 (×4): qty 1

## 2024-06-28 NOTE — Progress Notes (Signed)
 Speech Language Pathology Daily Session Note  Patient Details  Name: Betty Golden MRN: [id number] Date of Birth: 1946/04/05  Today's Date: 06/28/2024 SLP Individual Time: 1400-1445 SLP Individual Time Calculation (min): 45 min  Short Term Goals: Week 1: SLP Short Term Goal 1 (Week 1): Patient will verbally communicate wants/needs with min multimodal A SLP Short Term Goal 2 (Week 1): Patient will answer complex yes/no questions with 80% accuracy given min multimodal A SLP Short Term Goal 3 (Week 1): Patient will consume least restrictive diet with use of compensatory strategies given supervision A  Skilled Therapeutic Interventions: Skilled therapy session focused on communication goals. SLP facilitated session by prompting patient to participate in phrase completion task. Patient required minA to participate in phrase completion task secondary to aphasia with intermittent spelling errors when writing. 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 Patient requires modA to decrease rate of speech and utilize pacing/easy onset to increase fluency. Patient left in bed with alarm set and call bell in reach. Continue POC.  Pain None reported  Therapy/Group: Individual Therapy  Marian Grandt M.A., CCC-SLP 06/28/2024, 7:54 AM

## 2024-06-28 NOTE — Progress Notes (Signed)
 Physical Therapy Session Note  Patient Details  Name: Betty Golden MRN: [id number] Date of Birth: 1946-06-17  Today's Date: 06/28/2024 PT Individual Time: 8384-8343 PT Individual Time Calculation (min): 41 min   Short Term Goals: Week 1:  PT Short Term Goal 1 (Week 1): STG+ LTG 2/2 ELOS  Skilled Therapeutic Interventions/Progress Updates:      Pt presents in bed to start, she's agreeable to therapy treatment and has no complaints of pain. Supine<>sitting EOB with supervision assist. Donned tennis shoes with setupA as she sat EOB. Completed stand pivot transfer from bed to w/c with CGA/minA with cues for setup and sequencing.   Pt transported to main gym for energy conservation at the w/c level.   Focused 1st part of session on initiating stair training with the 6 steps and 1 hand rail on her L (simulating home setup). Pt able to navigate x12 + x12 (seated rest break) with CGA/minA. She completes this by side stepping both directions up and down with 2 hands on 1 rail. She had x1 misstep on the last step where she needed minA for balance recovery.   Pt instructed in gait training using the Shopping Cart as AD for UE support and balance. Pt ambulating 3x125' with CGA/minA - cues for upright, safety awareness, and forward gaze.   Pt returned to her room at conclusion of session - ended treatment sitting in wheelchair with safety measures in place, call bell in lap.   Therapy Documentation Precautions:  Precautions Precautions: Fall Precaution/Restrictions Comments: aphasia at baseline, watch HR Restrictions Weight Bearing Restrictions Per Provider Order: No General:     Therapy/Group: Individual Therapy  [name] 06/28/2024, 4:23 PM

## 2024-06-28 NOTE — Plan of Care (Signed)
  Problem: Consults Goal: RH STROKE PATIENT EDUCATION Description: See Patient Education module for education specifics  Outcome: Progressing Goal: Nutrition Consult-if indicated Outcome: Progressing   Problem: RH BOWEL ELIMINATION Goal: RH STG MANAGE BOWEL WITH ASSISTANCE Description: STG Manage Bowel with mod I Assistance. Outcome: Progressing Goal: RH STG MANAGE BOWEL W/MEDICATION W/ASSISTANCE Description: STG Manage Bowel with Medication with mod I Assistance. Outcome: Progressing   Problem: RH SAFETY Goal: RH STG ADHERE TO SAFETY PRECAUTIONS W/ASSISTANCE/DEVICE Description: STG Adhere to Safety Precautions With cues Assistance/Device. Outcome: Progressing   Problem: RH KNOWLEDGE DEFICIT Goal: RH STG INCREASE KNOWLEDGE OF HYPERTENSION Description: Patient and dtr will be able to manage HTN using educational resources for medications independently Outcome: Progressing Goal: RH STG INCREASE KNOWLEDGE OF STROKE PROPHYLAXIS Description: Patient and dtr will be able to manage secondary risks using educational resources for medications  and dietary modification independently Outcome: Progressing

## 2024-06-28 NOTE — Progress Notes (Signed)
 Patient ID: Betty Golden, female   DOB: 08-Jul-1946, 78 y.o.   MRN: [id number]  Discussed with pt her plans, she asked if could go to a SNF from here. Discussed she is too high level to go to a SNF but is more ALF level and this would be private pay. She reports she has been fighting with her daughter and was looking at her options. She has concerns and does not want to be too much care for daughter. Daughter to come in today and will try to see her when here. Pt aware team conference on Wednesday and will determine discharge date, she is doing well. Will ask PT to work more on stairs since she has a flight to get up to the main house. Have messaged PT regarding this. Pt aware of her berg and high risk to fall at home due to this issue. Therapy team may recommend SBA/CGA at home so will not be such a high fall risk.

## 2024-06-28 NOTE — Progress Notes (Signed)
 PROGRESS NOTE   Subjective/Complaints:  Pt up in bed. Had some dizziness yesterday. Says she feels better today.  Ate a little better yeserday  ROS: Patient denies CP, SOB, N/V/D, mild right ant knee pain    Objective:   No results found.  Recent Labs    06/28/24 0457  WBC 15.6*  HGB 10.9*  HCT 33.5*  PLT 260    Recent Labs    06/28/24 0457  NA 141  K 3.0*  CL 107  CO2 28  GLUCOSE 93  BUN 12  CREATININE 0.87  CALCIUM  9.4     Intake/Output Summary (Last 24 hours) at 06/28/2024 1120 Last data filed at 06/28/2024 0916 Gross per 24 hour  Intake 279.33 ml  Output --  Net 279.33 ml        Physical Exam: Vital Signs Blood pressure (!) 152/75, pulse (!) 109, temperature 98 F (36.7 C), temperature source Oral, resp. rate 18, height 5' 4 (1.626 m), weight 57.9 kg, SpO2 100%.   General: No acute distress Mood and affect are appropriate Heart: Regular rate and rhythm no rubs murmurs or extra sounds Lungs: Clear to auscultation, breathing unlabored, no rales or wheezes Abdomen: Positive bowel sounds, soft nontender to palpation, nondistended Extremities: No clubbing, cyanosis, or edema Skin: No evidence of breakdown, no evidence of rash  Neurological: very alert. No focal cn findings. aphasic with word substitutions although context of language/word finding is improving.  Comprehension appears fairly intact.   Cranial nerves II through XII intact, motor strength is 4+/5 in bilateral deltoid, bicep, tricep, grip, hip flexor, knee extensors, ankle dorsiflexor and plantar flexor Sensory exam normal sensation to light touch and proprioception in bilateral upper and lower extremities Cerebellar exam normal finger to nose to finger as well as heel to shin in bilateral upper and lower extremities Finger to thumb intact  Musculoskeletal: Full range of motion in all 4 extremities. No joint swelling or deformities,  minimal R patellar tendon tenderness   Assessment/Plan: 1. Functional deficits which require 3+ hours per day of interdisciplinary therapy in a comprehensive inpatient rehab setting. Physiatrist is providing close team supervision and 24 hour management of active medical problems listed below. Physiatrist and rehab team continue to assess barriers to discharge/monitor patient progress toward functional and medical goals  Care Tool:  Bathing    Body parts bathed by patient: Right arm, Left arm, Chest, Abdomen, Front perineal area, Buttocks, Face, Left lower leg, Right lower leg, Left upper leg, Right upper leg         Bathing assist Assist Level: Supervision/Verbal cueing     Upper Body Dressing/Undressing Upper body dressing   What is the patient wearing?: Pull over shirt    Upper body assist Assist Level: Supervision/Verbal cueing    Lower Body Dressing/Undressing Lower body dressing      What is the patient wearing?: Underwear/pull up, Pants     Lower body assist Assist for lower body dressing: Contact Guard/Touching assist     Toileting Toileting    Toileting assist Assist for toileting: Contact Guard/Touching assist     Transfers Chair/bed transfer  Transfers assist     Chair/bed transfer assist level:  Contact Guard/Touching assist     Locomotion Ambulation   Ambulation assist      Assist level: Contact Guard/Touching assist Assistive device: Walker-rolling Max distance: 48   Walk 10 feet activity   Assist     Assist level: Contact Guard/Touching assist Assistive device: Walker-rolling   Walk 50 feet activity   Assist Walk 50 feet with 2 turns activity did not occur: Safety/medical concerns         Walk 150 feet activity   Assist Walk 150 feet activity did not occur: Safety/medical concerns         Walk 10 feet on uneven surface  activity   Assist     Assist level: Minimal Assistance - Patient > 75% Assistive device:  Walker-rolling   Wheelchair     Assist Is the patient using a wheelchair?: Yes Type of Wheelchair: Manual    Wheelchair assist level: Supervision/Verbal cueing Max wheelchair distance: 150    Wheelchair 50 feet with 2 turns activity    Assist        Assist Level: Supervision/Verbal cueing   Wheelchair 150 feet activity     Assist      Assist Level: Supervision/Verbal cueing   Blood pressure (!) 152/75, pulse (!) 109, temperature 98 F (36.7 C), temperature source Oral, resp. rate 18, height 5' 4 (1.626 m), weight 57.9 kg, SpO2 100%.  Medical Problem List and Plan: 1. 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  Antithrombotics: -DVT/anticoagulation:  Pharmaceutical: Lovenox              -antiplatelet therapy: Aspirin  81 mg daily and Plavix  75 mg daily x 3 months then Plavix  alone 3. Pain Management/fibromyalgia: Cymbalta  30 mg twice daily, oxycodone  5 mg every 6 hours as needed  -11/28 pt was on lyrica  PTA, 25mg  bid--resumed for bilateral leg pain,FMS 4. Mood/Behavior/Sleep: Trazodone  25 mg nightly, Klonopin  0.25 mg daily as needed anxiety, melatonin 5 mg nightly as needed sleep             -antipsychotic agents: N/A   5. Neuropsych/cognition: This patient is not capable of making decisions on her own behalf.  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 Skin/Wound Care: Routine skin checks 7. Fluids/Electrolytes/Nutrition: Po intake remains  poor  -appreciate RD consult and recs. Discussed importance with pt today  -encourage supplements, eat anything she likes Low K+ 3.0  Supplement KCL 20meq BID x 2 d  8.  Seizure prophylaxis.  Keppra  500 mg twice daily.  EEG negative 9.  COPD/asthma.  Quit smoking 35 years ago.  Continue inhalers as directed 10.  Hyperlipidemia.  Crestor  11.  Rheumatoid arthritis.Arava  20 mg daily 12.  GERD.  Protonix   13. Constipation -   - KUB moderate stool burden 11.25  -LBM 11/29 x 2 T1  -increase senokot-s to  2 tab bid     14.  Leukocytosis without fever    Latest Ref Rng & Units 06/28/2024    4:57 AM 06/23/2024    4:43 AM 06/22/2024    6:21 PM  CBC  WBC 4.0 - 10.5 K/uL 15.6  8.1  10.0   Hemoglobin 12.0 - 15.0 g/dL 89.0  89.5  88.4   Hematocrit 36.0 - 46.0 % 33.5  32.1  34.9   Platelets 150 - 400 K/uL 260  204  239      Has ranged from 8-18K over the last 6 month, will check UA  Seen by Hematology for same 05/15/2022 felt to be related to RA, no obvious flare up at present  LOS: 6 days A FACE TO FACE EVALUATION WAS PERFORMED  Prentice FORBES Compton 06/28/2024, 11:20 AM

## 2024-06-28 NOTE — Progress Notes (Signed)
 Occupational Therapy Session Note  Patient Details  Name: Betty Golden MRN: [id number] Date of Birth: [birth year]  Today's Date: 06/28/2024 OT Individual Time: 1130- 1210 and 1303-1330 OT Individual Time Calculation (min): 40 min and 27 min    Short Term Goals: Week 1:  OT Short Term Goal 1 (Week 1): STGs = LTGs  Skilled Therapeutic Interventions/Progress Updates:    Visit 1: Pain: no c/o pain  Pt received in wc ready for therapy. Pt agreeable to working on ambulation and practice with RW to keep close to her vs pushing it forward. Pt continues to need cues to push up from sitting surface vs on RW.   Pt ambulated with CGA but continues to push walker too far forward,  tried to cue her to keep legs close to band that was placed on the RW.  The cue was only helping slightly.  Had pt sit on mat in dayroom as I obtained a looped yellow band and placed around her upper arms and torso.  Cued pt that if she feels band is pulling on her arms, then the RW is too far forward. Cued pt to keep arms by sides and then she would not feel the band pull. This cue seemed to work better.   Pt ambulated from day room to main gym,  over 150 ft, pt rested and then returned to room.  Pt then needed to toilet,  close S with toileting and transfers.  Pt washed hands at sink and then opted to sit on bed to eat lunch.  Bed alarm on and all needs met.      Visit 2: Pain: no c/o pain   Pt received in bed and I noticed her socks were on backwards. Cued pt to turn socks around but she just took them off, so cued her to put them back on with name of socks facing down so heel would fit correctly.  Pt donned L one easily and then stated she could not to the R one.  Needed cues to problem solve to rotate body to put R foot up on bed and then she was able to don.  Spent quite a bit of time talking about her concerns with discharge as far as managing stairs and not having a phone.  Will let PT know she wants to work on  stairs and will look into other options (life alert) pt could use for emergency purposes.  Will practice having her type on her ipad and making emergency statements like help and 'fire and her address to enable her to use a phone.   Placed elastic shoe laces in shoes.   Pt resting in bed, with all needs met.     Therapy Documentation Precautions:  Precautions Precautions: Fall Precaution/Restrictions Comments: aphasia at baseline, watch HR Restrictions Weight Bearing Restrictions Per Provider Order: No    Pain Assessment Pain Scale: 0-10 Pain Score: 0-No pain ADL: ADL Eating: Set up Grooming: Setup Upper Body Bathing: Supervision/safety Where Assessed-Upper Body Bathing: Shower Lower Body Bathing: Supervision/safety Where Assessed-Lower Body Bathing: Shower Upper Body Dressing: Supervision/safety Where Assessed-Upper Body Dressing: Chair Lower Body Dressing: Minimal assistance (pt able to don underwear and socks but became fatigued and needed A with getting pants over feet, pt able to pull over hips) Where Assessed-Lower Body Dressing: Chair Toileting: Supervision/safety Where Assessed-Toileting: Teacher, Adult Education: Furniture Conservator/restorer Method: Proofreader: Acupuncturist: Administrator, Arts Method: Designer, Industrial/product:  Transfer tub bench, Grab bars    Therapy/Group: Individual Therapy  Chrystel Barefield 06/28/2024, 12:46 PM

## 2024-06-28 NOTE — Group Note (Addendum)
 Patient Details Name: Betty Golden MRN: [id number] DOB: September 29, 1945 Today's Date: 06/28/2024  Time Calculation:   PT Group Time Calculation PT Group Start Time: 1000 PT Group Stop Time: 1100 PT Group Time Calculation (min): 60 min    Group Description: LE Group: Pt participated in LE therapeutic exercise group to address functional strengthening, endurance, and ROM to increase overall functional independence with mobility in a social setting.   Pt participated in circuit training with x4 stations: Nustep (legs only) with double hills Seated BLE strengthening (heel raises, LAQ, hip abd/add, knee marching) Seated stretching (hip abd/add, hamstring, heel cords, figure-4, ankle DF's) Mat table BLE strengthening (bridges, heel slides, hip abd/add, SLR, glut sets)  Assist for positioning as needed, consistent guidance and cueing for correct form, sequencing, and dosage. All activities completed for individualized treatment and adjustments made as needed.   Individual level documentation: Supine: Heel slides: 12 reps x 2 sets BLE Hip abduction/adduction: 12 reps x 2 sets BLE SLR: 10 reps x 2 sets BLE SAQ: 12 reps x 2 sets BLE  Bridges:  2 reps x 2 sets BLE Seated: LAQ: 15 reps x 2 sets BLE Seated marches: 15 reps x 2 sets BLE  Seated hip isometric adduction: 15 reps x 2 sets BLE with 5 sec hold Seated hip isometric abduction: 15 reps x 2 sets BLE with 5 sec hold   Pain: Pain Assessment Pain Scale: 0-10 Pain Score: 0-No pain  Precautions:     Betty Golden P Krystian Ferrentino 06/28/2024, 11:21 AM

## 2024-06-29 ENCOUNTER — Inpatient Hospital Stay (HOSPITAL_COMMUNITY)

## 2024-06-29 LAB — COMPREHENSIVE METABOLIC PANEL WITH GFR
ALT: 12 U/L (ref 0–44)
AST: 17 U/L (ref 15–41)
Albumin: 2.8 g/dL — ABNORMAL LOW (ref 3.5–5.0)
Alkaline Phosphatase: 62 U/L (ref 38–126)
Anion gap: 8 (ref 5–15)
BUN: 12 mg/dL (ref 8–23)
CO2: 24 mmol/L (ref 22–32)
Calcium: 9.1 mg/dL (ref 8.9–10.3)
Chloride: 106 mmol/L (ref 98–111)
Creatinine, Ser: 0.87 mg/dL (ref 0.44–1.00)
GFR, Estimated: >60 mL/min (ref >60)
Glucose, Bld: 96 mg/dL (ref 70–99)
Potassium: 3.4 mmol/L — ABNORMAL LOW (ref 3.5–5.1)
Sodium: 138 mmol/L (ref 135–145)
Total Bilirubin: 0.4 mg/dL (ref 0.0–1.2)
Total Protein: 5.8 g/dL — ABNORMAL LOW (ref 6.5–8.1)

## 2024-06-29 MED ORDER — SENNOSIDES-DOCUSATE SODIUM 8.6-50 MG PO TABS
1.0000 | ORAL_TABLET | Freq: Two times a day (BID) | ORAL | Status: DC
  Administered 2024-06-29: 1 via ORAL
  Filled 2024-06-29 (×2): qty 1

## 2024-06-29 MED ORDER — ONDANSETRON HCL 4 MG PO TABS
4.0000 mg | ORAL_TABLET | Freq: Three times a day (TID) | ORAL | Status: DC | PRN
  Administered 2024-06-29 – 2024-06-30 (×2): 4 mg via ORAL
  Filled 2024-06-29 (×2): qty 1

## 2024-06-29 NOTE — Progress Notes (Addendum)
 PROGRESS NOTE   Subjective/Complaints:  Vomited this am , + nausea did not eat breakfast, appetite has been poor for several days, , finally having BMs after senna S increased to 2 tab BID   ROS: Patient denies CP, SOB, N/V/D, mild right ant knee pain    Objective:   No results found.  Recent Labs    06/28/24 0457  WBC 15.6*  HGB 10.9*  HCT 33.5*  PLT 260    Recent Labs    06/28/24 0457  NA 141  K 3.0*  CL 107  CO2 28  GLUCOSE 93  BUN 12  CREATININE 0.87  CALCIUM  9.4     Intake/Output Summary (Last 24 hours) at 06/29/2024 0914 Last data filed at 06/29/2024 0900 Gross per 24 hour  Intake 952 ml  Output --  Net 952 ml        Physical Exam: Vital Signs Blood pressure (!) 157/101, pulse (!) 105, temperature 98.3 F (36.8 C), temperature source Oral, resp. rate 20, height 5' 4 (1.626 m), weight 57.9 kg, SpO2 98%.   General: No acute distress Mood and affect are appropriate Heart: Regular rate and rhythm no rubs murmurs or extra sounds Lungs: Clear to auscultation, breathing unlabored, no rales or wheezes Abdomen: Positive bowel sounds, mildly tender to palpation in all 4 quads , mildly distended  Extremities: No clubbing, cyanosis, or edema Skin: No evidence of breakdown, no evidence of rash  Neurological: very alert. No focal cn findings. aphasic with word substitutions although context of language/word finding is improving.  Comprehension appears fairly intact.  Sitting balance is good   Cranial nerves II through XII intact, motor strength is 4+/5 in bilateral deltoid, bicep, tricep, grip, hip flexor, knee extensors, ankle dorsiflexor and plantar flexor Neuro:  Eyes without evidence of nystagmus  Tone is normal without evidence of spasticity  No evidence of trunkal ataxia   Cranial nerves II- Visual fields are intact to confrontation testing, no blurring of vision III- no evidence of ptosis,  upward, downward and medial gaze intact IV- no vertical diplopia or head tilt V- no facial numbness or masseter weakness VI- no pupil abduction weakness VII- no facial droop, good lid closure VII- normal auditory acuity IX- no pharygeal weakness, gag nl X- no pharyngeal weakness, no hoarseness XI- no trap or SCM weakness XII- no glossal weakness   Cerebellar exam normal finger to nose to finger Finger to thumb intact  Musculoskeletal: Full range of motion in all 4 extremities. No joint swelling or deformities  Assessment/Plan: 1. Functional deficits which require 3+ hours per day of interdisciplinary therapy in a comprehensive inpatient rehab setting. Physiatrist is providing close team supervision and 24 hour management of active medical problems listed below. Physiatrist and rehab team continue to assess barriers to discharge/monitor patient progress toward functional and medical goals  Care Tool:  Bathing    Body parts bathed by patient: Right arm, Left arm, Chest, Abdomen, Front perineal area, Buttocks, Face, Left lower leg, Right lower leg, Left upper leg, Right upper leg         Bathing assist Assist Level: Supervision/Verbal cueing     Upper Body Dressing/Undressing Upper body dressing  What is the patient wearing?: Pull over shirt    Upper body assist Assist Level: Supervision/Verbal cueing    Lower Body Dressing/Undressing Lower body dressing      What is the patient wearing?: Underwear/pull up, Pants     Lower body assist Assist for lower body dressing: Contact Guard/Touching assist     Toileting Toileting    Toileting assist Assist for toileting: Supervision/Verbal cueing     Transfers Chair/bed transfer  Transfers assist     Chair/bed transfer assist level: Contact Guard/Touching assist     Locomotion Ambulation   Ambulation assist      Assist level: Contact Guard/Touching assist Assistive device: Walker-rolling Max distance: 48    Walk 10 feet activity   Assist     Assist level: Contact Guard/Touching assist Assistive device: Walker-rolling   Walk 50 feet activity   Assist Walk 50 feet with 2 turns activity did not occur: Safety/medical concerns         Walk 150 feet activity   Assist Walk 150 feet activity did not occur: Safety/medical concerns         Walk 10 feet on uneven surface  activity   Assist     Assist level: Minimal Assistance - Patient > 75% Assistive device: Walker-rolling   Wheelchair     Assist Is the patient using a wheelchair?: Yes Type of Wheelchair: Manual    Wheelchair assist level: Supervision/Verbal cueing Max wheelchair distance: 150    Wheelchair 50 feet with 2 turns activity    Assist        Assist Level: Supervision/Verbal cueing   Wheelchair 150 feet activity     Assist      Assist Level: Supervision/Verbal cueing   Blood pressure (!) 157/101, pulse (!) 105, temperature 98.3 F (36.8 C), temperature source Oral, resp. rate 20, height 5' 4 (1.626 m), weight 57.9 kg, SpO2 98%.  Medical Problem List and Plan: 1. 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  Antithrombotics: -DVT/anticoagulation:  Pharmaceutical: Lovenox              -antiplatelet therapy: Aspirin  81 mg daily and Plavix  75 mg daily x 3 months then Plavix  alone 3. Pain Management/fibromyalgia: Cymbalta  30 mg twice daily, oxycodone  5 mg every 6 hours as needed  -11/28 pt was on lyrica  PTA, 25mg   bid--resumed for bilateral leg pain,FMS 4. Mood/Behavior/Sleep: Trazodone  25 mg nightly, Klonopin  0.25 mg daily as needed anxiety, melatonin 5 mg nightly as needed sleep             -antipsychotic agents: N/A   5. Neuropsych/cognition: This patient is not capable of making decisions on her own behalf.  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 Skin/Wound Care: Routine skin checks 7. Fluids/Electrolytes/Nutrition: Po intake remains poor  -appreciate RD consult and recs. Discussed  importance with pt today  -encourage supplements, eat anything she likes Low K+ 3.0  Supplement KCL 20meq BID x 2 d  8.  Seizure prophylaxis.  Keppra  500 mg twice daily.  EEG negative 9.  COPD/asthma.  Quit smoking 35 years ago.  Continue inhalers as directed 10.  Hyperlipidemia.  Crestor  11.  Rheumatoid arthritis.Arava  20 mg daily 12.  GERD.  Protonix   13. Constipation -   - KUB moderate stool burden 11.25  -LBM 11/29 x 2 T1  -increase senokot-s to 2 tab bid- now with multiple BMs and nausea will reduce to 1 tab BID CHeck KUB      14.  Leukocytosis without fever    Latest Ref Rng & Units 06/28/2024    4:57 AM 06/23/2024    4:43 AM 06/22/2024    6:21 PM  CBC  WBC 4.0 - 10.5 K/uL 15.6  8.1  10.0   Hemoglobin 12.0 - 15.0 g/dL 89.0  89.5  88.4   Hematocrit 36.0 - 46.0 % 33.5  32.1  34.9   Platelets 150 - 400 K/uL 260  204  239      Has ranged from 8-18K over the last 6 month, will check UA  Seen by Hematology for same 05/15/2022 felt to be related to RA, no obvious flare up at present   15.  Abd discomfort nausea and vomiting , no new neuro signs, GI distress related toi constipation , recheck KUB to see if pt developing ileus although abd exam is benign , add simethicone  Will also check CMET  LOS: 7 days A FACE TO FACE EVALUATION WAS PERFORMED  Prentice FORBES Compton 06/29/2024, 9:14 AM

## 2024-06-29 NOTE — Progress Notes (Signed)
 Physical Therapy Session Note  Patient Details  Name: Betty Golden MRN: [id number] Date of Birth: 07/06/1946  Today's Date: 06/29/2024  Short Term Goals: Week 1:  PT Short Term Goal 1 (Week 1): STG+ LTG 2/2 ELOS  Pt missed a total of 90 min of skilled therapy due to increase in nausea/vomiting. Pt was scheduled for 2 PT sessions with this PTA. Pt politely declined therapy services this day in order to rest (nsg encouraged rest as pt has been feeling unwell since earlier in morning and to avoid overexertion). Will re-attempt as schedule and pt availability permits.       Therapy Documentation Precautions:  Precautions Precautions: Fall Precaution/Restrictions Comments: aphasia at baseline, watch HR Restrictions Weight Bearing Restrictions Per Provider Order: No  Therapy/Group: Individual Therapy  [name] PTA 06/29/2024, 7:53 AM

## 2024-06-29 NOTE — Progress Notes (Signed)
 Pt c/o n/v with one episode of vomiting noted. Writer was unable to assess amount of color of vomitus d/t pt being cleaned up prior to writer arriving to assess. Tech describes  vomitus amount as being small in volume and yellow to brown in color. Pt denies dizziness or headache at this time. Vitals obtained.

## 2024-06-29 NOTE — Progress Notes (Signed)
 Patient continues to report nausea. Unable to participate with OT treatment, but patient reports she hopes to feel better so she can participate later in the day.

## 2024-06-29 NOTE — Progress Notes (Signed)
 Nutrition Follow Up  DOCUMENTATION CODES:   Severe malnutrition in context of acute illness/injury  INTERVENTION:  Continue Ensure Plus High Protein po TID, each supplement provides 350 kcal and 20 grams of protein  Continue Magic cup BID with meals, each supplement provides 290 kcal and 9 grams of protein  Encourage good PO intake Suggested small, frequent meals and taking bites of all items on tray at each meal to help with intake  Pt may benefit from appetite stimulant given pt reports that she does not feel like she has any hunger cues since previous stroke  NUTRITION DIAGNOSIS:  Severe Malnutrition related to acute illness as evidenced by mild fat depletion, moderate muscle depletion, percent weight loss, severe muscle depletion. Remains applicable  GOAL:  Patient will meet greater than or equal to 90% of their needs Progressing  MONITOR:  PO intake, Supplement acceptance, I & O's, Labs, Weight trends  REASON FOR ASSESSMENT:  Consult Poor PO  ASSESSMENT:  78 y.o. female admitted to CIR after a fall at home and acute CVA. PMH includes COPD, HTN, GERD, fibromyalgia, and rheumatoid arthritis.   11/25 - Admitted  Spoke with pt who was resting in bed at time of assessment. Pt reports appetite continues to be poor, states she feels like she does not have hunger cues and never feels hungry. Pt experienced n/v this morning but medication seemed to help. Pt may benefit from appetite stimulant given lack of hunger cues at the present time. Pt endorses drinking all 3 Ensures per day and states it is easier for her to drink than eat, will continue current order. Encouraged pt to eat small, frequent meals and utilize floor snacks to mimic small, frequent meals. Encouraged bites of all foods on trays at each meal and to continue to work hard on improving intake. Pt agreeable to recommendations and does not understand why she has not been hungry since previous stroke.   Pt previously  dealing with constipation, but bowel regimen has worked to help. Appetite may improve now that pt is having movements, but will continue to monitor.   Meal Intake 11/26-11/28: 10% average intake x 5 recorded meals 11/29-12/2: 16% average intake x 8 recorded meals Ensure: 3 per day  Admission/Current Weight: 57.9 kg  Nutrition Related Medications: Protonix , MVI w/ minerals, potassium chloride  20 mEq, senna   Labs: Potassium 3.4    Diet Order:   Diet Order             Diet regular Fluid consistency: Thin  Diet effective now                  EDUCATION NEEDS:  Education needs have been addressed  Skin:  Skin Assessment: Reviewed RN Assessment  Last BM:  multiple type 6 12/2  Height:  Ht Readings from Last 1 Encounters:  06/22/24 5' 4 (1.626 m)   Weight:  Wt Readings from Last 1 Encounters:  06/22/24 57.9 kg   Ideal Body Weight:  54.6 kg  BMI:  Body mass index is 21.91 kg/m.  Estimated Nutritional Needs:  Kcal:  1700-1900 Protein:  80-100 grams Fluid:  >/= 1.7 L    Josette Glance, MS, RDN, LDN Clinical Dietitian I Please reach out via secure chat

## 2024-06-29 NOTE — Progress Notes (Signed)
 Occupational Therapy Session Note  Patient Details  Name: Betty Golden MRN: [id number] Date of Birth: [birth year]  Today's Date: 06/29/2024 OT Missed Time: 60 Minutes Missed Time Reason: Patient ill (comment)   Short Term Goals: Week 1:  OT Short Term Goal 1 (Week 1): STGs = LTGs  Skilled Therapeutic Interventions/Progress Updates:    Therapy staff reported to me that pt had been vomitting earlier and unable to do therapy sessions.  Went to see pt at her next 11am session time. Pt in bed sleeping. She did open her eyes and stated , I can't do therapy now.  I feel so bad, so weak and sick, not today.   Pt continuing to rest in bed.   Therapy Documentation Precautions:  Precautions Precautions: Fall Precaution/Restrictions Comments: aphasia at baseline, watch HR Restrictions Weight Bearing Restrictions Per Provider Order: No General: General OT Amount of Missed Time: 60 Minutes Vital Signs: Therapy Vitals Temp: 98.3 F (36.8 C) Temp Source: Oral Pulse Rate: (!) 105 Resp: 20 BP: (!) 157/101 Patient Position (if appropriate): Lying Oxygen Therapy SpO2: 98 % O2 Device: Room Air Patient Activity (if Appropriate): In bed          Therapy/Group: Individual Therapy  Saud Bail 06/29/2024, 9:54 AM

## 2024-06-30 DIAGNOSIS — F09 Unspecified mental disorder due to known physiological condition: Secondary | ICD-10-CM

## 2024-06-30 DIAGNOSIS — R41841 Cognitive communication deficit: Secondary | ICD-10-CM

## 2024-06-30 LAB — URINE CULTURE: Culture: 30000 — AB

## 2024-06-30 MED ORDER — SENNOSIDES-DOCUSATE SODIUM 8.6-50 MG PO TABS: 1.0000 | ORAL_TABLET | Freq: Every evening | ORAL | Status: DC | PRN

## 2024-06-30 MED ORDER — ADULT MULTIVITAMIN W/MINERALS CH: 1.0000 | ORAL_TABLET | Freq: Every day | ORAL | Status: AC

## 2024-06-30 NOTE — Progress Notes (Signed)
 Speech Language Pathology Weekly Progress and Session Note  Patient Details  Name: Betty Golden MRN: [id number] Date of Birth: 1946-03-13  Beginning of progress report period: June 23, 2024 End of progress report period: June 30, 2024  Today's Date: 06/30/2024 SLP Individual Time: 0900-1000 SLP Individual Time Calculation (min): 60 min  Short Term Goals: Week 1: SLP Short Term Goal 1 (Week 1): Patient will verbally communicate wants/needs with min multimodal A SLP Short Term Goal 1 - Progress (Week 1): Progressing toward goal SLP Short Term Goal 2 (Week 1): Patient will answer complex yes/no questions with 80% accuracy given min multimodal A SLP Short Term Goal 2 - Progress (Week 1): Met SLP Short Term Goal 3 (Week 1): Patient will consume least restrictive diet with use of compensatory strategies given supervision A SLP Short Term Goal 3 - Progress (Week 1): Met    New Short Term Goals: Week 2: SLP Short Term Goal 1 (Week 2): STG = LTG due to ELOS  Weekly Progress Updates: Pt has made good gains and has met 2 of 3 STGs this reporting period due to improved communication and dysphagia. Currently, patient continues to require supervision A for use of swallowing compensatory strategies. Patient required minA for receptive language and up to [facility] for expressive to utilize fluency strategies. Pt/family eduction ongoing. Pt would benefit from continued ST intervention to maximize communication and swallowing in order to maximize functional independence at d/c.    Intensity: Minumum of 1-2 x/day, 30 to 90 minutes Frequency: 3 to 5 out of 7 days Duration/Length of Stay: 10-12 days Treatment/Interventions: Cueing hierarchy;Functional tasks;Internal/external aids;Multimodal communication approach;Patient/family education;Speech/Language facilitation;Therapeutic Activities   Daily Session  Skilled Therapeutic Interventions:  Skilled therapy session focused on dysphagia and  communication goals. SLP facilitated session by observing patient with regular solids/thin liquids. Patient with timely mastication and complete oral clearance. Observed instances of immediate and delayed throat clearing throughout PO. This may be due to bolus misdirection, GERD and/or pharyngeal residuals seconard to osteophytes. Of note, in prior sessions, SLP observed throat clearing without PO - therefore this may be baseline. PO trials were limited due to patient complaints of stomach pain.  Continue current diet. SLP targeted communication goals through receptive and expressive language tasks. Patient answered mildly complex yes/no questions with 97% accuracy independently. SLP then targeted expressive language through word description game. Patient required minA to describe words according to person, place, thing or idea. Patient with continued apraxic errors in which she requires modA to utilize strategies to increase fluency. Patient left in bed with alarm set and call bell in reach. Continue POC.      Pain 7/10 stomach pain  Therapy/Group: Individual Therapy  Reilly Blades M.A., CCC-SLP 06/30/2024, 9:55 AM

## 2024-06-30 NOTE — Consult Note (Signed)
 Neuropsychological Consultation Comprehensive Inpatient Rehab   Patient:   Betty Golden   DOB:   01/12/1946  MR Number:  [id number]  Location:  [facility] [address] CENTER A [address] [place] [address] Dept: [phone] Loc: [phone]           Date of Service:   06/30/2024  Start Time:   2 PM End Time:   3 PM  Provider/Observer:  Norleen Asa, Psy.D.       Clinical Neuropsychologist       Billing Code/Service: [phone]   Reason for Consultation:   Betty Golden is a 78 year old female referred for neuropsychological consultation for coping and adjustment issues in the context of significant family stressors and recent cerebrovascular accident (CVA).  History of Present Illness:  Patient is a 78 year old right-handed female with a history of multiple CVAs. She presented to the hospital on 06/16/2024 after a fall at home, which she states was preceded by dizziness. Reports striking her neck on the hearth of a fireplace. Code stroke was called in triage for acute onset of aphasia. MRI of the brain showed a punctate acute infarct in the right precentral gyrus. She is aware of the recent fall but demonstrates significant word-finding difficulties (expressive aphasia), which she reports has been ongoing since prior strokes but is now more pronounced. She is oriented to place (hospital) and year (2025).  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 Multiple old infarcts noted on imaging.  - Atrial Fibrillation: Status post loop recorder insertion (interrogation on 06/30/2024 showed no events in prior 18 days).  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  - Fibromyalgia  - Seizure prophylaxis: Keppra .  EEG was negative for seizure activity.  - Cardiac: EF 70-75%, degenerative mitral valve changes.  Medications:  Current regimen includes Aspirin  81 mg daily and Plavix  75 mg daily. Patient expresses concern about the number of medications she is taking and their side effects, specifically sedation.  Psychosocial Factors:  Patient reports significant, long-standing interpersonal stressors with her daughter. Core conflicts include:  - Daughter's perceived attempts to socially isolate her by preventing friendships and discouraging phone use. Patient reports this is a lifelong pattern.  - Financial concerns. Patient reports money missing from her bank account, leading to conflict with her daughter. She suspects a possible scam but also expresses distrust around her daughter and new son-in-law. She has recently closed and reopened the account.  - Disagreements over living situation. Patient resides in the basement/garage apartment of her daughter's new home and expresses significant safety concerns regarding her isolation, proximity to a cluttered garage, and inability to safely navigate stairs in an emergency, particularly given medication-related sedation at night.  - Patient feels a loss of autonomy and reports frustration and fatigue with the ongoing conflict.  Behavioral Observations/Mental Status:  Patient was alert and cooperative. Significant anomic aphasia was present, characterized by word-finding difficulty, circumlocution, and paraphasic errors. She expressed frustration with this deficit. Mood appeared euthymic, but affect was notable for frustration related to her communication difficulties and psychosocial stressors. Thought process was linear and goal-directed. Insight into her medical condition and associated deficits appears fair. Judgment may be affected by emotional distress regarding family dynamics.  Impression:  Betty Golden is a 78 year old  female with a complex  history of recurrent CVAs, most recently an acute infarct in the right precentral gyrus, resulting in a significant, chronic expressive aphasia. Her cognitive deficits are compounded by significant psychosocial stressors, primarily related to a conflictual relationship with her daughter, financial worries, and concerns about her living situation. She is struggling with adjustment to her functional limitations and the associated loss of independence.  Plan/Recommendations:  1.  Provided psychoeducation on the nature of aphasia post-stroke, normalizing her frustration with communication deficits. Explained that word-finding issues are a direct result of the brain injury and not a sign of general confusion.  2.  Educated patient on the medical rationale for a multi-medication regimen for secondary stroke prevention. Reassured her that the medical team, led by Dr. Carilyn, is experienced in post-stroke management.  3.  Encouraged patient to continue to trust the medical team regarding her medication regimen. Voiced her concerns about side effects and will communicate these to the primary team.  4.  Validated her feelings of frustration regarding family conflict and loss of autonomy. Attempted to reframe daughter's actions as likely stemming from concern, despite disagreements on methods.  5.  Will continue to follow for supportive counseling regarding adjustment and coping strategies.  6.  Encouraged continued active participation in all therapies (PT, OT, SLP) to maximize functional recovery and safety. Post-discharge strategies will be established.     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 Clinical Neuropsychologist

## 2024-06-30 NOTE — Progress Notes (Signed)
 Occupational Therapy Session Note  Patient Details  Name: SYRINA WAKE MRN: [id number] Date of Birth: [birth year]  Today's Date: 06/30/2024 OT Individual Time: 8497-8454 OT Individual Time Calculation (min): 43 min    Short Term Goals: Week 1:  OT Short Term Goal 1 (Week 1): STGs = LTGs  Skilled Therapeutic Interventions/Progress Updates:   Patient agreeable to participate in OT session. Reports 0/10 pain level.   Patient participated in skilled OT session focusing on functional mobility, balance, activity tolerance. Patient able to complete functional mobility approx 200 ft to gym. OT provided cues on upright standing and standing close to walker. Patient completed upright standing activity placing squigz on high mirror. Patient able to place on high mirror with min cues to mod cues for utilizing b/l UE. Patient transitioned to Nustep to increase functional activity tolerance. Able to complere 3x 3.5 minutes on level 1-3 depending on fatigue level. Patient required functional rest breaks due to decreased activity tolerance. Patient thern returned to room via functional mobility with CGA with RW. Patient returned to bed all needs in reach alarm on.     Therapy Documentation Precautions:  Precautions Precautions: Fall Precaution/Restrictions Comments: aphasia at baseline, watch HR Restrictions Weight Bearing Restrictions Per Provider Order: No    Therapy/Group: Individual Therapy  D'mariea L Aleksa Collinsworth 06/30/2024, 12:25 PM

## 2024-06-30 NOTE — Progress Notes (Signed)
 Physical Therapy Note  Patient Details  Name: FORREST DEMURO MRN: [id number] Date of Birth: 03-06-1946 Today's Date: 06/30/2024    Physical Therapist participated in the interdisciplinary team conference, providing clinical information regarding the patient's current status, treatment goals, and weekly focus, including any barriers that need to be addressed. Please see the Inpatient Rehabilitation Team Conference and Plan of Care Update for further details.    Chanese Hartsough P Yaresly Menzel 06/30/2024, 10:22 AM

## 2024-06-30 NOTE — Progress Notes (Signed)
 PROGRESS NOTE   Subjective/Complaints:  No vomiting , multiple BMs yesterday pm , senna reduced to 1 tab BID but still had loose BMs  ROS: Patient denies CP, SOB, N/V/D, mild right ant knee pain    Objective:   DG Abd 1 View Result Date: 06/29/2024 CLINICAL DATA:  Nausea and vomiting. EXAM: DG ABDOMEN 1V COMPARISON:  Radiographs 06/22/2024.  CT 06/16/2024. FINDINGS: There is a normal nonobstructive bowel gas pattern. Mildly prominent stool in the left colon. No supine evidence of pneumoperitoneum or suspicious abdominal calcification. There are scattered vascular calcifications, multilevel spondylosis and asymmetric right hip degenerative changes. IMPRESSION: No evidence of bowel obstruction or other acute process. Mildly prominent stool in the left colon. Electronically Signed   By: Elsie Perone M.D.   On: 06/29/2024 15:59    Recent Labs    06/28/24 0457  WBC 15.6*  HGB 10.9*  HCT 33.5*  PLT 260    Recent Labs    06/28/24 0457 06/29/24 1050  NA 141 138  K 3.0* 3.4*  CL 107 106  CO2 28 24  GLUCOSE 93 96  BUN 12 12  CREATININE 0.87 0.87  CALCIUM  9.4 9.1     Intake/Output Summary (Last 24 hours) at 06/30/2024 1020 Last data filed at 06/29/2024 1813 Gross per 24 hour  Intake 472 ml  Output --  Net 472 ml        Physical Exam: Vital Signs Blood pressure 127/75, pulse 96, temperature (!) 97.4 F (36.3 C), temperature source Oral, resp. rate 16, height 5' 4 (1.626 m), weight 57.9 kg, SpO2 96%.   General: No acute distress Mood and affect are appropriate Heart: Regular rate and rhythm no rubs murmurs or extra sounds Lungs: Clear to auscultation, breathing unlabored, no rales or wheezes Abdomen: Positive bowel sounds, mildly tender to palpation in all 4 quads , mildly distended  Extremities: No clubbing, cyanosis, or edema Skin: No evidence of breakdown, no evidence of rash  Neurological: very alert. No  focal cn findings. aphasic with word substitutions although context of language/word finding is improving.  Comprehension appears fairly intact.  Sitting balance is good   Cranial nerves II through XII intact, motor strength is 4+/5 in bilateral deltoid, bicep, tricep, grip, hip flexor, knee extensors, ankle dorsiflexor and plantar flexor Neuro:  Eyes without evidence of nystagmus  Tone is normal without evidence of spasticity  No evidence of trunkal ataxia  Cerebellar exam normal finger to nose to finger Finger to thumb intact  Musculoskeletal: Full range of motion in all 4 extremities. No joint swelling or deformities  Assessment/Plan: 1. Functional deficits which require 3+ hours per day of interdisciplinary therapy in a comprehensive inpatient rehab setting. Physiatrist is providing close team supervision and 24 hour management of active medical problems listed below. Physiatrist and rehab team continue to assess barriers to discharge/monitor patient progress toward functional and medical goals  Care Tool:  Bathing    Body parts bathed by patient: Right arm, Left arm, Chest, Abdomen, Front perineal area, Buttocks, Face, Left lower leg, Right lower leg, Left upper leg, Right upper leg         Bathing assist Assist Level: Supervision/Verbal cueing  Upper Body Dressing/Undressing Upper body dressing   What is the patient wearing?: Pull over shirt    Upper body assist Assist Level: Supervision/Verbal cueing    Lower Body Dressing/Undressing Lower body dressing      What is the patient wearing?: Underwear/pull up, Pants     Lower body assist Assist for lower body dressing: Contact Guard/Touching assist     Toileting Toileting    Toileting assist Assist for toileting: Supervision/Verbal cueing     Transfers Chair/bed transfer  Transfers assist     Chair/bed transfer assist level: Contact Guard/Touching assist     Locomotion Ambulation   Ambulation  assist      Assist level: Contact Guard/Touching assist Assistive device: Walker-rolling Max distance: 48   Walk 10 feet activity   Assist     Assist level: Contact Guard/Touching assist Assistive device: Walker-rolling   Walk 50 feet activity   Assist Walk 50 feet with 2 turns activity did not occur: Safety/medical concerns         Walk 150 feet activity   Assist Walk 150 feet activity did not occur: Safety/medical concerns         Walk 10 feet on uneven surface  activity   Assist     Assist level: Minimal Assistance - Patient > 75% Assistive device: Walker-rolling   Wheelchair     Assist Is the patient using a wheelchair?: Yes Type of Wheelchair: Manual    Wheelchair assist level: Supervision/Verbal cueing Max wheelchair distance: 150    Wheelchair 50 feet with 2 turns activity    Assist        Assist Level: Supervision/Verbal cueing   Wheelchair 150 feet activity     Assist      Assist Level: Supervision/Verbal cueing   Blood pressure 127/75, pulse 96, temperature (!) 97.4 F (36.3 C), temperature source Oral, resp. rate 16, height 5' 4 (1.626 m), weight 57.9 kg, SpO2 96%.  Medical Problem List and Plan: 1. 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  Antithrombotics: -DVT/anticoagulation:  Pharmaceutical: Lovenox              -antiplatelet therapy: Aspirin  81 mg daily and Plavix  75 mg daily x 3 months then Plavix  alone 3. Pain Management/fibromyalgia:  Cymbalta  30 mg twice daily, oxycodone  5 mg every 6 hours as needed  -11/28 pt was on lyrica  PTA, 25mg  bid--resumed for bilateral leg pain,FMS 4. Mood/Behavior/Sleep: Trazodone  25 mg nightly, Klonopin  0.25 mg daily as needed anxiety, melatonin 5 mg nightly as needed sleep             -antipsychotic agents: N/A   5. Neuropsych/cognition: This patient is not capable of making decisions on her own behalf.  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 Skin/Wound Care: Routine skin checks 7. Fluids/Electrolytes/Nutrition: Po intake remains poor  -  appreciate RD consult and recs. Discussed importance with pt today  -encourage supplements, eat anything she likes Low K+ 3.0  Supplement KCL 20meq BID x 2 d  8.  Seizure prophylaxis.  Keppra  500 mg twice daily.  EEG negative 9.  COPD/asthma.  Quit smoking 35 years ago.  Continue inhalers as directed 10.  Hyperlipidemia.  Crestor  11.  Rheumatoid arthritis.Arava  20 mg daily 12.  GERD.  Protonix   13. Constipation -   - KUB moderate stool burden 11.25  -LBM 11/29 x 2 T1  -increase senokot-s to 2 tab bid- now with multiple BMs and nausea will reduce to 1 tab BID  KUB is showing less stool only in left colon     14.  Leukocytosis without fever    Latest Ref Rng & Units 06/28/2024    4:57 AM 06/23/2024    4:43 AM 06/22/2024    6:21 PM  CBC  WBC 4.0 - 10.5 K/uL 15.6  8.1  10.0   Hemoglobin 12.0 - 15.0 g/dL 89.0  89.5  88.4   Hematocrit 36.0 - 46.0 % 33.5  32.1  34.9   Platelets 150 - 400 K/uL 260  204  239      Has ranged from 8-18K over the last 6 month, will check UA  Seen by Hematology for same 05/15/2022 felt to be related to RA, no obvious flare up at present   15.  Abd discomfort likely due to freq stooling stop laxative monitor LOS: 8 days A FACE TO FACE EVALUATION WAS PERFORMED  Prentice FORBES Compton 06/30/2024, 10:20 AM

## 2024-06-30 NOTE — Patient Care Conference (Cosign Needed)
 Inpatient RehabilitationTeam Conference and Plan of Care Update Date: 06/30/2024   Time: 10:22 AM    Patient Name: Betty Golden      Medical Record Number: [id number]  Date of Birth: 06/24/1946 Sex: Female         Room/Bed: 4W25C/4W25C-01 Payor Info: Payor: [employer]ICARE / Plan: BCBS MEDICARE / Product Type: *No Product type* /    Admit Date/Time:  06/22/2024  2:10 PM  Primary Diagnosis:  Cardioembolic stroke [facility])  Hospital Problems: Principal Problem:   Cardioembolic stroke [facility]) Active Problems:   Protein-calorie malnutrition, severe   Cognitive and neurobehavioral dysfunction   Cognitive communication disorder    Expected Discharge Date: Expected Discharge Date: 07/02/24  Team Members Present: Physician leading conference: Dr. Prentice Compton Social Worker Present: Betty Clement, LCSW Nurse Present: Betty Ronde, RN PT Present: Betty Golden, PT OT Present: Betty Golden, OT SLP Present: Betty Golden, SLP     Current Status/Progress Goal Weekly Team Focus  Bowel/Bladder   Pt is continent of bowel and bladder with occassional episodes of leakage (LBM 06/29/24)   Pt will manage her bowel and bladder function independently   Pt will maintain continence of Bladder and Bowel while utilizing adult protective underwear for occasional episodes of leakage    Swallow/Nutrition/ Hydration   regular/thin   supervision  tolerance of diet, hx of GERD, large osteophytes    ADL's   CGA - min A with LB dressing, supervision toileting and bathing,  CGA transfers   supervision LB dressing, toilet transfers, mod ind toileting   activity tolerance, balance, ADL training, pt/fam education    Mobility   Bed mobility = independent; Transfers; Supervision with RW; Ambulation = CGA/minA (150'+); Stairs = minA   Supervision  Barriers: Pt with decreased appetite and as of today, increase in vomiting; Focus = NMRE, memory recall, coordinatino, dynamic  standing balance, stairs, pt/family ed    Communication   min-modA   minA   apraxia > aphasia, utilizing fluency and word finding strategies    Safety/Cognition/ Behavioral Observations               Pain   Pt complains of occassional head ache   Pain level will be 3 or less   Utilize po analgesics for c/o pain  to ensure pain level is 3 or less    Skin   Pt skin is intact with no alteration in skin integrity   Pt skin will remain intact with no alteration in skin integrity  Ensure pt remains dry, turns and repositions herself for comfort and skin integrity      Discharge Planning:  Pt having issues with daughter but still plans to go home with her. Made daughter aware she will need SBA due to balance issues and BERG being so low. Doing well until yesterday and then missed some therapies due to illness   Team Discussion: Patient admitted post right gyrus infarct with history of previous CVA, aphasia and mild apraxia. Having trouble swallowing with severe cervical osteophytes; frequently clearing of throat. Limited by multiple bowel movements post laxatives following KUB noting stool burden. Follow up KUB noted improvement. MD monitoring elevated WBC with history of RA flair and new diarrhea.  Patient on target to meet rehab goals: yes, patient is close to her baseline functional status. Needs CGA for upper body care and Min assist for lower body care and supervision for toileting.  Needs CGA for transfers and ambulation and navigating a step with  1 rail.   *See Care Plan and progress notes for long and short-term goals.   Revisions to Treatment Plan:  Loop interrogation: negative Hypokalemia addressed   Teaching Needs: Safety, medications, transfers, toileting, etc.   Current Barriers to Discharge: Decreased caregiver support and Home enviroment access/layout  Possible Resolutions to Barriers: Family education     Medical Summary               I attest  that I was present, lead the team conference, and concur with the assessment and plan of the team.   Betty Golden 06/30/2024, 2:57 PM

## 2024-06-30 NOTE — Plan of Care (Signed)
  Problem: Consults Goal: RH STROKE PATIENT EDUCATION Description: See Patient Education module for education specifics  Outcome: Progressing Goal: Nutrition Consult-if indicated Outcome: Progressing   Problem: RH BOWEL ELIMINATION Goal: RH STG MANAGE BOWEL WITH ASSISTANCE Description: STG Manage Bowel with mod I Assistance. Outcome: Progressing Goal: RH STG MANAGE BOWEL W/MEDICATION W/ASSISTANCE Description: STG Manage Bowel with Medication with mod I Assistance. Outcome: Progressing   Problem: RH SAFETY Goal: RH STG ADHERE TO SAFETY PRECAUTIONS W/ASSISTANCE/DEVICE Description: STG Adhere to Safety Precautions With cues Assistance/Device. Outcome: Progressing   Problem: RH KNOWLEDGE DEFICIT Goal: RH STG INCREASE KNOWLEDGE OF HYPERTENSION Description: Patient and dtr will be able to manage HTN using educational resources for medications independently Outcome: Progressing Goal: RH STG INCREASE KNOWLEDGE OF STROKE PROPHYLAXIS Description: Patient and dtr will be able to manage secondary risks using educational resources for medications  and dietary modification independently Outcome: Progressing

## 2024-06-30 NOTE — Patient Care Conference (Incomplete Revision)
 Inpatient RehabilitationTeam Conference and Plan of Care Update Date: 06/30/2024   Time: 10:22 AM    Patient Name: Betty Golden      Medical Record Number: [id number]  Date of Birth: [birth year] Sex: Female         Room/Bed: 4W25C/4W25C-01 Payor Info: Payor: [employer]ICARE / Plan: BCBS MEDICARE / Product Type: *No Product type* /    Admit Date/Time:  06/22/2024  2:10 PM  Primary Diagnosis:  Cardioembolic stroke [facility])  Hospital Problems: Principal Problem:   Cardioembolic stroke [facility]) Active Problems:   Protein-calorie malnutrition, severe   Cognitive and neurobehavioral dysfunction   Cognitive communication disorder    Expected Discharge Date: Expected Discharge Date: 07/02/24  Team Members Present: Physician leading conference: Dr. Prentice Compton Social Worker Present: Rhoda Clement, LCSW Nurse Present: Barnie Ronde, RN PT Present: Sherlean Perks, PT OT Present: Delon Sharps, OT SLP Present: Blaise Alderman, SLP     Current Status/Progress Goal Weekly Team Focus  Bowel/Bladder   Pt is continent of bowel and bladder with occassional episodes of leakage (LBM 06/29/24)   Pt will manage her bowel and bladder function independently   Pt will maintain continence of Bladder and Bowel while utilizing adult protective underwear for occasional episodes of leakage    Swallow/Nutrition/ Hydration   regular/thin   supervision  tolerance of diet, hx of GERD, large osteophytes    ADL's   CGA - min A with LB dressing, supervision toileting and bathing,  CGA transfers   supervision LB dressing, toilet transfers, mod ind toileting   activity tolerance, balance, ADL training, pt/fam education    Mobility   Bed mobility = independent; Transfers; Supervision with RW; Ambulation = CGA/minA (150'+); Stairs = minA   Supervision  Barriers: Pt with decreased appetite and as of today, increase in vomiting; Focus = NMRE, memory recall, coordinatino, dynamic  standing balance, stairs, pt/family ed    Communication   min-modA   minA   apraxia > aphasia, utilizing fluency and word finding strategies    Safety/Cognition/ Behavioral Observations               Pain   Pt complains of occassional head ache   Pain level will be 3 or less   Utilize po analgesics for c/o pain  to ensure pain level is 3 or less    Skin   Pt skin is intact with no alteration in skin integrity   Pt skin will remain intact with no alteration in skin integrity  Ensure pt remains dry, turns and repositions herself for comfort and skin integrity      Discharge Planning:  Pt having issues with daughter but still plans to go home with her. Made daughter aware she will need SBA due to balance issues and BERG being so low. Doing well until yesterday and then missed some therapies due to illness   Team Discussion: Patient admitted post right gyrus infarct with history of previous CVA, aphasia and mild apraxia. Having trouble swallowing with severe cervical osteophytes; frequently clearing of throat. Limited by multiple bowel movements post laxatives following KUB noting stool burden. Follow up KUB noted improvement. MD monitoring elevated WBC with history of RA flair and new diarrhea.  Patient on target to meet rehab goals: yes, patient is close to her baseline functional status. Needs CGA for upper body care and Min assist for lower body care and supervision for toileting.  Needs CGA for transfers and ambulation and navigating a step with  1 rail.   *See Care Plan and progress notes for long and short-term goals.   Revisions to Treatment Plan:  Loop interrogation: negative Hypokalemia addressed   Teaching Needs: Safety, medications, transfers, toileting, etc.   Current Barriers to Discharge: Decreased caregiver support and Home enviroment access/layout  Possible Resolutions to Barriers: Family education     Medical Summary               I attest  that I was present, lead the team conference, and concur with the assessment and plan of the team.   [name] 06/30/2024, 3:54 PM

## 2024-06-30 NOTE — Progress Notes (Addendum)
 Patient ID: Betty Golden, female   DOB: 05/10/1946, 78 y.o.   MRN: [id number]  Spoke with daughter-Nicole via telephone to inform of goals-supervision and discharge date 12/5. Have scheduled family training for tomorrow from 1;00-3:00 pm. Daughter wants to talk with MD and have messaged MD to call daughter. Once pt returns from therapies will inform her of the update  12:06 PM Met with pt to give team conference update goals of supervision due to history of CVA's and history of falls. Made aware her daughter is coming tomorrow for family education and plan for discharge Friday. Sent information to [facility] to resume home health services. Has all DME

## 2024-06-30 NOTE — Progress Notes (Signed)
 Physical Therapy Session Note  Patient Details  Name: Betty Golden MRN: [id number] Date of Birth: [birth year]  Today's Date: 06/30/2024 PT Individual Time: 0800-0843 PT Individual Time Calculation (min): 43 min   Short Term Goals: Week 1:  PT Short Term Goal 1 (Week 1): STG+ LTG 2/2 ELOS  Skilled Therapeutic Interventions/Progress Updates:    Pt supine asleep in bed upon arrival. Pt agreeable to therapy. Pt endorses 10/10 abdominal pain, nurse present to administer medications.  PT provided rest breaks and repositioning as needed and assisted pt to the bathroom. Notified MD during AM rounds.   PT encouraging fluids during session per MD to assist with hydration and constipation management.   Supine to sit with use of bed features and supervision. Sit to stand with RW and CGA. Ambulation bed <> BSC over toielt with RW and CGA, verbal cues provided for safety with RW with emphasis on proximity.  Pt continent of bladder. Pt donned/doffed pants and performed pericare  with supervision   Pt washed hands/face, brushed teeth and air while standing at sink with close supervision/CGA.   Pt requires seated rest break 2/2 fatigue.   Pt donned shirt/pants/shoes while seated EOB with supervision.   Pt seated EOB at end of session with all needs within reach and bed alarm on.       Therapy Documentation Precautions:  Precautions Precautions: Fall Precaution/Restrictions Comments: aphasia at baseline, watch HR Restrictions Weight Bearing Restrictions Per Provider Order: No   Therapy/Group: Individual Therapy  [facility] [place], [place], DPT  06/30/2024, 8:04 AM

## 2024-06-30 NOTE — Progress Notes (Signed)
 Occupational Therapy participated in the interdisciplinary team conference, providing clinical information regarding the patient's current status, treatment goals, and weekly focus, including any barriers that need to be addressed. Please see the Inpatient Rehabilitation Team Conference and Plan of Care Update for further details.

## 2024-06-30 NOTE — Progress Notes (Signed)
 Occupational Therapy Session Note  Patient Details  Name: Betty Golden MRN: [id number] Date of Birth: Jun 27, 1946  Today's Date: 06/30/2024 OT Individual Time: 8969-8874 OT Individual Time Calculation (min): 55 min    Short Term Goals: Week 1:  OT Short Term Goal 1 (Week 1): STGs = LTGs  Skilled Therapeutic Interventions/Progress Updates:    1:1 Pt received in the bed and reports feeling better today. PT already bathed and dressed for the day. Pt mod I with the bed rail to come to EOB. Pt requested to stay in the room and perform activities. Performed activities to continue to address standing balance, overall strengthening and conditioning  Engaged in shoulder, bicep exercises with 2 lb weighted ball (large circles, chest presses out and up  With 2lb weighted bar - rowing orwards and backwards Sit to stand pushing 2 lb weighted bar forward with controlled decents 5x, rest and then 10 time Stepping forward and backwards to target alternating LEs 12 x Side stepping with tapping toe (narrow base of support and then returning to wide stance 10x  Knee flexion with resistance band in sitting 10x on each LE  Hip abduction with resistance 10x  Hamstring stretch with straight knee reaching down to ankle 10x  Lifting bilateral knees while stabilizing core and performing hip abduction with resistance band 10x  Bicep curl 4 lbs 12x Shoulder external rotation 12x each side   Then functional ambulation with RW 170 feet x2 (walked to the dayroom from her room, sat rested and then returned to her room. Mod I to get back into bed. With call bell.     Therapy Documentation Precautions:  Precautions Precautions: Fall Precaution/Restrictions Comments: aphasia at baseline, watch HR Restrictions Weight Bearing Restrictions Per Provider Order: No General:   Vital Signs:   Pain:   ADL: ADL Eating: Set up Grooming: Setup Upper Body Bathing: Supervision/safety Where Assessed-Upper Body  Bathing: Shower Lower Body Bathing: Supervision/safety Where Assessed-Lower Body Bathing: Shower Upper Body Dressing: Supervision/safety Where Assessed-Upper Body Dressing: Chair Lower Body Dressing: Minimal assistance (pt able to don underwear and socks but became fatigued and needed A with getting pants over feet, pt able to pull over hips) Where Assessed-Lower Body Dressing: Chair Toileting: Supervision/safety Where Assessed-Toileting: Teacher, Adult Education: Furniture Conservator/restorer Method: Proofreader: Acupuncturist: Administrator, Arts Method: Designer, Industrial/product: Emergency planning/management officer, Engineer, Production   Exercises:   Other Treatments:     Therapy/Group: Individual Therapy  [name] [facility] 06/30/2024, 10:54 AM

## 2024-07-01 ENCOUNTER — Other Ambulatory Visit (HOSPITAL_COMMUNITY): Payer: Self-pay

## 2024-07-01 LAB — POTASSIUM: Potassium: 3.5 mmol/L (ref 3.5–5.1)

## 2024-07-01 MED ORDER — DULOXETINE HCL 30 MG PO CPEP
30.0000 mg | ORAL_CAPSULE | Freq: Two times a day (BID) | ORAL | 0 refills | Status: AC
  Filled 2024-07-01: qty 60, 30d supply, fill #0

## 2024-07-01 MED ORDER — TRAZODONE HCL 50 MG PO TABS
25.0000 mg | ORAL_TABLET | Freq: Every day | ORAL | 0 refills | Status: AC
  Filled 2024-07-01: qty 30, 60d supply, fill #0

## 2024-07-01 MED ORDER — MECLIZINE HCL 12.5 MG PO TABS
12.5000 mg | ORAL_TABLET | Freq: Every day | ORAL | 0 refills | Status: AC
  Filled 2024-07-01: qty 30, 30d supply, fill #0

## 2024-07-01 MED ORDER — CLONAZEPAM 0.25 MG PO TBDP
0.2500 mg | ORAL_TABLET | Freq: Two times a day (BID) | ORAL | 0 refills | Status: AC | PRN
  Filled 2024-07-01: qty 60, 30d supply, fill #0

## 2024-07-01 MED ORDER — METOPROLOL SUCCINATE ER 25 MG PO TB24
25.0000 mg | ORAL_TABLET | Freq: Every day | ORAL | 0 refills | Status: AC
  Filled 2024-07-01: qty 30, 30d supply, fill #0

## 2024-07-01 MED ORDER — FERROUS SULFATE 325 (65 FE) MG PO TABS
325.0000 mg | ORAL_TABLET | Freq: Every day | ORAL | 0 refills | Status: AC
  Filled 2024-07-01: qty 30, 30d supply, fill #0

## 2024-07-01 MED ORDER — ROSUVASTATIN CALCIUM 20 MG PO TABS
20.0000 mg | ORAL_TABLET | Freq: Every day | ORAL | 0 refills | Status: AC
  Filled 2024-07-01: qty 30, 30d supply, fill #0

## 2024-07-01 MED ORDER — OXYCODONE HCL 5 MG PO TABS
5.0000 mg | ORAL_TABLET | Freq: Four times a day (QID) | ORAL | 0 refills | Status: AC | PRN
  Filled 2024-07-01: qty 28, 7d supply, fill #0

## 2024-07-01 MED ORDER — LEVETIRACETAM 500 MG PO TABS
500.0000 mg | ORAL_TABLET | Freq: Two times a day (BID) | ORAL | 0 refills | Status: DC
  Filled 2024-07-01: qty 60, 30d supply, fill #0

## 2024-07-01 MED ORDER — PANTOPRAZOLE SODIUM 40 MG PO TBEC
40.0000 mg | DELAYED_RELEASE_TABLET | Freq: Every day | ORAL | 0 refills | Status: AC
  Filled 2024-07-01: qty 30, 30d supply, fill #0

## 2024-07-01 MED ORDER — CLOPIDOGREL BISULFATE 75 MG PO TABS
75.0000 mg | ORAL_TABLET | Freq: Every day | ORAL | 0 refills | Status: AC
  Filled 2024-07-01: qty 90, 90d supply, fill #0

## 2024-07-01 MED ORDER — PREGABALIN 25 MG PO CAPS
25.0000 mg | ORAL_CAPSULE | Freq: Two times a day (BID) | ORAL | 0 refills | Status: AC
  Filled 2024-07-01: qty 60, 30d supply, fill #0

## 2024-07-01 NOTE — Progress Notes (Signed)
 Patient ID: Betty Golden, female   DOB: [birth year], 78 y.o.   MRN: [id number]   Per OT pt needs 3in1 BSC.   1459- SW tried to leave message for [name] on above. *SW received return phone call and informed on DME item being ordered.   Graeme Jude, MSW, LCSW Office: [phone] Cell: [phone] Fax: [phone]

## 2024-07-01 NOTE — Progress Notes (Signed)
 Speech Language Pathology Discharge Summary  Patient Details  Name: DEBERA STERBA MRN: [id number] Date of Birth: 11-Nov-1945  Date of Discharge from SLP service:July 01, 2024  Patient has met 3 of 4 long term goals.  Patient to discharge at overall Min;Mod level.  Reasons goals not met: cont to require modA for use of fluency strategies   Clinical Impression/Discharge Summary: Pt has made good gains and has met 3 of 4 LTG's this admission due to improved dysphagia and communication. Pt is currently an overall minA for communicative tasks and requires supervision cues for utilization of swallowing compensatory strategies to minimize overt s/sx of aspiration with D3/thin diet. 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 Pt/family education complete and pt will discharge home with supervision from friends/family/etc. Pt would benefit from [facility] f/u ST services to maximize expressive communication in order to maximize functional independence.   Care Partner:  Caregiver Able to Provide Assistance: Yes  Type of Caregiver Assistance: Physical;Cognitive  Recommendation:  Home Health SLP  Rationale for SLP Follow Up: Maximize functional communication   Equipment: n/a   Reasons for discharge: Discharged from hospital   Patient/Family Agrees with Progress Made and Goals Achieved: Yes    Lamarr Feenstra M.A., CCC-SLP 07/01/2024, 10:35 AM

## 2024-07-01 NOTE — Progress Notes (Signed)
 Occupational Therapy Session Note  Patient Details  Name: Betty Golden MRN: [id number] Date of Birth: 07/14/1946  Today's Date: 07/01/2024 OT Individual Time: [phone] 8651-8567 OT Individual Time Calculation (min): 58 min    Short Term Goals: Week 1:  OT Short Term Goal 1 (Week 1): STGs = LTGs  Skilled Therapeutic Interventions/Progress Updates:  Session 1: Pt greeted seated EOB with nurse present, pt agreeable to OT intervention.    No pain reported during session.   Transfers/bed mobility/functional mobility:  Pt completed sit>stand to RW MODI.    ADLs:  Grooming: pt able to stand at sink for grooming tasks for > MODI UB dressing:pt donned [place] shirt and bra MODI LB dressing: pt donned pull up briefs from EOB MODI however pt requested assistance to don R sock but able to don L with figure 4 technique.    Bathing: pt completed bathing from sitting on TTB with supervision for safety as pt continues to present with aphasia with pt unable to express that she had dropped the washcloths  Transfers: ambulatory toilet and shower transfer with RW and supervision  Toileting: continent urine void, pt completed 3/3 toileting tasks MODI. Pt later had continent BM, MODI for 3/3 toileting tasks      Ended session with pt supine in bed with all needs within reach and bed alarm activated.                        Session 2: Pt greeted seated EOB with daughter present, pt agreeable to OT intervention.    No pain reported during session.  family education provided to Nicole ( pts daughter) on the below topics:  -recommendation on use of RW for functional ambulation - general assist needed for ADLS and functional mobility ( I.e supervision- MODI) -recommended closer supervision when pt is bathing in shower for safety, mostly d/t speech deficits and pt having a hard time expressing concerns - discussed DME needs, daughter reports small bathroom/shower and that pt has been using  folding chair as their insurance wont cover a shower chair. Educated pt on use of BSC as shower chair that is often covered, pt and daughter agreeable to use BSC as shower seat. Reached out to SW to order. -recommended grab bars be installed in shower for safety, daughter to request from her land lord -education provided on shower transfers to walkin in shower, decided to use HHA when transferring into shower d/t small space. Daughter able to return demo hand held assistance of helping pt in shower.   Family demonstrated appropriate level of assist with ADLs and functional mobility     Ended session with pt supine in bed with all needs within reach.   Therapy Documentation Precautions:  Precautions Precautions: Fall Precaution/Restrictions Comments: aphasia at baseline, watch HR Restrictions Weight Bearing Restrictions Per Provider Order: No    Therapy/Group: Individual Therapy  [name] 07/01/2024, 12:11 PM

## 2024-07-01 NOTE — Progress Notes (Signed)
 Speech Language Pathology Daily Session Note  Patient Details  Name: Betty Golden MRN: [id number] Date of Birth: April 01, 1946  Today's Date: 07/01/2024 SLP Individual Time: 1001-1100 SLP Individual Time Calculation (min): 59 min  Short Term Goals: Week 2: SLP Short Term Goal 1 (Week 2): STG = LTG due to ELOS  Skilled Therapeutic Interventions: Skilled therapy session focused on re-evaluation of expressive/receptive language. 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 Patient is able to complete automatic naming tasks (name, numbers, days of the week, months of the year) independently. Patient recieved 100% accuracy during confrontational naming, responsive naming and phrase completion task. 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 Receptively, patient with 100% accuracy during simple and complex yes/no as well as 1-2 step commands. Accuracy decreased as commands increase in complexity. Overall, patient continues to present with apraxic erros requiring modA to utilize flunecy strategies, however patient with improvements in word finding and semantic/phonemic paraphasias as well as auditory comprehension. At the end of the session, SLP provided minA for patient to name items found in [place] given letters A-Z. Patient left in bed with alarm set and call bell in reach. Continue POC  Pain Pain Assessment Pain Scale: 0-10 Pain Score: 0-No pain  Therapy/Group: Individual Therapy  [name] M.A., CCC-SLP 07/01/2024, 11:01 AM

## 2024-07-01 NOTE — Progress Notes (Signed)
 Inpatient Rehabilitation Discharge Medication Review by a Pharmacist  A complete drug regimen review was completed for this patient to identify any potential clinically significant medication issues.  High Risk Drug Classes Is patient taking? Indication by Medication  Antipsychotic No   Anticoagulant No   Antibiotic Yes Clindamycin gel -acne  Opioid Yes Oxycodone  - pain  Antiplatelet Yes ASA/Clopidogrel  - Stroke  Hypoglycemics/insulin No   Vasoactive Medication Yes Metoprolol  - HTN  Chemotherapy No   Other Yes Apap/lyrica  - pain Trazodone  - sleep Trelegy/albuterol /benralizumab   - asthma Duloxetine  - mood Ferrous sulfate /MVI - supplement Leflunomide /Certolizumab Pegol  - RA Levetiracetam  - Seizure Rosuvastatin   - HLD Clonazepam  - anxiety Colestix - HLD Meclizine  - dizziness Protonix  - GERD     Type of Medication Issue Identified Description of Issue Recommendation(s)  Drug Interaction(s) (clinically significant)     Duplicate Therapy     Allergy     No Medication Administration End Date     Incorrect Dose     Additional Drug Therapy Needed     Significant med changes from prior encounter (inform family/care partners about these prior to discharge).    Other       Clinically significant medication issues were identified that warrant physician communication and completion of prescribed/recommended actions by midnight of the next day:  No  Name of provider notified for urgent issues identified:   Provider Method of Notification:     Pharmacist comments:   Time spent performing this drug regimen review (minutes):  20   Sergio Batch, PharmD, [place], AAHIVP, CPP Infectious Disease Pharmacist 07/01/2024 8:34 AM

## 2024-07-01 NOTE — Progress Notes (Signed)
 Physical Therapy Discharge Summary  Patient Details  Name: Betty Golden MRN: [id number] Date of Birth: [birth year]  Date of Discharge from PT service:July 01, 2024  Patient has met 12 of 12 long term goals due to improved activity tolerance, improved balance, improved postural control, increased strength, improved attention, and improved awareness.  Patient to discharge at an ambulatory level Supervision.   Patient's care partner is independent to provide the necessary physical and cognitive assistance at discharge.  Recommendation:  Patient will benefit from ongoing skilled PT services in home health setting to continue to advance safe functional mobility, address ongoing impairments in bed mobility, functional transfers, standing balance, activity tolerance and endurance, and minimize fall risk.  Equipment: No equipment provided  Reasons for discharge: treatment goals met  Patient/family agrees with progress made and goals achieved: Yes  PT Discharge Precautions/Restrictions Precautions Precautions: Fall Restrictions Weight Bearing Restrictions Per Provider Order: No Pain Pain Assessment Pain Scale: 0-10 Pain Score: 0-No pain Pain Interference Pain Interference Pain Effect on Sleep: 1. Rarely or not at all Pain Interference with Therapy Activities: 1. Rarely or not at all Pain Interference with Day-to-Day Activities: 1. Rarely or not at all Vision/Perception  Vision - History Ability to See in Adequate Light: 0 Adequate Perception Perception: Within Functional Limits Praxis Praxis: WFL  Cognition Overall Cognitive Status: History of cognitive impairments - at baseline Arousal/Alertness: Awake/alert Orientation Level: Oriented X4 Year: 2025 Month: December Day of Week: Correct Attention: Focused;Sustained Focused Attention: Appears intact Sustained Attention: Appears intact Memory: Appears intact Awareness: Appears intact Problem Solving: Appears  intact Sensation Sensation Light Touch: Appears Intact Hot/Cold: Appears Intact Proprioception: Appears Intact Stereognosis: Not tested Coordination Gross Motor Movements are Fluid and Coordinated: No Motor  Motor Motor: Other (comment) Motor - Discharge Observations: generalized weakness  Mobility Bed Mobility Bed Mobility: Supine to Sit;Sit to Supine;Rolling Right;Rolling Left Rolling Right: Independent Rolling Left: Independent Left Sidelying to Sit: Independent Supine to Sit: Independent Sit to Supine: Independent Transfers Transfers: Sit to Stand;Stand to Sit;Stand Pivot Transfers Sit to Stand: Independent with assistive device Stand to Sit: Independent with assistive device Stand Pivot Transfers: Independent with assistive device Transfer (Assistive device): Rolling walker Locomotion  Gait Ambulation: Yes Gait Assistance: Supervision/Verbal cueing Gait Distance (Feet): 150 Feet Assistive device: Rolling walker Gait Gait: Yes Gait Pattern: Impaired Gait Pattern: Trunk flexed;Left flexed knee in stance;Right flexed knee in stance (knee flexed in stance has improved. VC to increase hip extension inside RW assists with increasing B knee extension) Stairs / Additional Locomotion Stairs: Yes Stairs Assistance: Supervision/Verbal cueing Stair Management Technique: One rail Right Number of Stairs: 12 Height of Stairs: 6 Wheelchair Mobility Wheelchair Assistance: Doctor, General Practice: Both upper extremities Wheelchair Parts Management: Needs assistance Distance: 150  Trunk/Postural Assessment  Cervical Assessment Cervical Assessment: Within Functional Limits Thoracic Assessment Thoracic Assessment: Within Functional Limits Lumbar Assessment Lumbar Assessment: Within Functional Limits Postural Control Postural Control: Deficits on evaluation Protective Responses: decreased  Balance Balance Balance Assessed: Yes Static Sitting  Balance Static Sitting - Balance Support: Feet supported Static Sitting - Level of Assistance: 7: Independent Dynamic Sitting Balance Dynamic Sitting - Balance Support: Feet supported Dynamic Sitting - Level of Assistance: 7: Independent Static Standing Balance Static Standing - Balance Support: Bilateral upper extremity supported Static Standing - Level of Assistance: 6: Modified independent (Device/Increase time) Dynamic Standing Balance Dynamic Standing - Balance Support: Left upper extremity supported Dynamic Standing - Level of Assistance: 6: Modified independent (Device/Increase time) Extremity Assessment  RUE Assessment  RUE Assessment: Within Functional Limits LUE Assessment LUE Assessment: Within Functional Limits RLE Assessment RLE Assessment: Within Functional Limits LLE Assessment LLE Assessment: Within Functional Limits  [employer] Dominic A Sandoval 07/01/2024, 1:49 PM

## 2024-07-01 NOTE — Progress Notes (Signed)
 Occupational Therapy Discharge Summary  Patient Details  Name: Betty Golden MRN: [id number] Date of Birth: 1945-12-20  Date of Discharge from OT service:July 01, 2024   Patient has met 6 of 6 long term goals due to improved activity tolerance, improved balance, and ability to compensate for deficits.  Patient to discharge at overall Supervision to mod ind level.  Patient's care partner is independent to provide the necessary physical and cognitive assistance at discharge.    Reasons goals not met: n/a  Recommendation:  Patient will benefit from ongoing skilled OT services in home health setting to continue to advance functional skills in the area of BADL and iADL.  Equipment: BSC  Reasons for discharge: treatment goals met  Patient/family agrees with progress made and goals achieved: Yes  OT Discharge Precautions/Restrictions   Fall, expressive aphasia ADL ADL Eating: Independent Where Assessed-Eating: Chair Grooming: Modified independent Where Assessed-Grooming: Standing at sink Upper Body Bathing: Supervision/safety Where Assessed-Upper Body Bathing: Shower Lower Body Bathing: Supervision/safety Where Assessed-Lower Body Bathing: Shower Upper Body Dressing: Modified independent (Device) Where Assessed-Upper Body Dressing: Edge of bed Lower Body Dressing: Modified independent Where Assessed-Lower Body Dressing: Edge of bed Toileting: Modified independent Where Assessed-Toileting: Teacher, Adult Education: Engineer, Agricultural Method: Proofreader: Acupuncturist: Close supervision Film/video Editor Method: Designer, Industrial/product: [employer] [facility], Information systems manager with back Vision Baseline Vision/History: 1 Wears glasses Patient Visual Report: No change from baseline Perception  Perception: Within Functional Limits Praxis Praxis: WFL Cognition- WFL for basic self care tasks,  needs A with  complex memory Brief Interview for Mental Status (BIMS) Repetition of Three Words (First Attempt): 3 Temporal Orientation: Year: Correct Temporal Orientation: Month: Accurate within 5 days Temporal Orientation: Day: Correct Recall: Sock: Yes, no cue required Recall: Blue: Yes, no cue required Recall: Bed: Yes, no cue required BIMS Summary Score: 15 Sensation Sensation Light Touch: Appears Intact Hot/Cold: Appears Intact Proprioception: Appears Intact Stereognosis: Not tested Coordination Gross Motor Movements are Fluid and Coordinated: No Motor  Motor Motor: Other (comment) Motor - Discharge Observations: generalized weakness Mobility  Bed Mobility Bed Mobility: Supine to Sit;Sit to Supine Supine to Sit: Independent with assistive device Sit to Supine: Independent with assistive device Transfers Sit to Stand: Independent with assistive device Stand to Sit: Independent with assistive device  Trunk/Postural Assessment  Cervical Assessment Cervical Assessment: Within Functional Limits Thoracic Assessment Thoracic Assessment: Exceptions to [employer] (rounded shoulders) Lumbar Assessment Lumbar Assessment: Exceptions to [facility] (posterior pelvic tilt.) Postural Control Postural Control: Deficits on evaluation  Balance Static Sitting Balance Static Sitting - Balance Support: Feet supported Static Sitting - Level of Assistance: 7: Independent Dynamic Sitting Balance Dynamic Sitting - Balance Support: Feet supported Dynamic Sitting - Level of Assistance: 6: Modified independent (Device/Increase time) Static Standing Balance Static Standing - Balance Support: Bilateral upper extremity supported Static Standing - Level of Assistance: 7: Independent Dynamic Standing Balance Dynamic Standing - Balance Support: Left upper extremity supported Dynamic Standing - Level of Assistance: 6: Modified independent (Device/Increase time) Extremity/Trunk Assessment RUE Assessment RUE  Assessment: Within Functional Limits LUE Assessment LUE Assessment: Within Functional Limits   Betty Golden 07/01/2024, 10:29 AM

## 2024-07-01 NOTE — Progress Notes (Signed)
 Physical Therapy Session Note  Patient Details  Name: Betty Golden MRN: [id number] Date of Birth: 1946-01-14  Today's Date: 07/01/2024 PT Individual Time: 8696-8654 PT Individual Time Calculation (min): 42 min   Short Term Goals: Week 1:  PT Short Term Goal 1 (Week 1): STG+ LTG 2/2 ELOS  Skilled Therapeutic Interventions/Progress Updates: Patient sitting EOB with daughter present for family education on entrance to room. Patient alert and agreeable to PT session.   Patient reported no pain. Pt daughter with concerns about pt's medical status and d/c home. Pt daughter wanted to discuss this with attending physician. PA arrived to discuss concerns regarding said topic as well as pt's decreased appetite. PTA also reiterated that pt has improved functional mobility since evaluation. Pt donned/doffed hospital pants<personal pants independently sitting EOB, and then stood without AD to donn around waist. Pt performed all transfers during session with RW and overall modI and recall of hand placement. Pt transported from room<>main gym in [facility] dependently for time management. Pt navigated 12 (6) steps with lateral step to method and having B UE on R railing. Pt performed with supervision for safety and cued to ensure increased step width occurs while descending with R LE leading to have enough space for L foot placement. Pt daughter educated on this as well. Pt ambulated 150' in RW with supervision and VC to increase upright standing posture which also improved pt's tendency to maintain slight flexion in B knees. Pt and pt daughter with no further concerns or questions regarding pt's d/c home.   Patient sitting EOB at end of session with brakes locked, daughter present, and all needs within reach.      Therapy Documentation Precautions:  Precautions Precautions: Fall Precaution/Restrictions Comments: aphasia at baseline, watch HR Restrictions Weight Bearing Restrictions Per Provider Order:  No  Therapy/Group: Individual Therapy  Najeh Credit PTA 07/01/2024, 2:06 PM

## 2024-07-01 NOTE — Progress Notes (Signed)
 PROGRESS NOTE   Subjective/Complaints:  Stools slowing , type 6 not type 7  ROS: Patient denies CP, SOB, N/V/D, mild right ant knee pain    Objective:   DG Abd 1 View Result Date: 06/29/2024 CLINICAL DATA:  Nausea and vomiting. EXAM: DG ABDOMEN 1V COMPARISON:  Radiographs 06/22/2024.  CT 06/16/2024. FINDINGS: There is a normal nonobstructive bowel gas pattern. Mildly prominent stool in the left colon. No supine evidence of pneumoperitoneum or suspicious abdominal calcification. There are scattered vascular calcifications, multilevel spondylosis and asymmetric right hip degenerative changes. IMPRESSION: No evidence of bowel obstruction or other acute process. Mildly prominent stool in the left colon. Electronically Signed   By: Elsie Perone M.D.   On: 06/29/2024 15:59    No results for input(s): WBC, HGB, HCT, PLT in the last 72 hours.   Recent Labs    06/29/24 1050  NA 138  K 3.4*  CL 106  CO2 24  GLUCOSE 96  BUN 12  CREATININE 0.87  CALCIUM  9.1     Intake/Output Summary (Last 24 hours) at 07/01/2024 0759 Last data filed at 06/30/2024 1900 Gross per 24 hour  Intake 286 ml  Output --  Net 286 ml        Physical Exam: Vital Signs Blood pressure (!) 149/78, pulse 98, temperature 98.7 F (37.1 C), temperature source Oral, resp. rate 16, height 5' 4 (1.626 m), weight 57.9 kg, SpO2 98%.   General: No acute distress Mood and affect are appropriate Heart: Regular rate and rhythm no rubs murmurs or extra sounds Lungs: Clear to auscultation, breathing unlabored, no rales or wheezes Abdomen: Positive bowel sounds, mildly tender to palpation in all 4 quads , mildly distended  Extremities: No clubbing, cyanosis, or edema Skin: No evidence of breakdown, no evidence of rash  Neurological: very alert. No focal cn findings. aphasic with word substitutions although context of language/word finding is improving.   Comprehension appears fairly intact.  Sitting balance is good   Cranial nerves II through XII intact, motor strength is 4+/5 in bilateral deltoid, bicep, tricep, grip, hip flexor, knee extensors, ankle dorsiflexor and plantar flexor Neuro:  Eyes without evidence of nystagmus  Tone is normal without evidence of spasticity  No evidence of trunkal ataxia  Cerebellar exam normal finger to nose to finger Finger to thumb intact  Musculoskeletal: Full range of motion in all 4 extremities. No joint swelling or deformities  Assessment/Plan: 1. Functional deficits which require 3+ hours per day of interdisciplinary therapy in a comprehensive inpatient rehab setting. Physiatrist is providing close team supervision and 24 hour management of active medical problems listed below. Physiatrist and rehab team continue to assess barriers to discharge/monitor patient progress toward functional and medical goals  Care Tool:  Bathing    Body parts bathed by patient: Right arm, Left arm, Chest, Abdomen, Front perineal area, Buttocks, Face, Left lower leg, Right lower leg, Left upper leg, Right upper leg         Bathing assist Assist Level: Supervision/Verbal cueing     Upper Body Dressing/Undressing Upper body dressing   What is the patient wearing?: Pull over shirt    Upper body assist Assist Level:  Supervision/Verbal cueing    Lower Body Dressing/Undressing Lower body dressing      What is the patient wearing?: Underwear/pull up, Pants     Lower body assist Assist for lower body dressing: Contact Guard/Touching assist     Toileting Toileting    Toileting assist Assist for toileting: Supervision/Verbal cueing     Transfers Chair/bed transfer  Transfers assist     Chair/bed transfer assist level: Contact Guard/Touching assist     Locomotion Ambulation   Ambulation assist      Assist level: Contact Guard/Touching assist Assistive device: Walker-rolling Max distance: 48    Walk 10 feet activity   Assist     Assist level: Contact Guard/Touching assist Assistive device: Walker-rolling   Walk 50 feet activity   Assist Walk 50 feet with 2 turns activity did not occur: Safety/medical concerns         Walk 150 feet activity   Assist Walk 150 feet activity did not occur: Safety/medical concerns         Walk 10 feet on uneven surface  activity   Assist     Assist level: Minimal Assistance - Patient > 75% Assistive device: Walker-rolling   Wheelchair     Assist Is the patient using a wheelchair?: Yes Type of Wheelchair: Manual    Wheelchair assist level: Supervision/Verbal cueing Max wheelchair distance: 150    Wheelchair 50 feet with 2 turns activity    Assist        Assist Level: Supervision/Verbal cueing   Wheelchair 150 feet activity     Assist      Assist Level: Supervision/Verbal cueing   Blood pressure (!) 149/78, pulse 98, temperature 98.7 F (37.1 C), temperature source Oral, resp. rate 16, height 5' 4 (1.626 m), weight 57.9 kg, SpO2 98%.  Medical Problem List and Plan: 1. 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  Antithrombotics: -DVT/anticoagulation:  Pharmaceutical: Lovenox              -antiplatelet therapy: Aspirin  81 mg daily and Plavix  75 mg daily x 3 months then Plavix  alone 3. Pain Management/fibromyalgia: Cymbalta  30 mg twice daily, oxycodone  5 mg every 6 hours as needed  -11/28 pt was on lyrica  PTA, 25mg   bid--resumed for bilateral leg pain,FMS 4. Mood/Behavior/Sleep: Trazodone  25 mg nightly, Klonopin  0.25 mg daily as needed anxiety, melatonin 5 mg nightly as needed sleep             -antipsychotic agents: N/A   5. Neuropsych/cognition: This patient is not capable of making decisions on her own behalf.  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 Skin/Wound Care: Routine skin checks 7. Fluids/Electrolytes/Nutrition: Po intake remains poor  -appreciate RD consult and recs. Discussed importance with pt today  -encourage supplements, eat anything she likes Low K+ 3.0  Supplement KCL  20meq BID x 2 d recheck today  8.  Seizure prophylaxis.  Keppra  500 mg twice daily.  EEG negative 9.  COPD/asthma.  Quit smoking 35 years ago.  Continue inhalers as directed 10.  Hyperlipidemia.  Crestor  11.  Rheumatoid arthritis.Arava  20 mg daily 12.  GERD.  Protonix   13. Constipation -   - KUB moderate stool burden 11.25  -LBM 11/29 x 2 T1  -increase senokot-s to 2 tab bid- now with multiple BMs and nausea will reduce to 1 tab BID  KUB is showing less stool only in left colon     14.  Leukocytosis without fever    Latest Ref Rng & Units 06/28/2024    4:57 AM 06/23/2024    4:43 AM 06/22/2024    6:21 PM  CBC  WBC 4.0 - 10.5 K/uL 15.6  8.1  10.0   Hemoglobin 12.0 - 15.0 g/dL 89.0  89.5  88.4   Hematocrit 36.0 - 46.0 % 33.5  32.1  34.9   Platelets 150 - 400 K/uL 260  204  239      Has ranged from 8-18K over the last 6 month, will check UA  Seen by Hematology for same 05/15/2022 felt to be related to RA, no obvious flare up at present   15.  Abd discomfort likely due to freq stooling stop laxative monitor LOS: 9 days A FACE TO FACE EVALUATION WAS PERFORMED  Betty Golden 07/01/2024, 7:59 AM

## 2024-07-01 NOTE — Plan of Care (Signed)
  Problem: RH Swallowing Goal: LTG Patient will consume least restrictive diet using compensatory strategies with assistance (SLP) Description: LTG:  Patient will consume least restrictive diet using compensatory strategies with assistance (SLP) Outcome: Completed/Met   Problem: RH Comprehension Communication Goal: LTG Patient will comprehend basic/complex auditory (SLP) Description: LTG: Patient will comprehend basic/complex auditory information with cues (SLP). Outcome: Completed/Met   Problem: RH Expression Communication Goal: LTG Patient will increase word finding of common (SLP) Description: LTG:  Patient will increase word finding of common objects/daily info/abstract thoughts with cues using compensatory strategies (SLP). Outcome: Completed/Met   Problem: RH Expression Communication Goal: LTG Patient will verbally express basic/complex needs(SLP) Description: LTG:  Patient will verbally express basic/complex needs, wants or ideas with cues  (SLP) Outcome: Not Met (cont to require up to modA)

## 2024-07-01 NOTE — Progress Notes (Signed)
 Remote Loop Recorder Transmission

## 2024-07-02 MED ORDER — POTASSIUM CHLORIDE CRYS ER 20 MEQ PO TBCR
40.0000 meq | EXTENDED_RELEASE_TABLET | Freq: Once | ORAL | Status: AC
  Administered 2024-07-02: 40 meq via ORAL
  Filled 2024-07-02: qty 2

## 2024-07-02 NOTE — Progress Notes (Signed)
 PROGRESS NOTE   Subjective/Complaints:  Potassium minimally improved however no BM x >24h  ROS: Patient denies CP, SOB, N/V/D, mild right ant knee pain    Objective:   No results found.   No results for input(s): WBC, HGB, HCT, PLT in the last 72 hours.   Recent Labs    06/29/24 1050 07/01/24 0948  NA 138  --   K 3.4* 3.5  CL 106  --   CO2 24  --   GLUCOSE 96  --   BUN 12  --   CREATININE 0.87  --   CALCIUM  9.1  --      Intake/Output Summary (Last 24 hours) at 07/02/2024 9187 Last data filed at 07/01/2024 1256 Gross per 24 hour  Intake 340 ml  Output --  Net 340 ml        Physical Exam: Vital Signs Blood pressure (!) 144/69, pulse 94, temperature 98.2 F (36.8 C), temperature source Oral, resp. rate 17, height 5' 4 (1.626 m), weight 57.9 kg, SpO2 98%.   General: No acute distress Mood and affect are appropriate Heart: Regular rate and rhythm no rubs murmurs or extra sounds Lungs: Clear to auscultation, breathing unlabored, no rales or wheezes Abdomen: Positive bowel sounds, mildly tender to palpation in all 4 quads , mildly distended  Extremities: No clubbing, cyanosis, or edema Skin: No evidence of breakdown, no evidence of rash  Neurological: very alert. No focal cn findings. aphasic with word substitutions although context of language/word finding is improving.  Comprehension appears fairly intact.  Sitting balance is good   Cranial nerves II through XII intact, motor strength is 4+/5 in bilateral deltoid, bicep, tricep, grip, hip flexor, knee extensors, ankle dorsiflexor and plantar flexor Neuro:  Eyes without evidence of nystagmus  Tone is normal without evidence of spasticity  Musculoskeletal: Full range of motion in all 4 extremities. No joint swelling or deformities  Assessment/Plan: 1. Functional deficits which require 3+ hours per day of interdisciplinary therapy in a  comprehensive inpatient rehab setting. Physiatrist is providing close team supervision and 24 hour management of active medical problems listed below. Physiatrist and rehab team continue to assess barriers to discharge/monitor patient progress toward functional and medical goals  Care Tool:  Bathing    Body parts bathed by patient: Right arm, Left arm, Chest, Abdomen, Front perineal area, Buttocks, Face, Left lower leg, Right lower leg, Left upper leg, Right upper leg         Bathing assist Assist Level: Supervision/Verbal cueing     Upper Body Dressing/Undressing Upper body dressing   What is the patient wearing?: Pull over shirt    Upper body assist Assist Level: Independent with assistive device    Lower Body Dressing/Undressing Lower body dressing      What is the patient wearing?: Underwear/pull up, Pants     Lower body assist Assist for lower body dressing: Independent with assitive device     Toileting Toileting    Toileting assist Assist for toileting: Independent with assistive device     Transfers Chair/bed transfer  Transfers assist     Chair/bed transfer assist level: Independent with assistive device  Locomotion Ambulation   Ambulation assist      Assist level: Supervision/Verbal cueing Assistive device: Walker-rolling Max distance: 150   Walk 10 feet activity   Assist     Assist level: Independent with assistive device Assistive device: Walker-rolling   Walk 50 feet activity   Assist Walk 50 feet with 2 turns activity did not occur: Safety/medical concerns  Assist level: Supervision/Verbal cueing Assistive device: Walker-rolling    Walk 150 feet activity   Assist Walk 150 feet activity did not occur: Safety/medical concerns  Assist level: Supervision/Verbal cueing Assistive device: Walker-rolling    Walk 10 feet on uneven surface  activity   Assist     Assist level: Supervision/Verbal cueing Assistive  device: Walker-rolling   Wheelchair     Assist Is the patient using a wheelchair?: Yes Type of Wheelchair: Manual    Wheelchair assist level: Supervision/Verbal cueing Max wheelchair distance: 150    Wheelchair 50 feet with 2 turns activity    Assist        Assist Level: Supervision/Verbal cueing   Wheelchair 150 feet activity     Assist      Assist Level: Supervision/Verbal cueing   Blood pressure (!) 144/69, pulse 94, temperature 98.2 F (36.8 C), temperature source Oral, resp. rate 17, height 5' 4 (1.626 m), weight 57.9 kg, SpO2 98%.  Medical Problem List and Plan: 1. 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  Antithrombotics: -DVT/anticoagulation:  Pharmaceutical: Lovenox              -antiplatelet therapy: Aspirin  81 mg daily and Plavix  75 mg daily x 3 months then Plavix  alone 3. Pain Management/fibromyalgia: Cymbalta  30 mg twice daily, oxycodone  5 mg every 6 hours as needed  -11/28 pt was on lyrica  PTA, 25mg  bid--resumed for bilateral leg pain,FMS 4. Mood/Behavior/Sleep: Trazodone  25 mg nightly, Klonopin  0.25 mg daily as needed anxiety, melatonin 5 mg nightly as needed sleep             -antipsychotic agents: N/A   5. Neuropsych/cognition: This patient is not capable of making decisions on her own behalf.  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 Skin/Wound Care: Routine skin checks 7. Fluids/Electrolytes/Nutrition: Po intake remains poor  -appreciate RD consult and recs. Discussed importance with pt today  -encourage supplements, eat anything she likes Low K+ 3.0   Supplement KCL 20meq BID x 2 d recheck improved to 3.5 , likely GI loss due to freq loose stools which have now resolved   8.  Seizure prophylaxis.  Keppra  500 mg twice daily.  EEG negative 9.  COPD/asthma.  Quit smoking 35 years ago.  Continue inhalers as directed 10.  Hyperlipidemia.  Crestor  11.  Rheumatoid arthritis.Arava  20 mg daily 12.  GERD.  Protonix   13. Constipation -  Resolved - KUB moderate stool burden 11.25  -many stools over the last several days  -increase senokot-s to 2 tab bid- now with multiple BMs and nausea will reduce to 1 tab at bedtime prn      14.  Leukocytosis without fever    Latest Ref Rng & Units 06/28/2024  4:57 AM 06/23/2024    4:43 AM 06/22/2024    6:21 PM  CBC  WBC 4.0 - 10.5 K/uL 15.6  8.1  10.0   Hemoglobin 12.0 - 15.0 g/dL 89.0  89.5  88.4   Hematocrit 36.0 - 46.0 % 33.5  32.1  34.9   Platelets 150 - 400 K/uL 260  204  239      Has ranged from 8-18K over the last 6 month, will check UA  Seen by Hematology for same 05/15/2022 felt to be related to RA, no obvious flare up at present   15.  Abd discomfort likely due to freq stooling stop laxative ResolvedLOS: 10 days A FACE TO FACE EVALUATION WAS PERFORMED  Prentice FORBES Compton 07/02/2024, 8:12 AM

## 2024-07-02 NOTE — Progress Notes (Signed)
 Inpatient Rehabilitation Care Coordinator Discharge Note   Patient Details  Name: Betty Golden MRN: [id number] Date of Birth: [birth year]   Discharge location: HOME WITH DAUGHTER AND HER FAMILY-DAUGHTER AWARE OF THE 24/7 CGA LEVEL DUE TO FREQUENT FALLS PRIOR TO ADMISSION  Length of Stay: 10 DAYS  Discharge activity level: CGA-SUPERVISION LEVEL  Home/community participation: HOMEBOUND  Patient response un:Yzjouy Literacy - How often do you need to have someone help you when you read instructions, pamphlets, or other written material from your doctor or pharmacy?: Sometimes  Patient response un:Dnrpjo Isolation - How often do you feel lonely or isolated from those around you?: Rarely  Services provided included: MD, RD, PT, OT, SLP, RN, CM, TR, Pharmacy, Neuropsych, SW  Financial Services:  Field Seismologist Utilized: [employer] MEDICARE  Choices offered to/list presented to: PT AND DAUGHTER  Follow-up services arranged:  Home Health, Patient/Family request agency HH/DME Home Health Agency: [facility]  PT  OT  SP   Referral made to adapt for bedside commode and delivered to room   HH/DME Requested Agency: [facility]  Patient response to transportation need: Is the patient able to respond to transportation needs?: Yes In the past 12 months, has lack of transportation kept you from medical appointments or from getting medications?: No In the past 12 months, has lack of transportation kept you from meetings, work, or from getting things needed for daily living?: No   Patient/Family verbalized understanding of follow-up arrangements:  Yes  Individual responsible for coordination of the follow-up plan: NICOLE-DAUGHTER [phone]  Confirmed correct DME delivered: Raymonde Asberry MATSU 07/02/2024    Comments (or additional information):DAUGHTER CAME IN FOR EDUCATION YESTERDAY AND IS AWARE OF MOM'S NEED FOR CGA WHEN UP MOVING DUE TO HIGH RISK TO FALL.  PT FEELS BETTER ABOUT DISCHARGING WITH DAUGHTER AND HER FAMILY.   Summary of Stay    Date/Time Discharge Planning CSW  06/30/24 [phone] Pt having issues with daughter but still plans to go home with her. Made daughter aware she will need SBA due to balance issues and BERG being so low. Doing well until yesterday and then missed some therapies due to illness RGD  06/23/24 0839 HOme with daughter and daughter's family-daughter is retired and can provide supervision level. Has DME from previous admissions RGD       Christinea Brizuela G

## 2024-07-02 NOTE — Plan of Care (Signed)
  Problem: RH Balance Goal: LTG Patient will maintain dynamic standing with ADLs (OT) Description: LTG:  Patient will maintain dynamic standing balance with assist during activities of daily living (OT)  Outcome: Completed/Met   Problem: Sit to Stand Goal: LTG:  Patient will perform sit to stand in prep for activites of daily living with assistance level (OT) Description: LTG:  Patient will perform sit to stand in prep for activites of daily living with assistance level (OT) Outcome: Completed/Met   Problem: RH Dressing Goal: LTG Patient will perform lower body dressing w/assist (OT) Description: LTG: Patient will perform lower body dressing with assist, with/without cues in positioning using equipment (OT) Outcome: Completed/Met   Problem: RH Toileting Goal: LTG Patient will perform toileting task (3/3 steps) with assistance level (OT) Description: LTG: Patient will perform toileting task (3/3 steps) with assistance level (OT)  Outcome: Completed/Met   Problem: RH Toilet Transfers Goal: LTG Patient will perform toilet transfers w/assist (OT) Description: LTG: Patient will perform toilet transfers with assist, with/without cues using equipment (OT) Outcome: Completed/Met   Problem: RH Tub/Shower Transfers Goal: LTG Patient will perform tub/shower transfers w/assist (OT) Description: LTG: Patient will perform tub/shower transfers with assist, with/without cues using equipment (OT) Outcome: Completed/Met

## 2024-07-02 NOTE — Progress Notes (Signed)
 Discharge instructions provided by Rolan A.-PA. Medications received from [facility], Staff assisted patient off unit; patient discharged safely via private care with daughter.   Geni Armor, LPN

## 2024-07-06 ENCOUNTER — Other Ambulatory Visit: Payer: Self-pay

## 2024-07-06 ENCOUNTER — Emergency Department (HOSPITAL_COMMUNITY)
Admission: EM | Admit: 2024-07-06 | Discharge: 2024-07-07 | Disposition: A | Attending: Emergency Medicine | Admitting: Emergency Medicine

## 2024-07-06 DIAGNOSIS — Z638 Other specified problems related to primary support group: Secondary | ICD-10-CM | POA: Diagnosis not present

## 2024-07-06 DIAGNOSIS — Z8673 Personal history of transient ischemic attack (TIA), and cerebral infarction without residual deficits: Secondary | ICD-10-CM | POA: Diagnosis not present

## 2024-07-06 DIAGNOSIS — F03A0 Unspecified dementia, mild, without behavioral disturbance, psychotic disturbance, mood disturbance, and anxiety: Secondary | ICD-10-CM | POA: Diagnosis not present

## 2024-07-06 LAB — CBC WITH DIFFERENTIAL/PLATELET
Abs Immature Granulocytes: 0.04 K/uL (ref 0.00–0.07)
Basophils Absolute: 0.1 K/uL (ref 0.0–0.1)
Basophils Relative: 1 %
Eosinophils Absolute: 0.1 K/uL (ref 0.0–0.5)
Eosinophils Relative: 1 %
HCT: 36.6 % (ref 36.0–46.0)
Hemoglobin: 11.6 g/dL — ABNORMAL LOW (ref 12.0–15.0)
Immature Granulocytes: 0 %
Lymphocytes Relative: 32 %
Lymphs Abs: 4.3 K/uL — ABNORMAL HIGH (ref 0.7–4.0)
MCH: 27.6 pg (ref 26.0–34.0)
MCHC: 31.7 g/dL (ref 30.0–36.0)
MCV: 86.9 fL (ref 80.0–100.0)
Monocytes Absolute: 0.7 K/uL (ref 0.1–1.0)
Monocytes Relative: 6 %
Neutro Abs: 7.9 K/uL — ABNORMAL HIGH (ref 1.7–7.7)
Neutrophils Relative %: 60 %
Platelets: 275 K/uL (ref 150–400)
RBC: 4.21 MIL/uL (ref 3.87–5.11)
RDW: 14.4 % (ref 11.5–15.5)
WBC: 13.1 K/uL — ABNORMAL HIGH (ref 4.0–10.5)
nRBC: 0 % (ref 0.0–0.2)

## 2024-07-06 LAB — URINALYSIS, W/ REFLEX TO CULTURE (INFECTION SUSPECTED)
Bacteria, UA: NONE SEEN
Bilirubin Urine: NEGATIVE
Glucose, UA: NEGATIVE mg/dL
Hgb urine dipstick: NEGATIVE
Ketones, ur: NEGATIVE mg/dL
Nitrite: NEGATIVE
Protein, ur: 30 mg/dL — AB
Specific Gravity, Urine: 1.021 (ref 1.005–1.030)
pH: 5 (ref 5.0–8.0)

## 2024-07-06 LAB — URINE DRUG SCREEN
Amphetamines: NEGATIVE
Barbiturates: NEGATIVE
Benzodiazepines: NEGATIVE
Cocaine: NEGATIVE
Fentanyl: NEGATIVE
Methadone Scn, Ur: NEGATIVE
Opiates: NEGATIVE
Tetrahydrocannabinol: NEGATIVE

## 2024-07-06 LAB — COMPREHENSIVE METABOLIC PANEL WITH GFR
ALT: 12 U/L (ref 0–44)
AST: 20 U/L (ref 15–41)
Albumin: 4 g/dL (ref 3.5–5.0)
Alkaline Phosphatase: 91 U/L (ref 38–126)
Anion gap: 12 (ref 5–15)
BUN: 18 mg/dL (ref 8–23)
CO2: 25 mmol/L (ref 22–32)
Calcium: 10.5 mg/dL — ABNORMAL HIGH (ref 8.9–10.3)
Chloride: 106 mmol/L (ref 98–111)
Creatinine, Ser: 0.83 mg/dL (ref 0.44–1.00)
GFR, Estimated: >60 mL/min (ref >60)
Glucose, Bld: 90 mg/dL (ref 70–99)
Potassium: 4 mmol/L (ref 3.5–5.1)
Sodium: 143 mmol/L (ref 135–145)
Total Bilirubin: 0.3 mg/dL (ref 0.0–1.2)
Total Protein: 7.4 g/dL (ref 6.5–8.1)

## 2024-07-06 LAB — ETHANOL: Alcohol, Ethyl (B): <15 mg/dL (ref <15)

## 2024-07-06 MED ORDER — COLESTIPOL HCL 1 G PO TABS
1.0000 g | ORAL_TABLET | Freq: Every day | ORAL | Status: DC
  Administered 2024-07-07: 1 g via ORAL
  Filled 2024-07-06 (×2): qty 1

## 2024-07-06 MED ORDER — FERROUS SULFATE 325 (65 FE) MG PO TABS
325.0000 mg | ORAL_TABLET | Freq: Every day | ORAL | Status: DC
  Administered 2024-07-07: 325 mg via ORAL
  Filled 2024-07-06 (×2): qty 1

## 2024-07-06 MED ORDER — LEFLUNOMIDE 10 MG PO TABS
20.0000 mg | ORAL_TABLET | Freq: Every day | ORAL | Status: DC
  Filled 2024-07-06: qty 2

## 2024-07-06 MED ORDER — PANTOPRAZOLE SODIUM 40 MG PO TBEC
40.0000 mg | DELAYED_RELEASE_TABLET | Freq: Every day | ORAL | Status: DC
  Administered 2024-07-07: 40 mg via ORAL
  Filled 2024-07-06 (×2): qty 1

## 2024-07-06 MED ORDER — TRAZODONE HCL 50 MG PO TABS
25.0000 mg | ORAL_TABLET | Freq: Every day | ORAL | Status: DC
  Filled 2024-07-06: qty 1

## 2024-07-06 MED ORDER — ASPIRIN 81 MG PO TBEC
81.0000 mg | DELAYED_RELEASE_TABLET | Freq: Every day | ORAL | Status: DC
  Administered 2024-07-07: 81 mg via ORAL
  Filled 2024-07-06 (×2): qty 1

## 2024-07-06 MED ORDER — ALBUTEROL SULFATE HFA 108 (90 BASE) MCG/ACT IN AERS: 2.0000 | INHALATION_SPRAY | RESPIRATORY_TRACT | Status: DC | PRN

## 2024-07-06 MED ORDER — CLOPIDOGREL BISULFATE 75 MG PO TABS
75.0000 mg | ORAL_TABLET | Freq: Every day | ORAL | Status: DC
  Administered 2024-07-07: 75 mg via ORAL
  Filled 2024-07-06 (×2): qty 1

## 2024-07-06 MED ORDER — PREGABALIN 25 MG PO CAPS
25.0000 mg | ORAL_CAPSULE | Freq: Two times a day (BID) | ORAL | Status: DC
  Administered 2024-07-06 – 2024-07-07 (×2): 25 mg via ORAL
  Filled 2024-07-06 (×2): qty 1

## 2024-07-06 MED ORDER — CLONAZEPAM 0.125 MG PO TBDP: 0.2500 mg | ORAL_TABLET | Freq: Two times a day (BID) | ORAL | Status: DC | PRN

## 2024-07-06 MED ORDER — LEVETIRACETAM 500 MG PO TABS
500.0000 mg | ORAL_TABLET | Freq: Two times a day (BID) | ORAL | Status: DC
  Administered 2024-07-06 – 2024-07-07 (×2): 500 mg via ORAL
  Filled 2024-07-06 (×2): qty 1

## 2024-07-06 MED ORDER — ADULT MULTIVITAMIN W/MINERALS CH
1.0000 | ORAL_TABLET | Freq: Every day | ORAL | Status: DC
  Administered 2024-07-06 – 2024-07-07 (×2): 1 via ORAL
  Filled 2024-07-06 (×2): qty 1

## 2024-07-06 MED ORDER — ROSUVASTATIN CALCIUM 20 MG PO TABS
20.0000 mg | ORAL_TABLET | Freq: Every day | ORAL | Status: DC
  Administered 2024-07-07: 20 mg via ORAL
  Filled 2024-07-06 (×2): qty 1

## 2024-07-06 MED ORDER — METOPROLOL SUCCINATE ER 50 MG PO TB24
25.0000 mg | ORAL_TABLET | Freq: Every day | ORAL | Status: DC
  Administered 2024-07-06 – 2024-07-07 (×2): 25 mg via ORAL
  Filled 2024-07-06 (×2): qty 1

## 2024-07-06 MED ORDER — OXYCODONE HCL 5 MG PO TABS: 5.0000 mg | ORAL_TABLET | Freq: Four times a day (QID) | ORAL | Status: DC | PRN

## 2024-07-06 MED ORDER — DULOXETINE HCL 30 MG PO CPEP
30.0000 mg | ORAL_CAPSULE | Freq: Two times a day (BID) | ORAL | Status: DC
  Administered 2024-07-06 – 2024-07-07 (×2): 30 mg via ORAL
  Filled 2024-07-06 (×2): qty 1

## 2024-07-06 NOTE — BH Assessment (Signed)
 Clinician spoke with IRIS to complete pt's TTS assessment. Clinician provided pt's name, MRN, location, age, room number and provider's name. Secure message completed.    Iris coordinator to update secure chat when assessment time and provider are assigned.  Jackson JONETTA Broach, MS, [facility], [facility] Triage Specialist [phone]

## 2024-07-06 NOTE — ED Triage Notes (Signed)
 Patient presents via police IVC'd by family. She has been yelling and acting out. Family says she has been screaming and walked away from home. They believe she is a danger to herself and other.    HX: Depression and anxiety

## 2024-07-06 NOTE — ED Provider Notes (Addendum)
 [place] [facility]ERGENCY DEPARTMENT AT [facility] Provider Note   CSN: [id number] Arrival date & time: 07/06/24  8161     Patient presents with: Psychiatric Evaluation   Betty Golden is a 78 y.o. female.   78 year old female who presents placement of IVC by family.  She is much more pleasant.  Patient does present for dementia but only very mild.  She cannot tolerate with her family and want to walk away.  She denies SI or HI.  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  She denies any headache or new neurological features at this time.  States he is not drinking alcohol.  Per law enforcement, patient has been cooperative       Prior to Admission medications   Medication Sig Start Date End Date Taking? Authorizing Provider  acetaminophen  (TYLENOL ) 325 MG tablet Take 1-2 tablets (325-650 mg total) by mouth every 4 (four) hours as needed for mild pain (pain score 1-3). Patient taking differently: Take 650 mg by mouth daily as needed for mild pain (pain score 1-3) or moderate pain (pain score 4-6). 04/19/24   Love, Sharlet RAMAN, PA-C  albuterol  (VENTOLIN  HFA) 108 (90 Base) MCG/ACT inhaler INHALE 2 PUFFS INTO THE LUNGS EVERY 4 HOURS AS NEEDED FOR WHEEZING OR SHORTNESS OF BREATH 06/03/24   Iva Marty Saltness, MD  aspirin  EC 81 MG tablet Take 1 tablet (81 mg total) by mouth daily. Swallow whole. 05/27/24   Dibia, Pauline E, MD  benralizumab  (FASENRA ) 30 MG/ML prefilled syringe Inject 1 mL (30 mg total) into the skin every 8 (eight) weeks. Patient not taking: Reported on 06/17/2024 07/31/23   Iva Marty Saltness, MD  Certolizumab Pegol (CIMZIA [place]) Inject 1 Dose into the skin every 14 (fourteen) days. Patient not taking: Reported on 06/14/2024    [provider]  clindamycin (CLEOCIN T) 1 % lotion Apply 1 Application topically 2 (two) times daily. Patient not taking: Reported on 06/17/2024    [provider]   clonazePAM  (KLONOPIN ) 0.25 MG disintegrating tablet Take 1 tablet (0.25 mg total) by mouth 2 (two) times daily as needed (Anxiety). 07/01/24   Angiulli, Toribio JINNY, PA-C  clopidogrel  (PLAVIX ) 75 MG tablet Take 1 tablet (75 mg total) by mouth daily. 07/01/24 09/29/24  Angiulli, Toribio JINNY, PA-C  colestipol  (COLESTID ) 1 g tablet Take 1 g by mouth daily.    [provider]  DULoxetine  (CYMBALTA ) 30 MG capsule Take 1 capsule (30 mg total) by mouth 2 (two) times daily. 07/01/24   Angiulli, Toribio JINNY, PA-C  ferrous sulfate  325 (65 FE) MG tablet Take 1 tablet (325 mg total) by mouth daily. 07/01/24   Angiulli, Toribio JINNY, PA-C  Fluticasone -Umeclidin-Vilant (TRELEGY ELLIPTA ) 200-62.5-25 MCG/ACT AEPB Inhale 1 puff into the lungs in the morning. 10/15/23   Iva Marty Saltness, MD  leflunomide  (ARAVA ) 20 MG tablet Take 20 mg by mouth daily.    [provider]  levETIRAcetam  (KEPPRA ) 500 MG tablet Take 1 tablet (500 mg total) by mouth 2 (two) times daily. 07/01/24   Angiulli, Toribio JINNY, PA-C  meclizine  (ANTIVERT ) 12.5 MG tablet Take 1 tablet (12.5 mg total) by mouth daily. 07/01/24   Angiulli, Toribio JINNY, PA-C  metoprolol  succinate (TOPROL -XL) 25 MG 24 hr tablet Take 1 tablet (25 mg total) by mouth daily. 07/01/24   Angiulli, Daniel J, PA-C  Multiple Vitamin (MULTIVITAMIN WITH MINERALS) TABS tablet Take 1 tablet by mouth daily. 07/01/24   Angiulli, Toribio JINNY,  PA-C  oxyCODONE  (OXY IR/ROXICODONE ) 5 MG immediate release tablet Take 1 tablet (5 mg total) by mouth every 6 (six) hours as needed for severe pain (pain score 7-10). 07/01/24   Angiulli, Toribio PARAS, PA-C  pantoprazole  (PROTONIX ) 40 MG tablet Take 1 tablet (40 mg total) by mouth daily. 07/01/24   Angiulli, Toribio PARAS, PA-C  pregabalin  (LYRICA ) 25 MG capsule Take 1 capsule (25 mg total) by mouth 2 (two) times daily. 07/01/24   Angiulli, Toribio PARAS, PA-C  rosuvastatin  (CRESTOR ) 20 MG tablet Take 1 tablet (20 mg total) by mouth daily. 07/01/24   Angiulli, Toribio PARAS, PA-C   traZODone  (DESYREL ) 50 MG tablet Take 0.5 tablets (25 mg total) by mouth at bedtime. 07/01/24   Angiulli, Toribio PARAS, PA-C    Allergies: Hydromorphone  and Sulfa antibiotics    Review of Systems  All other systems reviewed and are negative.   Updated Vital Signs BP (!) 160/84 (BP Location: Left Arm)   Pulse (!) 104   Temp 98.6 F (37 C) (Oral)   Resp 16   SpO2 98%   Physical Exam Vitals and nursing note reviewed.  Constitutional:      General: She is not in acute distress.    Appearance: Normal appearance. She is well-developed. She is not toxic-appearing.  HENT:     Head: Normocephalic and atraumatic.  Eyes:     General: Lids are normal.     Conjunctiva/sclera: Conjunctivae normal.     Pupils: Pupils are equal, round, and reactive to light.  Neck:     Thyroid: No thyroid mass.     Trachea: No tracheal deviation.  Cardiovascular:     Rate and Rhythm: Normal rate and regular rhythm.     Heart sounds: Normal heart sounds. No murmur heard.    No gallop.  Pulmonary:     Effort: Pulmonary effort is normal. No respiratory distress.     Breath sounds: Normal breath sounds. No stridor. No decreased breath sounds, wheezing, rhonchi or rales.  Abdominal:     General: There is no distension.     Palpations: Abdomen is soft.     Tenderness: There is no abdominal tenderness. There is no rebound.  Musculoskeletal:        General: No tenderness. Normal range of motion.     Cervical back: Normal range of motion and neck supple.  Skin:    General: Skin is warm and dry.     Findings: No abrasion or rash.  Neurological:     Mental Status: She is alert and oriented to person, place, and time. Mental status is at baseline.     GCS: GCS eye subscore is 4. GCS verbal subscore is 5. GCS motor subscore is 6.     Cranial Nerves: No cranial nerve deficit.     Sensory: No sensory deficit.     Motor: Motor function is intact.  Psychiatric:        Attention and Perception: Attention normal.         Speech: Speech normal.        Behavior: Behavior normal.        Thought Content: Thought content does not include homicidal or suicidal ideation.     (all labs ordered are listed, but only abnormal results are displayed) Labs Reviewed  CBC WITH DIFFERENTIAL/PLATELET  COMPREHENSIVE METABOLIC PANEL WITH GFR  ETHANOL  URINE DRUG SCREEN  URINALYSIS, W/ REFLEX TO CULTURE (INFECTION SUSPECTED)    EKG: None  Radiology: No results found.  Procedures   Medications Ordered in the ED - No data to display                                  Medical Decision Making Amount and/or Complexity of Data Reviewed Labs: ordered. ECG/medicine tests: ordered.  Risk OTC drugs. Prescription drug management.   Emergency management filled out.  Patient has no acute medical conditions.  Labs evaluated no acute findings noted.  Patient is medically clear for psychiatric disposition     Final diagnoses:  None    ED Discharge Orders     None          Dasie Faden, MD 07/06/24 1949    Dasie Faden, MD 07/06/24 2234

## 2024-07-07 ENCOUNTER — Ambulatory Visit

## 2024-07-07 ENCOUNTER — Other Ambulatory Visit: Payer: Self-pay | Admitting: *Deleted

## 2024-07-07 DIAGNOSIS — Z638 Other specified problems related to primary support group: Secondary | ICD-10-CM

## 2024-07-07 DIAGNOSIS — Z8673 Personal history of transient ischemic attack (TIA), and cerebral infarction without residual deficits: Secondary | ICD-10-CM | POA: Diagnosis not present

## 2024-07-07 DIAGNOSIS — F4320 Adjustment disorder, unspecified: Secondary | ICD-10-CM | POA: Diagnosis not present

## 2024-07-07 DIAGNOSIS — F03A0 Unspecified dementia, mild, without behavioral disturbance, psychotic disturbance, mood disturbance, and anxiety: Secondary | ICD-10-CM

## 2024-07-07 MED ORDER — FASENRA 30 MG/ML ~~LOC~~ SOSY: 30.0000 mg | PREFILLED_SYRINGE | SUBCUTANEOUS | 6 refills | Status: AC

## 2024-07-07 NOTE — ED Provider Notes (Signed)
 Emergency Medicine Observation Re-evaluation Note  Betty Golden is a 78 y.o. female, seen on rounds today.  Pt initially presented to the ED for complaints of Psychiatric Evaluation Currently, the patient is resting.  Physical Exam  BP (!) 147/79 (BP Location: Left Arm)   Pulse 93   Temp 98.4 F (36.9 C) (Oral)   Resp 19   SpO2 97%  Physical Exam General: NAD Cardiac: well perfused Lungs: even and unlabored Psych: no agitation, pleasant and cooperative  ED Course / MDM  EKG:EKG Interpretation Date/Time:  Tuesday July 06 2024 19:48:44 EST Ventricular Rate:  97 PR Interval:  184 QRS Duration:  90 QT Interval:  336 QTC Calculation: 426 R Axis:   34  Text Interpretation: Normal sinus rhythm Possible Lateral infarct , age undetermined Possible Inferior infarct , age undetermined Abnormal ECG When compared with ECG of 25-Jun-2024 16:24, PREVIOUS ECG IS PRESENT Confirmed by Dasie Faden ([id number]) on 07/06/2024 10:33:32 PM  I have reviewed the labs performed to date as well as medications administered while in observation.  Recent changes in the last 24 hours include medically cleared on initial eval, psychiatrically determined no inpatient criteria met. Labs reviewed from yesterday, UA not convincing for UTI.  Plan  Current plan is for discharge.  Engaged family for discharge, spoke with the patient's daughter regarding the workup to date, she endorsed concern for worsening dementia and the patient wandering out of the house. Will engage social work to coordinate placement. Family to pick up patient at 1pm, [facility] assisted with patient for outpatient long-term care placement.    Jerrol Agent, MD 07/07/24 [phone]

## 2024-07-07 NOTE — Consult Note (Signed)
 [facility] Consult Note  Patient Name: Betty Golden MRN: [id number] DOB: 12-30-1945 DATE OF Consult: 07/07/2024  PRIMARY PSYCHIATRIC DIAGNOSES  1.  Adjustment disorder 2.  Family conflict and housing instability 3.  Mild dementia 4. History of CVA  RECOMMENDATIONS  Formulation: A 78 year old woman with a history of mild dementia, multiple strokes, cardiac stent placement, frequent falls, and mobility limitations presented following an acute family conflict that resulted in her leaving the home and her daughter initiating an IVC which reported that she exhibited agitation, yelling, and acting out. Patient was appropriate throughout evaluation. She denied suicidal and  homicidal ideation, psychosis, or substance use. The primary stressors appear psychosocial, including family conflict, housing instability, and limited support.  Labs reveal an elevated WBC (13.1) and neutrophilia (7.9), with urinalysis showing hazy urine, small leukocytes, and elevated protein, consistent with a possible urinary tract infection. Given her age, dementia, and alleged behavioral changes reported on IVC, a UTI is positive. Called was placed to her daughter but was unable to reach her via phone.  Patient does not meet criteria for inpatient psych admission.  Medication recommendations:  Continue home medications as prescribed  Non-Medication/therapeutic recommendations:  Initiate case management and social work consult for possible SNF placement.  Communication: Treatment team members (and family members if applicable) who were involved in treatment/care discussions and planning, and with whom we spoke or engaged with via secure text/chat, include the following: patient's treatment team.  Thank you for involving us  in the care of this patient. If you have any additional questions or concerns, please call [phone] and ask for me or the provider on-call.  TELEPSYCHIATRY ATTESTATION & CONSENT  As  the provider for this telehealth consult, I attest that I verified the patients identity using two separate identifiers, introduced myself to the patient, provided my credentials, disclosed my location, and performed this encounter via a HIPAA-compliant, real-time, face-to-face, two-way, interactive audio and video platform and with the full consent and agreement of the patient (or guardian as applicable.)  Patient physical location: [facility]. Telehealth provider physical location: home office in state of [place].  Video start time: 2345 (Central Time) Video end time: 0006 (Central Time)  IDENTIFYING DATA  Betty Golden is a 78 y.o. year-old female for whom a psychiatric consultation has been ordered by the primary provider. The patient was identified using two separate identifiers.  CHIEF COMPLAINT/REASON FOR CONSULT  - Pt. presented following a family argument resulting in her leaving the house and police involvement; she expressed distress over her living situation and desire for alternative housing.  HISTORY OF PRESENT ILLNESS (HPI)  A 78 year old woman was evaluated following an episode of acute family conflict at home, which resulted in her leaving the residence and subsequent involvement of law enforcement. An IVC was initiated by her daughter. Per IVC, The patient has been diagnosed with depression, anxiety, undiagnosed mental health issues and appears to be suffering from mental health issues. Respondent was yelling, acting out and screaming at everyone in the house then left the home walking and is high falls risk. She was returned by police. Family believes she is a danger to herself and others.  The patient reports that the incident began with escalating arguments between her, her daughter and daughter's husband during which she described feeling unwelcome and targeted by family members. She reported that her daughter and her husband were verbally aggressive, and she perceived  that her daughter's husband was attempting to have her removed from the household.  In response to the distress, she left the house with her walker, wandered the neighborhood in cold weather, and was approached by police who encouraged her to return home. Upon her return, further arguments ensued, and her daughter had already contacted the police.  She denied any physical aggression towards others, stating that her mobility limitations due to use of a walker preclude such actions. She also denied any intent or history of self-harm or harm to others. No suicidal or homicidal ideation was endorsed. She reported multiple recent hospitalizations, including for stroke and cardiac issues, with a history of three strokes and a cardiac stent placement. Frequent falls and resultant injuries were noted, contributing to her medical complexity. She described ongoing rehabilitation for her legs and ambulation difficulties, and stated that insurance limitations currently prevent her from transitioning directly from rehab to a nursing home or senior living facility.  She expressed significant distress regarding her living situation, stating she does not wish to remain with her family and has no alternative housing options. She indicated a desire to seek shelter or placement in a senior facility, but financial and insurance barriers have impeded this. She denied current or past psychiatric care, substance use, or psychotic symptoms such as hallucinations or paranoia. Medication adherence was affirmed, and she reported ongoing engagement with hospital staff and social workers in efforts to secure appropriate placement and support. No evidence of mood disorder, psychosis, or substance abuse was elicited during the interview. Chart review does show a history of mild dementia but patient was able to answer questions appropriately apart from having aphagia due to her strokes. The primary concerns remain psychosocial stressors related  to family conflict, housing instability, and medical comorbidities.  Lab results show elevated WBC count of 13.1 and increased absolute neutrophils (7.9), which support the suspicion of an infection. Additionally, the urinalysis showed hazy appearance, small leukocytes, and elevated protein, which could indicate a urinary tract infection.  PAST PSYCHIATRIC HISTORY  - No history suicide attempts, or self-harm behaviors were reported. - Chart review show a history of depression. No history of inpatient psych admission reported. Otherwise as per HPI above.  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 difficile colitis    Cerebrovascular accident (CVA) due to embolism of precerebral artery (HCC) 05/25/2024   Cervical stenosis of spine    Chronic headaches    Chronic neck pain    Colitis due to Clostridium difficile 02/10/2015   Coronary artery disease    Depression    Diverticulosis    Essential hypertension    Fibromyalgia    GERD (gastroesophageal reflux disease)    History of CVA (cerebrovascular accident) 05/06/2024   History of Salmonella gastroenteritis    HNP (herniated nucleus pulposus), lumbar    Hyperlipidemia    IBS (irritable bowel syndrome)    Lung nodule    rheumatoid arthritis    Sepsis (HCC) 05/22/2024   Sepsis due to pneumonia (HCC) 05/06/2024     HOME MEDICATIONS  Facility Ordered Medications  Medication   albuterol  (VENTOLIN  HFA) 108 (90 Base) MCG/ACT inhaler 2 puff   aspirin  EC tablet 81 mg   clonazepam  (KLONOPIN ) disintegrating tablet 0.25 mg   clopidogrel  (PLAVIX ) tablet 75 mg   ferrous sulfate  tablet 325 mg   DULoxetine  (CYMBALTA ) DR capsule 30 mg   colestipol  (COLESTID ) tablet 1 g   metoprolol  succinate (TOPROL -XL) 24 hr tablet  25 mg   multivitamin with minerals tablet 1 tablet   levETIRAcetam  (KEPPRA ) tablet 500 mg   leflunomide  (ARAVA ) tablet  20 mg   rosuvastatin  (CRESTOR ) tablet 20 mg   pregabalin  (LYRICA ) capsule 25 mg   pantoprazole  (PROTONIX ) EC tablet 40 mg   oxyCODONE  (Oxy IR/ROXICODONE ) immediate release tablet 5 mg   traZODone  (DESYREL ) tablet 25 mg   PTA Medications  Medication Sig   benralizumab  (FASENRA ) 30 MG/ML prefilled syringe Inject 1 mL (30 mg total) into the skin every 8 (eight) weeks. (Patient not taking: Reported on 06/17/2024)   leflunomide  (ARAVA ) 20 MG tablet Take 20 mg by mouth daily.   Fluticasone -Umeclidin-Vilant (TRELEGY ELLIPTA ) 200-62.5-25 MCG/ACT AEPB Inhale 1 puff into the lungs in the morning.   Certolizumab Pegol (CIMZIA Zinc) Inject 1 Dose into the skin every 14 (fourteen) days. (Patient not taking: Reported on 06/14/2024)   clindamycin (CLEOCIN T) 1 % lotion Apply 1 Application topically 2 (two) times daily. (Patient not taking: Reported on 06/17/2024)   acetaminophen  (TYLENOL ) 325 MG tablet Take 1-2 tablets (325-650 mg total) by mouth every 4 (four) hours as needed for mild pain (pain score 1-3). (Patient taking differently: Take 650 mg by mouth daily as needed for mild pain (pain score 1-3) or moderate pain (pain score 4-6).)   aspirin  EC 81 MG tablet Take 1 tablet (81 mg total) by mouth daily. Swallow whole.   albuterol  (VENTOLIN  HFA) 108 (90 Base) MCG/ACT inhaler INHALE 2 PUFFS INTO THE LUNGS EVERY 4 HOURS AS NEEDED FOR WHEEZING OR SHORTNESS OF BREATH   colestipol  (COLESTID ) 1 g tablet Take 1 g by mouth daily.   Multiple Vitamin (MULTIVITAMIN WITH MINERALS) TABS tablet Take 1 tablet by mouth daily.   ferrous sulfate  325 (65 FE) MG tablet Take 1 tablet (325 mg total) by mouth daily.   meclizine  (ANTIVERT ) 12.5 MG tablet Take 1 tablet (12.5 mg total) by mouth daily.   metoprolol  succinate (TOPROL -XL) 25 MG 24 hr tablet Take 1 tablet (25 mg total) by mouth daily.   clonazePAM  (KLONOPIN ) 0.25 MG disintegrating tablet Take 1 tablet (0.25 mg total) by mouth 2 (two) times daily as needed (Anxiety).    clopidogrel  (PLAVIX ) 75 MG tablet Take 1 tablet (75 mg total) by mouth daily.   DULoxetine  (CYMBALTA ) 30 MG capsule Take 1 capsule (30 mg total) by mouth 2 (two) times daily.   levETIRAcetam  (KEPPRA ) 500 MG tablet Take 1 tablet (500 mg total) by mouth 2 (two) times daily.   oxyCODONE  (OXY IR/ROXICODONE ) 5 MG immediate release tablet Take 1 tablet (5 mg total) by mouth every 6 (six) hours as needed for severe pain (pain score 7-10).   pantoprazole  (PROTONIX ) 40 MG tablet Take 1 tablet (40 mg total) by mouth daily.   pregabalin  (LYRICA ) 25 MG capsule Take 1 capsule (25 mg total) by mouth 2 (two) times daily.   rosuvastatin  (CRESTOR ) 20 MG tablet Take 1 tablet (20 mg total) by mouth daily.   traZODone  (DESYREL ) 50 MG tablet Take 0.5 tablets (25 mg total) by mouth at bedtime.     ALLERGIES  Allergies  Allergen Reactions   Hydromorphone  Anaphylaxis   Sulfa Antibiotics Rash    SOCIAL & SUBSTANCE USE HISTORY  Social History   Socioeconomic History   Marital status: Single    Spouse name: Not on file   Number of children: 1   Years of education: Not on file   Highest education level: GED or equivalent  Occupational History   Not on  file  Tobacco Use   Smoking status: Former    Current packs/day: 0.00    Average packs/day: 2.0 packs/day for 10.0 years (20.0 ttl pk-yrs)    Types: Cigarettes    Start date: 09/11/1978    Quit date: 09/11/1988    Years since quitting: 35.8   Smokeless tobacco: Never   Tobacco comments:    quit 1990  Vaping Use   Vaping status: Never Used  Substance and Sexual Activity   Alcohol use: No    Comment: stopped in 1990   Drug use: No    Comment: hx smoking marijuana and cocaine weekends. Stopped in the 1990s.   Sexual activity: Not Currently    Birth control/protection: Abstinence  Other Topics Concern   Not on file  Social History Narrative   Lives at home with her daughter & daughter's family (they live with her)   Right handed   Drinks no caffeine    Social Drivers of Corporate Investment Banker Strain: Low Risk  (04/04/2019)   Overall Financial Resource Strain (CARDIA)    Difficulty of Paying Living Expenses: Not hard at all  Food Insecurity: Patient Unable To Answer (06/18/2024)   Hunger Vital Sign    Worried About Programme Researcher, Broadcasting/film/video in the Last Year: Patient unable to answer    Ran Out of Food in the Last Year: Patient unable to answer  Transportation Needs: Patient Unable To Answer (06/18/2024)   PRAPARE - Transportation    Lack of Transportation (Medical): Patient unable to answer    Lack of Transportation (Non-Medical): Patient unable to answer  Physical Activity: Inactive (04/04/2019)   Exercise Vital Sign    Days of Exercise per Week: 0 days    Minutes of Exercise per Session: 0 min  Stress: No Stress Concern Present (04/04/2019)   [employer] of Occupational Health - Occupational Stress Questionnaire    Feeling of Stress : Only a little  Social Connections: Unknown (06/18/2024)   Social Connection and Isolation Panel    Frequency of Communication with Friends and Family: Patient unable to answer    Frequency of Social Gatherings with Friends and Family: Patient unable to answer    Attends Religious Services: Patient unable to answer    Active Member of Clubs or Organizations: Patient unable to answer    Attends Banker Meetings: Not on file    Marital Status: Not on file  Recent Concern: Social Connections - Moderately Isolated (05/23/2024)   Social Connection and Isolation Panel    Frequency of Communication with Friends and Family: Three times a week    Frequency of Social Gatherings with Friends and Family: Three times a week    Attends Religious Services: 1 to 4 times per year    Active Member of Clubs or Organizations: No    Attends Banker Meetings: Never    Marital Status: Never married   Social History   Tobacco Use  Smoking Status Former   Current packs/day: 0.00   Average  packs/day: 2.0 packs/day for 10.0 years (20.0 ttl pk-yrs)   Types: Cigarettes   Start date: 09/11/1978   Quit date: 09/11/1988   Years since quitting: 35.8  Smokeless Tobacco Never  Tobacco Comments   quit 1990   Social History   Substance and Sexual Activity  Alcohol Use No   Comment: stopped in 1990   Social History   Substance and Sexual Activity  Drug Use No   Comment: hx smoking marijuana  and cocaine weekends. Stopped in the 1990s.   She currently resides with her daughter, son in-law and grandsons, having moved in with them in October. The living situation has been marked by interpersonal conflict, including arguments, which contributed to her seeking police assistance and expressing a desire to leave the home. She does not wish to continue living with her family and has considered options such as shelters and nursing homes, though financial and insurance limitations have impacted her ability to transition to a senior living facility. Her family is otherwise located in [place] , and she receives Tree Surgeon benefits.   FAMILY HISTORY  Family History  Problem Relation Age of Onset   Heart disease Mother    Hypertension Mother    Asthma Mother    Heart disease Father    Stroke Father    Hypertension Father    Stroke Sister    Asthma Sister    Hypertension Brother    Asthma Brother    Asthma Maternal Grandmother    Heart disease Maternal Grandmother    Diabetes Maternal Grandfather    Asthma Maternal Aunt    Colon cancer Neg Hx    Family Psychiatric History (if known):  none reported  MENTAL STATUS EXAM (MSE)  Mental Status Exam: General Appearance: Well Groomed  Orientation:  Full (Time, Place, and Person)  Memory:  intact   Concentration:  fair  Recall:  intact   Attention  Fair  Eye Contact:  Fair  Speech:  aphagia  Language:  Fair  Volume:  Normal  Mood: I got so mad; overall mood described as frustrated and upset.  Affect:  Congruent  Thought  Process:  Coherent and Goal Directed  Thought Content:  There is no mention of auditory hallucinations, visual hallucinations, paranoia, obsessive thoughts, delusional thoughts, or intrusive thoughts.  Suicidal Thoughts:  No  Homicidal Thoughts:  No  Judgement:  Fair  Insight:  Fair  Psychomotor Activity:  Normal  Akathisia:  Negative  Fund of Knowledge:  Good    Assets:  Communication Skills Desire for Improvement Social Support  Cognition:  WNL  ADL's:  Intact  AIMS (if indicated):       VITALS  Blood pressure (!) 172/83, pulse 94, temperature 98.2 F (36.8 C), temperature source Oral, resp. rate 20, SpO2 98%.  LABS  Admission on 07/06/2024  Component Date Value Ref Range Status   WBC 07/06/2024 13.1 (H)  4.0 - 10.5 K/uL Final   RBC 07/06/2024 4.21  3.87 - 5.11 MIL/uL Final   Hemoglobin 07/06/2024 11.6 (L)  12.0 - 15.0 g/dL Final   HCT 87/90/7974 36.6  36.0 - 46.0 % Final   MCV 07/06/2024 86.9  80.0 - 100.0 fL Final   MCH 07/06/2024 27.6  26.0 - 34.0 pg Final   MCHC 07/06/2024 31.7  30.0 - 36.0 g/dL Final   RDW 87/90/7974 14.4  11.5 - 15.5 % Final   Platelets 07/06/2024 275  150 - 400 K/uL Final   nRBC 07/06/2024 0.0  0.0 - 0.2 % Final   Neutrophils Relative % 07/06/2024 60  % Final   Neutro Abs 07/06/2024 7.9 (H)  1.7 - 7.7 K/uL Final   Lymphocytes Relative 07/06/2024 32  % Final   Lymphs Abs 07/06/2024 4.3 (H)  0.7 - 4.0 K/uL Final   Monocytes Relative 07/06/2024 6  % Final   Monocytes Absolute 07/06/2024 0.7  0.1 - 1.0 K/uL Final   Eosinophils Relative 07/06/2024 1  % Final   Eosinophils Absolute 07/06/2024  0.1  0.0 - 0.5 K/uL Final   Basophils Relative 07/06/2024 1  % Final   Basophils Absolute 07/06/2024 0.1  0.0 - 0.1 K/uL Final   Immature Granulocytes 07/06/2024 0  % Final   Abs Immature Granulocytes 07/06/2024 0.04  0.00 - 0.07 K/uL Final   Performed at [facility], [address], [place], [place] [address]   Sodium 07/06/2024 143  135 -  145 mmol/L Final   Potassium 07/06/2024 4.0  3.5 - 5.1 mmol/L Final   Chloride 07/06/2024 106  98 - 111 mmol/L Final   CO2 07/06/2024 25  22 - 32 mmol/L Final   Glucose, Bld 07/06/2024 90  70 - 99 mg/dL Final   Glucose reference range applies only to samples taken after fasting for at least 8 hours.   BUN 07/06/2024 18  8 - 23 mg/dL Final   Creatinine, Ser 07/06/2024 0.83  0.44 - 1.00 mg/dL Final   Calcium  07/06/2024 10.5 (H)  8.9 - 10.3 mg/dL Final   Total Protein 87/90/7974 7.4  6.5 - 8.1 g/dL Final   Albumin 87/90/7974 4.0  3.5 - 5.0 g/dL Final   AST 87/90/7974 20  15 - 41 U/L Final   ALT 07/06/2024 12  0 - 44 U/L Final   Alkaline Phosphatase 07/06/2024 91  38 - 126 U/L Final   Total Bilirubin 07/06/2024 0.3  0.0 - 1.2 mg/dL Final   GFR, Estimated 07/06/2024 >60  >60 mL/min Final   Comment: (NOTE) Calculated using the CKD-EPI Creatinine Equation (2021)    Anion gap 07/06/2024 12  5 - 15 Final   Performed at [facility], [address] [place], [place] [address]   Alcohol, Ethyl (B) 07/06/2024 <15  <15 mg/dL Final   Comment: (NOTE) For medical purposes only. Performed at [facility], [address] [place], [place] [address]    Opiates 07/06/2024 NEGATIVE  NEGATIVE Final   Cocaine 07/06/2024 NEGATIVE  NEGATIVE Final   Benzodiazepines 07/06/2024 NEGATIVE  NEGATIVE Final   Amphetamines 07/06/2024 NEGATIVE  NEGATIVE Final   Tetrahydrocannabinol 07/06/2024 NEGATIVE  NEGATIVE Final   Barbiturates 07/06/2024 NEGATIVE  NEGATIVE Final   Methadone Scn, Ur 07/06/2024 NEGATIVE  NEGATIVE Final   Fentanyl  07/06/2024 NEGATIVE  NEGATIVE Final   Comment: (NOTE) Drug screen is for Medical Purposes only. Positive results are preliminary only. If confirmation is needed, notify lab within 5 days.  Drug Class                 Cutoff (ng/mL) Amphetamine and metabolites 1000 Barbiturate and metabolites 200 Benzodiazepine              200 Opiates and  metabolites     300 Cocaine and metabolites     300 THC                         50 Fentanyl                     5 Methadone                   300  Trazodone  is metabolized in vivo to several metabolites,  including pharmacologically active m-CPP, which is excreted in the  urine.  Immunoassay screens for amphetamines and MDMA have potential  cross-reactivity with these compounds and may provide false positive  result.  Performed at [facility], [address], [place], [place] [address]  Specimen Source 07/06/2024 URINE, CLEAN CATCH   Final   Color, Urine 07/06/2024 YELLOW  YELLOW Final   APPearance 07/06/2024 HAZY (A)  CLEAR Final   Specific Gravity, Urine 07/06/2024 1.021  1.005 - 1.030 Final   pH 07/06/2024 5.0  5.0 - 8.0 Final   Glucose, UA 07/06/2024 NEGATIVE  NEGATIVE mg/dL Final   Hgb urine dipstick 07/06/2024 NEGATIVE  NEGATIVE Final   Bilirubin Urine 07/06/2024 NEGATIVE  NEGATIVE Final   Ketones, ur 07/06/2024 NEGATIVE  NEGATIVE mg/dL Final   Protein, ur 87/90/7974 30 (A)  NEGATIVE mg/dL Final   Nitrite 87/90/7974 NEGATIVE  NEGATIVE Final   Leukocytes,Ua 07/06/2024 SMALL (A)  NEGATIVE Final   RBC / HPF 07/06/2024 0-5  0 - 5 RBC/hpf Final   WBC, UA 07/06/2024 0-5  0 - 5 WBC/hpf Final   Comment:        Reflex urine culture not performed if WBC <=10, OR if Squamous epithelial cells >5. If Squamous epithelial cells >5 suggest recollection.    Bacteria, UA 07/06/2024 NONE SEEN  NONE SEEN Final   Squamous Epithelial / HPF 07/06/2024 0-5  0 - 5 /HPF Final   Mucus 07/06/2024 PRESENT   Final   Hyaline Casts, UA 07/06/2024 PRESENT   Final   Performed at [facility], [address], [place], [place] [address]    PSYCHIATRIC REVIEW OF SYSTEMS (ROS)  ROS: Notable for the following relevant positive findings: ROS  Additional findings:      Musculoskeletal: No abnormal movements observed      Gait & Station: Laying/Sitting      Pain  Screening: Denies      Nutrition & Dental Concerns: none reported  RISK FORMULATION/ASSESSMENT  Is the patient experiencing any suicidal or homicidal ideations: No       Explain if yes:  Protective factors considered for safety management:  include engagement with medical care, willingness to seek help, receipt of Social Security, and motivation to find appropriate placement. She denied active or passive thoughts of suicide, denied any plan or history of self-harm, and denied homicidal ideation  Risk factors/concerns considered for safety management:  include advanced age, recent stroke, multiple hospitalizations, history of falls, and current family conflict resulting in housing instability  Is there a safety management plan with the patient and treatment team to minimize risk factors and promote protective factors: Yes           Explain: initiate case management and SW consult Is crisis care placement or psychiatric hospitalization recommended: No     Based on my current evaluation and risk assessment, patient is determined at this time to be at:  Low risk  *RISK ASSESSMENT Risk assessment is a dynamic process; it is possible that this patient's condition, and risk level, may change. This should be re-evaluated and managed over time as appropriate. Please re-consult psychiatric consult services if additional assistance is needed in terms of risk assessment and management. If your team decides to discharge this patient, please advise the patient how to best access emergency psychiatric services, or to call 911, if their condition worsens or they feel unsafe in any way.   Ines Hock, NP Telepsychiatry Consult Services

## 2024-07-07 NOTE — Discharge Instructions (Addendum)
 Please coordinate with outpatient social work for outpatient memory care placement.

## 2024-07-07 NOTE — ED Notes (Signed)
 Medication delay due to waiting for medication from Main Pharmacy.

## 2024-07-07 NOTE — Progress Notes (Signed)
 CSW spoke with pt's daughter who reports they were in the process of moving from [place] to [place] when the pt suffered a stroke.   CSW informed that per chart review, pt ambulated 150 feet and does not qualify for rehab as a result. CSW also informed pt was assessed by psychiatry team who determined she did not meet criteria for inpatient psychiatric admission.   CSW explained that if they are interested in memory care, she should start with DSS to determine if pt qualifies for LTC Medicaid. Resources attached to [facility]. CSW also offered referral to [facility] which daughter accepted. Referral provided to [facility] securely. Daughter verbalized understanding and thanked this clinical research associate. She will pick pt up at 1pm. EDP and RN notified via secure chat.

## 2024-07-07 NOTE — ED Notes (Addendum)
 Patient is awake and watching television. Patient is A&Ox4, non combative, and cooperative. 1:1 sitter reports no incidents.

## 2024-07-07 NOTE — ED Notes (Addendum)
 Psychiatric eval completed inpt psych not recommended

## 2024-07-16 ENCOUNTER — Ambulatory Visit: Payer: Self-pay | Admitting: Cardiology

## 2024-07-21 ENCOUNTER — Emergency Department (HOSPITAL_COMMUNITY)
Admission: EM | Admit: 2024-07-21 | Discharge: 2024-07-21 | Disposition: A | Discharge status: Home / Self Care | Source: Ambulatory Visit | Attending: Emergency Medicine | Admitting: Emergency Medicine

## 2024-07-21 ENCOUNTER — Encounter: Payer: Self-pay | Admitting: Registered Nurse

## 2024-07-21 ENCOUNTER — Encounter: Attending: Registered Nurse | Admitting: Registered Nurse

## 2024-07-21 ENCOUNTER — Emergency Department (HOSPITAL_COMMUNITY)

## 2024-07-21 VITALS — BP 142/84 | HR 79 | Ht 64.0 in | Wt 129.8 lb

## 2024-07-21 DIAGNOSIS — W19XXXD Unspecified fall, subsequent encounter: Secondary | ICD-10-CM | POA: Insufficient documentation

## 2024-07-21 DIAGNOSIS — Z87891 Personal history of nicotine dependence: Secondary | ICD-10-CM | POA: Insufficient documentation

## 2024-07-21 DIAGNOSIS — Y92009 Unspecified place in unspecified non-institutional (private) residence as the place of occurrence of the external cause: Secondary | ICD-10-CM | POA: Diagnosis not present

## 2024-07-21 DIAGNOSIS — N644 Mastodynia: Secondary | ICD-10-CM | POA: Insufficient documentation

## 2024-07-21 DIAGNOSIS — R4701 Aphasia: Secondary | ICD-10-CM | POA: Insufficient documentation

## 2024-07-21 DIAGNOSIS — R079 Chest pain, unspecified: Secondary | ICD-10-CM | POA: Insufficient documentation

## 2024-07-21 DIAGNOSIS — K5730 Diverticulosis of large intestine without perforation or abscess without bleeding: Secondary | ICD-10-CM | POA: Diagnosis not present

## 2024-07-21 DIAGNOSIS — M79671 Pain in right foot: Secondary | ICD-10-CM | POA: Insufficient documentation

## 2024-07-21 DIAGNOSIS — I700 Atherosclerosis of aorta: Secondary | ICD-10-CM | POA: Insufficient documentation

## 2024-07-21 DIAGNOSIS — I10 Essential (primary) hypertension: Secondary | ICD-10-CM | POA: Insufficient documentation

## 2024-07-21 DIAGNOSIS — J432 Centrilobular emphysema: Secondary | ICD-10-CM | POA: Diagnosis not present

## 2024-07-21 DIAGNOSIS — J4489 Other specified chronic obstructive pulmonary disease: Secondary | ICD-10-CM | POA: Insufficient documentation

## 2024-07-21 DIAGNOSIS — M4802 Spinal stenosis, cervical region: Secondary | ICD-10-CM | POA: Diagnosis not present

## 2024-07-21 DIAGNOSIS — I639 Cerebral infarction, unspecified: Secondary | ICD-10-CM | POA: Diagnosis not present

## 2024-07-21 DIAGNOSIS — Z8673 Personal history of transient ischemic attack (TIA), and cerebral infarction without residual deficits: Secondary | ICD-10-CM | POA: Insufficient documentation

## 2024-07-21 DIAGNOSIS — M546 Pain in thoracic spine: Secondary | ICD-10-CM | POA: Insufficient documentation

## 2024-07-21 DIAGNOSIS — M797 Fibromyalgia: Secondary | ICD-10-CM | POA: Insufficient documentation

## 2024-07-21 DIAGNOSIS — K760 Fatty (change of) liver, not elsewhere classified: Secondary | ICD-10-CM | POA: Diagnosis not present

## 2024-07-21 DIAGNOSIS — S9031XA Contusion of right foot, initial encounter: Secondary | ICD-10-CM | POA: Insufficient documentation

## 2024-07-21 DIAGNOSIS — Z7982 Long term (current) use of aspirin: Secondary | ICD-10-CM | POA: Diagnosis not present

## 2024-07-21 DIAGNOSIS — X58XXXA Exposure to other specified factors, initial encounter: Secondary | ICD-10-CM | POA: Diagnosis not present

## 2024-07-21 DIAGNOSIS — M2578 Osteophyte, vertebrae: Secondary | ICD-10-CM | POA: Diagnosis not present

## 2024-07-21 DIAGNOSIS — E785 Hyperlipidemia, unspecified: Secondary | ICD-10-CM | POA: Diagnosis not present

## 2024-07-21 DIAGNOSIS — E7849 Other hyperlipidemia: Secondary | ICD-10-CM | POA: Diagnosis not present

## 2024-07-21 DIAGNOSIS — M069 Rheumatoid arthritis, unspecified: Secondary | ICD-10-CM | POA: Insufficient documentation

## 2024-07-21 DIAGNOSIS — I6340 Cerebral infarction due to embolism of unspecified cerebral artery: Secondary | ICD-10-CM | POA: Diagnosis not present

## 2024-07-21 LAB — CBC WITH DIFFERENTIAL/PLATELET
Abs Immature Granulocytes: 0.04 K/uL (ref 0.00–0.07)
Basophils Absolute: 0.1 K/uL (ref 0.0–0.1)
Basophils Relative: 1 %
Eosinophils Absolute: 0.2 K/uL (ref 0.0–0.5)
Eosinophils Relative: 2 %
HCT: 33.8 % — ABNORMAL LOW (ref 36.0–46.0)
Hemoglobin: 10.8 g/dL — ABNORMAL LOW (ref 12.0–15.0)
Immature Granulocytes: 0 %
Lymphocytes Relative: 31 %
Lymphs Abs: 3.6 K/uL (ref 0.7–4.0)
MCH: 27.8 pg (ref 26.0–34.0)
MCHC: 32 g/dL (ref 30.0–36.0)
MCV: 86.9 fL (ref 80.0–100.0)
Monocytes Absolute: 0.7 K/uL (ref 0.1–1.0)
Monocytes Relative: 6 %
Neutro Abs: 7.1 K/uL (ref 1.7–7.7)
Neutrophils Relative %: 60 %
Platelets: 217 K/uL (ref 150–400)
RBC: 3.89 MIL/uL (ref 3.87–5.11)
RDW: 15.2 % (ref 11.5–15.5)
WBC: 11.6 K/uL — ABNORMAL HIGH (ref 4.0–10.5)
nRBC: 0 % (ref 0.0–0.2)

## 2024-07-21 LAB — PROTIME-INR
INR: 1.1 (ref 0.8–1.2)
Prothrombin Time: 14.4 s (ref 11.4–15.2)

## 2024-07-21 LAB — COMPREHENSIVE METABOLIC PANEL WITH GFR
ALT: 10 U/L (ref 0–44)
AST: 24 U/L (ref 15–41)
Albumin: 4 g/dL (ref 3.5–5.0)
Alkaline Phosphatase: 69 U/L (ref 38–126)
Anion gap: 11 (ref 5–15)
BUN: 11 mg/dL (ref 8–23)
CO2: 26 mmol/L (ref 22–32)
Calcium: 10.1 mg/dL (ref 8.9–10.3)
Chloride: 107 mmol/L (ref 98–111)
Creatinine, Ser: 0.88 mg/dL (ref 0.44–1.00)
GFR, Estimated: >60 mL/min
Glucose, Bld: 101 mg/dL — ABNORMAL HIGH (ref 70–99)
Potassium: 3.2 mmol/L — ABNORMAL LOW (ref 3.5–5.1)
Sodium: 143 mmol/L (ref 135–145)
Total Bilirubin: 0.4 mg/dL (ref 0.0–1.2)
Total Protein: 6.9 g/dL (ref 6.5–8.1)

## 2024-07-21 LAB — TROPONIN T, HIGH SENSITIVITY
Troponin T High Sensitivity: 30 ng/L — ABNORMAL HIGH (ref 0–19)
Troponin T High Sensitivity: 27 ng/L — ABNORMAL HIGH (ref 0–19)

## 2024-07-21 MED ORDER — HYDROCODONE-ACETAMINOPHEN 5-325 MG PO TABS
1.0000 | ORAL_TABLET | Freq: Once | ORAL | Status: AC
  Administered 2024-07-21: 1 via ORAL
  Filled 2024-07-21: qty 1

## 2024-07-21 MED ORDER — HYDROCODONE-ACETAMINOPHEN 5-325 MG PO TABS: 1.0000 | ORAL_TABLET | Freq: Four times a day (QID) | ORAL | 0 refills | Status: AC | PRN

## 2024-07-21 NOTE — ED Triage Notes (Signed)
 POV/ brought in by daughter/ pt c/o left side pain, denies chest pain, recent loop recorder placed to left side/ duaghter concerned that's what causing the pain/ multiple falls/right  foot pain/ placed in wheelchair

## 2024-07-21 NOTE — ED Triage Notes (Signed)
 Multiple complaints. PT complains of left and right side pain that radiates from back that started 3 days ago. Family concerned that pain is coming from loop recorder. Pt fell twice yesterday due to being off balance denies any injury or LOC. Pt is on plavix . Also complains of bruising and swelling to right foot that appeared 2 days ago denies any known injury. Bruising and swelling was visible before the falls happened.

## 2024-07-21 NOTE — ED Provider Notes (Signed)
 " [place] EMERGENCY DEPARTMENT AT [facility] Provider Note   CSN: [id number] Arrival date & time: 07/21/24  1127     Patient presents with: Pain   Betty Golden is a 78 y.o. female history of recent stroke on Plavix , loop recorder here presenting with chest pain and right foot pain.  Patient states that she woke up today and she had right foot pain.  Denies any trauma or injury.  She also has some chest pain as well.  She was concerned about her loop recorder.  However denies any fever or chills or pain around the loop recorder site.   The history is provided by the patient.       Prior to Admission medications  Medication Sig Start Date End Date Taking? Authorizing Provider  acetaminophen  (TYLENOL ) 325 MG tablet Take 1-2 tablets (325-650 mg total) by mouth every 4 (four) hours as needed for mild pain (pain score 1-3). Patient taking differently: Take 650 mg by mouth daily as needed for mild pain (pain score 1-3) or moderate pain (pain score 4-6). 04/19/24   Love, Sharlet RAMAN, PA-C  albuterol  (VENTOLIN  HFA) 108 (90 Base) MCG/ACT inhaler INHALE 2 PUFFS INTO THE LUNGS EVERY 4 HOURS AS NEEDED FOR WHEEZING OR SHORTNESS OF BREATH 06/03/24   Iva Marty Saltness, MD  aspirin  EC 81 MG tablet Take 1 tablet (81 mg total) by mouth daily. Swallow whole. 05/27/24   Dibia, Pauline E, MD  benralizumab  (FASENRA ) 30 MG/ML prefilled syringe Inject 1 mL (30 mg total) into the skin every 8 (eight) weeks. 07/07/24   Iva Marty Saltness, MD  Certolizumab Pegol (CIMZIA [place]) Inject 1 Dose into the skin every 14 (fourteen) days.    [provider]  clindamycin (CLEOCIN T) 1 % lotion Apply 1 Application topically 2 (two) times daily.    [provider]  clonazePAM  (KLONOPIN ) 0.25 MG disintegrating tablet Take 1 tablet (0.25 mg total) by mouth 2 (two) times daily as needed (Anxiety). 07/01/24   Angiulli, Toribio JINNY, PA-C  clopidogrel  (PLAVIX ) 75 MG tablet Take 1 tablet (75 mg total)  by mouth daily. 07/01/24 09/29/24  Angiulli, Toribio JINNY, PA-C  colestipol  (COLESTID ) 1 g tablet Take 1 g by mouth daily.    [provider]  DULoxetine  (CYMBALTA ) 30 MG capsule Take 1 capsule (30 mg total) by mouth 2 (two) times daily. 07/01/24   Angiulli, Toribio JINNY, PA-C  ferrous sulfate  325 (65 FE) MG tablet Take 1 tablet (325 mg total) by mouth daily. 07/01/24   Angiulli, Toribio JINNY, PA-C  Fluticasone -Umeclidin-Vilant (TRELEGY ELLIPTA ) 200-62.5-25 MCG/ACT AEPB Inhale 1 puff into the lungs in the morning. 10/15/23   Iva Marty Saltness, MD  leflunomide  (ARAVA ) 20 MG tablet Take 20 mg by mouth daily.    [provider]  levETIRAcetam  (KEPPRA ) 500 MG tablet Take 1 tablet (500 mg total) by mouth 2 (two) times daily. 07/01/24   Angiulli, Toribio JINNY, PA-C  meclizine  (ANTIVERT ) 12.5 MG tablet Take 1 tablet (12.5 mg total) by mouth daily. 07/01/24   Angiulli, Toribio JINNY, PA-C  metoprolol  succinate (TOPROL -XL) 25 MG 24 hr tablet Take 1 tablet (25 mg total) by mouth daily. 07/01/24   Angiulli, Daniel J, PA-C  Multiple Vitamin (MULTIVITAMIN WITH MINERALS) TABS tablet Take 1 tablet by mouth daily. 07/01/24   Angiulli, Toribio JINNY, PA-C  oxyCODONE  (OXY IR/ROXICODONE ) 5 MG immediate release tablet Take 1 tablet (5 mg total) by mouth every 6 (six) hours as needed for severe pain (pain score  7-10). 07/01/24   Angiulli, Toribio PARAS, PA-C  pantoprazole  (PROTONIX ) 40 MG tablet Take 1 tablet (40 mg total) by mouth daily. 07/01/24   Angiulli, Toribio PARAS, PA-C  pregabalin  (LYRICA ) 25 MG capsule Take 1 capsule (25 mg total) by mouth 2 (two) times daily. 07/01/24   Angiulli, Toribio PARAS, PA-C  rosuvastatin  (CRESTOR ) 20 MG tablet Take 1 tablet (20 mg total) by mouth daily. 07/01/24   Angiulli, Toribio PARAS, PA-C  traZODone  (DESYREL ) 50 MG tablet Take 0.5 tablets (25 mg total) by mouth at bedtime. 07/01/24   Angiulli, Toribio PARAS, PA-C    Allergies: Hydromorphone  and Sulfa antibiotics    Review of Systems  Musculoskeletal:        R foot  pain   All other systems reviewed and are negative.   Updated Vital Signs BP (!) 154/96 (BP Location: Left Arm)   Pulse 77   Temp 97.6 F (36.4 C) (Oral)   Resp 17   SpO2 99%   Physical Exam Vitals and nursing note reviewed.  Constitutional:      Appearance: Normal appearance.  HENT:     Head: Normocephalic.     Nose: Nose normal.     Mouth/Throat:     Mouth: Mucous membranes are moist.  Eyes:     Extraocular Movements: Extraocular movements intact.     Pupils: Pupils are equal, round, and reactive to light.  Cardiovascular:     Rate and Rhythm: Normal rate and regular rhythm.     Pulses: Normal pulses.     Heart sounds: Normal heart sounds.  Pulmonary:     Effort: Pulmonary effort is normal.     Breath sounds: Normal breath sounds.     Comments: Loop recorder site with no obvious redness  Abdominal:     General: Abdomen is flat.     Palpations: Abdomen is soft.  Musculoskeletal:     Cervical back: Normal range of motion and neck supple.     Comments: Ecchymosis R foot no obvious deformity   Skin:    General: Skin is warm.     Capillary Refill: Capillary refill takes less than 2 seconds.  Neurological:     General: No focal deficit present.     Mental Status: She is alert and oriented to person, place, and time.  Psychiatric:        Mood and Affect: Mood normal.        Behavior: Behavior normal.     (all labs ordered are listed, but only abnormal results are displayed) Labs Reviewed  COMPREHENSIVE METABOLIC PANEL WITH GFR - Abnormal; Notable for the following components:      Result Value   Potassium 3.2 (*)    Glucose, Bld 101 (*)    All other components within normal limits  CBC WITH DIFFERENTIAL/PLATELET - Abnormal; Notable for the following components:   WBC 11.6 (*)    Hemoglobin 10.8 (*)    HCT 33.8 (*)    All other components within normal limits  TROPONIN T, HIGH SENSITIVITY - Abnormal; Notable for the following components:   Troponin T High  Sensitivity 30 (*)    All other components within normal limits  PROTIME-INR  TROPONIN T, HIGH SENSITIVITY    EKG: None  Radiology: DG Chest 2 View Result Date: 07/21/2024 CLINICAL DATA:  Back pain for 3 days EXAM: DG CHEST 2V COMPARISON:  June 16, 2024 FINDINGS: The heart size and mediastinal contours are within normal limits. Stable minimal left basilar scarring. No  acute pulmonary disease is noted. The visualized skeletal structures are unremarkable. IMPRESSION: No active cardiopulmonary disease. Electronically Signed   By: Lynwood Landy Raddle M.D.   On: 07/21/2024 13:08   DG Foot Complete Right Result Date: 07/21/2024 CLINICAL DATA:  Right foot bruising and swelling for 2 days without known injury EXAM: RIGHT FOOT COMPLETE - 3+ VIEW COMPARISON:  April 19, 2019 FINDINGS: No acute fracture or dislocation is noted. Old healed distal second, third and fourth metatarsal fractures are noted. Minimal degenerative changes seen involving first metatarsophalangeal joint. IMPRESSION: No acute abnormality seen. Electronically Signed   By: Lynwood Landy Raddle M.D.   On: 07/21/2024 13:07     Procedures   Medications Ordered in the ED  HYDROcodone -acetaminophen  (NORCO/VICODIN) 5-325 MG per tablet 1 tablet (has no administration in time range)                                    Medical Decision Making MERDITH ADAN is a 78 y.o. female here presenting with right foot pain and chest pain.  I think she likely has a foot contusion.  For her chest pain, I have low suspicion of ACS and her loop recorder was interrogated about a week ago and patient has no arrhythmias.  Plan to get troponin x 2 and labs and chest x-ray.  Will give pain meds and reassess  5:48 PM I reviewed patient's labs and initial troponin was 30 and repeat was 27.  X-ray of the left foot and chest x-ray did not show any fracture or pneumonia.  Patient likely have right foot contusion causing her pain.  Unclear why she has chest  pain but it may be pain from the loop recorder.  She can follow-up with her cardiologist outpatient  Problems Addressed: Chest pain, unspecified type: acute illness or injury Contusion of right foot, initial encounter: acute illness or injury  Amount and/or Complexity of Data Reviewed Labs: ordered. Decision-making details documented in ED Course. Radiology: ordered and independent interpretation performed. Decision-making details documented in ED Course.  Risk Prescription drug management.    Final diagnoses:  None    ED Discharge Orders     None          Patt Alm Macho, MD 07/21/24 1749  "

## 2024-07-21 NOTE — ED Provider Triage Note (Signed)
 Emergency Medicine Provider Triage Evaluation Note  Betty Golden , a 78 y.o. female  was evaluated in triage.  Pt complains of chest discomfort about the lower ribs, as well as pain and bruising to the right dorsal foot.  Noted to have a red an painful area to the dorsal right foot w/ worsening bruising since.  .  Review of Systems  Positive: As above Negative:   Physical Exam  BP (!) 154/96 (BP Location: Left Arm)   Pulse 77   Temp 97.6 F (36.4 C) (Oral)   Resp 17   SpO2 99%  Gen:   Awake, no distress  Resp:  Normal effort MSK:   Moves extremities without difficulty, tenderness to dorsal right foot, erythema to 5th MTP Other:  Pain w/ palpation of rib cage at lower margin. Normal pulmonary auscultation.  Medical Decision Making  Medically screening exam initiated at 12:20 PM.  Appropriate orders placed.  Betty Golden was informed that the remainder of the evaluation will be completed by another provider, this initial triage assessment does not replace that evaluation, and the importance of remaining in the ED until their evaluation is complete.  Orders placed for imaging/labs.   Betty Golden, Betty Golden 07/21/24 [phone]

## 2024-07-21 NOTE — Discharge Instructions (Addendum)
 As we discussed your heart enzyme test are stable today  Please take Vicodin for severe pain  As we discussed you likely have a foot contusion  See your doctor for follow-up.  Follow-up with your cardiologist regarding your chest pain  Return to ER if you have worse chest pain or foot pain

## 2024-07-21 NOTE — Progress Notes (Signed)
 "  Subjective:    Patient ID: Betty Golden, female    DOB: 1946-06-01, 78 y.o.   MRN: [id number]  HPI: Betty Golden is a 78 y.o. female who is here for HFU appointment for F/U of her Cardio embolic stroke, Essential Hypertension, Hyperlipidemia and acute right foot pain. She also reports left thoracic pain, she denies chest pain, denies sternal pain or SOB. Her daughter reports onset of pain was on Monday 07/19/2024, she didn't seek medical attention. Daughter refuses EMS, she states she will  take her mother to ED to be evaluated.   She presented to [facility] ED on 06/16/2024, after a fall at home and becoming dizzy with altered mental status.     Dr. Seena H&P: 06/16/2024 HPI: Betty Golden is a 78 y.o. female with medical history significant of hypertension, hyperlipidemia, CVA, residual expressive aphasia, GERD, depression, rheumatoid arthritis, asthma, COPD, anemia, spinal stenosis, fibromyalgia presenting after a fall at home.   History obtained with assistance of chart review and family.  Patient reportedly got dizzy and had a fall last night at home.  She did hit the back of her neck and her back.  Noted to have some abdominal pain and leg pain afterwards.  Has history of prior CVA and incidental repeat CVA noted last month.  And has slurred speech/expressive aphasia at baseline.  This had been worse recently with some increased confusion.   While in the ED patient had episode where she stopped talking and began to stare off.  Aphasia has been worse since that as well.   Patient denies fevers, chills, chest pain, shortness of breath, abdominal pain, diarrhea, nausea, vomiting  CT: Head WO Contrast IMPRESSION: 1. No acute intracranial abnormality. 2. Chronic left parietal lobe infarct. 3. Motion artifact limiting evaluation. 4. These results were communicated to Dr. Matthews at 1:04 PM on 06/16/2024 by secure text page via the [facility] messaging system.  CT: Cervical  Spine IMPRESSION: 1. No acute cervical spine injury related to the reported trauma. 2. 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 3. Moderate left neural foraminal stenosis at C3-4, C4-5, and C5-6.  CT Chest: Abdomen and Pelvis  IMPRESSION: 1. No acute findings in the chest, abdomen or pelvis. 2. Mild centrilobular emphysematous disease. 3. Mild hepatic steatosis. 4. Colonic diverticulosis without active inflammation. 5. Aortic atherosclerosis.   Aortic Atherosclerosis (ICD10-I70.0) and Emphysema (ICD10-J43.9).  MR Brain: WO Contrast IMPRESSION: 1. Punctate acute infarct in the right precentral gyrus. 2. Chronic ischemia with multiple old infarcts as above, including evolving late subacute to chronic bilateral MCA infarcts.  [name] was admitted to inpatient  rehabilitation on 06/22/2024 and discharged home on 07/02/2024. She is receiving Home Health Therapy with [facility].   She  states her pain is located in left breast/ left thoracic region, denies sternal pain or SOB. Her daughter states she will take her to ED to be evaluated, she refuses fro EMS to be called. Also reports right foot pain, resolving ecchymosis noted, X-ray ordered. [name] unaware how her right foot became bruised. She rates her  pain 9. Her current exercise regime is walking with her walker.  [name] reports her appetite is inmproving.  She reports she had two falls on yesterday, ome she was walking in the home, lost her balanced and landed into the christmas tree and sat on presents, she states she had  her walker and was able to pick herself up.   The other  fall she walking into the home, she wasn't using her walker and was able to brace herself with the sofa and landed on her knees, she states. . She reports she was able to pick herself up, she did not seek medical attention for either falls and denies hitting her head. Educated on falls prevention, she verbalizes  understanding.   Daughter in room    Pain Inventory Average Pain 9 Pain Right Now 9 My pain is sharp  LOCATION OF PAIN  mostly in her left side related to the loop recorder  BOWEL       Number of stools per week: . Oral laxative use Yes  Type of laxative linzess  BLADDER Normal  Mobility use a walker ability to climb steps?  yes do you drive?  no  Function I need assistance with the following:  dressing, bathing, meal prep, household duties, and shopping  Neuro/Psych weakness trouble walking  Prior Studies Any changes since last visit?  yes  has appt with Neuro next week, cardiology next month and PCP in 2 weeks.  HH therapies are ongoing  Physicians involved in your care Any changes since last visit?  no   Family History  Problem Relation Age of Onset   Heart disease Mother    Hypertension Mother    Asthma Mother    Heart disease Father    Stroke Father    Hypertension Father    Stroke Sister    Asthma Sister    Hypertension Brother    Asthma Brother    Asthma Maternal Grandmother    Heart disease Maternal Grandmother    Diabetes Maternal Grandfather    Asthma Maternal Aunt    Colon cancer Neg Hx    Social History   Socioeconomic History   Marital status: Single    Spouse name: Not on file   Number of children: 1   Years of education: Not on file   Highest education level: GED or equivalent  Occupational History   Not on file  Tobacco Use   Smoking status: Former    Current packs/day: 0.00    Average packs/day: 2.0 packs/day for 10.0 years (20.0 ttl pk-yrs)    Types: Cigarettes    Start date: 09/11/1978    Quit date: 09/11/1988    Years since quitting: 35.8   Smokeless tobacco: Never   Tobacco comments:    quit 1990  Vaping Use   Vaping status: Never Used  Substance and Sexual Activity   Alcohol use: No    Comment: stopped in 1990   Drug use: No    Comment: hx smoking marijuana and cocaine weekends. Stopped in the 1990s.   Sexual  activity: Not Currently    Birth control/protection: Abstinence  Other Topics Concern   Not on file  Social History Narrative   Lives at home with her daughter & daughter's family (they live with her)   Right handed   Drinks no caffeine   Social Drivers of Health   Tobacco Use: Medium Risk (06/27/2024)   Patient History    Smoking Tobacco Use: Former    Smokeless Tobacco Use: Never    Passive Exposure: Not on Actuary Strain: Not on file  Food Insecurity: Patient Unable To Answer (06/18/2024)   Epic    Worried About Programme Researcher, Broadcasting/film/video in the Last Year: Patient unable to answer    Ran Out of Food in the Last Year: Patient unable to answer  Transportation Needs: Patient  Unable To Answer (06/18/2024)   Epic    Lack of Transportation (Medical): Patient unable to answer    Lack of Transportation (Non-Medical): Patient unable to answer  Physical Activity: Not on file  Stress: Not on file  Social Connections: Unknown (06/18/2024)   Social Connection and Isolation Panel    Frequency of Communication with Friends and Family: Patient unable to answer    Frequency of Social Gatherings with Friends and Family: Patient unable to answer    Attends Religious Services: Patient unable to answer    Active Member of Clubs or Organizations: Patient unable to answer    Attends Banker Meetings: Not on file    Marital Status: Not on file  Recent Concern: Social Connections - Moderately Isolated (05/23/2024)   Social Connection and Isolation Panel    Frequency of Communication with Friends and Family: Three times a week    Frequency of Social Gatherings with Friends and Family: Three times a week    Attends Religious Services: 1 to 4 times per year    Active Member of Clubs or Organizations: No    Attends Banker Meetings: Never    Marital Status: Never married  Depression (PHQ2-9): Not on file  Alcohol Screen: Not on file  Housing: Unknown  (06/18/2024)   Epic    Unable to Pay for Housing in the Last Year: Patient unable to answer    Number of Times Moved in the Last Year: 0    Homeless in the Last Year: Not on file  Utilities: Patient Unable To Answer (06/18/2024)   Epic    Threatened with loss of utilities: Patient unable to answer  Health Literacy: Not on file   Past Surgical History:  Procedure Laterality Date   APPENDECTOMY  1965   BIOPSY  08/26/2018   Procedure: BIOPSY;  Surgeon: Golda Claudis PENNER, MD;  Location: [facility];  Service: Endoscopy;;  gastric   BREAST BIOPSY     BREAST REDUCTION SURGERY  1994   CATARACT EXTRACTION W/PHACO Left 06/09/2023   Procedure: CATARACT EXTRACTION PHACO AND INTRAOCULAR LENS PLACEMENT (IOC);  Surgeon: Harrie Agent, MD;  Location: [facility];  Service: Ophthalmology;  Laterality: Left;  CDE 7.03   CATARACT EXTRACTION W/PHACO Right 06/23/2023   Procedure: CATARACT EXTRACTION PHACO AND INTRAOCULAR LENS PLACEMENT (IOC);  Surgeon: Harrie Agent, MD;  Location: [facility];  Service: Ophthalmology;  Laterality: Right;  CDE: 11.55   CHOLECYSTECTOMY  1975   COLONOSCOPY N/A 08/19/2013   Procedure: COLONOSCOPY;  Surgeon: Claudis PENNER Golda, MD;  Location: [facility];  Service: Endoscopy;  Laterality: N/A;  155-moved to 140 Ann to notify pt   COLONOSCOPY N/A 08/26/2018   Procedure: COLONOSCOPY;  Surgeon: Golda Claudis PENNER, MD;  Location: [facility];  Service: Endoscopy;  Laterality: N/A;   ESOPHAGOGASTRODUODENOSCOPY N/A 08/26/2018   Procedure: ESOPHAGOGASTRODUODENOSCOPY (EGD);  Surgeon: Golda Claudis PENNER, MD;  Location: [facility];  Service: Endoscopy;  Laterality: N/A;   fibromyalgia     LOOP RECORDER INSERTION N/A 05/26/2024   Procedure: LOOP RECORDER INSERTION;  Surgeon: Lesia Ozell Barter, PA-C;  Location: [facility] INVASIVE CV LAB;  Service: Cardiovascular;  Laterality: N/A;   LUMBAR LAMINECTOMY/DECOMPRESSION MICRODISCECTOMY Bilateral 09/20/2015   Procedure: MICRO LUMBAR DECOMPRESSION L5-S1  BILATERAL    (1 LEVEL);  Surgeon: Reyes Billing, MD;  Location: [facility];  Service: Orthopedics;  Laterality: Bilateral;   rheumatoid arthritis     Sinus Surgery     TONSILLECTOMY  1968  TRANSESOPHAGEAL ECHOCARDIOGRAM (CATH LAB) N/A 05/25/2024   Procedure: TRANSESOPHAGEAL ECHOCARDIOGRAM;  Surgeon: Lonni Slain, MD;  Location: [facility] INVASIVE CV LAB;  Service: Cardiovascular;  Laterality: N/A;   Past Medical History:  Diagnosis Date   Acute ischemic left middle cerebral artery (MCA) stroke (HCC) 04/17/2024   Asthma    C. difficile colitis    Cerebrovascular accident (CVA) due to embolism of precerebral artery (HCC) 05/25/2024   Cervical stenosis of spine    Chronic headaches    Chronic neck pain    Colitis due to Clostridium difficile 02/10/2015   Coronary artery disease    Depression    Diverticulosis    Essential hypertension    Fibromyalgia    GERD (gastroesophageal reflux disease)    History of CVA (cerebrovascular accident) 05/06/2024   History of Salmonella gastroenteritis    HNP (herniated nucleus pulposus), lumbar    Hyperlipidemia    IBS (irritable bowel syndrome)    Lung nodule    rheumatoid arthritis    Sepsis (HCC) 05/22/2024   Sepsis due to pneumonia (HCC) 05/06/2024   BP (!) 142/84   Pulse 79   Ht 5' 4 (1.626 m)   Wt 129 lb 12.8 oz (58.9 kg)   SpO2 91%   BMI 22.28 kg/m   Opioid Risk Score:   Fall Risk Score:  `1  Depression screen [facility] 2/9     07/21/2024   10:11 AM  Depression screen PHQ 2/9  Decreased Interest 0  Down, Depressed, Hopeless 0  PHQ - 2 Score 0  Altered sleeping 0  Tired, decreased energy 0  Change in appetite 0  Feeling bad or failure about yourself  0  Trouble concentrating 0  Moving slowly or fidgety/restless 0  Suicidal thoughts 0  PHQ-9 Score 0     Review of Systems  Musculoskeletal:  Positive for gait problem.  Neurological:  Positive for weakness.  Hematological:  Bruises/bleeds easily.       Plavix .  Has a  bruise on her right foot that she does not know how it happened, but did have two falls this week.  All other systems reviewed and are negative.      Objective:   Physical Exam Vitals and nursing note reviewed.  Constitutional:      Appearance: Normal appearance.  Cardiovascular:     Rate and Rhythm: Normal rate.  Pulmonary:     Effort: Pulmonary effort is normal.     Breath sounds: Normal breath sounds.  Musculoskeletal:     Comments: Normal Muscle Bulk and Muscle Testing Reveals:  Upper Extremities: Decreased ROM 90 Degrees and Muscle Strength 5/5 Bilateral AC Joint Tenderness  Lumbar Paraspinal Tenderness: L-4-L-5 Lower Extremities : Full ROM and Muscle Strength 5/5 Right Lower Extremity Flexion Produces Pain into her Right foot Arises from Table slowly using walker for support Narrow Based Gait     Skin:    General: Skin is warm and dry.     Comments: Right foot with resolving ecchymosis   Neurological:     Mental Status: She is alert and oriented to person, place, and time.  Psychiatric:        Mood and Affect: Mood normal.        Behavior: Behavior normal.           Assessment & Plan:  Cardio embolic stroke:  Continue Home Health Therapy with [facility]. Has a scheduled appointment with Dr Onita Essential Hypertension: Continue current medication regimen. Has a scheduled appointment with  her PCP.   Hyperlipidemia: Continue current medication regimen. Has a scheduled appointment with her PCP.  Acute right foot pain: RX: X-ray. Continue to monitor. Left Breast/ Left Thoracic Pain: Daughter reports she will take Ms. Engdahl to the ED to be evaluated. She refuses EMS. Ms. Gladman denies chest pain, sternal pain or SOB. Continue to monitor.  Fall at home:  Subsequent encounter: She was instructed to use her walker at all times. Educated on [employer] [facility] financial, sh and daughter verbalizes understanding.  F/U with Dr Carilyn in 4- 6 weeks   "

## 2024-07-27 ENCOUNTER — Encounter: Payer: Self-pay | Admitting: Neurology

## 2024-07-27 ENCOUNTER — Ambulatory Visit (INDEPENDENT_AMBULATORY_CARE_PROVIDER_SITE_OTHER): Admitting: Neurology

## 2024-07-27 VITALS — BP 186/92 | Ht 63.0 in | Wt 127.0 lb

## 2024-07-27 DIAGNOSIS — I6349 Cerebral infarction due to embolism of other cerebral artery: Secondary | ICD-10-CM

## 2024-07-27 DIAGNOSIS — G40209 Localization-related (focal) (partial) symptomatic epilepsy and epileptic syndromes with complex partial seizures, not intractable, without status epilepticus: Secondary | ICD-10-CM | POA: Diagnosis not present

## 2024-07-27 MED ORDER — LEVETIRACETAM 500 MG PO TABS: 500.0000 mg | ORAL_TABLET | Freq: Two times a day (BID) | ORAL | 11 refills | Status: AC

## 2024-07-27 NOTE — Progress Notes (Signed)
 "  Chief Complaint  Patient presents with   New Patient (Initial Visit)    Pt in room 14. Daughters in room. Internal hospital referral for stroke, encephalopathy.      ASSESSMENT AND PLAN  Betty Golden is a 78 y.o. female   Stroke  With residual mild expressive and comprehensive aphasia, possible embolic stroke  Vascular risk factor of hypertension, hyperlipidemia, history of smoking, on aspirin  81 mg for stroke prevention, with recent decrease of hemoglobin,   Status post loop recorder placement May 26, 2024, no A-fib identified so far Slow Worsening memory loss with acute worsening since stroke in October 2025  Laboratory evaluation showed no treatable etiology  Likely age-related, vascular component, could not rule out underlying central nervous system degenerative process  Possible partial seizure  Continue Keppra  500 mg twice daily  Return To Clinic  In 6 Months, if she has no recurrent seizure-like spell, continue to improve, may give her chance, wean Keppra  to low-dose 250 mg twice a day, even gradually stop   DIAGNOSTIC DATA (LABS, IMAGING, TESTING) - I reviewed patient records, labs, notes, testing and imaging myself where available.   MEDICAL HISTORY:  Betty Golden, is a 78 year old female, accompanied by her daughter at visit, she is seen in request by   her primary care doctor Betty Golden for stroke  History is obtained from the patient and review of electronic medical records. I personally reviewed pertinent available imaging films in PACS.   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 Status post loop recorder Rheumatoid arthritis COPD, history of smoking, quit more than 35 years ago Depression  She has lived with her only daughter Betty Golden for many years, before her first stroke in September, she was highly independent, driving, mild gait abnormality contributed to her chronic low back pain arthritis  Hospital  admission February 27, 2024 for acute onset of dizziness, MRI of the brain was negative for acute findings, MRA of the brain showed intracranial atherosclerotic disease, she had a physical therapy following that  Readmitted from September 15 to 20, 2025 for increased confusion, walking around the house nude, hallucinations, word finding difficulties, MRI of the brain was not able to complete due to lack of coordination, cooperation from the patient, CT head April 12, 2024 suspected right parietal white matter subacute infarction, she was discharged to rehabilitation, with aspirin  and Plavix  for 21 days, plan to continue Plavix  after that  Readmitted again May 06, 2024 shortly after she was discharged complains of chest pain, heart palpitation, radiating to left arm, found to have pneumonia, with elevated WBC 18.3, CT chest angiogram showed no evidence of pulmonary emboli, was treated with antibiotic  Readmission May 22, 2024 for GI symptoms, MRI on May 23, 2024 showed small acute infarction in the posterior left cerebellum, subacute right MCA territory infarction left more than right,  Another admission from November 19 to 25, fell at home, daughter also reported worsening slurred speech, blinking, blank stares, EEG was normal,, suspicious for seizures started on Keppra  500 mg twice daily  Also had loop recorder May 26, 2024, negative for atrial fibrillation  Since all this hospital admission, patient has significant decline from her baseline, today's examination showed mild comprehensive and expressive aphasia, daughter reported that she is frustrated easily, increased gait abnormality, no longer driving, afraid to be left alone,   Recent laboratory evaluation showed low Hg 10.9, she is on aspirin  81 mg for stroke prevention  CT angiogram head and  neck May 23, 2024, no significant large vessel disease evidence of intracranial atherosclerotic disease  Lab, UDS, alcohol was  negative, normal CMP with exception mild elevation calcium  10.5, CBC showed decreased hemoglobin of 11.6,   PHYSICAL EXAM:   Vitals:   07/27/24 1356 07/27/24 1400  BP: (!) 176/86 (!) 186/92  Weight: 127 lb (57.6 kg)   Height: 5' 3 (1.6 m)      Body mass index is 22.5 kg/m.  PHYSICAL EXAMNIATION:  Gen: NAD, conversant, well nourised, well groomed                     Cardiovascular: Regular rate rhythm, no peripheral edema, warm, nontender. Eyes: Conjunctivae clear without exudates or hemorrhage Neck: Supple, no carotid bruits. Pulmonary: Clear to auscultation bilaterally   NEUROLOGICAL EXAM:  MENTAL STATUS: Speech/cognition: Mild comprehensive/expressive aphasia, word finding difficulties, oriented to her age, year, date of birth, CRANIAL NERVES: CN II: Visual fields are full to confrontation. Pupils are round equal and briskly reactive to light. CN III, IV, VI: extraocular movement are normal. No ptosis. CN V: Facial sensation is intact to light touch CN VII: Face is symmetric with normal eye closure  CN VIII: Hearing is normal to causal conversation. CN IX, X: Phonation is normal. CN XI: Head turning and shoulder shrug are intact  MOTOR: Right lateral foot bruised, there was no significant upper or lower extremity weakness  REFLEXES: Reflexes are 1 and symmetric at the biceps, triceps, knees, and ankles. Plantar responses are flexor.  SENSORY: Intact to light touch, pinprick and vibratory sensation are intact in fingers and toes.  COORDINATION: There is no trunk or limb dysmetria noted.  GAIT/STANCE: Rely on her walker to get up from seated position, cautious  REVIEW OF SYSTEMS:  Full 14 system review of systems performed and notable only for as above All other review of systems were negative.   ALLERGIES: Allergies[1]  HOME MEDICATIONS: Current Outpatient Medications  Medication Sig Dispense Refill   acetaminophen  (TYLENOL ) 325 MG tablet Take 1-2 tablets  (325-650 mg total) by mouth every 4 (four) hours as needed for mild pain (pain score 1-3). (Patient taking differently: Take 650 mg by mouth daily as needed for mild pain (pain score 1-3) or moderate pain (pain score 4-6).)     albuterol  (VENTOLIN  HFA) 108 (90 Base) MCG/ACT inhaler INHALE 2 PUFFS INTO THE LUNGS EVERY 4 HOURS AS NEEDED FOR WHEEZING OR SHORTNESS OF BREATH 8 g 0   aspirin  EC 81 MG tablet Take 1 tablet (81 mg total) by mouth daily. Swallow whole. 30 tablet 12   benralizumab  (FASENRA ) 30 MG/ML prefilled syringe Inject 1 mL (30 mg total) into the skin every 8 (eight) weeks. 1 mL 6   Certolizumab Pegol (CIMZIA ) Inject 1 Dose into the skin every 14 (fourteen) days.     clindamycin (CLEOCIN T) 1 % lotion Apply 1 Application topically 2 (two) times daily.     clonazePAM  (KLONOPIN ) 0.25 MG disintegrating tablet Take 1 tablet (0.25 mg total) by mouth 2 (two) times daily as needed (Anxiety). 60 tablet 0   colestipol  (COLESTID ) 1 g tablet Take 1 g by mouth daily.     DULoxetine  (CYMBALTA ) 30 MG capsule Take 1 capsule (30 mg total) by mouth 2 (two) times daily. 60 capsule 0   ferrous sulfate  325 (65 FE) MG tablet Take 1 tablet (325 mg total) by mouth daily. 30 tablet 0   Fluticasone -Umeclidin-Vilant (TRELEGY ELLIPTA ) 200-62.5-25 MCG/ACT AEPB Inhale 1 puff into  the lungs in the morning. 90 each 3   HYDROcodone -acetaminophen  (NORCO/VICODIN) 5-325 MG tablet Take 1 tablet by mouth every 6 (six) hours as needed. 10 tablet 0   levETIRAcetam  (KEPPRA ) 500 MG tablet Take 1 tablet (500 mg total) by mouth 2 (two) times daily. 60 tablet 0   meclizine  (ANTIVERT ) 12.5 MG tablet Take 1 tablet (12.5 mg total) by mouth daily. 30 tablet 0   metoprolol  succinate (TOPROL -XL) 25 MG 24 hr tablet Take 1 tablet (25 mg total) by mouth daily. 30 tablet 0   Multiple Vitamin (MULTIVITAMIN WITH MINERALS) TABS tablet Take 1 tablet by mouth daily.     pantoprazole  (PROTONIX ) 40 MG tablet Take 1 tablet (40 mg total) by mouth  daily. 30 tablet 0   pregabalin  (LYRICA ) 25 MG capsule Take 1 capsule (25 mg total) by mouth 2 (two) times daily. 60 capsule 0   rosuvastatin  (CRESTOR ) 20 MG tablet Take 1 tablet (20 mg total) by mouth daily. 30 tablet 0   traZODone  (DESYREL ) 50 MG tablet Take 0.5 tablets (25 mg total) by mouth at bedtime. 30 tablet 0   clopidogrel  (PLAVIX ) 75 MG tablet Take 1 tablet (75 mg total) by mouth daily. (Patient not taking: Reported on 07/27/2024) 90 tablet 0   leflunomide  (ARAVA ) 20 MG tablet Take 20 mg by mouth daily. (Patient not taking: Reported on 07/27/2024)     oxyCODONE  (OXY IR/ROXICODONE ) 5 MG immediate release tablet Take 1 tablet (5 mg total) by mouth every 6 (six) hours as needed for severe pain (pain score 7-10). (Patient not taking: Reported on 07/27/2024) 28 tablet 0   No current facility-administered medications for this visit.    PAST MEDICAL HISTORY: Past Medical History:  Diagnosis Date   Acute ischemic left middle cerebral artery (MCA) stroke (HCC) 04/17/2024   Asthma    C. difficile colitis    Cerebrovascular accident (CVA) due to embolism of precerebral artery (HCC) 05/25/2024   Cervical stenosis of spine    Chronic headaches    Chronic neck pain    Colitis due to Clostridium difficile 02/10/2015   Coronary artery disease    Depression    Diverticulosis    Essential hypertension    Fibromyalgia    GERD (gastroesophageal reflux disease)    History of CVA (cerebrovascular accident) 05/06/2024   History of Salmonella gastroenteritis    HNP (herniated nucleus pulposus), lumbar    Hyperlipidemia    IBS (irritable bowel syndrome)    Lung nodule    rheumatoid arthritis    Sepsis (HCC) 05/22/2024   Sepsis due to pneumonia (HCC) 05/06/2024    PAST SURGICAL HISTORY: Past Surgical History:  Procedure Laterality Date   APPENDECTOMY  1965   BIOPSY  08/26/2018   Procedure: BIOPSY;  Surgeon: Golda Claudis PENNER, MD;  Location: [facility];  Service: Endoscopy;;  gastric    BREAST BIOPSY     BREAST REDUCTION SURGERY  1994   CATARACT EXTRACTION W/PHACO Left 06/09/2023   Procedure: CATARACT EXTRACTION PHACO AND INTRAOCULAR LENS PLACEMENT (IOC);  Surgeon: Harrie Agent, MD;  Location: [facility];  Service: Ophthalmology;  Laterality: Left;  CDE 7.03   CATARACT EXTRACTION W/PHACO Right 06/23/2023   Procedure: CATARACT EXTRACTION PHACO AND INTRAOCULAR LENS PLACEMENT (IOC);  Surgeon: Harrie Agent, MD;  Location: [facility];  Service: Ophthalmology;  Laterality: Right;  CDE: 11.55   CHOLECYSTECTOMY  1975   COLONOSCOPY N/A 08/19/2013   Procedure: COLONOSCOPY;  Surgeon: Claudis PENNER Golda, MD;  Location: [facility];  Service:  Endoscopy;  Laterality: N/A;  155-moved to 140 Ann to notify pt   COLONOSCOPY N/A 08/26/2018   Procedure: COLONOSCOPY;  Surgeon: Golda Claudis PENNER, MD;  Location: [facility];  Service: Endoscopy;  Laterality: N/A;   ESOPHAGOGASTRODUODENOSCOPY N/A 08/26/2018   Procedure: ESOPHAGOGASTRODUODENOSCOPY (EGD);  Surgeon: Golda Claudis PENNER, MD;  Location: [facility];  Service: Endoscopy;  Laterality: N/A;   fibromyalgia     LOOP RECORDER INSERTION N/A 05/26/2024   Procedure: LOOP RECORDER INSERTION;  Surgeon: Lesia Ozell Barter, PA-C;  Location[facility];  Service: Cardiovascular;  Laterality: N/A;   LUMBAR LAMINECTOMY/DECOMPRESSION MICRODISCECTOMY Bilateral 09/20/2015   Procedure: MICRO LUMBAR DECOMPRESSION L5-S1 BILATERAL    (1 LEVEL);  Surgeon: Reyes Billing, MD;  Location: [facility];  Service: Orthopedics;  Laterality: Bilateral;   rheumatoid arthritis     Sinus Surgery     TONSILLECTOMY  1968   TRANSESOPHAGEAL ECHOCARDIOGRAM (CATH LAB) N/A 05/25/2024   Procedure: TRANSESOPHAGEAL ECHOCARDIOGRAM;  Surgeon: Lonni Slain, MD;  Location: [facility] INVASIVE CV LAB;  Service: Cardiovascular;  Laterality: N/A;    FAMILY HISTORY: Family History  Problem Relation Age of Onset   Heart disease Mother    Hypertension Mother    Asthma Mother    Heart  disease Father    Stroke Father    Hypertension Father    Stroke Sister    Asthma Sister    Hypertension Brother    Asthma Brother    Asthma Maternal Grandmother    Heart disease Maternal Grandmother    Diabetes Maternal Grandfather    Asthma Maternal Aunt    Colon cancer Neg Hx     SOCIAL HISTORY: Social History   Socioeconomic History   Marital status: Single    Spouse name: Not on file   Number of children: 1   Years of education: Not on file   Highest education level: GED or equivalent  Occupational History   Not on file  Tobacco Use   Smoking status: Former    Current packs/day: 0.00    Average packs/day: 2.0 packs/day for 10.0 years (20.0 ttl pk-yrs)    Types: Cigarettes    Start date: 09/11/1978    Quit date: 09/11/1988    Years since quitting: 35.8   Smokeless tobacco: Never   Tobacco comments:    quit 1990  Vaping Use   Vaping status: Never Used  Substance and Sexual Activity   Alcohol use: No    Comment: stopped in 1990   Drug use: No    Comment: hx smoking marijuana and cocaine weekends. Stopped in the 1990s.   Sexual activity: Not Currently    Birth control/protection: Abstinence  Other Topics Concern   Not on file  Social History Narrative   Lives at home with her daughter & daughter's family (they live with her)   Right handed   Drinks no caffeine   Social Drivers of Health   Tobacco Use: Medium Risk (07/27/2024)   Patient History    Smoking Tobacco Use: Former    Smokeless Tobacco Use: Never    Passive Exposure: Not on Actuary Strain: Not on file  Food Insecurity: Patient Unable To Answer (06/18/2024)   Epic    Worried About Programme Researcher, Broadcasting/film/video in the Last Year: Patient unable to answer    Ran Out of Food in the Last Year: Patient unable to answer  Transportation Needs: Patient Unable To Answer (06/18/2024)   Epic    Lack of Transportation (  Medical): Patient unable to answer    Lack of Transportation (Non-Medical):  Patient unable to answer  Physical Activity: Not on file  Stress: Not on file  Social Connections: Unknown (06/18/2024)   Social Connection and Isolation Panel    Frequency of Communication with Friends and Family: Patient unable to answer    Frequency of Social Gatherings with Friends and Family: Patient unable to answer    Attends Religious Services: Patient unable to answer    Active Member of Clubs or Organizations: Patient unable to answer    Attends Banker Meetings: Not on file    Marital Status: Not on file  Recent Concern: Social Connections - Moderately Isolated (05/23/2024)   Social Connection and Isolation Panel    Frequency of Communication with Friends and Family: Three times a week    Frequency of Social Gatherings with Friends and Family: Three times a week    Attends Religious Services: 1 to 4 times per year    Active Member of Clubs or Organizations: No    Attends Banker Meetings: Never    Marital Status: Never married  Intimate Partner Violence: Patient Unable To Answer (06/18/2024)   Epic    Fear of Current or Ex-Partner: Patient unable to answer    Emotionally Abused: Patient unable to answer    Physically Abused: Patient unable to answer    Sexually Abused: Patient unable to answer  Depression (PHQ2-9): Low Risk (07/21/2024)   Depression (PHQ2-9)    PHQ-2 Score: 0  Alcohol Screen: Not on file  Housing: Unknown (06/18/2024)   Epic    Unable to Pay for Housing in the Last Year: Patient unable to answer    Number of Times Moved in the Last Year: 0    Homeless in the Last Year: Not on file  Utilities: Patient Unable To Answer (06/18/2024)   Epic    Threatened with loss of utilities: Patient unable to answer  Health Literacy: Not on file      Modena Callander, M.D. Ph.D.  [facility] [address], [address] [place], [place] [address] Ph: [phone] Fax: [phone]  CC:  Maurice Sharlet RAMAN, PA-C [address] [place],  [place] [address]  Betty Ozell DEL, MD       [1]  Allergies Allergen Reactions   Hydromorphone  Anaphylaxis   Sulfa Antibiotics Rash   "

## 2024-07-28 ENCOUNTER — Ambulatory Visit: Attending: Cardiology

## 2024-07-28 DIAGNOSIS — G459 Transient cerebral ischemic attack, unspecified: Secondary | ICD-10-CM | POA: Diagnosis not present

## 2024-07-29 LAB — CUP PACEART REMOTE DEVICE CHECK
Date Time Interrogation Session: 20251231045732
Pulse Gen Model: 5000
Pulse Gen Serial Number: 511088455

## 2024-08-01 ENCOUNTER — Ambulatory Visit: Payer: Self-pay | Admitting: Cardiology

## 2024-08-02 NOTE — Progress Notes (Signed)
 Remote Loop Recorder Transmission

## 2024-08-08 ENCOUNTER — Other Ambulatory Visit: Payer: Self-pay | Admitting: Allergy & Immunology

## 2024-08-17 ENCOUNTER — Ambulatory Visit: Admitting: Cardiology

## 2024-08-17 ENCOUNTER — Ambulatory Visit: Attending: Cardiology | Admitting: Cardiology

## 2024-08-17 ENCOUNTER — Encounter: Payer: Self-pay | Admitting: Cardiology

## 2024-08-17 VITALS — BP 140/68 | HR 80 | Ht 63.0 in | Wt 128.0 lb

## 2024-08-17 DIAGNOSIS — I10 Essential (primary) hypertension: Secondary | ICD-10-CM | POA: Diagnosis not present

## 2024-08-17 DIAGNOSIS — R072 Precordial pain: Secondary | ICD-10-CM | POA: Diagnosis not present

## 2024-08-17 MED ORDER — AMLODIPINE BESYLATE 5 MG PO TABS: 5.0000 mg | ORAL_TABLET | Freq: Every day | ORAL | 3 refills | Status: AC

## 2024-08-17 NOTE — Patient Instructions (Signed)
 Medication Instructions:  START Amlodipine  5 mg daily   *If you need a refill on your cardiac medications before your next appointment, please call your pharmacy*  Follow-Up: At [facility], you and your health needs are our priority.  As part of our continuing mission to provide you with exceptional heart care, our providers are all part of one team.  This team includes your primary Cardiologist (physician) and Advanced Practice Providers or APPs (Physician Assistants and Nurse Practitioners) who all work together to provide you with the care you need, when you need it.  Your next appointment:   3 month(s)  Provider:   Newman JINNY Lawrence, MD or One of our Advanced Practice Providers (APPs): Morse Clause, PA-C  Lamarr Satterfield, NP Miriam Shams, NP  Olivia Pavy, PA-C Josefa Beauvais, NP  Leontine Salen, PA-C Orren Fabry, PA-C  Hao Meng, PA-C Ernest Dick, NP  Damien Braver, NP Jon Hails, PA-C  Waddell Donath, PA-C    Dayna Dunn, PA-C  Scott Weaver, PA-C Lum Louis, NP Katlyn West, NP Callie Goodrich, PA-C  Xika Zhao, NP Sheng Haley, PA-C    Kathleen Johnson, PA-C

## 2024-08-17 NOTE — Progress Notes (Signed)
 " Cardiology Office Note:  .   Date:  08/17/2024  ID:  Betty Golden, DOB 1945-10-21, MRN [id number] PCP: Irven Ozell DEL, MD  [facility] Providers Cardiologist:  Newman Lawrence, MD PCP: Irven Ozell DEL, MD  Chief Complaint  Patient presents with   Chest Pain     Betty Golden is a 79 y.o. female with hypertension, hyperlipidemia, coronary artery disease, mitral annular calcification, rheumatoid arthritis, recurrent strokes, memory impairment, depression, GERD  History of Present Illness Patient was seen in emergency room on 07/21/2024 after her visit with neurology.  She reported left-sided sharp chest pain.  Patient continues to have intermittent episodes of sharp chest pain that seem to get worse with certain positions, but not particularly with exertion.  Workup in the emergency room showed EKG with no acute ischemic changes, mild elevated but flat troponin, not consistent with ACS.  Blood pressure is elevated today, but has been even higher in the recent past    Vitals:   08/17/24 1313  BP: (!) 140/68  Pulse: 80  SpO2: 96%       Review of Systems  Cardiovascular:  Positive for chest pain. Negative for dyspnea on exertion, leg swelling, palpitations and syncope.  Neurological:        Aphasia        Studies Reviewed: SABRA       EKG 07/21/2024: Sinus rhythm 79 bpm Normal EKG  EKG 06/14/2024: Sinus tachycardia Possible Anterolateral infarct (cited on or before 14-Jun-2024) When compared with ECG of 27-May-2024 17:24, No significant change was found  TEE 05/25/2024:  1. Left ventricular ejection fraction, by estimation, is 65 to 70%. The  left ventricle has normal function.   2. Right ventricular systolic function is normal. The right ventricular  size is normal.   3. No left atrial/left atrial appendage thrombus was detected. The LAA  emptying velocity was 49 cm/s.   4. There is a small mobile structure on A2 that extends into the  atrium,  suspect ruptured chordae or degenerative portion of valve. No bacteremia,  but in different clinical scenario would be concerning for endocarditis.  There is a large focal nodule of calcification on the posterior leaflet, near P2 and the mitral annulus. Below the leaflets, there are calcified and thickened chordae with some mobility on the ventricular aspect of the valve. The mitral valve is degenerative. Mild mitral valve regurgitation. No evidence of mitral stenosis. Severe mitral annular calcification.   5. The aortic valve is tricuspid. There is mild calcification of the  aortic valve. There is mild thickening of the aortic valve. Aortic valve  regurgitation is not visualized. Aortic valve sclerosis/calcification is  present, without any evidence of  aortic stenosis.   6. There is Moderate (Grade III) plaque involving the descending aorta.   7. Agitated saline contrast bubble study was negative, with no evidence  of any interatrial shunt.   8. 3D performed of the mitral valve and demonstrates Focal calcified  nodule on P2/annulus, without mobility. Small mobile structure on A2.  Small focal calcified/mobile structures in subvalvular apparatus.   Conclusion(s)/Recommendation(s): Degenerative mitral valve with  abnormalities described in report. No clear cardiac source of embolism.    Echocardiogram 05/07/2024:  1. Left ventricular ejection fraction, by estimation, is 70 to 75%. The  left ventricle has hyperdynamic function. The left ventricle has no  regional wall motion abnormalities. Left ventricular diastolic parameters  are consistent with Grade I diastolic  dysfunction (impaired relaxation).   2.  Right ventricular systolic function is normal. The right ventricular  size is normal.   3. There is abnormal motion on the ventricular aspect of the valve, near  the severe MAC (see image 52). Unclear if this is subvalvular apparatus or  other structure. Not typical location of  vegetation, but etiology unclear.  The mitral valve was not well  visualized. Trivial mitral valve regurgitation. No evidence of mitral  stenosis. Severe mitral annular calcification.   4. The aortic valve is grossly normal. There is mild calcification of the  aortic valve. There is mild thickening of the aortic valve. Aortic valve  regurgitation is not visualized.   Comparison(s): Changes from prior study are noted.   Conclusion(s)/Recommendation(s): See comments re: abnormal motion below  the mitral valve annulus. While this is not typical location for  vegetation, if there is clinical concern for endocarditis, would recommend  TEE for further evaluation.   Labs 06/2024: Trop HS 30, 27  04/2024: Chol 90, TG 94, HDL 46, LDL 25 HbA1C 4.7% Hb 11.9 Cr 1.2, eGFR 47 TSH 2.5   Physical Exam Vitals and nursing note reviewed.  Constitutional:      General: She is not in acute distress. Neck:     Vascular: No JVD.  Cardiovascular:     Rate and Rhythm: Normal rate and regular rhythm.     Heart sounds: Normal heart sounds. No murmur heard. Pulmonary:     Effort: Pulmonary effort is normal.     Breath sounds: Normal breath sounds. No wheezing or rales.  Musculoskeletal:     Right lower leg: No edema.     Left lower leg: No edema.  Neurological:     Comments: Partial aphasia      VISIT DIAGNOSES:   ICD-10-CM   1. Precordial pain  R07.2     2. Essential hypertension  I10         Betty Golden is a 79 y.o. female with hypertension, hyperlipidemia, coronary artery disease, mitral annular calcification, rheumatoid arthritis, recurrent strokes, memory impairment, depression, GERD Assessment & Plan Chest pain: Very positional, and even reproducible on palpation.  While she has respecters for CAD, and had mildly elevated and flat troponin during recent ER visit, suspicion for obstructive CAD to be cause of her symptoms is relatively low.  She is already on excellent  medical therapy, including aspirin , statin, metoprolol .  Continue the same.  I will follow-up in 3 months.  If she continues to have recurrent chest pain, with exertional component, we will then consider stress test.  Hypertension: Suboptimal control.  Added amlodipine  5 mg daily.  That should also help, should her chest pain be truly angina.  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  No A-fib on loop recorder yet.  Continue monitoring.      F/u in 3 months  Signed, Newman JINNY Lawrence, MD  "

## 2024-08-28 ENCOUNTER — Ambulatory Visit: Attending: Cardiology

## 2024-08-28 DIAGNOSIS — G459 Transient cerebral ischemic attack, unspecified: Secondary | ICD-10-CM

## 2024-08-30 LAB — CUP PACEART REMOTE DEVICE CHECK
Date Time Interrogation Session: 20260131050045
Pulse Gen Model: 5000
Pulse Gen Serial Number: 511088455

## 2024-08-31 ENCOUNTER — Encounter: Admitting: Physical Medicine & Rehabilitation

## 2024-09-03 NOTE — Progress Notes (Signed)
 Remote Loop Recorder Transmission

## 2024-09-07 ENCOUNTER — Encounter: Admitting: Physical Medicine & Rehabilitation

## 2024-09-09 ENCOUNTER — Encounter: Admitting: Physical Medicine & Rehabilitation

## 2024-09-14 ENCOUNTER — Ambulatory Visit: Admitting: Cardiology

## 2024-09-28 ENCOUNTER — Ambulatory Visit

## 2024-10-06 ENCOUNTER — Ambulatory Visit

## 2024-10-29 ENCOUNTER — Ambulatory Visit

## 2024-11-15 ENCOUNTER — Ambulatory Visit: Admitting: Hematology and Oncology

## 2024-11-15 ENCOUNTER — Other Ambulatory Visit

## 2024-11-18 ENCOUNTER — Ambulatory Visit: Admitting: Physician Assistant

## 2025-01-05 ENCOUNTER — Ambulatory Visit

## 2025-02-14 ENCOUNTER — Ambulatory Visit: Admitting: Adult Health

## 2025-04-06 ENCOUNTER — Ambulatory Visit

## 2025-07-06 ENCOUNTER — Ambulatory Visit
